# Patient Record
Sex: Male | Born: 1945 | Race: White | Hispanic: No | Smoking: Former smoker
Health system: Southern US, Community
[De-identification: ages and names within clinical notes are randomized; demographics above are authoritative.]

## PROBLEM LIST (undated history)

## (undated) DIAGNOSIS — K649 Unspecified hemorrhoids: Secondary | ICD-10-CM

## (undated) DIAGNOSIS — Z95828 Presence of other vascular implants and grafts: Secondary | ICD-10-CM

## (undated) DIAGNOSIS — I2510 Atherosclerotic heart disease of native coronary artery without angina pectoris: Secondary | ICD-10-CM

## (undated) DIAGNOSIS — N200 Calculus of kidney: Secondary | ICD-10-CM

## (undated) DIAGNOSIS — M4800 Spinal stenosis, site unspecified: Secondary | ICD-10-CM

## (undated) DIAGNOSIS — F411 Generalized anxiety disorder: Secondary | ICD-10-CM

## (undated) DIAGNOSIS — Z87442 Personal history of urinary calculi: Secondary | ICD-10-CM

## (undated) DIAGNOSIS — D649 Anemia, unspecified: Secondary | ICD-10-CM

## (undated) DIAGNOSIS — K56609 Unspecified intestinal obstruction, unspecified as to partial versus complete obstruction: Secondary | ICD-10-CM

## (undated) DIAGNOSIS — M1990 Unspecified osteoarthritis, unspecified site: Secondary | ICD-10-CM

## (undated) DIAGNOSIS — C4490 Unspecified malignant neoplasm of skin, unspecified: Secondary | ICD-10-CM

## (undated) DIAGNOSIS — Z85828 Personal history of other malignant neoplasm of skin: Secondary | ICD-10-CM

## (undated) DIAGNOSIS — N401 Enlarged prostate with lower urinary tract symptoms: Secondary | ICD-10-CM

## (undated) DIAGNOSIS — Z87898 Personal history of other specified conditions: Secondary | ICD-10-CM

## (undated) DIAGNOSIS — J439 Emphysema, unspecified: Secondary | ICD-10-CM

## (undated) DIAGNOSIS — C3490 Malignant neoplasm of unspecified part of unspecified bronchus or lung: Secondary | ICD-10-CM

## (undated) DIAGNOSIS — Z9889 Other specified postprocedural states: Secondary | ICD-10-CM

## (undated) DIAGNOSIS — K5909 Other constipation: Secondary | ICD-10-CM

## (undated) DIAGNOSIS — Z9221 Personal history of antineoplastic chemotherapy: Secondary | ICD-10-CM

## (undated) DIAGNOSIS — R9439 Abnormal result of other cardiovascular function study: Secondary | ICD-10-CM

## (undated) DIAGNOSIS — K5730 Diverticulosis of large intestine without perforation or abscess without bleeding: Secondary | ICD-10-CM

## (undated) DIAGNOSIS — R079 Chest pain, unspecified: Secondary | ICD-10-CM

## (undated) DIAGNOSIS — G4730 Sleep apnea, unspecified: Secondary | ICD-10-CM

## (undated) DIAGNOSIS — Z860101 Personal history of adenomatous and serrated colon polyps: Secondary | ICD-10-CM

## (undated) DIAGNOSIS — K219 Gastro-esophageal reflux disease without esophagitis: Secondary | ICD-10-CM

## (undated) DIAGNOSIS — D509 Iron deficiency anemia, unspecified: Secondary | ICD-10-CM

## (undated) DIAGNOSIS — G4733 Obstructive sleep apnea (adult) (pediatric): Secondary | ICD-10-CM

## (undated) DIAGNOSIS — I10 Essential (primary) hypertension: Secondary | ICD-10-CM

## (undated) DIAGNOSIS — Z8739 Personal history of other diseases of the musculoskeletal system and connective tissue: Secondary | ICD-10-CM

## (undated) DIAGNOSIS — E785 Hyperlipidemia, unspecified: Secondary | ICD-10-CM

## (undated) DIAGNOSIS — R42 Dizziness and giddiness: Secondary | ICD-10-CM

## (undated) DIAGNOSIS — Z86010 Personal history of colonic polyps: Secondary | ICD-10-CM

## (undated) DIAGNOSIS — Z923 Personal history of irradiation: Secondary | ICD-10-CM

## (undated) HISTORY — PX: KNEE CARTILAGE SURGERY: SHX688

## (undated) HISTORY — DX: Chest pain, unspecified: R07.9

## (undated) HISTORY — PX: SKIN CANCER EXCISION: SHX779

## (undated) HISTORY — DX: Atherosclerotic heart disease of native coronary artery without angina pectoris: I25.10

## (undated) HISTORY — PX: CATARACT EXTRACTION W/ INTRAOCULAR LENS  IMPLANT, BILATERAL: SHX1307

## (undated) HISTORY — PX: BELPHAROPTOSIS REPAIR: SHX369

## (undated) HISTORY — DX: Unspecified malignant neoplasm of skin, unspecified: C44.90

## (undated) HISTORY — PX: PROSTATE BIOPSY: SHX241

## (undated) HISTORY — DX: Dizziness and giddiness: R42

## (undated) HISTORY — DX: Unspecified hemorrhoids: K64.9

## (undated) HISTORY — PX: TONSILLECTOMY: SUR1361

## (undated) HISTORY — DX: Abnormal result of other cardiovascular function study: R94.39

## (undated) HISTORY — DX: Unspecified intestinal obstruction, unspecified as to partial versus complete obstruction: K56.609

## (undated) HISTORY — PX: CARDIAC CATHETERIZATION: SHX172

## (undated) HISTORY — DX: Essential (primary) hypertension: I10

## (undated) HISTORY — DX: Anemia, unspecified: D64.9

## (undated) HISTORY — DX: Hyperlipidemia, unspecified: E78.5

## (undated) HISTORY — DX: Spinal stenosis, site unspecified: M48.00

---

## 1993-09-10 HISTORY — PX: KNEE ARTHROSCOPY W/ MENISCAL REPAIR: SHX1877

## 1999-10-04 ENCOUNTER — Encounter: Admission: RE | Admit: 1999-10-04 | Discharge: 1999-10-04 | Payer: Self-pay | Admitting: Specialist

## 1999-10-04 ENCOUNTER — Encounter: Payer: Self-pay | Admitting: Specialist

## 1999-10-05 ENCOUNTER — Encounter: Admission: RE | Admit: 1999-10-05 | Discharge: 1999-10-05 | Payer: Self-pay | Admitting: Specialist

## 1999-10-05 ENCOUNTER — Encounter: Payer: Self-pay | Admitting: Specialist

## 2002-09-10 HISTORY — PX: CATARACT EXTRACTION W/ INTRAOCULAR LENS  IMPLANT, BILATERAL: SHX1307

## 2004-09-13 ENCOUNTER — Ambulatory Visit: Payer: Self-pay | Admitting: Internal Medicine

## 2004-09-25 ENCOUNTER — Ambulatory Visit: Payer: Self-pay | Admitting: Internal Medicine

## 2004-10-06 ENCOUNTER — Ambulatory Visit: Payer: Self-pay | Admitting: Family Medicine

## 2004-10-20 ENCOUNTER — Ambulatory Visit (HOSPITAL_COMMUNITY): Admission: RE | Admit: 2004-10-20 | Discharge: 2004-10-20 | Payer: Self-pay | Admitting: Orthopedic Surgery

## 2005-02-01 ENCOUNTER — Ambulatory Visit: Payer: Self-pay | Admitting: Family Medicine

## 2005-02-06 ENCOUNTER — Ambulatory Visit (HOSPITAL_COMMUNITY): Admission: RE | Admit: 2005-02-06 | Discharge: 2005-02-06 | Payer: Self-pay | Admitting: Family Medicine

## 2005-11-08 HISTORY — PX: ELBOW BURSA SURGERY: SHX615

## 2006-06-06 ENCOUNTER — Encounter (INDEPENDENT_AMBULATORY_CARE_PROVIDER_SITE_OTHER): Payer: Self-pay | Admitting: Specialist

## 2006-06-06 ENCOUNTER — Ambulatory Visit (HOSPITAL_BASED_OUTPATIENT_CLINIC_OR_DEPARTMENT_OTHER): Admission: RE | Admit: 2006-06-06 | Discharge: 2006-06-06 | Payer: Self-pay | Admitting: Orthopedic Surgery

## 2006-06-06 HISTORY — PX: OLECRANON BURSECTOMY: SHX2097

## 2007-08-20 ENCOUNTER — Encounter: Admission: RE | Admit: 2007-08-20 | Discharge: 2007-08-20 | Payer: Self-pay | Admitting: Family Medicine

## 2009-02-23 ENCOUNTER — Encounter: Payer: Self-pay | Admitting: Cardiology

## 2009-03-28 ENCOUNTER — Ambulatory Visit: Payer: Self-pay | Admitting: Cardiology

## 2009-03-28 DIAGNOSIS — R0989 Other specified symptoms and signs involving the circulatory and respiratory systems: Secondary | ICD-10-CM

## 2009-03-28 DIAGNOSIS — I10 Essential (primary) hypertension: Secondary | ICD-10-CM | POA: Insufficient documentation

## 2009-03-28 DIAGNOSIS — R0609 Other forms of dyspnea: Secondary | ICD-10-CM

## 2009-03-28 DIAGNOSIS — E669 Obesity, unspecified: Secondary | ICD-10-CM

## 2009-03-28 DIAGNOSIS — R002 Palpitations: Secondary | ICD-10-CM

## 2009-03-28 HISTORY — DX: Obesity, unspecified: E66.9

## 2009-03-28 HISTORY — DX: Other specified symptoms and signs involving the circulatory and respiratory systems: R09.89

## 2009-03-28 HISTORY — DX: Other forms of dyspnea: R06.09

## 2009-03-28 HISTORY — DX: Palpitations: R00.2

## 2010-02-17 ENCOUNTER — Ambulatory Visit (HOSPITAL_BASED_OUTPATIENT_CLINIC_OR_DEPARTMENT_OTHER): Admission: RE | Admit: 2010-02-17 | Discharge: 2010-02-18 | Payer: Self-pay | Admitting: Urology

## 2010-02-17 HISTORY — PX: TRANSURETHRAL RESECTION OF BLADDER TUMOR: SHX2575

## 2010-11-23 ENCOUNTER — Encounter (INDEPENDENT_AMBULATORY_CARE_PROVIDER_SITE_OTHER): Payer: Self-pay | Admitting: *Deleted

## 2010-11-28 NOTE — Letter (Signed)
Summary: New Patient letter  [facility]  [address] [place], [place] [address]   Phone: [phone]  Fax: [phone]       11/23/2010 MRN: [id number]  Travis Padilla [address] [place], [place]  [address]  Dear Mr. Travis Padilla,  Welcome to the Gastroenterology Division at [facility].    You are scheduled to see Dr.  Russella Dar on 01-04-11 at 10:00A.M. on the 3rd floor at [facility], [address]  We ask that you try to arrive at our office 15 minutes prior to your appointment time to allow for check-in.  We would like you to complete the enclosed self-administered evaluation form prior to your visit and bring it with you on the day of your appointment.  We will review it with you.  Also, please bring a complete list of all your medications or, if you prefer, bring the medication bottles and we will list them.  Please bring your insurance card so that we may make a copy of it.  If your insurance requires a referral to see a specialist, please bring your referral form from your primary care physician.  Co-payments are due at the time of your visit and may be paid by cash, check or credit card.     Your office visit will consist of a consult with your physician (includes a physical exam), any laboratory testing he/she may order, scheduling of any necessary diagnostic testing (e.g. x-ray, ultrasound, CT-scan), and scheduling of a procedure (e.g. Endoscopy, Colonoscopy) if required.  Please allow enough time on your schedule to allow for any/all of these possibilities.    If you cannot keep your appointment, please call [phone] to cancel or reschedule prior to your appointment date.  This allows [place] the opportunity to schedule an appointment for another patient in need of care.  If you do not cancel or reschedule by 5 p.m. the business day prior to your appointment date, you will be charged a $50.00 late cancellation/no-show fee.    Thank you for choosing [facility] for your medical needs.  We appreciate the opportunity to care for you.  Please visit [place] at our website  to learn more about our practice.                     Sincerely,                                                             The Gastroenterology Division

## 2011-01-04 ENCOUNTER — Ambulatory Visit: Payer: Self-pay | Admitting: Gastroenterology

## 2011-01-26 NOTE — Op Note (Signed)
NAME:  Travis Padilla, Travis Padilla NO.:  [id number]   MEDICAL RECORD NO.:  [id number]          PATIENT TYPE:  AMB   LOCATION:  [facility]                         FACILITY:  [facility]   PHYSICIAN:  Deidre Ala, M.D.    DATE OF BIRTH:  1945-12-29   DATE OF PROCEDURE:  06/06/2006  DATE OF DISCHARGE:                                 OPERATIVE REPORT   PREOPERATIVE DIAGNOSIS:  Recurrent large left elbow olecranon bursitis.   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   PROCEDURE:  Revision olecranon bursectomy, radical, of left elbow with  placement of through-to-the-skin mattress sutures to the fascia and closed  over a Jackson-Pratt drain.   SURGEON:  Doristine Section, M.D.   ASSISTANT:  Clarene Reamer, P.A.-C   ANESTHESIA:  General endotracheal.   CULTURES:  Taken of fluid.   SPECIMENS:  Bursal sac for analysis and crystals.   DRAINS:  Jackson-Pratt drain to self-suction.   ESTIMATED BLOOD LOSS:  Minimal, less than 100 mL.   REPLACEMENT:  Without.   TOURNIQUET TIME:  One hour 25 minutes.   PATHOLOGIC FINDINGS AND HISTORY:  Travis Padilla is a [age] year old  male who was sent for consultation by Madilyn Fireman, P.A. at [facility] and Dr. Louanna Raw.  He had had the frustratingly accumulation  of a bursal sac, left elbow, following previous bursal excisions about 6  months prior by another orthopedic surgeon in [place].  There was some  question about infection.  He was seen aspirated and there was no sign of  gross infection.  In any case, the patient does have a history of gout.  We  discussed with the patient the issues involved and felt that he needed a  recurrent excision with some sort of a tack-down procedure, as I have used  in the past, which has eliminated significant recurrence long-term.  At  surgery, we found is a thick, fairly aggressive-looking bursal sac that was  quite large,  measuring about 5 x 6-7 cm.  We excised it completely down to  the fascia and just under the subcutaneous layer.  We removed some gouty  tophi and also placed through-and-through tacking stitches of 2-0 Vicryl  from the skin to the to the fascia, completely surrounding the bursa, and  also centrally, and wove a Jackson-Pratt drain in horseshoe shape from  medial-distal and then around the olecranon and back proximal-lateral and  out through a separate stab hole to self-suction.   PROCEDURE:  With adequate anesthesia obtained using LMA technique, 1 g of  Ancef given IV prophylaxis, the patient was placed in the supine position.  The left upper extremity was prepped from the fingertips to the upper arm in  the standard fashion.  After standard prepping and draping, Esmarch  exsanguination was used.  The tourniquet was let up to 250 mmHg.  The old  incision was then incised, incision deepened sharply with a knife and  hemostasis obtained using the Bovie electrocoagulator.  About 20 mL of  bloody fluid were then removed.  I then did a meticulous dissection under  loupe magnification of the bursa with primarily scissors and then  needlepoint Bovie on cutting cautery.  I resected the entire bursal sac,  making sure that I was away from the ulnar nerve medially and fully removed  the bursal sac down to the fascia.  Bleeding points were cauterized.  I then  progressively closed the skin down to the fascia with a 2-0 Vicryl through-  and-through stitch from the skin to the fascia, back up through the skin out  as a small horizontal mattress suture.  When I had completed this, the  Hemovac drain was woven through it and then another row on either side of  the wound of the through-and-through 2-0 Vicryl stitches and then skin  staples.  JP drain was hooked up to self-suction.  A bulky sterile  compressive dressing was applied with a sling.  The patient, having  tolerated procedure well, was awakened  and taken to recovery room in  satisfactory condition for routine postoperative care, for followup on  Monday.           ______________________________  V. Charlesetta Shanks, M.D.     VEP/MEDQ  D:  06/06/2006  T:  06/08/2006  Job:  [id number]   cc:   [facility] Jalene Mullet  Fax: [phone]

## 2011-02-12 ENCOUNTER — Encounter: Payer: Self-pay | Admitting: Physician Assistant

## 2011-09-11 DIAGNOSIS — I2510 Atherosclerotic heart disease of native coronary artery without angina pectoris: Secondary | ICD-10-CM

## 2011-09-11 HISTORY — DX: Atherosclerotic heart disease of native coronary artery without angina pectoris: I25.10

## 2012-06-11 DIAGNOSIS — M23359 Other meniscus derangements, posterior horn of lateral meniscus, unspecified knee: Secondary | ICD-10-CM | POA: Diagnosis not present

## 2012-06-11 DIAGNOSIS — M23302 Other meniscus derangements, unspecified lateral meniscus, unspecified knee: Secondary | ICD-10-CM | POA: Diagnosis not present

## 2012-06-11 DIAGNOSIS — M23305 Other meniscus derangements, unspecified medial meniscus, unspecified knee: Secondary | ICD-10-CM | POA: Diagnosis not present

## 2012-06-11 DIAGNOSIS — M2240 Chondromalacia patellae, unspecified knee: Secondary | ICD-10-CM | POA: Diagnosis not present

## 2012-06-11 DIAGNOSIS — M779 Enthesopathy, unspecified: Secondary | ICD-10-CM | POA: Diagnosis not present

## 2012-07-21 DIAGNOSIS — Z0000 Encounter for general adult medical examination without abnormal findings: Secondary | ICD-10-CM | POA: Diagnosis not present

## 2012-07-21 DIAGNOSIS — K589 Irritable bowel syndrome without diarrhea: Secondary | ICD-10-CM | POA: Diagnosis not present

## 2012-07-21 DIAGNOSIS — I10 Essential (primary) hypertension: Secondary | ICD-10-CM | POA: Diagnosis not present

## 2012-07-21 DIAGNOSIS — E785 Hyperlipidemia, unspecified: Secondary | ICD-10-CM | POA: Diagnosis not present

## 2012-07-21 DIAGNOSIS — Z23 Encounter for immunization: Secondary | ICD-10-CM | POA: Diagnosis not present

## 2012-07-21 DIAGNOSIS — M109 Gout, unspecified: Secondary | ICD-10-CM | POA: Diagnosis not present

## 2012-07-21 DIAGNOSIS — Z125 Encounter for screening for malignant neoplasm of prostate: Secondary | ICD-10-CM | POA: Diagnosis not present

## 2012-08-14 ENCOUNTER — Encounter: Payer: Self-pay | Admitting: Cardiology

## 2012-08-14 ENCOUNTER — Ambulatory Visit (INDEPENDENT_AMBULATORY_CARE_PROVIDER_SITE_OTHER): Payer: Medicare Other | Admitting: Physician Assistant

## 2012-08-14 DIAGNOSIS — R9439 Abnormal result of other cardiovascular function study: Secondary | ICD-10-CM

## 2012-08-14 DIAGNOSIS — R079 Chest pain, unspecified: Secondary | ICD-10-CM

## 2012-08-14 NOTE — Progress Notes (Signed)
Travis Padilla is a 66 y.o. male referred by his PCP for ETT.  He has no hx of CAD.  PMH:  HTN, HL.  SHx: ex-smoker.  FHx:  + CAD (father with CABG age 74).  Patient notes hx of occasional chest pain and L arm pain. Has noted pain with exertion.  Denies any recent exertional chest pain.  No significant dyspnea.  No syncope.  Exam unremarkable.   Exercise Treadmill Test  Pre-Exercise Testing Evaluation Rhythm: normal sinus  Rate: 78                 Test  Exercise Tolerance Test Ordering MD: Rudi Heap, MD  Interpreting MD: Tereso Newcomer, PA-C  Unique Test No: 1  Treadmill:  1  Indication for ETT: chest pain - rule out ischemia  Contraindication to ETT: No   Stress Modality: exercise - treadmill  Cardiac Imaging Performed: non   Protocol: standard Bruce - maximal  Max BP:  223/93  Max MPHR (bpm):  155 85% MPR (bpm):  132  MPHR obtained (bpm):  144 % MPHR obtained:  92  Reached 85% MPHR (min:sec):  3:50 Total Exercise Time (min-sec):  5:00  Workload in METS:  7.0 Borg Scale: 13  Reason ETT Terminated:  exaggerated hypertensive response    ST Segment Analysis At Rest: normal ST segments - no evidence of significant ST depression With Exercise: borderline ST changes  Other Information Arrhythmia:  No Angina during ETT:  present (1) Quality of ETT:  indeterminate  ETT Interpretation:  borderline (indeterminate) with non-specific ST changes  Comments: Fair exercise tolerance. There was chest pain.  He also noted knee pain. Exaggerated BP response to exercise. Subtle borderline ST changes in V4-5. Cannot rule out ischemia.   Recommendations: ETT is clinically + and Electrically equivocal. Recommend Lexiscan Myoview. Discussed with patient. He agrees to proceed. We will schedule a Lexiscan (low level stress) Myoview. Luna Glasgow, PA-C  12:48 PM 08/14/2012

## 2012-08-14 NOTE — Patient Instructions (Addendum)
Your physician has requested that you have a lexiscan myoview. For further information please visit [email or web address]. Please follow instruction sheet, as given.   

## 2012-08-19 ENCOUNTER — Encounter (HOSPITAL_COMMUNITY): Payer: Managed Care, Other (non HMO)

## 2012-08-21 ENCOUNTER — Telehealth: Payer: Self-pay | Admitting: *Deleted

## 2012-08-21 ENCOUNTER — Ambulatory Visit (INDEPENDENT_AMBULATORY_CARE_PROVIDER_SITE_OTHER): Payer: Medicare Other | Admitting: Cardiology

## 2012-08-21 ENCOUNTER — Ambulatory Visit (HOSPITAL_COMMUNITY): Payer: Medicare Other | Attending: Internal Medicine | Admitting: Radiology

## 2012-08-21 ENCOUNTER — Encounter: Payer: Self-pay | Admitting: Cardiology

## 2012-08-21 VITALS — BP 132/82 | Ht 74.0 in | Wt 246.0 lb

## 2012-08-21 VITALS — BP 126/72 | HR 62 | Resp 12

## 2012-08-21 DIAGNOSIS — I999 Unspecified disorder of circulatory system: Secondary | ICD-10-CM | POA: Insufficient documentation

## 2012-08-21 DIAGNOSIS — R9439 Abnormal result of other cardiovascular function study: Secondary | ICD-10-CM | POA: Insufficient documentation

## 2012-08-21 DIAGNOSIS — Z8249 Family history of ischemic heart disease and other diseases of the circulatory system: Secondary | ICD-10-CM | POA: Insufficient documentation

## 2012-08-21 DIAGNOSIS — R079 Chest pain, unspecified: Secondary | ICD-10-CM

## 2012-08-21 DIAGNOSIS — I10 Essential (primary) hypertension: Secondary | ICD-10-CM | POA: Diagnosis not present

## 2012-08-21 DIAGNOSIS — R002 Palpitations: Secondary | ICD-10-CM

## 2012-08-21 LAB — BASIC METABOLIC PANEL
BUN: 13 mg/dL (ref 6–23)
Calcium: 9.2 mg/dL (ref 8.4–10.5)
Chloride: 103 mEq/L (ref 96–112)
Potassium: 3.9 mEq/L (ref 3.5–5.1)
Sodium: 136 mEq/L (ref 135–145)

## 2012-08-21 LAB — CBC WITH DIFFERENTIAL/PLATELET
Basophils Absolute: 0 10*3/uL (ref 0.0–0.1)
Hemoglobin: 13.4 g/dL (ref 13.0–17.0)
Lymphs Abs: 1.7 10*3/uL (ref 0.7–4.0)
MCV: 90.9 fl (ref 78.0–100.0)
RDW: 13.7 % (ref 11.5–14.6)

## 2012-08-21 LAB — PROTIME-INR: Prothrombin Time: 11.7 s (ref 10.2–12.4)

## 2012-08-21 MED ORDER — TECHNETIUM TC 99M SESTAMIBI GENERIC - CARDIOLITE
30.0000 | Freq: Once | INTRAVENOUS | Status: AC | PRN
  Administered 2012-08-21: 30 via INTRAVENOUS

## 2012-08-21 MED ORDER — ALPRAZOLAM 0.25 MG PO TABS: ORAL_TABLET | ORAL | Status: DC

## 2012-08-21 MED ORDER — REGADENOSON 0.4 MG/5ML IV SOLN
0.4000 mg | Freq: Once | INTRAVENOUS | Status: AC
  Administered 2012-08-21: 0.4 mg via INTRAVENOUS

## 2012-08-21 MED ORDER — TECHNETIUM TC 99M SESTAMIBI GENERIC - CARDIOLITE
10.0000 | Freq: Once | INTRAVENOUS | Status: AC | PRN
  Administered 2012-08-21: 10 via INTRAVENOUS

## 2012-08-21 MED ORDER — AMINOPHYLLINE 25 MG/ML IV SOLN
75.0000 mg | Freq: Once | INTRAVENOUS | Status: AC
  Administered 2012-08-21: 75 mg via INTRAVENOUS

## 2012-08-21 NOTE — Patient Instructions (Addendum)
Your cath has been scheduled for 08/26/12.  Please refer to your letter for instructions.  Your physician recommends that you return for lab work in: today (bmet, cbc, pt/inr)  Your physician has recommended you make the following change in your medication: START Alprazolam 0.25mg  as needed up to twice daily

## 2012-08-21 NOTE — Progress Notes (Signed)
HPI   The patient was referred by his primary team for standard stress testing. The patient has had some palpitations and some vague chest discomfort. His standard treadmill was abnormal. He was brought back to have a nuclear study. Today he underwent a Lexiscan nuclear stress test with low level of exercise. There was no diagnostic EKG change. The nuclear images are abnormal. The nuclear technician brought the images to me and we added the patient on to my schedule. I spent greater than one hour reviewing the images in talking with the patient and examining the patient. The patient's wife was present. More than 30 minutes of time was spent counseling the patient in talking about the approach to his care.  The patient was a former smoker. There is a history of hypertension. A statin was recommended by his primary physician but I do not know his lipids. There is no history of diabetes. There is a family history of heart disease.  Not on File  Current Outpatient Prescriptions  Medication Sig Dispense Refill  . amLODipine (NORVASC) 10 MG tablet Take 1 tablet (10 mg total) by mouth daily.      . febuxostat (ULORIC) 40 MG tablet Take 1 tablet (40 mg total) by mouth daily.      Marland Kitchen ALPRAZolam (XANAX) 0.25 MG tablet Take one tablet by mouth as needed up to twice daily for anxiety  20 tablet  0    History   Social History  . Marital Status: Married    Spouse Name: N/A    Number of Children: 1  . Years of Education: N/A   Occupational History  . Emergency planning/management officer    Social History Main Topics  . Smoking status: Former Smoker    Types: Cigarettes  . Smokeless tobacco: Never Used  . Alcohol Use: No  . Drug Use: No  . Sexually Active: Not on file   Other Topics Concern  . Not on file   Social History Narrative  . No narrative on file    Family History  Problem Relation Age of Onset  . Heart attack Father     Past Medical History  Diagnosis Date  . Hypertension   . Gout   . Fungus  infection     in ear   . Hemorrhoid   . Dizziness   . Racing heart beat   . Chest pain      December, 2013  . Abnormal nuclear stress test     December, 2013    Past Surgical History  Procedure Date  . Knee cartilage surgery     Left knee    Patient Active Problem List  Diagnosis  . OBESITY, UNSPECIFIED  . ESSENTIAL HYPERTENSION, BENIGN  . PALPITATIONS  . SNORING  . Chest pain  . Abnormal nuclear stress test    ROS   Patient denies fever, chills, headache, sweats, rash, change in vision, change in hearing, cough, nausea vomiting, urinary symptoms. All other systems are reviewed and are negative.    PHYSICAL EXAM   Patient is oriented to person time and place. Affect is normal. Wife was in the room. There is no jugulovenous distention. There are no carotid bruits. Lungs are clear. Respiratory effort is nonlabored. Cardiac exam reveals S1 and S2. There no clicks or significant murmurs. The abdomen is soft. There is no peripheral edema. There no musculoskeletal deformities. There are no skin rashes.  Filed Vitals:   08/21/12 1346  BP: 126/72  Pulse: 62  Resp:  12   EKG is done in the nuclear lab. I have reviewed the images. There is no marked abnormality.  ASSESSMENT & PLAN

## 2012-08-21 NOTE — Assessment & Plan Note (Signed)
The patient has some chest discomfort that goes into his left arm. It is not with exertion. It is somewhat random. It seems unusual for angina.

## 2012-08-21 NOTE — Assessment & Plan Note (Signed)
The patient mentions some palpitations. At this point it does not seem that he has any marked clinical arrhythmias. We will assess this further in the future.

## 2012-08-21 NOTE — Telephone Encounter (Signed)
Pt here for nuclear stress test. He gave information to check in regarding medication list and would like list updated.  He wrote on list that he is on amlodipine 10 mg daily and Uloric 40 mg daily. Will update list

## 2012-08-21 NOTE — Assessment & Plan Note (Addendum)
The patient had a stress nuclear scan today. The images are abnormal. There is moderate decreased activity in the anterior wall from the mid ventricle to the apex. The degree of photon reduction is moderate. The size of the defect is moderate. There is complete reversibility. This is consistent with ischemia most probably in the distribution of the LAD. I reviewed these images carefully with the patient and his wife. I explained to him that this probably represents narrowing of one of his coronary arteries. I explained that I felt that it was important to proceed with cardiac catheterization to assess his anatomy further. The patient does not have any classic symptoms. It is possible that he has significant silent ischemia. I discussed the procedure with him and his wife at great length. They would like to proceed.  As part of his evaluation I have completed the full evaluation. In addition orders are being written for the cardiac catheterization.

## 2012-08-21 NOTE — Progress Notes (Signed)
[facility] SITE 3 NUCLEAR MED [address] [place], [place] [address] [phone]    Cardiology Nuclear Med Study  Travis Padilla is a 66 y.o. male     MRN : [id number]     DOB: 1946/07/18  Procedure Date: 08/21/2012  Nuclear Med Background Indication for Stress Test:  Evaluation for Ischemia History:  08/14/12 GXT: Non Specific ST T changes Cardiac Risk Factors: Family History - CAD, History of Smoking, Hypertension and Lipids  Symptoms:  Chest Pain   Nuclear Pre-Procedure Caffeine/Decaff Intake:  None NPO After: 7:30pm   Lungs:  clear O2 Sat: 98% on room air. IV 0.9% NS with Angio Cath:  20g  IV Site: R Hand  IV Started by:  Cathlyn Parsons, RN  Chest Size (in):  44 Cup Size: n/a  Height: 6\' 2"  (1.88 m)  Weight:  246 lb (111.585 kg)  BMI:  Body mass index is 31.58 kg/(m^2). Tech Comments:  n/a    Nuclear Med Study 1 or 2 day study: 1 day  Stress Test Type:  Treadmill/Lexiscan  Reading MD:  Leodis Sias MD Order Authorizing Provider:  Fransico Meadow  Resting Radionuclide: Technetium 22m Sestamibi  Resting Radionuclide Dose: 11.0 mCi   Stress Radionuclide:  Technetium 86m Sestamibi  Stress Radionuclide Dose: 33.0 mCi           Stress Protocol Rest HR: 82 Stress HR: 114  Rest BP: 132/82 Stress BP: 185/88  Exercise Time (min): n/a METS: n/a   Predicted Max HR: 155 bpm % Max HR: 73.55 bpm Rate Pressure Product: 21308    Dose of Adenosine (mg):  n/a Dose of Lexiscan: 0.4 mg  Dose of Atropine (mg): n/a Dose of Dobutamine: n/a mcg/kg/min (at max HR)  Stress Test Technologist: Milana Na, EMT-P  Nuclear Technologist:  Doyne Keel, CNMT     Rest Procedure:  Myocardial perfusion imaging was performed at rest 45 minutes following the intravenous administration of Technetium 59m Sestamibi. Rest ECG: NSR - Normal EKG  Stress Procedure:  The patient received IV Lexiscan 0.4 mg over 15-seconds with concurrent low level exercise and then Technetium  60m Sestamibi was injected at 30-seconds while the patient continued walking one more minute. He was very lt. headed with [place] and had to be reversed with Aminophylline 75 mg. All symptoms were resolved. Quantitative spect images were obtained after a 45-minute delay. Stress ECG: No significant change from baseline ECG  QPS Raw Data Images:  Normal; no motion artifact; normal heart/lung ratio. Stress Images:  There is a medium sized severe defect of the apex and  apical lateral segments with normal uptake in the remaing areas.   Rest Images:  There is a very small area of very mild attenuation of the apical lateral segments with normal uptake in the remaining segments. Subtraction (SDS):  There is evidence of apical and apical lateral ischemia. Transient Ischemic Dilatation (Normal <1.22):  1.11 Lung/Heart Ratio (Normal <0.45):  0.28  Quantitative Gated Spect Images QGS EDV:  167 ml QGS ESV:  92 ml  Impression Exercise Capacity:  Fair exercise capacity. BP Response:  Hypertensive blood pressure response. Clinical Symptoms:  No significant symptoms noted. ECG Impression:  No significant ST segment change suggestive of ischemia. Comparison with Prior Nuclear Study: No images to compare  Overall Impression:  High risk stress nuclear study.  There is apical and apical lateral ischemia.    LV Ejection Fraction: 45%.  LV Wall Motion:  The overall LV function is mildly depressed.  There is global hypokinesis.    Vesta Mixer, Montez Hageman., MD, [facility] 08/21/2012, 4:31 PM Office - [phone] Pager [phone]

## 2012-08-21 NOTE — Assessment & Plan Note (Signed)
Patient blood pressure is treated at this time. No change in therapy.

## 2012-08-21 NOTE — H&P (Signed)
Travis Holleman, MD 08/21/2012 4:32 PM Pended  HPI  The patient was referred by his primary team for standard stress testing. The patient has had some palpitations and some vague chest discomfort. His standard treadmill was abnormal. He was brought back to have a nuclear study. Today he underwent a Lexiscan nuclear stress test with low level of exercise. There was no diagnostic EKG change. The nuclear images are abnormal. The nuclear technician brought the images to me and we added the patient on to my schedule. I spent greater than one hour reviewing the images in talking with the patient and examining the patient. The patient's wife was present. More than 30 minutes of time was spent counseling the patient in talking about the approach to his care.  The patient was a former smoker. There is a history of hypertension. A statin was recommended by his primary physician but I do not know his lipids. There is no history of diabetes. There is a family history of heart disease.  Not on File  Current Outpatient Prescriptions   Medication  Sig  Dispense  Refill   .  amLODipine (NORVASC) 10 MG tablet  Take 1 tablet (10 mg total) by mouth daily.     .  febuxostat (ULORIC) 40 MG tablet  Take 1 tablet (40 mg total) by mouth daily.     .  ALPRAZolam (XANAX) 0.25 MG tablet  Take one tablet by mouth as needed up to twice daily for anxiety  20 tablet  0    History    Social History   .  Marital Status:  Married     Spouse Name:  N/A     Number of Children:  1   .  Years of Education:  N/A    Occupational History   .  project manager     Social History Main Topics   .  Smoking status:  Former Smoker     Types:  Cigarettes   .  Smokeless tobacco:  Never Used   .  Alcohol Use:  No   .  Drug Use:  No   .  Sexually Active:  Not on file    Other Topics  Concern   .  Not on file    Social History Narrative   .  No narrative on file    Family History   Problem  Relation  Age of Onset   .  Heart attack   Father     Past Medical History   Diagnosis  Date   .  Hypertension    .  Gout    .  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  Hemorrhoid    .  Dizziness    .  Racing heart beat    .  Chest pain      December, 2013   .  Abnormal nuclear stress test      December, 2013    Past Surgical History   Procedure  Date   .  Knee cartilage surgery      Left knee    Patient Active Problem List   Diagnosis   .  OBESITY, UNSPECIFIED   .  ESSENTIAL HYPERTENSION, BENIGN   .  PALPITATIONS   .  SNORING   .  Chest pain   .  Abnormal nuclear stress test    ROS  Patient denies fever, chills, headache, sweats, rash, change in vision, change in hearing, cough, nausea   vomiting, urinary symptoms. All other systems are reviewed and are negative.  PHYSICAL EXAM  Patient is oriented to person time and place. Affect is normal. Wife was in the room. There is no jugulovenous distention. There are no carotid bruits. Lungs are clear. Respiratory effort is nonlabored. Cardiac exam reveals S1 and S2. There no clicks or significant murmurs. The abdomen is soft. There is no peripheral edema. There no musculoskeletal deformities. There are no skin rashes.  Filed Vitals:    08/21/12 1346   BP:  126/72   Pulse:  62   Resp:  12    EKG is done in the nuclear lab. I have reviewed the images. There is no marked abnormality.  ASSESSMENT & PLAN   ESSENTIAL HYPERTENSION, BENIGN - Latravia Southgate, MD 08/21/2012 4:30 PM Signed  Patient blood pressure is treated at this time. No change in therapy. PALPITATIONS - Hargun Spurling, MD 08/21/2012 4:30 PM Signed  The patient mentions some palpitations. At this point it does not seem that he has any marked clinical arrhythmias. We will assess this further in the future. Chest pain - Shanayah Kaffenberger, MD 08/21/2012 4:31 PM Signed  The patient has some chest discomfort that goes into his left arm. It is not with exertion. It is somewhat random. It seems unusual for angina. Abnormal nuclear  stress test - Ramone Gander, MD 08/21/2012 4:37 PM Addendum  The patient had a stress nuclear scan today. The images are abnormal. There is moderate decreased activity in the anterior wall from the mid ventricle to the apex. The degree of photon reduction is moderate. The size of the defect is moderate. There is complete reversibility. This is consistent with ischemia most probably in the distribution of the LAD. I reviewed these images carefully with the patient and his wife. I explained to him that this probably represents narrowing of one of his coronary arteries. I explained that I felt that it was important to proceed with cardiac catheterization to assess his anatomy further. The patient does not have any classic symptoms. It is possible that he has significant silent ischemia. I discussed the procedure with him and his wife at great length. They would like to proceed.  As part of his evaluation I have completed the full evaluation. In addition orders are being written for the cardiac catheterization.  Jeff Heidy Mccubbin, MD   

## 2012-08-26 ENCOUNTER — Encounter (HOSPITAL_BASED_OUTPATIENT_CLINIC_OR_DEPARTMENT_OTHER)
Admission: RE | Disposition: A | Discharge status: Home / Self Care | Payer: Self-pay | Source: Ambulatory Visit | Attending: Cardiovascular Disease

## 2012-08-26 ENCOUNTER — Inpatient Hospital Stay (HOSPITAL_BASED_OUTPATIENT_CLINIC_OR_DEPARTMENT_OTHER)
Admission: RE | Admit: 2012-08-26 | Discharge: 2012-08-26 | Disposition: A | Discharge status: Home / Self Care | Payer: Medicare Other | Source: Ambulatory Visit | Attending: Cardiovascular Disease | Admitting: Cardiovascular Disease

## 2012-08-26 DIAGNOSIS — R079 Chest pain, unspecified: Secondary | ICD-10-CM | POA: Insufficient documentation

## 2012-08-26 DIAGNOSIS — R9439 Abnormal result of other cardiovascular function study: Secondary | ICD-10-CM | POA: Diagnosis not present

## 2012-08-26 DIAGNOSIS — E669 Obesity, unspecified: Secondary | ICD-10-CM | POA: Diagnosis not present

## 2012-08-26 DIAGNOSIS — I10 Essential (primary) hypertension: Secondary | ICD-10-CM | POA: Diagnosis not present

## 2012-08-26 DIAGNOSIS — R002 Palpitations: Secondary | ICD-10-CM | POA: Insufficient documentation

## 2012-08-26 DIAGNOSIS — I2510 Atherosclerotic heart disease of native coronary artery without angina pectoris: Secondary | ICD-10-CM | POA: Diagnosis not present

## 2012-08-26 SURGERY — JV LEFT HEART CATHETERIZATION WITH CORONARY ANGIOGRAM
Anesthesia: Moderate Sedation

## 2012-08-26 MED ORDER — SODIUM CHLORIDE 0.9 % IV SOLN
INTRAVENOUS | Status: DC
  Administered 2012-08-26: 09:00:00 via INTRAVENOUS

## 2012-08-26 MED ORDER — SODIUM CHLORIDE 0.9 % IV SOLN: 250.0000 mL | INTRAVENOUS | Status: DC | PRN

## 2012-08-26 MED ORDER — DIAZEPAM 5 MG PO TABS
5.0000 mg | ORAL_TABLET | ORAL | Status: AC
  Administered 2012-08-26: 5 mg via ORAL

## 2012-08-26 MED ORDER — ASPIRIN 81 MG PO CHEW
324.0000 mg | CHEWABLE_TABLET | ORAL | Status: AC
  Administered 2012-08-26: 324 mg via ORAL

## 2012-08-26 MED ORDER — SODIUM CHLORIDE 0.9 % IV SOLN: INTRAVENOUS | Status: AC

## 2012-08-26 MED ORDER — ONDANSETRON HCL 4 MG/2ML IJ SOLN: 4.0000 mg | Freq: Four times a day (QID) | INTRAMUSCULAR | Status: DC | PRN

## 2012-08-26 MED ORDER — ACETAMINOPHEN 325 MG PO TABS: 650.0000 mg | ORAL_TABLET | ORAL | Status: DC | PRN

## 2012-08-26 MED ORDER — SODIUM CHLORIDE 0.9 % IJ SOLN: 3.0000 mL | INTRAMUSCULAR | Status: DC | PRN

## 2012-08-26 MED ORDER — SODIUM CHLORIDE 0.9 % IJ SOLN: 3.0000 mL | Freq: Two times a day (BID) | INTRAMUSCULAR | Status: DC

## 2012-08-26 NOTE — Progress Notes (Signed)
Attempted to let 3 cc's of air out of TR band, but right radial site began bleeding.  3cc of air reapplied to tr band.

## 2012-08-26 NOTE — CV Procedure (Signed)
    Cardiac Catheterization Operative Report  Travis Padilla [id number] 12/17/201310:21 AM Horald Pollen., PA  Procedure Performed:  1. Left Heart Catheterization 2. Selective Coronary Angiography 3. Left ventricular angiogram  Operator: Verne Carrow, MD  Arterial access site:  Right radial artery.   Indication:  66 yo white male with history of  HTN and former tobacco abuse with recent c/o chest pain. Stress myoview with possible anterior wall and apical ischemia.                                   Procedure Details: The risks, benefits, complications, treatment options, and expected outcomes were discussed with the patient. The patient and/or family concurred with the proposed plan, giving informed consent. The patient was brought to the cath lab after IV hydration was begun and oral premedication was given. The patient was further sedated with Versed and Fentanyl. The right wrist was assessed with an Allens test which was positive. The right wrist was prepped and draped in a sterile fashion. 1% lidocaine was used for local anesthesia. Using the modified Seldinger access technique, a 5 French sheath was placed in the right radial artery. 3 mg Verapamil was given through the sheath. 5000 units IV heparin was given. Standard diagnostic catheters were used to perform selective coronary angiography. A pigtail catheter was used to perform a left ventricular angiogram. The sheath was removed from the right radial artery and a Terumo hemostasis band was applied at the arteriotomy site on the right wrist.    There were no immediate complications. The patient was taken to the recovery area in stable condition.   Hemodynamic Findings: Central aortic pressure: 136/67 Left ventricular pressure:136/11/16  Angiographic Findings:  Left main: 10% mid stenosis.   Left Anterior Descending Artery: Moderate caliber vessel that courses to the apex. The proximal vessel has diffuse 30-40% stenosis.  This does not appear to be flow limiting. The mid and distal vessel has no significant plaque disease. There are several very small caliber diagonal branches.   Intermediate Artery:  Small to moderate caliber vessel with mild plaque disease.   Circumflex Artery: Moderate caliber vessel with 20% proximal stenosis. There is a moderate caliber first obtuse marginal branch with mild plaque disease. The mid and distal AV groove Circumflex is small in caliber and has no flow limiting lesions.   Right Coronary Artery: Moderate caliber dominant vessel with 30% proximal stenosis. 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 This appears to have an adequate luminal area and does not appear to be flow limiting. The distal vessel has luminal irregularities. The PDA and posterolateral branch are small in caliber.   Left Ventricular Angiogram: LVEF=50%.   Impression: 1. Moderate non-obstructive CAD 2. Low normal LV systolic function  Recommendations:Medical management of CAD with aggressive risk factor reduction.        Complications:  None. The patient tolerated the procedure well.

## 2012-08-26 NOTE — Progress Notes (Signed)
Positive Allen's test done on right hand, spo2 96%.

## 2012-08-26 NOTE — Interval H&P Note (Signed)
History and Physical Interval Note:  08/26/2012 9:33 AM  Travis Padilla  has presented today for cardiac cath  with the diagnosis of abnormal stress test.  The various methods of treatment have been discussed with the patient and family. After consideration of risks, benefits and other options for treatment, the patient has consented to  Procedure(s) (LRB) with comments: JV LEFT HEART CATHETERIZATION WITH CORONARY ANGIOGRAM (N/A) as a surgical intervention .  The patient's history has been reviewed, patient examined, no change in status, stable for surgery.  I have reviewed the patient's chart and labs.  Questions were answered to the patient's satisfaction.     Novella Abraha

## 2012-08-26 NOTE — H&P (View-Only) (Signed)
Willa Rough, MD 08/21/2012 4:32 PM Pended  HPI  The patient was referred by his primary team for standard stress testing. The patient has had some palpitations and some vague chest discomfort. His standard treadmill was abnormal. He was brought back to have a nuclear study. Today he underwent a Lexiscan nuclear stress test with low level of exercise. There was no diagnostic EKG change. The nuclear images are abnormal. The nuclear technician brought the images to me and we added the patient on to my schedule. I spent greater than one hour reviewing the images in talking with the patient and examining the patient. The patient's wife was present. More than 30 minutes of time was spent counseling the patient in talking about the approach to his care.  The patient was a former smoker. There is a history of hypertension. A statin was recommended by his primary physician but I do not know his lipids. There is no history of diabetes. There is a family history of heart disease.  Not on File  Current Outpatient Prescriptions   Medication  Sig  Dispense  Refill   .  amLODipine (NORVASC) 10 MG tablet  Take 1 tablet (10 mg total) by mouth daily.     .  febuxostat (ULORIC) 40 MG tablet  Take 1 tablet (40 mg total) by mouth daily.     Marland Kitchen  ALPRAZolam (XANAX) 0.25 MG tablet  Take one tablet by mouth as needed up to twice daily for anxiety  20 tablet  0    History    Social History   .  Marital Status:  Married     Spouse Name:  N/A     Number of Children:  1   .  Years of Education:  N/A    Occupational History   .  Emergency planning/management officer     Social History Main Topics   .  Smoking status:  Former Smoker     Types:  Cigarettes   .  Smokeless tobacco:  Never Used   .  Alcohol Use:  No   .  Drug Use:  No   .  Sexually Active:  Not on file    Other Topics  Concern   .  Not on file    Social History Narrative   .  No narrative on file    Family History   Problem  Relation  Age of Onset   .  Heart attack   Father     Past Medical History   Diagnosis  Date   .  Hypertension    .  Gout    .  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  Hemorrhoid    .  Dizziness    .  Racing heart beat    .  Chest pain      December, 2013   .  Abnormal nuclear stress test      December, 2013    Past Surgical History   Procedure  Date   .  Knee cartilage surgery      Left knee    Patient Active Problem List   Diagnosis   .  OBESITY, UNSPECIFIED   .  ESSENTIAL HYPERTENSION, BENIGN   .  PALPITATIONS   .  SNORING   .  Chest pain   .  Abnormal nuclear stress test    ROS  Patient denies fever, chills, headache, sweats, rash, change in vision, change in hearing, cough, nausea  vomiting, urinary symptoms. All other systems are reviewed and are negative.  PHYSICAL EXAM  Patient is oriented to person time and place. Affect is normal. Wife was in the room. There is no jugulovenous distention. There are no carotid bruits. Lungs are clear. Respiratory effort is nonlabored. Cardiac exam reveals S1 and S2. There no clicks or significant murmurs. The abdomen is soft. There is no peripheral edema. There no musculoskeletal deformities. There are no skin rashes.  Filed Vitals:    08/21/12 1346   BP:  126/72   Pulse:  62   Resp:  12    EKG is done in the nuclear lab. I have reviewed the images. There is no marked abnormality.  ASSESSMENT & PLAN   ESSENTIAL HYPERTENSION, BENIGN - Willa Rough, MD 08/21/2012 4:30 PM Signed  Patient blood pressure is treated at this time. No change in therapy. PALPITATIONS - Willa Rough, MD 08/21/2012 4:30 PM Signed  The patient mentions some palpitations. At this point it does not seem that he has any marked clinical arrhythmias. We will assess this further in the future. Chest pain - Willa Rough, MD 08/21/2012 4:31 PM Signed  The patient has some chest discomfort that goes into his left arm. It is not with exertion. It is somewhat random. It seems unusual for angina. Abnormal nuclear  stress test - Willa Rough, MD 08/21/2012 4:37 PM Addendum  The patient had a stress nuclear scan today. The images are abnormal. There is moderate decreased activity in the anterior wall from the mid ventricle to the apex. The degree of photon reduction is moderate. The size of the defect is moderate. There is complete reversibility. This is consistent with ischemia most probably in the distribution of the LAD. I reviewed these images carefully with the patient and his wife. I explained to him that this probably represents narrowing of one of his coronary arteries. I explained that I felt that it was important to proceed with cardiac catheterization to assess his anatomy further. The patient does not have any classic symptoms. It is possible that he has significant silent ischemia. I discussed the procedure with him and his wife at great length. They would like to proceed.  As part of his evaluation I have completed the full evaluation. In addition orders are being written for the cardiac catheterization.  Jerral Bonito, MD

## 2012-08-26 NOTE — Progress Notes (Signed)
TR band removed, tegaderm and gauze dressing, and right wrist immobilizer applied, site level 0.

## 2012-10-01 ENCOUNTER — Encounter: Payer: Self-pay | Admitting: Cardiology

## 2012-10-01 ENCOUNTER — Ambulatory Visit (INDEPENDENT_AMBULATORY_CARE_PROVIDER_SITE_OTHER): Payer: Medicare Other | Admitting: Cardiology

## 2012-10-01 VITALS — BP 125/80 | HR 65 | Ht 75.0 in | Wt 255.0 lb

## 2012-10-01 DIAGNOSIS — R9439 Abnormal result of other cardiovascular function study: Secondary | ICD-10-CM | POA: Diagnosis not present

## 2012-10-01 DIAGNOSIS — I10 Essential (primary) hypertension: Secondary | ICD-10-CM | POA: Diagnosis not present

## 2012-10-01 DIAGNOSIS — R079 Chest pain, unspecified: Secondary | ICD-10-CM

## 2012-10-01 DIAGNOSIS — E669 Obesity, unspecified: Secondary | ICD-10-CM | POA: Diagnosis not present

## 2012-10-01 DIAGNOSIS — R002 Palpitations: Secondary | ICD-10-CM

## 2012-10-01 MED ORDER — AMLODIPINE BESYLATE 5 MG PO TABS: 5.0000 mg | ORAL_TABLET | Freq: Every day | ORAL | Status: DC

## 2012-10-01 MED ORDER — ASPIRIN EC 81 MG PO TBEC: 81.0000 mg | DELAYED_RELEASE_TABLET | Freq: Every day | ORAL | Status: DC

## 2012-10-01 NOTE — Patient Instructions (Addendum)
Please decrease Norvasc to  5 mg a day And Asprin to 81 mg a day Continue all other medications as listed  Follow up in 6 months with Dr [name].  You will receive a letter in the mail 2 months before you are due.  Please call us when you receive this letter to schedule your follow up appointment.

## 2012-10-01 NOTE — Progress Notes (Signed)
HPI The patient presents for followup of chest pain. He was evaluated in December with a stress perfusion study which suggested LAD ischemia. However, followup cardiac catheterization demonstrated only mild nonobstructive plaque.  Since his catheterization he's had no new complaints. He has some fleeting chest pain shooting left arm pain is quite atypical. He has some rare palpitations but no presyncope or syncope. He has no substernal chest pressure or neck discomfort. He has no new shortness of breath, PND or orthopnea. He is exercising routinely.  No Known Allergies  Current Outpatient Prescriptions  Medication Sig Dispense Refill  . amLODipine (NORVASC) 10 MG tablet Take 1 tablet (10 mg total) by mouth daily.      . carvedilol (COREG) 3.125 MG tablet Take 3.125 mg by mouth 2 (two) times daily with a meal.      . febuxostat (ULORIC) 40 MG tablet Take 1 tablet (40 mg total) by mouth daily.      . Multiple Vitamin (MULTIVITAMIN) capsule Take 1 capsule by mouth daily.      . pravastatin (PRAVACHOL) 40 MG tablet       . ALPRAZolam (XANAX) 0.25 MG tablet Take one tablet by mouth as needed up to twice daily for anxiety  20 tablet  0    Past Medical History  Diagnosis Date  . Hypertension   . Gout   . 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 Hemorrhoid   . Dizziness   . Racing heart beat   . Chest pain     December, 2013  . Abnormal nuclear stress test     December, 2013  . CAD (coronary artery disease)     Mild nonobstructive plaque in cath 2013    Past Surgical History  Procedure Date  . Knee cartilage surgery     Left knee    ROS:  As stated in the HPI and negative for all other systems  PHYSICAL EXAM BP 125/80  Pulse 65  Ht 6\' 3"  (1.905 m)  Wt 255 lb (115.667 kg)  BMI 31.87 kg/m2 GENERAL:  Well appearing HEENT:  Pupils equal round and reactive, fundi not visualized, oral mucosa unremarkable NECK:  No jugular venous distention, waveform within normal limits, carotid upstroke  brisk and symmetric, no bruits, no thyromegaly LYMPHATICS:  No cervical, inguinal adenopathy LUNGS:  Clear to auscultation bilaterally BACK:  No CVA tenderness CHEST:  Unremarkable HEART:  PMI not displaced or sustained,S1 and S2 within normal limits, no S3, no S4, no clicks, no rubs, no murmurs ABD:  Flat, positive bowel sounds normal in frequency in pitch, no bruits, no rebound, no guarding, no midline pulsatile mass, no hepatomegaly, no splenomegaly EXT:  2 plus pulses throughout, no edema, no cyanosis no clubbing, right radial access site without erythema or bruising.   SKIN:  No rashes no nodules NEURO:  Cranial nerves II through XII grossly intact, motor grossly intact throughout PSYCH:  Cognitively intact, oriented to person place and time  EKG:   Sinus rhythm, rate 65, LAD, intervals within normal limits, no acute ST-T wave changes.  10/01/2012   ASSESSMENT AND PLAN  CAD -  We did discuss the risk reduction at length. I will likely perform exercise treadmill testing in the future to follow his nonobstructive disease. We discussed using 81 mg of aspirin a full adult aspirin.  Otherwise have made changes as listed below.  ESSENTIAL HYPERTENSION, BENIGN -  His blood pressure is controlled. However, he's a little lightheaded with  his current regimen so I will reduce his Norvasc to 5 mg daily.  PALPITATIONS -   He feels better with the beta blocker was added and I will continue this.  CARDIOMYOPATHY He did have a mildly reduced ejection fraction which I will followup with echo in the future. He's euvolemic.  DYSLIPIDEMIA -  I would suggest that he continue on a moderate dose of statin given his known coronary disease. We discussed current guideline therapy.

## 2012-10-02 ENCOUNTER — Telehealth: Payer: Self-pay | Admitting: Cardiology

## 2012-10-02 ENCOUNTER — Other Ambulatory Visit: Payer: Self-pay | Admitting: *Deleted

## 2012-10-02 MED ORDER — PRAVASTATIN SODIUM 40 MG PO TABS: 40.0000 mg | ORAL_TABLET | Freq: Every day | ORAL | Status: DC

## 2012-10-02 MED ORDER — AMLODIPINE BESYLATE 5 MG PO TABS: 5.0000 mg | ORAL_TABLET | Freq: Every day | ORAL | Status: DC

## 2012-10-02 MED ORDER — CARVEDILOL 3.125 MG PO TABS: 3.1250 mg | ORAL_TABLET | Freq: Two times a day (BID) | ORAL | Status: DC

## 2012-10-02 NOTE — Telephone Encounter (Signed)
New problem:   Seen in the [facility] office on yesterday.     Correction on his records,.   patient does not take zantac.

## 2012-10-02 NOTE — Telephone Encounter (Signed)
Noted  

## 2013-02-05 DIAGNOSIS — H2589 Other age-related cataract: Secondary | ICD-10-CM | POA: Diagnosis not present

## 2013-02-26 ENCOUNTER — Ambulatory Visit (INDEPENDENT_AMBULATORY_CARE_PROVIDER_SITE_OTHER): Payer: Medicare Other | Admitting: Family Medicine

## 2013-02-26 ENCOUNTER — Encounter: Payer: Self-pay | Admitting: Family Medicine

## 2013-02-26 VITALS — BP 142/83 | HR 67 | Temp 98.4°F | Ht 74.0 in | Wt 250.0 lb

## 2013-02-26 DIAGNOSIS — H6091 Unspecified otitis externa, right ear: Secondary | ICD-10-CM

## 2013-02-26 DIAGNOSIS — H60399 Other infective otitis externa, unspecified ear: Secondary | ICD-10-CM | POA: Diagnosis not present

## 2013-02-26 DIAGNOSIS — H6690 Otitis media, unspecified, unspecified ear: Secondary | ICD-10-CM

## 2013-02-26 DIAGNOSIS — H6092 Unspecified otitis externa, left ear: Secondary | ICD-10-CM

## 2013-02-26 MED ORDER — CIPROFLOXACIN-DEXAMETHASONE 0.3-0.1 % OT SUSP: 4.0000 [drp] | Freq: Two times a day (BID) | OTIC | Status: DC

## 2013-02-26 MED ORDER — AMOXICILLIN-POT CLAVULANATE 875-125 MG PO TABS: 1.0000 | ORAL_TABLET | Freq: Two times a day (BID) | ORAL | Status: DC

## 2013-02-26 NOTE — Progress Notes (Signed)
  Subjective:    Patient ID: Travis Padilla, male    DOB: 13-Jul-1946, 67 y.o.   MRN: [id number]  HPI EAR PAIN Location:  L ear  Description: L ear pain and discomfort  Onset:  3-4 days  [place] hx/o recurrent OE and fungal OE. No recent flare in the past year. Has used old rx of ciprodex in affected ear. Has had some improvement with this.   Symptoms  Sensation of fullness: yes Ear discharge: no URI symptoms: no  Fever: no Tinnitus:no   Dizziness:no   Hearing loss:no   Toothache: no Rashes or lesions: no Facial muscle weakness: no  Red Flags Recent trauma: no PMH prior ear surgery:  no Diabetes or Immunosuppresion: no       Review of Systems  All other systems reviewed and are negative.       Objective:   Physical Exam  Constitutional: He appears well-developed and well-nourished.  HENT:  Head: Normocephalic and atraumatic.  Right Ear: External ear normal.  Left ear canal erythema and tenderness of otoscopic evaluation Mild left TM bulging  Neck: Normal range of motion. Neck supple.  Cardiovascular: Normal rate and regular rhythm.   Pulmonary/Chest: Effort normal and breath sounds normal.  Abdominal: Soft.  Musculoskeletal: Normal range of motion.  Lymphadenopathy:    He has no cervical adenopathy.  Neurological: He is alert.  Skin: Skin is warm.          Assessment & Plan:  OE (otitis externa), right  OM (otitis media), left - Plan: amoxicillin-clavulanate (AUGMENTIN) 875-125 MG per tablet  OE (otitis externa), left - Plan: ciprofloxacin-dexamethasone (CIPRODEX) otic suspension  Will place on Augmentin and Ciprodex for treatment. Discussed ENT red flags. Consider ENT referral if symptoms persist despite treatment. Followup as needed.

## 2013-02-27 ENCOUNTER — Encounter: Payer: Self-pay | Admitting: Family Medicine

## 2013-02-27 ENCOUNTER — Telehealth: Payer: Self-pay | Admitting: Family Medicine

## 2013-02-27 DIAGNOSIS — H6690 Otitis media, unspecified, unspecified ear: Secondary | ICD-10-CM

## 2013-02-27 MED ORDER — NEOMYCIN-POLYMYXIN-HC 3.5-10000-1 OT SOLN: 2.0000 [drp] | Freq: Three times a day (TID) | OTIC | Status: DC

## 2013-02-27 NOTE — Telephone Encounter (Signed)
Pt here at office to have med changed. Cipradex needs prior authorization. Montey Hora, PA will address this.

## 2013-02-27 NOTE — Telephone Encounter (Signed)
Medication changed to Cortisporin Otic. Patient aware.

## 2013-03-02 ENCOUNTER — Telehealth: Payer: Self-pay | Admitting: Physician Assistant

## 2013-03-02 NOTE — Telephone Encounter (Signed)
Just gave on 6/20 should have enough

## 2013-03-02 NOTE — Telephone Encounter (Signed)
Wife notified. He will continue with original rx and if no improvement he will call us back

## 2013-04-01 ENCOUNTER — Encounter: Payer: Self-pay | Admitting: Cardiology

## 2013-04-01 ENCOUNTER — Ambulatory Visit (INDEPENDENT_AMBULATORY_CARE_PROVIDER_SITE_OTHER): Payer: Medicare Other | Admitting: Cardiology

## 2013-04-01 VITALS — BP 140/83 | HR 75 | Ht 75.0 in | Wt 256.0 lb

## 2013-04-01 DIAGNOSIS — R079 Chest pain, unspecified: Secondary | ICD-10-CM | POA: Diagnosis not present

## 2013-04-01 DIAGNOSIS — I2589 Other forms of chronic ischemic heart disease: Secondary | ICD-10-CM | POA: Diagnosis not present

## 2013-04-01 DIAGNOSIS — R002 Palpitations: Secondary | ICD-10-CM | POA: Diagnosis not present

## 2013-04-01 DIAGNOSIS — I255 Ischemic cardiomyopathy: Secondary | ICD-10-CM

## 2013-04-01 MED ORDER — CARVEDILOL 6.25 MG PO TABS: 6.2500 mg | ORAL_TABLET | Freq: Two times a day (BID) | ORAL | Status: DC

## 2013-04-01 NOTE — Patient Instructions (Addendum)
Please increase your Carvedilol to 6.25 mg one twice a day, Continue all other medications as listed.  Your physician has requested that you have an echocardiogram in September.  Echocardiography is a painless test that uses sound waves to create images of your heart. It provides your doctor with information about the size and shape of your heart and how well your heart's chambers and valves are working. This procedure takes approximately one hour. There are no restrictions for this procedure.  Follow up in 6 months with Dr [name].  You will receive a letter in the mail 2 months before you are due.  Please call us when you receive this letter to schedule your follow up appointment.

## 2013-04-01 NOTE — Progress Notes (Signed)
HPI The patient presents for followup of chest pain. He was evaluated in December with a stress perfusion study which suggested LAD ischemia. However, followup cardiac catheterization demonstrated only mild nonobstructive plaque.  He has had a mildly reduced ejection fraction both on stress testing and cath. At the last visit he had some lightheadedness so I backed off on his Norvasc.  Since then he says he is doing better. Is having fewer palpitations and lightheadedness. The patient denies any new symptoms such as chest discomfort, neck or arm discomfort. There has been no new shortness of breath, PND or orthopnea. There has been no reported presyncope or syncope.  No Known Allergies  Current Outpatient Prescriptions  Medication Sig Dispense Refill  . amLODipine (NORVASC) 5 MG tablet Take 1 tablet (5 mg total) by mouth daily.  90 tablet  3  . carvedilol (COREG) 3.125 MG tablet Take 1 tablet (3.125 mg total) by mouth 2 (two) times daily with a meal.  180 tablet  3  . pravastatin (PRAVACHOL) 40 MG tablet Take 1 tablet (40 mg total) by mouth daily.  90 tablet  3   No current facility-administered medications for this visit.    Past Medical History  Diagnosis Date  . Hypertension   . Gout   . 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 Hemorrhoid   . Dizziness   . Racing heart beat   . Chest pain     December, 2013  . Abnormal nuclear stress test     December, 2013  . CAD (coronary artery disease)     Mild nonobstructive plaque in cath 2013  . Hyperlipidemia     Past Surgical History  Procedure Laterality Date  . Knee cartilage surgery      Left knee    ROS:  As stated in the HPI and negative for all other systems  PHYSICAL EXAM BP 140/83  Pulse 75  Ht 6\' 3"  (1.905 m)  Wt 256 lb (116.121 kg)  BMI 32 kg/m2 GENERAL:  Well appearing HEENT:  Pupils equal round and reactive, fundi not visualized, oral mucosa unremarkable NECK:  No jugular venous distention, waveform within normal  limits, carotid upstroke brisk and symmetric, no bruits, no thyromegaly LYMPHATICS:  No cervical, inguinal adenopathy LUNGS:  Clear to auscultation bilaterally BACK:  No CVA tenderness CHEST:  Unremarkable HEART:  PMI not displaced or sustained,S1 and S2 within normal limits, no S3, no S4, no clicks, no rubs, no murmurs ABD:  Flat, positive bowel sounds normal in frequency in pitch, no bruits, no rebound, no guarding, no midline pulsatile mass, no hepatomegaly, no splenomegaly EXT:  2 plus pulses throughout, no edema, no cyanosis no clubbing, right radial access site without erythema or bruising.     EKG:   Sinus rhythm, rate 75, LAD, intervals within normal limits, no acute ST-T wave changes.  04/01/2013   ASSESSMENT AND PLAN  CAD -  He has nonobstructive disease. He will continue with risk reduction.  ESSENTIAL HYPERTENSION, BENIGN -  His blood pressure is controlled. I will make the meds changed as below.  PALPITATIONS -   He feels better with the beta blocker was added and I will continue this.  CARDIOMYOPATHY He's euvolemic. I would like to creep up on his carvedilol to 6.25 mg twice daily. Knowing that he had symptoms of lightheadedness requiring reduction of his Norvasc I will move very slowly on this. I will however check an echocardiogram.  DYSLIPIDEMIA -  I  would suggest that he continue on a moderate dose of statin given his known coronary disease.

## 2013-04-15 ENCOUNTER — Other Ambulatory Visit: Payer: Self-pay

## 2013-05-19 ENCOUNTER — Ambulatory Visit (HOSPITAL_COMMUNITY): Payer: Medicare Other | Attending: Cardiology | Admitting: Radiology

## 2013-05-19 ENCOUNTER — Other Ambulatory Visit (HOSPITAL_COMMUNITY): Payer: Medicare Other

## 2013-05-19 DIAGNOSIS — R002 Palpitations: Secondary | ICD-10-CM | POA: Diagnosis not present

## 2013-05-19 DIAGNOSIS — I2589 Other forms of chronic ischemic heart disease: Secondary | ICD-10-CM | POA: Insufficient documentation

## 2013-05-19 DIAGNOSIS — R072 Precordial pain: Secondary | ICD-10-CM | POA: Insufficient documentation

## 2013-05-19 DIAGNOSIS — Z87891 Personal history of nicotine dependence: Secondary | ICD-10-CM | POA: Insufficient documentation

## 2013-05-19 DIAGNOSIS — I10 Essential (primary) hypertension: Secondary | ICD-10-CM | POA: Insufficient documentation

## 2013-05-19 DIAGNOSIS — E669 Obesity, unspecified: Secondary | ICD-10-CM | POA: Insufficient documentation

## 2013-05-19 DIAGNOSIS — I255 Ischemic cardiomyopathy: Secondary | ICD-10-CM

## 2013-05-19 NOTE — Progress Notes (Signed)
Echocardiogram performed.  

## 2013-06-05 DIAGNOSIS — Z23 Encounter for immunization: Secondary | ICD-10-CM | POA: Diagnosis not present

## 2013-06-29 ENCOUNTER — Other Ambulatory Visit: Payer: Self-pay | Admitting: *Deleted

## 2013-06-29 DIAGNOSIS — I255 Ischemic cardiomyopathy: Secondary | ICD-10-CM

## 2013-06-29 MED ORDER — PRAVASTATIN SODIUM 40 MG PO TABS: 40.0000 mg | ORAL_TABLET | Freq: Every day | ORAL | Status: DC

## 2013-06-29 MED ORDER — AMLODIPINE BESYLATE 5 MG PO TABS: 5.0000 mg | ORAL_TABLET | Freq: Every day | ORAL | Status: DC

## 2013-06-29 MED ORDER — CARVEDILOL 6.25 MG PO TABS: 6.2500 mg | ORAL_TABLET | Freq: Two times a day (BID) | ORAL | Status: DC

## 2013-07-16 ENCOUNTER — Other Ambulatory Visit: Payer: Self-pay

## 2013-08-26 ENCOUNTER — Other Ambulatory Visit: Payer: Self-pay | Admitting: Cardiology

## 2013-10-26 ENCOUNTER — Telehealth: Payer: Self-pay | Admitting: Family Medicine

## 2013-10-26 NOTE — Telephone Encounter (Signed)
Patient will try to hold off til wed

## 2013-10-28 ENCOUNTER — Ambulatory Visit: Payer: Medicare Other | Admitting: Cardiology

## 2013-10-30 ENCOUNTER — Encounter: Payer: Self-pay | Admitting: Cardiology

## 2013-10-30 ENCOUNTER — Ambulatory Visit (INDEPENDENT_AMBULATORY_CARE_PROVIDER_SITE_OTHER): Payer: Medicare Other | Admitting: Cardiology

## 2013-10-30 VITALS — BP 132/94 | HR 70 | Ht 74.0 in | Wt 255.1 lb

## 2013-10-30 DIAGNOSIS — I10 Essential (primary) hypertension: Secondary | ICD-10-CM | POA: Diagnosis not present

## 2013-10-30 DIAGNOSIS — I255 Ischemic cardiomyopathy: Secondary | ICD-10-CM

## 2013-10-30 DIAGNOSIS — I2589 Other forms of chronic ischemic heart disease: Secondary | ICD-10-CM

## 2013-10-30 DIAGNOSIS — R002 Palpitations: Secondary | ICD-10-CM

## 2013-10-30 MED ORDER — CARVEDILOL 6.25 MG PO TABS: 9.3750 mg | ORAL_TABLET | Freq: Two times a day (BID) | ORAL | Status: DC

## 2013-10-30 NOTE — Patient Instructions (Signed)
Please increase your carvedilol to 9.375 mg twice a day. (1 and 1/2 tablet twice a day) Continue all other medications as listed.  Follow up in 3 months with Dr [name].

## 2013-10-30 NOTE — Progress Notes (Signed)
   HPI The patient presents for followup of chest pain. He was evaluated in December of 2013 with a stress perfusion study which suggested LAD ischemia. However, followup cardiac catheterization demonstrated only mild nonobstructive plaque.  He has had a mildly reduced ejection fraction both on stress testing and cath.   He returns for follow up of this.  He still does have the chest pain that prompted the entire evaluation.  However, it is no different than previous. The patient denies any new symptoms neck or arm discomfort. There has been no new shortness of breath, PND or orthopnea. There has been no reported presyncope or syncope.  He has been under a great deal of stress with the recent death of both in laws.    No Known Allergies  Current Outpatient Prescriptions  Medication Sig Dispense Refill  . amLODipine (NORVASC) 5 MG tablet Take 1 tablet (5 mg total)  by mouth daily.  90 tablet  0  . carvedilol (COREG) 6.25 MG tablet Take 1 tablet (6.25 mg total) by mouth 2 (two) times daily with a meal.  60 tablet  0  . pravastatin (PRAVACHOL) 40 MG tablet Take 1 tablet (40 mg total) by mouth daily.  90 tablet  0   No current facility-administered medications for this visit.    Past Medical History  Diagnosis Date  . Hypertension   . Gout   . 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 Hemorrhoid   . Dizziness   . Racing heart beat   . Chest pain     December, 2013  . Abnormal nuclear stress test     December, 2013  . CAD (coronary artery disease)     Mild nonobstructive plaque in cath 2013  . Hyperlipidemia     Past Surgical History  Procedure Laterality Date  . Knee cartilage surgery      Left knee    ROS:  As stated in the HPI and negative for all other systems  PHYSICAL EXAM BP 132/94  Pulse 70  Ht 6\' 2"  (1.88 m)  Wt 255 lb 1.9 oz (115.722 kg)  BMI 32.74 kg/m2 GENERAL:  Well appearing NECK:  No jugular venous distention, waveform within normal limits, carotid upstroke brisk and  symmetric, no bruits, no thyromegaly LUNGS:  Clear to auscultation bilaterally CHEST:  Unremarkable HEART:  PMI not displaced or sustained,S1 and S2 within normal limits, no S3, no S4, no clicks, no rubs, no murmurs ABD:  Flat, positive bowel sounds normal in frequency in pitch, no bruits, no rebound, no guarding, no midline pulsatile mass, no hepatomegaly, no splenomegaly EXT:  2 plus pulses throughout, no edema, no cyanosis no clubbing, right radial access site without erythema or bruising.     EKG:   Sinus rhythm, rate 70, left axis deviatoin, intervals within normal limits, no acute ST-T wave changes.  10/30/2013   ASSESSMENT AND PLAN  CAD -  He has nonobstructive disease. He will continue with risk reduction.  ESSENTIAL HYPERTENSION, BENIGN -  This is being managed in the context of treating his CHF  PALPITATIONS -   He has had no new problems related to this.  No further evaluation is necessary for this.    CARDIOMYOPATHY He's euvolemic. I would like to creep up on his carvedilol to 9.375 mg twice daily.   DYSLIPIDEMIA -  I would suggest that he continue on a moderate dose of statin given his known coronary disease.

## 2013-11-03 ENCOUNTER — Encounter: Payer: Self-pay | Admitting: Family Medicine

## 2013-11-03 ENCOUNTER — Ambulatory Visit (INDEPENDENT_AMBULATORY_CARE_PROVIDER_SITE_OTHER): Payer: Medicare Other | Admitting: Family Medicine

## 2013-11-03 VITALS — BP 146/86 | HR 70 | Temp 98.5°F | Ht 74.0 in | Wt 259.0 lb

## 2013-11-03 DIAGNOSIS — I2589 Other forms of chronic ischemic heart disease: Secondary | ICD-10-CM

## 2013-11-03 DIAGNOSIS — H60399 Other infective otitis externa, unspecified ear: Secondary | ICD-10-CM

## 2013-11-03 MED ORDER — NEOMYCIN-POLYMYXIN-HC 3.5-10000-1 OT SUSP: 3.0000 [drp] | Freq: Four times a day (QID) | OTIC | Status: DC

## 2013-11-03 MED ORDER — CIPROFLOXACIN HCL 500 MG PO TABS: 500.0000 mg | ORAL_TABLET | Freq: Two times a day (BID) | ORAL | Status: DC

## 2013-11-03 MED ORDER — ACETIC ACID 2 % OT SOLN: 4.0000 [drp] | Freq: Three times a day (TID) | OTIC | Status: DC

## 2013-11-03 NOTE — Progress Notes (Signed)
   Subjective:    Patient ID: Travis Padilla, male    DOB: [birth year], 68 y.o.   MRN: [id number]  HPI This 68 y.o. male presents for evaluation of bilateral ear pain and discomfort.   Review of Systems No chest pain, SOB, HA, dizziness, vision change, N/V, diarrhea, constipation, dysuria, urinary urgency or frequency, myalgias, arthralgias or rash.     Objective:   Physical Exam Vital signs noted  Well developed well nourished male.  HEENT - Head atraumatic Normocephalic                Eyes - PERRLA, Conjuctiva - clear Sclera- Clear EOMI                Ears - EAC's with whitish DC and TM's normal                Nose - Nares patent                 Throat - oropharanx wnl Respiratory - Lungs CTA bilateral Cardiac - RRR S1 and S2 without murmur        Assessment & Plan:  Otitis, externa, infective - Plan: acetic acid (VOSOL) 2 % otic solution, neomycin-polymyxin-hydrocortisone (CORTISPORIN) 3.5-10000-1 otic suspension, ciprofloxacin (CIPRO) 500 MG tablet  Lysbeth Penner FNP

## 2014-01-08 DIAGNOSIS — H18509 Unspecified hereditary corneal dystrophies, unspecified eye: Secondary | ICD-10-CM | POA: Diagnosis not present

## 2014-01-08 DIAGNOSIS — H2510 Age-related nuclear cataract, unspecified eye: Secondary | ICD-10-CM | POA: Diagnosis not present

## 2014-01-20 ENCOUNTER — Encounter: Payer: Self-pay | Admitting: Cardiology

## 2014-01-20 ENCOUNTER — Ambulatory Visit (INDEPENDENT_AMBULATORY_CARE_PROVIDER_SITE_OTHER): Payer: Medicare Other | Admitting: Cardiology

## 2014-01-20 VITALS — BP 132/82 | HR 64 | Ht 74.0 in | Wt 251.0 lb

## 2014-01-20 DIAGNOSIS — I10 Essential (primary) hypertension: Secondary | ICD-10-CM | POA: Diagnosis not present

## 2014-01-20 DIAGNOSIS — I2589 Other forms of chronic ischemic heart disease: Secondary | ICD-10-CM

## 2014-01-20 NOTE — Patient Instructions (Signed)
The current medical regimen is effective;  continue present plan and medications.  Follow up in 1 year with Dr Hochrein.  You will receive a letter in the mail 2 months before you are due.  Please call us when you receive this letter to schedule your follow up appointment.  

## 2014-01-20 NOTE — Progress Notes (Signed)
HPI The patient presents for followup of chest pain. He was evaluated in December of 2013 with a stress perfusion study which suggested LAD ischemia. However, followup cardiac catheterization demonstrated only mild nonobstructive plaque.  He has had a mildly reduced ejection fraction both on stress testing and cath.   He returns for follow up of this.  He has some numbness, discomfort in his left hand/arm with walking that gets better if he lifts his arm up.  He has no new chest pain.   There has been no new shortness of breath, PND or orthopnea. There has been no reported presyncope or syncope. He did get light headed when he started the increased dose of Coreg at his last visit.    No Known Allergies  Current Outpatient Prescriptions  Medication Sig Dispense Refill  . acetic acid (VOSOL) 2 % otic solution Place 4 drops into both ears 3 (three) times daily.  15 mL  1  . amLODipine (NORVASC) 5 MG tablet Take 1 tablet (5 mg total)  by mouth daily.  90 tablet  0  . carvedilol (COREG) 6.25 MG tablet Take 1.5 tablets (9.375 mg total) by mouth 2 (two) times daily with a meal.  270 tablet  3  . Multiple Vitamin (MULTIVITAMIN) capsule Take 1 capsule by mouth daily.      . pravastatin (PRAVACHOL) 40 MG tablet Take 1 tablet (40 mg total) by mouth daily.  90 tablet  0   No current facility-administered medications for this visit.    Past Medical History  Diagnosis Date  . Hypertension   . Gout   . 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 Hemorrhoid   . Dizziness   . Racing heart beat   . Chest pain     December, 2013  . Abnormal nuclear stress test     December, 2013  . CAD (coronary artery disease)     Mild nonobstructive plaque in cath 2013  . Hyperlipidemia     Past Surgical History  Procedure Laterality Date  . Knee cartilage surgery      Left knee    ROS:  As stated in the HPI and negative for all other systems  PHYSICAL EXAM BP 132/82  Pulse 64  Ht 6\' 2"  (1.88 m)  Wt 251 lb  (113.853 kg)  BMI 32.21 kg/m2 GENERAL:  Well appearing NECK:  No jugular venous distention, waveform within normal limits, carotid upstroke brisk and symmetric, no bruits, no thyromegaly LUNGS:  Clear to auscultation bilaterally CHEST:  Unremarkable HEART:  PMI not displaced or sustained,S1 and S2 within normal limits, no S3, no S4, no clicks, no rubs, no murmurs ABD:  Flat, positive bowel sounds normal in frequency in pitch, no bruits, no rebound, no guarding, no midline pulsatile mass, no hepatomegaly, no splenomegaly EXT:  2 plus pulses throughout, no edema, no cyanosis no clubbing, right radial access site without erythema or bruising.     ASSESSMENT AND PLAN  CAD -  He has nonobstructive disease. He will continue with risk reduction.  ESSENTIAL HYPERTENSION, BENIGN -  This is being managed in the context of treating his CHF  PALPITATIONS -   He has had no new problems related to this.  No further evaluation is necessary for this.    CARDIOMYOPATHY Given the fact that he had some symptoms with increased Coreg I will not make further changes to this medication.    DYSLIPIDEMIA -  I would suggest that he  continue on a moderate dose of statin given his known coronary disease.

## 2014-01-21 ENCOUNTER — Other Ambulatory Visit: Payer: Self-pay | Admitting: Cardiology

## 2014-04-20 DIAGNOSIS — H25019 Cortical age-related cataract, unspecified eye: Secondary | ICD-10-CM | POA: Diagnosis not present

## 2014-04-20 DIAGNOSIS — H2510 Age-related nuclear cataract, unspecified eye: Secondary | ICD-10-CM | POA: Diagnosis not present

## 2014-04-20 DIAGNOSIS — H18419 Arcus senilis, unspecified eye: Secondary | ICD-10-CM | POA: Diagnosis not present

## 2014-04-20 DIAGNOSIS — H25049 Posterior subcapsular polar age-related cataract, unspecified eye: Secondary | ICD-10-CM | POA: Diagnosis not present

## 2014-04-20 DIAGNOSIS — H02839 Dermatochalasis of unspecified eye, unspecified eyelid: Secondary | ICD-10-CM | POA: Diagnosis not present

## 2014-06-07 DIAGNOSIS — H2510 Age-related nuclear cataract, unspecified eye: Secondary | ICD-10-CM | POA: Diagnosis not present

## 2014-06-07 DIAGNOSIS — H269 Unspecified cataract: Secondary | ICD-10-CM | POA: Diagnosis not present

## 2014-06-08 DIAGNOSIS — H2510 Age-related nuclear cataract, unspecified eye: Secondary | ICD-10-CM | POA: Diagnosis not present

## 2014-06-18 ENCOUNTER — Ambulatory Visit (INDEPENDENT_AMBULATORY_CARE_PROVIDER_SITE_OTHER): Payer: Medicare Other | Admitting: Family Medicine

## 2014-06-18 ENCOUNTER — Encounter: Payer: Self-pay | Admitting: Family Medicine

## 2014-06-18 VITALS — BP 142/78 | HR 74 | Temp 98.6°F | Ht 74.0 in | Wt 256.0 lb

## 2014-06-18 DIAGNOSIS — R062 Wheezing: Secondary | ICD-10-CM

## 2014-06-18 DIAGNOSIS — J209 Acute bronchitis, unspecified: Secondary | ICD-10-CM | POA: Diagnosis not present

## 2014-06-18 DIAGNOSIS — I255 Ischemic cardiomyopathy: Secondary | ICD-10-CM

## 2014-06-18 DIAGNOSIS — R05 Cough: Secondary | ICD-10-CM

## 2014-06-18 DIAGNOSIS — R059 Cough, unspecified: Secondary | ICD-10-CM

## 2014-06-18 LAB — POCT CBC
Granulocyte percent: 69.2 %G (ref 37–80)
HEMATOCRIT: 41.3 % — AB (ref 43.5–53.7)
Hemoglobin: 13.8 g/dL — AB (ref 14.1–18.1)
LYMPH, POC: 1.7 (ref 0.6–3.4)
MCH: 31 pg (ref 27–31.2)
MCHC: 33.3 g/dL (ref 31.8–35.4)
MCV: 93.3 fL (ref 80–97)
MPV: 8.2 fL (ref 0–99.8)
PLATELET COUNT, POC: 221 10*3/uL (ref 142–424)
POC Granulocyte: 5.1 (ref 2–6.9)
POC LYMPH PERCENT: 23.4 %L (ref 10–50)
RBC: 4.4 M/uL — AB (ref 4.69–6.13)
RDW, POC: 12.6 %
WBC: 7.4 10*3/uL (ref 4.6–10.2)

## 2014-06-18 MED ORDER — ALBUTEROL SULFATE HFA 108 (90 BASE) MCG/ACT IN AERS: 2.0000 | INHALATION_SPRAY | Freq: Four times a day (QID) | RESPIRATORY_TRACT | Status: DC | PRN

## 2014-06-18 MED ORDER — HYDROCOD POLST-CHLORPHEN POLST 10-8 MG/5ML PO LQCR: 5.0000 mL | Freq: Every evening | ORAL | Status: DC | PRN

## 2014-06-18 MED ORDER — AZITHROMYCIN 250 MG PO TABS: ORAL_TABLET | ORAL | Status: DC

## 2014-06-18 NOTE — Patient Instructions (Signed)
Take antibiotic as directed Drink plenty of fluids Take Tylenol as needed for aches pains and fever Take Mucinex, maximum strength, blue and white in color, over-the-counter, one twice daily with a large glass of water Use the inhaler as needed for wheezing Reserve the cough syrup if needed for severe cough at nighttime, take 1/2-1 teaspoon only if needed

## 2014-06-18 NOTE — Progress Notes (Signed)
Subjective:    Patient ID: Travis Padilla, male    DOB: [birth year], 68 y.o.   MRN: [id number]  HPI Patient here today for cough and congestion. The patient complains of cough congestion and wheezing. This has been going on for one week and the wheezing is worse when he's laying down at night. The cough is productive of green and white sputum. He has not checked his temperature but says he has felt warm at times. He has had no problems with his heart and no increasing edema.      Patient Active Problem List   Diagnosis Date Noted  . Chest pain   . Abnormal nuclear stress test   . OBESITY, UNSPECIFIED 03/28/2009  . ESSENTIAL HYPERTENSION, BENIGN 03/28/2009  . PALPITATIONS 03/28/2009  . SNORING 03/28/2009   Outpatient Encounter Prescriptions as of 06/18/2014  Medication Sig  . acetic acid (VOSOL) 2 % otic solution Place 4 drops into both ears 3 (three) times daily.  Marland Kitchen amLODipine (NORVASC) 5 MG tablet Take 1 tablet by mouth  daily  . carvedilol (COREG) 6.25 MG tablet Take 1.5 tablets (9.375 mg total) by mouth 2 (two) times daily with a meal.  . cholecalciferol (VITAMIN D) 1000 UNITS tablet Take 1,000 Units by mouth daily.  . Iron-Vit C-Vit B12-Folic Acid (IRON 751 PLUS PO) Take 1 tablet by mouth daily.  . Multiple Vitamin (MULTIVITAMIN) capsule Take 1 capsule by mouth daily.  . pravastatin (PRAVACHOL) 40 MG tablet Take 1 tablet by mouth  daily    Review of Systems  Constitutional: Negative.   HENT: Positive for congestion.   Eyes: Negative.   Respiratory: Positive for cough (productive) and wheezing.   Cardiovascular: Negative.   Gastrointestinal: Negative.   Endocrine: Negative.   Genitourinary: Negative.   Musculoskeletal: Negative.   Skin: Negative.   Allergic/Immunologic: Negative.   Neurological: Negative.   Hematological: Negative.   Psychiatric/Behavioral: Negative.        Objective:   Physical Exam  Nursing note and vitals reviewed. Constitutional: He is  oriented to person, place, and time. He appears well-developed and well-nourished. No distress.  HENT:  Head: Normocephalic and atraumatic.  Right Ear: External ear normal.  Left Ear: External ear normal.  Mouth/Throat: No oropharyngeal exudate.  Nasal congestion bilaterally, throat is slightly red posteriorly  Eyes: Conjunctivae and EOM are normal. Pupils are equal, round, and reactive to light. Right eye exhibits no discharge. Left eye exhibits no discharge. No scleral icterus.  Neck: Normal range of motion. Neck supple. No thyromegaly present.  Cardiovascular: Normal rate, regular rhythm, normal heart sounds and intact distal pulses.  Exam reveals no gallop and no friction rub.   No murmur heard. At 60 per minute  Pulmonary/Chest: Effort normal. No respiratory distress. He has wheezes. He has no rales. He exhibits no tenderness.  There is bronchial congestion with coughing and a rare wheeze  Abdominal: He exhibits no mass.  Musculoskeletal: Normal range of motion. He exhibits no edema.  Lymphadenopathy:    He has no cervical adenopathy.  Neurological: He is alert and oriented to person, place, and time.  Skin: Skin is warm and dry. No rash noted.  Psychiatric: He has a normal mood and affect. His behavior is normal. Judgment and thought content normal.   BP 142/78  Pulse 74  Temp(Src) 98.6 F (37 C) (Oral)  Ht 6\' 2"  (1.88 m)  Wt 256 lb (116.121 kg)  BMI 32.85 kg/m2  Results for orders placed in visit on 06/18/14  POCT CBC      Result Value Ref Range   WBC 7.4  4.6 - 10.2 K/uL   Lymph, poc 1.7  0.6 - 3.4   POC LYMPH PERCENT 23.4  10 - 50 %L   POC Granulocyte 5.1  2 - 6.9   Granulocyte percent 69.2  37 - 80 %G   RBC 4.4 (*) 4.69 - 6.13 M/uL   Hemoglobin 13.8 (*) 14.1 - 18.1 g/dL   HCT, POC 41.3 (*) 43.5 - 53.7 %   MCV 93.3  80 - 97 fL   MCH, POC 31.0  27 - 31.2 pg   MCHC 33.3  31.8 - 35.4 g/dL   RDW, POC 12.6     Platelet Count, POC 221.0  142 - 424 K/uL   MPV 8.2  0 -  99.8 fL                                      Assessment & Plan:  1. Acute bronchitis, unspecified organism - azithromycin (ZITHROMAX) 250 MG tablet; 2 pills the first dose then 1 daily for infection until complete  Dispense: 6 tablet; Refill: 0 - POCT CBC  2. Cough - chlorpheniramine-HYDROcodone (TUSSIONEX PENNKINETIC ER) 10-8 MG/5ML LQCR; Take 5 mLs by mouth at bedtime as needed for cough.  Dispense: 90 mL; Refill: 0  3. Wheezing -Use albuterol inhaler as needed   Meds ordered this encounter  Medications  . cholecalciferol (VITAMIN D) 1000 UNITS tablet    Sig: Take 1,000 Units by mouth daily.  . Iron-Vit C-Vit B12-Folic Acid (IRON 161 PLUS PO)    Sig: Take 1 tablet by mouth daily.  Marland Kitchen azithromycin (ZITHROMAX) 250 MG tablet    Sig: 2 pills the first dose then 1 daily for infection until complete    Dispense:  6 tablet    Refill:  0  . chlorpheniramine-HYDROcodone (TUSSIONEX PENNKINETIC ER) 10-8 MG/5ML LQCR    Sig: Take 5 mLs by mouth at bedtime as needed for cough.    Dispense:  90 mL    Refill:  0  . albuterol (PROVENTIL HFA;VENTOLIN HFA) 108 (90 BASE) MCG/ACT inhaler    Sig: Inhale 2 puffs into the lungs every 6 (six) hours as needed for wheezing or shortness of breath.    Dispense:  1 Inhaler    Refill:  2   Patient Instructions  Take antibiotic as directed Drink plenty of fluids Take Tylenol as needed for aches pains and fever Take Mucinex, maximum strength, blue and white in color, over-the-counter, one twice daily with a large glass of water Use the inhaler as needed for wheezing Reserve the cough syrup if needed for severe cough at nighttime, take 1/2-1 teaspoon only if needed   Arrie Senate MD

## 2014-06-19 DIAGNOSIS — S8392XS Sprain of unspecified site of left knee, sequela: Secondary | ICD-10-CM | POA: Diagnosis not present

## 2014-06-21 ENCOUNTER — Ambulatory Visit (INDEPENDENT_AMBULATORY_CARE_PROVIDER_SITE_OTHER): Payer: Medicare Other | Admitting: Family

## 2014-06-21 ENCOUNTER — Encounter: Payer: Self-pay | Admitting: Family

## 2014-06-21 ENCOUNTER — Ambulatory Visit (INDEPENDENT_AMBULATORY_CARE_PROVIDER_SITE_OTHER): Payer: Medicare Other

## 2014-06-21 VITALS — BP 127/81 | HR 70 | Temp 97.8°F | Wt 257.4 lb

## 2014-06-21 DIAGNOSIS — I255 Ischemic cardiomyopathy: Secondary | ICD-10-CM | POA: Diagnosis not present

## 2014-06-21 DIAGNOSIS — M25562 Pain in left knee: Secondary | ICD-10-CM | POA: Diagnosis not present

## 2014-06-21 DIAGNOSIS — S8392XA Sprain of unspecified site of left knee, initial encounter: Secondary | ICD-10-CM | POA: Diagnosis not present

## 2014-06-21 DIAGNOSIS — M25569 Pain in unspecified knee: Secondary | ICD-10-CM | POA: Diagnosis not present

## 2014-06-21 MED ORDER — MELOXICAM 7.5 MG PO TABS: 7.5000 mg | ORAL_TABLET | Freq: Every day | ORAL | Status: DC

## 2014-06-21 MED ORDER — KETOROLAC TROMETHAMINE 30 MG/ML IJ SOLN
30.0000 mg | Freq: Once | INTRAMUSCULAR | Status: AC
  Administered 2014-06-21: 30 mg via INTRAMUSCULAR

## 2014-06-21 NOTE — Progress Notes (Signed)
Subjective:    Patient ID: Travis Padilla, male    DOB: 11/18/1945, 67 y.o.   MRN: [id number]  Knee Pain  The incident occurred 5 to 7 days ago ("Thursday"). The incident occurred in the yard. The injury mechanism was a twisting injury. The pain is present in the left knee. The quality of the pain is described as aching. The pain is at a severity of 3/10. The pain is mild. The pain has been fluctuating since onset. Associated symptoms include an inability to bear weight. Pertinent negatives include no numbness or tingling. He reports no foreign bodies present. The symptoms are aggravated by movement. He has tried heat, elevation and rest (norco) for the symptoms. The treatment provided mild relief.      Review of Systems  Constitutional: Negative.   HENT: Negative.   Respiratory: Negative.   Cardiovascular: Negative.   Gastrointestinal: Negative.   Endocrine: Negative.   Genitourinary: Negative.   Musculoskeletal: Negative.   Neurological: Negative.  Negative for tingling and numbness.  Hematological: Negative.   Psychiatric/Behavioral: Negative.   All other systems reviewed and are negative.      Objective:   Physical Exam  Vitals reviewed. Constitutional: He is oriented to person, place, and time. He appears well-developed and well-nourished. No distress.  HENT:  Head: Normocephalic.  Right Ear: External ear normal.  Left Ear: External ear normal.  Nose: Nose normal.  Mouth/Throat: Oropharynx is clear and moist.  Eyes: Pupils are equal, round, and reactive to light. Right eye exhibits no discharge. Left eye exhibits no discharge.  Neck: Normal range of motion. Neck supple. No thyromegaly present.  Cardiovascular: Normal rate, regular rhythm, normal heart sounds and intact distal pulses.   No murmur heard. Pulmonary/Chest: Effort normal and breath sounds normal. No respiratory distress. He has no wheezes.  Abdominal: Soft. Bowel sounds are normal. He exhibits no  distension. There is no tenderness.  Musculoskeletal: Normal range of motion. He exhibits edema (Trace amt of edema in left knee) and tenderness.  Decreased ROM of Left knee r/t pain  Neurological: He is alert and oriented to person, place, and time. He has normal reflexes. No cranial nerve deficit.  Skin: Skin is warm and dry. No rash noted. No erythema.  Psychiatric: He has a normal mood and affect. His behavior is normal. Judgment and thought content normal.    BP 127/81  Pulse 70  Temp(Src) 97.8 F (36.6 C) (Oral)  Wt 257 lb 6.4 oz (116.756 kg)       Assessment & Plan:  1. Pain in left knee - DG Knee 1-2 Views Left; Future - BMP8+EGFR - Uric acid  2. Knee sprain, left, initial encounter -Rest -Ice No other NSAID's -RTO prn - meloxicam (MOBIC) 7.5 MG tablet; Take 1 tablet (7.5 mg total) by mouth daily.  Dispense: 30 tablet; Refill: 0 - ketorolac (TORADOL) 30 MG/ML injection 30 mg; Inject 1 mL (30 mg total) into the muscle once.   , FNP   

## 2014-06-21 NOTE — Patient Instructions (Signed)
Knee Sprain A knee sprain is a tear in one of the strong, fibrous tissues that connect the bones (ligaments) in your knee. The severity of the sprain depends on how much of the ligament is torn. The tear can be either partial or complete. CAUSES  Often, sprains are a result of a fall or injury. The force of the impact causes the fibers of your ligament to stretch too much. This excess tension causes the fibers of your ligament to tear. SIGNS AND SYMPTOMS  You may have some loss of motion in your knee. Other symptoms include:  Bruising.  Pain in the knee area.  Tenderness of the knee to the touch.  Swelling. DIAGNOSIS  To diagnose a knee sprain, your health care provider will physically examine your knee. Your health care provider may also suggest an X-ray exam of your knee to make sure no bones are broken. TREATMENT  If your ligament is only partially torn, treatment usually involves keeping the knee in a fixed position (immobilization) or bracing your knee for activities that require movement for several weeks. To do this, your health care provider will apply a bandage, cast, or splint to keep your knee from moving and to support your knee during movement until it heals. For a partially torn ligament, the healing process usually takes 4-6 weeks. If your ligament is completely torn, depending on which ligament it is, you may need surgery to reconnect the ligament to the bone or reconstruct it. After surgery, a cast or splint may be applied and will need to stay on your knee for 4-6 weeks while your ligament heals. HOME CARE INSTRUCTIONS  Keep your injured knee elevated to decrease swelling.  To ease pain and swelling, apply ice to the injured area:  Put ice in a plastic bag.  Place a towel between your skin and the bag.  Leave the ice on for 20 minutes, 2-3 times a day.  Only take medicine for pain as directed by your health care provider.  Do not leave your knee unprotected until  pain and stiffness go away (usually 4-6 weeks).  If you have a cast or splint, do not allow it to get wet. If you have been instructed not to remove it, cover it with a plastic bag when you shower or bathe. Do not swim.  Your health care provider may suggest exercises for you to do during your recovery to prevent or limit permanent weakness and stiffness. SEEK IMMEDIATE MEDICAL CARE IF:  Your cast or splint becomes damaged.  Your pain becomes worse.  You have significant pain, swelling, or numbness below the cast or splint. MAKE SURE YOU:  Understand these instructions.  Will watch your condition.  Will get help right away if you are not doing well or get worse. Document Released: 08/27/2005 Document Revised: 06/17/2013 Document Reviewed: 04/08/2013 [employer] Patient Information 2015 [employer]. This information is not intended to replace advice given to you by your health care provider. Make sure you discuss any questions you have with your health care provider.  

## 2014-06-22 ENCOUNTER — Other Ambulatory Visit: Payer: Self-pay | Admitting: Family

## 2014-06-22 LAB — BMP8+EGFR
BUN/Creatinine Ratio: 10 (ref 10–22)
BUN: 12 mg/dL (ref 8–27)
CHLORIDE: 99 mmol/L (ref 97–108)
CO2: 24 mmol/L (ref 18–29)
CREATININE: 1.17 mg/dL (ref 0.76–1.27)
Calcium: 8.9 mg/dL (ref 8.6–10.2)
GFR calc Af Amer: 75 mL/min/{1.73_m2} (ref >59)
GFR calc non Af Amer: 65 mL/min/{1.73_m2} (ref >59)
Glucose: 94 mg/dL (ref 65–99)
POTASSIUM: 5.3 mmol/L — AB (ref 3.5–5.2)
SODIUM: 139 mmol/L (ref 134–144)

## 2014-06-22 LAB — URIC ACID: Uric Acid: 7.1 mg/dL (ref 3.7–8.6)

## 2014-06-22 MED ORDER — ALLOPURINOL 100 MG PO TABS: 100.0000 mg | ORAL_TABLET | Freq: Every day | ORAL | Status: DC

## 2014-06-23 ENCOUNTER — Telehealth: Payer: Self-pay | Admitting: Family

## 2014-06-23 MED ORDER — METHYLPREDNISOLONE (PAK) 4 MG PO TABS: ORAL_TABLET | ORAL | Status: DC

## 2014-06-23 NOTE — Telephone Encounter (Signed)
RX sent to pharmacy per pt's request

## 2014-06-26 ENCOUNTER — Other Ambulatory Visit: Payer: Self-pay | Admitting: Cardiology

## 2014-06-26 NOTE — Telephone Encounter (Signed)
Rx(s) were sent to pharmacy electronically.

## 2014-06-29 ENCOUNTER — Encounter: Payer: Self-pay | Admitting: Family Medicine

## 2014-06-29 ENCOUNTER — Ambulatory Visit (INDEPENDENT_AMBULATORY_CARE_PROVIDER_SITE_OTHER): Payer: Medicare Other | Admitting: Family Medicine

## 2014-06-29 VITALS — BP 147/88 | HR 104 | Temp 98.5°F | Ht 74.0 in

## 2014-06-29 DIAGNOSIS — R05 Cough: Secondary | ICD-10-CM | POA: Diagnosis not present

## 2014-06-29 DIAGNOSIS — R059 Cough, unspecified: Secondary | ICD-10-CM

## 2014-06-29 DIAGNOSIS — R509 Fever, unspecified: Secondary | ICD-10-CM

## 2014-06-29 DIAGNOSIS — J0101 Acute recurrent maxillary sinusitis: Secondary | ICD-10-CM

## 2014-06-29 DIAGNOSIS — I255 Ischemic cardiomyopathy: Secondary | ICD-10-CM | POA: Diagnosis not present

## 2014-06-29 DIAGNOSIS — R52 Pain, unspecified: Secondary | ICD-10-CM | POA: Diagnosis not present

## 2014-06-29 LAB — POCT INFLUENZA A/B
Influenza A, POC: NEGATIVE
Influenza B, POC: NEGATIVE

## 2014-06-29 MED ORDER — LEVOFLOXACIN 500 MG PO TABS: 500.0000 mg | ORAL_TABLET | Freq: Every day | ORAL | Status: DC

## 2014-06-29 MED ORDER — HYDROCODONE-HOMATROPINE 5-1.5 MG/5ML PO SYRP: 5.0000 mL | ORAL_SOLUTION | Freq: Three times a day (TID) | ORAL | Status: DC | PRN

## 2014-06-29 NOTE — Progress Notes (Signed)
   Subjective:    Patient ID: Travis Padilla, male    DOB: Feb 23, 1946, 68 y.o.   MRN: [id number]  HPI C/o fever and chills and sweats.  He has been having URI sx's.  He has been having some nasal drainage.  He does have cough.  He was treated with zpak a few weeks ago.  He has mucopurulent nasal drainage.   Review of Systems No chest pain, SOB, HA, dizziness, vision change, N/V, diarrhea, constipation, dysuria, urinary urgency or frequency, myalgias, arthralgias or rash.     Objective:   Physical Exam Vital signs noted  Well developed well nourished male.  HEENT - Head atraumatic Normocephalic                Eyes - PERRLA, Conjuctiva - clear Sclera- Clear EOMI                Ears - EAC's Wnl TM's Wnl Gross Hearing WNL                Nose - Nares patent                 Throat - oropharanx wnl Respiratory - Lungs CTA bilateral Cardiac - RRR S1 and S2 without murmur GI - Abdomen soft Nontender and bowel sounds active x 4 Extremities - No edema. Neuro - Grossly intact.       Assessment & Plan:  Fever and chills - Plan: POCT Influenza A/B, levofloxacin (LEVAQUIN) 500 MG tablet, DISCONTINUED: levofloxacin (LEVAQUIN) 500 MG tablet  Cough - Plan: POCT Influenza A/B, HYDROcodone-homatropine (HYCODAN) 5-1.5 MG/5ML syrup, levofloxacin (LEVAQUIN) 500 MG tablet, DISCONTINUED: levofloxacin (LEVAQUIN) 500 MG tablet  Body aches - Plan: POCT Influenza A/B, HYDROcodone-homatropine (HYCODAN) 5-1.5 MG/5ML syrup, levofloxacin (LEVAQUIN) 500 MG tablet, DISCONTINUED: levofloxacin (LEVAQUIN) 500 MG tablet  Acute recurrent maxillary sinusitis - Plan: HYDROcodone-homatropine (HYCODAN) 5-1.5 MG/5ML syrup, levofloxacin (LEVAQUIN) 500 MG tablet, DISCONTINUED: levofloxacin (LEVAQUIN) 500 MG tablet  Lysbeth Penner FNP

## 2014-07-02 DIAGNOSIS — R002 Palpitations: Secondary | ICD-10-CM | POA: Diagnosis not present

## 2014-07-02 DIAGNOSIS — F419 Anxiety disorder, unspecified: Secondary | ICD-10-CM | POA: Diagnosis not present

## 2014-07-03 ENCOUNTER — Telehealth: Payer: Self-pay | Admitting: Physician Assistant

## 2014-07-03 ENCOUNTER — Other Ambulatory Visit: Payer: Self-pay | Admitting: Physician Assistant

## 2014-07-03 MED ORDER — AMLODIPINE BESYLATE 5 MG PO TABS: 5.0000 mg | ORAL_TABLET | Freq: Every day | ORAL | Status: DC

## 2014-07-03 NOTE — Telephone Encounter (Signed)
Patient traveled out of town and forgot his amlodipine.  Reordered.  Tarri Fuller PAC

## 2014-07-05 DIAGNOSIS — F419 Anxiety disorder, unspecified: Secondary | ICD-10-CM | POA: Diagnosis not present

## 2014-07-05 DIAGNOSIS — R002 Palpitations: Secondary | ICD-10-CM | POA: Diagnosis not present

## 2014-07-15 DIAGNOSIS — M25562 Pain in left knee: Secondary | ICD-10-CM | POA: Diagnosis not present

## 2014-07-19 DIAGNOSIS — H2513 Age-related nuclear cataract, bilateral: Secondary | ICD-10-CM | POA: Diagnosis not present

## 2014-07-19 DIAGNOSIS — H2512 Age-related nuclear cataract, left eye: Secondary | ICD-10-CM | POA: Diagnosis not present

## 2014-07-19 DIAGNOSIS — H269 Unspecified cataract: Secondary | ICD-10-CM | POA: Diagnosis not present

## 2014-08-03 ENCOUNTER — Ambulatory Visit (INDEPENDENT_AMBULATORY_CARE_PROVIDER_SITE_OTHER): Payer: Medicare Other | Admitting: Family Medicine

## 2014-08-03 ENCOUNTER — Encounter: Payer: Self-pay | Admitting: Family Medicine

## 2014-08-03 VITALS — BP 118/76 | HR 89 | Temp 98.7°F | Ht 73.5 in | Wt 246.6 lb

## 2014-08-03 DIAGNOSIS — S8392XA Sprain of unspecified site of left knee, initial encounter: Secondary | ICD-10-CM | POA: Diagnosis not present

## 2014-08-03 DIAGNOSIS — R079 Chest pain, unspecified: Secondary | ICD-10-CM | POA: Diagnosis not present

## 2014-08-03 DIAGNOSIS — I10 Essential (primary) hypertension: Secondary | ICD-10-CM | POA: Diagnosis not present

## 2014-08-03 DIAGNOSIS — I255 Ischemic cardiomyopathy: Secondary | ICD-10-CM | POA: Diagnosis not present

## 2014-08-03 DIAGNOSIS — N400 Enlarged prostate without lower urinary tract symptoms: Secondary | ICD-10-CM

## 2014-08-03 DIAGNOSIS — E790 Hyperuricemia without signs of inflammatory arthritis and tophaceous disease: Secondary | ICD-10-CM

## 2014-08-03 DIAGNOSIS — Z23 Encounter for immunization: Secondary | ICD-10-CM | POA: Diagnosis not present

## 2014-08-03 MED ORDER — LORAZEPAM 1 MG PO TABS: 1.0000 mg | ORAL_TABLET | Freq: Every day | ORAL | Status: DC

## 2014-08-03 MED ORDER — MELOXICAM 7.5 MG PO TABS: 7.5000 mg | ORAL_TABLET | Freq: Every day | ORAL | Status: DC

## 2014-08-03 NOTE — Progress Notes (Signed)
   Subjective:    Patient ID: Travis Padilla, male    DOB: Apr 18, 1946, 68 y.o.   MRN: [id number]  HPI 68 year old gentleman who presents for annual physical. He has recently been in [place] settling in a state which has been a stressful issue for him. There he had some issues with chest pain and breathing difficulties which were evaluated in urgent care and told may be stress related. He was given some Ativan which he takes as needed. He does have a history of coronary artery disease and is followed by local cardiologist. He takes pravastatin for hyperlipidemia. He does not have any chest pain that could be suggestive of ischemic disease. The breathing seems to be related to stress. He did have an episode of bronchitis and was given some albuterol for wheezing. He is also concerned about gout. He was previously placed on allopurinol but does not take it currently. Last gout attack was some years ago but he is interested in his uric acid level    Review of Systems  Constitutional: Negative.   HENT: Negative.   Eyes: Negative.   Respiratory: Positive for shortness of breath.   Cardiovascular: Negative.   Gastrointestinal: Negative.   Genitourinary: Positive for urgency.  Musculoskeletal: Negative.   Neurological: Negative.   Psychiatric/Behavioral: Negative.        Objective:   Physical Exam  Constitutional: He is oriented to person, place, and time. He appears well-developed and well-nourished.  HENT:  Head: Normocephalic.  Right Ear: External ear normal.  Left Ear: External ear normal.  Nose: Nose normal.  Mouth/Throat: Oropharynx is clear and moist.  Eyes: Conjunctivae and EOM are normal. Pupils are equal, round, and reactive to light.  Neck: Normal range of motion. Neck supple.  Cardiovascular: Normal rate, regular rhythm, normal heart sounds and intact distal pulses.   Pulmonary/Chest: Effort normal and breath sounds normal.  Abdominal: Soft. Bowel sounds are normal.    Genitourinary: Prostate normal.  Musculoskeletal: Normal range of motion.  Neurological: He is alert and oriented to person, place, and time.  Skin: Skin is warm and dry.  Psychiatric: He has a normal mood and affect. His behavior is normal. Judgment and thought content normal.   BP 118/76 mmHg  Pulse 89  Temp(Src) 98.7 F (37.1 C) (Oral)  Ht 6' 1.5" (1.867 m)  Wt 246 lb 9.6 oz (111.857 kg)  BMI 32.09 kg/m2       Assessment & Plan:  1. Essential hypertension, benign Blood pressure well controlled on current medication  2. Chest pain, unspecified chest pain type Chest pain does not seem to be ischemic in nature but encouraged him to keep follow-up appointment with cardiologist first of year

## 2014-08-04 ENCOUNTER — Other Ambulatory Visit: Payer: Medicare Other

## 2014-08-04 DIAGNOSIS — N400 Enlarged prostate without lower urinary tract symptoms: Secondary | ICD-10-CM | POA: Diagnosis not present

## 2014-08-04 DIAGNOSIS — M109 Gout, unspecified: Secondary | ICD-10-CM

## 2014-08-04 DIAGNOSIS — E790 Hyperuricemia without signs of inflammatory arthritis and tophaceous disease: Secondary | ICD-10-CM | POA: Diagnosis not present

## 2014-08-04 DIAGNOSIS — R079 Chest pain, unspecified: Secondary | ICD-10-CM | POA: Diagnosis not present

## 2014-08-04 DIAGNOSIS — R7989 Other specified abnormal findings of blood chemistry: Secondary | ICD-10-CM | POA: Diagnosis not present

## 2014-08-05 LAB — URIC ACID: Uric Acid: 7.6 mg/dL (ref 3.7–8.6)

## 2014-08-05 LAB — CMP14+EGFR
ALK PHOS: 52 IU/L (ref 39–117)
ALT: 12 IU/L (ref 0–44)
AST: 15 IU/L (ref 0–40)
Albumin/Globulin Ratio: 1.9 (ref 1.1–2.5)
Albumin: 4.3 g/dL (ref 3.6–4.8)
BUN/Creatinine Ratio: 12 (ref 10–22)
BUN: 11 mg/dL (ref 8–27)
CHLORIDE: 107 mmol/L (ref 97–108)
CO2: 25 mmol/L (ref 18–29)
Calcium: 9.1 mg/dL (ref 8.6–10.2)
Creatinine, Ser: 0.9 mg/dL (ref 0.76–1.27)
GFR calc Af Amer: 102 mL/min/{1.73_m2} (ref >59)
GFR calc non Af Amer: 88 mL/min/{1.73_m2} (ref >59)
Globulin, Total: 2.3 g/dL (ref 1.5–4.5)
Glucose: 91 mg/dL (ref 65–99)
POTASSIUM: 4.7 mmol/L (ref 3.5–5.2)
SODIUM: 145 mmol/L — AB (ref 134–144)
Total Bilirubin: 0.4 mg/dL (ref 0.0–1.2)
Total Protein: 6.6 g/dL (ref 6.0–8.5)

## 2014-08-05 LAB — LIPID PANEL
Chol/HDL Ratio: 3.4 ratio units (ref 0.0–5.0)
Cholesterol, Total: 138 mg/dL (ref 100–199)
HDL: 41 mg/dL (ref >39)
LDL CALC: 76 mg/dL (ref 0–99)
TRIGLYCERIDES: 105 mg/dL (ref 0–149)
VLDL CHOLESTEROL CAL: 21 mg/dL (ref 5–40)

## 2014-08-05 LAB — PSA, TOTAL AND FREE
PSA FREE: 0.39 ng/mL
PSA, Free Pct: 17.7 %
PSA: 2.2 ng/mL (ref 0.0–4.0)

## 2014-08-06 ENCOUNTER — Telehealth: Payer: Self-pay

## 2014-08-06 NOTE — Telephone Encounter (Signed)
Wife aware of lab results

## 2014-08-06 NOTE — Telephone Encounter (Signed)
-----   Message from Wardell Honour, MD sent at 08/05/2014 12:31 PM EST ----- All labs are in normal ranges

## 2014-08-09 DIAGNOSIS — H11433 Conjunctival hyperemia, bilateral: Secondary | ICD-10-CM | POA: Diagnosis not present

## 2014-08-09 DIAGNOSIS — H02413 Mechanical ptosis of bilateral eyelids: Secondary | ICD-10-CM | POA: Diagnosis not present

## 2014-08-10 ENCOUNTER — Telehealth: Payer: Self-pay | Admitting: Family Medicine

## 2014-08-10 NOTE — Telephone Encounter (Signed)
Patient went to give blood and patients HGB was 11. Patients Hgb was 13.8 in October. Appointment given for tomorrow to follow up on HGB.

## 2014-08-11 ENCOUNTER — Encounter: Payer: Self-pay | Admitting: Internal Medicine

## 2014-08-11 ENCOUNTER — Ambulatory Visit (INDEPENDENT_AMBULATORY_CARE_PROVIDER_SITE_OTHER): Payer: Medicare Other | Admitting: Family Medicine

## 2014-08-11 ENCOUNTER — Encounter: Payer: Self-pay | Admitting: Family Medicine

## 2014-08-11 VITALS — BP 133/77 | HR 60 | Temp 98.7°F | Ht 73.5 in | Wt 247.0 lb

## 2014-08-11 DIAGNOSIS — I255 Ischemic cardiomyopathy: Secondary | ICD-10-CM

## 2014-08-11 DIAGNOSIS — K5909 Other constipation: Secondary | ICD-10-CM

## 2014-08-11 DIAGNOSIS — F411 Generalized anxiety disorder: Secondary | ICD-10-CM

## 2014-08-11 DIAGNOSIS — D508 Other iron deficiency anemias: Secondary | ICD-10-CM

## 2014-08-11 DIAGNOSIS — K649 Unspecified hemorrhoids: Secondary | ICD-10-CM | POA: Diagnosis not present

## 2014-08-11 DIAGNOSIS — K648 Other hemorrhoids: Secondary | ICD-10-CM

## 2014-08-11 DIAGNOSIS — D649 Anemia, unspecified: Secondary | ICD-10-CM | POA: Diagnosis not present

## 2014-08-11 DIAGNOSIS — R5383 Other fatigue: Secondary | ICD-10-CM | POA: Diagnosis not present

## 2014-08-11 LAB — POCT CBC
Granulocyte percent: 56.8 %G (ref 37–80)
HCT, POC: 35.9 % — AB (ref 43.5–53.7)
Hemoglobin: 12 g/dL — AB (ref 14.1–18.1)
Lymph, poc: 1.6 (ref 0.6–3.4)
MCH, POC: 29.7 pg (ref 27–31.2)
MCHC: 33.3 g/dL (ref 31.8–35.4)
MCV: 89.2 fL (ref 80–97)
MPV: 7.7 fL (ref 0–99.8)
POC Granulocyte: 2.9 (ref 2–6.9)
POC LYMPH PERCENT: 32.3 %L (ref 10–50)
Platelet Count, POC: 206 10*3/uL (ref 142–424)
RBC: 4 M/uL — AB (ref 4.69–6.13)
RDW, POC: 13.8 %
WBC: 5.1 10*3/uL (ref 4.6–10.2)

## 2014-08-11 MED ORDER — ALLOPURINOL 100 MG PO TABS: 100.0000 mg | ORAL_TABLET | Freq: Every day | ORAL | Status: DC

## 2014-08-11 MED ORDER — DOCUSATE SODIUM 100 MG PO CAPS: ORAL_CAPSULE | ORAL | Status: DC

## 2014-08-11 MED ORDER — HYDROCORTISONE 2.5 % RE CREA: 1.0000 "application " | TOPICAL_CREAM | Freq: Two times a day (BID) | RECTAL | Status: DC

## 2014-08-11 MED ORDER — SERTRALINE HCL 50 MG PO TABS: 50.0000 mg | ORAL_TABLET | Freq: Every day | ORAL | Status: DC

## 2014-08-11 NOTE — Progress Notes (Signed)
   Subjective:    Patient ID: Travis Padilla, male    DOB: 22-Sep-1945, 68 y.o.   MRN: [id number]  HPI Patient is here for follow up.  He was told when he was going to donate blood that he has anemia and his hgb was 11.0.  He has been taking iron po qd because in the past he was told he has IDA.  He has been having anxiety sx's.  He has been having constipation and hemorrhoids  Review of Systems  Constitutional: Negative for fever.  HENT: Negative for ear pain.   Eyes: Negative for discharge.  Respiratory: Negative for cough.   Cardiovascular: Negative for chest pain.  Gastrointestinal: Negative for abdominal distention.  Endocrine: Negative for polyuria.  Genitourinary: Negative for difficulty urinating.  Musculoskeletal: Negative for gait problem and neck pain.  Skin: Negative for color change and rash.  Neurological: Negative for speech difficulty and headaches.  Psychiatric/Behavioral: Negative for agitation.       Objective:    BP 133/77 mmHg  Pulse 60  Temp(Src) 98.7 F (37.1 C) (Oral)  Ht 6' 1.5" (1.867 m)  Wt 247 lb (112.038 kg)  BMI 32.14 kg/m2 Physical Exam  Constitutional: He is oriented to person, place, and time. He appears well-developed and well-nourished.  HENT:  Head: Normocephalic and atraumatic.  Mouth/Throat: Oropharynx is clear and moist.  Eyes: Pupils are equal, round, and reactive to light.  Neck: Normal range of motion. Neck supple.  Cardiovascular: Normal rate and regular rhythm.   No murmur heard. Pulmonary/Chest: Effort normal and breath sounds normal.  Abdominal: Soft. Bowel sounds are normal. There is no tenderness.  Neurological: He is alert and oriented to person, place, and time.  Skin: Skin is warm and dry.  Psychiatric: He has a normal mood and affect.          Assessment & Plan:     ICD-9-CM ICD-10-CM   1. Other iron deficiency anemias 280.8 D50.8 POCT CBC     Ambulatory referral to Gastroenterology     Anemia panel   docusate sodium (COLACE) 100 MG capsule     Anemia Profile B  2. Other constipation 564.09 K59.09 docusate sodium (COLACE) 100 MG capsule     Anemia Profile B  3. Other hemorrhoids 455.6 K64.8 hydrocortisone (PROCTOSOL HC) 2.5 % rectal cream     Anemia Profile B  4. GAD (generalized anxiety disorder) 300.02 F41.1 sertraline (ZOLOFT) 50 MG tablet     Anemia Profile B     Return if symptoms worsen or fail to improve.  Lysbeth Penner FNP

## 2014-08-12 ENCOUNTER — Other Ambulatory Visit: Payer: Self-pay | Admitting: Family Medicine

## 2014-08-12 LAB — ANEMIA PROFILE B
Basophils Absolute: 0 10*3/uL (ref 0.0–0.2)
Basos: 0 %
Eos: 3 %
Eosinophils Absolute: 0.1 10*3/uL (ref 0.0–0.4)
Ferritin: 26 ng/mL — ABNORMAL LOW (ref 30–400)
Folate: >19.9 ng/mL (ref >3.0)
HCT: 37 % — ABNORMAL LOW (ref 37.5–51.0)
Hemoglobin: 12 g/dL — ABNORMAL LOW (ref 12.6–17.7)
Immature Grans (Abs): 0 10*3/uL (ref 0.0–0.1)
Immature Granulocytes: 0 %
Iron Saturation: 21 % (ref 15–55)
Iron: 75 ug/dL (ref 40–155)
Lymphocytes Absolute: 1.6 10*3/uL (ref 0.7–3.1)
Lymphs: 32 %
MCH: 29.9 pg (ref 26.6–33.0)
MCHC: 32.4 g/dL (ref 31.5–35.7)
MCV: 92 fL (ref 79–97)
Monocytes Absolute: 0.6 10*3/uL (ref 0.1–0.9)
Monocytes: 13 %
Neutrophils Absolute: 2.5 10*3/uL (ref 1.4–7.0)
Neutrophils Relative %: 52 %
Platelets: 215 10*3/uL (ref 150–379)
RBC: 4.02 x10E6/uL — ABNORMAL LOW (ref 4.14–5.80)
RDW: 14.1 % (ref 12.3–15.4)
Retic Ct Pct: 0.8 % (ref 0.6–2.6)
TIBC: 361 ug/dL (ref 250–450)
UIBC: 286 ug/dL (ref 150–375)
Vitamin B-12: 286 pg/mL (ref 211–946)
WBC: 4.9 10*3/uL (ref 3.4–10.8)

## 2014-08-12 MED ORDER — CYANOCOBALAMIN 1000 MCG/ML IJ SOLN: INTRAMUSCULAR | Status: DC

## 2014-08-13 ENCOUNTER — Other Ambulatory Visit: Payer: Self-pay

## 2014-08-13 ENCOUNTER — Ambulatory Visit (INDEPENDENT_AMBULATORY_CARE_PROVIDER_SITE_OTHER): Payer: Medicare Other | Admitting: *Deleted

## 2014-08-13 ENCOUNTER — Ambulatory Visit: Payer: Medicare Other

## 2014-08-13 DIAGNOSIS — D649 Anemia, unspecified: Secondary | ICD-10-CM

## 2014-08-13 DIAGNOSIS — E538 Deficiency of other specified B group vitamins: Secondary | ICD-10-CM

## 2014-08-13 MED ORDER — CYANOCOBALAMIN 1000 MCG/ML IJ SOLN
1000.0000 ug | Freq: Every day | INTRAMUSCULAR | Status: AC
  Administered 2014-08-13 – 2014-08-18 (×4): 1000 ug via INTRAMUSCULAR

## 2014-08-13 NOTE — Patient Instructions (Signed)
Vitamin B12 Injections Every person needs vitamin B12. A deficiency develops when the body does not get enough of it. One way to overcome this is by getting B12 shots (injections). A B12 shot puts the vitamin directly into muscle tissue. This avoids any problems your body might have in absorbing it from food or a pill. In some people, the body has trouble using the vitamin correctly. This can cause a B12 deficiency. Not consuming enough of the vitamin can also cause a deficiency. Getting enough vitamin B12 can be hard for elderly people. Sometimes, they do not eat a well-balanced diet. The elderly are also more likely than younger people to have medical conditions or take medications that can lead to a deficiency. WHAT DOES VITAMIN B12 DO? Vitamin B12 does many things to help the body work right:  It helps the body make healthy red blood cells.  It helps maintain nerve cells.  It is involved in the body's process of converting food into energy (metabolism).  It is needed to make the genetic material in all cells (DNA). VITAMIN B12 FOOD SOURCES Most people get plenty of vitamin B12 through the foods they eat. It is present in:  Meat, fish, poultry, and eggs.  Milk and milk products.  It also is added when certain foods are made, including some breads, cereals and yogurts. The food is then called "fortified". CAUSES The most common causes of vitamin B12 deficiency are:  Pernicious anemia. The condition develops when the body cannot make enough healthy red blood cells. This stems from a lack of a protein made in the stomach (intrinsic factor). People without this protein cannot absorb enough vitamin B12 from food.  Malabsorption. This is when the body cannot absorb the vitamin. It can be caused by:  Pernicious anemia.  Surgery to remove part or all of the stomach can lead to malabsorption. Removal of part or all of the small intestine can also cause malabsorption.  Vegetarian diet.  People who are strict about not eating foods from animals could have trouble taking in enough vitamin B12 from diet alone.  Medications. Some medicines have been linked to B12 deficiency, such as Metformin (a drug prescribed for type 2 diabetes). Long-term use of stomach acid suppressants also can keep the vitamin from being absorbed.  Intestinal problems such as inflammatory bowel disease. If there are problems in the digestive tract, vitamin B12 may not be absorbed in good enough amounts. SYMPTOMS People who do not get enough B12 can develop problems. These can include:  Anemia. This is when the body has too few red blood cells. Red blood cells carry oxygen to the rest of the body. Without a healthy supply of red blood cells, people can feel:  Tired (fatigued).  Weak.  Severe anemia can cause:  Shortness of breath.  Dizziness.  Rapid heart rate.  Paleness.  Other Vitamin B12 deficiency symptoms include:  Diarrhea.  Numbness or tingling in the hands or feet.  Loss of appetite.  Confusion.  Sores on the tongue or in the mouth. LET YOUR CAREGIVER KNOW ABOUT:  Any allergies. It is very important to know if you are allergic or sensitive to cobalt. Vitamin B12 contains cobalt.  Any history of kidney disease.  All medications you are taking. Include prescription and over-the-counter medicines, herbs and creams.  Whether you are pregnant or breast-feeding.  If you have Leber's disease, a hereditary eye condition, vitamin B12 could make it worse. RISKS AND COMPLICATIONS Reactions to an injection are   usually temporary. They might include:  Pain at the injection site.  Redness, swelling or tenderness at the site.  Headache, dizziness or weakness.  Nausea, upset stomach or diarrhea.  Numbness or tingling.  Fever.  Joint pain.  Itching or rash. If a reaction does not go away in a short while, talk with your healthcare provider. A change in the way the shots are  given, or where they are given, might need to be made. BEFORE AN INJECTION To decide whether B12 injections are right for you, your healthcare provider will probably:  Ask about your medical history.  Ask questions about your diet.  Ask about symptoms such as:  Have you felt weak?  Do you feel unusually tired?  Do you get dizzy?  Order blood tests. These may include a test to:  Check the level of red cells in your blood.  Measure B12 levels.  Check for the presence of intrinsic factor. VITAMIN B12 INJECTIONS How often you will need a vitamin B12 injection will depend on how severe your deficiency is. This also will affect how long you will need to get them. People with pernicious anemia usually get injections for their entire life. Others might get them for a shorter period. For many people, injections are given daily or weekly for several weeks. Then, once B12 levels are normal, injections are given just once a month. If the cause of the deficiency can be fixed, the injections can be stopped. Talk with your healthcare provider about what you should expect. For an injection:  The injection site will be cleaned with an alcohol swab.  Your healthcare provider will insert a needle directly into a muscle. Most any muscle can be used. Most often, an arm muscle is used. A buttocks muscle can also be used. Many people say shots in that area are less painful.  A small adhesive bandage may be put over the injection site. It usually can be taken off in an hour or less. Injections can be given by your healthcare provider. In some cases, family members give them. Sometimes, people give them to themselves. Talk with your healthcare provider about what would be best for you. If someone other than your healthcare provider will be giving the shots, the person will need to be trained to give them correctly. HOME CARE INSTRUCTIONS   You can remove the adhesive bandage within an hour of getting a  shot.  You should be able to go about your normal activities right away.  Avoid drinking large amounts of alcohol while taking vitamin B12 shots. Alcohol can interfere with the body's use of the vitamin. SEEK MEDICAL CARE IF:   Pain, redness, swelling or tenderness at the injection site does not get better or gets worse.  Headache, dizziness or weakness does not go away.  You develop a fever of more than 100.5 F (38.1 C). SEEK IMMEDIATE MEDICAL CARE IF:   You have chest pain.  You develop shortness of breath.  You have muscle weakness that gets worse.  You develop numbness, weakness or tingling on one side or one area of the body.  You have symptoms of an allergic reaction, such as:  Hives.  Difficulty breathing.  Swelling of the lips, face, tongue or throat.  You develop a fever of more than 102.0 F (38.9 C). MAKE SURE YOU:   Understand these instructions.  Will watch your condition.  Will get help right away if you are not doing well or get worse. Document   Released: 11/23/2008 Document Revised: 11/19/2011 Document Reviewed: 11/23/2008 ExitCare Patient Information 2015 [employer]. This information is not intended to replace advice given to you by your health care provider. Make sure you discuss any questions you have with your health care provider.  

## 2014-08-13 NOTE — Progress Notes (Signed)
Vitamin b12 given and tolerated well.

## 2014-08-16 ENCOUNTER — Ambulatory Visit (INDEPENDENT_AMBULATORY_CARE_PROVIDER_SITE_OTHER): Payer: Medicare Other | Admitting: *Deleted

## 2014-08-16 DIAGNOSIS — E538 Deficiency of other specified B group vitamins: Secondary | ICD-10-CM

## 2014-08-17 ENCOUNTER — Ambulatory Visit (INDEPENDENT_AMBULATORY_CARE_PROVIDER_SITE_OTHER): Payer: Medicare Other | Admitting: *Deleted

## 2014-08-17 DIAGNOSIS — E538 Deficiency of other specified B group vitamins: Secondary | ICD-10-CM

## 2014-08-17 NOTE — Patient Instructions (Signed)

## 2014-08-17 NOTE — Progress Notes (Signed)
Vitamin B12 given and tolerated well.

## 2014-08-18 ENCOUNTER — Ambulatory Visit (INDEPENDENT_AMBULATORY_CARE_PROVIDER_SITE_OTHER): Payer: Medicare Other | Admitting: *Deleted

## 2014-08-18 DIAGNOSIS — E538 Deficiency of other specified B group vitamins: Secondary | ICD-10-CM

## 2014-08-18 NOTE — Patient Instructions (Signed)

## 2014-08-18 NOTE — Progress Notes (Signed)
Vitamin b12 injection given and tolerated well.  

## 2014-08-19 ENCOUNTER — Ambulatory Visit (INDEPENDENT_AMBULATORY_CARE_PROVIDER_SITE_OTHER): Payer: Medicare Other | Admitting: *Deleted

## 2014-08-19 DIAGNOSIS — E538 Deficiency of other specified B group vitamins: Secondary | ICD-10-CM | POA: Diagnosis not present

## 2014-08-19 MED ORDER — CYANOCOBALAMIN 1000 MCG/ML IJ SOLN: 1000.0000 ug | INTRAMUSCULAR | Status: AC

## 2014-08-19 NOTE — Progress Notes (Signed)
Vitamin b12 injection given and tolerated well.  

## 2014-08-19 NOTE — Patient Instructions (Signed)

## 2014-08-20 ENCOUNTER — Ambulatory Visit: Payer: Medicare Other

## 2014-08-25 ENCOUNTER — Ambulatory Visit (INDEPENDENT_AMBULATORY_CARE_PROVIDER_SITE_OTHER): Payer: Medicare Other | Admitting: *Deleted

## 2014-08-25 ENCOUNTER — Other Ambulatory Visit: Payer: Self-pay | Admitting: *Deleted

## 2014-08-25 DIAGNOSIS — K648 Other hemorrhoids: Secondary | ICD-10-CM

## 2014-08-25 DIAGNOSIS — E538 Deficiency of other specified B group vitamins: Secondary | ICD-10-CM

## 2014-08-25 DIAGNOSIS — F411 Generalized anxiety disorder: Secondary | ICD-10-CM

## 2014-08-25 MED ORDER — HYDROCORTISONE 2.5 % RE CREA: 1.0000 "application " | TOPICAL_CREAM | Freq: Two times a day (BID) | RECTAL | Status: DC

## 2014-08-25 MED ORDER — LORAZEPAM 1 MG PO TABS: 1.0000 mg | ORAL_TABLET | Freq: Every day | ORAL | Status: DC

## 2014-08-25 MED ORDER — CYANOCOBALAMIN 1000 MCG/ML IJ SOLN
1000.0000 ug | INTRAMUSCULAR | Status: AC
  Administered 2014-08-25 – 2014-09-15 (×4): 1000 ug via INTRAMUSCULAR

## 2014-08-25 MED ORDER — SERTRALINE HCL 50 MG PO TABS: 50.0000 mg | ORAL_TABLET | Freq: Every day | ORAL | Status: DC

## 2014-08-25 NOTE — Patient Instructions (Signed)

## 2014-08-25 NOTE — Progress Notes (Signed)
Vitamin b12 injection given and tolerated well.  

## 2014-08-25 NOTE — Telephone Encounter (Signed)
Pt is requesting this prescription be sent to mail in pharmacy at [facility]. If approved it will print and the pt will need to pick up prescription.

## 2014-08-25 NOTE — Telephone Encounter (Signed)
I'm not sure which prescription the request is for

## 2014-08-25 NOTE — Telephone Encounter (Signed)
Pt requesting prescriptions be sent to [facility].

## 2014-08-26 ENCOUNTER — Telehealth: Payer: Self-pay | Admitting: Family Medicine

## 2014-08-26 NOTE — Telephone Encounter (Signed)
Ativan Rx ready for pickup

## 2014-08-30 DIAGNOSIS — H02413 Mechanical ptosis of bilateral eyelids: Secondary | ICD-10-CM | POA: Diagnosis not present

## 2014-08-30 DIAGNOSIS — H53483 Generalized contraction of visual field, bilateral: Secondary | ICD-10-CM | POA: Diagnosis not present

## 2014-09-01 ENCOUNTER — Ambulatory Visit (INDEPENDENT_AMBULATORY_CARE_PROVIDER_SITE_OTHER): Payer: Medicare Other

## 2014-09-01 DIAGNOSIS — E538 Deficiency of other specified B group vitamins: Secondary | ICD-10-CM

## 2014-09-08 ENCOUNTER — Ambulatory Visit (INDEPENDENT_AMBULATORY_CARE_PROVIDER_SITE_OTHER): Payer: Medicare Other | Admitting: *Deleted

## 2014-09-08 DIAGNOSIS — E538 Deficiency of other specified B group vitamins: Secondary | ICD-10-CM

## 2014-09-08 NOTE — Progress Notes (Signed)
Pt given B12 injection IM left deltoid, pt tolerated injection well.

## 2014-09-08 NOTE — Patient Instructions (Signed)

## 2014-09-15 ENCOUNTER — Ambulatory Visit (INDEPENDENT_AMBULATORY_CARE_PROVIDER_SITE_OTHER): Payer: Medicare Other | Admitting: *Deleted

## 2014-09-15 DIAGNOSIS — E538 Deficiency of other specified B group vitamins: Secondary | ICD-10-CM | POA: Diagnosis not present

## 2014-09-15 NOTE — Patient Instructions (Signed)

## 2014-09-15 NOTE — Progress Notes (Signed)
Vitamin b12 injection given and tolerated well.  

## 2014-09-16 DIAGNOSIS — L03032 Cellulitis of left toe: Secondary | ICD-10-CM | POA: Diagnosis not present

## 2014-09-16 DIAGNOSIS — M79675 Pain in left toe(s): Secondary | ICD-10-CM | POA: Diagnosis not present

## 2014-09-20 ENCOUNTER — Ambulatory Visit: Payer: Medicare Other | Admitting: Nurse Practitioner

## 2014-09-23 DIAGNOSIS — M25512 Pain in left shoulder: Secondary | ICD-10-CM | POA: Diagnosis not present

## 2014-09-28 ENCOUNTER — Ambulatory Visit (INDEPENDENT_AMBULATORY_CARE_PROVIDER_SITE_OTHER): Payer: Medicare Other | Admitting: Internal Medicine

## 2014-09-28 ENCOUNTER — Ambulatory Visit: Payer: Medicare Other | Attending: Orthopedic Surgery | Admitting: Physical Therapy

## 2014-09-28 ENCOUNTER — Telehealth (INDEPENDENT_AMBULATORY_CARE_PROVIDER_SITE_OTHER): Payer: Self-pay | Admitting: *Deleted

## 2014-09-28 ENCOUNTER — Encounter (INDEPENDENT_AMBULATORY_CARE_PROVIDER_SITE_OTHER): Payer: Self-pay | Admitting: Internal Medicine

## 2014-09-28 VITALS — BP 118/70 | HR 64 | Temp 97.7°F | Ht 74.0 in | Wt 244.7 lb

## 2014-09-28 DIAGNOSIS — I10 Essential (primary) hypertension: Secondary | ICD-10-CM | POA: Diagnosis not present

## 2014-09-28 DIAGNOSIS — K625 Hemorrhage of anus and rectum: Secondary | ICD-10-CM

## 2014-09-28 DIAGNOSIS — D508 Other iron deficiency anemias: Secondary | ICD-10-CM

## 2014-09-28 DIAGNOSIS — Z1211 Encounter for screening for malignant neoplasm of colon: Secondary | ICD-10-CM

## 2014-09-28 DIAGNOSIS — M5030 Other cervical disc degeneration, unspecified cervical region: Secondary | ICD-10-CM | POA: Diagnosis not present

## 2014-09-28 DIAGNOSIS — M47892 Other spondylosis, cervical region: Secondary | ICD-10-CM | POA: Diagnosis not present

## 2014-09-28 DIAGNOSIS — M25512 Pain in left shoulder: Secondary | ICD-10-CM | POA: Insufficient documentation

## 2014-09-28 NOTE — Patient Instructions (Signed)
Colonoscopy. The risks and benefits such as perforation, bleeding, and infection were reviewed with the patient and is agreeable.The risks and benefits such as perforation, bleeding, and infection were reviewed with the patient and is agreeable.

## 2014-09-28 NOTE — Progress Notes (Addendum)
Subjective:    Patient ID: Travis Padilla, male    DOB: 12/06/1945, 69 y.o.   MRN: [id number]  HPI Referred to our office for anemia by Dr. Sallyanne Havers. He tells me he occasionally has rectal bleeding which started in November. The bleeding occurred for about a week and then will resolve. Has occurred off an on for the past year. There has been no changes in his stools.  No melena. He usually has a BM avery other day. Appetite is fair. No weight loss. No abdominal pain except when he has a BM.   12/06/2008 Colonoscopy: Dr. [name]:intermittent hematochezia, constipation, and lower abdominal pain. Last colonoscopy was in 2010. Normal terminal ileum. 96mm polyp ablated via cold biopsy from proximal transverse colon. Few tiny diveticula at sigmoid colon. Internal/external hemorrhoids. Suspect his intermittent lower abdominal pain and hematochezia are secondary to constipation.  Biopsy: Tubular adenoma.    CBC Latest Ref Rng 08/11/2014 08/11/2014 06/18/2014  WBC 3.4 - 10.8 x10E3/uL 4.9 5.1 7.4  Hemoglobin 12.6 - 17.7 g/dL 12.0(L) 12.0(A) 13.8(A)  Hematocrit 37.5 - 51.0 % 37.0(L) 35.9(A) 41.3(A)  Platelets 150 - 379 x10E3/uL 215 - -   08/12/2014 TIBC 361, UIBC 286, Iron 75, Sat 21,Ferritin 26, Vitamin B 12 286, Folate greater than 19.9 CMP     Component Value Date/Time   NA 145* 08/04/2014 1152   NA 136 08/21/2012 1514   K 4.7 08/04/2014 1152   CL 107 08/04/2014 1152   CO2 25 08/04/2014 1152   GLUCOSE 91 08/04/2014 1152   GLUCOSE 99 08/21/2012 1514   BUN 11 08/04/2014 1152   BUN 13 08/21/2012 1514   CREATININE 0.90 08/04/2014 1152   CALCIUM 9.1 08/04/2014 1152   PROT 6.6 08/04/2014 1152   AST 15 08/04/2014 1152   ALT 12 08/04/2014 1152   ALKPHOS 52 08/04/2014 1152   BILITOT 0.4 08/04/2014 1152   GFRNONAA 88 08/04/2014 1152   GFRAA 102 08/04/2014 1152        Review of Systems Past Medical History  Diagnosis Date  . Hypertension   . Gout   . 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  Hemorrhoid   . Dizziness   . Racing heart beat   . Chest pain     December, 2013  . Abnormal nuclear stress test     December, 2013  . CAD (coronary artery disease)     Mild nonobstructive plaque in cath 2013  . Hyperlipidemia     Past Surgical History  Procedure Laterality Date  . Knee cartilage surgery      Left knee    No Known Allergies  Current Outpatient Prescriptions on File Prior to Visit  Medication Sig Dispense Refill  . acetic acid (VOSOL) 2 % otic solution Place 4 drops into both ears 3 (three) times daily. 15 mL 1  . allopurinol (ZYLOPRIM) 100 MG tablet Take 1 tablet (100 mg total) by mouth daily. 90 tablet 3  . amLODipine (NORVASC) 5 MG tablet Take 1 tablet by mouth daily.  1  . carvedilol (COREG) 6.25 MG tablet Take 1.5 tablets (9.375 mg total) by mouth 2 (two) times daily with a meal. 270 tablet 3  . cholecalciferol (VITAMIN D) 1000 UNITS tablet Take 1,000 Units by mouth daily.    . cyanocobalamin (,VITAMIN B-12,) 1000 MCG/ML injection Inject one ML IM daily for a week then weekly for 4 weeks then monthly 10 mL 1  . docusate sodium (COLACE) 100 MG capsule  Take 2 po qhs (Patient taking differently: As needed) 60 capsule 11  . hydrocortisone (PROCTOSOL HC) 2.5 % rectal cream Place 1 application rectally 2 (two) times daily. (Patient taking differently: Place 1 application rectally as needed. ) 30 g 11  . Iron-Vit C-Vit B12-Folic Acid (IRON 098 PLUS PO) Take 1 tablet by mouth daily.    Marland Kitchen LORazepam (ATIVAN) 1 MG tablet Take 1 tablet (1 mg total) by mouth at bedtime. 90 tablet 0  . Multiple Vitamin (MULTIVITAMIN) capsule Take 1 capsule by mouth daily.    . pravastatin (PRAVACHOL) 40 MG tablet Take 1 tablet by mouth  daily 90 tablet 2  . sertraline (ZOLOFT) 50 MG tablet Take 1 tablet (50 mg total) by mouth daily. 30 tablet 11  . temazepam (RESTORIL) 30 MG capsule Take 1 capsule by mouth at bedtime as needed.    . zolpidem (AMBIEN) 5 MG tablet   0   No current  facility-administered medications on file prior to visit.        Objective:   Physical Exam  Filed Vitals:   09/28/14 1501  Height: 6\' 2"  (1.88 m)  Weight: 244 lb 11.2 oz (110.995 kg)    Alert and oriented. Skin warm and dry. Oral mucosa is moist.   . Sclera anicteric, conjunctivae is pink. Thyroid not enlarged. No cervical lymphadenopathy. Lungs clear. Heart regular rate and rhythm.  Abdomen is soft. Bowel sounds are positive. No hepatomegaly. No abdominal masses felt. No tenderness.  No edema to lower extremities.         Assessment & Plan:  Rectal bleeding: Colonic neoplasm needs to be ruled out. ? Hemorrhoidal, or polyp. Ferritin is slightly low.  Colonoscopy. The risks and benefits such as perforation, bleeding, and infection were reviewed with the patient and is agreeable.

## 2014-09-28 NOTE — Telephone Encounter (Signed)
Patient needs trilyte 

## 2014-09-29 MED ORDER — PEG 3350-KCL-NA BICARB-NACL 420 G PO SOLR: 4000.0000 mL | Freq: Once | ORAL | Status: DC

## 2014-09-30 ENCOUNTER — Ambulatory Visit: Payer: Medicare Other | Admitting: Physical Therapy

## 2014-09-30 ENCOUNTER — Encounter (INDEPENDENT_AMBULATORY_CARE_PROVIDER_SITE_OTHER): Payer: Self-pay

## 2014-09-30 DIAGNOSIS — M5030 Other cervical disc degeneration, unspecified cervical region: Secondary | ICD-10-CM | POA: Diagnosis not present

## 2014-09-30 DIAGNOSIS — L03031 Cellulitis of right toe: Secondary | ICD-10-CM | POA: Diagnosis not present

## 2014-09-30 DIAGNOSIS — M25512 Pain in left shoulder: Secondary | ICD-10-CM | POA: Diagnosis not present

## 2014-09-30 DIAGNOSIS — I10 Essential (primary) hypertension: Secondary | ICD-10-CM | POA: Diagnosis not present

## 2014-09-30 DIAGNOSIS — M47892 Other spondylosis, cervical region: Secondary | ICD-10-CM | POA: Diagnosis not present

## 2014-10-05 ENCOUNTER — Ambulatory Visit: Payer: Medicare Other | Admitting: *Deleted

## 2014-10-05 DIAGNOSIS — M25512 Pain in left shoulder: Secondary | ICD-10-CM | POA: Diagnosis not present

## 2014-10-05 DIAGNOSIS — M47892 Other spondylosis, cervical region: Secondary | ICD-10-CM | POA: Diagnosis not present

## 2014-10-05 DIAGNOSIS — M5030 Other cervical disc degeneration, unspecified cervical region: Secondary | ICD-10-CM | POA: Diagnosis not present

## 2014-10-05 DIAGNOSIS — I10 Essential (primary) hypertension: Secondary | ICD-10-CM | POA: Diagnosis not present

## 2014-10-07 ENCOUNTER — Ambulatory Visit: Payer: Medicare Other | Admitting: *Deleted

## 2014-10-07 DIAGNOSIS — M5030 Other cervical disc degeneration, unspecified cervical region: Secondary | ICD-10-CM | POA: Diagnosis not present

## 2014-10-07 DIAGNOSIS — M25512 Pain in left shoulder: Secondary | ICD-10-CM | POA: Diagnosis not present

## 2014-10-07 DIAGNOSIS — M47892 Other spondylosis, cervical region: Secondary | ICD-10-CM | POA: Diagnosis not present

## 2014-10-07 DIAGNOSIS — I10 Essential (primary) hypertension: Secondary | ICD-10-CM | POA: Diagnosis not present

## 2014-10-12 ENCOUNTER — Ambulatory Visit: Payer: Medicare Other | Attending: Orthopedic Surgery | Admitting: *Deleted

## 2014-10-12 DIAGNOSIS — M542 Cervicalgia: Secondary | ICD-10-CM | POA: Insufficient documentation

## 2014-10-13 ENCOUNTER — Ambulatory Visit: Payer: Medicare Other | Admitting: Family Medicine

## 2014-10-14 ENCOUNTER — Ambulatory Visit: Payer: Medicare Other | Admitting: *Deleted

## 2014-10-14 DIAGNOSIS — M542 Cervicalgia: Secondary | ICD-10-CM | POA: Diagnosis not present

## 2014-10-18 ENCOUNTER — Other Ambulatory Visit (INDEPENDENT_AMBULATORY_CARE_PROVIDER_SITE_OTHER): Payer: Self-pay | Admitting: *Deleted

## 2014-10-18 ENCOUNTER — Other Ambulatory Visit: Payer: Self-pay | Admitting: Ophthalmology

## 2014-10-18 DIAGNOSIS — D485 Neoplasm of uncertain behavior of skin: Secondary | ICD-10-CM | POA: Diagnosis not present

## 2014-10-18 DIAGNOSIS — C44112 Basal cell carcinoma of skin of right eyelid, including canthus: Secondary | ICD-10-CM | POA: Diagnosis not present

## 2014-10-18 DIAGNOSIS — L905 Scar conditions and fibrosis of skin: Secondary | ICD-10-CM | POA: Diagnosis not present

## 2014-10-18 DIAGNOSIS — H02823 Cysts of right eye, unspecified eyelid: Secondary | ICD-10-CM | POA: Diagnosis not present

## 2014-10-18 DIAGNOSIS — C4499 Other specified malignant neoplasm of skin, unspecified: Secondary | ICD-10-CM | POA: Diagnosis not present

## 2014-10-18 DIAGNOSIS — K625 Hemorrhage of anus and rectum: Secondary | ICD-10-CM

## 2014-10-18 DIAGNOSIS — H02403 Unspecified ptosis of bilateral eyelids: Secondary | ICD-10-CM | POA: Diagnosis not present

## 2014-10-18 DIAGNOSIS — D649 Anemia, unspecified: Secondary | ICD-10-CM

## 2014-10-18 DIAGNOSIS — H02413 Mechanical ptosis of bilateral eyelids: Secondary | ICD-10-CM | POA: Diagnosis not present

## 2014-10-18 DIAGNOSIS — C44319 Basal cell carcinoma of skin of other parts of face: Secondary | ICD-10-CM | POA: Diagnosis not present

## 2014-10-18 DIAGNOSIS — L720 Epidermal cyst: Secondary | ICD-10-CM | POA: Diagnosis not present

## 2014-10-20 ENCOUNTER — Ambulatory Visit: Payer: Medicare Other

## 2014-10-21 ENCOUNTER — Encounter: Payer: Medicare Other | Admitting: *Deleted

## 2014-10-26 ENCOUNTER — Encounter: Payer: Medicare Other | Admitting: Physical Therapy

## 2014-10-27 ENCOUNTER — Ambulatory Visit: Payer: Medicare Other

## 2014-11-02 ENCOUNTER — Encounter: Payer: Self-pay | Admitting: Physical Therapy

## 2014-11-02 ENCOUNTER — Ambulatory Visit: Payer: Medicare Other | Admitting: Physical Therapy

## 2014-11-02 DIAGNOSIS — M542 Cervicalgia: Secondary | ICD-10-CM

## 2014-11-02 NOTE — Patient Instructions (Signed)
Strengthening: Flexion - Isometric (in Neutral)   Using light pressure from fingertips at forehead, resist bending head forward. Hold __5__ seconds. Repeat __5__ times per set. Do __2__ sets per session. Do __2__ sessions per day.  http://orth.exer.us/306   Copyright  VHI. All rights reserved.  Strengthening: Lateral Bend - Isometric (in Neutral)   Using light pressure from fingertips, press into right temple. Resist bending head sideways. Hold __5__ seconds. Repeat __5__ times per set. Do ___2_ sets per session. Do _2___ sessions per day.  http://orth.exer.us/302   Copyright  VHI. All rights reserved.  Strengthening: Extension - Isometric (in Neutral)   Using light pressure from fingertips at back of head, resist bending head backward. Hold _5___ seconds. Repeat _5___ times per set. Do __2__ sets per session. Do ____ sessions per day.  http://orth.exer.us/308   Copyright  VHI. All rights reserved.  Flexibility: Neck Retraction   Pull head straight back, keeping eyes and jaw level. Repeat _5___ times per set. Do _2__ sets per session. Do _2___ sessions per day.  http://orth.exer.us/344   Copyright  VHI. All rights reserved.  Strengthening: Flexion - Isometric (in Neutral)

## 2014-11-02 NOTE — Therapy (Signed)
[facility] [place], [place], [address] Phone: [phone]   Fax:  [phone]  Physical Therapy Treatment  Patient Details  Name: Travis Padilla MRN: [id number] Date of Birth: [birth year] Referring Provider:  Claretta Fraise, MD  Encounter Date: 11/02/2014      PT End of Session - 11/02/14 0955    Visit Number 7   Number of Visits 8   PT Start Time 0859   PT Stop Time 0945   PT Time Calculation (min) 46 min      Past Medical History  Diagnosis Date  . Hypertension   . Gout   . 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 Hemorrhoid   . Dizziness   . Racing heart beat   . Chest pain     December, 2013  . Abnormal nuclear stress test     December, 2013  . CAD (coronary artery disease)     Mild nonobstructive plaque in cath 2013  . Hyperlipidemia     Past Surgical History  Procedure Laterality Date  . Knee cartilage surgery      Left knee    There were no vitals taken for this visit.  Visit Diagnosis:  Neck pain      Subjective Assessment - 11/02/14 0935    Symptoms Pt feels 30 % better overall, continues to have some deep numbing in left shoulder 0 up to 5/10 Intermittently   Multiple Pain Sites No          [facility] PT Assessment - 11/02/14 0001    ROM / Strength   AROM / PROM / Strength AROM   AROM   Overall AROM  Deficits   AROM Assessment Site Cervical   Cervical - Right Rotation 65   Cervical - Left Rotation 65                  [facility] Adult PT Treatment/Exercise - 11/02/14 0001    Modalities   Modalities Ultrasound;Traction   Ultrasound   Ultrasound Location Lt Shoulder UT/Scap border   Ultrasound Parameters 1.5w/cm2/100%/66mz  combo ES   Ultrasound Goals Pain   Traction   Type of Traction Cervical   Min (lbs) 5   Max (lbs) 27   Hold Time 99   Rest Time 5   Time 15   Manual Therapy   Manual Therapy Myofascial release   Myofascial Release Lt c-spine/UT/scap border                 PT Education - 11/02/14 0954    Education provided Yes   Education Details Posture ex's   Person(s) Educated Patient   Methods Explanation;Demonstration;Handout   Comprehension Verbalized understanding;Returned demonstration             PT Long Term Goals - 11/02/14 1003    PT LONG TERM GOAL #1   Title demonstrate and/or verbalize techniques to reduce the risk of re-injury to include info [id number]  Time 4   Period Weeks   Status Achieved  10/07/14   PT LONG TERM GOAL #2   Title be independent with advanced HEP   Time 4   Period Weeks   Status Achieved   PT LONG TERM GOAL #3   Title increase ROM with active bilateral cervical rotation to 70 degrees   Time 4   Period Weeks   Status On-going   PT LONG TERM GOAL #4   Title tolerate standing for 20 minutes  with pain not >3/10   Time 4   Period Weeks   Status On-going   PT LONG TERM GOAL #5   Title tolerate sitting for 20 minutes with pain not >3/10   Time 4   Period Weeks   Status Achieved  10/14/14   Additional Long Term Goals   Additional Long Term Goals Yes   PT LONG TERM GOAL #6   Title sleep undisturbed 6 hours   Time 4   Period Weeks   Status Achieved   PT LONG TERM GOAL #7   Title perform ADL's with pain not > 3/10   Time 4   Period Weeks   Status On-going               Plan - 11/02/14 0957    Clinical Impression Statement pt tolerated tx well today with good response to US/traction, has little palpable pain today. Patient is able to sleep with no pain and understands HEP.Met LTG #2 and #6 others ongoing   PT Treatment/Interventions Cryotherapy;Moist Heat;ADLs/Self Care Home Management;Therapeutic activities;Patient/family education;Traction;Therapeutic exercise;Passive range of motion;Ultrasound;Manual techniques;Electrical Stimulation   PT Next Visit Plan Cont with POC for 1 visit then DC per pt has home traction unit        Problem List Patient Active Problem List    Diagnosis Date Noted  . Chest pain   . Abnormal nuclear stress test   . OBESITY, UNSPECIFIED 03/28/2009  . ESSENTIAL HYPERTENSION, BENIGN 03/28/2009  . PALPITATIONS 03/28/2009  . SNORING 03/28/2009    Jerine Surles P, PTA 11/02/2014, 10:14 AM  [facility] [address] [place], [place], [address] Phone: [phone]   Fax:  [phone]

## 2014-11-03 ENCOUNTER — Encounter: Payer: Medicare Other | Admitting: Family Medicine

## 2014-11-03 ENCOUNTER — Encounter (HOSPITAL_COMMUNITY): Payer: Self-pay | Admitting: *Deleted

## 2014-11-03 ENCOUNTER — Encounter (HOSPITAL_COMMUNITY)
Admission: RE | Disposition: A | Discharge status: Home / Self Care | Payer: Self-pay | Source: Ambulatory Visit | Attending: Internal Medicine

## 2014-11-03 ENCOUNTER — Ambulatory Visit (HOSPITAL_COMMUNITY)
Admission: RE | Admit: 2014-11-03 | Discharge: 2014-11-03 | Disposition: A | Discharge status: Home / Self Care | Payer: Medicare Other | Source: Ambulatory Visit | Attending: Internal Medicine | Admitting: Internal Medicine

## 2014-11-03 DIAGNOSIS — K625 Hemorrhage of anus and rectum: Secondary | ICD-10-CM

## 2014-11-03 DIAGNOSIS — Z79899 Other long term (current) drug therapy: Secondary | ICD-10-CM | POA: Insufficient documentation

## 2014-11-03 DIAGNOSIS — D649 Anemia, unspecified: Secondary | ICD-10-CM | POA: Insufficient documentation

## 2014-11-03 DIAGNOSIS — Z87891 Personal history of nicotine dependence: Secondary | ICD-10-CM | POA: Diagnosis not present

## 2014-11-03 DIAGNOSIS — K5730 Diverticulosis of large intestine without perforation or abscess without bleeding: Secondary | ICD-10-CM | POA: Diagnosis not present

## 2014-11-03 DIAGNOSIS — I10 Essential (primary) hypertension: Secondary | ICD-10-CM | POA: Insufficient documentation

## 2014-11-03 DIAGNOSIS — Z7982 Long term (current) use of aspirin: Secondary | ICD-10-CM | POA: Insufficient documentation

## 2014-11-03 DIAGNOSIS — Z79891 Long term (current) use of opiate analgesic: Secondary | ICD-10-CM | POA: Insufficient documentation

## 2014-11-03 DIAGNOSIS — E785 Hyperlipidemia, unspecified: Secondary | ICD-10-CM | POA: Insufficient documentation

## 2014-11-03 DIAGNOSIS — K644 Residual hemorrhoidal skin tags: Secondary | ICD-10-CM | POA: Insufficient documentation

## 2014-11-03 DIAGNOSIS — Z86010 Personal history of colonic polyps: Secondary | ICD-10-CM | POA: Diagnosis not present

## 2014-11-03 DIAGNOSIS — I2510 Atherosclerotic heart disease of native coronary artery without angina pectoris: Secondary | ICD-10-CM | POA: Diagnosis not present

## 2014-11-03 DIAGNOSIS — K648 Other hemorrhoids: Secondary | ICD-10-CM | POA: Diagnosis not present

## 2014-11-03 DIAGNOSIS — Z7952 Long term (current) use of systemic steroids: Secondary | ICD-10-CM | POA: Diagnosis not present

## 2014-11-03 DIAGNOSIS — M109 Gout, unspecified: Secondary | ICD-10-CM | POA: Diagnosis not present

## 2014-11-03 DIAGNOSIS — K5720 Diverticulitis of large intestine with perforation and abscess without bleeding: Secondary | ICD-10-CM | POA: Diagnosis not present

## 2014-11-03 HISTORY — PX: COLONOSCOPY: SHX5424

## 2014-11-03 LAB — HEMOGLOBIN AND HEMATOCRIT, BLOOD
HEMATOCRIT: 39.4 % (ref 39.0–52.0)
Hemoglobin: 13.1 g/dL (ref 13.0–17.0)

## 2014-11-03 SURGERY — COLONOSCOPY
Anesthesia: Moderate Sedation

## 2014-11-03 MED ORDER — MIDAZOLAM HCL 5 MG/5ML IJ SOLN
INTRAMUSCULAR | Status: DC | PRN
  Administered 2014-11-03: 2 mg via INTRAVENOUS
  Administered 2014-11-03: 3 mg via INTRAVENOUS
  Administered 2014-11-03 (×2): 2 mg via INTRAVENOUS

## 2014-11-03 MED ORDER — SODIUM CHLORIDE 0.9 % IV SOLN
INTRAVENOUS | Status: DC
  Administered 2014-11-03: 12:00:00 via INTRAVENOUS

## 2014-11-03 MED ORDER — SIMETHICONE 40 MG/0.6ML PO SUSP
ORAL | Status: DC | PRN
  Administered 2014-11-03: 13:00:00

## 2014-11-03 MED ORDER — BENEFIBER DRINK MIX PO PACK: 4.0000 g | PACK | Freq: Every day | ORAL | Status: DC

## 2014-11-03 MED ORDER — MEPERIDINE HCL 50 MG/ML IJ SOLN
INTRAMUSCULAR | Status: DC | PRN
  Administered 2014-11-03 (×2): 25 mg via INTRAVENOUS

## 2014-11-03 MED ORDER — MIDAZOLAM HCL 5 MG/5ML IJ SOLN
INTRAMUSCULAR | Status: AC
  Filled 2014-11-03: qty 10

## 2014-11-03 MED ORDER — MEPERIDINE HCL 50 MG/ML IJ SOLN
INTRAMUSCULAR | Status: AC
  Filled 2014-11-03: qty 1

## 2014-11-03 NOTE — H&P (Signed)
Travis Padilla is an 69 y.o. male.   Chief Complaint: Patient is here for colonoscopy. HPI: Patient is 69 year old Caucasian male who is here for diagnostic colonoscopy. He gives history of intermittent rectal bleeding. Most of the time he was see small amount of fresh blood with this bowel movements but on few occasions is an he would just pass blood and nothing else. He has been donating blood on regular basis. Last time he Travis Padilla for donation and his hemoglobin was was low and let further evaluation. He was noted to have low serum ferritin of 26. Serum iron was normal with saturation of 21%. Patient denies abdominal pain melena anorexia or weight loss. He is on full dose aspirin daily for prophylactic purposes. Last colonoscopy was in March 2010 with removal of small polyp and was tubular adenoma. Family history is negative for CRC.  Past Medical History  Diagnosis Date  . Hypertension   . Gout   . 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 Hemorrhoid   . Dizziness   . Racing heart beat   . Chest pain     December, 2013  . Abnormal nuclear stress test     December, 2013  . CAD (coronary artery disease)     Mild nonobstructive plaque in cath 2013  . Hyperlipidemia     Past Surgical History  Procedure Laterality Date  . Knee cartilage surgery Left     Left knee  . Cataract extraction w/ intraocular lens  implant, bilateral    . Belpharoptosis repair Bilateral     Family History  Problem Relation Age of Onset  . Heart attack Father   . Colon cancer Neg Hx    Social History:  reports that he has quit smoking. His smoking use included Cigarettes. He started smoking about 46 years ago. He has a .4 pack-year smoking history. He has never used smokeless tobacco. He reports that he does not drink alcohol or use illicit drugs.  Allergies: No Known Allergies  Medications Prior to Admission  Medication Sig Dispense Refill  . acetic acid (VOSOL) 2 % otic solution Place 4 drops into both ears 3  (three) times daily. 15 mL 1  . amLODipine (NORVASC) 5 MG tablet Take 1 tablet by mouth daily.  1  . aspirin 325 MG tablet Take 325 mg by mouth daily.    . carvedilol (COREG) 6.25 MG tablet Take 1.5 tablets (9.375 mg total) by mouth 2 (two) times daily with a meal. 270 tablet 3  . cholecalciferol (VITAMIN D) 1000 UNITS tablet Take 1,000 Units by mouth daily.    . cyanocobalamin (,VITAMIN B-12,) 1000 MCG/ML injection Inject one ML IM daily for a week then weekly for 4 weeks then monthly 10 mL 1  . docusate sodium (COLACE) 100 MG capsule Take 2 po qhs (Patient taking differently: As needed) 60 capsule 11  . HYDROcodone-acetaminophen (NORCO/VICODIN) 5-325 MG per tablet Take 1 tablet by mouth every 6 (six) hours as needed. for pain  0  . hydrocortisone (PROCTOSOL HC) 2.5 % rectal cream Place 1 application rectally 2 (two) times daily. (Patient taking differently: Place 1 application rectally as needed. ) 30 g 11  . Iron-Vit C-Vit B12-Folic Acid (IRON 751 PLUS PO) Take 1 tablet by mouth daily.    Marland Kitchen LORazepam (ATIVAN) 1 MG tablet Take 1 tablet (1 mg total) by mouth at bedtime. (Patient taking differently: Take 1 mg by mouth daily as needed for sleep. ) 90 tablet  0  . Multiple Vitamin (MULTIVITAMIN) capsule Take 1 capsule by mouth daily.    Marland Kitchen neomycin-polymyxin-hydrocortisone (CORTISPORIN) otic solution 1-2 drops 2 (two) times daily. To the Right Big Toe.    . polyethylene glycol-electrolytes (NULYTELY/GOLYTELY) 420 G solution Take 4,000 mLs by mouth once. 4000 mL 0  . pravastatin (PRAVACHOL) 40 MG tablet Take 1 tablet by mouth  daily 90 tablet 2  . temazepam (RESTORIL) 30 MG capsule Take 1 capsule by mouth at bedtime as needed for sleep.     Marland Kitchen zolpidem (AMBIEN) 5 MG tablet Take 5 mg by mouth at bedtime as needed for sleep.   0  . allopurinol (ZYLOPRIM) 100 MG tablet Take 1 tablet (100 mg total) by mouth daily. (Patient not taking: Reported on 10/19/2014) 90 tablet 3  . methocarbamol (ROBAXIN) 500 MG tablet  Take 500 mg by mouth daily as needed for muscle spasms.     Marland Kitchen sertraline (ZOLOFT) 50 MG tablet Take 1 tablet (50 mg total) by mouth daily. (Patient not taking: Reported on 10/19/2014) 30 tablet 11    No results found for this or any previous visit (from the past 48 hour(s)). No results found.  ROS  Blood pressure 142/72, pulse 64, temperature 98.2 F (36.8 C), temperature source Oral, resp. rate 10, height 6\' 2"  (1.88 m), weight 244 lb (110.678 kg), SpO2 100 %. Physical Exam  Constitutional: He appears well-developed and well-nourished.  HENT:  Mouth/Throat: Oropharynx is clear and moist.  Eyes: Conjunctivae are normal. No scleral icterus.  Neck: No thyromegaly present.  Cardiovascular: Normal rate, regular rhythm and normal heart sounds.   No murmur heard. Respiratory: Effort normal and breath sounds normal.  GI: Soft. He exhibits no distension and no mass. There is no tenderness.  Musculoskeletal: He exhibits no edema.  Lymphadenopathy:    He has no cervical adenopathy.  Neurological: He is alert.  Skin: Skin is warm and dry.     Assessment/Plan Rectal bleeding. Anemia with low serum ferritin. History of tubular adenoma. Diagnostic colonoscopy.  [name],[name] 11/03/2014, 12:49 PM

## 2014-11-03 NOTE — Op Note (Signed)
COLONOSCOPY PROCEDURE REPORT  PATIENT:  Travis Padilla  MR#:  [id number] Birthdate:  [birth year], 69 y.o., male Endoscopist:  Dr. Rogene Houston, MD Referred By:  Mr. Stevan Born, NP  Procedure Date: 11/03/2014  Procedure:   Colonoscopy  Indications:  Patient is 69 year old Caucasian male who presents with intermittent rectal bleeding who also has anemia and low serum ferritin with normal serum iron and saturation of 21%. Last colonoscopy was in March 2010 with removal of single small polyp and it was tubular adenoma. Family history is negative for CRC.  Informed Consent:  The procedure and risks were reviewed with the patient and informed consent was obtained.  Medications:  Demerol 50 mg IV Versed 9 mg IV  Description of procedure:  After a digital rectal exam was performed, that colonoscope was advanced from the anus through the rectum and colon to the area of the cecum, ileocecal valve and appendiceal orifice. The cecum was deeply intubated. These structures were well-seen and photographed for the record. From the level of the cecum and ileocecal valve, the scope was slowly and cautiously withdrawn. The mucosal surfaces were carefully surveyed utilizing scope tip to flexion to facilitate fold flattening as needed. The scope was pulled down into the rectum where a thorough exam including retroflexion was performed.  Findings:   Prep excellent. Few scattered diverticula at sigmoid colon. Normal rectal mucosa. Small hemorrhoids noted below the dentate line. Soft sentinel skin tags.   Therapeutic/Diagnostic Maneuvers Performed:   None  Complications:  None  Cecal Withdrawal Time:  16 minutes  Impression:  Examination performed to cecum. Mild sigmoid colon diverticulosis. No evidence of recurrent polyps. External hemorrhoids felt to be source of rectal bleeding.  Recommendations:  Standard instructions given. High fiber diet. Benefiber 4 g by mouth daily at bedtime. Will  check H&H and serum ferritin today.   Naysa Puskas U  11/03/2014 1:33 PM  CC: Stevan Born, NP/Dr. Claretta Fraise, MD & Dr. Rayne Du ref. provider found

## 2014-11-03 NOTE — Discharge Instructions (Signed)
Resume usual medications and high fiber diet. Benefiber 4 g by mouth daily at bedtime. No driving for 24 hours. Physician will call with results of blood test.  Colonoscopy, Care After Refer to this sheet in the next few weeks. These instructions provide you with information on caring for yourself after your procedure. Your health care provider may also give you more specific instructions. Your treatment has been planned according to current medical practices, but problems sometimes occur. Call your health care provider if you have any problems or questions after your procedure. WHAT TO EXPECT AFTER THE PROCEDURE  After your procedure, it is typical to have the following:  A small amount of blood in your stool.  Moderate amounts of gas and mild abdominal cramping or bloating. HOME CARE INSTRUCTIONS  Do not drive, operate machinery, or sign important documents for 24 hours.  You may shower and resume your regular physical activities, but move at a slower pace for the first 24 hours.  Take frequent rest periods for the first 24 hours.  Walk around or put a warm pack on your abdomen to help reduce abdominal cramping and bloating.  Drink enough fluids to keep your urine clear or pale yellow.  You may resume your normal diet as instructed by your health care provider. Avoid heavy or fried foods that are hard to digest.  Avoid drinking alcohol for 24 hours or as instructed by your health care provider.  Only take over-the-counter or prescription medicines as directed by your health care provider.  If a tissue sample (biopsy) was taken during your procedure:  Do not take aspirin or blood thinners for 7 days, or as instructed by your health care provider.  Do not drink alcohol for 7 days, or as instructed by your health care provider.  Eat soft foods for the first 24 hours. SEEK MEDICAL CARE IF: You have persistent spotting of blood in your stool 2-3 days after the procedure. SEEK  IMMEDIATE MEDICAL CARE IF:  You have more than a small spotting of blood in your stool.  You pass large blood clots in your stool.  Your abdomen is swollen (distended).  You have nausea or vomiting.  You have a fever.  You have increasing abdominal pain that is not relieved with medicine. Document Released: 04/10/2004 Document Revised: 06/17/2013 Document Reviewed: 05/04/2013 [facility] Patient Information 2015 [place], [place]. This information is not intended to replace advice given to you by your health care provider. Make sure you discuss any questions you have with your health care provider.

## 2014-11-04 ENCOUNTER — Encounter (HOSPITAL_COMMUNITY): Payer: Self-pay | Admitting: Internal Medicine

## 2014-11-04 LAB — FERRITIN: FERRITIN: 33 ng/mL (ref 22–322)

## 2014-11-09 ENCOUNTER — Ambulatory Visit: Payer: Medicare Other | Attending: Orthopedic Surgery | Admitting: *Deleted

## 2014-11-09 ENCOUNTER — Encounter: Payer: Self-pay | Admitting: *Deleted

## 2014-11-09 DIAGNOSIS — M542 Cervicalgia: Secondary | ICD-10-CM | POA: Diagnosis not present

## 2014-11-09 NOTE — Therapy (Signed)
[facility] [place], [place], [address] Phone: [phone]   Fax:  [phone]  Physical Therapy Treatment  Patient Details  Name: Travis Padilla MRN: [id number] Date of Birth: Aug 28, 1946 Referring Provider:  Claretta Fraise, MD  Encounter Date: 11/09/2014      PT End of Session - 11/09/14 0957    Visit Number 8   Number of Visits 8   PT Start Time 0902   PT Stop Time 0945   PT Time Calculation (min) 43 min      Past Medical History  Diagnosis Date  . Hypertension   . Gout   . 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 Hemorrhoid   . Dizziness   . Racing heart beat   . Chest pain     December, 2013  . Abnormal nuclear stress test     December, 2013  . CAD (coronary artery disease)     Mild nonobstructive plaque in cath 2013  . Hyperlipidemia     Past Surgical History  Procedure Laterality Date  . Knee cartilage surgery Left     Left knee  . Cataract extraction w/ intraocular lens  implant, bilateral    . Belpharoptosis repair Bilateral   . Colonoscopy N/A 11/03/2014    Procedure: COLONOSCOPY;  Surgeon: Rogene Houston, MD;  [facility];  Service: Endoscopy;  Laterality: N/A;  1225    There were no vitals taken for this visit.  Visit Diagnosis:  Neck pain      Subjective Assessment - 11/09/14 0911    Symptoms LT shoulder pain is worse 4-5/10. Tried to start exercising again. My neck is better 1/10 pain 70% better   Pain Score 4    Pain Location Shoulder   Pain Orientation Left   Pain Descriptors / Indicators Aching;Constant          [facility] PT Assessment - 11/09/14 0001    ROM / Strength   AROM / PROM / Strength AROM   AROM   Overall AROM  Within functional limits for tasks performed   AROM Assessment Site Cervical   Cervical - Right Rotation 70   Cervical - Left Rotation 70                  [facility] Adult PT Treatment/Exercise - 11/09/14 0001    Ultrasound   Ultrasound Location  LT U-trap and into cerv. paras   Ultrasound Parameters 1.5 w/cm sq. x 10 min   Ultrasound Goals Pain   Traction   Type of Traction Cervical   Min (lbs) 5   Max (lbs) 27   Hold Time 99   Rest Time 5   Time 15                     PT Long Term Goals - 11/02/14 1003    PT LONG TERM GOAL #1   Title demonstrate and/or verbalize techniques to reduce the risk of re-injury to include info TL:[id number]   Time 4   Period Weeks   Status Achieved  10/07/14   PT LONG TERM GOAL #2   Title be independent with advanced HEP   Time 4   Period Weeks   Status Achieved   PT LONG TERM GOAL #3   Title increase ROM with active bilateral cervical rotation to 70 degrees   Time 4   Period Weeks   Status On-going   PT LONG TERM GOAL #  4   Title tolerate standing for 20 minutes with pain not >3/10   Time 4   Period Weeks   Status On-going   PT LONG TERM GOAL #5   Title tolerate sitting for 20 minutes with pain not >3/10   Time 4   Period Weeks   Status Achieved  10/14/14   Additional Long Term Goals   Additional Long Term Goals Yes   PT LONG TERM GOAL #6   Title sleep undisturbed 6 hours   Time 4   Period Weeks   Status Achieved   PT LONG TERM GOAL #7   Title perform ADL's with pain not > 3/10   Time 4   Period Weeks   Status On-going    LTG # 3  MET on 11-09-14             Plan - 11/09/14 0959    Clinical Impression Statement Pt. has progressed with decreased pain in his neck 1/10, but his LT shldr has increased pain in the last 2 weeks 4-5/10 with constant ache   PT Next Visit Plan pt stated he would probably F/U with his MD to Assess his LT shldr. ON Hold untill MD F/U        Problem List Patient Active Problem List   Diagnosis Date Noted  . Chest pain   . Abnormal nuclear stress test   . OBESITY, UNSPECIFIED 03/28/2009  . ESSENTIAL HYPERTENSION, BENIGN 03/28/2009  . PALPITATIONS 03/28/2009  . SNORING 03/28/2009    Rylei Codispoti,CHRIS PTA 11/09/2014, 10:16  AM  [facility] [address] [place], [place], [address] Phone: [phone]   Fax:  [phone]

## 2014-11-18 ENCOUNTER — Other Ambulatory Visit (HOSPITAL_COMMUNITY): Payer: Self-pay | Admitting: Orthopedic Surgery

## 2014-11-18 ENCOUNTER — Ambulatory Visit (HOSPITAL_COMMUNITY)
Admission: RE | Admit: 2014-11-18 | Discharge: 2014-11-18 | Disposition: A | Discharge status: Home / Self Care | Payer: Medicare Other | Source: Ambulatory Visit | Attending: Orthopedic Surgery | Admitting: Orthopedic Surgery

## 2014-11-18 DIAGNOSIS — M5412 Radiculopathy, cervical region: Secondary | ICD-10-CM | POA: Diagnosis not present

## 2014-11-18 DIAGNOSIS — X58XXXA Exposure to other specified factors, initial encounter: Secondary | ICD-10-CM | POA: Insufficient documentation

## 2014-11-18 DIAGNOSIS — T1590XA Foreign body on external eye, part unspecified, unspecified eye, initial encounter: Secondary | ICD-10-CM | POA: Diagnosis not present

## 2014-11-18 DIAGNOSIS — M25512 Pain in left shoulder: Secondary | ICD-10-CM

## 2014-11-18 DIAGNOSIS — Z01818 Encounter for other preprocedural examination: Secondary | ICD-10-CM | POA: Diagnosis not present

## 2014-11-22 DIAGNOSIS — C44119 Basal cell carcinoma of skin of left eyelid, including canthus: Secondary | ICD-10-CM | POA: Diagnosis not present

## 2014-11-22 DIAGNOSIS — C44112 Basal cell carcinoma of skin of right eyelid, including canthus: Secondary | ICD-10-CM | POA: Diagnosis not present

## 2014-11-24 ENCOUNTER — Ambulatory Visit (INDEPENDENT_AMBULATORY_CARE_PROVIDER_SITE_OTHER): Payer: Medicare Other | Admitting: *Deleted

## 2014-11-24 DIAGNOSIS — H26492 Other secondary cataract, left eye: Secondary | ICD-10-CM | POA: Diagnosis not present

## 2014-11-24 DIAGNOSIS — H52223 Regular astigmatism, bilateral: Secondary | ICD-10-CM | POA: Diagnosis not present

## 2014-11-24 DIAGNOSIS — E538 Deficiency of other specified B group vitamins: Secondary | ICD-10-CM

## 2014-11-24 MED ORDER — CYANOCOBALAMIN 1000 MCG/ML IJ SOLN
1000.0000 ug | INTRAMUSCULAR | Status: DC
  Administered 2014-11-24 – 2014-12-29 (×2): 1000 ug via INTRAMUSCULAR

## 2014-11-24 NOTE — Patient Instructions (Signed)

## 2014-11-24 NOTE — Progress Notes (Signed)
Pt given B12 injection IM left deltoid, pt tolerated injection well.

## 2014-11-27 DIAGNOSIS — M5412 Radiculopathy, cervical region: Secondary | ICD-10-CM | POA: Diagnosis not present

## 2014-12-09 ENCOUNTER — Ambulatory Visit: Payer: Medicare Other | Admitting: Family Medicine

## 2014-12-13 ENCOUNTER — Ambulatory Visit: Payer: Medicare Other | Admitting: Family Medicine

## 2014-12-27 DIAGNOSIS — Z9889 Other specified postprocedural states: Secondary | ICD-10-CM | POA: Diagnosis not present

## 2014-12-27 DIAGNOSIS — S01101A Unspecified open wound of right eyelid and periocular area, initial encounter: Secondary | ICD-10-CM | POA: Diagnosis not present

## 2014-12-27 DIAGNOSIS — Z08 Encounter for follow-up examination after completed treatment for malignant neoplasm: Secondary | ICD-10-CM | POA: Diagnosis not present

## 2014-12-27 DIAGNOSIS — C4499 Other specified malignant neoplasm of skin, unspecified: Secondary | ICD-10-CM | POA: Diagnosis not present

## 2014-12-27 DIAGNOSIS — C44319 Basal cell carcinoma of skin of other parts of face: Secondary | ICD-10-CM | POA: Diagnosis not present

## 2014-12-27 DIAGNOSIS — S01102A Unspecified open wound of left eyelid and periocular area, initial encounter: Secondary | ICD-10-CM | POA: Diagnosis not present

## 2014-12-27 DIAGNOSIS — C44112 Basal cell carcinoma of skin of right eyelid, including canthus: Secondary | ICD-10-CM | POA: Diagnosis not present

## 2014-12-27 DIAGNOSIS — Z85828 Personal history of other malignant neoplasm of skin: Secondary | ICD-10-CM | POA: Diagnosis not present

## 2014-12-29 ENCOUNTER — Encounter: Payer: Self-pay | Admitting: Family Medicine

## 2014-12-29 ENCOUNTER — Ambulatory Visit: Payer: Medicare Other | Admitting: *Deleted

## 2014-12-29 ENCOUNTER — Ambulatory Visit (INDEPENDENT_AMBULATORY_CARE_PROVIDER_SITE_OTHER): Payer: Medicare Other | Admitting: Family Medicine

## 2014-12-29 VITALS — BP 113/71 | HR 73 | Temp 97.2°F | Ht 74.0 in | Wt 241.0 lb

## 2014-12-29 DIAGNOSIS — I10 Essential (primary) hypertension: Secondary | ICD-10-CM

## 2014-12-29 DIAGNOSIS — I951 Orthostatic hypotension: Secondary | ICD-10-CM | POA: Diagnosis not present

## 2014-12-29 DIAGNOSIS — H65192 Other acute nonsuppurative otitis media, left ear: Secondary | ICD-10-CM

## 2014-12-29 DIAGNOSIS — E538 Deficiency of other specified B group vitamins: Secondary | ICD-10-CM

## 2014-12-29 DIAGNOSIS — D649 Anemia, unspecified: Secondary | ICD-10-CM

## 2014-12-29 LAB — POCT CBC
Granulocyte percent: 56.9 %G (ref 37–80)
HCT, POC: 40.9 % — AB (ref 43.5–53.7)
Hemoglobin: 12.9 g/dL — AB (ref 14.1–18.1)
LYMPH, POC: 2.3 (ref 0.6–3.4)
MCH, POC: 27.7 pg (ref 27–31.2)
MCHC: 31.6 g/dL — AB (ref 31.8–35.4)
MCV: 87.6 fL (ref 80–97)
MPV: 8.2 fL (ref 0–99.8)
POC Granulocyte: 3.9 (ref 2–6.9)
POC LYMPH PERCENT: 32.8 %L (ref 10–50)
Platelet Count, POC: 231 10*3/uL (ref 142–424)
RBC: 4.67 M/uL — AB (ref 4.69–6.13)
RDW, POC: 14.3 %
WBC: 6.9 10*3/uL (ref 4.6–10.2)

## 2014-12-29 MED ORDER — AMOXICILLIN-POT CLAVULANATE 875-125 MG PO TABS: 1.0000 | ORAL_TABLET | Freq: Two times a day (BID) | ORAL | Status: DC

## 2014-12-29 NOTE — Progress Notes (Signed)
Subjective:    Patient ID: Travis Padilla, male    DOB: 09-Dec-1945, 69 y.o.   MRN: [id number]  HPI  Chief Complaint  Patient presents with  . Anemia    4 month f/u  . Ear Pain    Throbbing left ear pain that began last night. He has started using acetic acid ear drops.   . Hypertension   follow-up of hypertension. Patient has no history of headache chest pain or shortness of breath or recent cough. Patient also denies symptoms of TIA such as numbness weakness lateralizing. Patient checks  blood pressure at home and has not had any elevated readings recently. Patient denies side effects from his medication. States taking it regularly.  Patient states that when he first gets up out of a chair he feels lightheaded like he might pass out or fall. This lasts just a moment or so. By standing still it will go away quickly. Occasionally it will occur just when he's walking around the house at home. It is not associated with any loss of consciousness. No known trauma. Onset several months ago. He has recently been given an increased dose of carvediloll by his cardiologist  Patient is taking B12 shots due to diagnosis of B12 deficiency anemia. He was diagnosed with this after attempting to donate blood and being found to be anemic. Colonoscopy was performed showing no signs of intestinal bleeding. He has been taking B12 shots every few weeks and had one earlier this morning before his visit. He denies any sluggishness that goes with anemia.  He is having some tiredness congestion and runny nose that goes with allergies. He is not medicating for that because he's been told that pseudoephedrine would make him too jacked up.history pain started acutely last night but he's had chronic recurrent ear infections that have responded in the past 2 he acetic acid drops. Patient Active Problem List   Diagnosis Date Noted  . Anemia   . Chest pain   . Abnormal nuclear stress test   . OBESITY, UNSPECIFIED  03/28/2009  . ESSENTIAL HYPERTENSION, BENIGN 03/28/2009  . PALPITATIONS 03/28/2009  . SNORING 03/28/2009   Outpatient Encounter Prescriptions as of 12/29/2014  Medication Sig  . amLODipine (NORVASC) 5 MG tablet Take 1 tablet by mouth daily.  Marland Kitchen aspirin 325 MG tablet Take 325 mg by mouth daily.  . carvedilol (COREG) 6.25 MG tablet Take 1.5 tablets (9.375 mg total) by mouth 2 (two) times daily with a meal.  . cholecalciferol (VITAMIN D) 1000 UNITS tablet Take 1,000 Units by mouth daily.  . cyanocobalamin (,VITAMIN B-12,) 1000 MCG/ML injection Inject one ML IM daily for a week then weekly for 4 weeks then monthly  . docusate sodium (COLACE) 100 MG capsule Take 2 po qhs (Patient taking differently: As needed)  . Iron-Vit C-Vit B12-Folic Acid (IRON 376 PLUS PO) Take 1 tablet by mouth daily.  Marland Kitchen LORazepam (ATIVAN) 1 MG tablet Take 1 tablet (1 mg total) by mouth at bedtime. (Patient taking differently: Take 1 mg by mouth daily as needed for sleep. )  . methocarbamol (ROBAXIN) 500 MG tablet Take 500 mg by mouth daily as needed for muscle spasms.   . pravastatin (PRAVACHOL) 40 MG tablet Take 1 tablet by mouth  daily  . temazepam (RESTORIL) 30 MG capsule Take 1 capsule by mouth at bedtime as needed for sleep.   Marland Kitchen zolpidem (AMBIEN) 5 MG tablet Take 5 mg by mouth at bedtime as needed for sleep.   . [  DISCONTINUED] Wheat Dextrin (BENEFIBER DRINK MIX) PACK Take 4 g by mouth at bedtime.  Marland Kitchen acetic acid (VOSOL) 2 % otic solution Place 4 drops into both ears 3 (three) times daily. (Patient not taking: Reported on 12/29/2014)  . allopurinol (ZYLOPRIM) 100 MG tablet Take 1 tablet (100 mg total) by mouth daily. (Patient not taking: Reported on 10/19/2014)  . HYDROcodone-acetaminophen (NORCO/VICODIN) 5-325 MG per tablet Take 1 tablet by mouth every 6 (six) hours as needed. for pain  . sertraline (ZOLOFT) 50 MG tablet Take 1 tablet (50 mg total) by mouth daily. (Patient not taking: Reported on 12/29/2014)  . [DISCONTINUED]  hydrocortisone (PROCTOSOL HC) 2.5 % rectal cream Place 1 application rectally 2 (two) times daily. (Patient taking differently: Place 1 application rectally as needed. )  . [DISCONTINUED] Multiple Vitamin (MULTIVITAMIN) capsule Take 1 capsule by mouth daily.  . [DISCONTINUED] neomycin-polymyxin-hydrocortisone (CORTISPORIN) otic solution 1-2 drops 2 (two) times daily. To the Right Big Toe.  . [DISCONTINUED] cyanocobalamin ((VITAMIN B-12)) injection 1,000 mcg      Review of Systems  Constitutional: Negative for fever, chills, diaphoresis and unexpected weight change.  HENT: Positive for congestion, ear pain and rhinorrhea. Negative for hearing loss, sore throat and trouble swallowing.   Respiratory: Negative for cough, chest tightness, shortness of breath and wheezing.   Gastrointestinal: Negative for nausea, vomiting, abdominal pain, diarrhea, constipation and abdominal distention.  Endocrine: Negative for cold intolerance and heat intolerance.  Genitourinary: Negative for dysuria, hematuria and flank pain.  Musculoskeletal: Negative for joint swelling and arthralgias.  Skin: Negative for rash.  Neurological: Negative for dizziness and headaches.  Psychiatric/Behavioral: Negative for dysphoric mood, decreased concentration and agitation. The patient is not nervous/anxious.        Objective:   Physical Exam  Constitutional: He is oriented to person, place, and time. He appears well-developed and well-nourished. No distress.  HENT:  Head: Normocephalic and atraumatic.  Right Ear: External ear normal. A middle ear effusion is present.  Left Ear: External ear normal. A middle ear effusion is present.  Nose: Nose normal.  Mouth/Throat: Oropharynx is clear and moist.  Eyes: Conjunctivae and EOM are normal. Pupils are equal, round, and reactive to light.  Neck: Normal range of motion. Neck supple. No thyromegaly present.  Cardiovascular: Normal rate, regular rhythm and normal heart sounds.     No murmur heard. Pulmonary/Chest: Effort normal and breath sounds normal. No respiratory distress. He has no wheezes. He has no rales.  Abdominal: Soft. Bowel sounds are normal. He exhibits no distension. There is no tenderness.  Lymphadenopathy:    He has no cervical adenopathy.  Neurological: He is alert and oriented to person, place, and time. He has normal reflexes.  Skin: Skin is warm and dry.  Psychiatric: He has a normal mood and affect. His behavior is normal. Judgment and thought content normal.    BP 113/71 mmHg  Pulse 73  Temp(Src) 97.2 F (36.2 C) (Oral)  Ht '6\' 2"'  (1.88 m)  Wt 241 lb (109.317 kg)  BMI 30.93 kg/m2        Assessment & Plan:   1. Anemia, unspecified anemia type   2. Essential hypertension, benign   3. B12 deficiency   4. Acute nonsuppurative otitis media of left ear   5. Orthostasis     Meds ordered this encounter  Medications  . amoxicillin-clavulanate (AUGMENTIN) 875-125 MG per tablet    Sig: Take 1 tablet by mouth 2 (two) times daily.    Dispense:  20 tablet  Refill:  0    Orders Placed This Encounter  Procedures  . CMP14+EGFR  . Vitamin B12    Standing Status: Future     Number of Occurrences:      Standing Expiration Date: 12/29/2015  . POCT CBC    Labs pending Health Maintenance reviewed Diet and exercise encouraged increase water to 64 ounces minimum daily  Continue all meds as discussed. If increasing waterand treating the middle ear effusion doesn't get rid of the orthostasis he should discuss the dizziness with Dr. Susy Manor who is currently managing his hypertensive medications Follow up in 6Months or sooner if symptoms and the ear do not resolve promptly.  Claretta Fraise, MD

## 2014-12-29 NOTE — Patient Instructions (Addendum)
Drink at least 64 oz water daily. Does not include coffee, tea, soda, etc.  Continue BP meds as is pending appointment with Dr. Latanya Maudlin all of the antibiotic  Add allegra 180 mg daily

## 2014-12-29 NOTE — Progress Notes (Signed)
Vitamin b12 given and tolerated well. 

## 2014-12-29 NOTE — Patient Instructions (Signed)

## 2014-12-30 ENCOUNTER — Encounter: Payer: Self-pay | Admitting: Family Medicine

## 2014-12-30 ENCOUNTER — Other Ambulatory Visit: Payer: Self-pay | Admitting: Cardiology

## 2014-12-30 LAB — CMP14+EGFR
A/G RATIO: 1.4 (ref 1.1–2.5)
ALT: 22 IU/L (ref 0–44)
AST: 18 IU/L (ref 0–40)
Albumin: 4.2 g/dL (ref 3.6–4.8)
Alkaline Phosphatase: 62 IU/L (ref 39–117)
BILIRUBIN TOTAL: 0.6 mg/dL (ref 0.0–1.2)
BUN/Creatinine Ratio: 15 (ref 10–22)
BUN: 15 mg/dL (ref 8–27)
CALCIUM: 9.4 mg/dL (ref 8.6–10.2)
CO2: 21 mmol/L (ref 18–29)
Chloride: 104 mmol/L (ref 97–108)
Creatinine, Ser: 0.97 mg/dL (ref 0.76–1.27)
GFR, EST AFRICAN AMERICAN: 93 mL/min/{1.73_m2} (ref >59)
GFR, EST NON AFRICAN AMERICAN: 80 mL/min/{1.73_m2} (ref >59)
GLOBULIN, TOTAL: 2.9 g/dL (ref 1.5–4.5)
GLUCOSE: 99 mg/dL (ref 65–99)
Potassium: 4.4 mmol/L (ref 3.5–5.2)
Sodium: 142 mmol/L (ref 134–144)
TOTAL PROTEIN: 7.1 g/dL (ref 6.0–8.5)

## 2015-01-05 DIAGNOSIS — M25512 Pain in left shoulder: Secondary | ICD-10-CM | POA: Diagnosis not present

## 2015-01-05 DIAGNOSIS — M5412 Radiculopathy, cervical region: Secondary | ICD-10-CM | POA: Diagnosis not present

## 2015-01-19 ENCOUNTER — Other Ambulatory Visit (INDEPENDENT_AMBULATORY_CARE_PROVIDER_SITE_OTHER): Payer: Medicare Other

## 2015-01-19 DIAGNOSIS — E538 Deficiency of other specified B group vitamins: Secondary | ICD-10-CM

## 2015-01-19 NOTE — Progress Notes (Signed)
Lab only 

## 2015-01-19 NOTE — Addendum Note (Signed)
Addended by: Selmer Dominion on: 01/19/2015 09:06 AM   Modules accepted: Orders

## 2015-01-20 LAB — VITAMIN B12: Vitamin B-12: 330 pg/mL (ref 211–946)

## 2015-02-02 ENCOUNTER — Ambulatory Visit (INDEPENDENT_AMBULATORY_CARE_PROVIDER_SITE_OTHER): Payer: Medicare Other | Admitting: Cardiology

## 2015-02-02 ENCOUNTER — Encounter: Payer: Self-pay | Admitting: Cardiology

## 2015-02-02 VITALS — BP 118/78 | HR 65 | Ht 75.0 in | Wt 247.0 lb

## 2015-02-02 DIAGNOSIS — I420 Dilated cardiomyopathy: Secondary | ICD-10-CM

## 2015-02-02 DIAGNOSIS — I10 Essential (primary) hypertension: Secondary | ICD-10-CM

## 2015-02-02 DIAGNOSIS — R072 Precordial pain: Secondary | ICD-10-CM | POA: Diagnosis not present

## 2015-02-02 MED ORDER — AMLODIPINE BESYLATE 5 MG PO TABS: 5.0000 mg | ORAL_TABLET | Freq: Every day | ORAL | Status: DC

## 2015-02-02 MED ORDER — PRAVASTATIN SODIUM 40 MG PO TABS: ORAL_TABLET | ORAL | Status: DC

## 2015-02-02 MED ORDER — ASPIRIN 81 MG PO TABS: 81.0000 mg | ORAL_TABLET | ORAL | Status: DC

## 2015-02-02 MED ORDER — CARVEDILOL 6.25 MG PO TABS: ORAL_TABLET | ORAL | Status: DC

## 2015-02-02 NOTE — Patient Instructions (Addendum)
Medication Instructions:  Please decrease your ASA to 81 mg a day.  Continue all other medications as listed.  Testing/Procedures: Your physician has requested that you have an echocardiogram. Echocardiography is a painless test that uses sound waves to create images of your heart. It provides your doctor with information about the size and shape of your heart and how well your heart's chambers and valves are working. This procedure takes approximately one hour. There are no restrictions for this procedure.  Follow-Up: Follow up in 1 year with Dr. [name] in [place].  You will receive a letter in the mail 2 months before you are due.  Please call us when you receive this letter to schedule your follow up appointment.  Thank you for choosing [facility]!!

## 2015-02-02 NOTE — Progress Notes (Signed)
HPI The patient presents for followup of chest pain. He was evaluated in December of 2013 with a stress perfusion study which suggested LAD ischemia. However, followup cardiac catheterization demonstrated only mild nonobstructive plaque.  He has had a mildly reduced ejection fraction both on stress testing and cath.   Since I last saw him he has had no new cardiovascular complaints. He will occasionally have some palpitations and occasionally when he exercises he says that his heart rate goes high but is not very long lived. Even reports heart rates as high as 240 though he's had no telemetry to demonstrate this. He does not get any chest pressure, neck or arm discomfort. He does not have any syncope. He has no new shortness of breath, PND or orthopnea. However, he does have mild orthostatic symptoms at times..    No Known Allergies  Current Outpatient Prescriptions  Medication Sig Dispense Refill  . amLODipine (NORVASC) 5 MG tablet Take 1 tablet by mouth daily.  1  . aspirin 325 MG tablet Take 325 mg by mouth once a week.     . carvedilol (COREG) 6.25 MG tablet Take 1 and 1/2 tablets by  mouth two times daily with  a meal 270 tablet 0  . cholecalciferol (VITAMIN D) 1000 UNITS tablet Take 1,000 Units by mouth daily.    . cyanocobalamin (,VITAMIN B-12,) 1000 MCG/ML injection Inject one ML IM daily for a week then weekly for 4 weeks then monthly 10 mL 1  . HYDROcodone-acetaminophen (NORCO/VICODIN) 5-325 MG per tablet Take 1 tablet by mouth every 6 (six) hours as needed. for pain  0  . Iron-Vit C-Vit B12-Folic Acid (IRON 010 PLUS PO) Take 1 tablet by mouth daily.    Marland Kitchen LORazepam (ATIVAN) 1 MG tablet Take 1 tablet (1 mg total) by mouth at bedtime. (Patient taking differently: Take 1 mg by mouth daily as needed for sleep. ) 90 tablet 0  . meloxicam (MOBIC) 15 MG tablet Take 15 mg by mouth daily.    . pravastatin (PRAVACHOL) 40 MG tablet Take 1 tablet by mouth  daily 90 tablet 2  . temazepam (RESTORIL)  30 MG capsule Take 1 capsule by mouth at bedtime as needed for sleep.     Marland Kitchen acetic acid (VOSOL) 2 % otic solution Place 4 drops into both ears 3 (three) times daily. (Patient not taking: Reported on 12/29/2014) 15 mL 1  . allopurinol (ZYLOPRIM) 100 MG tablet Take 1 tablet (100 mg total) by mouth daily. (Patient not taking: Reported on 10/19/2014) 90 tablet 3  . sertraline (ZOLOFT) 50 MG tablet Take 1 tablet (50 mg total) by mouth daily. (Patient not taking: Reported on 12/29/2014) 30 tablet 11  . zolpidem (AMBIEN) 5 MG tablet Take 5 mg by mouth at bedtime as needed for sleep.   0   No current facility-administered medications for this visit.    Past Medical History  Diagnosis Date  . Hypertension   . Gout   . 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 Hemorrhoid   . Dizziness   . Racing heart beat   . Chest pain     December, 2013  . Abnormal nuclear stress test     December, 2013  . CAD (coronary artery disease)     Mild nonobstructive plaque in cath 2013  . Hyperlipidemia   . Skin cancer   . Anemia     Past Surgical History  Procedure Laterality Date  . Knee cartilage surgery  Left     Left knee  . Cataract extraction w/ intraocular lens  implant, bilateral    . Belpharoptosis repair Bilateral   . Colonoscopy N/A 11/03/2014    Procedure: COLONOSCOPY;  Surgeon: Rogene Houston, MD;  Location[facility];  Service: Endoscopy;  Laterality: N/A;  1225  . Skin cancer excision  12/2014    ROS:  Neck and arm pain secondary to spinal stenosis.  Otherwise as stated in the HPI and negative for all other systems  PHYSICAL EXAM BP 118/78 mmHg  Pulse 65  Ht '6\' 3"'$  (1.905 m)  Wt 247 lb (112.038 kg)  BMI 30.87 kg/m2 GENERAL:  Well appearing NECK:  No jugular venous distention, waveform within normal limits, carotid upstroke brisk and symmetric, no bruits, no thyromegaly LUNGS:  Clear to auscultation bilaterally CHEST:  Unremarkable HEART:  PMI not displaced or sustained,S1 and S2 within normal  limits, no S3, no S4, no clicks, no rubs, no murmurs ABD:  Flat, positive bowel sounds normal in frequency in pitch, no bruits, no rebound, no guarding, no midline pulsatile mass, no hepatomegaly, no splenomegaly EXT:  2 plus pulses throughout, no edema, no cyanosis no clubbing, right radial access site without erythema or bruising.    EKG:  Sinus rhythm, rate 65, axis within normal limits, intervals within normal limits, no acute ST-T wave changes.  02/02/2015  ASSESSMENT AND PLAN  CAD -  He has nonobstructive disease. He will continue with risk reduction.  Of note I did suggest that he start taking aspirin 81 mg daily.  ESSENTIAL HYPERTENSION, BENIGN -  This is being managed in the context of treating his CHF.  We did talk at length about orthostatic symptoms and he would let me know if these worsen at which point I might need to back off on medications.  PALPITATIONS -   He has had no new problems related to this.  No further evaluation is necessary for this.  However, he will let me know if these get worse.  CARDIOMYOPATHY Given the fact that he had some symptoms with increased Coreg in the past and ongoing orthostatic symptoms I will not make further changes to this medication.  I would like to repeat an echocardiogram.    DYSLIPIDEMIA -  He is doing well on his pravastatin and his LDL was 76 when last checked. He will remain on the meds as listed.

## 2015-02-08 ENCOUNTER — Ambulatory Visit (INDEPENDENT_AMBULATORY_CARE_PROVIDER_SITE_OTHER): Payer: Medicare Other | Admitting: *Deleted

## 2015-02-08 DIAGNOSIS — E538 Deficiency of other specified B group vitamins: Secondary | ICD-10-CM

## 2015-02-08 MED ORDER — CYANOCOBALAMIN 1000 MCG/ML IJ SOLN
1000.0000 ug | INTRAMUSCULAR | Status: DC
  Administered 2015-02-08 – 2015-03-11 (×2): 1000 ug via INTRAMUSCULAR

## 2015-02-08 NOTE — Patient Instructions (Signed)

## 2015-02-09 DIAGNOSIS — M5082 Other cervical disc disorders, mid-cervical region: Secondary | ICD-10-CM | POA: Diagnosis not present

## 2015-02-09 DIAGNOSIS — M5412 Radiculopathy, cervical region: Secondary | ICD-10-CM | POA: Diagnosis not present

## 2015-02-21 ENCOUNTER — Other Ambulatory Visit: Payer: Self-pay

## 2015-02-21 ENCOUNTER — Ambulatory Visit (HOSPITAL_COMMUNITY): Payer: Medicare Other | Attending: Cardiology

## 2015-02-21 DIAGNOSIS — I340 Nonrheumatic mitral (valve) insufficiency: Secondary | ICD-10-CM | POA: Diagnosis not present

## 2015-02-21 DIAGNOSIS — I420 Dilated cardiomyopathy: Secondary | ICD-10-CM | POA: Diagnosis not present

## 2015-02-21 DIAGNOSIS — I10 Essential (primary) hypertension: Secondary | ICD-10-CM | POA: Diagnosis not present

## 2015-02-21 DIAGNOSIS — I429 Cardiomyopathy, unspecified: Secondary | ICD-10-CM | POA: Diagnosis present

## 2015-02-23 ENCOUNTER — Encounter: Payer: Self-pay | Admitting: Physician Assistant

## 2015-02-23 ENCOUNTER — Ambulatory Visit (INDEPENDENT_AMBULATORY_CARE_PROVIDER_SITE_OTHER): Payer: Medicare Other | Admitting: Physician Assistant

## 2015-02-23 VITALS — BP 132/77 | HR 71 | Temp 99.0°F | Ht 75.0 in | Wt 240.2 lb

## 2015-02-23 DIAGNOSIS — T23202A Burn of second degree of left hand, unspecified site, initial encounter: Secondary | ICD-10-CM | POA: Diagnosis not present

## 2015-02-23 DIAGNOSIS — M10042 Idiopathic gout, left hand: Secondary | ICD-10-CM

## 2015-02-23 DIAGNOSIS — M109 Gout, unspecified: Secondary | ICD-10-CM

## 2015-02-23 MED ORDER — CEPHALEXIN 500 MG PO CAPS: 500.0000 mg | ORAL_CAPSULE | Freq: Three times a day (TID) | ORAL | Status: DC

## 2015-02-23 MED ORDER — INDOMETHACIN 50 MG PO CAPS: 50.0000 mg | ORAL_CAPSULE | Freq: Three times a day (TID) | ORAL | Status: DC | PRN

## 2015-02-23 NOTE — Patient Instructions (Signed)
Burn Care Your skin is a natural barrier to infection. It is the largest organ of your body. Burns damage this natural protection. To help prevent infection, it is very important to follow your caregiver's instructions in the care of your burn. Burns are classified as:  First degree. There is only redness of the skin (erythema). No scarring is expected.  Second degree. There is blistering of the skin. Scarring may occur with deeper burns.  Third degree. All layers of the skin are injured, and scarring is expected. HOME CARE INSTRUCTIONS   Wash your hands well before changing your bandage.  Change your bandage as often as directed by your caregiver.  Remove the old bandage. If the bandage sticks, you may soak it off with cool, clean water.  Cleanse the burn thoroughly but gently with mild soap and water.  Pat the area dry with a clean, dry cloth.  Apply a thin layer of antibacterial cream to the burn.  Apply a clean bandage as instructed by your caregiver.  Keep the bandage as clean and dry as possible.  Elevate the affected area for the first 24 hours, then as instructed by your caregiver.  Only take over-the-counter or prescription medicines for pain, discomfort, or fever as directed by your caregiver. SEEK IMMEDIATE MEDICAL CARE IF:   You develop excessive pain.  You develop redness, tenderness, swelling, or red streaks near the burn.  The burned area develops yellowish-white fluid (pus) or a bad smell.  You have a fever. MAKE SURE YOU:   Understand these instructions.  Will watch your condition.  Will get help right away if you are not doing well or get worse. Document Released: 08/27/2005 Document Revised: 11/19/2011 Document Reviewed: 01/17/2011 [facility] Patient Information 2015 [place], [place]. This information is not intended to replace advice given to you by your health care provider. Make sure you discuss any questions you have with your health care  provider. Gout Gout is an inflammatory arthritis caused by a buildup of uric acid crystals in the joints. Uric acid is a chemical that is normally present in the blood. When the level of uric acid in the blood is too high it can form crystals that deposit in your joints and tissues. This causes joint redness, soreness, and swelling (inflammation). Repeat attacks are common. Over time, uric acid crystals can form into masses (tophi) near a joint, destroying bone and causing disfigurement. Gout is treatable and often preventable. CAUSES  The disease begins with elevated levels of uric acid in the blood. Uric acid is produced by your body when it breaks down a naturally found substance called purines. Certain foods you eat, such as meats and fish, contain high amounts of purines. Causes of an elevated uric acid level include:  Being passed down from parent to child (heredity).  Diseases that cause increased uric acid production (such as obesity, psoriasis, and certain cancers).  Excessive alcohol use.  Diet, especially diets rich in meat and seafood.  Medicines, including certain cancer-fighting medicines (chemotherapy), water pills (diuretics), and aspirin.  Chronic kidney disease. The kidneys are no longer able to remove uric acid well.  Problems with metabolism. Conditions strongly associated with gout include:  Obesity.  High blood pressure.  High cholesterol.  Diabetes. Not everyone with elevated uric acid levels gets gout. It is not understood why some people get gout and others do not. Surgery, joint injury, and eating too much of certain foods are some of the factors that can lead to gout attacks.  SYMPTOMS   An attack of gout comes on quickly. It causes intense pain with redness, swelling, and warmth in a joint.  Fever can occur.  Often, only one joint is involved. Certain joints are more commonly involved:  Base of the big  toe.  Knee.  Ankle.  Wrist.  Finger. Without treatment, an attack usually goes away in a few days to weeks. Between attacks, you usually will not have symptoms, which is different from many other forms of arthritis. DIAGNOSIS  Your caregiver will suspect gout based on your symptoms and exam. In some cases, tests may be recommended. The tests may include:  Blood tests.  Urine tests.  X-rays.  Joint fluid exam. This exam requires a needle to remove fluid from the joint (arthrocentesis). Using a microscope, gout is confirmed when uric acid crystals are seen in the joint fluid. TREATMENT  There are two phases to gout treatment: treating the sudden onset (acute) attack and preventing attacks (prophylaxis).  Treatment of an Acute Attack.  Medicines are used. These include anti-inflammatory medicines or steroid medicines.  An injection of steroid medicine into the affected joint is sometimes necessary.  The painful joint is rested. Movement can worsen the arthritis.  You may use warm or cold treatments on painful joints, depending which works best for you.  Treatment to Prevent Attacks.  If you suffer from frequent gout attacks, your caregiver may advise preventive medicine. These medicines are started after the acute attack subsides. These medicines either help your kidneys eliminate uric acid from your body or decrease your uric acid production. You may need to stay on these medicines for a very long time.  The early phase of treatment with preventive medicine can be associated with an increase in acute gout attacks. For this reason, during the first few months of treatment, your caregiver may also advise you to take medicines usually used for acute gout treatment. Be sure you understand your caregiver's directions. Your caregiver may make several adjustments to your medicine dose before these medicines are effective.  Discuss dietary treatment with your caregiver or dietitian.  Alcohol and drinks high in sugar and fructose and foods such as meat, poultry, and seafood can increase uric acid levels. Your caregiver or dietitian can advise you on drinks and foods that should be limited. HOME CARE INSTRUCTIONS   Do not take aspirin to relieve pain. This raises uric acid levels.  Only take over-the-counter or prescription medicines for pain, discomfort, or fever as directed by your caregiver.  Rest the joint as much as possible. When in bed, keep sheets and blankets off painful areas.  Keep the affected joint raised (elevated).  Apply warm or cold treatments to painful joints. Use of warm or cold treatments depends on which works best for you.  Use crutches if the painful joint is in your leg.  Drink enough fluids to keep your urine clear or pale yellow. This helps your body get rid of uric acid. Limit alcohol, sugary drinks, and fructose drinks.  Follow your dietary instructions. Pay careful attention to the amount of protein you eat. Your daily diet should emphasize fruits, vegetables, whole grains, and fat-free or low-fat milk products. Discuss the use of coffee, vitamin C, and cherries with your caregiver or dietitian. These may be helpful in lowering uric acid levels.  Maintain a healthy body weight. SEEK MEDICAL CARE IF:   You develop diarrhea, vomiting, or any side effects from medicines.  You do not feel better in 24 hours,  or you are getting worse. SEEK IMMEDIATE MEDICAL CARE IF:   Your joint becomes suddenly more tender, and you have chills or a fever. MAKE SURE YOU:   Understand these instructions.  Will watch your condition.  Will get help right away if you are not doing well or get worse. Document Released: 08/24/2000 Document Revised: 01/11/2014 Document Reviewed: 04/09/2012 [facility] Patient Information 2015 [place], [place]. This information is not intended to replace advice given to you by your health care provider. Make sure you discuss any  questions you have with your health care provider.

## 2015-02-23 NOTE — Progress Notes (Signed)
Subjective:     Patient ID: Travis Padilla, male   DOB: Jan 19, 1946, 69 y.o.   MRN: [id number]  HPI Pt here with a burn to the L hand He was working with a piece of outdoor equipment when he grabbed the muffler Noticed pain and blistering to the L palm Now he has had some pain to the L index finger and questions possible gout flare   Review of Systems No drainage from the wound Sl swelling around wound and to the knuckle of the index finger No fever chills    Objective:   Physical Exam Resolving 2nd degree burn to the entire thenar area of the L palm No surrounding edema/induration No drainage Good ROM of the thumb Good strength and sensory to the thumb Pt with erythema and edema to the MCP joint of the index finger Good ROM but causes sx No burn to the index finger seen    Assessment:     2nd degree burn to the L palm L 2nd digit finger pain- ? gout    Plan:     Wound care and nl healing reviewed with pt Given erythema will cover with Keflex Tetanus was updated today Suspect gout flare to the index finger Indocin rx Pt aware to hold Mobic while on Indocin Work on ROM of all finger until fully healed F/U prn

## 2015-03-07 ENCOUNTER — Other Ambulatory Visit: Payer: Self-pay

## 2015-03-07 DIAGNOSIS — D225 Melanocytic nevi of trunk: Secondary | ICD-10-CM | POA: Diagnosis not present

## 2015-03-07 DIAGNOSIS — D2261 Melanocytic nevi of right upper limb, including shoulder: Secondary | ICD-10-CM | POA: Diagnosis not present

## 2015-03-07 DIAGNOSIS — L570 Actinic keratosis: Secondary | ICD-10-CM | POA: Diagnosis not present

## 2015-03-07 DIAGNOSIS — L814 Other melanin hyperpigmentation: Secondary | ICD-10-CM | POA: Diagnosis not present

## 2015-03-07 DIAGNOSIS — D2262 Melanocytic nevi of left upper limb, including shoulder: Secondary | ICD-10-CM | POA: Diagnosis not present

## 2015-03-07 DIAGNOSIS — Z85828 Personal history of other malignant neoplasm of skin: Secondary | ICD-10-CM | POA: Diagnosis not present

## 2015-03-07 DIAGNOSIS — D0359 Melanoma in situ of other part of trunk: Secondary | ICD-10-CM | POA: Diagnosis not present

## 2015-03-07 DIAGNOSIS — M5412 Radiculopathy, cervical region: Secondary | ICD-10-CM | POA: Diagnosis not present

## 2015-03-07 DIAGNOSIS — L821 Other seborrheic keratosis: Secondary | ICD-10-CM | POA: Diagnosis not present

## 2015-03-07 DIAGNOSIS — M5090 Cervical disc disorder, unspecified, unspecified cervical region: Secondary | ICD-10-CM | POA: Diagnosis not present

## 2015-03-09 DIAGNOSIS — M79645 Pain in left finger(s): Secondary | ICD-10-CM | POA: Diagnosis not present

## 2015-03-09 DIAGNOSIS — M25241 Flail joint, right hand: Secondary | ICD-10-CM | POA: Diagnosis not present

## 2015-03-09 DIAGNOSIS — M79642 Pain in left hand: Secondary | ICD-10-CM | POA: Diagnosis not present

## 2015-03-09 DIAGNOSIS — M659 Synovitis and tenosynovitis, unspecified: Secondary | ICD-10-CM | POA: Diagnosis not present

## 2015-03-11 ENCOUNTER — Ambulatory Visit (INDEPENDENT_AMBULATORY_CARE_PROVIDER_SITE_OTHER): Payer: Medicare Other | Admitting: *Deleted

## 2015-03-11 DIAGNOSIS — E538 Deficiency of other specified B group vitamins: Secondary | ICD-10-CM | POA: Diagnosis not present

## 2015-03-11 NOTE — Progress Notes (Signed)
Patient tolerated well.

## 2015-03-30 ENCOUNTER — Ambulatory Visit (INDEPENDENT_AMBULATORY_CARE_PROVIDER_SITE_OTHER): Payer: Medicare Other | Admitting: Family Medicine

## 2015-03-30 ENCOUNTER — Encounter: Payer: Self-pay | Admitting: Family Medicine

## 2015-03-30 VITALS — Ht 75.0 in | Wt 234.0 lb

## 2015-03-30 DIAGNOSIS — Z961 Presence of intraocular lens: Secondary | ICD-10-CM | POA: Diagnosis not present

## 2015-03-30 DIAGNOSIS — Z23 Encounter for immunization: Secondary | ICD-10-CM | POA: Diagnosis not present

## 2015-03-30 DIAGNOSIS — H04123 Dry eye syndrome of bilateral lacrimal glands: Secondary | ICD-10-CM | POA: Diagnosis not present

## 2015-03-30 DIAGNOSIS — E538 Deficiency of other specified B group vitamins: Secondary | ICD-10-CM

## 2015-03-30 DIAGNOSIS — R202 Paresthesia of skin: Secondary | ICD-10-CM | POA: Diagnosis not present

## 2015-03-30 DIAGNOSIS — I10 Essential (primary) hypertension: Secondary | ICD-10-CM | POA: Diagnosis not present

## 2015-03-30 LAB — POCT CBC
GRANULOCYTE PERCENT: 56.9 % (ref 37–80)
HCT, POC: 40.5 % — AB (ref 43.5–53.7)
HEMOGLOBIN: 13.2 g/dL — AB (ref 14.1–18.1)
Lymph, poc: 1.7 (ref 0.6–3.4)
MCH: 28.3 pg (ref 27–31.2)
MCHC: 32.5 g/dL (ref 31.8–35.4)
MCV: 87.1 fL (ref 80–97)
MPV: 8.9 fL (ref 0–99.8)
POC GRANULOCYTE: 3 (ref 2–6.9)
POC LYMPH PERCENT: 31.6 %L (ref 10–50)
Platelet Count, POC: 168 10*3/uL (ref 142–424)
RBC: 4.65 M/uL — AB (ref 4.69–6.13)
RDW, POC: 12.7 %
WBC: 5.3 10*3/uL (ref 4.6–10.2)

## 2015-03-30 MED ORDER — DOCUSATE SODIUM 100 MG PO CAPS: 100.0000 mg | ORAL_CAPSULE | Freq: Two times a day (BID) | ORAL | Status: DC

## 2015-03-30 MED ORDER — FOLIC ACID-VIT B6-VIT B12 2.5-25-1 MG PO TABS: 1.0000 | ORAL_TABLET | Freq: Every day | ORAL | Status: DC

## 2015-03-30 NOTE — Progress Notes (Signed)
Subjective:  Patient ID: Travis Padilla, male    DOB: [birth year]  Age: 69 y.o. MRN: [id number]  CC: Hypertension; Anemia; lesion on head; and Shoulder Pain   HPI WESLEE FOGG presents for  follow-up of hypertension. Patient has no history of headache chest pain or shortness of breath or recent cough. Patient also denies symptoms of TIA such as numbness weakness lateralizing. Patient checks  blood pressure at home and has not had any elevated readings recently. Patient denies side effects from his medication. States taking it regularly.   Also having pain in the left shoulder radiating from the superior margin of the trapezius into the ball of the shoulder and down the arm past the elbow. Severity of pain is moderate. It is intermittent and recurrent on a daily basis.  Patient has taken a course of subcutaneous B12 and is in today for recheck of his B12 deficiency anemia. Says his energy is adequate. He would like to investigate possible possibilities for the B12.  History Issac has a past medical history of Hypertension; Gout; Fungus infection; Hemorrhoid; Dizziness; Chest pain; Abnormal nuclear stress test; CAD (coronary artery disease); Hyperlipidemia; Skin cancer; Anemia; and Spinal stenosis.   He has past surgical history that includes Knee cartilage surgery (Left); Cataract extraction w/ intraocular lens  implant, bilateral; Blepharoptosis repair (Bilateral); Colonoscopy (N/A, 11/03/2014); and Skin cancer excision (12/2014).   His family history includes Heart attack in his father; Heart failure in his mother. There is no history of Colon cancer.He reports that he has quit smoking. His smoking use included Cigarettes. He started smoking about 46 years ago. He has a .4 pack-year smoking history. He has never used smokeless tobacco. He reports that he does not drink alcohol or use illicit drugs.  Outpatient Prescriptions Prior to Visit  Medication Sig Dispense Refill  . acetic acid (VOSOL)  2 % otic solution Place 4 drops into both ears 3 (three) times daily. 15 mL 1  . amLODipine (NORVASC) 5 MG tablet Take 1 tablet (5 mg total) by mouth daily. 90 tablet 3  . aspirin 81 MG tablet Take 1 tablet (81 mg total) by mouth once a week.    . carvedilol (COREG) 6.25 MG tablet Take 1 and 1/2 tablets by  mouth two times daily with  a meal 270 tablet 3  . Iron-Vit C-Vit B12-Folic Acid (IRON 885 PLUS PO) Take 1 tablet by mouth daily.    . meloxicam (MOBIC) 15 MG tablet Take 15 mg by mouth daily.    . pravastatin (PRAVACHOL) 40 MG tablet Take 1 tablet by mouth  daily 90 tablet 3  . indomethacin (INDOCIN) 50 MG capsule Take 1 capsule (50 mg total) by mouth 3 (three) times daily as needed. (Patient not taking: Reported on 03/30/2015) 21 capsule 0  . LORazepam (ATIVAN) 1 MG tablet Take 1 tablet (1 mg total) by mouth at bedtime. (Patient not taking: Reported on 02/23/2015) 90 tablet 0  . temazepam (RESTORIL) 30 MG capsule Take 1 capsule by mouth at bedtime as needed for sleep.     Marland Kitchen zolpidem (AMBIEN) 5 MG tablet Take 5 mg by mouth at bedtime as needed for sleep.   0  . allopurinol (ZYLOPRIM) 100 MG tablet Take 1 tablet (100 mg total) by mouth daily. (Patient not taking: Reported on 10/19/2014) 90 tablet 3  . cephALEXin (KEFLEX) 500 MG capsule Take 1 capsule (500 mg total) by mouth 3 (three) times daily. 21 capsule 0  . cholecalciferol (VITAMIN D)  1000 UNITS tablet Take 1,000 Units by mouth daily.    . cyanocobalamin (,VITAMIN B-12,) 1000 MCG/ML injection Inject one ML IM daily for a week then weekly for 4 weeks then monthly 10 mL 1  . sertraline (ZOLOFT) 50 MG tablet Take 1 tablet (50 mg total) by mouth daily. (Patient not taking: Reported on 12/29/2014) 30 tablet 11  . cyanocobalamin ((VITAMIN B-12)) injection 1,000 mcg      No facility-administered medications prior to visit.    ROS Review of Systems  Constitutional: Negative for fever, chills and diaphoresis.  HENT: Negative for congestion,  rhinorrhea and sore throat.   Respiratory: Negative for cough, shortness of breath and wheezing.   Cardiovascular: Negative for chest pain.  Gastrointestinal: Negative for nausea, vomiting, abdominal pain, diarrhea, constipation and abdominal distention.  Genitourinary: Negative for dysuria and frequency.  Musculoskeletal: Negative for joint swelling and arthralgias.  Skin: Negative for rash.  Neurological: Negative for headaches.    Objective:  Ht _0  (1.905 m)  Wt 234 lb (106.142 kg)  BMI 29.25 kg/m2  BP Readings from Last 3 Encounters:  02/23/15 132/77  02/02/15 118/78  12/29/14 113/71    Wt Readings from Last 3 Encounters:  03/30/15 234 lb (106.142 kg)  02/23/15 240 lb 3.2 oz (108.954 kg)  02/02/15 247 lb (112.038 kg)     Physical Exam  Constitutional: He is oriented to person, place, and time. He appears well-developed and well-nourished. No distress.  HENT:  Head: Normocephalic and atraumatic.  Right Ear: External ear normal.  Left Ear: External ear normal.  Nose: Nose normal.  Mouth/Throat: Oropharynx is clear and moist.  Eyes: Conjunctivae and EOM are normal. Pupils are equal, round, and reactive to light.  Neck: Normal range of motion. Neck supple. No thyromegaly present.  Cardiovascular: Normal rate, regular rhythm and normal heart sounds.   No murmur heard. Pulmonary/Chest: Effort normal and breath sounds normal. No respiratory distress. He has no wheezes. He has no rales.  Abdominal: Soft. Bowel sounds are normal. He exhibits no distension. There is no tenderness.  Lymphadenopathy:    He has no cervical adenopathy.  Neurological: He is alert and oriented to person, place, and time. He has normal reflexes.  Skin: Skin is warm and dry.  Psychiatric: He has a normal mood and affect. His behavior is normal. Judgment and thought content normal.    No results found for: HGBA1C  Lab Results  Component Value Date   WBC 5.3 03/30/2015   HGB 13.2* 03/30/2015     HCT 40.5* 03/30/2015   PLT 215 08/11/2014   GLUCOSE 99 12/29/2014   CHOL 138 08/04/2014   TRIG 105 08/04/2014   HDL 41 08/04/2014   LDLCALC 76 08/04/2014   ALT 22 12/29/2014   AST 18 12/29/2014   NA 142 12/29/2014   K 4.4 12/29/2014   CL 104 12/29/2014   CREATININE 0.97 12/29/2014   BUN 15 12/29/2014   CO2 21 12/29/2014   PSA 2.2 08/04/2014   INR 1.1* 08/21/2012    Dg Eye Foreign Body  11/18/2014   CLINICAL DATA:  Metal working/exposure; clearance prior to MRI  EXAM: ORBITS FOR FOREIGN BODY - 2 VIEW  COMPARISON:  Radiographs 10/20/2004  FINDINGS: There is no evidence of metallic foreign body within the orbits. No significant bone abnormality identified.  IMPRESSION: No evidence of metallic foreign body within the orbits.   Electronically Signed   By: Suzy Bouchard M.D.   On: 11/18/2014 14:51    Assessment &  Plan:   Edy was seen today for hypertension, anemia, lesion on head and shoulder pain.  Diagnoses and all orders for this visit:  Essential hypertension, benign Orders: -     POCT CBC -     CMP14+EGFR  Vitamin B12 deficiency Orders: -     POCT CBC -     Vitamin B12  Paresthesia of left upper extremity Orders: -     NCV with EMG(electromyography); Future  Other orders -     Pneumococcal conjugate vaccine 13-valent -     docusate sodium (COLACE) 100 MG capsule; Take 1 capsule (100 mg total) by mouth 2 (two) times daily. -     Folic Acid-Vit V7-VNR W41 (FOLBEE) 2.5-25-1 MG TABS tablet; Take 1 tablet by mouth daily.   I have discontinued Mr. Kniskern cholecalciferol, allopurinol, cyanocobalamin, sertraline, and cephALEXin. I am also having him start on docusate sodium and Folic Acid-Vit J6-CBI P77. Additionally, I am having him maintain his acetic acid, Iron-Vit C-Vit B12-Folic Acid (IRON 939 PLUS PO), zolpidem, temazepam, LORazepam, meloxicam, aspirin, pravastatin, carvedilol, amLODipine, and indomethacin. We will stop administering cyanocobalamin.  Meds  ordered this encounter  Medications  . docusate sodium (COLACE) 100 MG capsule    Sig: Take 1 capsule (100 mg total) by mouth 2 (two) times daily.    Dispense:  10 capsule    Refill:  0  . Folic Acid-Vit S8-GAY G47 (FOLBEE) 2.5-25-1 MG TABS tablet    Sig: Take 1 tablet by mouth daily.    Dispense:  90 tablet    Refill:  3     Follow-up: Return in about 6 months (around 09/30/2015).  Claretta Fraise, M.D.

## 2015-03-30 NOTE — Patient Instructions (Signed)
Medicare Annual Wellness Visit  [facility] and the medical providers at [facility] strive to bring you the best medical care.  In doing so we not only want to address your current medical conditions and concerns but also to detect new conditions early and prevent illness, disease and health-related problems.    Medicare offers a yearly Wellness Visit which allows our clinical staff to assess your need for preventative services including immunizations, lifestyle education, counseling to decrease risk of preventable diseases and screening for fall risk and other medical concerns.    This visit is provided free of charge (no copay) for all Medicare recipients. The clinical pharmacists at [facility] have begun to conduct these Wellness Visits which will also include a thorough review of all your medications.    As you primary medical provider recommend that you make an appointment for your Annual Wellness Visit if you have not done so already this year.  You may set up this appointment before you leave today or you may call back ([phone]) and schedule an appointment.  Please make sure when you call that you mention that you are scheduling your Annual Wellness Visit with the clinical pharmacist so that the appointment may be made for the proper length of time.     Continue current medications. Continue good therapeutic lifestyle changes which include good diet and exercise. Fall precautions discussed with patient. If an FOBT was given today- please return it to our front desk. If you are over 50 years old - you may need Prevnar 13 or the adult Pneumonia vaccine.  Flu Shots are still available at our office. If you still haven't had one please call to set up a nurse visit to get one.   After your visit with us today you will receive a survey in the mail or online from [employer] regarding your care with us. Please take a moment to  fill this out. Your feedback is very important to us as you can help us better understand your patient needs as well as improve your experience and satisfaction. WE CARE ABOUT YOU!!!   

## 2015-03-31 LAB — CMP14+EGFR
ALK PHOS: 66 IU/L (ref 39–117)
ALT: 17 IU/L (ref 0–44)
AST: 18 IU/L (ref 0–40)
Albumin/Globulin Ratio: 1.5 (ref 1.1–2.5)
Albumin: 4.5 g/dL (ref 3.6–4.8)
BILIRUBIN TOTAL: 0.7 mg/dL (ref 0.0–1.2)
BUN / CREAT RATIO: 17 (ref 10–22)
BUN: 17 mg/dL (ref 8–27)
CO2: 18 mmol/L (ref 18–29)
CREATININE: 1.02 mg/dL (ref 0.76–1.27)
Calcium: 9.4 mg/dL (ref 8.6–10.2)
Chloride: 102 mmol/L (ref 97–108)
GFR calc non Af Amer: 76 mL/min/{1.73_m2} (ref >59)
GFR, EST AFRICAN AMERICAN: 88 mL/min/{1.73_m2} (ref >59)
GLOBULIN, TOTAL: 3 g/dL (ref 1.5–4.5)
Glucose: 98 mg/dL (ref 65–99)
POTASSIUM: 4.7 mmol/L (ref 3.5–5.2)
Sodium: 141 mmol/L (ref 134–144)
TOTAL PROTEIN: 7.5 g/dL (ref 6.0–8.5)

## 2015-03-31 LAB — VITAMIN B12: Vitamin B-12: 335 pg/mL (ref 211–946)

## 2015-04-04 ENCOUNTER — Telehealth: Payer: Self-pay | Admitting: Family Medicine

## 2015-04-04 MED ORDER — TRAZODONE HCL 150 MG PO TABS: 150.0000 mg | ORAL_TABLET | Freq: Every day | ORAL | Status: DC

## 2015-04-04 NOTE — Telephone Encounter (Signed)
Pt states that new sleep med has not been sent to [facility] as discuss at visit on 7/20  If phone in Rx please sent to pool A.

## 2015-04-04 NOTE — Telephone Encounter (Signed)
Prescription sent. Please notify patient. Give him my apology for my oversight. Thanks, WS.

## 2015-04-04 NOTE — Telephone Encounter (Signed)
Pt aware Rx sent to [facility]

## 2015-04-13 DIAGNOSIS — M5092 Cervical disc disorder, unspecified, mid-cervical region: Secondary | ICD-10-CM | POA: Diagnosis not present

## 2015-04-18 ENCOUNTER — Ambulatory Visit (HOSPITAL_COMMUNITY)
Admission: RE | Admit: 2015-04-18 | Discharge: 2015-04-18 | Disposition: A | Discharge status: Home / Self Care | Payer: Medicare Other | Source: Ambulatory Visit | Attending: Family Medicine | Admitting: Family Medicine

## 2015-04-18 ENCOUNTER — Ambulatory Visit (INDEPENDENT_AMBULATORY_CARE_PROVIDER_SITE_OTHER): Payer: Medicare Other | Admitting: Family Medicine

## 2015-04-18 ENCOUNTER — Telehealth: Payer: Self-pay | Admitting: Family Medicine

## 2015-04-18 ENCOUNTER — Encounter: Payer: Self-pay | Admitting: Family Medicine

## 2015-04-18 ENCOUNTER — Ambulatory Visit (INDEPENDENT_AMBULATORY_CARE_PROVIDER_SITE_OTHER): Payer: Medicare Other

## 2015-04-18 VITALS — BP 164/91 | HR 91 | Temp 96.9°F | Ht 75.0 in | Wt 236.2 lb

## 2015-04-18 DIAGNOSIS — N200 Calculus of kidney: Secondary | ICD-10-CM

## 2015-04-18 DIAGNOSIS — N201 Calculus of ureter: Secondary | ICD-10-CM | POA: Diagnosis not present

## 2015-04-18 DIAGNOSIS — R1031 Right lower quadrant pain: Secondary | ICD-10-CM | POA: Diagnosis not present

## 2015-04-18 DIAGNOSIS — R319 Hematuria, unspecified: Secondary | ICD-10-CM | POA: Diagnosis not present

## 2015-04-18 DIAGNOSIS — R1030 Lower abdominal pain, unspecified: Secondary | ICD-10-CM | POA: Diagnosis not present

## 2015-04-18 DIAGNOSIS — N1330 Unspecified hydronephrosis: Secondary | ICD-10-CM | POA: Diagnosis not present

## 2015-04-18 DIAGNOSIS — K5730 Diverticulosis of large intestine without perforation or abscess without bleeding: Secondary | ICD-10-CM | POA: Diagnosis not present

## 2015-04-18 DIAGNOSIS — R109 Unspecified abdominal pain: Secondary | ICD-10-CM

## 2015-04-18 DIAGNOSIS — N132 Hydronephrosis with renal and ureteral calculous obstruction: Secondary | ICD-10-CM | POA: Diagnosis not present

## 2015-04-18 LAB — POCT UA - MICROSCOPIC ONLY
CASTS, UR, LPF, POC: NEGATIVE
CRYSTALS, UR, HPF, POC: NEGATIVE
Yeast, UA: NEGATIVE

## 2015-04-18 LAB — POCT URINALYSIS DIPSTICK
BILIRUBIN UA: NEGATIVE
Glucose, UA: NEGATIVE
KETONES UA: NEGATIVE
Leukocytes, UA: NEGATIVE
NITRITE UA: NEGATIVE
PH UA: 5
Urobilinogen, UA: NEGATIVE

## 2015-04-18 MED ORDER — HYDROCODONE-ACETAMINOPHEN 5-325 MG PO TABS: 1.0000 | ORAL_TABLET | Freq: Four times a day (QID) | ORAL | Status: DC | PRN

## 2015-04-18 MED ORDER — KETOROLAC TROMETHAMINE 60 MG/2ML IM SOLN
60.0000 mg | Freq: Once | INTRAMUSCULAR | Status: AC
  Administered 2015-04-18: 60 mg via INTRAMUSCULAR

## 2015-04-18 NOTE — Assessment & Plan Note (Signed)
CT results today show 2 stones 3-5 mm in diameter at the UPJ. He has moderate hydronephrosis I have called the urologist and discussed the need for follow-up tomorrow and appropriate treatment in the meantime, we will work on an appointment for him tomorrow urology. Will ask nursing to call and finish coordination with patient

## 2015-04-18 NOTE — Progress Notes (Signed)
Pt informed about kidney stones and the referral sent to urologist.

## 2015-04-18 NOTE — Progress Notes (Signed)
Patient ID: Travis Padilla, male   DOB: 1946-03-18, 69 y.o.   MRN: [id number]   HPI  Patient presents today for evaluation of right flank pain  Patient explains that he woke up this morning around 3 AM with acute onset 7 out of 10 right abdominal/flank pain. He states that initially it was colicky and came in waves, now however it's continuous with some intermittent worsening. He describes it as a dull pain that radiates from his right flank to his right groin. It is similar to previous pain he had renal stones.  He denies hematuria. He denies fever He has loss of appetite today He denies chest pain, dyspnea, palpitations.  He took 400 mg for this morning which helped the pain minimally.  PMH: Smoking status noted ROS: Per HPI  Objective: BP 164/91 mmHg  Pulse 91  Temp(Src) 96.9 F (36.1 C) (Oral)  Ht '6\' 3"'  (1.905 m)  Wt 236 lb 3.2 oz (107.14 kg)  BMI 29.52 kg/m2 Gen: NAD, alert, cooperative with exam HEENT: NCAT CV: RRR, good S1/S2, no murmur Resp: CTABL, no wheezes, non-labored Abd: soft, moderate tenderness to palpation of right lower quadrant, also moderate CVA tenderness on the right side, positive BS, no rebound  Ext: No edema, warm Neuro: Alert and oriented, No gross deficits  Assessment and plan:  Right flank pain ikely renal calculus, still consider acuteappendicitis Labs sent Urine shows large blood with 10-12 RBCs per high-power field Has a history of renal stones with surgery removed, as established patient care with urology in [place] Stat CT scan to rule out appendicitis and also to evaluate renal stone, will follow-up as results come back   Orders Placed This Encounter  Procedures  . DG Abd 1 View    Order Specific Question:  Reason for Exam (SYMPTOM  OR DIAGNOSIS REQUIRED)    Answer:  rlq pain    Order Specific Question:  Preferred imaging location?    Answer:  Internal  . CT Abdomen Pelvis W Contrast    J[facility]/DEBBIE    MEDICARE/AARP NO PAC NEEDED     HOLD PT AND CALL REPORT      CREAT 1.02 DRAWN 07/20    Standing Status: Future     Number of Occurrences:      Standing Expiration Date: 07/18/2016    Order Specific Question:  Reason for Exam (SYMPTOM  OR DIAGNOSIS REQUIRED)    Answer:  R flank pain, likely renal stone    Order Specific Question:  Preferred imaging location?    Answer:  [facility]  . CBC with Differential/Platelet  . CMP14+EGFR  . POCT UA - Microscopic Only  . POCT urinalysis dipstick    Meds ordered this encounter  Medications  . HYDROcodone-acetaminophen (NORCO) 5-325 MG per tablet    Sig: Take 1-2 tablets by mouth every 6 (six) hours as needed for moderate pain.    Dispense:  45 tablet    Refill:  0

## 2015-04-18 NOTE — Assessment & Plan Note (Addendum)
ikely renal calculus, still consider acuteappendicitis Labs sent Urine shows large blood with 10-12 RBCs per high-power field Has a history of renal stones with surgery removed, as established patient care with urology in [place] Stat CT scan to rule out appendicitis and also to evaluate likely renal stone, will follow-up as results come back

## 2015-04-18 NOTE — Patient Instructions (Signed)
Great to meet you!  We will call you as soon as we can get results about your CT scan, we will call urology if need be.   If you develop pain not controlled by the medication,if you're unable to tolerate food or fluids by mouth, or if you develop fevers please seek emergency medical care.

## 2015-04-18 NOTE — Telephone Encounter (Signed)
Called to update patient on results, see note from today for more details.  Laroy Apple, MD [facility] 04/18/2015, 1:50 PM

## 2015-04-19 LAB — CBC WITH DIFFERENTIAL/PLATELET
BASOS ABS: 0 10*3/uL (ref 0.0–0.2)
BASOS: 0 %
EOS (ABSOLUTE): 0.1 10*3/uL (ref 0.0–0.4)
EOS: 1 %
Hematocrit: 39.4 % (ref 37.5–51.0)
Hemoglobin: 13.3 g/dL (ref 12.6–17.7)
IMMATURE GRANS (ABS): 0 10*3/uL (ref 0.0–0.1)
Immature Granulocytes: 0 %
LYMPHS: 16 %
Lymphocytes Absolute: 1.4 10*3/uL (ref 0.7–3.1)
MCH: 29.8 pg (ref 26.6–33.0)
MCHC: 33.8 g/dL (ref 31.5–35.7)
MCV: 88 fL (ref 79–97)
Monocytes Absolute: 0.8 10*3/uL (ref 0.1–0.9)
Monocytes: 9 %
Neutrophils Absolute: 6.2 10*3/uL (ref 1.4–7.0)
Neutrophils: 74 %
Platelets: 226 10*3/uL (ref 150–379)
RBC: 4.46 x10E6/uL (ref 4.14–5.80)
RDW: 14 % (ref 12.3–15.4)
WBC: 8.6 10*3/uL (ref 3.4–10.8)

## 2015-04-19 LAB — CMP14+EGFR
A/G RATIO: 1.5 (ref 1.1–2.5)
ALK PHOS: 59 IU/L (ref 39–117)
ALT: 12 IU/L (ref 0–44)
AST: 17 IU/L (ref 0–40)
Albumin: 4.4 g/dL (ref 3.6–4.8)
BUN / CREAT RATIO: 15 (ref 10–22)
BUN: 18 mg/dL (ref 8–27)
Bilirubin Total: 0.7 mg/dL (ref 0.0–1.2)
CHLORIDE: 100 mmol/L (ref 97–108)
CO2: 21 mmol/L (ref 18–29)
Calcium: 9.4 mg/dL (ref 8.6–10.2)
Creatinine, Ser: 1.22 mg/dL (ref 0.76–1.27)
GFR, EST AFRICAN AMERICAN: 70 mL/min/{1.73_m2} (ref >59)
GFR, EST NON AFRICAN AMERICAN: 61 mL/min/{1.73_m2} (ref >59)
GLOBULIN, TOTAL: 2.9 g/dL (ref 1.5–4.5)
GLUCOSE: 107 mg/dL — AB (ref 65–99)
Potassium: 5.2 mmol/L (ref 3.5–5.2)
Sodium: 137 mmol/L (ref 134–144)
Total Protein: 7.3 g/dL (ref 6.0–8.5)

## 2015-04-20 DIAGNOSIS — N201 Calculus of ureter: Secondary | ICD-10-CM | POA: Diagnosis not present

## 2015-04-21 ENCOUNTER — Other Ambulatory Visit: Payer: Self-pay | Admitting: Urology

## 2015-04-26 DIAGNOSIS — Z85828 Personal history of other malignant neoplasm of skin: Secondary | ICD-10-CM | POA: Diagnosis not present

## 2015-04-26 DIAGNOSIS — L089 Local infection of the skin and subcutaneous tissue, unspecified: Secondary | ICD-10-CM | POA: Diagnosis not present

## 2015-04-26 DIAGNOSIS — L820 Inflamed seborrheic keratosis: Secondary | ICD-10-CM | POA: Diagnosis not present

## 2015-04-26 DIAGNOSIS — D0359 Melanoma in situ of other part of trunk: Secondary | ICD-10-CM | POA: Diagnosis not present

## 2015-04-29 ENCOUNTER — Encounter (HOSPITAL_COMMUNITY): Payer: Self-pay | Admitting: *Deleted

## 2015-05-02 ENCOUNTER — Ambulatory Visit (HOSPITAL_COMMUNITY)
Admission: RE | Admit: 2015-05-02 | Discharge: 2015-05-02 | Disposition: A | Discharge status: Home / Self Care | Payer: Medicare Other | Source: Ambulatory Visit | Attending: Urology | Admitting: Urology

## 2015-05-02 ENCOUNTER — Encounter (HOSPITAL_COMMUNITY): Payer: Self-pay | Admitting: *Deleted

## 2015-05-02 ENCOUNTER — Ambulatory Visit (HOSPITAL_COMMUNITY): Payer: Medicare Other

## 2015-05-02 ENCOUNTER — Encounter (HOSPITAL_COMMUNITY)
Admission: RE | Disposition: A | Discharge status: Home / Self Care | Payer: Self-pay | Source: Ambulatory Visit | Attending: Urology

## 2015-05-02 DIAGNOSIS — Z538 Procedure and treatment not carried out for other reasons: Secondary | ICD-10-CM | POA: Diagnosis not present

## 2015-05-02 DIAGNOSIS — Z791 Long term (current) use of non-steroidal anti-inflammatories (NSAID): Secondary | ICD-10-CM | POA: Diagnosis not present

## 2015-05-02 DIAGNOSIS — Z01818 Encounter for other preprocedural examination: Secondary | ICD-10-CM | POA: Diagnosis not present

## 2015-05-02 DIAGNOSIS — Z7982 Long term (current) use of aspirin: Secondary | ICD-10-CM | POA: Insufficient documentation

## 2015-05-02 DIAGNOSIS — N201 Calculus of ureter: Secondary | ICD-10-CM | POA: Insufficient documentation

## 2015-05-02 DIAGNOSIS — I499 Cardiac arrhythmia, unspecified: Secondary | ICD-10-CM | POA: Diagnosis not present

## 2015-05-02 HISTORY — PX: EXTRACORPOREAL SHOCK WAVE LITHOTRIPSY: SHX1557

## 2015-05-02 SURGERY — LITHOTRIPSY, ESWL
Anesthesia: LOCAL | Laterality: Right

## 2015-05-02 MED ORDER — DIPHENHYDRAMINE HCL 25 MG PO CAPS
25.0000 mg | ORAL_CAPSULE | ORAL | Status: AC
  Filled 2015-05-02: qty 1

## 2015-05-02 MED ORDER — CIPROFLOXACIN HCL 500 MG PO TABS
500.0000 mg | ORAL_TABLET | ORAL | Status: AC
  Filled 2015-05-02: qty 1

## 2015-05-02 MED ORDER — DIAZEPAM 5 MG PO TABS
10.0000 mg | ORAL_TABLET | ORAL | Status: AC
  Filled 2015-05-02: qty 2

## 2015-05-02 MED ORDER — SODIUM CHLORIDE 0.9 % IV SOLN
INTRAVENOUS | Status: DC
  Administered 2015-05-02: 11:00:00 via INTRAVENOUS

## 2015-05-02 NOTE — Discharge Instructions (Signed)
You did not have any sedation today. You may drive and go out to lunch. Cancel your appointment in September with [facility].  [phone]

## 2015-05-02 NOTE — Progress Notes (Addendum)
Patient has taken aspirin on 04/29/15 at 1800 prior to his ESWL today. Paged Dr Alyson Ingles to look at KUB to determine if stone is in renal shadow.  [place]   Dr Alyson Ingles up to see patient. Stone outside renal shadow. Procedure to be done earlier; therefore, premedications held. To receive sedation on lithotripsy unit.  77  Procedure cancelled as MD unable to visualize kidney stone.

## 2015-05-06 ENCOUNTER — Telehealth: Payer: Self-pay

## 2015-05-06 NOTE — Telephone Encounter (Signed)
Second attempt to call pt about the results of his dermatology bx. Left vm each time to call back.

## 2015-05-09 ENCOUNTER — Encounter: Payer: Self-pay | Admitting: Family Medicine

## 2015-05-09 ENCOUNTER — Ambulatory Visit (INDEPENDENT_AMBULATORY_CARE_PROVIDER_SITE_OTHER): Payer: Medicare Other | Admitting: Family Medicine

## 2015-05-09 VITALS — BP 116/70 | HR 61 | Temp 97.4°F | Ht 74.0 in | Wt 233.2 lb

## 2015-05-09 DIAGNOSIS — Z4802 Encounter for removal of sutures: Secondary | ICD-10-CM | POA: Diagnosis not present

## 2015-05-09 DIAGNOSIS — H9202 Otalgia, left ear: Secondary | ICD-10-CM | POA: Diagnosis not present

## 2015-05-09 DIAGNOSIS — H9209 Otalgia, unspecified ear: Secondary | ICD-10-CM | POA: Insufficient documentation

## 2015-05-09 NOTE — Progress Notes (Signed)
   HPI  Patient presents today for ear pain  Patient with L ear pain c/w previous episodes of swimmers ear No drainage No fever, malaise, appetite loss Describes L sided piercing ear pain, non radiating, lasts a few moments No aggravating alleviating factors Using ciprodex  PMH: Smoking status noted ROS: Per HPI  Objective: BP 116/70 mmHg  Pulse 61  Temp(Src) 97.4 F (36.3 C) (Oral)  Ht '6\' 2"'$  (1.88 m)  Wt 233 lb 3.2 oz (105.779 kg)  BMI 29.93 kg/m2 Gen: NAD, alert, cooperative with exam HEENT: NCAT, Tms WNL BL, Nares clear, No pain with palp of TMJ BL CV: RRR, good S1/S2, no murmur Resp: CTABL, no wheezes, non-labored Ext: No edema, warm Neuro: Alert and oriented, No gross deficits  Assessment and plan:  # ear pain No clear etiology, continue to treat as swimmers ear  for now Come back if cfails to improve or worsens No signs of Acute OM or TMJ  Laroy Apple, MD [facility] 05/09/2015, 10:05 AM

## 2015-05-09 NOTE — Patient Instructions (Addendum)
Great to see you!  Come back if your ear gets worse, fails to resolve, or if you develop an illness (fever, decreased appetite/energy)  If your blood pressure stays low, please let Dr. Warren Lacy or me know

## 2015-05-26 ENCOUNTER — Other Ambulatory Visit: Payer: Self-pay

## 2015-05-26 MED ORDER — LORAZEPAM 0.5 MG PO TABS: 0.5000 mg | ORAL_TABLET | Freq: Two times a day (BID) | ORAL | Status: DC | PRN

## 2015-05-26 NOTE — Telephone Encounter (Signed)
Last seen 05/09/15  Dr Wendi Snipes  This med not on EPIC list   Requesting from mail order  If approved print for patient to send in

## 2015-05-26 NOTE — Telephone Encounter (Signed)
Covering for PCP, Dr. Livia Snellen  Refill xanax, #30.   Ask nursng to phone in.   Laroy Apple, MD [facility] 05/26/2015, 5:01 PM

## 2015-05-26 NOTE — Telephone Encounter (Signed)
Medication called in to [facility]

## 2015-05-29 DIAGNOSIS — Z23 Encounter for immunization: Secondary | ICD-10-CM | POA: Diagnosis not present

## 2015-06-01 ENCOUNTER — Other Ambulatory Visit: Payer: Self-pay

## 2015-06-01 NOTE — Telephone Encounter (Signed)
Last seen 05/09/15 Dr Wendi Snipes  If approved print  This is for mail order

## 2015-06-02 NOTE — Telephone Encounter (Signed)
Patient has already picked rx and says this has been taken care of

## 2015-06-02 NOTE — Telephone Encounter (Signed)
Refilled 1 week ago, will ask nursing to look into.   Laroy Apple, MD [facility] 06/02/2015, 7:48 AM

## 2015-07-20 ENCOUNTER — Telehealth: Payer: Self-pay | Admitting: Cardiology

## 2015-07-20 NOTE — Telephone Encounter (Signed)
Patient has questions regarding the medication he is taking for his palpations---he states he is still having them.

## 2015-07-20 NOTE — Telephone Encounter (Signed)
Returned call to patient, who reports episodic palpitations, particularly after exercise, which seem to be increasing in frequency. He has noted these for months, but they have become more obvious recently.  Had noticed generally after exercise when he checks his pulse, he will have 2-3 "skipped" beats in period of 10-15 seconds. Denies SOB, CP, but reports "not sleeping well".  BP 118/80, HR unreported. Put on available slot on Dr. Rosezella [place] schedule for tomorrow, gave pt appt info. Advised to call if further concerns prior to appt.

## 2015-07-21 ENCOUNTER — Encounter: Payer: Self-pay | Admitting: Cardiology

## 2015-07-21 ENCOUNTER — Ambulatory Visit (INDEPENDENT_AMBULATORY_CARE_PROVIDER_SITE_OTHER): Payer: Medicare Other | Admitting: Cardiology

## 2015-07-21 VITALS — BP 132/78 | HR 77 | Ht 74.0 in | Wt 239.1 lb

## 2015-07-21 DIAGNOSIS — I493 Ventricular premature depolarization: Secondary | ICD-10-CM

## 2015-07-21 DIAGNOSIS — R5383 Other fatigue: Secondary | ICD-10-CM

## 2015-07-21 LAB — TSH: TSH: 5.336 u[IU]/mL — ABNORMAL HIGH (ref 0.350–4.500)

## 2015-07-21 MED ORDER — DILTIAZEM HCL ER COATED BEADS 120 MG PO CP24: 120.0000 mg | ORAL_CAPSULE | Freq: Every day | ORAL | Status: DC

## 2015-07-21 NOTE — Progress Notes (Signed)
HPI The patient presents for followup of palpitations.  He had chest pain evaluated in December of 2013 with a stress perfusion study which suggested LAD ischemia. However, followup cardiac catheterization demonstrated only mild nonobstructive plaque.  He has had a mildly reduced ejection fraction both on stress testing and cath.  Follow up echo this  Year demonstrated his EF was slightly better than previous at 50-55%. He does have palpitations with ectopy noted previously.   He has been having increasing palpitations. Happening frequently throughout the day. He's describing skipped beats. He is not describing sustained tachyarrhythmias. He feels it all over his body. He's not had any presyncope or syncope. He's not able to bring this on. He denies any chest pressure, neck or arm discomfort. He's had no new shortness of breath, PND or orthopnea.  No Known Allergies  Current Outpatient Prescriptions  Medication Sig Dispense Refill  . amLODipine (NORVASC) 5 MG tablet Take 1 tablet (5 mg total) by mouth daily. (Patient taking differently: Take 5 mg by mouth every morning. ) 90 tablet 3  . aspirin 81 MG tablet Take 1 tablet (81 mg total) by mouth once a week. (Patient taking differently: Take 81 mg by mouth at bedtime. )    . carvedilol (COREG) 6.25 MG tablet Take 1 and 1/2 tablets by  mouth two times daily with  a meal 270 tablet 3  . Folic Acid-Vit V8-LFY B01 (FOLBEE) 2.5-25-1 MG TABS tablet Take 1 tablet by mouth daily. 90 tablet 3  . HYDROcodone-acetaminophen (NORCO) 5-325 MG per tablet Take 1-2 tablets by mouth every 6 (six) hours as needed for moderate pain. 45 tablet 0  . ibuprofen (ADVIL,MOTRIN) 200 MG tablet Take 400 mg by mouth every 6 (six) hours as needed for moderate pain.    . Iron-Vit C-Vit B12-Folic Acid (IRON 751 PLUS PO) Take 1 tablet by mouth every morning.     Marland Kitchen LORazepam (ATIVAN) 0.5 MG tablet Take 1 tablet (0.5 mg total) by mouth 2 (two) times daily as needed for anxiety. 30  tablet 0  . OVER THE COUNTER MEDICATION Place 1 drop into both eyes 2 (two) times daily as needed (dry eyes.).    Marland Kitchen pravastatin (PRAVACHOL) 40 MG tablet Take 1 tablet by mouth  daily (Patient taking differently: Take 40 mg by mouth every evening. ) 90 tablet 3   No current facility-administered medications for this visit.    Past Medical History  Diagnosis Date  . Hypertension   . Gout   . 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 Hemorrhoid   . Dizziness   . Chest pain     December, 2013  . Abnormal nuclear stress test     December, 2013  . CAD (coronary artery disease)     Mild nonobstructive plaque in cath 2013  . Hyperlipidemia   . Skin cancer   . Anemia   . Spinal stenosis     Past Surgical History  Procedure Laterality Date  . Knee cartilage surgery Left     Left knee  . Cataract extraction w/ intraocular lens  implant, bilateral    . Belpharoptosis repair Bilateral   . Colonoscopy N/A 11/03/2014    Procedure: COLONOSCOPY;  Surgeon: Rogene Houston, MD;  Location: [facility];  Service: Endoscopy;  Laterality: N/A;  1225  . Skin cancer excision  12/2014, 04/25/15    ROS:  Neck and arm pain secondary to spinal stenosis.  Otherwise as stated in the  HPI and negative for all other systems  PHYSICAL EXAM BP 132/78 mmHg  Pulse 77  Ht '6\' 2"'$  (1.88 m)  Wt 239 lb 1.6 oz (108.455 kg)  BMI 30.69 kg/m2 GENERAL:  Well appearing NECK:  No jugular venous distention, waveform within normal limits, carotid upstroke brisk and symmetric, no bruits, no thyromegaly LUNGS:  Clear to auscultation bilaterally CHEST:  Unremarkable HEART:  PMI not displaced or sustained,S1 and S2 within normal limits, no S3, no S4, no clicks, no rubs, no murmurs ABD:  Flat, positive bowel sounds normal in frequency in pitch, no bruits, no rebound, no guarding, no midline pulsatile mass, no hepatomegaly, no splenomegaly EXT:  2 plus pulses throughout, no edema, no cyanosis no clubbing, right radial access site  without erythema or bruising.    EKG:  Sinus rhythm, rate 77, axis within normal limits, intervals within normal limits, no acute ST-T wave changes. Ectopy.   07/21/2015  ASSESSMENT AND PLAN  CAD -  He has nonobstructive disease. He will continue with risk reduction.  Of note I did suggest that he start taking aspirin 81 mg daily.  ESSENTIAL HYPERTENSION, BENIGN -  This is being managed in the context of treating his PVCs  PALPITATIONS -   He has PVCs as noted on the EKG. I'm going to switch him to Cardizem CD 120 mg stopping the Norvasc. He will let me know if this helps. Further evaluation and management will be based on his response to this change.  CARDIOMYOPATHY This is very mild. I will follow this clinically.  DYSLIPIDEMIA -  He is doing well on his pravastatin and his LDL was 76 when last checked. He will remain on the meds as listed.

## 2015-07-21 NOTE — Patient Instructions (Signed)
Your physician recommends that you schedule a follow-up appointment in: [place] has recommended you make the following change in your medication: STOP Amlodipine and START Cardizem 120 mg daily  Your physician recommends that you return for lab work in: [facility]

## 2015-08-22 ENCOUNTER — Other Ambulatory Visit: Payer: Self-pay | Admitting: *Deleted

## 2015-08-22 MED ORDER — DILTIAZEM HCL ER COATED BEADS 120 MG PO CP24: 120.0000 mg | ORAL_CAPSULE | Freq: Every day | ORAL | Status: DC

## 2015-08-30 ENCOUNTER — Ambulatory Visit (INDEPENDENT_AMBULATORY_CARE_PROVIDER_SITE_OTHER): Payer: Medicare Other | Admitting: Cardiology

## 2015-08-30 ENCOUNTER — Encounter: Payer: Self-pay | Admitting: Cardiology

## 2015-08-30 VITALS — BP 136/74 | HR 76 | Ht 74.0 in | Wt 241.0 lb

## 2015-08-30 DIAGNOSIS — I10 Essential (primary) hypertension: Secondary | ICD-10-CM | POA: Diagnosis not present

## 2015-08-30 NOTE — Patient Instructions (Addendum)
Your physician wants you to follow-up in: 6 Months. You will receive a reminder letter in the mail two months in advance. If you don't receive a letter, please call our office to schedule the follow-up appointment.  Merry Christmas and Happy New Year!!

## 2015-08-30 NOTE — Progress Notes (Signed)
HPI The patient presents for followup of palpitations.  At the last visit I stopped Norvasc and started Cardizem.  He says his palpitations are improved. He's had thinks some higher blood pressures but he's not been taking his blood pressure. It's been a little higher the last 2 readings in the office .  He is having a little bit of lightheadedness occasionally but no presyncope or syncope. He actually feels better than he did previously. He's not having any chest pressure. He is exercising routinely.   No Known Allergies  Current Outpatient Prescriptions  Medication Sig Dispense Refill  . aspirin 81 MG tablet Take 1 tablet (81 mg total) by mouth once a week. (Patient taking differently: Take 81 mg by mouth at bedtime. )    . carvedilol (COREG) 6.25 MG tablet Take 1 and 1/2 tablets by  mouth two times daily with  a meal 270 tablet 3  . diltiazem (CARDIZEM CD) 120 MG 24 hr capsule Take 1 capsule (120 mg total) by mouth daily. 90 capsule 2  . Folic Acid-Vit I1-WER X54 (FOLBEE) 2.5-25-1 MG TABS tablet Take 1 tablet by mouth daily. 90 tablet 3  . HYDROcodone-acetaminophen (NORCO) 5-325 MG per tablet Take 1-2 tablets by mouth every 6 (six) hours as needed for moderate pain. 45 tablet 0  . ibuprofen (ADVIL,MOTRIN) 200 MG tablet Take 400 mg by mouth every 6 (six) hours as needed for moderate pain.    . Iron-Vit C-Vit B12-Folic Acid (IRON 008 PLUS PO) Take 1 tablet by mouth every morning.     Marland Kitchen LORazepam (ATIVAN) 0.5 MG tablet Take 1 tablet (0.5 mg total) by mouth 2 (two) times daily as needed for anxiety. 30 tablet 0  . OVER THE COUNTER MEDICATION Place 1 drop into both eyes 2 (two) times daily as needed (dry eyes.).    Marland Kitchen pravastatin (PRAVACHOL) 40 MG tablet Take 1 tablet by mouth  daily (Patient taking differently: Take 40 mg by mouth every evening. ) 90 tablet 3  . sertraline (ZOLOFT) 50 MG tablet Patient only uses as needed. Never really uses.     No current facility-administered medications for  this visit.    Past Medical History  Diagnosis Date  . Hypertension   . Gout   . 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 Hemorrhoid   . Dizziness   . Chest pain     December, 2013  . Abnormal nuclear stress test     December, 2013  . CAD (coronary artery disease)     Mild nonobstructive plaque in cath 2013  . Hyperlipidemia   . Skin cancer   . Anemia   . Spinal stenosis     Past Surgical History  Procedure Laterality Date  . Knee cartilage surgery Left     Left knee  . Cataract extraction w/ intraocular lens  implant, bilateral    . Belpharoptosis repair Bilateral   . Colonoscopy N/A 11/03/2014    Procedure: COLONOSCOPY;  Surgeon: Rogene Houston, MD;  Location: [facility];  Service: Endoscopy;  Laterality: N/A;  1225  . Skin cancer excision  12/2014, 04/25/15    ROS:  Neck and arm pain secondary to spinal stenosis.  Otherwise as stated in the HPI and negative for all other systems  PHYSICAL EXAM BP 136/74 mmHg  Pulse 76  Ht '6\' 2"'$  (1.88 m)  Wt 241 lb (109.317 kg)  BMI 30.93 kg/m2  SpO2 98% GENERAL:  Well appearing NECK:  No  jugular venous distention, waveform within normal limits, carotid upstroke brisk and symmetric, no bruits, no thyromegaly LUNGS:  Clear to auscultation bilaterally CHEST:  Unremarkable HEART:  PMI not displaced or sustained,S1 and S2 within normal limits, no S3, no S4, no clicks, no rubs, no murmurs ABD:  Flat, positive bowel sounds normal in frequency in pitch, no bruits, no rebound, no guarding, no midline pulsatile mass, no hepatomegaly, no splenomegaly EXT:  2 plus pulses throughout, no edema, no cyanosis no clubbing, right radial access site without erythema or bruising.     ASSESSMENT AND PLAN  CAD -  He has nonobstructive disease. He will continue with risk reduction. He started taking aspirin 81 mg daily.  ESSENTIAL HYPERTENSION, BENIGN -  He will keep a blood pressure diary to make sure that his blood pressure is not going up  slightly.  PALPITATIONS -   He seems to be doing better and will continue with the current dose of Cardizem pending the blood pressure diary results.  CARDIOMYOPATHY This is very mild. I will follow this clinically.  DYSLIPIDEMIA -  He is doing well on his pravastatin and his LDL was 76 when last checked. He will remain on the meds as listed.

## 2015-08-31 ENCOUNTER — Other Ambulatory Visit: Payer: Self-pay | Admitting: *Deleted

## 2015-09-07 ENCOUNTER — Ambulatory Visit (INDEPENDENT_AMBULATORY_CARE_PROVIDER_SITE_OTHER): Payer: Medicare Other | Admitting: Physician Assistant

## 2015-09-07 ENCOUNTER — Encounter: Payer: Self-pay | Admitting: Physician Assistant

## 2015-09-07 VITALS — BP 138/76 | HR 70 | Temp 97.4°F | Ht 74.0 in | Wt 241.6 lb

## 2015-09-07 DIAGNOSIS — J069 Acute upper respiratory infection, unspecified: Secondary | ICD-10-CM

## 2015-09-07 DIAGNOSIS — M546 Pain in thoracic spine: Secondary | ICD-10-CM

## 2015-09-07 DIAGNOSIS — M549 Dorsalgia, unspecified: Secondary | ICD-10-CM

## 2015-09-07 MED ORDER — AZITHROMYCIN 250 MG PO TABS: ORAL_TABLET | ORAL | Status: DC

## 2015-09-07 MED ORDER — BENZONATATE 100 MG PO CAPS: 100.0000 mg | ORAL_CAPSULE | Freq: Two times a day (BID) | ORAL | Status: DC | PRN

## 2015-09-07 NOTE — Progress Notes (Signed)
Subjective:     Patient ID: Travis Padilla, male   DOB: 07/11/1946, 69 y.o.   MRN: [id number]  HPI Pt with cough and congestion x 1 week Due to the coughing pt with R upper back pain Cough prod of green sputum  Review of Systems  Constitutional: Positive for fatigue. Negative for fever, activity change and appetite change.  HENT: Positive for congestion, postnasal drip, rhinorrhea and sore throat. Negative for ear discharge, ear pain and nosebleeds.   Respiratory: Positive for cough. Negative for choking, shortness of breath and wheezing.   Cardiovascular: Negative.   Musculoskeletal: Positive for back pain.       Objective:   Physical Exam  Constitutional: He appears well-developed and well-nourished.  HENT:  Right Ear: External ear normal.  Left Ear: External ear normal.  Mouth/Throat: Oropharyngeal exudate present.  Neck: Neck supple. No JVD present.  Cardiovascular: Normal rate, regular rhythm and normal heart sounds.   Pulmonary/Chest: Effort normal and breath sounds normal. No respiratory distress. He has no wheezes. He exhibits no tenderness.  Lymphadenopathy:    He has no cervical adenopathy.  Nursing note and vitals reviewed. + TTP just medial to the R scap No palp spasm FROM w/o sx     Assessment:     URI R upper back pain     Plan:     Zpack rx Tessalon '100mg'$  bid prn cough Fluids Rest Heat/Ice for back pain OTC NSAIDS F/U prn

## 2015-09-07 NOTE — Patient Instructions (Signed)
Back Pain, Adult °Back pain is very common in adults. The cause of back pain is rarely dangerous and the pain often gets better over time. The cause of your back pain may not be known. Some common causes of back pain include: °· Strain of the muscles or ligaments supporting the spine. °· Wear and tear (degeneration) of the spinal disks. °· Arthritis. °· Direct injury to the back. °For many people, back pain may return. Since back pain is rarely dangerous, most people can learn to manage this condition on their own. °HOME CARE INSTRUCTIONS °Watch your back pain for any changes. The following actions may help to lessen any discomfort you are feeling: °· Remain active. It is stressful on your back to sit or stand in one place for long periods of time. Do not sit, drive, or stand in one place for more than 30 minutes at a time. Take short walks on even surfaces as soon as you are able. Try to increase the length of time you walk each day. °· Exercise regularly as directed by your health care provider. Exercise helps your back heal faster. It also helps avoid future injury by keeping your muscles strong and flexible. °· Do not stay in bed. Resting more than 1-2 days can delay your recovery. °· Pay attention to your body when you bend and lift. The most comfortable positions are those that put less stress on your recovering back. Always use proper lifting techniques, including: °· Bending your knees. °· Keeping the load close to your body. °· Avoiding twisting. °· Find a comfortable position to sleep. Use a firm mattress and lie on your side with your knees slightly bent. If you lie on your back, put a pillow under your knees. °· Avoid feeling anxious or stressed. Stress increases muscle tension and can worsen back pain. It is important to recognize when you are anxious or stressed and learn ways to manage it, such as with exercise. °· Take medicines only as directed by your health care provider. Over-the-counter  medicines to reduce pain and inflammation are often the most helpful. Your health care provider may prescribe muscle relaxant drugs. These medicines help dull your pain so you can more quickly return to your normal activities and healthy exercise. °· Apply ice to the injured area: °· Put ice in a plastic bag. °· Place a towel between your skin and the bag. °· Leave the ice on for 20 minutes, 2-3 times a day for the first 2-3 days. After that, ice and heat may be alternated to reduce pain and spasms. °· Maintain a healthy weight. Excess weight puts extra stress on your back and makes it difficult to maintain good posture. °SEEK MEDICAL CARE IF: °· You have pain that is not relieved with rest or medicine. °· You have increasing pain going down into the legs or buttocks. °· You have pain that does not improve in one week. °· You have night pain. °· You lose weight. °· You have a fever or chills. °SEEK IMMEDIATE MEDICAL CARE IF:  °· You develop new bowel or bladder control problems. °· You have unusual weakness or numbness in your arms or legs. °· You develop nausea or vomiting. °· You develop abdominal pain. °· You feel faint. °  °This information is not intended to replace advice given to you by your health care provider. Make sure you discuss any questions you have with your health care provider. °  °Document Released: 08/27/2005 Document Revised: 09/17/2014 Document Reviewed: 12/29/2013 °Elsevier Interactive Patient Education ©2016 Elsevier   Inc. Upper Respiratory Infection, Adult Most upper respiratory infections (URIs) are a viral infection of the air passages leading to the lungs. A URI affects the nose, throat, and upper air passages. The most common type of URI is nasopharyngitis and is typically referred to as "the common cold." URIs run their course and usually go away on their own. Most of the time, a URI does not require medical attention, but sometimes a bacterial infection in the upper airways can  follow a viral infection. This is called a secondary infection. Sinus and middle ear infections are common types of secondary upper respiratory infections. Bacterial pneumonia can also complicate a URI. A URI can worsen asthma and chronic obstructive pulmonary disease (COPD). Sometimes, these complications can require emergency medical care and may be life threatening.  CAUSES Almost all URIs are caused by viruses. A virus is a type of germ and can spread from one person to another.  RISKS FACTORS You may be at risk for a URI if:   You smoke.   You have chronic heart or lung disease.  You have a weakened defense (immune) system.   You are very young or very old.   You have nasal allergies or asthma.  You work in crowded or poorly ventilated areas.  You work in health care facilities or schools. SIGNS AND SYMPTOMS  Symptoms typically develop 2-3 days after you come in contact with a cold virus. Most viral URIs last 7-10 days. However, viral URIs from the influenza virus (flu virus) can last 14-18 days and are typically more severe. Symptoms may include:   Runny or stuffy (congested) nose.   Sneezing.   Cough.   Sore throat.   Headache.   Fatigue.   Fever.   Loss of appetite.   Pain in your forehead, behind your eyes, and over your cheekbones (sinus pain).  Muscle aches.  DIAGNOSIS  Your health care provider may diagnose a URI by:  Physical exam.  Tests to check that your symptoms are not due to another condition such as:  Strep throat.  Sinusitis.  Pneumonia.  Asthma. TREATMENT  A URI goes away on its own with time. It cannot be cured with medicines, but medicines may be prescribed or recommended to relieve symptoms. Medicines may help:  Reduce your fever.  Reduce your cough.  Relieve nasal congestion. HOME CARE INSTRUCTIONS   Take medicines only as directed by your health care provider.   Gargle warm saltwater or take cough drops to  comfort your throat as directed by your health care provider.  Use a warm mist humidifier or inhale steam from a shower to increase air moisture. This may make it easier to breathe.  Drink enough fluid to keep your urine clear or pale yellow.   Eat soups and other clear broths and maintain good nutrition.   Rest as needed.   Return to work when your temperature has returned to normal or as your health care provider advises. You may need to stay home longer to avoid infecting others. You can also use a face mask and careful hand washing to prevent spread of the virus.  Increase the usage of your inhaler if you have asthma.   Do not use any tobacco products, including cigarettes, chewing tobacco, or electronic cigarettes. If you need help quitting, ask your health care provider. PREVENTION  The best way to protect yourself from getting a cold is to practice good hygiene.   Avoid oral or hand contact with people  with cold symptoms.   Wash your hands often if contact occurs.  There is no clear evidence that vitamin C, vitamin E, echinacea, or exercise reduces the chance of developing a cold. However, it is always recommended to get plenty of rest, exercise, and practice good nutrition.  SEEK MEDICAL CARE IF:   You are getting worse rather than better.   Your symptoms are not controlled by medicine.   You have chills.  You have worsening shortness of breath.  You have brown or red mucus.  You have yellow or brown nasal discharge.  You have pain in your face, especially when you bend forward.  You have a fever.  You have swollen neck glands.  You have pain while swallowing.  You have white areas in the back of your throat. SEEK IMMEDIATE MEDICAL CARE IF:   You have severe or persistent:  Headache.  Ear pain.  Sinus pain.  Chest pain.  You have chronic lung disease and any of the following:  Wheezing.  Prolonged cough.  Coughing up blood.  A change in  your usual mucus.  You have a stiff neck.  You have changes in your:  Vision.  Hearing.  Thinking.  Mood. MAKE SURE YOU:   Understand these instructions.  Will watch your condition.  Will get help right away if you are not doing well or get worse.   This information is not intended to replace advice given to you by your health care provider. Make sure you discuss any questions you have with your health care provider.   Document Released: 02/20/2001 Document Revised: 01/11/2015 Document Reviewed: 12/02/2013 Elsevier Interactive Patient Education [employer].

## 2015-09-27 ENCOUNTER — Other Ambulatory Visit: Payer: Self-pay

## 2015-09-27 MED ORDER — SERTRALINE HCL 50 MG PO TABS: 50.0000 mg | ORAL_TABLET | Freq: Every day | ORAL | Status: DC

## 2015-09-27 NOTE — Telephone Encounter (Signed)
Last seen 09/07/15 [facility]   Before that 05/09/15  Dr Wendi Snipes  This med not on EPIC list  Faxed over from [facility] for Refill  90 day supply

## 2015-10-06 ENCOUNTER — Telehealth: Payer: Self-pay | Admitting: Family Medicine

## 2015-10-31 ENCOUNTER — Ambulatory Visit (INDEPENDENT_AMBULATORY_CARE_PROVIDER_SITE_OTHER): Payer: Medicare Other | Admitting: Family Medicine

## 2015-10-31 ENCOUNTER — Encounter: Payer: Self-pay | Admitting: Family Medicine

## 2015-10-31 VITALS — Ht 74.0 in

## 2015-10-31 DIAGNOSIS — Z1212 Encounter for screening for malignant neoplasm of rectum: Secondary | ICD-10-CM

## 2015-10-31 DIAGNOSIS — E785 Hyperlipidemia, unspecified: Secondary | ICD-10-CM

## 2015-10-31 DIAGNOSIS — M542 Cervicalgia: Secondary | ICD-10-CM

## 2015-10-31 DIAGNOSIS — E559 Vitamin D deficiency, unspecified: Secondary | ICD-10-CM

## 2015-10-31 DIAGNOSIS — E669 Obesity, unspecified: Secondary | ICD-10-CM | POA: Diagnosis not present

## 2015-10-31 DIAGNOSIS — R202 Paresthesia of skin: Secondary | ICD-10-CM | POA: Diagnosis not present

## 2015-10-31 DIAGNOSIS — H9202 Otalgia, left ear: Secondary | ICD-10-CM | POA: Diagnosis not present

## 2015-10-31 DIAGNOSIS — R002 Palpitations: Secondary | ICD-10-CM

## 2015-10-31 DIAGNOSIS — G4700 Insomnia, unspecified: Secondary | ICD-10-CM

## 2015-10-31 DIAGNOSIS — E790 Hyperuricemia without signs of inflammatory arthritis and tophaceous disease: Secondary | ICD-10-CM

## 2015-10-31 DIAGNOSIS — D485 Neoplasm of uncertain behavior of skin: Secondary | ICD-10-CM | POA: Diagnosis not present

## 2015-10-31 DIAGNOSIS — Z125 Encounter for screening for malignant neoplasm of prostate: Secondary | ICD-10-CM

## 2015-10-31 DIAGNOSIS — D519 Vitamin B12 deficiency anemia, unspecified: Secondary | ICD-10-CM | POA: Diagnosis not present

## 2015-10-31 DIAGNOSIS — I10 Essential (primary) hypertension: Secondary | ICD-10-CM | POA: Diagnosis not present

## 2015-10-31 MED ORDER — AMITRIPTYLINE HCL 25 MG PO TABS: 25.0000 mg | ORAL_TABLET | Freq: Every day | ORAL | Status: DC

## 2015-10-31 NOTE — Addendum Note (Signed)
Addended by: Pollyann Kennedy F on: 10/31/2015 12:14 PM   Modules accepted: Orders, SmartSet

## 2015-10-31 NOTE — Progress Notes (Signed)
Subjective:  Patient ID: Travis Padilla, male    DOB: 06-Jun-1946  Age: 70 y.o. MRN: [id number]  CC: Annual Exam   HPI [name] presents for anemia, left arm numbness, worse lately with pain at anterior left shoulder. Pt. Has C3-4 pinched nerve. Pain mgmt with Dr. Nelva Bush. Irreg. Heart beat treated by switch to diltiazem from amlodipine.   History Homar has a past medical history of Hypertension; Gout; Fungus infection; Hemorrhoid; Dizziness; Chest pain; Abnormal nuclear stress test; CAD (coronary artery disease); Hyperlipidemia; Skin cancer; Anemia; and Spinal stenosis.   He has past surgical history that includes Knee cartilage surgery (Left); Cataract extraction w/ intraocular lens  implant, bilateral; Blepharoptosis repair (Bilateral); Colonoscopy (N/A, 11/03/2014); and Skin cancer excision (12/2014, 04/25/15).   His family history includes Heart attack in his father; Heart failure in his mother. There is no history of Colon cancer.He reports that he has quit smoking. His smoking use included Cigarettes. He started smoking about 47 years ago. He has a .4 pack-year smoking history. He has never used smokeless tobacco. He reports that he does not drink alcohol or use illicit drugs.    ROS Review of Systems  Constitutional: Negative for fever, chills, diaphoresis, activity change, appetite change, fatigue and unexpected weight change.  HENT: Negative for congestion, ear pain, hearing loss, postnasal drip, rhinorrhea, sore throat, tinnitus and trouble swallowing.   Eyes: Negative for photophobia, pain, discharge and redness.  Respiratory: Negative for apnea, cough, choking, chest tightness, shortness of breath, wheezing and stridor.   Cardiovascular: Positive for palpitations (bettter with med change. Worse while taking trazodone). Negative for chest pain and leg swelling.  Gastrointestinal: Negative for nausea, vomiting, abdominal pain, diarrhea, constipation, blood in stool and abdominal  distention.  Endocrine: Negative for cold intolerance, heat intolerance, polydipsia, polyphagia and polyuria.  Genitourinary: Negative for dysuria, urgency, frequency, hematuria, flank pain, enuresis, difficulty urinating and genital sores.  Musculoskeletal: Positive for arthralgias (left shoulder) and neck pain. Negative for joint swelling.  Skin: Negative for color change, rash and wound.  Allergic/Immunologic: Negative for immunocompromised state.  Neurological: Negative for dizziness, tremors, seizures, syncope, facial asymmetry, speech difficulty, weakness, light-headedness, numbness and headaches.  Hematological: Does not bruise/bleed easily.  Psychiatric/Behavioral: Positive for sleep disturbance (awakening at 3 AM ). Negative for suicidal ideas, hallucinations, behavioral problems, confusion, dysphoric mood, decreased concentration and agitation. The patient is not nervous/anxious and is not hyperactive.     Objective:  Ht 6' 2" (1.88 m)  BP Readings from Last 3 Encounters:  09/07/15 138/76  08/30/15 136/74  07/21/15 132/78    Wt Readings from Last 3 Encounters:  09/07/15 241 lb 9.6 oz (109.589 kg)  08/30/15 241 lb (109.317 kg)  07/21/15 239 lb 1.6 oz (108.455 kg)     Physical Exam  Constitutional: He is oriented to person, place, and time. He appears well-developed and well-nourished.  HENT:  Head: Normocephalic and atraumatic.  Mouth/Throat: Oropharynx is clear and moist.  Eyes: EOM are normal. Pupils are equal, round, and reactive to light.  Neck: Normal range of motion. No tracheal deviation present. No thyromegaly present.  Cardiovascular: Normal rate, regular rhythm and normal heart sounds.  Exam reveals no gallop and no friction rub.   No murmur heard. Pulmonary/Chest: Breath sounds normal. He has no wheezes. He has no rales.  Abdominal: Soft. He exhibits no mass. There is no tenderness.  Genitourinary: Rectum normal, prostate normal and penis normal. No penile  tenderness.  Musculoskeletal: Normal range of motion. He  exhibits no edema.  Neurological: He is alert and oriented to person, place, and time. He has normal reflexes. No cranial nerve deficit. He exhibits normal muscle tone.  Skin: Skin is warm and dry.  4 mm lesion at jugular notch. Red with yellow scale.   Psychiatric: He has a normal mood and affect.     Lab Results  Component Value Date   WBC 8.6 04/18/2015   HGB 13.2* 03/30/2015   HCT 39.4 04/18/2015   PLT 226 04/18/2015   GLUCOSE 107* 04/18/2015   CHOL 138 08/04/2014   TRIG 105 08/04/2014   HDL 41 08/04/2014   LDLCALC 76 08/04/2014   ALT 12 04/18/2015   AST 17 04/18/2015   NA 137 04/18/2015   K 5.2 04/18/2015   CL 100 04/18/2015   CREATININE 1.22 04/18/2015   BUN 18 04/18/2015   CO2 21 04/18/2015   TSH 5.336* 07/21/2015   PSA 2.2 08/04/2014   INR 1.1* 08/21/2012    Dg Abd 1 View  05/02/2015  CLINICAL DATA:  Pre lithotripsy, right-sided stone per patient. EXAM: ABDOMEN - 1 VIEW COMPARISON:  CT 04/18/2015 FINDINGS: Bowel gas pattern is nonobstructive. There are no abnormal calcifications projected over the kidneys. There are multiple calcific densities over the right pelvis most of which likely represent phleboliths. Remainder of the exam is unremarkable. IMPRESSION: Nonobstructive bowel gas pattern. No definite renal stones present. Electronically Signed   By: Marin Olp M.D.   On: 05/02/2015 10:46    Assessment & Plan:   Larron was seen today for annual exam.  Diagnoses and all orders for this visit:  Ear pain, left -     CBC with Differential/Platelet  Essential hypertension, benign -     CMP14+EGFR -     CBC with Differential/Platelet -     POCT urinalysis dipstick  Paresthesia of left upper extremity -     CBC with Differential/Platelet  Cervicalgia -     CBC with Differential/Platelet  Insomnia -     CMP14+EGFR -     CBC with Differential/Platelet  Hyperuricemia -     CMP14+EGFR -     CBC  with Differential/Platelet -     POCT urinalysis dipstick -     Uric acid  Obesity, unspecified -     CMP14+EGFR -     CBC with Differential/Platelet -     POCT urinalysis dipstick  Anemia due to vitamin B12 deficiency -     Vitamin B12 -     CMP14+EGFR -     CBC with Differential/Platelet  Palpitations -     CMP14+EGFR -     CBC with Differential/Platelet  Neoplasm of uncertain behavior of skin -     CBC with Differential/Platelet  Hyperlipidemia -     Lipid panel -     POCT urinalysis dipstick  Screening for prostate cancer -     PSA Total (Reflex To Free)  Vitamin D deficiency -     VITAMIN D 25 Hydroxy (Vit-D Deficiency, Fractures)  Other orders -     amitriptyline (ELAVIL) 25 MG tablet; Take 1 tablet (25 mg total) by mouth at bedtime. For sleep    Neuropathic pain interfering with strength, sleep and well-being. Pt. Will try amitryptilline since he could not tolerate ambien or trazodone.   I have discontinued Mr. Depass azithromycin, benzonatate, and sertraline. I am also having him start on amitriptyline. Additionally, I am having him maintain his Iron-Vit C-Vit B12-Folic Acid (IRON 546 PLUS  PO), aspirin, pravastatin, carvedilol, Folic Acid-Vit B3-QSU I66, OVER THE COUNTER MEDICATION, diltiazem, and Multiple Vitamin (MULTI-VITAMIN PO).  Meds ordered this encounter  Medications  . amitriptyline (ELAVIL) 25 MG tablet    Sig: Take 1 tablet (25 mg total) by mouth at bedtime. For sleep    Dispense:  30 tablet    Refill:  5   Skin lesion - shave bx encouraged soon.  Follow-up: Return in about 6 months (around 04/29/2016).  Claretta Fraise, M.D.

## 2015-10-31 NOTE — Patient Instructions (Signed)
Follow up soon for skin biopsy at base of neck

## 2015-10-31 NOTE — Addendum Note (Signed)
Addended by: Marin Olp on: 10/31/2015 12:14 PM   Modules accepted: Miquel Dunn

## 2015-11-01 LAB — PSA TOTAL (REFLEX TO FREE): PROSTATE SPECIFIC AG, SERUM: 2.4 ng/mL (ref 0.0–4.0)

## 2015-11-01 LAB — CBC WITH DIFFERENTIAL/PLATELET
BASOS: 0 %
Basophils Absolute: 0 10*3/uL (ref 0.0–0.2)
EOS (ABSOLUTE): 0.1 10*3/uL (ref 0.0–0.4)
EOS: 2 %
HEMATOCRIT: 36.6 % — AB (ref 37.5–51.0)
Hemoglobin: 12.7 g/dL (ref 12.6–17.7)
IMMATURE GRANULOCYTES: 0 %
Immature Grans (Abs): 0 10*3/uL (ref 0.0–0.1)
LYMPHS ABS: 1.4 10*3/uL (ref 0.7–3.1)
Lymphs: 27 %
MCH: 31.7 pg (ref 26.6–33.0)
MCHC: 34.7 g/dL (ref 31.5–35.7)
MCV: 91 fL (ref 79–97)
MONOS ABS: 0.6 10*3/uL (ref 0.1–0.9)
Monocytes: 11 %
NEUTROS ABS: 3 10*3/uL (ref 1.4–7.0)
NEUTROS PCT: 60 %
Platelets: 169 10*3/uL (ref 150–379)
RBC: 4.01 x10E6/uL — ABNORMAL LOW (ref 4.14–5.80)
RDW: 13.4 % (ref 12.3–15.4)
WBC: 5.1 10*3/uL (ref 3.4–10.8)

## 2015-11-01 LAB — CMP14+EGFR
ALBUMIN: 4 g/dL (ref 3.6–4.8)
ALT: 19 IU/L (ref 0–44)
AST: 20 IU/L (ref 0–40)
Albumin/Globulin Ratio: 1.4 (ref 1.1–2.5)
Alkaline Phosphatase: 66 IU/L (ref 39–117)
BUN / CREAT RATIO: 20 (ref 10–22)
BUN: 19 mg/dL (ref 8–27)
Bilirubin Total: 0.6 mg/dL (ref 0.0–1.2)
CALCIUM: 9 mg/dL (ref 8.6–10.2)
CO2: 24 mmol/L (ref 18–29)
CREATININE: 0.96 mg/dL (ref 0.76–1.27)
Chloride: 109 mmol/L — ABNORMAL HIGH (ref 96–106)
GFR, EST AFRICAN AMERICAN: 94 mL/min/{1.73_m2} (ref >59)
GFR, EST NON AFRICAN AMERICAN: 81 mL/min/{1.73_m2} (ref >59)
GLOBULIN, TOTAL: 2.9 g/dL (ref 1.5–4.5)
Glucose: 86 mg/dL (ref 65–99)
Potassium: 4.7 mmol/L (ref 3.5–5.2)
SODIUM: 148 mmol/L — AB (ref 134–144)
TOTAL PROTEIN: 6.9 g/dL (ref 6.0–8.5)

## 2015-11-01 LAB — LIPID PANEL
CHOL/HDL RATIO: 2.6 ratio (ref 0.0–5.0)
Cholesterol, Total: 105 mg/dL (ref 100–199)
HDL: 41 mg/dL (ref >39)
LDL Calculated: 55 mg/dL (ref 0–99)
Triglycerides: 46 mg/dL (ref 0–149)
VLDL Cholesterol Cal: 9 mg/dL (ref 5–40)

## 2015-11-01 LAB — VITAMIN B12: Vitamin B-12: 702 pg/mL (ref 211–946)

## 2015-11-01 LAB — VITAMIN D 25 HYDROXY (VIT D DEFICIENCY, FRACTURES): Vit D, 25-Hydroxy: 29.9 ng/mL — ABNORMAL LOW (ref 30.0–100.0)

## 2015-11-01 LAB — URIC ACID: URIC ACID: 6.9 mg/dL (ref 3.7–8.6)

## 2015-11-02 ENCOUNTER — Encounter: Payer: Self-pay | Admitting: Family Medicine

## 2015-11-03 ENCOUNTER — Other Ambulatory Visit: Payer: Self-pay | Admitting: *Deleted

## 2015-11-03 LAB — FECAL OCCULT BLOOD, IMMUNOCHEMICAL: FECAL OCCULT BLD: NEGATIVE

## 2015-11-03 MED ORDER — VITAMIN D (ERGOCALCIFEROL) 1.25 MG (50000 UNIT) PO CAPS
50000.0000 [IU] | ORAL_CAPSULE | ORAL | Status: DC
Start: 2015-11-03 — End: 2016-04-30

## 2015-11-16 DIAGNOSIS — L814 Other melanin hyperpigmentation: Secondary | ICD-10-CM | POA: Diagnosis not present

## 2015-11-16 DIAGNOSIS — L821 Other seborrheic keratosis: Secondary | ICD-10-CM | POA: Diagnosis not present

## 2015-11-16 DIAGNOSIS — Z85820 Personal history of malignant melanoma of skin: Secondary | ICD-10-CM | POA: Diagnosis not present

## 2015-11-16 DIAGNOSIS — Z85828 Personal history of other malignant neoplasm of skin: Secondary | ICD-10-CM | POA: Diagnosis not present

## 2015-11-16 DIAGNOSIS — D045 Carcinoma in situ of skin of trunk: Secondary | ICD-10-CM | POA: Diagnosis not present

## 2015-11-16 DIAGNOSIS — D225 Melanocytic nevi of trunk: Secondary | ICD-10-CM | POA: Diagnosis not present

## 2015-11-26 ENCOUNTER — Other Ambulatory Visit: Payer: Self-pay | Admitting: Cardiology

## 2015-11-28 NOTE — Telephone Encounter (Signed)
Rx(s) sent to pharmacy electronically.  

## 2015-11-30 ENCOUNTER — Other Ambulatory Visit: Payer: Self-pay

## 2015-11-30 MED ORDER — AMITRIPTYLINE HCL 25 MG PO TABS: 25.0000 mg | ORAL_TABLET | Freq: Every day | ORAL | Status: DC

## 2015-12-02 ENCOUNTER — Ambulatory Visit: Payer: Medicare Other | Admitting: Family Medicine

## 2015-12-09 ENCOUNTER — Encounter: Payer: Self-pay | Admitting: Family Medicine

## 2016-01-14 DIAGNOSIS — J029 Acute pharyngitis, unspecified: Secondary | ICD-10-CM | POA: Diagnosis not present

## 2016-01-31 ENCOUNTER — Encounter: Payer: Self-pay | Admitting: Family Medicine

## 2016-01-31 ENCOUNTER — Ambulatory Visit (INDEPENDENT_AMBULATORY_CARE_PROVIDER_SITE_OTHER): Payer: Medicare Other | Admitting: Family Medicine

## 2016-01-31 VITALS — BP 139/79 | HR 70 | Temp 97.5°F | Ht 74.0 in | Wt 246.0 lb

## 2016-01-31 DIAGNOSIS — E785 Hyperlipidemia, unspecified: Secondary | ICD-10-CM

## 2016-01-31 DIAGNOSIS — I10 Essential (primary) hypertension: Secondary | ICD-10-CM | POA: Diagnosis not present

## 2016-01-31 DIAGNOSIS — E7211 Homocystinuria: Secondary | ICD-10-CM

## 2016-01-31 DIAGNOSIS — R7983 Abnormal findings of blood amino-acid level: Secondary | ICD-10-CM

## 2016-01-31 DIAGNOSIS — E559 Vitamin D deficiency, unspecified: Secondary | ICD-10-CM | POA: Diagnosis not present

## 2016-01-31 DIAGNOSIS — D519 Vitamin B12 deficiency anemia, unspecified: Secondary | ICD-10-CM | POA: Diagnosis not present

## 2016-01-31 DIAGNOSIS — S93401A Sprain of unspecified ligament of right ankle, initial encounter: Secondary | ICD-10-CM

## 2016-01-31 DIAGNOSIS — E7219 Other disorders of sulfur-bearing amino-acid metabolism: Secondary | ICD-10-CM

## 2016-01-31 DIAGNOSIS — J329 Chronic sinusitis, unspecified: Secondary | ICD-10-CM

## 2016-01-31 DIAGNOSIS — J40 Bronchitis, not specified as acute or chronic: Secondary | ICD-10-CM | POA: Diagnosis not present

## 2016-01-31 MED ORDER — AMOXICILLIN-POT CLAVULANATE 875-125 MG PO TABS: 1.0000 | ORAL_TABLET | Freq: Two times a day (BID) | ORAL | Status: DC

## 2016-01-31 NOTE — Progress Notes (Signed)
Subjective:  Patient ID: Travis Padilla, male    DOB: 07/08/1946  Age: 69 y.o. MRN: [id number]  CC: URI and Ankle Pain   HPI Travis Padilla presents for Patient presents with upper respiratory congestion. Rhinorrhea that is frequently purulent. There is moderate sore throat. Patient reports coughing frequently as well.-colored/purulent sputum noted. There is no fever no chills no sweats. The patient denies being short of breath. Onset was 3-5 days ago. Gradually got better, but recurred after finishing an antibiotic prescribed at Urgent Care.   New exercise in yoga 2 weeks ago. After, ankle turned back & blue. Got better. Wore a brace until 2 days ago. Iced it regularly.Pain at right medial malleollus.  Pt. Also concerned for his blood - He had elevated homocysteine and now using Folbee. Also high cholesterol, now on pravastatin. BP controlled with carvedilol and cardizem.     History Travis Padilla has a past medical history of Hypertension; Gout; Fungus infection; Hemorrhoid; Dizziness; Chest pain; Abnormal nuclear stress test; CAD (coronary artery disease); Hyperlipidemia; Skin cancer; Anemia; and Spinal stenosis.   He has past surgical history that includes Knee cartilage surgery (Left); Cataract extraction w/ intraocular lens  implant, bilateral; Blepharoptosis repair (Bilateral); Colonoscopy (N/A, 11/03/2014); and Skin cancer excision (12/2014, 04/25/15).   His family history includes Heart attack in his father; Heart failure in his mother. There is no history of Colon cancer.He reports that he has quit smoking. His smoking use included Cigarettes. He started smoking about 47 years ago. He has a .4 pack-year smoking history. He has never used smokeless tobacco. He reports that he does not drink alcohol or use illicit drugs.    ROS Review of Systems  Constitutional: Negative for fever, chills and diaphoresis.  HENT: Positive for congestion, ear pain, postnasal drip, sinus pressure and sore  throat. Negative for rhinorrhea.   Respiratory: Positive for cough. Negative for shortness of breath.   Cardiovascular: Negative for chest pain.  Gastrointestinal: Negative for abdominal pain.  Musculoskeletal: Positive for myalgias (arch of right foot) and arthralgias.  Skin: Negative for rash.  Neurological: Negative for weakness and headaches.    Objective:  BP 139/79 mmHg  Pulse 70  Temp(Src) 97.5 F (36.4 C) (Oral)  Ht '6\' 2"'  (1.88 m)  Wt 246 lb (111.585 kg)  BMI 31.57 kg/m2  SpO2 97%  BP Readings from Last 3 Encounters:  01/31/16 139/79  09/07/15 138/76  08/30/15 136/74    Wt Readings from Last 3 Encounters:  01/31/16 246 lb (111.585 kg)  09/07/15 241 lb 9.6 oz (109.589 kg)  08/30/15 241 lb (109.317 kg)     Physical Exam  Constitutional: He appears well-developed and well-nourished.  HENT:  Head: Normocephalic and atraumatic.  Right Ear: Tympanic membrane and external ear normal. No decreased hearing is noted.  Left Ear: Tympanic membrane and external ear normal. No decreased hearing is noted.  Nose: Mucosal edema present. Right sinus exhibits no frontal sinus tenderness. Left sinus exhibits no frontal sinus tenderness.  Mouth/Throat: No oropharyngeal exudate or posterior oropharyngeal erythema.  Eyes: Pupils are equal, round, and reactive to light.  Neck: Normal range of motion. Neck supple. No Brudzinski's sign noted.  Cardiovascular: Normal rate and regular rhythm.   No murmur heard. Pulmonary/Chest: Breath sounds normal. No respiratory distress.  Abdominal: Soft. Bowel sounds are normal. He exhibits no mass. There is no tenderness.  Musculoskeletal: Normal range of motion. He exhibits tenderness (right medial malleolus and instep of R arch, right foot). He exhibits no  edema.  Lymphadenopathy:       Head (right side): No preauricular adenopathy present.       Head (left side): No preauricular adenopathy present.       Right cervical: No superficial cervical  adenopathy present.      Left cervical: No superficial cervical adenopathy present.  Vitals reviewed.    Lab Results  Component Value Date   WBC 5.1 10/31/2015   HGB 13.2* 03/30/2015   HCT 36.6* 10/31/2015   PLT 169 10/31/2015   GLUCOSE 86 10/31/2015   CHOL 105 10/31/2015   TRIG 46 10/31/2015   HDL 41 10/31/2015   LDLCALC 55 10/31/2015   ALT 19 10/31/2015   AST 20 10/31/2015   NA 148* 10/31/2015   K 4.7 10/31/2015   CL 109* 10/31/2015   CREATININE 0.96 10/31/2015   BUN 19 10/31/2015   CO2 24 10/31/2015   TSH 5.336* 07/21/2015   PSA 2.2 08/04/2014   INR 1.1* 08/21/2012    Dg Abd 1 View  05/02/2015  CLINICAL DATA:  Pre lithotripsy, right-sided stone per patient. EXAM: ABDOMEN - 1 VIEW COMPARISON:  CT 04/18/2015 FINDINGS: Bowel gas pattern is nonobstructive. There are no abnormal calcifications projected over the kidneys. There are multiple calcific densities over the right pelvis most of which likely represent phleboliths. Remainder of the exam is unremarkable. IMPRESSION: Nonobstructive bowel gas pattern. No definite renal stones present. Electronically Signed   By: Marin Olp M.D.   On: 05/02/2015 10:46    Assessment & Plan:   Travis Padilla was seen today for uri and ankle pain.  Diagnoses and all orders for this visit:  Sinobronchitis -     CMP14+EGFR  Anemia due to vitamin B12 deficiency -     Vitamin B12 -     CBC with Differential/Platelet -     CMP14+EGFR  Essential hypertension, benign -     CMP14+EGFR  Hyperlipidemia -     CMP14+EGFR -     Lipid panel  Sprain of ankle, right, initial encounter -     CMP14+EGFR  Vitamin D deficiency -     VITAMIN D 25 Hydroxy (Vit-D Deficiency, Fractures)  Homocysteinemia (HCC) -     Homocysteine  Other orders -     amoxicillin-clavulanate (AUGMENTIN) 875-125 MG tablet; Take 1 tablet by mouth 2 (two) times daily. Take all of this medication    I am having Travis Padilla start on amoxicillin-clavulanate. I am also  having him maintain his Iron-Vit C-Vit B12-Folic Acid (IRON 010 PLUS PO), aspirin, carvedilol, Folic Acid-Vit O7-HQR F75, OVER THE COUNTER MEDICATION, diltiazem, Multiple Vitamin (MULTI-VITAMIN PO), Vitamin D (Ergocalciferol), pravastatin, and amitriptyline.  Meds ordered this encounter  Medications  . amoxicillin-clavulanate (AUGMENTIN) 875-125 MG tablet    Sig: Take 1 tablet by mouth 2 (two) times daily. Take all of this medication    Dispense:  28 tablet    Refill:  1     Follow-up: Return in about 6 months (around 08/02/2016).  Claretta Fraise, M.D.

## 2016-02-01 LAB — CBC WITH DIFFERENTIAL/PLATELET
BASOS: 0 %
Basophils Absolute: 0 10*3/uL (ref 0.0–0.2)
EOS (ABSOLUTE): 0.2 10*3/uL (ref 0.0–0.4)
Eos: 2 %
HEMOGLOBIN: 12.3 g/dL — AB (ref 12.6–17.7)
Hematocrit: 37.6 % (ref 37.5–51.0)
IMMATURE GRANS (ABS): 0 10*3/uL (ref 0.0–0.1)
Immature Granulocytes: 0 %
LYMPHS: 27 %
Lymphocytes Absolute: 1.9 10*3/uL (ref 0.7–3.1)
MCH: 29.2 pg (ref 26.6–33.0)
MCHC: 32.7 g/dL (ref 31.5–35.7)
MCV: 89 fL (ref 79–97)
MONOCYTES: 11 %
Monocytes Absolute: 0.8 10*3/uL (ref 0.1–0.9)
NEUTROS ABS: 4.3 10*3/uL (ref 1.4–7.0)
Neutrophils: 60 %
Platelets: 247 10*3/uL (ref 150–379)
RBC: 4.21 x10E6/uL (ref 4.14–5.80)
RDW: 13.6 % (ref 12.3–15.4)
WBC: 7.2 10*3/uL (ref 3.4–10.8)

## 2016-02-01 LAB — CMP14+EGFR
A/G RATIO: 1.2 (ref 1.2–2.2)
ALBUMIN: 4.1 g/dL (ref 3.6–4.8)
ALT: 19 IU/L (ref 0–44)
AST: 17 IU/L (ref 0–40)
Alkaline Phosphatase: 78 IU/L (ref 39–117)
BILIRUBIN TOTAL: 0.3 mg/dL (ref 0.0–1.2)
BUN / CREAT RATIO: 16 (ref 10–24)
BUN: 15 mg/dL (ref 8–27)
CALCIUM: 9 mg/dL (ref 8.6–10.2)
CHLORIDE: 104 mmol/L (ref 96–106)
CO2: 23 mmol/L (ref 18–29)
Creatinine, Ser: 0.94 mg/dL (ref 0.76–1.27)
GFR, EST AFRICAN AMERICAN: 96 mL/min/{1.73_m2} (ref >59)
GFR, EST NON AFRICAN AMERICAN: 83 mL/min/{1.73_m2} (ref >59)
GLUCOSE: 86 mg/dL (ref 65–99)
Globulin, Total: 3.4 g/dL (ref 1.5–4.5)
Potassium: 4.7 mmol/L (ref 3.5–5.2)
Sodium: 142 mmol/L (ref 134–144)
TOTAL PROTEIN: 7.5 g/dL (ref 6.0–8.5)

## 2016-02-01 LAB — LIPID PANEL
CHOL/HDL RATIO: 4.4 ratio (ref 0.0–5.0)
Cholesterol, Total: 131 mg/dL (ref 100–199)
HDL: 30 mg/dL — ABNORMAL LOW (ref >39)
LDL CALC: 52 mg/dL (ref 0–99)
Triglycerides: 247 mg/dL — ABNORMAL HIGH (ref 0–149)
VLDL CHOLESTEROL CAL: 49 mg/dL — AB (ref 5–40)

## 2016-02-01 LAB — VITAMIN D 25 HYDROXY (VIT D DEFICIENCY, FRACTURES): VIT D 25 HYDROXY: 39.7 ng/mL (ref 30.0–100.0)

## 2016-02-01 LAB — HOMOCYSTEINE: Homocysteine: 8.1 umol/L (ref 0.0–15.0)

## 2016-02-01 LAB — VITAMIN B12: VITAMIN B 12: 1114 pg/mL — AB (ref 211–946)

## 2016-04-01 NOTE — Progress Notes (Signed)
HPI The patient presents for followup of palpitations.   Since I last saw him he has done well.  He has rare palpitations.  The patient denies any new symptoms such as chest discomfort, neck or arm discomfort. There has been no new shortness of breath, PND or orthopnea. There has been no reported  presyncope or syncope.  His biggest issues is lack of sleep.     Allergies  Allergen Reactions  . Trazodone And Nefazodone Palpitations    Current Outpatient Prescriptions  Medication Sig Dispense Refill  . amitriptyline (ELAVIL) 25 MG tablet Take 1 tablet (25 mg total) by mouth at bedtime. For sleep 90 tablet 0  . aspirin 81 MG tablet Take 81 mg by mouth daily.    . carvedilol (COREG) 6.25 MG tablet Take 1 and 1/2 tablets by  mouth two times daily with  a meal 270 tablet 3  . diltiazem (CARDIZEM CD) 120 MG 24 hr capsule Take 1 capsule (120 mg total) by mouth daily. 90 capsule 3  . Folic Acid-Vit I2-LNL G92 (FOLBEE) 2.5-25-1 MG TABS tablet Take 1 tablet by mouth daily. 90 tablet 3  . Iron-Vit C-Vit B12-Folic Acid (IRON 119 PLUS PO) Take 1 tablet by mouth every morning.     . Multiple Vitamin (MULTI-VITAMIN PO) Take by mouth.    Marland Kitchen OVER THE COUNTER MEDICATION Place 1 drop into both eyes 2 (two) times daily as needed (dry eyes.).    Marland Kitchen pravastatin (PRAVACHOL) 40 MG tablet Take 1 tablet (40 mg total) by mouth daily. 90 tablet 3  . Vitamin D, Ergocalciferol, (DRISDOL) 50000 units CAPS capsule Take 1 capsule (50,000 Units total) by mouth 2 (two) times a week. 16 capsule 0   No current facility-administered medications for this visit.     Past Medical History:  Diagnosis Date  . Abnormal nuclear stress test    December, 2013  . Anemia   . CAD (coronary artery disease)    Mild nonobstructive plaque in cath 2013  . Chest pain    December, 2013  . Dizziness   . Fungus infection    in ear   . Gout   . Hemorrhoid   . Hyperlipidemia   . Hypertension   . Skin cancer   . Spinal stenosis      Past Surgical History:  Procedure Laterality Date  . BELPHAROPTOSIS REPAIR Bilateral   . CATARACT EXTRACTION W/ INTRAOCULAR LENS  IMPLANT, BILATERAL    . COLONOSCOPY N/A 11/03/2014   Procedure: COLONOSCOPY;  Surgeon: Rogene Houston, MD;  Location: [facility];  Service: Endoscopy;  Laterality: N/A;  1225  . KNEE CARTILAGE SURGERY Left    Left knee  . SKIN CANCER EXCISION  12/2014, 04/25/15    ROS:  Neck and arm pain secondary to spinal stenosis.  Otherwise as stated in the HPI and negative for all other systems  PHYSICAL EXAM BP 110/80 (BP Location: Right Arm, Patient Position: Sitting)   Pulse 60   Ht '6\' 2"'$  (1.88 m)   Wt 238 lb (108 kg)   BMI 30.56 kg/m  GENERAL:  Well appearing NECK:  No jugular venous distention, waveform within normal limits, carotid upstroke brisk and symmetric, no bruits, no thyromegaly LUNGS:  Clear to auscultation bilaterally CHEST:  Unremarkable HEART:  PMI not displaced or sustained,S1 and S2 within normal limits, no S3, no S4, no clicks, no rubs, no murmurs ABD:  Flat, positive bowel sounds normal in frequency in pitch, no bruits, no rebound, no  guarding, no midline pulsatile mass, no hepatomegaly, no splenomegaly EXT:  2 plus pulses throughout, no edema, no cyanosis no clubbing.   EKG:  NSR, leftward axis.  Intervals within normal limits, no acute ST-T wave changes. 04/04/2016  ASSESSMENT AND PLAN  CAD -  He has nonobstructive disease. He will continue with risk reduction. He started taking aspirin 81 mg daily.  ESSENTIAL HYPERTENSION, BENIGN -  The blood pressure is at target. No change in medications is indicated. We will continue with therapeutic lifestyle changes (TLC).  PALPITATIONS -   These are rare. No change in therapy is indicated.   CARDIOMYOPATHY This is very mild. I will follow this clinically.  DYSLIPIDEMIA -  He is doing well on his pravastatin and his LDL was 52 when last checked. He will remain on the meds as  listed.  INSOMNIA - We discussed sleep hygiene.

## 2016-04-04 ENCOUNTER — Ambulatory Visit (INDEPENDENT_AMBULATORY_CARE_PROVIDER_SITE_OTHER): Payer: Medicare Other | Admitting: Cardiology

## 2016-04-04 ENCOUNTER — Encounter: Payer: Self-pay | Admitting: Cardiology

## 2016-04-04 VITALS — BP 110/80 | HR 60 | Ht 74.0 in | Wt 238.0 lb

## 2016-04-04 DIAGNOSIS — I10 Essential (primary) hypertension: Secondary | ICD-10-CM | POA: Diagnosis not present

## 2016-04-04 DIAGNOSIS — R002 Palpitations: Secondary | ICD-10-CM | POA: Diagnosis not present

## 2016-04-04 MED ORDER — CARVEDILOL 6.25 MG PO TABS: ORAL_TABLET | ORAL | 3 refills | Status: DC

## 2016-04-04 MED ORDER — DILTIAZEM HCL ER COATED BEADS 120 MG PO CP24: 120.0000 mg | ORAL_CAPSULE | Freq: Every day | ORAL | 3 refills | Status: DC

## 2016-04-04 MED ORDER — PRAVASTATIN SODIUM 40 MG PO TABS: 40.0000 mg | ORAL_TABLET | Freq: Every day | ORAL | 3 refills | Status: DC

## 2016-04-04 NOTE — Patient Instructions (Signed)
Medication Instructions:  The current medical regimen is effective;  continue present plan and medications.  Follow-Up: Follow up in 1 year with Dr. Hochrein in [place].  You will receive a letter in the mail 2 months before you are due.  Please call us when you receive this letter to schedule your follow up appointment.  If you need a refill on your cardiac medications before your next appointment, please call your pharmacy.  Thank you for choosing [facility]!!     

## 2016-04-05 NOTE — Addendum Note (Signed)
Addended by: Shellia Cleverly on: 04/05/2016 04:15 PM   Modules accepted: Orders

## 2016-04-10 ENCOUNTER — Other Ambulatory Visit: Payer: Self-pay | Admitting: Family Medicine

## 2016-04-10 DIAGNOSIS — H6092 Unspecified otitis externa, left ear: Secondary | ICD-10-CM

## 2016-04-30 ENCOUNTER — Ambulatory Visit (INDEPENDENT_AMBULATORY_CARE_PROVIDER_SITE_OTHER): Payer: Medicare Other

## 2016-04-30 ENCOUNTER — Ambulatory Visit (INDEPENDENT_AMBULATORY_CARE_PROVIDER_SITE_OTHER): Payer: Medicare Other | Admitting: Family Medicine

## 2016-04-30 ENCOUNTER — Encounter: Payer: Self-pay | Admitting: Family Medicine

## 2016-04-30 VITALS — BP 110/65 | HR 61 | Temp 97.7°F | Ht 74.0 in | Wt 245.4 lb

## 2016-04-30 DIAGNOSIS — R5382 Chronic fatigue, unspecified: Secondary | ICD-10-CM

## 2016-04-30 DIAGNOSIS — G4700 Insomnia, unspecified: Secondary | ICD-10-CM | POA: Diagnosis not present

## 2016-04-30 DIAGNOSIS — I10 Essential (primary) hypertension: Secondary | ICD-10-CM

## 2016-04-30 DIAGNOSIS — R5383 Other fatigue: Secondary | ICD-10-CM | POA: Insufficient documentation

## 2016-04-30 DIAGNOSIS — M25511 Pain in right shoulder: Secondary | ICD-10-CM | POA: Diagnosis not present

## 2016-04-30 DIAGNOSIS — E785 Hyperlipidemia, unspecified: Secondary | ICD-10-CM

## 2016-04-30 MED ORDER — AMITRIPTYLINE HCL 25 MG PO TABS: 12.5000 mg | ORAL_TABLET | Freq: Every day | ORAL | 0 refills | Status: DC

## 2016-04-30 NOTE — Progress Notes (Signed)
Subjective:  Patient ID: Travis Padilla, male    DOB: 25-Apr-1946  Age: 70 y.o. MRN: [id number]  CC: Hypertension (6 mth rck); Hyperlipidemia; and Insomnia   HPI Travis Padilla presents for  follow-up of hypertension. Patient has no history of headache chest pain or shortness of breath or recent cough. Patient also denies symptoms of TIA such as numbness weakness lateralizing. Patient denies side effects from his medication. States taking it regularly.  Patient also  in for follow-up of elevated cholesterol. Doing well without complaints on current medication. Denies side effects of statin including myalgia and arthralgia and nausea. Also in today for liver function testing. Currently no chest pain, shortness of breath or other cardiovascular related symptoms noted.  Patient also says that his energy is low he is feeling a lot of fatigue. He feels like he is losing muscle strength in spite of exercise. In particular his right shoulder is weak and has lost range of motion. Patient says that some of this is related to sleep. She is using periodic intermittent doses of the amitriptyline as well as occasional use of a  He has backed off on the Wallace to every other day due to the elevation of his B12 last time. Additionally chart review shows that his thyroid function TSH was elevated one year ago. He is not taking a thyroid medication currently.    History Travis Padilla has a past medical history of Abnormal nuclear stress test; Anemia; CAD (coronary artery disease); Chest pain; Dizziness; Fungus infection; Gout; Hemorrhoid; Hyperlipidemia; Hypertension; Skin cancer; and Spinal stenosis.   He has a past surgical history that includes Knee cartilage surgery (Left); Cataract extraction w/ intraocular lens  implant, bilateral; Blepharoptosis repair (Bilateral); Colonoscopy (N/A, 11/03/2014); and Skin cancer excision (12/2014, 04/25/15).   His family history includes Heart attack in his father; Heart failure in  his mother.He reports that he has quit smoking. His smoking use included Cigarettes. He started smoking about 47 years ago. He has a 0.40 pack-year smoking history. He has never used smokeless tobacco. He reports that he does not drink alcohol or use drugs.  Current Outpatient Prescriptions on File Prior to Visit  Medication Sig Dispense Refill  . aspirin 81 MG tablet Take 81 mg by mouth daily.    . carvedilol (COREG) 6.25 MG tablet Take 1 and 1/2 tablets by  mouth two times daily with  a meal 270 tablet 3  . diltiazem (CARDIZEM CD) 120 MG 24 hr capsule Take 1 capsule (120 mg total) by mouth daily. 90 capsule 3  . Folic Acid-Vit O1-HYQ M57 (FOLBEE) 2.5-25-1 MG TABS tablet Take 1 tablet by mouth daily. 90 tablet 3  . Iron-Vit C-Vit B12-Folic Acid (IRON 846 PLUS PO) Take 1 tablet by mouth every morning.     . Multiple Vitamin (MULTI-VITAMIN PO) Take by mouth.    . pravastatin (PRAVACHOL) 40 MG tablet Take 1 tablet (40 mg total) by mouth daily. 90 tablet 3   No current facility-administered medications on file prior to visit.     ROS Review of Systems  Constitutional: Positive for activity change and fatigue. Negative for chills, diaphoresis, fever and unexpected weight change.  HENT: Negative for congestion, hearing loss and sore throat.   Eyes: Negative for visual disturbance.  Respiratory: Negative for cough and shortness of breath.   Cardiovascular: Negative for chest pain.  Gastrointestinal: Negative for abdominal pain, constipation and diarrhea.  Genitourinary: Negative for dysuria and flank pain.  Musculoskeletal: Positive for arthralgias and  neck pain. Negative for joint swelling and myalgias.  Skin: Negative for rash.  Neurological: Negative for dizziness and headaches.  Psychiatric/Behavioral: Negative for dysphoric mood and sleep disturbance.    Objective:  BP 110/65 (BP Location: Left Arm, Patient Position: Sitting, Cuff Size: Large)   Pulse 61   Temp 97.7 F (36.5 C)  (Oral)   Ht 6' 2" (1.88 m)   Wt 245 lb 6.4 oz (111.3 kg)   SpO2 97%   BMI 31.51 kg/m   BP Readings from Last 3 Encounters:  04/30/16 110/65  04/04/16 110/80  01/31/16 139/79    Wt Readings from Last 3 Encounters:  04/30/16 245 lb 6.4 oz (111.3 kg)  04/04/16 238 lb (108 kg)  01/31/16 246 lb (111.6 kg)     Physical Exam  Constitutional: He is oriented to person, place, and time. He appears well-developed and well-nourished. No distress.  HENT:  Head: Normocephalic and atraumatic.  Right Ear: External ear normal.  Left Ear: External ear normal.  Nose: Nose normal.  Mouth/Throat: Oropharynx is clear and moist.  Eyes: Conjunctivae and EOM are normal. Pupils are equal, round, and reactive to light.  Neck: Normal range of motion. Neck supple. No thyromegaly present.  Cardiovascular: Normal rate, regular rhythm and normal heart sounds.   No murmur heard. Pulmonary/Chest: Effort normal and breath sounds normal. No respiratory distress. He has no wheezes. He has no rales.  Abdominal: Soft. Bowel sounds are normal. He exhibits no distension. There is no tenderness.  Musculoskeletal: He exhibits tenderness (right acromioclavicular/GH joints). He exhibits no deformity (however there is decreased range of motion for external rotation at neutral.).  Lymphadenopathy:    He has no cervical adenopathy.  Neurological: He is alert and oriented to person, place, and time. He has normal reflexes.  Skin: Skin is warm and dry.  Psychiatric: His speech is normal and behavior is normal. Thought content normal. His affect is blunt. Cognition and memory are normal.    No results found for: HGBA1C  Lab Results  Component Value Date   WBC 7.2 01/31/2016   HGB 13.2 (A) 03/30/2015   HCT 37.6 01/31/2016   PLT 247 01/31/2016   GLUCOSE 86 01/31/2016   CHOL 131 01/31/2016   TRIG 247 (H) 01/31/2016   HDL 30 (L) 01/31/2016   LDLCALC 52 01/31/2016   ALT 19 01/31/2016   AST 17 01/31/2016   NA 142  01/31/2016   K 4.7 01/31/2016   CL 104 01/31/2016   CREATININE 0.94 01/31/2016   BUN 15 01/31/2016   CO2 23 01/31/2016   TSH 5.336 (H) 07/21/2015   PSA 2.2 08/04/2014   INR 1.1 (H) 08/21/2012    Dg Abd 1 View  Result Date: 05/02/2015 CLINICAL DATA:  Pre lithotripsy, right-sided stone per patient. EXAM: ABDOMEN - 1 VIEW COMPARISON:  CT 04/18/2015 FINDINGS: Bowel gas pattern is nonobstructive. There are no abnormal calcifications projected over the kidneys. There are multiple calcific densities over the right pelvis most of which likely represent phleboliths. Remainder of the exam is unremarkable. IMPRESSION: Nonobstructive bowel gas pattern. No definite renal stones present. Electronically Signed   By: Marin Olp M.D.   On: 05/02/2015 10:46    Assessment & Plan:   Enos was seen today for hypertension, hyperlipidemia and insomnia.  Diagnoses and all orders for this visit:  Essential hypertension, benign -     CMP14+EGFR  Chronic fatigue -     TSH + free T4 -     Testosterone,Free and  Total -     CMP14+EGFR  Insomnia -     TSH + free T4 -     Testosterone,Free and Total -     CMP14+EGFR  Hyperlipidemia -     CMP14+EGFR  Right shoulder pain -     DG Shoulder Right; Future -     Ambulatory referral to Physical Therapy  Other orders -     amitriptyline (ELAVIL) 25 MG tablet; Take 0.5 tablets (12.5 mg total) by mouth at bedtime. For sleep   I have discontinued Mr. Minchey OVER THE COUNTER MEDICATION, Vitamin D (Ergocalciferol), and CIPRODEX. I have also changed his amitriptyline. Additionally, I am having him maintain his Iron-Vit C-Vit B12-Folic Acid (IRON 433 PLUS PO), Folic Acid-Vit I9-JJO A41, Multiple Vitamin (MULTI-VITAMIN PO), aspirin, carvedilol, diltiazem, and pravastatin.  Meds ordered this encounter  Medications  . amitriptyline (ELAVIL) 25 MG tablet    Sig: Take 0.5 tablets (12.5 mg total) by mouth at bedtime. For sleep    Dispense:  90 tablet    Refill:   0     Follow-up: Return in about 6 months (around 10/31/2016), or if symptoms worsen or fail to improve, for Wellness.  Claretta Fraise, M.D.

## 2016-05-01 LAB — CMP14+EGFR
A/G RATIO: 1.4 (ref 1.2–2.2)
ALK PHOS: 64 IU/L (ref 39–117)
ALT: 16 IU/L (ref 0–44)
AST: 17 IU/L (ref 0–40)
Albumin: 4.1 g/dL (ref 3.6–4.8)
BILIRUBIN TOTAL: 0.4 mg/dL (ref 0.0–1.2)
BUN/Creatinine Ratio: 18 (ref 10–24)
BUN: 17 mg/dL (ref 8–27)
CHLORIDE: 105 mmol/L (ref 96–106)
CO2: 23 mmol/L (ref 18–29)
CREATININE: 0.95 mg/dL (ref 0.76–1.27)
Calcium: 9.2 mg/dL (ref 8.6–10.2)
GFR calc Af Amer: 95 mL/min/{1.73_m2} (ref >59)
GFR calc non Af Amer: 82 mL/min/{1.73_m2} (ref >59)
GLOBULIN, TOTAL: 3 g/dL (ref 1.5–4.5)
Glucose: 93 mg/dL (ref 65–99)
POTASSIUM: 4.4 mmol/L (ref 3.5–5.2)
SODIUM: 143 mmol/L (ref 134–144)
Total Protein: 7.1 g/dL (ref 6.0–8.5)

## 2016-05-01 LAB — TSH+FREE T4
FREE T4: 0.81 ng/dL — AB (ref 0.82–1.77)
TSH: 4.65 u[IU]/mL — AB (ref 0.450–4.500)

## 2016-05-01 LAB — TESTOSTERONE,FREE AND TOTAL
TESTOSTERONE: 246 ng/dL — AB (ref 264–916)
Testosterone, Free: 3.3 pg/mL — ABNORMAL LOW (ref 6.6–18.1)

## 2016-05-03 ENCOUNTER — Other Ambulatory Visit: Payer: Medicare Other

## 2016-05-03 DIAGNOSIS — R7989 Other specified abnormal findings of blood chemistry: Secondary | ICD-10-CM

## 2016-05-03 DIAGNOSIS — E291 Testicular hypofunction: Secondary | ICD-10-CM | POA: Diagnosis not present

## 2016-05-04 LAB — TESTOSTERONE,FREE AND TOTAL
Testosterone, Free: 3.1 pg/mL — ABNORMAL LOW (ref 6.6–18.1)
Testosterone: 267 ng/dL (ref 264–916)

## 2016-05-07 DIAGNOSIS — L814 Other melanin hyperpigmentation: Secondary | ICD-10-CM | POA: Diagnosis not present

## 2016-05-07 DIAGNOSIS — D1801 Hemangioma of skin and subcutaneous tissue: Secondary | ICD-10-CM | POA: Diagnosis not present

## 2016-05-07 DIAGNOSIS — L821 Other seborrheic keratosis: Secondary | ICD-10-CM | POA: Diagnosis not present

## 2016-05-07 DIAGNOSIS — L570 Actinic keratosis: Secondary | ICD-10-CM | POA: Diagnosis not present

## 2016-05-07 DIAGNOSIS — C44519 Basal cell carcinoma of skin of other part of trunk: Secondary | ICD-10-CM | POA: Diagnosis not present

## 2016-05-07 DIAGNOSIS — D225 Melanocytic nevi of trunk: Secondary | ICD-10-CM | POA: Diagnosis not present

## 2016-05-07 DIAGNOSIS — Z85828 Personal history of other malignant neoplasm of skin: Secondary | ICD-10-CM | POA: Diagnosis not present

## 2016-05-07 DIAGNOSIS — D2271 Melanocytic nevi of right lower limb, including hip: Secondary | ICD-10-CM | POA: Diagnosis not present

## 2016-05-07 DIAGNOSIS — Z85820 Personal history of malignant melanoma of skin: Secondary | ICD-10-CM | POA: Diagnosis not present

## 2016-05-08 ENCOUNTER — Ambulatory Visit: Payer: Medicare Other | Attending: Family Medicine | Admitting: Physical Therapy

## 2016-05-08 ENCOUNTER — Encounter: Payer: Self-pay | Admitting: Physical Therapy

## 2016-05-08 ENCOUNTER — Telehealth: Payer: Self-pay | Admitting: Family Medicine

## 2016-05-08 DIAGNOSIS — M25511 Pain in right shoulder: Secondary | ICD-10-CM | POA: Insufficient documentation

## 2016-05-08 NOTE — Patient Instructions (Addendum)
Trigger Point Dry Needling  . What is Trigger Point Dry Needling (DN)? o DN is a physical therapy technique used to treat muscle pain and dysfunction. Specifically, DN helps deactivate muscle trigger points (muscle knots).  o A thin filiform needle is used to penetrate the skin and stimulate the underlying trigger point. The goal is for a local twitch response (LTR) to occur and for the trigger point to relax. No medication of any kind is injected during the procedure.   . What Does Trigger Point Dry Needling Feel Like?  o The procedure feels different for each individual patient. Some patients report that they do not actually feel the needle enter the skin and overall the process is not painful. Very mild bleeding may occur. However, many patients feel a deep cramping in the muscle in which the needle was inserted. This is the local twitch response.   Marland Kitchen How Will I feel after the treatment? o Soreness is normal, and the onset of soreness may not occur for a few hours. Typically this soreness does not last longer than two days.  o Bruising is uncommon, however; ice can be used to decrease any possible bruising.  o In rare cases feeling tired or nauseous after the treatment is normal. In addition, your symptoms may get worse before they get better, this period will typically not last longer than 24 hours.   . What Can I do After My Treatment? o Increase your hydration by drinking more water for the next 24 hours. o You may place ice or heat on the areas treated that have become sore, however, do not use heat on inflamed or bruised areas. Heat often brings more relief post needling. o You can continue your regular activities, but vigorous activity is not recommended initially after the treatment for 24 hours. o DN is best combined with other physical therapy such as strengthening, stretching, and other therapies.      Madelyn Flavors, PT 05/08/16 11:21 AM [facility] [place], [place], [address] Phone: [phone]   Fax:  [phone]

## 2016-05-08 NOTE — Therapy (Signed)
[facility] [place], [place], [address] Phone: [phone]   Fax:  [phone]  Physical Therapy Evaluation  Patient Details  Name: Travis Padilla MRN: [id number] Date of Birth: [birth year] Referring Provider: Claretta Fraise, MD  Encounter Date: 05/08/2016      PT End of Session - 05/08/16 1045    Visit Number 1   Number of Visits 12   Date for PT Re-Evaluation 06/19/16   PT Start Time 4193   PT Stop Time 1130   PT Time Calculation (min) 45 min   Activity Tolerance Patient tolerated treatment well   Behavior During Therapy [facility] for tasks assessed/performed      Past Medical History:  Diagnosis Date  . Abnormal nuclear stress test    December, 2013  . Anemia   . CAD (coronary artery disease)    Mild nonobstructive plaque in cath 2013  . Chest pain    December, 2013  . Dizziness   . Fungus infection    in ear   . Gout   . Hemorrhoid   . Hyperlipidemia   . Hypertension   . Skin cancer   . Spinal stenosis     Past Surgical History:  Procedure Laterality Date  . BELPHAROPTOSIS REPAIR Bilateral   . CATARACT EXTRACTION W/ INTRAOCULAR LENS  IMPLANT, BILATERAL    . COLONOSCOPY N/A 11/03/2014   Procedure: COLONOSCOPY;  Surgeon: Rogene Houston, MD;  [facility];  Service: Endoscopy;  Laterality: N/A;  1225  . KNEE CARTILAGE SURGERY Left    Left knee  . SKIN CANCER EXCISION  12/2014, 04/25/15    There were no vitals filed for this visit.       Subjective Assessment - 05/08/16 1046    Subjective Patient reports decreased ROM and decreased strength in R shoulder as well as pain. This has been happening since mid 2017. He has tried massage therapy which has helped but not fully.   Patient Stated Goals decrease pain, improve strength and ROM   Currently in Pain? Yes   Pain Score 7    Pain Location Shoulder   Pain Orientation Right   Pain Descriptors / Indicators Sharp   Pain Type Acute pain   Pain  Onset More than a month ago   Pain Frequency Intermittent   Aggravating Factors  swatting motion, ER   Pain Relieving Factors rest   Effect of Pain on Daily Activities limited            [facility] PT Assessment - 05/08/16 0001      Assessment   Medical Diagnosis Right shoulder pain   Referring Provider Claretta Fraise, MD   Onset Date/Surgical Date 02/09/16   Hand Dominance Right   Next MD Visit 10/30/16     Precautions   Precautions None     Balance Screen   Has the patient fallen in the past 6 months Yes   How many times? 1   Has the patient had a decrease in activity level because of a fear of falling?  No   Is the patient reluctant to leave their home because of a fear of falling?  No     Prior Function   Level of Independence Independent   Vocation Retired     Mining engineer Comments Rounded shoulders; tight QL on R     ROM / Strength   AROM / PROM / Strength AROM;PROM;Strength     AROM   AROM  Assessment Site Shoulder   Right/Left Shoulder Right   Right Shoulder Extension --  full   Right Shoulder Flexion 156 Degrees   Right Shoulder ABduction 160 Degrees   Right Shoulder Internal Rotation 40 Degrees   Right Shoulder External Rotation --  WNL     PROM   PROM Assessment Site Shoulder   Right/Left Shoulder Right   Right Shoulder Flexion 165 Degrees   Right Shoulder Internal Rotation 56 Degrees     Strength   Overall Strength Comments R mid traps 5/5; low traps 4+/5   Strength Assessment Site Shoulder   Right/Left Shoulder Right   Right Shoulder Flexion 4+/5   Right Shoulder Extension 5/5   Right Shoulder ABduction 4+/5   Right Shoulder Internal Rotation 5/5   Right Shoulder External Rotation 5/5     Palpation   Palpation comment marked tenderness of R subscapularis, UT, lev Scap and mild pecs     Special Tests    Special Tests Rotator Cuff Impingement   Rotator Cuff Impingment tests --  Negative shoulder special tests                    [facility] Adult PT Treatment/Exercise - 05/08/16 0001      Modalities   Modalities Electrical Stimulation     Electrical Stimulation   Electrical Stimulation Location R shoulder IFC 80-150 Hz x 15 min   Electrical Stimulation Goals Pain                PT Education - 05/08/16 1222    Education provided Yes   Education Details HEP and education on DN   Person(s) Educated Patient   Methods Explanation;Demonstration;Handout   Comprehension Verbalized understanding;Returned demonstration             PT Long Term Goals - 05/08/16 1227      PT LONG TERM GOAL #1   Title I with HEP   Time 4   Period Weeks   Status New     PT LONG TERM GOAL #2   Title Patient able to reach behind his back without pain   Time 4   Period Weeks   Status New     PT LONG TERM GOAL #3   Title Patient able to perform throwing motion with 1-2/10 pain or less.   Time 4   Period Weeks   Status New     PT LONG TERM GOAL #4   Title Patient able to tolerate his normal exercise routine with 80% improvement in R shoulder.   Time 4   Period Weeks   Status New               Plan - 05/08/16 1223    Clinical Impression Statement Patient presents with R shoulder pain and decreased ROM limiting ADLs including his normal fitness routine. He has negative special tests and good strength, but some postural deficits. He has active TPs in the R RC and parascapular muscles and will benefit from TP Dry Needling.    Rehab Potential Excellent   PT Frequency 2x / week   PT Duration 4 weeks   PT Treatment/Interventions Cryotherapy;Moist Heat;ADLs/Self Care Home Management;Patient/family education;Therapeutic exercise;Passive range of motion;Manual techniques;Electrical Stimulation;Ultrasound;Neuromuscular re-education;Dry needling   PT Next Visit Plan DN to R subscap, UT, lev scap and pecs, manual to same. Sleeper stretch for IR. Progress to strengthening. Modalities for pain.    PT Home Exercise Plan doorway stretch for internal rotators   Consulted and  Agree with Plan of Care Patient      Patient will benefit from skilled therapeutic intervention in order to improve the following deficits and impairments:  Decreased range of motion, Pain, Postural dysfunction, Impaired flexibility  Visit Diagnosis: Pain in right shoulder - Plan: PT plan of care cert/re-cert      G-Codes - 17/35/67 1230    Functional Assessment Tool Used FOTO 43% limited   Functional Limitation Carrying, moving and handling objects   Carrying, Moving and Handling Objects Current Status (O1410) At least 40 percent but less than 60 percent impaired, limited or restricted   Carrying, Moving and Handling Objects Goal Status (V0131) At least 20 percent but less than 40 percent impaired, limited or restricted       Problem List Patient Active Problem List   Diagnosis Date Noted  . Fatigue 04/30/2016  . Insomnia 04/30/2016  . Hyperlipidemia 01/31/2016  . Ear pain 05/09/2015  . Anemia   . Abnormal nuclear stress test   . OBESITY, UNSPECIFIED 03/28/2009  . ESSENTIAL HYPERTENSION, BENIGN 03/28/2009  . PALPITATIONS 03/28/2009  . SNORING 03/28/2009    Madelyn Flavors PT 05/08/2016, 12:33 PM  [facility] [place], [place], [address] Phone: [phone]   Fax:  [phone]  Name: Travis Padilla MRN: [id number] Date of Birth: 1945-11-03

## 2016-05-08 NOTE — Telephone Encounter (Signed)
Spoke with pt regarding lab for testosterone level Pt will come back to repeat

## 2016-05-09 ENCOUNTER — Other Ambulatory Visit: Payer: Medicare Other

## 2016-05-09 DIAGNOSIS — R5383 Other fatigue: Secondary | ICD-10-CM

## 2016-05-09 DIAGNOSIS — E785 Hyperlipidemia, unspecified: Secondary | ICD-10-CM | POA: Diagnosis not present

## 2016-05-09 DIAGNOSIS — Z125 Encounter for screening for malignant neoplasm of prostate: Secondary | ICD-10-CM | POA: Diagnosis not present

## 2016-05-09 DIAGNOSIS — E559 Vitamin D deficiency, unspecified: Secondary | ICD-10-CM | POA: Diagnosis not present

## 2016-05-09 DIAGNOSIS — I10 Essential (primary) hypertension: Secondary | ICD-10-CM

## 2016-05-10 LAB — CBC WITH DIFFERENTIAL/PLATELET
BASOS: 0 %
Basophils Absolute: 0 10*3/uL (ref 0.0–0.2)
EOS (ABSOLUTE): 0.2 10*3/uL (ref 0.0–0.4)
EOS: 4 %
HEMATOCRIT: 38.4 % (ref 37.5–51.0)
Hemoglobin: 12.9 g/dL (ref 12.6–17.7)
IMMATURE GRANULOCYTES: 0 %
Immature Grans (Abs): 0 10*3/uL (ref 0.0–0.1)
LYMPHS ABS: 1.4 10*3/uL (ref 0.7–3.1)
Lymphs: 30 %
MCH: 29.9 pg (ref 26.6–33.0)
MCHC: 33.6 g/dL (ref 31.5–35.7)
MCV: 89 fL (ref 79–97)
MONOS ABS: 0.6 10*3/uL (ref 0.1–0.9)
Monocytes: 12 %
Neutrophils Absolute: 2.6 10*3/uL (ref 1.4–7.0)
Neutrophils: 54 %
PLATELETS: 183 10*3/uL (ref 150–379)
RBC: 4.31 x10E6/uL (ref 4.14–5.80)
RDW: 14.1 % (ref 12.3–15.4)
WBC: 4.8 10*3/uL (ref 3.4–10.8)

## 2016-05-10 LAB — BMP8+EGFR
BUN/Creatinine Ratio: 14 (ref 10–24)
BUN: 14 mg/dL (ref 8–27)
CALCIUM: 9.1 mg/dL (ref 8.6–10.2)
CHLORIDE: 104 mmol/L (ref 96–106)
CO2: 24 mmol/L (ref 18–29)
Creatinine, Ser: 0.99 mg/dL (ref 0.76–1.27)
GFR calc non Af Amer: 78 mL/min/{1.73_m2} (ref >59)
GFR, EST AFRICAN AMERICAN: 90 mL/min/{1.73_m2} (ref >59)
Glucose: 96 mg/dL (ref 65–99)
Potassium: 4.3 mmol/L (ref 3.5–5.2)
Sodium: 141 mmol/L (ref 134–144)

## 2016-05-10 LAB — LIPID PANEL
CHOL/HDL RATIO: 3.4 ratio (ref 0.0–5.0)
Cholesterol, Total: 119 mg/dL (ref 100–199)
HDL: 35 mg/dL — AB (ref >39)
LDL CALC: 65 mg/dL (ref 0–99)
TRIGLYCERIDES: 93 mg/dL (ref 0–149)
VLDL Cholesterol Cal: 19 mg/dL (ref 5–40)

## 2016-05-10 LAB — HEPATIC FUNCTION PANEL
ALBUMIN: 4.1 g/dL (ref 3.6–4.8)
ALT: 14 IU/L (ref 0–44)
AST: 16 IU/L (ref 0–40)
Alkaline Phosphatase: 57 IU/L (ref 39–117)
BILIRUBIN TOTAL: 0.4 mg/dL (ref 0.0–1.2)
BILIRUBIN, DIRECT: 0.13 mg/dL (ref 0.00–0.40)
Total Protein: 6.8 g/dL (ref 6.0–8.5)

## 2016-05-10 LAB — PSA, TOTAL AND FREE
PROSTATE SPECIFIC AG, SERUM: 2.4 ng/mL (ref 0.0–4.0)
PSA FREE PCT: 22.1 %
PSA FREE: 0.53 ng/mL

## 2016-05-10 LAB — VITAMIN D 25 HYDROXY (VIT D DEFICIENCY, FRACTURES): Vit D, 25-Hydroxy: 49.4 ng/mL (ref 30.0–100.0)

## 2016-05-10 LAB — TESTOSTERONE,FREE AND TOTAL
Testosterone, Free: 3 pg/mL — ABNORMAL LOW (ref 6.6–18.1)
Testosterone: 285 ng/dL (ref 264–916)

## 2016-05-15 ENCOUNTER — Ambulatory Visit: Payer: Medicare Other | Attending: Family Medicine | Admitting: Physical Therapy

## 2016-05-15 DIAGNOSIS — M25511 Pain in right shoulder: Secondary | ICD-10-CM | POA: Insufficient documentation

## 2016-05-15 DIAGNOSIS — M542 Cervicalgia: Secondary | ICD-10-CM | POA: Diagnosis not present

## 2016-05-15 NOTE — Therapy (Signed)
[facility] [place], [place], [address] Phone: [phone]   Fax:  [phone]  Physical Therapy Treatment  Patient Details  Name: Travis Padilla MRN: [id number] Date of Birth: [birth year] Referring Provider: Claretta Fraise, MD  Encounter Date: 05/15/2016      PT End of Session - 05/15/16 1115    Visit Number 2   Number of Visits 12   Date for PT Re-Evaluation 06/19/16   PT Start Time 1115   PT Stop Time 1222   PT Time Calculation (min) 67 min      Past Medical History:  Diagnosis Date  . Abnormal nuclear stress test    December, 2013  . Anemia   . CAD (coronary artery disease)    Mild nonobstructive plaque in cath 2013  . Chest pain    December, 2013  . Dizziness   . Fungus infection    in ear   . Gout   . Hemorrhoid   . Hyperlipidemia   . Hypertension   . Skin cancer   . Spinal stenosis     Past Surgical History:  Procedure Laterality Date  . BELPHAROPTOSIS REPAIR Bilateral   . CATARACT EXTRACTION W/ INTRAOCULAR LENS  IMPLANT, BILATERAL    . COLONOSCOPY N/A 11/03/2014   Procedure: COLONOSCOPY;  Surgeon: Rogene Houston, MD;  [facility];  Service: Endoscopy;  Laterality: N/A;  1225  . KNEE CARTILAGE SURGERY Left    Left knee  . SKIN CANCER EXCISION  12/2014, 04/25/15    There were no vitals filed for this visit.                       [facility] Adult PT Treatment/Exercise - 05/15/16 0001      Exercises   Exercises Shoulder     Shoulder Exercises: Stretch   Other Shoulder Stretches UT and Lev Scap stretches   Other Shoulder Stretches attempted sleeper stretch, but more painful than a stretch     Modalities   Modalities Electrical Stimulation;Moist Heat     Moist Heat Therapy   Number Minutes Moist Heat 15 Minutes   Moist Heat Location Shoulder     Electrical Stimulation   Electrical Stimulation Location R shoulder IFC 80-150 Hz x 15 min   Electrical Stimulation  Goals Pain     Manual Therapy   Manual Therapy Soft tissue mobilization   Soft tissue mobilization to R UT, lev scap, pecs and lateral scapula          Trigger Point Dry Needling - 05/15/16 1213    Consent Given? Yes   Education Handout Provided Yes  h/o given first visit   Muscles Treated Upper Body Upper trapezius;Pectoralis major;Levator scapulae  deltoids   Upper Trapezius Response Twitch reponse elicited;Palpable increased muscle length   Pectoralis Major Response Twitch response elicited;Palpable increased muscle length   Levator Scapulae Response Twitch response elicited;Palpable increased muscle length              PT Education - 05/15/16 1209    Education provided Yes   Education Details HEP; DN aftercare and lung field precautions   Person(s) Educated Patient   Methods Explanation;Demonstration;Handout   Comprehension Verbalized understanding;Returned demonstration             PT Long Term Goals - 05/08/16 1227      PT LONG TERM GOAL #1   Title I with HEP   Time 4   Period Weeks  Status New     PT LONG TERM GOAL #2   Title Patient able to reach behind his back without pain   Time 4   Period Weeks   Status New     PT LONG TERM GOAL #3   Title Patient able to perform throwing motion with 1-2/10 pain or less.   Time 4   Period Weeks   Status New     PT LONG TERM GOAL #4   Title Patient able to tolerate his normal exercise routine with 80% improvement in R shoulder.   Time 4   Period Weeks   Status New               Plan - 05/15/16 1215    Clinical Impression Statement Patient tolerated DN well. No goals met as only second visit.   Rehab Potential Excellent   PT Frequency 2x / week   PT Duration 4 weeks   PT Treatment/Interventions Cryotherapy;Moist Heat;ADLs/Self Care Home Management;Patient/family education;Therapeutic exercise;Passive range of motion;Manual techniques;Electrical Stimulation;Ultrasound;Neuromuscular  re-education;Dry needling   PT Next Visit Plan Assess DN to R subscap, UT, lev scap and pecs, manual to same. Manual therapy. Progress to strengthening. Modalities for pain.   PT Home Exercise Plan cervical tension stretches, doorway stretch for internal rotators   Consulted and Agree with Plan of Care Patient      Patient will benefit from skilled therapeutic intervention in order to improve the following deficits and impairments:  Decreased range of motion, Pain, Postural dysfunction, Impaired flexibility  Visit Diagnosis: Pain in right shoulder  Neck pain     Problem List Patient Active Problem List   Diagnosis Date Noted  . Fatigue 04/30/2016  . Insomnia 04/30/2016  . Hyperlipidemia 01/31/2016  . Ear pain 05/09/2015  . Anemia   . Abnormal nuclear stress test   . OBESITY, UNSPECIFIED 03/28/2009  . ESSENTIAL HYPERTENSION, BENIGN 03/28/2009  . PALPITATIONS 03/28/2009  . SNORING 03/28/2009    Madelyn Flavors PT 05/15/2016, 12:19 PM  [facility] [place], [place], [address] Phone: [phone]   Fax:  [phone]  Name: SEBASTIAN LURZ MRN: [id number] Date of Birth: 07-22-1946

## 2016-05-15 NOTE — Patient Instructions (Signed)
  Flexibility: Upper Trapezius Stretch   Gently grasp right side of head while reaching behind back with other hand. Tilt head away until a gentle stretch is felt. Hold 30 seconds. Repeat 3 times per set. Do 2 sessions per day.  http://orth.exer.us/340   Levator Stretch   Grasp seat or sit on hand on side to be stretched. Turn head toward other side and look down. Use hand on head to gently stretch neck in that position. Hold _30___ seconds. Repeat on other side. Repeat 3 times. Do 2 sessions per day.  http://gt2.exer.us/30   Scapular Retraction (Standing)   With arms at sides, pinch shoulder blades together. Repeat 10 times per set. Do 1-3 sets per session. Do 2 sessions per day.   Madelyn Flavors, PT 05/15/16 12:09 PM [facility] [place], [place], [address] Phone: [phone]   Fax:  [phone]

## 2016-05-21 ENCOUNTER — Ambulatory Visit: Payer: Medicare Other | Admitting: Physical Therapy

## 2016-05-21 DIAGNOSIS — M25511 Pain in right shoulder: Secondary | ICD-10-CM

## 2016-05-21 DIAGNOSIS — M542 Cervicalgia: Secondary | ICD-10-CM | POA: Diagnosis not present

## 2016-05-21 NOTE — Patient Instructions (Signed)
PNF Strengthening: Resisted   Standing with resistive band around each hand, bring right arm up and away, thumb back. Do both sides. Repeat 10____ times per set. Do _1-3___ sets per session. Do ___1_ sessions per day.  http://orth.exer.us/918   Copyright  VHI. All rights reserved.  Strengthening: Chest Pull - Resisted   With resistive band looped around each hand, and arms straight out in front, stretch band across chest. Repeat __10__ times per set. Do 1-3____ sets per session. Do _1___ sessions per day.  http://orth.exer.us/926   Resisted External Rotation: in Neutral - Bilateral   Sit or stand, tubing in both hands, elbows at sides, bent to 90, forearms forward. Pinch shoulder blades together and rotate forearms out. Keep elbows at sides. Repeat _10___ times per set. Do _1-3___ sets per session. Do __2_ sessions per day.  http://orth.exer.us/966   Copyright  VHI. All rights reserved.   Madelyn Flavors, PT 05/21/16 10:26 AM [facility] [address] [place], [place], [address] Phone: [phone]   Fax:  [phone]

## 2016-05-21 NOTE — Therapy (Signed)
[facility] [place], [place], [address] Phone: [phone]   Fax:  [phone]  Physical Therapy Treatment  Patient Details  Name: Travis Padilla MRN: [id number] Date of Birth: 1946/01/31 Referring Provider: Claretta Fraise, MD  Encounter Date: 05/21/2016      PT End of Session - 05/21/16 0948    Visit Number 3   Number of Visits 12   Date for PT Re-Evaluation 06/19/16   PT Start Time 0948   PT Stop Time 1047   PT Time Calculation (min) 59 min   Activity Tolerance Patient tolerated treatment well   Behavior During Therapy [facility] for tasks assessed/performed      Past Medical History:  Diagnosis Date  . Abnormal nuclear stress test    December, 2013  . Anemia   . CAD (coronary artery disease)    Mild nonobstructive plaque in cath 2013  . Chest pain    December, 2013  . Dizziness   . Fungus infection    in ear   . Gout   . Hemorrhoid   . Hyperlipidemia   . Hypertension   . Skin cancer   . Spinal stenosis     Past Surgical History:  Procedure Laterality Date  . BELPHAROPTOSIS REPAIR Bilateral   . CATARACT EXTRACTION W/ INTRAOCULAR LENS  IMPLANT, BILATERAL    . COLONOSCOPY N/A 11/03/2014   Procedure: COLONOSCOPY;  Surgeon: Rogene Houston, MD;  [facility];  Service: Endoscopy;  Laterality: N/A;  1225  . KNEE CARTILAGE SURGERY Left    Left knee  . SKIN CANCER EXCISION  12/2014, 04/25/15    There were no vitals filed for this visit.      Subjective Assessment - 05/21/16 1253    Subjective Patient reports he is doing well with neck stretching, but continues to be limited with ER.   Patient Stated Goals decrease pain, improve strength and ROM   Currently in Pain? Yes   Pain Score 6    Pain Location Shoulder   Pain Orientation Right   Pain Descriptors / Indicators Sharp   Pain Type Acute pain   Pain Onset More than a month ago   Pain Frequency Intermittent   Aggravating Factors  ER, swatting  motion   Pain Relieving Factors rest   Effect of Pain on Daily Activities limited                         [facility] Adult PT Treatment/Exercise - 05/21/16 0001      Shoulder Exercises: Standing   External Rotation Strengthening;15 reps   Theraband Level (Shoulder External Rotation) Level 1 (Yellow)   Internal Rotation Strengthening;Right;15 reps;Theraband   Theraband Level (Shoulder Internal Rotation) Level 2 (Red)   Other Standing Exercises D1 flex yellow x 15;  D2 ext x 15;    Other Standing Exercises Diagonals red and horizontal abd; and robber; all x 15     Shoulder Exercises: Pulleys   Flexion 2 minutes     Modalities   Modalities Electrical Stimulation;Moist Heat     Moist Heat Therapy   Number Minutes Moist Heat 15 Minutes   Moist Heat Location Shoulder     Electrical Stimulation   Electrical Stimulation Location R shoulder IFC 80-150 Hz x 15 min   Electrical Stimulation Goals Pain     Manual Therapy   Manual Therapy Soft tissue mobilization   Soft tissue mobilization to Right pecs, deltoids and upper arm  PT Education - 05/21/16 1254    Education provided Yes   Education Details HEP   Person(s) Educated Patient   Methods Explanation;Demonstration;Handout   Comprehension Verbalized understanding;Returned demonstration             PT Long Term Goals - 05/08/16 1227      PT LONG TERM GOAL #1   Title I with HEP   Time 4   Period Weeks   Status New     PT LONG TERM GOAL #2   Title Patient able to reach behind his back without pain   Time 4   Period Weeks   Status New     PT LONG TERM GOAL #3   Title Patient able to perform throwing motion with 1-2/10 pain or less.   Time 4   Period Weeks   Status New     PT LONG TERM GOAL #4   Title Patient able to tolerate his normal exercise routine with 80% improvement in R shoulder.   Time 4   Period Weeks   Status New               Plan - 05/21/16 1255     Clinical Impression Statement Patient tolerated therex fairly well. He demonstrates functional weakness in his R shoulder with PNF. Goals are ongoing.   Rehab Potential Excellent   PT Frequency 2x / week   PT Duration 4 weeks   PT Treatment/Interventions Cryotherapy;Moist Heat;ADLs/Self Care Home Management;Patient/family education;Therapeutic exercise;Passive range of motion;Manual techniques;Electrical Stimulation;Ultrasound;Neuromuscular re-education;Dry needling   PT Next Visit Plan continue strengthening, manual therapy and modalities for pain.   PT Home Exercise Plan Horizontal ABD, unattached diagonals and B ER with band; cervical tension stretches, doorway stretch for internal rotators      Patient will benefit from skilled therapeutic intervention in order to improve the following deficits and impairments:  Decreased range of motion, Pain, Postural dysfunction, Impaired flexibility  Visit Diagnosis: Pain in right shoulder     Problem List Patient Active Problem List   Diagnosis Date Noted  . Fatigue 04/30/2016  . Insomnia 04/30/2016  . Hyperlipidemia 01/31/2016  . Ear pain 05/09/2015  . Anemia   . Abnormal nuclear stress test   . OBESITY, UNSPECIFIED 03/28/2009  . ESSENTIAL HYPERTENSION, BENIGN 03/28/2009  . PALPITATIONS 03/28/2009  . SNORING 03/28/2009   Madelyn Flavors PT 05/21/2016, 1:08 PM  [facility] [place], [place], [address] Phone: [phone]   Fax:  [phone]  Name: Travis Padilla MRN: [id number] Date of Birth: [birth year]

## 2016-05-22 DIAGNOSIS — Z85828 Personal history of other malignant neoplasm of skin: Secondary | ICD-10-CM | POA: Diagnosis not present

## 2016-05-22 DIAGNOSIS — C44519 Basal cell carcinoma of skin of other part of trunk: Secondary | ICD-10-CM | POA: Diagnosis not present

## 2016-05-28 ENCOUNTER — Ambulatory Visit: Payer: Medicare Other | Admitting: Physical Therapy

## 2016-05-28 ENCOUNTER — Encounter: Payer: Self-pay | Admitting: Physical Therapy

## 2016-05-28 DIAGNOSIS — M25511 Pain in right shoulder: Secondary | ICD-10-CM

## 2016-05-28 DIAGNOSIS — M542 Cervicalgia: Secondary | ICD-10-CM | POA: Diagnosis not present

## 2016-05-28 NOTE — Therapy (Addendum)
[facility] [place], [place], [address] Phone: [phone]   Fax:  [phone]  Physical Therapy Treatment  Patient Details  Name: Travis Padilla MRN: [id number] Date of Birth: December 20, 1945 Referring Provider: Claretta Fraise, MD  Encounter Date: 05/28/2016    Past Medical History:  Diagnosis Date  . Abnormal nuclear stress test    December, 2013  . Anemia   . CAD (coronary artery disease)    Mild nonobstructive plaque in cath 2013  . Chest pain    December, 2013  . Dizziness   . Fungus infection    in ear   . Gout   . Hemorrhoid   . Hyperlipidemia   . Hypertension   . Skin cancer   . Spinal stenosis     Past Surgical History:  Procedure Laterality Date  . BELPHAROPTOSIS REPAIR Bilateral   . CATARACT EXTRACTION W/ INTRAOCULAR LENS  IMPLANT, BILATERAL    . COLONOSCOPY N/A 11/03/2014   Procedure: COLONOSCOPY;  Surgeon: Rogene Houston, MD;  [facility];  Service: Endoscopy;  Laterality: N/A;  1225  . KNEE CARTILAGE SURGERY Left    Left knee  . SKIN CANCER EXCISION  12/2014, 04/25/15    There were no vitals filed for this visit.                                    PT Long Term Goals - 05/28/16 1023      PT LONG TERM GOAL #1   Title I with HEP   Time 4   Period Weeks   Status On-going     PT LONG TERM GOAL #2   Title Patient able to reach behind his back without pain   Time 4   Period Weeks   Status On-going     PT LONG TERM GOAL #3   Title Patient able to perform throwing motion with 1-2/10 pain or less.   Time 4   Period Weeks   Status On-going     PT LONG TERM GOAL #4   Title Patient able to tolerate his normal exercise routine with 80% improvement in R shoulder.   Period Weeks   Status On-going  50% 05/28/16     PT LONG TERM GOAL #5   Title tolerate sitting for 20 minutes with pain not >3/10   Time 4   Period Weeks   Status Achieved     PT LONG  TERM GOAL #6   Title sleep undisturbed 6 hours   Time 4   Period Weeks   Status Achieved     PT LONG TERM GOAL #7   Title perform ADL's with pain not > 3/10   Time 4   Period Weeks   Status On-going             Patient will benefit from skilled therapeutic intervention in order to improve the following deficits and impairments:  Decreased range of motion, Pain, Postural dysfunction, Impaired flexibility  Visit Diagnosis: Pain in right shoulder     Problem List Patient Active Problem List   Diagnosis Date Noted  . Fatigue 04/30/2016  . Insomnia 04/30/2016  . Hyperlipidemia 01/31/2016  . Ear pain 05/09/2015  . Anemia   . Abnormal nuclear stress test   . OBESITY, UNSPECIFIED 03/28/2009  . ESSENTIAL HYPERTENSION, BENIGN 03/28/2009  . PALPITATIONS 03/28/2009  . SNORING 03/28/2009    APPLEGATE, [place],  PTA 07/16/2016, 3:59 PM  [facility] [address] [place], [place], [address] Phone: [phone]   Fax:  [phone]  Name: GEORGI NAVARRETE MRN: [id number] Date of Birth: 1945/11/16  PHYSICAL THERAPY DISCHARGE SUMMARY  Visits from Start of Care: 4.  Current functional level related to goals / functional outcomes: Please see above.   Remaining deficits: Continued right shoulder pain.   Education / Equipment: HEP.  Plan: Patient agrees to discharge.  Patient goals were partially met. Patient is being discharged due to not returning since the last visit.  ?????        Mali Applegate MPT

## 2016-06-07 ENCOUNTER — Other Ambulatory Visit: Payer: Self-pay | Admitting: Family Medicine

## 2016-06-08 DIAGNOSIS — Z23 Encounter for immunization: Secondary | ICD-10-CM | POA: Diagnosis not present

## 2016-06-18 ENCOUNTER — Other Ambulatory Visit: Payer: Self-pay | Admitting: *Deleted

## 2016-06-18 MED ORDER — DILTIAZEM HCL ER COATED BEADS 120 MG PO CP24: 120.0000 mg | ORAL_CAPSULE | Freq: Every day | ORAL | 0 refills | Status: DC

## 2016-06-18 NOTE — Telephone Encounter (Signed)
Patient requested a local supply to last him until his mail order arrives.

## 2016-06-20 ENCOUNTER — Other Ambulatory Visit: Payer: Self-pay | Admitting: *Deleted

## 2016-06-20 MED ORDER — DILTIAZEM HCL ER COATED BEADS 120 MG PO CP24: 120.0000 mg | ORAL_CAPSULE | Freq: Every day | ORAL | 2 refills | Status: DC

## 2016-07-11 ENCOUNTER — Ambulatory Visit (INDEPENDENT_AMBULATORY_CARE_PROVIDER_SITE_OTHER): Payer: Medicare Other

## 2016-07-11 ENCOUNTER — Encounter: Payer: Self-pay | Admitting: Family Medicine

## 2016-07-11 ENCOUNTER — Ambulatory Visit (INDEPENDENT_AMBULATORY_CARE_PROVIDER_SITE_OTHER): Payer: Medicare Other | Admitting: Family Medicine

## 2016-07-11 VITALS — BP 131/79 | HR 76 | Temp 97.6°F | Ht 74.0 in | Wt 251.0 lb

## 2016-07-11 DIAGNOSIS — M25572 Pain in left ankle and joints of left foot: Secondary | ICD-10-CM

## 2016-07-11 MED ORDER — GABAPENTIN 300 MG PO CAPS: 300.0000 mg | ORAL_CAPSULE | Freq: Every day | ORAL | 0 refills | Status: DC

## 2016-07-11 NOTE — Progress Notes (Signed)
   Subjective:    Patient ID: Travis Padilla, male    DOB: November 04, 1945, 70 y.o.   MRN: [id number]  HPI 70 year old gentleman with pain in his left foot. There is been no injury. He says the foot feels swollen. Standing and walking make it worse but it may hurt if he is not standing. There is been no increased temperature or redness. He did take some meloxicam without relief. He does have a history of gout. He also describes some tingling and or numbness in his left arm. He does not think these are related but he just mentions that symptom paresthetica.  Patient Active Problem List   Diagnosis Date Noted  . Fatigue 04/30/2016  . Insomnia 04/30/2016  . Hyperlipidemia 01/31/2016  . Ear pain 05/09/2015  . Anemia   . Abnormal nuclear stress test   . OBESITY, UNSPECIFIED 03/28/2009  . ESSENTIAL HYPERTENSION, BENIGN 03/28/2009  . PALPITATIONS 03/28/2009  . SNORING 03/28/2009   Outpatient Encounter Prescriptions as of 07/11/2016  Medication Sig  . amitriptyline (ELAVIL) 25 MG tablet Take 0.5 tablets (12.5 mg total) by mouth at bedtime. For sleep  . aspirin 81 MG tablet Take 81 mg by mouth daily.  . carvedilol (COREG) 6.25 MG tablet Take 1 and 1/2 tablets by  mouth two times daily with  a meal  . diltiazem (CARDIZEM CD) 120 MG 24 hr capsule Take 1 capsule (120 mg total) by mouth daily.  . Iron-Vit C-Vit B12-Folic Acid (IRON 665 PLUS PO) Take 1 tablet by mouth every morning.   . meloxicam (MOBIC) 15 MG tablet Take 15 mg by mouth as needed for pain.  . Multiple Vitamin (MULTI-VITAMIN PO) Take by mouth.  . pravastatin (PRAVACHOL) 40 MG tablet Take 1 tablet (40 mg total) by mouth daily.  Marland Kitchen VIRT-VITE 2.5-25-1 MG TABS tablet TAKE 1 TABLET BY MOUTH  DAILY   No facility-administered encounter medications on file as of 07/11/2016.       Review of Systems  Constitutional: Negative.   Musculoskeletal: Positive for arthralgias.  Neurological: Negative.        Objective:   Physical Exam    Constitutional: He appears well-developed and well-nourished.  Musculoskeletal:  Left foot: Normal appearance. Normal temperature. There is no erythema. Nontender to palpation. He is able to walk on his toes and heels but has more pain walking on his toes. X-ray shows some degenerative changes, some sesamoid bones of the forefoot, and a calcification at the insertion of the Achilles tendon behind the calcaneus.   BP 131/79   Pulse 76   Temp 97.6 F (36.4 C) (Oral)   Ht '6\' 2"'$  (1.88 m)   Wt 251 lb (113.9 kg)   BMI 32.23 kg/m         Assessment & Plan:  1. Pain of joint of left ankle and foot Unexplained pain in the foot. I do not think findings or symptoms are consistent with gout, plantar fasciitis, heel spur,. There are some mild degenerative changes and we talked about using meloxicam for more extended period but he is concerned of the effectiveness might have on his heart. As we discussed treatment he brought up the issue of numbness or tingling in his arm. This really sounds like some neuropathy and he thinks may be the pain in his foot and leg symptoms could be similarly neuropathy. We will try gabapentin 300 mg and titrate upward.  Wardell Honour MD - DG Foot Complete Left; Future

## 2016-08-11 ENCOUNTER — Other Ambulatory Visit: Payer: Self-pay | Admitting: Cardiology

## 2016-08-11 ENCOUNTER — Other Ambulatory Visit: Payer: Self-pay | Admitting: Family Medicine

## 2016-09-11 DIAGNOSIS — R030 Elevated blood-pressure reading, without diagnosis of hypertension: Secondary | ICD-10-CM | POA: Diagnosis not present

## 2016-09-11 DIAGNOSIS — J111 Influenza due to unidentified influenza virus with other respiratory manifestations: Secondary | ICD-10-CM | POA: Diagnosis not present

## 2016-09-11 DIAGNOSIS — J00 Acute nasopharyngitis [common cold]: Secondary | ICD-10-CM | POA: Diagnosis not present

## 2016-09-17 DIAGNOSIS — J181 Lobar pneumonia, unspecified organism: Secondary | ICD-10-CM | POA: Diagnosis not present

## 2016-09-17 DIAGNOSIS — R0602 Shortness of breath: Secondary | ICD-10-CM | POA: Diagnosis not present

## 2016-09-17 DIAGNOSIS — J209 Acute bronchitis, unspecified: Secondary | ICD-10-CM | POA: Diagnosis not present

## 2016-09-17 DIAGNOSIS — R05 Cough: Secondary | ICD-10-CM | POA: Diagnosis not present

## 2016-09-24 ENCOUNTER — Ambulatory Visit: Payer: Medicare Other | Admitting: Family Medicine

## 2016-09-25 ENCOUNTER — Ambulatory Visit: Payer: Medicare Other

## 2016-10-02 ENCOUNTER — Ambulatory Visit: Payer: Medicare Other

## 2016-10-02 DIAGNOSIS — G8929 Other chronic pain: Secondary | ICD-10-CM | POA: Diagnosis not present

## 2016-10-02 DIAGNOSIS — M25511 Pain in right shoulder: Secondary | ICD-10-CM | POA: Diagnosis not present

## 2016-10-04 ENCOUNTER — Ambulatory Visit (INDEPENDENT_AMBULATORY_CARE_PROVIDER_SITE_OTHER): Payer: Medicare Other | Admitting: *Deleted

## 2016-10-04 ENCOUNTER — Ambulatory Visit: Payer: Medicare Other

## 2016-10-04 VITALS — BP 145/87 | HR 62 | Ht 74.0 in | Wt 251.0 lb

## 2016-10-04 DIAGNOSIS — Z0000 Encounter for general adult medical examination without abnormal findings: Secondary | ICD-10-CM

## 2016-10-04 DIAGNOSIS — R002 Palpitations: Secondary | ICD-10-CM | POA: Diagnosis not present

## 2016-10-04 NOTE — Patient Instructions (Addendum)
  Mr. Witzke ,  Thank you for taking time to come for your Medicare Wellness Visit. I appreciate your ongoing commitment to your health goals. Please review the following plan we discussed and let me know if I can assist you in the future.   Keep follow up appointment with Dr Livia Snellen in 10/2016. Call back if you need to be seen sooner.   Can use simethicone (Gas-X) for gas and colace or docusate 1 to 3 times daily for constipation.   Will request an appointment with Dr [name]. If you haven't heard from their office within a week please contact them at [phone].  Bring a copy of your Advanced Directives [employer] Power of Attorney and Living Will) to put in your chart.   These are the goals we discussed: Goals    . Exercise 3x per week (30 min per time)          Continue to exercise regularly at least 3 times per week    . Weight (lb) < 230 lb (104.3 kg)       This is a list of the screening recommended for you and due dates:  Health Maintenance  Topic Date Due  .  Hepatitis C: One time screening is recommended by Center for Disease Control  (CDC) for  adults born from [birth year] through 1964.   20-Mar-1946  . Colon Cancer Screening  11/03/2024  . Tetanus Vaccine  02/22/2025  . Flu Shot  Completed  . Shingles Vaccine  Completed  . Pneumonia vaccines  Completed

## 2016-10-04 NOTE — Progress Notes (Addendum)
Subjective:   Travis Padilla is a 71 y.o. male who presents for an Initial Medicare Annual Wellness Visit. Travis Padilla lives at home with his wife. He is a retired Chief Financial Officer and enjoys gardening, biking, and boating.   Review of Systems  Reports that his health is about the same as last year.  Cardiac Risk Factors include: sedentary lifestyle;hypertension;male gender. Has noticed an increase in palpitations recently. Last visit with cardio was in 03/2016. Told to f/u in a year.  Musculoskeletal right shoulder pain and decreased ROM. Seeing ortho, Dr Veverly Fells. Recently had steroid injection.   GI-Some problems with constipation  Other systems negative.   Objective:    Today's Vitals   10/04/16 0936 10/04/16 1035  BP: (!) 161/93 (!) 145/87  Pulse: 66 62  Weight: 251 lb (113.9 kg)   Height: '6\' 2"'$  (1.88 m)    Body mass index is 32.23 kg/m.  Current Medications (verified) Outpatient Encounter Prescriptions as of 10/04/2016  Medication Sig  . amitriptyline (ELAVIL) 25 MG tablet Take 0.5 tablets (12.5 mg total) by mouth at bedtime. For sleep  . aspirin 81 MG tablet Take 81 mg by mouth daily.  . carvedilol (COREG) 6.25 MG tablet Take 1 and 1/2 tablets by  mouth two times daily with  a meal  . diltiazem (CARDIZEM CD) 120 MG 24 hr capsule Take 1 capsule (120 mg total) by mouth daily.  . Iron-Vit C-Vit B12-Folic Acid (IRON 102 PLUS PO) Take 1 tablet by mouth every morning.   . meloxicam (MOBIC) 15 MG tablet Take 15 mg by mouth as needed for pain.  . Multiple Vitamin (MULTI-VITAMIN PO) Take by mouth.  . pravastatin (PRAVACHOL) 40 MG tablet Take 1 tablet (40 mg total) by mouth daily.  Marland Kitchen VIRT-VITE 2.5-25-1 MG TABS tablet TAKE 1 TABLET BY MOUTH  DAILY  . [DISCONTINUED] diltiazem (CARDIZEM CD) 120 MG 24 hr capsule TAKE 1 CAPSULE (120 MG TOTAL) BY MOUTH DAILY.  . [DISCONTINUED] gabapentin (NEURONTIN) 300 MG capsule TAKE 1 CAPSULE (300 MG TOTAL) BY MOUTH AT BEDTIME.   No facility-administered  encounter medications on file as of 10/04/2016.     Allergies (verified) Trazodone and nefazodone   History: Past Medical History:  Diagnosis Date  . Abnormal nuclear stress test    December, 2013  . Anemia   . CAD (coronary artery disease)    Mild nonobstructive plaque in cath 2013  . Chest pain    December, 2013  . Dizziness   . Fungus infection    in ear   . Gout   . Hemorrhoid   . Hyperlipidemia   . Hypertension   . Skin cancer   . Spinal stenosis    Past Surgical History:  Procedure Laterality Date  . BELPHAROPTOSIS REPAIR Bilateral   . CATARACT EXTRACTION W/ INTRAOCULAR LENS  IMPLANT, BILATERAL    . COLONOSCOPY N/A 11/03/2014   Procedure: COLONOSCOPY;  Surgeon: Rogene Houston, MD;  [facility];  Service: Endoscopy;  Laterality: N/A;  1225  . KNEE CARTILAGE SURGERY Left    Left knee  . SKIN CANCER EXCISION  12/2014, 04/25/15   Family History  Problem Relation Age of Onset  . Heart attack Father   . Heart failure Mother   . Healthy Sister   . Skin cancer Sister   . Neuropathy Brother   . COPD Brother   . Epilepsy Brother   . Healthy Sister   . Healthy Sister   . Colon cancer Neg Hx  Social History   Occupational History  . Government social research officer    Social History Main Topics  . Smoking status: Former Smoker    Packs/day: 0.20    Years: 2.00    Types: Cigarettes    Start date: 10/01/1968  . Smokeless tobacco: Never Used  . Alcohol use No     Comment: hx of   . Drug use: No  . Sexual activity: Not on file   Tobacco Counseling No tobacco use  Activities of Daily Living In your present state of health, do you have any difficulty performing the following activities: 10/04/2016 04/30/2016  Hearing? Y N  Vision? Y N  Difficulty concentrating or making decisions? N N  Walking or climbing stairs? N N  Dressing or bathing? N N  Doing errands, shopping? N N  Preparing Food and eating ? N -  Using the Toilet? N -  In the past six months, have  you accidently leaked urine? N -  Do you have problems with loss of bowel control? N -  Managing your Medications? N -  Managing your Finances? N -  Housekeeping or managing your Housekeeping? N -  Some recent data might be hidden    Immunizations and Health Maintenance Immunization History  Administered Date(s) Administered  . Influenza, High Dose Seasonal PF 08/03/2014, 05/29/2015  . Influenza-Unspecified 06/29/2016  . Pneumococcal Conjugate-13 03/30/2015  . Pneumococcal Polysaccharide-23 07/21/2012  . Td 02/23/2015   Health Maintenance Due  Topic Date Due  . Hepatitis C Screening  06-Mar-1946    Patient Care Team: Claretta Fraise, MD as PCP - General (Family Medicine) Daneil Dolin, MD as Consulting Physician (Gastroenterology) Griselda Miner, MD as Consulting Physician (Dermatology) Netta Cedars, MD as Consulting Physician (Orthopedic Surgery) Gwendalyn Ege, MD as Referring Physician (Ophthalmology) Rogene Houston, MD as Consulting Physician (Gastroenterology)  Indicate any recent Medical Services you may have received from other than [facility] providers in the past year (date may be approximate).    Assessment:   This is a routine wellness examination for Travis Padilla.   Hearing/Vision screen No deficits noted during exam. Patient does report some decrease in hearing. Not interested in evaluation at this time. Vision is 20/20 although he does have some problems with peripheral vision s/p cataract surgery.   Dietary issues and exercise activities discussed: Current Exercise Habits: Structured exercise class;Home exercise routine, Type of exercise: walking;yoga;strength training/weights;Other - see comments (spin class), Time (Minutes): > 60, Frequency (Times/Week): 2, Weekly Exercise (Minutes/Week): 0, Intensity: Moderate, Exercise limited by: orthopedic condition(s)  Patient goes to the [facility] twice a week for 1.5 hours where he has a spin class and works with weights. Has done yoga  some as well. Also walks at in neighborhood almost daily.   Goals    . Exercise 3x per week (30 min per time)          Continue to exercise regularly at least 3 times per week    . Weight (lb) < 230 lb (104.3 kg)      Depression Screen PHQ 2/9 Scores 10/04/2016 07/11/2016 04/30/2016 01/31/2016  PHQ - 2 Score 0 1 0 0  PHQ- 9 Score - - - -    Fall Risk Fall Risk  10/04/2016 04/30/2016 01/31/2016 10/31/2015 09/07/2015  Falls in the past year? No No No No No    Cognitive Function: MMSE - Mini Mental State Exam 10/04/2016  Orientation to time 5  Orientation to Place 5  Registration 3  Attention/ Calculation 5  Recall 2  Language- name 2 objects 2  Language- repeat 1  Language- follow 3 step command 3  Language- read & follow direction 1  Write a sentence 1  Copy design 1  Total score 29      Normal exam  Screening Tests Health Maintenance  Topic Date Due  . Hepatitis C Screening  1945-12-21  . COLONOSCOPY  11/03/2024  . TETANUS/TDAP  02/22/2025  . INFLUENZA VACCINE  Completed  . ZOSTAVAX  Completed  . PNA vac Low Risk Adult  Completed        Plan:    Keep follow up appointment with Dr Livia Snellen in 10/2016. Call back if you need to be seen sooner.   Can use simethicone (Gas-X) for gas and colace or docusate 1 to 3 times daily for constipation.   Will request an appointment with Dr [name]. If you haven't heard from their office within a week please contact them at [phone].  Bring a copy of your Advanced Directives [employer] Power of Attorney and Living Will) to put in your chart.    During the course of the visit Zacarias was educated and counseled about the following appropriate screening and preventive services:   Vaccines to include Pneumoccal-up to date, Influenza-up to date, Td-up to date, Zostavax-up to date  Electrocardiogram-  Colorectal cancer screening- last done 2016  Cardiovascular disease screening-sees Dr [name]   Diabetes screening-with routine  labs  Glaucoma screening-Done yearly. Last visit 10/2015  Nutrition counseling  Patient Instructions (the written plan) were given to the patient.   Chong Sicilian, RN   10/04/2016    I have reviewed and agree with the above AWV documentation.  Claretta Fraise, M.D.

## 2016-10-29 DIAGNOSIS — M25511 Pain in right shoulder: Secondary | ICD-10-CM | POA: Diagnosis not present

## 2016-10-29 DIAGNOSIS — G8929 Other chronic pain: Secondary | ICD-10-CM | POA: Diagnosis not present

## 2016-11-05 DIAGNOSIS — L910 Hypertrophic scar: Secondary | ICD-10-CM | POA: Diagnosis not present

## 2016-11-05 DIAGNOSIS — D2272 Melanocytic nevi of left lower limb, including hip: Secondary | ICD-10-CM | POA: Diagnosis not present

## 2016-11-05 DIAGNOSIS — Z85828 Personal history of other malignant neoplasm of skin: Secondary | ICD-10-CM | POA: Diagnosis not present

## 2016-11-05 DIAGNOSIS — L918 Other hypertrophic disorders of the skin: Secondary | ICD-10-CM | POA: Diagnosis not present

## 2016-11-05 DIAGNOSIS — D225 Melanocytic nevi of trunk: Secondary | ICD-10-CM | POA: Diagnosis not present

## 2016-11-05 DIAGNOSIS — Z85820 Personal history of malignant melanoma of skin: Secondary | ICD-10-CM | POA: Diagnosis not present

## 2016-11-05 DIAGNOSIS — L814 Other melanin hyperpigmentation: Secondary | ICD-10-CM | POA: Diagnosis not present

## 2016-11-05 DIAGNOSIS — L821 Other seborrheic keratosis: Secondary | ICD-10-CM | POA: Diagnosis not present

## 2016-11-05 DIAGNOSIS — D2261 Melanocytic nevi of right upper limb, including shoulder: Secondary | ICD-10-CM | POA: Diagnosis not present

## 2016-11-05 DIAGNOSIS — C44619 Basal cell carcinoma of skin of left upper limb, including shoulder: Secondary | ICD-10-CM | POA: Diagnosis not present

## 2016-11-06 ENCOUNTER — Ambulatory Visit: Payer: Medicare Other | Admitting: Family Medicine

## 2016-11-06 DIAGNOSIS — M25511 Pain in right shoulder: Secondary | ICD-10-CM | POA: Diagnosis not present

## 2016-11-06 DIAGNOSIS — G8929 Other chronic pain: Secondary | ICD-10-CM | POA: Diagnosis not present

## 2016-11-12 ENCOUNTER — Other Ambulatory Visit: Payer: Self-pay | Admitting: Cardiology

## 2016-11-12 MED ORDER — PRAVASTATIN SODIUM 40 MG PO TABS: 40.0000 mg | ORAL_TABLET | Freq: Every day | ORAL | 1 refills | Status: DC

## 2016-11-12 MED ORDER — CARVEDILOL 6.25 MG PO TABS: ORAL_TABLET | ORAL | 1 refills | Status: DC

## 2016-11-12 NOTE — Telephone Encounter (Signed)
Rx(s) sent to pharmacy electronically.  

## 2016-11-12 NOTE — Telephone Encounter (Signed)
New message    *STAT* If patient is at the pharmacy, call can be transferred to refill team.   1. Which medications need to be refilled? (please list name of each medication and dose if known) carvedilol (COREG) 6.25 MG tablet pravastatin (PRAVACHOL) 40 MG tablet 2. Which pharmacy/location (including street and city if local pharmacy) is medication to be sent to?[facility] [place], [place]  3. Do they need a 30 day or 90 day supply? 90 day supply

## 2016-11-13 ENCOUNTER — Ambulatory Visit (INDEPENDENT_AMBULATORY_CARE_PROVIDER_SITE_OTHER): Payer: Medicare Other | Admitting: Family Medicine

## 2016-11-13 ENCOUNTER — Encounter: Payer: Self-pay | Admitting: Family Medicine

## 2016-11-13 VITALS — BP 116/67 | HR 65 | Temp 97.8°F | Ht 74.0 in | Wt 244.0 lb

## 2016-11-13 DIAGNOSIS — R002 Palpitations: Secondary | ICD-10-CM | POA: Diagnosis not present

## 2016-11-13 DIAGNOSIS — I10 Essential (primary) hypertension: Secondary | ICD-10-CM

## 2016-11-13 DIAGNOSIS — E782 Mixed hyperlipidemia: Secondary | ICD-10-CM | POA: Diagnosis not present

## 2016-11-13 DIAGNOSIS — D519 Vitamin B12 deficiency anemia, unspecified: Secondary | ICD-10-CM | POA: Diagnosis not present

## 2016-11-13 MED ORDER — CARVEDILOL 12.5 MG PO TABS: ORAL_TABLET | ORAL | 1 refills | Status: DC

## 2016-11-13 NOTE — Progress Notes (Signed)
Subjective:  Patient ID: MIKEAL WINSTANLEY, male    DOB: 06-22-1946  Age: 71 y.o. MRN: [id number]  CC: Hypertension (pt here today for his routine follow up on his HTN)   HPI CLEARANCE CHENAULT presents for  follow-up of hypertension. Patient has no history of headache chest pain or shortness of breath or recent cough. Patient also denies symptoms of TIA such as numbness weakness lateralizing. Patient checks  blood pressure [facility], and has not had any elevated readings recently. Patient denies side effects from medication. States taking it regularly.Patient has been wondering if he still needs to take the [place]. He is getting that as a generic. It is fairly expensive. He wonders if it is that important. He is not having any side effects. Patient also reports having palpitations continuing. He has been under treatment with Dr. Archie Patten for that. He still gets some lightheadedness on occasion when he gets the palpitations. He is concerned also that perhaps the medicine is causing the palpitations. He was recently given a prescription for Cardizem 128 to take daily as well. He continues carvedilol at 6.25 mg 1.5 tablets twice a day.   History [place] has a past medical history of Abnormal nuclear stress test; Anemia; CAD (coronary artery disease); Chest pain; Dizziness; Fungus infection; Gout; Hemorrhoid; Hyperlipidemia; Hypertension; Skin cancer; and Spinal stenosis.   He has a past surgical history that includes Knee cartilage surgery (Left); Cataract extraction w/ intraocular lens  implant, bilateral; Blepharoptosis repair (Bilateral); Colonoscopy (N/A, 11/03/2014); and Skin cancer excision (12/2014, 04/25/15).   His family history includes COPD in his brother; Epilepsy in his brother; Healthy in his sister, sister, and sister; Heart attack in his father; Heart failure in his mother; Neuropathy in his brother; Skin cancer in his sister.He reports that he has quit smoking. His smoking use included  Cigarettes. He started smoking about 48 years ago. He has a 0.40 pack-year smoking history. He has never used smokeless tobacco. He reports that he does not drink alcohol or use drugs.  Current Outpatient Prescriptions on File Prior to Visit  Medication Sig Dispense Refill  . amitriptyline (ELAVIL) 25 MG tablet Take 0.5 tablets (12.5 mg total) by mouth at bedtime. For sleep 90 tablet 0  . aspirin 81 MG tablet Take 81 mg by mouth daily.    Marland Kitchen diltiazem (CARDIZEM CD) 120 MG 24 hr capsule Take 1 capsule (120 mg total) by mouth daily. 90 capsule 2  . Iron-Vit C-Vit B12-Folic Acid (IRON 944 PLUS PO) Take 1 tablet by mouth every morning.     . meloxicam (MOBIC) 15 MG tablet Take 15 mg by mouth as needed for pain.    . Multiple Vitamin (MULTI-VITAMIN PO) Take by mouth.    . pravastatin (PRAVACHOL) 40 MG tablet Take 1 tablet (40 mg total) by mouth daily. 90 tablet 1  . VIRT-VITE 2.5-25-1 MG TABS tablet TAKE 1 TABLET BY MOUTH  DAILY 90 tablet 1   No current facility-administered medications on file prior to visit.     ROS Review of Systems  Constitutional: Negative for chills, diaphoresis, fever and unexpected weight change.  HENT: Negative for congestion, hearing loss, rhinorrhea and sore throat.   Eyes: Negative for visual disturbance.  Respiratory: Negative for cough and shortness of breath.   Cardiovascular: Positive for palpitations. Negative for chest pain.  Gastrointestinal: Negative for abdominal pain, constipation and diarrhea.  Genitourinary: Negative for dysuria and flank pain.  Musculoskeletal: Negative for arthralgias and joint swelling.  Skin:  Negative for rash.  Neurological: Positive for dizziness (not orthostatic bby hx) and light-headedness. Negative for headaches.  Psychiatric/Behavioral: Negative for dysphoric mood and sleep disturbance.    Objective:  BP 116/67   Pulse 65   Temp 97.8 F (36.6 C) (Oral)   Ht 6' 2" (1.88 m)   Wt 244 lb (110.7 kg)   BMI 31.33 kg/m    BP Readings from Last 3 Encounters:  11/13/16 116/67  10/04/16 (!) 145/87  07/11/16 131/79    Wt Readings from Last 3 Encounters:  11/13/16 244 lb (110.7 kg)  10/04/16 251 lb (113.9 kg)  07/11/16 251 lb (113.9 kg)     Physical Exam  Constitutional: He is oriented to person, place, and time. He appears well-developed and well-nourished. No distress.  HENT:  Head: Normocephalic and atraumatic.  Right Ear: External ear normal.  Left Ear: External ear normal.  Nose: Nose normal.  Mouth/Throat: Oropharynx is clear and moist.  Eyes: Conjunctivae and EOM are normal. Pupils are equal, round, and reactive to light.  Neck: Normal range of motion. Neck supple. No thyromegaly present.  Cardiovascular: Normal rate and normal heart sounds.  An irregular rhythm present. Exam reveals no gallop.   No murmur heard. Pulmonary/Chest: Effort normal and breath sounds normal. No respiratory distress. He has no wheezes. He has no rales.  Abdominal: Soft. Bowel sounds are normal. He exhibits no distension. There is no tenderness.  Lymphadenopathy:    He has no cervical adenopathy.  Neurological: He is alert and oriented to person, place, and time. He has normal reflexes.  Skin: Skin is warm and dry.  Psychiatric: He has a normal mood and affect. His behavior is normal. Judgment and thought content normal.     Lab Results  Component Value Date   WBC 4.8 05/09/2016   HGB 13.2 (A) 03/30/2015   HCT 38.4 05/09/2016   PLT 183 05/09/2016   GLUCOSE 96 05/09/2016   CHOL 119 05/09/2016   TRIG 93 05/09/2016   HDL 35 (L) 05/09/2016   LDLCALC 65 05/09/2016   ALT 14 05/09/2016   AST 16 05/09/2016   NA 141 05/09/2016   K 4.3 05/09/2016   CL 104 05/09/2016   CREATININE 0.99 05/09/2016   BUN 14 05/09/2016   CO2 24 05/09/2016   TSH 4.650 (H) 04/30/2016   PSA 2.2 08/04/2014   INR 1.1 (H) 08/21/2012    Dg Abd 1 View  Result Date: 05/02/2015 CLINICAL DATA:  Pre lithotripsy, right-sided stone per  patient. EXAM: ABDOMEN - 1 VIEW COMPARISON:  CT 04/18/2015 FINDINGS: Bowel gas pattern is nonobstructive. There are no abnormal calcifications projected over the kidneys. There are multiple calcific densities over the right pelvis most of which likely represent phleboliths. Remainder of the exam is unremarkable. IMPRESSION: Nonobstructive bowel gas pattern. No definite renal stones present. Electronically Signed   By: Marin Olp M.D.   On: 05/02/2015 10:46    Assessment & Plan:   Leiland was seen today for hypertension.  Diagnoses and all orders for this visit:  Essential hypertension, benign -     CMP14+EGFR -     Homocysteine  Mixed hyperlipidemia -     CMP14+EGFR -     Lipid panel  Anemia due to vitamin B12 deficiency, unspecified B12 deficiency type -     CBC with Differential/Platelet -     CMP14+EGFR -     Vitamin B12 -     Folate -     Homocysteine  Palpitations -  CBC with Differential/Platelet -     CMP14+EGFR -     EKG 12-Lead -     Vitamin B12 -     Folate -     Homocysteine  Other orders -     carvedilol (COREG) 12.5 MG tablet; Take 1 and 1/2 tablets by mouth two times daily with a meal   I have changed Mr. Vandunk carvedilol. I am also having him maintain his Iron-Vit C-Vit B12-Folic Acid (IRON 366 PLUS PO), Multiple Vitamin (MULTI-VITAMIN PO), aspirin, amitriptyline, meloxicam, VIRT-VITE, diltiazem, and pravastatin.  EKG shows 2 PVCs which are from different foci. Follow-up: Return in about 6 months (around 05/16/2017). Allergies as of 11/13/2016      Reactions   Trazodone And Nefazodone Palpitations      Medication List       Accurate as of 11/13/16  4:07 PM. Always use your most recent med list.          amitriptyline 25 MG tablet Commonly known as:  ELAVIL Take 0.5 tablets (12.5 mg total) by mouth at bedtime. For sleep   aspirin 81 MG tablet Take 81 mg by mouth daily.   carvedilol 12.5 MG tablet Commonly known as:  COREG Take 1 and 1/2  tablets by mouth two times daily with a meal   diltiazem 120 MG 24 hr capsule Commonly known as:  CARDIZEM CD Take 1 capsule (120 mg total) by mouth daily.   IRON 100 PLUS PO Take 1 tablet by mouth every morning.   meloxicam 15 MG tablet Commonly known as:  MOBIC Take 15 mg by mouth as needed for pain.   MULTI-VITAMIN PO Take by mouth.   pravastatin 40 MG tablet Commonly known as:  PRAVACHOL Take 1 tablet (40 mg total) by mouth daily.   VIRT-VITE 2.5-25-1 MG Tabs tablet Generic drug:  Folic Acid-Vit Y4-IHK V42 TAKE 1 TABLET BY MOUTH  DAILY       Claretta Fraise, M.D.

## 2016-11-14 ENCOUNTER — Ambulatory Visit: Payer: Medicare Other | Admitting: Cardiology

## 2016-11-14 LAB — CBC WITH DIFFERENTIAL/PLATELET
BASOS ABS: 0 10*3/uL (ref 0.0–0.2)
Basos: 1 %
EOS (ABSOLUTE): 0.2 10*3/uL (ref 0.0–0.4)
EOS: 3 %
Hematocrit: 38.5 % (ref 37.5–51.0)
Hemoglobin: 13.3 g/dL (ref 13.0–17.7)
IMMATURE GRANULOCYTES: 0 %
Immature Grans (Abs): 0 10*3/uL (ref 0.0–0.1)
LYMPHS ABS: 1.6 10*3/uL (ref 0.7–3.1)
Lymphs: 27 %
MCH: 30.3 pg (ref 26.6–33.0)
MCHC: 34.5 g/dL (ref 31.5–35.7)
MCV: 88 fL (ref 79–97)
MONOS ABS: 0.8 10*3/uL (ref 0.1–0.9)
Monocytes: 14 %
NEUTROS PCT: 55 %
Neutrophils Absolute: 3.4 10*3/uL (ref 1.4–7.0)
PLATELETS: 216 10*3/uL (ref 150–379)
RBC: 4.39 x10E6/uL (ref 4.14–5.80)
RDW: 13.7 % (ref 12.3–15.4)
WBC: 6.1 10*3/uL (ref 3.4–10.8)

## 2016-11-14 LAB — CMP14+EGFR
ALK PHOS: 56 IU/L (ref 39–117)
ALT: 14 IU/L (ref 0–44)
AST: 16 IU/L (ref 0–40)
Albumin/Globulin Ratio: 1.4 (ref 1.2–2.2)
Albumin: 4.2 g/dL (ref 3.6–4.8)
BUN/Creatinine Ratio: 17 (ref 10–24)
BUN: 16 mg/dL (ref 8–27)
Bilirubin Total: 0.6 mg/dL (ref 0.0–1.2)
CALCIUM: 9.2 mg/dL (ref 8.6–10.2)
CO2: 23 mmol/L (ref 18–29)
CREATININE: 0.95 mg/dL (ref 0.76–1.27)
Chloride: 102 mmol/L (ref 96–106)
GFR calc Af Amer: 94 mL/min/{1.73_m2} (ref >59)
GFR, EST NON AFRICAN AMERICAN: 81 mL/min/{1.73_m2} (ref >59)
GLOBULIN, TOTAL: 3.1 g/dL (ref 1.5–4.5)
GLUCOSE: 93 mg/dL (ref 65–99)
POTASSIUM: 4.8 mmol/L (ref 3.5–5.2)
SODIUM: 140 mmol/L (ref 134–144)
Total Protein: 7.3 g/dL (ref 6.0–8.5)

## 2016-11-14 LAB — LIPID PANEL
Chol/HDL Ratio: 3.1 ratio (ref 0.0–5.0)
Cholesterol, Total: 125 mg/dL (ref 100–199)
HDL: 40 mg/dL
LDL Calculated: 72 mg/dL (ref 0–99)
Triglycerides: 66 mg/dL (ref 0–149)
VLDL Cholesterol Cal: 13 mg/dL (ref 5–40)

## 2016-11-14 LAB — FOLATE: Folate: >20 ng/mL

## 2016-11-14 LAB — HOMOCYSTEINE: Homocysteine: 8.6 umol/L (ref 0.0–15.0)

## 2016-11-14 LAB — VITAMIN B12: Vitamin B-12: 501 pg/mL (ref 232–1245)

## 2016-11-15 ENCOUNTER — Encounter: Payer: Self-pay | Admitting: Family Medicine

## 2016-11-15 MED ORDER — AMITRIPTYLINE HCL 25 MG PO TABS: 12.5000 mg | ORAL_TABLET | Freq: Every day | ORAL | 1 refills | Status: DC

## 2016-11-16 ENCOUNTER — Other Ambulatory Visit: Payer: Self-pay | Admitting: Family Medicine

## 2016-11-16 MED ORDER — CARVEDILOL 12.5 MG PO TABS: 12.5000 mg | ORAL_TABLET | Freq: Two times a day (BID) | ORAL | 1 refills | Status: DC

## 2016-11-28 NOTE — Progress Notes (Signed)
HPI The patient presents for followup of palpitations.   Since I last saw him he he has had the sensation like his heart is beating hard in his chest. It is not fast or irregular. He was noted recently on EKG to have a couple of PVCs that were interpolated. I reviewed this EKG from March 6. He had his dose of carvedilol increased and he's had no symptomatic palpitations. However, he really wasn't feeling these PVCs that day. The sensation that he has is happening today and constantly and is just a strong heartbeat in his chest. He feels a little lightheaded. His blood pressure was actually well controlled today when he was having these sensations in his EKG as below was unremarkable. He's not having any presyncope or syncope. He's not having any shortness of breath, PND or orthopnea. He's had no weight gain or edema.   Allergies  Allergen Reactions  . Trazodone And Nefazodone Palpitations    Current Outpatient Prescriptions  Medication Sig Dispense Refill  . amitriptyline (ELAVIL) 25 MG tablet Take 0.5 tablets (12.5 mg total) by mouth at bedtime. For sleep 90 tablet 1  . aspirin 81 MG tablet Take 81 mg by mouth daily.    . carvedilol (COREG) 12.5 MG tablet Take 1 tablet (12.5 mg total) by mouth 2 (two) times daily with a meal. Take 1 and 1/2 tablets by mouth two times daily with a meal 180 tablet 1  . diltiazem (CARDIZEM CD) 120 MG 24 hr capsule Take 1 capsule (120 mg total) by mouth daily. 90 capsule 3  . Iron-Vit C-Vit B12-Folic Acid (IRON 161 PLUS PO) Take 1 tablet by mouth every morning.     . meloxicam (MOBIC) 15 MG tablet Take 15 mg by mouth as needed for pain.    . Multiple Vitamin (MULTI-VITAMIN PO) Take by mouth.    . pravastatin (PRAVACHOL) 40 MG tablet Take 1 tablet (40 mg total) by mouth daily. 90 tablet 1  . VIRT-VITE 2.5-25-1 MG TABS tablet TAKE 1 TABLET BY MOUTH  DAILY 90 tablet 1   No current facility-administered medications for this visit.     Past Medical History:    Diagnosis Date  . Abnormal nuclear stress test    December, 2013  . Anemia   . CAD (coronary artery disease)    Mild nonobstructive plaque in cath 2013  . Chest pain    December, 2013  . Dizziness   . Fungus infection    in ear   . Gout   . Hemorrhoid   . Hyperlipidemia   . Hypertension   . Skin cancer   . Spinal stenosis     Past Surgical History:  Procedure Laterality Date  . BELPHAROPTOSIS REPAIR Bilateral   . CATARACT EXTRACTION W/ INTRAOCULAR LENS  IMPLANT, BILATERAL    . COLONOSCOPY N/A 11/03/2014   Procedure: COLONOSCOPY;  Surgeon: Rogene Houston, MD;  Location: [facility];  Service: Endoscopy;  Laterality: N/A;  1225  . KNEE CARTILAGE SURGERY Left    Left knee  . SKIN CANCER EXCISION  12/2014, 04/25/15    ROS:   Otherwise as stated in the HPI and negative for all other systems  PHYSICAL EXAM BP 134/79 (BP Location: Left Arm)   Pulse 62   Ht '6\' 2"'$  (1.88 m)   Wt 245 lb 9.6 oz (111.4 kg)   BMI 31.53 kg/m  GENERAL:  Well appearing NECK:  No jugular venous distention, waveform within normal limits, carotid upstroke brisk and  symmetric, no bruits, no thyromegaly LUNGS:  Clear to auscultation bilaterally CHEST:  Unremarkable HEART:  PMI not displaced or sustained,S1 and S2 within normal limits, no S3, no S4, no clicks, no rubs, no murmurs ABD:  Flat, positive bowel sounds normal in frequency in pitch, no bruits, no rebound, no guarding, no midline pulsatile mass, no hepatomegaly, no splenomegaly EXT:  2 plus pulses throughout, no edema, no cyanosis no clubbing.   EKG:  NSR, NGT leftward axis.  Intervals within normal limits, no acute ST-T wave changes. 11/30/2016  ASSESSMENT AND PLAN  CAD -   He has nonobstructive disease. He will continue with risk reduction. He started taking aspirin 81 mg daily.  His symptoms are atypical. No cardiovascular testing is indicated.  ESSENTIAL HYPERTENSION, BENIGN -  The blood pressure is at target. No change in medications is  indicated. We will continue with therapeutic lifestyle changes (TLC).    PALPITATIONS -   These are rare. No change in therapy is indicated.   The strong heartbeats that he is experiencing not associated with any dysrhythmia as he has an EKG while having the symptoms and this demonstrates normal sinus rhythm. Unfortunately are not clear on exactly what these might be I do think he is doing well on a higher dose of carvedilol and should continue this. He'll let me know if these worsen.  CARDIOMYOPATHY  This is very mild. I will follow this clinically.  last EF was low normal  DYSLIPIDEMIA -    He is doing well on his pravastatin and his LDL was 52 when last checked. He will remain on the meds as listed.

## 2016-11-29 ENCOUNTER — Encounter: Payer: Self-pay | Admitting: Cardiology

## 2016-11-29 ENCOUNTER — Ambulatory Visit (INDEPENDENT_AMBULATORY_CARE_PROVIDER_SITE_OTHER): Payer: Medicare Other | Admitting: Cardiology

## 2016-11-29 VITALS — BP 134/79 | HR 62 | Ht 74.0 in | Wt 245.6 lb

## 2016-11-29 DIAGNOSIS — R002 Palpitations: Secondary | ICD-10-CM | POA: Diagnosis not present

## 2016-11-29 MED ORDER — DILTIAZEM HCL ER COATED BEADS 120 MG PO CP24: 120.0000 mg | ORAL_CAPSULE | Freq: Every day | ORAL | 3 refills | Status: DC

## 2016-11-29 NOTE — Patient Instructions (Signed)

## 2016-11-30 ENCOUNTER — Encounter: Payer: Self-pay | Admitting: Cardiology

## 2016-12-26 ENCOUNTER — Ambulatory Visit: Payer: Medicare Other | Admitting: Cardiology

## 2017-05-08 ENCOUNTER — Other Ambulatory Visit: Payer: Self-pay | Admitting: Cardiology

## 2017-05-08 ENCOUNTER — Other Ambulatory Visit: Payer: Self-pay | Admitting: Family Medicine

## 2017-05-28 ENCOUNTER — Ambulatory Visit (INDEPENDENT_AMBULATORY_CARE_PROVIDER_SITE_OTHER): Payer: Medicare Other | Admitting: Family Medicine

## 2017-05-28 ENCOUNTER — Encounter: Payer: Self-pay | Admitting: Family Medicine

## 2017-05-28 VITALS — BP 115/65 | HR 63 | Temp 98.1°F | Ht 74.0 in | Wt 246.0 lb

## 2017-05-28 DIAGNOSIS — E782 Mixed hyperlipidemia: Secondary | ICD-10-CM | POA: Diagnosis not present

## 2017-05-28 DIAGNOSIS — R42 Dizziness and giddiness: Secondary | ICD-10-CM

## 2017-05-28 DIAGNOSIS — R531 Weakness: Secondary | ICD-10-CM | POA: Diagnosis not present

## 2017-05-28 NOTE — Patient Instructions (Signed)
Metamucil 1 tablespoon Twice daily.

## 2017-05-28 NOTE — Progress Notes (Signed)
Subjective:  Patient ID: Travis Padilla, male    DOB: 1946-06-22  Age: 71 y.o. MRN: [id number]  CC: Follow-up (pt here today c/o being lightheaded,flushed feeling and visual disturbances. )   HPI  Travis Padilla presents for sx noted above. Chronic. Worsening. Lightheaded ith standing, walking. Not with laying down, Vision blurs periodically. No loss of consciousness. Onset several months ago.   Depression screen [facility] 2/9 05/28/2017 11/13/2016 10/04/2016  Decreased Interest 0 0 0  Down, Depressed, Hopeless 0 0 0  PHQ - 2 Score 0 0 0  Altered sleeping - - -  Tired, decreased energy - - -  Change in appetite - - -  Feeling bad or failure about yourself  - - -  Trouble concentrating - - -  Moving slowly or fidgety/restless - - -  Suicidal thoughts - - -  PHQ-9 Score - - -    History Travis Padilla has a past medical history of Abnormal nuclear stress test; Anemia; CAD (coronary artery disease); Chest pain; Dizziness; Fungus infection; Gout; Hemorrhoid; Hyperlipidemia; Hypertension; Skin cancer; and Spinal stenosis.   He has a past surgical history that includes Knee cartilage surgery (Left); Cataract extraction w/ intraocular lens  implant, bilateral; Blepharoptosis repair (Bilateral); Colonoscopy (N/A, 11/03/2014); and Skin cancer excision (12/2014, 04/25/15).   His family history includes COPD in his brother; Epilepsy in his brother; Healthy in his sister, sister, and sister; Heart attack in his father; Heart failure in his mother; Neuropathy in his brother; Skin cancer in his sister.He reports that he has quit smoking. His smoking use included Cigarettes. He started smoking about 48 years ago. He has a 0.40 pack-year smoking history. He has never used smokeless tobacco. He reports that he does not drink alcohol or use drugs.    ROS Review of Systems  Constitutional: Positive for fatigue. Negative for chills, diaphoresis, fever and unexpected weight change.  HENT: Negative for congestion, hearing  loss, rhinorrhea and sore throat.   Eyes: Positive for visual disturbance.  Respiratory: Negative for cough and shortness of breath.   Cardiovascular: Negative for chest pain.  Gastrointestinal: Negative for abdominal pain, constipation and diarrhea.  Genitourinary: Negative for dysuria and flank pain.  Musculoskeletal: Negative for arthralgias and joint swelling.  Skin: Negative for rash.  Neurological: Positive for dizziness and light-headedness. Negative for headaches.  Psychiatric/Behavioral: Negative for dysphoric mood and sleep disturbance.    Objective:  BP 115/65   Pulse 63   Temp 98.1 F (36.7 C) (Oral)   Ht '6\' 2"'  (1.88 m)   Wt 246 lb (111.6 kg)   BMI 31.58 kg/m   BP Readings from Last 3 Encounters:  05/28/17 115/65  11/29/16 134/79  11/13/16 116/67    Wt Readings from Last 3 Encounters:  05/28/17 246 lb (111.6 kg)  11/29/16 245 lb 9.6 oz (111.4 kg)  11/13/16 244 lb (110.7 kg)     Physical Exam  Constitutional: He is oriented to person, place, and time. He appears well-developed and well-nourished. No distress.  HENT:  Head: Normocephalic and atraumatic.  Right Ear: External ear normal.  Left Ear: External ear normal.  Nose: Nose normal.  Mouth/Throat: Oropharynx is clear and moist.  Eyes: Pupils are equal, round, and reactive to light. Conjunctivae and EOM are normal.  Neck: Normal range of motion. Neck supple. No thyromegaly present.  Cardiovascular: Normal rate, regular rhythm and normal heart sounds.   No murmur heard. Pulmonary/Chest: Effort normal and breath sounds normal. No respiratory distress. He has no wheezes.  He has no rales.  Abdominal: Soft. Bowel sounds are normal. He exhibits no distension. There is no tenderness.  Lymphadenopathy:    He has no cervical adenopathy.  Neurological: He is alert and oriented to person, place, and time. He has normal reflexes.  Skin: Skin is warm and dry.  Psychiatric: He has a normal mood and affect. His  behavior is normal. Judgment and thought content normal.      Assessment & Plan:   Travis Padilla was seen today for follow-up.  Diagnoses and all orders for this visit:  Weakness with dizziness -     CBC with Differential/Platelet -     CMP14+EGFR -     Lipid panel -     Magnesium -     Phosphorus -     Ambulatory referral to ENT -     EKG 12-Lead -     Holter monitor - 48 hour; Future  Mixed hyperlipidemia -     Lipid panel  Other orders -     Cancel: eszopiclone (LUNESTA) 2 MG TABS tablet; Take 1 tablet (2 mg total) by mouth at bedtime as needed for sleep. Take immediately before bedtime       I have discontinued Travis Padilla Iron-Vit C-Vit B12-Folic Acid (IRON 336 PLUS PO), VIRT-VITE, and amitriptyline. I am also having him maintain his Multiple Vitamin (MULTI-VITAMIN PO), aspirin, meloxicam, diltiazem, carvedilol, and pravastatin.  Allergies as of 05/28/2017      Reactions   Trazodone And Nefazodone Palpitations      Medication List       Accurate as of 05/28/17 10:54 PM. Always use your most recent med list.          aspirin 81 MG tablet Take 81 mg by mouth daily.   carvedilol 12.5 MG tablet Commonly known as:  COREG TAKE 1 AND 1/2 TABLETS BY MOUTH TWO TIMES DAILY WITH A MEAL   diltiazem 120 MG 24 hr capsule Commonly known as:  CARDIZEM CD Take 1 capsule (120 mg total) by mouth daily.   meloxicam 15 MG tablet Commonly known as:  MOBIC Take 15 mg by mouth as needed for pain.   MULTI-VITAMIN PO Take by mouth.   pravastatin 40 MG tablet Commonly known as:  PRAVACHOL TAKE 1 TABLET (40 MG TOTAL) BY MOUTH DAILY.            Discharge Care Instructions        Start     Ordered   05/28/17 0000  CBC with Differential/Platelet     05/28/17 1332   05/28/17 0000  CMP14+EGFR     05/28/17 1332   05/28/17 0000  Lipid panel     05/28/17 1332   05/28/17 0000  Magnesium     05/28/17 1332   05/28/17 0000  Phosphorus     05/28/17 1332   05/28/17 0000   Ambulatory referral to ENT     05/28/17 1332   05/28/17 0000  EKG 12-Lead     05/28/17 1332   05/28/17 0000  Holter monitor - 48 hour     05/28/17 1332       Follow-up: Return in about 6 weeks (around 07/09/2017).  Travis Padilla, M.D.

## 2017-05-29 LAB — CMP14+EGFR
A/G RATIO: 1.4 (ref 1.2–2.2)
ALK PHOS: 64 IU/L (ref 39–117)
ALT: 12 IU/L (ref 0–44)
AST: 17 IU/L (ref 0–40)
Albumin: 4.2 g/dL (ref 3.6–4.8)
BUN/Creatinine Ratio: 10 (ref 10–24)
BUN: 10 mg/dL (ref 8–27)
Bilirubin Total: 0.4 mg/dL (ref 0.0–1.2)
CALCIUM: 9 mg/dL (ref 8.6–10.2)
CO2: 25 mmol/L (ref 20–29)
Chloride: 106 mmol/L (ref 96–106)
Creatinine, Ser: 1.01 mg/dL (ref 0.76–1.27)
GFR calc Af Amer: 87 mL/min/{1.73_m2} (ref >59)
GFR, EST NON AFRICAN AMERICAN: 76 mL/min/{1.73_m2} (ref >59)
GLOBULIN, TOTAL: 3 g/dL (ref 1.5–4.5)
Glucose: 80 mg/dL (ref 65–99)
POTASSIUM: 4.9 mmol/L (ref 3.5–5.2)
Sodium: 142 mmol/L (ref 134–144)
TOTAL PROTEIN: 7.2 g/dL (ref 6.0–8.5)

## 2017-05-29 LAB — CBC WITH DIFFERENTIAL/PLATELET
Basophils Absolute: 0 10*3/uL (ref 0.0–0.2)
Basos: 0 %
EOS (ABSOLUTE): 0.2 10*3/uL (ref 0.0–0.4)
EOS: 4 %
HEMATOCRIT: 38.1 % (ref 37.5–51.0)
Hemoglobin: 12.6 g/dL — ABNORMAL LOW (ref 13.0–17.7)
IMMATURE GRANULOCYTES: 0 %
Immature Grans (Abs): 0 10*3/uL (ref 0.0–0.1)
LYMPHS ABS: 1.9 10*3/uL (ref 0.7–3.1)
Lymphs: 29 %
MCH: 29.4 pg (ref 26.6–33.0)
MCHC: 33.1 g/dL (ref 31.5–35.7)
MCV: 89 fL (ref 79–97)
MONOS ABS: 0.7 10*3/uL (ref 0.1–0.9)
Monocytes: 10 %
NEUTROS PCT: 57 %
Neutrophils Absolute: 3.7 10*3/uL (ref 1.4–7.0)
Platelets: 200 10*3/uL (ref 150–379)
RBC: 4.28 x10E6/uL (ref 4.14–5.80)
RDW: 13.5 % (ref 12.3–15.4)
WBC: 6.5 10*3/uL (ref 3.4–10.8)

## 2017-05-29 LAB — LIPID PANEL
CHOL/HDL RATIO: 4.1 ratio (ref 0.0–5.0)
CHOLESTEROL TOTAL: 127 mg/dL (ref 100–199)
HDL: 31 mg/dL — AB (ref >39)
LDL Calculated: 64 mg/dL (ref 0–99)
TRIGLYCERIDES: 162 mg/dL — AB (ref 0–149)
VLDL Cholesterol Cal: 32 mg/dL (ref 5–40)

## 2017-05-29 LAB — PHOSPHORUS: Phosphorus: 2.9 mg/dL (ref 2.5–4.5)

## 2017-05-29 LAB — MAGNESIUM: Magnesium: 2 mg/dL (ref 1.6–2.3)

## 2017-06-26 ENCOUNTER — Telehealth: Payer: Self-pay | Admitting: Cardiology

## 2017-06-26 DIAGNOSIS — R42 Dizziness and giddiness: Secondary | ICD-10-CM | POA: Insufficient documentation

## 2017-06-26 DIAGNOSIS — H43813 Vitreous degeneration, bilateral: Secondary | ICD-10-CM | POA: Diagnosis not present

## 2017-06-26 DIAGNOSIS — I951 Orthostatic hypotension: Secondary | ICD-10-CM | POA: Diagnosis not present

## 2017-06-26 NOTE — Telephone Encounter (Signed)
Dr [name] spoke with Dr Winfield Rast about pt.

## 2017-06-27 ENCOUNTER — Ambulatory Visit: Payer: Medicare Other | Admitting: *Deleted

## 2017-06-27 ENCOUNTER — Telehealth: Payer: Self-pay | Admitting: Family Medicine

## 2017-06-27 DIAGNOSIS — R42 Dizziness and giddiness: Secondary | ICD-10-CM

## 2017-06-27 DIAGNOSIS — R531 Weakness: Secondary | ICD-10-CM

## 2017-06-27 NOTE — Telephone Encounter (Signed)
Spoke with pt to inform we do have holter monitor available Answered pt's questions regarding monitor Pt verbalizes understanding

## 2017-06-27 NOTE — Progress Notes (Signed)
holter monitor placed Asset  [phone]

## 2017-06-28 DIAGNOSIS — R531 Weakness: Secondary | ICD-10-CM | POA: Diagnosis not present

## 2017-07-02 ENCOUNTER — Ambulatory Visit: Payer: Medicare Other | Admitting: Family Medicine

## 2017-07-02 NOTE — Telephone Encounter (Signed)
Please ask this patient to get an Alive Cor.  We need to record these episodes.

## 2017-07-03 ENCOUNTER — Telehealth: Payer: Self-pay | Admitting: Cardiology

## 2017-07-03 NOTE — Telephone Encounter (Signed)
Pt is aware of the AliveCor, pt stated he wear a monitor for 40 hour and will have office faxed over result.

## 2017-07-03 NOTE — Telephone Encounter (Signed)
Returning your call. °

## 2017-07-03 NOTE — Telephone Encounter (Signed)
Leave message for pt to call back 

## 2017-07-05 ENCOUNTER — Other Ambulatory Visit: Payer: Self-pay | Admitting: Family Medicine

## 2017-07-09 ENCOUNTER — Telehealth: Payer: Self-pay | Admitting: *Deleted

## 2017-07-09 DIAGNOSIS — H04123 Dry eye syndrome of bilateral lacrimal glands: Secondary | ICD-10-CM | POA: Diagnosis not present

## 2017-07-09 NOTE — Telephone Encounter (Signed)
Pt

## 2017-07-22 ENCOUNTER — Ambulatory Visit (INDEPENDENT_AMBULATORY_CARE_PROVIDER_SITE_OTHER): Payer: Medicare Other | Admitting: Family Medicine

## 2017-07-22 ENCOUNTER — Encounter: Payer: Self-pay | Admitting: Family Medicine

## 2017-07-22 VITALS — BP 107/63 | HR 75 | Temp 97.4°F | Ht 74.0 in | Wt 250.0 lb

## 2017-07-22 DIAGNOSIS — I10 Essential (primary) hypertension: Secondary | ICD-10-CM

## 2017-07-22 DIAGNOSIS — R531 Weakness: Secondary | ICD-10-CM | POA: Diagnosis not present

## 2017-07-22 DIAGNOSIS — R42 Dizziness and giddiness: Secondary | ICD-10-CM

## 2017-07-22 DIAGNOSIS — Z23 Encounter for immunization: Secondary | ICD-10-CM | POA: Diagnosis not present

## 2017-07-22 NOTE — Progress Notes (Signed)
Subjective:  Patient ID: Travis Padilla, male    DOB: [birth year]  Age: 71 y.o. MRN: [id number]  CC: Follow-up (2 mo  - dizziness and weakness)   HPI [name] presents for resolution of his dizziness and weakness.  He says he has not felt it for about 3 or 4 weeks.  However he is very concerned about his heart due to ENT feeling an irregular irregular rhythm when they were checking him in the office a few weeks ago.  Additionally he had a Holter monitor performed.  That was reviewed and found to not show any significant arrhythmia.  However it is in route to be scanned and unavailable this afternoon.  Patient denies palpitations chest pain shortness of breath.  He has no weakness.  Depression screen Travis Padilla 2/9 07/22/2017 05/28/2017 11/13/2016  Decreased Interest 0 0 0  Down, Depressed, Hopeless 0 0 0  PHQ - 2 Score 0 0 0  Altered sleeping - - -  Tired, decreased energy - - -  Change in appetite - - -  Feeling bad or failure about yourself  - - -  Trouble concentrating - - -  Moving slowly or fidgety/restless - - -  Suicidal thoughts - - -  PHQ-9 Score - - -    History [name] has a past medical history of Abnormal nuclear stress test, Anemia, CAD (coronary artery disease), Chest pain, Dizziness, Fungus infection, Gout, Hemorrhoid, Hyperlipidemia, Hypertension, Skin cancer, and Spinal stenosis.   He has a past surgical history that includes Knee cartilage surgery (Left); Cataract extraction w/ intraocular lens  implant, bilateral; Blepharoptosis repair (Bilateral); Skin cancer excision (12/2014, 04/25/15); and COLONOSCOPY (N/A, 11/03/2014).   His family history includes COPD in his brother; Epilepsy in his brother; Healthy in his sister, sister, and sister; Heart attack in his father; Heart failure in his mother; Neuropathy in his brother; Skin cancer in his sister.He reports that he has quit smoking. His smoking use included cigarettes. He started smoking about 48 years ago. He has a 0.40  pack-year smoking history. he has never used smokeless tobacco. He reports that he does not drink alcohol or use drugs.    ROS Review of Systems  Constitutional: Negative for chills, diaphoresis, fever and unexpected weight change.  HENT: Negative for congestion, hearing loss, rhinorrhea and sore throat.   Eyes: Negative for visual disturbance.  Respiratory: Negative for cough and shortness of breath.   Cardiovascular: Negative for chest pain.  Gastrointestinal: Negative for abdominal pain, constipation and diarrhea.  Genitourinary: Negative for dysuria and flank pain.  Musculoskeletal: Negative for arthralgias and joint swelling.  Skin: Negative for rash.  Neurological: Negative for dizziness and headaches.  Psychiatric/Behavioral: Negative for dysphoric mood and sleep disturbance.    Objective:  BP 107/63 (BP Location: Right Arm)   Pulse 75   Temp (!) 97.4 F (36.3 C) (Oral)   Ht 6\' 2"  (1.88 m)   Wt 250 lb (113.4 kg)   BMI 32.10 kg/m   BP Readings from Last 3 Encounters:  07/22/17 107/63  05/28/17 115/65  11/29/16 134/79    Wt Readings from Last 3 Encounters:  07/22/17 250 lb (113.4 kg)  05/28/17 246 lb (111.6 kg)  11/29/16 245 lb 9.6 oz (111.4 kg)     Physical Exam  Constitutional: He is oriented to person, place, and time. He appears well-developed and well-nourished.  HENT:  Head: Normocephalic and atraumatic.  Right Ear: External ear normal.  Left Ear: External ear normal.  Mouth/Throat:  No oropharyngeal exudate or posterior oropharyngeal erythema.  Eyes: Pupils are equal, round, and reactive to light.  Neck: Normal range of motion. Neck supple.  Cardiovascular: Normal rate and regular rhythm.  No murmur heard. Pulmonary/Chest: Breath sounds normal. No respiratory distress.  Neurological: He is alert and oriented to person, place, and time.  Vitals reviewed.     Assessment & Plan:   [name] was seen today for follow-up.  Diagnoses and all orders for  this visit:  Weakness with dizziness  Encounter for immunization -     Flu vaccine HIGH DOSE PF  Essential hypertension, benign     The weakness and dizziness are quiescent for the moment.  He will report back to the office if they recur.  In the meantime I encouraged him to follow-up with Dr. [name] to make sure there is no cardiac etiology that has been missed.  I am having [name] maintain his Multiple Vitamin (MULTI-VITAMIN PO), aspirin, meloxicam, diltiazem, pravastatin, and carvedilol.  Allergies as of 07/22/2017      Reactions   Trazodone And Nefazodone Palpitations      Medication List        Accurate as of 07/22/17  5:08 PM. Always use your most recent med list.          aspirin 81 MG tablet Take 81 mg by mouth daily.   carvedilol 12.5 MG tablet Commonly known as:  COREG TAKE 1 AND 1/2 TABLETS BY MOUTH TWO TIMES DAILY WITH A MEAL   diltiazem 120 MG 24 hr capsule Commonly known as:  CARDIZEM CD Take 1 capsule (120 mg total) by mouth daily.   meloxicam 15 MG tablet Commonly known as:  MOBIC Take 15 mg by mouth as needed for pain.   MULTI-VITAMIN PO Take by mouth.   pravastatin 40 MG tablet Commonly known as:  PRAVACHOL TAKE 1 TABLET (40 MG TOTAL) BY MOUTH DAILY.        Follow-up: Return in about 6 months (around 01/19/2018).  Claretta Fraise, M.D.

## 2017-09-04 ENCOUNTER — Other Ambulatory Visit: Payer: Self-pay | Admitting: Family Medicine

## 2017-09-10 HISTORY — PX: OTHER SURGICAL HISTORY: SHX169

## 2017-10-28 ENCOUNTER — Other Ambulatory Visit: Payer: Self-pay

## 2017-10-28 MED ORDER — CARVEDILOL 12.5 MG PO TABS: ORAL_TABLET | ORAL | 0 refills | Status: DC

## 2017-10-29 ENCOUNTER — Other Ambulatory Visit: Payer: Self-pay | Admitting: Cardiology

## 2017-10-29 NOTE — Telephone Encounter (Signed)
Rx(s) sent to pharmacy electronically.  

## 2017-11-21 ENCOUNTER — Other Ambulatory Visit: Payer: Self-pay | Admitting: Cardiology

## 2017-12-04 ENCOUNTER — Encounter: Payer: Self-pay | Admitting: Pediatrics

## 2017-12-04 ENCOUNTER — Ambulatory Visit (INDEPENDENT_AMBULATORY_CARE_PROVIDER_SITE_OTHER): Payer: Medicare Other | Admitting: Pediatrics

## 2017-12-04 VITALS — BP 130/83 | HR 60 | Temp 97.1°F | Ht 74.0 in | Wt 248.2 lb

## 2017-12-04 DIAGNOSIS — E782 Mixed hyperlipidemia: Secondary | ICD-10-CM

## 2017-12-04 DIAGNOSIS — J3089 Other allergic rhinitis: Secondary | ICD-10-CM | POA: Diagnosis not present

## 2017-12-04 DIAGNOSIS — E039 Hypothyroidism, unspecified: Secondary | ICD-10-CM

## 2017-12-04 DIAGNOSIS — R7989 Other specified abnormal findings of blood chemistry: Secondary | ICD-10-CM | POA: Diagnosis not present

## 2017-12-04 DIAGNOSIS — I10 Essential (primary) hypertension: Secondary | ICD-10-CM | POA: Diagnosis not present

## 2017-12-04 DIAGNOSIS — D519 Vitamin B12 deficiency anemia, unspecified: Secondary | ICD-10-CM | POA: Diagnosis not present

## 2017-12-04 MED ORDER — FLUTICASONE PROPIONATE 50 MCG/ACT NA SUSP: 2.0000 | Freq: Every day | NASAL | 6 refills | Status: DC

## 2017-12-04 NOTE — Progress Notes (Signed)
  Subjective:   Patient ID: Travis Padilla, male    DOB: 08/20/1946, 72 y.o.   MRN: [id number] CC: Follow-up multiple med problems HPI: Travis Padilla is a 72 y.o. male presenting for Follow-up  Feels extra heart beats at times. Not any worse. Holter monitor with 5% ventricular ectopy 06/2017.  Anemia: taking iron   Thinks he has had noisy breathing, wheezing at times. Doesn't get worse with exertion  HTN: taking meds regularly  Slightly elevated TSH last check.  No CP with exertion. Taking cholesterol medicine regularly  Relevant past medical, surgical, family and social history reviewed. Allergies and medications reviewed and updated. Social History   Tobacco Use  Smoking Status Former Smoker  . Packs/day: 0.20  . Years: 2.00  . Pack years: 0.40  . Types: Cigarettes  . Start date: 10/01/1968  Smokeless Tobacco Never Used   ROS: Per HPI   Objective:    BP 130/83   Pulse 60   Temp (!) 97.1 F (36.2 C) (Oral)   Ht _0  (1.88 m)   Wt 248 lb 3.2 oz (112.6 kg)   BMI 31.87 kg/m   Wt Readings from Last 3 Encounters:  12/04/17 248 lb 3.2 oz (112.6 kg)  07/22/17 250 lb (113.4 kg)  05/28/17 246 lb (111.6 kg)    Gen: NAD, alert, cooperative with exam, NCAT EYES: EOMI, no conjunctival injection, or no icterus ENT:  TMs pearly gray b/l, OP without erythema LYMPH: no cervical LAD CV: NRRR, normal S1/S2, no murmur, distal pulses 2+ b/l Resp: CTABL, no wheezes, normal WOB Abd: +BS, soft, NTND. no guarding or organomegaly Ext: No edema, warm Neuro: Alert and oriented, strength equal b/l UE and LE, coordination grossly normal MSK: normal muscle bulk  Assessment & Plan:  [name] was seen today for follow-up multiple med problems.  Diagnoses and all orders for this visit:  Allergic rhinitis due to other allergic trigger, unspecified seasonality -     fluticasone (FLONASE) 50 MCG/ACT nasal spray; Place 2 sprays into both nostrils daily.  Mixed hyperlipidemia Stable, cont  statin.  Anemia due to vitamin B12 deficiency, unspecified B12 deficiency type -     CBC with Differential/Platelet -     Anemia Profile B  Essential hypertension, benign -     BMP8+EGFR  Abnormal thyroid blood test Recheck TSH, not on medicine. Borderline last check.  Hypothyroidism, unspecified type -     TSH   Follow up plan: Return in about 3 months (around 03/06/2018). Assunta Found, MD [place]

## 2017-12-05 LAB — ANEMIA PROFILE B
BASOS ABS: 0 10*3/uL (ref 0.0–0.2)
Basos: 0 %
EOS (ABSOLUTE): 0.2 10*3/uL (ref 0.0–0.4)
Eos: 3 %
FERRITIN: 83 ng/mL (ref 30–400)
Folate: 12.9 ng/mL (ref >3.0)
Hematocrit: 39.6 % (ref 37.5–51.0)
Hemoglobin: 13 g/dL (ref 13.0–17.7)
IMMATURE GRANS (ABS): 0 10*3/uL (ref 0.0–0.1)
Immature Granulocytes: 1 %
Iron Saturation: 36 % (ref 15–55)
Iron: 108 ug/dL (ref 38–169)
LYMPHS: 28 %
Lymphocytes Absolute: 1.8 10*3/uL (ref 0.7–3.1)
MCH: 30.2 pg (ref 26.6–33.0)
MCHC: 32.8 g/dL (ref 31.5–35.7)
MCV: 92 fL (ref 79–97)
MONOCYTES: 11 %
MONOS ABS: 0.7 10*3/uL (ref 0.1–0.9)
NEUTROS ABS: 3.6 10*3/uL (ref 1.4–7.0)
Neutrophils: 57 %
PLATELETS: 218 10*3/uL (ref 150–379)
RBC: 4.31 x10E6/uL (ref 4.14–5.80)
RDW: 13.8 % (ref 12.3–15.4)
RETIC CT PCT: 1.1 % (ref 0.6–2.6)
Total Iron Binding Capacity: 299 ug/dL (ref 250–450)
UIBC: 191 ug/dL (ref 111–343)
VITAMIN B 12: 349 pg/mL (ref 232–1245)
WBC: 6.4 10*3/uL (ref 3.4–10.8)

## 2017-12-05 LAB — TSH: TSH: 4.33 u[IU]/mL (ref 0.450–4.500)

## 2017-12-05 LAB — BMP8+EGFR
BUN/Creatinine Ratio: 16 (ref 10–24)
BUN: 17 mg/dL (ref 8–27)
CALCIUM: 9.4 mg/dL (ref 8.6–10.2)
CO2: 24 mmol/L (ref 20–29)
Chloride: 106 mmol/L (ref 96–106)
Creatinine, Ser: 1.04 mg/dL (ref 0.76–1.27)
GFR, EST AFRICAN AMERICAN: 84 mL/min/{1.73_m2} (ref >59)
GFR, EST NON AFRICAN AMERICAN: 72 mL/min/{1.73_m2} (ref >59)
Glucose: 86 mg/dL (ref 65–99)
POTASSIUM: 5.1 mmol/L (ref 3.5–5.2)
SODIUM: 141 mmol/L (ref 134–144)

## 2017-12-25 ENCOUNTER — Other Ambulatory Visit: Payer: Self-pay | Admitting: Family Medicine

## 2018-01-21 DIAGNOSIS — L738 Other specified follicular disorders: Secondary | ICD-10-CM | POA: Diagnosis not present

## 2018-01-21 DIAGNOSIS — Z85828 Personal history of other malignant neoplasm of skin: Secondary | ICD-10-CM | POA: Diagnosis not present

## 2018-01-21 DIAGNOSIS — C44311 Basal cell carcinoma of skin of nose: Secondary | ICD-10-CM | POA: Diagnosis not present

## 2018-01-31 ENCOUNTER — Encounter: Payer: Self-pay | Admitting: Pediatrics

## 2018-01-31 NOTE — Telephone Encounter (Signed)
Called pt back. Out of the blue will feel like he is having a hard time breathing, getting air in and out. No chest pain or pressure with episodes[facility], weights, stretches. No trouble breathing then. Taking flonase, has helped some with nasal congestion and opening up nasal passages. Has had more allergy symptoms. Has an appointment to see pulmonologist in [facility] upcoming. Is going to try daily cetirizine.

## 2018-02-04 ENCOUNTER — Other Ambulatory Visit: Payer: Self-pay | Admitting: Cardiology

## 2018-02-04 ENCOUNTER — Other Ambulatory Visit: Payer: Self-pay | Admitting: *Deleted

## 2018-02-04 MED ORDER — PRAVASTATIN SODIUM 40 MG PO TABS: 40.0000 mg | ORAL_TABLET | Freq: Every day | ORAL | 0 refills | Status: DC

## 2018-02-04 NOTE — Telephone Encounter (Signed)
°*  STAT* If patient is at the pharmacy, call can be transferred to refill team.   1. Which medications need to be refilled? (please list name of each medication and dose if known) pt is in [facility] like his medicine called there-Pravastatin and Diltiazem-please let pt know when you have called this in  2. Which pharmacy/location (including street and city if local pharmacy) is medication to be sent to?[facility] [address]  3. Do they need a 30 day or 90 day supply? [place]

## 2018-02-05 MED ORDER — DILTIAZEM HCL ER COATED BEADS 120 MG PO CP24: 120.0000 mg | ORAL_CAPSULE | Freq: Every day | ORAL | 0 refills | Status: DC

## 2018-02-13 ENCOUNTER — Other Ambulatory Visit: Payer: Self-pay | Admitting: Cardiology

## 2018-02-13 NOTE — Telephone Encounter (Signed)
Rx request sent to pharmacy.  

## 2018-02-19 ENCOUNTER — Ambulatory Visit: Payer: Medicare Other | Admitting: Cardiology

## 2018-02-19 DIAGNOSIS — R062 Wheezing: Secondary | ICD-10-CM | POA: Diagnosis not present

## 2018-02-19 DIAGNOSIS — R0600 Dyspnea, unspecified: Secondary | ICD-10-CM | POA: Diagnosis not present

## 2018-02-28 DIAGNOSIS — C44311 Basal cell carcinoma of skin of nose: Secondary | ICD-10-CM | POA: Diagnosis not present

## 2018-03-04 ENCOUNTER — Encounter: Payer: Self-pay | Admitting: Pediatrics

## 2018-03-11 DIAGNOSIS — R0600 Dyspnea, unspecified: Secondary | ICD-10-CM | POA: Diagnosis not present

## 2018-03-11 DIAGNOSIS — R062 Wheezing: Secondary | ICD-10-CM | POA: Diagnosis not present

## 2018-03-26 ENCOUNTER — Ambulatory Visit (INDEPENDENT_AMBULATORY_CARE_PROVIDER_SITE_OTHER): Payer: Medicare Other

## 2018-03-26 ENCOUNTER — Encounter: Payer: Self-pay | Admitting: Pediatrics

## 2018-03-26 ENCOUNTER — Ambulatory Visit (INDEPENDENT_AMBULATORY_CARE_PROVIDER_SITE_OTHER): Payer: Medicare Other | Admitting: Pediatrics

## 2018-03-26 VITALS — BP 129/79 | HR 65 | Temp 97.5°F | Resp 20 | Ht 74.0 in | Wt 229.4 lb

## 2018-03-26 DIAGNOSIS — R062 Wheezing: Secondary | ICD-10-CM | POA: Diagnosis not present

## 2018-03-26 DIAGNOSIS — G8929 Other chronic pain: Secondary | ICD-10-CM

## 2018-03-26 DIAGNOSIS — M25572 Pain in left ankle and joints of left foot: Secondary | ICD-10-CM | POA: Diagnosis not present

## 2018-03-26 DIAGNOSIS — M19072 Primary osteoarthritis, left ankle and foot: Secondary | ICD-10-CM | POA: Diagnosis not present

## 2018-03-26 DIAGNOSIS — I5189 Other ill-defined heart diseases: Secondary | ICD-10-CM | POA: Diagnosis not present

## 2018-03-26 NOTE — Progress Notes (Signed)
  Subjective:   Patient ID: Travis Padilla, male    DOB: [birth year], 72 y.o.   MRN: [id number] CC: Medical Management of Chronic Issues  HPI: Travis Padilla is a 72 y.o. male   Wheezing: Ongoing for the last few months.  He was seen by pulmonology in [place], has normal spirometry, negative methacholine challenge. Per chart documentation had an expiratory and expiratory wheeze at time of examination.  Plan was to do trial of Symbicort, patient was not able to afford so he has not tried.  He has had good exercise tolerance, he continues to be active, up to 45 minutes to an hour and a half most days.  He says he does not seem limited by his breathing.  His left ankle has been slightly swollen over the last 2 to 3 weeks.  No injury that he knows of.  Certain moves and positions with his exercise classes tend to bother him more than others.  No redness.  Relevant past medical, surgical, family and social history reviewed. Allergies and medications reviewed and updated. Social History   Tobacco Use  Smoking Status Former Smoker  . Packs/day: 0.20  . Years: 2.00  . Pack years: 0.40  . Types: Cigarettes  . Start date: 10/01/1968  Smokeless Tobacco Never Used   ROS: Per HPI   Objective:    BP 129/79   Pulse 65   Temp (!) 97.5 F (36.4 C) (Oral)   Resp 20   Ht 6\' 2"  (1.88 m)   Wt 229 lb 6.4 oz (104.1 kg)   SpO2 98%   BMI 29.45 kg/m   Wt Readings from Last 3 Encounters:  03/26/18 229 lb 6.4 oz (104.1 kg)  12/04/17 248 lb 3.2 oz (112.6 kg)  07/22/17 250 lb (113.4 kg)    Gen: NAD, alert, cooperative with exam, NCAT EYES: EOMI, no conjunctival injection, or no icterus ENT:  TMs pearly gray b/l, OP without erythema LYMPH: no cervical LAD CV: NRRR, normal S1/S2, no murmur, distal pulses 2+ b/l Resp: inspiratory and expiratory wheeze present throughout, no crackles, comfortable WOB Abd: +BS, soft, NTND. no guarding or organomegaly Ext: No pitting  edema, warm Neuro: Alert and  oriented MSK: Left lateral ankle slightly swollen compared to right.  No redness.  No tenderness to palpation.  Normal and equal calf sizes bilaterally.  Assessment & Plan:  Travis Padilla was seen today for medical management of chronic issues.  Diagnoses and all orders for this visit:  Wheezing Samples given of Symbicort for trial, to continue plan per pulmonology.  Patient has appointment next week to follow-up with cardiology.  Pulmonology mentioned possibility of cardiac wheeze.  Will order echocardiogram.  Patient does have history of grade 1 diastolic dysfunction.  Systolic function has been as low was 45% few years ago, most recent echo with improved EF of 50 to 55%.   -     ECHOCARDIOGRAM COMPLETE; Future  Chronic pain of left ankle We will get x-ray.  Next steps would be evaluation by orthopedics if not continue to heal. -     DG Ankle Complete Left; Future  Diastolic dysfunction   Follow up plan: Return in about 2 months (around 05/27/2018). Assunta Found, MD [place]

## 2018-03-28 ENCOUNTER — Telehealth: Payer: Self-pay | Admitting: Pediatrics

## 2018-03-28 NOTE — Telephone Encounter (Signed)
Tried to reach patient earlier this afternoon left message, tried again just now.  I do not remember coughing up blood being a symptom we discussed at his recent office visit.  If he is coughing up blood he may need to be seen in the emergency room because it can be very serious.

## 2018-03-31 ENCOUNTER — Encounter: Payer: Self-pay | Admitting: Cardiology

## 2018-03-31 ENCOUNTER — Encounter: Payer: Self-pay | Admitting: Pediatrics

## 2018-03-31 DIAGNOSIS — R042 Hemoptysis: Secondary | ICD-10-CM

## 2018-03-31 NOTE — Progress Notes (Signed)
HPI The patient presents for followup of palpitations.  Last year he wore a Holter that demonstrated PVCs and ventricular couplets.  Since I last saw him he is not been bothered further by the PVCs.  He still has palpitations but are not particularly symptomatic.  His biggest issue has been wheezing.  He is had a work-up of this and is trying to get an appointment with pulmonary.  He is not describing PND or orthopnea.  Is not describing cough.  There is some dyspnea with exertion.  He is not having any new chest pressure, neck or arm discomfort.  He has had no weight gain or edema.  Probably his biggest complaint is fatigue.   Allergies  Allergen Reactions  . Trazodone And Nefazodone Palpitations    Current Outpatient Medications  Medication Sig Dispense Refill  . aspirin 81 MG tablet Take 81 mg by mouth daily.    . carvedilol (COREG) 12.5 MG tablet TAKE 1 AND 1/2 TABLETS BY MOUTH TWO TIMES DAILY WITH A MEAL 270 tablet 3  . IRON, FERROUS GLUCONATE, PO Take by mouth daily.    . pravastatin (PRAVACHOL) 40 MG tablet Take 1 tablet (40 mg total) by mouth daily. 90 tablet 3  . budesonide-formoterol (SYMBICORT) 160-4.5 MCG/ACT inhaler Inhale 2 puffs into the lungs 2 (two) times daily.      No current facility-administered medications for this visit.     Past Medical History:  Diagnosis Date  . Abnormal nuclear stress test    December, 2013  . Anemia   . CAD (coronary artery disease)    Mild nonobstructive plaque in cath 2013  . Chest pain    December, 2013  . Dizziness   . Gout   . Hemorrhoid   . Hyperlipidemia   . Hypertension   . Skin cancer   . Spinal stenosis     Past Surgical History:  Procedure Laterality Date  . BELPHAROPTOSIS REPAIR Bilateral   . CATARACT EXTRACTION W/ INTRAOCULAR LENS  IMPLANT, BILATERAL    . COLONOSCOPY N/A 11/03/2014   Procedure: COLONOSCOPY;  Surgeon: Rogene Houston, MD;  Location: [facility];  Service: Endoscopy;  Laterality: N/A;  1225    . KNEE CARTILAGE SURGERY Left    Left knee  . SKIN CANCER EXCISION  12/2014, 04/25/15    ROS:   As stated in the HPI and negative for all other systems.  PHYSICAL EXAM BP 112/80   Pulse 60   Ht 6\' 2"  (1.88 m)   Wt 241 lb (109.3 kg)   BMI 30.94 kg/m   GENERAL:  Well appearing NECK:  No jugular venous distention, waveform within normal limits, carotid upstroke brisk and symmetric, no bruits, no thyromegaly LUNGS: Decreased breath sounds with diffuse wheezing CHEST:  Unremarkable HEART:  PMI not displaced or sustained,S1 and S2 within normal limits, no S3, no S4, no clicks, no rubs, no murmurs ABD:  Flat, positive bowel sounds normal in frequency in pitch, no bruits, no rebound, no guarding, no midline pulsatile mass, no hepatomegaly, no splenomegaly EXT:  2 plus pulses throughout, no edema, no cyanosis no clubbing   EKG: Sinus rhythm, rate 72, axis leftward, premature contraction, no acute ST-T wave changes.. 04/02/2018   Lab Results  Component Value Date   CHOL 127 05/28/2017   TRIG 162 (H) 05/28/2017   HDL 31 (L) 05/28/2017   LDLCALC 64 05/28/2017    ASSESSMENT AND PLAN  CAD -  He has had no new chest  pain.  He will continue with risk reduction.  No further work-up.  ESSENTIAL HYPERTENSION, BENIGN -  Blood pressure is controlled.  I am going to make a suggestion to change his medicines as below.   PALPITATIONS -   He is not particularly bothered by these currently.  He is mostly bothered by fatigue.  On the outside chance it is medication related I think he could hold his Cardizem and see if his palpitations are not any worse and his blood pressure still controlled and he is less fatigued he might do okay off of this drug.  We talked about the strategy and he would restart the Cardizem if he has increased blood pressures, increased palpitations.   CARDIOMYOPATHY EF has been mildly reduced.  However, this was really low normal.  On the outside chance that his wheezing is  somehow related to this I will check a BNP level.  However, provided this comes back normal I would not suggest further imaging.   DYSLIPIDEMIA -  His LDL was as above.  He will continue the meds as listed.

## 2018-03-31 NOTE — Telephone Encounter (Signed)
Called and discussed with pt. pulm ref in, CT scan in, see separate documentation.

## 2018-03-31 NOTE — Telephone Encounter (Signed)
Call discussed with patient.  3 days ago after working outside, getting sweaty and slightly short of breath he started coughing up blood, and first large red blood red blood.  It went on for about 30 to 45 minutes coughing up small amounts at a time, sometimes so much that it was coming out of his nose as well.  He thinks total about a palm sized worth of blood/clot. He does not think he had a nosebleed.  He does not think he was throwing up the blood.  Yesterday he had a dark red stool.  No coughing, hemoptysis, dark stools since then.  Over the last 6 months he said 2 other episodes of coughing up bright red blood.  Both times happened when he first woke up when in [place].  He had a normal chest radiograph per care everywhere recently.  Will order CT of chest.  Also will put in referral to see pulmonology for hemoptysis and ongoing inspiratory and expiratory wheezing.  Of note he does think the wheezing is slightly better since all of the coughing up blood.

## 2018-04-02 ENCOUNTER — Ambulatory Visit (INDEPENDENT_AMBULATORY_CARE_PROVIDER_SITE_OTHER): Payer: Medicare Other | Admitting: Cardiology

## 2018-04-02 ENCOUNTER — Other Ambulatory Visit: Payer: Medicare Other

## 2018-04-02 ENCOUNTER — Encounter: Payer: Self-pay | Admitting: Cardiology

## 2018-04-02 VITALS — BP 112/80 | HR 60 | Ht 74.0 in | Wt 241.0 lb

## 2018-04-02 DIAGNOSIS — R002 Palpitations: Secondary | ICD-10-CM | POA: Diagnosis not present

## 2018-04-02 DIAGNOSIS — I10 Essential (primary) hypertension: Secondary | ICD-10-CM

## 2018-04-02 DIAGNOSIS — Z79899 Other long term (current) drug therapy: Secondary | ICD-10-CM

## 2018-04-02 DIAGNOSIS — R062 Wheezing: Secondary | ICD-10-CM

## 2018-04-02 LAB — BASIC METABOLIC PANEL
BUN / CREAT RATIO: 10 (ref 10–24)
BUN: 11 mg/dL (ref 8–27)
CO2: 25 mmol/L (ref 20–29)
Calcium: 9.2 mg/dL (ref 8.6–10.2)
Chloride: 106 mmol/L (ref 96–106)
Creatinine, Ser: 1.08 mg/dL (ref 0.76–1.27)
GFR calc Af Amer: 80 mL/min/{1.73_m2} (ref >59)
GFR calc non Af Amer: 69 mL/min/{1.73_m2} (ref >59)
Glucose: 97 mg/dL (ref 65–99)
Potassium: 5.5 mmol/L — ABNORMAL HIGH (ref 3.5–5.2)
Sodium: 141 mmol/L (ref 134–144)

## 2018-04-02 MED ORDER — CARVEDILOL 12.5 MG PO TABS: ORAL_TABLET | ORAL | 3 refills | Status: DC

## 2018-04-02 MED ORDER — PRAVASTATIN SODIUM 40 MG PO TABS: 40.0000 mg | ORAL_TABLET | Freq: Every day | ORAL | 3 refills | Status: DC

## 2018-04-02 NOTE — Patient Instructions (Addendum)
Medication Instructions:  Please discontinue your Cardizem. Continue all other medications as listed.  Labwork: Please have blood work today (BMP) at [facility].  Follow-Up: Follow up in 1 year with Dr. [name].  You will receive a letter in the mail 2 months before you are due.  Please call us when you receive this letter to schedule your follow up appointment.  If you need a refill on your cardiac medications before your next appointment, please call your pharmacy.  Thank you for choosing [facility]!!

## 2018-04-03 ENCOUNTER — Institutional Professional Consult (permissible substitution): Payer: Medicare Other | Admitting: Internal Medicine

## 2018-04-03 DIAGNOSIS — H04123 Dry eye syndrome of bilateral lacrimal glands: Secondary | ICD-10-CM | POA: Diagnosis not present

## 2018-04-04 ENCOUNTER — Other Ambulatory Visit: Payer: Self-pay | Admitting: *Deleted

## 2018-04-04 DIAGNOSIS — E875 Hyperkalemia: Secondary | ICD-10-CM

## 2018-04-08 ENCOUNTER — Ambulatory Visit (INDEPENDENT_AMBULATORY_CARE_PROVIDER_SITE_OTHER): Payer: Medicare Other

## 2018-04-08 VITALS — BP 124/82 | HR 60 | Temp 97.9°F | Ht 74.0 in | Wt 241.0 lb

## 2018-04-08 DIAGNOSIS — Z0000 Encounter for general adult medical examination without abnormal findings: Secondary | ICD-10-CM

## 2018-04-08 NOTE — Progress Notes (Signed)
Subjective:   Travis Padilla is a 72 y.o. male who presents for Medicare Annual/Subsequent preventive examination.  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  He enjoys bicycling, gardening and tinkering with things at home.  He is married to his wife of 69 years and they have one son.  They live in their own home and have one cat who spends time inside and outdoors.    Review of Systems:  Cardiac Risk Factors include: advanced age (>72men, >75 women);dyslipidemia;hypertension;male gender;smoking/ tobacco exposure     Objective:    Vitals: BP 124/82   Pulse 60   Temp 97.9 F (36.6 C) (Oral)   Ht 6\' 2"  (1.88 m)   Wt 241 lb (109.3 kg)   BMI 30.94 kg/m   Body mass index is 30.94 kg/m.  Advanced Directives 04/08/2018 10/04/2016 05/08/2016 11/03/2014 08/26/2012  Does Patient Have a Medical Advance Directive? Yes Yes Yes Yes Patient does not have advance directive  Type of Advance Directive Living will Living will;Healthcare Power of [place];Living will [place];Living will -  Does patient want to make changes to medical advance directive? No - Patient declined - - - -  Copy of [place] in Chart? - No - copy requested - No - copy requested -  Would patient like information on creating a medical advance directive? - - - No - patient declined information -  Pre-existing out of facility DNR order (yellow form or pink MOST form) - - - - No    Tobacco Social History   Tobacco Use  Smoking Status Former Smoker  . Packs/day: 0.20  . Years: 2.00  . Pack years: 0.40  . Types: Cigarettes  . Start date: 10/01/1968  Smokeless Tobacco Never Used     Patient reports that he smoked for about one year at age 80 - 78, but has not smoked at all since then.  Clinical Intake:  Past Medical History:  Diagnosis Date  . Abnormal nuclear stress test    December, 2013  . Anemia   . CAD (coronary artery  disease)    Mild nonobstructive plaque in cath 2013  . Chest pain    December, 2013  . Dizziness   . Gout   . Hemorrhoid   . Hyperlipidemia   . Hypertension   . Skin cancer   . Spinal stenosis    Past Surgical History:  Procedure Laterality Date  . BELPHAROPTOSIS REPAIR Bilateral   . CATARACT EXTRACTION W/ INTRAOCULAR LENS  IMPLANT, BILATERAL    . COLONOSCOPY N/A 11/03/2014   Procedure: COLONOSCOPY;  Surgeon: Rogene Houston, MD;  Location: [facility];  Service: Endoscopy;  Laterality: N/A;  1225  . KNEE CARTILAGE SURGERY Left    Left knee  . SKIN CANCER EXCISION  12/2014, 04/25/15   Family History  Problem Relation Age of Onset  . Heart attack Father   . Heart failure Mother   . Healthy Sister   . Skin cancer Sister   . Neuropathy Brother   . COPD Brother   . Epilepsy Brother   . Healthy Sister   . Healthy Sister   . Colon cancer Neg Hx    Social History   Socioeconomic History  . Marital status: Married    Spouse name: Not on file  . Number of children: 1  . Years of education: Not on file  . Highest education level: Not on file  Occupational History  . Occupation: Government social research officer  Social Needs  . Financial resource strain: Not on file  . Food insecurity:    Worry: Not on file    Inability: Not on file  . Transportation needs:    Medical: Not on file    Non-medical: Not on file  Tobacco Use  . Smoking status: Former Smoker    Packs/day: 0.20    Years: 2.00    Pack years: 0.40    Types: Cigarettes    Start date: 10/01/1968  . Smokeless tobacco: Never Used  Substance and Sexual Activity  . Alcohol use: No    Comment: hx of   . Drug use: No  . Sexual activity: Not on file  Lifestyle  . Physical activity:    Days per week: Not on file    Minutes per session: Not on file  . Stress: Not on file  Relationships  . Social connections:    Talks on phone: Not on file    Gets together: Not on file    Attends religious service: Not on file    Active  member of club or organization: Not on file    Attends meetings of clubs or organizations: Not on file    Relationship status: Not on file  Other Topics Concern  . Not on file  Social History Narrative  . Not on file    Outpatient Encounter Medications as of 04/08/2018  Medication Sig  . aspirin 81 MG tablet Take 81 mg by mouth daily.  . budesonide-formoterol (SYMBICORT) 160-4.5 MCG/ACT inhaler Inhale 2 puffs into the lungs 2 (two) times daily.   . carvedilol (COREG) 12.5 MG tablet TAKE 1 AND 1/2 TABLETS BY MOUTH TWO TIMES DAILY WITH A MEAL  . IRON, FERROUS GLUCONATE, PO Take by mouth daily.  . pravastatin (PRAVACHOL) 40 MG tablet Take 1 tablet (40 mg total) by mouth daily.   No facility-administered encounter medications on file as of 04/08/2018.     Activities of Daily Living In your present state of health, do you have any difficulty performing the following activities: 04/08/2018  Hearing? Y  Comment chronic infections of left ear, has solution at home that he uses  Vision? N  Difficulty concentrating or making decisions? N  Walking or climbing stairs? N  Dressing or bathing? N  Doing errands, shopping? N  Preparing Food and eating ? N  Using the Toilet? N  In the past six months, have you accidently leaked urine? N  Do you have problems with loss of bowel control? N  Managing your Medications? N  Managing your Finances? N  Housekeeping or managing your Housekeeping? N  Some recent data might be hidden   Patient does report he has noticed some decreased hearing, especially in his left ear.  Patient Care Team: [name], MD as PCP - General (Pediatrics) Gala Romney, Cristopher Estimable, MD as Consulting Physician (Gastroenterology) Griselda Miner, MD as Consulting Physician (Dermatology) Netta Cedars, MD as Consulting Physician (Orthopedic Surgery) Gwendalyn Ege, MD as Referring Physician (Ophthalmology) Rogene Houston, MD as Consulting Physician (Gastroenterology)     Assessment:   This is a routine wellness examination for Amauris.  Exercise Activities and Dietary recommendations Current Exercise Habits: Structured exercise class, Type of exercise: strength training/weights;calisthenics;treadmill, Time (Minutes): > 60, Frequency (Times/Week): 3, Weekly Exercise (Minutes/Week): 0, Intensity: Moderate, Exercise limited by: cardiac condition(s)  Goals    . DIET - EAT MORE FRUITS AND VEGETABLES    . Exercise 3x  per week (30 min per time)     Continue to exercise regularly at least 3 times per week    . Weight (lb) < 230 lb (104.3 kg)       Fall Risk Fall Risk  04/08/2018 03/26/2018 12/04/2017 07/22/2017 05/28/2017  Falls in the past year? Yes No No Yes No  Number falls in past yr: 1 - - 1 -  Injury with Fall? No - - No -   Is the patient's home free of loose throw rugs in walkways, pet beds, electrical cords, etc?   No, has indoor/outdoor cat, but uses caution when she is inside the home.      Grab bars in the bathroom? No      Handrails on the stairs?   Yes      Adequate lighting?   Yes   Depression Screen PHQ 2/9 Scores 04/08/2018 03/26/2018 12/04/2017 07/22/2017  PHQ - 2 Score 0 0 0 0  PHQ- 9 Score - - - -    Cognitive Function MMSE - Mini Mental State Exam 04/08/2018 10/04/2016  Orientation to time 5 5  Orientation to Place 5 5  Registration 3 3  Attention/ Calculation 5 5  Recall 3 2  Language- name 2 objects 2 2  Language- repeat 1 1  Language- follow 3 step command 3 3  Language- read & follow direction 1 1  Write a sentence 1 1  Copy design 1 1  Total score 30 29    Patient performed well on the MMSE, scoring 30 out of 30 available points.    Immunization History  Administered Date(s) Administered  . Influenza Split 05/14/2012  . Influenza, High Dose Seasonal PF 08/03/2014, 05/29/2015, 07/22/2017  . Influenza-Unspecified 06/29/2016  . Pneumococcal Conjugate-13 03/30/2015  . Pneumococcal Polysaccharide-23 07/21/2012  . Td  02/23/2015    Qualifies for Shingles Vaccine? Cost of vaccination is prohibitive at this time.  Screening Tests Health Maintenance  Topic Date Due  . Hepatitis C Screening  07-11-1946  . INFLUENZA VACCINE  04/10/2018  . COLONOSCOPY  11/03/2024  . TETANUS/TDAP  02/22/2025  . PNA vac Low Risk Adult  Completed   Cancer Screenings: Lung: Low Dose CT Chest recommended if Age 21-80 years, 30 pack-year currently smoking OR have quit w/in 15years. Patient does not qualify  Colorectal: Up to date (11/03/2014)     Plan:     Patient to follow up with primary care doctor on 05/15/2018.     I have personally reviewed and noted the following in the patient's chart:   . Medical and social history . Use of alcohol, tobacco or illicit drugs  . Current medications and supplements . Functional ability and status . Nutritional status . Physical activity . Advanced directives . List of other physicians . Hospitalizations, surgeries, and ER visits in previous 12 months . Vitals . Screenings to include cognitive, depression, and falls . Referrals and appointments  In addition, I have reviewed and discussed with patient certain preventive protocols, quality metrics, and best practice recommendations. A written personalized care plan for preventive services as well as general preventive health recommendations were provided to patient.     Burnadette Pop, LPN  11/08/6008  I have reviewed and agree with the above AWV documentation.   Assunta Found, MD [facility] 04/09/2018, 8:10 AM

## 2018-04-08 NOTE — Patient Instructions (Signed)
  Travis Padilla , Thank you for taking time to come for your Medicare Wellness Visit. I appreciate your ongoing commitment to your health goals. Please review the following plan we discussed and let me know if I can assist you in the future.   These are the goals we discussed: Goals    . DIET - EAT MORE FRUITS AND VEGETABLES    . Exercise 3x per week (30 min per time)     Continue to exercise regularly at least 3 times per week    . Weight (lb) < 230 lb (104.3 kg)       This is a list of the screening recommended for you and due dates:  Health Maintenance  Topic Date Due  .  Hepatitis C: One time screening is recommended by Center for Disease Control  (CDC) for  adults born from [birth year] through 1964.   1945-12-22  . Flu Shot  04/10/2018  . Colon Cancer Screening  11/03/2024  . Tetanus Vaccine  02/22/2025  . Pneumonia vaccines  Completed

## 2018-04-09 ENCOUNTER — Other Ambulatory Visit: Payer: Medicare Other

## 2018-04-09 DIAGNOSIS — E875 Hyperkalemia: Secondary | ICD-10-CM | POA: Diagnosis not present

## 2018-04-10 DIAGNOSIS — C3491 Malignant neoplasm of unspecified part of right bronchus or lung: Secondary | ICD-10-CM

## 2018-04-10 HISTORY — DX: Malignant neoplasm of unspecified part of right bronchus or lung: C34.91

## 2018-04-10 LAB — BASIC METABOLIC PANEL
BUN/Creatinine Ratio: 14 (ref 10–24)
BUN: 15 mg/dL (ref 8–27)
CHLORIDE: 107 mmol/L — AB (ref 96–106)
CO2: 23 mmol/L (ref 20–29)
Calcium: 9 mg/dL (ref 8.6–10.2)
Creatinine, Ser: 1.08 mg/dL (ref 0.76–1.27)
GFR calc non Af Amer: 69 mL/min/{1.73_m2} (ref >59)
GFR, EST AFRICAN AMERICAN: 80 mL/min/{1.73_m2} (ref >59)
Glucose: 125 mg/dL — ABNORMAL HIGH (ref 65–99)
POTASSIUM: 4.1 mmol/L (ref 3.5–5.2)
Sodium: 144 mmol/L (ref 134–144)

## 2018-04-16 ENCOUNTER — Ambulatory Visit (HOSPITAL_COMMUNITY)
Admission: RE | Admit: 2018-04-16 | Discharge: 2018-04-16 | Disposition: A | Discharge status: Home / Self Care | Payer: Medicare Other | Source: Ambulatory Visit | Attending: Pediatrics | Admitting: Pediatrics

## 2018-04-16 DIAGNOSIS — R918 Other nonspecific abnormal finding of lung field: Secondary | ICD-10-CM | POA: Insufficient documentation

## 2018-04-16 DIAGNOSIS — R0602 Shortness of breath: Secondary | ICD-10-CM | POA: Diagnosis not present

## 2018-04-16 DIAGNOSIS — I2510 Atherosclerotic heart disease of native coronary artery without angina pectoris: Secondary | ICD-10-CM | POA: Diagnosis not present

## 2018-04-16 DIAGNOSIS — M799 Soft tissue disorder, unspecified: Secondary | ICD-10-CM | POA: Insufficient documentation

## 2018-04-16 DIAGNOSIS — N200 Calculus of kidney: Secondary | ICD-10-CM | POA: Diagnosis not present

## 2018-04-16 DIAGNOSIS — R042 Hemoptysis: Secondary | ICD-10-CM

## 2018-04-16 DIAGNOSIS — I700 Atherosclerosis of aorta: Secondary | ICD-10-CM | POA: Insufficient documentation

## 2018-04-17 ENCOUNTER — Encounter: Payer: Self-pay | Admitting: *Deleted

## 2018-04-17 ENCOUNTER — Ambulatory Visit (INDEPENDENT_AMBULATORY_CARE_PROVIDER_SITE_OTHER): Payer: Medicare Other | Admitting: Pediatrics

## 2018-04-17 ENCOUNTER — Encounter: Payer: Self-pay | Admitting: Pediatrics

## 2018-04-17 DIAGNOSIS — R918 Other nonspecific abnormal finding of lung field: Secondary | ICD-10-CM | POA: Diagnosis not present

## 2018-04-17 DIAGNOSIS — F430 Acute stress reaction: Secondary | ICD-10-CM | POA: Diagnosis not present

## 2018-04-17 DIAGNOSIS — R2232 Localized swelling, mass and lump, left upper limb: Secondary | ICD-10-CM

## 2018-04-17 MED ORDER — CARVEDILOL 12.5 MG PO TABS: ORAL_TABLET | ORAL | 3 refills | Status: DC

## 2018-04-17 MED ORDER — CLONAZEPAM 0.5 MG PO TABS: 0.5000 mg | ORAL_TABLET | Freq: Two times a day (BID) | ORAL | 0 refills | Status: DC | PRN

## 2018-04-17 NOTE — Progress Notes (Addendum)
  Subjective:   Patient ID: Travis Padilla, male    DOB: [birth year], 72 y.o.   MRN: [id number] CC: Review CT scan HPI: Travis Padilla is a 72 y.o. male   Episode of hemoptysis couple weeks ago.  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  He does have a history of left upper chest superficial skin cancer that was removed about 3 years ago by dermatology.  He is felt like he is wheezing for the last 5 months.  He was seen by pulmonologist in [place], had normal spirometry done at that time, chest x-ray with  lower lobe predominant interstitial thickening, no focal consolidations or substantial effusions.   He has been feeling well overall, exercise tolerance slightly down the last couple weeks.  Regularly participating in exercise classes, has been very active.  Relevant past medical, surgical, family and social history reviewed. Allergies and medications reviewed and updated.  ROS: Per HPI   Objective:    There were no vitals taken for this visit.  Wt Readings from Last 3 Encounters:  04/08/18 241 lb (109.3 kg)  04/02/18 241 lb (109.3 kg)  03/26/18 229 lb 6.4 oz (104.1 kg)    Gen: NAD, alert, cooperative with exam, NCAT EYES: EOMI, no conjunctival injection, or no icterus CV: NRRR, normal S1/S2, no murmur, distal pulses 2+ b/l Resp: Inspiratory and expiratory wheezing present, normal WOB Abd: +BS, soft, NTND. no guarding or organomegaly Ext: No edema, warm Neuro: Alert and oriented, strength equal b/l UE and LE, coordination grossly normal MSK: normal muscle bulk Skin: Hypopigmented well-demarcated plaque approximately 1 cm left posterior upper arm.  Left upper chest with hypertrophic scar.  Assessment & Plan:  Travis Padilla was seen today for discuss ct scan.  Diagnoses and all orders for this visit:  Lung  mass Axillary mass Reviewed CT scan images with patient.  Discussed next steps, getting him into see a specialist, will need additional imaging, tissue biopsies.  Questions answered. Pt asks for something to calm his thoughts tonight, #10 tabs clonazepam sent in. -     Ambulatory referral to Oncology  Total time spent with the patient was 15 minutes with greater than 50% of the time spent in face-to-face consultation discussing above.    Follow up plan: Return in about 4 weeks (around 05/15/2018). Assunta Found, MD [place]

## 2018-04-18 ENCOUNTER — Encounter: Payer: Self-pay | Admitting: *Deleted

## 2018-04-18 DIAGNOSIS — R918 Other nonspecific abnormal finding of lung field: Secondary | ICD-10-CM

## 2018-04-18 NOTE — Progress Notes (Signed)
Oncology Nurse Navigator Documentation  Oncology Nurse Navigator Flowsheets 04/18/2018  Navigator Location [facility]  Referral date to RadOnc/MedOnc 04/17/2018  Navigator Encounter Type Telephone/I received referral on Mr. Thong yesterday. I updated Dr. Julien Nordmann. I called Mr. Peatross and scheduled him to be seen on 04/22/18.  He verbalized understanding ofappt  Telephone Outgoing Call  Treatment Phase Abnormal Scans  Barriers/Navigation Needs Education;Coordination of Care  Education Other  Interventions Coordination of Care;Education  Coordination of Care Appts  Education Method Verbal  Acuity Level 2  Time Spent with Patient 30

## 2018-04-22 ENCOUNTER — Encounter: Payer: Self-pay | Admitting: Internal Medicine

## 2018-04-22 ENCOUNTER — Telehealth: Payer: Self-pay

## 2018-04-22 ENCOUNTER — Inpatient Hospital Stay: Payer: Medicare Other | Attending: Internal Medicine | Admitting: Internal Medicine

## 2018-04-22 ENCOUNTER — Telehealth: Payer: Self-pay | Admitting: Internal Medicine

## 2018-04-22 ENCOUNTER — Encounter: Payer: Self-pay | Admitting: *Deleted

## 2018-04-22 ENCOUNTER — Inpatient Hospital Stay: Payer: Medicare Other

## 2018-04-22 VITALS — BP 133/77 | HR 94 | Temp 98.7°F | Resp 18 | Ht 74.0 in | Wt 239.5 lb

## 2018-04-22 DIAGNOSIS — E785 Hyperlipidemia, unspecified: Secondary | ICD-10-CM

## 2018-04-22 DIAGNOSIS — R51 Headache: Secondary | ICD-10-CM | POA: Insufficient documentation

## 2018-04-22 DIAGNOSIS — Z7982 Long term (current) use of aspirin: Secondary | ICD-10-CM | POA: Diagnosis not present

## 2018-04-22 DIAGNOSIS — Z79899 Other long term (current) drug therapy: Secondary | ICD-10-CM | POA: Insufficient documentation

## 2018-04-22 DIAGNOSIS — C773 Secondary and unspecified malignant neoplasm of axilla and upper limb lymph nodes: Secondary | ICD-10-CM | POA: Insufficient documentation

## 2018-04-22 DIAGNOSIS — R042 Hemoptysis: Secondary | ICD-10-CM | POA: Insufficient documentation

## 2018-04-22 DIAGNOSIS — Z85828 Personal history of other malignant neoplasm of skin: Secondary | ICD-10-CM | POA: Diagnosis not present

## 2018-04-22 DIAGNOSIS — C3491 Malignant neoplasm of unspecified part of right bronchus or lung: Secondary | ICD-10-CM | POA: Diagnosis not present

## 2018-04-22 DIAGNOSIS — J45909 Unspecified asthma, uncomplicated: Secondary | ICD-10-CM | POA: Insufficient documentation

## 2018-04-22 DIAGNOSIS — R222 Localized swelling, mass and lump, trunk: Secondary | ICD-10-CM

## 2018-04-22 DIAGNOSIS — C3490 Malignant neoplasm of unspecified part of unspecified bronchus or lung: Secondary | ICD-10-CM

## 2018-04-22 DIAGNOSIS — D649 Anemia, unspecified: Secondary | ICD-10-CM | POA: Diagnosis not present

## 2018-04-22 DIAGNOSIS — I10 Essential (primary) hypertension: Secondary | ICD-10-CM | POA: Diagnosis not present

## 2018-04-22 DIAGNOSIS — Z87891 Personal history of nicotine dependence: Secondary | ICD-10-CM

## 2018-04-22 DIAGNOSIS — I700 Atherosclerosis of aorta: Secondary | ICD-10-CM | POA: Diagnosis not present

## 2018-04-22 DIAGNOSIS — R918 Other nonspecific abnormal finding of lung field: Secondary | ICD-10-CM

## 2018-04-22 DIAGNOSIS — F419 Anxiety disorder, unspecified: Secondary | ICD-10-CM | POA: Insufficient documentation

## 2018-04-22 DIAGNOSIS — R5383 Other fatigue: Secondary | ICD-10-CM | POA: Insufficient documentation

## 2018-04-22 DIAGNOSIS — R0789 Other chest pain: Secondary | ICD-10-CM | POA: Insufficient documentation

## 2018-04-22 DIAGNOSIS — I2510 Atherosclerotic heart disease of native coronary artery without angina pectoris: Secondary | ICD-10-CM | POA: Insufficient documentation

## 2018-04-22 DIAGNOSIS — M109 Gout, unspecified: Secondary | ICD-10-CM | POA: Insufficient documentation

## 2018-04-22 HISTORY — DX: Other nonspecific abnormal finding of lung field: R91.8

## 2018-04-22 HISTORY — DX: Hemoptysis: R04.2

## 2018-04-22 LAB — CBC WITH DIFFERENTIAL (CANCER CENTER ONLY)
Basophils Absolute: 0 10*3/uL (ref 0.0–0.1)
Basophils Relative: 0 %
Eosinophils Absolute: 0.2 10*3/uL (ref 0.0–0.5)
Eosinophils Relative: 3 %
HCT: 37.2 % — ABNORMAL LOW (ref 38.4–49.9)
Hemoglobin: 12.2 g/dL — ABNORMAL LOW (ref 13.0–17.1)
LYMPHS ABS: 2 10*3/uL (ref 0.9–3.3)
LYMPHS PCT: 26 %
MCH: 29.6 pg (ref 27.2–33.4)
MCHC: 32.8 g/dL (ref 32.0–36.0)
MCV: 90.3 fL (ref 79.3–98.0)
Monocytes Absolute: 0.9 10*3/uL (ref 0.1–0.9)
Monocytes Relative: 12 %
NEUTROS ABS: 4.4 10*3/uL (ref 1.5–6.5)
NEUTROS PCT: 59 %
Platelet Count: 195 10*3/uL (ref 140–400)
RBC: 4.12 MIL/uL — AB (ref 4.20–5.82)
RDW: 12.9 % (ref 11.0–14.6)
WBC: 7.5 10*3/uL (ref 4.0–10.3)

## 2018-04-22 LAB — CMP (CANCER CENTER ONLY)
ALT: 9 U/L (ref 0–44)
ANION GAP: 9 (ref 5–15)
AST: 13 U/L — AB (ref 15–41)
Albumin: 3.8 g/dL (ref 3.5–5.0)
Alkaline Phosphatase: 66 U/L (ref 38–126)
BUN: 17 mg/dL (ref 8–23)
CHLORIDE: 108 mmol/L (ref 98–111)
CO2: 24 mmol/L (ref 22–32)
Calcium: 9.2 mg/dL (ref 8.9–10.3)
Creatinine: 1.16 mg/dL (ref 0.61–1.24)
Glucose, Bld: 105 mg/dL — ABNORMAL HIGH (ref 70–99)
POTASSIUM: 4.6 mmol/L (ref 3.5–5.1)
Sodium: 141 mmol/L (ref 135–145)
Total Bilirubin: 0.5 mg/dL (ref 0.3–1.2)
Total Protein: 7.7 g/dL (ref 6.5–8.1)

## 2018-04-22 NOTE — Progress Notes (Signed)
[place] Telephone:([phone]) [phone]   [phone]) [phone]  CONSULT NOTE  REFERRING PHYSICIAN: Dr. Assunta Found  REASON FOR CONSULTATION:  72 years old white male with questionable metastatic neoplasm  HPI Travis Padilla is a 72 y.o. male with past medical history significant for hypertension, coronary artery disease, dyslipidemia, spinal stenosis, gout, anemia as well as dizzy spells.  The patient also has remote history of his smoking for around 3 years but quit 50 years ago.  He mentioned that he has wheezes and shortness of breath for around 4 months.  He was seen by his primary care physician and treated for questionable allergy.  He traveled to [place] for some business and stayed there for 3 months.  During that time he has evaluation at [facility] clinic and treated for asthma and allergy.  Chest x-ray performed at that time was unremarkable.  The patient returned back home and he continues to have the same complaints in addition to few episodes of hemoptysis.  He was seen again by his primary care physician Dr. Evette Doffing and CT of the chest was performed on 04/16/2018 and that showed in the central aspect of the superior segment of the right lower lobe there is a large mass measured 3.7 x 7.1 x 3.6 cm in direct contact with the bronchus intermedius and the right lower lobe bronchi where some of the soft tissue from the lesion appeared to slightly protrude into and narrows the lumen.  There was adjacent pleural thickening posteriorly to the right lower lobe mass with postobstructive inflammatory changes.  There was also prominent soft tissue in the right hilar region contagious with the right lower lobe mass suspicious for right hilar lymphadenopathy.  The scan also showed large pulmonary nodule measured 2.7 x 2.2 x 1.9 cm demonstrates macro lobulated and spiculated margins with retraction of the overlying pleura.  The scan also showed an irregular shaped soft tissue mass with  microlobulated and spiculated margins measuring 3.5 x 3.3 x 3.3 cm. The patient was referred to me today for evaluation and recommendation regarding his condition. When seen today he continues to complain of tightness in his the chest as well as dry cough.  He had few episodes of hemoptysis the last one was 2 days ago.  He also has lack of energy.  Has no significant shortness of breath.  He denied having any significant weight loss or night sweats.  He has intermittent headache but no visual changes.  He has no nausea, vomiting, diarrhea or constipation. Family history significant for mother and father with heart disease and sister with skin cancer. The patient is married and has 1 son.  He is a retired Chief Financial Officer.  He has a history for smoking for only 3 years and quit 50 years ago.  He used to drink alcohol at regular basis but quit 5 years ago.  He has no history of drug abuse.  HPI  Past Medical History:  Diagnosis Date  . Abnormal nuclear stress test    December, 2013  . Anemia   . CAD (coronary artery disease)    Mild nonobstructive plaque in cath 2013  . Chest pain    December, 2013  . Dizziness   . Gout   . Hemorrhoid   . Hyperlipidemia   . Hypertension   . Skin cancer   . Spinal stenosis     Past Surgical History:  Procedure Laterality Date  . BELPHAROPTOSIS REPAIR Bilateral   . CATARACT EXTRACTION W/  INTRAOCULAR LENS  IMPLANT, BILATERAL    . COLONOSCOPY N/A 11/03/2014   Procedure: COLONOSCOPY;  Surgeon: Rogene Houston, MD;  Location: [facility];  Service: Endoscopy;  Laterality: N/A;  1225  . KNEE CARTILAGE SURGERY Left    Left knee  . SKIN CANCER EXCISION  12/2014, 04/25/15    Family History  Problem Relation Age of Onset  . Heart attack Father   . Heart failure Mother   . Healthy Sister   . Skin cancer Sister   . Neuropathy Brother   . COPD Brother   . Epilepsy Brother   . Healthy Sister   . Healthy Sister   . Colon cancer Neg Hx     Social  History Social History   Tobacco Use  . Smoking status: Former Smoker    Packs/day: 0.20    Years: 2.00    Pack years: 0.40    Types: Cigarettes    Start date: 10/01/1968  . Smokeless tobacco: Never Used  Substance Use Topics  . Alcohol use: No    Comment: hx of   . Drug use: No    Allergies  Allergen Reactions  . Trazodone And Nefazodone Palpitations    Current Outpatient Medications  Medication Sig Dispense Refill  . aspirin 81 MG tablet Take 81 mg by mouth daily.    . budesonide-formoterol (SYMBICORT) 160-4.5 MCG/ACT inhaler Inhale 2 puffs into the lungs 2 (two) times daily.     . carvedilol (COREG) 12.5 MG tablet TAKE 1 AND 1/2 TABLETS BY MOUTH TWO TIMES DAILY WITH A MEAL 270 tablet 3  . clonazePAM (KLONOPIN) 0.5 MG tablet Take 1 tablet (0.5 mg total) by mouth 2 (two) times daily as needed for anxiety. 10 tablet 0  . IRON, FERROUS GLUCONATE, PO Take by mouth daily.    . pravastatin (PRAVACHOL) 40 MG tablet Take 1 tablet (40 mg total) by mouth daily. 90 tablet 3   No current facility-administered medications for this visit.     Review of Systems  Constitutional: positive for fatigue Eyes: negative Ears, nose, mouth, throat, and face: negative Respiratory: positive for cough, hemoptysis and wheezing Cardiovascular: negative Gastrointestinal: negative Genitourinary:negative Integument/breast: negative Hematologic/lymphatic: negative Musculoskeletal:negative Neurological: positive for headaches Behavioral/Psych: negative Endocrine: negative Allergic/Immunologic: negative  Physical Exam  YKD:XIPJA, healthy, no distress, well nourished, well developed and anxious SKIN: skin color, texture, turgor are normal, no rashes or significant lesions HEAD: Normocephalic, No masses, lesions, tenderness or abnormalities EYES: normal, PERRLA, Conjunctiva are pink and non-injected EARS: External ears normal, Canals clear OROPHARYNX:no exudate, no erythema and lips, buccal  mucosa, and tongue normal  NECK: supple, no adenopathy, no JVD LYMPH:  no palpable lymphadenopathy, no hepatosplenomegaly LUNGS: expiratory wheezes bilaterally, scattered rhonchi bilaterally HEART: regular rate & rhythm, no murmurs and no gallops ABDOMEN:abdomen soft, non-tender, normal bowel sounds and no masses or organomegaly BACK: No CVA tenderness, Range of motion is normal EXTREMITIES:no joint deformities, effusion, or inflammation, no edema  NEURO: alert & oriented x 3 with fluent speech, no focal motor/sensory deficits  PERFORMANCE STATUS: ECOG 1  LABORATORY DATA: Lab Results  Component Value Date   WBC 7.5 04/22/2018   HGB 12.2 (L) 04/22/2018   HCT 37.2 (L) 04/22/2018   MCV 90.3 04/22/2018   PLT 195 04/22/2018      Chemistry      Component Value Date/Time   NA 141 04/22/2018 1341   NA 144 04/09/2018 1507   K 4.6 04/22/2018 1341   CL 108 04/22/2018 1341  CO2 24 04/22/2018 1341   BUN 17 04/22/2018 1341   BUN 15 04/09/2018 1507   CREATININE 1.16 04/22/2018 1341      Component Value Date/Time   CALCIUM 9.2 04/22/2018 1341   ALKPHOS 66 04/22/2018 1341   AST 13 (L) 04/22/2018 1341   ALT 9 04/22/2018 1341   BILITOT 0.5 04/22/2018 1341       RADIOGRAPHIC STUDIES: Dg Ankle Complete Left  Result Date: 03/26/2018 CLINICAL DATA:  Left ankle pain and swelling for several weeks. No reported injury. EXAM: LEFT ANKLE COMPLETE - 3+ VIEW COMPARISON:  None. FINDINGS: Mild lateral left ankle soft tissue swelling. No fracture or subluxation. No suspicious focal osseous lesions. Tiny marginal osteophytes in the left ankle joint. Small Achilles left calcaneal spur. No radiopaque foreign body. IMPRESSION: Mild lateral left ankle soft tissue swelling, with no fracture or subluxation. 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 Small Achilles left calcaneal spur. Electronically Signed   By: Ilona Sorrel M.D.   On: 03/26/2018 14:06   Ct Chest High Resolution  Result Date:  04/16/2018 CLINICAL DATA:  72 year old male with history of wheezing for the past 4 months with shortness of breath. Three episodes of hemoptysis. EXAM: CT CHEST WITHOUT CONTRAST TECHNIQUE: Multidetector CT imaging of the chest was performed following the standard protocol without intravenous contrast. High resolution imaging of the lungs, as well as inspiratory and expiratory imaging, was performed. COMPARISON:  Chest CT 08/20/2007. FINDINGS: Cardiovascular: Heart size is normal. There is no significant pericardial fluid, thickening or pericardial calcification. There is aortic atherosclerosis, as well as atherosclerosis of the great vessels of the mediastinum and the coronary arteries, including calcified atherosclerotic plaque in the left main, left anterior descending, left circumflex and right coronary arteries. Mediastinum/Nodes: Prominent soft tissue in the right hilar region contiguous with the right lower lobe mass (discussed below), at least part of which is presumably right hilar lymphadenopathy. No mediastinal or definite left hilar lymphadenopathy. Esophagus is unremarkable in appearance. In the left axillary region there is an irregular-shaped soft tissue mass with macrolobulated and spiculated margins measuring 3.5 x 3.3 x 3.3 cm (axial image 16 of series 3 and coronal image 36 of series 9). Lungs/Pleura: In the central aspect of the superior segment of the right lower lobe there is a large mass which is estimated to measure 3.7 x 7.1 x 3.6 cm (axial image 81 of series 7 and coronal image 62 of series 9). This mass comes in direct contact with the bronchus intermedius and right lower lobe bronchi, where some of the soft tissue from the lesion appears to slightly protrude into and narrows the lumen (best appreciated on axial image 79 of series 7). Adjacent pleural thickening posteriorly to the right lower lobe mass, and some surrounding postobstructive inflammatory changes in the basal segments of the  right lower lobe. In the anterior aspect of the left upper lobe there is an additional large pulmonary nodule (axial image 78 of series 7 and coronal image 27 of series 9) which measures 2.7 x 2.2 x 1.9 cm and also demonstrates macrolobulated and spiculated margins with retraction of the overlying pleura. No acute consolidative airspace disease. No left pleural effusion. High-resolution images demonstrate no significant regions of ground-glass attenuation, subpleural reticulation, parenchymal banding, traction bronchiectasis or frank honeycombing. Inspiratory and expiratory imaging demonstrates moderate air trapping predominantly in the right lower lobe, presumably secondary to the centrally obstructing mass. Upper Abdomen: Small nonobstructive calculi measuring 2-4 mm are noted in both renal collecting  systems. Aortic atherosclerosis. Musculoskeletal: There are no aggressive appearing lytic or blastic lesions noted in the visualized portions of the skeleton. IMPRESSION: 1. No evidence of interstitial lung disease. 2. 3.7 x 7.1 x 3.6 cm mass centered in the superior segment of the right lower lobe with probable right hilar lymphadenopathy and bronchial invasion, as discussed above. Findings are highly concerning for primary bronchogenic neoplasm. 3. In addition, there is a 2.7 x 2.2 x 1.9 cm suspicious appearing nodule in the left upper lobe which likely represents a synchronous primary bronchogenic neoplasm. 4. In addition, there is a malignant appearing soft tissue mass in the left axilla measuring 3.5 x 3.3 x 3.3 cm. This could represent a nodal metastasis, but this would be highly unusual from a primary bronchogenic neoplasm. The possibility of a nodal metastasis from a lesion on the left upper extremity should be considered, and correlation with cutaneous physical examination is recommended. 5. For further evaluation of findings 2-4 above further evaluation with PET-CT is recommended in the near future. 6.  Aortic atherosclerosis, in addition to left main and 3 vessel coronary artery disease. Assessment for potential risk factor modification, dietary therapy or pharmacologic therapy may be warranted, if clinically indicated. 7. Nonobstructive calculi measuring 2-4 mm in the collecting systems of both kidneys. Aortic Atherosclerosis (ICD10-I70.0). Electronically Signed   By: Vinnie Langton M.D.   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  He presented with right lower lobe lung mass in addition to right hilar lymphadenopathy, left upper lobe lung nodule as well as left axillary mass.   PLAN: I had a lengthy discussion with the patient today about his current disease status and further investigation to confirm his diagnosis.  I personally and independently reviewed the scan images and discussed the result and showed the images to the patient today. I recommended for the patient to complete the staging work-up by ordering a PET scan as well as MRI of the brain to rule out any other metastatic disease. I also referred the patient to interventional radiology for consideration of ultrasound-guided core biopsy of the left axillary mass for tissue diagnosis. For the hemoptysis I will refer the patient to radiation oncology for evaluation and consideration of palliative treatment of the invasive right lower lobe lung mass. I will arrange for the patient to come back for follow-up visit in around 2 weeks for reevaluation and discussion of his treatment options after the staging work-up and biopsy results. He was advised to call immediately if he has any concerning symptoms in the interval or go immediately to the emergency department if he has any hemoptysis.  The patient voices understanding of current disease status and treatment  options and is in agreement with the current care plan.  All questions were answered. The patient knows to call the clinic with any problems, questions or concerns. We can certainly see the patient much sooner if necessary.  Thank you so much for allowing me to participate in the care of Travis Padilla. I will continue to follow up the patient with you and assist in his care.  I spent 40 minutes counseling the patient face to face. The total time spent in the appointment was 60 minutes.  Disclaimer: This note was dictated with voice recognition software. Similar sounding words can inadvertently be transcribed and may  not be corrected upon review.   Eilleen Kempf April 22, 2018, 2:44 PM

## 2018-04-22 NOTE — Telephone Encounter (Signed)
I received sch msg from Seth Bake regarding Nut Appt [facility] Patient)-??/ I called Seth Bake and she stated that this was the incorrect patient and to disregard appt for nutrition/ per 8/9 sch msg

## 2018-04-22 NOTE — Telephone Encounter (Signed)
Printed avs and no calender. No upcoming appointment with [facility] it will be based on the Scan results. Per 8/13 los

## 2018-04-22 NOTE — Progress Notes (Signed)
Oncology Nurse Navigator Documentation  Oncology Nurse Navigator Flowsheets 04/22/2018  Navigator Location [facility]  Navigator Encounter Type Clinic/MDC/I spoke with patient today at clinic.  He has abnormal lung masses and under his left arm.  Patient need further work up of scans and biopsy.  I help to educate on next steps including pre-procedure for PET scan.    Abnormal Finding Date 04/16/2018  Patient Visit Type MedOnc  Treatment Phase Abnormal Scans  Barriers/Navigation Needs Education  Education Other  Interventions Education  Education Method Verbal  Acuity Level 2  Acuity Level 2 Educational needs  Time Spent with Patient 30

## 2018-04-23 ENCOUNTER — Telehealth: Payer: Self-pay | Admitting: *Deleted

## 2018-04-23 NOTE — Progress Notes (Signed)
Thoracic Location of Tumor / Histology: Right Lung: right lower lobe lung mass in addition to right hilar lymphadenopathy, left upper lobe lung nodule as well as left axillary mass.  Patient mentioned that he had wheezes and shortness of breath for around 4 months.  He was seen by his primary care physician and treated for questionable allergy.  He traveled to [place] for some business and stayed there for 3 months.  During that time he has evaluation at [facility] clinic and treated for asthma and allergy.   After returning home the patient continued to have shortness of breath, wheezing, as well as hemoptysis.  Tightness in the chest, dry cough, hemoptysis, and lack of energy.  Chest xray in [place]: unremarkable  CT chest 04/16/2018: Showed in the central aspect of the superior segment of the right lower lobe there is a large mass measured 3.7 x 7.1 x 3.6 cm in direct contact with the bronchus intermedius and the right lower lobe bronchi where some of the soft tissue from the lesion appeared to slightly protrude into and narrows the lumen.  There was adjacent pleural thickening posteriorly to the right lower lobe mass with postobstructive inflammatory changes.  There was also prominent soft tissue in the right hilar region contagious with the right lower lobe mass suspicious for right hilar lymphadenopathy.  The scan also showed large pulmonary nodule measured 2.7 x 2.2 x 1.9 cm demonstrates macro lobulated and spiculated margins with retraction of the overlying pleura.  The scan also showed an irregular shaped soft tissue mass with microlobulated and spiculated margins measuring 3.5 x 3.3 x 3.3 cm.  Biopsies of   Tobacco/Marijuana/Snuff/ETOH use: Former smoker.  Past/Anticipated interventions by cardiothoracic surgery, if any:   Past/Anticipated interventions by medical oncology, if any:  Dr. Julien Nordmann 04/22/2018 -I recommended for the patient to complete the staging work-up by ordering a PET scan as  well as MRI of the brain to rule out any other metastatic disease. -I also referred the patient to interventional radiology for consideration of ultrasound-guided core biopsy of the left axillary mass for tissue diagnosis. -For the hemoptysis I will refer the patient to radiation oncology for evaluation and consideration of palliative treatment of the invasive right lower lobe lung mass. -I will arrange for the patient to come back for follow-up visit in around 2 weeks for reevaluation and discussion of his treatment options after the staging work-up and biopsy results.   Signs/Symptoms   Weight changes, if any: No  Respiratory complaints, if any: Wheezing.  Hemoptysis, if any: last hemoptysis was a week ago, small amount, dark blood.  Pain issues, if any:  Left arm goes numb when he walks around the block. Chest tightness on occasion.   SAFETY ISSUES:  Prior radiation? No  Pacemaker/ICD? No  Possible current pregnancy? No  Is the patient on methotrexate? No  Current Complaints / other details:   Left Axillary node biopsy scheduled 8/20 1pm PET scheduled 8/26 3pm MRI Brain 8/26 5pm Appointment scheduled with Dr. Lake Bells 05/07/2018 1:30pm

## 2018-04-23 NOTE — Telephone Encounter (Signed)
Pt called regarding f/u MD appt.Reviewed with MD. Returned call to pt lmovm MD f/u will be scheduled after all results ;MRI/PET/BX have come back. Pt will receive a call with additional f/u information.

## 2018-04-24 ENCOUNTER — Ambulatory Visit
Admission: RE | Admit: 2018-04-24 | Discharge: 2018-04-24 | Disposition: A | Discharge status: Home / Self Care | Payer: Medicare Other | Source: Ambulatory Visit | Attending: Radiation Oncology | Admitting: Radiation Oncology

## 2018-04-24 ENCOUNTER — Encounter: Payer: Self-pay | Admitting: Radiation Oncology

## 2018-04-24 ENCOUNTER — Telehealth: Payer: Self-pay | Admitting: Medical Oncology

## 2018-04-24 ENCOUNTER — Other Ambulatory Visit: Payer: Self-pay

## 2018-04-24 VITALS — BP 134/74 | HR 65 | Temp 98.4°F | Resp 18 | Ht 74.0 in | Wt 237.6 lb

## 2018-04-24 DIAGNOSIS — R918 Other nonspecific abnormal finding of lung field: Secondary | ICD-10-CM | POA: Diagnosis not present

## 2018-04-24 DIAGNOSIS — I10 Essential (primary) hypertension: Secondary | ICD-10-CM | POA: Insufficient documentation

## 2018-04-24 DIAGNOSIS — F17211 Nicotine dependence, cigarettes, in remission: Secondary | ICD-10-CM | POA: Diagnosis not present

## 2018-04-24 DIAGNOSIS — Z510 Encounter for antineoplastic radiation therapy: Secondary | ICD-10-CM | POA: Diagnosis not present

## 2018-04-24 DIAGNOSIS — I2510 Atherosclerotic heart disease of native coronary artery without angina pectoris: Secondary | ICD-10-CM | POA: Diagnosis not present

## 2018-04-24 DIAGNOSIS — Z85828 Personal history of other malignant neoplasm of skin: Secondary | ICD-10-CM | POA: Diagnosis not present

## 2018-04-24 DIAGNOSIS — C3431 Malignant neoplasm of lower lobe, right bronchus or lung: Secondary | ICD-10-CM | POA: Diagnosis not present

## 2018-04-24 DIAGNOSIS — L821 Other seborrheic keratosis: Secondary | ICD-10-CM | POA: Diagnosis not present

## 2018-04-24 DIAGNOSIS — C7801 Secondary malignant neoplasm of right lung: Secondary | ICD-10-CM | POA: Diagnosis not present

## 2018-04-24 DIAGNOSIS — C7802 Secondary malignant neoplasm of left lung: Secondary | ICD-10-CM | POA: Insufficient documentation

## 2018-04-24 DIAGNOSIS — Z7982 Long term (current) use of aspirin: Secondary | ICD-10-CM | POA: Diagnosis not present

## 2018-04-24 DIAGNOSIS — C3490 Malignant neoplasm of unspecified part of unspecified bronchus or lung: Secondary | ICD-10-CM | POA: Insufficient documentation

## 2018-04-24 DIAGNOSIS — D225 Melanocytic nevi of trunk: Secondary | ICD-10-CM | POA: Diagnosis not present

## 2018-04-24 DIAGNOSIS — C773 Secondary and unspecified malignant neoplasm of axilla and upper limb lymph nodes: Secondary | ICD-10-CM | POA: Insufficient documentation

## 2018-04-24 DIAGNOSIS — Z87891 Personal history of nicotine dependence: Secondary | ICD-10-CM | POA: Insufficient documentation

## 2018-04-24 DIAGNOSIS — R590 Localized enlarged lymph nodes: Secondary | ICD-10-CM | POA: Diagnosis not present

## 2018-04-24 DIAGNOSIS — Z79899 Other long term (current) drug therapy: Secondary | ICD-10-CM | POA: Diagnosis not present

## 2018-04-24 DIAGNOSIS — D224 Melanocytic nevi of scalp and neck: Secondary | ICD-10-CM | POA: Diagnosis not present

## 2018-04-24 DIAGNOSIS — D485 Neoplasm of uncertain behavior of skin: Secondary | ICD-10-CM | POA: Diagnosis not present

## 2018-04-24 DIAGNOSIS — D2262 Melanocytic nevi of left upper limb, including shoulder: Secondary | ICD-10-CM | POA: Diagnosis not present

## 2018-04-24 DIAGNOSIS — L570 Actinic keratosis: Secondary | ICD-10-CM | POA: Diagnosis not present

## 2018-04-24 DIAGNOSIS — D2261 Melanocytic nevi of right upper limb, including shoulder: Secondary | ICD-10-CM | POA: Diagnosis not present

## 2018-04-24 DIAGNOSIS — D1801 Hemangioma of skin and subcutaneous tissue: Secondary | ICD-10-CM | POA: Diagnosis not present

## 2018-04-24 HISTORY — DX: Localized enlarged lymph nodes: R59.0

## 2018-04-24 NOTE — Progress Notes (Signed)
Radiation Oncology         ([phone]) [phone] ________________________________  Name: Travis Padilla        MRN: [id number]  Date of Service: 04/24/2018 DOB: 12/16/1945  VQ:[id number], [place] Hun, MD  Curt Bears, MD     REFERRING PHYSICIAN: Curt Bears, MD   DIAGNOSIS: The primary encounter diagnosis was Right lower lobe lung mass. A diagnosis of Axillary adenopathy was also pertinent to this visit.   HISTORY OF PRESENT ILLNESS: Travis Padilla is a 72 y.o. male seen at the request of Dr. Julien Nordmann for a new diagnosis of lung cancer. The patient has a short term, remote history of smoking, and about 4 months ago developed wheezing and a cough. He was treated for an allergic condition. 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 He returned home and was still having symptoms and was seen on 04/16/18 for a CT of the chest which revealed a large 3.7 x 7.1 x  3.6 cm mass in the RLL in direct contact with the bronchus intermedius and the right lower lobe bronchi where some of the soft tissue from the lesion appeared to slightly protrude into and narrows the lumen.  There was adjacent pleural thickening posteriorly to the right lower lobe mass with postobstructive inflammatory changes.  There was also prominent soft tissue in the right hilar region contagious with the right lower lobe mass suspicious for right hilar lymphadenopathy.  The scan also showed a left upper lobe pulmonary nodule measured 2.7 x 2.2 x 1.9 cm demonstrates macro lobulated and spiculated margins with retraction of the overlying pleura.  The scan also showed an irregular shaped soft tissue mass with microlobulated and spiculated margins measuring 3.5 x 3.3 x 3.3 cm in the left axilla. He has been seen by Dr. Julien Nordmann. He is getting set up for an ultrasound biopsy of the left axillary mass on 04/29/18, and for PET and brain  MRI on 05/05/18. He comes today to discuss options of treatment for his presumed malignancy.  PREVIOUS RADIATION THERAPY: No   PAST MEDICAL HISTORY:  Past Medical History:  Diagnosis Date  . Abnormal nuclear stress test    December, 2013  . Anemia   . CAD (coronary artery disease)    Mild nonobstructive plaque in cath 2013  . Chest pain    December, 2013  . Dizziness   . Gout   . Hemorrhoid   . Hyperlipidemia   . Hypertension   . Skin cancer   . Spinal stenosis        PAST SURGICAL HISTORY: Past Surgical History:  Procedure Laterality Date  . 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 BELPHAROPTOSIS REPAIR Bilateral   . CATARACT EXTRACTION W/ INTRAOCULAR LENS  IMPLANT, BILATERAL    . COLONOSCOPY N/A 11/03/2014   Procedure: COLONOSCOPY;  Surgeon: Rogene Houston, MD;  Location: [facility];  Service: Endoscopy;  Laterality: N/A;  1225  . KNEE CARTILAGE SURGERY Left    Left knee  . SKIN CANCER EXCISION  12/2014, 04/25/15     FAMILY HISTORY:  Family History  Problem Relation Age of Onset  . Heart attack Father   . Heart failure Mother   . Parkinson's disease Mother   . Healthy Sister   . Skin cancer Sister   . Neuropathy Brother   . COPD Brother   .  Epilepsy Brother   . Healthy Sister   . Healthy Sister   . Colon cancer Neg Hx      SOCIAL HISTORY:  reports that he has quit smoking. His smoking use included cigarettes. He started smoking about 49 years ago. He has a 0.40 pack-year smoking history. He has never used smokeless tobacco. He reports that he does not drink alcohol or use drugs. The patient is married and lives in [place].   ALLERGIES: Trazodone and nefazodone   MEDICATIONS:  Current Outpatient Medications  Medication Sig Dispense Refill  . carvedilol (COREG) 12.5 MG tablet TAKE 1 AND 1/2 TABLETS BY MOUTH TWO TIMES DAILY WITH A MEAL 270 tablet 3  . clonazePAM (KLONOPIN) 0.5 MG tablet Take 1 tablet (0.5 mg total) by mouth 2 (two) times daily as needed  for anxiety. 10 tablet 0  . IRON, FERROUS GLUCONATE, PO Take by mouth daily.    . Multiple Vitamin (MULTIVITAMIN) tablet Take 1 tablet by mouth daily.    . pravastatin (PRAVACHOL) 40 MG tablet Take 1 tablet (40 mg total) by mouth daily. 90 tablet 3  . aspirin 81 MG tablet Take 81 mg by mouth daily.     No current facility-administered medications for this encounter.      REVIEW OF SYSTEMS: On review of systems, the patient reports that he is doing okay. He reports feeling more tired and fatigued. He states that he sweats profusely. He has had several episodes of hemoptysis and the most recent was about 1 week ago. This was significantly better than the episode about a month ago. He denies any fevers, chills, night sweats, unintended weight changes. He denies any bowel or bladder disturbances, and denies abdominal pain, nausea or vomiting. He denies any new musculoskeletal or joint aches or pains. When he walks, he has noticed some left upper extremity numbness. A complete review of systems is obtained and is otherwise negative.  PHYSICAL EXAM:  Wt Readings from Last 3 Encounters:  04/24/18 237 lb 9.6 oz (107.8 kg)  04/22/18 239 lb 8 oz (108.6 kg)  04/08/18 241 lb (109.3 kg)   Temp Readings from Last 3 Encounters:  04/24/18 98.4 F (36.9 C) (Oral)  04/22/18 98.7 F (37.1 C)  04/08/18 97.9 F (36.6 C) (Oral)   BP Readings from Last 3 Encounters:  04/24/18 134/74  04/22/18 133/77  04/08/18 124/82   Pulse Readings from Last 3 Encounters:  04/24/18 65  04/22/18 94  04/08/18 60   Pain Assessment Pain Score: 0-No pain/10  In general this is a well appearing caucasian male in no acute distress. He is alert and oriented x4 and appropriate throughout the examination. HEENT reveals that the patient is normocephalic, atraumatic. EOMs are intact. Skin is intact without any evidence of gross lesions. prior excision sites from his surgical biopsies in the left scapular, left axillary, and  right chest wall were each noted. no visible skin lesions are otherwise seen. Cardiovascular exam reveals a regular rate and rhythm, no clicks rubs or murmurs are auscultated. Chest is clear to auscultation in the left lung fields. On the right however, there is expiratory wheezing heard over the entire right lung field and with decreased breath sounds at the right base. Lymphatic assessment is performed and does not reveal any adenopathy in the cervical or supraclavicular, though there is fullness in the right supraclavicular region with a history of prior benign mass likely lipoma. He does have a palpable fullness in the anterior aspect of the left axilla  consistent with his CT imaging. He does not have any edema of the LUE. He does not have any palpable mass in the right axilla. Abdomen has active bowel sounds in all quadrants and is intact. The abdomen is soft, non tender, non distended. Lower extremities are negative for pretibial pitting edema bilaterally.   ECOG = 1  0 - Asymptomatic (Fully active, able to carry on all predisease activities without restriction)  1 - Symptomatic but completely ambulatory (Restricted in physically strenuous activity but ambulatory and able to carry out work of a light or sedentary nature. For example, light housework, office work)  2 - Symptomatic, <50% in bed during the day (Ambulatory and capable of all self care but unable to carry out any work activities. Up and about more than 50% of waking hours)  3 - Symptomatic, >50% in bed, but not bedbound (Capable of only limited self-care, confined to bed or chair 50% or more of waking hours)  4 - Bedbound (Completely disabled. Cannot carry on any self-care. Totally confined to bed or chair)  5 - Death   Eustace Pen MM, Creech RH, Tormey DC, et al. [phone]). "Toxicity and response criteria of the [facility]". [place] Oncol. 5 (6): 649-55    LABORATORY DATA:  Lab Results  Component Value Date    WBC 7.5 04/22/2018   HGB 12.2 (L) 04/22/2018   HCT 37.2 (L) 04/22/2018   MCV 90.3 04/22/2018   PLT 195 04/22/2018   Lab Results  Component Value Date   NA 141 04/22/2018   K 4.6 04/22/2018   CL 108 04/22/2018   CO2 24 04/22/2018   Lab Results  Component Value Date   ALT 9 04/22/2018   AST 13 (L) 04/22/2018   ALKPHOS 66 04/22/2018   BILITOT 0.5 04/22/2018      RADIOGRAPHY: Dg Ankle Complete Left  Result Date: 03/26/2018 CLINICAL DATA:  Left ankle pain and swelling for several weeks. No reported injury. EXAM: LEFT ANKLE COMPLETE - 3+ VIEW COMPARISON:  None. FINDINGS: Mild lateral left ankle soft tissue swelling. No fracture or subluxation. No suspicious focal osseous lesions. Tiny marginal osteophytes in the left ankle joint. Small Achilles left calcaneal spur. No radiopaque foreign body. IMPRESSION: Mild lateral left ankle soft tissue swelling, with no fracture or subluxation. 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 Small Achilles left calcaneal spur. Electronically Signed   By: Ilona Sorrel M.D.   On: 03/26/2018 14:06   Ct Chest High Resolution  Result Date: 04/16/2018 CLINICAL DATA:  72 year old male with history of wheezing for the past 4 months with shortness of breath. Three episodes of hemoptysis. EXAM: CT CHEST WITHOUT CONTRAST TECHNIQUE: Multidetector CT imaging of the chest was performed following the standard protocol without intravenous contrast. High resolution imaging of the lungs, as well as inspiratory and expiratory imaging, was performed. COMPARISON:  Chest CT 08/20/2007. FINDINGS: Cardiovascular: Heart size is normal. There is no significant pericardial fluid, thickening or pericardial calcification. There is aortic atherosclerosis, as well as atherosclerosis of the great vessels of the mediastinum and the coronary arteries, including calcified atherosclerotic plaque in the left main, left anterior descending, left circumflex and right coronary arteries.  Mediastinum/Nodes: Prominent soft tissue in the right hilar region contiguous with the right lower lobe mass (discussed below), at least part of which is presumably right hilar lymphadenopathy. No mediastinal or definite left hilar lymphadenopathy. Esophagus is unremarkable in appearance. In the left axillary region there is an irregular-shaped soft tissue mass  with macrolobulated and spiculated margins measuring 3.5 x 3.3 x 3.3 cm (axial image 16 of series 3 and coronal image 36 of series 9). Lungs/Pleura: In the central aspect of the superior segment of the right lower lobe there is a large mass which is estimated to measure 3.7 x 7.1 x 3.6 cm (axial image 81 of series 7 and coronal image 62 of series 9). This mass comes in direct contact with the bronchus intermedius and right lower lobe bronchi, where some of the soft tissue from the lesion appears to slightly protrude into and narrows the lumen (best appreciated on axial image 79 of series 7). Adjacent pleural thickening posteriorly to the right lower lobe mass, and some surrounding postobstructive inflammatory changes in the basal segments of the right lower lobe. In the anterior aspect of the left upper lobe there is an additional large pulmonary nodule (axial image 78 of series 7 and coronal image 27 of series 9) which measures 2.7 x 2.2 x 1.9 cm and also demonstrates macrolobulated and spiculated margins with retraction of the overlying pleura. No acute consolidative airspace disease. No left pleural effusion. High-resolution images demonstrate no significant regions of ground-glass attenuation, subpleural reticulation, parenchymal banding, traction bronchiectasis or frank honeycombing. Inspiratory and expiratory imaging demonstrates moderate air trapping predominantly in the right lower lobe, presumably secondary to the centrally obstructing mass. Upper Abdomen: Small nonobstructive calculi measuring 2-4 mm are noted in both renal collecting systems.  Aortic atherosclerosis. Musculoskeletal: There are no aggressive appearing lytic or blastic lesions noted in the visualized portions of the skeleton. IMPRESSION: 1. No evidence of interstitial lung disease. 2. 3.7 x 7.1 x 3.6 cm mass centered in the superior segment of the right lower lobe with probable right hilar lymphadenopathy and bronchial invasion, as discussed above. Findings are highly concerning for primary bronchogenic neoplasm. 3. In addition, there is a 2.7 x 2.2 x 1.9 cm suspicious appearing nodule in the left upper lobe which likely represents a synchronous primary bronchogenic neoplasm. 4. In addition, there is a malignant appearing soft tissue mass in the left axilla measuring 3.5 x 3.3 x 3.3 cm. This could represent a nodal metastasis, but this would be highly unusual from a primary bronchogenic neoplasm. The possibility of a nodal metastasis from a lesion on the left upper extremity should be considered, and correlation with cutaneous physical examination is recommended. 5. For further evaluation of findings 2-4 above further evaluation with PET-CT is recommended in the near future. 6. Aortic atherosclerosis, in addition to left main and 3 vessel coronary artery disease. Assessment for potential risk factor modification, dietary therapy or pharmacologic therapy may be warranted, if clinically indicated. 7. Nonobstructive calculi measuring 2-4 mm in the collecting systems of both kidneys. Aortic Atherosclerosis (ICD10-I70.0). Electronically Signed   By: Vinnie Langton M.D.   On: 04/16/2018 22:13       IMPRESSION/PLAN: 1. RLL mass concerning for malignancy resulting in hemoptysis. Dr. Lisbeth Renshaw discusses the pathology findings and reviews the nature of primary and secondary lung tumors. The working diagnosis is either a primary lung cancer versus a metastatic melanoma. We agree with proceeding with his ultrasound guided biopsy of the left axilla. We also agree with completing the PET imaging and  MRI of the brain. He is going to keep his appointment with Dr. Lake Bells as well. We discussed the risks, benefits, short, and long term effects of radiotherapy to the RLL mass as well as to the left axilla, and the patient is interested in proceeding. Dr.  Lisbeth Renshaw discusses the delivery and logistics of radiotherapy and anticipates a course of 3 weeks of radiotherapy. Written consent is obtained and placed in the chart, a copy was provided to the patient. He will return for simulation today at 1:15 pm.  2. Left axillary adenopathy. As above. Dr. Lisbeth Renshaw recommends proceeding with radiotherapy as above. 3. Left upper lobe mass. We will avoid treatment to this site so that Dr. Julien Nordmann has a reference site to compare for systemic response.   The above documentation reflects my direct findings during this shared patient visit. Please see the separate note by Dr. Lisbeth Renshaw on this date for the remainder of the patient's plan of care.    Carola Rhine, PAC

## 2018-04-24 NOTE — Telephone Encounter (Signed)
Radiation Question-Pt  read on xrt consent that there may be a risk for swelling in the LN after the xrt . He is asking if he has this  swelling in the LN can it be corrected with for example , Immunotherapy?  No immunotherapy has been recommended at this time.

## 2018-04-25 ENCOUNTER — Encounter: Payer: Self-pay | Admitting: *Deleted

## 2018-04-25 NOTE — Progress Notes (Signed)
[facility] Psychosocial Distress Screening Clinical Social Work  Clinical Social Work was referred by distress screening protocol.  The patient scored a 5 on the Psychosocial Distress Thermometer which indicates moderate distress. Clinical Social Worker contacted patient by phone to assess for distress and other psychosocial needs. [name] shared he recently full relocated from [place] to [place] and has a son that resides in [place].  [name] reported the diagnosis process is moving quickly, but he's still dealing with anxiety surrounding the uncertainty of his situation.  CSW provided brief emotional support by active listening and validating/normalizing patient's feelings of anxiety.  CSW discussed many layers of support including counseling, support groups, yoga/meditation, massage, and other wellness classes.  [name] was interested and requested more information.  CSW made a packet and will leave at front desk for patient to pick up.  ONCBCN DISTRESS SCREENING 04/24/2018  Screening Type Initial Screening  Distress experienced in past week (1-10) 5  Practical problem type Housing  Emotional problem type Nervousness/Anxiety;Adjusting to illness;Isolation/feeling alone  Spiritual/Religous concerns type Facing my mortality  Information Concerns Type Lack of info about diagnosis;Lack of info about treatment;Lack of info about complementary therapy choices;Lack of info about maintaining fitness  Physical Problem type Pain;Sleep/insomnia;Breathing;Mouth sores/swallowing;Loss of appetitie  Other [phone] home, [phone] cell     Gwinda Maine, LCSW

## 2018-04-28 ENCOUNTER — Other Ambulatory Visit: Payer: Self-pay | Admitting: Radiology

## 2018-04-29 ENCOUNTER — Ambulatory Visit (HOSPITAL_COMMUNITY)
Admission: RE | Admit: 2018-04-29 | Discharge: 2018-04-29 | Disposition: A | Discharge status: Home / Self Care | Payer: Medicare Other | Source: Ambulatory Visit | Attending: Internal Medicine | Admitting: Internal Medicine

## 2018-04-29 ENCOUNTER — Telehealth: Payer: Self-pay | Admitting: Radiation Oncology

## 2018-04-29 ENCOUNTER — Encounter: Payer: Medicare Other | Admitting: Nutrition

## 2018-04-29 DIAGNOSIS — C7802 Secondary malignant neoplasm of left lung: Secondary | ICD-10-CM | POA: Diagnosis not present

## 2018-04-29 DIAGNOSIS — C801 Malignant (primary) neoplasm, unspecified: Secondary | ICD-10-CM | POA: Diagnosis not present

## 2018-04-29 DIAGNOSIS — Z510 Encounter for antineoplastic radiation therapy: Secondary | ICD-10-CM | POA: Diagnosis not present

## 2018-04-29 DIAGNOSIS — C3490 Malignant neoplasm of unspecified part of unspecified bronchus or lung: Secondary | ICD-10-CM | POA: Insufficient documentation

## 2018-04-29 DIAGNOSIS — C773 Secondary and unspecified malignant neoplasm of axilla and upper limb lymph nodes: Secondary | ICD-10-CM | POA: Diagnosis not present

## 2018-04-29 DIAGNOSIS — C779 Secondary and unspecified malignant neoplasm of lymph node, unspecified: Secondary | ICD-10-CM | POA: Diagnosis not present

## 2018-04-29 DIAGNOSIS — C3431 Malignant neoplasm of lower lobe, right bronchus or lung: Secondary | ICD-10-CM | POA: Diagnosis not present

## 2018-04-29 DIAGNOSIS — R229 Localized swelling, mass and lump, unspecified: Secondary | ICD-10-CM | POA: Diagnosis not present

## 2018-04-29 DIAGNOSIS — C7801 Secondary malignant neoplasm of right lung: Secondary | ICD-10-CM | POA: Diagnosis not present

## 2018-04-29 DIAGNOSIS — Z87891 Personal history of nicotine dependence: Secondary | ICD-10-CM | POA: Diagnosis not present

## 2018-04-29 MED ORDER — LIDOCAINE HCL 1 % IJ SOLN
INTRAMUSCULAR | Status: AC
  Filled 2018-04-29: qty 20

## 2018-04-29 NOTE — Telephone Encounter (Signed)
-----   Message from Kerri Perches sent at 04/29/2018  2:11 PM EDT ----- Regarding: PHONE CALL Heather Roberts,   The above patient has a question for you.  Could you please call Mr. Cumpston, his phone number is [phone].   Thanks,  [employer]

## 2018-04-29 NOTE — Telephone Encounter (Signed)
I called the patient back and LM for him to call me back.

## 2018-04-29 NOTE — Progress Notes (Signed)
Patient ID: Travis Padilla, male   DOB: [birth year], 72 y.o.   MRN: [id number] Patient scheduled for left axillary lymph node biopsy today.  Details/risks of procedure, including but not limited to, internal bleeding, infection, injury to adjacent structures, need for additional testing discussed with patient and spouse with their understanding and consent.  Patient requests no sedation.

## 2018-04-29 NOTE — Procedures (Signed)
L ax LN core Bx 18 g times four EBL 0 Comp 0

## 2018-04-30 ENCOUNTER — Ambulatory Visit
Admission: RE | Admit: 2018-04-30 | Discharge: 2018-04-30 | Disposition: A | Discharge status: Home / Self Care | Payer: Medicare Other | Source: Ambulatory Visit | Attending: Radiation Oncology | Admitting: Radiation Oncology

## 2018-04-30 ENCOUNTER — Telehealth: Payer: Self-pay | Admitting: Radiation Oncology

## 2018-04-30 DIAGNOSIS — Z87891 Personal history of nicotine dependence: Secondary | ICD-10-CM | POA: Diagnosis not present

## 2018-04-30 DIAGNOSIS — R918 Other nonspecific abnormal finding of lung field: Secondary | ICD-10-CM

## 2018-04-30 DIAGNOSIS — C7802 Secondary malignant neoplasm of left lung: Secondary | ICD-10-CM | POA: Diagnosis not present

## 2018-04-30 DIAGNOSIS — C773 Secondary and unspecified malignant neoplasm of axilla and upper limb lymph nodes: Secondary | ICD-10-CM | POA: Diagnosis not present

## 2018-04-30 DIAGNOSIS — C3431 Malignant neoplasm of lower lobe, right bronchus or lung: Secondary | ICD-10-CM | POA: Diagnosis not present

## 2018-04-30 DIAGNOSIS — C7801 Secondary malignant neoplasm of right lung: Secondary | ICD-10-CM | POA: Diagnosis not present

## 2018-04-30 DIAGNOSIS — Z510 Encounter for antineoplastic radiation therapy: Secondary | ICD-10-CM | POA: Diagnosis not present

## 2018-04-30 MED ORDER — SONAFINE EX EMUL
1.0000 "application " | Freq: Once | CUTANEOUS | Status: AC
  Administered 2018-04-30: 1 via TOPICAL

## 2018-04-30 NOTE — Progress Notes (Signed)
Pt here for patient teaching.  Pt given Radiation and You booklet and Sonafine.  Reviewed areas of pertinence such as fatigue, hair loss, skin changes and throat changes . Pt able to give teach back of to pat skin,apply Sonafine bid and avoid applying anything to skin within 4 hours of treatment. Pt verbalizes understanding of information given and will contact nursing with any questions or concerns.    Gloriajean Dell. Leonie Green, BSN

## 2018-05-01 ENCOUNTER — Telehealth: Payer: Self-pay | Admitting: *Deleted

## 2018-05-01 ENCOUNTER — Ambulatory Visit
Admission: RE | Admit: 2018-05-01 | Discharge: 2018-05-01 | Disposition: A | Discharge status: Home / Self Care | Payer: Medicare Other | Source: Ambulatory Visit | Attending: Radiation Oncology | Admitting: Radiation Oncology

## 2018-05-01 DIAGNOSIS — C3431 Malignant neoplasm of lower lobe, right bronchus or lung: Secondary | ICD-10-CM | POA: Diagnosis not present

## 2018-05-01 DIAGNOSIS — C773 Secondary and unspecified malignant neoplasm of axilla and upper limb lymph nodes: Secondary | ICD-10-CM | POA: Diagnosis not present

## 2018-05-01 DIAGNOSIS — C7802 Secondary malignant neoplasm of left lung: Secondary | ICD-10-CM | POA: Diagnosis not present

## 2018-05-01 DIAGNOSIS — Z510 Encounter for antineoplastic radiation therapy: Secondary | ICD-10-CM | POA: Diagnosis not present

## 2018-05-01 DIAGNOSIS — C7801 Secondary malignant neoplasm of right lung: Secondary | ICD-10-CM | POA: Diagnosis not present

## 2018-05-01 DIAGNOSIS — Z87891 Personal history of nicotine dependence: Secondary | ICD-10-CM | POA: Diagnosis not present

## 2018-05-01 NOTE — Telephone Encounter (Signed)
Oncology Nurse Navigator Documentation  Oncology Nurse Navigator Flowsheets 05/01/2018  Navigator Location [facility]  Navigator Encounter Type Telephone/I received a call from [employer]. She states patient has questions about next steps with Dr. Julien Nordmann. I called patient and was unable to reach him. I did leave my name and phone number to call.   Telephone Outgoing Call  Confirmed Diagnosis Date 04/29/2018  Patient Visit Type MedOnc  Treatment Phase Pre-Tx/Tx Discussion  Barriers/Navigation Needs Coordination of Care  Interventions Coordination of Care  Coordination of Care Other  Acuity Level 2  Time Spent with Patient 49

## 2018-05-01 NOTE — Telephone Encounter (Signed)
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 Unable yet to distinguish source. We will wait on final path, but proceed today with his radiotherapy to the axilla and chest.

## 2018-05-02 ENCOUNTER — Ambulatory Visit
Admission: RE | Admit: 2018-05-02 | Discharge: 2018-05-02 | Disposition: A | Discharge status: Home / Self Care | Payer: Medicare Other | Source: Ambulatory Visit | Attending: Radiation Oncology | Admitting: Radiation Oncology

## 2018-05-02 DIAGNOSIS — C773 Secondary and unspecified malignant neoplasm of axilla and upper limb lymph nodes: Secondary | ICD-10-CM | POA: Diagnosis not present

## 2018-05-02 DIAGNOSIS — Z87891 Personal history of nicotine dependence: Secondary | ICD-10-CM | POA: Diagnosis not present

## 2018-05-02 DIAGNOSIS — C7802 Secondary malignant neoplasm of left lung: Secondary | ICD-10-CM | POA: Diagnosis not present

## 2018-05-02 DIAGNOSIS — C3431 Malignant neoplasm of lower lobe, right bronchus or lung: Secondary | ICD-10-CM | POA: Diagnosis not present

## 2018-05-02 DIAGNOSIS — Z510 Encounter for antineoplastic radiation therapy: Secondary | ICD-10-CM | POA: Diagnosis not present

## 2018-05-02 DIAGNOSIS — C7801 Secondary malignant neoplasm of right lung: Secondary | ICD-10-CM | POA: Diagnosis not present

## 2018-05-04 ENCOUNTER — Other Ambulatory Visit: Payer: Self-pay | Admitting: Cardiology

## 2018-05-05 ENCOUNTER — Encounter (HOSPITAL_COMMUNITY)
Admission: RE | Admit: 2018-05-05 | Discharge: 2018-05-05 | Disposition: A | Discharge status: Home / Self Care | Payer: Medicare Other | Source: Ambulatory Visit | Attending: Internal Medicine | Admitting: Internal Medicine

## 2018-05-05 ENCOUNTER — Ambulatory Visit
Admission: RE | Admit: 2018-05-05 | Discharge: 2018-05-05 | Disposition: A | Discharge status: Home / Self Care | Payer: Medicare Other | Source: Ambulatory Visit | Attending: Radiation Oncology | Admitting: Radiation Oncology

## 2018-05-05 ENCOUNTER — Ambulatory Visit (HOSPITAL_COMMUNITY)
Admission: RE | Admit: 2018-05-05 | Discharge: 2018-05-05 | Disposition: A | Discharge status: Home / Self Care | Payer: Medicare Other | Source: Ambulatory Visit | Attending: Internal Medicine | Admitting: Internal Medicine

## 2018-05-05 ENCOUNTER — Encounter (HOSPITAL_COMMUNITY): Payer: Self-pay

## 2018-05-05 DIAGNOSIS — C3490 Malignant neoplasm of unspecified part of unspecified bronchus or lung: Secondary | ICD-10-CM

## 2018-05-05 DIAGNOSIS — R590 Localized enlarged lymph nodes: Secondary | ICD-10-CM | POA: Insufficient documentation

## 2018-05-05 DIAGNOSIS — C3431 Malignant neoplasm of lower lobe, right bronchus or lung: Secondary | ICD-10-CM | POA: Diagnosis not present

## 2018-05-05 DIAGNOSIS — C7802 Secondary malignant neoplasm of left lung: Secondary | ICD-10-CM | POA: Diagnosis not present

## 2018-05-05 DIAGNOSIS — Z510 Encounter for antineoplastic radiation therapy: Secondary | ICD-10-CM | POA: Diagnosis not present

## 2018-05-05 DIAGNOSIS — C7801 Secondary malignant neoplasm of right lung: Secondary | ICD-10-CM | POA: Diagnosis not present

## 2018-05-05 DIAGNOSIS — R918 Other nonspecific abnormal finding of lung field: Secondary | ICD-10-CM | POA: Diagnosis not present

## 2018-05-05 DIAGNOSIS — R911 Solitary pulmonary nodule: Secondary | ICD-10-CM | POA: Insufficient documentation

## 2018-05-05 DIAGNOSIS — C773 Secondary and unspecified malignant neoplasm of axilla and upper limb lymph nodes: Secondary | ICD-10-CM | POA: Diagnosis not present

## 2018-05-05 DIAGNOSIS — Z87891 Personal history of nicotine dependence: Secondary | ICD-10-CM | POA: Diagnosis not present

## 2018-05-05 LAB — GLUCOSE, CAPILLARY: GLUCOSE-CAPILLARY: 93 mg/dL (ref 70–99)

## 2018-05-05 MED ORDER — FLUDEOXYGLUCOSE F - 18 (FDG) INJECTION
11.8200 | Freq: Once | INTRAVENOUS | Status: AC | PRN
  Administered 2018-05-05: 11.82 via INTRAVENOUS

## 2018-05-05 NOTE — Progress Notes (Signed)
Pt was unable to do MRI, states he will speak to the Dr. To either get meds or a bigger scanner.

## 2018-05-06 ENCOUNTER — Telehealth: Payer: Self-pay | Admitting: *Deleted

## 2018-05-06 ENCOUNTER — Encounter: Payer: Self-pay | Admitting: *Deleted

## 2018-05-06 ENCOUNTER — Other Ambulatory Visit: Payer: Self-pay | Admitting: Internal Medicine

## 2018-05-06 ENCOUNTER — Other Ambulatory Visit: Payer: Self-pay | Admitting: *Deleted

## 2018-05-06 ENCOUNTER — Ambulatory Visit
Admission: RE | Admit: 2018-05-06 | Discharge: 2018-05-06 | Disposition: A | Discharge status: Home / Self Care | Payer: Medicare Other | Source: Ambulatory Visit | Attending: Radiation Oncology | Admitting: Radiation Oncology

## 2018-05-06 DIAGNOSIS — C3490 Malignant neoplasm of unspecified part of unspecified bronchus or lung: Secondary | ICD-10-CM

## 2018-05-06 DIAGNOSIS — C773 Secondary and unspecified malignant neoplasm of axilla and upper limb lymph nodes: Secondary | ICD-10-CM | POA: Diagnosis not present

## 2018-05-06 DIAGNOSIS — C7802 Secondary malignant neoplasm of left lung: Secondary | ICD-10-CM | POA: Diagnosis not present

## 2018-05-06 DIAGNOSIS — R51 Headache: Secondary | ICD-10-CM

## 2018-05-06 DIAGNOSIS — C7801 Secondary malignant neoplasm of right lung: Secondary | ICD-10-CM | POA: Diagnosis not present

## 2018-05-06 DIAGNOSIS — Z510 Encounter for antineoplastic radiation therapy: Secondary | ICD-10-CM | POA: Diagnosis not present

## 2018-05-06 DIAGNOSIS — R519 Headache, unspecified: Secondary | ICD-10-CM

## 2018-05-06 DIAGNOSIS — C3431 Malignant neoplasm of lower lobe, right bronchus or lung: Secondary | ICD-10-CM | POA: Diagnosis not present

## 2018-05-06 DIAGNOSIS — Z87891 Personal history of nicotine dependence: Secondary | ICD-10-CM | POA: Diagnosis not present

## 2018-05-06 NOTE — Progress Notes (Signed)
Oncology Nurse Navigator Documentation  Oncology Nurse Navigator Flowsheets 05/06/2018  Navigator Location [facility]  Navigator Encounter Type Telephone/Travis Padilla called. He states he could not do MRI Brain yesterday.  I updated Dr. Julien Nordmann and vo received of CT head with and without cancel MRI Brain.  Will complete order and update patient.   Telephone Outgoing Call  Treatment Phase Treatment  Barriers/Navigation Needs Education;Coordination of Care  Education Other  Interventions Coordination of Care;Education  Coordination of Care Other  Education Method Verbal  Acuity Level 2  Time Spent with Patient 81

## 2018-05-06 NOTE — Telephone Encounter (Signed)
Called and updated patient

## 2018-05-07 ENCOUNTER — Encounter: Payer: Self-pay | Admitting: Internal Medicine

## 2018-05-07 ENCOUNTER — Inpatient Hospital Stay (HOSPITAL_BASED_OUTPATIENT_CLINIC_OR_DEPARTMENT_OTHER): Payer: Medicare Other | Admitting: Internal Medicine

## 2018-05-07 ENCOUNTER — Telehealth: Payer: Self-pay | Admitting: Medical Oncology

## 2018-05-07 ENCOUNTER — Encounter: Payer: Self-pay | Admitting: Pulmonary Disease

## 2018-05-07 ENCOUNTER — Ambulatory Visit
Admission: RE | Admit: 2018-05-07 | Discharge: 2018-05-07 | Disposition: A | Discharge status: Home / Self Care | Payer: Medicare Other | Source: Ambulatory Visit | Attending: Radiation Oncology | Admitting: Radiation Oncology

## 2018-05-07 ENCOUNTER — Telehealth: Payer: Self-pay | Admitting: Pulmonary Disease

## 2018-05-07 ENCOUNTER — Ambulatory Visit (INDEPENDENT_AMBULATORY_CARE_PROVIDER_SITE_OTHER): Payer: Medicare Other | Admitting: Pulmonary Disease

## 2018-05-07 ENCOUNTER — Telehealth: Payer: Self-pay

## 2018-05-07 ENCOUNTER — Encounter: Payer: Self-pay | Admitting: Medical Oncology

## 2018-05-07 VITALS — BP 128/79 | HR 68 | Temp 98.7°F | Resp 18 | Ht 73.0 in | Wt 236.3 lb

## 2018-05-07 VITALS — BP 130/92 | HR 67 | Ht 73.75 in | Wt 237.0 lb

## 2018-05-07 DIAGNOSIS — C3491 Malignant neoplasm of unspecified part of right bronchus or lung: Secondary | ICD-10-CM | POA: Diagnosis not present

## 2018-05-07 DIAGNOSIS — C773 Secondary and unspecified malignant neoplasm of axilla and upper limb lymph nodes: Secondary | ICD-10-CM

## 2018-05-07 DIAGNOSIS — Z87891 Personal history of nicotine dependence: Secondary | ICD-10-CM | POA: Diagnosis not present

## 2018-05-07 DIAGNOSIS — R222 Localized swelling, mass and lump, trunk: Secondary | ICD-10-CM | POA: Diagnosis not present

## 2018-05-07 DIAGNOSIS — E785 Hyperlipidemia, unspecified: Secondary | ICD-10-CM

## 2018-05-07 DIAGNOSIS — F419 Anxiety disorder, unspecified: Secondary | ICD-10-CM

## 2018-05-07 DIAGNOSIS — M109 Gout, unspecified: Secondary | ICD-10-CM | POA: Diagnosis not present

## 2018-05-07 DIAGNOSIS — J45909 Unspecified asthma, uncomplicated: Secondary | ICD-10-CM | POA: Diagnosis not present

## 2018-05-07 DIAGNOSIS — Z7982 Long term (current) use of aspirin: Secondary | ICD-10-CM

## 2018-05-07 DIAGNOSIS — C7802 Secondary malignant neoplasm of left lung: Secondary | ICD-10-CM | POA: Diagnosis not present

## 2018-05-07 DIAGNOSIS — I700 Atherosclerosis of aorta: Secondary | ICD-10-CM

## 2018-05-07 DIAGNOSIS — J449 Chronic obstructive pulmonary disease, unspecified: Secondary | ICD-10-CM | POA: Diagnosis not present

## 2018-05-07 DIAGNOSIS — R0600 Dyspnea, unspecified: Secondary | ICD-10-CM

## 2018-05-07 DIAGNOSIS — Z79899 Other long term (current) drug therapy: Secondary | ICD-10-CM

## 2018-05-07 DIAGNOSIS — R5383 Other fatigue: Secondary | ICD-10-CM | POA: Diagnosis not present

## 2018-05-07 DIAGNOSIS — I10 Essential (primary) hypertension: Secondary | ICD-10-CM

## 2018-05-07 DIAGNOSIS — I2510 Atherosclerotic heart disease of native coronary artery without angina pectoris: Secondary | ICD-10-CM | POA: Diagnosis not present

## 2018-05-07 DIAGNOSIS — R918 Other nonspecific abnormal finding of lung field: Secondary | ICD-10-CM | POA: Diagnosis not present

## 2018-05-07 DIAGNOSIS — R042 Hemoptysis: Secondary | ICD-10-CM | POA: Diagnosis not present

## 2018-05-07 DIAGNOSIS — C3431 Malignant neoplasm of lower lobe, right bronchus or lung: Secondary | ICD-10-CM | POA: Diagnosis not present

## 2018-05-07 DIAGNOSIS — R0789 Other chest pain: Secondary | ICD-10-CM | POA: Diagnosis not present

## 2018-05-07 DIAGNOSIS — Z510 Encounter for antineoplastic radiation therapy: Secondary | ICD-10-CM | POA: Diagnosis not present

## 2018-05-07 DIAGNOSIS — Z85828 Personal history of other malignant neoplasm of skin: Secondary | ICD-10-CM

## 2018-05-07 DIAGNOSIS — C7801 Secondary malignant neoplasm of right lung: Secondary | ICD-10-CM | POA: Diagnosis not present

## 2018-05-07 MED ORDER — ALBUTEROL SULFATE (2.5 MG/3ML) 0.083% IN NEBU: 2.5000 mg | INHALATION_SOLUTION | Freq: Four times a day (QID) | RESPIRATORY_TRACT | 12 refills | Status: DC | PRN

## 2018-05-07 MED ORDER — ALPRAZOLAM 0.25 MG PO TABS: 0.2500 mg | ORAL_TABLET | Freq: Every evening | ORAL | 0 refills | Status: DC | PRN

## 2018-05-07 NOTE — Patient Instructions (Signed)
Shortness of breath: Use albuterol as needed every 4-6 hours with short for shortness of breath Exercise more We will arrange for a lung function test to see if there is something else going on I like to see how you are doing in 2 to 3 weeks after treatment of the lung mass If no improvement with the bronchodilator and cancer treatment then we may consider a bronchoscopy to see if the airway narrowing is more significant than shown on the CT earlier this month.  Follow-up with me in 2 to 3 weeks or sooner if needed

## 2018-05-07 NOTE — Telephone Encounter (Signed)
Spoke with [employer] from pharmacy, they need a more specific diagnosis to feel patient's nebulizer and supplies. Travis Padilla states they need a specific dx such a Asthma or bronchiectasis, etc.  BQ please advise as to what diagnosis you would like [place] to use for his nebulizer and supplies. Thanks.

## 2018-05-07 NOTE — Telephone Encounter (Signed)
Printed  avs and calender of upcoming appointment. Per 8/28 los 

## 2018-05-07 NOTE — Progress Notes (Addendum)
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  Subjective:   PATIENT ID: Travis Padilla GENDER: male DOB: 01-Sep-1946, MRN: [id number]   HPI  Chief Complaint  Patient presents with  . New Consult   Travis Padilla is here to see me today for shortness of breath.  He says that he had a normal childhood without respiratory illnesses and he has no family history of lung problems.  He smoked briefly between his late teenage years and early 40s and then quit.  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  He says that he never had frequent heavy chemical or dust exposures though from time to time he may be exposed to some solvents which were used to make fiberglass.  He says that he kept a regular heavy exercise routine several times per week and never had any difficulty with this.  However, in early 2019 he developed shortness of breath and wheezing.  He says that he was evaluated both here as well as at [facility] as that he and his wife are working on a home in [place].  While in [place] he had a lung function test, exhaled nitric oxide test and a methacholine challenge test.  The methacholine challenge test was negative though he was treated with Symbicort.  He says that the Symbicort never really did much for him in terms of breathing.  He has been found to have a right lower lobe mass as well as a lingular lesion.  A needle biopsy showed squamous cell carcinoma.  He is currently undergoing radiation therapy and he is supposed to see Dr. Earlie Server today for discussion of further treatment.  He tells me that he does not cough though at the onset of his discovery of having lung cancer he did have some hemoptysis.  That has not recurred.  He says that he feels  wheezing but no chest congestion and has no mucus production.  He says that he feels shortness of breath when he exerts himself.  He has a slight amount of pain in his calves but no swelling that he can tell.  Past Medical History:  Diagnosis Date  . Abnormal nuclear stress test    December, 2013  . Anemia   . CAD (coronary artery disease)    Mild nonobstructive plaque in cath 2013  . Chest pain    December, 2013  . Dizziness   . Gout   . Hemorrhoid   . Hyperlipidemia   . Hypertension   . Skin cancer   . Spinal stenosis      Family History  Problem Relation Age of Onset  . Heart attack Father   . Heart failure Mother   . Parkinson's disease Mother   . Healthy Sister   . Skin cancer Sister   . Neuropathy Brother   . COPD Brother   . Epilepsy Brother   . Healthy Sister   . Healthy Sister   . Colon cancer Neg Hx      Social History   Socioeconomic History  . Marital status: Married    Spouse name: Not on file  . Number of children: 1  . Years of education: Not on file  . Highest education level: Not on file  Occupational History  . Occupation: Government social research officer  Social Needs  . Financial resource strain: Not on file  . Food insecurity:    Worry: Not on file    Inability: Not on file  . Transportation needs:    Medical: Not on file    Non-medical: Not on file  Tobacco Use  . Smoking status: Former Smoker    Packs/day: 0.20    Years: 2.00    Pack years: 0.40    Types: Cigarettes    Start date: 10/01/1968  . Smokeless tobacco: Never Used  Substance and Sexual Activity  . Alcohol use: No    Comment: hx of   . Drug use: No  . Sexual activity: Not on file  Lifestyle  . Physical activity:    Days per week: Not on file    Minutes per session: Not on file  . Stress: Not on file  Relationships  . Social connections:    Talks on phone: Not on file    Gets together: Not on file    Attends religious service: Not on file    Active member of club or  organization: Not on file    Attends meetings of clubs or organizations: Not on file    Relationship status: Not on file  . Intimate partner violence:    Fear of current or ex partner: Not on file    Emotionally abused: Not on file    Physically abused: Not on file    Forced sexual activity: Not on file  Other Topics Concern  . Not on file  Social History Narrative   Unable to ask intimate partner violence questions, wife present     Allergies  Allergen Reactions  . Trazodone And Nefazodone Palpitations     Outpatient Medications Prior to Visit  Medication Sig Dispense Refill  . carvedilol (COREG) 12.5 MG tablet TAKE 1 AND 1/2 TABLETS BY MOUTH TWO TIMES DAILY WITH A MEAL 270 tablet 3  . clonazePAM (KLONOPIN) 0.5 MG tablet Take 1 tablet (0.5 mg total) by mouth 2 (two) times daily as needed for anxiety. 10 tablet 0  . Glucosamine-Chondroitin (OSTEO BI-FLEX REGULAR STRENGTH PO) Take 1 tablet by mouth daily.    . IRON, FERROUS GLUCONATE, PO Take by mouth daily.    . Multiple Vitamin (MULTIVITAMIN) tablet Take 1 tablet by mouth daily.    . pravastatin (PRAVACHOL) 40 MG tablet TAKE 1 TABLET BY MOUTH EVERY DAY 90 tablet 3  . aspirin 81 MG tablet Take 81 mg by mouth daily.     No facility-administered medications prior to visit.     Review of Systems  Constitutional: Positive for weight loss. Negative for fever.  HENT: Negative for congestion, ear pain, nosebleeds and sore throat.   Eyes: Negative for redness.  Respiratory: Positive for cough, shortness of breath and wheezing.   Cardiovascular: Positive for palpitations. Negative for leg swelling and PND.  Gastrointestinal: Negative for nausea and vomiting.  Genitourinary: Negative for dysuria.  Skin: Negative for rash.  Neurological: Positive for headaches.  Endo/Heme/Allergies: Does not bruise/bleed easily.  Psychiatric/Behavioral: Positive for depression. The patient is nervous/anxious.       Objective:  Physical  Exam   Vitals:   05/07/18 1330  BP: (!) 130/92  Pulse: 67  SpO2: 96%  Weight: 237 lb (107.5 kg)  Height: 6' 1.75" (1.873 m)    Gen: well appearing, no acute distress HENT: NCAT, OP clear, neck supple without masses Eyes: PERRL, EOMi Lymph: no cervical lymphadenopathy  PULM: Wheezing R lung, clear on R CV: RRR, no mgr, no JVD GI: BS+, soft, nontender, no hsm Derm: no rash or skin breakdown MSK: normal bulk and tone Neuro: A&Ox4, CN II-XII intact, strength 5/5 in all 4 extremities Psyche: normal mood and affect   CBC    Component Value Date/Time   WBC 7.5 04/22/2018 1341   WBC 6.0 08/21/2012 1514   RBC 4.12 (L) 04/22/2018 1341   HGB 12.2 (L) 04/22/2018 1341   HGB 13.0 12/04/2017 1202   HCT 37.2 (L) 04/22/2018 1341   HCT 39.6 12/04/2017 1202   PLT 195 04/22/2018 1341   PLT 218 12/04/2017 1202   MCV 90.3 04/22/2018 1341   MCV 92 12/04/2017 1202   MCH 29.6 04/22/2018 1341   MCHC 32.8 04/22/2018 1341   RDW 12.9 04/22/2018 1341   RDW 13.8 12/04/2017 1202   LYMPHSABS 2.0 04/22/2018 1341   LYMPHSABS 1.8 12/04/2017 1202   MONOABS 0.9 04/22/2018 1341   EOSABS 0.2 04/22/2018 1341   EOSABS 0.2 12/04/2017 1202   BASOSABS 0.0 04/22/2018 1341   BASOSABS 0.0 12/04/2017 1202     Chest imaging: 04/2018 CT chest> Lingular nodule, RLL mass, RLL bronchiectasis, no emphysema of fibrosis, images personally reviewed  PFT: Spirometry test at the [facility] clinic in the spring 2019 showed moderate airflow obstruction with a ratio of 68% and FEV1 of 74% predicted Methacholine challenge test in the spring 2019 at [facility] clinic was negative Exhaled nitric oxide testing at [facility] clinic in the spring 2019 was 32 ppm  Labs:  Path: 8/20 TTNA: metastatic squamous cell carcinoma  Echo:  Heart Catheterization:  Records from oncology reviewed where he was seen for a lung mass and referred to IR for a biopsy     Assessment & Plan:   Dyspnea, unspecified type - Plan: Pulmonary  function test, Ambulatory Referral for DME  COPD with chronic bronchitis and emphysema ([place])  Lung mass  Discussion: Mr. Dickison is here to see me today for shortness of breath in the setting of a right lung mass due to squamous cell carcinoma.  Earlier this year he was seen at the [facility] clinic where he was found to have moderate airflow obstruction but had a negative methacholine challenge test.  On physical exam he has a focal wheeze in the right lung.  He has had no benefit to treatment with Symbicort with a spacer.  I think the most likely explanation for his shortness of breath is the right lower lobe mass causing obstruction of his airways.  There may be role in trying albuterol despite an absence of significant airflow obstruction as some people will have symptomatic relief from this.  The differential diagnosis of dyspnea is broad and in a gentleman with a recent diagnosis of malignancy blood clots and pulmonary hypertension is possible.  Plan: Shortness of breath: Use albuterol as needed every 4-6 hours with short for shortness of breath Exercise more We will arrange for a lung function test to see if there is something else going on I like to see how you are doing in 2 to 3 weeks after treatment of the lung mass If no improvement with the bronchodilator and cancer treatment then we may consider a bronchoscopy to see if the airway narrowing is more significant than shown on the CT earlier this month.  COPD: Moderate airflow obstruction on Spirometry at [facility] albuterol as needed  Lung cancer: Will perform bronchoscopy for biopsy of the RLL mass 8/30 AM  Follow-up with me in 2 to 3 weeks or sooner if needed    Current Outpatient Medications:  .  carvedilol (COREG) 12.5 MG tablet, TAKE 1 AND 1/2 TABLETS BY MOUTH TWO TIMES DAILY WITH A MEAL, Disp: 270 tablet, Rfl: 3 .  clonazePAM (KLONOPIN) 0.5 MG tablet, Take 1 tablet (0.5 mg total) by mouth 2 (two) times  daily as needed for anxiety., Disp: 10 tablet, Rfl: 0 .  Glucosamine-Chondroitin (OSTEO BI-FLEX REGULAR STRENGTH PO), Take 1 tablet by mouth daily., Disp: , Rfl:  .  IRON, FERROUS GLUCONATE, PO, Take by mouth daily., Disp: , Rfl:  .  Multiple Vitamin (MULTIVITAMIN) tablet, Take 1 tablet by mouth daily., Disp: , Rfl:  .  pravastatin (PRAVACHOL) 40 MG tablet, TAKE 1 TABLET BY MOUTH EVERY DAY, Disp: 90 tablet, Rfl: 3

## 2018-05-07 NOTE — Progress Notes (Signed)
[facility] [phone]   Fax:([phone]  OFFICE PROGRESS NOTE  [name], MD [place] [address]  DIAGNOSIS: Stage IV (T3, N0, M1C) non-small cell lung cancer, squamous cell carcinoma presented with large right infrahilar mass in addition to left upper lobe lung nodule as well as left axillary mass with left axillary lymph node diagnosed in August 2019.  PRIOR THERAPY: Palliative radiotherapy to the right infrahilar mass as well as the axillary mass under the care of Dr. Lisbeth Renshaw.  CURRENT THERAPY: None.  INTERVAL HISTORY: Travis Padilla 72 y.o. male returns to the clinic today for follow-up visit accompanied by his wife.  The patient is feeling fine today with no concerning complaints.  He has no more hemoptysis since the last time I saw him.  He started a course of palliative radiotherapy to the right infrahilar mass as well as the left axillary mass under the care of Dr. Lisbeth Renshaw.  He is tolerating his radiotherapy treatment well.  He denied having any recent weight loss or night sweats.  He has no nausea, vomiting, diarrhea or constipation.  He has no fever or chills.  He denied having any headache or visual changes.  He had several studies performed recently including a PET scan as well as ultrasound-guided core biopsy of the left axillary mass.  The final pathology was consistent with squamous cell carcinoma.  He could not tolerate MRI of the brain.  He is here today for evaluation and discussion of his treatment options.  MEDICAL HISTORY: Past Medical History:  Diagnosis Date  . Abnormal nuclear stress test    December, 2013  . Anemia   . CAD (coronary artery disease)    Mild nonobstructive plaque in cath 2013  . Chest pain    December, 2013  . Dizziness   . Gout   . Hemorrhoid   . Hyperlipidemia   . Hypertension   . Skin cancer   . Spinal stenosis     ALLERGIES:  is allergic to trazodone and nefazodone.  MEDICATIONS:    Current Outpatient Medications  Medication Sig Dispense Refill  . albuterol (PROVENTIL) (2.5 MG/3ML) 0.083% nebulizer solution Take 3 mLs (2.5 mg total) by nebulization every 6 (six) hours as needed for wheezing or shortness of breath. 75 mL 12  . carvedilol (COREG) 12.5 MG tablet TAKE 1 AND 1/2 TABLETS BY MOUTH TWO TIMES DAILY WITH A MEAL 270 tablet 3  . clonazePAM (KLONOPIN) 0.5 MG tablet Take 1 tablet (0.5 mg total) by mouth 2 (two) times daily as needed for anxiety. 10 tablet 0  . Glucosamine-Chondroitin (OSTEO BI-FLEX REGULAR STRENGTH PO) Take 1 tablet by mouth daily.    . IRON, FERROUS GLUCONATE, PO Take by mouth daily.    . Multiple Vitamin (MULTIVITAMIN) tablet Take 1 tablet by mouth daily.    . pravastatin (PRAVACHOL) 40 MG tablet TAKE 1 TABLET BY MOUTH EVERY DAY 90 tablet 3   No current facility-administered medications for this visit.     SURGICAL HISTORY:  Past Surgical History:  Procedure Laterality Date  . 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 BELPHAROPTOSIS REPAIR Bilateral   . CATARACT EXTRACTION W/ INTRAOCULAR LENS  IMPLANT, BILATERAL    . COLONOSCOPY N/A 11/03/2014   Procedure: COLONOSCOPY;  Surgeon: Rogene Houston, MD;  Location: [facility];  Service: Endoscopy;  Laterality: N/A;  1225  . KNEE CARTILAGE SURGERY Left    Left knee  .  SKIN CANCER EXCISION  12/2014, 04/25/15    REVIEW OF SYSTEMS:  Constitutional: positive for fatigue Eyes: negative Ears, nose, mouth, throat, and face: negative Respiratory: positive for cough Cardiovascular: negative Gastrointestinal: negative Genitourinary:negative Integument/breast: negative Hematologic/lymphatic: negative Musculoskeletal:negative Neurological: negative Behavioral/Psych: positive for anxiety Endocrine: negative Allergic/Immunologic: negative   PHYSICAL EXAMINATION: General appearance: alert, cooperative and no distress Head: Normocephalic, without obvious abnormality, atraumatic Neck: no adenopathy, no  JVD, supple, symmetrical, trachea midline and thyroid not enlarged, symmetric, no tenderness/mass/nodules Lymph nodes: Cervical, supraclavicular, and axillary nodes normal. Resp: clear to auscultation bilaterally Back: symmetric, no curvature. ROM normal. No CVA tenderness. Cardio: regular rate and rhythm, S1, S2 normal, no murmur, click, rub or gallop GI: soft, non-tender; bowel sounds normal; no masses,  no organomegaly Extremities: extremities normal, atraumatic, no cyanosis or edema Neurologic: Alert and oriented X 3, normal strength and tone. Normal symmetric reflexes. Normal coordination and gait  ECOG PERFORMANCE STATUS: 1 - Symptomatic but completely ambulatory  Blood pressure 128/79, pulse 68, temperature 98.7 F (37.1 C), temperature source Oral, resp. rate 18, height 6\' 1"  (1.854 m), weight 236 lb 4.8 oz (107.2 kg), SpO2 98 %.  LABORATORY DATA: Lab Results  Component Value Date   WBC 7.5 04/22/2018   HGB 12.2 (L) 04/22/2018   HCT 37.2 (L) 04/22/2018   MCV 90.3 04/22/2018   PLT 195 04/22/2018      Chemistry      Component Value Date/Time   NA 141 04/22/2018 1341   NA 144 04/09/2018 1507   K 4.6 04/22/2018 1341   CL 108 04/22/2018 1341   CO2 24 04/22/2018 1341   BUN 17 04/22/2018 1341   BUN 15 04/09/2018 1507   CREATININE 1.16 04/22/2018 1341      Component Value Date/Time   CALCIUM 9.2 04/22/2018 1341   ALKPHOS 66 04/22/2018 1341   AST 13 (L) 04/22/2018 1341   ALT 9 04/22/2018 1341   BILITOT 0.5 04/22/2018 1341       RADIOGRAPHIC STUDIES: Ct Chest High Resolution  Result Date: 04/16/2018 CLINICAL DATA:  72 year old male with history of wheezing for the past 4 months with shortness of breath. Three episodes of hemoptysis. EXAM: CT CHEST WITHOUT CONTRAST TECHNIQUE: Multidetector CT imaging of the chest was performed following the standard protocol without intravenous contrast. High resolution imaging of the lungs, as well as inspiratory and expiratory imaging,  was performed. COMPARISON:  Chest CT 08/20/2007. FINDINGS: Cardiovascular: Heart size is normal. There is no significant pericardial fluid, thickening or pericardial calcification. There is aortic atherosclerosis, as well as atherosclerosis of the great vessels of the mediastinum and the coronary arteries, including calcified atherosclerotic plaque in the left main, left anterior descending, left circumflex and right coronary arteries. Mediastinum/Nodes: Prominent soft tissue in the right hilar region contiguous with the right lower lobe mass (discussed below), at least part of which is presumably right hilar lymphadenopathy. No mediastinal or definite left hilar lymphadenopathy. Esophagus is unremarkable in appearance. In the left axillary region there is an irregular-shaped soft tissue mass with macrolobulated and spiculated margins measuring 3.5 x 3.3 x 3.3 cm (axial image 16 of series 3 and coronal image 36 of series 9). Lungs/Pleura: In the central aspect of the superior segment of the right lower lobe there is a large mass which is estimated to measure 3.7 x 7.1 x 3.6 cm (axial image 81 of series 7 and coronal image 62 of series 9). This mass comes in direct contact with the bronchus intermedius and right lower  lobe bronchi, where some of the soft tissue from the lesion appears to slightly protrude into and narrows the lumen (best appreciated on axial image 79 of series 7). Adjacent pleural thickening posteriorly to the right lower lobe mass, and some surrounding postobstructive inflammatory changes in the basal segments of the right lower lobe. In the anterior aspect of the left upper lobe there is an additional large pulmonary nodule (axial image 78 of series 7 and coronal image 27 of series 9) which measures 2.7 x 2.2 x 1.9 cm and also demonstrates macrolobulated and spiculated margins with retraction of the overlying pleura. No acute consolidative airspace disease. No left pleural effusion. High-resolution  images demonstrate no significant regions of ground-glass attenuation, subpleural reticulation, parenchymal banding, traction bronchiectasis or frank honeycombing. Inspiratory and expiratory imaging demonstrates moderate air trapping predominantly in the right lower lobe, presumably secondary to the centrally obstructing mass. Upper Abdomen: Small nonobstructive calculi measuring 2-4 mm are noted in both renal collecting systems. Aortic atherosclerosis. Musculoskeletal: There are no aggressive appearing lytic or blastic lesions noted in the visualized portions of the skeleton. IMPRESSION: 1. No evidence of interstitial lung disease. 2. 3.7 x 7.1 x 3.6 cm mass centered in the superior segment of the right lower lobe with probable right hilar lymphadenopathy and bronchial invasion, as discussed above. Findings are highly concerning for primary bronchogenic neoplasm. 3. In addition, there is a 2.7 x 2.2 x 1.9 cm suspicious appearing nodule in the left upper lobe which likely represents a synchronous primary bronchogenic neoplasm. 4. In addition, there is a malignant appearing soft tissue mass in the left axilla measuring 3.5 x 3.3 x 3.3 cm. This could represent a nodal metastasis, but this would be highly unusual from a primary bronchogenic neoplasm. The possibility of a nodal metastasis from a lesion on the left upper extremity should be considered, and correlation with cutaneous physical examination is recommended. 5. For further evaluation of findings 2-4 above further evaluation with PET-CT is recommended in the near future. 6. Aortic atherosclerosis, in addition to left main and 3 vessel coronary artery disease. Assessment for potential risk factor modification, dietary therapy or pharmacologic therapy may be warranted, if clinically indicated. 7. Nonobstructive calculi measuring 2-4 mm in the collecting systems of both kidneys. Aortic Atherosclerosis (ICD10-I70.0). Electronically Signed   By: Vinnie Langton  M.D.   On: 04/16/2018 22:13   Nm Pet Image Initial (pi) Skull Base To Thigh  Result Date: 05/05/2018 CLINICAL DATA:  Initial treatment strategy for pulmonary mass. EXAM: NUCLEAR MEDICINE PET SKULL BASE TO THIGH TECHNIQUE: 11.8 mCi F-18 FDG was injected intravenously. Full-ring PET imaging was performed from the skull base to thigh after the radiotracer. CT data was obtained and used for attenuation correction and anatomic localization. Fasting blood glucose: 93 mg/dl COMPARISON:  CT 04/16/2018 FINDINGS: Mediastinal blood pool activity: SUV max 2.4 NECK: No hypermetabolic lymph nodes in the neck. Incidental CT findings: none CHEST: Intensely hypermetabolic RIGHT infrahilar mass with SUV max equal 21. The central portion of the mass is hypermetabolic and measures approximately 3.4 by 3.4 cm. More peripherally the mass is less metabolic and likely represents postobstructive collapse. No hypermetabolic mediastinal or supraclavicular nodes. Hypermetabolic mass in the LEFT upper lobe measures 2.2 cm with SUV max equal 9.6. Intensely hypermetabolic LEFT axillary mass measures 2.9 x 2.2 cm with SUV max equal 11.6. There is additional hypermetabolic LEFT axial lymph node which is small but intensely metabolic with SUV max equal 6.3 measuring less than 10 mm. Incidental CT  findings: none ABDOMEN/PELVIS: No abnormal hypermetabolic activity within the liver, pancreas, adrenal glands, or spleen. No hypermetabolic lymph nodes in the abdomen or pelvis. Incidental CT findings: Bilateral renal calculi SKELETON: No focal hypermetabolic activity to suggest skeletal metastasis. Incidental CT findings: none IMPRESSION: 1. Hypermetabolic RIGHT infrahilar mass and hypermetabolic LEFT upper lobe pulmonary nodule. Probable RIGHT lower lobe bronchogenic carcinoma with LEFT upper lobe metastasis. Synchronous carcinoma also consider with lack of mediastinal adenopathy. 2. Hypermetabolic LEFT axillary adenopathy consistent with metastasis.  3. No hypermetabolic mediastinal adenopathy. 4. No evidence metastatic disease below the diaphragms. Electronically Signed   By: Suzy Bouchard M.D.   On: 05/05/2018 17:24    ASSESSMENT AND PLAN: This is a very pleasant 72 years old white male with very light most smoking history recently diagnosed with stage IV (T3, N0, M1c) non-small cell lung cancer, squamous cell carcinoma based on the biopsy from the left axillary mass. I personally and independently reviewed the PET scan results and discussed the result and showed the images to the patient today. I had a lengthy discussion with the patient about his treatment options. I recommended for the patient to consider repeat biopsy of the right infrahilar mass to confirm diagnosis of squamous cell carcinoma in this patient with remote and light smoking history. If the biopsy results confirms squamous cell carcinoma, we will consider The patient for treatment with carboplatin, paclitaxel and Keytruda. If the biopsy was different and consistent with adenocarcinoma, we will send the tissue block for molecular studies and PDL 1 expression. I spoke to Dr. Lake Bells and he kindly agreed to arrange for the patient to have the bronchoscopy later this week. I will arrange for the patient to have CT scan of the head with and without contrast to rule out brain metastasis because he was unable to undergo the MRI of the brain. For anxiety, I started the patient on Xanax 0.25 mg p.o. nightly as needed. I will see the patient back for follow-up visit next week for more discussion of his treatment options based on the biopsy results. He was advised to call immediately if he has any concerning symptoms in the interval. The patient voices understanding of current disease status and treatment options and is in agreement with the current care plan.  All questions were answered. The patient knows to call the clinic with any problems, questions or concerns. We can certainly  see the patient much sooner if necessary.  I spent 20 minutes counseling the patient face to face. The total time spent in the appointment was 30 minutes.  Disclaimer: This note was dictated with voice recognition software. Similar sounding words can inadvertently be transcribed and may not be corrected upon review.

## 2018-05-07 NOTE — Progress Notes (Unsigned)
Letters for travel cancellation given to pt.

## 2018-05-07 NOTE — Telephone Encounter (Signed)
Pt on his way

## 2018-05-08 ENCOUNTER — Ambulatory Visit
Admission: RE | Admit: 2018-05-08 | Discharge: 2018-05-08 | Disposition: A | Discharge status: Home / Self Care | Payer: Medicare Other | Source: Ambulatory Visit | Attending: Radiation Oncology | Admitting: Radiation Oncology

## 2018-05-08 ENCOUNTER — Telehealth: Payer: Self-pay | Admitting: Pulmonary Disease

## 2018-05-08 DIAGNOSIS — C3431 Malignant neoplasm of lower lobe, right bronchus or lung: Secondary | ICD-10-CM | POA: Diagnosis not present

## 2018-05-08 DIAGNOSIS — C7801 Secondary malignant neoplasm of right lung: Secondary | ICD-10-CM | POA: Diagnosis not present

## 2018-05-08 DIAGNOSIS — Z510 Encounter for antineoplastic radiation therapy: Secondary | ICD-10-CM | POA: Diagnosis not present

## 2018-05-08 DIAGNOSIS — C7802 Secondary malignant neoplasm of left lung: Secondary | ICD-10-CM | POA: Diagnosis not present

## 2018-05-08 DIAGNOSIS — C773 Secondary and unspecified malignant neoplasm of axilla and upper limb lymph nodes: Secondary | ICD-10-CM | POA: Diagnosis not present

## 2018-05-08 DIAGNOSIS — Z87891 Personal history of nicotine dependence: Secondary | ICD-10-CM | POA: Diagnosis not present

## 2018-05-08 NOTE — Telephone Encounter (Signed)
Spoke with Pam at [facility], Dx code of asthma given. Voiced understanding. Nothing further needed at this time.

## 2018-05-08 NOTE — Telephone Encounter (Signed)
asthma

## 2018-05-08 NOTE — Telephone Encounter (Signed)
Ok would like for me to print a letter of necessity or will you be making an addendum to the note.   BQ please advise.

## 2018-05-08 NOTE — Telephone Encounter (Signed)
Spoke with patient, patient needed to confirm time of Bronchoscopy, he also wanted to know if he would be put to sleep, it was explained to patient that there is conscious sedation and he would need someone with him to be able to drive him after the procedure. Patient voiced understanding. Nothing further needed at this time.

## 2018-05-08 NOTE — Telephone Encounter (Signed)
Sorry, on retrospect he has COPD When I look in care everywhere he had spirometry showing airflow obstruction

## 2018-05-08 NOTE — Telephone Encounter (Signed)
Spoke with Pam at [facility] [place] states the patient note needs to state that the patient has asthma and why he will benefit from a nebulizer machine in the actual note.  BQ please advise.

## 2018-05-08 NOTE — Telephone Encounter (Signed)
I addended my note

## 2018-05-08 NOTE — Telephone Encounter (Signed)
BQ's note has been faxed to [facility]. Nothing further was needed.

## 2018-05-09 ENCOUNTER — Ambulatory Visit (HOSPITAL_COMMUNITY)
Admission: RE | Admit: 2018-05-09 | Discharge: 2018-05-09 | Disposition: A | Discharge status: Other Acute Inpatient Hospital | Payer: Medicare Other | Source: Ambulatory Visit | Attending: Pulmonary Disease | Admitting: Pulmonary Disease

## 2018-05-09 ENCOUNTER — Ambulatory Visit (HOSPITAL_COMMUNITY)
Admission: RE | Admit: 2018-05-09 | Discharge: 2018-05-09 | Disposition: A | Discharge status: Home / Self Care | Payer: Medicare Other | Source: Ambulatory Visit | Attending: Internal Medicine | Admitting: Internal Medicine

## 2018-05-09 ENCOUNTER — Encounter (HOSPITAL_COMMUNITY)
Admission: RE | Disposition: A | Discharge status: Other Acute Inpatient Hospital | Payer: Self-pay | Source: Ambulatory Visit | Attending: Pulmonary Disease

## 2018-05-09 ENCOUNTER — Encounter (HOSPITAL_COMMUNITY): Payer: Self-pay

## 2018-05-09 ENCOUNTER — Ambulatory Visit (HOSPITAL_COMMUNITY): Payer: Medicare Other

## 2018-05-09 ENCOUNTER — Ambulatory Visit: Payer: Medicare Other | Admitting: Internal Medicine

## 2018-05-09 ENCOUNTER — Ambulatory Visit
Admission: RE | Admit: 2018-05-09 | Discharge: 2018-05-09 | Disposition: A | Discharge status: Home / Self Care | Payer: Medicare Other | Source: Ambulatory Visit | Attending: Radiation Oncology | Admitting: Radiation Oncology

## 2018-05-09 DIAGNOSIS — R51 Headache: Secondary | ICD-10-CM

## 2018-05-09 DIAGNOSIS — E785 Hyperlipidemia, unspecified: Secondary | ICD-10-CM | POA: Insufficient documentation

## 2018-05-09 DIAGNOSIS — Z8249 Family history of ischemic heart disease and other diseases of the circulatory system: Secondary | ICD-10-CM | POA: Diagnosis not present

## 2018-05-09 DIAGNOSIS — R918 Other nonspecific abnormal finding of lung field: Secondary | ICD-10-CM | POA: Diagnosis not present

## 2018-05-09 DIAGNOSIS — C773 Secondary and unspecified malignant neoplasm of axilla and upper limb lymph nodes: Secondary | ICD-10-CM | POA: Diagnosis not present

## 2018-05-09 DIAGNOSIS — C969 Malignant neoplasm of lymphoid, hematopoietic and related tissue, unspecified: Secondary | ICD-10-CM | POA: Diagnosis not present

## 2018-05-09 DIAGNOSIS — I2510 Atherosclerotic heart disease of native coronary artery without angina pectoris: Secondary | ICD-10-CM | POA: Insufficient documentation

## 2018-05-09 DIAGNOSIS — Z510 Encounter for antineoplastic radiation therapy: Secondary | ICD-10-CM | POA: Diagnosis not present

## 2018-05-09 DIAGNOSIS — C7802 Secondary malignant neoplasm of left lung: Secondary | ICD-10-CM | POA: Diagnosis not present

## 2018-05-09 DIAGNOSIS — C3431 Malignant neoplasm of lower lobe, right bronchus or lung: Secondary | ICD-10-CM | POA: Diagnosis not present

## 2018-05-09 DIAGNOSIS — C3490 Malignant neoplasm of unspecified part of unspecified bronchus or lung: Secondary | ICD-10-CM

## 2018-05-09 DIAGNOSIS — Z87891 Personal history of nicotine dependence: Secondary | ICD-10-CM | POA: Insufficient documentation

## 2018-05-09 DIAGNOSIS — Z888 Allergy status to other drugs, medicaments and biological substances status: Secondary | ICD-10-CM | POA: Diagnosis not present

## 2018-05-09 DIAGNOSIS — Z85828 Personal history of other malignant neoplasm of skin: Secondary | ICD-10-CM | POA: Diagnosis not present

## 2018-05-09 DIAGNOSIS — I10 Essential (primary) hypertension: Secondary | ICD-10-CM | POA: Diagnosis not present

## 2018-05-09 DIAGNOSIS — R519 Headache, unspecified: Secondary | ICD-10-CM

## 2018-05-09 DIAGNOSIS — C7801 Secondary malignant neoplasm of right lung: Secondary | ICD-10-CM | POA: Diagnosis not present

## 2018-05-09 DIAGNOSIS — C801 Malignant (primary) neoplasm, unspecified: Secondary | ICD-10-CM | POA: Diagnosis not present

## 2018-05-09 DIAGNOSIS — Z9889 Other specified postprocedural states: Secondary | ICD-10-CM

## 2018-05-09 DIAGNOSIS — C184 Malignant neoplasm of transverse colon: Secondary | ICD-10-CM | POA: Diagnosis not present

## 2018-05-09 HISTORY — PX: VIDEO BRONCHOSCOPY: SHX5072

## 2018-05-09 SURGERY — BRONCHOSCOPY, WITH FLUOROSCOPY
Anesthesia: Moderate Sedation | Laterality: Bilateral

## 2018-05-09 MED ORDER — IOHEXOL 300 MG/ML  SOLN
75.0000 mL | Freq: Once | INTRAMUSCULAR | Status: AC | PRN
  Administered 2018-05-09: 75 mL via INTRAVENOUS

## 2018-05-09 MED ORDER — PHENYLEPHRINE HCL 0.25 % NA SOLN: 1.0000 | Freq: Four times a day (QID) | NASAL | Status: DC | PRN

## 2018-05-09 MED ORDER — FENTANYL CITRATE (PF) 100 MCG/2ML IJ SOLN
INTRAMUSCULAR | Status: AC
  Filled 2018-05-09: qty 4

## 2018-05-09 MED ORDER — MIDAZOLAM HCL 5 MG/ML IJ SOLN
INTRAMUSCULAR | Status: AC
  Filled 2018-05-09: qty 2

## 2018-05-09 MED ORDER — LIDOCAINE HCL 1 % IJ SOLN
INTRAMUSCULAR | Status: DC | PRN
  Administered 2018-05-09: 6 mL via RESPIRATORY_TRACT

## 2018-05-09 MED ORDER — MIDAZOLAM HCL 10 MG/2ML IJ SOLN
INTRAMUSCULAR | Status: DC | PRN
  Administered 2018-05-09: 2 mg via INTRAVENOUS
  Administered 2018-05-09 (×3): 1 mg via INTRAVENOUS

## 2018-05-09 MED ORDER — PHENYLEPHRINE HCL 0.25 % NA SOLN
NASAL | Status: DC | PRN
  Administered 2018-05-09: 2 via NASAL

## 2018-05-09 MED ORDER — BUTAMBEN-TETRACAINE-BENZOCAINE 2-2-14 % EX AERO: 1.0000 | INHALATION_SPRAY | Freq: Once | CUTANEOUS | Status: DC

## 2018-05-09 MED ORDER — LIDOCAINE HCL URETHRAL/MUCOSAL 2 % EX GEL
CUTANEOUS | Status: DC | PRN
  Administered 2018-05-09: 1

## 2018-05-09 MED ORDER — SODIUM CHLORIDE 0.9 % IV SOLN
INTRAVENOUS | Status: DC
  Administered 2018-05-09: 09:00:00 via INTRAVENOUS

## 2018-05-09 MED ORDER — LIDOCAINE HCL 2 % EX GEL
1.0000 "application " | Freq: Once | CUTANEOUS | Status: DC
  Filled 2018-05-09: qty 5

## 2018-05-09 MED ORDER — FENTANYL CITRATE (PF) 100 MCG/2ML IJ SOLN
INTRAMUSCULAR | Status: DC | PRN
  Administered 2018-05-09 (×3): 25 ug via INTRAVENOUS
  Administered 2018-05-09: 50 ug via INTRAVENOUS

## 2018-05-09 NOTE — H&P (Signed)
[facility]   HPI: 72 y/o with minimal smoking history found to have two lung masses.   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  He is here today to have the RLL mass biopsied to rule out a second primary and test for genetic mutations.  Past Medical History:  Diagnosis Date  . Abnormal nuclear stress test    December, 2013  . Anemia   . CAD (coronary artery disease)    Mild nonobstructive plaque in cath 2013  . Chest pain    December, 2013  . Dizziness   . Gout   . Hemorrhoid   . Hyperlipidemia   . Hypertension   . Skin cancer   . Spinal stenosis      Family History  Problem Relation Age of Onset  . Heart attack Father   . Heart failure Mother   . Parkinson's disease Mother   . Healthy Sister   . Skin cancer Sister   . Neuropathy Brother   . COPD Brother   . Epilepsy Brother   . Healthy Sister   . Healthy Sister   . Colon cancer Neg Hx      Social History   Socioeconomic History  . Marital status: Married    Spouse name: Not on file  . Number of children: 1  . Years of education: Not on file  . Highest education level: Not on file  Occupational History  . Occupation: Government social research officer  Social Needs  . Financial resource strain: Not on file  . Food insecurity:    Worry: Not on file    Inability: Not on file  . Transportation needs:    Medical: Not on file    Non-medical: Not on file  Tobacco Use  . Smoking status: Former Smoker    Packs/day: 0.20    Years: 2.00    Pack years: 0.40    Types: Cigarettes    Start date: 10/01/1968  . Smokeless tobacco: Never Used  Substance and Sexual Activity  . Alcohol use: No    Comment: hx of   . Drug use: No  . Sexual activity: Not on file  Lifestyle  . Physical activity:    Days per week: Not on file    Minutes per session: Not on file  . Stress: Not on file  Relationships  . Social connections:    Talks on phone: Not on file    Gets together: Not on file    Attends  religious service: Not on file    Active member of club or organization: Not on file    Attends meetings of clubs or organizations: Not on file    Relationship status: Not on file  . Intimate partner violence:    Fear of current or ex partner: Not on file    Emotionally abused: Not on file    Physically abused: Not on file    Forced sexual activity: Not on file  Other Topics Concern  . Not on file  Social History Narrative   Unable to ask intimate partner violence questions, wife present     Allergies  Allergen Reactions  . Trazodone And Nefazodone Palpitations     @encmedstart @  Vitals:   05/09/18 0815  Temp: (P) 98.5 F (36.9 C)  TempSrc: (P) Oral   Gen: well appearing HENT: OP clear, TM's clear, neck supple PULM: CTA B, normal percussion CV: RRR, no mgr, trace edema GI: BS+, soft, nontender Derm: no  cyanosis or rash Psyche: normal mood and affect   CBC    Component Value Date/Time   WBC 7.5 04/22/2018 1341   WBC 6.0 08/21/2012 1514   RBC 4.12 (L) 04/22/2018 1341   HGB 12.2 (L) 04/22/2018 1341   HGB 13.0 12/04/2017 1202   HCT 37.2 (L) 04/22/2018 1341   HCT 39.6 12/04/2017 1202   PLT 195 04/22/2018 1341   PLT 218 12/04/2017 1202   MCV 90.3 04/22/2018 1341   MCV 92 12/04/2017 1202   MCH 29.6 04/22/2018 1341   MCHC 32.8 04/22/2018 1341   RDW 12.9 04/22/2018 1341   RDW 13.8 12/04/2017 1202   LYMPHSABS 2.0 04/22/2018 1341   LYMPHSABS 1.8 12/04/2017 1202   MONOABS 0.9 04/22/2018 1341   EOSABS 0.2 04/22/2018 1341   EOSABS 0.2 12/04/2017 1202   BASOSABS 0.0 04/22/2018 1341   BASOSABS 0.0 12/04/2017 1202   CT Chest: LUL mass 2.2 cm, large RLL mass  Impression/plan:  Multiple lung masses, 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  He understands the risks and benefits and is willing to proceed.  Roselie Awkward, MD [facility] Pager: [phone] Cell:  [phone] After 3pm or if no response, call [phone]

## 2018-05-09 NOTE — Progress Notes (Signed)
Video Bronchoscopy done Intervention Bronchial Washing done Intervention Bronchial biopsy done Intervention Bronchial brushing done Procedure tolerated well

## 2018-05-09 NOTE — Discharge Instructions (Signed)
Flexible Bronchoscopy, Care After These instructions give you information on caring for yourself after your procedure. Your doctor may also give you more specific instructions. Call your doctor if you have any problems or questions after your procedure. Follow these instructions at home:  Do not eat or drink anything for 2 hours after your procedure. If you try to eat or drink before the medicine wears off, food or drink could go into your lungs. You could also burn yourself.  After 2 hours have passed and when you can cough and gag normally, you may eat soft food and drink liquids slowly.  The day after the test, you may eat your normal diet.  You may do your normal activities.  Keep all doctor visits. Get help right away if:  You get more and more short of breath.  You get light-headed.  You feel like you are going to pass out (faint).  You have chest pain.  You have new problems that worry you.  You cough up more than a little blood.  You cough up more blood than before. This information is not intended to replace advice given to you by your health care provider. Make sure you discuss any questions you have with your health care provider. Document Released: 06/24/2009 Document Revised: 02/02/2016 Document Reviewed: 05/01/2013 Elsevier Interactive Patient Education  2017 [employer].  Nothing to eat or drink until  11:30  am today 05/09/2018 Any Questions or concerns please call the office at [phone]

## 2018-05-09 NOTE — Op Note (Signed)
[facility] Cardiopulmonary Patient Name: Travis Padilla Procedure Date: 05/09/2018 MRN: [id number] Attending MD: Juanito Doom , MD Date of Birth: [birth year] CSN: [id number] Age: 72 Admit Type: Outpatient Ethnicity: Not Hispanic or Latino Procedure:            Bronchoscopy Indications:          Right lower lobe mass Providers:            Nathaneil Canary B. Mcquaid, MD, Andre Lefort RRT,RCP, Alice                        "Alex" Idelle Jo RRT, RCP Referring MD:          Medicines:            Midazolam 5 mg IV, Fentanyl 211 mcg IV Complications:        No immediate complications Estimated Blood Loss: Estimated blood loss was minimal. Procedure:      Pre-Anesthesia Assessment:      - A History and Physical has been performed. Patient meds and allergies       have been reviewed. The risks and benefits of the procedure and the       sedation options and risks were discussed with the patient. All       questions were answered and informed consent was obtained. Patient       identification and proposed procedure were verified prior to the       procedure by the physician and the technician in the procedure room.       Mental Status Examination: normal. Airway Examination: normal       oropharyngeal airway. Respiratory Examination: clear to auscultation. CV       Examination: normal. ASA Grade Assessment: II - A patient with mild       systemic disease. After reviewing the risks and benefits, the patient       was deemed in satisfactory condition to undergo the procedure. The       anesthesia plan was to use moderate sedation / analgesia (conscious       sedation). Immediately prior to administration of medications, the       patient was re-assessed for adequacy to receive sedatives. The heart       rate, respiratory rate, oxygen saturations, blood pressure, adequacy of       pulmonary ventilation, and response to care were monitored throughout       the procedure. The physical  status of the patient was re-assessed after       the procedure.      After obtaining informed consent, the bronchoscope was passed under       direct vision. Throughout the procedure, the patient's blood pressure,       pulse, and oxygen saturations were monitored continuously. the BF-H190       ([id number]) Olympus Bronchoscope was introduced through the right nostril       and advanced to the tracheobronchial tree. The procedure was       accomplished without difficulty. The patient tolerated the procedure       well. The total duration of the procedure was 18 minutes. Findings:      The nasopharynx/oropharynx appears normal. The larynx appears normal.       The vocal cords appear normal. The subglottic space is normal. The       trachea is of normal caliber. The carina is sharp. The tracheobronchial  tree of the left lung was examined to at least the first subsegmental       level. Bronchial mucosa and anatomy in the left lung are normal; there       are no endobronchial lesions, and no secretions.      Bilateral Lung Abnormalities: A partially obstructing mass was found in       the middle portion in the bronchus intermedius. The mass was large and       endobronchial and friable. The lesion was successfully traversed.       Endobronchial biopsies were performed in the bronchus intermedius using       forceps and sent for histopathology examination. Four samples were       obtained. Transbronchial biopsies were performed in the right lower lobe       using forceps and sent for histopathology examination. The procedure was       guided by fluoroscopy. Transbronchial biopsy technique was selected       because the sampling site was not visible endoscopically. Five biopsy       passes were performed. Five biopsy samples were obtained. Estimated       blood loss: minimal. Fluoroscopy guided transbronchial brushings were       obtained in the right lower lobe with a cytology brush and  sent for       routine cytology. Two samples were obtained. BAL was performed in the       right lower lobe of the lung and sent for routine cytology. 60 mL of       fluid were instilled. 20 mL were returned. The return was bloody. There       were no mucoid plugs in the return fluid. Impression:      - Right lower lobe mass      - The airway examination of the left lung was normal.      - An endobronchial and friable mass was found in the bronchus       intermedius. This lesion is malignant.      - An endobronchial biopsy was performed.      - Transbronchial lung biopsies were performed.      - Transbronchial brushings were obtained.      - Bronchoalveolar lavage was performed. Moderate Sedation:      Moderate (conscious) sedation was personally administered by the       pulmonologist. The following parameters were monitored: oxygen       saturation, heart rate, blood pressure, respiratory rate, EKG, adequacy       of pulmonary ventilation, and response to care. Total physician       intraservice time was 25 minutes. Recommendation:      - Await BAL, biopsy and brushing results. Procedure Code(s):      --- Professional ---      [id number], Bronchoscopy, rigid or flexible, including fluoroscopic guidance,       when performed; with transbronchial lung biopsy(s), single lobe      67893, Bronchoscopy, rigid or flexible, including fluoroscopic guidance,       when performed; with bronchial alveolar lavage      [phone], Bronchoscopy, rigid or flexible, including fluoroscopic guidance,       when performed; with brushing or protected brushings      99152, Moderate sedation services provided by the same physician or       other qualified health care professional performing the diagnostic or  therapeutic service that the sedation supports, requiring the presence       of an independent trained observer to assist in the monitoring of the       patient's level of consciousness and  physiological status; initial 15       minutes of intraservice time, patient age 81 years or older      [phone], Moderate sedation services provided by the same physician or       other qualified health care professional performing the diagnostic or       therapeutic service that the sedation supports, requiring the presence       of an independent trained observer to assist in the monitoring of the       patient's level of consciousness and physiological status; each       additional 15 minutes intraservice time (List separately in addition to       code for primary service) Diagnosis Code(s):      --- Professional ---      R91.8, Other nonspecific abnormal finding of lung field      C34.00, Malignant neoplasm of unspecified main bronchus CPT copyright 2017 American Medical Association. All rights reserved. The codes documented in this report are preliminary and upon coder review may  be revised to meet current compliance requirements. Norlene Campbell, MD Juanito Doom, MD 05/09/2018 9:26:36 AM This report has been signed electronically. Number of Addenda: 0 Scope In: 8:58:16 AM Scope Out: 9:15:25 AM

## 2018-05-10 DIAGNOSIS — M25562 Pain in left knee: Secondary | ICD-10-CM | POA: Diagnosis not present

## 2018-05-13 ENCOUNTER — Inpatient Hospital Stay: Payer: Medicare Other | Attending: Oncology | Admitting: Oncology

## 2018-05-13 ENCOUNTER — Encounter (HOSPITAL_COMMUNITY): Payer: Self-pay | Admitting: Pulmonary Disease

## 2018-05-13 ENCOUNTER — Ambulatory Visit
Admission: RE | Admit: 2018-05-13 | Discharge: 2018-05-13 | Disposition: A | Discharge status: Home / Self Care | Payer: Medicare Other | Source: Ambulatory Visit | Attending: Radiation Oncology | Admitting: Radiation Oncology

## 2018-05-13 ENCOUNTER — Other Ambulatory Visit: Payer: Self-pay | Admitting: Internal Medicine

## 2018-05-13 ENCOUNTER — Other Ambulatory Visit: Payer: Self-pay

## 2018-05-13 ENCOUNTER — Telehealth: Payer: Self-pay

## 2018-05-13 VITALS — BP 124/79 | HR 71 | Temp 98.0°F | Resp 16 | Ht 73.0 in | Wt 236.2 lb

## 2018-05-13 DIAGNOSIS — C7802 Secondary malignant neoplasm of left lung: Secondary | ICD-10-CM | POA: Diagnosis not present

## 2018-05-13 DIAGNOSIS — Z5111 Encounter for antineoplastic chemotherapy: Secondary | ICD-10-CM | POA: Insufficient documentation

## 2018-05-13 DIAGNOSIS — C3491 Malignant neoplasm of unspecified part of right bronchus or lung: Secondary | ICD-10-CM | POA: Diagnosis not present

## 2018-05-13 DIAGNOSIS — R918 Other nonspecific abnormal finding of lung field: Secondary | ICD-10-CM | POA: Diagnosis not present

## 2018-05-13 DIAGNOSIS — R5383 Other fatigue: Secondary | ICD-10-CM

## 2018-05-13 DIAGNOSIS — Z87891 Personal history of nicotine dependence: Secondary | ICD-10-CM

## 2018-05-13 DIAGNOSIS — Z5112 Encounter for antineoplastic immunotherapy: Secondary | ICD-10-CM | POA: Diagnosis not present

## 2018-05-13 DIAGNOSIS — R238 Other skin changes: Secondary | ICD-10-CM | POA: Diagnosis not present

## 2018-05-13 DIAGNOSIS — C3431 Malignant neoplasm of lower lobe, right bronchus or lung: Secondary | ICD-10-CM | POA: Diagnosis not present

## 2018-05-13 DIAGNOSIS — Z510 Encounter for antineoplastic radiation therapy: Secondary | ICD-10-CM | POA: Diagnosis not present

## 2018-05-13 DIAGNOSIS — G620 Drug-induced polyneuropathy: Secondary | ICD-10-CM | POA: Diagnosis not present

## 2018-05-13 DIAGNOSIS — T451X5A Adverse effect of antineoplastic and immunosuppressive drugs, initial encounter: Secondary | ICD-10-CM | POA: Insufficient documentation

## 2018-05-13 DIAGNOSIS — C773 Secondary and unspecified malignant neoplasm of axilla and upper limb lymph nodes: Secondary | ICD-10-CM | POA: Insufficient documentation

## 2018-05-13 DIAGNOSIS — C7801 Secondary malignant neoplasm of right lung: Secondary | ICD-10-CM | POA: Diagnosis not present

## 2018-05-13 DIAGNOSIS — Z7189 Other specified counseling: Secondary | ICD-10-CM | POA: Insufficient documentation

## 2018-05-13 DIAGNOSIS — Z7902 Long term (current) use of antithrombotics/antiplatelets: Secondary | ICD-10-CM | POA: Insufficient documentation

## 2018-05-13 DIAGNOSIS — Z5189 Encounter for other specified aftercare: Secondary | ICD-10-CM | POA: Diagnosis not present

## 2018-05-13 DIAGNOSIS — M10062 Idiopathic gout, left knee: Secondary | ICD-10-CM | POA: Insufficient documentation

## 2018-05-13 DIAGNOSIS — Z923 Personal history of irradiation: Secondary | ICD-10-CM | POA: Diagnosis not present

## 2018-05-13 HISTORY — DX: Encounter for antineoplastic immunotherapy: Z51.12

## 2018-05-13 HISTORY — DX: Malignant neoplasm of unspecified part of right bronchus or lung: C34.91

## 2018-05-13 MED ORDER — PROCHLORPERAZINE MALEATE 10 MG PO TABS: 10.0000 mg | ORAL_TABLET | Freq: Four times a day (QID) | ORAL | 1 refills | Status: DC | PRN

## 2018-05-13 NOTE — Assessment & Plan Note (Signed)
This is a very pleasant 72 year old white male with very light most smoking history recently diagnosed with stage IV (T3, N0, M1c) non-small cell lung cancer, squamous cell carcinoma based on the biopsy from the left axillary mass. The patient had a recent bronchoscopy and pathology report is pending. He had a CT scan of the head performed recently and is here to discuss the results.  The patient was seen with Dr. Julien Nordmann.  We discussed the CT of the head results which did not show any evidence of brain metastasis.  We discussed with the patient and his wife that his biopsy results are still pending and that upon initial discussion with the pathologist this morning look consistent with his prior biopsy.  However, a final report will be back later today or tomorrow. We discussed treatment for his squamous cell carcinoma and recommended proceeding with systemic treatment consisting of carboplatin for an AUC of 5, paclitaxel 175 mg/m, and Keytruda 200 mg IV every 3 weeks. Discussed with the patient adverse effects of this treatment including but not limited to alopecia, myelosuppression, nausea and vomiting, peripheral neuropathy, liver or renal dysfunction in addition to the ddverse effects of immunotherapy including but not limited to immune mediated the skin rash, diarrhea, inflammation of the lung, kidney, liver, thyroid or other endocrine dysfunction. The patient is agreeable to proceed.  We will plan to call the patient only if his pending biopsy is not what we are expecting. The patient will be set up for chemotherapy education class.  He was given a prescription for Compazine 10 mg every 6 hours as needed for nausea and vomiting.  The patient will have weekly labs while on treatment.  Anticipate first cycle of chemotherapy to be given on 05/20/2018. The patient will follow-up for a return visit in approximately 4 weeks for evaluation prior to cycle #2 of his treatment.  For his gout, he will follow-up  with his primary care provider.  We discussed that he may use the colchicine now prior to starting chemotherapy.  We discussed that this medication may drop his blood counts.  He was advised to call immediately if he has any concerning symptoms in the interval. The patient voices understanding of current disease status and treatment options and is in agreement with the current care plan.  All questions were answered. The patient knows to call the clinic with any problems, questions or concerns. We can certainly see the patient much sooner if necessary.

## 2018-05-13 NOTE — Progress Notes (Signed)
[facility] OFFICE PROGRESS NOTE  Travis Maize, MD [place] [address]  DIAGNOSIS: Stage IV (T3, N0, M1C) non-small cell lung cancer, squamous cell carcinoma presented with large right infrahilar mass in addition to left upper lobe lung nodule as well as left axillary mass with left axillary lymph node diagnosed in August 2019.  PRIOR THERAPY: Palliative radiotherapy to the right infrahilar mass as well as the axillary mass under the care of Dr. Lisbeth Renshaw.  CURRENT THERAPY:  Carboplatin for an AUC of 5, paclitaxel 175 mg/m, and Keytruda 200 mg IV given every 3 weeks.  First dose expected on 05/20/2018.  INTERVAL HISTORY: Travis Padilla 72 y.o. male returns for routine follow-up visit accompanied by his wife.  The patient is feeling fine today and has no specific complaints.  He reports that his shortness of breath has improved since starting radiation.  He does note some difficulty swallowing times.  He denies fevers and chills.  Denies chest pain, cough, hemoptysis.  Denies nausea, vomiting, constipation, diarrhea.  He reports that he had left knee pain and swelling over the weekend and was seen at urgent care.  He was diagnosed with gout and started on colchicine.  The patient had a bronchoscopy performed at the end of last week and CT of the head and is here to discuss the results.  MEDICAL HISTORY: Past Medical History:  Diagnosis Date  . Abnormal nuclear stress test    December, 2013  . Anemia   . CAD (coronary artery disease)    Mild nonobstructive plaque in cath 2013  . Chest pain    December, 2013  . Dizziness   . Gout   . Hemorrhoid   . Hyperlipidemia   . Hypertension   . Skin cancer   . Spinal stenosis     ALLERGIES:  is allergic to trazodone and nefazodone.  MEDICATIONS:  Current Outpatient Medications  Medication Sig Dispense Refill  . ALPRAZolam (XANAX) 0.25 MG tablet Take 1 tablet (0.25 mg total) by mouth at bedtime as needed for  anxiety. 30 tablet 0  . Artificial Tear Solution (SOOTHE XP OP) Place 1 drop into both eyes 2 (two) times daily.    . carvedilol (COREG) 12.5 MG tablet TAKE 1 AND 1/2 TABLETS BY MOUTH TWO TIMES DAILY WITH A MEAL (Patient taking differently: Take 18.75 mg by mouth 2 (two) times daily with a meal. ) 270 tablet 3  . colchicine 0.6 MG tablet Take 2 tablets by mouth 2 (two) times daily.    Marland Kitchen diltiazem (CARDIZEM) 120 MG tablet Take 120 mg by mouth daily.    . ferrous sulfate 325 (65 FE) MG tablet Take 325 mg by mouth daily with breakfast.    . Glucosamine-Chondroitin (OSTEO BI-FLEX REGULAR STRENGTH PO) Take 1 tablet by mouth daily.    . Multiple Vitamin (MULTIVITAMIN) tablet Take 1 tablet by mouth daily.    . pravastatin (PRAVACHOL) 40 MG tablet TAKE 1 TABLET BY MOUTH EVERY DAY (Patient taking differently: Take 40 mg by mouth at bedtime. ) 90 tablet 3  . Wound Dressings (SONAFINE EX) Apply 1 application topically daily.    Marland Kitchen albuterol (PROVENTIL) (2.5 MG/3ML) 0.083% nebulizer solution Take 3 mLs (2.5 mg total) by nebulization every 6 (six) hours as needed for wheezing or shortness of breath. (Patient not taking: Reported on 05/13/2018) 75 mL 12  . clonazePAM (KLONOPIN) 0.5 MG tablet Take 1 tablet (0.5 mg total) by mouth 2 (two) times daily as  needed for anxiety. (Patient not taking: Reported on 05/08/2018) 10 tablet 0  . Ibuprofen-diphenhydrAMINE HCl (ADVIL PM) 200-25 MG CAPS Take 1 capsule by mouth at bedtime as needed (for sleep).    . prochlorperazine (COMPAZINE) 10 MG tablet Take 1 tablet (10 mg total) by mouth every 6 (six) hours as needed for nausea or vomiting. 30 tablet 1   No current facility-administered medications for this visit.     SURGICAL HISTORY:  Past Surgical History:  Procedure Laterality Date  . 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 BELPHAROPTOSIS REPAIR Bilateral   . CATARACT EXTRACTION W/ INTRAOCULAR LENS  IMPLANT, BILATERAL    . COLONOSCOPY N/A 11/03/2014   Procedure:  COLONOSCOPY;  Surgeon: Rogene Houston, MD;  Location: [facility];  Service: Endoscopy;  Laterality: N/A;  1225  . KNEE CARTILAGE SURGERY Left    Left knee  . SKIN CANCER EXCISION  12/2014, 04/25/15  . VIDEO BRONCHOSCOPY Bilateral 05/09/2018   Procedure: VIDEO BRONCHOSCOPY WITH FLUORO;  Surgeon: Juanito Doom, MD;  Location: [facility];  Service: Cardiopulmonary;  Laterality: Bilateral;    REVIEW OF SYSTEMS:   Review of Systems  Constitutional: Negative for appetite change, chills, fatigue, fever and unexpected weight change.  HENT:   Negative for mouth sores, nosebleeds, sore throat and trouble swallowing.   Eyes: Negative for eye problems and icterus.  Respiratory: Negative for cough, hemoptysis, shortness of breath and wheezing.   Cardiovascular: Negative for chest pain and leg swelling.  Gastrointestinal: Negative for abdominal pain, constipation, diarrhea, nausea and vomiting.  Genitourinary: Negative for bladder incontinence, difficulty urinating, dysuria, frequency and hematuria.   Musculoskeletal: Negative for back pain, gait problem, neck pain and neck stiffness.  Positive for left knee pain secondary to gout which has improved with treatment. Skin: Negative for itching and rash.  Neurological: Negative for dizziness, extremity weakness, gait problem, headaches, light-headedness and seizures.  Hematological: Negative for adenopathy. Does not bruise/bleed easily.  Psychiatric/Behavioral: Negative for confusion, depression and sleep disturbance. The patient is not nervous/anxious.     PHYSICAL EXAMINATION:  Blood pressure 124/79, pulse 71, temperature 98 F (36.7 C), temperature source Oral, resp. rate 16, height 6\' 1"  (1.854 m), weight 236 lb 3.2 oz (107.1 kg), SpO2 99 %.  ECOG PERFORMANCE STATUS: 1 - Symptomatic but completely ambulatory  Physical Exam  Constitutional: Oriented to person, place, and time and well-developed, well-nourished, and in no distress. No  distress.  HENT:  Head: Normocephalic and atraumatic.  Mouth/Throat: Oropharynx is clear and moist. No oropharyngeal exudate.  Eyes: Conjunctivae are normal. Right eye exhibits no discharge. Left eye exhibits no discharge. No scleral icterus.  Neck: Normal range of motion. Neck supple.  Cardiovascular: Normal rate, regular rhythm, normal heart sounds and intact distal pulses.   Pulmonary/Chest: Effort normal and breath sounds normal. No respiratory distress. No wheezes. No rales.  Abdominal: Soft. Bowel sounds are normal. Exhibits no distension and no mass. There is no tenderness.  Musculoskeletal: Normal range of motion. Exhibits no edema.  Lymphadenopathy:    No cervical adenopathy.  Neurological: Alert and oriented to person, place, and time. Exhibits normal muscle tone. Gait normal. Coordination normal.  Skin: Skin is warm and dry. No rash noted. Not diaphoretic. No erythema. No pallor.  Psychiatric: Mood, memory and judgment normal.  Vitals reviewed.  LABORATORY DATA: Lab Results  Component Value Date   WBC 7.5 04/22/2018   HGB 12.2 (L) 04/22/2018   HCT 37.2 (L) 04/22/2018   MCV 90.3  04/22/2018   PLT 195 04/22/2018      Chemistry      Component Value Date/Time   NA 141 04/22/2018 1341   NA 144 04/09/2018 1507   K 4.6 04/22/2018 1341   CL 108 04/22/2018 1341   CO2 24 04/22/2018 1341   BUN 17 04/22/2018 1341   BUN 15 04/09/2018 1507   CREATININE 1.16 04/22/2018 1341      Component Value Date/Time   CALCIUM 9.2 04/22/2018 1341   ALKPHOS 66 04/22/2018 1341   AST 13 (L) 04/22/2018 1341   ALT 9 04/22/2018 1341   BILITOT 0.5 04/22/2018 1341       RADIOGRAPHIC STUDIES:  Ct Head W Wo Contrast  Result Date: 05/09/2018 CLINICAL DATA:  Lung cancer staging. Headaches. EXAM: CT HEAD WITHOUT AND WITH CONTRAST TECHNIQUE: Contiguous axial images were obtained from the base of the skull through the vertex without and with intravenous contrast CONTRAST:  26mL OMNIPAQUE IOHEXOL  300 MG/ML  SOLN COMPARISON:  None. FINDINGS: Brain: There is no evidence of acute infarct, intracranial hemorrhage, mass, midline shift, or extra-axial fluid collection. There is mild-to-moderate lateral ventriculomegaly without significant temporal horn dilatation. The third ventricle is mildly enlarged. The sulci are normal in size. Minimal periventricular white matter hypoattenuation is within normal limits for age. 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 No abnormal enhancement is identified. Vascular: Calcified atherosclerosis at the skull base. Grossly patent major dural venous sinuses. Skull: No fracture or focal osseous lesion. Sinuses/Orbits: Visualized paranasal sinuses and mastoid air cells are clear. Bilateral cataract extraction. Other: None. IMPRESSION: 1. No evidence of intracranial metastases or acute abnormality. 2. Mild-to-moderate lateral ventriculomegaly without significant temporal horn dilatation. This is favored to reflect central predominant cerebral atrophy with normal pressure hydrocephalus considered less likely. Electronically Signed   By: Logan Bores M.D.   On: 05/09/2018 11:08   Ct Chest High Resolution  Result Date: 04/16/2018 CLINICAL DATA:  72 year old male with history of wheezing for the past 4 months with shortness of breath. Three episodes of hemoptysis. EXAM: CT CHEST WITHOUT CONTRAST TECHNIQUE: Multidetector CT imaging of the chest was performed following the standard protocol without intravenous contrast. High resolution imaging of the lungs, as well as inspiratory and expiratory imaging, was performed. COMPARISON:  Chest CT 08/20/2007. FINDINGS: Cardiovascular: Heart size is normal. There is no significant pericardial fluid, thickening or pericardial calcification. There is aortic atherosclerosis, as well as atherosclerosis of the great vessels of the mediastinum and the coronary arteries, including  calcified atherosclerotic plaque in the left main, left anterior descending, left circumflex and right coronary arteries. Mediastinum/Nodes: Prominent soft tissue in the right hilar region contiguous with the right lower lobe mass (discussed below), at least part of which is presumably right hilar lymphadenopathy. No mediastinal or definite left hilar lymphadenopathy. Esophagus is unremarkable in appearance. In the left axillary region there is an irregular-shaped soft tissue mass with macrolobulated and spiculated margins measuring 3.5 x 3.3 x 3.3 cm (axial image 16 of series 3 and coronal image 36 of series 9). Lungs/Pleura: In the central aspect of the superior segment of the right lower lobe there is a large mass which is estimated to measure 3.7 x 7.1 x 3.6 cm (axial image 81 of series 7 and coronal image 62 of series 9). This mass comes in direct contact with the bronchus intermedius and right lower lobe bronchi, where some of the soft tissue from the lesion  appears to slightly protrude into and narrows the lumen (best appreciated on axial image 79 of series 7). Adjacent pleural thickening posteriorly to the right lower lobe mass, and some surrounding postobstructive inflammatory changes in the basal segments of the right lower lobe. In the anterior aspect of the left upper lobe there is an additional large pulmonary nodule (axial image 78 of series 7 and coronal image 27 of series 9) which measures 2.7 x 2.2 x 1.9 cm and also demonstrates macrolobulated and spiculated margins with retraction of the overlying pleura. No acute consolidative airspace disease. No left pleural effusion. High-resolution images demonstrate no significant regions of ground-glass attenuation, subpleural reticulation, parenchymal banding, traction bronchiectasis or frank honeycombing. Inspiratory and expiratory imaging demonstrates moderate air trapping predominantly in the right lower lobe, presumably secondary to the centrally  obstructing mass. Upper Abdomen: Small nonobstructive calculi measuring 2-4 mm are noted in both renal collecting systems. Aortic atherosclerosis. Musculoskeletal: There are no aggressive appearing lytic or blastic lesions noted in the visualized portions of the skeleton. IMPRESSION: 1. No evidence of interstitial lung disease. 2. 3.7 x 7.1 x 3.6 cm mass centered in the superior segment of the right lower lobe with probable right hilar lymphadenopathy and bronchial invasion, as discussed above. Findings are highly concerning for primary bronchogenic neoplasm. 3. In addition, there is a 2.7 x 2.2 x 1.9 cm suspicious appearing nodule in the left upper lobe which likely represents a synchronous primary bronchogenic neoplasm. 4. In addition, there is a malignant appearing soft tissue mass in the left axilla measuring 3.5 x 3.3 x 3.3 cm. This could represent a nodal metastasis, but this would be highly unusual from a primary bronchogenic neoplasm. The possibility of a nodal metastasis from a lesion on the left upper extremity should be considered, and correlation with cutaneous physical examination is recommended. 5. For further evaluation of findings 2-4 above further evaluation with PET-CT is recommended in the near future. 6. Aortic atherosclerosis, in addition to left main and 3 vessel coronary artery disease. Assessment for potential risk factor modification, dietary therapy or pharmacologic therapy may be warranted, if clinically indicated. 7. Nonobstructive calculi measuring 2-4 mm in the collecting systems of both kidneys. Aortic Atherosclerosis (ICD10-I70.0). Electronically Signed   By: Vinnie Langton M.D.   On: 04/16/2018 22:13   Nm Pet Image Initial (pi) Skull Base To Thigh  Result Date: 05/05/2018 CLINICAL DATA:  Initial treatment strategy for pulmonary mass. EXAM: NUCLEAR MEDICINE PET SKULL BASE TO THIGH TECHNIQUE: 11.8 mCi F-18 FDG was injected intravenously. Full-ring PET imaging was performed from  the skull base to thigh after the radiotracer. CT data was obtained and used for attenuation correction and anatomic localization. Fasting blood glucose: 93 mg/dl COMPARISON:  CT 04/16/2018 FINDINGS: Mediastinal blood pool activity: SUV max 2.4 NECK: No hypermetabolic lymph nodes in the neck. Incidental CT findings: none CHEST: Intensely hypermetabolic RIGHT infrahilar mass with SUV max equal 21. The central portion of the mass is hypermetabolic and measures approximately 3.4 by 3.4 cm. More peripherally the mass is less metabolic and likely represents postobstructive collapse. No hypermetabolic mediastinal or supraclavicular nodes. Hypermetabolic mass in the LEFT upper lobe measures 2.2 cm with SUV max equal 9.6. Intensely hypermetabolic LEFT axillary mass measures 2.9 x 2.2 cm with SUV max equal 11.6. There is additional hypermetabolic LEFT axial lymph node which is small but intensely metabolic with SUV max equal 6.3 measuring less than 10 mm. Incidental CT findings: none ABDOMEN/PELVIS: No abnormal hypermetabolic activity within the liver, pancreas,  adrenal glands, or spleen. No hypermetabolic lymph nodes in the abdomen or pelvis. Incidental CT findings: Bilateral renal calculi SKELETON: No focal hypermetabolic activity to suggest skeletal metastasis. Incidental CT findings: none IMPRESSION: 1. Hypermetabolic RIGHT infrahilar mass and hypermetabolic LEFT upper lobe pulmonary nodule. Probable RIGHT lower lobe bronchogenic carcinoma with LEFT upper lobe metastasis. Synchronous carcinoma also consider with lack of mediastinal adenopathy. 2. Hypermetabolic LEFT axillary adenopathy consistent with metastasis. 3. No hypermetabolic mediastinal adenopathy. 4. No evidence metastatic disease below the diaphragms. Electronically Signed   By: Suzy Bouchard M.D.   On: 05/05/2018 17:24   Dg Chest Port 1 View  Result Date: 05/09/2018 CLINICAL DATA:  Status post bronchoscopy EXAM: PORTABLE CHEST 1 VIEW COMPARISON:   05/05/2018 FINDINGS: Cardiac shadow is enlarged but stable. The known bilateral basilar masses are again seen and stable. No pneumothorax is noted following bronchoscopy. No bony abnormality is seen. IMPRESSION: Stable masses, no evidence of pneumothorax. Electronically Signed   By: Inez Catalina M.D.   On: 05/09/2018 10:46   Dg C-arm Bronchoscopy  Result Date: 05/09/2018 C-ARM BRONCHOSCOPY: Fluoroscopy was utilized by the requesting physician.  No radiographic interpretation.     ASSESSMENT/PLAN:  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[place]) This is a very pleasant 72 year old white male with very light most smoking history recently diagnosed with stage IV (T3, N0, M1c) non-small cell lung cancer, squamous cell carcinoma based on the biopsy from the left axillary mass. The patient had a recent bronchoscopy and pathology report is pending. He had a CT scan of the head performed recently and is here to discuss the results.  The patient was seen with Dr. Julien Nordmann.  We discussed the CT of the head results which did not show any evidence of brain metastasis.  We discussed with the patient and his wife that his biopsy results are still pending and that upon initial discussion with the pathologist this morning look consistent with his prior biopsy.  However, a final report will be back later today or tomorrow. We discussed treatment for his squamous cell carcinoma and recommended proceeding with systemic treatment consisting of carboplatin for an AUC of 5, paclitaxel 175 mg/m, and Keytruda 200 mg IV every 3 weeks. Discussed with the patient adverse effects of this treatment including but not limited to alopecia, myelosuppression, nausea and vomiting, peripheral neuropathy, liver or renal dysfunction in addition to the ddverse effects of immunotherapy including but not limited to immune mediated the skin rash, diarrhea, inflammation of the lung, kidney, liver, thyroid or other endocrine  dysfunction. The patient is agreeable to proceed.  We will plan to call the patient only if his pending biopsy is not what we are expecting. The patient will be set up for chemotherapy education class.  He was given a prescription for Compazine 10 mg every 6 hours as needed for nausea and vomiting.  The patient will have weekly labs while on treatment.  Anticipate first cycle of chemotherapy to be given on 05/20/2018. The patient will follow-up for a return visit in approximately 4 weeks for evaluation prior to cycle #2 of his treatment.  For his gout, he will follow-up with his primary care provider.  We discussed that he may use the colchicine now prior to starting chemotherapy.  We discussed that this medication may drop his blood counts.  He was advised to call immediately if he has any concerning symptoms in the interval. The patient voices understanding of current disease status and treatment options and  is in agreement with the current care plan.  All questions were answered. The patient knows to call the clinic with any problems, questions or concerns. We can certainly see the patient much sooner if necessary.   Orders Placed This Encounter  Procedures  . CBC with Differential (Cancer Center Only)    Standing Status:   Standing    Number of Occurrences:   20    Standing Expiration Date:   05/14/2019  . CMP ([facility] only)    Standing Status:   Standing    Number of Occurrences:   20    Standing Expiration Date:   05/14/2019  . TSH    Standing Status:   Standing    Number of Occurrences:   20    Standing Expiration Date:   05/14/2019     Mikey Bussing, DNP, AGPCNP-BC, AOCNP 05/13/18   ADDENDUM: Hematology/Oncology Attending: I had a face-to-face encounter with the patient.  I recommended his care plan.  This is a very pleasant 72 years old white male recently diagnosed with metastatic non-small cell lung cancer, squamous cell carcinoma presented with right infrahilar mass in  addition to left upper lobe lung mass and left axillary mass with lymphadenopathy.  The biopsy from the axillary mass was consistent with squamous cell carcinoma.  The patient also has a bronchoscopy under the care of Dr. Lake Bells and the final pathology was also consistent with poorly differentiated squamous cell carcinoma.  He is currently undergoing palliative radiotherapy to the right infrahilar mass as well as the right axillary mass under the care of Dr. Lisbeth Renshaw. I had a lengthy discussion with the patient and his wife about his condition and treatment options.  I gave the patient the option of palliative care versus palliative systemic chemotherapy with carboplatin, paclitaxel and Keytruda. He is interested in proceeding with systemic chemotherapy.  I discussed with him the adverse effect of this treatment including but not limited to alopecia, myelosuppression, nausea and vomiting, peripheral neuropathy, liver or renal dysfunction as well as the adverse effect of the immunotherapy. He is expected to start the first cycle of this treatment next week. We will arrange for the patient to have a chemotherapy education class before the first dose of his treatment. We will call his pharmacy with prescription for Compazine 10 mg p.o. every 6 hours as needed for nausea. He will come back for follow-up visit in 2 weeks for evaluation and management of any adverse effect of his treatment. He was advised to call immediately if he has any concerning symptoms in the interval.  Disclaimer: This note was dictated with voice recognition software. Similar sounding words can inadvertently be transcribed and may be missed upon review. Eilleen Kempf, MD 05/17/18

## 2018-05-13 NOTE — Progress Notes (Signed)
START ON PATHWAY REGIMEN - Non-Small Cell Lung     A cycle is every 21 days:     Pembrolizumab      Paclitaxel      Carboplatin   **Always confirm dose/schedule in your pharmacy ordering system**  Patient Characteristics: Stage IV Metastatic, Squamous, PS = 0, 1, First Line, PD-L1 Expression Positive 1-49% (TPS) / Negative / Not Tested / Awaiting Test Results and Immunotherapy Candidate AJCC T Category: T3 Current Disease Status: Distant Metastases AJCC N Category: N0 AJCC M Category: M1c AJCC 8 Stage Grouping: IVB Histology: Squamous Cell Line of therapy: First Line PD-L1 Expression Status: Awaiting Test Results Performance Status: PS = 0, 1 Immunotherapy Candidate Status: Candidate for Immunotherapy Intent of Therapy: Non-Curative / Palliative Intent, Discussed with Patient

## 2018-05-13 NOTE — Patient Instructions (Signed)
Carboplatin injection What is this medicine? CARBOPLATIN (KAR boe pla tin) is a chemotherapy drug. It targets fast dividing cells, like cancer cells, and causes these cells to die. This medicine is used to treat ovarian cancer and many other cancers. This medicine may be used for other purposes; ask your health care provider or pharmacist if you have questions. COMMON BRAND NAME(S): Paraplatin What should I tell my health care provider before I take this medicine? They need to know if you have any of these conditions: -blood disorders -hearing problems -kidney disease -recent or ongoing radiation therapy -an unusual or allergic reaction to carboplatin, cisplatin, other chemotherapy, other medicines, foods, dyes, or preservatives -pregnant or trying to get pregnant -breast-feeding How should I use this medicine? This drug is usually given as an infusion into a vein. It is administered in a hospital or clinic by a specially trained health care professional. Talk to your pediatrician regarding the use of this medicine in children. Special care may be needed. Overdosage: If you think you have taken too much of this medicine contact a poison control center or emergency room at once. NOTE: This medicine is only for you. Do not share this medicine with others. What if I miss a dose? It is important not to miss a dose. Call your doctor or health care professional if you are unable to keep an appointment. What may interact with this medicine? -medicines for seizures -medicines to increase blood counts like filgrastim, pegfilgrastim, sargramostim -some antibiotics like amikacin, gentamicin, neomycin, streptomycin, tobramycin -vaccines Talk to your doctor or health care professional before taking any of these medicines: -acetaminophen -aspirin -ibuprofen -ketoprofen -naproxen This list may not describe all possible interactions. Give your health care provider a list of all the medicines, herbs,  non-prescription drugs, or dietary supplements you use. Also tell them if you smoke, drink alcohol, or use illegal drugs. Some items may interact with your medicine. What should I watch for while using this medicine? Your condition will be monitored carefully while you are receiving this medicine. You will need important blood work done while you are taking this medicine. This drug may make you feel generally unwell. This is not uncommon, as chemotherapy can affect healthy cells as well as cancer cells. Report any side effects. Continue your course of treatment even though you feel ill unless your doctor tells you to stop. In some cases, you may be given additional medicines to help with side effects. Follow all directions for their use. Call your doctor or health care professional for advice if you get a fever, chills or sore throat, or other symptoms of a cold or flu. Do not treat yourself. This drug decreases your body's ability to fight infections. Try to avoid being around people who are sick. This medicine may increase your risk to bruise or bleed. Call your doctor or health care professional if you notice any unusual bleeding. Be careful brushing and flossing your teeth or using a toothpick because you may get an infection or bleed more easily. If you have any dental work done, tell your dentist you are receiving this medicine. Avoid taking products that contain aspirin, acetaminophen, ibuprofen, naproxen, or ketoprofen unless instructed by your doctor. These medicines may hide a fever. Do not become pregnant while taking this medicine. Women should inform their doctor if they wish to become pregnant or think they might be pregnant. There is a potential for serious side effects to an unborn child. Talk to your health care professional or  pharmacist for more information. Do not breast-feed an infant while taking this medicine. What side effects may I notice from receiving this medicine? Side effects  that you should report to your doctor or health care professional as soon as possible: -allergic reactions like skin rash, itching or hives, swelling of the face, lips, or tongue -signs of infection - fever or chills, cough, sore throat, pain or difficulty passing urine -signs of decreased platelets or bleeding - bruising, pinpoint red spots on the skin, black, tarry stools, nosebleeds -signs of decreased red blood cells - unusually weak or tired, fainting spells, lightheadedness -breathing problems -changes in hearing -changes in vision -chest pain -high blood pressure -low blood counts - This drug may decrease the number of white blood cells, red blood cells and platelets. You may be at increased risk for infections and bleeding. -nausea and vomiting -pain, swelling, redness or irritation at the injection site -pain, tingling, numbness in the hands or feet -problems with balance, talking, walking -trouble passing urine or change in the amount of urine Side effects that usually do not require medical attention (report to your doctor or health care professional if they continue or are bothersome): -hair loss -loss of appetite -metallic taste in the mouth or changes in taste This list may not describe all possible side effects. Call your doctor for medical advice about side effects. You may report side effects to FDA at 1-800-FDA-1088. Where should I keep my medicine? This drug is given in a hospital or clinic and will not be stored at home. NOTE: This sheet is a summary. It may not cover all possible information. If you have questions about this medicine, talk to your doctor, pharmacist, or health care provider.  2018 Elsevier/Gold Standard (2007-12-02 14:38:05)  Paclitaxel injection What is this medicine? PACLITAXEL (PAK li TAX el) is a chemotherapy drug. It targets fast dividing cells, like cancer cells, and causes these cells to die. This medicine is used to treat ovarian cancer,  breast cancer, and other cancers. This medicine may be used for other purposes; ask your health care provider or pharmacist if you have questions. COMMON BRAND NAME(S): Onxol, Taxol What should I tell my health care provider before I take this medicine? They need to know if you have any of these conditions: -blood disorders -irregular heartbeat -infection (especially a virus infection such as chickenpox, cold sores, or herpes) -liver disease -previous or ongoing radiation therapy -an unusual or allergic reaction to paclitaxel, alcohol, polyoxyethylated castor oil, other chemotherapy agents, other medicines, foods, dyes, or preservatives -pregnant or trying to get pregnant -breast-feeding How should I use this medicine? This drug is given as an infusion into a vein. It is administered in a hospital or clinic by a specially trained health care professional. Talk to your pediatrician regarding the use of this medicine in children. Special care may be needed. Overdosage: If you think you have taken too much of this medicine contact a poison control center or emergency room at once. NOTE: This medicine is only for you. Do not share this medicine with others. What if I miss a dose? It is important not to miss your dose. Call your doctor or health care professional if you are unable to keep an appointment. What may interact with this medicine? Do not take this medicine with any of the following medications: -disulfiram -metronidazole This medicine may also interact with the following medications: -cyclosporine -diazepam -ketoconazole -medicines to increase blood counts like filgrastim, pegfilgrastim, sargramostim -other chemotherapy drugs like  cisplatin, doxorubicin, epirubicin, etoposide, teniposide, vincristine -quinidine -testosterone -vaccines -verapamil Talk to your doctor or health care professional before taking any of these  medicines: -acetaminophen -aspirin -ibuprofen -ketoprofen -naproxen This list may not describe all possible interactions. Give your health care provider a list of all the medicines, herbs, non-prescription drugs, or dietary supplements you use. Also tell them if you smoke, drink alcohol, or use illegal drugs. Some items may interact with your medicine. What should I watch for while using this medicine? Your condition will be monitored carefully while you are receiving this medicine. You will need important blood work done while you are taking this medicine. This medicine can cause serious allergic reactions. To reduce your risk you will need to take other medicine(s) before treatment with this medicine. If you experience allergic reactions like skin rash, itching or hives, swelling of the face, lips, or tongue, tell your doctor or health care professional right away. In some cases, you may be given additional medicines to help with side effects. Follow all directions for their use. This drug may make you feel generally unwell. This is not uncommon, as chemotherapy can affect healthy cells as well as cancer cells. Report any side effects. Continue your course of treatment even though you feel ill unless your doctor tells you to stop. Call your doctor or health care professional for advice if you get a fever, chills or sore throat, or other symptoms of a cold or flu. Do not treat yourself. This drug decreases your body's ability to fight infections. Try to avoid being around people who are sick. This medicine may increase your risk to bruise or bleed. Call your doctor or health care professional if you notice any unusual bleeding. Be careful brushing and flossing your teeth or using a toothpick because you may get an infection or bleed more easily. If you have any dental work done, tell your dentist you are receiving this medicine. Avoid taking products that contain aspirin, acetaminophen, ibuprofen,  naproxen, or ketoprofen unless instructed by your doctor. These medicines may hide a fever. Do not become pregnant while taking this medicine. Women should inform their doctor if they wish to become pregnant or think they might be pregnant. There is a potential for serious side effects to an unborn child. Talk to your health care professional or pharmacist for more information. Do not breast-feed an infant while taking this medicine. Men are advised not to father a child while receiving this medicine. This product may contain alcohol. Ask your pharmacist or healthcare provider if this medicine contains alcohol. Be sure to tell all healthcare providers you are taking this medicine. Certain medicines, like metronidazole and disulfiram, can cause an unpleasant reaction when taken with alcohol. The reaction includes flushing, headache, nausea, vomiting, sweating, and increased thirst. The reaction can last from 30 minutes to several hours. What side effects may I notice from receiving this medicine? Side effects that you should report to your doctor or health care professional as soon as possible: -allergic reactions like skin rash, itching or hives, swelling of the face, lips, or tongue -low blood counts - This drug may decrease the number of white blood cells, red blood cells and platelets. You may be at increased risk for infections and bleeding. -signs of infection - fever or chills, cough, sore throat, pain or difficulty passing urine -signs of decreased platelets or bleeding - bruising, pinpoint red spots on the skin, black, tarry stools, nosebleeds -signs of decreased red blood cells - unusually weak  or tired, fainting spells, lightheadedness -breathing problems -chest pain -high or low blood pressure -mouth sores -nausea and vomiting -pain, swelling, redness or irritation at the injection site -pain, tingling, numbness in the hands or feet -slow or irregular heartbeat -swelling of the ankle,  feet, hands Side effects that usually do not require medical attention (report to your doctor or health care professional if they continue or are bothersome): -bone pain -complete hair loss including hair on your head, underarms, pubic hair, eyebrows, and eyelashes -changes in the color of fingernails -diarrhea -loosening of the fingernails -loss of appetite -muscle or joint pain -red flush to skin -sweating This list may not describe all possible side effects. Call your doctor for medical advice about side effects. You may report side effects to FDA at 1-800-FDA-1088. Where should I keep my medicine? This drug is given in a hospital or clinic and will not be stored at home. NOTE: This sheet is a summary. It may not cover all possible information. If you have questions about this medicine, talk to your doctor, pharmacist, or health care provider.  2018 Elsevier/Gold Standard (2015-06-28 19:58:00)  Pembrolizumab injection What is this medicine? PEMBROLIZUMAB (pem broe liz ue mab) is a monoclonal antibody. It is used to treat melanoma, head and neck cancer, Hodgkin lymphoma, non-small cell lung cancer, urothelial cancer, stomach cancer, and cancers that have a certain genetic condition. This medicine may be used for other purposes; ask your health care provider or pharmacist if you have questions. COMMON BRAND NAME(S): Keytruda What should I tell my health care provider before I take this medicine? They need to know if you have any of these conditions: -diabetes -immune system problems -inflammatory bowel disease -liver disease -lung or breathing disease -lupus -organ transplant -an unusual or allergic reaction to pembrolizumab, other medicines, foods, dyes, or preservatives -pregnant or trying to get pregnant -breast-feeding How should I use this medicine? This medicine is for infusion into a vein. It is given by a health care professional in a hospital or clinic setting. A  special MedGuide will be given to you before each treatment. Be sure to read this information carefully each time. Talk to your pediatrician regarding the use of this medicine in children. While this drug may be prescribed for selected conditions, precautions do apply. Overdosage: If you think you have taken too much of this medicine contact a poison control center or emergency room at once. NOTE: This medicine is only for you. Do not share this medicine with others. What if I miss a dose? It is important not to miss your dose. Call your doctor or health care professional if you are unable to keep an appointment. What may interact with this medicine? Interactions have not been studied. Give your health care provider a list of all the medicines, herbs, non-prescription drugs, or dietary supplements you use. Also tell them if you smoke, drink alcohol, or use illegal drugs. Some items may interact with your medicine. This list may not describe all possible interactions. Give your health care provider a list of all the medicines, herbs, non-prescription drugs, or dietary supplements you use. Also tell them if you smoke, drink alcohol, or use illegal drugs. Some items may interact with your medicine. What should I watch for while using this medicine? Your condition will be monitored carefully while you are receiving this medicine. You may need blood work done while you are taking this medicine. Do not become pregnant while taking this medicine or for 4 months after  stopping it. Women should inform their doctor if they wish to become pregnant or think they might be pregnant. There is a potential for serious side effects to an unborn child. Talk to your health care professional or pharmacist for more information. Do not breast-feed an infant while taking this medicine or for 4 months after the last dose. What side effects may I notice from receiving this medicine? Side effects that you should report to your  doctor or health care professional as soon as possible: -allergic reactions like skin rash, itching or hives, swelling of the face, lips, or tongue -bloody or black, tarry -breathing problems -changes in vision -chest pain -chills -constipation -cough -dizziness or feeling faint or lightheaded -fast or irregular heartbeat -fever -flushing -hair loss -low blood counts - this medicine may decrease the number of white blood cells, red blood cells and platelets. You may be at increased risk for infections and bleeding. -muscle pain -muscle weakness -persistent headache -signs and symptoms of high blood sugar such as dizziness; dry mouth; dry skin; fruity breath; nausea; stomach pain; increased hunger or thirst; increased urination -signs and symptoms of kidney injury like trouble passing urine or change in the amount of urine -signs and symptoms of liver injury like dark urine, light-colored stools, loss of appetite, nausea, right upper belly pain, yellowing of the eyes or skin -stomach pain -sweating -weight loss Side effects that usually do not require medical attention (report to your doctor or health care professional if they continue or are bothersome): -decreased appetite -diarrhea -tiredness This list may not describe all possible side effects. Call your doctor for medical advice about side effects. You may report side effects to FDA at 1-800-FDA-1088. Where should I keep my medicine? This drug is given in a hospital or clinic and will not be stored at home. NOTE: This sheet is a summary. It may not cover all possible information. If you have questions about this medicine, talk to your doctor, pharmacist, or health care provider.  2018 Elsevier/Gold Standard (2016-06-05 12:29:36)

## 2018-05-13 NOTE — Telephone Encounter (Signed)
Printed patient avs and will print calender when patient return for chemo edu class. Per 9/3 los . Infusion for  Next week had to be added to book. Per 9/3 los

## 2018-05-14 ENCOUNTER — Ambulatory Visit
Admission: RE | Admit: 2018-05-14 | Discharge: 2018-05-14 | Disposition: A | Discharge status: Home / Self Care | Payer: Medicare Other | Source: Ambulatory Visit | Attending: Radiation Oncology | Admitting: Radiation Oncology

## 2018-05-14 DIAGNOSIS — Z87891 Personal history of nicotine dependence: Secondary | ICD-10-CM | POA: Diagnosis not present

## 2018-05-14 DIAGNOSIS — Z510 Encounter for antineoplastic radiation therapy: Secondary | ICD-10-CM | POA: Diagnosis not present

## 2018-05-14 DIAGNOSIS — C773 Secondary and unspecified malignant neoplasm of axilla and upper limb lymph nodes: Secondary | ICD-10-CM | POA: Diagnosis not present

## 2018-05-14 DIAGNOSIS — C3431 Malignant neoplasm of lower lobe, right bronchus or lung: Secondary | ICD-10-CM | POA: Diagnosis not present

## 2018-05-14 DIAGNOSIS — C7801 Secondary malignant neoplasm of right lung: Secondary | ICD-10-CM | POA: Diagnosis not present

## 2018-05-14 DIAGNOSIS — C7802 Secondary malignant neoplasm of left lung: Secondary | ICD-10-CM | POA: Diagnosis not present

## 2018-05-15 ENCOUNTER — Encounter: Payer: Self-pay | Admitting: Pediatrics

## 2018-05-15 ENCOUNTER — Ambulatory Visit
Admission: RE | Admit: 2018-05-15 | Discharge: 2018-05-15 | Disposition: A | Discharge status: Home / Self Care | Payer: Medicare Other | Source: Ambulatory Visit | Attending: Radiation Oncology | Admitting: Radiation Oncology

## 2018-05-15 ENCOUNTER — Encounter: Payer: Self-pay | Admitting: *Deleted

## 2018-05-15 ENCOUNTER — Ambulatory Visit (INDEPENDENT_AMBULATORY_CARE_PROVIDER_SITE_OTHER): Payer: Medicare Other | Admitting: Pediatrics

## 2018-05-15 ENCOUNTER — Inpatient Hospital Stay: Payer: Medicare Other

## 2018-05-15 ENCOUNTER — Telehealth: Payer: Self-pay | Admitting: Pulmonary Disease

## 2018-05-15 ENCOUNTER — Other Ambulatory Visit: Payer: Self-pay | Admitting: *Deleted

## 2018-05-15 VITALS — BP 115/76 | HR 70 | Temp 97.7°F | Ht 73.0 in | Wt 236.0 lb

## 2018-05-15 DIAGNOSIS — Z510 Encounter for antineoplastic radiation therapy: Secondary | ICD-10-CM | POA: Diagnosis not present

## 2018-05-15 DIAGNOSIS — C773 Secondary and unspecified malignant neoplasm of axilla and upper limb lymph nodes: Secondary | ICD-10-CM | POA: Diagnosis not present

## 2018-05-15 DIAGNOSIS — I10 Essential (primary) hypertension: Secondary | ICD-10-CM | POA: Diagnosis not present

## 2018-05-15 DIAGNOSIS — C3431 Malignant neoplasm of lower lobe, right bronchus or lung: Secondary | ICD-10-CM | POA: Diagnosis not present

## 2018-05-15 DIAGNOSIS — Z87891 Personal history of nicotine dependence: Secondary | ICD-10-CM | POA: Diagnosis not present

## 2018-05-15 DIAGNOSIS — C7801 Secondary malignant neoplasm of right lung: Secondary | ICD-10-CM | POA: Diagnosis not present

## 2018-05-15 DIAGNOSIS — Z8739 Personal history of other diseases of the musculoskeletal system and connective tissue: Secondary | ICD-10-CM | POA: Diagnosis not present

## 2018-05-15 DIAGNOSIS — C7802 Secondary malignant neoplasm of left lung: Secondary | ICD-10-CM | POA: Diagnosis not present

## 2018-05-15 NOTE — Telephone Encounter (Signed)
Left message for patient to call back  

## 2018-05-15 NOTE — Telephone Encounter (Signed)
Spoke with pt, he starts his chemo next Tuesday and he is concerned about his energy level for performing a PFT. Please advise if we should hold off on PFT. BQ is not here for the next couple of days. CY can you advise or should we wait until BQ comes back.   Current Outpatient Medications on File Prior to Visit  Medication Sig Dispense Refill  . albuterol (PROVENTIL) (2.5 MG/3ML) 0.083% nebulizer solution Take 3 mLs (2.5 mg total) by nebulization every 6 (six) hours as needed for wheezing or shortness of breath. 75 mL 12  . ALPRAZolam (XANAX) 0.25 MG tablet Take 1 tablet (0.25 mg total) by mouth at bedtime as needed for anxiety. 30 tablet 0  . Artificial Tear Solution (SOOTHE XP OP) Place 1 drop into both eyes 2 (two) times daily.    . carvedilol (COREG) 12.5 MG tablet TAKE 1 AND 1/2 TABLETS BY MOUTH TWO TIMES DAILY WITH A MEAL (Patient taking differently: Take 18.75 mg by mouth 2 (two) times daily with a meal. ) 270 tablet 3  . ferrous sulfate 325 (65 FE) MG tablet Take 325 mg by mouth daily with breakfast.    . Glucosamine-Chondroitin (OSTEO BI-FLEX REGULAR STRENGTH PO) Take 1 tablet by mouth daily.    . Ibuprofen-diphenhydrAMINE HCl (ADVIL PM) 200-25 MG CAPS Take 1 capsule by mouth at bedtime as needed (for sleep).    . Multiple Vitamin (MULTIVITAMIN) tablet Take 1 tablet by mouth daily.    . pravastatin (PRAVACHOL) 40 MG tablet TAKE 1 TABLET BY MOUTH EVERY DAY (Patient taking differently: Take 40 mg by mouth at bedtime. ) 90 tablet 3  . prochlorperazine (COMPAZINE) 10 MG tablet Take 1 tablet (10 mg total) by mouth every 6 (six) hours as needed for nausea or vomiting. 30 tablet 1  . Wound Dressings (SONAFINE EX) Apply 1 application topically daily.     No current facility-administered medications on file prior to visit.    Allergies  Allergen Reactions  . Trazodone And Nefazodone Palpitations

## 2018-05-15 NOTE — Telephone Encounter (Signed)
Attempted to call patient, no answer. Left a detailed message (DPR) regarding Dr. Janee Morn suggestions of completing PFT prior to next OV and if he needs to reschedule to give [place] a call. Nothing further needed at this time.

## 2018-05-15 NOTE — Telephone Encounter (Signed)
Pt returned called. Contact number [phone]

## 2018-05-15 NOTE — Telephone Encounter (Signed)
Ok to reschedule PFT, so long as results available by next time patient sees Dr Lake Bells

## 2018-05-15 NOTE — Progress Notes (Signed)
Oncology Nurse Navigator Documentation  Oncology Nurse Navigator Flowsheets 05/15/2018  Navigator Location [facility]  Navigator Encounter Type Other/per Dr. Julien Nordmann I requested PDL 1 testing on recent pathology.  Treatment Phase Treatment  Barriers/Navigation Needs Coordination of Care  Interventions Coordination of Care  Coordination of Care Other  Acuity Level 2  Time Spent with Patient 15

## 2018-05-15 NOTE — Progress Notes (Signed)
  Subjective:   Patient ID: Travis Padilla, male    DOB: 1946/05/12, 72 y.o.   MRN: [id number] CC: Medical Management of Chronic Issues  HPI: Travis Padilla is a 72 y.o. male   Hypertension: Diltiazem was stopped over the summer.  He has taken it a few times off and on over the last couple weeks.  He continues to take carvedilol twice a day.  Lung cancer: Following in [place] with Dr. Earlie Server.  Has started radiation therapy.  Going to start chemotherapy soon.  Breathing is easier, no longer wheezing.  Appetite has been down.  Gout: Recent gout flare, was seen at urgent care.  Now off of red meat, shellfish.  Relevant past medical, surgical, family and social history reviewed. Allergies and medications reviewed and updated. Social History   Tobacco Use  Smoking Status Former Smoker  . Packs/day: 0.20  . Years: 2.00  . Pack years: 0.40  . Types: Cigarettes  . Start date: 10/01/1968  Smokeless Tobacco Never Used   ROS: Per HPI   Objective:    BP 115/76   Pulse 70   Temp 97.7 F (36.5 C) (Oral)   Ht 6\' 1"  (1.854 m)   Wt 236 lb (107 kg)   BMI 31.14 kg/m   Wt Readings from Last 3 Encounters:  05/15/18 236 lb (107 kg)  05/13/18 236 lb 3.2 oz (107.1 kg)  05/07/18 236 lb 4.8 oz (107.2 kg)    Gen: NAD, alert, cooperative with exam, NCAT EYES: EOMI, no conjunctival injection, or no icterus CV: NRRR, normal S1/S2, no murmur, distal pulses 2+ b/l Resp: CTABL, no wheezes, normal WOB Ext: No edema, warm Neuro: Alert and oriented, strength equal b/l UE and LE, coordination grossly normal MSK: normal muscle bulk  Assessment & Plan:  Travis Padilla was seen today for medical management of chronic issues.  Diagnoses and all orders for this visit:  Essential hypertension, benign Well-controlled today. Continue to check blood pressures as able.  Let me know if regularly elevated.  H/O: gout Recent flare, now off of colchicine.  Follow up plan: Return in about 6 months (around  11/13/2018). Assunta Found, MD [place]

## 2018-05-16 ENCOUNTER — Ambulatory Visit
Admission: RE | Admit: 2018-05-16 | Discharge: 2018-05-16 | Disposition: A | Discharge status: Home / Self Care | Payer: Medicare Other | Source: Ambulatory Visit | Attending: Radiation Oncology | Admitting: Radiation Oncology

## 2018-05-16 ENCOUNTER — Encounter: Payer: Self-pay | Admitting: *Deleted

## 2018-05-16 DIAGNOSIS — C773 Secondary and unspecified malignant neoplasm of axilla and upper limb lymph nodes: Secondary | ICD-10-CM | POA: Diagnosis not present

## 2018-05-16 DIAGNOSIS — C3431 Malignant neoplasm of lower lobe, right bronchus or lung: Secondary | ICD-10-CM | POA: Diagnosis not present

## 2018-05-16 DIAGNOSIS — C7801 Secondary malignant neoplasm of right lung: Secondary | ICD-10-CM | POA: Diagnosis not present

## 2018-05-16 DIAGNOSIS — Z510 Encounter for antineoplastic radiation therapy: Secondary | ICD-10-CM | POA: Diagnosis not present

## 2018-05-16 DIAGNOSIS — Z87891 Personal history of nicotine dependence: Secondary | ICD-10-CM | POA: Diagnosis not present

## 2018-05-16 DIAGNOSIS — C7802 Secondary malignant neoplasm of left lung: Secondary | ICD-10-CM | POA: Diagnosis not present

## 2018-05-19 ENCOUNTER — Inpatient Hospital Stay: Payer: Medicare Other

## 2018-05-19 ENCOUNTER — Ambulatory Visit
Admission: RE | Admit: 2018-05-19 | Discharge: 2018-05-19 | Disposition: A | Discharge status: Home / Self Care | Payer: Medicare Other | Source: Ambulatory Visit | Attending: Radiation Oncology | Admitting: Radiation Oncology

## 2018-05-19 DIAGNOSIS — M10062 Idiopathic gout, left knee: Secondary | ICD-10-CM | POA: Diagnosis not present

## 2018-05-19 DIAGNOSIS — C3491 Malignant neoplasm of unspecified part of right bronchus or lung: Secondary | ICD-10-CM | POA: Diagnosis not present

## 2018-05-19 DIAGNOSIS — Z5111 Encounter for antineoplastic chemotherapy: Secondary | ICD-10-CM

## 2018-05-19 DIAGNOSIS — Z5112 Encounter for antineoplastic immunotherapy: Secondary | ICD-10-CM | POA: Diagnosis not present

## 2018-05-19 DIAGNOSIS — R5383 Other fatigue: Secondary | ICD-10-CM

## 2018-05-19 DIAGNOSIS — C7800 Secondary malignant neoplasm of unspecified lung: Secondary | ICD-10-CM | POA: Insufficient documentation

## 2018-05-19 DIAGNOSIS — G620 Drug-induced polyneuropathy: Secondary | ICD-10-CM | POA: Diagnosis not present

## 2018-05-19 DIAGNOSIS — Z87891 Personal history of nicotine dependence: Secondary | ICD-10-CM | POA: Diagnosis not present

## 2018-05-19 DIAGNOSIS — C3431 Malignant neoplasm of lower lobe, right bronchus or lung: Secondary | ICD-10-CM | POA: Diagnosis not present

## 2018-05-19 DIAGNOSIS — Z510 Encounter for antineoplastic radiation therapy: Secondary | ICD-10-CM | POA: Diagnosis not present

## 2018-05-19 DIAGNOSIS — C7801 Secondary malignant neoplasm of right lung: Secondary | ICD-10-CM | POA: Diagnosis not present

## 2018-05-19 DIAGNOSIS — C7802 Secondary malignant neoplasm of left lung: Secondary | ICD-10-CM | POA: Diagnosis not present

## 2018-05-19 DIAGNOSIS — C773 Secondary and unspecified malignant neoplasm of axilla and upper limb lymph nodes: Secondary | ICD-10-CM | POA: Diagnosis not present

## 2018-05-19 HISTORY — DX: Secondary malignant neoplasm of unspecified lung: C78.00

## 2018-05-19 LAB — CBC WITH DIFFERENTIAL (CANCER CENTER ONLY)
Basophils Absolute: 0 10*3/uL (ref 0.0–0.1)
Basophils Relative: 0 %
Eosinophils Absolute: 0.1 10*3/uL (ref 0.0–0.5)
Eosinophils Relative: 2 %
HCT: 33.3 % — ABNORMAL LOW (ref 38.4–49.9)
Hemoglobin: 11.1 g/dL — ABNORMAL LOW (ref 13.0–17.1)
Lymphocytes Relative: 5 %
Lymphs Abs: 0.3 10*3/uL — ABNORMAL LOW (ref 0.9–3.3)
MCH: 29.7 pg (ref 27.2–33.4)
MCHC: 33.4 g/dL (ref 32.0–36.0)
MCV: 89 fL (ref 79.3–98.0)
Monocytes Absolute: 0.8 10*3/uL (ref 0.1–0.9)
Monocytes Relative: 13 %
Neutro Abs: 4.7 10*3/uL (ref 1.5–6.5)
Neutrophils Relative %: 80 %
Platelet Count: 157 10*3/uL (ref 140–400)
RBC: 3.74 MIL/uL — ABNORMAL LOW (ref 4.20–5.82)
RDW: 13.4 % (ref 11.0–14.6)
WBC Count: 5.9 10*3/uL (ref 4.0–10.3)

## 2018-05-19 LAB — CMP (CANCER CENTER ONLY)
ALBUMIN: 3 g/dL — AB (ref 3.5–5.0)
ALK PHOS: 52 U/L (ref 38–126)
ALT: 10 U/L (ref 0–44)
AST: 11 U/L — ABNORMAL LOW (ref 15–41)
Anion gap: 9 (ref 5–15)
BUN: 13 mg/dL (ref 8–23)
CALCIUM: 9.2 mg/dL (ref 8.9–10.3)
CO2: 27 mmol/L (ref 22–32)
CREATININE: 0.89 mg/dL (ref 0.61–1.24)
Chloride: 107 mmol/L (ref 98–111)
GFR, Estimated: >60 mL/min (ref >60)
GLUCOSE: 120 mg/dL — AB (ref 70–99)
Potassium: 4.2 mmol/L (ref 3.5–5.1)
SODIUM: 143 mmol/L (ref 135–145)
Total Bilirubin: 0.7 mg/dL (ref 0.3–1.2)
Total Protein: 7 g/dL (ref 6.5–8.1)

## 2018-05-19 LAB — TSH: TSH: 3.229 u[IU]/mL (ref 0.320–4.118)

## 2018-05-19 NOTE — Progress Notes (Signed)
  Radiation Oncology         [phone] ________________________________  Name: Travis Padilla MRN: [id number]  Date: 04/24/2018  DOB: 25-Mar-1946  SIMULATION AND TREATMENT PLANNING NOTE  DIAGNOSIS:     ICD-10-CM   1. Malignant neoplasm metastatic to right lung ([facility]) C78.01      Site:   1.  Right lung 2.  Left axilla  NARRATIVE:  The patient was brought to the [facility].  Identity was confirmed.  All relevant records and images related to the planned course of therapy were reviewed.   Written consent to proceed with treatment was confirmed which was freely given after reviewing the details related to the planned course of therapy had been reviewed with the patient.  Then, the patient was set-up in a stable reproducible  supine position for radiation therapy.  CT images were obtained.  Surface markings were placed.    Medically necessary complex treatment device(s) for immobilization: Customized Vac-Lok bag.   The CT images were loaded into the planning software.  Then the target and avoidance structures were contoured.  Treatment planning then occurred.  The radiation prescription was entered and confirmed.  A total of 5 complex treatment devices were fabricated which relate to the designed radiation treatment fields to treat the 2 separate target regions listed above. Each of these customized fields/ complex treatment devices will be used on a daily basis during the radiation course. I have requested : 3D Simulation  I have requested a DVH of the following structures: Target volume, spinal cord, lungs.   The patient will undergo daily image guidance to ensure accurate localization of the target, and adequate minimize dose to the normal surrounding structures in close proximity to the target.   PLAN:  The patient will receive 37.5 Gy in 15 fractions.  Special treatment procedure The patient will also receive concurrent chemotherapy during the treatment. The patient  may therefore experience increased toxicity or side effects and the patient will be monitored for such problems. This may require extra lab work as necessary. This therefore constitutes a special treatment procedure.   ________________________________   Jodelle Gross, MD, PhD

## 2018-05-20 ENCOUNTER — Ambulatory Visit
Admission: RE | Admit: 2018-05-20 | Discharge: 2018-05-20 | Disposition: A | Discharge status: Home / Self Care | Payer: Medicare Other | Source: Ambulatory Visit | Attending: Radiation Oncology | Admitting: Radiation Oncology

## 2018-05-20 ENCOUNTER — Inpatient Hospital Stay: Payer: Medicare Other

## 2018-05-20 VITALS — BP 134/84 | HR 67 | Temp 97.8°F | Resp 14

## 2018-05-20 DIAGNOSIS — C3491 Malignant neoplasm of unspecified part of right bronchus or lung: Secondary | ICD-10-CM

## 2018-05-20 DIAGNOSIS — C7802 Secondary malignant neoplasm of left lung: Secondary | ICD-10-CM | POA: Diagnosis not present

## 2018-05-20 DIAGNOSIS — C773 Secondary and unspecified malignant neoplasm of axilla and upper limb lymph nodes: Secondary | ICD-10-CM | POA: Diagnosis not present

## 2018-05-20 DIAGNOSIS — G620 Drug-induced polyneuropathy: Secondary | ICD-10-CM | POA: Diagnosis not present

## 2018-05-20 DIAGNOSIS — Z5112 Encounter for antineoplastic immunotherapy: Secondary | ICD-10-CM | POA: Diagnosis not present

## 2018-05-20 DIAGNOSIS — C7801 Secondary malignant neoplasm of right lung: Secondary | ICD-10-CM | POA: Diagnosis not present

## 2018-05-20 DIAGNOSIS — Z510 Encounter for antineoplastic radiation therapy: Secondary | ICD-10-CM | POA: Diagnosis not present

## 2018-05-20 DIAGNOSIS — C3431 Malignant neoplasm of lower lobe, right bronchus or lung: Secondary | ICD-10-CM | POA: Diagnosis not present

## 2018-05-20 DIAGNOSIS — Z87891 Personal history of nicotine dependence: Secondary | ICD-10-CM | POA: Diagnosis not present

## 2018-05-20 DIAGNOSIS — Z5111 Encounter for antineoplastic chemotherapy: Secondary | ICD-10-CM | POA: Diagnosis not present

## 2018-05-20 DIAGNOSIS — M10062 Idiopathic gout, left knee: Secondary | ICD-10-CM | POA: Diagnosis not present

## 2018-05-20 MED ORDER — SODIUM CHLORIDE 0.9 % IV SOLN
650.0000 mg | Freq: Once | INTRAVENOUS | Status: AC
  Administered 2018-05-20: 650 mg via INTRAVENOUS
  Filled 2018-05-20: qty 65

## 2018-05-20 MED ORDER — FAMOTIDINE IN NACL 20-0.9 MG/50ML-% IV SOLN
20.0000 mg | Freq: Once | INTRAVENOUS | Status: AC
  Administered 2018-05-20: 20 mg via INTRAVENOUS

## 2018-05-20 MED ORDER — DIPHENHYDRAMINE HCL 50 MG/ML IJ SOLN
50.0000 mg | Freq: Once | INTRAMUSCULAR | Status: AC
  Administered 2018-05-20: 50 mg via INTRAVENOUS

## 2018-05-20 MED ORDER — SODIUM CHLORIDE 0.9 % IV SOLN
175.0000 mg/m2 | Freq: Once | INTRAVENOUS | Status: AC
  Administered 2018-05-20: 414 mg via INTRAVENOUS
  Filled 2018-05-20: qty 69

## 2018-05-20 MED ORDER — FAMOTIDINE IN NACL 20-0.9 MG/50ML-% IV SOLN
INTRAVENOUS | Status: AC
  Filled 2018-05-20: qty 50

## 2018-05-20 MED ORDER — PALONOSETRON HCL INJECTION 0.25 MG/5ML
INTRAVENOUS | Status: AC
  Filled 2018-05-20: qty 5

## 2018-05-20 MED ORDER — SODIUM CHLORIDE 0.9 % IV SOLN
200.0000 mg | Freq: Once | INTRAVENOUS | Status: AC
  Administered 2018-05-20: 200 mg via INTRAVENOUS
  Filled 2018-05-20: qty 8

## 2018-05-20 MED ORDER — SODIUM CHLORIDE 0.9 % IV SOLN
Freq: Once | INTRAVENOUS | Status: AC
  Administered 2018-05-20: 10:00:00 via INTRAVENOUS
  Filled 2018-05-20: qty 5

## 2018-05-20 MED ORDER — DIPHENHYDRAMINE HCL 50 MG/ML IJ SOLN
INTRAMUSCULAR | Status: AC
  Filled 2018-05-20: qty 1

## 2018-05-20 MED ORDER — SODIUM CHLORIDE 0.9 % IV SOLN
Freq: Once | INTRAVENOUS | Status: AC
  Administered 2018-05-20: 08:00:00 via INTRAVENOUS
  Filled 2018-05-20: qty 250

## 2018-05-20 MED ORDER — PALONOSETRON HCL INJECTION 0.25 MG/5ML
0.2500 mg | Freq: Once | INTRAVENOUS | Status: AC
  Administered 2018-05-20: 0.25 mg via INTRAVENOUS

## 2018-05-20 MED ORDER — SODIUM CHLORIDE 0.9 % IV SOLN: 574.0000 mg | Freq: Once | INTRAVENOUS | Status: DC

## 2018-05-20 NOTE — Patient Instructions (Signed)
[facility] Discharge Instructions for Patients Receiving Chemotherapy  Today you received the following chemotherapy agents keytruda, taxol, carbo.  To help prevent nausea and vomiting after your treatment, we encourage you to take your nausea medication    If you develop nausea and vomiting that is not controlled by your nausea medication, call the clinic.   BELOW ARE SYMPTOMS THAT SHOULD BE REPORTED IMMEDIATELY:  *FEVER GREATER THAN 100.5 F  *CHILLS WITH OR WITHOUT FEVER  NAUSEA AND VOMITING THAT IS NOT CONTROLLED WITH YOUR NAUSEA MEDICATION  *UNUSUAL SHORTNESS OF BREATH  *UNUSUAL BRUISING OR BLEEDING  TENDERNESS IN MOUTH AND THROAT WITH OR WITHOUT PRESENCE OF ULCERS  *URINARY PROBLEMS  *BOWEL PROBLEMS  UNUSUAL RASH Items with * indicate a potential emergency and should be followed up as soon as possible.  Feel free to call the clinic should you have any questions or concerns. The clinic phone number is [phone].  Please show the [facility] at check-in to the Emergency Department and triage nurse.

## 2018-05-21 ENCOUNTER — Ambulatory Visit
Admission: RE | Admit: 2018-05-21 | Discharge: 2018-05-21 | Disposition: A | Discharge status: Home / Self Care | Payer: Medicare Other | Source: Ambulatory Visit | Attending: Radiation Oncology | Admitting: Radiation Oncology

## 2018-05-21 ENCOUNTER — Encounter: Payer: Self-pay | Admitting: Radiation Oncology

## 2018-05-21 DIAGNOSIS — C7802 Secondary malignant neoplasm of left lung: Secondary | ICD-10-CM | POA: Diagnosis not present

## 2018-05-21 DIAGNOSIS — C773 Secondary and unspecified malignant neoplasm of axilla and upper limb lymph nodes: Secondary | ICD-10-CM | POA: Diagnosis not present

## 2018-05-21 DIAGNOSIS — Z87891 Personal history of nicotine dependence: Secondary | ICD-10-CM | POA: Diagnosis not present

## 2018-05-21 DIAGNOSIS — C7801 Secondary malignant neoplasm of right lung: Secondary | ICD-10-CM | POA: Diagnosis not present

## 2018-05-21 DIAGNOSIS — Z510 Encounter for antineoplastic radiation therapy: Secondary | ICD-10-CM | POA: Diagnosis not present

## 2018-05-21 DIAGNOSIS — C3431 Malignant neoplasm of lower lobe, right bronchus or lung: Secondary | ICD-10-CM | POA: Diagnosis not present

## 2018-05-22 ENCOUNTER — Inpatient Hospital Stay: Payer: Medicare Other

## 2018-05-22 VITALS — HR 73 | Temp 98.6°F | Resp 16

## 2018-05-22 DIAGNOSIS — C3491 Malignant neoplasm of unspecified part of right bronchus or lung: Secondary | ICD-10-CM | POA: Diagnosis not present

## 2018-05-22 DIAGNOSIS — C773 Secondary and unspecified malignant neoplasm of axilla and upper limb lymph nodes: Secondary | ICD-10-CM | POA: Diagnosis not present

## 2018-05-22 DIAGNOSIS — Z5111 Encounter for antineoplastic chemotherapy: Secondary | ICD-10-CM | POA: Diagnosis not present

## 2018-05-22 DIAGNOSIS — G620 Drug-induced polyneuropathy: Secondary | ICD-10-CM | POA: Diagnosis not present

## 2018-05-22 DIAGNOSIS — Z5112 Encounter for antineoplastic immunotherapy: Secondary | ICD-10-CM | POA: Diagnosis not present

## 2018-05-22 DIAGNOSIS — M10062 Idiopathic gout, left knee: Secondary | ICD-10-CM | POA: Diagnosis not present

## 2018-05-22 MED ORDER — PEGFILGRASTIM-CBQV 6 MG/0.6ML ~~LOC~~ SOSY
6.0000 mg | PREFILLED_SYRINGE | Freq: Once | SUBCUTANEOUS | Status: AC
  Administered 2018-05-22: 6 mg via SUBCUTANEOUS

## 2018-05-22 MED ORDER — PEGFILGRASTIM-CBQV 6 MG/0.6ML ~~LOC~~ SOSY
PREFILLED_SYRINGE | SUBCUTANEOUS | Status: AC
  Filled 2018-05-22: qty 0.6

## 2018-05-22 NOTE — Patient Instructions (Signed)
Pegfilgrastim injection What is this medicine? PEGFILGRASTIM (PEG fil gra stim) is a long-acting granulocyte colony-stimulating factor that stimulates the growth of neutrophils, a type of white blood cell important in the body's fight against infection. It is used to reduce the incidence of fever and infection in patients with certain types of cancer who are receiving chemotherapy that affects the bone marrow, and to increase survival after being exposed to high doses of radiation. This medicine may be used for other purposes; ask your health care provider or pharmacist if you have questions. COMMON BRAND NAME(S): Neulasta What should I tell my health care provider before I take this medicine? They need to know if you have any of these conditions: -kidney disease -latex allergy -ongoing radiation therapy -sickle cell disease -skin reactions to acrylic adhesives (On-Body Injector only) -an unusual or allergic reaction to pegfilgrastim, filgrastim, other medicines, foods, dyes, or preservatives -pregnant or trying to get pregnant -breast-feeding How should I use this medicine? This medicine is for injection under the skin. If you get this medicine at home, you will be taught how to prepare and give the pre-filled syringe or how to use the On-body Injector. Refer to the patient Instructions for Use for detailed instructions. Use exactly as directed. Tell your healthcare provider immediately if you suspect that the On-body Injector may not have performed as intended or if you suspect the use of the On-body Injector resulted in a missed or partial dose. It is important that you put your used needles and syringes in a special sharps container. Do not put them in a trash can. If you do not have a sharps container, call your pharmacist or healthcare provider to get one. Talk to your pediatrician regarding the use of this medicine in children. While this drug may be prescribed for selected conditions,  precautions do apply. Overdosage: If you think you have taken too much of this medicine contact a poison control center or emergency room at once. NOTE: This medicine is only for you. Do not share this medicine with others. What if I miss a dose? It is important not to miss your dose. Call your doctor or health care professional if you miss your dose. If you miss a dose due to an On-body Injector failure or leakage, a new dose should be administered as soon as possible using a single prefilled syringe for manual use. What may interact with this medicine? Interactions have not been studied. Give your health care provider a list of all the medicines, herbs, non-prescription drugs, or dietary supplements you use. Also tell them if you smoke, drink alcohol, or use illegal drugs. Some items may interact with your medicine. This list may not describe all possible interactions. Give your health care provider a list of all the medicines, herbs, non-prescription drugs, or dietary supplements you use. Also tell them if you smoke, drink alcohol, or use illegal drugs. Some items may interact with your medicine. What should I watch for while using this medicine? You may need blood work done while you are taking this medicine. If you are going to need a MRI, CT scan, or other procedure, tell your doctor that you are using this medicine (On-Body Injector only). What side effects may I notice from receiving this medicine? Side effects that you should report to your doctor or health care professional as soon as possible: -allergic reactions like skin rash, itching or hives, swelling of the face, lips, or tongue -dizziness -fever -pain, redness, or irritation at site   where injected -pinpoint red spots on the skin -red or dark-brown urine -shortness of breath or breathing problems -stomach or side pain, or pain at the shoulder -swelling -tiredness -trouble passing urine or change in the amount of urine Side  effects that usually do not require medical attention (report to your doctor or health care professional if they continue or are bothersome): -bone pain -muscle pain This list may not describe all possible side effects. Call your doctor for medical advice about side effects. You may report side effects to FDA at 1-800-FDA-1088. Where should I keep my medicine? Keep out of the reach of children. Store pre-filled syringes in a refrigerator between 2 and 8 degrees C (36 and 46 degrees F). Do not freeze. Keep in carton to protect from light. Throw away this medicine if it is left out of the refrigerator for more than 48 hours. Throw away any unused medicine after the expiration date. NOTE: This sheet is a summary. It may not cover all possible information. If you have questions about this medicine, talk to your doctor, pharmacist, or health care provider.  2018 Elsevier/Gold Standard (2016-08-23 12:58:03)  

## 2018-05-27 ENCOUNTER — Inpatient Hospital Stay: Payer: Medicare Other

## 2018-05-27 ENCOUNTER — Telehealth: Payer: Self-pay | Admitting: Medical Oncology

## 2018-05-27 DIAGNOSIS — C3491 Malignant neoplasm of unspecified part of right bronchus or lung: Secondary | ICD-10-CM | POA: Diagnosis not present

## 2018-05-27 DIAGNOSIS — C773 Secondary and unspecified malignant neoplasm of axilla and upper limb lymph nodes: Secondary | ICD-10-CM | POA: Diagnosis not present

## 2018-05-27 DIAGNOSIS — M10062 Idiopathic gout, left knee: Secondary | ICD-10-CM | POA: Diagnosis not present

## 2018-05-27 DIAGNOSIS — Z5112 Encounter for antineoplastic immunotherapy: Secondary | ICD-10-CM | POA: Diagnosis not present

## 2018-05-27 DIAGNOSIS — Z5111 Encounter for antineoplastic chemotherapy: Secondary | ICD-10-CM | POA: Diagnosis not present

## 2018-05-27 DIAGNOSIS — G620 Drug-induced polyneuropathy: Secondary | ICD-10-CM | POA: Diagnosis not present

## 2018-05-27 LAB — CMP (CANCER CENTER ONLY)
ALBUMIN: 3.6 g/dL (ref 3.5–5.0)
ALT: 26 U/L (ref 0–44)
ANION GAP: 9 (ref 5–15)
AST: 19 U/L (ref 15–41)
Alkaline Phosphatase: 89 U/L (ref 38–126)
BILIRUBIN TOTAL: 0.4 mg/dL (ref 0.3–1.2)
BUN: 13 mg/dL (ref 8–23)
CO2: 28 mmol/L (ref 22–32)
Calcium: 9.6 mg/dL (ref 8.9–10.3)
Chloride: 106 mmol/L (ref 98–111)
Creatinine: 1.03 mg/dL (ref 0.61–1.24)
GLUCOSE: 101 mg/dL — AB (ref 70–99)
POTASSIUM: 4.4 mmol/L (ref 3.5–5.1)
Sodium: 143 mmol/L (ref 135–145)
TOTAL PROTEIN: 7.7 g/dL (ref 6.5–8.1)

## 2018-05-27 LAB — CBC WITH DIFFERENTIAL (CANCER CENTER ONLY)
BASOS ABS: 0 10*3/uL (ref 0.0–0.1)
Basophils Relative: 0 %
EOS ABS: 0.2 10*3/uL (ref 0.0–0.5)
Eosinophils Relative: 3 %
HCT: 38.3 % — ABNORMAL LOW (ref 38.4–49.9)
Hemoglobin: 12.4 g/dL — ABNORMAL LOW (ref 13.0–17.1)
LYMPHS PCT: 7 %
Lymphs Abs: 0.6 10*3/uL — ABNORMAL LOW (ref 0.9–3.3)
MCH: 29.2 pg (ref 27.2–33.4)
MCHC: 32.4 g/dL (ref 32.0–36.0)
MCV: 90.1 fL (ref 79.3–98.0)
Monocytes Absolute: 1.3 10*3/uL — ABNORMAL HIGH (ref 0.1–0.9)
Monocytes Relative: 17 %
Neutro Abs: 5.7 10*3/uL (ref 1.5–6.5)
Neutrophils Relative %: 73 %
PLATELETS: 145 10*3/uL (ref 140–400)
RBC: 4.25 MIL/uL (ref 4.20–5.82)
RDW: 13.2 % (ref 11.0–14.6)
WBC: 7.8 10*3/uL (ref 4.0–10.3)

## 2018-05-27 NOTE — Progress Notes (Signed)
  Radiation Oncology         [phone]) [phone] ________________________________  Name: Travis Padilla MRN: [id number]  Date: 05/21/2018  DOB: 1945-10-13  End of Treatment Note  Diagnosis:   72 y.o. 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 Right Lung / 37.5 Gy in 15 fractions 2. Left Axilla / 37.5 Gy in 15 fractions  Beams/energy:    1. 3D / 6X, 10X Photon 2. 3D / 6X, 15X Photon  Narrative: The patient tolerated radiation treatment relatively well.  He developed some dysphasia over the course of treatment but denies any shortness of breath, cough, or persistent hemoptysis.  Plan: The patient has completed radiation treatment. The patient will return to radiation oncology clinic for routine followup in one month. I advised them to call or return sooner if they have any questions or concerns related to their recovery or treatment.  ------------------------------------------------  Jodelle Gross, MD, PhD  This document serves as a record of services personally performed by Kyung Rudd, MD. It was created on his behalf by Rae Lips, a trained medical scribe. The creation of this record is based on the scribe's personal observations and the provider's statements to them. This document has been checked and approved by the attending provider.

## 2018-05-27 NOTE — Telephone Encounter (Addendum)
"  skin rash"- reports itchy skin rash on walk-in form -pt asking what caused it. Pt has faint erythema in radiation fields.Skin is dry. I told pt this was normal skin reaction to radiation and instructed him to use lotion ordered by XRT.  Pt tolerated chemo well-no other complaints.

## 2018-05-30 ENCOUNTER — Encounter: Payer: Self-pay | Admitting: Pulmonary Disease

## 2018-05-30 ENCOUNTER — Ambulatory Visit (INDEPENDENT_AMBULATORY_CARE_PROVIDER_SITE_OTHER): Payer: Medicare Other | Admitting: Pulmonary Disease

## 2018-05-30 VITALS — BP 138/86 | HR 80 | Ht 72.0 in | Wt 230.0 lb

## 2018-05-30 DIAGNOSIS — C3491 Malignant neoplasm of unspecified part of right bronchus or lung: Secondary | ICD-10-CM | POA: Diagnosis not present

## 2018-05-30 DIAGNOSIS — R0600 Dyspnea, unspecified: Secondary | ICD-10-CM

## 2018-05-30 LAB — PULMONARY FUNCTION TEST
DL/VA % PRED: 86 %
DL/VA: 4.1 ml/min/mmHg/L
DLCO COR % PRED: 78 %
DLCO COR: 27.59 ml/min/mmHg
DLCO UNC % PRED: 73 %
DLCO unc: 25.71 ml/min/mmHg
FEF 25-75 PRE: 3.29 L/s
FEF 25-75 Post: 2.93 L/sec
FEF2575-%Change-Post: -10 %
FEF2575-%Pred-Post: 111 %
FEF2575-%Pred-Pre: 125 %
FEV1-%Change-Post: -3 %
FEV1-%Pred-Post: 97 %
FEV1-%Pred-Pre: 101 %
FEV1-Post: 3.4 L
FEV1-Pre: 3.52 L
FEV1FVC-%Change-Post: 0 %
FEV1FVC-%Pred-Pre: 109 %
FEV6-%CHANGE-POST: -3 %
FEV6-%PRED-POST: 94 %
FEV6-%PRED-PRE: 98 %
FEV6-PRE: 4.39 L
FEV6-Post: 4.25 L
FEV6FVC-%CHANGE-POST: 0 %
FEV6FVC-%PRED-PRE: 105 %
FEV6FVC-%Pred-Post: 105 %
FVC-%Change-Post: -3 %
FVC-%Pred-Post: 90 %
FVC-%Pred-Pre: 93 %
FVC-Post: 4.26 L
FVC-Pre: 4.4 L
POST FEV6/FVC RATIO: 100 %
PRE FEV1/FVC RATIO: 80 %
Post FEV1/FVC ratio: 80 %
Pre FEV6/FVC Ratio: 100 %

## 2018-05-30 NOTE — Patient Instructions (Signed)
Squamous cell lung cancer: Continue follow-up with the oncology clinic  Today's lung function test did not show evidence of COPD  I think it is a good idea to get a flu shot I see no reason for you to limit your activity so exercise is good  Please come back and see me if you have a change or new respiratory complaints

## 2018-05-30 NOTE — Progress Notes (Signed)
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  Subjective:   PATIENT ID: Travis Padilla GENDER: male DOB: 17-Dec-1945, MRN: [id number]   HPI  Chief Complaint  Patient presents with  . Follow-up    wheezing is gone, bronch results    Travis Padilla says that he has been doing fairly well.  He has been receiving his radiation treatments and has undergone 1 week of chemotherapy.  He says that the wheezing in his chest is improved.  The cough is improved.  He is no longer coughing up blood.   Past Medical History:  Diagnosis Date  . Abnormal nuclear stress test    December, 2013  . Anemia   . CAD (coronary artery disease)    Mild nonobstructive plaque in cath 2013  . Chest pain    December, 2013  . Dizziness   . Gout   . Hemorrhoid   . Hyperlipidemia   . Hypertension   . Skin cancer   . Spinal stenosis         Review of Systems  Constitutional: Negative for fever and weight loss.  HENT: Negative for congestion, ear pain, nosebleeds and sore throat.   Eyes: Negative for redness.  Respiratory: Negative for cough, shortness of breath and wheezing.   Cardiovascular: Negative for palpitations, leg swelling and PND.  Skin: Negative for rash.      Objective:  Physical Exam   Vitals:   05/30/18 1150  BP: 138/86  Pulse: 80  SpO2: 97%  Weight: 230 lb (104.3 kg)  Height: 6' (1.829 m)    Gen: well appearing HENT: OP clear, TM's clear, neck supple PULM: CTA B, normal percussion CV: RRR, no mgr, trace edema GI: BS+, soft, nontender Derm: no cyanosis or rash Psyche: normal mood and affect    CBC    Component Value Date/Time   WBC 7.8 05/27/2018 1143   WBC 6.0 08/21/2012 1514   RBC 4.25 05/27/2018 1143   HGB 12.4 (L) 05/27/2018 1143   HGB 13.0 12/04/2017  1202   HCT 38.3 (L) 05/27/2018 1143   HCT 39.6 12/04/2017 1202   PLT 145 05/27/2018 1143   PLT 218 12/04/2017 1202   MCV 90.1 05/27/2018 1143   MCV 92 12/04/2017 1202   MCH 29.2 05/27/2018 1143   MCHC 32.4 05/27/2018 1143   RDW 13.2 05/27/2018 1143   RDW 13.8 12/04/2017 1202   LYMPHSABS 0.6 (L) 05/27/2018 1143   LYMPHSABS 1.8 12/04/2017 1202   MONOABS 1.3 (H) 05/27/2018 1143   EOSABS 0.2 05/27/2018 1143   EOSABS 0.2 12/04/2017 1202   BASOSABS 0.0 05/27/2018 1143   BASOSABS 0.0 12/04/2017 1202     Chest imaging: 04/2018 CT chest> Lingular nodule, RLL mass, RLL bronchiectasis, no emphysema of fibrosis, images personally reviewed  PFT: Spirometry test at the [facility] clinic in the spring 2019 showed moderate airflow obstruction with a ratio of 68% and FEV1 of 74% predicted Methacholine challenge test in the spring 2019 at [facility] clinic was negative Exhaled nitric oxide testing at [facility] clinic in the spring 2019 was 32 ppm Full pulmonary function testing September 2019 ratio normal, FEV1 3.52 L 101% predicted, DLCO 25.7 173% predicted, total lung capacity 5.56 L 74% predicted  Labs:  Path: 8/20 TTNA: metastatic squamous cell carcinoma 05/09/2018 RLL  bronchial biopsy: squamous cell carcinoma  Echo:  Heart Catheterization:  Records from oncology reviewed where he was seen for a lung mass and referred to IR for a biopsy     Assessment & Plan:   Squamous cell carcinoma of right lung [facility])  Discussion: Travis Padilla does not have COPD.  His symptoms have improved since he has been treated with radiation therapy, so I think that all of his symptoms were due to the burden of disease in the right lower lobe.  Plan: Squamous cell lung cancer: Continue follow-up with the oncology clinic  Today's lung function test did not show evidence of COPD  I think it is a good idea to get a flu shot I see no reason for you to limit your activity so exercise is good  Please come  back and see me if you have a change or new respiratory complaints   Current Outpatient Medications:  .  acetaminophen (TYLENOL) 500 MG tablet, Take 1,000 mg by mouth every 6 (six) hours as needed., Disp: , Rfl:  .  ALPRAZolam (XANAX) 0.25 MG tablet, Take 1 tablet (0.25 mg total) by mouth at bedtime as needed for anxiety., Disp: 30 tablet, Rfl: 0 .  Artificial Tear Solution (SOOTHE XP OP), Place 1 drop into both eyes 2 (two) times daily., Disp: , Rfl:  .  carvedilol (COREG) 12.5 MG tablet, TAKE 1 AND 1/2 TABLETS BY MOUTH TWO TIMES DAILY WITH A MEAL (Patient taking differently: Take 18.75 mg by mouth 2 (two) times daily with a meal. ), Disp: 270 tablet, Rfl: 3 .  Glucosamine-Chondroitin (OSTEO BI-FLEX REGULAR STRENGTH PO), Take 1 tablet by mouth daily., Disp: , Rfl:  .  Multiple Vitamin (MULTIVITAMIN) tablet, Take 1 tablet by mouth daily., Disp: , Rfl:  .  pravastatin (PRAVACHOL) 40 MG tablet, TAKE 1 TABLET BY MOUTH EVERY DAY (Patient taking differently: Take 40 mg by mouth at bedtime. ), Disp: 90 tablet, Rfl: 3 .  prochlorperazine (COMPAZINE) 10 MG tablet, Take 1 tablet (10 mg total) by mouth every 6 (six) hours as needed for nausea or vomiting., Disp: 30 tablet, Rfl: 1 .  Wound Dressings (SONAFINE EX), Apply 1 application topically daily., Disp: , Rfl:  .  albuterol (PROVENTIL) (2.5 MG/3ML) 0.083% nebulizer solution, Take 3 mLs (2.5 mg total) by nebulization every 6 (six) hours as needed for wheezing or shortness of breath. (Patient not taking: Reported on 05/30/2018), Disp: 75 mL, Rfl: 12

## 2018-05-30 NOTE — Progress Notes (Signed)
PFT completed today.  

## 2018-06-03 ENCOUNTER — Inpatient Hospital Stay: Payer: Medicare Other

## 2018-06-03 DIAGNOSIS — G620 Drug-induced polyneuropathy: Secondary | ICD-10-CM | POA: Diagnosis not present

## 2018-06-03 DIAGNOSIS — C773 Secondary and unspecified malignant neoplasm of axilla and upper limb lymph nodes: Secondary | ICD-10-CM | POA: Diagnosis not present

## 2018-06-03 DIAGNOSIS — Z5112 Encounter for antineoplastic immunotherapy: Secondary | ICD-10-CM | POA: Diagnosis not present

## 2018-06-03 DIAGNOSIS — M10062 Idiopathic gout, left knee: Secondary | ICD-10-CM | POA: Diagnosis not present

## 2018-06-03 DIAGNOSIS — Z5111 Encounter for antineoplastic chemotherapy: Secondary | ICD-10-CM

## 2018-06-03 DIAGNOSIS — C3491 Malignant neoplasm of unspecified part of right bronchus or lung: Secondary | ICD-10-CM | POA: Diagnosis not present

## 2018-06-03 LAB — CBC WITH DIFFERENTIAL (CANCER CENTER ONLY)
BASOS PCT: 0 %
Basophils Absolute: 0 10*3/uL (ref 0.0–0.1)
EOS ABS: 0.1 10*3/uL (ref 0.0–0.5)
Eosinophils Relative: 2 %
HCT: 37 % — ABNORMAL LOW (ref 38.4–49.9)
HEMOGLOBIN: 12.1 g/dL — AB (ref 13.0–17.1)
Lymphocytes Relative: 7 %
Lymphs Abs: 0.5 10*3/uL — ABNORMAL LOW (ref 0.9–3.3)
MCH: 29.4 pg (ref 27.2–33.4)
MCHC: 32.7 g/dL (ref 32.0–36.0)
MCV: 90 fL (ref 79.3–98.0)
Monocytes Absolute: 1.2 10*3/uL — ABNORMAL HIGH (ref 0.1–0.9)
Monocytes Relative: 16 %
Neutro Abs: 5.6 10*3/uL (ref 1.5–6.5)
Neutrophils Relative %: 75 %
Platelet Count: 130 10*3/uL — ABNORMAL LOW (ref 140–400)
RBC: 4.11 MIL/uL — AB (ref 4.20–5.82)
RDW: 13.6 % (ref 11.0–14.6)
WBC: 7.4 10*3/uL (ref 4.0–10.3)

## 2018-06-03 LAB — CMP (CANCER CENTER ONLY)
ALK PHOS: 85 U/L (ref 38–126)
ALT: 15 U/L (ref 0–44)
AST: 14 U/L — ABNORMAL LOW (ref 15–41)
Albumin: 3.4 g/dL — ABNORMAL LOW (ref 3.5–5.0)
Anion gap: 7 (ref 5–15)
BILIRUBIN TOTAL: 0.4 mg/dL (ref 0.3–1.2)
BUN: 17 mg/dL (ref 8–23)
CALCIUM: 9.3 mg/dL (ref 8.9–10.3)
CHLORIDE: 107 mmol/L (ref 98–111)
CO2: 28 mmol/L (ref 22–32)
CREATININE: 1.05 mg/dL (ref 0.61–1.24)
Glucose, Bld: 76 mg/dL (ref 70–99)
Potassium: 4.2 mmol/L (ref 3.5–5.1)
Sodium: 142 mmol/L (ref 135–145)
TOTAL PROTEIN: 7.5 g/dL (ref 6.5–8.1)

## 2018-06-06 NOTE — Assessment & Plan Note (Addendum)
This is a very pleasant 72 year old white male with very light most smoking history recently diagnosed with stage IV (T3,N0, M1c)non-small cell lung cancer, squamous cell carcinoma based on the biopsy from the left axillary mass. He is currently on treatment with carboplatin for an AUC of 5, paclitaxel 175 mg meter squared, and Keytruda 200 mg IV every 3 weeks.  Status post 1 cycle.  He tolerated the first cycle well over all with the exception of fatigue.  The patient was seen with Dr. Julien Nordmann.  We again reviewed his diagnosis, prognosis, and treatment options.  We discussed that his disease is not curable, but that we hope to control with treatment.  Recommend for him to proceed with cycle 2 of his treatment today as scheduled.  He will continue to have weekly labs.  Plan is for restaging CT scans after 3 cycles of treatment.  We discussed with the patient and his family that beginning with cycle #5, he will receive Keytruda only every 3 weeks.  The patient will follow-up in 3 weeks for evaluation prior to cycle #3 of his treatment.  For the redness to the face, he was given a prescription for clindamycin gel to be used twice a day as needed.  He was advised to call immediately if he has any concerning symptoms in the interval. The patient voices understanding of current disease status and treatment options and is in agreement with the current care plan.  All questions were answered. The patient knows to call the clinic with any problems, questions or concerns. We can certainly see the patient much sooner if necessary.

## 2018-06-06 NOTE — Progress Notes (Signed)
Travis Padilla OFFICE PROGRESS NOTE  [name], MD [place] [address]  DIAGNOSIS:Stage IV (T3, N0, [facility]) non-small cell lung cancer, squamous cell carcinoma presented with large right infrahilar mass in addition to left upper lobe lung nodule as well as left axillary mass with left axillary lymph node diagnosed in August 2019.  PRIOR THERAPY:Palliative radiotherapy to the right infrahilar mass as well as the axillary mass under the care of Dr. Lisbeth Renshaw.  CURRENT THERAPY: Carboplatin for an AUC of 5, paclitaxel 175 mg/m, and Keytruda 200 mg IV given every 3 weeks.  First dose given on 05/20/2018.  Status post 1 cycle.  INTERVAL HISTORY: Travis Padilla 72 y.o. male returns for a routine follow-up visit accompanied by his wife and son.  The patient reports that he has been having fatigue following his first cycle of chemotherapy but otherwise tolerated it well.  He denies fevers and chills.  Denies chest pain, shortness of breath, cough, hemoptysis.  Denies nausea, vomiting, constipation, diarrhea.  He notes some redness to his face at times.  No rashes noted elsewhere.  He experienced some mild neuropathy in his hands and feet following his first cycle of chemotherapy but that has now resolved.  Denies recent weight loss or night sweats.  The patient is here for evaluation prior to cycle #2 of his treatment.  MEDICAL HISTORY: Past Medical History:  Diagnosis Date  . Abnormal nuclear stress test    December, 2013  . Anemia   . CAD (coronary artery disease)    Mild nonobstructive plaque in cath 2013  . Chest pain    December, 2013  . Dizziness   . Gout   . Hemorrhoid   . Hyperlipidemia   . Hypertension   . Skin cancer   . Spinal stenosis     ALLERGIES:  is allergic to trazodone and nefazodone.  MEDICATIONS:  Current Outpatient Medications  Medication Sig Dispense Refill  . acetaminophen (TYLENOL) 500 MG tablet Take 1,000 mg by mouth every 6 (six)  hours as needed.    . ALPRAZolam (XANAX) 0.25 MG tablet Take 1 tablet (0.25 mg total) by mouth at bedtime as needed for anxiety. 30 tablet 0  . Artificial Tear Solution (SOOTHE XP OP) Place 1 drop into both eyes 2 (two) times daily.    . carvedilol (COREG) 12.5 MG tablet TAKE 1 AND 1/2 TABLETS BY MOUTH TWO TIMES DAILY WITH A MEAL (Patient taking differently: Take 18.75 mg by mouth 2 (two) times daily with a meal. ) 270 tablet 3  . Glucosamine-Chondroitin (OSTEO BI-FLEX REGULAR STRENGTH PO) Take 1 tablet by mouth daily.    . Multiple Vitamin (MULTIVITAMIN) tablet Take 1 tablet by mouth daily.    . pravastatin (PRAVACHOL) 40 MG tablet TAKE 1 TABLET BY MOUTH EVERY DAY (Patient taking differently: Take 40 mg by mouth at bedtime. ) 90 tablet 3  . prochlorperazine (COMPAZINE) 10 MG tablet Take 1 tablet (10 mg total) by mouth every 6 (six) hours as needed for nausea or vomiting. 30 tablet 1  . Wound Dressings (SONAFINE EX) Apply 1 application topically daily.    Marland Kitchen albuterol (PROVENTIL) (2.5 MG/3ML) 0.083% nebulizer solution Take 3 mLs (2.5 mg total) by nebulization every 6 (six) hours as needed for wheezing or shortness of breath. (Patient not taking: Reported on 05/30/2018) 75 mL 12  . clindamycin (CLINDAGEL) 1 % gel Apply topically 2 (two) times daily as needed. 30 g 0   No current facility-administered  medications for this visit.    Facility-Administered Medications Ordered in Other Visits  Medication Dose Route Frequency Provider Last Rate Last Dose  . 0.9 %  sodium chloride infusion   Intravenous Once Curt Bears, MD      . CARBOplatin (PARAPLATIN) 650 mg in sodium chloride 0.9 % 250 mL chemo infusion  650 mg Intravenous Once Curt Bears, MD      . diphenhydrAMINE (BENADRYL) injection 50 mg  50 mg Intravenous Once Curt Bears, MD      . famotidine (PEPCID) IVPB 20 mg premix  20 mg Intravenous Once Curt Bears, MD      . fosaprepitant (EMEND) 150 mg, dexamethasone (DECADRON) 12 mg  in sodium chloride 0.9 % 145 mL IVPB   Intravenous Once Curt Bears, MD      . PACLitaxel (TAXOL) 414 mg in sodium chloride 0.9 % 500 mL chemo infusion (> 80mg /m2)  175 mg/m2 (Treatment Plan Recorded) Intravenous Once Curt Bears, MD      . palonosetron (ALOXI) injection 0.25 mg  0.25 mg Intravenous Once Curt Bears, MD      . pembrolizumab [facility]) 200 mg in sodium chloride 0.9 % 50 mL chemo infusion  200 mg Intravenous Once Curt Bears, MD        SURGICAL HISTORY:  Past Surgical History:  Procedure Laterality Date  . 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 BELPHAROPTOSIS REPAIR Bilateral   . CATARACT EXTRACTION W/ INTRAOCULAR LENS  IMPLANT, BILATERAL    . COLONOSCOPY N/A 11/03/2014   Procedure: COLONOSCOPY;  Surgeon: Rogene Houston, MD;  [facility];  Service: Endoscopy;  Laterality: N/A;  1225  . KNEE CARTILAGE SURGERY Left    Left knee  . SKIN CANCER EXCISION  12/2014, 04/25/15  . VIDEO BRONCHOSCOPY Bilateral 05/09/2018   Procedure: VIDEO BRONCHOSCOPY WITH FLUORO;  Surgeon: Juanito Doom, MD;  Location: [facility];  Service: Cardiopulmonary;  Laterality: Bilateral;    REVIEW OF SYSTEMS:   Review of Systems  Constitutional: Negative for appetite change, chills, fever and unexpected weight change.  Positive for fatigue. HENT:   Negative for mouth sores, nosebleeds, sore throat and trouble swallowing.   Eyes: Negative for eye problems and icterus.  Respiratory: Negative for cough, hemoptysis, shortness of breath and wheezing.   Cardiovascular: Negative for chest pain and leg swelling.  Gastrointestinal: Negative for abdominal pain, constipation, diarrhea, nausea and vomiting.  Genitourinary: Negative for bladder incontinence, difficulty urinating, dysuria, frequency and hematuria.   Musculoskeletal: Negative for back pain, gait problem, neck pain and neck stiffness.  Skin: Positive for redness to his face. Neurological: Negative for dizziness,  extremity weakness, gait problem, headaches, light-headedness and seizures.  Hematological: Negative for adenopathy. Does not bruise/bleed easily.  Psychiatric/Behavioral: Negative for confusion, depression and sleep disturbance. The patient is not nervous/anxious.     PHYSICAL EXAMINATION:  Blood pressure 113/80, pulse 76, temperature 99 F (37.2 C), temperature source Oral, resp. rate 14, height 6' (1.829 m), weight 228 lb (103.4 kg), SpO2 98 %.  ECOG PERFORMANCE STATUS: 1 - Symptomatic but completely ambulatory  Physical Exam  Constitutional: Oriented to person, place, and time and well-developed, well-nourished, and in no distress. No distress.  HENT:  Head: Normocephalic and atraumatic.  Mouth/Throat: Oropharynx is clear and moist. No oropharyngeal exudate.  Eyes: Conjunctivae are normal. Right eye exhibits no discharge. Left eye exhibits no discharge. No scleral icterus.  Neck: Normal range of motion. Neck supple.  Cardiovascular: Normal rate, regular rhythm, normal heart  sounds and intact distal pulses.   Pulmonary/Chest: Effort normal and breath sounds normal. No respiratory distress. No wheezes. No rales.  Abdominal: Soft. Bowel sounds are normal. Exhibits no distension and no mass. There is no tenderness.  Musculoskeletal: Normal range of motion. Exhibits no edema.  Lymphadenopathy:    No cervical adenopathy.  Neurological: Alert and oriented to person, place, and time. Exhibits normal muscle tone. Gait normal. Coordination normal.  Skin: No rashes.  Mild redness noticed to his left cheek and nose. Psychiatric: Mood, memory and judgment normal.  Vitals reviewed.  LABORATORY DATA: Lab Results  Component Value Date   WBC 5.2 06/09/2018   HGB 11.5 (L) 06/09/2018   HCT 34.4 (L) 06/09/2018   MCV 87.6 06/09/2018   PLT 210 06/09/2018      Chemistry      Component Value Date/Time   NA 142 06/09/2018 0949   NA 144 04/09/2018 1507   K 4.6 06/09/2018 0949   CL 109  06/09/2018 0949   CO2 24 06/09/2018 0949   BUN 15 06/09/2018 0949   BUN 15 04/09/2018 1507   CREATININE 0.89 06/09/2018 0949      Component Value Date/Time   CALCIUM 9.4 06/09/2018 0949   ALKPHOS 76 06/09/2018 0949   AST 12 (L) 06/09/2018 0949   ALT 13 06/09/2018 0949   BILITOT 0.3 06/09/2018 0949       RADIOGRAPHIC STUDIES:  No results found.   ASSESSMENT/PLAN:  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[place]) This is a very pleasant 72 year old white male with very light most smoking history recently diagnosed with stage IV (T3,N0, M1c)non-small cell lung cancer, squamous cell carcinoma based on the biopsy from the left axillary mass. He is currently on treatment with carboplatin for an AUC of 5, paclitaxel 175 mg meter squared, and Keytruda 200 mg IV every 3 weeks.  Status post 1 cycle.  He tolerated the first cycle well over all with the exception of fatigue.  The patient was seen with Dr. Julien Nordmann.  We again reviewed his diagnosis, prognosis, and treatment options.  We discussed that his disease is not curable, but that we hope to control with treatment.  Recommend for him to proceed with cycle 2 of his treatment today as scheduled.  He will continue to have weekly labs.  Plan is for restaging CT scans after 3 cycles of treatment.  We discussed with the patient and his family that beginning with cycle #5, he will receive Keytruda only every 3 weeks.  The patient will follow-up in 3 weeks for evaluation prior to cycle #3 of his treatment.  For the redness to the face, he was given a prescription for clindamycin gel to be used twice a day as needed.  He was advised to call immediately if he has any concerning symptoms in the interval. The patient voices understanding of current disease status and treatment options and is in agreement with the current care plan.  All questions were answered. The patient knows to call the clinic with any problems, questions or concerns. We  can certainly see the patient much sooner if necessary.   No orders of the defined types were placed in this encounter.    Mikey Bussing, DNP, AGPCNP-BC, AOCNP 06/09/18   ADDENDUM: Hematology/Oncology Attending: I had a face-to-face encounter with the patient.  I recommended his care plan.  This is a very pleasant 72 years old white male recently diagnosed with a stage IV non-small cell lung cancer,  squamous cell carcinoma status post palliative radiotherapy to the left axillary mass as well as the right suprahilar mass.  He is currently undergoing systemic chemotherapy with carboplatin, paclitaxel and Keytruda status post 1 cycle.  He tolerated the first cycle of his treatment well with no concerning complaints except for mild fatigue and mild peripheral neuropathy. I had a lengthy discussion with the patient and his family again about his current disease status and prognosis as well as his current treatment option. I recommended for the patient to proceed with cycle #2 today as a schedule. I will see him back for follow-up visit in 3 weeks for evaluation before the next cycle of his treatment. He was advised to call immediately if he has any concerning symptoms in the interval. Disclaimer: This note was dictated with voice recognition software. Similar sounding words can inadvertently be transcribed and may be missed upon review. Eilleen Kempf, MD 06/09/18

## 2018-06-09 ENCOUNTER — Telehealth: Payer: Self-pay | Admitting: Oncology

## 2018-06-09 ENCOUNTER — Inpatient Hospital Stay (HOSPITAL_BASED_OUTPATIENT_CLINIC_OR_DEPARTMENT_OTHER): Payer: Medicare Other | Admitting: Oncology

## 2018-06-09 ENCOUNTER — Inpatient Hospital Stay: Payer: Medicare Other

## 2018-06-09 ENCOUNTER — Other Ambulatory Visit: Payer: Self-pay

## 2018-06-09 ENCOUNTER — Encounter: Payer: Self-pay | Admitting: Oncology

## 2018-06-09 VITALS — BP 113/80 | HR 76 | Temp 99.0°F | Resp 14 | Ht 72.0 in | Wt 228.0 lb

## 2018-06-09 DIAGNOSIS — C3491 Malignant neoplasm of unspecified part of right bronchus or lung: Secondary | ICD-10-CM | POA: Diagnosis not present

## 2018-06-09 DIAGNOSIS — G620 Drug-induced polyneuropathy: Secondary | ICD-10-CM

## 2018-06-09 DIAGNOSIS — C773 Secondary and unspecified malignant neoplasm of axilla and upper limb lymph nodes: Secondary | ICD-10-CM | POA: Diagnosis not present

## 2018-06-09 DIAGNOSIS — R5383 Other fatigue: Secondary | ICD-10-CM

## 2018-06-09 DIAGNOSIS — Z5112 Encounter for antineoplastic immunotherapy: Secondary | ICD-10-CM | POA: Diagnosis not present

## 2018-06-09 DIAGNOSIS — R238 Other skin changes: Secondary | ICD-10-CM | POA: Diagnosis not present

## 2018-06-09 DIAGNOSIS — Z5111 Encounter for antineoplastic chemotherapy: Secondary | ICD-10-CM

## 2018-06-09 DIAGNOSIS — R918 Other nonspecific abnormal finding of lung field: Secondary | ICD-10-CM | POA: Diagnosis not present

## 2018-06-09 DIAGNOSIS — M10062 Idiopathic gout, left knee: Secondary | ICD-10-CM | POA: Diagnosis not present

## 2018-06-09 DIAGNOSIS — Z87891 Personal history of nicotine dependence: Secondary | ICD-10-CM

## 2018-06-09 DIAGNOSIS — Z923 Personal history of irradiation: Secondary | ICD-10-CM

## 2018-06-09 LAB — CBC WITH DIFFERENTIAL (CANCER CENTER ONLY)
BASOS ABS: 0.1 10*3/uL (ref 0.0–0.1)
BASOS PCT: 1 %
EOS ABS: 0 10*3/uL (ref 0.0–0.5)
EOS PCT: 1 %
HCT: 34.4 % — ABNORMAL LOW (ref 38.4–49.9)
Hemoglobin: 11.5 g/dL — ABNORMAL LOW (ref 13.0–17.1)
LYMPHS PCT: 10 %
Lymphs Abs: 0.5 10*3/uL — ABNORMAL LOW (ref 0.9–3.3)
MCH: 29.4 pg (ref 27.2–33.4)
MCHC: 33.5 g/dL (ref 32.0–36.0)
MCV: 87.6 fL (ref 79.3–98.0)
Monocytes Absolute: 1.2 10*3/uL — ABNORMAL HIGH (ref 0.1–0.9)
Monocytes Relative: 23 %
Neutro Abs: 3.4 10*3/uL (ref 1.5–6.5)
Neutrophils Relative %: 65 %
PLATELETS: 210 10*3/uL (ref 140–400)
RBC: 3.93 MIL/uL — AB (ref 4.20–5.82)
RDW: 14 % (ref 11.0–14.6)
WBC: 5.2 10*3/uL (ref 4.0–10.3)

## 2018-06-09 LAB — CMP (CANCER CENTER ONLY)
ALT: 13 U/L (ref 0–44)
AST: 12 U/L — ABNORMAL LOW (ref 15–41)
Albumin: 3.4 g/dL — ABNORMAL LOW (ref 3.5–5.0)
Alkaline Phosphatase: 76 U/L (ref 38–126)
Anion gap: 9 (ref 5–15)
BILIRUBIN TOTAL: 0.3 mg/dL (ref 0.3–1.2)
BUN: 15 mg/dL (ref 8–23)
CO2: 24 mmol/L (ref 22–32)
Calcium: 9.4 mg/dL (ref 8.9–10.3)
Chloride: 109 mmol/L (ref 98–111)
Creatinine: 0.89 mg/dL (ref 0.61–1.24)
GFR, Est AFR Am: >60 mL/min (ref >60)
Glucose, Bld: 110 mg/dL — ABNORMAL HIGH (ref 70–99)
POTASSIUM: 4.6 mmol/L (ref 3.5–5.1)
Sodium: 142 mmol/L (ref 135–145)
TOTAL PROTEIN: 7.7 g/dL (ref 6.5–8.1)

## 2018-06-09 LAB — TSH: TSH: 0.256 u[IU]/mL — ABNORMAL LOW (ref 0.320–4.118)

## 2018-06-09 MED ORDER — SODIUM CHLORIDE 0.9 % IV SOLN
Freq: Once | INTRAVENOUS | Status: AC
  Administered 2018-06-09: 12:00:00 via INTRAVENOUS
  Filled 2018-06-09: qty 250

## 2018-06-09 MED ORDER — SODIUM CHLORIDE 0.9 % IV SOLN
175.0000 mg/m2 | Freq: Once | INTRAVENOUS | Status: AC
  Administered 2018-06-09: 414 mg via INTRAVENOUS
  Filled 2018-06-09: qty 69

## 2018-06-09 MED ORDER — PALONOSETRON HCL INJECTION 0.25 MG/5ML
INTRAVENOUS | Status: AC
  Filled 2018-06-09: qty 5

## 2018-06-09 MED ORDER — FAMOTIDINE IN NACL 20-0.9 MG/50ML-% IV SOLN
INTRAVENOUS | Status: AC
  Filled 2018-06-09: qty 50

## 2018-06-09 MED ORDER — DIPHENHYDRAMINE HCL 50 MG/ML IJ SOLN
50.0000 mg | Freq: Once | INTRAMUSCULAR | Status: AC
  Administered 2018-06-09: 50 mg via INTRAVENOUS

## 2018-06-09 MED ORDER — FAMOTIDINE IN NACL 20-0.9 MG/50ML-% IV SOLN
20.0000 mg | Freq: Once | INTRAVENOUS | Status: AC
  Administered 2018-06-09: 20 mg via INTRAVENOUS

## 2018-06-09 MED ORDER — SODIUM CHLORIDE 0.9 % IV SOLN
Freq: Once | INTRAVENOUS | Status: AC
  Administered 2018-06-09: 13:00:00 via INTRAVENOUS
  Filled 2018-06-09: qty 5

## 2018-06-09 MED ORDER — CLINDAMYCIN PHOSPHATE 1 % EX GEL: Freq: Two times a day (BID) | CUTANEOUS | 0 refills | Status: DC | PRN

## 2018-06-09 MED ORDER — SODIUM CHLORIDE 0.9 % IV SOLN
200.0000 mg | Freq: Once | INTRAVENOUS | Status: AC
  Administered 2018-06-09: 200 mg via INTRAVENOUS
  Filled 2018-06-09: qty 8

## 2018-06-09 MED ORDER — SODIUM CHLORIDE 0.9 % IV SOLN
645.5000 mg | Freq: Once | INTRAVENOUS | Status: AC
  Administered 2018-06-09: 650 mg via INTRAVENOUS
  Filled 2018-06-09: qty 65

## 2018-06-09 MED ORDER — DIPHENHYDRAMINE HCL 50 MG/ML IJ SOLN
INTRAMUSCULAR | Status: AC
  Filled 2018-06-09: qty 1

## 2018-06-09 MED ORDER — PALONOSETRON HCL INJECTION 0.25 MG/5ML
0.2500 mg | Freq: Once | INTRAVENOUS | Status: AC
  Administered 2018-06-09: 0.25 mg via INTRAVENOUS

## 2018-06-09 NOTE — Telephone Encounter (Signed)
Next cycle already scheduled per 9/30 los.

## 2018-06-09 NOTE — Patient Instructions (Signed)
[facility] Discharge Instructions for Patients Receiving Chemotherapy  Today you received the following chemotherapy agents: Keytruda, Taxol, Carboplatin  To help prevent nausea and vomiting after your treatment, we encourage you to take your nausea medication as directed.   If you develop nausea and vomiting that is not controlled by your nausea medication, call the clinic.   BELOW ARE SYMPTOMS THAT SHOULD BE REPORTED IMMEDIATELY:  *FEVER GREATER THAN 100.5 F  *CHILLS WITH OR WITHOUT FEVER  NAUSEA AND VOMITING THAT IS NOT CONTROLLED WITH YOUR NAUSEA MEDICATION  *UNUSUAL SHORTNESS OF BREATH  *UNUSUAL BRUISING OR BLEEDING  TENDERNESS IN MOUTH AND THROAT WITH OR WITHOUT PRESENCE OF ULCERS  *URINARY PROBLEMS  *BOWEL PROBLEMS  UNUSUAL RASH Items with * indicate a potential emergency and should be followed up as soon as possible.  Feel free to call the clinic should you have any questions or concerns. The clinic phone number is [phone].  Please show the CHEMO ALERT CARD at check-in to the Emergency Department and triage nurse.   

## 2018-06-11 ENCOUNTER — Telehealth: Payer: Self-pay | Admitting: Medical Oncology

## 2018-06-11 ENCOUNTER — Inpatient Hospital Stay: Payer: Medicare Other | Attending: Internal Medicine

## 2018-06-11 DIAGNOSIS — M2550 Pain in unspecified joint: Secondary | ICD-10-CM | POA: Insufficient documentation

## 2018-06-11 DIAGNOSIS — G4700 Insomnia, unspecified: Secondary | ICD-10-CM | POA: Insufficient documentation

## 2018-06-11 DIAGNOSIS — H60502 Unspecified acute noninfective otitis externa, left ear: Secondary | ICD-10-CM | POA: Diagnosis not present

## 2018-06-11 DIAGNOSIS — C773 Secondary and unspecified malignant neoplasm of axilla and upper limb lymph nodes: Secondary | ICD-10-CM | POA: Insufficient documentation

## 2018-06-11 DIAGNOSIS — K649 Unspecified hemorrhoids: Secondary | ICD-10-CM | POA: Diagnosis not present

## 2018-06-11 DIAGNOSIS — C3491 Malignant neoplasm of unspecified part of right bronchus or lung: Secondary | ICD-10-CM | POA: Diagnosis not present

## 2018-06-11 DIAGNOSIS — Z85828 Personal history of other malignant neoplasm of skin: Secondary | ICD-10-CM | POA: Insufficient documentation

## 2018-06-11 DIAGNOSIS — I10 Essential (primary) hypertension: Secondary | ICD-10-CM | POA: Insufficient documentation

## 2018-06-11 DIAGNOSIS — Z5111 Encounter for antineoplastic chemotherapy: Secondary | ICD-10-CM | POA: Diagnosis not present

## 2018-06-11 DIAGNOSIS — R5383 Other fatigue: Secondary | ICD-10-CM | POA: Diagnosis not present

## 2018-06-11 DIAGNOSIS — Z23 Encounter for immunization: Secondary | ICD-10-CM | POA: Diagnosis not present

## 2018-06-11 DIAGNOSIS — K5900 Constipation, unspecified: Secondary | ICD-10-CM | POA: Diagnosis not present

## 2018-06-11 DIAGNOSIS — Z87891 Personal history of nicotine dependence: Secondary | ICD-10-CM | POA: Diagnosis not present

## 2018-06-11 DIAGNOSIS — Z923 Personal history of irradiation: Secondary | ICD-10-CM | POA: Diagnosis not present

## 2018-06-11 DIAGNOSIS — Z7689 Persons encountering health services in other specified circumstances: Secondary | ICD-10-CM | POA: Insufficient documentation

## 2018-06-11 DIAGNOSIS — R630 Anorexia: Secondary | ICD-10-CM | POA: Insufficient documentation

## 2018-06-11 DIAGNOSIS — E785 Hyperlipidemia, unspecified: Secondary | ICD-10-CM | POA: Diagnosis not present

## 2018-06-11 DIAGNOSIS — Z79899 Other long term (current) drug therapy: Secondary | ICD-10-CM | POA: Diagnosis not present

## 2018-06-11 DIAGNOSIS — I2510 Atherosclerotic heart disease of native coronary artery without angina pectoris: Secondary | ICD-10-CM | POA: Insufficient documentation

## 2018-06-11 DIAGNOSIS — Z5112 Encounter for antineoplastic immunotherapy: Secondary | ICD-10-CM | POA: Diagnosis not present

## 2018-06-11 MED ORDER — PEGFILGRASTIM-CBQV 6 MG/0.6ML ~~LOC~~ SOSY
6.0000 mg | PREFILLED_SYRINGE | Freq: Once | SUBCUTANEOUS | Status: AC
  Administered 2018-06-11: 6 mg via SUBCUTANEOUS

## 2018-06-11 NOTE — Telephone Encounter (Signed)
temp 97.7 / Pain big toe "arthritis vs gout flare up"-concerned with low temp -denies chills, weakness. He does report increasing pain in  his big toe -hx gout. He is drinking  tart cherry juice and asking if he can take indocin to prevent flare up like indocin.

## 2018-06-11 NOTE — Patient Instructions (Signed)
Pegfilgrastim injection What is this medicine? PEGFILGRASTIM (PEG fil gra stim) is a long-acting granulocyte colony-stimulating factor that stimulates the growth of neutrophils, a type of white blood cell important in the body's fight against infection. It is used to reduce the incidence of fever and infection in patients with certain types of cancer who are receiving chemotherapy that affects the bone marrow, and to increase survival after being exposed to high doses of radiation. This medicine may be used for other purposes; ask your health care provider or pharmacist if you have questions. COMMON BRAND NAME(S): Neulasta What should I tell my health care provider before I take this medicine? They need to know if you have any of these conditions: -kidney disease -latex allergy -ongoing radiation therapy -sickle cell disease -skin reactions to acrylic adhesives (On-Body Injector only) -an unusual or allergic reaction to pegfilgrastim, filgrastim, other medicines, foods, dyes, or preservatives -pregnant or trying to get pregnant -breast-feeding How should I use this medicine? This medicine is for injection under the skin. If you get this medicine at home, you will be taught how to prepare and give the pre-filled syringe or how to use the On-body Injector. Refer to the patient Instructions for Use for detailed instructions. Use exactly as directed. Tell your healthcare provider immediately if you suspect that the On-body Injector may not have performed as intended or if you suspect the use of the On-body Injector resulted in a missed or partial dose. It is important that you put your used needles and syringes in a special sharps container. Do not put them in a trash can. If you do not have a sharps container, call your pharmacist or healthcare provider to get one. Talk to your pediatrician regarding the use of this medicine in children. While this drug may be prescribed for selected conditions,  precautions do apply. Overdosage: If you think you have taken too much of this medicine contact a poison control center or emergency room at once. NOTE: This medicine is only for you. Do not share this medicine with others. What if I miss a dose? It is important not to miss your dose. Call your doctor or health care professional if you miss your dose. If you miss a dose due to an On-body Injector failure or leakage, a new dose should be administered as soon as possible using a single prefilled syringe for manual use. What may interact with this medicine? Interactions have not been studied. Give your health care provider a list of all the medicines, herbs, non-prescription drugs, or dietary supplements you use. Also tell them if you smoke, drink alcohol, or use illegal drugs. Some items may interact with your medicine. This list may not describe all possible interactions. Give your health care provider a list of all the medicines, herbs, non-prescription drugs, or dietary supplements you use. Also tell them if you smoke, drink alcohol, or use illegal drugs. Some items may interact with your medicine. What should I watch for while using this medicine? You may need blood work done while you are taking this medicine. If you are going to need a MRI, CT scan, or other procedure, tell your doctor that you are using this medicine (On-Body Injector only). What side effects may I notice from receiving this medicine? Side effects that you should report to your doctor or health care professional as soon as possible: -allergic reactions like skin rash, itching or hives, swelling of the face, lips, or tongue -dizziness -fever -pain, redness, or irritation at site   where injected -pinpoint red spots on the skin -red or dark-brown urine -shortness of breath or breathing problems -stomach or side pain, or pain at the shoulder -swelling -tiredness -trouble passing urine or change in the amount of urine Side  effects that usually do not require medical attention (report to your doctor or health care professional if they continue or are bothersome): -bone pain -muscle pain This list may not describe all possible side effects. Call your doctor for medical advice about side effects. You may report side effects to FDA at 1-800-FDA-1088. Where should I keep my medicine? Keep out of the reach of children. Store pre-filled syringes in a refrigerator between 2 and 8 degrees C (36 and 46 degrees F). Do not freeze. Keep in carton to protect from light. Throw away this medicine if it is left out of the refrigerator for more than 48 hours. Throw away any unused medicine after the expiration date. NOTE: This sheet is a summary. It may not cover all possible information. If you have questions about this medicine, talk to your doctor, pharmacist, or health care provider.  2018 Elsevier/Gold Standard (2016-08-23 12:58:03)  

## 2018-06-11 NOTE — Progress Notes (Signed)
Pt instructed that Dr. Julien Nordmann stated he could began taking his Indocin per [facility] instructions

## 2018-06-16 ENCOUNTER — Inpatient Hospital Stay: Payer: Medicare Other

## 2018-06-16 DIAGNOSIS — Z5111 Encounter for antineoplastic chemotherapy: Secondary | ICD-10-CM | POA: Diagnosis not present

## 2018-06-16 DIAGNOSIS — C773 Secondary and unspecified malignant neoplasm of axilla and upper limb lymph nodes: Secondary | ICD-10-CM | POA: Diagnosis not present

## 2018-06-16 DIAGNOSIS — C3491 Malignant neoplasm of unspecified part of right bronchus or lung: Secondary | ICD-10-CM | POA: Diagnosis not present

## 2018-06-16 DIAGNOSIS — Z5112 Encounter for antineoplastic immunotherapy: Secondary | ICD-10-CM | POA: Diagnosis not present

## 2018-06-16 DIAGNOSIS — Z923 Personal history of irradiation: Secondary | ICD-10-CM | POA: Diagnosis not present

## 2018-06-16 DIAGNOSIS — Z7689 Persons encountering health services in other specified circumstances: Secondary | ICD-10-CM | POA: Diagnosis not present

## 2018-06-16 LAB — CBC WITH DIFFERENTIAL (CANCER CENTER ONLY)
BASOS ABS: 0.1 10*3/uL (ref 0.0–0.1)
Basophils Relative: 1 %
EOS PCT: 2 %
Eosinophils Absolute: 0.2 10*3/uL (ref 0.0–0.5)
HCT: 34.4 % — ABNORMAL LOW (ref 38.4–49.9)
Hemoglobin: 11.5 g/dL — ABNORMAL LOW (ref 13.0–17.1)
Lymphocytes Relative: 8 %
Lymphs Abs: 0.6 10*3/uL — ABNORMAL LOW (ref 0.9–3.3)
MCH: 29.3 pg (ref 27.2–33.4)
MCHC: 33.4 g/dL (ref 32.0–36.0)
MCV: 87.9 fL (ref 79.3–98.0)
Monocytes Absolute: 0.8 10*3/uL (ref 0.1–0.9)
Monocytes Relative: 11 %
Neutro Abs: 5.8 10*3/uL (ref 1.5–6.5)
Neutrophils Relative %: 78 %
PLATELETS: 179 10*3/uL (ref 140–400)
RBC: 3.91 MIL/uL — AB (ref 4.20–5.82)
RDW: 14.3 % (ref 11.0–14.6)
WBC Count: 7.5 10*3/uL (ref 4.0–10.3)

## 2018-06-16 LAB — CMP (CANCER CENTER ONLY)
ALT: 33 U/L (ref 0–44)
AST: 26 U/L (ref 15–41)
Albumin: 3.7 g/dL (ref 3.5–5.0)
Alkaline Phosphatase: 95 U/L (ref 38–126)
Anion gap: 6 (ref 5–15)
BUN: 17 mg/dL (ref 8–23)
CO2: 29 mmol/L (ref 22–32)
CREATININE: 0.9 mg/dL (ref 0.61–1.24)
Calcium: 9.6 mg/dL (ref 8.9–10.3)
Chloride: 105 mmol/L (ref 98–111)
GFR, Est AFR Am: >60 mL/min (ref >60)
GLUCOSE: 105 mg/dL — AB (ref 70–99)
Potassium: 4.9 mmol/L (ref 3.5–5.1)
Sodium: 140 mmol/L (ref 135–145)
Total Bilirubin: 0.4 mg/dL (ref 0.3–1.2)
Total Protein: 7.7 g/dL (ref 6.5–8.1)

## 2018-06-17 ENCOUNTER — Inpatient Hospital Stay: Payer: Medicare Other

## 2018-06-19 ENCOUNTER — Telehealth: Payer: Self-pay

## 2018-06-19 NOTE — Telephone Encounter (Signed)
Patient called and requested to move labs to up by 1 day. Per 10/10 phone que

## 2018-06-22 ENCOUNTER — Telehealth: Payer: Self-pay | Admitting: Radiation Oncology

## 2018-06-22 NOTE — Telephone Encounter (Signed)
I spoke with the patient and we discussed his questions by phone. At the conclusion of our discussion he continues to have some trouble with night time sweats but denies any fevers as he checks his temps. A few weeks ago he had a temp of 96 degrees. Otherwise he denies progressive SOB or purulent productive mucous. No other complaints are verbalize. He was given precautions to call if he has concerns prior to his next visit with Dr. Julien Nordmann, otherwise we will see him as needed, and I've cancelled his appt for tomorrow.

## 2018-06-23 ENCOUNTER — Other Ambulatory Visit: Payer: Self-pay | Admitting: *Deleted

## 2018-06-23 ENCOUNTER — Ambulatory Visit: Payer: Self-pay | Admitting: Radiation Oncology

## 2018-06-23 ENCOUNTER — Inpatient Hospital Stay: Payer: Medicare Other

## 2018-06-23 ENCOUNTER — Other Ambulatory Visit: Payer: Self-pay | Admitting: Medical Oncology

## 2018-06-23 DIAGNOSIS — C773 Secondary and unspecified malignant neoplasm of axilla and upper limb lymph nodes: Secondary | ICD-10-CM | POA: Diagnosis not present

## 2018-06-23 DIAGNOSIS — Z923 Personal history of irradiation: Secondary | ICD-10-CM | POA: Diagnosis not present

## 2018-06-23 DIAGNOSIS — C3491 Malignant neoplasm of unspecified part of right bronchus or lung: Secondary | ICD-10-CM | POA: Diagnosis not present

## 2018-06-23 DIAGNOSIS — Z5111 Encounter for antineoplastic chemotherapy: Secondary | ICD-10-CM | POA: Diagnosis not present

## 2018-06-23 DIAGNOSIS — Z5112 Encounter for antineoplastic immunotherapy: Secondary | ICD-10-CM | POA: Diagnosis not present

## 2018-06-23 DIAGNOSIS — Z7689 Persons encountering health services in other specified circumstances: Secondary | ICD-10-CM | POA: Diagnosis not present

## 2018-06-23 DIAGNOSIS — Z8739 Personal history of other diseases of the musculoskeletal system and connective tissue: Secondary | ICD-10-CM

## 2018-06-23 LAB — CBC WITH DIFFERENTIAL (CANCER CENTER ONLY)
Abs Immature Granulocytes: 0.08 10*3/uL — ABNORMAL HIGH (ref 0.00–0.07)
BASOS ABS: 0 10*3/uL (ref 0.0–0.1)
BASOS PCT: 0 %
EOS PCT: 1 %
Eosinophils Absolute: 0.2 10*3/uL (ref 0.0–0.5)
HCT: 34.9 % — ABNORMAL LOW (ref 39.0–52.0)
HEMOGLOBIN: 11.4 g/dL — AB (ref 13.0–17.0)
Immature Granulocytes: 1 %
LYMPHS PCT: 6 %
Lymphs Abs: 0.6 10*3/uL — ABNORMAL LOW (ref 0.7–4.0)
MCH: 29.5 pg (ref 26.0–34.0)
MCHC: 32.7 g/dL (ref 30.0–36.0)
MCV: 90.2 fL (ref 80.0–100.0)
MONO ABS: 1.1 10*3/uL — AB (ref 0.1–1.0)
Monocytes Relative: 10 %
NEUTROS ABS: 9.3 10*3/uL — AB (ref 1.7–7.7)
Neutrophils Relative %: 82 %
PLATELETS: 157 10*3/uL (ref 150–400)
RBC: 3.87 MIL/uL — AB (ref 4.22–5.81)
RDW: 14.3 % (ref 11.5–15.5)
WBC: 11.3 10*3/uL — AB (ref 4.0–10.5)
nRBC: 0 % (ref 0.0–0.2)

## 2018-06-23 LAB — COMPREHENSIVE METABOLIC PANEL
ALBUMIN: 3.9 g/dL (ref 3.5–5.0)
ALT: 22 U/L (ref 0–44)
ANION GAP: 7 (ref 5–15)
AST: 19 U/L (ref 15–41)
Alkaline Phosphatase: 74 U/L (ref 38–126)
BUN: 15 mg/dL (ref 8–23)
CHLORIDE: 108 mmol/L (ref 98–111)
CO2: 28 mmol/L (ref 22–32)
Calcium: 9.5 mg/dL (ref 8.9–10.3)
Creatinine, Ser: 0.8 mg/dL (ref 0.61–1.24)
GFR calc non Af Amer: >60 mL/min (ref >60)
GLUCOSE: 111 mg/dL — AB (ref 70–99)
POTASSIUM: 4.8 mmol/L (ref 3.5–5.1)
SODIUM: 143 mmol/L (ref 135–145)
Total Bilirubin: 0.5 mg/dL (ref 0.3–1.2)
Total Protein: 7.8 g/dL (ref 6.5–8.1)

## 2018-06-23 LAB — URIC ACID: Uric Acid, Serum: 7.1 mg/dL (ref 3.7–8.6)

## 2018-06-24 ENCOUNTER — Other Ambulatory Visit: Payer: Medicare Other

## 2018-07-01 ENCOUNTER — Other Ambulatory Visit: Payer: Self-pay | Admitting: Internal Medicine

## 2018-07-01 ENCOUNTER — Telehealth: Payer: Self-pay

## 2018-07-01 ENCOUNTER — Encounter: Payer: Self-pay | Admitting: Internal Medicine

## 2018-07-01 ENCOUNTER — Inpatient Hospital Stay (HOSPITAL_BASED_OUTPATIENT_CLINIC_OR_DEPARTMENT_OTHER): Payer: Medicare Other | Admitting: Internal Medicine

## 2018-07-01 ENCOUNTER — Inpatient Hospital Stay: Payer: Medicare Other

## 2018-07-01 VITALS — BP 116/75 | HR 82 | Temp 98.3°F | Resp 20 | Ht 73.0 in | Wt 225.5 lb

## 2018-07-01 DIAGNOSIS — Z5112 Encounter for antineoplastic immunotherapy: Secondary | ICD-10-CM

## 2018-07-01 DIAGNOSIS — C3491 Malignant neoplasm of unspecified part of right bronchus or lung: Secondary | ICD-10-CM | POA: Diagnosis not present

## 2018-07-01 DIAGNOSIS — C7801 Secondary malignant neoplasm of right lung: Secondary | ICD-10-CM

## 2018-07-01 DIAGNOSIS — M2550 Pain in unspecified joint: Secondary | ICD-10-CM | POA: Diagnosis not present

## 2018-07-01 DIAGNOSIS — I2510 Atherosclerotic heart disease of native coronary artery without angina pectoris: Secondary | ICD-10-CM

## 2018-07-01 DIAGNOSIS — Z79899 Other long term (current) drug therapy: Secondary | ICD-10-CM

## 2018-07-01 DIAGNOSIS — C773 Secondary and unspecified malignant neoplasm of axilla and upper limb lymph nodes: Secondary | ICD-10-CM | POA: Diagnosis not present

## 2018-07-01 DIAGNOSIS — Z7689 Persons encountering health services in other specified circumstances: Secondary | ICD-10-CM | POA: Diagnosis not present

## 2018-07-01 DIAGNOSIS — Z923 Personal history of irradiation: Secondary | ICD-10-CM

## 2018-07-01 DIAGNOSIS — C3490 Malignant neoplasm of unspecified part of unspecified bronchus or lung: Secondary | ICD-10-CM

## 2018-07-01 DIAGNOSIS — Z5111 Encounter for antineoplastic chemotherapy: Secondary | ICD-10-CM

## 2018-07-01 DIAGNOSIS — Z85828 Personal history of other malignant neoplasm of skin: Secondary | ICD-10-CM

## 2018-07-01 DIAGNOSIS — R5383 Other fatigue: Secondary | ICD-10-CM | POA: Diagnosis not present

## 2018-07-01 DIAGNOSIS — E785 Hyperlipidemia, unspecified: Secondary | ICD-10-CM

## 2018-07-01 DIAGNOSIS — I10 Essential (primary) hypertension: Secondary | ICD-10-CM

## 2018-07-01 LAB — CBC WITH DIFFERENTIAL (CANCER CENTER ONLY)
ABS IMMATURE GRANULOCYTES: 0.03 10*3/uL (ref 0.00–0.07)
BASOS PCT: 1 %
Basophils Absolute: 0 10*3/uL (ref 0.0–0.1)
EOS ABS: 0.1 10*3/uL (ref 0.0–0.5)
Eosinophils Relative: 2 %
HCT: 32.9 % — ABNORMAL LOW (ref 39.0–52.0)
Hemoglobin: 11 g/dL — ABNORMAL LOW (ref 13.0–17.0)
IMMATURE GRANULOCYTES: 1 %
Lymphocytes Relative: 10 %
Lymphs Abs: 0.6 10*3/uL — ABNORMAL LOW (ref 0.7–4.0)
MCH: 29.5 pg (ref 26.0–34.0)
MCHC: 33.4 g/dL (ref 30.0–36.0)
MCV: 88.2 fL (ref 80.0–100.0)
MONOS PCT: 18 %
Monocytes Absolute: 1 10*3/uL (ref 0.1–1.0)
NEUTROS ABS: 4 10*3/uL (ref 1.7–7.7)
NRBC: 0 % (ref 0.0–0.2)
Neutrophils Relative %: 68 %
PLATELETS: 146 10*3/uL — AB (ref 150–400)
RBC: 3.73 MIL/uL — AB (ref 4.22–5.81)
RDW: 14.3 % (ref 11.5–15.5)
WBC Count: 5.7 10*3/uL (ref 4.0–10.5)

## 2018-07-01 LAB — CMP (CANCER CENTER ONLY)
ALBUMIN: 3.3 g/dL — AB (ref 3.5–5.0)
ALT: 19 U/L (ref 0–44)
ANION GAP: 10 (ref 5–15)
AST: 17 U/L (ref 15–41)
Alkaline Phosphatase: 69 U/L (ref 38–126)
BILIRUBIN TOTAL: 0.4 mg/dL (ref 0.3–1.2)
BUN: 16 mg/dL (ref 8–23)
CALCIUM: 9.4 mg/dL (ref 8.9–10.3)
CO2: 23 mmol/L (ref 22–32)
Chloride: 109 mmol/L (ref 98–111)
Creatinine: 0.78 mg/dL (ref 0.61–1.24)
GFR, Estimated: >60 mL/min (ref >60)
GLUCOSE: 114 mg/dL — AB (ref 70–99)
POTASSIUM: 4.2 mmol/L (ref 3.5–5.1)
SODIUM: 142 mmol/L (ref 135–145)
TOTAL PROTEIN: 7.2 g/dL (ref 6.5–8.1)

## 2018-07-01 LAB — TSH: TSH: <0.08 u[IU]/mL — ABNORMAL LOW (ref 0.320–4.118)

## 2018-07-01 MED ORDER — PALONOSETRON HCL INJECTION 0.25 MG/5ML
INTRAVENOUS | Status: AC
  Filled 2018-07-01: qty 5

## 2018-07-01 MED ORDER — DIPHENHYDRAMINE HCL 50 MG/ML IJ SOLN
50.0000 mg | Freq: Once | INTRAMUSCULAR | Status: AC
  Administered 2018-07-01: 50 mg via INTRAVENOUS

## 2018-07-01 MED ORDER — SODIUM CHLORIDE 0.9 % IV SOLN
175.0000 mg/m2 | Freq: Once | INTRAVENOUS | Status: AC
  Administered 2018-07-01: 414 mg via INTRAVENOUS
  Filled 2018-07-01: qty 69

## 2018-07-01 MED ORDER — DIPHENHYDRAMINE HCL 50 MG/ML IJ SOLN
INTRAMUSCULAR | Status: AC
  Filled 2018-07-01: qty 1

## 2018-07-01 MED ORDER — FAMOTIDINE IN NACL 20-0.9 MG/50ML-% IV SOLN
INTRAVENOUS | Status: AC
  Filled 2018-07-01: qty 50

## 2018-07-01 MED ORDER — SODIUM CHLORIDE 0.9 % IV SOLN
Freq: Once | INTRAVENOUS | Status: AC
  Administered 2018-07-01: 12:00:00 via INTRAVENOUS
  Filled 2018-07-01: qty 5

## 2018-07-01 MED ORDER — SODIUM CHLORIDE 0.9 % IV SOLN
645.5000 mg | Freq: Once | INTRAVENOUS | Status: AC
  Administered 2018-07-01: 650 mg via INTRAVENOUS
  Filled 2018-07-01: qty 65

## 2018-07-01 MED ORDER — SODIUM CHLORIDE 0.9 % IV SOLN
200.0000 mg | Freq: Once | INTRAVENOUS | Status: AC
  Administered 2018-07-01: 200 mg via INTRAVENOUS
  Filled 2018-07-01: qty 8

## 2018-07-01 MED ORDER — OXYCODONE-ACETAMINOPHEN 5-325 MG PO TABS: 1.0000 | ORAL_TABLET | Freq: Three times a day (TID) | ORAL | 0 refills | Status: DC | PRN

## 2018-07-01 MED ORDER — SODIUM CHLORIDE 0.9 % IV SOLN
Freq: Once | INTRAVENOUS | Status: AC
  Administered 2018-07-01: 11:00:00 via INTRAVENOUS
  Filled 2018-07-01: qty 250

## 2018-07-01 MED ORDER — PALONOSETRON HCL INJECTION 0.25 MG/5ML
0.2500 mg | Freq: Once | INTRAVENOUS | Status: AC
  Administered 2018-07-01: 0.25 mg via INTRAVENOUS

## 2018-07-01 MED ORDER — FAMOTIDINE IN NACL 20-0.9 MG/50ML-% IV SOLN
20.0000 mg | Freq: Once | INTRAVENOUS | Status: AC
  Administered 2018-07-01: 20 mg via INTRAVENOUS

## 2018-07-01 NOTE — Progress Notes (Signed)
[place] [phone]   [phone]  OFFICE PROGRESS NOTE  [name], MD [place] [address]  DIAGNOSIS: Stage IV (T3, N0, M1C) non-small cell lung cancer, squamous cell carcinoma presented with large right infrahilar mass in addition to left upper lobe lung nodule as well as left axillary mass with left axillary lymph node diagnosed in August 2019.  PRIOR THERAPY: Palliative radiotherapy to the right infrahilar mass as well as the axillary mass under the care of Dr. Lisbeth Renshaw.  CURRENT THERAPY: Systemic chemotherapy with carboplatin for AUC of 5, paclitaxel 175 mg/M2 and Keytruda 200 mg IV every 3 weeks status post 2 cycles..  INTERVAL HISTORY: Travis Padilla 72 y.o. male returns to the clinic today for follow-up visit accompanied by his wife.  The patient is feeling fine today with no concerning complaints except for fatigue and aching pain after the Neulasta injection.  He denied having any chest pain, shortness of breath, cough or hemoptysis.  He denied having any fever or chills.  He has no nausea, vomiting, diarrhea or constipation.  He denied having any significant weight loss or night sweats.  He continues to tolerate his treatment well.  The patient is here today for evaluation before starting cycle #3.  MEDICAL HISTORY: Past Medical History:  Diagnosis Date  . Abnormal nuclear stress test    December, 2013  . Anemia   . CAD (coronary artery disease)    Mild nonobstructive plaque in cath 2013  . Chest pain    December, 2013  . Dizziness   . Gout   . Hemorrhoid   . Hyperlipidemia   . Hypertension   . Skin cancer   . Spinal stenosis     ALLERGIES:  is allergic to trazodone and nefazodone.  MEDICATIONS:  Current Outpatient Medications  Medication Sig Dispense Refill  . acetaminophen (TYLENOL) 500 MG tablet Take 1,000 mg by mouth every 6 (six) hours as needed.    Marland Kitchen albuterol (PROVENTIL) (2.5 MG/3ML) 0.083%  nebulizer solution Take 3 mLs (2.5 mg total) by nebulization every 6 (six) hours as needed for wheezing or shortness of breath. (Patient not taking: Reported on 05/30/2018) 75 mL 12  . ALPRAZolam (XANAX) 0.25 MG tablet Take 1 tablet (0.25 mg total) by mouth at bedtime as needed for anxiety. 30 tablet 0  . Artificial Tear Solution (SOOTHE XP OP) Place 1 drop into both eyes 2 (two) times daily.    . carvedilol (COREG) 12.5 MG tablet TAKE 1 AND 1/2 TABLETS BY MOUTH TWO TIMES DAILY WITH A MEAL (Patient taking differently: Take 18.75 mg by mouth 2 (two) times daily with a meal. ) 270 tablet 3  . clindamycin (CLINDAGEL) 1 % gel Apply topically 2 (two) times daily as needed. 30 g 0  . Glucosamine-Chondroitin (OSTEO BI-FLEX REGULAR STRENGTH PO) Take 1 tablet by mouth daily.    . Multiple Vitamin (MULTIVITAMIN) tablet Take 1 tablet by mouth daily.    . pravastatin (PRAVACHOL) 40 MG tablet TAKE 1 TABLET BY MOUTH EVERY DAY (Patient taking differently: Take 40 mg by mouth at bedtime. ) 90 tablet 3  . prochlorperazine (COMPAZINE) 10 MG tablet Take 1 tablet (10 mg total) by mouth every 6 (six) hours as needed for nausea or vomiting. 30 tablet 1  . Wound Dressings (SONAFINE EX) Apply 1 application topically daily.     No current facility-administered medications for this visit.     SURGICAL HISTORY:  Past  Surgical History:  Procedure Laterality Date  . 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 BELPHAROPTOSIS REPAIR Bilateral   . CATARACT EXTRACTION W/ INTRAOCULAR LENS  IMPLANT, BILATERAL    . COLONOSCOPY N/A 11/03/2014   Procedure: COLONOSCOPY;  Surgeon: Rogene Houston, MD;  [facility];  Service: Endoscopy;  Laterality: N/A;  1225  . KNEE CARTILAGE SURGERY Left    Left knee  . SKIN CANCER EXCISION  12/2014, 04/25/15  . VIDEO BRONCHOSCOPY Bilateral 05/09/2018   Procedure: VIDEO BRONCHOSCOPY WITH FLUORO;  Surgeon: Juanito Doom, MD;  Location: [facility];  Service: Cardiopulmonary;  Laterality:  Bilateral;    REVIEW OF SYSTEMS:  A comprehensive review of systems was negative except for: Constitutional: positive for fatigue Musculoskeletal: positive for arthralgias   PHYSICAL EXAMINATION: General appearance: alert, cooperative and no distress Head: Normocephalic, without obvious abnormality, atraumatic Neck: no adenopathy, no JVD, supple, symmetrical, trachea midline and thyroid not enlarged, symmetric, no tenderness/mass/nodules Lymph nodes: Cervical, supraclavicular, and axillary nodes normal. Resp: clear to auscultation bilaterally Back: symmetric, no curvature. ROM normal. No CVA tenderness. Cardio: regular rate and rhythm, S1, S2 normal, no murmur, click, rub or gallop GI: soft, non-tender; bowel sounds normal; no masses,  no organomegaly Extremities: extremities normal, atraumatic, no cyanosis or edema  ECOG PERFORMANCE STATUS: 1 - Symptomatic but completely ambulatory  Blood pressure 116/75, pulse 82, temperature 98.3 F (36.8 C), temperature source Oral, resp. rate 20, height 6\' 1"  (1.854 m), weight 225 lb 8 oz (102.3 kg), SpO2 100 %.  LABORATORY DATA: Lab Results  Component Value Date   WBC 5.7 07/01/2018   HGB 11.0 (L) 07/01/2018   HCT 32.9 (L) 07/01/2018   MCV 88.2 07/01/2018   PLT 146 (L) 07/01/2018      Chemistry      Component Value Date/Time   NA 143 06/23/2018 1027   NA 144 04/09/2018 1507   K 4.8 06/23/2018 1027   CL 108 06/23/2018 1027   CO2 28 06/23/2018 1027   BUN 15 06/23/2018 1027   BUN 15 04/09/2018 1507   CREATININE 0.80 06/23/2018 1027   CREATININE 0.90 06/16/2018 1224      Component Value Date/Time   CALCIUM 9.5 06/23/2018 1027   ALKPHOS 74 06/23/2018 1027   AST 19 06/23/2018 1027   AST 26 06/16/2018 1224   ALT 22 06/23/2018 1027   ALT 33 06/16/2018 1224   BILITOT 0.5 06/23/2018 1027   BILITOT 0.4 06/16/2018 1224       RADIOGRAPHIC STUDIES: No results found.  ASSESSMENT AND PLAN: This is a very pleasant 72 years old white  male with very light most smoking history recently diagnosed with stage IV (T3, N0, M1c) non-small cell lung cancer, squamous cell carcinoma based on the biopsy from the left axillary mass. The patient is currently undergoing systemic chemotherapy with carboplatin, paclitaxel and Keytruda status post 2 cycles.  Has been tolerating this treatment well with no concerning complaints except for aching pain after the Neulasta injection. I recommended for him to proceed with cycle #3 today as scheduled. I will see him back for follow-up visit in 3 weeks for evaluation after repeating CT scan of the chest, abdomen and pelvis for restaging of his disease. For the aching pain after the Neulasta injection, he will continue on Claritin and I will give him prescription for Percocet to be used on as-needed basis. The patient was advised to call immediately if he has any concerning symptoms in the interval.  The patient voices understanding of current disease status and treatment options and is in agreement with the current care plan. All questions were answered. The patient knows to call the clinic with any problems, questions or concerns. We can certainly see the patient much sooner if necessary.  I spent 10 minutes counseling the patient face to face. The total time spent in the appointment was 15 minutes.  Disclaimer: This note was dictated with voice recognition software. Similar sounding words can inadvertently be transcribed and may not be corrected upon review.

## 2018-07-01 NOTE — Progress Notes (Signed)
Went to infusion area to introduce myself as Arboriculturist and to offer available resources.  Gave patient an application for [employer] for patients whom live in [place]. Advised he and spouse if interested in applying, they may return application and supporting documents to myself or to the foundation. They verbalized understanding.  Discussed the one-time $500 [place] to assist with personal expenses such as gas cards, medication copays;etc. Advised what is needed to apply(proof of household income). They verbalized understanding and have my card for any additional financial questions or concerns.

## 2018-07-01 NOTE — Telephone Encounter (Signed)
Printed avs and calender of upcoming appointment. Per 10/22 los

## 2018-07-01 NOTE — Patient Instructions (Signed)
[facility] Discharge Instructions for Patients Receiving Chemotherapy  Today you received the following chemotherapy agents: Keytruda, Taxol, Carboplatin  To help prevent nausea and vomiting after your treatment, we encourage you to take your nausea medication as directed.   If you develop nausea and vomiting that is not controlled by your nausea medication, call the clinic.   BELOW ARE SYMPTOMS THAT SHOULD BE REPORTED IMMEDIATELY:  *FEVER GREATER THAN 100.5 F  *CHILLS WITH OR WITHOUT FEVER  NAUSEA AND VOMITING THAT IS NOT CONTROLLED WITH YOUR NAUSEA MEDICATION  *UNUSUAL SHORTNESS OF BREATH  *UNUSUAL BRUISING OR BLEEDING  TENDERNESS IN MOUTH AND THROAT WITH OR WITHOUT PRESENCE OF ULCERS  *URINARY PROBLEMS  *BOWEL PROBLEMS  UNUSUAL RASH Items with * indicate a potential emergency and should be followed up as soon as possible.  Feel free to call the clinic should you have any questions or concerns. The clinic phone number is [phone].  Please show the CHEMO ALERT CARD at check-in to the Emergency Department and triage nurse.   

## 2018-07-02 ENCOUNTER — Encounter: Payer: Self-pay | Admitting: Internal Medicine

## 2018-07-02 NOTE — Progress Notes (Signed)
Patient called and left a voicemail regarding grant for transportation.  Called patient back to discuss the qualifications for the one-time $500 [place] that I discussed with him on 07/01/18. Answered questions. Patient will bring proof of income on 07/03/18 to apply for grant. He has my card for any additional financial questions or concerns.

## 2018-07-03 ENCOUNTER — Encounter: Payer: Self-pay | Admitting: Internal Medicine

## 2018-07-03 ENCOUNTER — Inpatient Hospital Stay: Payer: Medicare Other

## 2018-07-03 VITALS — BP 121/79 | HR 77 | Temp 98.0°F | Resp 18

## 2018-07-03 DIAGNOSIS — C773 Secondary and unspecified malignant neoplasm of axilla and upper limb lymph nodes: Secondary | ICD-10-CM | POA: Diagnosis not present

## 2018-07-03 DIAGNOSIS — C3491 Malignant neoplasm of unspecified part of right bronchus or lung: Secondary | ICD-10-CM | POA: Diagnosis not present

## 2018-07-03 DIAGNOSIS — Z923 Personal history of irradiation: Secondary | ICD-10-CM | POA: Diagnosis not present

## 2018-07-03 DIAGNOSIS — Z7689 Persons encountering health services in other specified circumstances: Secondary | ICD-10-CM | POA: Diagnosis not present

## 2018-07-03 DIAGNOSIS — Z5112 Encounter for antineoplastic immunotherapy: Secondary | ICD-10-CM | POA: Diagnosis not present

## 2018-07-03 DIAGNOSIS — Z5111 Encounter for antineoplastic chemotherapy: Secondary | ICD-10-CM | POA: Diagnosis not present

## 2018-07-03 MED ORDER — PEGFILGRASTIM-CBQV 6 MG/0.6ML ~~LOC~~ SOSY
PREFILLED_SYRINGE | SUBCUTANEOUS | Status: AC
  Filled 2018-07-03: qty 0.6

## 2018-07-03 MED ORDER — PEGFILGRASTIM-CBQV 6 MG/0.6ML ~~LOC~~ SOSY
6.0000 mg | PREFILLED_SYRINGE | Freq: Once | SUBCUTANEOUS | Status: AC
  Administered 2018-07-03: 6 mg via SUBCUTANEOUS

## 2018-07-03 NOTE — Progress Notes (Signed)
Patient came in and brought proof of income for the one-time grant.  Patient approved for the grant. He has a copy of the approval letter as well as the expense sheet it covers along with the Outpatient pharmacy information. Went over in detail expenses and how they are paid. He received a gas card today from the grant.   Gave my card for any additional financial questions or concerns.

## 2018-07-07 ENCOUNTER — Inpatient Hospital Stay (HOSPITAL_BASED_OUTPATIENT_CLINIC_OR_DEPARTMENT_OTHER): Payer: Medicare Other | Admitting: Medical

## 2018-07-07 ENCOUNTER — Inpatient Hospital Stay: Payer: Medicare Other

## 2018-07-07 ENCOUNTER — Telehealth: Payer: Self-pay

## 2018-07-07 ENCOUNTER — Encounter: Payer: Self-pay | Admitting: Medical

## 2018-07-07 ENCOUNTER — Other Ambulatory Visit: Payer: Self-pay | Admitting: Medical Oncology

## 2018-07-07 VITALS — BP 141/77 | HR 81 | Temp 98.7°F | Resp 18 | Ht 73.0 in | Wt 222.6 lb

## 2018-07-07 DIAGNOSIS — R5383 Other fatigue: Secondary | ICD-10-CM

## 2018-07-07 DIAGNOSIS — C773 Secondary and unspecified malignant neoplasm of axilla and upper limb lymph nodes: Secondary | ICD-10-CM

## 2018-07-07 DIAGNOSIS — H60502 Unspecified acute noninfective otitis externa, left ear: Secondary | ICD-10-CM | POA: Diagnosis not present

## 2018-07-07 DIAGNOSIS — K649 Unspecified hemorrhoids: Secondary | ICD-10-CM | POA: Diagnosis not present

## 2018-07-07 DIAGNOSIS — Z5111 Encounter for antineoplastic chemotherapy: Secondary | ICD-10-CM | POA: Diagnosis not present

## 2018-07-07 DIAGNOSIS — Z85828 Personal history of other malignant neoplasm of skin: Secondary | ICD-10-CM

## 2018-07-07 DIAGNOSIS — M2550 Pain in unspecified joint: Secondary | ICD-10-CM

## 2018-07-07 DIAGNOSIS — C3491 Malignant neoplasm of unspecified part of right bronchus or lung: Secondary | ICD-10-CM

## 2018-07-07 DIAGNOSIS — E785 Hyperlipidemia, unspecified: Secondary | ICD-10-CM

## 2018-07-07 DIAGNOSIS — I2510 Atherosclerotic heart disease of native coronary artery without angina pectoris: Secondary | ICD-10-CM | POA: Diagnosis not present

## 2018-07-07 DIAGNOSIS — Z923 Personal history of irradiation: Secondary | ICD-10-CM | POA: Diagnosis not present

## 2018-07-07 DIAGNOSIS — Z7689 Persons encountering health services in other specified circumstances: Secondary | ICD-10-CM | POA: Diagnosis not present

## 2018-07-07 DIAGNOSIS — G4700 Insomnia, unspecified: Secondary | ICD-10-CM | POA: Diagnosis not present

## 2018-07-07 DIAGNOSIS — K5900 Constipation, unspecified: Secondary | ICD-10-CM | POA: Diagnosis not present

## 2018-07-07 DIAGNOSIS — R630 Anorexia: Secondary | ICD-10-CM

## 2018-07-07 DIAGNOSIS — Z87891 Personal history of nicotine dependence: Secondary | ICD-10-CM

## 2018-07-07 DIAGNOSIS — Z79899 Other long term (current) drug therapy: Secondary | ICD-10-CM

## 2018-07-07 DIAGNOSIS — Z5112 Encounter for antineoplastic immunotherapy: Secondary | ICD-10-CM | POA: Diagnosis not present

## 2018-07-07 DIAGNOSIS — I10 Essential (primary) hypertension: Secondary | ICD-10-CM | POA: Diagnosis not present

## 2018-07-07 LAB — CBC WITH DIFFERENTIAL (CANCER CENTER ONLY)
Abs Immature Granulocytes: 0.05 10*3/uL (ref 0.00–0.07)
BASOS ABS: 0 10*3/uL (ref 0.0–0.1)
Basophils Relative: 0 %
EOS ABS: 0.2 10*3/uL (ref 0.0–0.5)
EOS PCT: 2 %
HCT: 35.5 % — ABNORMAL LOW (ref 39.0–52.0)
HEMOGLOBIN: 11.9 g/dL — AB (ref 13.0–17.0)
Immature Granulocytes: 1 %
LYMPHS PCT: 5 %
Lymphs Abs: 0.5 10*3/uL — ABNORMAL LOW (ref 0.7–4.0)
MCH: 29.9 pg (ref 26.0–34.0)
MCHC: 33.5 g/dL (ref 30.0–36.0)
MCV: 89.2 fL (ref 80.0–100.0)
Monocytes Absolute: 0.4 10*3/uL (ref 0.1–1.0)
Monocytes Relative: 5 %
NRBC: 0 % (ref 0.0–0.2)
Neutro Abs: 8.6 10*3/uL — ABNORMAL HIGH (ref 1.7–7.7)
Neutrophils Relative %: 87 %
PLATELETS: 117 10*3/uL — AB (ref 150–400)
RBC: 3.98 MIL/uL — AB (ref 4.22–5.81)
RDW: 14.1 % (ref 11.5–15.5)
WBC: 9.8 10*3/uL (ref 4.0–10.5)

## 2018-07-07 LAB — CMP (CANCER CENTER ONLY)
ALT: 29 U/L (ref 0–44)
AST: 25 U/L (ref 15–41)
Albumin: 3.7 g/dL (ref 3.5–5.0)
Alkaline Phosphatase: 103 U/L (ref 38–126)
Anion gap: 6 (ref 5–15)
BUN: 15 mg/dL (ref 8–23)
CHLORIDE: 104 mmol/L (ref 98–111)
CO2: 28 mmol/L (ref 22–32)
CREATININE: 0.98 mg/dL (ref 0.61–1.24)
Calcium: 9.7 mg/dL (ref 8.9–10.3)
Glucose, Bld: 85 mg/dL (ref 70–99)
Potassium: 4.7 mmol/L (ref 3.5–5.1)
Sodium: 138 mmol/L (ref 135–145)
Total Bilirubin: 0.6 mg/dL (ref 0.3–1.2)
Total Protein: 7.7 g/dL (ref 6.5–8.1)

## 2018-07-07 MED ORDER — HYDROCORTISONE 2.5 % RE CREA: 1.0000 "application " | TOPICAL_CREAM | Freq: Four times a day (QID) | RECTAL | 3 refills | Status: DC

## 2018-07-07 MED ORDER — NEOMYCIN-POLYMYXIN-HC 1 % OT SOLN: 3.0000 [drp] | Freq: Four times a day (QID) | OTIC | 0 refills | Status: DC

## 2018-07-07 MED ORDER — MIRTAZAPINE 15 MG PO TABS: 15.0000 mg | ORAL_TABLET | Freq: Every day | ORAL | 5 refills | Status: DC

## 2018-07-07 MED ORDER — SENNOSIDES-DOCUSATE SODIUM 8.6-50 MG PO TABS: ORAL_TABLET | ORAL | 5 refills | Status: DC

## 2018-07-07 NOTE — Telephone Encounter (Signed)
Received phone call from pt with c/o left inner ear pain. Started two days ago. Pt questioning if this should be a visit to the PCP or if Dr. Julien Nordmann wanted to evaluate potential connection with chemotherapy. Pt receiving taxol/carbo/keytruda. Pt of Dr. Julien Nordmann. Discussed symptom with desk nurse who asked if [name], [place] could see the pt today. Thought pt was currently in infusion per original conversation with pt. [name], PA ok to see pt for quick eval of ear after lab work. Only appt for lab work today. Called pt back and ok to see [name], [place] today after lab work. Appt added for 1200.

## 2018-07-08 ENCOUNTER — Telehealth: Payer: Self-pay | Admitting: Medical

## 2018-07-08 ENCOUNTER — Inpatient Hospital Stay: Payer: Medicare Other

## 2018-07-08 NOTE — Telephone Encounter (Signed)
No 10/28 los nor referrals.

## 2018-07-10 ENCOUNTER — Other Ambulatory Visit: Payer: Self-pay | Admitting: Radiology

## 2018-07-11 ENCOUNTER — Encounter (HOSPITAL_COMMUNITY): Payer: Self-pay

## 2018-07-11 ENCOUNTER — Ambulatory Visit (HOSPITAL_COMMUNITY)
Admission: RE | Admit: 2018-07-11 | Discharge: 2018-07-11 | Disposition: A | Discharge status: Home / Self Care | Payer: Medicare Other | Source: Ambulatory Visit | Attending: Internal Medicine | Admitting: Internal Medicine

## 2018-07-11 ENCOUNTER — Other Ambulatory Visit: Payer: Self-pay | Admitting: Internal Medicine

## 2018-07-11 DIAGNOSIS — Z5111 Encounter for antineoplastic chemotherapy: Secondary | ICD-10-CM | POA: Diagnosis not present

## 2018-07-11 DIAGNOSIS — Z85828 Personal history of other malignant neoplasm of skin: Secondary | ICD-10-CM | POA: Insufficient documentation

## 2018-07-11 DIAGNOSIS — I10 Essential (primary) hypertension: Secondary | ICD-10-CM | POA: Diagnosis not present

## 2018-07-11 DIAGNOSIS — E785 Hyperlipidemia, unspecified: Secondary | ICD-10-CM | POA: Diagnosis not present

## 2018-07-11 DIAGNOSIS — Z79899 Other long term (current) drug therapy: Secondary | ICD-10-CM | POA: Insufficient documentation

## 2018-07-11 DIAGNOSIS — Z9842 Cataract extraction status, left eye: Secondary | ICD-10-CM | POA: Diagnosis not present

## 2018-07-11 DIAGNOSIS — C3490 Malignant neoplasm of unspecified part of unspecified bronchus or lung: Secondary | ICD-10-CM | POA: Diagnosis not present

## 2018-07-11 DIAGNOSIS — I2510 Atherosclerotic heart disease of native coronary artery without angina pectoris: Secondary | ICD-10-CM | POA: Insufficient documentation

## 2018-07-11 DIAGNOSIS — Z9889 Other specified postprocedural states: Secondary | ICD-10-CM | POA: Insufficient documentation

## 2018-07-11 DIAGNOSIS — C3491 Malignant neoplasm of unspecified part of right bronchus or lung: Secondary | ICD-10-CM

## 2018-07-11 DIAGNOSIS — M109 Gout, unspecified: Secondary | ICD-10-CM | POA: Insufficient documentation

## 2018-07-11 DIAGNOSIS — Z9841 Cataract extraction status, right eye: Secondary | ICD-10-CM | POA: Diagnosis not present

## 2018-07-11 DIAGNOSIS — Z888 Allergy status to other drugs, medicaments and biological substances status: Secondary | ICD-10-CM | POA: Insufficient documentation

## 2018-07-11 HISTORY — PX: IR IMAGING GUIDED PORT INSERTION: IMG5740

## 2018-07-11 LAB — CBC WITH DIFFERENTIAL/PLATELET
BAND NEUTROPHILS: 3 %
EOS ABS: 0.3 10*3/uL (ref 0.0–0.5)
Eosinophils Relative: 3 %
HEMATOCRIT: 33.2 % — AB (ref 39.0–52.0)
HEMOGLOBIN: 10.7 g/dL — AB (ref 13.0–17.0)
Lymphocytes Relative: 8 %
Lymphs Abs: 0.8 10*3/uL (ref 0.7–4.0)
MCH: 29.6 pg (ref 26.0–34.0)
MCHC: 32.2 g/dL (ref 30.0–36.0)
MCV: 91.7 fL (ref 80.0–100.0)
Monocytes Absolute: 0.9 10*3/uL (ref 0.1–1.0)
Monocytes Relative: 10 %
NEUTROS ABS: 7.2 10*3/uL (ref 1.7–7.7)
NEUTROS PCT: 76 %
NRBC: 0 % (ref 0.0–0.2)
Platelets: 152 10*3/uL (ref 150–400)
RBC: 3.62 MIL/uL — ABNORMAL LOW (ref 4.22–5.81)
RDW: 14.6 % (ref 11.5–15.5)
WBC: 9.5 10*3/uL (ref 4.0–10.5)

## 2018-07-11 LAB — COMPREHENSIVE METABOLIC PANEL
ALBUMIN: 3.7 g/dL (ref 3.5–5.0)
ALT: 30 U/L (ref 0–44)
AST: 24 U/L (ref 15–41)
Alkaline Phosphatase: 79 U/L (ref 38–126)
Anion gap: 8 (ref 5–15)
BILIRUBIN TOTAL: 0.5 mg/dL (ref 0.3–1.2)
BUN: 11 mg/dL (ref 8–23)
CO2: 23 mmol/L (ref 22–32)
CREATININE: 0.8 mg/dL (ref 0.61–1.24)
Calcium: 8.8 mg/dL — ABNORMAL LOW (ref 8.9–10.3)
Chloride: 108 mmol/L (ref 98–111)
GFR calc Af Amer: >60 mL/min (ref >60)
GLUCOSE: 101 mg/dL — AB (ref 70–99)
Potassium: 4.2 mmol/L (ref 3.5–5.1)
Sodium: 139 mmol/L (ref 135–145)
TOTAL PROTEIN: 7.2 g/dL (ref 6.5–8.1)

## 2018-07-11 LAB — PROTIME-INR
INR: 0.93
Prothrombin Time: 12.3 seconds (ref 11.4–15.2)

## 2018-07-11 MED ORDER — FENTANYL CITRATE (PF) 100 MCG/2ML IJ SOLN
INTRAMUSCULAR | Status: AC
  Filled 2018-07-11: qty 2

## 2018-07-11 MED ORDER — FENTANYL CITRATE (PF) 100 MCG/2ML IJ SOLN
INTRAMUSCULAR | Status: AC | PRN
  Administered 2018-07-11 (×2): 50 ug via INTRAVENOUS

## 2018-07-11 MED ORDER — HEPARIN SOD (PORK) LOCK FLUSH 100 UNIT/ML IV SOLN
INTRAVENOUS | Status: AC | PRN
  Administered 2018-07-11: 500 [IU] via INTRAVENOUS

## 2018-07-11 MED ORDER — HEPARIN SOD (PORK) LOCK FLUSH 100 UNIT/ML IV SOLN
INTRAVENOUS | Status: AC
  Filled 2018-07-11: qty 5

## 2018-07-11 MED ORDER — MIDAZOLAM HCL 2 MG/2ML IJ SOLN
INTRAMUSCULAR | Status: AC | PRN
  Administered 2018-07-11 (×3): 1 mg via INTRAVENOUS

## 2018-07-11 MED ORDER — MIDAZOLAM HCL 2 MG/2ML IJ SOLN
INTRAMUSCULAR | Status: AC
  Filled 2018-07-11: qty 4

## 2018-07-11 MED ORDER — CEFAZOLIN SODIUM-DEXTROSE 2-4 GM/100ML-% IV SOLN
INTRAVENOUS | Status: AC
  Filled 2018-07-11: qty 100

## 2018-07-11 MED ORDER — LIDOCAINE HCL (PF) 1 % IJ SOLN
INTRAMUSCULAR | Status: AC | PRN
  Administered 2018-07-11: 5 mL
  Administered 2018-07-11: 10 mL

## 2018-07-11 MED ORDER — CEFAZOLIN SODIUM-DEXTROSE 2-4 GM/100ML-% IV SOLN
2.0000 g | INTRAVENOUS | Status: AC
  Administered 2018-07-11: 2 g via INTRAVENOUS

## 2018-07-11 MED ORDER — SODIUM CHLORIDE 0.9 % IV SOLN
INTRAVENOUS | Status: DC
  Administered 2018-07-11: 500 mL via INTRAVENOUS

## 2018-07-11 MED ORDER — LIDOCAINE HCL (PF) 1 % IJ SOLN
INTRAMUSCULAR | Status: AC
  Filled 2018-07-11: qty 30

## 2018-07-11 NOTE — Procedures (Signed)
Interventional Radiology Procedure Note  Procedure: Single Lumen Power Port Placement    Access:  Right IJ vein.  Findings: Catheter tip positioned at SVC/RA junction. Port is ready for immediate use.   Complications: None  EBL: < 10 mL  Recommendations:  - Ok to shower in 24 hours - Do not submerge for 7 days - Routine line care   Omarii Scalzo T. Jamirah Zelaya, M.D Pager:  [phone]   

## 2018-07-11 NOTE — Progress Notes (Signed)
Pt d/c to cancer center.  Pt and his wife need to talk to the financial aid staff.  Pt taken to cancer center via wheelchair.

## 2018-07-11 NOTE — Discharge Instructions (Signed)
You may remove your dressing in 24 hours.  11am November 2.  After that you may take a shower and get the area wet.  Do not scrub the area.  If you have been given Emla cream to numb the port site please DO NOT start this until 2 weeks after the port was inserted.  The cream can cause the dermabond glue to come off.     Implanted Port Insertion, Care After This sheet gives you information about how to care for yourself after your procedure. Your health care provider may also give you more specific instructions. If you have problems or questions, contact your health care provider. What can I expect after the procedure? After your procedure, it is common to have: Discomfort at the port insertion site. Bruising on the skin over the port. This should improve over 3-4 days.  Follow these instructions at home: [facility] care After your port is placed, you will get a manufacturer's information card. The card has information about your port. Keep this card with you at all times. Take care of the port as told by your health care provider. Ask your health care provider if you or a family member can get training for taking care of the port at home. A home health care nurse may also take care of the port. Make sure to remember what type of port you have. Incision care Follow instructions from your health care provider about how to take care of your port insertion site. Make sure you: Wash your hands with soap and water before you change your bandage (dressing). If soap and water are not available, use hand sanitizer. Change your dressing as told by your health care provider. Leave stitches (sutures), skin glue, or adhesive strips in place. These skin closures may need to stay in place for 2 weeks or longer. If adhesive strip edges start to loosen and curl up, you may trim the loose edges. Do not remove adhesive strips completely unless your health care provider tells you to do that. Check your port insertion site  every day for signs of infection. Check for: More redness, swelling, or pain. More fluid or blood. Warmth. Pus or a bad smell. General instructions Do not take baths, swim, or use a hot tub until your health care provider approves. Do not lift anything that is heavier than 10 lb (4.5 kg) for a week, or as told by your health care provider. Ask your health care provider when it is okay to: Return to work or school. Resume usual physical activities or sports. Do not drive for 24 hours if you were given a medicine to help you relax (sedative). Take over-the-counter and prescription medicines only as told by your health care provider. Wear a medical alert bracelet in case of an emergency. This will tell any health care providers that you have a port. Keep all follow-up visits as told by your health care provider. This is important. Contact a health care provider if: You cannot flush your port with saline as directed, or you cannot draw blood from the port. You have a fever or chills. You have more redness, swelling, or pain around your port insertion site. You have more fluid or blood coming from your port insertion site. Your port insertion site feels warm to the touch. You have pus or a bad smell coming from the port insertion site. Get help right away if: You have chest pain or shortness of breath. You have bleeding from your port  that you cannot control. Summary Take care of the port as told by your health care provider. Change your dressing as told by your health care provider. Keep all follow-up visits as told by your health care provider. This information is not intended to replace advice given to you by your health care provider. Make sure you discuss any questions you have with your health care provider. Document Released: 06/17/2013 Document Revised: 07/18/2016 Document Reviewed: 07/18/2016 Elsevier Interactive Patient Education  2017 [place] An  implanted port is a type of central line that is placed under the skin. Central lines are used to provide IV access when treatment or nutrition needs to be given through a persons veins. Implanted ports are used for long-term IV access. An implanted port may be placed because:  You need IV medicine that would be irritating to the small veins in your hands or arms.  You need long-term IV medicines, such as antibiotics.  You need IV nutrition for a long period.  You need frequent blood draws for lab tests.  You need dialysis.  Implanted ports are usually placed in the chest area, but they can also be placed in the upper arm, the abdomen, or the leg. An implanted port has two main parts:  Reservoir. The reservoir is round and will appear as a small, raised area under your skin. The reservoir is the part where a needle is inserted to give medicines or draw blood.  Catheter. The catheter is a thin, flexible tube that extends from the reservoir. The catheter is placed into a large vein. Medicine that is inserted into the reservoir goes into the catheter and then into the vein.  How will I care for my incision site? Do not get the incision site wet. Bathe or shower as directed by your health care provider. How is my port accessed? Special steps must be taken to access the port:  Before the port is accessed, a numbing cream can be placed on the skin. This helps numb the skin over the port site.  Your health care provider uses a sterile technique to access the port. ? Your health care provider must put on a mask and sterile gloves. ? The skin over your port is cleaned carefully with an antiseptic and allowed to dry. ? The port is gently pinched between sterile gloves, and a needle is inserted into the port.  Only "non-coring" port needles should be used to access the port. Once the port is accessed, a blood return should be checked. This helps ensure that the port is in the vein and is not  clogged.  If your port needs to remain accessed for a constant infusion, a clear (transparent) bandage will be placed over the needle site. The bandage and needle will need to be changed every week, or as directed by your health care provider.  Keep the bandage covering the needle clean and dry. Do not get it wet. Follow your health care providers instructions on how to take a shower or bath while the port is accessed.  If your port does not need to stay accessed, no bandage is needed over the port.  What is flushing? Flushing helps keep the port from getting clogged. Follow your health care providers instructions on how and when to flush the port. Ports are usually flushed with saline solution or a medicine called heparin. The need for flushing will depend on how the port is used.  If the port is  used for intermittent medicines or blood draws, the port will need to be flushed: ? After medicines have been given. ? After blood has been drawn. ? As part of routine maintenance.  If a constant infusion is running, the port may not need to be flushed.  How long will my port stay implanted? The port can stay in for as long as your health care provider thinks it is needed. When it is time for the port to come out, surgery will be done to remove it. The procedure is similar to the one performed when the port was put in. When should I seek immediate medical care? When you have an implanted port, you should seek immediate medical care if:  You notice a bad smell coming from the incision site.  You have swelling, redness, or drainage at the incision site.  You have more swelling or pain at the port site or the surrounding area.  You have a fever that is not controlled with medicine.  This information is not intended to replace advice given to you by your health care provider. Make sure you discuss any questions you have with your health care provider. Document Released: 08/27/2005 Document  Revised: 02/02/2016 Document Reviewed: 05/04/2013 Elsevier Interactive Patient Education  2017 [place]. Moderate Conscious Sedation, Adult, Care After These instructions provide you with information about caring for yourself after your procedure. Your health care provider may also give you more specific instructions. Your treatment has been planned according to current medical practices, but problems sometimes occur. Call your health care provider if you have any problems or questions after your procedure. What can I expect after the procedure? After your procedure, it is common:  To feel sleepy for several hours.  To feel clumsy and have poor balance for several hours.  To have poor judgment for several hours.  To vomit if you eat too soon.  Follow these instructions at home: For at least 24 hours after the procedure:   Do not: ? Participate in activities where you could fall or become injured. ? Drive. ? Use heavy machinery. ? Drink alcohol. ? Take sleeping pills or medicines that cause drowsiness. ? Make important decisions or sign legal documents. ? Take care of children on your own.  Rest. Eating and drinking  Follow the diet recommended by your health care provider.  If you vomit: ? Drink water, juice, or soup when you can drink without vomiting. ? Make sure you have little or no nausea before eating solid foods. General instructions  Have a responsible adult stay with you until you are awake and alert.  Take over-the-counter and prescription medicines only as told by your health care provider.  If you smoke, do not smoke without supervision.  Keep all follow-up visits as told by your health care provider. This is important. Contact a health care provider if:  You keep feeling nauseous or you keep vomiting.  You feel light-headed.  You develop a rash.  You have a fever. Get help right away if:  You have trouble breathing. This information is not  intended to replace advice given to you by your health care provider. Make sure you discuss any questions you have with your health care provider. Document Released: 06/17/2013 Document Revised: 01/30/2016 Document Reviewed: 12/17/2015 Elsevier Interactive Patient Education  [employer].

## 2018-07-11 NOTE — H&P (Signed)
Referring Physician(s): [name],[name]  Supervising Physician: Aletta Edouard  Patient Status:  [facility]   Chief Complaint:  "I'm getting a port"  Subjective: Patient familiar to IR service from prior left axillary lymph node biopsy on 04/29/2018.  He has a history of metastatic squamous cell carcinoma of the lung and presents today for Port-A-Cath placement for chemotherapy.  He currently denies fever, headache, chest pain, dyspnea, cough, abdominal pain, nausea, vomiting or bleeding.  He does have occasional back pain.  Past Medical History:  Diagnosis Date  . Abnormal nuclear stress test    December, 2013  . Anemia   . CAD (coronary artery disease)    Mild nonobstructive plaque in cath 2013  . Chest pain    December, 2013  . Dizziness   . Gout   . Hemorrhoid   . Hyperlipidemia   . Hypertension   . Skin cancer   . Spinal stenosis    Past Surgical History:  Procedure Laterality Date  . 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 BELPHAROPTOSIS REPAIR Bilateral   . CATARACT EXTRACTION W/ INTRAOCULAR LENS  IMPLANT, BILATERAL    . COLONOSCOPY N/A 11/03/2014   Procedure: COLONOSCOPY;  Surgeon: Rogene Houston, MD;  Location: [facility];  Service: Endoscopy;  Laterality: N/A;  1225  . KNEE CARTILAGE SURGERY Left    Left knee  . SKIN CANCER EXCISION  12/2014, 04/25/15  . VIDEO BRONCHOSCOPY Bilateral 05/09/2018   Procedure: VIDEO BRONCHOSCOPY WITH FLUORO;  Surgeon: Juanito Doom, MD;  Location: [facility] ENDOSCOPY;  Service: Cardiopulmonary;  Laterality: Bilateral;       Allergies: Trazodone and nefazodone  Medications: Prior to Admission medications   Medication Sig Start Date End Date Taking? Authorizing Provider  acetaminophen (TYLENOL) 500 MG tablet Take 1,000 mg by mouth every 6 (six) hours as needed.    [provider]  albuterol (PROVENTIL) (2.5 MG/3ML) 0.083% nebulizer solution Take 3 mLs (2.5 mg total) by nebulization every 6 (six) hours as needed for  wheezing or shortness of breath. Patient not taking: Reported on 05/30/2018 05/07/18   Juanito Doom, MD  ALPRAZolam Duanne Moron) 0.25 MG tablet Take 1 tablet (0.25 mg total) by mouth at bedtime as needed for anxiety. 05/07/18   Curt Bears, MD  Artificial Tear Solution (SOOTHE XP OP) Place 1 drop into both eyes 2 (two) times daily.    [provider]  carvedilol (COREG) 12.5 MG tablet TAKE 1 AND 1/2 TABLETS BY MOUTH TWO TIMES DAILY WITH A MEAL Patient taking differently: Take 18.75 mg by mouth 2 (two) times daily with a meal.  04/17/18   [name], MD  clindamycin (CLINDAGEL) 1 % gel Apply topically 2 (two) times daily as needed. 06/09/18   Maryanna Shape, NP  Glucosamine-Chondroitin (OSTEO BI-FLEX REGULAR STRENGTH PO) Take 1 tablet by mouth daily.    [provider]  hydrocortisone (ANUSOL-HC) 2.5 % rectal cream Place 1 application rectally 4 (four) times daily. 07/07/18   Tanner, Lyndon Code., PA-C  mirtazapine (REMERON) 15 MG tablet Take 1 tablet (15 mg total) by mouth at bedtime. 07/07/18   Harle Stanford., PA-C  Multiple Vitamin (MULTIVITAMIN) tablet Take 1 tablet by mouth daily.    [provider]  NEOMYCIN-POLYMYXIN-HYDROCORTISONE (CORTISPORIN) 1 % SOLN OTIC solution Place 3 drops into the left ear every 6 (six) hours. 07/07/18   Tanner, Lyndon Code., PA-C  oxyCODONE-acetaminophen (PERCOCET/ROXICET) 5-325 MG tablet Take 1 tablet by mouth every 8 (eight) hours  as needed for severe pain. 07/01/18   Curt Bears, MD  pravastatin (PRAVACHOL) 40 MG tablet TAKE 1 TABLET BY MOUTH EVERY DAY Patient taking differently: Take 40 mg by mouth at bedtime.  05/05/18   [name], MD  prochlorperazine (COMPAZINE) 10 MG tablet Take 1 tablet (10 mg total) by mouth every 6 (six) hours as needed for nausea or vomiting. 05/13/18   Maryanna Shape, NP  senna-docusate (SENNA S) 8.6-50 MG tablet 1 to 2 twice daily for constipation 07/07/18   Sandi Mealy E., PA-C  Wound Dressings  (SONAFINE EX) Apply 1 application topically daily.    [provider]     Vital Signs: Blood pressure 117/81, heart rate 87, respirations 18, O2 sat 100% room air, temperature 98.3   Physical Exam awake/alert.  Chest clear to auscultation bilaterally.  Heart with regular rate and rhythm.  Abdomen soft, positive bowel sounds, nontender.  No significant lower extremity edema.  Imaging: No results found.  Labs:  CBC: Recent Labs    06/16/18 1224 06/23/18 1025 07/01/18 0902 07/07/18 1149  WBC 7.5 11.3* 5.7 9.8  HGB 11.5* 11.4* 11.0* 11.9*  HCT 34.4* 34.9* 32.9* 35.5*  PLT 179 157 146* 117*    COAGS: No results for input(s): INR, APTT in the last 8760 hours.  BMP: Recent Labs    06/16/18 1224 06/23/18 1027 07/01/18 0902 07/07/18 1149  NA 140 143 142 138  K 4.9 4.8 4.2 4.7  CL 105 108 109 104  CO2 29 28 23 28   GLUCOSE 105* 111* 114* 85  BUN 17 15 16 15   CALCIUM 9.6 9.5 9.4 9.7  CREATININE 0.90 0.80 0.78 0.98  GFRNONAA >60 >60 >60 >60  GFRAA >60 >60 >60 >60    LIVER FUNCTION TESTS: Recent Labs    06/16/18 1224 06/23/18 1027 07/01/18 0902 07/07/18 1149  BILITOT 0.4 0.5 0.4 0.6  AST 26 19 17 25   ALT 33 22 19 29   ALKPHOS 95 74 69 103  PROT 7.7 7.8 7.2 7.7  ALBUMIN 3.7 3.9 3.3* 3.7    Assessment and Plan:  Pt with history of metastatic squamous cell carcinoma of the lung ; presents today for Port-A-Cath placement for chemotherapy. Risks and benefits of image guided port-a-catheter placement was discussed with the patient/spouse including, but not limited to bleeding, infection, pneumothorax, or fibrin sheath development and need for additional procedures.  All of the patient's questions were answered, patient is agreeable to proceed. Consent signed and in chart.   LABS PENDING  Electronically Signed: D. Rowe Robert, PA-C 07/11/2018, 8:26 AM   I spent a total of 20 minutes at the the patient's bedside AND on the patient's hospital floor or  unit, greater than 50% of which was counseling/coordinating care for port a cath placement

## 2018-07-13 NOTE — Progress Notes (Signed)
[facility] Progress Note   Travis Padilla [id number] 03/18/1946 72 y.o.  [name] is managed by Dr. Fanny Bien. [name]   Actively treated with chemotherapy/immunotherapy: yes  Current Therapy: Carboplatin, paclitaxel, and Keytruda with PEG filgrastim support  Last Treated: 07/01/2018 (cycle 3)  Assessment: Plan:    Acute otitis externa of left ear, unspecified type - Plan: NEOMYCIN-POLYMYXIN-HYDROCORTISONE (CORTISPORIN) 1 % SOLN OTIC solution  Anorexia - Plan: mirtazapine (REMERON) 15 MG tablet  Insomnia, unspecified type - Plan: mirtazapine (REMERON) 15 MG tablet  Hemorrhoids, unspecified hemorrhoid type - Plan: hydrocortisone (ANUSOL-HC) 2.5 % rectal cream  Constipation, unspecified constipation type - Plan: senna-docusate (SENNA S) 8.6-50 MG tablet  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HCC)   Acute otitis externa of the left ear: The patient was given a prescription for Cortisporin otic solution.  Anorexia and insomnia: The patient was given a prescription for Remeron 15 mg p.o. Nightly.  Hemorrhoids: The patient was given a prescription for Anusol HC cream.  Constipation: The patient was given a prescription for senna S and was instructed to use 1-2 twice daily.  He was also instructed to push fluids.  Stage IV squamous cell carcinoma of the lung: The patient is status post cycle 3 of carboplatin, paclitaxel, and Keytruda with PEG filgrastim support.  He is scheduled to have restaging CT scans completed next week and will follow-up on 07/21/2018.  Please see After Visit Summary for patient specific instructions.  Future Appointments  Date Time Provider [place]  07/14/2018 12:30 PM [facility] 6 [facility] None  07/14/2018  1:30 PM [facility] [place]  07/21/2018  9:00 AM [facility] 6 [facility] None  07/21/2018  9:30 AM Curcio, Roselie Awkward, NP [facility] None  07/22/2018 11:00 AM [facility] INFUSION  [facility] None  07/24/2018 12:00 PM [facility] [place] FLUSH [facility] None  07/28/2018 11:00 AM [facility] LAB 1 [facility] None  08/04/2018 11:00 AM [facility] 5 [facility] None  08/11/2018 11:00 AM [facility] 2 [facility] None  08/11/2018 11:30 AM Curcio, Roselie Awkward, NP [facility] None  08/11/2018 12:30 PM [facility] INFUSION [facility] None  08/18/2018 11:00 AM [facility] LAB 6 [facility] None  08/25/2018 11:00 AM [facility] 6 [facility] None  11/13/2018 10:15 AM [name], MD [facility] None    No orders of the defined types were placed in this encounter.      Subjective:   Patient ID:  Travis Padilla is a 72 y.o. (DOB Oct 27, 1945) male.  Chief Complaint:  Chief Complaint  Patient presents with  . Ear Pain    Left  . Constipation    HPI [name] is a 72 year old male with a history of a stage IV (T3, N0, M1C) non-small cell lung cancer, squamous cell carcinoma who originally presented with a large right infrahilar mass, left upper lobe lung nodule, left axillary mass and left axillary lymphadenopathy when he was diagnosed in August 2019.  He was most recently treated with cycle 3 of carboplatin, paclitaxel, and Keytruda with PEG filgrastim support.  Cycle 3 was dosed on 07/01/2018.  He presents to the office today with report of left ear pain.  He is previously been diagnosed with otitis externa.  He is been using acetic acid otic solution without relief.  He has also had constipation and has had hemorrhoidal swelling and bleeding.  He has been experiencing anorexia and insomnia.  He denies fevers, chills, sweats or a productive cough.  Medications: I have  reviewed the patient's current medications.  Allergies:  Allergies  Allergen Reactions  . Trazodone And Nefazodone Palpitations    Past Medical History:  Diagnosis Date  . Abnormal nuclear stress test    December, 2013  . Anemia   . CAD (coronary artery disease)    Mild nonobstructive  plaque in cath 2013  . Chest pain    December, 2013  . Dizziness   . Gout   . Hemorrhoid   . Hyperlipidemia   . Hypertension   . Skin cancer   . Spinal stenosis     Past Surgical History:  Procedure Laterality Date  . 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 BELPHAROPTOSIS REPAIR Bilateral   . CATARACT EXTRACTION W/ INTRAOCULAR LENS  IMPLANT, BILATERAL    . COLONOSCOPY N/A 11/03/2014   Procedure: COLONOSCOPY;  Surgeon: Rogene Houston, MD;  [facility];  Service: Endoscopy;  Laterality: N/A;  1225  . IR IMAGING GUIDED PORT INSERTION  07/11/2018  . KNEE CARTILAGE SURGERY Left    Left knee  . SKIN CANCER EXCISION  12/2014, 04/25/15  . VIDEO BRONCHOSCOPY Bilateral 05/09/2018   Procedure: VIDEO BRONCHOSCOPY WITH FLUORO;  Surgeon: Juanito Doom, MD;  Location: [facility];  Service: Cardiopulmonary;  Laterality: Bilateral;    Family History  Problem Relation Age of Onset  . Heart attack Father   . Heart failure Mother   . Parkinson's disease Mother   . Healthy Sister   . Skin cancer Sister   . Neuropathy Brother   . COPD Brother   . Epilepsy Brother   . Healthy Sister   . Healthy Sister   . Colon cancer Neg Hx     Social History   Socioeconomic History  . Marital status: Married    Spouse name: Not on file  . Number of children: 1  . Years of education: Not on file  . Highest education level: Not on file  Occupational History  . Occupation: Government social research officer  Social Needs  . Financial resource strain: Not on file  . Food insecurity:    Worry: Not on file    Inability: Not on file  . Transportation needs:    Medical: Not on file    Non-medical: Not on file  Tobacco Use  . Smoking status: Former Smoker    Packs/day: 0.20    Years: 2.00    Pack years: 0.40    Types: Cigarettes    Start date: 10/01/1968  . Smokeless tobacco: Never Used  Substance and Sexual Activity  . Alcohol use: No    Comment: hx of   . Drug use: No  . Sexual activity: Not on  file  Lifestyle  . Physical activity:    Days per week: Not on file    Minutes per session: Not on file  . Stress: Not on file  Relationships  . Social connections:    Talks on phone: Not on file    Gets together: Not on file    Attends religious service: Not on file    Active member of club or organization: Not on file    Attends meetings of clubs or organizations: Not on file    Relationship status: Not on file  . Intimate partner violence:    Fear of current or ex partner: Not on file    Emotionally abused: Not on file    Physically abused: Not on file    Forced sexual activity: Not on file  Other Topics Concern  . Not on file  Social History Narrative   Unable to ask intimate partner violence questions, wife present    Past Medical History, Surgical history, Social history, and Family history were reviewed and updated as appropriate.   Please see review of systems for further details on the patient's review from today.   Review of Systems:  Review of Systems  Constitutional: Positive for appetite change. Negative for chills, diaphoresis, fever and unexpected weight change.  HENT: Positive for ear pain. Negative for trouble swallowing.   Respiratory: Negative for cough, chest tightness and shortness of breath.   Cardiovascular: Negative for chest pain and palpitations.  Gastrointestinal: Positive for anal bleeding and constipation. Negative for abdominal distention, abdominal pain, blood in stool, diarrhea, nausea, rectal pain and vomiting.  Neurological: Negative for dizziness and headaches.    Objective:   Physical Exam:  BP (!) 141/77 (BP Location: Left Arm, Patient Position: Sitting)   Pulse 81   Temp 98.7 F (37.1 C) (Oral)   Resp 18   Ht 6\' 1"  (1.854 m)   Wt 222 lb 9.6 oz (101 kg)   SpO2 100%   BMI 29.37 kg/m  ECOG: 0  Physical Exam  Constitutional: No distress.  HENT:  Head: Normocephalic and atraumatic.  Right Ear: External ear normal.    Mouth/Throat: Oropharynx is clear and moist. No oropharyngeal exudate.  The left ear canal appears erythematous and mildly edematous.  Neck: Normal range of motion. Neck supple.  Cardiovascular: Normal rate, regular rhythm and normal heart sounds. Exam reveals no gallop and no friction rub.  No murmur heard. Pulmonary/Chest: Effort normal and breath sounds normal. No stridor. No respiratory distress. He has no wheezes. He has no rales.  Abdominal: Soft. Bowel sounds are normal. He exhibits no distension and no mass. There is no tenderness. There is no rebound and no guarding.  Lymphadenopathy:    He has no cervical adenopathy.  Neurological: He is alert. Coordination normal.  Skin: Skin is warm and dry. No rash noted. He is not diaphoretic. No erythema.  Psychiatric: He has a normal mood and affect. His behavior is normal. Judgment and thought content normal.    Lab Review:     Component Value Date/Time   NA 139 07/11/2018 0833   NA 144 04/09/2018 1507   K 4.2 07/11/2018 0833   CL 108 07/11/2018 0833   CO2 23 07/11/2018 0833   GLUCOSE 101 (H) 07/11/2018 0833   BUN 11 07/11/2018 0833   BUN 15 04/09/2018 1507   CREATININE 0.80 07/11/2018 0833   CREATININE 0.98 07/07/2018 1149   CALCIUM 8.8 (L) 07/11/2018 0833   PROT 7.2 07/11/2018 0833   PROT 7.2 05/28/2017 1417   ALBUMIN 3.7 07/11/2018 0833   ALBUMIN 4.2 05/28/2017 1417   AST 24 07/11/2018 0833   AST 25 07/07/2018 1149   ALT 30 07/11/2018 0833   ALT 29 07/07/2018 1149   ALKPHOS 79 07/11/2018 0833   BILITOT 0.5 07/11/2018 0833   BILITOT 0.6 07/07/2018 1149   GFRNONAA >60 07/11/2018 0833   GFRNONAA >60 07/07/2018 1149   GFRAA >60 07/11/2018 0833   GFRAA >60 07/07/2018 1149       Component Value Date/Time   WBC 9.5 07/11/2018 0833   RBC 3.62 (L) 07/11/2018 0833   HGB 10.7 (L) 07/11/2018 0833   HGB 11.9 (L) 07/07/2018 1149   HGB 13.0 12/04/2017 1202   HCT 33.2 (L) 07/11/2018 0833   HCT 39.6 12/04/2017 1202  PLT 152  07/11/2018 0833   PLT 117 (L) 07/07/2018 1149   PLT 218 12/04/2017 1202   MCV 91.7 07/11/2018 0833   MCV 92 12/04/2017 1202   MCH 29.6 07/11/2018 0833   MCHC 32.2 07/11/2018 0833   RDW 14.6 07/11/2018 0833   RDW 13.8 12/04/2017 1202   LYMPHSABS 0.8 07/11/2018 0833   LYMPHSABS 1.8 12/04/2017 1202   MONOABS 0.9 07/11/2018 0833   EOSABS 0.3 07/11/2018 0833   EOSABS 0.2 12/04/2017 1202   BASOSABS 0.0 07/07/2018 1149   BASOSABS 0.0 12/04/2017 1202   -------------------------------  Imaging from last 24 hours (if applicable):  Radiology interpretation: Ir Imaging Guided Port Insertion  Result Date: 07/11/2018 CLINICAL DATA:  Metastatic squamous carcinoma of the lung and need for porta cath for continued chemotherapy. EXAM: IMPLANTED PORT A CATH PLACEMENT WITH ULTRASOUND AND FLUOROSCOPIC GUIDANCE ANESTHESIA/SEDATION: 3.0 mg IV Versed; 100 mcg IV Fentanyl Total Moderate Sedation Time:  33 minutes The patient's level of consciousness and physiologic status were continuously monitored during the procedure by Radiology nursing. Additional Medications: 2 g IV Ancef. FLUOROSCOPY TIME:  24 seconds.  6.8 mGy. PROCEDURE: The procedure, risks, benefits, and alternatives were explained to the patient. Questions regarding the procedure were encouraged and answered. The patient understands and consents to the procedure. A time-out was performed prior to initiating the procedure. Ultrasound was utilized to confirm patency of the right internal jugular vein. The right neck and chest were prepped with chlorhexidine in a sterile fashion, and a sterile drape was applied covering the operative field. Maximum barrier sterile technique with sterile gowns and gloves were used for the procedure. Local anesthesia was provided with 1% lidocaine. After creating a small venotomy incision, a 21 gauge needle was advanced into the right internal jugular vein under direct, real-time ultrasound guidance. Ultrasound image  documentation was performed. After securing guidewire access, an 8 Fr dilator was placed. A J-wire was kinked to measure appropriate catheter length. A subcutaneous port pocket was then created along the upper chest wall utilizing sharp and blunt dissection. Portable cautery was utilized. The pocket was irrigated with sterile saline. A single lumen power injectable port was chosen for placement. The 8 Fr catheter was tunneled from the port pocket site to the venotomy incision. The port was placed in the pocket. External catheter was trimmed to appropriate length based on guidewire measurement. At the venotomy, an 8 Fr peel-away sheath was placed over a guidewire. The catheter was then placed through the sheath and the sheath removed. Final catheter positioning was confirmed and documented with a fluoroscopic spot image. The port was accessed with a needle and aspirated and flushed with heparinized saline. The access needle was removed. The venotomy and port pocket incisions were closed with subcutaneous 3-0 Monocryl and subcuticular 4-0 Vicryl. Dermabond was applied to both incisions. COMPLICATIONS: COMPLICATIONS None FINDINGS: After catheter placement, the tip lies at the cavo-atrial junction. The catheter aspirates normally and is ready for immediate use. IMPRESSION: Placement of single lumen port a cath via right internal jugular vein. The catheter tip lies at the cavo-atrial junction. A power injectable port a cath was placed and is ready for immediate use. Electronically Signed   By: Aletta Edouard M.D.   On: 07/11/2018 10:40

## 2018-07-14 ENCOUNTER — Telehealth: Payer: Self-pay | Admitting: Emergency Medicine

## 2018-07-14 ENCOUNTER — Encounter (HOSPITAL_COMMUNITY): Payer: Self-pay

## 2018-07-14 ENCOUNTER — Inpatient Hospital Stay (HOSPITAL_BASED_OUTPATIENT_CLINIC_OR_DEPARTMENT_OTHER): Payer: Medicare Other | Admitting: Medical

## 2018-07-14 ENCOUNTER — Inpatient Hospital Stay: Payer: Medicare Other | Attending: Internal Medicine

## 2018-07-14 ENCOUNTER — Ambulatory Visit (HOSPITAL_COMMUNITY)
Admission: RE | Admit: 2018-07-14 | Discharge: 2018-07-14 | Disposition: A | Discharge status: Home / Self Care | Payer: Medicare Other | Source: Ambulatory Visit | Attending: Internal Medicine | Admitting: Internal Medicine

## 2018-07-14 VITALS — BP 126/79 | HR 78 | Temp 98.2°F | Resp 18 | Ht 72.0 in | Wt 231.8 lb

## 2018-07-14 DIAGNOSIS — Z923 Personal history of irradiation: Secondary | ICD-10-CM

## 2018-07-14 DIAGNOSIS — R590 Localized enlarged lymph nodes: Secondary | ICD-10-CM | POA: Insufficient documentation

## 2018-07-14 DIAGNOSIS — R5381 Other malaise: Secondary | ICD-10-CM | POA: Insufficient documentation

## 2018-07-14 DIAGNOSIS — I10 Essential (primary) hypertension: Secondary | ICD-10-CM | POA: Insufficient documentation

## 2018-07-14 DIAGNOSIS — I2510 Atherosclerotic heart disease of native coronary artery without angina pectoris: Secondary | ICD-10-CM | POA: Diagnosis not present

## 2018-07-14 DIAGNOSIS — Z85828 Personal history of other malignant neoplasm of skin: Secondary | ICD-10-CM | POA: Diagnosis not present

## 2018-07-14 DIAGNOSIS — R5383 Other fatigue: Secondary | ICD-10-CM | POA: Diagnosis not present

## 2018-07-14 DIAGNOSIS — C3491 Malignant neoplasm of unspecified part of right bronchus or lung: Secondary | ICD-10-CM | POA: Insufficient documentation

## 2018-07-14 DIAGNOSIS — Z79899 Other long term (current) drug therapy: Secondary | ICD-10-CM | POA: Diagnosis not present

## 2018-07-14 DIAGNOSIS — H6092 Unspecified otitis externa, left ear: Secondary | ICD-10-CM

## 2018-07-14 DIAGNOSIS — N200 Calculus of kidney: Secondary | ICD-10-CM | POA: Diagnosis not present

## 2018-07-14 DIAGNOSIS — Z7689 Persons encountering health services in other specified circumstances: Secondary | ICD-10-CM | POA: Diagnosis not present

## 2018-07-14 DIAGNOSIS — Z87891 Personal history of nicotine dependence: Secondary | ICD-10-CM

## 2018-07-14 DIAGNOSIS — H60502 Unspecified acute noninfective otitis externa, left ear: Secondary | ICD-10-CM

## 2018-07-14 DIAGNOSIS — E785 Hyperlipidemia, unspecified: Secondary | ICD-10-CM | POA: Insufficient documentation

## 2018-07-14 DIAGNOSIS — C773 Secondary and unspecified malignant neoplasm of axilla and upper limb lymph nodes: Secondary | ICD-10-CM | POA: Insufficient documentation

## 2018-07-14 DIAGNOSIS — C3490 Malignant neoplasm of unspecified part of unspecified bronchus or lung: Secondary | ICD-10-CM | POA: Insufficient documentation

## 2018-07-14 DIAGNOSIS — Z5112 Encounter for antineoplastic immunotherapy: Secondary | ICD-10-CM | POA: Diagnosis not present

## 2018-07-14 DIAGNOSIS — I700 Atherosclerosis of aorta: Secondary | ICD-10-CM | POA: Insufficient documentation

## 2018-07-14 DIAGNOSIS — Z5111 Encounter for antineoplastic chemotherapy: Secondary | ICD-10-CM

## 2018-07-14 LAB — CBC WITH DIFFERENTIAL (CANCER CENTER ONLY)
Abs Immature Granulocytes: 0.11 10*3/uL — ABNORMAL HIGH (ref 0.00–0.07)
Basophils Absolute: 0 10*3/uL (ref 0.0–0.1)
Basophils Relative: 0 %
EOS ABS: 0.4 10*3/uL (ref 0.0–0.5)
EOS PCT: 3 %
HEMATOCRIT: 36.7 % — AB (ref 39.0–52.0)
Hemoglobin: 12 g/dL — ABNORMAL LOW (ref 13.0–17.0)
Immature Granulocytes: 1 %
LYMPHS ABS: 1 10*3/uL (ref 0.7–4.0)
Lymphocytes Relative: 8 %
MCH: 29.6 pg (ref 26.0–34.0)
MCHC: 32.7 g/dL (ref 30.0–36.0)
MCV: 90.6 fL (ref 80.0–100.0)
Monocytes Absolute: 0.9 10*3/uL (ref 0.1–1.0)
Monocytes Relative: 8 %
Neutro Abs: 9.6 10*3/uL — ABNORMAL HIGH (ref 1.7–7.7)
Neutrophils Relative %: 80 %
Platelet Count: 157 10*3/uL (ref 150–400)
RBC: 4.05 MIL/uL — ABNORMAL LOW (ref 4.22–5.81)
RDW: 14.8 % (ref 11.5–15.5)
WBC Count: 12.1 10*3/uL — ABNORMAL HIGH (ref 4.0–10.5)
nRBC: 0 % (ref 0.0–0.2)

## 2018-07-14 LAB — CMP (CANCER CENTER ONLY)
ALBUMIN: 3.7 g/dL (ref 3.5–5.0)
ALK PHOS: 104 U/L (ref 38–126)
ALT: 24 U/L (ref 0–44)
AST: 22 U/L (ref 15–41)
Anion gap: 9 (ref 5–15)
BUN: 12 mg/dL (ref 8–23)
CALCIUM: 9.7 mg/dL (ref 8.9–10.3)
CO2: 26 mmol/L (ref 22–32)
CREATININE: 0.98 mg/dL (ref 0.61–1.24)
Chloride: 105 mmol/L (ref 98–111)
GFR, Est AFR Am: >60 mL/min (ref >60)
GFR, Estimated: >60 mL/min (ref >60)
Glucose, Bld: 93 mg/dL (ref 70–99)
Potassium: 4.7 mmol/L (ref 3.5–5.1)
SODIUM: 140 mmol/L (ref 135–145)
Total Bilirubin: 0.4 mg/dL (ref 0.3–1.2)
Total Protein: 7.8 g/dL (ref 6.5–8.1)

## 2018-07-14 MED ORDER — CIPROFLOXACIN-HYDROCORTISONE 0.2-1 % OT SUSP: 3.0000 [drp] | Freq: Two times a day (BID) | OTIC | 0 refills | Status: DC

## 2018-07-14 MED ORDER — IOHEXOL 300 MG/ML  SOLN
100.0000 mL | Freq: Once | INTRAMUSCULAR | Status: AC | PRN
  Administered 2018-07-14: 100 mL via INTRAVENOUS

## 2018-07-14 MED ORDER — SODIUM CHLORIDE 0.9 % IJ SOLN
INTRAMUSCULAR | Status: AC
  Filled 2018-07-14: qty 50

## 2018-07-14 NOTE — Telephone Encounter (Signed)
Returning pt's VM asking to be seen in [facility] for continuing ear pain.  Pt to arrive for appt at 1130 then to have labs and CT scan afterwards per his original schedule.  Pt verbalized understanding.

## 2018-07-14 NOTE — Patient Instructions (Signed)
Ear Drops, Adult Your doctor has found that you have a condition that requires you to use ear drops. Ear drops are a medicine that is placed in the ear. This sheet gives you information about how to use this medicine. Your doctor may also give you more specific instructions. Supplies needed:  Cotton ball.  Medicine. How to put ear drops into your ear 1. Wash your hands with soap and water. 2. Make sure your ears are clean and dry. 3. Warm the medicine by holding it in your hand for a few minutes. 4. Shake the medicine to mix the ear drops. 5. Use the tube to get the medicine. You will need to squeeze the round part of the tube to do this. 6. Put the drops in your ear as told. Hold the tube above your ear. Do not let the tube touch your ear. The medicine may go in easier if you pull the flap of your ear up and back while you put the drops in. 7. To make sure your ear soaks up the medicine, do one of these things: ? Lie down for 10 minutes. The ear with the medicine should face up. ? Put a cotton ball in your ear. Do not push it deeper into your ear. Take out the cotton ball when the drops have been soaked up. 8. If you need to put drops in your other ear, repeat the same steps. Your doctor will tell you if you should put drops in both ears. Follow these instructions at home:  Use the ear drops for as long as your doctor tells you to. Do not stop even if your symptoms get better.  Keep the ear drops at room temperature.  Keep all follow-up visits as told by your doctor. This is important. Contact a doctor if:  Your condition gets worse.  Your pain gets worse.  Unusual fluid (drainage) is coming from your ear, especially if the fluid stinks.  You have trouble hearing. Get help right away if:  You feel like the room is spinning and you feel sick to your stomach. This condition is called vertigo.  The outside of your ear becomes red or swollen.  You have a very bad  headache. Summary  Ear drops are a medicine that is put in the ear.  Put the drops in your ear as told by your doctor.  Use the ear drops for as long as your doctor tells you to. Do not stop even if your symptoms get better. This information is not intended to replace advice given to you by your health care provider. Make sure you discuss any questions you have with your health care provider. Document Released: 02/14/2010 Document Revised: 08/31/2016 Document Reviewed: 08/31/2016 Elsevier Interactive Patient Education  2017 [employer].

## 2018-07-15 ENCOUNTER — Other Ambulatory Visit: Payer: Medicare Other

## 2018-07-15 ENCOUNTER — Telehealth: Payer: Self-pay | Admitting: Medical

## 2018-07-15 NOTE — Telephone Encounter (Signed)
No los per 11/4

## 2018-07-17 NOTE — Progress Notes (Signed)
[facility] Progress Note   Travis Padilla [id number] [birth year] 72 y.o.  Travis Padilla  is managed by Dr. Fanny Bien. Travis Padilla   Actively treated with chemotherapy/immunotherapy: yes  Current Therapy: Carboplatin, paclitaxel, and Keytruda with PEG filgrastim support  Last Treated: 07/01/2018 (cycle 3)  Assessment: Plan:    Otitis externa of left ear, unspecified chronicity, unspecified type - Plan: ciprofloxacin-hydrocortisone (CIPRO HC) OTIC suspension  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HCC)   Acute otitis externa of the left ear: The patient was told to discontinue Cortisporin otic solution.  He was given a prescription for Cipro HC otic.  Stage IV squamous cell carcinoma of the lung: The patient is status post cycle 3 of carboplatin, paclitaxel, and Keytruda with PEG filgrastim support.  He is scheduled to have restaging CT scans completed next week and will follow-up on 07/21/2018.  Please see After Visit Summary for patient specific instructions.  Future Appointments  Date Time Provider [place]  07/21/2018  9:00 AM [facility] LAB 6 [facility] None  07/21/2018  9:30 AM Maryanna Shape, NP [facility] None  07/22/2018 11:00 AM [facility] INFUSION [facility] None  07/24/2018 12:00 PM [facility] FLUSH [facility] None  07/28/2018 11:00 AM [facility] LAB 1 [facility] None  08/04/2018 11:00 AM [facility] LAB 5 [facility] None  08/11/2018 11:00 AM [facility] LAB 2 [facility] None  08/11/2018 11:30 AM Curcio, Roselie Awkward, NP [facility] None  08/11/2018 12:30 PM [facility] INFUSION [facility] None  08/18/2018 11:00 AM [facility] LAB 6 [facility] None  08/25/2018 11:00 AM [facility] LAB 6 [facility] None  11/13/2018 10:15 AM [name], MD [facility] None    No orders of the defined types were placed in this encounter.      Subjective:   Patient ID:  Travis Padilla is a 72 y.o. (DOB [birth year]) male.  Chief  Complaint:  Chief Complaint  Patient presents with  . Ear Pain    HPI Travis Padilla is a 72 year old male with a history of a stage IV (T3, N0, M1C) non-small cell lung cancer, squamous cell carcinoma. He was originally was diagnosed in August 2019. At that time, he presented with a large right infrahilar mass, left upper lobe lung nodule, left axillary mass and left axillary lymphadenopathy.  He is status post cycle 3 of carboplatin, paclitaxel, and Keytruda with PEG filgrastim support which was dosed on 07/01/2018.  He presents to the office today having last been seen on 07/07/2018. He continues to have left ear pain despite being treated with Cortisporin Otic.  He continues to use acetic acid otic solution.  He denies fevers, chills, sweats or a productive cough.  Medications: I have reviewed the patient's current medications.  Allergies:  Allergies  Allergen Reactions  . Trazodone And Nefazodone Palpitations    Past Medical History:  Diagnosis Date  . Abnormal nuclear stress test    December, 2013  . Anemia   . CAD (coronary artery disease)    Mild nonobstructive plaque in cath 2013  . Chest pain    December, 2013  . Dizziness   . Gout   . Hemorrhoid   . Hyperlipidemia   . Hypertension   . Skin cancer   . Spinal stenosis     Past Surgical History:  Procedure Laterality Date  . 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 BELPHAROPTOSIS REPAIR Bilateral   . CATARACT EXTRACTION W/ INTRAOCULAR LENS  IMPLANT, BILATERAL    . COLONOSCOPY N/A  11/03/2014   Procedure: COLONOSCOPY;  Surgeon: Rogene Houston, MD;  Location: [facility];  Service: Endoscopy;  Laterality: N/A;  1225  . IR IMAGING GUIDED PORT INSERTION  07/11/2018  . KNEE CARTILAGE SURGERY Left    Left knee  . SKIN CANCER EXCISION  12/2014, 04/25/15  . VIDEO BRONCHOSCOPY Bilateral 05/09/2018   Procedure: VIDEO BRONCHOSCOPY WITH FLUORO;  Surgeon: Juanito Doom, MD;  Location: [facility];  Service: Cardiopulmonary;   Laterality: Bilateral;    Family History  Problem Relation Age of Onset  . Heart attack Father   . Heart failure Mother   . Parkinson's disease Mother   . Healthy Sister   . Skin cancer Sister   . Neuropathy Brother   . COPD Brother   . Epilepsy Brother   . Healthy Sister   . Healthy Sister   . Colon cancer Neg Hx     Social History   Socioeconomic History  . Marital status: Married    Spouse name: Not on file  . Number of children: 1  . Years of education: Not on file  . Highest education level: Not on file  Occupational History  . Occupation: Government social research officer  Social Needs  . Financial resource strain: Not on file  . Food insecurity:    Worry: Not on file    Inability: Not on file  . Transportation needs:    Medical: Not on file    Non-medical: Not on file  Tobacco Use  . Smoking status: Former Smoker    Packs/day: 0.20    Years: 2.00    Pack years: 0.40    Types: Cigarettes    Start date: 10/01/1968  . Smokeless tobacco: Never Used  Substance and Sexual Activity  . Alcohol use: No    Comment: hx of   . Drug use: No  . Sexual activity: Not on file  Lifestyle  . Physical activity:    Days per week: Not on file    Minutes per session: Not on file  . Stress: Not on file  Relationships  . Social connections:    Talks on phone: Not on file    Gets together: Not on file    Attends religious service: Not on file    Active member of club or organization: Not on file    Attends meetings of clubs or organizations: Not on file    Relationship status: Not on file  . Intimate partner violence:    Fear of current or ex partner: Not on file    Emotionally abused: Not on file    Physically abused: Not on file    Forced sexual activity: Not on file  Other Topics Concern  . Not on file  Social History Narrative   Unable to ask intimate partner violence questions, wife present    Past Medical History, Surgical history, Social history, and Family history were  reviewed and updated as appropriate.   Please see review of systems for further details on the patient's review from today.   Review of Systems:  Review of Systems  Constitutional: Negative for chills, diaphoresis, fatigue and fever.  HENT: Positive for ear pain. Negative for congestion, ear discharge, postnasal drip, rhinorrhea and sore throat.   Respiratory: Negative for cough, shortness of breath and wheezing.   Cardiovascular: Negative for palpitations.  Neurological: Negative for headaches.    Objective:   Physical Exam:  BP 126/79 (BP Location: Left Arm, Patient Position: Sitting)   Pulse 78  Temp 98.2 F (36.8 C) (Oral)   Resp 18   Ht 6' (1.829 m)   Wt 231 lb 12.8 oz (105.1 kg)   SpO2 98%   BMI 31.44 kg/m  ECOG: 0  Physical Exam  Constitutional: No distress.  HENT:  Head: Normocephalic and atraumatic.  Right Ear: External ear normal.  Mouth/Throat: Oropharynx is clear and moist. No oropharyngeal exudate.  The previously noted erythema in the right auditory canal is resolving.  There continues to be slight edema.  Neurological: He is alert. He displays normal reflexes.  Skin: Skin is warm and dry. He is not diaphoretic.  Psychiatric: He has a normal mood and affect. His behavior is normal. Judgment and thought content normal.    Lab Review:     Component Value Date/Time   NA 140 07/14/2018 1257   NA 144 04/09/2018 1507   K 4.7 07/14/2018 1257   CL 105 07/14/2018 1257   CO2 26 07/14/2018 1257   GLUCOSE 93 07/14/2018 1257   BUN 12 07/14/2018 1257   BUN 15 04/09/2018 1507   CREATININE 0.98 07/14/2018 1257   CALCIUM 9.7 07/14/2018 1257   PROT 7.8 07/14/2018 1257   PROT 7.2 05/28/2017 1417   ALBUMIN 3.7 07/14/2018 1257   ALBUMIN 4.2 05/28/2017 1417   AST 22 07/14/2018 1257   ALT 24 07/14/2018 1257   ALKPHOS 104 07/14/2018 1257   BILITOT 0.4 07/14/2018 1257   GFRNONAA >60 07/14/2018 1257   GFRAA >60 07/14/2018 1257       Component Value Date/Time    WBC 12.1 (H) 07/14/2018 1257   WBC 9.5 07/11/2018 0833   RBC 4.05 (L) 07/14/2018 1257   HGB 12.0 (L) 07/14/2018 1257   HGB 13.0 12/04/2017 1202   HCT 36.7 (L) 07/14/2018 1257   HCT 39.6 12/04/2017 1202   PLT 157 07/14/2018 1257   PLT 218 12/04/2017 1202   MCV 90.6 07/14/2018 1257   MCV 92 12/04/2017 1202   MCH 29.6 07/14/2018 1257   MCHC 32.7 07/14/2018 1257   RDW 14.8 07/14/2018 1257   RDW 13.8 12/04/2017 1202   LYMPHSABS 1.0 07/14/2018 1257   LYMPHSABS 1.8 12/04/2017 1202   MONOABS 0.9 07/14/2018 1257   EOSABS 0.4 07/14/2018 1257   EOSABS 0.2 12/04/2017 1202   BASOSABS 0.0 07/14/2018 1257   BASOSABS 0.0 12/04/2017 1202   -------------------------------  Imaging from last 24 hours (if applicable):  Radiology interpretation: Ct Chest W Contrast  Result Date: 07/14/2018 CLINICAL DATA:  Non-small-cell lung cancer. EXAM: CT CHEST, ABDOMEN, AND PELVIS WITH CONTRAST TECHNIQUE: Multidetector CT imaging of the chest, abdomen and pelvis was performed following the standard protocol during bolus administration of intravenous contrast. CONTRAST:  15mL OMNIPAQUE IOHEXOL 300 MG/ML  SOLN COMPARISON:  PET-CT 05/05/2018. FINDINGS: CT CHEST FINDINGS Cardiovascular: The heart size is normal. No substantial pericardial effusion. Coronary artery calcification is evident. Atherosclerotic calcification is noted in the wall of the thoracic aorta. Right Port-A-Cath tip is positioned in the distal SVC. Mediastinum/Nodes: No mediastinal lymphadenopathy. Right infrahilar mass measured previously at 3.4 x 3.4 cm now measures 2.2 x 2.9 cm (38/2). No left hilar lymphadenopathy. No right axillary lymphadenopathy. The left axillary lesion measures 2.2 x 1.5 cm today compared to 2.9 x 2.3 cm previously. Lungs/Pleura: Left upper lobe nodule measures 1.9 cm today (79/7) compared to 2.2 cm previously. Interval progression of right infrahilar volume loss and airspace disease, likely related to radiation therapy. No  pleural effusion. Musculoskeletal: No worrisome lytic or sclerotic osseous abnormality.  CT ABDOMEN PELVIS FINDINGS Hepatobiliary: 1.7 cm cyst identified posterior left liver, stable. There is no evidence for gallstones, gallbladder wall thickening, or pericholecystic fluid. No intrahepatic or extrahepatic biliary dilation. Pancreas: No focal mass lesion. No dilatation of the Padilla duct. No intraparenchymal cyst. No peripancreatic edema. Spleen: No splenomegaly. No focal mass lesion. Adrenals/Urinary Tract: No adrenal nodule or mass. Tiny bilateral nonobstructing renal stones again identified. No evidence for hydroureter. The urinary bladder appears normal for the degree of distention. Stomach/Bowel: Stomach is nondistended. No gastric wall thickening. No evidence of outlet obstruction. Duodenum is normally positioned as is the ligament of Treitz. No small bowel wall thickening. No small bowel dilatation. The terminal ileum is normal. The appendix is normal. Diverticular changes are noted in the left colon without evidence of diverticulitis. Vascular/Lymphatic: There is abdominal aortic atherosclerosis without aneurysm. There is no gastrohepatic or hepatoduodenal ligament lymphadenopathy. No intraperitoneal or retroperitoneal lymphadenopathy. No pelvic sidewall lymphadenopathy. Reproductive: Prostate gland upper normal for size. Other: No intraperitoneal free fluid. Musculoskeletal: No worrisome lytic or sclerotic osseous abnormality. IMPRESSION: 1. Interval decrease in size of the right infrahilar lesion and left axillary lymphadenopathy. 2. Slight interval decrease in the left upper lobe pulmonary nodule. 3. No new or progressive interval findings. 4. Bilateral nonobstructing renal stones. 5.  Aortic Atherosclerois (ICD10-170.0) Electronically Signed   By: Misty Stanley M.D.   On: 07/14/2018 14:09   Ct Abdomen Pelvis W Contrast  Result Date: 07/14/2018 CLINICAL DATA:  Non-small-cell lung cancer. EXAM: CT CHEST,  ABDOMEN, AND PELVIS WITH CONTRAST TECHNIQUE: Multidetector CT imaging of the chest, abdomen and pelvis was performed following the standard protocol during bolus administration of intravenous contrast. CONTRAST:  136mL OMNIPAQUE IOHEXOL 300 MG/ML  SOLN COMPARISON:  PET-CT 05/05/2018. FINDINGS: CT CHEST FINDINGS Cardiovascular: The heart size is normal. No substantial pericardial effusion. Coronary artery calcification is evident. Atherosclerotic calcification is noted in the wall of the thoracic aorta. Right Port-A-Cath tip is positioned in the distal SVC. Mediastinum/Nodes: No mediastinal lymphadenopathy. Right infrahilar mass measured previously at 3.4 x 3.4 cm now measures 2.2 x 2.9 cm (38/2). No left hilar lymphadenopathy. No right axillary lymphadenopathy. The left axillary lesion measures 2.2 x 1.5 cm today compared to 2.9 x 2.3 cm previously. Lungs/Pleura: Left upper lobe nodule measures 1.9 cm today (79/7) compared to 2.2 cm previously. Interval progression of right infrahilar volume loss and airspace disease, likely related to radiation therapy. No pleural effusion. Musculoskeletal: No worrisome lytic or sclerotic osseous abnormality. CT ABDOMEN PELVIS FINDINGS Hepatobiliary: 1.7 cm cyst identified posterior left liver, stable. There is no evidence for gallstones, gallbladder wall thickening, or pericholecystic fluid. No intrahepatic or extrahepatic biliary dilation. Pancreas: No focal mass lesion. No dilatation of the Padilla duct. No intraparenchymal cyst. No peripancreatic edema. Spleen: No splenomegaly. No focal mass lesion. Adrenals/Urinary Tract: No adrenal nodule or mass. Tiny bilateral nonobstructing renal stones again identified. No evidence for hydroureter. The urinary bladder appears normal for the degree of distention. Stomach/Bowel: Stomach is nondistended. No gastric wall thickening. No evidence of outlet obstruction. Duodenum is normally positioned as is the ligament of Treitz. No small bowel  wall thickening. No small bowel dilatation. The terminal ileum is normal. The appendix is normal. Diverticular changes are noted in the left colon without evidence of diverticulitis. Vascular/Lymphatic: There is abdominal aortic atherosclerosis without aneurysm. There is no gastrohepatic or hepatoduodenal ligament lymphadenopathy. No intraperitoneal or retroperitoneal lymphadenopathy. No pelvic sidewall lymphadenopathy. Reproductive: Prostate gland upper normal for size. Other: No intraperitoneal free fluid. Musculoskeletal:  No worrisome lytic or sclerotic osseous abnormality. IMPRESSION: 1. Interval decrease in size of the right infrahilar lesion and left axillary lymphadenopathy. 2. Slight interval decrease in the left upper lobe pulmonary nodule. 3. No new or progressive interval findings. 4. Bilateral nonobstructing renal stones. 5.  Aortic Atherosclerois (ICD10-170.0) Electronically Signed   By: Misty Stanley M.D.   On: 07/14/2018 14:09   Ir Imaging Guided Port Insertion  Result Date: 07/11/2018 CLINICAL DATA:  Metastatic squamous carcinoma of the lung and need for porta cath for continued chemotherapy. EXAM: IMPLANTED PORT A CATH PLACEMENT WITH ULTRASOUND AND FLUOROSCOPIC GUIDANCE ANESTHESIA/SEDATION: 3.0 mg IV Versed; 100 mcg IV Fentanyl Total Moderate Sedation Time:  33 minutes The patient's level of consciousness and physiologic status were continuously monitored during the procedure by Radiology nursing. Additional Medications: 2 g IV Ancef. FLUOROSCOPY TIME:  24 seconds.  6.8 mGy. PROCEDURE: The procedure, risks, benefits, and alternatives were explained to the patient. Questions regarding the procedure were encouraged and answered. The patient understands and consents to the procedure. A time-out was performed prior to initiating the procedure. Ultrasound was utilized to confirm patency of the right internal jugular vein. The right neck and chest were prepped with chlorhexidine in a sterile fashion,  and a sterile drape was applied covering the operative field. Maximum barrier sterile technique with sterile gowns and gloves were used for the procedure. Local anesthesia was provided with 1% lidocaine. After creating a small venotomy incision, a 21 gauge needle was advanced into the right internal jugular vein under direct, real-time ultrasound guidance. Ultrasound image documentation was performed. After securing guidewire access, an 8 Fr dilator was placed. A J-wire was kinked to measure appropriate catheter length. A subcutaneous port pocket was then created along the upper chest wall utilizing sharp and blunt dissection. Portable cautery was utilized. The pocket was irrigated with sterile saline. A single lumen power injectable port was chosen for placement. The 8 Fr catheter was tunneled from the port pocket site to the venotomy incision. The port was placed in the pocket. External catheter was trimmed to appropriate length based on guidewire measurement. At the venotomy, an 8 Fr peel-away sheath was placed over a guidewire. The catheter was then placed through the sheath and the sheath removed. Final catheter positioning was confirmed and documented with a fluoroscopic spot image. The port was accessed with a needle and aspirated and flushed with heparinized saline. The access needle was removed. The venotomy and port pocket incisions were closed with subcutaneous 3-0 Monocryl and subcuticular 4-0 Vicryl. Dermabond was applied to both incisions. COMPLICATIONS: COMPLICATIONS None FINDINGS: After catheter placement, the tip lies at the cavo-atrial junction. The catheter aspirates normally and is ready for immediate use. IMPRESSION: Placement of single lumen port a cath via right internal jugular vein. The catheter tip lies at the cavo-atrial junction. A power injectable port a cath was placed and is ready for immediate use. Electronically Signed   By: Aletta Edouard M.D.   On: 07/11/2018 10:40

## 2018-07-21 ENCOUNTER — Ambulatory Visit (HOSPITAL_COMMUNITY): Payer: Medicare Other

## 2018-07-21 ENCOUNTER — Telehealth: Payer: Self-pay | Admitting: Oncology

## 2018-07-21 ENCOUNTER — Inpatient Hospital Stay (HOSPITAL_BASED_OUTPATIENT_CLINIC_OR_DEPARTMENT_OTHER): Payer: Medicare Other | Admitting: Oncology

## 2018-07-21 ENCOUNTER — Encounter: Payer: Self-pay | Admitting: Oncology

## 2018-07-21 ENCOUNTER — Inpatient Hospital Stay: Payer: Medicare Other

## 2018-07-21 VITALS — BP 114/72 | HR 73 | Temp 97.9°F | Resp 18 | Ht 72.0 in | Wt 231.0 lb

## 2018-07-21 DIAGNOSIS — I2510 Atherosclerotic heart disease of native coronary artery without angina pectoris: Secondary | ICD-10-CM

## 2018-07-21 DIAGNOSIS — Z79899 Other long term (current) drug therapy: Secondary | ICD-10-CM

## 2018-07-21 DIAGNOSIS — I10 Essential (primary) hypertension: Secondary | ICD-10-CM | POA: Diagnosis not present

## 2018-07-21 DIAGNOSIS — R5383 Other fatigue: Secondary | ICD-10-CM

## 2018-07-21 DIAGNOSIS — C3491 Malignant neoplasm of unspecified part of right bronchus or lung: Secondary | ICD-10-CM | POA: Diagnosis not present

## 2018-07-21 DIAGNOSIS — Z5112 Encounter for antineoplastic immunotherapy: Secondary | ICD-10-CM

## 2018-07-21 DIAGNOSIS — Z87891 Personal history of nicotine dependence: Secondary | ICD-10-CM | POA: Diagnosis not present

## 2018-07-21 DIAGNOSIS — Z7689 Persons encountering health services in other specified circumstances: Secondary | ICD-10-CM | POA: Diagnosis not present

## 2018-07-21 DIAGNOSIS — Z5111 Encounter for antineoplastic chemotherapy: Secondary | ICD-10-CM

## 2018-07-21 DIAGNOSIS — Z85828 Personal history of other malignant neoplasm of skin: Secondary | ICD-10-CM | POA: Diagnosis not present

## 2018-07-21 DIAGNOSIS — Z923 Personal history of irradiation: Secondary | ICD-10-CM

## 2018-07-21 DIAGNOSIS — E785 Hyperlipidemia, unspecified: Secondary | ICD-10-CM

## 2018-07-21 DIAGNOSIS — R590 Localized enlarged lymph nodes: Secondary | ICD-10-CM | POA: Diagnosis not present

## 2018-07-21 DIAGNOSIS — C773 Secondary and unspecified malignant neoplasm of axilla and upper limb lymph nodes: Secondary | ICD-10-CM | POA: Diagnosis not present

## 2018-07-21 LAB — CBC WITH DIFFERENTIAL (CANCER CENTER ONLY)
Abs Immature Granulocytes: 0.04 10*3/uL (ref 0.00–0.07)
BASOS PCT: 1 %
Basophils Absolute: 0 10*3/uL (ref 0.0–0.1)
EOS PCT: 3 %
Eosinophils Absolute: 0.2 10*3/uL (ref 0.0–0.5)
HEMATOCRIT: 34.8 % — AB (ref 39.0–52.0)
Hemoglobin: 11.1 g/dL — ABNORMAL LOW (ref 13.0–17.0)
IMMATURE GRANULOCYTES: 1 %
Lymphocytes Relative: 12 %
Lymphs Abs: 0.7 10*3/uL (ref 0.7–4.0)
MCH: 29.4 pg (ref 26.0–34.0)
MCHC: 31.9 g/dL (ref 30.0–36.0)
MCV: 92.3 fL (ref 80.0–100.0)
MONO ABS: 0.9 10*3/uL (ref 0.1–1.0)
Monocytes Relative: 17 %
NEUTROS ABS: 3.7 10*3/uL (ref 1.7–7.7)
Neutrophils Relative %: 66 %
Platelet Count: 168 10*3/uL (ref 150–400)
RBC: 3.77 MIL/uL — ABNORMAL LOW (ref 4.22–5.81)
RDW: 15.6 % — AB (ref 11.5–15.5)
WBC: 5.5 10*3/uL (ref 4.0–10.5)
nRBC: 0 % (ref 0.0–0.2)

## 2018-07-21 LAB — CMP (CANCER CENTER ONLY)
ALT: 18 U/L (ref 0–44)
AST: 20 U/L (ref 15–41)
Albumin: 3.6 g/dL (ref 3.5–5.0)
Alkaline Phosphatase: 76 U/L (ref 38–126)
Anion gap: 7 (ref 5–15)
BILIRUBIN TOTAL: 0.5 mg/dL (ref 0.3–1.2)
BUN: 13 mg/dL (ref 8–23)
CO2: 26 mmol/L (ref 22–32)
CREATININE: 0.92 mg/dL (ref 0.61–1.24)
Calcium: 9.5 mg/dL (ref 8.9–10.3)
Chloride: 109 mmol/L (ref 98–111)
GFR, Est AFR Am: >60 mL/min (ref >60)
Glucose, Bld: 101 mg/dL — ABNORMAL HIGH (ref 70–99)
POTASSIUM: 4.6 mmol/L (ref 3.5–5.1)
Sodium: 142 mmol/L (ref 135–145)
Total Protein: 7.3 g/dL (ref 6.5–8.1)

## 2018-07-21 LAB — TSH: TSH: 13.9 u[IU]/mL — ABNORMAL HIGH (ref 0.320–4.118)

## 2018-07-21 MED ORDER — LIDOCAINE-PRILOCAINE 2.5-2.5 % EX CREA: 1.0000 "application " | TOPICAL_CREAM | CUTANEOUS | 0 refills | Status: DC | PRN

## 2018-07-21 NOTE — Telephone Encounter (Signed)
Scheduled appt per 11/11 los - pt to get an updated schedule next visit.   

## 2018-07-21 NOTE — Assessment & Plan Note (Addendum)
This is a very pleasant 72 year old white male with very light most smoking history recently diagnosed with stage IV (T3, N0, M1c) non-small cell lung cancer, squamous cell carcinoma based on the biopsy from the left axillary mass. The patient is currently undergoing systemic chemotherapy with carboplatin, paclitaxel and Keytruda status post 3 cycles.  Has been tolerating this treatment well with no concerning complaints except for generalized fatigue and weakness and also aching pain after the Neulasta injection.  He had a restaging CT scan of the chest, abdomen, pelvis and is here to discuss the results.  The patient was seen with Dr. Earlie Server.  CT scan results were discussed with the patient his wife which showed a decrease in the size of the right infrahilar lesion, left axillary lymph node, and left upper lobe pulmonary nodule.  Recommend for him to continue carboplatin, Taxol, and Keytruda and then change to maintenance therapy with Keytruda only beginning with cycle #5.  He will proceed with cycle 4 of his treatment tomorrow as scheduled.  He will continue to have weekly labs until he begins maintenance Keytruda.  Follow-up visit will be in 3 weeks for evaluation prior to cycle 5 of his treatment.  For pain related to Neulasta, continue Claritin and oxycodone as needed.  The patient was advised to call immediately if he has any concerning symptoms in the interval. The patient voices understanding of current disease status and treatment options and is in agreement with the current care plan. All questions were answered. The patient knows to call the clinic with any problems, questions or concerns. We can certainly see the patient much sooner if necessary.

## 2018-07-21 NOTE — Progress Notes (Signed)
Travis Padilla  Travis Maize, MD [place] [address]  DIAGNOSIS: Stage IV (T3, N0, M1C) non-small cell lung cancer, squamous cell carcinoma presented with large right infrahilar mass in addition to left upper lobe lung nodule as well as left axillary mass with left axillary lymph node diagnosed in August 2019.  PRIOR THERAPY: Palliative radiotherapy to the right infrahilar mass as well as the axillary mass under the care of Dr. Lisbeth Renshaw.  CURRENT THERAPY: Systemic chemotherapy with carboplatin for AUC of 5, paclitaxel 175 mg/M2 and Keytruda 200 mg IV every 3 weeks status post 3 cycles..  INTERVAL HISTORY: Travis Padilla 72 y.o. male returns for routine follow-up visit accompanied by his wife.  The patient is feeling fine today and has no specific complaints except for generalized fatigue and weakness.  He also experiences arthralgias following his Neulasta injection.  He is using Claritin and has oxycodone to use as needed.  He denies fevers and chills.  Denies chest pain, shortness breath, cough, hemoptysis.  Denies nausea, vomiting, constipation, diarrhea.  Denies recent weight loss or night sweats.  He had a restaging CT scan and is here to discuss the results.  MEDICAL HISTORY: Past Medical History:  Diagnosis Date  . Abnormal nuclear stress test    December, 2013  . Anemia   . CAD (coronary artery disease)    Mild nonobstructive plaque in cath 2013  . Chest pain    December, 2013  . Dizziness   . Gout   . Hemorrhoid   . Hyperlipidemia   . Hypertension   . Skin cancer   . Spinal stenosis     ALLERGIES:  is allergic to trazodone and nefazodone.  MEDICATIONS:  Current Outpatient Medications  Medication Sig Dispense Refill  . acetaminophen (TYLENOL) 500 MG tablet Take 1,000 mg by mouth every 6 (six) hours as needed.    . Artificial Tear Solution (SOOTHE XP OP) Place 1 drop into both eyes 2 (two) times daily.    . carvedilol  (COREG) 12.5 MG tablet TAKE 1 AND 1/2 TABLETS BY MOUTH TWO TIMES DAILY WITH A MEAL (Patient taking differently: Take 18.75 mg by mouth 2 (two) times daily with a meal. ) 270 tablet 3  . ciprofloxacin-hydrocortisone (CIPRO HC) OTIC suspension Place 3 drops into the left ear 2 (two) times daily. 10 mL 0  . clindamycin (CLINDAGEL) 1 % gel Apply topically 2 (two) times daily as needed. 30 g 0  . hydrocortisone (ANUSOL-HC) 2.5 % rectal cream Place 1 application rectally 4 (four) times daily. 30 g 3  . mirtazapine (REMERON) 15 MG tablet Take 1 tablet (15 mg total) by mouth at bedtime. 30 tablet 5  . Multiple Vitamin (MULTIVITAMIN) tablet Take 1 tablet by mouth daily.    Marland Kitchen oxyCODONE-acetaminophen (PERCOCET/ROXICET) 5-325 MG tablet Take 1 tablet by mouth every 8 (eight) hours as needed for severe pain. 30 tablet 0  . pravastatin (PRAVACHOL) 40 MG tablet TAKE 1 TABLET BY MOUTH EVERY DAY (Patient taking differently: Take 40 mg by mouth at bedtime. ) 90 tablet 3  . prochlorperazine (COMPAZINE) 10 MG tablet Take 1 tablet (10 mg total) by mouth every 6 (six) hours as needed for nausea or vomiting. 30 tablet 1  . senna-docusate (SENNA S) 8.6-50 MG tablet 1 to 2 twice daily for constipation 120 tablet 5  . Wound Dressings (SONAFINE EX) Apply 1 application topically daily.    Marland Kitchen albuterol (PROVENTIL) (2.5 MG/3ML) 0.083% nebulizer  solution Take 3 mLs (2.5 mg total) by nebulization every 6 (six) hours as needed for wheezing or shortness of breath. (Patient not taking: Reported on 05/30/2018) 75 mL 12  . ALPRAZolam (XANAX) 0.25 MG tablet Take 1 tablet (0.25 mg total) by mouth at bedtime as needed for anxiety. (Patient not taking: Reported on 07/21/2018) 30 tablet 0  . Glucosamine-Chondroitin (OSTEO BI-FLEX REGULAR STRENGTH PO) Take 1 tablet by mouth daily.    Marland Kitchen lidocaine-prilocaine (EMLA) cream Apply 1 application topically as needed. 30 g 0   No current facility-administered medications for this visit.     SURGICAL  HISTORY:  Past Surgical History:  Procedure Laterality Date  . 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 BELPHAROPTOSIS REPAIR Bilateral   . CATARACT EXTRACTION W/ INTRAOCULAR LENS  IMPLANT, BILATERAL    . COLONOSCOPY N/A 11/03/2014   Procedure: COLONOSCOPY;  Surgeon: Rogene Houston, MD;  Location: [facility];  Service: Endoscopy;  Laterality: N/A;  1225  . IR IMAGING GUIDED PORT INSERTION  07/11/2018  . KNEE CARTILAGE SURGERY Left    Left knee  . SKIN CANCER EXCISION  12/2014, 04/25/15  . VIDEO BRONCHOSCOPY Bilateral 05/09/2018   Procedure: VIDEO BRONCHOSCOPY WITH FLUORO;  Surgeon: Juanito Doom, MD;  Location: [facility];  Service: Cardiopulmonary;  Laterality: Bilateral;    REVIEW OF SYSTEMS:   Review of Systems  Constitutional: Negative for appetite change, chills, fever and unexpected weight change.  Positive for generalized fatigue and weakness. HENT:   Negative for mouth sores, nosebleeds, sore throat and trouble swallowing.   Eyes: Negative for eye problems and icterus.  Respiratory: Negative for cough, hemoptysis, shortness of breath and wheezing.   Cardiovascular: Negative for chest pain and leg swelling.  Gastrointestinal: Negative for abdominal pain, constipation, diarrhea, nausea and vomiting.  Genitourinary: Negative for bladder incontinence, difficulty urinating, dysuria, frequency and hematuria.   Musculoskeletal: Negative for back pain, gait problem, neck pain and neck stiffness.  Skin: Negative for itching and rash.  Neurological: Negative for dizziness, extremity weakness, gait problem, headaches, light-headedness and seizures.  Hematological: Negative for adenopathy. Does not bruise/bleed easily.  Psychiatric/Behavioral: Negative for confusion, depression and sleep disturbance. The patient is not nervous/anxious.     PHYSICAL EXAMINATION:  Blood pressure 114/72, pulse 73, temperature 97.9 F (36.6 C), temperature source Oral, resp. rate 18, height 6' (1.829 m),  weight 231 lb (104.8 kg), SpO2 99 %.  ECOG PERFORMANCE STATUS: 1 - Symptomatic but completely ambulatory  Physical Exam  Constitutional: Oriented to person, place, and time and well-developed, well-nourished, and in no distress. No distress.  HENT:  Head: Normocephalic and atraumatic.  Mouth/Throat: Oropharynx is clear and moist. No oropharyngeal exudate.  Eyes: Conjunctivae are normal. Right eye exhibits no discharge. Left eye exhibits no discharge. No scleral icterus.  Neck: Normal range of motion. Neck supple.  Cardiovascular: Normal rate, regular rhythm, normal heart sounds and intact distal pulses.   Pulmonary/Chest: Effort normal and breath sounds normal. No respiratory distress. No wheezes. No rales.  Abdominal: Soft. Bowel sounds are normal. Exhibits no distension and no mass. There is no tenderness.  Musculoskeletal: Normal range of motion. Exhibits no edema.  Lymphadenopathy:    No cervical adenopathy.  Neurological: Alert and oriented to person, place, and time. Exhibits normal muscle tone. Gait normal. Coordination normal.  Skin: Skin is warm and dry. No rash noted. Not diaphoretic. No erythema. No pallor.  Psychiatric: Mood, memory and judgment normal.  Vitals reviewed.  LABORATORY DATA: Lab  Results  Component Value Date   WBC 5.5 07/21/2018   HGB 11.1 (L) 07/21/2018   HCT 34.8 (L) 07/21/2018   MCV 92.3 07/21/2018   PLT 168 07/21/2018      Chemistry      Component Value Date/Time   NA 142 07/21/2018 0901   NA 144 04/09/2018 1507   K 4.6 07/21/2018 0901   CL 109 07/21/2018 0901   CO2 26 07/21/2018 0901   BUN 13 07/21/2018 0901   BUN 15 04/09/2018 1507   CREATININE 0.92 07/21/2018 0901      Component Value Date/Time   CALCIUM 9.5 07/21/2018 0901   ALKPHOS 76 07/21/2018 0901   AST 20 07/21/2018 0901   ALT 18 07/21/2018 0901   BILITOT 0.5 07/21/2018 0901       RADIOGRAPHIC STUDIES:  Ct Chest W Contrast  Result Date: 07/14/2018 CLINICAL DATA:   Non-small-cell lung cancer. EXAM: CT CHEST, ABDOMEN, AND PELVIS WITH CONTRAST TECHNIQUE: Multidetector CT imaging of the chest, abdomen and pelvis was performed following the standard protocol during bolus administration of intravenous contrast. CONTRAST:  122mL OMNIPAQUE IOHEXOL 300 MG/ML  SOLN COMPARISON:  PET-CT 05/05/2018. FINDINGS: CT CHEST FINDINGS Cardiovascular: The heart size is normal. No substantial pericardial effusion. Coronary artery calcification is evident. Atherosclerotic calcification is noted in the wall of the thoracic aorta. Right Port-A-Cath tip is positioned in the distal SVC. Mediastinum/Nodes: No mediastinal lymphadenopathy. Right infrahilar mass measured previously at 3.4 x 3.4 cm now measures 2.2 x 2.9 cm (38/2). No left hilar lymphadenopathy. No right axillary lymphadenopathy. The left axillary lesion measures 2.2 x 1.5 cm today compared to 2.9 x 2.3 cm previously. Lungs/Pleura: Left upper lobe nodule measures 1.9 cm today (79/7) compared to 2.2 cm previously. Interval progression of right infrahilar volume loss and airspace disease, likely related to radiation therapy. No pleural effusion. Musculoskeletal: No worrisome lytic or sclerotic osseous abnormality. CT ABDOMEN PELVIS FINDINGS Hepatobiliary: 1.7 cm cyst identified posterior left liver, stable. There is no evidence for gallstones, gallbladder wall thickening, or pericholecystic fluid. No intrahepatic or extrahepatic biliary dilation. Pancreas: No focal mass lesion. No dilatation of the main duct. No intraparenchymal cyst. No peripancreatic edema. Spleen: No splenomegaly. No focal mass lesion. Adrenals/Urinary Tract: No adrenal nodule or mass. Tiny bilateral nonobstructing renal stones again identified. No evidence for hydroureter. The urinary bladder appears normal for the degree of distention. Stomach/Bowel: Stomach is nondistended. No gastric wall thickening. No evidence of outlet obstruction. Duodenum is normally positioned as  is the ligament of Treitz. No small bowel wall thickening. No small bowel dilatation. The terminal ileum is normal. The appendix is normal. Diverticular changes are noted in the left colon without evidence of diverticulitis. Vascular/Lymphatic: There is abdominal aortic atherosclerosis without aneurysm. There is no gastrohepatic or hepatoduodenal ligament lymphadenopathy. No intraperitoneal or retroperitoneal lymphadenopathy. No pelvic sidewall lymphadenopathy. Reproductive: Prostate gland upper normal for size. Other: No intraperitoneal free fluid. Musculoskeletal: No worrisome lytic or sclerotic osseous abnormality. IMPRESSION: 1. Interval decrease in size of the right infrahilar lesion and left axillary lymphadenopathy. 2. Slight interval decrease in the left upper lobe pulmonary nodule. 3. No new or progressive interval findings. 4. Bilateral nonobstructing renal stones. 5.  Aortic Atherosclerois (ICD10-170.0) Electronically Signed   By: Misty Stanley M.D.   On: 07/14/2018 14:09   Ct Abdomen Pelvis W Contrast  Result Date: 07/14/2018 CLINICAL DATA:  Non-small-cell lung cancer. EXAM: CT CHEST, ABDOMEN, AND PELVIS WITH CONTRAST TECHNIQUE: Multidetector CT imaging of the chest, abdomen and pelvis was  performed following the standard protocol during bolus administration of intravenous contrast. CONTRAST:  126mL OMNIPAQUE IOHEXOL 300 MG/ML  SOLN COMPARISON:  PET-CT 05/05/2018. FINDINGS: CT CHEST FINDINGS Cardiovascular: The heart size is normal. No substantial pericardial effusion. Coronary artery calcification is evident. Atherosclerotic calcification is noted in the wall of the thoracic aorta. Right Port-A-Cath tip is positioned in the distal SVC. Mediastinum/Nodes: No mediastinal lymphadenopathy. Right infrahilar mass measured previously at 3.4 x 3.4 cm now measures 2.2 x 2.9 cm (38/2). No left hilar lymphadenopathy. No right axillary lymphadenopathy. The left axillary lesion measures 2.2 x 1.5 cm today  compared to 2.9 x 2.3 cm previously. Lungs/Pleura: Left upper lobe nodule measures 1.9 cm today (79/7) compared to 2.2 cm previously. Interval progression of right infrahilar volume loss and airspace disease, likely related to radiation therapy. No pleural effusion. Musculoskeletal: No worrisome lytic or sclerotic osseous abnormality. CT ABDOMEN PELVIS FINDINGS Hepatobiliary: 1.7 cm cyst identified posterior left liver, stable. There is no evidence for gallstones, gallbladder wall thickening, or pericholecystic fluid. No intrahepatic or extrahepatic biliary dilation. Pancreas: No focal mass lesion. No dilatation of the main duct. No intraparenchymal cyst. No peripancreatic edema. Spleen: No splenomegaly. No focal mass lesion. Adrenals/Urinary Tract: No adrenal nodule or mass. Tiny bilateral nonobstructing renal stones again identified. No evidence for hydroureter. The urinary bladder appears normal for the degree of distention. Stomach/Bowel: Stomach is nondistended. No gastric wall thickening. No evidence of outlet obstruction. Duodenum is normally positioned as is the ligament of Treitz. No small bowel wall thickening. No small bowel dilatation. The terminal ileum is normal. The appendix is normal. Diverticular changes are noted in the left colon without evidence of diverticulitis. Vascular/Lymphatic: There is abdominal aortic atherosclerosis without aneurysm. There is no gastrohepatic or hepatoduodenal ligament lymphadenopathy. No intraperitoneal or retroperitoneal lymphadenopathy. No pelvic sidewall lymphadenopathy. Reproductive: Prostate gland upper normal for size. Other: No intraperitoneal free fluid. Musculoskeletal: No worrisome lytic or sclerotic osseous abnormality. IMPRESSION: 1. Interval decrease in size of the right infrahilar lesion and left axillary lymphadenopathy. 2. Slight interval decrease in the left upper lobe pulmonary nodule. 3. No new or progressive interval findings. 4. Bilateral  nonobstructing renal stones. 5.  Aortic Atherosclerois (ICD10-170.0) Electronically Signed   By: Misty Stanley M.D.   On: 07/14/2018 14:09   Ir Imaging Guided Port Insertion  Result Date: 07/11/2018 CLINICAL DATA:  Metastatic squamous carcinoma of the lung and need for porta cath for continued chemotherapy. EXAM: IMPLANTED PORT A CATH PLACEMENT WITH ULTRASOUND AND FLUOROSCOPIC GUIDANCE ANESTHESIA/SEDATION: 3.0 mg IV Versed; 100 mcg IV Fentanyl Total Moderate Sedation Time:  33 minutes The patient's level of consciousness and physiologic status were continuously monitored during the procedure by Radiology nursing. Additional Medications: 2 g IV Ancef. FLUOROSCOPY TIME:  24 seconds.  6.8 mGy. PROCEDURE: The procedure, risks, benefits, and alternatives were explained to the patient. Questions regarding the procedure were encouraged and answered. The patient understands and consents to the procedure. A time-out was performed prior to initiating the procedure. Ultrasound was utilized to confirm patency of the right internal jugular vein. The right neck and chest were prepped with chlorhexidine in a sterile fashion, and a sterile drape was applied covering the operative field. Maximum barrier sterile technique with sterile gowns and gloves were used for the procedure. Local anesthesia was provided with 1% lidocaine. After creating a small venotomy incision, a 21 gauge needle was advanced into the right internal jugular vein under direct, real-time ultrasound guidance. Ultrasound image documentation was performed. After securing guidewire  access, an 8 Fr dilator was placed. A J-wire was kinked to measure appropriate catheter length. A subcutaneous port pocket was then created along the upper chest wall utilizing sharp and blunt dissection. Portable cautery was utilized. The pocket was irrigated with sterile saline. A single lumen power injectable port was chosen for placement. The 8 Fr catheter was tunneled from the  port pocket site to the venotomy incision. The port was placed in the pocket. External catheter was trimmed to appropriate length based on guidewire measurement. At the venotomy, an 8 Fr peel-away sheath was placed over a guidewire. The catheter was then placed through the sheath and the sheath removed. Final catheter positioning was confirmed and documented with a fluoroscopic spot image. The port was accessed with a needle and aspirated and flushed with heparinized saline. The access needle was removed. The venotomy and port pocket incisions were closed with subcutaneous 3-0 Monocryl and subcuticular 4-0 Vicryl. Dermabond was applied to both incisions. COMPLICATIONS: COMPLICATIONS None FINDINGS: After catheter placement, the tip lies at the cavo-atrial junction. The catheter aspirates normally and is ready for immediate use. IMPRESSION: Placement of single lumen port a cath via right internal jugular vein. The catheter tip lies at the cavo-atrial junction. A power injectable port a cath was placed and is ready for immediate use. Electronically Signed   By: Aletta Edouard M.D.   On: 07/11/2018 10:40     ASSESSMENT/PLAN:  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[place]) This is a very pleasant 72 year old white male with very light most smoking history recently diagnosed with stage IV (T3, N0, M1c) non-small cell lung cancer, squamous cell carcinoma based on the biopsy from the left axillary mass. The patient is currently undergoing systemic chemotherapy with carboplatin, paclitaxel and Keytruda status post 3 cycles.  Has been tolerating this treatment well with no concerning complaints except for generalized fatigue and weakness and also aching pain after the Neulasta injection.  He had a restaging CT scan of the chest, abdomen, pelvis and is here to discuss the results.  The patient was seen with Dr. Earlie Server.  CT scan results were discussed with the patient his wife which showed a decrease in the  size of the right infrahilar lesion, left axillary lymph node, and left upper lobe pulmonary nodule.  Recommend for him to continue carboplatin, Taxol, and Keytruda and then change to maintenance therapy with Keytruda only beginning with cycle #5.  He will proceed with cycle 4 of his treatment tomorrow as scheduled.  He will continue to have weekly labs until he begins maintenance Keytruda.  Follow-up visit will be in 3 weeks for evaluation prior to cycle 5 of his treatment.  For pain related to Neulasta, continue Claritin and oxycodone as needed.  The patient was advised to call immediately if he has any concerning symptoms in the interval. The patient voices understanding of current disease status and treatment options and is in agreement with the current care plan. All questions were answered. The patient knows to call the clinic with any problems, questions or concerns. We can certainly see the patient much sooner if necessary.   No orders of the defined types were placed in this encounter.    Travis Bussing, DNP, AGPCNP-BC, AOCNP 07/21/18   ADDENDUM: Hematology/Oncology Attending: I had a face-to-face encounter with the patient today.  I recommended his care plan.  This is a very pleasant 72 years old white male with metastatic non-small cell lung cancer, squamous cell carcinoma.  He is currently undergoing systemic chemotherapy with carboplatin, paclitaxel and Keytruda status post 3 cycles.  He has been tolerating this treatment well except for fatigue and aching pain after his Neulasta injection.  The patient denied having any other significant complaints. He had repeat CT scan of the chest, abdomen and pelvis performed recently.  I personally and independently reviewed the scans and discussed the results with the patient and his wife.  His a scan showed partial response with decrease in many of the lymph nodes and masses in the lung and axilla. I recommended for the patient to continue his  current treatment and he will proceed with cycle #4 of his chemotherapy with carboplatin, paclitaxel and Keytruda today. Starting from cycle #5 he will be on maintenance treatment with single agent Ketruda (pembrolizumab). I will see him back for follow-up visit in 3 weeks for evaluation before the next cycle of his treatment. The patient was advised to call immediately if he has any concerning symptoms in the interval.  Disclaimer: This Padilla was dictated with voice recognition software. Similar sounding words can inadvertently be transcribed and may be missed upon review. Travis Kempf, MD 07/21/18

## 2018-07-22 ENCOUNTER — Inpatient Hospital Stay: Payer: Medicare Other

## 2018-07-22 VITALS — BP 118/76 | HR 78 | Temp 98.4°F | Resp 18

## 2018-07-22 DIAGNOSIS — Z7689 Persons encountering health services in other specified circumstances: Secondary | ICD-10-CM | POA: Diagnosis not present

## 2018-07-22 DIAGNOSIS — C3491 Malignant neoplasm of unspecified part of right bronchus or lung: Secondary | ICD-10-CM

## 2018-07-22 DIAGNOSIS — Z5111 Encounter for antineoplastic chemotherapy: Secondary | ICD-10-CM | POA: Diagnosis not present

## 2018-07-22 DIAGNOSIS — C773 Secondary and unspecified malignant neoplasm of axilla and upper limb lymph nodes: Secondary | ICD-10-CM | POA: Diagnosis not present

## 2018-07-22 DIAGNOSIS — Z923 Personal history of irradiation: Secondary | ICD-10-CM | POA: Diagnosis not present

## 2018-07-22 DIAGNOSIS — Z5112 Encounter for antineoplastic immunotherapy: Secondary | ICD-10-CM | POA: Diagnosis not present

## 2018-07-22 MED ORDER — PALONOSETRON HCL INJECTION 0.25 MG/5ML
INTRAVENOUS | Status: AC
  Filled 2018-07-22: qty 5

## 2018-07-22 MED ORDER — SODIUM CHLORIDE 0.9 % IV SOLN
Freq: Once | INTRAVENOUS | Status: AC
  Administered 2018-07-22: 11:00:00 via INTRAVENOUS
  Filled 2018-07-22: qty 250

## 2018-07-22 MED ORDER — FAMOTIDINE IN NACL 20-0.9 MG/50ML-% IV SOLN: 20.0000 mg | Freq: Once | INTRAVENOUS | Status: DC

## 2018-07-22 MED ORDER — HEPARIN SOD (PORK) LOCK FLUSH 100 UNIT/ML IV SOLN
500.0000 [IU] | Freq: Once | INTRAVENOUS | Status: AC | PRN
  Administered 2018-07-22: 500 [IU]
  Filled 2018-07-22: qty 5

## 2018-07-22 MED ORDER — SODIUM CHLORIDE 0.9 % IV SOLN
Freq: Once | INTRAVENOUS | Status: AC
  Administered 2018-07-22: 12:00:00 via INTRAVENOUS
  Filled 2018-07-22: qty 5

## 2018-07-22 MED ORDER — SODIUM CHLORIDE 0.9 % IV SOLN
20.0000 mg | Freq: Once | INTRAVENOUS | Status: AC
  Administered 2018-07-22: 20 mg via INTRAVENOUS
  Filled 2018-07-22: qty 2

## 2018-07-22 MED ORDER — SODIUM CHLORIDE 0.9% FLUSH
10.0000 mL | INTRAVENOUS | Status: DC | PRN
  Administered 2018-07-22: 10 mL
  Filled 2018-07-22: qty 10

## 2018-07-22 MED ORDER — DIPHENHYDRAMINE HCL 50 MG/ML IJ SOLN
INTRAMUSCULAR | Status: AC
  Filled 2018-07-22: qty 1

## 2018-07-22 MED ORDER — SODIUM CHLORIDE 0.9 % IV SOLN
200.0000 mg | Freq: Once | INTRAVENOUS | Status: AC
  Administered 2018-07-22: 200 mg via INTRAVENOUS
  Filled 2018-07-22: qty 8

## 2018-07-22 MED ORDER — PALONOSETRON HCL INJECTION 0.25 MG/5ML
0.2500 mg | Freq: Once | INTRAVENOUS | Status: AC
  Administered 2018-07-22: 0.25 mg via INTRAVENOUS

## 2018-07-22 MED ORDER — SODIUM CHLORIDE 0.9 % IV SOLN
638.0000 mg | Freq: Once | INTRAVENOUS | Status: AC
  Administered 2018-07-22: 640 mg via INTRAVENOUS
  Filled 2018-07-22: qty 64

## 2018-07-22 MED ORDER — DIPHENHYDRAMINE HCL 50 MG/ML IJ SOLN
50.0000 mg | Freq: Once | INTRAMUSCULAR | Status: AC
  Administered 2018-07-22: 50 mg via INTRAVENOUS

## 2018-07-22 MED ORDER — SODIUM CHLORIDE 0.9 % IV SOLN
175.0000 mg/m2 | Freq: Once | INTRAVENOUS | Status: AC
  Administered 2018-07-22: 414 mg via INTRAVENOUS
  Filled 2018-07-22: qty 69

## 2018-07-22 NOTE — Patient Instructions (Signed)
[facility] Discharge Instructions for Patients Receiving Chemotherapy  Today you received the following chemotherapy agents Keytruda, Taxol, and Carboplatin  To help prevent nausea and vomiting after your treatment, we encourage you to take your nausea medication as directed   If you develop nausea and vomiting that is not controlled by your nausea medication, call the clinic.   BELOW ARE SYMPTOMS THAT SHOULD BE REPORTED IMMEDIATELY:  *FEVER GREATER THAN 100.5 F  *CHILLS WITH OR WITHOUT FEVER  NAUSEA AND VOMITING THAT IS NOT CONTROLLED WITH YOUR NAUSEA MEDICATION  *UNUSUAL SHORTNESS OF BREATH  *UNUSUAL BRUISING OR BLEEDING  TENDERNESS IN MOUTH AND THROAT WITH OR WITHOUT PRESENCE OF ULCERS  *URINARY PROBLEMS  *BOWEL PROBLEMS  UNUSUAL RASH Items with * indicate a potential emergency and should be followed up as soon as possible.  Feel free to call the clinic should you have any questions or concerns. The clinic phone number is [phone].  Please show the [facility] at check-in to the Emergency Department and triage nurse.

## 2018-07-24 ENCOUNTER — Telehealth: Payer: Self-pay

## 2018-07-24 ENCOUNTER — Inpatient Hospital Stay: Payer: Medicare Other

## 2018-07-24 DIAGNOSIS — Z5112 Encounter for antineoplastic immunotherapy: Secondary | ICD-10-CM | POA: Diagnosis not present

## 2018-07-24 DIAGNOSIS — C3491 Malignant neoplasm of unspecified part of right bronchus or lung: Secondary | ICD-10-CM

## 2018-07-24 DIAGNOSIS — C773 Secondary and unspecified malignant neoplasm of axilla and upper limb lymph nodes: Secondary | ICD-10-CM | POA: Diagnosis not present

## 2018-07-24 DIAGNOSIS — Z923 Personal history of irradiation: Secondary | ICD-10-CM | POA: Diagnosis not present

## 2018-07-24 DIAGNOSIS — Z7689 Persons encountering health services in other specified circumstances: Secondary | ICD-10-CM | POA: Diagnosis not present

## 2018-07-24 DIAGNOSIS — Z5111 Encounter for antineoplastic chemotherapy: Secondary | ICD-10-CM | POA: Diagnosis not present

## 2018-07-24 MED ORDER — PEGFILGRASTIM-CBQV 6 MG/0.6ML ~~LOC~~ SOSY
PREFILLED_SYRINGE | SUBCUTANEOUS | Status: AC
  Filled 2018-07-24: qty 0.6

## 2018-07-24 MED ORDER — PEGFILGRASTIM-CBQV 6 MG/0.6ML ~~LOC~~ SOSY
6.0000 mg | PREFILLED_SYRINGE | Freq: Once | SUBCUTANEOUS | Status: AC
  Administered 2018-07-24: 6 mg via SUBCUTANEOUS

## 2018-07-24 NOTE — Patient Instructions (Signed)
Pegfilgrastim injection What is this medicine? PEGFILGRASTIM (PEG fil gra stim) is a long-acting granulocyte colony-stimulating factor that stimulates the growth of neutrophils, a type of white blood cell important in the body's fight against infection. It is used to reduce the incidence of fever and infection in patients with certain types of cancer who are receiving chemotherapy that affects the bone marrow, and to increase survival after being exposed to high doses of radiation. This medicine may be used for other purposes; ask your health care provider or pharmacist if you have questions. COMMON BRAND NAME(S): Neulasta What should I tell my health care provider before I take this medicine? They need to know if you have any of these conditions: -kidney disease -latex allergy -ongoing radiation therapy -sickle cell disease -skin reactions to acrylic adhesives (On-Body Injector only) -an unusual or allergic reaction to pegfilgrastim, filgrastim, other medicines, foods, dyes, or preservatives -pregnant or trying to get pregnant -breast-feeding How should I use this medicine? This medicine is for injection under the skin. If you get this medicine at home, you will be taught how to prepare and give the pre-filled syringe or how to use the On-body Injector. Refer to the patient Instructions for Use for detailed instructions. Use exactly as directed. Tell your healthcare provider immediately if you suspect that the On-body Injector may not have performed as intended or if you suspect the use of the On-body Injector resulted in a missed or partial dose. It is important that you put your used needles and syringes in a special sharps container. Do not put them in a trash can. If you do not have a sharps container, call your pharmacist or healthcare provider to get one. Talk to your pediatrician regarding the use of this medicine in children. While this drug may be prescribed for selected conditions,  precautions do apply. Overdosage: If you think you have taken too much of this medicine contact a poison control center or emergency room at once. NOTE: This medicine is only for you. Do not share this medicine with others. What if I miss a dose? It is important not to miss your dose. Call your doctor or health care professional if you miss your dose. If you miss a dose due to an On-body Injector failure or leakage, a new dose should be administered as soon as possible using a single prefilled syringe for manual use. What may interact with this medicine? Interactions have not been studied. Give your health care provider a list of all the medicines, herbs, non-prescription drugs, or dietary supplements you use. Also tell them if you smoke, drink alcohol, or use illegal drugs. Some items may interact with your medicine. This list may not describe all possible interactions. Give your health care provider a list of all the medicines, herbs, non-prescription drugs, or dietary supplements you use. Also tell them if you smoke, drink alcohol, or use illegal drugs. Some items may interact with your medicine. What should I watch for while using this medicine? You may need blood work done while you are taking this medicine. If you are going to need a MRI, CT scan, or other procedure, tell your doctor that you are using this medicine (On-Body Injector only). What side effects may I notice from receiving this medicine? Side effects that you should report to your doctor or health care professional as soon as possible: -allergic reactions like skin rash, itching or hives, swelling of the face, lips, or tongue -dizziness -fever -pain, redness, or irritation at site   where injected -pinpoint red spots on the skin -red or dark-brown urine -shortness of breath or breathing problems -stomach or side pain, or pain at the shoulder -swelling -tiredness -trouble passing urine or change in the amount of urine Side  effects that usually do not require medical attention (report to your doctor or health care professional if they continue or are bothersome): -bone pain -muscle pain This list may not describe all possible side effects. Call your doctor for medical advice about side effects. You may report side effects to FDA at 1-800-FDA-1088. Where should I keep my medicine? Keep out of the reach of children. Store pre-filled syringes in a refrigerator between 2 and 8 degrees C (36 and 46 degrees F). Do not freeze. Keep in carton to protect from light. Throw away this medicine if it is left out of the refrigerator for more than 48 hours. Throw away any unused medicine after the expiration date. NOTE: This sheet is a summary. It may not cover all possible information. If you have questions about this medicine, talk to your doctor, pharmacist, or health care provider.  2018 Elsevier/Gold Standard (2016-08-23 12:58:03)  

## 2018-07-24 NOTE — Telephone Encounter (Signed)
Per patient request to add port to his current appointments accept for his labs only. Per 11/14 walk in

## 2018-07-25 ENCOUNTER — Other Ambulatory Visit: Payer: Self-pay

## 2018-07-25 ENCOUNTER — Emergency Department (HOSPITAL_COMMUNITY): Payer: Medicare Other

## 2018-07-25 ENCOUNTER — Encounter (HOSPITAL_COMMUNITY): Payer: Self-pay | Admitting: Emergency Medicine

## 2018-07-25 ENCOUNTER — Emergency Department (HOSPITAL_COMMUNITY)
Admission: EM | Admit: 2018-07-25 | Discharge: 2018-07-25 | Disposition: A | Discharge status: Home / Self Care | Payer: Medicare Other | Attending: Emergency Medicine | Admitting: Emergency Medicine

## 2018-07-25 ENCOUNTER — Encounter: Payer: Self-pay | Admitting: Oncology

## 2018-07-25 DIAGNOSIS — I10 Essential (primary) hypertension: Secondary | ICD-10-CM | POA: Diagnosis not present

## 2018-07-25 DIAGNOSIS — Z79899 Other long term (current) drug therapy: Secondary | ICD-10-CM | POA: Diagnosis not present

## 2018-07-25 DIAGNOSIS — Z87891 Personal history of nicotine dependence: Secondary | ICD-10-CM | POA: Diagnosis not present

## 2018-07-25 DIAGNOSIS — R918 Other nonspecific abnormal finding of lung field: Secondary | ICD-10-CM | POA: Diagnosis not present

## 2018-07-25 DIAGNOSIS — Z85828 Personal history of other malignant neoplasm of skin: Secondary | ICD-10-CM | POA: Insufficient documentation

## 2018-07-25 DIAGNOSIS — Z85118 Personal history of other malignant neoplasm of bronchus and lung: Secondary | ICD-10-CM | POA: Diagnosis not present

## 2018-07-25 DIAGNOSIS — R0789 Other chest pain: Secondary | ICD-10-CM | POA: Insufficient documentation

## 2018-07-25 DIAGNOSIS — I2510 Atherosclerotic heart disease of native coronary artery without angina pectoris: Secondary | ICD-10-CM | POA: Diagnosis not present

## 2018-07-25 LAB — CBC WITH DIFFERENTIAL/PLATELET
ABS IMMATURE GRANULOCYTES: 4.86 10*3/uL — AB (ref 0.00–0.07)
BASOS ABS: 0.1 10*3/uL (ref 0.0–0.1)
Basophils Relative: 1 %
Eosinophils Absolute: 0.1 10*3/uL (ref 0.0–0.5)
Eosinophils Relative: 1 %
HEMATOCRIT: 34.2 % — AB (ref 39.0–52.0)
HEMOGLOBIN: 11.4 g/dL — AB (ref 13.0–17.0)
Immature Granulocytes: 17 %
LYMPHS ABS: 0.7 10*3/uL (ref 0.7–4.0)
LYMPHS PCT: 3 %
MCH: 31.1 pg (ref 26.0–34.0)
MCHC: 33.3 g/dL (ref 30.0–36.0)
MCV: 93.4 fL (ref 80.0–100.0)
Monocytes Absolute: 0.4 10*3/uL (ref 0.1–1.0)
Monocytes Relative: 1 %
NEUTROS PCT: 77 %
NRBC: 0 % (ref 0.0–0.2)
Neutro Abs: 22.5 10*3/uL — ABNORMAL HIGH (ref 1.7–7.7)
Platelets: 141 10*3/uL — ABNORMAL LOW (ref 150–400)
RBC: 3.66 MIL/uL — ABNORMAL LOW (ref 4.22–5.81)
RDW: 15.8 % — ABNORMAL HIGH (ref 11.5–15.5)
WBC: 28.8 10*3/uL — ABNORMAL HIGH (ref 4.0–10.5)

## 2018-07-25 LAB — COMPREHENSIVE METABOLIC PANEL
ALK PHOS: 72 U/L (ref 38–126)
ALT: 22 U/L (ref 0–44)
AST: 27 U/L (ref 15–41)
Albumin: 3.8 g/dL (ref 3.5–5.0)
Anion gap: 9 (ref 5–15)
BILIRUBIN TOTAL: 0.9 mg/dL (ref 0.3–1.2)
BUN: 13 mg/dL (ref 8–23)
CALCIUM: 8.7 mg/dL — AB (ref 8.9–10.3)
CO2: 23 mmol/L (ref 22–32)
CREATININE: 0.78 mg/dL (ref 0.61–1.24)
Chloride: 105 mmol/L (ref 98–111)
GFR calc Af Amer: >60 mL/min (ref >60)
GFR calc non Af Amer: >60 mL/min (ref >60)
Glucose, Bld: 85 mg/dL (ref 70–99)
Potassium: 4 mmol/L (ref 3.5–5.1)
SODIUM: 137 mmol/L (ref 135–145)
Total Protein: 7.2 g/dL (ref 6.5–8.1)

## 2018-07-25 MED ORDER — HEPARIN SOD (PORK) LOCK FLUSH 100 UNIT/ML IV SOLN
500.0000 [IU] | Freq: Once | INTRAVENOUS | Status: AC
  Administered 2018-07-25: 500 [IU]
  Filled 2018-07-25: qty 5

## 2018-07-25 MED ORDER — HYDROCODONE-ACETAMINOPHEN 5-325 MG PO TABS
1.0000 | ORAL_TABLET | Freq: Once | ORAL | Status: AC
  Administered 2018-07-25: 1 via ORAL
  Filled 2018-07-25: qty 1

## 2018-07-25 MED ORDER — SODIUM CHLORIDE 0.9 % IV SOLN
INTRAVENOUS | Status: DC
  Administered 2018-07-25: 20 mL/h via INTRAVENOUS

## 2018-07-25 NOTE — ED Provider Notes (Signed)
[place] DEPT Provider Note   CSN: [id number] Arrival date & time: 07/25/18  1856     History   Chief Complaint Chief Complaint  Patient presents with  . Chest Pain    HPI Travis Padilla is a 72 y.o. male.  72 year old male with history of right-sided lung cancer presents with 1 day history of constant pinpoint sharp right anterior lateral rib pain.  He denies any dyspnea or anginal type chest pain.  No hemoptysis or cough.  No fever or chills.  No leg pain or swelling.  Patient took Vicodin yesterday which did relieve his symptoms somewhat.  Today the pain is been persistent and without rashes to the skin.  Called his doctor and told to come in for further management.     Past Medical History:  Diagnosis Date  . Abnormal nuclear stress test    December, 2013  . Anemia   . CAD (coronary artery disease)    Mild nonobstructive plaque in cath 2013  . Chest pain    December, 2013  . Dizziness   . Gout   . Hemorrhoid   . Hyperlipidemia   . Hypertension   . Skin cancer   . Spinal stenosis     Patient Active Problem List   Diagnosis Date Noted  . 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 Encounter for antineoplastic chemotherapy 05/13/2018  . Encounter for antineoplastic immunotherapy 05/13/2018  . Goals of care, counseling/discussion 05/13/2018  . 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 Axillary adenopathy 04/24/2018  . Right lower lobe lung mass 04/22/2018  . Cough with hemoptysis 04/22/2018  . Wheezing 04/02/2018  . Long-term use of high-risk medication 04/02/2018  . Dizziness 06/26/2017  . Fatigue 04/30/2016  . Insomnia 04/30/2016  . Hyperlipidemia 01/31/2016  . Ear pain 05/09/2015  . Anemia   . Abnormal nuclear stress test   . OBESITY, UNSPECIFIED 03/28/2009  . ESSENTIAL HYPERTENSION, BENIGN 03/28/2009  . PALPITATIONS 03/28/2009  . SNORING 03/28/2009    Past Surgical History:  Procedure  Laterality Date  . 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 BELPHAROPTOSIS REPAIR Bilateral   . CATARACT EXTRACTION W/ INTRAOCULAR LENS  IMPLANT, BILATERAL    . COLONOSCOPY N/A 11/03/2014   Procedure: COLONOSCOPY;  Surgeon: Rogene Houston, MD;  Location: [facility];  Service: Endoscopy;  Laterality: N/A;  1225  . IR IMAGING GUIDED PORT INSERTION  07/11/2018  . KNEE CARTILAGE SURGERY Left    Left knee  . SKIN CANCER EXCISION  12/2014, 04/25/15  . VIDEO BRONCHOSCOPY Bilateral 05/09/2018   Procedure: VIDEO BRONCHOSCOPY WITH FLUORO;  Surgeon: Juanito Doom, MD;  Location: [facility] ENDOSCOPY;  Service: Cardiopulmonary;  Laterality: Bilateral;        Home Medications    Prior to Admission medications   Medication Sig Start Date End Date Taking? Authorizing Provider  acetaminophen (TYLENOL) 500 MG tablet Take 1,000 mg by mouth every 6 (six) hours as needed.    [provider]  albuterol (PROVENTIL) (2.5 MG/3ML) 0.083% nebulizer solution Take 3 mLs (2.5 mg total) by nebulization every 6 (six) hours as needed for wheezing or shortness of breath. Patient not taking: Reported on 05/30/2018 05/07/18   Juanito Doom, MD  ALPRAZolam Duanne Moron) 0.25 MG tablet Take 1 tablet (0.25 mg total) by mouth at bedtime as needed for anxiety. Patient not taking: Reported on 07/21/2018 05/07/18   Curt Bears, MD  Artificial Tear Solution (SOOTHE XP  OP) Place 1 drop into both eyes 2 (two) times daily.    [provider]  carvedilol (COREG) 12.5 MG tablet TAKE 1 AND 1/2 TABLETS BY MOUTH TWO TIMES DAILY WITH A MEAL Patient taking differently: Take 18.75 mg by mouth 2 (two) times daily with a meal.  04/17/18   [name], MD  ciprofloxacin-hydrocortisone (CIPRO [facility]) OTIC suspension Place 3 drops into the left ear 2 (two) times daily. 07/14/18   Tanner, Lyndon Code., PA-C  clindamycin (CLINDAGEL) 1 % gel Apply topically 2 (two) times daily as needed. 06/09/18   Maryanna Shape, NP    Glucosamine-Chondroitin (OSTEO BI-FLEX REGULAR STRENGTH PO) Take 1 tablet by mouth daily.    [provider]  hydrocortisone (ANUSOL-HC) 2.5 % rectal cream Place 1 application rectally 4 (four) times daily. 07/07/18   Tanner, Lyndon Code., PA-C  lidocaine-prilocaine (EMLA) cream Apply 1 application topically as needed. 07/21/18   Maryanna Shape, NP  mirtazapine (REMERON) 15 MG tablet Take 1 tablet (15 mg total) by mouth at bedtime. 07/07/18   Harle Stanford., PA-C  Multiple Vitamin (MULTIVITAMIN) tablet Take 1 tablet by mouth daily.    [provider]  oxyCODONE-acetaminophen (PERCOCET/ROXICET) 5-325 MG tablet Take 1 tablet by mouth every 8 (eight) hours as needed for severe pain. 07/01/18   Curt Bears, MD  pravastatin (PRAVACHOL) 40 MG tablet TAKE 1 TABLET BY MOUTH EVERY DAY Patient taking differently: Take 40 mg by mouth at bedtime.  05/05/18   [name], MD  prochlorperazine (COMPAZINE) 10 MG tablet Take 1 tablet (10 mg total) by mouth every 6 (six) hours as needed for nausea or vomiting. 05/13/18   Maryanna Shape, NP  senna-docusate (SENNA S) 8.6-50 MG tablet 1 to 2 twice daily for constipation 07/07/18   Sandi Mealy E., PA-C  Wound Dressings (SONAFINE EX) Apply 1 application topically daily.    [provider]    Family History Family History  Problem Relation Age of Onset  . Heart attack Father   . Heart failure Mother   . Parkinson's disease Mother   . Healthy Sister   . Skin cancer Sister   . Neuropathy Brother   . COPD Brother   . Epilepsy Brother   . Healthy Sister   . Healthy Sister   . Colon cancer Neg Hx     Social History Social History   Tobacco Use  . Smoking status: Former Smoker    Packs/day: 0.20    Years: 2.00    Pack years: 0.40    Types: Cigarettes    Start date: 10/01/1968  . Smokeless tobacco: Never Used  Substance Use Topics  . Alcohol use: No    Comment: hx of   . Drug use: No     Allergies   Trazodone and  nefazodone   Review of Systems Review of Systems  All other systems reviewed and are negative.    Physical Exam Updated Vital Signs BP (!) 158/84 (BP Location: Right Arm)   Pulse 82   Temp 98.7 F (37.1 C) (Oral)   Resp 15   Ht 1.829 m (6')   Wt 99.8 kg   SpO2 100%   BMI 29.84 kg/m   Physical Exam  Constitutional: He is oriented to person, place, and time. He appears well-developed and well-nourished.  Non-toxic appearance. No distress.  HENT:  Head: Normocephalic and atraumatic.  Eyes: Pupils are equal, round, and reactive to light. Conjunctivae, EOM and lids are normal.  Neck:  Normal range of motion. Neck supple. No tracheal deviation present. No thyroid mass present.  Cardiovascular: Normal rate, regular rhythm and normal heart sounds. Exam reveals no gallop.  No murmur heard. Pulmonary/Chest: Effort normal and breath sounds normal. No stridor. No respiratory distress. He has no decreased breath sounds. He has no wheezes. He has no rhonchi. He has no rales. He exhibits tenderness.    Abdominal: Soft. Normal appearance and bowel sounds are normal. He exhibits no distension. There is no tenderness. There is no rebound and no CVA tenderness.  Musculoskeletal: Normal range of motion. He exhibits no edema or tenderness.  Neurological: He is alert and oriented to person, place, and time. He has normal strength. No cranial nerve deficit or sensory deficit. GCS eye subscore is 4. GCS verbal subscore is 5. GCS motor subscore is 6.  Skin: Skin is warm and dry. No abrasion and no rash noted.  Psychiatric: He has a normal mood and affect. His speech is normal and behavior is normal.  Nursing note and vitals reviewed.    ED Treatments / Results  Labs (all labs ordered are listed, but only abnormal results are displayed) Labs Reviewed  CBC WITH DIFFERENTIAL/PLATELET  COMPREHENSIVE METABOLIC PANEL    EKG EKG Interpretation  Date/Time:  Friday July 25 2018 19:11:49  EST Ventricular Rate:  81 PR Interval:    QRS Duration: 102 QT Interval:  385 QTC Calculation: 447 R Axis:   -33 Text Interpretation:  Sinus rhythm Left axis deviation Confirmed by Lacretia Leigh ([id number]) on 07/25/2018 7:40:02 PM   Radiology No results found.  Procedures Procedures (including critical care time)  Medications Ordered in ED Medications  HYDROcodone-acetaminophen (NORCO/VICODIN) 5-325 MG per tablet 1 tablet (has no administration in time range)  0.9 %  sodium chloride infusion (has no administration in time range)     Initial Impression / Assessment and Plan / ED Course  I have reviewed the triage vital signs and the nursing notes.  Pertinent labs & imaging results that were available during my care of the patient were reviewed by me and considered in my medical decision making (see chart for details).     Patient's chest x-ray without acute findings.  Given hydrocodone and feels better.  Patient received a shot of Neupogen yesterday which would explain his elevated white count.  Patient stable for discharge  Final Clinical Impressions(s) / ED Diagnoses   Final diagnoses:  None    ED Discharge Orders    None       Lacretia Leigh, MD 07/25/18 2215

## 2018-07-25 NOTE — ED Triage Notes (Signed)
Patient is having right upper quadrant chest pain

## 2018-07-28 ENCOUNTER — Other Ambulatory Visit: Payer: Self-pay | Admitting: Oncology

## 2018-07-28 ENCOUNTER — Telehealth: Payer: Self-pay | Admitting: *Deleted

## 2018-07-28 ENCOUNTER — Inpatient Hospital Stay: Payer: Medicare Other

## 2018-07-28 DIAGNOSIS — Z5112 Encounter for antineoplastic immunotherapy: Secondary | ICD-10-CM | POA: Diagnosis not present

## 2018-07-28 DIAGNOSIS — Z5111 Encounter for antineoplastic chemotherapy: Secondary | ICD-10-CM | POA: Diagnosis not present

## 2018-07-28 DIAGNOSIS — C773 Secondary and unspecified malignant neoplasm of axilla and upper limb lymph nodes: Secondary | ICD-10-CM | POA: Diagnosis not present

## 2018-07-28 DIAGNOSIS — Z923 Personal history of irradiation: Secondary | ICD-10-CM | POA: Diagnosis not present

## 2018-07-28 DIAGNOSIS — C3491 Malignant neoplasm of unspecified part of right bronchus or lung: Secondary | ICD-10-CM | POA: Diagnosis not present

## 2018-07-28 DIAGNOSIS — Z7689 Persons encountering health services in other specified circumstances: Secondary | ICD-10-CM | POA: Diagnosis not present

## 2018-07-28 LAB — CMP (CANCER CENTER ONLY)
ALBUMIN: 3.7 g/dL (ref 3.5–5.0)
ALT: 53 U/L — ABNORMAL HIGH (ref 0–44)
AST: 42 U/L — AB (ref 15–41)
Alkaline Phosphatase: 111 U/L (ref 38–126)
Anion gap: 5 (ref 5–15)
BUN: 13 mg/dL (ref 8–23)
CHLORIDE: 104 mmol/L (ref 98–111)
CO2: 29 mmol/L (ref 22–32)
Calcium: 9.4 mg/dL (ref 8.9–10.3)
Creatinine: 0.97 mg/dL (ref 0.61–1.24)
GFR, Est AFR Am: >60 mL/min (ref >60)
GFR, Estimated: >60 mL/min (ref >60)
GLUCOSE: 95 mg/dL (ref 70–99)
POTASSIUM: 4.5 mmol/L (ref 3.5–5.1)
SODIUM: 138 mmol/L (ref 135–145)
Total Bilirubin: 0.5 mg/dL (ref 0.3–1.2)
Total Protein: 7.4 g/dL (ref 6.5–8.1)

## 2018-07-28 LAB — CBC WITH DIFFERENTIAL (CANCER CENTER ONLY)
ABS IMMATURE GRANULOCYTES: 0.08 10*3/uL — AB (ref 0.00–0.07)
Basophils Absolute: 0.1 10*3/uL (ref 0.0–0.1)
Basophils Relative: 1 %
Eosinophils Absolute: 0.2 10*3/uL (ref 0.0–0.5)
Eosinophils Relative: 2 %
HEMATOCRIT: 34.6 % — AB (ref 39.0–52.0)
Hemoglobin: 11.4 g/dL — ABNORMAL LOW (ref 13.0–17.0)
IMMATURE GRANULOCYTES: 1 %
LYMPHS PCT: 6 %
Lymphs Abs: 0.6 10*3/uL — ABNORMAL LOW (ref 0.7–4.0)
MCH: 30.6 pg (ref 26.0–34.0)
MCHC: 32.9 g/dL (ref 30.0–36.0)
MCV: 92.8 fL (ref 80.0–100.0)
MONO ABS: 0.8 10*3/uL (ref 0.1–1.0)
MONOS PCT: 7 %
NEUTROS ABS: 9.3 10*3/uL — AB (ref 1.7–7.7)
NEUTROS PCT: 83 %
PLATELETS: 123 10*3/uL — AB (ref 150–400)
RBC: 3.73 MIL/uL — AB (ref 4.22–5.81)
RDW: 15.8 % — AB (ref 11.5–15.5)
WBC Count: 11 10*3/uL — ABNORMAL HIGH (ref 4.0–10.5)
nRBC: 0 % (ref 0.0–0.2)

## 2018-07-28 MED ORDER — LEVOTHYROXINE SODIUM 50 MCG PO TABS: 50.0000 ug | ORAL_TABLET | Freq: Every day | ORAL | 1 refills | Status: DC

## 2018-07-28 NOTE — Telephone Encounter (Signed)
Patient walked into clinic without appointment to discuss concerns for elevated TSH from last weeks labs and to discuss his recent weekend hospital visit.  Explained per Myrtha Mantis, NP that [facility] can cause changes in TSH.  Mikey Bussing, NP to order medication to help regulate.    Patient also questioned his right sided pain that he went to the ED with.  Per recent scans they showed improvement and this was relayed to him.  Did also let the patient know that recent Neulasta could contribute to bone pain.  Per Erasmo Downer ok to also let the patient know that some kidney stones were also noted and that couldn't be discounted as contributing to his discomfort.    Patient understood and has no further concerns.

## 2018-08-04 ENCOUNTER — Inpatient Hospital Stay: Payer: Medicare Other

## 2018-08-04 DIAGNOSIS — C773 Secondary and unspecified malignant neoplasm of axilla and upper limb lymph nodes: Secondary | ICD-10-CM | POA: Diagnosis not present

## 2018-08-04 DIAGNOSIS — Z923 Personal history of irradiation: Secondary | ICD-10-CM | POA: Diagnosis not present

## 2018-08-04 DIAGNOSIS — Z5111 Encounter for antineoplastic chemotherapy: Secondary | ICD-10-CM | POA: Diagnosis not present

## 2018-08-04 DIAGNOSIS — Z7689 Persons encountering health services in other specified circumstances: Secondary | ICD-10-CM | POA: Diagnosis not present

## 2018-08-04 DIAGNOSIS — Z5112 Encounter for antineoplastic immunotherapy: Secondary | ICD-10-CM | POA: Diagnosis not present

## 2018-08-04 DIAGNOSIS — C3491 Malignant neoplasm of unspecified part of right bronchus or lung: Secondary | ICD-10-CM | POA: Diagnosis not present

## 2018-08-04 LAB — CMP (CANCER CENTER ONLY)
ALBUMIN: 3.7 g/dL (ref 3.5–5.0)
ALK PHOS: 100 U/L (ref 38–126)
ALT: 22 U/L (ref 0–44)
AST: 19 U/L (ref 15–41)
Anion gap: 6 (ref 5–15)
BILIRUBIN TOTAL: 0.4 mg/dL (ref 0.3–1.2)
BUN: 13 mg/dL (ref 8–23)
CALCIUM: 9.5 mg/dL (ref 8.9–10.3)
CO2: 26 mmol/L (ref 22–32)
CREATININE: 1.11 mg/dL (ref 0.61–1.24)
Chloride: 107 mmol/L (ref 98–111)
GFR, Est AFR Am: >60 mL/min (ref >60)
GLUCOSE: 95 mg/dL (ref 70–99)
POTASSIUM: 4.5 mmol/L (ref 3.5–5.1)
Sodium: 139 mmol/L (ref 135–145)
TOTAL PROTEIN: 7.5 g/dL (ref 6.5–8.1)

## 2018-08-04 LAB — CBC WITH DIFFERENTIAL (CANCER CENTER ONLY)
ABS IMMATURE GRANULOCYTES: 0.18 10*3/uL — AB (ref 0.00–0.07)
BASOS ABS: 0 10*3/uL (ref 0.0–0.1)
BASOS PCT: 0 %
EOS ABS: 0.3 10*3/uL (ref 0.0–0.5)
EOS PCT: 2 %
HEMATOCRIT: 36.3 % — AB (ref 39.0–52.0)
HEMOGLOBIN: 11.7 g/dL — AB (ref 13.0–17.0)
IMMATURE GRANULOCYTES: 2 %
Lymphocytes Relative: 7 %
Lymphs Abs: 0.8 10*3/uL (ref 0.7–4.0)
MCH: 30.5 pg (ref 26.0–34.0)
MCHC: 32.2 g/dL (ref 30.0–36.0)
MCV: 94.5 fL (ref 80.0–100.0)
Monocytes Absolute: 0.7 10*3/uL (ref 0.1–1.0)
Monocytes Relative: 7 %
NEUTROS PCT: 82 %
NRBC: 0 % (ref 0.0–0.2)
Neutro Abs: 8.7 10*3/uL — ABNORMAL HIGH (ref 1.7–7.7)
Platelet Count: 151 10*3/uL (ref 150–400)
RBC: 3.84 MIL/uL — ABNORMAL LOW (ref 4.22–5.81)
RDW: 16.7 % — ABNORMAL HIGH (ref 11.5–15.5)
WBC: 10.6 10*3/uL — AB (ref 4.0–10.5)

## 2018-08-11 ENCOUNTER — Inpatient Hospital Stay: Payer: Medicare Other

## 2018-08-11 ENCOUNTER — Encounter: Payer: Self-pay | Admitting: Oncology

## 2018-08-11 ENCOUNTER — Inpatient Hospital Stay: Payer: Medicare Other | Attending: Oncology | Admitting: Oncology

## 2018-08-11 ENCOUNTER — Ambulatory Visit: Payer: Medicare Other

## 2018-08-11 ENCOUNTER — Telehealth: Payer: Self-pay | Admitting: Oncology

## 2018-08-11 ENCOUNTER — Ambulatory Visit: Payer: Medicare Other | Admitting: Internal Medicine

## 2018-08-11 VITALS — BP 132/75 | HR 77 | Temp 98.1°F | Resp 18 | Ht 72.0 in | Wt 234.8 lb

## 2018-08-11 DIAGNOSIS — Z87891 Personal history of nicotine dependence: Secondary | ICD-10-CM | POA: Insufficient documentation

## 2018-08-11 DIAGNOSIS — Z5112 Encounter for antineoplastic immunotherapy: Secondary | ICD-10-CM

## 2018-08-11 DIAGNOSIS — C3491 Malignant neoplasm of unspecified part of right bronchus or lung: Secondary | ICD-10-CM

## 2018-08-11 DIAGNOSIS — G620 Drug-induced polyneuropathy: Secondary | ICD-10-CM | POA: Diagnosis not present

## 2018-08-11 DIAGNOSIS — R5383 Other fatigue: Secondary | ICD-10-CM

## 2018-08-11 DIAGNOSIS — C7802 Secondary malignant neoplasm of left lung: Secondary | ICD-10-CM | POA: Insufficient documentation

## 2018-08-11 DIAGNOSIS — R0789 Other chest pain: Secondary | ICD-10-CM | POA: Diagnosis not present

## 2018-08-11 DIAGNOSIS — E785 Hyperlipidemia, unspecified: Secondary | ICD-10-CM | POA: Insufficient documentation

## 2018-08-11 DIAGNOSIS — H539 Unspecified visual disturbance: Secondary | ICD-10-CM | POA: Diagnosis not present

## 2018-08-11 DIAGNOSIS — Z85828 Personal history of other malignant neoplasm of skin: Secondary | ICD-10-CM | POA: Diagnosis not present

## 2018-08-11 DIAGNOSIS — Z95828 Presence of other vascular implants and grafts: Secondary | ICD-10-CM

## 2018-08-11 DIAGNOSIS — I2510 Atherosclerotic heart disease of native coronary artery without angina pectoris: Secondary | ICD-10-CM | POA: Insufficient documentation

## 2018-08-11 DIAGNOSIS — I10 Essential (primary) hypertension: Secondary | ICD-10-CM | POA: Insufficient documentation

## 2018-08-11 DIAGNOSIS — E039 Hypothyroidism, unspecified: Secondary | ICD-10-CM

## 2018-08-11 DIAGNOSIS — R531 Weakness: Secondary | ICD-10-CM | POA: Insufficient documentation

## 2018-08-11 DIAGNOSIS — T451X5S Adverse effect of antineoplastic and immunosuppressive drugs, sequela: Secondary | ICD-10-CM | POA: Insufficient documentation

## 2018-08-11 DIAGNOSIS — Z79899 Other long term (current) drug therapy: Secondary | ICD-10-CM | POA: Diagnosis not present

## 2018-08-11 DIAGNOSIS — Z923 Personal history of irradiation: Secondary | ICD-10-CM | POA: Diagnosis not present

## 2018-08-11 DIAGNOSIS — C773 Secondary and unspecified malignant neoplasm of axilla and upper limb lymph nodes: Secondary | ICD-10-CM

## 2018-08-11 DIAGNOSIS — F419 Anxiety disorder, unspecified: Secondary | ICD-10-CM | POA: Insufficient documentation

## 2018-08-11 DIAGNOSIS — E032 Hypothyroidism due to medicaments and other exogenous substances: Secondary | ICD-10-CM

## 2018-08-11 DIAGNOSIS — Z5111 Encounter for antineoplastic chemotherapy: Secondary | ICD-10-CM

## 2018-08-11 HISTORY — DX: Hypothyroidism, unspecified: E03.9

## 2018-08-11 LAB — CBC WITH DIFFERENTIAL (CANCER CENTER ONLY)
Abs Immature Granulocytes: 0.04 10*3/uL (ref 0.00–0.07)
BASOS PCT: 1 %
Basophils Absolute: 0.1 10*3/uL (ref 0.0–0.1)
EOS ABS: 0.3 10*3/uL (ref 0.0–0.5)
EOS PCT: 4 %
HEMATOCRIT: 34.1 % — AB (ref 39.0–52.0)
Hemoglobin: 11.1 g/dL — ABNORMAL LOW (ref 13.0–17.0)
Immature Granulocytes: 1 %
LYMPHS ABS: 0.8 10*3/uL (ref 0.7–4.0)
Lymphocytes Relative: 13 %
MCH: 30.9 pg (ref 26.0–34.0)
MCHC: 32.6 g/dL (ref 30.0–36.0)
MCV: 95 fL (ref 80.0–100.0)
MONOS PCT: 16 %
Monocytes Absolute: 1 10*3/uL (ref 0.1–1.0)
NRBC: 0 % (ref 0.0–0.2)
Neutro Abs: 4.1 10*3/uL (ref 1.7–7.7)
Neutrophils Relative %: 65 %
PLATELETS: 153 10*3/uL (ref 150–400)
RBC: 3.59 MIL/uL — ABNORMAL LOW (ref 4.22–5.81)
RDW: 17.2 % — ABNORMAL HIGH (ref 11.5–15.5)
WBC Count: 6.2 10*3/uL (ref 4.0–10.5)

## 2018-08-11 LAB — CMP (CANCER CENTER ONLY)
ALK PHOS: 78 U/L (ref 38–126)
ALT: 14 U/L (ref 0–44)
ANION GAP: 8 (ref 5–15)
AST: 17 U/L (ref 15–41)
Albumin: 3.8 g/dL (ref 3.5–5.0)
BILIRUBIN TOTAL: 0.4 mg/dL (ref 0.3–1.2)
BUN: 13 mg/dL (ref 8–23)
CALCIUM: 9.1 mg/dL (ref 8.9–10.3)
CO2: 25 mmol/L (ref 22–32)
CREATININE: 1.09 mg/dL (ref 0.61–1.24)
Chloride: 109 mmol/L (ref 98–111)
GFR, Estimated: >60 mL/min (ref >60)
GLUCOSE: 99 mg/dL (ref 70–99)
Potassium: 4.3 mmol/L (ref 3.5–5.1)
Sodium: 142 mmol/L (ref 135–145)
TOTAL PROTEIN: 7.4 g/dL (ref 6.5–8.1)

## 2018-08-11 LAB — TSH: TSH: 56.224 u[IU]/mL — ABNORMAL HIGH (ref 0.320–4.118)

## 2018-08-11 MED ORDER — HEPARIN SOD (PORK) LOCK FLUSH 100 UNIT/ML IV SOLN
500.0000 [IU] | Freq: Once | INTRAVENOUS | Status: AC | PRN
  Administered 2018-08-11: 500 [IU]
  Filled 2018-08-11: qty 5

## 2018-08-11 MED ORDER — SODIUM CHLORIDE 0.9 % IV SOLN
Freq: Once | INTRAVENOUS | Status: AC
  Administered 2018-08-11: 13:00:00 via INTRAVENOUS
  Filled 2018-08-11: qty 250

## 2018-08-11 MED ORDER — SODIUM CHLORIDE 0.9% FLUSH
10.0000 mL | INTRAVENOUS | Status: DC | PRN
  Administered 2018-08-11: 10 mL
  Filled 2018-08-11: qty 10

## 2018-08-11 MED ORDER — SODIUM CHLORIDE 0.9 % IV SOLN
200.0000 mg | Freq: Once | INTRAVENOUS | Status: AC
  Administered 2018-08-11: 200 mg via INTRAVENOUS
  Filled 2018-08-11: qty 8

## 2018-08-11 MED ORDER — ALPRAZOLAM 0.25 MG PO TABS: 0.2500 mg | ORAL_TABLET | Freq: Every evening | ORAL | 0 refills | Status: DC | PRN

## 2018-08-11 MED ORDER — OXYCODONE-ACETAMINOPHEN 5-325 MG PO TABS: 1.0000 | ORAL_TABLET | Freq: Three times a day (TID) | ORAL | 0 refills | Status: DC | PRN

## 2018-08-11 NOTE — Telephone Encounter (Signed)
appts already scheduled per 12/2 los - end of treatment plan,

## 2018-08-11 NOTE — Assessment & Plan Note (Signed)
This is a very pleasant 72 year old white male with very light most smoking history recently diagnosed with stage IV (T3, N0, M1c) non-small cell lung cancer, squamous cell carcinoma based on the biopsy from the left axillary mass. The patient is currently undergoing systemic chemotherapy with carboplatin, paclitaxel and Keytruda status post 4 cycles.  He tolerated this treatment well with no concerning complaints except for generalized fatigue and weakness and also aching pain after the Neulasta injection.  He is now here to begin cycle 1 of maintenance Keytruda. Adverse effects of this treatment were reviewed including but not limited to immune mediated the skin rash, diarrhea, inflammation of the lung, kidney, liver, thyroid or other endocrine dysfunction.  The patient is agreeable to proceeding.  He will follow-up in 3 weeks for evaluation prior to cycle #2 of maintenance Keytruda.  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  He was advised to take his levothyroxine as prescribed.  We will monitor his TSH periodically while on treatment with Keytruda and adjust levothyroxine as needed.  For chest discomfort, he may continue oxycodone.  He was encouraged to transition to using Tylenol or ibuprofen.  For anxiety, he was given a refill of Xanax today.  The patient was advised to call immediately if he has any concerning symptoms in the interval. The patient voices understanding of current disease status and treatment options and is in agreement with the current care plan. All questions were answered. The patient knows to call the clinic with any problems, questions or concerns. We can certainly see the patient much sooner if necessary.

## 2018-08-11 NOTE — Patient Instructions (Signed)
[facility] Discharge Instructions for Patients Receiving Chemotherapy  Today you received the following chemotherapy agents: Pembrolizumab Beryle Flock)  To help prevent nausea and vomiting after your treatment, we encourage you to take your nausea medication as directed.    If you develop nausea and vomiting that is not controlled by your nausea medication, call the clinic.   BELOW ARE SYMPTOMS THAT SHOULD BE REPORTED IMMEDIATELY:  *FEVER GREATER THAN 100.5 F  *CHILLS WITH OR WITHOUT FEVER  NAUSEA AND VOMITING THAT IS NOT CONTROLLED WITH YOUR NAUSEA MEDICATION  *UNUSUAL SHORTNESS OF BREATH  *UNUSUAL BRUISING OR BLEEDING  TENDERNESS IN MOUTH AND THROAT WITH OR WITHOUT PRESENCE OF ULCERS  *URINARY PROBLEMS  *BOWEL PROBLEMS  UNUSUAL RASH Items with * indicate a potential emergency and should be followed up as soon as possible.  Feel free to call the clinic should you have any questions or concerns. The clinic phone number is [phone].  Please show the [facility] at check-in to the Emergency Department and triage nurse.

## 2018-08-11 NOTE — Progress Notes (Signed)
[facility] OFFICE PROGRESS NOTE  [name], MD [place] [address]  DIAGNOSIS:Stage IV (T3, N0, [employer]) non-small cell lung cancer, squamous cell carcinoma presented with large right infrahilar mass in addition to left upper lobe lung nodule as well as left axillary mass with left axillary lymph node diagnosed in August 2019.  PRIOR THERAPY:  1) Palliative radiotherapy to the right infrahilar mass as well as the axillary mass under the care of Dr. Lisbeth Renshaw. 2) Systemic chemotherapy with carboplatin for AUC of 5, paclitaxel 175 mg/M2 and Keytruda 200 mg IV every 3 weeks status post 4 cycles.    CURRENT THERAPY:Maintenance treatment with Keytruda 200 mg IV every 3 weeks.  First dose 08/11/2018.  INTERVAL HISTORY: VERDIS KOVAL 72 y.o. male returns for a routine follow-up visit accompanied by his wife.  The patient is feeling fine today and has no specific complaints except for ongoing fatigue.  He is trying to be more active and trying to work out more.  He states that he had increased pain in his ribs following his last cycle of chemotherapy.  He was seen in the emergency room for evaluation and his work-up was unrevealing.  Denies fevers and chills.  Chest pain has resolved.  Denies shortness of breath, cough, hemoptysis.  Denies nausea, vomiting, constipation, diarrhea.  Denies recent weight loss or night sweats.  The patient was started on levothyroxine following his last cycle of chemotherapy but states that he stopped it for a few days and then cut the dose in half because he felt the dose of levothyroxine was too high.  The patient is here for evaluation prior to cycle #1 of maintenance Keytruda.  MEDICAL HISTORY: Past Medical History:  Diagnosis Date  . Abnormal nuclear stress test    December, 2013  . Anemia   . CAD (coronary artery disease)    Mild nonobstructive plaque in cath 2013  . Chest pain    December, 2013  . Dizziness   . Gout   .  Hemorrhoid   . Hyperlipidemia   . Hypertension   . Skin cancer   . Spinal stenosis     ALLERGIES:  is allergic to trazodone and nefazodone.  MEDICATIONS:  Current Outpatient Medications  Medication Sig Dispense Refill  . ALPRAZolam (XANAX) 0.25 MG tablet Take 1 tablet (0.25 mg total) by mouth at bedtime as needed for anxiety. 30 tablet 0  . Artificial Tear Solution (SOOTHE XP OP) Place 1 drop into both eyes 2 (two) times daily.    . carvedilol (COREG) 12.5 MG tablet TAKE 1 AND 1/2 TABLETS BY MOUTH TWO TIMES DAILY WITH A MEAL (Patient taking differently: Take 18.75 mg by mouth 2 (two) times daily with a meal. ) 270 tablet 3  . Glucosamine-Chondroitin (OSTEO BI-FLEX REGULAR STRENGTH PO) Take 1 tablet by mouth daily.    . hydrocortisone (ANUSOL-HC) 2.5 % rectal cream Place 1 application rectally 4 (four) times daily. (Patient taking differently: Place 1 application rectally 4 (four) times daily as needed for hemorrhoids. ) 30 g 3  . levothyroxine (SYNTHROID, LEVOTHROID) 50 MCG tablet Take 1 tablet (50 mcg total) by mouth daily before breakfast. 30 tablet 1  . lidocaine-prilocaine (EMLA) cream Apply 1 application topically as needed. (Patient taking differently: Apply 1 application topically daily as needed (port). ) 30 g 0  . mirtazapine (REMERON) 15 MG tablet Take 1 tablet (15 mg total) by mouth at bedtime. (Patient taking differently: Take 15 mg by  mouth at bedtime as needed (sleep). ) 30 tablet 5  . Multiple Vitamin (MULTIVITAMIN) tablet Take 1 tablet by mouth daily.    Marland Kitchen oxyCODONE-acetaminophen (PERCOCET/ROXICET) 5-325 MG tablet Take 1 tablet by mouth every 8 (eight) hours as needed for severe pain. 30 tablet 0  . pravastatin (PRAVACHOL) 40 MG tablet TAKE 1 TABLET BY MOUTH EVERY DAY (Patient taking differently: Take 40 mg by mouth at bedtime. ) 90 tablet 3  . prochlorperazine (COMPAZINE) 10 MG tablet Take 1 tablet (10 mg total) by mouth every 6 (six) hours as needed for nausea or vomiting. 30  tablet 1  . senna-docusate (SENNA S) 8.6-50 MG tablet 1 to 2 twice daily for constipation (Patient taking differently: Take 1 tablet by mouth 2 (two) times daily. ) 120 tablet 5  . Wound Dressings (SONAFINE EX) Apply 1 application topically daily.    Marland Kitchen albuterol (PROVENTIL) (2.5 MG/3ML) 0.083% nebulizer solution Take 3 mLs (2.5 mg total) by nebulization every 6 (six) hours as needed for wheezing or shortness of breath. (Patient not taking: Reported on 08/11/2018) 75 mL 12  . clindamycin (CLINDAGEL) 1 % gel Apply topically 2 (two) times daily as needed. (Patient not taking: Reported on 07/25/2018) 30 g 0  . diphenhydramine-acetaminophen (TYLENOL PM) 25-500 MG TABS tablet Take 1-2 tablets by mouth at bedtime as needed (pain).     No current facility-administered medications for this visit.    Facility-Administered Medications Ordered in Other Visits  Medication Dose Route Frequency Provider Last Rate Last Dose  . heparin lock flush 100 unit/mL  500 Units Intracatheter Once PRN Curt Bears, MD      . pembrolizumab [facility]) 200 mg in sodium chloride 0.9 % 50 mL chemo infusion  200 mg Intravenous Once Curt Bears, MD 116 mL/hr at 08/11/18 1351 200 mg at 08/11/18 1351  . sodium chloride flush (NS) 0.9 % injection 10 mL  10 mL Intracatheter PRN Curt Bears, MD        SURGICAL HISTORY:  Past Surgical History:  Procedure Laterality Date  . 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 BELPHAROPTOSIS REPAIR Bilateral   . CATARACT EXTRACTION W/ INTRAOCULAR LENS  IMPLANT, BILATERAL    . COLONOSCOPY N/A 11/03/2014   Procedure: COLONOSCOPY;  Surgeon: Rogene Houston, MD;  Location: [facility];  Service: Endoscopy;  Laterality: N/A;  1225  . IR IMAGING GUIDED PORT INSERTION  07/11/2018  . KNEE CARTILAGE SURGERY Left    Left knee  . SKIN CANCER EXCISION  12/2014, 04/25/15  . VIDEO BRONCHOSCOPY Bilateral 05/09/2018   Procedure: VIDEO BRONCHOSCOPY WITH FLUORO;  Surgeon: Juanito Doom, MD;   Location: [facility] END[facility];  Service: Cardiopulmonary;  Laterality: Bilateral;    REVIEW OF SYSTEMS:   Review of Systems  Constitutional: Negative for appetite change, chills, fever and unexpected weight change.  Positive for fatigue. HENT:   Negative for mouth sores, nosebleeds, sore throat and trouble swallowing.   Eyes: Negative for eye problems and icterus.  Respiratory: Negative for cough, hemoptysis, shortness of breath and wheezing.   Cardiovascular: Negative for chest pain and leg swelling.  Gastrointestinal: Negative for abdominal pain, constipation, diarrhea, nausea and vomiting.  Genitourinary: Negative for bladder incontinence, difficulty urinating, dysuria, frequency and hematuria.   Musculoskeletal: Negative for back pain, gait problem, neck pain and neck stiffness.  Skin: Negative for itching and rash.  Neurological: Negative for dizziness, extremity weakness, gait problem, headaches, light-headedness and seizures.  Hematological: Negative for adenopathy. Does not bruise/bleed easily.  Psychiatric/Behavioral: Negative for confusion, depression and sleep disturbance. The patient is not nervous/anxious.     PHYSICAL EXAMINATION:  Blood pressure 132/75, pulse 77, temperature 98.1 F (36.7 C), temperature source Oral, resp. rate 18, height 6' (1.829 m), weight 234 lb 12.8 oz (106.5 kg), SpO2 100 %.  ECOG PERFORMANCE STATUS: 1 - Symptomatic but completely ambulatory  Physical Exam  Constitutional: Oriented to person, place, and time and well-developed, well-nourished, and in no distress. No distress.  HENT:  Head: Normocephalic and atraumatic.  Mouth/Throat: Oropharynx is clear and moist. No oropharyngeal exudate.  Eyes: Conjunctivae are normal. Right eye exhibits no discharge. Left eye exhibits no discharge. No scleral icterus.  Neck: Normal range of motion. Neck supple.  Cardiovascular: Normal rate, regular rhythm, normal heart sounds and intact distal pulses.    Pulmonary/Chest: Effort normal and breath sounds normal. No respiratory distress. No wheezes. No rales.  Abdominal: Soft. Bowel sounds are normal. Exhibits no distension and no mass. There is no tenderness.  Musculoskeletal: Normal range of motion. Exhibits no edema.  Lymphadenopathy:    No cervical adenopathy.  Neurological: Alert and oriented to person, place, and time. Exhibits normal muscle tone. Gait normal. Coordination normal.  Skin: Skin is warm and dry. No rash noted. Not diaphoretic. No erythema. No pallor.  Psychiatric: Mood, memory and judgment normal.  Vitals reviewed.  LABORATORY DATA: Lab Results  Component Value Date   WBC 6.2 08/11/2018   HGB 11.1 (L) 08/11/2018   HCT 34.1 (L) 08/11/2018   MCV 95.0 08/11/2018   PLT 153 08/11/2018      Chemistry      Component Value Date/Time   NA 142 08/11/2018 1121   NA 144 04/09/2018 1507   K 4.3 08/11/2018 1121   CL 109 08/11/2018 1121   CO2 25 08/11/2018 1121   BUN 13 08/11/2018 1121   BUN 15 04/09/2018 1507   CREATININE 1.09 08/11/2018 1121      Component Value Date/Time   CALCIUM 9.1 08/11/2018 1121   ALKPHOS 78 08/11/2018 1121   AST 17 08/11/2018 1121   ALT 14 08/11/2018 1121   BILITOT 0.4 08/11/2018 1121       RADIOGRAPHIC STUDIES:  Dg Ribs Unilateral W/chest Right  Result Date: 07/25/2018 CLINICAL DATA:  72 y/o M; right upper quadrant chest pain at site of lung cancer radiation therapy. EXAM: RIGHT RIBS AND CHEST - 3+ VIEW COMPARISON:  07/14/2018 CT chest. 05/09/2018 chest radiograph. FINDINGS: Right infrahilar opacity corresponding to nodule on prior CT where it is better characterized. Right port catheter tip projects over lower SVC. Stable normal cardiac silhouette. No new consolidation, effusion, or pneumothorax. No acute osseous abnormality identified. IMPRESSION: Right infrahilar opacity corresponding to nodule on prior CT. No acute osseous abnormality is evident. Electronically Signed   By: Kristine Garbe M.D.   On: 07/25/2018 20:25   Ct Chest W Contrast  Result Date: 07/14/2018 CLINICAL DATA:  Non-small-cell lung cancer. EXAM: CT CHEST, ABDOMEN, AND PELVIS WITH CONTRAST TECHNIQUE: Multidetector CT imaging of the chest, abdomen and pelvis was performed following the standard protocol during bolus administration of intravenous contrast. CONTRAST:  125mL OMNIPAQUE IOHEXOL 300 MG/ML  SOLN COMPARISON:  PET-CT 05/05/2018. FINDINGS: CT CHEST FINDINGS Cardiovascular: The heart size is normal. No substantial pericardial effusion. Coronary artery calcification is evident. Atherosclerotic calcification is noted in the wall of the thoracic aorta. Right Port-A-Cath tip is positioned in the distal SVC. Mediastinum/Nodes: No mediastinal lymphadenopathy. Right infrahilar mass measured previously at 3.4 x 3.4 cm  now measures 2.2 x 2.9 cm (38/2). No left hilar lymphadenopathy. No right axillary lymphadenopathy. The left axillary lesion measures 2.2 x 1.5 cm today compared to 2.9 x 2.3 cm previously. Lungs/Pleura: Left upper lobe nodule measures 1.9 cm today (79/7) compared to 2.2 cm previously. Interval progression of right infrahilar volume loss and airspace disease, likely related to radiation therapy. No pleural effusion. Musculoskeletal: No worrisome lytic or sclerotic osseous abnormality. CT ABDOMEN PELVIS FINDINGS Hepatobiliary: 1.7 cm cyst identified posterior left liver, stable. There is no evidence for gallstones, gallbladder wall thickening, or pericholecystic fluid. No intrahepatic or extrahepatic biliary dilation. Pancreas: No focal mass lesion. No dilatation of the main duct. No intraparenchymal cyst. No peripancreatic edema. Spleen: No splenomegaly. No focal mass lesion. Adrenals/Urinary Tract: No adrenal nodule or mass. Tiny bilateral nonobstructing renal stones again identified. No evidence for hydroureter. The urinary bladder appears normal for the degree of distention. Stomach/Bowel: Stomach is  nondistended. No gastric wall thickening. No evidence of outlet obstruction. Duodenum is normally positioned as is the ligament of Treitz. No small bowel wall thickening. No small bowel dilatation. The terminal ileum is normal. The appendix is normal. Diverticular changes are noted in the left colon without evidence of diverticulitis. Vascular/Lymphatic: There is abdominal aortic atherosclerosis without aneurysm. There is no gastrohepatic or hepatoduodenal ligament lymphadenopathy. No intraperitoneal or retroperitoneal lymphadenopathy. No pelvic sidewall lymphadenopathy. Reproductive: Prostate gland upper normal for size. Other: No intraperitoneal free fluid. Musculoskeletal: No worrisome lytic or sclerotic osseous abnormality. IMPRESSION: 1. Interval decrease in size of the right infrahilar lesion and left axillary lymphadenopathy. 2. Slight interval decrease in the left upper lobe pulmonary nodule. 3. No new or progressive interval findings. 4. Bilateral nonobstructing renal stones. 5.  Aortic Atherosclerois (ICD10-170.0) Electronically Signed   By: Misty Stanley M.D.   On: 07/14/2018 14:09   Ct Abdomen Pelvis W Contrast  Result Date: 07/14/2018 CLINICAL DATA:  Non-small-cell lung cancer. EXAM: CT CHEST, ABDOMEN, AND PELVIS WITH CONTRAST TECHNIQUE: Multidetector CT imaging of the chest, abdomen and pelvis was performed following the standard protocol during bolus administration of intravenous contrast. CONTRAST:  181mL OMNIPAQUE IOHEXOL 300 MG/ML  SOLN COMPARISON:  PET-CT 05/05/2018. FINDINGS: CT CHEST FINDINGS Cardiovascular: The heart size is normal. No substantial pericardial effusion. Coronary artery calcification is evident. Atherosclerotic calcification is noted in the wall of the thoracic aorta. Right Port-A-Cath tip is positioned in the distal SVC. Mediastinum/Nodes: No mediastinal lymphadenopathy. Right infrahilar mass measured previously at 3.4 x 3.4 cm now measures 2.2 x 2.9 cm (38/2). No left hilar  lymphadenopathy. No right axillary lymphadenopathy. The left axillary lesion measures 2.2 x 1.5 cm today compared to 2.9 x 2.3 cm previously. Lungs/Pleura: Left upper lobe nodule measures 1.9 cm today (79/7) compared to 2.2 cm previously. Interval progression of right infrahilar volume loss and airspace disease, likely related to radiation therapy. No pleural effusion. Musculoskeletal: No worrisome lytic or sclerotic osseous abnormality. CT ABDOMEN PELVIS FINDINGS Hepatobiliary: 1.7 cm cyst identified posterior left liver, stable. There is no evidence for gallstones, gallbladder wall thickening, or pericholecystic fluid. No intrahepatic or extrahepatic biliary dilation. Pancreas: No focal mass lesion. No dilatation of the main duct. No intraparenchymal cyst. No peripancreatic edema. Spleen: No splenomegaly. No focal mass lesion. Adrenals/Urinary Tract: No adrenal nodule or mass. Tiny bilateral nonobstructing renal stones again identified. No evidence for hydroureter. The urinary bladder appears normal for the degree of distention. Stomach/Bowel: Stomach is nondistended. No gastric wall thickening. No evidence of outlet obstruction. Duodenum is normally positioned as  is the ligament of Treitz. No small bowel wall thickening. No small bowel dilatation. The terminal ileum is normal. The appendix is normal. Diverticular changes are noted in the left colon without evidence of diverticulitis. Vascular/Lymphatic: There is abdominal aortic atherosclerosis without aneurysm. There is no gastrohepatic or hepatoduodenal ligament lymphadenopathy. No intraperitoneal or retroperitoneal lymphadenopathy. No pelvic sidewall lymphadenopathy. Reproductive: Prostate gland upper normal for size. Other: No intraperitoneal free fluid. Musculoskeletal: No worrisome lytic or sclerotic osseous abnormality. IMPRESSION: 1. Interval decrease in size of the right infrahilar lesion and left axillary lymphadenopathy. 2. Slight interval decrease in  the left upper lobe pulmonary nodule. 3. No new or progressive interval findings. 4. Bilateral nonobstructing renal stones. 5.  Aortic Atherosclerois (ICD10-170.0) Electronically Signed   By: Misty Stanley M.D.   On: 07/14/2018 14:09     ASSESSMENT/PLAN:  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[place]) This is a very pleasant 72 year old white male with very light most smoking history recently diagnosed with stage IV (T3, N0, M1c) non-small cell lung cancer, squamous cell carcinoma based on the biopsy from the left axillary mass. The patient is currently undergoing systemic chemotherapy with carboplatin, paclitaxel and Keytruda status post 4 cycles.  He tolerated this treatment well with no concerning complaints except for generalized fatigue and weakness and also aching pain after the Neulasta injection.  He is now here to begin cycle 1 of maintenance Keytruda. Adverse effects of this treatment were reviewed including but not limited to immune mediated the skin rash, diarrhea, inflammation of the lung, kidney, liver, thyroid or other endocrine dysfunction.  The patient is agreeable to proceeding.  He will follow-up in 3 weeks for evaluation prior to cycle #2 of maintenance Keytruda.  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  He was advised to take his levothyroxine as prescribed.  We will monitor his TSH periodically while on treatment with Keytruda and adjust levothyroxine as needed.  For chest discomfort, he may continue oxycodone.  He was encouraged to transition to using Tylenol or ibuprofen.  For anxiety, he was given a refill of Xanax today.  The patient was advised to call immediately if he has any concerning symptoms in the interval. The patient voices understanding of current disease status and treatment options and is in agreement with the current care plan. All questions were answered. The patient knows to call the clinic with any problems,  questions or concerns. We can certainly see the patient much sooner if necessary.   No orders of the defined types were placed in this encounter.    Mikey Bussing, DNP, AGPCNP-BC, AOCNP 08/11/18

## 2018-08-12 ENCOUNTER — Other Ambulatory Visit: Payer: Medicare Other

## 2018-08-12 ENCOUNTER — Ambulatory Visit: Payer: Medicare Other

## 2018-08-12 ENCOUNTER — Ambulatory Visit: Payer: Medicare Other | Admitting: Internal Medicine

## 2018-08-16 ENCOUNTER — Encounter: Payer: Self-pay | Admitting: Oncology

## 2018-08-18 ENCOUNTER — Inpatient Hospital Stay (HOSPITAL_BASED_OUTPATIENT_CLINIC_OR_DEPARTMENT_OTHER): Payer: Medicare Other | Admitting: Medical

## 2018-08-18 ENCOUNTER — Other Ambulatory Visit: Payer: Medicare Other

## 2018-08-18 ENCOUNTER — Telehealth: Payer: Self-pay | Admitting: *Deleted

## 2018-08-18 VITALS — BP 112/71 | HR 72 | Temp 98.1°F | Resp 18 | Ht 72.0 in | Wt 238.1 lb

## 2018-08-18 DIAGNOSIS — Z5112 Encounter for antineoplastic immunotherapy: Secondary | ICD-10-CM | POA: Diagnosis not present

## 2018-08-18 DIAGNOSIS — C773 Secondary and unspecified malignant neoplasm of axilla and upper limb lymph nodes: Secondary | ICD-10-CM

## 2018-08-18 DIAGNOSIS — Z85828 Personal history of other malignant neoplasm of skin: Secondary | ICD-10-CM

## 2018-08-18 DIAGNOSIS — F419 Anxiety disorder, unspecified: Secondary | ICD-10-CM

## 2018-08-18 DIAGNOSIS — Z79899 Other long term (current) drug therapy: Secondary | ICD-10-CM

## 2018-08-18 DIAGNOSIS — I10 Essential (primary) hypertension: Secondary | ICD-10-CM | POA: Diagnosis not present

## 2018-08-18 DIAGNOSIS — Z923 Personal history of irradiation: Secondary | ICD-10-CM

## 2018-08-18 DIAGNOSIS — C7802 Secondary malignant neoplasm of left lung: Secondary | ICD-10-CM

## 2018-08-18 DIAGNOSIS — E039 Hypothyroidism, unspecified: Secondary | ICD-10-CM

## 2018-08-18 DIAGNOSIS — H539 Unspecified visual disturbance: Secondary | ICD-10-CM | POA: Diagnosis not present

## 2018-08-18 DIAGNOSIS — R5383 Other fatigue: Secondary | ICD-10-CM | POA: Diagnosis not present

## 2018-08-18 DIAGNOSIS — E785 Hyperlipidemia, unspecified: Secondary | ICD-10-CM | POA: Diagnosis not present

## 2018-08-18 DIAGNOSIS — I2510 Atherosclerotic heart disease of native coronary artery without angina pectoris: Secondary | ICD-10-CM

## 2018-08-18 DIAGNOSIS — C3491 Malignant neoplasm of unspecified part of right bronchus or lung: Secondary | ICD-10-CM | POA: Diagnosis not present

## 2018-08-18 DIAGNOSIS — R531 Weakness: Secondary | ICD-10-CM | POA: Diagnosis not present

## 2018-08-18 DIAGNOSIS — G620 Drug-induced polyneuropathy: Secondary | ICD-10-CM | POA: Diagnosis not present

## 2018-08-18 NOTE — Telephone Encounter (Signed)
Received VM message from patient regarding difficulty with his vision.  TC back to patient and spoke with him.  He states he is noticing visual changes in his left eye "vertical light spikes, dust spots in the peripheral vision and also cloudiness that drifts across the visual field in his left eye" He is a patient of  Dr. Glendell Docker is currently receiving Keytruda for Stage 4 lung cancer. Discussed with Sandi Mealy, PA. OK for pt to come in to be evaluated in [facility] this afternoon. Pt made aware of [facility] appt @ 1:30 today. He voiced understanding and gratitude for being able to come in today.

## 2018-08-19 ENCOUNTER — Telehealth: Payer: Self-pay

## 2018-08-19 ENCOUNTER — Ambulatory Visit (HOSPITAL_COMMUNITY)
Admission: RE | Admit: 2018-08-19 | Discharge: 2018-08-19 | Disposition: A | Discharge status: Home / Self Care | Payer: Medicare Other | Source: Ambulatory Visit | Attending: Medical | Admitting: Medical

## 2018-08-19 DIAGNOSIS — C3491 Malignant neoplasm of unspecified part of right bronchus or lung: Secondary | ICD-10-CM | POA: Diagnosis not present

## 2018-08-19 DIAGNOSIS — H538 Other visual disturbances: Secondary | ICD-10-CM | POA: Diagnosis not present

## 2018-08-19 DIAGNOSIS — H539 Unspecified visual disturbance: Secondary | ICD-10-CM

## 2018-08-19 MED ORDER — HEPARIN SOD (PORK) LOCK FLUSH 100 UNIT/ML IV SOLN
INTRAVENOUS | Status: AC
  Filled 2018-08-19: qty 5

## 2018-08-19 MED ORDER — IOHEXOL 300 MG/ML  SOLN
75.0000 mL | Freq: Once | INTRAMUSCULAR | Status: AC | PRN
  Administered 2018-08-19: 75 mL via INTRAVENOUS

## 2018-08-19 MED ORDER — SODIUM CHLORIDE (PF) 0.9 % IJ SOLN
INTRAMUSCULAR | Status: AC
  Filled 2018-08-19: qty 50

## 2018-08-19 MED ORDER — HEPARIN SOD (PORK) LOCK FLUSH 100 UNIT/ML IV SOLN
500.0000 [IU] | Freq: Once | INTRAVENOUS | Status: AC
  Administered 2018-08-19: 500 [IU] via INTRAVENOUS

## 2018-08-19 NOTE — Telephone Encounter (Signed)
Spoke with pt by phone to give results of CT head.

## 2018-08-20 ENCOUNTER — Telehealth: Payer: Self-pay | Admitting: *Deleted

## 2018-08-20 ENCOUNTER — Other Ambulatory Visit: Payer: Self-pay | Admitting: Oncology

## 2018-08-20 MED ORDER — ALPRAZOLAM 0.25 MG PO TABS: 0.2500 mg | ORAL_TABLET | Freq: Every evening | ORAL | 0 refills | Status: DC | PRN

## 2018-08-20 NOTE — Progress Notes (Signed)
These results were called to [name] and were reviewed with him . His were answered. He expressed understanding.

## 2018-08-20 NOTE — Telephone Encounter (Signed)
Mr [employer] left a message that a prescription for Xanax was sent to [facility] in [place]- they are not able to fill for some reason, would like it to be sent to [place] in [place].

## 2018-08-20 NOTE — Telephone Encounter (Signed)
We need to find out why they cannot refill. I cannot just send another prescription for a controlled substance to another pharmacy without finding out why it cannot be refilled. It is within the appropriate timeframe so not sure what the issue is. Please call the pharmacy and see what the issue is.

## 2018-08-21 NOTE — Progress Notes (Signed)
[facility] Progress Note   Travis Padilla [id number] [birth year] 72 y.o.  Travis Padilla is managed by Dr. Eilleen Kempf  Actively treated with chemotherapy/immunotherapy/hormonal therapy: yes  Current Therapy: Nat Math  Last Treated: 08/11/2018 (Cycle 6 Day 1)  Assessment: Plan:    Visual changes - Plan: CT Head W Wo Contrast  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[place]) - Plan: CT Head W Wo Contrast   1) Visual changes: The patient was referred for a head CT scan.  2) Stage IV non-small cell lung cancer:  The patient is s/p cycle 6 day 1 of [place].  He is scheduled ofr his next treatment on 09/01/2018 and will see Dr. Julien Nordmann for a follow up on 09/22/2018.  Please see After Visit Summary for patient specific instructions.  Future Appointments  Date Time Provider [place]  09/01/2018  7:45 AM [facility] LAB 5 [facility] None  09/01/2018  8:00 AM [facility] INFUSION [facility] None  09/01/2018  8:30 AM Curt Bears, MD [facility] None  09/01/2018  9:00 AM [facility] INFUSION [facility] None  09/22/2018  1:15 PM [facility] LAB 6 [facility] None  09/22/2018  1:30 PM [facility] [place] FLUSH [facility] None  09/22/2018  2:00 PM Curt Bears, MD [facility] None  09/22/2018  3:00 PM [facility] INFUSION [facility] None  11/13/2018 10:15 AM [name], MD [facility] None    Orders Placed This Encounter  Procedures  . CT Head W Wo Contrast       Subjective:   Patient ID:  Travis Padilla is a 72 y.o. (DOB 1945/12/01) male.  Chief Complaint:  Chief Complaint  Patient presents with  . visual changes    HPI Travis Padilla is a 72 y.o. male with a stage IV non small cell lung cancer (squamous cell carcinoma) who is managed by Dr. Julien Nordmann.  He was last treated with Keytruda with cycle 6 day 1 dosed on 08/11/2018.  He presents today with visual changes since last Thursday.  He notes intermittent cloudiness of his left eye.  He  has a 1.5 month history of right sided weakness which is stable.  He questions if his visual changes could be due to recent changes in his thyroid medication.  He denies headaches or other issues of concern.  Medications: I have reviewed the patient's current medications.  Allergies:  Allergies  Allergen Reactions  . Trazodone And Nefazodone Palpitations    Past Medical History:  Diagnosis Date  . Abnormal nuclear stress test    December, 2013  . Anemia   . CAD (coronary artery disease)    Mild nonobstructive plaque in cath 2013  . Chest pain    December, 2013  . Dizziness   . Gout   . Hemorrhoid   . Hyperlipidemia   . Hypertension   . Skin cancer   . Spinal stenosis     Past Surgical History:  Procedure Laterality Date  . 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 BELPHAROPTOSIS REPAIR Bilateral   . CATARACT EXTRACTION W/ INTRAOCULAR LENS  IMPLANT, BILATERAL    . COLONOSCOPY N/A 11/03/2014   Procedure: COLONOSCOPY;  Surgeon: Rogene Houston, MD;  [facility];  Service: Endoscopy;  Laterality: N/A;  1225  . IR IMAGING GUIDED PORT INSERTION  07/11/2018  . KNEE CARTILAGE SURGERY Left    Left knee  . SKIN CANCER EXCISION  12/2014, 04/25/15  . VIDEO BRONCHOSCOPY Bilateral 05/09/2018   Procedure: VIDEO BRONCHOSCOPY WITH  FLUORO;  Surgeon: Juanito Doom, MD;  Location: [facility];  Service: Cardiopulmonary;  Laterality: Bilateral;    Family History  Problem Relation Age of Onset  . Heart attack Father   . Heart failure Mother   . Parkinson's disease Mother   . Healthy Sister   . Skin cancer Sister   . Neuropathy Brother   . COPD Brother   . Epilepsy Brother   . Healthy Sister   . Healthy Sister   . Colon cancer Neg Hx     Social History   Socioeconomic History  . Marital status: Married    Spouse name: Not on file  . Number of children: 1  . Years of education: Not on file  . Highest education level: Not on file  Occupational History  . Occupation:  Government social research officer  Social Needs  . Financial resource strain: Not on file  . Food insecurity:    Worry: Not on file    Inability: Not on file  . Transportation needs:    Medical: Not on file    Non-medical: Not on file  Tobacco Use  . Smoking status: Former Smoker    Packs/day: 0.20    Years: 2.00    Pack years: 0.40    Types: Cigarettes    Start date: 10/01/1968  . Smokeless tobacco: Never Used  Substance and Sexual Activity  . Alcohol use: No    Comment: hx of   . Drug use: No  . Sexual activity: Not on file  Lifestyle  . Physical activity:    Days per week: Not on file    Minutes per session: Not on file  . Stress: Not on file  Relationships  . Social connections:    Talks on phone: Not on file    Gets together: Not on file    Attends religious service: Not on file    Active member of club or organization: Not on file    Attends meetings of clubs or organizations: Not on file    Relationship status: Not on file  . Intimate partner violence:    Fear of current or ex partner: Not on file    Emotionally abused: Not on file    Physically abused: Not on file    Forced sexual activity: Not on file  Other Topics Concern  . Not on file  Social History Narrative   Unable to ask intimate partner violence questions, wife present    Past Medical History, Surgical history, Social history, and Family history were reviewed and updated as appropriate.   Please see review of systems for further details on the patient's review from today.   Review of Systems:  Review of Systems  Constitutional: Negative for chills, diaphoresis and fever.  HENT: Negative for trouble swallowing and voice change.   Eyes: Positive for visual disturbance. Negative for photophobia, pain, discharge and itching.  Respiratory: Negative for cough, chest tightness, shortness of breath and wheezing.   Cardiovascular: Negative for chest pain and palpitations.  Gastrointestinal: Negative for abdominal pain,  constipation, diarrhea, nausea and vomiting.  Musculoskeletal: Negative for back pain and myalgias.  Neurological: Negative for dizziness, light-headedness and headaches.    Objective:   Physical Exam:  BP 112/71 (BP Location: Left Arm, Patient Position: Sitting)   Pulse 72   Temp 98.1 F (36.7 C) (Oral)   Resp 18   Ht 6' (1.829 m)   Wt 238 lb 1.6 oz (108 kg)   SpO2 99%   BMI  32.29 kg/m  ECOG: 0  Physical Exam Constitutional:      General: He is not in acute distress.    Appearance: He is not diaphoretic.  HENT:     Head: Normocephalic and atraumatic.  Eyes:     General:        Right eye: No discharge.        Left eye: No discharge.     Extraocular Movements:     Right eye: Normal extraocular motion and no nystagmus.     Left eye: Normal extraocular motion and no nystagmus.     Conjunctiva/sclera:     Right eye: Right conjunctiva is not injected. No exudate or hemorrhage.    Left eye: Left conjunctiva is not injected. No exudate or hemorrhage.    Pupils: Pupils are equal, round, and reactive to light.     Right eye: Pupil is reactive.     Left eye: Pupil is reactive.     Funduscopic exam:    Right eye: No hemorrhage, exudate, AV nicking, arteriolar narrowing or papilledema.        Left eye: No hemorrhage, exudate, AV nicking, arteriolar narrowing or papilledema.  Cardiovascular:     Rate and Rhythm: Normal rate and regular rhythm.     Heart sounds: Normal heart sounds. No murmur. No friction rub. No gallop.   Pulmonary:     Effort: Pulmonary effort is normal. No respiratory distress.     Breath sounds: Normal breath sounds. No wheezing or rales.  Skin:    General: Skin is warm and dry.     Findings: No erythema or rash.  Neurological:     Mental Status: He is alert.     Motor: No weakness or abnormal muscle tone.     Coordination: Coordination normal.     Lab Review:     Component Value Date/Time   NA 142 08/11/2018 1121   NA 144 04/09/2018 1507   K 4.3  08/11/2018 1121   CL 109 08/11/2018 1121   CO2 25 08/11/2018 1121   GLUCOSE 99 08/11/2018 1121   BUN 13 08/11/2018 1121   BUN 15 04/09/2018 1507   CREATININE 1.09 08/11/2018 1121   CALCIUM 9.1 08/11/2018 1121   PROT 7.4 08/11/2018 1121   PROT 7.2 05/28/2017 1417   ALBUMIN 3.8 08/11/2018 1121   ALBUMIN 4.2 05/28/2017 1417   AST 17 08/11/2018 1121   ALT 14 08/11/2018 1121   ALKPHOS 78 08/11/2018 1121   BILITOT 0.4 08/11/2018 1121   GFRNONAA >60 08/11/2018 1121   GFRAA >60 08/11/2018 1121       Component Value Date/Time   WBC 6.2 08/11/2018 1121   WBC 28.8 (H) 07/25/2018 2031   RBC 3.59 (L) 08/11/2018 1121   HGB 11.1 (L) 08/11/2018 1121   HGB 13.0 12/04/2017 1202   HCT 34.1 (L) 08/11/2018 1121   HCT 39.6 12/04/2017 1202   PLT 153 08/11/2018 1121   PLT 218 12/04/2017 1202   MCV 95.0 08/11/2018 1121   MCV 92 12/04/2017 1202   MCH 30.9 08/11/2018 1121   MCHC 32.6 08/11/2018 1121   RDW 17.2 (H) 08/11/2018 1121   RDW 13.8 12/04/2017 1202   LYMPHSABS 0.8 08/11/2018 1121   LYMPHSABS 1.8 12/04/2017 1202   MONOABS 1.0 08/11/2018 1121   EOSABS 0.3 08/11/2018 1121   EOSABS 0.2 12/04/2017 1202   BASOSABS 0.1 08/11/2018 1121   BASOSABS 0.0 12/04/2017 1202   -------------------------------  Imaging from last 24 hours (if applicable):  Radiology  interpretation: Dg Ribs Unilateral W/chest Right  Result Date: 07/25/2018 CLINICAL DATA:  72 y/o M; right upper quadrant chest pain at site of lung cancer radiation therapy. EXAM: RIGHT RIBS AND CHEST - 3+ VIEW COMPARISON:  07/14/2018 CT chest. 05/09/2018 chest radiograph. FINDINGS: Right infrahilar opacity corresponding to nodule on prior CT where it is better characterized. Right port catheter tip projects over lower SVC. Stable normal cardiac silhouette. No new consolidation, effusion, or pneumothorax. No acute osseous abnormality identified. IMPRESSION: Right infrahilar opacity corresponding to nodule on prior CT. No acute osseous  abnormality is evident. Electronically Signed   By: Kristine Garbe M.D.   On: 07/25/2018 20:25   Ct Head W Wo Contrast  Result Date: 08/19/2018 CLINICAL DATA:  Left-sided visual disturbance for 5 days. History of lung cancer. EXAM: CT HEAD WITHOUT AND WITH CONTRAST TECHNIQUE: Contiguous axial images were obtained from the base of the skull through the vertex without and with intravenous contrast CONTRAST:  84mL OMNIPAQUE IOHEXOL 300 MG/ML  SOLN COMPARISON:  05/09/2018 FINDINGS: Brain: There is no evidence of acute infarct, intracranial hemorrhage, mass, midline shift, or extra-axial fluid collection. Mild-to-moderate enlargement of the lateral greater than third ventricles is unchanged, again without temporal horn dilatation and favored to reflect central predominant cerebral atrophy rather than hydrocephalus. No abnormal enhancement is identified. Vascular: Calcified atherosclerosis at the skull base. Grossly patent major dural venous sinuses and large arteries at the base of the brain. Skull: No fracture or focal osseous lesion. Sinuses/Orbits: Visualized paranasal sinuses and mastoid air cells are clear. Bilateral cataract extraction is noted. Other: None. IMPRESSION: Unchanged appearance of the brain without evidence of acute abnormality or metastatic disease. Electronically Signed   By: Logan Bores M.D.   On: 08/19/2018 12:07

## 2018-08-25 ENCOUNTER — Other Ambulatory Visit: Payer: Medicare Other

## 2018-08-31 ENCOUNTER — Other Ambulatory Visit: Payer: Self-pay | Admitting: Medical

## 2018-08-31 DIAGNOSIS — R630 Anorexia: Secondary | ICD-10-CM

## 2018-08-31 DIAGNOSIS — G4700 Insomnia, unspecified: Secondary | ICD-10-CM

## 2018-09-01 ENCOUNTER — Inpatient Hospital Stay: Payer: Medicare Other

## 2018-09-01 ENCOUNTER — Telehealth: Payer: Self-pay | Admitting: Internal Medicine

## 2018-09-01 ENCOUNTER — Encounter: Payer: Self-pay | Admitting: Internal Medicine

## 2018-09-01 ENCOUNTER — Other Ambulatory Visit: Payer: Self-pay | Admitting: Internal Medicine

## 2018-09-01 ENCOUNTER — Other Ambulatory Visit: Payer: Medicare Other

## 2018-09-01 ENCOUNTER — Inpatient Hospital Stay (HOSPITAL_BASED_OUTPATIENT_CLINIC_OR_DEPARTMENT_OTHER): Payer: Medicare Other | Admitting: Internal Medicine

## 2018-09-01 VITALS — BP 122/79 | HR 73 | Temp 98.1°F | Resp 17 | Ht 72.0 in | Wt 240.6 lb

## 2018-09-01 DIAGNOSIS — C3490 Malignant neoplasm of unspecified part of unspecified bronchus or lung: Secondary | ICD-10-CM

## 2018-09-01 DIAGNOSIS — C773 Secondary and unspecified malignant neoplasm of axilla and upper limb lymph nodes: Secondary | ICD-10-CM

## 2018-09-01 DIAGNOSIS — R0789 Other chest pain: Secondary | ICD-10-CM | POA: Diagnosis not present

## 2018-09-01 DIAGNOSIS — E039 Hypothyroidism, unspecified: Secondary | ICD-10-CM | POA: Diagnosis not present

## 2018-09-01 DIAGNOSIS — Z923 Personal history of irradiation: Secondary | ICD-10-CM | POA: Diagnosis not present

## 2018-09-01 DIAGNOSIS — G620 Drug-induced polyneuropathy: Secondary | ICD-10-CM

## 2018-09-01 DIAGNOSIS — I10 Essential (primary) hypertension: Secondary | ICD-10-CM

## 2018-09-01 DIAGNOSIS — C3491 Malignant neoplasm of unspecified part of right bronchus or lung: Secondary | ICD-10-CM

## 2018-09-01 DIAGNOSIS — R531 Weakness: Secondary | ICD-10-CM

## 2018-09-01 DIAGNOSIS — R5383 Other fatigue: Secondary | ICD-10-CM | POA: Diagnosis not present

## 2018-09-01 DIAGNOSIS — Z5112 Encounter for antineoplastic immunotherapy: Secondary | ICD-10-CM | POA: Diagnosis not present

## 2018-09-01 DIAGNOSIS — F419 Anxiety disorder, unspecified: Secondary | ICD-10-CM | POA: Diagnosis not present

## 2018-09-01 DIAGNOSIS — I2510 Atherosclerotic heart disease of native coronary artery without angina pectoris: Secondary | ICD-10-CM

## 2018-09-01 DIAGNOSIS — Z95828 Presence of other vascular implants and grafts: Secondary | ICD-10-CM

## 2018-09-01 DIAGNOSIS — Z85828 Personal history of other malignant neoplasm of skin: Secondary | ICD-10-CM

## 2018-09-01 DIAGNOSIS — C7802 Secondary malignant neoplasm of left lung: Secondary | ICD-10-CM

## 2018-09-01 DIAGNOSIS — Z5111 Encounter for antineoplastic chemotherapy: Secondary | ICD-10-CM

## 2018-09-01 DIAGNOSIS — Z87891 Personal history of nicotine dependence: Secondary | ICD-10-CM

## 2018-09-01 DIAGNOSIS — Z79899 Other long term (current) drug therapy: Secondary | ICD-10-CM

## 2018-09-01 DIAGNOSIS — T451X5S Adverse effect of antineoplastic and immunosuppressive drugs, sequela: Secondary | ICD-10-CM | POA: Diagnosis not present

## 2018-09-01 DIAGNOSIS — H538 Other visual disturbances: Secondary | ICD-10-CM | POA: Diagnosis not present

## 2018-09-01 DIAGNOSIS — E785 Hyperlipidemia, unspecified: Secondary | ICD-10-CM

## 2018-09-01 LAB — CBC WITH DIFFERENTIAL (CANCER CENTER ONLY)
Abs Immature Granulocytes: 0.02 10*3/uL (ref 0.00–0.07)
BASOS ABS: 0 10*3/uL (ref 0.0–0.1)
Basophils Relative: 1 %
EOS PCT: 9 %
Eosinophils Absolute: 0.4 10*3/uL (ref 0.0–0.5)
HCT: 33.4 % — ABNORMAL LOW (ref 39.0–52.0)
Hemoglobin: 11.1 g/dL — ABNORMAL LOW (ref 13.0–17.0)
Immature Granulocytes: 1 %
LYMPHS PCT: 14 %
Lymphs Abs: 0.6 10*3/uL — ABNORMAL LOW (ref 0.7–4.0)
MCH: 32.2 pg (ref 26.0–34.0)
MCHC: 33.2 g/dL (ref 30.0–36.0)
MCV: 96.8 fL (ref 80.0–100.0)
Monocytes Absolute: 0.6 10*3/uL (ref 0.1–1.0)
Monocytes Relative: 14 %
Neutro Abs: 2.7 10*3/uL (ref 1.7–7.7)
Neutrophils Relative %: 61 %
Platelet Count: 168 10*3/uL (ref 150–400)
RBC: 3.45 MIL/uL — ABNORMAL LOW (ref 4.22–5.81)
RDW: 15.3 % (ref 11.5–15.5)
WBC Count: 4.4 10*3/uL (ref 4.0–10.5)
nRBC: 0 % (ref 0.0–0.2)

## 2018-09-01 LAB — CMP (CANCER CENTER ONLY)
ALT: 13 U/L (ref 0–44)
ANION GAP: 8 (ref 5–15)
AST: 17 U/L (ref 15–41)
Albumin: 3.8 g/dL (ref 3.5–5.0)
Alkaline Phosphatase: 63 U/L (ref 38–126)
BUN: 12 mg/dL (ref 8–23)
CO2: 26 mmol/L (ref 22–32)
Calcium: 9.2 mg/dL (ref 8.9–10.3)
Chloride: 107 mmol/L (ref 98–111)
Creatinine: 0.91 mg/dL (ref 0.61–1.24)
GFR, Est AFR Am: >60 mL/min (ref >60)
GFR, Estimated: >60 mL/min (ref >60)
GLUCOSE: 129 mg/dL — AB (ref 70–99)
Potassium: 3.8 mmol/L (ref 3.5–5.1)
Sodium: 141 mmol/L (ref 135–145)
Total Bilirubin: 0.4 mg/dL (ref 0.3–1.2)
Total Protein: 7.5 g/dL (ref 6.5–8.1)

## 2018-09-01 LAB — TSH: TSH: 74.503 u[IU]/mL — ABNORMAL HIGH (ref 0.320–4.118)

## 2018-09-01 MED ORDER — HEPARIN SOD (PORK) LOCK FLUSH 100 UNIT/ML IV SOLN
500.0000 [IU] | Freq: Once | INTRAVENOUS | Status: AC | PRN
  Administered 2018-09-01: 500 [IU]
  Filled 2018-09-01: qty 5

## 2018-09-01 MED ORDER — GABAPENTIN 100 MG PO CAPS: 100.0000 mg | ORAL_CAPSULE | Freq: Three times a day (TID) | ORAL | 1 refills | Status: DC

## 2018-09-01 MED ORDER — LEVOTHYROXINE SODIUM 88 MCG PO TABS: 88.0000 ug | ORAL_TABLET | Freq: Every day | ORAL | 1 refills | Status: DC

## 2018-09-01 MED ORDER — SODIUM CHLORIDE 0.9 % IV SOLN
Freq: Once | INTRAVENOUS | Status: AC
  Administered 2018-09-01: 10:00:00 via INTRAVENOUS
  Filled 2018-09-01: qty 250

## 2018-09-01 MED ORDER — SODIUM CHLORIDE 0.9 % IV SOLN
200.0000 mg | Freq: Once | INTRAVENOUS | Status: AC
  Administered 2018-09-01: 200 mg via INTRAVENOUS
  Filled 2018-09-01: qty 8

## 2018-09-01 MED ORDER — SODIUM CHLORIDE 0.9% FLUSH
10.0000 mL | INTRAVENOUS | Status: DC | PRN
  Administered 2018-09-01: 10 mL
  Filled 2018-09-01: qty 10

## 2018-09-01 NOTE — Telephone Encounter (Signed)
Scheduled CT appointment. Printed calendar and avs.

## 2018-09-01 NOTE — Patient Instructions (Signed)
[facility] Discharge Instructions for Patients Receiving Chemotherapy  Today you received the following chemotherapy agents: pembrolizumab Beryle Flock)  To help prevent nausea and vomiting after your treatment, we encourage you to take your nausea medication as directed.    If you develop nausea and vomiting that is not controlled by your nausea medication, call the clinic.   BELOW ARE SYMPTOMS THAT SHOULD BE REPORTED IMMEDIATELY:  *FEVER GREATER THAN 100.5 F  *CHILLS WITH OR WITHOUT FEVER  NAUSEA AND VOMITING THAT IS NOT CONTROLLED WITH YOUR NAUSEA MEDICATION  *UNUSUAL SHORTNESS OF BREATH  *UNUSUAL BRUISING OR BLEEDING  TENDERNESS IN MOUTH AND THROAT WITH OR WITHOUT PRESENCE OF ULCERS  *URINARY PROBLEMS  *BOWEL PROBLEMS  UNUSUAL RASH Items with * indicate a potential emergency and should be followed up as soon as possible.  Feel free to call the clinic should you have any questions or concerns. The clinic phone number is [phone].  Please show the [facility] at check-in to the Emergency Department and triage nurse.

## 2018-09-01 NOTE — Progress Notes (Signed)
Travis Padilla [phone]   [phone]  OFFICE PROGRESS NOTE  Travis Maize, MD [place] [employer] [place] [address]  DIAGNOSIS: Stage IV (T3, N0, M1c) non-small cell lung cancer, squamous cell carcinoma presented with large right infrahilar mass in addition to left upper lobe lung nodule as well as left axillary mass with left axillary lymph node diagnosed in August 2019.  PRIOR THERAPY:  1) Palliative radiotherapy to the right infrahilar mass as well as the axillary mass under the care of Dr. Lisbeth Renshaw. 2) Systemic chemotherapy with carboplatin for AUC of 5, paclitaxel 175 mg/M2 and Keytruda 200 mg IV every 3 weeks status post 4 cycles.  CURRENT THERAPY: Maintenance immunotherapy with single agent Keytruda 200 mg IV every 3 weeks status post 2 cycles.  INTERVAL HISTORY: Travis Padilla 72 y.o. male returns to the clinic today for follow-up visit.  The patient is feeling fine today with no concerning complaints except for the peripheral neuropathy from his previous chemotherapy.  He also has headache for the last few days but CT scan of the head performed recently was unremarkable.  He has no chest pain, shortness of breath, cough or hemoptysis.  He denied having any fever or chills.  He has no nausea, vomiting, diarrhea or constipation.  He is here today for evaluation before starting cycle #3 of his treatment with immunotherapy.   MEDICAL HISTORY: Past Medical History:  Diagnosis Date  . Abnormal nuclear stress test    December, 2013  . Anemia   . CAD (coronary artery disease)    Mild nonobstructive plaque in cath 2013  . Chest pain    December, 2013  . Dizziness   . Gout   . Hemorrhoid   . Hyperlipidemia   . Hypertension   . Skin cancer   . Spinal stenosis     ALLERGIES:  is allergic to trazodone and nefazodone.  MEDICATIONS:  Current Outpatient Medications  Medication Sig Dispense Refill  . albuterol (PROVENTIL) (2.5 MG/3ML) 0.083%  nebulizer solution Take 3 mLs (2.5 mg total) by nebulization every 6 (six) hours as needed for wheezing or shortness of breath. (Patient not taking: Reported on 08/11/2018) 75 mL 12  . ALPRAZolam (XANAX) 0.25 MG tablet Take 1 tablet (0.25 mg total) by mouth at bedtime as needed for anxiety. 30 tablet 0  . Artificial Tear Solution (SOOTHE XP OP) Place 1 drop into both eyes 2 (two) times daily.    . carvedilol (COREG) 12.5 MG tablet TAKE 1 AND 1/2 TABLETS BY MOUTH TWO TIMES DAILY WITH A MEAL (Patient taking differently: Take 18.75 mg by mouth 2 (two) times daily with a meal. ) 270 tablet 3  . clindamycin (CLINDAGEL) 1 % gel Apply topically 2 (two) times daily as needed. (Patient not taking: Reported on 07/25/2018) 30 g 0  . diphenhydramine-acetaminophen (TYLENOL PM) 25-500 MG TABS tablet Take 1-2 tablets by mouth at bedtime as needed (pain).    . Glucosamine-Chondroitin (OSTEO BI-FLEX REGULAR STRENGTH PO) Take 1 tablet by mouth daily.    . hydrocortisone (ANUSOL-HC) 2.5 % rectal cream Place 1 application rectally 4 (four) times daily. (Patient taking differently: Place 1 application rectally 4 (four) times daily as needed for hemorrhoids. ) 30 g 3  . levothyroxine (SYNTHROID, LEVOTHROID) 50 MCG tablet Take 1 tablet (50 mcg total) by mouth daily before breakfast. 30 tablet 1  . lidocaine-prilocaine (EMLA) cream Apply 1 application topically as needed. (Patient taking differently: Apply 1 application  topically daily as needed (port). ) 30 g 0  . mirtazapine (REMERON) 15 MG tablet Take 1 tablet (15 mg total) by mouth at bedtime. (Patient taking differently: Take 15 mg by mouth at bedtime as needed (sleep). ) 30 tablet 5  . Multiple Vitamin (MULTIVITAMIN) tablet Take 1 tablet by mouth daily.    Marland Kitchen oxyCODONE-acetaminophen (PERCOCET/ROXICET) 5-325 MG tablet Take 1 tablet by mouth every 8 (eight) hours as needed for severe pain. 30 tablet 0  . pravastatin (PRAVACHOL) 40 MG tablet TAKE 1 TABLET BY MOUTH EVERY DAY  (Patient taking differently: Take 40 mg by mouth at bedtime. ) 90 tablet 3  . prochlorperazine (COMPAZINE) 10 MG tablet Take 1 tablet (10 mg total) by mouth every 6 (six) hours as needed for nausea or vomiting. 30 tablet 1  . senna-docusate (SENNA S) 8.6-50 MG tablet 1 to 2 twice daily for constipation (Patient taking differently: Take 1 tablet by mouth 2 (two) times daily. ) 120 tablet 5  . Wound Dressings (SONAFINE EX) Apply 1 application topically daily.     No current facility-administered medications for this visit.     SURGICAL HISTORY:  Past Surgical History:  Procedure Laterality Date  . 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 BELPHAROPTOSIS REPAIR Bilateral   . CATARACT EXTRACTION W/ INTRAOCULAR LENS  IMPLANT, BILATERAL    . COLONOSCOPY N/A 11/03/2014   Procedure: COLONOSCOPY;  Surgeon: Rogene Houston, MD;  [facility];  Service: Endoscopy;  Laterality: N/A;  1225  . IR IMAGING GUIDED PORT INSERTION  07/11/2018  . KNEE CARTILAGE SURGERY Left    Left knee  . SKIN CANCER EXCISION  12/2014, 04/25/15  . VIDEO BRONCHOSCOPY Bilateral 05/09/2018   Procedure: VIDEO BRONCHOSCOPY WITH FLUORO;  Surgeon: Juanito Doom, MD;  Location: [facility];  Service: Cardiopulmonary;  Laterality: Bilateral;    REVIEW OF SYSTEMS:  A comprehensive review of systems was negative except for: Neurological: positive for paresthesia   PHYSICAL EXAMINATION: General appearance: alert, cooperative and no distress Head: Normocephalic, without obvious abnormality, atraumatic Neck: no adenopathy, no JVD, supple, symmetrical, trachea midline and thyroid not enlarged, symmetric, no tenderness/mass/nodules Lymph nodes: Cervical, supraclavicular, and axillary nodes normal. Resp: clear to auscultation bilaterally Back: symmetric, no curvature. ROM normal. No CVA tenderness. Cardio: regular rate and rhythm, S1, S2 normal, no murmur, click, rub or gallop GI: soft, non-tender; bowel sounds normal; no  masses,  no organomegaly Extremities: extremities normal, atraumatic, no cyanosis or edema  ECOG PERFORMANCE STATUS: 1 - Symptomatic but completely ambulatory  Blood pressure 122/79, pulse 73, temperature 98.1 F (36.7 C), temperature source Oral, resp. rate 17, height 6' (1.829 m), weight 240 lb 9.6 oz (109.1 kg), SpO2 98 %.  LABORATORY DATA: Lab Results  Component Value Date   WBC 6.2 08/11/2018   HGB 11.1 (L) 08/11/2018   HCT 34.1 (L) 08/11/2018   MCV 95.0 08/11/2018   PLT 153 08/11/2018      Chemistry      Component Value Date/Time   NA 142 08/11/2018 1121   NA 144 04/09/2018 1507   K 4.3 08/11/2018 1121   CL 109 08/11/2018 1121   CO2 25 08/11/2018 1121   BUN 13 08/11/2018 1121   BUN 15 04/09/2018 1507   CREATININE 1.09 08/11/2018 1121      Component Value Date/Time   CALCIUM 9.1 08/11/2018 1121   ALKPHOS 78 08/11/2018 1121   AST 17 08/11/2018 1121   ALT 14 08/11/2018 1121  BILITOT 0.4 08/11/2018 1121       RADIOGRAPHIC STUDIES: Ct Head W Wo Contrast  Result Date: 08/19/2018 CLINICAL DATA:  Left-sided visual disturbance for 5 days. History of lung cancer. EXAM: CT HEAD WITHOUT AND WITH CONTRAST TECHNIQUE: Contiguous axial images were obtained from the base of the skull through the vertex without and with intravenous contrast CONTRAST:  30mL OMNIPAQUE IOHEXOL 300 MG/ML  SOLN COMPARISON:  05/09/2018 FINDINGS: Brain: There is no evidence of acute infarct, intracranial hemorrhage, mass, midline shift, or extra-axial fluid collection. Mild-to-moderate enlargement of the lateral greater than third ventricles is unchanged, again without temporal horn dilatation and favored to reflect central predominant cerebral atrophy rather than hydrocephalus. No abnormal enhancement is identified. Vascular: Calcified atherosclerosis at the skull base. Grossly patent major dural venous sinuses and large arteries at the base of the brain. Skull: No fracture or focal osseous lesion.  Sinuses/Orbits: Visualized paranasal sinuses and mastoid air cells are clear. Bilateral cataract extraction is noted. Other: None. IMPRESSION: Unchanged appearance of the brain without evidence of acute abnormality or metastatic disease. Electronically Signed   By: Logan Bores M.D.   On: 08/19/2018 12:07    ASSESSMENT AND PLAN: This is a very pleasant 72 years old white male with very light most smoking history recently diagnosed with stage IV (T3, N0, M1c) non-small cell lung cancer, squamous cell carcinoma based on the biopsy from the left axillary mass. He status post 4 cycles of induction systemic chemotherapy with carboplatin, paclitaxel and Keytruda with partial response.  The patient is currently on maintenance treatment with single agent Keytruda status post 2 cycles. He is tolerating this treatment well with no concerning adverse effects. I recommended for the patient to proceed with cycle #3 today as scheduled. I will see him back for follow-up visit in 3 weeks for evaluation with repeat CT scan of the chest, abdomen and pelvis for restaging of his disease. For the peripheral neuropathy, I will start the patient on gabapentin 100 mg p.o. 3 times daily. For the hypothyroidism, we will continue with levothyroxine and I will adjust the dose as needed based on his lab work. The patient was advised to call immediately if he has any concerning symptoms in the interval. The patient voices understanding of current disease status and treatment options and is in agreement with the current care plan. All questions were answered. The patient knows to call the clinic with any problems, questions or concerns. We can certainly see the patient much sooner if necessary.  I spent 10 minutes counseling the patient face to face. The total time spent in the appointment was 15 minutes.  Disclaimer: This note was dictated with voice recognition software. Similar sounding words can inadvertently be transcribed  and may not be corrected upon review.

## 2018-09-02 ENCOUNTER — Telehealth: Payer: Self-pay | Admitting: *Deleted

## 2018-09-02 NOTE — Telephone Encounter (Signed)
-----   Message from Curt Bears, MD sent at 09/01/2018  5:34 PM EST ----- Regarding: RE: TSH Query Yes.  His level is higher and I increased his dose and send prescription to his pharmacy. ----- Message ----- From: Johann Capers, RN Sent: 09/01/2018  10:44 AM EST To: Lucile Crater, RN, Curt Bears, MD, # Subject: TSH Query                                      Pt wondering if TSH continuing to increase, if that would be cause to increase the dose of drug. If you could f/u with him that would be great. Thanks!

## 2018-09-02 NOTE — Telephone Encounter (Signed)
Notified of message below

## 2018-09-05 ENCOUNTER — Telehealth: Payer: Self-pay | Admitting: *Deleted

## 2018-09-05 NOTE — Telephone Encounter (Signed)
TC made to patient regarding his elevated TSH level,  Spoke with patient and informed him of Dr. Worthy Flank plan to increase pt's levothyroxine. Pt has picked up new prescription and took 1st dose of 88 mcg this am. He voiced understanding of increase in medication. He states he has felt chilled lately and has had cold feeling in his feet. He states that his feet are already feeling better after taking the levothroxine this am.  He states he has a runny nose-clear mucous. Denies another symptoms.  Advised patient to check for fevers periodically and if any other s/s present themselves, to call us back. Pt voiced understanding. He is aware of his upcoming appts with Dr. Julien Nordmann.

## 2018-09-18 ENCOUNTER — Ambulatory Visit (HOSPITAL_COMMUNITY)
Admission: RE | Admit: 2018-09-18 | Discharge: 2018-09-18 | Disposition: A | Discharge status: Home / Self Care | Payer: Medicare Other | Source: Ambulatory Visit | Attending: Internal Medicine | Admitting: Internal Medicine

## 2018-09-18 DIAGNOSIS — C3490 Malignant neoplasm of unspecified part of unspecified bronchus or lung: Secondary | ICD-10-CM

## 2018-09-18 DIAGNOSIS — R918 Other nonspecific abnormal finding of lung field: Secondary | ICD-10-CM | POA: Diagnosis not present

## 2018-09-18 DIAGNOSIS — N200 Calculus of kidney: Secondary | ICD-10-CM | POA: Diagnosis not present

## 2018-09-18 MED ORDER — HEPARIN SOD (PORK) LOCK FLUSH 100 UNIT/ML IV SOLN
INTRAVENOUS | Status: AC
  Filled 2018-09-18: qty 5

## 2018-09-18 MED ORDER — SODIUM CHLORIDE (PF) 0.9 % IJ SOLN
INTRAMUSCULAR | Status: AC
  Filled 2018-09-18: qty 50

## 2018-09-18 MED ORDER — IOHEXOL 300 MG/ML  SOLN
100.0000 mL | Freq: Once | INTRAMUSCULAR | Status: AC | PRN
  Administered 2018-09-18: 100 mL via INTRAVENOUS

## 2018-09-18 MED ORDER — HEPARIN SOD (PORK) LOCK FLUSH 100 UNIT/ML IV SOLN
500.0000 [IU] | Freq: Once | INTRAVENOUS | Status: AC
  Administered 2018-09-18: 500 [IU] via INTRAVENOUS

## 2018-09-22 ENCOUNTER — Inpatient Hospital Stay: Payer: Medicare Other

## 2018-09-22 ENCOUNTER — Ambulatory Visit: Payer: Medicare Other

## 2018-09-22 ENCOUNTER — Inpatient Hospital Stay (HOSPITAL_BASED_OUTPATIENT_CLINIC_OR_DEPARTMENT_OTHER): Payer: Medicare Other | Admitting: Internal Medicine

## 2018-09-22 ENCOUNTER — Encounter: Payer: Self-pay | Admitting: Internal Medicine

## 2018-09-22 ENCOUNTER — Other Ambulatory Visit: Payer: Medicare Other

## 2018-09-22 ENCOUNTER — Ambulatory Visit: Payer: Medicare Other | Admitting: Internal Medicine

## 2018-09-22 ENCOUNTER — Inpatient Hospital Stay: Payer: Medicare Other | Attending: Oncology

## 2018-09-22 VITALS — BP 118/74 | HR 70 | Temp 98.3°F | Resp 18 | Ht 73.0 in | Wt 239.0 lb

## 2018-09-22 DIAGNOSIS — Z95828 Presence of other vascular implants and grafts: Secondary | ICD-10-CM

## 2018-09-22 DIAGNOSIS — Z85828 Personal history of other malignant neoplasm of skin: Secondary | ICD-10-CM | POA: Diagnosis not present

## 2018-09-22 DIAGNOSIS — Z79899 Other long term (current) drug therapy: Secondary | ICD-10-CM

## 2018-09-22 DIAGNOSIS — T451X5S Adverse effect of antineoplastic and immunosuppressive drugs, sequela: Secondary | ICD-10-CM | POA: Diagnosis not present

## 2018-09-22 DIAGNOSIS — C3491 Malignant neoplasm of unspecified part of right bronchus or lung: Secondary | ICD-10-CM

## 2018-09-22 DIAGNOSIS — R5383 Other fatigue: Secondary | ICD-10-CM

## 2018-09-22 DIAGNOSIS — C7802 Secondary malignant neoplasm of left lung: Secondary | ICD-10-CM

## 2018-09-22 DIAGNOSIS — G620 Drug-induced polyneuropathy: Secondary | ICD-10-CM | POA: Insufficient documentation

## 2018-09-22 DIAGNOSIS — Z5112 Encounter for antineoplastic immunotherapy: Secondary | ICD-10-CM

## 2018-09-22 DIAGNOSIS — Z923 Personal history of irradiation: Secondary | ICD-10-CM

## 2018-09-22 DIAGNOSIS — E785 Hyperlipidemia, unspecified: Secondary | ICD-10-CM | POA: Diagnosis not present

## 2018-09-22 DIAGNOSIS — I10 Essential (primary) hypertension: Secondary | ICD-10-CM

## 2018-09-22 DIAGNOSIS — C773 Secondary and unspecified malignant neoplasm of axilla and upper limb lymph nodes: Secondary | ICD-10-CM

## 2018-09-22 DIAGNOSIS — Z5111 Encounter for antineoplastic chemotherapy: Secondary | ICD-10-CM

## 2018-09-22 DIAGNOSIS — I2510 Atherosclerotic heart disease of native coronary artery without angina pectoris: Secondary | ICD-10-CM | POA: Insufficient documentation

## 2018-09-22 DIAGNOSIS — C7801 Secondary malignant neoplasm of right lung: Secondary | ICD-10-CM

## 2018-09-22 DIAGNOSIS — E032 Hypothyroidism due to medicaments and other exogenous substances: Secondary | ICD-10-CM

## 2018-09-22 LAB — CBC WITH DIFFERENTIAL (CANCER CENTER ONLY)
ABS IMMATURE GRANULOCYTES: 0.01 10*3/uL (ref 0.00–0.07)
BASOS PCT: 0 %
Basophils Absolute: 0 10*3/uL (ref 0.0–0.1)
Eosinophils Absolute: 0.3 10*3/uL (ref 0.0–0.5)
Eosinophils Relative: 6 %
HCT: 34.4 % — ABNORMAL LOW (ref 39.0–52.0)
Hemoglobin: 11.4 g/dL — ABNORMAL LOW (ref 13.0–17.0)
Immature Granulocytes: 0 %
Lymphocytes Relative: 22 %
Lymphs Abs: 1.1 10*3/uL (ref 0.7–4.0)
MCH: 32.2 pg (ref 26.0–34.0)
MCHC: 33.1 g/dL (ref 30.0–36.0)
MCV: 97.2 fL (ref 80.0–100.0)
Monocytes Absolute: 0.8 10*3/uL (ref 0.1–1.0)
Monocytes Relative: 15 %
NEUTROS ABS: 2.7 10*3/uL (ref 1.7–7.7)
Neutrophils Relative %: 57 %
Platelet Count: 167 10*3/uL (ref 150–400)
RBC: 3.54 MIL/uL — ABNORMAL LOW (ref 4.22–5.81)
RDW: 12.6 % (ref 11.5–15.5)
WBC Count: 4.9 10*3/uL (ref 4.0–10.5)
nRBC: 0 % (ref 0.0–0.2)

## 2018-09-22 LAB — CMP (CANCER CENTER ONLY)
ALT: 12 U/L (ref 0–44)
AST: 16 U/L (ref 15–41)
Albumin: 4 g/dL (ref 3.5–5.0)
Alkaline Phosphatase: 66 U/L (ref 38–126)
Anion gap: 7 (ref 5–15)
BUN: 14 mg/dL (ref 8–23)
CO2: 26 mmol/L (ref 22–32)
Calcium: 9.3 mg/dL (ref 8.9–10.3)
Chloride: 108 mmol/L (ref 98–111)
Creatinine: 0.93 mg/dL (ref 0.61–1.24)
GFR, Est AFR Am: >60 mL/min (ref >60)
GFR, Estimated: >60 mL/min (ref >60)
Glucose, Bld: 85 mg/dL (ref 70–99)
POTASSIUM: 4.4 mmol/L (ref 3.5–5.1)
Sodium: 141 mmol/L (ref 135–145)
TOTAL PROTEIN: 7.8 g/dL (ref 6.5–8.1)
Total Bilirubin: 0.4 mg/dL (ref 0.3–1.2)

## 2018-09-22 LAB — TSH: TSH: 45.466 u[IU]/mL — ABNORMAL HIGH (ref 0.320–4.118)

## 2018-09-22 MED ORDER — SODIUM CHLORIDE 0.9 % IV SOLN
Freq: Once | INTRAVENOUS | Status: AC
  Administered 2018-09-22: 16:00:00 via INTRAVENOUS
  Filled 2018-09-22: qty 250

## 2018-09-22 MED ORDER — SODIUM CHLORIDE 0.9% FLUSH
10.0000 mL | INTRAVENOUS | Status: DC | PRN
  Administered 2018-09-22: 10 mL
  Filled 2018-09-22: qty 10

## 2018-09-22 MED ORDER — SODIUM CHLORIDE 0.9 % IV SOLN
200.0000 mg | Freq: Once | INTRAVENOUS | Status: AC
  Administered 2018-09-22: 200 mg via INTRAVENOUS
  Filled 2018-09-22: qty 8

## 2018-09-22 MED ORDER — HEPARIN SOD (PORK) LOCK FLUSH 100 UNIT/ML IV SOLN
500.0000 [IU] | Freq: Once | INTRAVENOUS | Status: AC | PRN
  Administered 2018-09-22: 500 [IU]
  Filled 2018-09-22: qty 5

## 2018-09-22 MED ORDER — SODIUM CHLORIDE 0.9% FLUSH
10.0000 mL | Freq: Once | INTRAVENOUS | Status: AC
  Administered 2018-09-22: 10 mL
  Filled 2018-09-22: qty 10

## 2018-09-22 NOTE — Progress Notes (Signed)
[name] [phone]   [phone]  OFFICE PROGRESS NOTE  Travis Maize, MD [place] [address]  DIAGNOSIS: Stage IV (T3, N0, M1c) non-small cell lung cancer, squamous cell carcinoma presented with large right infrahilar mass in addition to left upper lobe lung nodule as well as left axillary mass with left axillary lymph node diagnosed in August 2019.  PRIOR THERAPY:  1) Palliative radiotherapy to the right infrahilar mass as well as the axillary mass under the care of Dr. Lisbeth Renshaw. 2) Systemic chemotherapy with carboplatin for AUC of 5, paclitaxel 175 mg/M2 and Keytruda 200 mg IV every 3 weeks status post 4 cycles.  CURRENT THERAPY: Maintenance immunotherapy with single agent Keytruda 200 mg IV every 3 weeks status post 3 cycles.  INTERVAL HISTORY: Travis Padilla 73 y.o. male returns to the clinic today for follow-up visit accompanied by his wife.  The patient is feeling fine today with no concerning complaints except for fatigue.  He is recovering from recent flulike symptoms.  He denied having any current chest pain, shortness breath, cough or hemoptysis.  He denied having any fever or chills.  He has no nausea, vomiting, diarrhea or constipation.  He denied having any headache or visual changes.  He had repeat CT scan of the chest, abdomen and pelvis performed recently and is here for evaluation and discussion of his scan results.   MEDICAL HISTORY: Past Medical History:  Diagnosis Date  . Abnormal nuclear stress test    December, 2013  . Anemia   . CAD (coronary artery disease)    Mild nonobstructive plaque in cath 2013  . Chest pain    December, 2013  . Dizziness   . Gout   . Hemorrhoid   . Hyperlipidemia   . Hypertension   . Skin cancer   . Spinal stenosis     ALLERGIES:  is allergic to trazodone and nefazodone.  MEDICATIONS:  Current Outpatient Medications  Medication Sig Dispense Refill  . albuterol  (PROVENTIL) (2.5 MG/3ML) 0.083% nebulizer solution Take 3 mLs (2.5 mg total) by nebulization every 6 (six) hours as needed for wheezing or shortness of breath. (Patient not taking: Reported on 08/11/2018) 75 mL 12  . ALPRAZolam (XANAX) 0.25 MG tablet Take 1 tablet (0.25 mg total) by mouth at bedtime as needed for anxiety. (Patient not taking: Reported on 09/01/2018) 30 tablet 0  . Artificial Tear Solution (SOOTHE XP OP) Place 1 drop into both eyes 2 (two) times daily.    . carvedilol (COREG) 12.5 MG tablet TAKE 1 AND 1/2 TABLETS BY MOUTH TWO TIMES DAILY WITH A MEAL (Patient taking differently: Take 18.75 mg by mouth 2 (two) times daily with a meal. ) 270 tablet 3  . diphenhydramine-acetaminophen (TYLENOL PM) 25-500 MG TABS tablet Take 1-2 tablets by mouth at bedtime as needed (pain).    Marland Kitchen gabapentin (NEURONTIN) 100 MG capsule Take 1 capsule (100 mg total) by mouth 3 (three) times daily. 90 capsule 1  . Glucosamine-Chondroitin (OSTEO BI-FLEX REGULAR STRENGTH PO) Take 1 tablet by mouth daily.    . hydrocortisone (ANUSOL-HC) 2.5 % rectal cream Place 1 application rectally 4 (four) times daily. (Patient taking differently: Place 1 application rectally 4 (four) times daily as needed for hemorrhoids. ) 30 g 3  . levothyroxine (SYNTHROID, LEVOTHROID) 88 MCG tablet Take 1 tablet (88 mcg total) by mouth daily before breakfast. 30 tablet 1  . lidocaine-prilocaine (EMLA) cream Apply 1 application  topically as needed. (Patient taking differently: Apply 1 application topically daily as needed (port). ) 30 g 0  . mirtazapine (REMERON) 15 MG tablet Take 1 tablet (15 mg total) by mouth at bedtime. (Patient taking differently: Take 15 mg by mouth at bedtime as needed (sleep). ) 30 tablet 5  . Multiple Vitamin (MULTIVITAMIN) tablet Take 1 tablet by mouth daily.    . Multiple Vitamins-Minerals (PRESERVISION AREDS 2 PO) Take by mouth.    . oxyCODONE-acetaminophen (PERCOCET/ROXICET) 5-325 MG tablet Take 1 tablet by mouth  every 8 (eight) hours as needed for severe pain. (Patient not taking: Reported on 09/01/2018) 30 tablet 0  . pravastatin (PRAVACHOL) 40 MG tablet TAKE 1 TABLET BY MOUTH EVERY DAY (Patient taking differently: Take 40 mg by mouth at bedtime. ) 90 tablet 3  . prochlorperazine (COMPAZINE) 10 MG tablet Take 1 tablet (10 mg total) by mouth every 6 (six) hours as needed for nausea or vomiting. (Patient not taking: Reported on 09/01/2018) 30 tablet 1  . senna-docusate (SENNA S) 8.6-50 MG tablet 1 to 2 twice daily for constipation (Patient taking differently: Take 1 tablet by mouth 2 (two) times daily. ) 120 tablet 5  . Wound Dressings (SONAFINE EX) Apply 1 application topically daily.     No current facility-administered medications for this visit.     SURGICAL HISTORY:  Past Surgical History:  Procedure Laterality Date  . 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 BELPHAROPTOSIS REPAIR Bilateral   . CATARACT EXTRACTION W/ INTRAOCULAR LENS  IMPLANT, BILATERAL    . COLONOSCOPY N/A 11/03/2014   Procedure: COLONOSCOPY;  Surgeon: Rogene Houston, MD;  Location: [facility];  Service: Endoscopy;  Laterality: N/A;  1225  . IR IMAGING GUIDED PORT INSERTION  07/11/2018  . KNEE CARTILAGE SURGERY Left    Left knee  . SKIN CANCER EXCISION  12/2014, 04/25/15  . VIDEO BRONCHOSCOPY Bilateral 05/09/2018   Procedure: VIDEO BRONCHOSCOPY WITH FLUORO;  Surgeon: Juanito Doom, MD;  Location: [facility];  Service: Cardiopulmonary;  Laterality: Bilateral;    REVIEW OF SYSTEMS:  Constitutional: positive for fatigue Eyes: negative Ears, nose, mouth, throat, and face: negative Respiratory: negative Cardiovascular: negative Gastrointestinal: negative Genitourinary:negative Integument/breast: negative Hematologic/lymphatic: negative Musculoskeletal:negative Neurological: negative Behavioral/Psych: negative Endocrine: negative Allergic/Immunologic: negative   PHYSICAL EXAMINATION: General appearance: alert,  cooperative, fatigued and no distress Head: Normocephalic, without obvious abnormality, atraumatic Neck: no adenopathy, no JVD, supple, symmetrical, trachea midline and thyroid not enlarged, symmetric, no tenderness/mass/nodules Lymph nodes: Cervical, supraclavicular, and axillary nodes normal. Resp: clear to auscultation bilaterally Back: symmetric, no curvature. ROM normal. No CVA tenderness. Cardio: regular rate and rhythm, S1, S2 normal, no murmur, click, rub or gallop GI: soft, non-tender; bowel sounds normal; no masses,  no organomegaly Extremities: extremities normal, atraumatic, no cyanosis or edema Neurologic: Alert and oriented X 3, normal strength and tone. Normal symmetric reflexes. Normal coordination and gait  ECOG PERFORMANCE STATUS: 1 - Symptomatic but completely ambulatory  Blood pressure 118/74, pulse 70, temperature 98.3 F (36.8 C), temperature source Oral, resp. rate 18, height 6\' 1"  (1.854 m), weight 239 lb (108.4 kg), SpO2 98 %.  LABORATORY DATA: Lab Results  Component Value Date   WBC 4.9 09/22/2018   HGB 11.4 (L) 09/22/2018   HCT 34.4 (L) 09/22/2018   MCV 97.2 09/22/2018   PLT 167 09/22/2018      Chemistry      Component Value Date/Time   NA 141 09/22/2018 1307   NA 144 04/09/2018  1507   K 4.4 09/22/2018 1307   CL 108 09/22/2018 1307   CO2 26 09/22/2018 1307   BUN 14 09/22/2018 1307   BUN 15 04/09/2018 1507   CREATININE 0.93 09/22/2018 1307      Component Value Date/Time   CALCIUM 9.3 09/22/2018 1307   ALKPHOS 66 09/22/2018 1307   AST 16 09/22/2018 1307   ALT 12 09/22/2018 1307   BILITOT 0.4 09/22/2018 1307       RADIOGRAPHIC STUDIES: Ct Chest W Contrast  Result Date: 09/18/2018 CLINICAL DATA:  Stage IV non-small cell lung cancer. Status post palliative radiotherapy to right infrahilar mass and right axillary mass. EXAM: CT CHEST, ABDOMEN, AND PELVIS WITH CONTRAST TECHNIQUE: Multidetector CT imaging of the chest, abdomen and pelvis was  performed following the standard protocol during bolus administration of intravenous contrast. CONTRAST:  135mL OMNIPAQUE IOHEXOL 300 MG/ML  SOLN COMPARISON:  07/14/2018 FINDINGS: CT CHEST FINDINGS Cardiovascular: The heart size is normal. No substantial pericardial effusion. Coronary artery calcification is evident. Atherosclerotic calcification is noted in the wall of the thoracic aorta. Right Port-A-Cath tip is in the distal SVC near the RA. Mediastinum/Nodes: No mediastinal lymphadenopathy. No left hilar lymphadenopathy. Post treatment changes in the right hilum are similar to prior. Retro hilar nodular component measured previously at 2.2 x 2.9 cm measures similar today at 2.3 x 2.9 cm (32/2). The esophagus has normal imaging features. There is no right axillary lymphadenopathy. Irregular left axillary node measures 1.9 x 2.0 cm today compared to 1.5 x 2.2 cm previously, substantially changed. Lungs/Pleura: The central tracheobronchial airways are patent. Interval progression of right parahilar scarring and airspace opacification compatible with evolving radiation fibrosis. Left upper lobe pulmonary nodule is stable at 1.9 cm. Musculoskeletal: No worrisome lytic or sclerotic osseous abnormality. CT ABDOMEN PELVIS FINDINGS Hepatobiliary: The small left hepatic cyst is stable. Liver otherwise unremarkable. There is no evidence for gallstones, gallbladder wall thickening, or pericholecystic fluid. No intrahepatic or extrahepatic biliary dilation. Pancreas: No focal mass lesion. No dilatation of the main duct. No intraparenchymal cyst. No peripancreatic edema. Spleen: No splenomegaly. No focal mass lesion. Adrenals/Urinary Tract: No adrenal nodule or mass. 3 nonobstructive stones are seen in the right kidney, 1 each in the upper pole, interpolar region, and lower pole and the all measuring in the 3 mm size range. Adjacent 2 mm nonobstructing stones are seen in the upper pole of the left kidney with tiny stones  visible in the interpolar left kidney. No suspicious parenchymal abnormality in either kidney although a tiny cyst in the upper pole the left kidney is stable. No evidence for hydroureter. The urinary bladder appears normal for the degree of distention. Stomach/Bowel: Stomach is nondistended. No gastric wall thickening. No evidence of outlet obstruction. Duodenum is normally positioned as is the ligament of Treitz. No small bowel wall thickening. No small bowel dilatation. The terminal ileum is normal. The appendix is normal. No gross colonic mass. No colonic wall thickening. Vascular/Lymphatic: There is abdominal aortic atherosclerosis without aneurysm. Portal vein and superior mesenteric vein are patent. There is no gastrohepatic or hepatoduodenal ligament lymphadenopathy. No intraperitoneal or retroperitoneal lymphadenopathy. No pelvic sidewall lymphadenopathy. Reproductive: Prostate gland appears mildly enlarged. Other: No intraperitoneal free fluid. Musculoskeletal: No worrisome lytic or sclerotic osseous abnormality. IMPRESSION: 1. No new or progressive findings on today's study. 2. Similar appearance of the right retro hilar nodule with interval progression radiation fibrosis in the parahilar right lung. 3. Stable left upper lobe pulmonary nodule. 4. Stable appearance treated lymphadenopathy  in the left axilla. 5. No evidence for metastatic disease in the abdomen or pelvis. 6. Bilateral nonobstructing renal stones. Electronically Signed   By: Misty Stanley M.D.   On: 09/18/2018 13:12   Ct Abdomen Pelvis W Contrast  Result Date: 09/18/2018 CLINICAL DATA:  Stage IV non-small cell lung cancer. Status post palliative radiotherapy to right infrahilar mass and right axillary mass. EXAM: CT CHEST, ABDOMEN, AND PELVIS WITH CONTRAST TECHNIQUE: Multidetector CT imaging of the chest, abdomen and pelvis was performed following the standard protocol during bolus administration of intravenous contrast. CONTRAST:  176mL  OMNIPAQUE IOHEXOL 300 MG/ML  SOLN COMPARISON:  07/14/2018 FINDINGS: CT CHEST FINDINGS Cardiovascular: The heart size is normal. No substantial pericardial effusion. Coronary artery calcification is evident. Atherosclerotic calcification is noted in the wall of the thoracic aorta. Right Port-A-Cath tip is in the distal SVC near the RA. Mediastinum/Nodes: No mediastinal lymphadenopathy. No left hilar lymphadenopathy. Post treatment changes in the right hilum are similar to prior. Retro hilar nodular component measured previously at 2.2 x 2.9 cm measures similar today at 2.3 x 2.9 cm (32/2). The esophagus has normal imaging features. There is no right axillary lymphadenopathy. Irregular left axillary node measures 1.9 x 2.0 cm today compared to 1.5 x 2.2 cm previously, substantially changed. Lungs/Pleura: The central tracheobronchial airways are patent. Interval progression of right parahilar scarring and airspace opacification compatible with evolving radiation fibrosis. Left upper lobe pulmonary nodule is stable at 1.9 cm. Musculoskeletal: No worrisome lytic or sclerotic osseous abnormality. CT ABDOMEN PELVIS FINDINGS Hepatobiliary: The small left hepatic cyst is stable. Liver otherwise unremarkable. There is no evidence for gallstones, gallbladder wall thickening, or pericholecystic fluid. No intrahepatic or extrahepatic biliary dilation. Pancreas: No focal mass lesion. No dilatation of the main duct. No intraparenchymal cyst. No peripancreatic edema. Spleen: No splenomegaly. No focal mass lesion. Adrenals/Urinary Tract: No adrenal nodule or mass. 3 nonobstructive stones are seen in the right kidney, 1 each in the upper pole, interpolar region, and lower pole and the all measuring in the 3 mm size range. Adjacent 2 mm nonobstructing stones are seen in the upper pole of the left kidney with tiny stones visible in the interpolar left kidney. No suspicious parenchymal abnormality in either kidney although a tiny cyst  in the upper pole the left kidney is stable. No evidence for hydroureter. The urinary bladder appears normal for the degree of distention. Stomach/Bowel: Stomach is nondistended. No gastric wall thickening. No evidence of outlet obstruction. Duodenum is normally positioned as is the ligament of Treitz. No small bowel wall thickening. No small bowel dilatation. The terminal ileum is normal. The appendix is normal. No gross colonic mass. No colonic wall thickening. Vascular/Lymphatic: There is abdominal aortic atherosclerosis without aneurysm. Portal vein and superior mesenteric vein are patent. There is no gastrohepatic or hepatoduodenal ligament lymphadenopathy. No intraperitoneal or retroperitoneal lymphadenopathy. No pelvic sidewall lymphadenopathy. Reproductive: Prostate gland appears mildly enlarged. Other: No intraperitoneal free fluid. Musculoskeletal: No worrisome lytic or sclerotic osseous abnormality. IMPRESSION: 1. No new or progressive findings on today's study. 2. Similar appearance of the right retro hilar nodule with interval progression radiation fibrosis in the parahilar right lung. 3. Stable left upper lobe pulmonary nodule. 4. Stable appearance treated lymphadenopathy in the left axilla. 5. No evidence for metastatic disease in the abdomen or pelvis. 6. Bilateral nonobstructing renal stones. Electronically Signed   By: Misty Stanley M.D.   On: 09/18/2018 13:12    ASSESSMENT AND PLAN: This is a very pleasant 72  years old white male with very light most smoking history recently diagnosed with stage IV (T3, N0, M1c) non-small cell lung cancer, squamous cell carcinoma based on the biopsy from the left axillary mass. He status post 4 cycles of induction systemic chemotherapy with carboplatin, paclitaxel and Keytruda with partial response.  The patient is currently on maintenance treatment with single agent Keytruda status post 4 cycles. The patient continues to tolerate this treatment well with no  concerning adverse effects. He had repeat CT scan of the chest, abdomen and pelvis performed recently.  I personally and independently reviewed the scan and discussed the results with the patient and his wife today. His a scan showed no concerning findings for disease progression. I recommended for the patient to continue his current treatment with immunotherapy with [facility] and he will proceed with cycle #4 today. I will see him back for follow-up visit in 3 weeks for evaluation before starting cycle #5 of this treatment. The patient was advised to call immediately if he has any concerning symptoms in the interval. For the peripheral neuropathy, I will start the patient on gabapentin 100 mg p.o. 3 times daily. For the hypothyroidism, we will continue with levothyroxine and I will adjust the dose as needed based on his lab work. He was advised to call immediately if he has any concerning symptoms in the interval. The patient voices understanding of current disease status and treatment options and is in agreement with the current care plan. All questions were answered. The patient knows to call the clinic with any problems, questions or concerns. We can certainly see the patient much sooner if necessary.   Disclaimer: This note was dictated with voice recognition software. Similar sounding words can inadvertently be transcribed and may not be corrected upon review.

## 2018-09-22 NOTE — Patient Instructions (Signed)
[facility] Discharge Instructions for Patients Receiving Chemotherapy  Today you received the following chemotherapy agents Pembrolizumab [facility]).  To help prevent nausea and vomiting after your treatment, we encourage you to take your nausea medication as prescribed.   If you develop nausea and vomiting that is not controlled by your nausea medication, call the clinic.   BELOW ARE SYMPTOMS THAT SHOULD BE REPORTED IMMEDIATELY:  *FEVER GREATER THAN 100.5 F  *CHILLS WITH OR WITHOUT FEVER  NAUSEA AND VOMITING THAT IS NOT CONTROLLED WITH YOUR NAUSEA MEDICATION  *UNUSUAL SHORTNESS OF BREATH  *UNUSUAL BRUISING OR BLEEDING  TENDERNESS IN MOUTH AND THROAT WITH OR WITHOUT PRESENCE OF ULCERS  *URINARY PROBLEMS  *BOWEL PROBLEMS  UNUSUAL RASH Items with * indicate a potential emergency and should be followed up as soon as possible.  Feel free to call the clinic should you have any questions or concerns. The clinic phone number is [phone].  Please show the [place] at check-in to the Emergency Department and triage nurse.

## 2018-09-24 ENCOUNTER — Telehealth: Payer: Self-pay | Admitting: Internal Medicine

## 2018-09-24 NOTE — Telephone Encounter (Signed)
Scheduled appt per 1/13 los - pt to get an updated schedule next visit.

## 2018-09-26 ENCOUNTER — Other Ambulatory Visit: Payer: Self-pay | Admitting: Internal Medicine

## 2018-10-14 ENCOUNTER — Inpatient Hospital Stay (HOSPITAL_BASED_OUTPATIENT_CLINIC_OR_DEPARTMENT_OTHER): Payer: Medicare Other | Admitting: Internal Medicine

## 2018-10-14 ENCOUNTER — Inpatient Hospital Stay: Payer: Medicare Other | Attending: Oncology

## 2018-10-14 ENCOUNTER — Encounter: Payer: Self-pay | Admitting: Internal Medicine

## 2018-10-14 ENCOUNTER — Inpatient Hospital Stay: Payer: Medicare Other

## 2018-10-14 VITALS — BP 101/77 | HR 75 | Temp 98.6°F | Resp 18 | Ht 73.0 in | Wt 239.5 lb

## 2018-10-14 DIAGNOSIS — I10 Essential (primary) hypertension: Secondary | ICD-10-CM | POA: Insufficient documentation

## 2018-10-14 DIAGNOSIS — Z5111 Encounter for antineoplastic chemotherapy: Secondary | ICD-10-CM

## 2018-10-14 DIAGNOSIS — C3412 Malignant neoplasm of upper lobe, left bronchus or lung: Secondary | ICD-10-CM | POA: Insufficient documentation

## 2018-10-14 DIAGNOSIS — Z5112 Encounter for antineoplastic immunotherapy: Secondary | ICD-10-CM

## 2018-10-14 DIAGNOSIS — Z79899 Other long term (current) drug therapy: Secondary | ICD-10-CM | POA: Diagnosis not present

## 2018-10-14 DIAGNOSIS — R5383 Other fatigue: Secondary | ICD-10-CM

## 2018-10-14 DIAGNOSIS — Z87891 Personal history of nicotine dependence: Secondary | ICD-10-CM | POA: Diagnosis not present

## 2018-10-14 DIAGNOSIS — K5900 Constipation, unspecified: Secondary | ICD-10-CM | POA: Insufficient documentation

## 2018-10-14 DIAGNOSIS — Z923 Personal history of irradiation: Secondary | ICD-10-CM | POA: Diagnosis not present

## 2018-10-14 DIAGNOSIS — E039 Hypothyroidism, unspecified: Secondary | ICD-10-CM | POA: Insufficient documentation

## 2018-10-14 DIAGNOSIS — Z85828 Personal history of other malignant neoplasm of skin: Secondary | ICD-10-CM | POA: Diagnosis not present

## 2018-10-14 DIAGNOSIS — C7801 Secondary malignant neoplasm of right lung: Secondary | ICD-10-CM

## 2018-10-14 DIAGNOSIS — C3491 Malignant neoplasm of unspecified part of right bronchus or lung: Secondary | ICD-10-CM

## 2018-10-14 DIAGNOSIS — Z95828 Presence of other vascular implants and grafts: Secondary | ICD-10-CM

## 2018-10-14 DIAGNOSIS — E032 Hypothyroidism due to medicaments and other exogenous substances: Secondary | ICD-10-CM

## 2018-10-14 LAB — CMP (CANCER CENTER ONLY)
ALT: 11 U/L (ref 0–44)
AST: 16 U/L (ref 15–41)
Albumin: 3.8 g/dL (ref 3.5–5.0)
Alkaline Phosphatase: 61 U/L (ref 38–126)
Anion gap: 8 (ref 5–15)
BUN: 18 mg/dL (ref 8–23)
CO2: 26 mmol/L (ref 22–32)
Calcium: 9 mg/dL (ref 8.9–10.3)
Chloride: 108 mmol/L (ref 98–111)
Creatinine: 0.96 mg/dL (ref 0.61–1.24)
GFR, Est AFR Am: >60 mL/min (ref >60)
GFR, Estimated: >60 mL/min (ref >60)
Glucose, Bld: 98 mg/dL (ref 70–99)
Potassium: 4.3 mmol/L (ref 3.5–5.1)
SODIUM: 142 mmol/L (ref 135–145)
Total Bilirubin: 0.5 mg/dL (ref 0.3–1.2)
Total Protein: 7.4 g/dL (ref 6.5–8.1)

## 2018-10-14 LAB — CBC WITH DIFFERENTIAL (CANCER CENTER ONLY)
ABS IMMATURE GRANULOCYTES: 0.01 10*3/uL (ref 0.00–0.07)
Basophils Absolute: 0 10*3/uL (ref 0.0–0.1)
Basophils Relative: 0 %
Eosinophils Absolute: 0.3 10*3/uL (ref 0.0–0.5)
Eosinophils Relative: 7 %
HCT: 35 % — ABNORMAL LOW (ref 39.0–52.0)
Hemoglobin: 11.7 g/dL — ABNORMAL LOW (ref 13.0–17.0)
IMMATURE GRANULOCYTES: 0 %
Lymphocytes Relative: 17 %
Lymphs Abs: 0.7 10*3/uL (ref 0.7–4.0)
MCH: 31.7 pg (ref 26.0–34.0)
MCHC: 33.4 g/dL (ref 30.0–36.0)
MCV: 94.9 fL (ref 80.0–100.0)
Monocytes Absolute: 0.8 10*3/uL (ref 0.1–1.0)
Monocytes Relative: 19 %
Neutro Abs: 2.5 10*3/uL (ref 1.7–7.7)
Neutrophils Relative %: 57 %
Platelet Count: 155 10*3/uL (ref 150–400)
RBC: 3.69 MIL/uL — ABNORMAL LOW (ref 4.22–5.81)
RDW: 12 % (ref 11.5–15.5)
WBC Count: 4.4 10*3/uL (ref 4.0–10.5)
nRBC: 0 % (ref 0.0–0.2)

## 2018-10-14 LAB — TSH: TSH: 22.986 u[IU]/mL — ABNORMAL HIGH (ref 0.320–4.118)

## 2018-10-14 MED ORDER — SODIUM CHLORIDE 0.9 % IV SOLN
200.0000 mg | Freq: Once | INTRAVENOUS | Status: AC
  Administered 2018-10-14: 200 mg via INTRAVENOUS
  Filled 2018-10-14: qty 8

## 2018-10-14 MED ORDER — SODIUM CHLORIDE 0.9% FLUSH
10.0000 mL | Freq: Once | INTRAVENOUS | Status: AC
  Administered 2018-10-14: 10 mL
  Filled 2018-10-14: qty 10

## 2018-10-14 MED ORDER — SODIUM CHLORIDE 0.9% FLUSH
10.0000 mL | INTRAVENOUS | Status: DC | PRN
  Administered 2018-10-14: 10 mL
  Filled 2018-10-14: qty 10

## 2018-10-14 MED ORDER — HEPARIN SOD (PORK) LOCK FLUSH 100 UNIT/ML IV SOLN
500.0000 [IU] | Freq: Once | INTRAVENOUS | Status: AC | PRN
  Administered 2018-10-14: 500 [IU]
  Filled 2018-10-14: qty 5

## 2018-10-14 MED ORDER — SODIUM CHLORIDE 0.9 % IV SOLN
Freq: Once | INTRAVENOUS | Status: AC
  Administered 2018-10-14: 12:00:00 via INTRAVENOUS
  Filled 2018-10-14: qty 250

## 2018-10-14 NOTE — Patient Instructions (Signed)
[facility] Discharge Instructions for Patients Receiving Chemotherapy  Today you received the following chemotherapy agent: Pembrolizumab [facility]).  To help prevent nausea and vomiting after your treatment, we encourage you to take your nausea medication as prescribed.   If you develop nausea and vomiting that is not controlled by your nausea medication, call the clinic.   BELOW ARE SYMPTOMS THAT SHOULD BE REPORTED IMMEDIATELY:  *FEVER GREATER THAN 100.5 F  *CHILLS WITH OR WITHOUT FEVER  NAUSEA AND VOMITING THAT IS NOT CONTROLLED WITH YOUR NAUSEA MEDICATION  *UNUSUAL SHORTNESS OF BREATH  *UNUSUAL BRUISING OR BLEEDING  TENDERNESS IN MOUTH AND THROAT WITH OR WITHOUT PRESENCE OF ULCERS  *URINARY PROBLEMS  *BOWEL PROBLEMS  UNUSUAL RASH Items with * indicate a potential emergency and should be followed up as soon as possible.  Feel free to call the clinic should you have any questions or concerns. The clinic phone number is [phone].  Please show the [place] at check-in to the Emergency Department and triage nurse.

## 2018-10-14 NOTE — Progress Notes (Signed)
[name]   [phone]  OFFICE PROGRESS NOTE  Travis Padilla, [place] [address]  DIAGNOSIS: Stage IV (T3, N0, M1c) non-small cell lung cancer, squamous cell carcinoma presented with large right infrahilar mass in addition to left upper lobe lung nodule as well as left axillary mass with left axillary lymph node diagnosed in August 2019.  PRIOR THERAPY:  1) Palliative radiotherapy to the right infrahilar mass as well as the axillary mass under the care of Dr. Lisbeth Renshaw. 2) Systemic chemotherapy with carboplatin for AUC of 5, paclitaxel 175 mg/M2 and Keytruda 200 mg IV every 3 weeks status post 4 cycles.  CURRENT THERAPY: Maintenance immunotherapy with single agent Keytruda 200 mg IV every 3 weeks status post 4 cycles.  INTERVAL HISTORY: Travis Padilla 73 y.o. male returns to the clinic today for follow-up visit accompanied by his wife.  The patient is feeling fine today with no concerning complaints except for mild fatigue and shortness of breath with exertion.  He continues to tolerate his maintenance treatment with Keytruda fairly well.  He denied having any skin rash or diarrhea.  He has no chest pain, cough or hemoptysis.  He denied having any headache or visual changes.  The patient has no nausea, vomiting, abdominal pain or constipation.  He is here today for evaluation before starting cycle #5.  MEDICAL HISTORY: Past Medical History:  Diagnosis Date  . Abnormal nuclear stress test    December, 2013  . Anemia   . CAD (coronary artery disease)    Mild nonobstructive plaque in cath 2013  . Chest pain    December, 2013  . Dizziness   . Gout   . Hemorrhoid   . Hyperlipidemia   . Hypertension   . Skin cancer   . Spinal stenosis     ALLERGIES:  is allergic to trazodone and nefazodone.  MEDICATIONS:  Current Outpatient Medications  Medication Sig Dispense Refill  . ALPRAZolam (XANAX) 0.25 MG tablet Take  1 tablet (0.25 mg total) by mouth at bedtime as needed for anxiety. 30 tablet 0  . Artificial Tear Solution (SOOTHE XP OP) Place 1 drop into both eyes 2 (two) times daily.    . carvedilol (COREG) 12.5 MG tablet TAKE 1 AND 1/2 TABLETS BY MOUTH TWO TIMES DAILY WITH A MEAL (Patient taking differently: Take 18.75 mg by mouth 2 (two) times daily with a meal. ) 270 tablet 3  . diphenhydramine-acetaminophen (TYLENOL PM) 25-500 MG TABS tablet Take 1-2 tablets by mouth at bedtime as needed (pain).    Marland Kitchen gabapentin (NEURONTIN) 100 MG capsule TAKE 1 CAPSULE BY MOUTH THREE TIMES A DAY 270 capsule 1  . Glucosamine-Chondroitin (OSTEO BI-FLEX REGULAR STRENGTH PO) Take 1 tablet by mouth daily.    . hydrocortisone (ANUSOL-HC) 2.5 % rectal cream Place 1 application rectally 4 (four) times daily. (Patient taking differently: Place 1 application rectally 4 (four) times daily as needed for hemorrhoids. ) 30 g 3  . levothyroxine (SYNTHROID, LEVOTHROID) 88 MCG tablet TAKE 1 TABLET BY MOUTH EVERY DAY BEFORE BREAKFAST 30 tablet 1  . lidocaine-prilocaine (EMLA) cream Apply 1 application topically as needed. (Patient taking differently: Apply 1 application topically daily as needed (port). ) 30 g 0  . mirtazapine (REMERON) 15 MG tablet Take 1 tablet (15 mg total) by mouth at bedtime. (Patient taking differently: Take 15 mg by mouth at bedtime as needed (sleep). ) 30 tablet 5  .  Multiple Vitamin (MULTIVITAMIN) tablet Take 1 tablet by mouth daily.    . Multiple Vitamins-Minerals (PRESERVISION AREDS 2 PO) Take by mouth.    . pravastatin (PRAVACHOL) 40 MG tablet TAKE 1 TABLET BY MOUTH EVERY DAY (Patient taking differently: Take 40 mg by mouth at bedtime. ) 90 tablet 3  . senna-docusate (SENNA S) 8.6-50 MG tablet 1 to 2 twice daily for constipation (Patient taking differently: Take 1 tablet by mouth 2 (two) times daily. ) 120 tablet 5  . albuterol (PROVENTIL) (2.5 MG/3ML) 0.083% nebulizer solution Take 3 mLs (2.5 mg total) by  nebulization every 6 (six) hours as needed for wheezing or shortness of breath. (Patient not taking: Reported on 08/11/2018) 75 mL 12  . oxyCODONE-acetaminophen (PERCOCET/ROXICET) 5-325 MG tablet Take 1 tablet by mouth every 8 (eight) hours as needed for severe pain. (Patient not taking: Reported on 09/01/2018) 30 tablet 0  . prochlorperazine (COMPAZINE) 10 MG tablet Take 1 tablet (10 mg total) by mouth every 6 (six) hours as needed for nausea or vomiting. (Patient not taking: Reported on 09/01/2018) 30 tablet 1   No current facility-administered medications for this visit.     SURGICAL HISTORY:  Past Surgical History:  Procedure Laterality Date  . 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 BELPHAROPTOSIS REPAIR Bilateral   . CATARACT EXTRACTION W/ INTRAOCULAR LENS  IMPLANT, BILATERAL    . COLONOSCOPY N/A 11/03/2014   Procedure: COLONOSCOPY;  Surgeon: Rogene Houston, MD;  Location: [facility];  Service: Endoscopy;  Laterality: N/A;  1225  . IR IMAGING GUIDED PORT INSERTION  07/11/2018  . KNEE CARTILAGE SURGERY Left    Left knee  . SKIN CANCER EXCISION  12/2014, 04/25/15  . VIDEO BRONCHOSCOPY Bilateral 05/09/2018   Procedure: VIDEO BRONCHOSCOPY WITH FLUORO;  Surgeon: Juanito Doom, MD;  Location: [facility];  Service: Cardiopulmonary;  Laterality: Bilateral;    REVIEW OF SYSTEMS:  Constitutional: positive for fatigue Eyes: negative Ears, nose, mouth, throat, and face: negative Respiratory: negative Cardiovascular: negative Gastrointestinal: negative Genitourinary:negative Integument/breast: negative Hematologic/lymphatic: negative Musculoskeletal:negative Neurological: negative Behavioral/Psych: negative Endocrine: negative Allergic/Immunologic: negative   PHYSICAL EXAMINATION: General appearance: alert, cooperative, fatigued and no distress Head: Normocephalic, without obvious abnormality, atraumatic Neck: no adenopathy, no JVD, supple, symmetrical, trachea midline and thyroid  not enlarged, symmetric, no tenderness/mass/nodules Lymph nodes: Cervical, supraclavicular, and axillary nodes normal. Resp: clear to auscultation bilaterally Back: symmetric, no curvature. ROM normal. No CVA tenderness. Cardio: regular rate and rhythm, S1, S2 normal, no murmur, click, rub or gallop GI: soft, non-tender; bowel sounds normal; no masses,  no organomegaly Extremities: extremities normal, atraumatic, no cyanosis or edema Neurologic: Alert and oriented X 3, normal strength and tone. Normal symmetric reflexes. Normal coordination and gait  ECOG PERFORMANCE STATUS: 1 - Symptomatic but completely ambulatory  Blood pressure 101/77, pulse 75, temperature 98.6 F (37 C), temperature source Oral, resp. rate 18, height 6\' 1"  (1.854 m), weight 239 lb 8 oz (108.6 kg), SpO2 98 %.  LABORATORY DATA: Lab Results  Component Value Date   WBC 4.4 10/14/2018   HGB 11.7 (L) 10/14/2018   HCT 35.0 (L) 10/14/2018   MCV 94.9 10/14/2018   PLT 155 10/14/2018      Chemistry      Component Value Date/Time   NA 141 09/22/2018 1307   NA 144 04/09/2018 1507   K 4.4 09/22/2018 1307   CL 108 09/22/2018 1307   CO2 26 09/22/2018 1307   BUN 14 09/22/2018 1307  BUN 15 04/09/2018 1507   CREATININE 0.93 09/22/2018 1307      Component Value Date/Time   CALCIUM 9.3 09/22/2018 1307   ALKPHOS 66 09/22/2018 1307   AST 16 09/22/2018 1307   ALT 12 09/22/2018 1307   BILITOT 0.4 09/22/2018 1307       RADIOGRAPHIC STUDIES: Ct Chest W Contrast  Result Date: 09/18/2018 CLINICAL DATA:  Stage IV non-small cell lung cancer. Status post palliative radiotherapy to right infrahilar mass and right axillary mass. EXAM: CT CHEST, ABDOMEN, AND PELVIS WITH CONTRAST TECHNIQUE: Multidetector CT imaging of the chest, abdomen and pelvis was performed following the standard protocol during bolus administration of intravenous contrast. CONTRAST:  115mL OMNIPAQUE IOHEXOL 300 MG/ML  SOLN COMPARISON:  07/14/2018 FINDINGS: CT  CHEST FINDINGS Cardiovascular: The heart size is normal. No substantial pericardial effusion. Coronary artery calcification is evident. Atherosclerotic calcification is noted in the wall of the thoracic aorta. Right Port-A-Cath tip is in the distal SVC near the RA. Mediastinum/Nodes: No mediastinal lymphadenopathy. No left hilar lymphadenopathy. Post treatment changes in the right hilum are similar to prior. Retro hilar nodular component measured previously at 2.2 x 2.9 cm measures similar today at 2.3 x 2.9 cm (32/2). The esophagus has normal imaging features. There is no right axillary lymphadenopathy. Irregular left axillary node measures 1.9 x 2.0 cm today compared to 1.5 x 2.2 cm previously, substantially changed. Lungs/Pleura: The central tracheobronchial airways are patent. Interval progression of right parahilar scarring and airspace opacification compatible with evolving radiation fibrosis. Left upper lobe pulmonary nodule is stable at 1.9 cm. Musculoskeletal: No worrisome lytic or sclerotic osseous abnormality. CT ABDOMEN PELVIS FINDINGS Hepatobiliary: The small left hepatic cyst is stable. Liver otherwise unremarkable. There is no evidence for gallstones, gallbladder wall thickening, or pericholecystic fluid. No intrahepatic or extrahepatic biliary dilation. Pancreas: No focal mass lesion. No dilatation of the main duct. No intraparenchymal cyst. No peripancreatic edema. Spleen: No splenomegaly. No focal mass lesion. Adrenals/Urinary Tract: No adrenal nodule or mass. 3 nonobstructive stones are seen in the right kidney, 1 each in the upper pole, interpolar region, and lower pole and the all measuring in the 3 mm size range. Adjacent 2 mm nonobstructing stones are seen in the upper pole of the left kidney with tiny stones visible in the interpolar left kidney. No suspicious parenchymal abnormality in either kidney although a tiny cyst in the upper pole the left kidney is stable. No evidence for  hydroureter. The urinary bladder appears normal for the degree of distention. Stomach/Bowel: Stomach is nondistended. No gastric wall thickening. No evidence of outlet obstruction. Duodenum is normally positioned as is the ligament of Treitz. No small bowel wall thickening. No small bowel dilatation. The terminal ileum is normal. The appendix is normal. No gross colonic mass. No colonic wall thickening. Vascular/Lymphatic: There is abdominal aortic atherosclerosis without aneurysm. Portal vein and superior mesenteric vein are patent. There is no gastrohepatic or hepatoduodenal ligament lymphadenopathy. No intraperitoneal or retroperitoneal lymphadenopathy. No pelvic sidewall lymphadenopathy. Reproductive: Prostate gland appears mildly enlarged. Other: No intraperitoneal free fluid. Musculoskeletal: No worrisome lytic or sclerotic osseous abnormality. IMPRESSION: 1. No new or progressive findings on today's study. 2. Similar appearance of the right retro hilar nodule with interval progression radiation fibrosis in the parahilar right lung. 3. Stable left upper lobe pulmonary nodule. 4. Stable appearance treated lymphadenopathy in the left axilla. 5. No evidence for metastatic disease in the abdomen or pelvis. 6. Bilateral nonobstructing renal stones. Electronically Signed   By: Randall Hiss  Tery Sanfilippo M.D.   On: 09/18/2018 13:12   Ct Abdomen Pelvis W Contrast  Result Date: 09/18/2018 CLINICAL DATA:  Stage IV non-small cell lung cancer. Status post palliative radiotherapy to right infrahilar mass and right axillary mass. EXAM: CT CHEST, ABDOMEN, AND PELVIS WITH CONTRAST TECHNIQUE: Multidetector CT imaging of the chest, abdomen and pelvis was performed following the standard protocol during bolus administration of intravenous contrast. CONTRAST:  147mL OMNIPAQUE IOHEXOL 300 MG/ML  SOLN COMPARISON:  07/14/2018 FINDINGS: CT CHEST FINDINGS Cardiovascular: The heart size is normal. No substantial pericardial effusion. Coronary  artery calcification is evident. Atherosclerotic calcification is noted in the wall of the thoracic aorta. Right Port-A-Cath tip is in the distal SVC near the RA. Mediastinum/Nodes: No mediastinal lymphadenopathy. No left hilar lymphadenopathy. Post treatment changes in the right hilum are similar to prior. Retro hilar nodular component measured previously at 2.2 x 2.9 cm measures similar today at 2.3 x 2.9 cm (32/2). The esophagus has normal imaging features. There is no right axillary lymphadenopathy. Irregular left axillary node measures 1.9 x 2.0 cm today compared to 1.5 x 2.2 cm previously, substantially changed. Lungs/Pleura: The central tracheobronchial airways are patent. Interval progression of right parahilar scarring and airspace opacification compatible with evolving radiation fibrosis. Left upper lobe pulmonary nodule is stable at 1.9 cm. Musculoskeletal: No worrisome lytic or sclerotic osseous abnormality. CT ABDOMEN PELVIS FINDINGS Hepatobiliary: The small left hepatic cyst is stable. Liver otherwise unremarkable. There is no evidence for gallstones, gallbladder wall thickening, or pericholecystic fluid. No intrahepatic or extrahepatic biliary dilation. Pancreas: No focal mass lesion. No dilatation of the main duct. No intraparenchymal cyst. No peripancreatic edema. Spleen: No splenomegaly. No focal mass lesion. Adrenals/Urinary Tract: No adrenal nodule or mass. 3 nonobstructive stones are seen in the right kidney, 1 each in the upper pole, interpolar region, and lower pole and the all measuring in the 3 mm size range. Adjacent 2 mm nonobstructing stones are seen in the upper pole of the left kidney with tiny stones visible in the interpolar left kidney. No suspicious parenchymal abnormality in either kidney although a tiny cyst in the upper pole the left kidney is stable. No evidence for hydroureter. The urinary bladder appears normal for the degree of distention. Stomach/Bowel: Stomach is  nondistended. No gastric wall thickening. No evidence of outlet obstruction. Duodenum is normally positioned as is the ligament of Treitz. No small bowel wall thickening. No small bowel dilatation. The terminal ileum is normal. The appendix is normal. No gross colonic mass. No colonic wall thickening. Vascular/Lymphatic: There is abdominal aortic atherosclerosis without aneurysm. Portal vein and superior mesenteric vein are patent. There is no gastrohepatic or hepatoduodenal ligament lymphadenopathy. No intraperitoneal or retroperitoneal lymphadenopathy. No pelvic sidewall lymphadenopathy. Reproductive: Prostate gland appears mildly enlarged. Other: No intraperitoneal free fluid. Musculoskeletal: No worrisome lytic or sclerotic osseous abnormality. IMPRESSION: 1. No new or progressive findings on today's study. 2. Similar appearance of the right retro hilar nodule with interval progression radiation fibrosis in the parahilar right lung. 3. Stable left upper lobe pulmonary nodule. 4. Stable appearance treated lymphadenopathy in the left axilla. 5. No evidence for metastatic disease in the abdomen or pelvis. 6. Bilateral nonobstructing renal stones. Electronically Signed   By: Misty Stanley M.D.   On: 09/18/2018 13:12    ASSESSMENT AND PLAN: This is a very pleasant 73 years old white male with very light most smoking history recently diagnosed with stage IV (T3, N0, M1c) non-small cell lung cancer, squamous cell carcinoma based on  the biopsy from the left axillary mass. He status post 4 cycles of induction systemic chemotherapy with carboplatin, paclitaxel and Keytruda with partial response.  The patient is currently on maintenance treatment with single agent Keytruda status post 4 cycles. The patient has been tolerating this treatment well with no concerning adverse effects. I recommended for him to proceed with cycle #5 today of the maintenance therapy as scheduled. The patient has a long list of questions  today and I spent reasonable amount of time answering his question about his prognosis definition of disease progression as well as reviewing some of his previous scan results again. For the peripheral neuropathy, he was started on gabapentin 100 mg p.o. 3 times daily.  He took it for 2 weeks and did not notice any significant improvement so he stopped his medication.  He will try it again for a longer.. For the hypothyroidism, we will continue with levothyroxine and I will adjust the dose as needed based on his lab work. The patient was advised to call immediately if he has any concerning symptoms in the interval. The patient voices understanding of current disease status and treatment options and is in agreement with the current care plan. All questions were answered. The patient knows to call the clinic with any problems, questions or concerns. We can certainly see the patient much sooner if necessary. I spent 15 minutes counseling the patient face to face. The total time spent in the appointment was 25 minutes.  Disclaimer: This note was dictated with voice recognition software. Similar sounding words can inadvertently be transcribed and may not be corrected upon review.

## 2018-10-14 NOTE — Progress Notes (Signed)
Per MD Julien Nordmann ok to proceed with treatment today (2/4) without waiting for TSH results.

## 2018-10-22 ENCOUNTER — Telehealth: Payer: Self-pay | Admitting: Adult Health

## 2018-10-22 NOTE — Telephone Encounter (Signed)
MM PAL 2/25 - moved appointments to [facility]. Left message for patient. Schedule mailed.

## 2018-11-04 ENCOUNTER — Encounter: Payer: Self-pay | Admitting: Adult Health

## 2018-11-04 ENCOUNTER — Inpatient Hospital Stay: Payer: Medicare Other

## 2018-11-04 ENCOUNTER — Inpatient Hospital Stay (HOSPITAL_BASED_OUTPATIENT_CLINIC_OR_DEPARTMENT_OTHER): Payer: Medicare Other | Admitting: Adult Health

## 2018-11-04 VITALS — BP 113/69 | HR 79 | Temp 97.3°F | Resp 18 | Ht 73.0 in | Wt 243.6 lb

## 2018-11-04 DIAGNOSIS — Z79899 Other long term (current) drug therapy: Secondary | ICD-10-CM

## 2018-11-04 DIAGNOSIS — Z5112 Encounter for antineoplastic immunotherapy: Secondary | ICD-10-CM | POA: Diagnosis not present

## 2018-11-04 DIAGNOSIS — Z85828 Personal history of other malignant neoplasm of skin: Secondary | ICD-10-CM

## 2018-11-04 DIAGNOSIS — C3412 Malignant neoplasm of upper lobe, left bronchus or lung: Secondary | ICD-10-CM

## 2018-11-04 DIAGNOSIS — Z923 Personal history of irradiation: Secondary | ICD-10-CM

## 2018-11-04 DIAGNOSIS — K5900 Constipation, unspecified: Secondary | ICD-10-CM | POA: Diagnosis not present

## 2018-11-04 DIAGNOSIS — R5383 Other fatigue: Secondary | ICD-10-CM

## 2018-11-04 DIAGNOSIS — E039 Hypothyroidism, unspecified: Secondary | ICD-10-CM | POA: Diagnosis not present

## 2018-11-04 DIAGNOSIS — Z87891 Personal history of nicotine dependence: Secondary | ICD-10-CM | POA: Diagnosis not present

## 2018-11-04 DIAGNOSIS — C3491 Malignant neoplasm of unspecified part of right bronchus or lung: Secondary | ICD-10-CM

## 2018-11-04 DIAGNOSIS — I10 Essential (primary) hypertension: Secondary | ICD-10-CM

## 2018-11-04 DIAGNOSIS — C7801 Secondary malignant neoplasm of right lung: Secondary | ICD-10-CM

## 2018-11-04 DIAGNOSIS — Z95828 Presence of other vascular implants and grafts: Secondary | ICD-10-CM

## 2018-11-04 DIAGNOSIS — Z5111 Encounter for antineoplastic chemotherapy: Secondary | ICD-10-CM

## 2018-11-04 LAB — CMP (CANCER CENTER ONLY)
ALK PHOS: 57 U/L (ref 38–126)
ALT: 13 U/L (ref 0–44)
AST: 17 U/L (ref 15–41)
Albumin: 4 g/dL (ref 3.5–5.0)
Anion gap: 10 (ref 5–15)
BUN: 16 mg/dL (ref 8–23)
CALCIUM: 9.1 mg/dL (ref 8.9–10.3)
CO2: 25 mmol/L (ref 22–32)
Chloride: 108 mmol/L (ref 98–111)
Creatinine: 1.12 mg/dL (ref 0.61–1.24)
GFR, Est AFR Am: >60 mL/min (ref >60)
GFR, Estimated: >60 mL/min (ref >60)
GLUCOSE: 109 mg/dL — AB (ref 70–99)
Potassium: 4.1 mmol/L (ref 3.5–5.1)
Sodium: 143 mmol/L (ref 135–145)
Total Bilirubin: 0.6 mg/dL (ref 0.3–1.2)
Total Protein: 7.8 g/dL (ref 6.5–8.1)

## 2018-11-04 LAB — CBC WITH DIFFERENTIAL (CANCER CENTER ONLY)
Abs Immature Granulocytes: 0.02 10*3/uL (ref 0.00–0.07)
Basophils Absolute: 0 10*3/uL (ref 0.0–0.1)
Basophils Relative: 0 %
Eosinophils Absolute: 0.4 10*3/uL (ref 0.0–0.5)
Eosinophils Relative: 8 %
HCT: 38.2 % — ABNORMAL LOW (ref 39.0–52.0)
Hemoglobin: 12.4 g/dL — ABNORMAL LOW (ref 13.0–17.0)
Immature Granulocytes: 0 %
LYMPHS ABS: 1 10*3/uL (ref 0.7–4.0)
Lymphocytes Relative: 19 %
MCH: 30.5 pg (ref 26.0–34.0)
MCHC: 32.5 g/dL (ref 30.0–36.0)
MCV: 94.1 fL (ref 80.0–100.0)
MONOS PCT: 16 %
Monocytes Absolute: 0.8 10*3/uL (ref 0.1–1.0)
NEUTROS PCT: 57 %
Neutro Abs: 3 10*3/uL (ref 1.7–7.7)
Platelet Count: 147 10*3/uL — ABNORMAL LOW (ref 150–400)
RBC: 4.06 MIL/uL — ABNORMAL LOW (ref 4.22–5.81)
RDW: 11.9 % (ref 11.5–15.5)
WBC Count: 5.2 10*3/uL (ref 4.0–10.5)
nRBC: 0 % (ref 0.0–0.2)

## 2018-11-04 LAB — TSH: TSH: 28.932 u[IU]/mL — ABNORMAL HIGH (ref 0.320–4.118)

## 2018-11-04 MED ORDER — LEVOTHYROXINE SODIUM 100 MCG PO TABS: 100.0000 ug | ORAL_TABLET | Freq: Every day | ORAL | 0 refills | Status: DC

## 2018-11-04 MED ORDER — SODIUM CHLORIDE 0.9% FLUSH
10.0000 mL | INTRAVENOUS | Status: DC | PRN
  Administered 2018-11-04: 10 mL
  Filled 2018-11-04: qty 10

## 2018-11-04 MED ORDER — HEPARIN SOD (PORK) LOCK FLUSH 100 UNIT/ML IV SOLN
500.0000 [IU] | Freq: Once | INTRAVENOUS | Status: AC | PRN
  Administered 2018-11-04: 500 [IU]
  Filled 2018-11-04: qty 5

## 2018-11-04 MED ORDER — SODIUM CHLORIDE 0.9 % IV SOLN
200.0000 mg | Freq: Once | INTRAVENOUS | Status: AC
  Administered 2018-11-04: 200 mg via INTRAVENOUS
  Filled 2018-11-04: qty 8

## 2018-11-04 MED ORDER — SODIUM CHLORIDE 0.9 % IV SOLN
Freq: Once | INTRAVENOUS | Status: AC
  Administered 2018-11-04: 16:00:00 via INTRAVENOUS
  Filled 2018-11-04: qty 250

## 2018-11-04 MED ORDER — SODIUM CHLORIDE 0.9% FLUSH
10.0000 mL | Freq: Once | INTRAVENOUS | Status: AC
  Administered 2018-11-04: 10 mL
  Filled 2018-11-04: qty 10

## 2018-11-04 NOTE — Assessment & Plan Note (Addendum)
Travis Padilla is a pleasant 73 year old man with stage IV (T3, N0, M1c) non-small cell lung cancer, squamous cell carcinoma based on the biopsy from the left axillary mass. He status post 4 cycles of induction systemic chemotherapy with carboplatin, paclitaxel and Keytruda with partial response.  The patient is currently on maintenance treatment with single agent Keytruda.    1. Lung cancer: His most recent CT chest/abdomen/pelvis on 09/18/2018 shows stable disease in chest and axilla and no progression in his abdomen/pelvis.  He will proceed with treatment today with [facility] as he is tolerating it well.  I will touch base with Dr. Julien Nordmann about when he wants his repeat CT chest to be completed.    I did review with him again in detail his CT chest abdomen and pelvis results from January 9.  We reviewed his aortic atherosclerosis and what that means, and also reviewed the mild prostate enlargement, which is likely related to his age and a component of BPH.  2. Back pain:  Travis Padilla has new persistent back pain.  I ordered plain films of his thoracic and lumbar spine to evaluate further.  He is doing stretches for this pain which is helping.  3. Hypothyroidism: His TSH is elevated again.  I increased his synthroid to 13mcg daily.    4. Constipation: I suggested he add miralax to his regimen.   Travis Padilla will return in 3 weeks for labs, f/u with Dr. Julien Nordmann, and his next cycle of treatment.

## 2018-11-04 NOTE — Progress Notes (Addendum)
[facility] Follow up:    [name], [place] [address]   DIAGNOSIS: Cancer Staging No matching staging information was found for the patient.  SUMMARY OF ONCOLOGIC HISTORY: Oncology History   Patient presented with SOB and hemoptysis.  Work up showed RLL lung mass.         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 Findings are highly concerning for primary bronchogenic neoplasm. In addition, there is a 2.7 x 2.2 x 1.9 cm suspicious appearing nodule in the left upper lobe which likely represents a synchronous primary bronchogenic neoplasm.  In addition, there is a malignant appearing soft tissue mass in the left axilla measuring 3.5 x 3.3 x 3.3 cm. This could represent a nodal metastasis, but this would be highly unusual from a primary bronchogenic neoplasm. The possibility of a nodal metastasis from a lesion on the left upper extremity should be considered, and correlation with cutaneous physical examination is recommended    05/05/2018 Imaging    PET scan Hypermetabolic RIGHT infrahilar mass and hypermetabolic LEFT upper lobe pulmonary nodule. Probable RIGHT lower lobe bronchogenic carcinoma with LEFT upper lobe metastasis. Synchronous carcinoma also consider with lack of mediastinal adenopathy. Hypermetabolic LEFT axillary adenopathy consistent with metastasis.    05/09/2018 Imaging    CT Head IMPRESSION: No evidence of intracranial metastases or acute abnormality    05/09/2018 Surgery    Bronchoscopy    05/09/2018 Pathology Results    Lung, biopsy, right lower lobe mass - POORLY DIFFERENTIATED CARCINOMA - SEE COMMENT Microscopic Comment The neoplastic cells are positive for cytokeratin 5/6 and p63 but  negative for TTF-1. Overall, the features are consistent with a poorly differentiated squamous cell carcinoma. 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[place])    05/20/2018 -  Chemotherapy    The patient had palonosetron (ALOXI) injection 0.25 mg, 0.25 mg, Intravenous,  Once, 4 of 4 cycles Administration: 0.25 mg (05/20/2018), 0.25 mg (06/09/2018), 0.25 mg (07/01/2018), 0.25 mg (07/22/2018) pegfilgrastim-cbqv (UDENYCA) injection 6 mg, 6 mg, Subcutaneous, Once, 4 of 4 cycles Administration: 6 mg (05/22/2018), 6 mg (06/11/2018), 6 mg (07/03/2018), 6 mg (07/24/2018) CARBOplatin (PARAPLATIN) 570 mg in sodium chloride 0.9 % 250 mL chemo infusion, 570 mg (100 % of original dose 574 mg), Intravenous,  Once, 4 of 4 cycles Dose modification: 574 mg (original dose 574 mg, Cycle 1), 645.5 mg (original dose 574 mg, Cycle 2, Reason: Provider Judgment), 645.5 mg (original dose 574 mg, Cycle 3, Reason: Provider Judgment), 638 mg (original dose 574 mg, Cycle 4, Reason: Change in SCr/CrCl) Administration: 650 mg (06/09/2018), 650 mg (07/01/2018), 640 mg (07/22/2018) PACLitaxel (TAXOL) 414 mg in sodium chloride 0.9 % 500 mL chemo infusion (> 80mg /m2), 175 mg/m2 = 414 mg (100 % of original dose 175 mg/m2), Intravenous,  Once, 4 of 4 cycles Dose modification: 175 mg/m2 (original dose 175 mg/m2, Cycle 1, Reason: Provider Judgment) Administration: 414 mg (05/20/2018), 414 mg (06/09/2018), 414 mg (07/01/2018), 414 mg (07/22/2018) pembrolizumab (KEYTRUDA) 200 mg in sodium chloride 0.9 % 50 mL chemo infusion, 200 mg, Intravenous, Once, 10 of 14 cycles Administration: 200 mg (05/20/2018), 200 mg (06/09/2018),  200 mg (08/11/2018), 200 mg (09/01/2018), 200 mg (07/01/2018), 200 mg (07/22/2018), 200 mg (09/22/2018), 200 mg (10/14/2018), 200 mg (11/04/2018) fosaprepitant (EMEND) 150 mg, dexamethasone (DECADRON) 12 mg in sodium chloride 0.9 % 145 mL IVPB, ,  Intravenous,  Once, 4 of 4 cycles Administration:  (05/20/2018),  (06/09/2018),  (07/01/2018),  (07/22/2018)  for chemotherapy treatment.      CURRENT THERAPY: Keytruda  INTERVAL HISTORY: MERCY Padilla 73 y.o. male returns for evaluation prior to maintenance Keytruda.  He is doing moderately well.  He continues to have a cough which remians at baseline.  He has some constipation.  He uses Senna, however it isn't helping.  He is also drinking prune juice.  He has ongoing neuropathy.  He was started on Gabapentin, and he didn't like how it made him feel, so he stopped taking it.  He is using CBD oil which is helping.  He is concerned about his mildly enlarged prostate gland seen on CT scan from January.  He has ongoing lower back pain and he is taking synthroid for his thyroid, and he struggles with cold feet frequently.     Patient Active Problem List   Diagnosis Date Noted  . Port-A-Cath in place 08/11/2018  . Hypothyroidism 08/11/2018  . 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 Encounter for antineoplastic chemotherapy 05/13/2018  . Encounter for antineoplastic immunotherapy 05/13/2018  . Goals of care, counseling/discussion 05/13/2018  . 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 Axillary adenopathy 04/24/2018  . Right lower lobe lung mass 04/22/2018  . Cough with hemoptysis 04/22/2018  . Wheezing 04/02/2018  . Long-term use of high-risk medication 04/02/2018  . Dizziness 06/26/2017  . Fatigue 04/30/2016  . Insomnia 04/30/2016  . Hyperlipidemia 01/31/2016  . Ear pain 05/09/2015  . Anemia   . Abnormal nuclear stress test   . OBESITY, UNSPECIFIED 03/28/2009  . ESSENTIAL HYPERTENSION, BENIGN 03/28/2009  . PALPITATIONS 03/28/2009  . SNORING 03/28/2009    is allergic to trazodone and nefazodone.  MEDICAL HISTORY: Past Medical History:  Diagnosis Date  . Abnormal nuclear stress test    December, 2013  . Anemia   . CAD (coronary artery disease)    Mild  nonobstructive plaque in cath 2013  . Chest pain    December, 2013  . Dizziness   . Gout   . Hemorrhoid   . Hyperlipidemia   . Hypertension   . Skin cancer   . Spinal stenosis     SURGICAL HISTORY: Past Surgical History:  Procedure Laterality Date  . 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 BELPHAROPTOSIS REPAIR Bilateral   . CATARACT EXTRACTION W/ INTRAOCULAR LENS  IMPLANT, BILATERAL    . COLONOSCOPY N/A 11/03/2014   Procedure: COLONOSCOPY;  Surgeon: Rogene Houston, MD;  Location: [facility];  Service: Endoscopy;  Laterality: N/A;  1225  . IR IMAGING GUIDED PORT INSERTION  07/11/2018  . KNEE CARTILAGE SURGERY Left    Left knee  . SKIN CANCER EXCISION  12/2014, 04/25/15  . VIDEO BRONCHOSCOPY Bilateral 05/09/2018   Procedure: VIDEO BRONCHOSCOPY WITH FLUORO;  Surgeon: Juanito Doom, MD;  Location: [facility];  Service: Cardiopulmonary;  Laterality: Bilateral;    SOCIAL HISTORY: Social History   Socioeconomic History  . Marital status: Married    Spouse name: Not on file  . Number of children: 1  . Years of education: Not on file  . Highest education level: Not on file  Occupational History  .  Occupation: Government social research officer  Social Needs  . Financial resource strain: Not on file  . Food insecurity:    Worry: Not on file    Inability: Not on file  . Transportation needs:    Medical: Not on file    Non-medical: Not on file  Tobacco Use  . Smoking status: Former Smoker    Packs/day: 0.20    Years: 2.00    Pack years: 0.40    Types: Cigarettes    Start date: 10/01/1968  . Smokeless tobacco: Never Used  Substance and Sexual Activity  . Alcohol use: No    Comment: hx of   . Drug use: No  . Sexual activity: Not on file  Lifestyle  . Physical activity:    Days per week: Not on file    Minutes per session: Not on file  . Stress: Not on file  Relationships  . Social connections:    Talks on phone: Not on file    Gets together: Not on file    Attends religious  service: Not on file    Active member of club or organization: Not on file    Attends meetings of clubs or organizations: Not on file    Relationship status: Not on file  . Intimate partner violence:    Fear of current or ex partner: Not on file    Emotionally abused: Not on file    Physically abused: Not on file    Forced sexual activity: Not on file  Other Topics Concern  . Not on file  Social History Narrative   Unable to ask intimate partner violence questions, wife present    FAMILY HISTORY: Family History  Problem Relation Age of Onset  . Heart attack Father   . Heart failure Mother   . Parkinson's disease Mother   . Healthy Sister   . Skin cancer Sister   . Neuropathy Brother   . COPD Brother   . Epilepsy Brother   . Healthy Sister   . Healthy Sister   . Colon cancer Neg Hx     Review of Systems  Constitutional: Negative for appetite change, chills, fatigue, fever and unexpected weight change.  HENT:   Negative for hearing loss, lump/mass, sore throat and trouble swallowing.   Eyes: Negative for eye problems and icterus.  Respiratory: Negative for chest tightness, cough and shortness of breath.   Cardiovascular: Negative for chest pain, leg swelling and palpitations.  Gastrointestinal: Positive for constipation. Negative for abdominal distention, abdominal pain, diarrhea, nausea and vomiting.  Endocrine: Negative for hot flashes.  Genitourinary: Negative for difficulty urinating.   Musculoskeletal: Positive for back pain. Negative for arthralgias.  Skin: Negative for itching and rash.  Neurological: Positive for numbness. Negative for dizziness and headaches.  Psychiatric/Behavioral: Negative for depression. The patient is not nervous/anxious.       PHYSICAL EXAMINATION  ECOG PERFORMANCE STATUS: 1 - Symptomatic but completely ambulatory  Vitals:   11/04/18 1422  BP: 113/69  Pulse: 79  Resp: 18  Temp: (!) 97.3 F (36.3 C)  SpO2: 98%    Physical  Exam Constitutional:      General: He is not in acute distress.    Appearance: Normal appearance.  HENT:     Head: Normocephalic and atraumatic.     Nose: No congestion.     Mouth/Throat:     Mouth: Mucous membranes are moist.     Pharynx: Oropharynx is clear. No oropharyngeal exudate or posterior oropharyngeal erythema.  Eyes:  General: No scleral icterus.    Pupils: Pupils are equal, round, and reactive to light.  Neck:     Musculoskeletal: Normal range of motion and neck supple.  Cardiovascular:     Rate and Rhythm: Normal rate and regular rhythm.     Pulses: Normal pulses.     Heart sounds: Normal heart sounds.  Pulmonary:     Effort: Pulmonary effort is normal. No respiratory distress.     Breath sounds: Normal breath sounds.  Abdominal:     General: Abdomen is flat. Bowel sounds are normal. There is no distension.     Palpations: Abdomen is soft.     Tenderness: There is no abdominal tenderness.  Musculoskeletal:        General: No swelling.  Lymphadenopathy:     Cervical: No cervical adenopathy.  Skin:    General: Skin is warm and dry.     Capillary Refill: Capillary refill takes less than 2 seconds.  Neurological:     General: No focal deficit present.     Mental Status: He is alert.  Psychiatric:        Mood and Affect: Mood normal.        Behavior: Behavior normal.     LABORATORY DATA:  CBC    Component Value Date/Time   WBC 5.2 11/04/2018 1348   WBC 28.8 (H) 07/25/2018 2031   RBC 4.06 (L) 11/04/2018 1348   HGB 12.4 (L) 11/04/2018 1348   HGB 13.0 12/04/2017 1202   HCT 38.2 (L) 11/04/2018 1348   HCT 39.6 12/04/2017 1202   PLT 147 (L) 11/04/2018 1348   PLT 218 12/04/2017 1202   MCV 94.1 11/04/2018 1348   MCV 92 12/04/2017 1202   MCH 30.5 11/04/2018 1348   MCHC 32.5 11/04/2018 1348   RDW 11.9 11/04/2018 1348   RDW 13.8 12/04/2017 1202   LYMPHSABS 1.0 11/04/2018 1348   LYMPHSABS 1.8 12/04/2017 1202   MONOABS 0.8 11/04/2018 1348   EOSABS 0.4  11/04/2018 1348   EOSABS 0.2 12/04/2017 1202   BASOSABS 0.0 11/04/2018 1348   BASOSABS 0.0 12/04/2017 1202    CMP     Component Value Date/Time   NA 143 11/04/2018 1348   NA 144 04/09/2018 1507   K 4.1 11/04/2018 1348   CL 108 11/04/2018 1348   CO2 25 11/04/2018 1348   GLUCOSE 109 (H) 11/04/2018 1348   BUN 16 11/04/2018 1348   BUN 15 04/09/2018 1507   CREATININE 1.12 11/04/2018 1348   CALCIUM 9.1 11/04/2018 1348   PROT 7.8 11/04/2018 1348   PROT 7.2 05/28/2017 1417   ALBUMIN 4.0 11/04/2018 1348   ALBUMIN 4.2 05/28/2017 1417   AST 17 11/04/2018 1348   ALT 13 11/04/2018 1348   ALKPHOS 57 11/04/2018 1348   BILITOT 0.6 11/04/2018 1348   GFRNONAA >60 11/04/2018 1348   GFRAA >60 11/04/2018 1348      ASSESSMENT and PLAN:   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[facility]) Travis Padilla is a pleasant 73 year old man with stage IV (T3, N0, M1c) non-small cell lung cancer, squamous cell carcinoma based on the biopsy from the left axillary mass. He status post 4 cycles of induction systemic chemotherapy with carboplatin, paclitaxel and Keytruda with partial response.  The patient is currently on maintenance treatment with single agent Keytruda.    1. Lung cancer: His most recent CT chest/abdomen/pelvis on 09/18/2018 shows stable disease in chest and axilla and no progression in his abdomen/pelvis.  He will  proceed with treatment today with Keytruda as he is tolerating it well.  I will touch base with Dr. Julien Nordmann about when he wants his repeat CT chest to be completed.    I did review with him again in detail his CT chest abdomen and pelvis results from January 9.  We reviewed his aortic atherosclerosis and what that means, and also reviewed the mild prostate enlargement, which is likely related to his age and a component of BPH.  2. Back pain:  Perle has new persistent back pain.  I ordered plain films of his thoracic and lumbar spine to evaluate further.  He is doing stretches for this pain  which is helping.  3. Hypothyroidism: His TSH is elevated again.  I increased his synthroid to 174mcg daily.    4. Constipation: I suggested he add miralax to his regimen.   Mandell will return in 3 weeks for labs, f/u with Dr. Julien Nordmann, and his next cycle of treatment.     Orders Placed This Encounter  Procedures  . DG Lumbar Spine 2-3 Views    Standing Status:   Future    Standing Expiration Date:   11/04/2019    Order Specific Question:   Reason for Exam (SYMPTOM  OR DIAGNOSIS REQUIRED)    Answer:   back pain, h/o lung cancer    Order Specific Question:   Preferred imaging location?    Answer:   [facility]    Order Specific Question:   Radiology Contrast Protocol - do NOT remove file path    Answer:   \\[email or web address].pdf  . DG Thoracic Spine 2 View    Standing Status:   Future    Standing Expiration Date:   11/04/2019    Order Specific Question:   Reason for Exam (SYMPTOM  OR DIAGNOSIS REQUIRED)    Answer:   back pain, lung cancer    Order Specific Question:   Preferred imaging location?    Answer:   [facility]    Order Specific Question:   Radiology Contrast Protocol - do NOT remove file path    Answer:   \\[email or web address]s.pdf  . CT Chest W Contrast    Standing Status:   Future    Standing Expiration Date:   11/04/2019    Order Specific Question:   If indicated for the ordered procedure, I authorize the administration of contrast media per Radiology protocol    Answer:   Yes    Order Specific Question:   Preferred imaging location?    Answer:   [facility]    Order Specific Question:   Radiology Contrast Protocol - do NOT remove file path    Answer:   \\[email or web address].[email or web address]    All questions were answered. The patient knows to call the clinic with any problems, questions or concerns. We can certainly see the patient much sooner if necessary.  A total of  (30) minutes of face-to-face time was spent with this patient with greater than 50% of that time in counseling and care-coordination.  This note was electronically signed. Scot Dock, NP 11/04/2018

## 2018-11-04 NOTE — Progress Notes (Signed)
Okay to treat today with elevated TSH, per Mendel Ryder, NP.

## 2018-11-04 NOTE — Addendum Note (Signed)
Addended by: Scot Dock on: 11/04/2018 05:33 PM   Modules accepted: Orders

## 2018-11-05 ENCOUNTER — Telehealth: Payer: Self-pay

## 2018-11-05 ENCOUNTER — Telehealth: Payer: Self-pay | Admitting: Adult Health

## 2018-11-05 NOTE — Telephone Encounter (Signed)
Nurse called to schedule Chest CT for patient.  Patient unable to go on date given.  Nurse gave number for scheduling to patient so he could call and reschedule as needed.

## 2018-11-05 NOTE — Telephone Encounter (Signed)
No los °

## 2018-11-05 NOTE — Telephone Encounter (Signed)
-----   Message from Gardenia Phlegm, NP sent at 11/04/2018  5:33 PM EST ----- Please call patient and let him know that he needs repeat scan before next visit with MM.  I have placed order.  Please ensure it gets scheduled.  Thanks, Mendel Ryder ----- Message ----- From: Curt Bears, MD Sent: 11/04/2018   4:56 PM EST To: Gardenia Phlegm, NP  He needs repeat scan before next visit with me or Cassie in 3 weeks. Thank you. ----- Message ----- From: Gardenia Phlegm, NP Sent: 11/04/2018   4:52 PM EST To: Curt Bears, MD  Absolutely loved this family!!!!  Anyhow, he wants to know when you want to do repeat CT chest.  IF you want it before 11/2018 visit or 12/2018 visit.  I told him I would ask and then have my nurse to call him and let him know.   Last CT chest in January of this year.    Mendel Ryder

## 2018-11-06 ENCOUNTER — Ambulatory Visit (HOSPITAL_COMMUNITY)
Admission: RE | Admit: 2018-11-06 | Discharge: 2018-11-06 | Disposition: A | Discharge status: Home / Self Care | Payer: Medicare Other | Source: Ambulatory Visit | Attending: Adult Health | Admitting: Adult Health

## 2018-11-06 DIAGNOSIS — C3491 Malignant neoplasm of unspecified part of right bronchus or lung: Secondary | ICD-10-CM

## 2018-11-06 DIAGNOSIS — M545 Low back pain: Secondary | ICD-10-CM | POA: Diagnosis not present

## 2018-11-06 DIAGNOSIS — M546 Pain in thoracic spine: Secondary | ICD-10-CM | POA: Diagnosis not present

## 2018-11-07 ENCOUNTER — Telehealth: Payer: Self-pay

## 2018-11-07 NOTE — Telephone Encounter (Signed)
-----   Message from Gardenia Phlegm, NP sent at 11/07/2018 10:10 AM EST ----- Please let patient know that his x rays show some degeneration, but nothing else worrisome.  Thanks,  Mendel Ryder ----- Message ----- From: Buel Ream, Rad Results In Sent: 11/07/2018   9:58 AM EST To: Gardenia Phlegm, NP

## 2018-11-07 NOTE — Telephone Encounter (Signed)
Spoke with patient to inform that x rays show some degeneration but no other worrisome issues.  Patient happy to hear and voiced understanding of information.  Knows to call center for any concerns.

## 2018-11-11 DIAGNOSIS — L820 Inflamed seborrheic keratosis: Secondary | ICD-10-CM | POA: Diagnosis not present

## 2018-11-11 DIAGNOSIS — D225 Melanocytic nevi of trunk: Secondary | ICD-10-CM | POA: Diagnosis not present

## 2018-11-11 DIAGNOSIS — H04123 Dry eye syndrome of bilateral lacrimal glands: Secondary | ICD-10-CM | POA: Diagnosis not present

## 2018-11-11 DIAGNOSIS — L821 Other seborrheic keratosis: Secondary | ICD-10-CM | POA: Diagnosis not present

## 2018-11-11 DIAGNOSIS — Z85828 Personal history of other malignant neoplasm of skin: Secondary | ICD-10-CM | POA: Diagnosis not present

## 2018-11-11 DIAGNOSIS — Z85820 Personal history of malignant melanoma of skin: Secondary | ICD-10-CM | POA: Diagnosis not present

## 2018-11-11 DIAGNOSIS — L570 Actinic keratosis: Secondary | ICD-10-CM | POA: Diagnosis not present

## 2018-11-11 DIAGNOSIS — C44519 Basal cell carcinoma of skin of other part of trunk: Secondary | ICD-10-CM | POA: Diagnosis not present

## 2018-11-11 DIAGNOSIS — D2261 Melanocytic nevi of right upper limb, including shoulder: Secondary | ICD-10-CM | POA: Diagnosis not present

## 2018-11-11 DIAGNOSIS — D1801 Hemangioma of skin and subcutaneous tissue: Secondary | ICD-10-CM | POA: Diagnosis not present

## 2018-11-11 DIAGNOSIS — L814 Other melanin hyperpigmentation: Secondary | ICD-10-CM | POA: Diagnosis not present

## 2018-11-11 DIAGNOSIS — H43812 Vitreous degeneration, left eye: Secondary | ICD-10-CM | POA: Diagnosis not present

## 2018-11-13 ENCOUNTER — Ambulatory Visit: Payer: Medicare Other | Admitting: Pediatrics

## 2018-11-18 ENCOUNTER — Ambulatory Visit (HOSPITAL_COMMUNITY): Payer: Medicare Other

## 2018-11-24 ENCOUNTER — Other Ambulatory Visit: Payer: Self-pay | Admitting: Medical Oncology

## 2018-11-24 ENCOUNTER — Other Ambulatory Visit: Payer: Self-pay

## 2018-11-24 ENCOUNTER — Ambulatory Visit (HOSPITAL_COMMUNITY)
Admission: RE | Admit: 2018-11-24 | Discharge: 2018-11-24 | Disposition: A | Discharge status: Home / Self Care | Payer: Medicare Other | Source: Ambulatory Visit | Attending: Adult Health | Admitting: Adult Health

## 2018-11-24 DIAGNOSIS — C3491 Malignant neoplasm of unspecified part of right bronchus or lung: Secondary | ICD-10-CM | POA: Diagnosis not present

## 2018-11-24 DIAGNOSIS — R0602 Shortness of breath: Secondary | ICD-10-CM | POA: Diagnosis not present

## 2018-11-24 MED ORDER — IOHEXOL 300 MG/ML  SOLN
75.0000 mL | Freq: Once | INTRAMUSCULAR | Status: AC | PRN
  Administered 2018-11-24: 75 mL via INTRAVENOUS

## 2018-11-24 MED ORDER — HEPARIN SOD (PORK) LOCK FLUSH 100 UNIT/ML IV SOLN
500.0000 [IU] | Freq: Once | INTRAVENOUS | Status: AC
  Administered 2018-11-24: 500 [IU] via INTRAVENOUS

## 2018-11-24 MED ORDER — SODIUM CHLORIDE (PF) 0.9 % IJ SOLN
INTRAMUSCULAR | Status: AC
  Filled 2018-11-24: qty 50

## 2018-11-24 MED ORDER — HEPARIN SOD (PORK) LOCK FLUSH 100 UNIT/ML IV SOLN
INTRAVENOUS | Status: AC
  Filled 2018-11-24: qty 5

## 2018-11-25 ENCOUNTER — Inpatient Hospital Stay (HOSPITAL_BASED_OUTPATIENT_CLINIC_OR_DEPARTMENT_OTHER): Payer: Medicare Other | Admitting: Medical

## 2018-11-25 ENCOUNTER — Other Ambulatory Visit: Payer: Self-pay | Admitting: Internal Medicine

## 2018-11-25 ENCOUNTER — Inpatient Hospital Stay: Payer: Medicare Other

## 2018-11-25 ENCOUNTER — Other Ambulatory Visit: Payer: Self-pay | Admitting: Medical

## 2018-11-25 ENCOUNTER — Encounter: Payer: Self-pay | Admitting: Internal Medicine

## 2018-11-25 ENCOUNTER — Inpatient Hospital Stay: Payer: Medicare Other | Attending: Internal Medicine

## 2018-11-25 ENCOUNTER — Telehealth: Payer: Self-pay | Admitting: *Deleted

## 2018-11-25 ENCOUNTER — Other Ambulatory Visit: Payer: Self-pay | Admitting: *Deleted

## 2018-11-25 ENCOUNTER — Other Ambulatory Visit: Payer: Self-pay

## 2018-11-25 ENCOUNTER — Telehealth: Payer: Self-pay | Admitting: Internal Medicine

## 2018-11-25 ENCOUNTER — Inpatient Hospital Stay (HOSPITAL_BASED_OUTPATIENT_CLINIC_OR_DEPARTMENT_OTHER): Payer: Medicare Other | Admitting: Internal Medicine

## 2018-11-25 VITALS — BP 108/66 | HR 68 | Temp 98.1°F | Resp 18 | Ht 73.0 in | Wt 242.4 lb

## 2018-11-25 VITALS — BP 118/76 | HR 71 | Temp 98.7°F | Resp 16

## 2018-11-25 DIAGNOSIS — E039 Hypothyroidism, unspecified: Secondary | ICD-10-CM

## 2018-11-25 DIAGNOSIS — C3491 Malignant neoplasm of unspecified part of right bronchus or lung: Secondary | ICD-10-CM

## 2018-11-25 DIAGNOSIS — C3412 Malignant neoplasm of upper lobe, left bronchus or lung: Secondary | ICD-10-CM | POA: Insufficient documentation

## 2018-11-25 DIAGNOSIS — E785 Hyperlipidemia, unspecified: Secondary | ICD-10-CM | POA: Diagnosis not present

## 2018-11-25 DIAGNOSIS — Z79899 Other long term (current) drug therapy: Secondary | ICD-10-CM

## 2018-11-25 DIAGNOSIS — R21 Rash and other nonspecific skin eruption: Secondary | ICD-10-CM

## 2018-11-25 DIAGNOSIS — E032 Hypothyroidism due to medicaments and other exogenous substances: Secondary | ICD-10-CM

## 2018-11-25 DIAGNOSIS — I10 Essential (primary) hypertension: Secondary | ICD-10-CM | POA: Insufficient documentation

## 2018-11-25 DIAGNOSIS — Z95828 Presence of other vascular implants and grafts: Secondary | ICD-10-CM

## 2018-11-25 DIAGNOSIS — Z5112 Encounter for antineoplastic immunotherapy: Secondary | ICD-10-CM | POA: Insufficient documentation

## 2018-11-25 DIAGNOSIS — C7801 Secondary malignant neoplasm of right lung: Secondary | ICD-10-CM

## 2018-11-25 LAB — CMP (CANCER CENTER ONLY)
ALT: 13 U/L (ref 0–44)
AST: 15 U/L (ref 15–41)
Albumin: 3.9 g/dL (ref 3.5–5.0)
Alkaline Phosphatase: 56 U/L (ref 38–126)
Anion gap: 12 (ref 5–15)
BUN: 16 mg/dL (ref 8–23)
CHLORIDE: 111 mmol/L (ref 98–111)
CO2: 23 mmol/L (ref 22–32)
Calcium: 9 mg/dL (ref 8.9–10.3)
Creatinine: 1.04 mg/dL (ref 0.61–1.24)
GFR, Est AFR Am: >60 mL/min (ref >60)
GFR, Estimated: >60 mL/min (ref >60)
Glucose, Bld: 97 mg/dL (ref 70–99)
Potassium: 4.6 mmol/L (ref 3.5–5.1)
Sodium: 146 mmol/L — ABNORMAL HIGH (ref 135–145)
Total Bilirubin: 0.5 mg/dL (ref 0.3–1.2)
Total Protein: 7.7 g/dL (ref 6.5–8.1)

## 2018-11-25 LAB — CBC WITH DIFFERENTIAL (CANCER CENTER ONLY)
Abs Immature Granulocytes: 0.02 10*3/uL (ref 0.00–0.07)
Basophils Absolute: 0 10*3/uL (ref 0.0–0.1)
Basophils Relative: 0 %
EOS ABS: 0.5 10*3/uL (ref 0.0–0.5)
Eosinophils Relative: 10 %
HCT: 39.1 % (ref 39.0–52.0)
Hemoglobin: 12.8 g/dL — ABNORMAL LOW (ref 13.0–17.0)
Immature Granulocytes: 0 %
Lymphocytes Relative: 14 %
Lymphs Abs: 0.7 10*3/uL (ref 0.7–4.0)
MCH: 30.8 pg (ref 26.0–34.0)
MCHC: 32.7 g/dL (ref 30.0–36.0)
MCV: 94 fL (ref 80.0–100.0)
Monocytes Absolute: 0.8 10*3/uL (ref 0.1–1.0)
Monocytes Relative: 16 %
Neutro Abs: 2.8 10*3/uL (ref 1.7–7.7)
Neutrophils Relative %: 60 %
Platelet Count: 147 10*3/uL — ABNORMAL LOW (ref 150–400)
RBC: 4.16 MIL/uL — ABNORMAL LOW (ref 4.22–5.81)
RDW: 12 % (ref 11.5–15.5)
WBC Count: 4.7 10*3/uL (ref 4.0–10.5)
nRBC: 0 % (ref 0.0–0.2)

## 2018-11-25 LAB — TSH: TSH: 27.921 u[IU]/mL — AB (ref 0.320–4.118)

## 2018-11-25 MED ORDER — HYDROXYZINE HCL 25 MG PO TABS: 25.0000 mg | ORAL_TABLET | Freq: Three times a day (TID) | ORAL | 2 refills | Status: DC

## 2018-11-25 MED ORDER — LEVOTHYROXINE SODIUM 125 MCG PO TABS: 125.0000 ug | ORAL_TABLET | Freq: Every day | ORAL | 1 refills | Status: DC

## 2018-11-25 MED ORDER — SODIUM CHLORIDE 0.9% FLUSH
10.0000 mL | Freq: Once | INTRAVENOUS | Status: AC
  Administered 2018-11-25: 10 mL
  Filled 2018-11-25: qty 10

## 2018-11-25 MED ORDER — TRIAMCINOLONE ACETONIDE 0.1 % EX LOTN: 1.0000 "application " | TOPICAL_LOTION | Freq: Three times a day (TID) | CUTANEOUS | 2 refills | Status: DC

## 2018-11-25 MED ORDER — HEPARIN SOD (PORK) LOCK FLUSH 100 UNIT/ML IV SOLN
500.0000 [IU] | Freq: Once | INTRAVENOUS | Status: AC | PRN
  Administered 2018-11-25: 500 [IU]
  Filled 2018-11-25: qty 5

## 2018-11-25 MED ORDER — SODIUM CHLORIDE 0.9 % IV SOLN
200.0000 mg | Freq: Once | INTRAVENOUS | Status: AC
  Administered 2018-11-25: 200 mg via INTRAVENOUS
  Filled 2018-11-25: qty 8

## 2018-11-25 MED ORDER — SODIUM CHLORIDE 0.9 % IV SOLN
Freq: Once | INTRAVENOUS | Status: AC
  Administered 2018-11-25: 13:00:00 via INTRAVENOUS
  Filled 2018-11-25: qty 250

## 2018-11-25 MED ORDER — SODIUM CHLORIDE 0.9% FLUSH
10.0000 mL | INTRAVENOUS | Status: DC | PRN
  Administered 2018-11-25: 10 mL
  Filled 2018-11-25: qty 10

## 2018-11-25 NOTE — Patient Instructions (Signed)
[facility] Discharge Instructions for Patients Receiving Chemotherapy  Today you received the following chemotherapy agent: Pembrolizumab [facility]).  To help prevent nausea and vomiting after your treatment, we encourage you to take your nausea medication as prescribed.   If you develop nausea and vomiting that is not controlled by your nausea medication, call the clinic.   BELOW ARE SYMPTOMS THAT SHOULD BE REPORTED IMMEDIATELY:  *FEVER GREATER THAN 100.5 F  *CHILLS WITH OR WITHOUT FEVER  NAUSEA AND VOMITING THAT IS NOT CONTROLLED WITH YOUR NAUSEA MEDICATION  *UNUSUAL SHORTNESS OF BREATH  *UNUSUAL BRUISING OR BLEEDING  TENDERNESS IN MOUTH AND THROAT WITH OR WITHOUT PRESENCE OF ULCERS  *URINARY PROBLEMS  *BOWEL PROBLEMS  UNUSUAL RASH Items with * indicate a potential emergency and should be followed up as soon as possible.  Feel free to call the clinic should you have any questions or concerns. The clinic phone number is [phone].  Please show the [place] at check-in to the Emergency Department and triage nurse.

## 2018-11-25 NOTE — Progress Notes (Signed)
Upon completion of Keytruda, patient c/o feeling flushed.  VS WNL.  Redness to trunk upon assessment.  Sandi Mealy, PA-C assessed patient in infusion area.  Verbal orders to administer Pepcid.  Patient symptoms subsided after administration of Pepcid.  Patient was discharged with no distress.

## 2018-11-25 NOTE — Telephone Encounter (Signed)
Reviewed labs with Dr. Julien Nordmann. Pt's TSH remains elevated. Dr. Julien Nordmann has increased his levothyroxine to 125 mcg daily. TCT patient and advised him of the same. He voiced understanding. He states he had a reaction to his Beryle Flock today and was seen by Sandi Mealy, PA. Patient is home now and is feeling ok. His wife will be picking up prescriptions.

## 2018-11-25 NOTE — Telephone Encounter (Signed)
Scheduled appt per 3/17 los.  Printed calendar and avs.

## 2018-11-25 NOTE — Progress Notes (Signed)
[place] Telephone:([phone]) [phone]   Fax:([phone]) [phone]  OFFICE PROGRESS NOTE  [name], [place] [address]  DIAGNOSIS: Stage IV (T3, N0, M1c) non-small cell lung cancer, squamous cell carcinoma presented with large right infrahilar mass in addition to left upper lobe lung nodule as well as left axillary mass with left axillary lymph node diagnosed in August 2019.  PRIOR THERAPY:  1) Palliative radiotherapy to the right infrahilar mass as well as the axillary mass under the care of Dr. Lisbeth Renshaw. 2) Systemic chemotherapy with carboplatin for AUC of 5, paclitaxel 175 mg/M2 and Keytruda 200 mg IV every 3 weeks status post 4 cycles.  CURRENT THERAPY: Maintenance immunotherapy with single agent Keytruda 200 mg IV every 3 weeks status post 6 cycles.  INTERVAL HISTORY: Travis Padilla 73 y.o. male returns to the clinic today for follow-up visit accompanied by his wife.  The patient is feeling fine today with no concerning complaints.  He has more energy.  He denied having any chest pain, shortness of breath, cough or hemoptysis but has occasional wheezing.  He denied having any fever or chills.  He has no nausea, vomiting, diarrhea or constipation.  He denied having any skin rash or diarrhea.  He denied having any headache or visual changes.  He has been tolerating his treatment with Keytruda fairly well.  The patient had repeat CT scan of the chest performed recently and he is here for evaluation and discussion of his scan results.  MEDICAL HISTORY: Past Medical History:  Diagnosis Date   Abnormal nuclear stress test    December, 2013   Anemia    CAD (coronary artery disease)    Mild nonobstructive plaque in cath 2013   Chest pain    December, 2013   Dizziness    Gout    Hemorrhoid    Hyperlipidemia    Hypertension    Skin cancer    Spinal stenosis     ALLERGIES:  is allergic to trazodone and nefazodone.  MEDICATIONS:    Current Outpatient Medications  Medication Sig Dispense Refill   albuterol (PROVENTIL) (2.5 MG/3ML) 0.083% nebulizer solution Take 3 mLs (2.5 mg total) by nebulization every 6 (six) hours as needed for wheezing or shortness of breath. (Patient not taking: Reported on 08/11/2018) 75 mL 12   ALPRAZolam (XANAX) 0.25 MG tablet Take 1 tablet (0.25 mg total) by mouth at bedtime as needed for anxiety. 30 tablet 0   Artificial Tear Solution (SOOTHE XP OP) Place 1 drop into both eyes 2 (two) times daily.     carvedilol (COREG) 12.5 MG tablet TAKE 1 AND 1/2 TABLETS BY MOUTH TWO TIMES DAILY WITH A MEAL (Patient taking differently: Take 18.75 mg by mouth 2 (two) times daily with a meal. ) 270 tablet 3   diphenhydramine-acetaminophen (TYLENOL PM) 25-500 MG TABS tablet Take 1-2 tablets by mouth at bedtime as needed (pain).     gabapentin (NEURONTIN) 100 MG capsule TAKE 1 CAPSULE BY MOUTH THREE TIMES A DAY (Patient not taking: Reported on 11/04/2018) 270 capsule 1   Glucosamine-Chondroitin (OSTEO BI-FLEX REGULAR STRENGTH PO) Take 1 tablet by mouth daily.     hydrocortisone (ANUSOL-HC) 2.5 % rectal cream Place 1 application rectally 4 (four) times daily. (Patient taking differently: Place 1 application rectally 4 (four) times daily as needed for hemorrhoids. ) 30 g 3   levothyroxine (SYNTHROID, LEVOTHROID) 100 MCG tablet Take 1 tablet (100 mcg total) by mouth  daily before breakfast. 30 tablet 0   lidocaine-prilocaine (EMLA) cream Apply 1 application topically as needed. (Patient taking differently: Apply 1 application topically daily as needed (port). ) 30 g 0   mirtazapine (REMERON) 15 MG tablet Take 1 tablet (15 mg total) by mouth at bedtime. (Patient taking differently: Take 15 mg by mouth at bedtime as needed (sleep). ) 30 tablet 5   Multiple Vitamin (MULTIVITAMIN) tablet Take 1 tablet by mouth daily.     Multiple Vitamins-Minerals (PRESERVISION AREDS 2 PO) Take by mouth.     oxyCODONE-acetaminophen  (PERCOCET/ROXICET) 5-325 MG tablet Take 1 tablet by mouth every 8 (eight) hours as needed for severe pain. (Patient not taking: Reported on 11/04/2018) 30 tablet 0   pravastatin (PRAVACHOL) 40 MG tablet TAKE 1 TABLET BY MOUTH EVERY DAY (Patient taking differently: Take 40 mg by mouth at bedtime. ) 90 tablet 3   prochlorperazine (COMPAZINE) 10 MG tablet Take 1 tablet (10 mg total) by mouth every 6 (six) hours as needed for nausea or vomiting. (Patient not taking: Reported on 09/01/2018) 30 tablet 1   senna-docusate (SENNA S) 8.6-50 MG tablet 1 to 2 twice daily for constipation (Patient taking differently: Take 1 tablet by mouth 2 (two) times daily. ) 120 tablet 5   No current facility-administered medications for this visit.     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   BELPHAROPTOSIS REPAIR Bilateral    CATARACT EXTRACTION W/ INTRAOCULAR LENS  IMPLANT, BILATERAL     COLONOSCOPY N/A 11/03/2014   Procedure: COLONOSCOPY;  Surgeon: Rogene Houston, MD;  Location: [facility];  Service: Endoscopy;  Laterality: N/A;  1225   IR IMAGING GUIDED PORT INSERTION  07/11/2018   KNEE CARTILAGE SURGERY Left    Left knee   SKIN CANCER EXCISION  12/2014, 04/25/15   VIDEO BRONCHOSCOPY Bilateral 05/09/2018   Procedure: VIDEO BRONCHOSCOPY WITH FLUORO;  Surgeon: Juanito Doom, MD;  Location: [facility];  Service: Cardiopulmonary;  Laterality: Bilateral;    REVIEW OF SYSTEMS:  Constitutional: negative Eyes: negative Ears, nose, mouth, throat, and face: negative Respiratory: negative Cardiovascular: negative Gastrointestinal: negative Genitourinary:negative Integument/breast: negative Hematologic/lymphatic: negative Musculoskeletal:negative Neurological: negative Behavioral/Psych: negative Endocrine: negative Allergic/Immunologic: negative   PHYSICAL EXAMINATION: General appearance: alert, cooperative, fatigued and no  distress Head: Normocephalic, without obvious abnormality, atraumatic Neck: no adenopathy, no JVD, supple, symmetrical, trachea midline and thyroid not enlarged, symmetric, no tenderness/mass/nodules Lymph nodes: Cervical, supraclavicular, and axillary nodes normal. Resp: clear to auscultation bilaterally Back: symmetric, no curvature. ROM normal. No CVA tenderness. Cardio: regular rate and rhythm, S1, S2 normal, no murmur, click, rub or gallop GI: soft, non-tender; bowel sounds normal; no masses,  no organomegaly Extremities: extremities normal, atraumatic, no cyanosis or edema Neurologic: Alert and oriented X 3, normal strength and tone. Normal symmetric reflexes. Normal coordination and gait  ECOG PERFORMANCE STATUS: 1 - Symptomatic but completely ambulatory  Blood pressure 108/66, pulse 68, temperature 98.1 F (36.7 C), temperature source Oral, resp. rate 18, height 6\' 1"  (1.854 m), weight 242 lb 6.4 oz (110 kg), SpO2 100 %.  LABORATORY DATA: Lab Results  Component Value Date   WBC 4.7 11/25/2018   HGB 12.8 (L) 11/25/2018   HCT 39.1 11/25/2018   MCV 94.0 11/25/2018   PLT 147 (L) 11/25/2018      Chemistry      Component Value Date/Time   NA 143 11/04/2018 1348   NA 144  04/09/2018 1507   K 4.1 11/04/2018 1348   CL 108 11/04/2018 1348   CO2 25 11/04/2018 1348   BUN 16 11/04/2018 1348   BUN 15 04/09/2018 1507   CREATININE 1.12 11/04/2018 1348      Component Value Date/Time   CALCIUM 9.1 11/04/2018 1348   ALKPHOS 57 11/04/2018 1348   AST 17 11/04/2018 1348   ALT 13 11/04/2018 1348   BILITOT 0.6 11/04/2018 1348       RADIOGRAPHIC STUDIES: Dg Thoracic Spine 2 View  Result Date: 11/07/2018 CLINICAL DATA:  Thoracic back pain without known injury. EXAM: THORACIC SPINE 2 VIEWS COMPARISON:  CT scan of September 18, 1998. FINDINGS: There is no evidence of thoracic spine fracture. Alignment is normal. No other significant bone abnormalities are identified. IMPRESSION: Negative.  Electronically Signed   By: Marijo Conception, M.D.   On: 11/07/2018 09:56   Dg Lumbar Spine 2-3 Views  Result Date: 11/07/2018 CLINICAL DATA:  Low back pain without known injury. EXAM: LUMBAR SPINE - 2-3 VIEW COMPARISON:  CT scan of September 18, 2018. FINDINGS: No fracture or spondylolisthesis is noted. Mild degenerative disc disease is noted at L2-3. Moderate degenerative disc disease is noted at L5-S1. IMPRESSION: Multilevel degenerative disc disease. No acute abnormality seen in the lumbar spine. Electronically Signed   By: Marijo Conception, M.D.   On: 11/07/2018 09:54   Ct Chest W Contrast  Result Date: 11/24/2018 CLINICAL DATA:  Lung cancer, radiation therapy to right lung and left lymph node, chemotherapy completed, ongoing immunotherapy. Shortness of breath on exertion, right chest tingling. EXAM: CT CHEST WITH CONTRAST TECHNIQUE: Multidetector CT imaging of the chest was performed during intravenous contrast administration. CONTRAST:  45mL OMNIPAQUE IOHEXOL 300 MG/ML  SOLN COMPARISON:  09/18/2018. FINDINGS: Cardiovascular: Right IJ Port-A-Cath terminates in the high right atrium. Atherosclerotic calcification of the aorta, aortic valve and coronary arteries. Heart size normal. No pericardial effusion. Mediastinum/Nodes: No pathologically enlarged mediastinal, left hilar or right axillary lymph nodes. High left axillary lymph node measures 1.7 x 1.9 cm, stable. Right hilum is difficult to evaluate due to post treatment changes. Esophagus is grossly unremarkable. Lungs/Pleura: Post radiation parenchymal retraction, architectural distortion and bronchiectasis in the perihilar right lung. Findings appears slightly more organized in the right lower lobe. Previously measured nodular component in the right retro hilar region is not readily appreciated. Anterior left upper lobe nodule measures 2.2 cm (series 5, image 75), stable. No pleural fluid. Airway is otherwise unremarkable. Upper Abdomen: Low-attenuation  lesion in the left hepatic lobe measures 1.7 cm and is likely a cyst. Visualized portions of the liver, gallbladder and adrenal glands are unremarkable. Stones are seen in the kidneys. Cortical scarring bilaterally. Subcentimeter low-attenuation lesion in the upper pole left kidney is too small to characterize but statistically, a cyst is most likely. Visualized portions of the spleen, pancreas, stomach and bowel are unremarkable. No upper abdominal adenopathy. Musculoskeletal: Degenerative changes in the spine. No worrisome lytic or sclerotic lesions. IMPRESSION: 1. Continued evolution of radiation therapy in the medial right hemithorax. Previously measured nodular component in the right lower lobe is no longer readily apparent. 2. Left upper lobe nodule, stable. 3. Bilateral renal stones. 4. Aortic atherosclerosis (ICD10-170.0). Coronary artery calcification. Electronically Signed   By: Lorin Picket M.D.   On: 11/24/2018 12:39    ASSESSMENT AND PLAN: This is a very pleasant 73 years old white male with very light most smoking history recently diagnosed with stage IV (T3, N0, M1c) non-small  cell lung cancer, squamous cell carcinoma based on the biopsy from the left axillary mass. He status post 4 cycles of induction systemic chemotherapy with carboplatin, paclitaxel and Keytruda with partial response.  The patient is currently on maintenance treatment with single agent Keytruda status post 6 cycles. The patient continues to tolerate this treatment well with no concerning complaints. He had repeat CT scan of the chest performed recently.  I personally and independently reviewed the scans and discussed the results with the patient and his wife. His scan showed no concerning findings for disease progression. I recommended for the patient to continue his current treatment with [facility] and he will proceed with cycle #7 today. I will see the patient back for follow-up visit in 3 weeks for evaluation before  the next cycle of his treatment. For the hypothyroidism we will continue to monitor his TSH closely and adjust his dose of levothyroxine accordingly. The patient was advised to call immediately if he has any concerning symptoms in the interval. The patient voices understanding of current disease status and treatment options and is in agreement with the current care plan. All questions were answered. The patient knows to call the clinic with any problems, questions or concerns. We can certainly see the patient much sooner if necessary.  Disclaimer: This note was dictated with voice recognition software. Similar sounding words can inadvertently be transcribed and may not be corrected upon review.

## 2018-11-26 NOTE — Progress Notes (Signed)
The patient is a 73 year old male with a history of a stage IV non-small cell lung cancer, squamous cell carcinoma who was seen in the infusion room today as he was receiving Keytruda.  He reported having a diffuse rash over his chest and abdomen.  The patient had been seen by Dr. Julien Nordmann earlier today and did not mention this.  The patient's rash was noted to be a diffuse erythematous rash over the abdomen and chest.  This was consistent with a rash secondary to [facility].  The patient was given dosed with Pepcid 20 mg IV x1 and was given a prescription for Atarax and triamcinolone cream.  Sandi Mealy, MHS, PA-C Physician Assistant

## 2018-11-27 ENCOUNTER — Telehealth: Payer: Self-pay | Admitting: *Deleted

## 2018-11-27 ENCOUNTER — Other Ambulatory Visit: Payer: Self-pay | Admitting: Medical

## 2018-11-27 MED ORDER — METHYLPREDNISOLONE 4 MG PO TBPK: ORAL_TABLET | ORAL | 0 refills | Status: DC

## 2018-11-27 NOTE — Telephone Encounter (Signed)
FYI "Travis Padilla [phone]).  Chemotherapy infusion Tuesday, at end rash.  Given pepcid in IV, a medication and a salve.  Using salve but now entire body red, itchy rash.  My legs are red but rash brown as it goes up my legs.  No open skin.  Is this a new diagnosis problem or does it go along with treatment?"

## 2018-11-27 NOTE — Telephone Encounter (Signed)
A prescription for a Medrol dose pack was sent to the patient" pharmacy after speaking with Dr. Julien Nordmann. Sandi Mealy, MHS, PA-C

## 2018-12-01 ENCOUNTER — Other Ambulatory Visit: Payer: Self-pay | Admitting: Medical Oncology

## 2018-12-01 ENCOUNTER — Telehealth: Payer: Self-pay | Admitting: *Deleted

## 2018-12-01 DIAGNOSIS — C3491 Malignant neoplasm of unspecified part of right bronchus or lung: Secondary | ICD-10-CM

## 2018-12-01 NOTE — Telephone Encounter (Signed)
[place] Work  Clinical Social Work contacted patient by phone to assess for access to food and "check in".  CSW requested patient return call.  Gwinda Maine, LCSW  Clinical Social Worker [facility]

## 2018-12-15 ENCOUNTER — Other Ambulatory Visit: Payer: Self-pay | Admitting: Medical Oncology

## 2018-12-15 DIAGNOSIS — C3491 Malignant neoplasm of unspecified part of right bronchus or lung: Secondary | ICD-10-CM

## 2018-12-16 ENCOUNTER — Inpatient Hospital Stay: Payer: Medicare Other

## 2018-12-16 ENCOUNTER — Encounter: Payer: Self-pay | Admitting: Internal Medicine

## 2018-12-16 ENCOUNTER — Inpatient Hospital Stay (HOSPITAL_BASED_OUTPATIENT_CLINIC_OR_DEPARTMENT_OTHER): Payer: Medicare Other | Admitting: Internal Medicine

## 2018-12-16 ENCOUNTER — Other Ambulatory Visit: Payer: Self-pay

## 2018-12-16 ENCOUNTER — Inpatient Hospital Stay: Payer: Medicare Other | Attending: Oncology

## 2018-12-16 VITALS — BP 122/74 | HR 66 | Temp 98.3°F | Resp 18 | Ht 73.0 in | Wt 238.7 lb

## 2018-12-16 DIAGNOSIS — Z5112 Encounter for antineoplastic immunotherapy: Secondary | ICD-10-CM | POA: Insufficient documentation

## 2018-12-16 DIAGNOSIS — T451X5S Adverse effect of antineoplastic and immunosuppressive drugs, sequela: Secondary | ICD-10-CM | POA: Insufficient documentation

## 2018-12-16 DIAGNOSIS — E785 Hyperlipidemia, unspecified: Secondary | ICD-10-CM | POA: Insufficient documentation

## 2018-12-16 DIAGNOSIS — E032 Hypothyroidism due to medicaments and other exogenous substances: Secondary | ICD-10-CM

## 2018-12-16 DIAGNOSIS — C3491 Malignant neoplasm of unspecified part of right bronchus or lung: Secondary | ICD-10-CM

## 2018-12-16 DIAGNOSIS — I10 Essential (primary) hypertension: Secondary | ICD-10-CM | POA: Insufficient documentation

## 2018-12-16 DIAGNOSIS — Z9221 Personal history of antineoplastic chemotherapy: Secondary | ICD-10-CM | POA: Insufficient documentation

## 2018-12-16 DIAGNOSIS — Z95828 Presence of other vascular implants and grafts: Secondary | ICD-10-CM

## 2018-12-16 DIAGNOSIS — Z79899 Other long term (current) drug therapy: Secondary | ICD-10-CM | POA: Diagnosis not present

## 2018-12-16 DIAGNOSIS — Z85828 Personal history of other malignant neoplasm of skin: Secondary | ICD-10-CM

## 2018-12-16 DIAGNOSIS — J029 Acute pharyngitis, unspecified: Secondary | ICD-10-CM | POA: Insufficient documentation

## 2018-12-16 DIAGNOSIS — I2510 Atherosclerotic heart disease of native coronary artery without angina pectoris: Secondary | ICD-10-CM | POA: Diagnosis not present

## 2018-12-16 DIAGNOSIS — Z923 Personal history of irradiation: Secondary | ICD-10-CM

## 2018-12-16 DIAGNOSIS — G4700 Insomnia, unspecified: Secondary | ICD-10-CM | POA: Insufficient documentation

## 2018-12-16 DIAGNOSIS — C7801 Secondary malignant neoplasm of right lung: Secondary | ICD-10-CM

## 2018-12-16 DIAGNOSIS — C73 Malignant neoplasm of thyroid gland: Secondary | ICD-10-CM | POA: Diagnosis not present

## 2018-12-16 DIAGNOSIS — C3412 Malignant neoplasm of upper lobe, left bronchus or lung: Secondary | ICD-10-CM | POA: Diagnosis not present

## 2018-12-16 DIAGNOSIS — G8929 Other chronic pain: Secondary | ICD-10-CM | POA: Insufficient documentation

## 2018-12-16 DIAGNOSIS — G620 Drug-induced polyneuropathy: Secondary | ICD-10-CM | POA: Diagnosis not present

## 2018-12-16 DIAGNOSIS — C773 Secondary and unspecified malignant neoplasm of axilla and upper limb lymph nodes: Secondary | ICD-10-CM | POA: Insufficient documentation

## 2018-12-16 DIAGNOSIS — R21 Rash and other nonspecific skin eruption: Secondary | ICD-10-CM | POA: Insufficient documentation

## 2018-12-16 DIAGNOSIS — M545 Low back pain: Secondary | ICD-10-CM | POA: Diagnosis not present

## 2018-12-16 LAB — CMP (CANCER CENTER ONLY)
ALT: 14 U/L (ref 0–44)
AST: 18 U/L (ref 15–41)
Albumin: 3.8 g/dL (ref 3.5–5.0)
Alkaline Phosphatase: 50 U/L (ref 38–126)
Anion gap: 8 (ref 5–15)
BUN: 14 mg/dL (ref 8–23)
CO2: 24 mmol/L (ref 22–32)
Calcium: 8.9 mg/dL (ref 8.9–10.3)
Chloride: 108 mmol/L (ref 98–111)
Creatinine: 1.02 mg/dL (ref 0.61–1.24)
GFR, Est AFR Am: >60 mL/min (ref >60)
GFR, Estimated: >60 mL/min (ref >60)
Glucose, Bld: 96 mg/dL (ref 70–99)
Potassium: 4.1 mmol/L (ref 3.5–5.1)
Sodium: 140 mmol/L (ref 135–145)
Total Bilirubin: 0.7 mg/dL (ref 0.3–1.2)
Total Protein: 7.5 g/dL (ref 6.5–8.1)

## 2018-12-16 LAB — CBC WITH DIFFERENTIAL (CANCER CENTER ONLY)
Abs Immature Granulocytes: 0.02 10*3/uL (ref 0.00–0.07)
Basophils Absolute: 0 10*3/uL (ref 0.0–0.1)
Basophils Relative: 0 %
Eosinophils Absolute: 0.2 10*3/uL (ref 0.0–0.5)
Eosinophils Relative: 5 %
HCT: 36.7 % — ABNORMAL LOW (ref 39.0–52.0)
Hemoglobin: 12.3 g/dL — ABNORMAL LOW (ref 13.0–17.0)
Immature Granulocytes: 0 %
Lymphocytes Relative: 20 %
Lymphs Abs: 0.9 10*3/uL (ref 0.7–4.0)
MCH: 30.3 pg (ref 26.0–34.0)
MCHC: 33.5 g/dL (ref 30.0–36.0)
MCV: 90.4 fL (ref 80.0–100.0)
Monocytes Absolute: 0.7 10*3/uL (ref 0.1–1.0)
Monocytes Relative: 15 %
Neutro Abs: 2.8 10*3/uL (ref 1.7–7.7)
Neutrophils Relative %: 60 %
Platelet Count: 144 10*3/uL — ABNORMAL LOW (ref 150–400)
RBC: 4.06 MIL/uL — ABNORMAL LOW (ref 4.22–5.81)
RDW: 12.3 % (ref 11.5–15.5)
WBC Count: 4.7 10*3/uL (ref 4.0–10.5)
nRBC: 0 % (ref 0.0–0.2)

## 2018-12-16 MED ORDER — HEPARIN SOD (PORK) LOCK FLUSH 100 UNIT/ML IV SOLN
500.0000 [IU] | Freq: Once | INTRAVENOUS | Status: AC | PRN
  Administered 2018-12-16: 500 [IU]
  Filled 2018-12-16: qty 5

## 2018-12-16 MED ORDER — SODIUM CHLORIDE 0.9 % IV SOLN
Freq: Once | INTRAVENOUS | Status: AC
  Administered 2018-12-16: 11:00:00 via INTRAVENOUS
  Filled 2018-12-16: qty 250

## 2018-12-16 MED ORDER — SODIUM CHLORIDE 0.9% FLUSH
10.0000 mL | Freq: Once | INTRAVENOUS | Status: AC
  Administered 2018-12-16: 10 mL
  Filled 2018-12-16: qty 10

## 2018-12-16 MED ORDER — SODIUM CHLORIDE 0.9% FLUSH
10.0000 mL | INTRAVENOUS | Status: DC | PRN
  Administered 2018-12-16: 10 mL
  Filled 2018-12-16: qty 10

## 2018-12-16 MED ORDER — SODIUM CHLORIDE 0.9 % IV SOLN
200.0000 mg | Freq: Once | INTRAVENOUS | Status: AC
  Administered 2018-12-16: 200 mg via INTRAVENOUS
  Filled 2018-12-16: qty 8

## 2018-12-16 NOTE — Progress Notes (Signed)
[facility] [phone]   [phone]) [phone]  OFFICE PROGRESS NOTE  [name], [place] [address]  DIAGNOSIS: Stage IV (T3, N0, M1c) non-small cell lung cancer, squamous cell carcinoma presented with large right infrahilar mass in addition to left upper lobe lung nodule as well as left axillary mass with left axillary lymph node diagnosed in August 2019.  PRIOR THERAPY:  1) Palliative radiotherapy to the right infrahilar mass as well as the axillary mass under the care of Dr. Lisbeth Renshaw. 2) Systemic chemotherapy with carboplatin for AUC of 5, paclitaxel 175 mg/M2 and Keytruda 200 mg IV every 3 weeks status post 4 cycles.  CURRENT THERAPY: Maintenance immunotherapy with single agent Keytruda 200 mg IV every 3 weeks status post 7 cycles.  INTERVAL HISTORY: Travis Padilla 73 y.o. male returns to the clinic today for follow-up visit.  The patient is feeling fine today with no concerning complaints.  He developed a skin rash after the last cycle of treatment with Beryle Flock that have been immediately after the treatment and continued for few days.  He was treated with Medrol Dosepak and Benadryl at that time with complete resolution of the rash.  He denied having any current chest pain, shortness of breath, cough or hemoptysis.  He has mild sore throat.  He denied having any fever or chills.  He has no headache or visual changes.  The patient is here today for evaluation before starting cycle #8 of his maintenance treatment with Keytruda.  MEDICAL HISTORY: Past Medical History:  Diagnosis Date  . Abnormal nuclear stress test    December, 2013  . Anemia   . CAD (coronary artery disease)    Mild nonobstructive plaque in cath 2013  . Chest pain    December, 2013  . Dizziness   . Gout   . Hemorrhoid   . Hyperlipidemia   . Hypertension   . Skin cancer   . Spinal stenosis     ALLERGIES:  is allergic to trazodone and nefazodone.   MEDICATIONS:  Current Outpatient Medications  Medication Sig Dispense Refill  . albuterol (PROVENTIL) (2.5 MG/3ML) 0.083% nebulizer solution Take 3 mLs (2.5 mg total) by nebulization every 6 (six) hours as needed for wheezing or shortness of breath. (Patient not taking: Reported on 08/11/2018) 75 mL 12  . ALPRAZolam (XANAX) 0.25 MG tablet Take 1 tablet (0.25 mg total) by mouth at bedtime as needed for anxiety. 30 tablet 0  . Artificial Tear Solution (SOOTHE XP OP) Place 1 drop into both eyes 2 (two) times daily.    . carvedilol (COREG) 12.5 MG tablet TAKE 1 AND 1/2 TABLETS BY MOUTH TWO TIMES DAILY WITH A MEAL (Patient taking differently: Take 18.75 mg by mouth 2 (two) times daily with a meal. ) 270 tablet 3  . diphenhydramine-acetaminophen (TYLENOL PM) 25-500 MG TABS tablet Take 1-2 tablets by mouth at bedtime as needed (pain).    Marland Kitchen gabapentin (NEURONTIN) 100 MG capsule TAKE 1 CAPSULE BY MOUTH THREE TIMES A DAY (Patient not taking: Reported on 11/04/2018) 270 capsule 1  . Glucosamine-Chondroitin (OSTEO BI-FLEX REGULAR STRENGTH PO) Take 1 tablet by mouth daily.    . hydrocortisone (ANUSOL-HC) 2.5 % rectal cream Place 1 application rectally 4 (four) times daily. (Patient taking differently: Place 1 application rectally 4 (four) times daily as needed for hemorrhoids. ) 30 g 3  . hydrOXYzine (ATARAX/VISTARIL) 25 MG tablet Take 1 tablet (25 mg total) by mouth  3 (three) times daily. 30 tablet 2  . levothyroxine (SYNTHROID, LEVOTHROID) 125 MCG tablet Take 1 tablet (125 mcg total) by mouth daily before breakfast. 30 tablet 1  . lidocaine-prilocaine (EMLA) cream Apply 1 application topically as needed. (Patient taking differently: Apply 1 application topically daily as needed (port). ) 30 g 0  . methylPREDNISolone (MEDROL DOSEPAK) 4 MG TBPK tablet 6 x 1 day, 5 x 1 day, 4 x 1 day, 3 x 1 day, 2 x 1 day, 1 x 1 day 21 tablet 0  . mirtazapine (REMERON) 15 MG tablet Take 1 tablet (15 mg total) by mouth at bedtime.  (Patient taking differently: Take 15 mg by mouth at bedtime as needed (sleep). ) 30 tablet 5  . Multiple Vitamin (MULTIVITAMIN) tablet Take 1 tablet by mouth daily.    . Multiple Vitamins-Minerals (PRESERVISION AREDS 2 PO) Take by mouth.    . oxyCODONE-acetaminophen (PERCOCET/ROXICET) 5-325 MG tablet Take 1 tablet by mouth every 8 (eight) hours as needed for severe pain. (Patient not taking: Reported on 11/04/2018) 30 tablet 0  . pravastatin (PRAVACHOL) 40 MG tablet TAKE 1 TABLET BY MOUTH EVERY DAY (Patient taking differently: Take 40 mg by mouth at bedtime. ) 90 tablet 3  . prochlorperazine (COMPAZINE) 10 MG tablet Take 1 tablet (10 mg total) by mouth every 6 (six) hours as needed for nausea or vomiting. (Patient not taking: Reported on 09/01/2018) 30 tablet 1  . senna-docusate (SENNA S) 8.6-50 MG tablet 1 to 2 twice daily for constipation (Patient taking differently: Take 1 tablet by mouth 2 (two) times daily. ) 120 tablet 5  . triamcinolone lotion (KENALOG) 0.1 % Apply 1 application topically 3 (three) times daily. 60 mL 2   No current facility-administered medications for this visit.     SURGICAL HISTORY:  Past Surgical History:  Procedure Laterality Date  . 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 BELPHAROPTOSIS REPAIR Bilateral   . CATARACT EXTRACTION W/ INTRAOCULAR LENS  IMPLANT, BILATERAL    . COLONOSCOPY N/A 11/03/2014   Procedure: COLONOSCOPY;  Surgeon: Rogene Houston, MD;  Location: [facility];  Service: Endoscopy;  Laterality: N/A;  1225  . IR IMAGING GUIDED PORT INSERTION  07/11/2018  . KNEE CARTILAGE SURGERY Left    Left knee  . SKIN CANCER EXCISION  12/2014, 04/25/15  . VIDEO BRONCHOSCOPY Bilateral 05/09/2018   Procedure: VIDEO BRONCHOSCOPY WITH FLUORO;  Surgeon: Juanito Doom, MD;  Location: [facility];  Service: Cardiopulmonary;  Laterality: Bilateral;    REVIEW OF SYSTEMS:  A comprehensive review of systems was negative except for: Ears, nose, mouth, throat, and face:  positive for sore throat   PHYSICAL EXAMINATION: General appearance: alert, cooperative and no distress Head: Normocephalic, without obvious abnormality, atraumatic Neck: no adenopathy, no JVD, supple, symmetrical, trachea midline and thyroid not enlarged, symmetric, no tenderness/mass/nodules Lymph nodes: Cervical, supraclavicular, and axillary nodes normal. Resp: clear to auscultation bilaterally Back: symmetric, no curvature. ROM normal. No CVA tenderness. Cardio: regular rate and rhythm, S1, S2 normal, no murmur, click, rub or gallop GI: soft, non-tender; bowel sounds normal; no masses,  no organomegaly Extremities: extremities normal, atraumatic, no cyanosis or edema  ECOG PERFORMANCE STATUS: 1 - Symptomatic but completely ambulatory  Blood pressure 122/74, pulse 66, temperature 98.3 F (36.8 C), temperature source Oral, resp. rate 18, height 6\' 1"  (1.854 m), weight 238 lb 11.2 oz (108.3 kg), SpO2 100 %.  LABORATORY DATA: Lab Results  Component Value Date   WBC 4.7  11/25/2018   HGB 12.8 (L) 11/25/2018   HCT 39.1 11/25/2018   MCV 94.0 11/25/2018   PLT 147 (L) 11/25/2018      Chemistry      Component Value Date/Time   NA 146 (H) 11/25/2018 1122   NA 144 04/09/2018 1507   K 4.6 11/25/2018 1122   CL 111 11/25/2018 1122   CO2 23 11/25/2018 1122   BUN 16 11/25/2018 1122   BUN 15 04/09/2018 1507   CREATININE 1.04 11/25/2018 1122      Component Value Date/Time   CALCIUM 9.0 11/25/2018 1122   ALKPHOS 56 11/25/2018 1122   AST 15 11/25/2018 1122   ALT 13 11/25/2018 1122   BILITOT 0.5 11/25/2018 1122       RADIOGRAPHIC STUDIES: Ct Chest W Contrast  Result Date: 11/24/2018 CLINICAL DATA:  Lung cancer, radiation therapy to right lung and left lymph node, chemotherapy completed, ongoing immunotherapy. Shortness of breath on exertion, right chest tingling. EXAM: CT CHEST WITH CONTRAST TECHNIQUE: Multidetector CT imaging of the chest was performed during intravenous contrast  administration. CONTRAST:  26mL OMNIPAQUE IOHEXOL 300 MG/ML  SOLN COMPARISON:  09/18/2018. FINDINGS: Cardiovascular: Right IJ Port-A-Cath terminates in the high right atrium. Atherosclerotic calcification of the aorta, aortic valve and coronary arteries. Heart size normal. No pericardial effusion. Mediastinum/Nodes: No pathologically enlarged mediastinal, left hilar or right axillary lymph nodes. High left axillary lymph node measures 1.7 x 1.9 cm, stable. Right hilum is difficult to evaluate due to post treatment changes. Esophagus is grossly unremarkable. Lungs/Pleura: Post radiation parenchymal retraction, architectural distortion and bronchiectasis in the perihilar right lung. Findings appears slightly more organized in the right lower lobe. Previously measured nodular component in the right retro hilar region is not readily appreciated. Anterior left upper lobe nodule measures 2.2 cm (series 5, image 75), stable. No pleural fluid. Airway is otherwise unremarkable. Upper Abdomen: Low-attenuation lesion in the left hepatic lobe measures 1.7 cm and is likely a cyst. Visualized portions of the liver, gallbladder and adrenal glands are unremarkable. Stones are seen in the kidneys. Cortical scarring bilaterally. Subcentimeter low-attenuation lesion in the upper pole left kidney is too small to characterize but statistically, a cyst is most likely. Visualized portions of the spleen, pancreas, stomach and bowel are unremarkable. No upper abdominal adenopathy. Musculoskeletal: Degenerative changes in the spine. No worrisome lytic or sclerotic lesions. IMPRESSION: 1. Continued evolution of radiation therapy in the medial right hemithorax. Previously measured nodular component in the right lower lobe is no longer readily apparent. 2. Left upper lobe nodule, stable. 3. Bilateral renal stones. 4. Aortic atherosclerosis (ICD10-170.0). Coronary artery calcification. Electronically Signed   By: Lorin Picket M.D.   On:  11/24/2018 12:39    ASSESSMENT AND PLAN: This is a very pleasant 73 years old white male with very light most smoking history recently diagnosed with stage IV (T3, N0, M1c) non-small cell lung cancer, squamous cell carcinoma based on the biopsy from the left axillary mass. He status post 4 cycles of induction systemic chemotherapy with carboplatin, paclitaxel and Keytruda with partial response.  The patient is currently on maintenance treatment with single agent Keytruda status post 7 cycles. The patient continues to tolerate his treatment well except for the last cycle when he developed a skin rash immediately after the treatment. His skin rash is completely resolved at this point. We will proceed with cycle #8 of his treatment today but we will monitor him closely for any development of skin rash again. For the hypothyroidism  we will continue to monitor his TSH closely and adjust his dose of levothyroxine accordingly. The patient will come back for follow-up visit in 3 weeks for evaluation before the next cycle of his treatment. He was advised to call immediately if he has any concerning symptoms in the interval. The patient voices understanding of current disease status and treatment options and is in agreement with the current care plan. All questions were answered. The patient knows to call the clinic with any problems, questions or concerns. We can certainly see the patient much sooner if necessary.  Disclaimer: This note was dictated with voice recognition software. Similar sounding words can inadvertently be transcribed and may not be corrected upon review.

## 2018-12-16 NOTE — Patient Instructions (Signed)
Coronavirus (COVID-19) Are you at risk?  Are you at risk for the Coronavirus (COVID-19)?  To be considered HIGH RISK for Coronavirus (COVID-19), you have to meet the following criteria:  . Traveled to [place], [place], [place], [place] or [place]; or in the [place] to [place], [place], [place], or [place]; and have fever, cough, and shortness of breath within the last 2 weeks of travel OR . Been in close contact with a person diagnosed with COVID-19 within the last 2 weeks and have fever, cough, and shortness of breath . IF YOU DO NOT MEET THESE CRITERIA, YOU ARE CONSIDERED LOW RISK FOR COVID-19.  What to do if you are HIGH RISK for COVID-19?  . If you are having a medical emergency, call 911. . Seek medical care right away. Before you go to a doctor's office, urgent care or emergency department, call ahead and tell them about your recent travel, contact with someone diagnosed with COVID-19, and your symptoms. You should receive instructions from your physician's office regarding next steps of care.  . When you arrive at healthcare provider, tell the healthcare staff immediately you have returned from visiting [place], [place], [place], [place] or [place]; or traveled in the [place] to [place], [place], [place], or [place]; in the last two weeks or you have been in close contact with a person diagnosed with COVID-19 in the last 2 weeks.   . Tell the health care staff about your symptoms: fever, cough and shortness of breath. . After you have been seen by a medical provider, you will be either: o Tested for (COVID-19) and discharged home on quarantine except to seek medical care if symptoms worsen, and asked to  - Stay home and avoid contact with others until you get your results (4-5 days)  - Avoid travel on public transportation if possible (such as bus, train, or airplane) or o Sent to the Emergency Department by EMS for evaluation, COVID-19 testing, and possible  admission depending on your condition and test results.  What to do if you are LOW RISK for COVID-19?  Reduce your risk of any infection by using the same precautions used for avoiding the common cold or flu:  . Wash your hands often with soap and warm water for at least 20 seconds.  If soap and water are not readily available, use an alcohol-based hand sanitizer with at least 60% alcohol.  . If coughing or sneezing, cover your mouth and nose by coughing or sneezing into the elbow areas of your shirt or coat, into a tissue or into your sleeve (not your hands). . Avoid shaking hands with others and consider head nods or verbal greetings only. . Avoid touching your eyes, nose, or mouth with unwashed hands.  . Avoid close contact with people who are sick. . Avoid places or events with large numbers of people in one location, like concerts or sporting events. . Carefully consider travel plans you have or are making. . If you are planning any travel outside or inside the [place], visit the CDC's Travelers' Health webpage for the latest health notices. . If you have some symptoms but not all symptoms, continue to monitor at home and seek medical attention if your symptoms worsen. . If you are having a medical emergency, call 911.   ADDITIONAL HEALTHCARE OPTIONS FOR PATIENTS  [facility] / e-Visit: [email or web address]         [facility]: [phone]  Center   Urgent Care: [facility]: [facility] Discharge Instructions for Patients Receiving Chemotherapy  Today you received the following chemotherapy agents :  Keytruda.  To help prevent nausea and vomiting after your treatment, we encourage you to take your nausea medication as prescribed.   If you develop nausea and vomiting that is not controlled by your nausea medication, call the clinic.    BELOW ARE SYMPTOMS THAT SHOULD BE REPORTED IMMEDIATELY:  *FEVER GREATER THAN 100.5 F  *CHILLS WITH OR WITHOUT FEVER  NAUSEA AND VOMITING THAT IS NOT CONTROLLED WITH YOUR NAUSEA MEDICATION  *UNUSUAL SHORTNESS OF BREATH  *UNUSUAL BRUISING OR BLEEDING  TENDERNESS IN MOUTH AND THROAT WITH OR WITHOUT PRESENCE OF ULCERS  *URINARY PROBLEMS  *BOWEL PROBLEMS  UNUSUAL RASH Items with * indicate a potential emergency and should be followed up as soon as possible.  Feel free to call the clinic should you have any questions or concerns. The clinic phone number is [phone].  Please show the [facility] at check-in to the Emergency Department and triage nurse.

## 2019-01-04 ENCOUNTER — Other Ambulatory Visit: Payer: Self-pay | Admitting: Physician Assistant

## 2019-01-04 DIAGNOSIS — E032 Hypothyroidism due to medicaments and other exogenous substances: Secondary | ICD-10-CM

## 2019-01-04 DIAGNOSIS — C3491 Malignant neoplasm of unspecified part of right bronchus or lung: Secondary | ICD-10-CM

## 2019-01-06 ENCOUNTER — Inpatient Hospital Stay: Payer: Medicare Other

## 2019-01-06 ENCOUNTER — Other Ambulatory Visit: Payer: Self-pay

## 2019-01-06 ENCOUNTER — Inpatient Hospital Stay (HOSPITAL_BASED_OUTPATIENT_CLINIC_OR_DEPARTMENT_OTHER): Payer: Medicare Other | Admitting: Physician Assistant

## 2019-01-06 ENCOUNTER — Encounter: Payer: Self-pay | Admitting: Physician Assistant

## 2019-01-06 VITALS — BP 103/78 | HR 89 | Temp 98.4°F | Resp 18 | Ht 73.0 in | Wt 241.9 lb

## 2019-01-06 DIAGNOSIS — C3412 Malignant neoplasm of upper lobe, left bronchus or lung: Secondary | ICD-10-CM

## 2019-01-06 DIAGNOSIS — C3491 Malignant neoplasm of unspecified part of right bronchus or lung: Secondary | ICD-10-CM

## 2019-01-06 DIAGNOSIS — I2510 Atherosclerotic heart disease of native coronary artery without angina pectoris: Secondary | ICD-10-CM | POA: Diagnosis not present

## 2019-01-06 DIAGNOSIS — M545 Low back pain: Secondary | ICD-10-CM

## 2019-01-06 DIAGNOSIS — Z5112 Encounter for antineoplastic immunotherapy: Secondary | ICD-10-CM | POA: Diagnosis not present

## 2019-01-06 DIAGNOSIS — E032 Hypothyroidism due to medicaments and other exogenous substances: Secondary | ICD-10-CM

## 2019-01-06 DIAGNOSIS — Z923 Personal history of irradiation: Secondary | ICD-10-CM

## 2019-01-06 DIAGNOSIS — R21 Rash and other nonspecific skin eruption: Secondary | ICD-10-CM | POA: Diagnosis not present

## 2019-01-06 DIAGNOSIS — G8929 Other chronic pain: Secondary | ICD-10-CM

## 2019-01-06 DIAGNOSIS — T451X5S Adverse effect of antineoplastic and immunosuppressive drugs, sequela: Secondary | ICD-10-CM

## 2019-01-06 DIAGNOSIS — Z95828 Presence of other vascular implants and grafts: Secondary | ICD-10-CM

## 2019-01-06 DIAGNOSIS — Z85828 Personal history of other malignant neoplasm of skin: Secondary | ICD-10-CM

## 2019-01-06 DIAGNOSIS — Z9221 Personal history of antineoplastic chemotherapy: Secondary | ICD-10-CM | POA: Diagnosis not present

## 2019-01-06 DIAGNOSIS — C773 Secondary and unspecified malignant neoplasm of axilla and upper limb lymph nodes: Secondary | ICD-10-CM

## 2019-01-06 DIAGNOSIS — C7801 Secondary malignant neoplasm of right lung: Secondary | ICD-10-CM

## 2019-01-06 DIAGNOSIS — G620 Drug-induced polyneuropathy: Secondary | ICD-10-CM | POA: Diagnosis not present

## 2019-01-06 DIAGNOSIS — G4700 Insomnia, unspecified: Secondary | ICD-10-CM

## 2019-01-06 DIAGNOSIS — I10 Essential (primary) hypertension: Secondary | ICD-10-CM

## 2019-01-06 DIAGNOSIS — Z79899 Other long term (current) drug therapy: Secondary | ICD-10-CM

## 2019-01-06 DIAGNOSIS — E785 Hyperlipidemia, unspecified: Secondary | ICD-10-CM

## 2019-01-06 LAB — CMP (CANCER CENTER ONLY)
ALT: 12 U/L (ref 0–44)
AST: 17 U/L (ref 15–41)
Albumin: 3.9 g/dL (ref 3.5–5.0)
Alkaline Phosphatase: 58 U/L (ref 38–126)
Anion gap: 8 (ref 5–15)
BUN: 14 mg/dL (ref 8–23)
CO2: 24 mmol/L (ref 22–32)
Calcium: 8.9 mg/dL (ref 8.9–10.3)
Chloride: 109 mmol/L (ref 98–111)
Creatinine: 1.01 mg/dL (ref 0.61–1.24)
GFR, Est AFR Am: >60 mL/min (ref >60)
GFR, Estimated: >60 mL/min (ref >60)
Glucose, Bld: 88 mg/dL (ref 70–99)
Potassium: 4.6 mmol/L (ref 3.5–5.1)
Sodium: 141 mmol/L (ref 135–145)
Total Bilirubin: 0.4 mg/dL (ref 0.3–1.2)
Total Protein: 7.6 g/dL (ref 6.5–8.1)

## 2019-01-06 LAB — CBC WITH DIFFERENTIAL (CANCER CENTER ONLY)
Abs Immature Granulocytes: 0.01 10*3/uL (ref 0.00–0.07)
Basophils Absolute: 0 10*3/uL (ref 0.0–0.1)
Basophils Relative: 1 %
Eosinophils Absolute: 0.2 10*3/uL (ref 0.0–0.5)
Eosinophils Relative: 3 %
HCT: 37.6 % — ABNORMAL LOW (ref 39.0–52.0)
Hemoglobin: 12.6 g/dL — ABNORMAL LOW (ref 13.0–17.0)
Immature Granulocytes: 0 %
Lymphocytes Relative: 20 %
Lymphs Abs: 1.1 10*3/uL (ref 0.7–4.0)
MCH: 30.4 pg (ref 26.0–34.0)
MCHC: 33.5 g/dL (ref 30.0–36.0)
MCV: 90.6 fL (ref 80.0–100.0)
Monocytes Absolute: 1 10*3/uL (ref 0.1–1.0)
Monocytes Relative: 19 %
Neutro Abs: 3.1 10*3/uL (ref 1.7–7.7)
Neutrophils Relative %: 57 %
Platelet Count: 151 10*3/uL (ref 150–400)
RBC: 4.15 MIL/uL — ABNORMAL LOW (ref 4.22–5.81)
RDW: 13 % (ref 11.5–15.5)
WBC Count: 5.5 10*3/uL (ref 4.0–10.5)
nRBC: 0 % (ref 0.0–0.2)

## 2019-01-06 LAB — TSH: TSH: 4.812 u[IU]/mL — ABNORMAL HIGH (ref 0.320–4.118)

## 2019-01-06 MED ORDER — HEPARIN SOD (PORK) LOCK FLUSH 100 UNIT/ML IV SOLN
500.0000 [IU] | Freq: Once | INTRAVENOUS | Status: AC | PRN
  Administered 2019-01-06: 500 [IU]
  Filled 2019-01-06: qty 5

## 2019-01-06 MED ORDER — SODIUM CHLORIDE 0.9% FLUSH
10.0000 mL | Freq: Once | INTRAVENOUS | Status: AC
  Administered 2019-01-06: 10 mL
  Filled 2019-01-06: qty 10

## 2019-01-06 MED ORDER — SODIUM CHLORIDE 0.9% FLUSH
10.0000 mL | INTRAVENOUS | Status: DC | PRN
  Administered 2019-01-06: 14:00:00 10 mL
  Filled 2019-01-06: qty 10

## 2019-01-06 MED ORDER — SODIUM CHLORIDE 0.9 % IV SOLN
200.0000 mg | Freq: Once | INTRAVENOUS | Status: AC
  Administered 2019-01-06: 200 mg via INTRAVENOUS
  Filled 2019-01-06: qty 8

## 2019-01-06 MED ORDER — SODIUM CHLORIDE 0.9 % IV SOLN
Freq: Once | INTRAVENOUS | Status: AC
  Administered 2019-01-06: 13:00:00 via INTRAVENOUS
  Filled 2019-01-06: qty 250

## 2019-01-06 NOTE — Progress Notes (Signed)
[facility] OFFICE PROGRESS NOTE  [name], [place] [address]  DIAGNOSIS: Stage IV (T3, N0, M1c) non-small cell lung cancer, squamous cell carcinoma presented with large right infrahilar mass in addition to left upper lobe lung nodule as well as left axillary mass with left axillary lymph node diagnosed in August 2019.  PRIOR THERAPY: 1) Palliative radiotherapy to the right infrahilar mass as well as the axillary mass under the care of Dr. Lisbeth Renshaw. 2) Systemic chemotherapy with carboplatin for AUC of 5, paclitaxel 175 mg/M2 and Keytruda 200 mg IV every 3 weeks status post 4 cycles.  CURRENT THERAPY: Maintenance immunotherapy with single agent Keytruda 200 mg IV every 3 weeks status post 8 cycles.  INTERVAL HISTORY: Travis Padilla 73 y.o. male returns to the clinic for follow-up a visit.  The patient is feeling fair today without any concerning complaints except for continued numbness/tingling/weakness in his hands and feet which developed after treatment with chemotherapy. He was previously prescribed Neurontin; however, he states that it "does not agree with him" and makes him feel anxious, so he has not taken it. Otherwise, he has tolerated treatment fairly well. After cycle #7, he developed a skin rash after treatment that resolved with a Medrol Dosepak and Benadryl. He did not develop a rash after cycle #8. He denies any fever, chills, night sweats, or weight loss.  He denies any chest pain, shortness of breath, cough, or hemoptysis.  He denies any nausea, vomiting, diarrhea, or constipation.  He denies any headache or visual changes. He is here today for evaluation before starting cycle #9 of maintenance immunotherapy with Keytruda.  MEDICAL HISTORY: Past Medical History:  Diagnosis Date  . Abnormal nuclear stress test    December, 2013  . Anemia   . CAD (coronary artery disease)    Mild nonobstructive plaque in cath 2013  . Chest pain     December, 2013  . Dizziness   . Gout   . Hemorrhoid   . Hyperlipidemia   . Hypertension   . Skin cancer   . Spinal stenosis     ALLERGIES:  is allergic to trazodone and nefazodone.  MEDICATIONS:  Current Outpatient Medications  Medication Sig Dispense Refill  . ALPRAZolam (XANAX) 0.25 MG tablet Take 1 tablet (0.25 mg total) by mouth at bedtime as needed for anxiety. 30 tablet 0  . Artificial Tear Solution (SOOTHE XP OP) Place 1 drop into both eyes 2 (two) times daily.    . carvedilol (COREG) 12.5 MG tablet TAKE 1 AND 1/2 TABLETS BY MOUTH TWO TIMES DAILY WITH A MEAL (Patient taking differently: Take 18.75 mg by mouth 2 (two) times daily with a meal. ) 270 tablet 3  . diphenhydramine-acetaminophen (TYLENOL PM) 25-500 MG TABS tablet Take 1-2 tablets by mouth at bedtime as needed (pain).    Marland Kitchen levothyroxine (SYNTHROID, LEVOTHROID) 125 MCG tablet Take 1 tablet (125 mcg total) by mouth daily before breakfast. 30 tablet 1  . lidocaine-prilocaine (EMLA) cream Apply 1 application topically as needed. (Patient taking differently: Apply 1 application topically daily as needed (port). ) 30 g 0  . mirtazapine (REMERON) 15 MG tablet Take 1 tablet (15 mg total) by mouth at bedtime. (Patient taking differently: Take 15 mg by mouth at bedtime as needed (sleep). ) 30 tablet 5  . Multiple Vitamins-Minerals (PRESERVISION AREDS 2 PO) Take by mouth.    . oxyCODONE-acetaminophen (PERCOCET/ROXICET) 5-325 MG tablet Take 1 tablet by mouth every 8 (eight)  hours as needed for severe pain. 30 tablet 0  . pravastatin (PRAVACHOL) 40 MG tablet TAKE 1 TABLET BY MOUTH EVERY DAY (Patient taking differently: Take 40 mg by mouth at bedtime. ) 90 tablet 3  . senna-docusate (SENNA S) 8.6-50 MG tablet 1 to 2 twice daily for constipation (Patient taking differently: Take 1 tablet by mouth 2 (two) times daily. ) 120 tablet 5  . albuterol (PROVENTIL) (2.5 MG/3ML) 0.083% nebulizer solution Take 3 mLs (2.5 mg total) by nebulization  every 6 (six) hours as needed for wheezing or shortness of breath. (Patient not taking: Reported on 08/11/2018) 75 mL 12  . gabapentin (NEURONTIN) 100 MG capsule TAKE 1 CAPSULE BY MOUTH THREE TIMES A DAY (Patient not taking: Reported on 11/04/2018) 270 capsule 1  . prochlorperazine (COMPAZINE) 10 MG tablet Take 1 tablet (10 mg total) by mouth every 6 (six) hours as needed for nausea or vomiting. (Patient not taking: Reported on 09/01/2018) 30 tablet 1   No current facility-administered medications for this visit.    Facility-Administered Medications Ordered in Other Visits  Medication Dose Route Frequency Provider Last Rate Last Dose  . heparin lock flush 100 unit/mL  500 Units Intracatheter Once PRN Curt Bears, MD      . pembrolizumab [facility]) 200 mg in sodium chloride 0.9 % 50 mL chemo infusion  200 mg Intravenous Once Curt Bears, MD      . sodium chloride flush (NS) 0.9 % injection 10 mL  10 mL Intracatheter PRN Curt Bears, MD        SURGICAL HISTORY:  Past Surgical History:  Procedure Laterality Date  . 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 BELPHAROPTOSIS REPAIR Bilateral   . CATARACT EXTRACTION W/ INTRAOCULAR LENS  IMPLANT, BILATERAL    . COLONOSCOPY N/A 11/03/2014   Procedure: COLONOSCOPY;  Surgeon: Rogene Houston, MD;  Location: [facility];  Service: Endoscopy;  Laterality: N/A;  1225  . IR IMAGING GUIDED PORT INSERTION  07/11/2018  . KNEE CARTILAGE SURGERY Left    Left knee  . SKIN CANCER EXCISION  12/2014, 04/25/15  . VIDEO BRONCHOSCOPY Bilateral 05/09/2018   Procedure: VIDEO BRONCHOSCOPY WITH FLUORO;  Surgeon: Juanito Doom, MD;  Location: [facility];  Service: Cardiopulmonary;  Laterality: Bilateral;    REVIEW OF SYSTEMS:   Review of Systems  Constitutional: Negative for appetite change, chills, fatigue, fever and unexpected weight change.  HENT:   Negative for mouth sores, nosebleeds, sore throat and trouble swallowing.   Eyes: Negative for eye  problems and icterus.  Respiratory: Negative for cough, hemoptysis, shortness of breath and wheezing.   Cardiovascular: Negative for chest pain and leg swelling.  Gastrointestinal: Negative for abdominal pain, constipation, diarrhea, nausea and vomiting.  Genitourinary: Negative for bladder incontinence, difficulty urinating, dysuria, frequency and hematuria.   Musculoskeletal: Positive for chronic low back pain. Negative for gait problem, neck pain and neck stiffness.  Skin: Negative for itching and rash.  Neurological: Positive for numbness and tingling in his hands and feet. Negative for dizziness, extremity weakness, gait problem, headaches, light-headedness and seizures.  Hematological: Negative for adenopathy. Does not bruise/bleed easily.  Psychiatric/Behavioral: Negative for confusion, depression and sleep disturbance. The patient is not nervous/anxious.     PHYSICAL EXAMINATION:  Blood pressure 103/78, pulse 89, temperature 98.4 F (36.9 C), temperature source Oral, resp. rate 18, height 6\' 1"  (1.854 m), weight 241 lb 14.4 oz (109.7 kg), SpO2 98 %.  ECOG PERFORMANCE STATUS: 1 - Symptomatic but completely  ambulatory  Physical Exam  Constitutional: Oriented to person, place, and time and well-developed, well-nourished, and in no distress.  HENT:  Head: Normocephalic and atraumatic.  Mouth/Throat: Oropharynx is clear and moist. No oropharyngeal exudate.  Eyes: Conjunctivae are normal. Right eye exhibits no discharge. Left eye exhibits no discharge. No scleral icterus.  Neck: Normal range of motion. Neck supple.  Cardiovascular: Normal rate, regular rhythm, normal heart sounds and intact distal pulses.   Pulmonary/Chest: Effort normal and breath sounds normal. No respiratory distress. No wheezes. No rales.  Abdominal: Soft. Bowel sounds are normal. Exhibits no distension and no mass. There is no tenderness.  Musculoskeletal: Normal range of motion. Exhibits no edema.   Lymphadenopathy:    No cervical adenopathy.  Neurological: Alert and oriented to person, place, and time. Exhibits normal muscle tone. Gait normal. Coordination normal.  Skin: Skin is warm and dry. No rash noted. Not diaphoretic. No erythema. No pallor.  Psychiatric: Mood, memory and judgment normal.  Vitals reviewed.  LABORATORY DATA: Lab Results  Component Value Date   WBC 5.5 01/06/2019   HGB 12.6 (L) 01/06/2019   HCT 37.6 (L) 01/06/2019   MCV 90.6 01/06/2019   PLT 151 01/06/2019      Chemistry      Component Value Date/Time   NA 141 01/06/2019 1124   NA 144 04/09/2018 1507   K 4.6 01/06/2019 1124   CL 109 01/06/2019 1124   CO2 24 01/06/2019 1124   BUN 14 01/06/2019 1124   BUN 15 04/09/2018 1507   CREATININE 1.01 01/06/2019 1124      Component Value Date/Time   CALCIUM 8.9 01/06/2019 1124   ALKPHOS 58 01/06/2019 1124   AST 17 01/06/2019 1124   ALT 12 01/06/2019 1124   BILITOT 0.4 01/06/2019 1124       RADIOGRAPHIC STUDIES:  No results found.   ASSESSMENT/PLAN:  This is a very pleasant 73 year old Caucasian male with a very light smoking history.  He was diagnosed with stage IV (T3, N0, M1C) non-small cell lung cancer, squamous cell carcinoma based on the biopsy of the left axillary mass.  He was diagnosed in August 2019.  He is status post 4 cycles of induction systemic chemotherapy with carboplatin, paclitaxel, and Keytruda with a partial response. The patient is currently undergoing maintenance immunotherapy with Keytruda 200 mg IV every 3 weeks.  He is status post 8 cycles.  He developed a skin rash after cycle #7 which resolved with a Medrol Dosepak and Benadryl.  He tolerated treatment 8 well without any skin rashes or itching.   The patient was seen with Dr. Julien Nordmann today. We will proceed with cycle #9 of maintenance therapy with [facility] as scheduled.  Labs were reviewed with the patient.  His synthroid dose was increased about 1 month ago. His TSH has  improved significantly since this time. We will continue to monitor his thyroid every 3 weeks while on Keytruda and will make adjustments to his synthroid if necessary.    I will arrange for a restaging CT scan to be performed prior to his next treatment.  We will see him back for a follow-up visit in 3 weeks for evaluation and to review his scan results prior to starting cycle #10.  We discussed alternative for neuropathy such as SNRIs; however, the patient is already taking an SSRI for insomnia (remeron). He will not proceed with any new medication at this time for neuropathy. The patient was advised to call immediately if he has any  concerning symptoms in the interval. The patient voices understanding of current disease status and treatment options and is in agreement with the current care plan. All questions were answered. The patient knows to call the clinic with any problems, questions or concerns. We can certainly see the patient much sooner if necessary  Orders Placed This Encounter  Procedures  . CT Chest W Contrast    Standing Status:   Future    Standing Expiration Date:   01/06/2020    Order Specific Question:   ** REASON FOR EXAM (FREE TEXT)    Answer:   Restaging Lung Cancer    Order Specific Question:   If indicated for the ordered procedure, I authorize the administration of contrast media per Radiology protocol    Answer:   Yes    Order Specific Question:   Preferred imaging location?    Answer:   [facility]    Order Specific Question:   Radiology Contrast Protocol - do NOT remove file path    Answer:   \\[email or web address]  . CT Abdomen Pelvis W Contrast    Standing Status:   Future    Standing Expiration Date:   01/06/2020    Order Specific Question:   ** REASON FOR EXAM (FREE TEXT)    Answer:   Restaging Lung Cancer    Order Specific Question:   If indicated for the ordered procedure, I authorize the administration of contrast media per  Radiology protocol    Answer:   Yes    Order Specific Question:   Preferred imaging location?    Answer:   [facility]    Order Specific Question:   Is Oral Contrast requested for this exam?    Answer:   Yes, Per Radiology protocol    Order Specific Question:   Radiology Contrast Protocol - do NOT remove file path    Answer:   \\[email or web address]     Edell Mesenbrink L Criselda Starke, PA-C 01/06/19  ADDENDUM: Hematology/Oncology Attending: I had a face-to-face encounter with the patient today.  I recommended his care plan.  This is a very pleasant 73 years old white male with metastatic non-small cell lung cancer, squamous cell carcinoma status post 4 cycles of systemic chemotherapy with carboplatin, paclitaxel and Keytruda and he is currently on maintenance treatment with single agent Keytruda status post 8 cycles. 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  He discontinued his previous treatment with gabapentin because he did not notice any improvement and has side effects from the treatment. I recommended for the patient to proceed with cycle #9 of his treatment today. He will come back for follow-up visit in 3 weeks for evaluation with repeat CT scan of the chest for restaging of his disease. The patient was advised to call immediately if he has any concerning symptoms in the interval.  Disclaimer: This note was dictated with voice recognition software. Similar sounding words can inadvertently be transcribed and may be missed upon review. Eilleen Kempf, MD 01/06/19

## 2019-01-06 NOTE — Patient Instructions (Signed)
[facility] Discharge Instructions for Patients Receiving Chemotherapy  Today you received the following chemotherapy agent: Pembrolizumab [facility]).  To help prevent nausea and vomiting after your treatment, we encourage you to take your nausea medication as prescribed.   If you develop nausea and vomiting that is not controlled by your nausea medication, call the clinic.   BELOW ARE SYMPTOMS THAT SHOULD BE REPORTED IMMEDIATELY:  *FEVER GREATER THAN 100.5 F  *CHILLS WITH OR WITHOUT FEVER  NAUSEA AND VOMITING THAT IS NOT CONTROLLED WITH YOUR NAUSEA MEDICATION  *UNUSUAL SHORTNESS OF BREATH  *UNUSUAL BRUISING OR BLEEDING  TENDERNESS IN MOUTH AND THROAT WITH OR WITHOUT PRESENCE OF ULCERS  *URINARY PROBLEMS  *BOWEL PROBLEMS  UNUSUAL RASH Items with * indicate a potential emergency and should be followed up as soon as possible.  Feel free to call the clinic should you have any questions or concerns. The clinic phone number is [phone].  Please show the [place] at check-in to the Emergency Department and triage nurse.

## 2019-01-10 ENCOUNTER — Other Ambulatory Visit: Payer: Self-pay | Admitting: Internal Medicine

## 2019-01-10 DIAGNOSIS — C3491 Malignant neoplasm of unspecified part of right bronchus or lung: Secondary | ICD-10-CM

## 2019-01-23 ENCOUNTER — Other Ambulatory Visit: Payer: Self-pay

## 2019-01-23 ENCOUNTER — Ambulatory Visit (HOSPITAL_COMMUNITY)
Admission: RE | Admit: 2019-01-23 | Discharge: 2019-01-23 | Disposition: A | Discharge status: Home / Self Care | Payer: Medicare Other | Source: Ambulatory Visit | Attending: Physician Assistant | Admitting: Physician Assistant

## 2019-01-23 ENCOUNTER — Encounter (HOSPITAL_COMMUNITY): Payer: Self-pay

## 2019-01-23 DIAGNOSIS — R911 Solitary pulmonary nodule: Secondary | ICD-10-CM | POA: Diagnosis not present

## 2019-01-23 DIAGNOSIS — K7689 Other specified diseases of liver: Secondary | ICD-10-CM | POA: Diagnosis not present

## 2019-01-23 DIAGNOSIS — C3491 Malignant neoplasm of unspecified part of right bronchus or lung: Secondary | ICD-10-CM | POA: Insufficient documentation

## 2019-01-23 MED ORDER — IOHEXOL 300 MG/ML  SOLN
100.0000 mL | Freq: Once | INTRAMUSCULAR | Status: AC | PRN
  Administered 2019-01-23: 11:00:00 100 mL via INTRAVENOUS

## 2019-01-23 MED ORDER — HEPARIN SOD (PORK) LOCK FLUSH 100 UNIT/ML IV SOLN
INTRAVENOUS | Status: AC
  Administered 2019-01-23: 11:00:00 500 [IU] via INTRAVENOUS
  Filled 2019-01-23: qty 5

## 2019-01-23 MED ORDER — HEPARIN SOD (PORK) LOCK FLUSH 100 UNIT/ML IV SOLN
500.0000 [IU] | Freq: Once | INTRAVENOUS | Status: AC
  Administered 2019-01-23: 11:00:00 500 [IU] via INTRAVENOUS

## 2019-01-23 MED ORDER — SODIUM CHLORIDE (PF) 0.9 % IJ SOLN
INTRAMUSCULAR | Status: AC
  Filled 2019-01-23: qty 50

## 2019-01-27 ENCOUNTER — Encounter: Payer: Self-pay | Admitting: Internal Medicine

## 2019-01-27 ENCOUNTER — Inpatient Hospital Stay: Payer: Medicare Other

## 2019-01-27 ENCOUNTER — Telehealth: Payer: Self-pay | Admitting: Internal Medicine

## 2019-01-27 ENCOUNTER — Encounter: Payer: Self-pay | Admitting: *Deleted

## 2019-01-27 ENCOUNTER — Inpatient Hospital Stay (HOSPITAL_BASED_OUTPATIENT_CLINIC_OR_DEPARTMENT_OTHER): Payer: Medicare Other | Admitting: Internal Medicine

## 2019-01-27 ENCOUNTER — Other Ambulatory Visit: Payer: Self-pay

## 2019-01-27 ENCOUNTER — Inpatient Hospital Stay: Payer: Medicare Other | Attending: Oncology

## 2019-01-27 VITALS — BP 122/81 | HR 79 | Temp 98.0°F | Resp 18 | Ht 73.0 in | Wt 238.9 lb

## 2019-01-27 DIAGNOSIS — Z923 Personal history of irradiation: Secondary | ICD-10-CM | POA: Insufficient documentation

## 2019-01-27 DIAGNOSIS — Z79899 Other long term (current) drug therapy: Secondary | ICD-10-CM | POA: Diagnosis not present

## 2019-01-27 DIAGNOSIS — I10 Essential (primary) hypertension: Secondary | ICD-10-CM | POA: Diagnosis not present

## 2019-01-27 DIAGNOSIS — M545 Low back pain: Secondary | ICD-10-CM

## 2019-01-27 DIAGNOSIS — Z95828 Presence of other vascular implants and grafts: Secondary | ICD-10-CM

## 2019-01-27 DIAGNOSIS — E785 Hyperlipidemia, unspecified: Secondary | ICD-10-CM

## 2019-01-27 DIAGNOSIS — Z9221 Personal history of antineoplastic chemotherapy: Secondary | ICD-10-CM | POA: Diagnosis not present

## 2019-01-27 DIAGNOSIS — C7801 Secondary malignant neoplasm of right lung: Secondary | ICD-10-CM

## 2019-01-27 DIAGNOSIS — Z87891 Personal history of nicotine dependence: Secondary | ICD-10-CM

## 2019-01-27 DIAGNOSIS — C3491 Malignant neoplasm of unspecified part of right bronchus or lung: Secondary | ICD-10-CM

## 2019-01-27 DIAGNOSIS — E032 Hypothyroidism due to medicaments and other exogenous substances: Secondary | ICD-10-CM | POA: Insufficient documentation

## 2019-01-27 DIAGNOSIS — C773 Secondary and unspecified malignant neoplasm of axilla and upper limb lymph nodes: Secondary | ICD-10-CM

## 2019-01-27 DIAGNOSIS — Z7951 Long term (current) use of inhaled steroids: Secondary | ICD-10-CM

## 2019-01-27 DIAGNOSIS — C3412 Malignant neoplasm of upper lobe, left bronchus or lung: Secondary | ICD-10-CM

## 2019-01-27 DIAGNOSIS — I2510 Atherosclerotic heart disease of native coronary artery without angina pectoris: Secondary | ICD-10-CM | POA: Diagnosis not present

## 2019-01-27 DIAGNOSIS — Z85828 Personal history of other malignant neoplasm of skin: Secondary | ICD-10-CM | POA: Insufficient documentation

## 2019-01-27 DIAGNOSIS — Z5112 Encounter for antineoplastic immunotherapy: Secondary | ICD-10-CM | POA: Insufficient documentation

## 2019-01-27 LAB — CMP (CANCER CENTER ONLY)
ALT: 12 U/L (ref 0–44)
AST: 15 U/L (ref 15–41)
Albumin: 3.8 g/dL (ref 3.5–5.0)
Alkaline Phosphatase: 50 U/L (ref 38–126)
Anion gap: 8 (ref 5–15)
BUN: 14 mg/dL (ref 8–23)
CO2: 26 mmol/L (ref 22–32)
Calcium: 9.1 mg/dL (ref 8.9–10.3)
Chloride: 109 mmol/L (ref 98–111)
Creatinine: 0.93 mg/dL (ref 0.61–1.24)
GFR, Est AFR Am: >60 mL/min (ref >60)
GFR, Estimated: >60 mL/min (ref >60)
Glucose, Bld: 89 mg/dL (ref 70–99)
Potassium: 4.4 mmol/L (ref 3.5–5.1)
Sodium: 143 mmol/L (ref 135–145)
Total Bilirubin: 0.6 mg/dL (ref 0.3–1.2)
Total Protein: 7.3 g/dL (ref 6.5–8.1)

## 2019-01-27 LAB — TSH: TSH: 1.262 u[IU]/mL (ref 0.320–4.118)

## 2019-01-27 LAB — CBC WITH DIFFERENTIAL (CANCER CENTER ONLY)
Abs Immature Granulocytes: 0.02 10*3/uL (ref 0.00–0.07)
Basophils Absolute: 0 10*3/uL (ref 0.0–0.1)
Basophils Relative: 1 %
Eosinophils Absolute: 0.3 10*3/uL (ref 0.0–0.5)
Eosinophils Relative: 5 %
HCT: 36.5 % — ABNORMAL LOW (ref 39.0–52.0)
Hemoglobin: 12.2 g/dL — ABNORMAL LOW (ref 13.0–17.0)
Immature Granulocytes: 0 %
Lymphocytes Relative: 17 %
Lymphs Abs: 0.9 10*3/uL (ref 0.7–4.0)
MCH: 30.2 pg (ref 26.0–34.0)
MCHC: 33.4 g/dL (ref 30.0–36.0)
MCV: 90.3 fL (ref 80.0–100.0)
Monocytes Absolute: 0.9 10*3/uL (ref 0.1–1.0)
Monocytes Relative: 16 %
Neutro Abs: 3.2 10*3/uL (ref 1.7–7.7)
Neutrophils Relative %: 61 %
Platelet Count: 147 10*3/uL — ABNORMAL LOW (ref 150–400)
RBC: 4.04 MIL/uL — ABNORMAL LOW (ref 4.22–5.81)
RDW: 12.7 % (ref 11.5–15.5)
WBC Count: 5.3 10*3/uL (ref 4.0–10.5)
nRBC: 0 % (ref 0.0–0.2)

## 2019-01-27 MED ORDER — SODIUM CHLORIDE 0.9 % IV SOLN
200.0000 mg | Freq: Once | INTRAVENOUS | Status: AC
  Administered 2019-01-27: 200 mg via INTRAVENOUS
  Filled 2019-01-27: qty 8

## 2019-01-27 MED ORDER — HEPARIN SOD (PORK) LOCK FLUSH 100 UNIT/ML IV SOLN
500.0000 [IU] | Freq: Once | INTRAVENOUS | Status: AC | PRN
  Administered 2019-01-27: 14:00:00 500 [IU]
  Filled 2019-01-27: qty 5

## 2019-01-27 MED ORDER — SODIUM CHLORIDE 0.9 % IV SOLN
Freq: Once | INTRAVENOUS | Status: AC
  Administered 2019-01-27: 13:00:00 via INTRAVENOUS
  Filled 2019-01-27: qty 250

## 2019-01-27 MED ORDER — OXYCODONE-ACETAMINOPHEN 5-325 MG PO TABS: 1.0000 | ORAL_TABLET | Freq: Three times a day (TID) | ORAL | 0 refills | Status: DC | PRN

## 2019-01-27 MED ORDER — SODIUM CHLORIDE 0.9% FLUSH
10.0000 mL | INTRAVENOUS | Status: DC | PRN
  Administered 2019-01-27: 14:00:00 10 mL
  Filled 2019-01-27: qty 10

## 2019-01-27 MED ORDER — SODIUM CHLORIDE 0.9% FLUSH
10.0000 mL | Freq: Once | INTRAVENOUS | Status: AC
  Administered 2019-01-27: 10 mL
  Filled 2019-01-27: qty 10

## 2019-01-27 NOTE — Research (Unsigned)
Dr. Julien Nordmann referred patient for the Lung Map "A MASTER PROTOCOL TO EVALUATE BIOMARKER-DRIVEN THERAPIES AND IMMUNOTHERAPIES IN PREVIOUSLY-TREATED NON-SMALL CELL LUNG CANCER (Lung-MAP Screening Study).  Met with patient for 10 minutes in infusion room and patient confirmed that Dr. Julien Nordmann introduced this study to him today and he is interested in learning more about it.  Provided patient with consent form for protocol version date 08/20/18 and Hippa form for Lung Map study.  Briefly explained the purpose and possible risks and benefits of enrolling on this study. Informed patient participation is completely voluntary and he can change his mind at any time. Enrollment is dependent on eligibility which includes having adequate tumor tissue to send to study for testing. Patient agreed verbally for research nurse to check with pathology department to find out if he has adequate tumor tissue for this study.  Informed patient if he does not have enough tissue then he would be required to have another biopsy to be eligible for this study.  Patient verbalized understanding and he agreed to review the consent and hippa forms before his next appointment. He wants to discuss it with his wife.  Gave patient my business card and encouraged him to call if any questions or concerns before his next appointment.  Plan to call patient prior to his next clinic visit in 3 weeks to follow up on his interest and any questions.  Will call patient sooner if nurse finds out he does not have enough tissue the study.  Patient verbalized understanding.  Thanked patient for his time and his willingness to consider this study.  Foye Spurling, BSN, RN Clinical Research Nurse 01/27/2019 1:12 PM

## 2019-01-27 NOTE — Telephone Encounter (Signed)
Added additional cycles per 5/19 los - pt to get an updated schedule next visit .

## 2019-01-27 NOTE — Patient Instructions (Signed)
[facility] Discharge Instructions for Patients Receiving Chemotherapy  Today you received the following chemotherapy agent: Pembrolizumab [facility]).  To help prevent nausea and vomiting after your treatment, we encourage you to take your nausea medication as prescribed.   If you develop nausea and vomiting that is not controlled by your nausea medication, call the clinic.   BELOW ARE SYMPTOMS THAT SHOULD BE REPORTED IMMEDIATELY:  *FEVER GREATER THAN 100.5 F  *CHILLS WITH OR WITHOUT FEVER  NAUSEA AND VOMITING THAT IS NOT CONTROLLED WITH YOUR NAUSEA MEDICATION  *UNUSUAL SHORTNESS OF BREATH  *UNUSUAL BRUISING OR BLEEDING  TENDERNESS IN MOUTH AND THROAT WITH OR WITHOUT PRESENCE OF ULCERS  *URINARY PROBLEMS  *BOWEL PROBLEMS  UNUSUAL RASH Items with * indicate a potential emergency and should be followed up as soon as possible.  Feel free to call the clinic should you have any questions or concerns. The clinic phone number is [phone].  Please show the [place] at check-in to the Emergency Department and triage nurse.

## 2019-01-27 NOTE — Progress Notes (Signed)
[place] [phone]   [phone]  OFFICE PROGRESS NOTE  [name], [address] [place] [address]  DIAGNOSIS: Stage IV (T3, N0, M1c) non-small cell lung cancer, squamous cell carcinoma presented with large right infrahilar mass in addition to left upper lobe lung nodule as well as left axillary mass with left axillary lymph node diagnosed in August 2019.  PRIOR THERAPY:  1) Palliative radiotherapy to the right infrahilar mass as well as the axillary mass under the care of Dr. Lisbeth Renshaw. 2) Systemic chemotherapy with carboplatin for AUC of 5, paclitaxel 175 mg/M2 and Keytruda 200 mg IV every 3 weeks status post 4 cycles.  CURRENT THERAPY: Maintenance immunotherapy with single agent Keytruda 200 mg IV every 3 weeks status post 9 cycles.  INTERVAL HISTORY: Travis Padilla 73 y.o. male returns to the clinic today for a follow-up visit.  The patient is feeling fine today except for intermittent low back pain is requesting refill of oxycodone.  He denied having any chest pain, shortness of breath, cough or hemoptysis.  He denied having any recent weight loss or night sweats.  He has no nausea, vomiting, diarrhea or constipation.  He denied having any headache or visual.  He has no fever or chills.  He has been tolerating his treatment with fairly well. The patient had repeat CT scan of the chest, abdomen pelvis performed recently and is here for evaluation and discussion of his scan results.  MEDICAL HISTORY: Past Medical History:  Diagnosis Date   Abnormal nuclear stress test    December, 2013   Anemia    CAD (coronary artery disease)    Mild nonobstructive plaque in cath 2013   Chest pain    December, 2013   Dizziness    Gout    Hemorrhoid    Hyperlipidemia    Hypertension    Skin cancer    Spinal stenosis     ALLERGIES:  is allergic to trazodone and nefazodone.  MEDICATIONS:  Current Outpatient Medications    Medication Sig Dispense Refill   albuterol (PROVENTIL) (2.5 MG/3ML) 0.083% nebulizer solution Take 3 mLs (2.5 mg total) by nebulization every 6 (six) hours as needed for wheezing or shortness of breath. 75 mL 12   ALPRAZolam (XANAX) 0.25 MG tablet Take 1 tablet (0.25 mg total) by mouth at bedtime as needed for anxiety. 30 tablet 0   Artificial Tear Solution (SOOTHE XP OP) Place 1 drop into both eyes 2 (two) times daily.     carvedilol (COREG) 12.5 MG tablet TAKE 1 AND 1/2 TABLETS BY MOUTH TWO TIMES DAILY WITH A MEAL (Patient taking differently: Take 18.75 mg by mouth 2 (two) times daily with a meal. ) 270 tablet 3   diphenhydramine-acetaminophen (TYLENOL PM) 25-500 MG TABS tablet Take 1-2 tablets by mouth at bedtime as needed (pain).     levothyroxine (SYNTHROID) 125 MCG tablet TAKE 1 TABLET (125 MCG TOTAL) BY MOUTH DAILY BEFORE BREAKFAST. 30 tablet 1   lidocaine-prilocaine (EMLA) cream Apply 1 application topically as needed. (Patient taking differently: Apply 1 application topically daily as needed (port). ) 30 g 0   mirtazapine (REMERON) 15 MG tablet Take 1 tablet (15 mg total) by mouth at bedtime. (Patient taking differently: Take 15 mg by mouth at bedtime as needed (sleep). ) 30 tablet 5   Multiple Vitamins-Minerals (PRESERVISION AREDS 2 PO) Take by mouth.     pravastatin (PRAVACHOL) 40 MG tablet TAKE 1 TABLET  BY MOUTH EVERY DAY (Patient taking differently: Take 40 mg by mouth at bedtime. ) 90 tablet 3   senna-docusate (SENNA S) 8.6-50 MG tablet 1 to 2 twice daily for constipation (Patient taking differently: Take 1 tablet by mouth 2 (two) times daily. ) 120 tablet 5   gabapentin (NEURONTIN) 100 MG capsule TAKE 1 CAPSULE BY MOUTH THREE TIMES A DAY (Patient not taking: Reported on 11/04/2018) 270 capsule 1   oxyCODONE-acetaminophen (PERCOCET/ROXICET) 5-325 MG tablet Take 1 tablet by mouth every 8 (eight) hours as needed for severe pain. (Patient not taking: Reported on 01/27/2019) 30  tablet 0   prochlorperazine (COMPAZINE) 10 MG tablet Take 1 tablet (10 mg total) by mouth every 6 (six) hours as needed for nausea or vomiting. (Patient not taking: Reported on 09/01/2018) 30 tablet 1   No current facility-administered medications for this visit.     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   BELPHAROPTOSIS REPAIR Bilateral    CATARACT EXTRACTION W/ INTRAOCULAR LENS  IMPLANT, BILATERAL     COLONOSCOPY N/A 11/03/2014   Procedure: COLONOSCOPY;  Surgeon: Rogene Houston, MD;  Location: [facility];  Service: Endoscopy;  Laterality: N/A;  1225   IR IMAGING GUIDED PORT INSERTION  07/11/2018   KNEE CARTILAGE SURGERY Left    Left knee   SKIN CANCER EXCISION  12/2014, 04/25/15   VIDEO BRONCHOSCOPY Bilateral 05/09/2018   Procedure: VIDEO BRONCHOSCOPY WITH FLUORO;  Surgeon: Juanito Doom, MD;  Location: [facility];  Service: Cardiopulmonary;  Laterality: Bilateral;    REVIEW OF SYSTEMS:  Constitutional: positive for fatigue Eyes: negative Ears, nose, mouth, throat, and face: negative Respiratory: negative Cardiovascular: negative Gastrointestinal: negative Genitourinary:negative Integument/breast: negative Hematologic/lymphatic: negative Musculoskeletal:positive for back pain Neurological: negative Behavioral/Psych: negative Endocrine: negative Allergic/Immunologic: negative   PHYSICAL EXAMINATION: General appearance: alert, cooperative and no distress Head: Normocephalic, without obvious abnormality, atraumatic Neck: no adenopathy, no JVD, supple, symmetrical, trachea midline and thyroid not enlarged, symmetric, no tenderness/mass/nodules Lymph nodes: Cervical, supraclavicular, and axillary nodes normal. Resp: clear to auscultation bilaterally Back: symmetric, no curvature. ROM normal. No CVA tenderness. Cardio: regular rate and rhythm, S1, S2 normal, no murmur, click, rub or gallop GI:  soft, non-tender; bowel sounds normal; no masses,  no organomegaly Extremities: extremities normal, atraumatic, no cyanosis or edema Neurologic: Alert and oriented X 3, normal strength and tone. Normal symmetric reflexes. Normal coordination and gait  ECOG PERFORMANCE STATUS: 1 - Symptomatic but completely ambulatory  Blood pressure 122/81, pulse 79, temperature 98 F (36.7 C), temperature source Oral, resp. rate 18, height 6\' 1"  (1.854 m), weight 238 lb 14.4 oz (108.4 kg), SpO2 98 %.  LABORATORY DATA: Lab Results  Component Value Date   WBC 5.3 01/27/2019   HGB 12.2 (L) 01/27/2019   HCT 36.5 (L) 01/27/2019   MCV 90.3 01/27/2019   PLT 147 (L) 01/27/2019      Chemistry      Component Value Date/Time   NA 143 01/27/2019 1020   NA 144 04/09/2018 1507   K 4.4 01/27/2019 1020   CL 109 01/27/2019 1020   CO2 26 01/27/2019 1020   BUN 14 01/27/2019 1020   BUN 15 04/09/2018 1507   CREATININE 0.93 01/27/2019 1020      Component Value Date/Time   CALCIUM 9.1 01/27/2019 1020   ALKPHOS 50 01/27/2019 1020   AST 15 01/27/2019 1020   ALT 12 01/27/2019 1020   BILITOT  0.6 01/27/2019 1020       RADIOGRAPHIC STUDIES: Ct Chest W Contrast  Result Date: 01/23/2019 CLINICAL DATA:  Restaging lung cancer. Radiation therapy complete. Immunotherapy in progress. EXAM: CT CHEST, ABDOMEN, AND PELVIS WITH CONTRAST TECHNIQUE: Multidetector CT imaging of the chest, abdomen and pelvis was performed following the standard protocol during bolus administration of intravenous contrast. CONTRAST:  159mL OMNIPAQUE IOHEXOL 300 MG/ML  SOLN COMPARISON:  11/24/2018 FINDINGS: CT CHEST FINDINGS Cardiovascular: Coronary artery calcification and aortic atherosclerotic calcification. Port in the anterior chest wall with tip in distal SVC. Mediastinum/Nodes: No axillary supraclavicular adenopathy. No mediastinal hilar adenopathy. Lungs/Pleura: RIGHT perihilar linear fibrotic thickening with air bronchograms is unchanged  from comparison exam consistent with radiation parenchymal changes in the lung. Angular nodule in the lingula measures 2.4 by 1.7 cm unchanged from 2.2 by 1.7 cm. No new nodularity Musculoskeletal: No aggressive osseous lesion. CT ABDOMEN AND PELVIS FINDINGS Hepatobiliary: Hepatic cyst again noted in the LEFT hepatic lobe. No new lesions. Normal gallbladder Pancreas: Pancreas is normal. No ductal dilatation. No pancreatic inflammation. Spleen: Normal spleen Adrenals/urinary tract: Adrenal glands and kidneys are normal. The ureters and bladder normal. Stomach/Bowel: Stomach, small bowel, appendix, and cecum are normal. The colon and rectosigmoid colon are normal. Vascular/Lymphatic: Abdominal aorta is normal caliber. There is no retroperitoneal or periportal lymphadenopathy. No pelvic lymphadenopathy. Reproductive: Prostate normal. Other: No peritoneal metastasis Musculoskeletal: No aggressive osseous lesion. IMPRESSION: Chest Impression: 1. Stable perihilar radiation change in the RIGHT lung. No evidence of recurrence. 2. Stable flatten nodule in the LEFT lung. 3. No evidence of lung cancer recurrence in thorax. Abdomen / Pelvis Impression: 1. No evidence of metastatic disease in the abdomen pelvis. 2. No evidence skeletal metastasis. Electronically Signed   By: Suzy Bouchard M.D.   On: 01/23/2019 16:34   Ct Abdomen Pelvis W Contrast  Result Date: 01/23/2019 CLINICAL DATA:  Restaging lung cancer. Radiation therapy complete. Immunotherapy in progress. EXAM: CT CHEST, ABDOMEN, AND PELVIS WITH CONTRAST TECHNIQUE: Multidetector CT imaging of the chest, abdomen and pelvis was performed following the standard protocol during bolus administration of intravenous contrast. CONTRAST:  186mL OMNIPAQUE IOHEXOL 300 MG/ML  SOLN COMPARISON:  11/24/2018 FINDINGS: CT CHEST FINDINGS Cardiovascular: Coronary artery calcification and aortic atherosclerotic calcification. Port in the anterior chest wall with tip in distal SVC.  Mediastinum/Nodes: No axillary supraclavicular adenopathy. No mediastinal hilar adenopathy. Lungs/Pleura: RIGHT perihilar linear fibrotic thickening with air bronchograms is unchanged from comparison exam consistent with radiation parenchymal changes in the lung. Angular nodule in the lingula measures 2.4 by 1.7 cm unchanged from 2.2 by 1.7 cm. No new nodularity Musculoskeletal: No aggressive osseous lesion. CT ABDOMEN AND PELVIS FINDINGS Hepatobiliary: Hepatic cyst again noted in the LEFT hepatic lobe. No new lesions. Normal gallbladder Pancreas: Pancreas is normal. No ductal dilatation. No pancreatic inflammation. Spleen: Normal spleen Adrenals/urinary tract: Adrenal glands and kidneys are normal. The ureters and bladder normal. Stomach/Bowel: Stomach, small bowel, appendix, and cecum are normal. The colon and rectosigmoid colon are normal. Vascular/Lymphatic: Abdominal aorta is normal caliber. There is no retroperitoneal or periportal lymphadenopathy. No pelvic lymphadenopathy. Reproductive: Prostate normal. Other: No peritoneal metastasis Musculoskeletal: No aggressive osseous lesion. IMPRESSION: Chest Impression: 1. Stable perihilar radiation change in the RIGHT lung. No evidence of recurrence. 2. Stable flatten nodule in the LEFT lung. 3. No evidence of lung cancer recurrence in thorax. Abdomen / Pelvis Impression: 1. No evidence of metastatic disease in the abdomen pelvis. 2. No evidence skeletal metastasis. Electronically Signed   By: Nicole Kindred  Leonia Reeves M.D.   On: 01/23/2019 16:34    ASSESSMENT AND PLAN: This is a very pleasant 73 years old white male with very light most smoking history recently diagnosed with stage IV (T3, N0, M1c) non-small cell lung cancer, squamous cell carcinoma based on the biopsy from the left axillary mass. He status post 4 cycles of induction systemic chemotherapy with carboplatin, paclitaxel and Keytruda with partial response.  The patient is currently on maintenance treatment  with single agent Keytruda status post 9 cycles. The patient has been tolerating this treatment well with no concerning adverse effects. He had repeat CT scan of the chest, abdomen and pelvis performed recently.  I personally and independently reviewed the scan and discussed the results with the patient today. His scan showed no concerning findings for disease progression and he has a stable disease. I recommended for the patient to continue his current treatment with [facility] and he will proceed with cycle #10 today. For the back pain, I will give the patient a refill of oxycodone. For the hypothyroidism we will continue to monitor his TSH closely and adjust his dose of levothyroxine accordingly. He will come back for follow-up visit in 3 weeks for evaluation before starting cycle #11. The patient was advised to call immediately if he has any concerning symptoms in the interval The patient voices understanding of current disease status and treatment options and is in agreement with the current care plan. All questions were answered. The patient knows to call the clinic with any problems, questions or concerns. We can certainly see the patient much sooner if necessary.  Disclaimer: This note was dictated with voice recognition software. Similar sounding words can inadvertently be transcribed and may not be corrected upon review.

## 2019-01-28 ENCOUNTER — Telehealth: Payer: Self-pay | Admitting: *Deleted

## 2019-01-28 NOTE — Telephone Encounter (Signed)
Lung Map Study- Notified by Pathology that patient does not have adequate tissue for this study.  Notified Dr. Julien Nordmann and informed him that if patient has disease progression and needs another biopsy in the future to please notify research department to re-evaluate for eligibility.   Called patient and notified him of above.  Thanked patient very much for his time and informed patient he may be eligible in the future if he has another biopsy.  Patient verbalized understanding.  Foye Spurling, BSN, RN Clinical Research Nurse 01/28/2019 2:58 PM

## 2019-01-31 ENCOUNTER — Other Ambulatory Visit: Payer: Self-pay | Admitting: Medical

## 2019-01-31 DIAGNOSIS — C3491 Malignant neoplasm of unspecified part of right bronchus or lung: Secondary | ICD-10-CM

## 2019-01-31 DIAGNOSIS — G4700 Insomnia, unspecified: Secondary | ICD-10-CM

## 2019-01-31 DIAGNOSIS — R630 Anorexia: Secondary | ICD-10-CM

## 2019-02-17 ENCOUNTER — Inpatient Hospital Stay (HOSPITAL_BASED_OUTPATIENT_CLINIC_OR_DEPARTMENT_OTHER): Payer: Medicare Other | Admitting: Internal Medicine

## 2019-02-17 ENCOUNTER — Inpatient Hospital Stay: Payer: Medicare Other

## 2019-02-17 ENCOUNTER — Inpatient Hospital Stay: Payer: Medicare Other | Attending: Oncology

## 2019-02-17 ENCOUNTER — Encounter: Payer: Self-pay | Admitting: Internal Medicine

## 2019-02-17 ENCOUNTER — Other Ambulatory Visit: Payer: Self-pay

## 2019-02-17 ENCOUNTER — Other Ambulatory Visit: Payer: Self-pay | Admitting: Internal Medicine

## 2019-02-17 VITALS — BP 110/77 | HR 89 | Temp 98.9°F | Resp 18 | Ht 73.0 in | Wt 231.6 lb

## 2019-02-17 DIAGNOSIS — C7801 Secondary malignant neoplasm of right lung: Secondary | ICD-10-CM

## 2019-02-17 DIAGNOSIS — Z5112 Encounter for antineoplastic immunotherapy: Secondary | ICD-10-CM | POA: Insufficient documentation

## 2019-02-17 DIAGNOSIS — I2510 Atherosclerotic heart disease of native coronary artery without angina pectoris: Secondary | ICD-10-CM | POA: Diagnosis not present

## 2019-02-17 DIAGNOSIS — C3412 Malignant neoplasm of upper lobe, left bronchus or lung: Secondary | ICD-10-CM

## 2019-02-17 DIAGNOSIS — E785 Hyperlipidemia, unspecified: Secondary | ICD-10-CM | POA: Diagnosis not present

## 2019-02-17 DIAGNOSIS — I10 Essential (primary) hypertension: Secondary | ICD-10-CM | POA: Insufficient documentation

## 2019-02-17 DIAGNOSIS — Z85828 Personal history of other malignant neoplasm of skin: Secondary | ICD-10-CM | POA: Diagnosis not present

## 2019-02-17 DIAGNOSIS — Z95828 Presence of other vascular implants and grafts: Secondary | ICD-10-CM

## 2019-02-17 DIAGNOSIS — E039 Hypothyroidism, unspecified: Secondary | ICD-10-CM | POA: Insufficient documentation

## 2019-02-17 DIAGNOSIS — Z7951 Long term (current) use of inhaled steroids: Secondary | ICD-10-CM

## 2019-02-17 DIAGNOSIS — Z79899 Other long term (current) drug therapy: Secondary | ICD-10-CM

## 2019-02-17 DIAGNOSIS — C3491 Malignant neoplasm of unspecified part of right bronchus or lung: Secondary | ICD-10-CM

## 2019-02-17 DIAGNOSIS — E032 Hypothyroidism due to medicaments and other exogenous substances: Secondary | ICD-10-CM

## 2019-02-17 LAB — CBC WITH DIFFERENTIAL (CANCER CENTER ONLY)
Abs Immature Granulocytes: 0.02 10*3/uL (ref 0.00–0.07)
Basophils Absolute: 0 10*3/uL (ref 0.0–0.1)
Basophils Relative: 0 %
Eosinophils Absolute: 0.3 10*3/uL (ref 0.0–0.5)
Eosinophils Relative: 6 %
HCT: 37.3 % — ABNORMAL LOW (ref 39.0–52.0)
Hemoglobin: 12.6 g/dL — ABNORMAL LOW (ref 13.0–17.0)
Immature Granulocytes: 0 %
Lymphocytes Relative: 19 %
Lymphs Abs: 0.9 10*3/uL (ref 0.7–4.0)
MCH: 30.5 pg (ref 26.0–34.0)
MCHC: 33.8 g/dL (ref 30.0–36.0)
MCV: 90.3 fL (ref 80.0–100.0)
Monocytes Absolute: 0.8 10*3/uL (ref 0.1–1.0)
Monocytes Relative: 17 %
Neutro Abs: 2.8 10*3/uL (ref 1.7–7.7)
Neutrophils Relative %: 58 %
Platelet Count: 154 10*3/uL (ref 150–400)
RBC: 4.13 MIL/uL — ABNORMAL LOW (ref 4.22–5.81)
RDW: 12.3 % (ref 11.5–15.5)
WBC Count: 4.9 10*3/uL (ref 4.0–10.5)
nRBC: 0 % (ref 0.0–0.2)

## 2019-02-17 LAB — CMP (CANCER CENTER ONLY)
ALT: 10 U/L (ref 0–44)
AST: 14 U/L — ABNORMAL LOW (ref 15–41)
Albumin: 4.1 g/dL (ref 3.5–5.0)
Alkaline Phosphatase: 51 U/L (ref 38–126)
Anion gap: 8 (ref 5–15)
BUN: 19 mg/dL (ref 8–23)
CO2: 24 mmol/L (ref 22–32)
Calcium: 9.1 mg/dL (ref 8.9–10.3)
Chloride: 109 mmol/L (ref 98–111)
Creatinine: 0.97 mg/dL (ref 0.61–1.24)
GFR, Est AFR Am: >60 mL/min (ref >60)
GFR, Estimated: >60 mL/min (ref >60)
Glucose, Bld: 94 mg/dL (ref 70–99)
Potassium: 4.3 mmol/L (ref 3.5–5.1)
Sodium: 141 mmol/L (ref 135–145)
Total Bilirubin: 0.8 mg/dL (ref 0.3–1.2)
Total Protein: 7.6 g/dL (ref 6.5–8.1)

## 2019-02-17 LAB — TSH: TSH: 0.203 u[IU]/mL — ABNORMAL LOW (ref 0.320–4.118)

## 2019-02-17 MED ORDER — HEPARIN SOD (PORK) LOCK FLUSH 100 UNIT/ML IV SOLN
500.0000 [IU] | Freq: Once | INTRAVENOUS | Status: AC | PRN
  Administered 2019-02-17: 500 [IU]
  Filled 2019-02-17: qty 5

## 2019-02-17 MED ORDER — SODIUM CHLORIDE 0.9 % IV SOLN
Freq: Once | INTRAVENOUS | Status: AC
  Administered 2019-02-17: 13:00:00 via INTRAVENOUS
  Filled 2019-02-17: qty 250

## 2019-02-17 MED ORDER — SODIUM CHLORIDE 0.9% FLUSH
10.0000 mL | INTRAVENOUS | Status: DC | PRN
  Administered 2019-02-17: 10 mL
  Filled 2019-02-17: qty 10

## 2019-02-17 MED ORDER — SODIUM CHLORIDE 0.9 % IV SOLN
200.0000 mg | Freq: Once | INTRAVENOUS | Status: AC
  Administered 2019-02-17: 200 mg via INTRAVENOUS
  Filled 2019-02-17: qty 8

## 2019-02-17 MED ORDER — SODIUM CHLORIDE 0.9% FLUSH
10.0000 mL | Freq: Once | INTRAVENOUS | Status: AC
  Administered 2019-02-17: 10 mL
  Filled 2019-02-17: qty 10

## 2019-02-17 NOTE — Progress Notes (Signed)
[place] [phone]   [phone]  OFFICE PROGRESS NOTE  [name], [place] [address]  DIAGNOSIS: Stage IV (T3, N0, M1c) non-small cell lung cancer, squamous cell carcinoma presented with large right infrahilar mass in addition to left upper lobe lung nodule as well as left axillary mass with left axillary lymph node diagnosed in August 2019.  PRIOR THERAPY:  1) Palliative radiotherapy to the right infrahilar mass as well as the axillary mass under the care of Dr. Lisbeth Renshaw. 2) Systemic chemotherapy with carboplatin for AUC of 5, paclitaxel 175 mg/M2 and Keytruda 200 mg IV every 3 weeks status post 4 cycles.  CURRENT THERAPY: Maintenance immunotherapy with single agent Keytruda 200 mg IV every 3 weeks status post 10 cycles.  INTERVAL HISTORY: Travis Padilla 73 y.o. male returns to the clinic today for follow-up visit.  The patient is feeling fine today with no concerning complaints except for intermittent low back pain.  He changed his exercise routine and doing a lot of relaxation exercise.  He feels better today.  He did not use oxycodone except once in the last few weeks.  He denied having any chest pain, shortness of breath, cough or hemoptysis.  He denied having any fever chills.  He has no nausea, vomiting, diarrhea or constipation.  He has been tolerating his treatment with Keytruda fairly well.  The patient is here today for evaluation before starting cycle #11.  MEDICAL HISTORY: Past Medical History:  Diagnosis Date   Abnormal nuclear stress test    December, 2013   Anemia    CAD (coronary artery disease)    Mild nonobstructive plaque in cath 2013   Chest pain    December, 2013   Dizziness    Gout    Hemorrhoid    Hyperlipidemia    Hypertension    Skin cancer    Spinal stenosis     ALLERGIES:  is allergic to trazodone and nefazodone.  MEDICATIONS:  Current Outpatient Medications    Medication Sig Dispense Refill   albuterol (PROVENTIL) (2.5 MG/3ML) 0.083% nebulizer solution Take 3 mLs (2.5 mg total) by nebulization every 6 (six) hours as needed for wheezing or shortness of breath. 75 mL 12   ALPRAZolam (XANAX) 0.25 MG tablet Take 1 tablet (0.25 mg total) by mouth at bedtime as needed for anxiety. 30 tablet 0   Artificial Tear Solution (SOOTHE XP OP) Place 1 drop into both eyes 2 (two) times daily.     carvedilol (COREG) 12.5 MG tablet TAKE 1 AND 1/2 TABLETS BY MOUTH TWO TIMES DAILY WITH A MEAL (Patient taking differently: Take 18.75 mg by mouth 2 (two) times daily with a meal. ) 270 tablet 3   diphenhydramine-acetaminophen (TYLENOL PM) 25-500 MG TABS tablet Take 1-2 tablets by mouth at bedtime as needed (pain).     levothyroxine (SYNTHROID) 125 MCG tablet TAKE 1 TABLET (125 MCG TOTAL) BY MOUTH DAILY BEFORE BREAKFAST. 30 tablet 1   lidocaine-prilocaine (EMLA) cream Apply 1 application topically as needed. (Patient taking differently: Apply 1 application topically daily as needed (port). ) 30 g 0   mirtazapine (REMERON) 15 MG tablet TAKE 1 TABLET BY MOUTH EVERYDAY AT BEDTIME 30 tablet 0   Multiple Vitamins-Minerals (PRESERVISION AREDS 2 PO) Take by mouth.     oxyCODONE-acetaminophen (PERCOCET/ROXICET) 5-325 MG tablet Take 1 tablet by mouth every 8 (eight) hours as needed for severe pain. 30 tablet 0   pravastatin (  PRAVACHOL) 40 MG tablet TAKE 1 TABLET BY MOUTH EVERY DAY (Patient taking differently: Take 40 mg by mouth at bedtime. ) 90 tablet 3   gabapentin (NEURONTIN) 100 MG capsule TAKE 1 CAPSULE BY MOUTH THREE TIMES A DAY (Patient not taking: Reported on 11/04/2018) 270 capsule 1   prochlorperazine (COMPAZINE) 10 MG tablet Take 1 tablet (10 mg total) by mouth every 6 (six) hours as needed for nausea or vomiting. (Patient not taking: Reported on 09/01/2018) 30 tablet 1   senna-docusate (SENNA S) 8.6-50 MG tablet 1 to 2 twice daily for constipation (Patient not  taking: Reported on 02/17/2019) 120 tablet 5   No current facility-administered medications for this visit.     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   BELPHAROPTOSIS REPAIR Bilateral    CATARACT EXTRACTION W/ INTRAOCULAR LENS  IMPLANT, BILATERAL     COLONOSCOPY N/A 11/03/2014   Procedure: COLONOSCOPY;  Surgeon: Rogene Houston, MD;  Location: [facility];  Service: Endoscopy;  Laterality: N/A;  1225   IR IMAGING GUIDED PORT INSERTION  07/11/2018   KNEE CARTILAGE SURGERY Left    Left knee   SKIN CANCER EXCISION  12/2014, 04/25/15   VIDEO BRONCHOSCOPY Bilateral 05/09/2018   Procedure: VIDEO BRONCHOSCOPY WITH FLUORO;  Surgeon: Juanito Doom, MD;  Location: [facility];  Service: Cardiopulmonary;  Laterality: Bilateral;    REVIEW OF SYSTEMS:  A comprehensive review of systems was negative except for: Musculoskeletal: positive for back pain   PHYSICAL EXAMINATION: General appearance: alert, cooperative and no distress Head: Normocephalic, without obvious abnormality, atraumatic Neck: no adenopathy, no JVD, supple, symmetrical, trachea midline and thyroid not enlarged, symmetric, no tenderness/mass/nodules Lymph nodes: Cervical, supraclavicular, and axillary nodes normal. Resp: clear to auscultation bilaterally Back: symmetric, no curvature. ROM normal. No CVA tenderness. Cardio: regular rate and rhythm, S1, S2 normal, no murmur, click, rub or gallop GI: soft, non-tender; bowel sounds normal; no masses,  no organomegaly Extremities: extremities normal, atraumatic, no cyanosis or edema  ECOG PERFORMANCE STATUS: 1 - Symptomatic but completely ambulatory  Blood pressure 110/77, pulse 89, temperature 98.9 F (37.2 C), temperature source Oral, resp. rate 18, height 6\' 1"  (1.854 m), weight 231 lb 9.6 oz (105.1 kg), SpO2 98 %.  LABORATORY DATA: Lab Results  Component Value Date   WBC 4.9 02/17/2019   HGB 12.6  (L) 02/17/2019   HCT 37.3 (L) 02/17/2019   MCV 90.3 02/17/2019   PLT 154 02/17/2019      Chemistry      Component Value Date/Time   NA 143 01/27/2019 1020   NA 144 04/09/2018 1507   K 4.4 01/27/2019 1020   CL 109 01/27/2019 1020   CO2 26 01/27/2019 1020   BUN 14 01/27/2019 1020   BUN 15 04/09/2018 1507   CREATININE 0.93 01/27/2019 1020      Component Value Date/Time   CALCIUM 9.1 01/27/2019 1020   ALKPHOS 50 01/27/2019 1020   AST 15 01/27/2019 1020   ALT 12 01/27/2019 1020   BILITOT 0.6 01/27/2019 1020       RADIOGRAPHIC STUDIES: Ct Chest W Contrast  Result Date: 01/23/2019 CLINICAL DATA:  Restaging lung cancer. Radiation therapy complete. Immunotherapy in progress. EXAM: CT CHEST, ABDOMEN, AND PELVIS WITH CONTRAST TECHNIQUE: Multidetector CT imaging of the chest, abdomen and pelvis was performed following the standard protocol during bolus administration of intravenous contrast. CONTRAST:  12mL OMNIPAQUE IOHEXOL 300 MG/ML  SOLN  COMPARISON:  11/24/2018 FINDINGS: CT CHEST FINDINGS Cardiovascular: Coronary artery calcification and aortic atherosclerotic calcification. Port in the anterior chest wall with tip in distal SVC. Mediastinum/Nodes: No axillary supraclavicular adenopathy. No mediastinal hilar adenopathy. Lungs/Pleura: RIGHT perihilar linear fibrotic thickening with air bronchograms is unchanged from comparison exam consistent with radiation parenchymal changes in the lung. Angular nodule in the lingula measures 2.4 by 1.7 cm unchanged from 2.2 by 1.7 cm. No new nodularity Musculoskeletal: No aggressive osseous lesion. CT ABDOMEN AND PELVIS FINDINGS Hepatobiliary: Hepatic cyst again noted in the LEFT hepatic lobe. No new lesions. Normal gallbladder Pancreas: Pancreas is normal. No ductal dilatation. No pancreatic inflammation. Spleen: Normal spleen Adrenals/urinary tract: Adrenal glands and kidneys are normal. The ureters and bladder normal. Stomach/Bowel: Stomach, small bowel,  appendix, and cecum are normal. The colon and rectosigmoid colon are normal. Vascular/Lymphatic: Abdominal aorta is normal caliber. There is no retroperitoneal or periportal lymphadenopathy. No pelvic lymphadenopathy. Reproductive: Prostate normal. Other: No peritoneal metastasis Musculoskeletal: No aggressive osseous lesion. IMPRESSION: Chest Impression: 1. Stable perihilar radiation change in the RIGHT lung. No evidence of recurrence. 2. Stable flatten nodule in the LEFT lung. 3. No evidence of lung cancer recurrence in thorax. Abdomen / Pelvis Impression: 1. No evidence of metastatic disease in the abdomen pelvis. 2. No evidence skeletal metastasis. Electronically Signed   By: Suzy Bouchard M.D.   On: 01/23/2019 16:34   Ct Abdomen Pelvis W Contrast  Result Date: 01/23/2019 CLINICAL DATA:  Restaging lung cancer. Radiation therapy complete. Immunotherapy in progress. EXAM: CT CHEST, ABDOMEN, AND PELVIS WITH CONTRAST TECHNIQUE: Multidetector CT imaging of the chest, abdomen and pelvis was performed following the standard protocol during bolus administration of intravenous contrast. CONTRAST:  140mL OMNIPAQUE IOHEXOL 300 MG/ML  SOLN COMPARISON:  11/24/2018 FINDINGS: CT CHEST FINDINGS Cardiovascular: Coronary artery calcification and aortic atherosclerotic calcification. Port in the anterior chest wall with tip in distal SVC. Mediastinum/Nodes: No axillary supraclavicular adenopathy. No mediastinal hilar adenopathy. Lungs/Pleura: RIGHT perihilar linear fibrotic thickening with air bronchograms is unchanged from comparison exam consistent with radiation parenchymal changes in the lung. Angular nodule in the lingula measures 2.4 by 1.7 cm unchanged from 2.2 by 1.7 cm. No new nodularity Musculoskeletal: No aggressive osseous lesion. CT ABDOMEN AND PELVIS FINDINGS Hepatobiliary: Hepatic cyst again noted in the LEFT hepatic lobe. No new lesions. Normal gallbladder Pancreas: Pancreas is normal. No ductal dilatation.  No pancreatic inflammation. Spleen: Normal spleen Adrenals/urinary tract: Adrenal glands and kidneys are normal. The ureters and bladder normal. Stomach/Bowel: Stomach, small bowel, appendix, and cecum are normal. The colon and rectosigmoid colon are normal. Vascular/Lymphatic: Abdominal aorta is normal caliber. There is no retroperitoneal or periportal lymphadenopathy. No pelvic lymphadenopathy. Reproductive: Prostate normal. Other: No peritoneal metastasis Musculoskeletal: No aggressive osseous lesion. IMPRESSION: Chest Impression: 1. Stable perihilar radiation change in the RIGHT lung. No evidence of recurrence. 2. Stable flatten nodule in the LEFT lung. 3. No evidence of lung cancer recurrence in thorax. Abdomen / Pelvis Impression: 1. No evidence of metastatic disease in the abdomen pelvis. 2. No evidence skeletal metastasis. Electronically Signed   By: Suzy Bouchard M.D.   On: 01/23/2019 16:34    ASSESSMENT AND PLAN: This is a very pleasant 73 years old white male with very light most smoking history recently diagnosed with stage IV (T3, N0, M1c) non-small cell lung cancer, squamous cell carcinoma based on the biopsy from the left axillary mass. He status post 4 cycles of induction systemic chemotherapy with carboplatin, paclitaxel and Keytruda with partial  response.  The patient is currently on maintenance treatment with single agent Keytruda status post 10 cycles. He has been tolerating this treatment well with no concerning adverse effects. I recommended for him to proceed with cycle #11 today as planned. For the back pain, he will continue on Tylenol and oxycodone as needed. For the hypothyroidism we will continue to monitor his TSH closely and adjust his dose of levothyroxine accordingly. He will come back for follow-up visit in 3 weeks for evaluation before the next cycle of his treatment. The patient was advised to call immediately if he has any concerning symptoms in the interval. The  patient voices understanding of current disease status and treatment options and is in agreement with the current care plan. All questions were answered. The patient knows to call the clinic with any problems, questions or concerns. We can certainly see the patient much sooner if necessary.  Disclaimer: This note was dictated with voice recognition software. Similar sounding words can inadvertently be transcribed and may not be corrected upon review.

## 2019-02-17 NOTE — Patient Instructions (Signed)
[facility] Discharge Instructions for Patients Receiving Chemotherapy  Today you received the following chemotherapy agent: Pembrolizumab [facility]).  To help prevent nausea and vomiting after your treatment, we encourage you to take your nausea medication as prescribed.   If you develop nausea and vomiting that is not controlled by your nausea medication, call the clinic.   BELOW ARE SYMPTOMS THAT SHOULD BE REPORTED IMMEDIATELY:  *FEVER GREATER THAN 100.5 F  *CHILLS WITH OR WITHOUT FEVER  NAUSEA AND VOMITING THAT IS NOT CONTROLLED WITH YOUR NAUSEA MEDICATION  *UNUSUAL SHORTNESS OF BREATH  *UNUSUAL BRUISING OR BLEEDING  TENDERNESS IN MOUTH AND THROAT WITH OR WITHOUT PRESENCE OF ULCERS  *URINARY PROBLEMS  *BOWEL PROBLEMS  UNUSUAL RASH Items with * indicate a potential emergency and should be followed up as soon as possible.  Feel free to call the clinic should you have any questions or concerns. The clinic phone number is [phone].  Please show the [place] at check-in to the Emergency Department and triage nurse.

## 2019-02-18 ENCOUNTER — Telehealth: Payer: Self-pay | Admitting: Internal Medicine

## 2019-02-18 NOTE — Telephone Encounter (Signed)
Scheduled appt per 6/09 los - pt to get an updated schedule next visit - added additional cycled to what was already scheduled.

## 2019-02-28 ENCOUNTER — Other Ambulatory Visit: Payer: Self-pay | Admitting: Internal Medicine

## 2019-02-28 DIAGNOSIS — C3491 Malignant neoplasm of unspecified part of right bronchus or lung: Secondary | ICD-10-CM

## 2019-02-28 DIAGNOSIS — R630 Anorexia: Secondary | ICD-10-CM

## 2019-02-28 DIAGNOSIS — G4700 Insomnia, unspecified: Secondary | ICD-10-CM

## 2019-03-09 ENCOUNTER — Other Ambulatory Visit: Payer: Self-pay | Admitting: Internal Medicine

## 2019-03-09 DIAGNOSIS — C3491 Malignant neoplasm of unspecified part of right bronchus or lung: Secondary | ICD-10-CM

## 2019-03-10 ENCOUNTER — Telehealth: Payer: Self-pay | Admitting: Internal Medicine

## 2019-03-10 ENCOUNTER — Inpatient Hospital Stay: Payer: Medicare Other

## 2019-03-10 ENCOUNTER — Encounter: Payer: Self-pay | Admitting: Internal Medicine

## 2019-03-10 ENCOUNTER — Other Ambulatory Visit: Payer: Self-pay

## 2019-03-10 ENCOUNTER — Inpatient Hospital Stay (HOSPITAL_BASED_OUTPATIENT_CLINIC_OR_DEPARTMENT_OTHER): Payer: Medicare Other | Admitting: Internal Medicine

## 2019-03-10 VITALS — BP 105/73 | HR 86 | Temp 98.7°F | Resp 18 | Ht 73.0 in | Wt 232.0 lb

## 2019-03-10 DIAGNOSIS — E039 Hypothyroidism, unspecified: Secondary | ICD-10-CM | POA: Diagnosis not present

## 2019-03-10 DIAGNOSIS — I2510 Atherosclerotic heart disease of native coronary artery without angina pectoris: Secondary | ICD-10-CM

## 2019-03-10 DIAGNOSIS — Z79899 Other long term (current) drug therapy: Secondary | ICD-10-CM | POA: Diagnosis not present

## 2019-03-10 DIAGNOSIS — E032 Hypothyroidism due to medicaments and other exogenous substances: Secondary | ICD-10-CM

## 2019-03-10 DIAGNOSIS — C3490 Malignant neoplasm of unspecified part of unspecified bronchus or lung: Secondary | ICD-10-CM

## 2019-03-10 DIAGNOSIS — Z95828 Presence of other vascular implants and grafts: Secondary | ICD-10-CM

## 2019-03-10 DIAGNOSIS — Z85828 Personal history of other malignant neoplasm of skin: Secondary | ICD-10-CM | POA: Diagnosis not present

## 2019-03-10 DIAGNOSIS — C3491 Malignant neoplasm of unspecified part of right bronchus or lung: Secondary | ICD-10-CM

## 2019-03-10 DIAGNOSIS — Z5112 Encounter for antineoplastic immunotherapy: Secondary | ICD-10-CM | POA: Diagnosis not present

## 2019-03-10 DIAGNOSIS — I10 Essential (primary) hypertension: Secondary | ICD-10-CM | POA: Diagnosis not present

## 2019-03-10 DIAGNOSIS — E785 Hyperlipidemia, unspecified: Secondary | ICD-10-CM | POA: Diagnosis not present

## 2019-03-10 DIAGNOSIS — Z7951 Long term (current) use of inhaled steroids: Secondary | ICD-10-CM

## 2019-03-10 DIAGNOSIS — C3412 Malignant neoplasm of upper lobe, left bronchus or lung: Secondary | ICD-10-CM

## 2019-03-10 DIAGNOSIS — C7801 Secondary malignant neoplasm of right lung: Secondary | ICD-10-CM

## 2019-03-10 LAB — CBC WITH DIFFERENTIAL (CANCER CENTER ONLY)
Abs Immature Granulocytes: 0.01 10*3/uL (ref 0.00–0.07)
Basophils Absolute: 0 10*3/uL (ref 0.0–0.1)
Basophils Relative: 0 %
Eosinophils Absolute: 0.3 10*3/uL (ref 0.0–0.5)
Eosinophils Relative: 6 %
HCT: 35 % — ABNORMAL LOW (ref 39.0–52.0)
Hemoglobin: 12.1 g/dL — ABNORMAL LOW (ref 13.0–17.0)
Immature Granulocytes: 0 %
Lymphocytes Relative: 24 %
Lymphs Abs: 1.1 10*3/uL (ref 0.7–4.0)
MCH: 31.3 pg (ref 26.0–34.0)
MCHC: 34.6 g/dL (ref 30.0–36.0)
MCV: 90.7 fL (ref 80.0–100.0)
Monocytes Absolute: 0.7 10*3/uL (ref 0.1–1.0)
Monocytes Relative: 16 %
Neutro Abs: 2.5 10*3/uL (ref 1.7–7.7)
Neutrophils Relative %: 54 %
Platelet Count: 157 10*3/uL (ref 150–400)
RBC: 3.86 MIL/uL — ABNORMAL LOW (ref 4.22–5.81)
RDW: 12.2 % (ref 11.5–15.5)
WBC Count: 4.7 10*3/uL (ref 4.0–10.5)
nRBC: 0 % (ref 0.0–0.2)

## 2019-03-10 LAB — CMP (CANCER CENTER ONLY)
ALT: 8 U/L (ref 0–44)
AST: 12 U/L — ABNORMAL LOW (ref 15–41)
Albumin: 3.9 g/dL (ref 3.5–5.0)
Alkaline Phosphatase: 55 U/L (ref 38–126)
Anion gap: 7 (ref 5–15)
BUN: 13 mg/dL (ref 8–23)
CO2: 24 mmol/L (ref 22–32)
Calcium: 8.8 mg/dL — ABNORMAL LOW (ref 8.9–10.3)
Chloride: 111 mmol/L (ref 98–111)
Creatinine: 1.1 mg/dL (ref 0.61–1.24)
GFR, Est AFR Am: >60 mL/min (ref >60)
GFR, Estimated: >60 mL/min (ref >60)
Glucose, Bld: 98 mg/dL (ref 70–99)
Potassium: 4.2 mmol/L (ref 3.5–5.1)
Sodium: 142 mmol/L (ref 135–145)
Total Bilirubin: 0.4 mg/dL (ref 0.3–1.2)
Total Protein: 7.6 g/dL (ref 6.5–8.1)

## 2019-03-10 LAB — TSH: TSH: 0.125 u[IU]/mL — ABNORMAL LOW (ref 0.320–4.118)

## 2019-03-10 MED ORDER — LEVOTHYROXINE SODIUM 100 MCG PO TABS: 100.0000 ug | ORAL_TABLET | Freq: Every day | ORAL | 1 refills | Status: DC

## 2019-03-10 MED ORDER — HEPARIN SOD (PORK) LOCK FLUSH 100 UNIT/ML IV SOLN
500.0000 [IU] | Freq: Once | INTRAVENOUS | Status: AC | PRN
  Administered 2019-03-10: 17:00:00 500 [IU]
  Filled 2019-03-10: qty 5

## 2019-03-10 MED ORDER — SODIUM CHLORIDE 0.9% FLUSH
10.0000 mL | Freq: Once | INTRAVENOUS | Status: AC
  Administered 2019-03-10: 10 mL
  Filled 2019-03-10: qty 10

## 2019-03-10 MED ORDER — SODIUM CHLORIDE 0.9 % IV SOLN
200.0000 mg | Freq: Once | INTRAVENOUS | Status: AC
  Administered 2019-03-10: 17:00:00 200 mg via INTRAVENOUS
  Filled 2019-03-10: qty 8

## 2019-03-10 MED ORDER — SODIUM CHLORIDE 0.9% FLUSH
10.0000 mL | INTRAVENOUS | Status: DC | PRN
  Administered 2019-03-10: 10 mL
  Filled 2019-03-10: qty 10

## 2019-03-10 MED ORDER — SODIUM CHLORIDE 0.9 % IV SOLN
Freq: Once | INTRAVENOUS | Status: AC
  Administered 2019-03-10: 16:00:00 via INTRAVENOUS
  Filled 2019-03-10: qty 250

## 2019-03-10 NOTE — Patient Instructions (Signed)
[place] Discharge Instructions for Patients Receiving Chemotherapy  Today you received the following chemotherapy agent: Pembrolizumab [facility]).  To help prevent nausea and vomiting after your treatment, we encourage you to take your nausea medication as prescribed.   If you develop nausea and vomiting that is not controlled by your nausea medication, call the clinic.   BELOW ARE SYMPTOMS THAT SHOULD BE REPORTED IMMEDIATELY:  *FEVER GREATER THAN 100.5 F  *CHILLS WITH OR WITHOUT FEVER  NAUSEA AND VOMITING THAT IS NOT CONTROLLED WITH YOUR NAUSEA MEDICATION  *UNUSUAL SHORTNESS OF BREATH  *UNUSUAL BRUISING OR BLEEDING  TENDERNESS IN MOUTH AND THROAT WITH OR WITHOUT PRESENCE OF ULCERS  *URINARY PROBLEMS  *BOWEL PROBLEMS  UNUSUAL RASH Items with * indicate a potential emergency and should be followed up as soon as possible.  Feel free to call the clinic should you have any questions or concerns. The clinic phone number is [phone].  Please show the [place] at check-in to the Emergency Department and triage nurse.

## 2019-03-10 NOTE — Telephone Encounter (Signed)
Patient already scheduled for next 3 appts. Gave contrast

## 2019-03-10 NOTE — Progress Notes (Signed)
[place] [phone]   [phone]  OFFICE PROGRESS NOTE  [name], [place] [address]  DIAGNOSIS: Stage IV (T3, N0, M1c) non-small cell lung cancer, squamous cell carcinoma presented with large right infrahilar mass in addition to left upper lobe lung nodule as well as left axillary mass with left axillary lymph node diagnosed in August 2019.  PRIOR THERAPY:  1) Palliative radiotherapy to the right infrahilar mass as well as the axillary mass under the care of Dr. Lisbeth Renshaw. 2) Systemic chemotherapy with carboplatin for AUC of 5, paclitaxel 175 mg/M2 and Keytruda 200 mg IV every 3 weeks status post 4 cycles.  CURRENT THERAPY: Maintenance immunotherapy with single agent Keytruda 200 mg IV every 3 weeks status post 11 cycles.  INTERVAL HISTORY: Travis Padilla 73 y.o. male returns to the clinic today for follow-up visit.  The patient complains of lack of energy as well as low back pain and right shoulder pain that has been going on for several weeks.  He has a history of degenerative disc disease and was seen by orthopedic surgery in the past.  He also has dry mouth.  He denied having any chest pain, shortness of breath, cough or hemoptysis.  He denied having any fever or chills.  He has no nausea, vomiting, diarrhea or constipation.  He is here today for evaluation before starting cycle #12 of his maintenance therapy with single agent Keytruda.  MEDICAL HISTORY: Past Medical History:  Diagnosis Date  . Abnormal nuclear stress test    December, 2013  . Anemia   . CAD (coronary artery disease)    Mild nonobstructive plaque in cath 2013  . Chest pain    December, 2013  . Dizziness   . Gout   . Hemorrhoid   . Hyperlipidemia   . Hypertension   . Skin cancer   . Spinal stenosis     ALLERGIES:  is allergic to trazodone and nefazodone.  MEDICATIONS:  Current Outpatient Medications  Medication Sig Dispense Refill  .  albuterol (PROVENTIL) (2.5 MG/3ML) 0.083% nebulizer solution Take 3 mLs (2.5 mg total) by nebulization every 6 (six) hours as needed for wheezing or shortness of breath. 75 mL 12  . ALPRAZolam (XANAX) 0.25 MG tablet Take 1 tablet (0.25 mg total) by mouth at bedtime as needed for anxiety. 30 tablet 0  . Artificial Tear Solution (SOOTHE XP OP) Place 1 drop into both eyes 2 (two) times daily.    . carvedilol (COREG) 12.5 MG tablet TAKE 1 AND 1/2 TABLETS BY MOUTH TWO TIMES DAILY WITH A MEAL (Patient taking differently: Take 18.75 mg by mouth 2 (two) times daily with a meal. ) 270 tablet 3  . diphenhydramine-acetaminophen (TYLENOL PM) 25-500 MG TABS tablet Take 1-2 tablets by mouth at bedtime as needed (pain).    Marland Kitchen gabapentin (NEURONTIN) 100 MG capsule TAKE 1 CAPSULE BY MOUTH THREE TIMES A DAY (Patient not taking: Reported on 11/04/2018) 270 capsule 1  . levothyroxine (SYNTHROID) 125 MCG tablet TAKE 1 TABLET (125 MCG TOTAL) BY MOUTH DAILY BEFORE BREAKFAST. 30 tablet 1  . lidocaine-prilocaine (EMLA) cream Apply 1 application topically as needed. (Patient taking differently: Apply 1 application topically daily as needed (port). ) 30 g 0  . mirtazapine (REMERON) 15 MG tablet TAKE 1 TABLET BY MOUTH EVERYDAY AT BEDTIME 90 tablet 1  . Multiple Vitamins-Minerals (PRESERVISION AREDS 2 PO) Take by mouth.    . oxyCODONE-acetaminophen (PERCOCET/ROXICET)  5-325 MG tablet Take 1 tablet by mouth every 8 (eight) hours as needed for severe pain. 30 tablet 0  . pravastatin (PRAVACHOL) 40 MG tablet TAKE 1 TABLET BY MOUTH EVERY DAY (Patient taking differently: Take 40 mg by mouth at bedtime. ) 90 tablet 3  . prochlorperazine (COMPAZINE) 10 MG tablet Take 1 tablet (10 mg total) by mouth every 6 (six) hours as needed for nausea or vomiting. (Patient not taking: Reported on 09/01/2018) 30 tablet 1  . senna-docusate (SENNA S) 8.6-50 MG tablet 1 to 2 twice daily for constipation (Patient not taking: Reported on 02/17/2019) 120 tablet 5    No current facility-administered medications for this visit.     SURGICAL HISTORY:  Past Surgical History:  Procedure Laterality Date  . 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 BELPHAROPTOSIS REPAIR Bilateral   . CATARACT EXTRACTION W/ INTRAOCULAR LENS  IMPLANT, BILATERAL    . COLONOSCOPY N/A 11/03/2014   Procedure: COLONOSCOPY;  Surgeon: Rogene Houston, MD;  [facility];  Service: Endoscopy;  Laterality: N/A;  1225  . IR IMAGING GUIDED PORT INSERTION  07/11/2018  . KNEE CARTILAGE SURGERY Left    Left knee  . SKIN CANCER EXCISION  12/2014, 04/25/15  . VIDEO BRONCHOSCOPY Bilateral 05/09/2018   Procedure: VIDEO BRONCHOSCOPY WITH FLUORO;  Surgeon: Juanito Doom, MD;  Location: [facility];  Service: Cardiopulmonary;  Laterality: Bilateral;    REVIEW OF SYSTEMS:  A comprehensive review of systems was negative except for: Constitutional: positive for fatigue Musculoskeletal: positive for back pain   PHYSICAL EXAMINATION: General appearance: alert, cooperative, fatigued and no distress Head: Normocephalic, without obvious abnormality, atraumatic Neck: no adenopathy, no JVD, supple, symmetrical, trachea midline and thyroid not enlarged, symmetric, no tenderness/mass/nodules Lymph nodes: Cervical, supraclavicular, and axillary nodes normal. Resp: clear to auscultation bilaterally Back: symmetric, no curvature. ROM normal. No CVA tenderness. Cardio: regular rate and rhythm, S1, S2 normal, no murmur, click, rub or gallop GI: soft, non-tender; bowel sounds normal; no masses,  no organomegaly Extremities: extremities normal, atraumatic, no cyanosis or edema  ECOG PERFORMANCE STATUS: 1 - Symptomatic but completely ambulatory  Blood pressure 105/73, pulse 86, temperature 98.7 F (37.1 C), temperature source Oral, resp. rate 18, height 6\' 1"  (1.854 m), weight 232 lb (105.2 kg), SpO2 100 %.  LABORATORY DATA: Lab Results  Component Value Date   WBC 4.7 03/10/2019   HGB 12.1  (L) 03/10/2019   HCT 35.0 (L) 03/10/2019   MCV 90.7 03/10/2019   PLT 157 03/10/2019      Chemistry      Component Value Date/Time   NA 142 03/10/2019 1405   NA 144 04/09/2018 1507   K 4.2 03/10/2019 1405   CL 111 03/10/2019 1405   CO2 24 03/10/2019 1405   BUN 13 03/10/2019 1405   BUN 15 04/09/2018 1507   CREATININE 1.10 03/10/2019 1405      Component Value Date/Time   CALCIUM 8.8 (L) 03/10/2019 1405   ALKPHOS 55 03/10/2019 1405   AST 12 (L) 03/10/2019 1405   ALT 8 03/10/2019 1405   BILITOT 0.4 03/10/2019 1405       RADIOGRAPHIC STUDIES: No results found.  ASSESSMENT AND PLAN: This is a very pleasant 73 years old white male with very light most smoking history recently diagnosed with stage IV (T3, N0, M1c) non-small cell lung cancer, squamous cell carcinoma based on the biopsy from the left axillary mass. He status post 4 cycles of induction systemic chemotherapy  with carboplatin, paclitaxel and Keytruda with partial response.  The patient is currently on maintenance treatment with single agent Keytruda status post 11 cycles. The patient tolerated the last cycle of his treatment well with no concerning adverse effects. I recommended for him to proceed with cycle #12 of his treatment today as planned. I will see him back for follow-up visit in 3 weeks for evaluation after repeating CT scan of the chest, abdomen and pelvis for restaging of his disease. For the back pain, he will continue on Tylenol and oxycodone as needed. For the hypothyroidism we will continue to monitor his TSH closely and adjust his dose of levothyroxine accordingly. He was advised to call immediately if he has any concerning symptoms in the interval. The patient voices understanding of current disease status and treatment options and is in agreement with the current care plan. All questions were answered. The patient knows to call the clinic with any problems, questions or concerns. We can certainly see the  patient much sooner if necessary.  Disclaimer: This note was dictated with voice recognition software. Similar sounding words can inadvertently be transcribed and may not be corrected upon review.

## 2019-03-23 ENCOUNTER — Other Ambulatory Visit: Payer: Self-pay | Admitting: *Deleted

## 2019-03-23 MED ORDER — CARVEDILOL 12.5 MG PO TABS: ORAL_TABLET | ORAL | 0 refills | Status: DC

## 2019-03-30 ENCOUNTER — Other Ambulatory Visit: Payer: Self-pay

## 2019-03-30 ENCOUNTER — Ambulatory Visit (HOSPITAL_COMMUNITY)
Admission: RE | Admit: 2019-03-30 | Discharge: 2019-03-30 | Disposition: A | Discharge status: Home / Self Care | Payer: Medicare Other | Source: Ambulatory Visit | Attending: Internal Medicine | Admitting: Internal Medicine

## 2019-03-30 DIAGNOSIS — J479 Bronchiectasis, uncomplicated: Secondary | ICD-10-CM | POA: Diagnosis not present

## 2019-03-30 DIAGNOSIS — C3490 Malignant neoplasm of unspecified part of unspecified bronchus or lung: Secondary | ICD-10-CM | POA: Insufficient documentation

## 2019-03-30 DIAGNOSIS — N200 Calculus of kidney: Secondary | ICD-10-CM | POA: Diagnosis not present

## 2019-03-30 MED ORDER — SODIUM CHLORIDE (PF) 0.9 % IJ SOLN
INTRAMUSCULAR | Status: AC
  Filled 2019-03-30: qty 50

## 2019-03-30 MED ORDER — HEPARIN SOD (PORK) LOCK FLUSH 100 UNIT/ML IV SOLN
INTRAVENOUS | Status: AC
  Filled 2019-03-30: qty 5

## 2019-03-30 MED ORDER — IOHEXOL 300 MG/ML  SOLN
100.0000 mL | Freq: Once | INTRAMUSCULAR | Status: AC | PRN
  Administered 2019-03-30: 100 mL via INTRAVENOUS

## 2019-03-31 ENCOUNTER — Inpatient Hospital Stay (HOSPITAL_BASED_OUTPATIENT_CLINIC_OR_DEPARTMENT_OTHER): Payer: Medicare Other | Admitting: Physician Assistant

## 2019-03-31 ENCOUNTER — Telehealth: Payer: Self-pay | Admitting: Internal Medicine

## 2019-03-31 ENCOUNTER — Inpatient Hospital Stay: Payer: Medicare Other

## 2019-03-31 ENCOUNTER — Inpatient Hospital Stay: Payer: Medicare Other | Attending: Oncology

## 2019-03-31 ENCOUNTER — Encounter: Payer: Self-pay | Admitting: Physician Assistant

## 2019-03-31 ENCOUNTER — Other Ambulatory Visit: Payer: Self-pay

## 2019-03-31 VITALS — BP 109/73 | HR 76 | Temp 99.2°F | Resp 18 | Ht 73.0 in | Wt 226.8 lb

## 2019-03-31 DIAGNOSIS — M25511 Pain in right shoulder: Secondary | ICD-10-CM

## 2019-03-31 DIAGNOSIS — Z79899 Other long term (current) drug therapy: Secondary | ICD-10-CM

## 2019-03-31 DIAGNOSIS — G4700 Insomnia, unspecified: Secondary | ICD-10-CM

## 2019-03-31 DIAGNOSIS — C3491 Malignant neoplasm of unspecified part of right bronchus or lung: Secondary | ICD-10-CM

## 2019-03-31 DIAGNOSIS — I10 Essential (primary) hypertension: Secondary | ICD-10-CM

## 2019-03-31 DIAGNOSIS — C3412 Malignant neoplasm of upper lobe, left bronchus or lung: Secondary | ICD-10-CM

## 2019-03-31 DIAGNOSIS — Z5112 Encounter for antineoplastic immunotherapy: Secondary | ICD-10-CM | POA: Diagnosis not present

## 2019-03-31 DIAGNOSIS — Z95828 Presence of other vascular implants and grafts: Secondary | ICD-10-CM

## 2019-03-31 DIAGNOSIS — M25519 Pain in unspecified shoulder: Secondary | ICD-10-CM | POA: Insufficient documentation

## 2019-03-31 DIAGNOSIS — F419 Anxiety disorder, unspecified: Secondary | ICD-10-CM | POA: Insufficient documentation

## 2019-03-31 DIAGNOSIS — E785 Hyperlipidemia, unspecified: Secondary | ICD-10-CM | POA: Diagnosis not present

## 2019-03-31 DIAGNOSIS — C7801 Secondary malignant neoplasm of right lung: Secondary | ICD-10-CM

## 2019-03-31 DIAGNOSIS — E032 Hypothyroidism due to medicaments and other exogenous substances: Secondary | ICD-10-CM

## 2019-03-31 LAB — CMP (CANCER CENTER ONLY)
ALT: 10 U/L (ref 0–44)
AST: 14 U/L — ABNORMAL LOW (ref 15–41)
Albumin: 3.8 g/dL (ref 3.5–5.0)
Alkaline Phosphatase: 55 U/L (ref 38–126)
Anion gap: 9 (ref 5–15)
BUN: 15 mg/dL (ref 8–23)
CO2: 23 mmol/L (ref 22–32)
Calcium: 9.3 mg/dL (ref 8.9–10.3)
Chloride: 110 mmol/L (ref 98–111)
Creatinine: 1.02 mg/dL (ref 0.61–1.24)
GFR, Est AFR Am: >60 mL/min (ref >60)
GFR, Estimated: >60 mL/min (ref >60)
Glucose, Bld: 100 mg/dL — ABNORMAL HIGH (ref 70–99)
Potassium: 4.3 mmol/L (ref 3.5–5.1)
Sodium: 142 mmol/L (ref 135–145)
Total Bilirubin: 0.4 mg/dL (ref 0.3–1.2)
Total Protein: 7.3 g/dL (ref 6.5–8.1)

## 2019-03-31 LAB — CBC WITH DIFFERENTIAL (CANCER CENTER ONLY)
Abs Immature Granulocytes: 0.02 10*3/uL (ref 0.00–0.07)
Basophils Absolute: 0.1 10*3/uL (ref 0.0–0.1)
Basophils Relative: 1 %
Eosinophils Absolute: 0.3 10*3/uL (ref 0.0–0.5)
Eosinophils Relative: 5 %
HCT: 36.4 % — ABNORMAL LOW (ref 39.0–52.0)
Hemoglobin: 12.5 g/dL — ABNORMAL LOW (ref 13.0–17.0)
Immature Granulocytes: 0 %
Lymphocytes Relative: 20 %
Lymphs Abs: 1.1 10*3/uL (ref 0.7–4.0)
MCH: 31.3 pg (ref 26.0–34.0)
MCHC: 34.3 g/dL (ref 30.0–36.0)
MCV: 91 fL (ref 80.0–100.0)
Monocytes Absolute: 0.8 10*3/uL (ref 0.1–1.0)
Monocytes Relative: 14 %
Neutro Abs: 3.1 10*3/uL (ref 1.7–7.7)
Neutrophils Relative %: 60 %
Platelet Count: 172 10*3/uL (ref 150–400)
RBC: 4 MIL/uL — ABNORMAL LOW (ref 4.22–5.81)
RDW: 11.9 % (ref 11.5–15.5)
WBC Count: 5.3 10*3/uL (ref 4.0–10.5)
nRBC: 0 % (ref 0.0–0.2)

## 2019-03-31 LAB — TSH: TSH: 0.362 u[IU]/mL (ref 0.320–4.118)

## 2019-03-31 MED ORDER — ALPRAZOLAM 0.25 MG PO TABS: 0.2500 mg | ORAL_TABLET | Freq: Every evening | ORAL | 0 refills | Status: DC | PRN

## 2019-03-31 MED ORDER — SODIUM CHLORIDE 0.9 % IV SOLN
Freq: Once | INTRAVENOUS | Status: AC
  Administered 2019-03-31: 12:00:00 via INTRAVENOUS
  Filled 2019-03-31: qty 250

## 2019-03-31 MED ORDER — SODIUM CHLORIDE 0.9% FLUSH
10.0000 mL | Freq: Once | INTRAVENOUS | Status: AC
  Administered 2019-03-31: 10:00:00 10 mL
  Filled 2019-03-31: qty 10

## 2019-03-31 MED ORDER — SODIUM CHLORIDE 0.9 % IV SOLN
200.0000 mg | Freq: Once | INTRAVENOUS | Status: AC
  Administered 2019-03-31: 200 mg via INTRAVENOUS
  Filled 2019-03-31: qty 8

## 2019-03-31 MED ORDER — SODIUM CHLORIDE 0.9% FLUSH
10.0000 mL | INTRAVENOUS | Status: DC | PRN
  Administered 2019-03-31: 10 mL
  Filled 2019-03-31: qty 10

## 2019-03-31 MED ORDER — HEPARIN SOD (PORK) LOCK FLUSH 100 UNIT/ML IV SOLN
500.0000 [IU] | Freq: Once | INTRAVENOUS | Status: AC | PRN
  Administered 2019-03-31: 14:00:00 500 [IU]
  Filled 2019-03-31: qty 5

## 2019-03-31 NOTE — Telephone Encounter (Signed)
Scheduled appt per 7/21 LOS - pt to get an updated schedule next visit.   

## 2019-03-31 NOTE — Progress Notes (Signed)
[facility] OFFICE PROGRESS NOTE  [name], [place] [address]  DIAGNOSIS: Stage IV (T3, N0, M1c) non-small cell lung cancer, squamous cell carcinoma presented with large right infrahilar mass in addition to left upper lobe lung nodule as well as left axillary mass with left axillary lymph node diagnosed in August 2019.  PRIOR THERAPY: 1) Palliative radiotherapy to the right infrahilar mass as well as the axillary mass under the care of Dr. Lisbeth Renshaw. 2) Systemic chemotherapy with carboplatin for AUC of 5, paclitaxel 175 mg/M2 and Keytruda 200 mg IV every 3 weeks status post 4 cycles.  CURRENT THERAPY: Maintenance immunotherapy with single agent Keytruda 200 mg IV every 3 weeks status post 12 cycles.  INTERVAL HISTORY: Travis Padilla 73 y.o. male returns to the clinic for a follow up visit. The patient is feeling fairly well but he continues to experience right shoulder pain. The patient has a history of shoulder pain and had previously seen Dr. Veverly Fells from orthopedics. The patient cannot recall what was diagnosed as the etiology of his shoulder pain but he states he had had physical therapy for this problem. The patient believes this to feel similar to his prior history of shoulder pain. The patient denies any recent traumas or injuries but he states that he is often active doing yard work. He localizes his pain to his anterior shoulder and the pain is exacerbated by certain movements such as abducting and internally rotating his arm posteriorly. The pain is non-radiating. He takes percocet occasionally for his pain. He endorses weakness of his right hand. He denies any numbness or tingling.   The patient also expresses concern for insomnia. He was previously prescribed Remeron but stopped taking it approximately 3 weeks ago because he does not believe it to be beneficial. He has an old prescription for xanax and occasionally will  take one tablet to assist him  in falling asleep.   Regarding this treatment with immunotherapy, he is tolerating it fairly well without any adverse effects. He denies any recent fevers, chills, night sweats, or weight loss. He denies any chest pain, shortness of breath, cough, or hemoptysis. He denies any nausea, vomiting, or constipation. The patient states that he has developed diarrhea the last 3 days or so. He states he had about 2 loose stools a day. He states this occurs after he eats. He denies any abdominal pain or blood stool. He has not tried taking any OTC medications to control his diarrhea. He denies any recent antibiotic use. He denies any headaches or visual changes. He denies any rashes or skin changes. The patient recently had a restaging CT scan performed. The patient is here today for evaluation prior to starting cycle #12 of maintenance immunotherapy with Keytruda.   MEDICAL HISTORY: Past Medical History:  Diagnosis Date  . Abnormal nuclear stress test    December, 2013  . Anemia   . CAD (coronary artery disease)    Mild nonobstructive plaque in cath 2013  . Chest pain    December, 2013  . Dizziness   . Gout   . Hemorrhoid   . Hyperlipidemia   . Hypertension   . Skin cancer   . Spinal stenosis     ALLERGIES:  is allergic to trazodone and nefazodone.  MEDICATIONS:  Current Outpatient Medications  Medication Sig Dispense Refill  . albuterol (PROVENTIL) (2.5 MG/3ML) 0.083% nebulizer solution Take 3 mLs (2.5 mg total) by nebulization every 6 (six) hours  as needed for wheezing or shortness of breath. 75 mL 12  . Artificial Tear Solution (SOOTHE XP OP) Place 1 drop into both eyes 2 (two) times daily.    . Boswellia-Glucosamine-Vit D (OSTEO BI-FLEX ONE PER DAY PO) Take by mouth.    . carvedilol (COREG) 12.5 MG tablet TAKE 1 AND 1/2 TABLETS BY MOUTH TWO TIMES DAILY WITH A MEAL 90 tablet 0  . diphenhydramine-acetaminophen (TYLENOL PM) 25-500 MG TABS tablet Take 1-2 tablets by mouth at bedtime as needed  (pain).    Marland Kitchen levothyroxine (SYNTHROID) 100 MCG tablet Take 1 tablet (100 mcg total) by mouth daily before breakfast. 30 tablet 1  . lidocaine-prilocaine (EMLA) cream Apply 1 application topically as needed. (Patient taking differently: Apply 1 application topically daily as needed (port). ) 30 g 0  . Multiple Vitamins-Minerals (PRESERVISION AREDS 2 PO) Take by mouth.    . oxyCODONE-acetaminophen (PERCOCET/ROXICET) 5-325 MG tablet Take 1 tablet by mouth every 8 (eight) hours as needed for severe pain. 30 tablet 0  . pravastatin (PRAVACHOL) 40 MG tablet TAKE 1 TABLET BY MOUTH EVERY DAY (Patient taking differently: Take 40 mg by mouth at bedtime. ) 90 tablet 3  . prochlorperazine (COMPAZINE) 10 MG tablet Take 1 tablet (10 mg total) by mouth every 6 (six) hours as needed for nausea or vomiting. 30 tablet 1  . senna-docusate (SENNA S) 8.6-50 MG tablet 1 to 2 twice daily for constipation 120 tablet 5  . ALPRAZolam (XANAX) 0.25 MG tablet Take 1 tablet (0.25 mg total) by mouth at bedtime as needed for anxiety. 15 tablet 0  . mirtazapine (REMERON) 15 MG tablet TAKE 1 TABLET BY MOUTH EVERYDAY AT BEDTIME (Patient not taking: Reported on 03/31/2019) 90 tablet 1   No current facility-administered medications for this visit.    Facility-Administered Medications Ordered in Other Visits  Medication Dose Route Frequency Provider Last Rate Last Dose  . heparin lock flush 100 unit/mL  500 Units Intracatheter Once PRN Curt Bears, MD      . pembrolizumab [facility]) 200 mg in sodium chloride 0.9 % 50 mL chemo infusion  200 mg Intravenous Once Curt Bears, MD      . sodium chloride flush (NS) 0.9 % injection 10 mL  10 mL Intracatheter PRN Curt Bears, MD        SURGICAL HISTORY:  Past Surgical History:  Procedure Laterality Date  . 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 BELPHAROPTOSIS REPAIR Bilateral   . CATARACT EXTRACTION W/ INTRAOCULAR LENS  IMPLANT, BILATERAL    . COLONOSCOPY N/A 11/03/2014    Procedure: COLONOSCOPY;  Surgeon: Rogene Houston, MD;  Location: [facility] SUITE;  Service: Endoscopy;  Laterality: N/A;  1225  . IR IMAGING GUIDED PORT INSERTION  07/11/2018  . KNEE CARTILAGE SURGERY Left    Left knee  . SKIN CANCER EXCISION  12/2014, 04/25/15  . VIDEO BRONCHOSCOPY Bilateral 05/09/2018   Procedure: VIDEO BRONCHOSCOPY WITH FLUORO;  Surgeon: Juanito Doom, MD;  Location: [facility] ENDOSCOPY;  Service: Cardiopulmonary;  Laterality: Bilateral;    REVIEW OF SYSTEMS:   Review of Systems  Constitutional: Positive for fatigue. Negative for appetite change, chills, fever and unexpected weight change.  HENT:   Negative for mouth sores, nosebleeds, sore throat and trouble swallowing.   Eyes: Negative for eye problems and icterus.  Respiratory: Negative for cough, hemoptysis, shortness of breath and wheezing.   Cardiovascular: Negative for chest pain and leg swelling.  Gastrointestinal: Positive for diarrhea. Negative for abdominal  pain, constipation, nausea and vomiting.  Genitourinary: Negative for bladder incontinence, difficulty urinating, dysuria, frequency and hematuria.   Musculoskeletal: Positive for chronic low back pain and right shoulder pain. Negative for gait problem, neck pain and neck stiffness.  Skin: Negative for itching and rash.  Neurological: Negative for dizziness, extremity weakness, gait problem, headaches, light-headedness and seizures.  Hematological: Negative for adenopathy. Does not bruise/bleed easily.  Psychiatric/Behavioral: Negative for confusion, depression and sleep disturbance. The patient is not nervous/anxious.     PHYSICAL EXAMINATION:  Blood pressure 109/73, pulse 76, temperature 99.2 F (37.3 C), temperature source Oral, resp. rate 18, height 6\' 1"  (1.854 m), weight 226 lb 12.8 oz (102.9 kg), SpO2 99 %.  ECOG PERFORMANCE STATUS: 1 - Symptomatic but completely ambulatory  Physical Exam  Constitutional: Oriented to person, place, and time and  well-developed, well-nourished, and in no distress.  HENT:  Head: Normocephalic and atraumatic.  Mouth/Throat: Oropharynx is clear and moist. No oropharyngeal exudate.  Eyes: Conjunctivae are normal. Right eye exhibits no discharge. Left eye exhibits no discharge. No scleral icterus.  Neck: Normal range of motion. Neck supple.  Cardiovascular: Normal rate, regular rhythm, normal heart sounds and intact distal pulses.   Pulmonary/Chest: Effort normal and breath sounds normal. No respiratory distress. No wheezes. No rales.  Abdominal: Soft. Bowel sounds are normal. Exhibits no distension and no mass. There is no tenderness.  Musculoskeletal: Inspection revealed normal lordotic curvature of the neck and symmetry of the shoulders.  No visible effusion present.  No spinous process, clavicle, scapular, humerus tenderness or crepitus to palpation bilaterally.  No tenderness or spasm palpable of the sternocleidomastoid, trapezius, latissimus dorsi, deltoid, rotator cuff, rhomboid, subacromial or subdeltoid bursae, biceps, or biceps tendon tenderness to palpation bilaterally.  Full active ROM of neck and shoulder bilaterally. Negative Neer's, Hawkin's, drop arm, and empty can tests.  Neurovascularly intact distal upper extremities. Exhibits no edema.  Lymphadenopathy:    No cervical adenopathy.  Neurological: Alert and oriented to person, place, and time. Exhibits normal muscle tone. Gait normal. Coordination normal.  Skin: Skin is warm and dry. No rash noted. Not diaphoretic. No erythema. No pallor.  Psychiatric: Mood, memory and judgment normal.  Vitals reviewed.  LABORATORY DATA: Lab Results  Component Value Date   WBC 5.3 03/31/2019   HGB 12.5 (L) 03/31/2019   HCT 36.4 (L) 03/31/2019   MCV 91.0 03/31/2019   PLT 172 03/31/2019      Chemistry      Component Value Date/Time   NA 142 03/31/2019 0947   NA 144 04/09/2018 1507   K 4.3 03/31/2019 0947   CL 110 03/31/2019 0947   CO2 23 03/31/2019  0947   BUN 15 03/31/2019 0947   BUN 15 04/09/2018 1507   CREATININE 1.02 03/31/2019 0947      Component Value Date/Time   CALCIUM 9.3 03/31/2019 0947   ALKPHOS 55 03/31/2019 0947   AST 14 (L) 03/31/2019 0947   ALT 10 03/31/2019 0947   BILITOT 0.4 03/31/2019 0947       RADIOGRAPHIC STUDIES:  Ct Chest W Contrast  Result Date: 03/30/2019 CLINICAL DATA:  History of bilateral lung cancer. Status post radiation and chemotherapy. EXAM: CT CHEST, ABDOMEN, AND PELVIS WITH CONTRAST TECHNIQUE: Multidetector CT imaging of the chest, abdomen and pelvis was performed following the standard protocol during bolus administration of intravenous contrast. CONTRAST:  165mL OMNIPAQUE IOHEXOL 300 MG/ML  SOLN COMPARISON:  CT scan 01/23/2019 FINDINGS: CT CHEST FINDINGS Cardiovascular: The heart is normal in  size. No pericardial effusion. Advanced three-vessel coronary artery calcifications are noted. The Port-A-Cath is in good position. The aorta is normal in caliber. Moderate tortuosity but no aneurysm or dissection. The branch vessels are patent. Mediastinum/Nodes: No mediastinal or hilar mass or adenopathy. A few small scattered sub 8 mm mediastinal lymph nodes are stable. The esophagus is grossly normal. Lungs/Pleura: Stable radiation changes involving the right hilum and right paramediastinal lung with associated bronchiectasis. I do not see any findings suspicious for residual or recurrent tumor. Stable Y shaped nodular lesion in the left upper lobe on image number 69 measuring approximately 29 x 25 mm. No new pulmonary lesions or pulmonary nodules to suggest metastatic disease. No pleural effusion or pleural nodules. Musculoskeletal: No chest wall mass, supraclavicular or axillary adenopathy. The bony structures are unremarkable. No findings suspicious for metastatic bone disease. CT ABDOMEN PELVIS FINDINGS Hepatobiliary: Stable simple hepatic cysts. No worrisome hepatic lesions to suggest metastatic disease. The  gallbladder is mildly contracted. No common bile duct dilatation. Pancreas: No mass, inflammation or ductal dilatation. Spleen: Normal size.  No focal lesions. Adrenals/Urinary Tract: The adrenal glands are unremarkable. No worrisome adrenal gland lesions. Small bilateral renal calculi but no worrisome renal lesions or hydroureteronephrosis. The bladder appears normal. Stomach/Bowel: The stomach, duodenum, small bowel and colon are unremarkable. No acute inflammatory changes, mass lesions or obstructive findings. The terminal ileum and appendix are normal. Descending and sigmoid colon diverticulosis without findings for acute diverticulitis. Vascular/Lymphatic: Moderate atherosclerotic calcifications involving the abdominal aorta and branch vessels but no aneurysm or dissection. The major venous structures are patent. No mesenteric or retroperitoneal mass or adenopathy. Reproductive: Mild prostate gland enlargement with median lobe hypertrophy impressing on the base of the bladder. The seminal vesicles appear normal. Other: No pelvic mass or adenopathy. No free pelvic fluid collections. No inguinal mass or adenopathy. No abdominal wall hernia or subcutaneous lesions. Musculoskeletal: No significant bony findings. IMPRESSION: 1. Stable radiation changes involving the right hilum and right paramediastinal lung. No findings suspicious for residual or recurrent tumor. 2. Stable left upper lobe lesion.  No progressive findings. 3. Small scattered mediastinal lymph nodes are stable. No new or progressive findings. 4. No findings for metastatic disease involving the abdomen/pelvis or osseous structures. 5. Nephrolithiasis. 6. Three-vessel coronary artery calcifications. Electronically Signed   By: Marijo Sanes M.D.   On: 03/30/2019 18:20   Ct Abdomen Pelvis W Contrast  Result Date: 03/30/2019 CLINICAL DATA:  History of bilateral lung cancer. Status post radiation and chemotherapy. EXAM: CT CHEST, ABDOMEN, AND PELVIS  WITH CONTRAST TECHNIQUE: Multidetector CT imaging of the chest, abdomen and pelvis was performed following the standard protocol during bolus administration of intravenous contrast. CONTRAST:  164mL OMNIPAQUE IOHEXOL 300 MG/ML  SOLN COMPARISON:  CT scan 01/23/2019 FINDINGS: CT CHEST FINDINGS Cardiovascular: The heart is normal in size. No pericardial effusion. Advanced three-vessel coronary artery calcifications are noted. The Port-A-Cath is in good position. The aorta is normal in caliber. Moderate tortuosity but no aneurysm or dissection. The branch vessels are patent. Mediastinum/Nodes: No mediastinal or hilar mass or adenopathy. A few small scattered sub 8 mm mediastinal lymph nodes are stable. The esophagus is grossly normal. Lungs/Pleura: Stable radiation changes involving the right hilum and right paramediastinal lung with associated bronchiectasis. I do not see any findings suspicious for residual or recurrent tumor. Stable Y shaped nodular lesion in the left upper lobe on image number 69 measuring approximately 29 x 25 mm. No new pulmonary lesions or pulmonary nodules to suggest  metastatic disease. No pleural effusion or pleural nodules. Musculoskeletal: No chest wall mass, supraclavicular or axillary adenopathy. The bony structures are unremarkable. No findings suspicious for metastatic bone disease. CT ABDOMEN PELVIS FINDINGS Hepatobiliary: Stable simple hepatic cysts. No worrisome hepatic lesions to suggest metastatic disease. The gallbladder is mildly contracted. No common bile duct dilatation. Pancreas: No mass, inflammation or ductal dilatation. Spleen: Normal size.  No focal lesions. Adrenals/Urinary Tract: The adrenal glands are unremarkable. No worrisome adrenal gland lesions. Small bilateral renal calculi but no worrisome renal lesions or hydroureteronephrosis. The bladder appears normal. Stomach/Bowel: The stomach, duodenum, small bowel and colon are unremarkable. No acute inflammatory changes,  mass lesions or obstructive findings. The terminal ileum and appendix are normal. Descending and sigmoid colon diverticulosis without findings for acute diverticulitis. Vascular/Lymphatic: Moderate atherosclerotic calcifications involving the abdominal aorta and branch vessels but no aneurysm or dissection. The major venous structures are patent. No mesenteric or retroperitoneal mass or adenopathy. Reproductive: Mild prostate gland enlargement with median lobe hypertrophy impressing on the base of the bladder. The seminal vesicles appear normal. Other: No pelvic mass or adenopathy. No free pelvic fluid collections. No inguinal mass or adenopathy. No abdominal wall hernia or subcutaneous lesions. Musculoskeletal: No significant bony findings. IMPRESSION: 1. Stable radiation changes involving the right hilum and right paramediastinal lung. No findings suspicious for residual or recurrent tumor. 2. Stable left upper lobe lesion.  No progressive findings. 3. Small scattered mediastinal lymph nodes are stable. No new or progressive findings. 4. No findings for metastatic disease involving the abdomen/pelvis or osseous structures. 5. Nephrolithiasis. 6. Three-vessel coronary artery calcifications. Electronically Signed   By: Marijo Sanes M.D.   On: 03/30/2019 18:20     ASSESSMENT/PLAN:  This is a very pleasant 73 year old Caucasian male with a very light smoking history.  He was diagnosed with stage IV (T3, N0, M1C) non-small cell lung cancer, squamous cell carcinoma based on the biopsy of the left axillary mass.  He was diagnosed in August 2019.  He is status post 4 cycles of induction systemic chemotherapy with carboplatin, paclitaxel, and Keytruda with a partial response. The patient is currently undergoing maintenance immunotherapy with Keytruda 200 mg IV every 3 weeks.  He is status post 11 cycles.  He developed a skin rash after cycle #7 which resolved with a Medrol Dosepak and Benadryl. Otherwise, he is  tolerating treatment well without any adverse effects.   The patient recently had a restaging CT scan performed. Dr. Julien Nordmann personally and independently reviewed the scan and discussed the results with the patient today. The scan showed no evidence of disease progression. Dr. Julien Nordmann recommends continuing on the same treatment. He will proceed with cycle #12 today as scheduled.   We will see him back for a follow up visit in 3 weeks for evaluation before starting cycle #13.   The patient's synthroid dose was recently adjusted. We will continue to monitor his TSH on routine labs.   The patient will reach out to his orthopedic doctor, Dr. Veverly Fells, for evaluation regarding his shoulder pain. In the meantime, the patient will continue taking percocet for pain management. The patient knows to call the clinic should he need a new referral.  The patient' diarrhea has subsided at this time and was mild. I discussed that if the patient develops new or worsening diarrhea that he may try taking imodium to help control his symptoms.   The patient requested xanax for management of his anxiety and insomnia. I had a discussion with  the patient. I will send 15 tablets to the patient's pharmacy. I reinforced that this is an as needed medication. He expressed understanding.    No orders of the defined types were placed in this encounter.    Cassandra L Heilingoetter, PA-C 03/31/19  ADDENDUM: Hematology/Oncology Attending: I had a face-to-face encounter with the patient today.  I recommended his care plan.  This is a very pleasant 73 years old white male with metastatic non-small cell lung cancer, squamous cell carcinoma.  He is currently undergoing treatment with immunotherapy with Imfinzi status post 11 cycles and has been tolerating this treatment well with no concerning adverse effects. He had repeat CT scan of the chest, abdomen pelvis performed recently.  I personally and independently reviewed the scans  and discussed the results with the patient today. His scan showed no concerning findings for disease progression. I recommended for the patient to continue his current treatment with [facility] and he will proceed with cycle #12 today. For the persistent shoulder pain, the patient will see his orthopedic surgeon for evaluation and recommendation. He is currently on Percocet and will continue this for pain management until he sees his orthopedic surgeon. We will see him back for follow-up visit in 3 weeks for evaluation before the next cycle of his treatment. He was advised to call immediately if he has any concerning symptoms in the interval.  Disclaimer: This note was dictated with voice recognition software. Similar sounding words can inadvertently be transcribed and may be missed upon review. Eilleen Kempf, MD 03/31/19

## 2019-04-15 DIAGNOSIS — M7541 Impingement syndrome of right shoulder: Secondary | ICD-10-CM | POA: Diagnosis not present

## 2019-04-15 DIAGNOSIS — M25511 Pain in right shoulder: Secondary | ICD-10-CM | POA: Diagnosis not present

## 2019-04-15 DIAGNOSIS — M5136 Other intervertebral disc degeneration, lumbar region: Secondary | ICD-10-CM | POA: Diagnosis not present

## 2019-04-20 ENCOUNTER — Other Ambulatory Visit: Payer: Self-pay

## 2019-04-20 ENCOUNTER — Ambulatory Visit: Payer: Medicare Other | Attending: Orthopedic Surgery | Admitting: Physical Therapy

## 2019-04-20 ENCOUNTER — Encounter: Payer: Self-pay | Admitting: Physical Therapy

## 2019-04-20 DIAGNOSIS — M545 Low back pain, unspecified: Secondary | ICD-10-CM

## 2019-04-20 NOTE — Therapy (Signed)
[facility] [place], [place], [address] Phone: [phone]   Fax:  [phone]  Physical Therapy Evaluation  Patient Details  Name: Travis Padilla MRN: [id number] Date of Birth: Nov 18, 1945 Referring Provider (PT): [name] Plants MD.   Encounter Date: 04/20/2019  PT End of Session - 04/20/19 1253    Visit Number  1    Number of Visits  8    Date for PT Re-Evaluation  05/18/19    PT Start Time  1115    PT Stop Time  1152    PT Time Calculation (min)  37 min    Activity Tolerance  Patient tolerated treatment well    Behavior During Therapy  [facility] for tasks assessed/performed       Past Medical History:  Diagnosis Date  . Abnormal nuclear stress test    December, 2013  . Anemia   . CAD (coronary artery disease)    Mild nonobstructive plaque in cath 2013  . Chest pain    December, 2013  . Dizziness   . Gout   . Hemorrhoid   . Hyperlipidemia   . Hypertension   . Skin cancer   . Spinal stenosis     Past Surgical History:  Procedure Laterality Date  . 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 BELPHAROPTOSIS REPAIR Bilateral   . CATARACT EXTRACTION W/ INTRAOCULAR LENS  IMPLANT, BILATERAL    . COLONOSCOPY N/A 11/03/2014   Procedure: COLONOSCOPY;  Surgeon: Rogene Houston, MD;  Location: [facility];  Service: Endoscopy;  Laterality: N/A;  1225  . IR IMAGING GUIDED PORT INSERTION  07/11/2018  . KNEE CARTILAGE SURGERY Left    Left knee  . SKIN CANCER EXCISION  12/2014, 04/25/15  . VIDEO BRONCHOSCOPY Bilateral 05/09/2018   Procedure: VIDEO BRONCHOSCOPY WITH FLUORO;  Surgeon: Juanito Doom, MD;  Location: [facility];  Service: Cardiopulmonary;  Laterality: Bilateral;    There were no vitals filed for this visit.   Subjective Assessment - 04/20/19 1232    Subjective  COVID-19 screen performed prior to patient entering clinic.    Pertinent History  H/o neck and right shoulder pain, left knee, CAD, chemotherapy due  to lung cancer, hypothyroidism, spinal stenosis, left knee surgery.    Currently in Pain?  Yes    Pain Score  6     Pain Location  Back    Pain Orientation  Left;Lower    Pain Descriptors / Indicators  Aching    Pain Type  Acute pain    Pain Onset  More than a month ago    Pain Frequency  Constant    Aggravating Factors   Fatigue.    Pain Relieving Factors  Movement.         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    Referring Provider (PT)  [name] Plants MD.    Onset Date/Surgical Date  --   ~2 months ago.     Precautions   Precautions  None   Cancer and on chemotherapy.     Restrictions   Weight Bearing Restrictions  No      Balance Screen   Has the patient fallen in the past 6 months  No    Has the patient had a decrease in activity level because of a fear of falling?   Yes    Is the patient reluctant to  leave their home because of a fear of falling?   No      Home Environment   Living Environment  Private residence      Prior Function   Level of Independence  Independent      Posture/Postural Control   Posture/Postural Control  Postural limitations    Postural Limitations  Rounded Shoulders;Forward head      Deep Tendon Reflexes   DTR Assessment Site  Patella;Achilles    Patella DTR  0    Achilles DTR  0      ROM / Strength   AROM / PROM / Strength  AROM;Strength      AROM   Overall AROM Comments  Full active lumbar flexion and active extension= 20 degrees.      Strength   Overall Strength Comments  Normal bilateral LE strength.      Palpation   Palpation comment  Tender over region of left SIJ.      Special Tests   Other special tests  (=) leg lengths, (-) SLR, (-) FABER testing and (-) Sacral Press test.      Ambulation/Gait   Gait Comments  WNL.                Objective measurements completed on examination: See above findings.                    PT Long Term Goals - 04/20/19 1304      PT LONG TERM GOAL #1   Title  I with HEP    Time  4    Period  Weeks    Status  New             Plan - 04/20/19 1254    Clinical Impression Statement  The patient presents to OPPT with c/o left sided low back pain that has been ongoing for about 2 months.  His pain-level today is a 6/10.  His pain is localized in the area of his left SIJ.  Special testing was negative.  He is accustomed to exercising and is actually quite flexible.Patient will benefit from skilled physical therapy intervention to address pain    Personal Factors and Comorbidities  Comorbidity 1    Comorbidities  H/o neck and right shoulder pain, left knee, CAD, chemotherapy due to lung cancer, hypothyroidism, spinal stenosis, left knee surgery.    Stability/Clinical Decision Making  Evolving/Moderate complexity    Clinical Decision Making  Low    Rehab Potential  Excellent    PT Frequency  2x / week    PT Duration  4 weeks    PT Treatment/Interventions  Therapeutic activities;Therapeutic exercise    PT Next Visit Plan  S and DKTC, hip bridges, prone leg lifts, standing extension, prone press on wall, theraband scapular retraction, advance core exercise.  Foam roll on wall.    Consulted and Agree with Plan of Care  Patient       Patient will benefit from skilled therapeutic intervention in order to improve the following deficits and impairments:  Pain, Postural dysfunction, Decreased activity tolerance  Visit Diagnosis: 1. Acute left-sided low back pain without sciatica        Problem List Patient Active Problem List   Diagnosis Date Noted  . Shoulder pain 03/31/2019  . Port-A-Cath in place 08/11/2018  . Hypothyroidism (acquired) 08/11/2018  . Metastasis to lung ([place]) 05/19/2018  . Encounter for antineoplastic chemotherapy 05/13/2018  . Encounter for antineoplastic immunotherapy 05/13/2018  .  Goals of care,  counseling/discussion 05/13/2018  . 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[place]) 05/13/2018  . Axillary adenopathy 04/24/2018  . Right lower lobe lung mass 04/22/2018  . Cough with hemoptysis 04/22/2018  . Wheezing 04/02/2018  . Long-term use of high-risk medication 04/02/2018  . Dizziness 06/26/2017  . Fatigue 04/30/2016  . Insomnia 04/30/2016  . Hyperlipidemia 01/31/2016  . Ear pain 05/09/2015  . Anemia   . Abnormal nuclear stress test   . OBESITY, UNSPECIFIED 03/28/2009  . ESSENTIAL HYPERTENSION, BENIGN 03/28/2009  . PALPITATIONS 03/28/2009  . SNORING 03/28/2009    Kylani Wires, [place] MPT 04/20/2019, 1:07 PM  [facility] [address] [place], [place], [address] Phone: [phone]   Fax:  [phone]  Name: Travis Padilla MRN: [id number] Date of Birth: 02-Sep-1946

## 2019-04-21 ENCOUNTER — Inpatient Hospital Stay: Payer: Medicare Other

## 2019-04-21 ENCOUNTER — Inpatient Hospital Stay (HOSPITAL_BASED_OUTPATIENT_CLINIC_OR_DEPARTMENT_OTHER): Payer: Medicare Other | Admitting: Physician Assistant

## 2019-04-21 ENCOUNTER — Other Ambulatory Visit: Payer: Self-pay

## 2019-04-21 ENCOUNTER — Inpatient Hospital Stay: Payer: Medicare Other | Attending: Oncology

## 2019-04-21 VITALS — BP 122/78 | HR 67 | Temp 98.3°F | Resp 17 | Ht 73.0 in | Wt 229.3 lb

## 2019-04-21 DIAGNOSIS — Z95828 Presence of other vascular implants and grafts: Secondary | ICD-10-CM

## 2019-04-21 DIAGNOSIS — C3491 Malignant neoplasm of unspecified part of right bronchus or lung: Secondary | ICD-10-CM | POA: Diagnosis not present

## 2019-04-21 DIAGNOSIS — N400 Enlarged prostate without lower urinary tract symptoms: Secondary | ICD-10-CM | POA: Insufficient documentation

## 2019-04-21 DIAGNOSIS — C3412 Malignant neoplasm of upper lobe, left bronchus or lung: Secondary | ICD-10-CM | POA: Diagnosis not present

## 2019-04-21 DIAGNOSIS — Z5112 Encounter for antineoplastic immunotherapy: Secondary | ICD-10-CM

## 2019-04-21 DIAGNOSIS — Z85828 Personal history of other malignant neoplasm of skin: Secondary | ICD-10-CM | POA: Insufficient documentation

## 2019-04-21 DIAGNOSIS — Z9221 Personal history of antineoplastic chemotherapy: Secondary | ICD-10-CM | POA: Insufficient documentation

## 2019-04-21 DIAGNOSIS — I10 Essential (primary) hypertension: Secondary | ICD-10-CM | POA: Diagnosis not present

## 2019-04-21 DIAGNOSIS — C7801 Secondary malignant neoplasm of right lung: Secondary | ICD-10-CM

## 2019-04-21 DIAGNOSIS — Z923 Personal history of irradiation: Secondary | ICD-10-CM | POA: Diagnosis not present

## 2019-04-21 DIAGNOSIS — E032 Hypothyroidism due to medicaments and other exogenous substances: Secondary | ICD-10-CM

## 2019-04-21 DIAGNOSIS — E785 Hyperlipidemia, unspecified: Secondary | ICD-10-CM | POA: Insufficient documentation

## 2019-04-21 DIAGNOSIS — Z79899 Other long term (current) drug therapy: Secondary | ICD-10-CM | POA: Insufficient documentation

## 2019-04-21 LAB — CBC WITH DIFFERENTIAL (CANCER CENTER ONLY)
Abs Immature Granulocytes: 0.03 10*3/uL (ref 0.00–0.07)
Basophils Absolute: 0 10*3/uL (ref 0.0–0.1)
Basophils Relative: 1 %
Eosinophils Absolute: 0.1 10*3/uL (ref 0.0–0.5)
Eosinophils Relative: 2 %
HCT: 37 % — ABNORMAL LOW (ref 39.0–52.0)
Hemoglobin: 12.4 g/dL — ABNORMAL LOW (ref 13.0–17.0)
Immature Granulocytes: 1 %
Lymphocytes Relative: 17 %
Lymphs Abs: 1.1 10*3/uL (ref 0.7–4.0)
MCH: 30.8 pg (ref 26.0–34.0)
MCHC: 33.5 g/dL (ref 30.0–36.0)
MCV: 92 fL (ref 80.0–100.0)
Monocytes Absolute: 0.9 10*3/uL (ref 0.1–1.0)
Monocytes Relative: 13 %
Neutro Abs: 4.5 10*3/uL (ref 1.7–7.7)
Neutrophils Relative %: 66 %
Platelet Count: 158 10*3/uL (ref 150–400)
RBC: 4.02 MIL/uL — ABNORMAL LOW (ref 4.22–5.81)
RDW: 12 % (ref 11.5–15.5)
WBC Count: 6.7 10*3/uL (ref 4.0–10.5)
nRBC: 0 % (ref 0.0–0.2)

## 2019-04-21 LAB — CMP (CANCER CENTER ONLY)
ALT: 15 U/L (ref 0–44)
AST: 15 U/L (ref 15–41)
Albumin: 4.2 g/dL (ref 3.5–5.0)
Alkaline Phosphatase: 54 U/L (ref 38–126)
Anion gap: 6 (ref 5–15)
BUN: 21 mg/dL (ref 8–23)
CO2: 27 mmol/L (ref 22–32)
Calcium: 9.2 mg/dL (ref 8.9–10.3)
Chloride: 107 mmol/L (ref 98–111)
Creatinine: 1.02 mg/dL (ref 0.61–1.24)
GFR, Est AFR Am: >60 mL/min (ref >60)
GFR, Estimated: >60 mL/min (ref >60)
Glucose, Bld: 99 mg/dL (ref 70–99)
Potassium: 4.3 mmol/L (ref 3.5–5.1)
Sodium: 140 mmol/L (ref 135–145)
Total Bilirubin: 0.7 mg/dL (ref 0.3–1.2)
Total Protein: 7.8 g/dL (ref 6.5–8.1)

## 2019-04-21 LAB — TSH: TSH: 3.027 u[IU]/mL (ref 0.320–4.118)

## 2019-04-21 MED ORDER — SODIUM CHLORIDE 0.9% FLUSH
10.0000 mL | INTRAVENOUS | Status: DC | PRN
  Administered 2019-04-21: 14:00:00 10 mL
  Filled 2019-04-21: qty 10

## 2019-04-21 MED ORDER — SODIUM CHLORIDE 0.9% FLUSH
10.0000 mL | Freq: Once | INTRAVENOUS | Status: AC
  Administered 2019-04-21: 10 mL
  Filled 2019-04-21: qty 10

## 2019-04-21 MED ORDER — SODIUM CHLORIDE 0.9 % IV SOLN
200.0000 mg | Freq: Once | INTRAVENOUS | Status: AC
  Administered 2019-04-21: 200 mg via INTRAVENOUS
  Filled 2019-04-21: qty 8

## 2019-04-21 MED ORDER — LIDOCAINE-PRILOCAINE 2.5-2.5 % EX CREA: 1.0000 "application " | TOPICAL_CREAM | CUTANEOUS | 1 refills | Status: DC | PRN

## 2019-04-21 MED ORDER — HEPARIN SOD (PORK) LOCK FLUSH 100 UNIT/ML IV SOLN
500.0000 [IU] | Freq: Once | INTRAVENOUS | Status: AC | PRN
  Administered 2019-04-21: 500 [IU]
  Filled 2019-04-21: qty 5

## 2019-04-21 MED ORDER — SODIUM CHLORIDE 0.9 % IV SOLN
Freq: Once | INTRAVENOUS | Status: AC
  Administered 2019-04-21: 12:00:00 via INTRAVENOUS
  Filled 2019-04-21: qty 250

## 2019-04-21 NOTE — Progress Notes (Signed)
[facility] OFFICE PROGRESS NOTE  [name], [place] [address]  DIAGNOSIS: Stage IV (T3, N0, M1c) non-small cell lung cancer, squamous cell carcinoma presented with large right infrahilar mass in addition to left upper lobe lung nodule as well as left axillary mass with left axillary lymph node diagnosed in August 2019.  PRIOR THERAPY: 1) Palliative radiotherapy to the right infrahilar mass as well as the axillary mass under the care of Dr. Lisbeth Renshaw. 2) Systemic chemotherapy with carboplatin for AUC of 5, paclitaxel 175 mg/M2 and Keytruda 200 mg IV every 3 weeks status post 4 cycles.  CURRENT THERAPY: Maintenance immunotherapy with single agent Keytruda 200 mg IV every 3 weeks status post 13cycles.  INTERVAL HISTORY: Travis Padilla 73 y.o. male returns to the clinic for a follow-up visit.  The patient is feeling well today without any concerning complaints.  The patient recently met with his orthopedic physician regarding his right shoulder pain. He received a steroid injection with some relief of his symptoms.  He also started doing physical therapy.  Otherwise, he tolerated his last cycle of treatment with Keytruda well without any concerning adverse side effects.  He denies any fever, chills, night sweats, or weight loss.  He denies any chest pain, shortness of breath, cough, or hemoptysis.  He denies any nausea, vomiting, diarrhea, or constipation.  Denies any headache or visual changes.  Denies any rashes or skin changes.  He is here today for evaluation before starting cycle 14 of single agent Keytruda.   MEDICAL HISTORY: Past Medical History:  Diagnosis Date  . Abnormal nuclear stress test    December, 2013  . Anemia   . CAD (coronary artery disease)    Mild nonobstructive plaque in cath 2013  . Chest pain    December, 2013  . Dizziness   . Gout   . Hemorrhoid   . Hyperlipidemia   . Hypertension   . Skin cancer   . Spinal stenosis      ALLERGIES:  is allergic to trazodone and nefazodone.  MEDICATIONS:  Current Outpatient Medications  Medication Sig Dispense Refill  . Artificial Tear Solution (SOOTHE XP OP) Place 1 drop into both eyes 2 (two) times daily.    . [place]-Glucosamine-Vit D (OSTEO BI-FLEX ONE PER DAY PO) Take by mouth.    . carvedilol (COREG) 12.5 MG tablet TAKE 1 AND 1/2 TABLETS BY MOUTH TWO TIMES DAILY WITH A MEAL 90 tablet 0  . diphenhydramine-acetaminophen (TYLENOL PM) 25-500 MG TABS tablet Take 1-2 tablets by mouth at bedtime as needed (pain).    Marland Kitchen levothyroxine (SYNTHROID) 100 MCG tablet Take 1 tablet (100 mcg total) by mouth daily before breakfast. 30 tablet 1  . lidocaine-prilocaine (EMLA) cream Apply 1 application topically as needed. 30 g 1  . Multiple Vitamins-Minerals (PRESERVISION AREDS 2 PO) Take by mouth.    . oxyCODONE-acetaminophen (PERCOCET/ROXICET) 5-325 MG tablet Take 1 tablet by mouth every 8 (eight) hours as needed for severe pain. 30 tablet 0  . pravastatin (PRAVACHOL) 40 MG tablet TAKE 1 TABLET BY MOUTH EVERY DAY (Patient taking differently: Take 40 mg by mouth at bedtime. ) 90 tablet 3  . senna-docusate (SENNA S) 8.6-50 MG tablet 1 to 2 twice daily for constipation 120 tablet 5  . albuterol (PROVENTIL) (2.5 MG/3ML) 0.083% nebulizer solution Take 3 mLs (2.5 mg total) by nebulization every 6 (six) hours as needed for wheezing or shortness of breath. (Patient not taking: Reported on 04/21/2019)  75 mL 12  . ALPRAZolam (XANAX) 0.25 MG tablet Take 1 tablet (0.25 mg total) by mouth at bedtime as needed for anxiety. (Patient not taking: Reported on 04/21/2019) 15 tablet 0  . mirtazapine (REMERON) 15 MG tablet TAKE 1 TABLET BY MOUTH EVERYDAY AT BEDTIME (Patient not taking: Reported on 03/31/2019) 90 tablet 1  . prochlorperazine (COMPAZINE) 10 MG tablet Take 1 tablet (10 mg total) by mouth every 6 (six) hours as needed for nausea or vomiting. (Patient not taking: Reported on 04/21/2019) 30 tablet 1    No current facility-administered medications for this visit.    Facility-Administered Medications Ordered in Other Visits  Medication Dose Route Frequency Provider Last Rate Last Dose  . heparin lock flush 100 unit/mL  500 Units Intracatheter Once PRN Curt Bears, MD      . pembrolizumab [facility]) 200 mg in sodium chloride 0.9 % 50 mL chemo infusion  200 mg Intravenous Once Curt Bears, MD      . sodium chloride flush (NS) 0.9 % injection 10 mL  10 mL Intracatheter PRN Curt Bears, MD        SURGICAL HISTORY:  Past Surgical History:  Procedure Laterality Date  . 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 BELPHAROPTOSIS REPAIR Bilateral   . CATARACT EXTRACTION W/ INTRAOCULAR LENS  IMPLANT, BILATERAL    . COLONOSCOPY N/A 11/03/2014   Procedure: COLONOSCOPY;  Surgeon: Rogene Houston, MD;  [facility];  Service: Endoscopy;  Laterality: N/A;  1225  . IR IMAGING GUIDED PORT INSERTION  07/11/2018  . KNEE CARTILAGE SURGERY Left    Left knee  . SKIN CANCER EXCISION  12/2014, 04/25/15  . VIDEO BRONCHOSCOPY Bilateral 05/09/2018   Procedure: VIDEO BRONCHOSCOPY WITH FLUORO;  Surgeon: Juanito Doom, MD;  [facility];  Service: Cardiopulmonary;  Laterality: Bilateral;    REVIEW OF SYSTEMS:   Review of Systems  Constitutional: Negative for appetite change, chills, fatigue, fever and unexpected weight change.  HENT:   Negative for mouth sores, nosebleeds, sore throat and trouble swallowing.   Eyes: Negative for eye problems and icterus.  Respiratory: Negative for cough, hemoptysis, shortness of breath and wheezing.   Cardiovascular: Negative for chest pain and leg swelling.  Gastrointestinal: Negative for abdominal pain, constipation, diarrhea, nausea and vomiting.  Genitourinary: Negative for bladder incontinence, difficulty urinating, dysuria, frequency and hematuria.   Musculoskeletal: Negative for back pain, gait problem, neck pain and neck stiffness.   Skin: Negative for itching and rash.  Neurological: Negative for dizziness, extremity weakness, gait problem, headaches, light-headedness and seizures.  Hematological: Negative for adenopathy. Does not bruise/bleed easily.  Psychiatric/Behavioral: Negative for confusion, depression and sleep disturbance. The patient is not nervous/anxious.     PHYSICAL EXAMINATION:  Blood pressure 122/78, pulse 67, temperature 98.3 F (36.8 C), temperature source Temporal, resp. rate 17, height '6\' 1"'  (1.854 m), weight 229 lb 5 oz (104 kg), SpO2 99 %.  ECOG PERFORMANCE STATUS: 1 - Symptomatic but completely ambulatory  Physical Exam  Constitutional: Oriented to person, place, and time and well-developed, well-nourished, and in no distress.  HENT:  Head: Normocephalic and atraumatic.  Mouth/Throat: Oropharynx is clear and moist. No oropharyngeal exudate.  Eyes: Conjunctivae are normal. Right eye exhibits no discharge. Left eye exhibits no discharge. No scleral icterus.  Neck: Normal range of motion. Neck supple.  Cardiovascular: Normal rate, regular rhythm, normal heart sounds and intact distal pulses.   Pulmonary/Chest: Effort normal and breath sounds normal. No respiratory distress. No  wheezes. No rales.  Abdominal: Soft. Bowel sounds are normal. Exhibits no distension and no mass. There is no tenderness.  Musculoskeletal: Normal range of motion. Exhibits no edema.  Lymphadenopathy:    No cervical adenopathy.  Neurological: Alert and oriented to person, place, and time. Exhibits normal muscle tone. Gait normal. Coordination normal.  Skin: Skin is warm and dry. No rash noted. Not diaphoretic. No erythema. No pallor.  Psychiatric: Mood, memory and judgment normal.  Vitals reviewed.  LABORATORY DATA: Lab Results  Component Value Date   WBC 6.7 04/21/2019   HGB 12.4 (L) 04/21/2019   HCT 37.0 (L) 04/21/2019   MCV 92.0 04/21/2019   PLT 158 04/21/2019      Chemistry      Component Value Date/Time    NA 140 04/21/2019 1018   NA 144 04/09/2018 1507   K 4.3 04/21/2019 1018   CL 107 04/21/2019 1018   CO2 27 04/21/2019 1018   BUN 21 04/21/2019 1018   BUN 15 04/09/2018 1507   CREATININE 1.02 04/21/2019 1018      Component Value Date/Time   CALCIUM 9.2 04/21/2019 1018   ALKPHOS 54 04/21/2019 1018   AST 15 04/21/2019 1018   ALT 15 04/21/2019 1018   BILITOT 0.7 04/21/2019 1018       RADIOGRAPHIC STUDIES:  Ct Chest W Contrast  Result Date: 03/30/2019 CLINICAL DATA:  History of bilateral lung cancer. Status post radiation and chemotherapy. EXAM: CT CHEST, ABDOMEN, AND PELVIS WITH CONTRAST TECHNIQUE: Multidetector CT imaging of the chest, abdomen and pelvis was performed following the standard protocol during bolus administration of intravenous contrast. CONTRAST:  126m OMNIPAQUE IOHEXOL 300 MG/ML  SOLN COMPARISON:  CT scan 01/23/2019 FINDINGS: CT CHEST FINDINGS Cardiovascular: The heart is normal in size. No pericardial effusion. Advanced three-vessel coronary artery calcifications are noted. The Port-A-Cath is in good position. The aorta is normal in caliber. Moderate tortuosity but no aneurysm or dissection. The branch vessels are patent. Mediastinum/Nodes: No mediastinal or hilar mass or adenopathy. A few small scattered sub 8 mm mediastinal lymph nodes are stable. The esophagus is grossly normal. Lungs/Pleura: Stable radiation changes involving the right hilum and right paramediastinal lung with associated bronchiectasis. I do not see any findings suspicious for residual or recurrent tumor. Stable Y shaped nodular lesion in the left upper lobe on image number 69 measuring approximately 29 x 25 mm. No new pulmonary lesions or pulmonary nodules to suggest metastatic disease. No pleural effusion or pleural nodules. Musculoskeletal: No chest wall mass, supraclavicular or axillary adenopathy. The bony structures are unremarkable. No findings suspicious for metastatic bone disease. CT ABDOMEN  PELVIS FINDINGS Hepatobiliary: Stable simple hepatic cysts. No worrisome hepatic lesions to suggest metastatic disease. The gallbladder is mildly contracted. No common bile duct dilatation. Pancreas: No mass, inflammation or ductal dilatation. Spleen: Normal size.  No focal lesions. Adrenals/Urinary Tract: The adrenal glands are unremarkable. No worrisome adrenal gland lesions. Small bilateral renal calculi but no worrisome renal lesions or hydroureteronephrosis. The bladder appears normal. Stomach/Bowel: The stomach, duodenum, small bowel and colon are unremarkable. No acute inflammatory changes, mass lesions or obstructive findings. The terminal ileum and appendix are normal. Descending and sigmoid colon diverticulosis without findings for acute diverticulitis. Vascular/Lymphatic: Moderate atherosclerotic calcifications involving the abdominal aorta and branch vessels but no aneurysm or dissection. The major venous structures are patent. No mesenteric or retroperitoneal mass or adenopathy. Reproductive: Mild prostate gland enlargement with median lobe hypertrophy impressing on the base of the bladder. The seminal vesicles  appear normal. Other: No pelvic mass or adenopathy. No free pelvic fluid collections. No inguinal mass or adenopathy. No abdominal wall hernia or subcutaneous lesions. Musculoskeletal: No significant bony findings. IMPRESSION: 1. Stable radiation changes involving the right hilum and right paramediastinal lung. No findings suspicious for residual or recurrent tumor. 2. Stable left upper lobe lesion.  No progressive findings. 3. Small scattered mediastinal lymph nodes are stable. No new or progressive findings. 4. No findings for metastatic disease involving the abdomen/pelvis or osseous structures. 5. Nephrolithiasis. 6. Three-vessel coronary artery calcifications. Electronically Signed   By: Marijo Sanes M.D.   On: 03/30/2019 18:20   Ct Abdomen Pelvis W Contrast  Result Date:  03/30/2019 CLINICAL DATA:  History of bilateral lung cancer. Status post radiation and chemotherapy. EXAM: CT CHEST, ABDOMEN, AND PELVIS WITH CONTRAST TECHNIQUE: Multidetector CT imaging of the chest, abdomen and pelvis was performed following the standard protocol during bolus administration of intravenous contrast. CONTRAST:  157m OMNIPAQUE IOHEXOL 300 MG/ML  SOLN COMPARISON:  CT scan 01/23/2019 FINDINGS: CT CHEST FINDINGS Cardiovascular: The heart is normal in size. No pericardial effusion. Advanced three-vessel coronary artery calcifications are noted. The Port-A-Cath is in good position. The aorta is normal in caliber. Moderate tortuosity but no aneurysm or dissection. The branch vessels are patent. Mediastinum/Nodes: No mediastinal or hilar mass or adenopathy. A few small scattered sub 8 mm mediastinal lymph nodes are stable. The esophagus is grossly normal. Lungs/Pleura: Stable radiation changes involving the right hilum and right paramediastinal lung with associated bronchiectasis. I do not see any findings suspicious for residual or recurrent tumor. Stable Y shaped nodular lesion in the left upper lobe on image number 69 measuring approximately 29 x 25 mm. No new pulmonary lesions or pulmonary nodules to suggest metastatic disease. No pleural effusion or pleural nodules. Musculoskeletal: No chest wall mass, supraclavicular or axillary adenopathy. The bony structures are unremarkable. No findings suspicious for metastatic bone disease. CT ABDOMEN PELVIS FINDINGS Hepatobiliary: Stable simple hepatic cysts. No worrisome hepatic lesions to suggest metastatic disease. The gallbladder is mildly contracted. No common bile duct dilatation. Pancreas: No mass, inflammation or ductal dilatation. Spleen: Normal size.  No focal lesions. Adrenals/Urinary Tract: The adrenal glands are unremarkable. No worrisome adrenal gland lesions. Small bilateral renal calculi but no worrisome renal lesions or hydroureteronephrosis.  The bladder appears normal. Stomach/Bowel: The stomach, duodenum, small bowel and colon are unremarkable. No acute inflammatory changes, mass lesions or obstructive findings. The terminal ileum and appendix are normal. Descending and sigmoid colon diverticulosis without findings for acute diverticulitis. Vascular/Lymphatic: Moderate atherosclerotic calcifications involving the abdominal aorta and branch vessels but no aneurysm or dissection. The major venous structures are patent. No mesenteric or retroperitoneal mass or adenopathy. Reproductive: Mild prostate gland enlargement with median lobe hypertrophy impressing on the base of the bladder. The seminal vesicles appear normal. Other: No pelvic mass or adenopathy. No free pelvic fluid collections. No inguinal mass or adenopathy. No abdominal wall hernia or subcutaneous lesions. Musculoskeletal: No significant bony findings. IMPRESSION: 1. Stable radiation changes involving the right hilum and right paramediastinal lung. No findings suspicious for residual or recurrent tumor. 2. Stable left upper lobe lesion.  No progressive findings. 3. Small scattered mediastinal lymph nodes are stable. No new or progressive findings. 4. No findings for metastatic disease involving the abdomen/pelvis or osseous structures. 5. Nephrolithiasis. 6. Three-vessel coronary artery calcifications. Electronically Signed   By: PMarijo SanesM.D.   On: 03/30/2019 18:20     ASSESSMENT/PLAN:  This is a  very pleasant 73 year old Caucasian male with a very light smoking history. He was diagnosed with stage IV (T3, N0, M1C) non-small cell lung cancer, squamous cell carcinoma based on the biopsy of the left axillary mass. He was diagnosed in August 2019.   He is status post 4 cycles of induction systemic chemotherapy with carboplatin, paclitaxel, and Keytruda with a partial response. The patient is currently undergoing maintenance immunotherapy with Keytruda 200 mg IV every 3 weeks. He  is status post 13 cycles of single agent Keytruda. He developed a skin rash after cycle #7 which resolved with a Medrol Dosepak and Benadryl. Otherwise, he is tolerating treatment well without any adverse effects.   Labs were reviewed with the patient. I recommend that he proceed with cycle #14 of single agent Keytruda today.   We will see him back for follow-up visit in 3 weeks for evaluation before starting cycle #15 of single agent Keytruda.  I sent a refill of the patient's EMLA cream to his pharmacy.  The patient will continue undergoing physical therapy for his right shoulder pain.  He also will continue with his pain management regimen with Percocet as needed.   His synthroid dose was recently adjusted.  We will continue to monitor his TSH on routine labs and adjust his dose as needed.  The patient was advised to call immediately if he has any concerning symptoms in the interval. The patient voices understanding of current disease status and treatment options and is in agreement with the current care plan. All questions were answered. The patient knows to call the clinic with any problems, questions or concerns. We can certainly see the patient much sooner if necessary  No orders of the defined types were placed in this encounter.    Cassandra L Heilingoetter, PA-C 04/21/19

## 2019-04-21 NOTE — Patient Instructions (Signed)
Coronavirus (COVID-19) Are you at risk?  Are you at risk for the Coronavirus (COVID-19)?  To be considered HIGH RISK for Coronavirus (COVID-19), you have to meet the following criteria:  . Traveled to [place], [place], [place], [place] or [place]; or in the [place] to [place], [place], [place], or [place]; and have fever, cough, and shortness of breath within the last 2 weeks of travel OR . Been in close contact with a person diagnosed with COVID-19 within the last 2 weeks and have fever, cough, and shortness of breath . IF YOU DO NOT MEET THESE CRITERIA, YOU ARE CONSIDERED LOW RISK FOR COVID-19.  What to do if you are HIGH RISK for COVID-19?  . If you are having a medical emergency, call 911. . Seek medical care right away. Before you go to a doctor's office, urgent care or emergency department, call ahead and tell them about your recent travel, contact with someone diagnosed with COVID-19, and your symptoms. You should receive instructions from your physician's office regarding next steps of care.  . When you arrive at healthcare provider, tell the healthcare staff immediately you have returned from visiting [place], [place], [place], [place] or [place]; or traveled in the [place] to [place], [place], [place], or [place]; in the last two weeks or you have been in close contact with a person diagnosed with COVID-19 in the last 2 weeks.   . Tell the health care staff about your symptoms: fever, cough and shortness of breath. . After you have been seen by a medical provider, you will be either: o Tested for (COVID-19) and discharged home on quarantine except to seek medical care if symptoms worsen, and asked to  - Stay home and avoid contact with others until you get your results (4-5 days)  - Avoid travel on public transportation if possible (such as bus, train, or airplane) or o Sent to the Emergency Department by EMS for evaluation, COVID-19 testing, and possible  admission depending on your condition and test results.  What to do if you are LOW RISK for COVID-19?  Reduce your risk of any infection by using the same precautions used for avoiding the common cold or flu:  . Wash your hands often with soap and warm water for at least 20 seconds.  If soap and water are not readily available, use an alcohol-based hand sanitizer with at least 60% alcohol.  . If coughing or sneezing, cover your mouth and nose by coughing or sneezing into the elbow areas of your shirt or coat, into a tissue or into your sleeve (not your hands). . Avoid shaking hands with others and consider head nods or verbal greetings only. . Avoid touching your eyes, nose, or mouth with unwashed hands.  . Avoid close contact with people who are sick. . Avoid places or events with large numbers of people in one location, like concerts or sporting events. . Carefully consider travel plans you have or are making. . If you are planning any travel outside or inside the [place], visit the CDC's Travelers' Health webpage for the latest health notices. . If you have some symptoms but not all symptoms, continue to monitor at home and seek medical attention if your symptoms worsen. . If you are having a medical emergency, call 911.   ADDITIONAL HEALTHCARE OPTIONS FOR PATIENTS  [facility] / e-Visit: [email or web address]         [facility]: [phone]  [facility]: [phone]                   [facility]: [phone]   [facility] Discharge Instructions for Patients Receiving Chemotherapy  Today you received the following chemotherapy agents Keytruda  To help prevent nausea and vomiting after your treatment, we encourage you to take your nausea medication as directed.    If you develop nausea and vomiting that is not controlled by your nausea medication, call the clinic.   BELOW ARE  SYMPTOMS THAT SHOULD BE REPORTED IMMEDIATELY:  *FEVER GREATER THAN 100.5 F  *CHILLS WITH OR WITHOUT FEVER  NAUSEA AND VOMITING THAT IS NOT CONTROLLED WITH YOUR NAUSEA MEDICATION  *UNUSUAL SHORTNESS OF BREATH  *UNUSUAL BRUISING OR BLEEDING  TENDERNESS IN MOUTH AND THROAT WITH OR WITHOUT PRESENCE OF ULCERS  *URINARY PROBLEMS  *BOWEL PROBLEMS  UNUSUAL RASH Items with * indicate a potential emergency and should be followed up as soon as possible.  Feel free to call the clinic should you have any questions or concerns. The clinic phone number is [phone].  Please show the CHEMO ALERT CARD at check-in to the Emergency Department and triage nurse.   

## 2019-04-23 ENCOUNTER — Other Ambulatory Visit: Payer: Self-pay

## 2019-04-23 ENCOUNTER — Ambulatory Visit: Payer: Medicare Other | Admitting: *Deleted

## 2019-04-23 DIAGNOSIS — M545 Low back pain, unspecified: Secondary | ICD-10-CM

## 2019-04-23 NOTE — Therapy (Signed)
[facility] [place], [place], [address] Phone: [phone]   Fax:  [phone]  Physical Therapy Treatment  Patient Details  Name: Travis Padilla MRN: [id number] Date of Birth: Feb 22, 1946 Referring Provider (PT): [name] Plants MD.   Encounter Date: 04/23/2019  PT End of Session - 04/23/19 1707    Visit Number  2    Number of Visits  8    Date for PT Re-Evaluation  05/18/19    PT Start Time  1300    PT Stop Time  1345    PT Time Calculation (min)  45 min       Past Medical History:  Diagnosis Date  . Abnormal nuclear stress test    December, 2013  . Anemia   . CAD (coronary artery disease)    Mild nonobstructive plaque in cath 2013  . Chest pain    December, 2013  . Dizziness   . Gout   . Hemorrhoid   . Hyperlipidemia   . Hypertension   . Skin cancer   . Spinal stenosis     Past Surgical History:  Procedure Laterality Date  . 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 BELPHAROPTOSIS REPAIR Bilateral   . CATARACT EXTRACTION W/ INTRAOCULAR LENS  IMPLANT, BILATERAL    . COLONOSCOPY N/A 11/03/2014   Procedure: COLONOSCOPY;  Surgeon: Rogene Houston, MD;  [facility];  Service: Endoscopy;  Laterality: N/A;  1225  . IR IMAGING GUIDED PORT INSERTION  07/11/2018  . KNEE CARTILAGE SURGERY Left    Left knee  . SKIN CANCER EXCISION  12/2014, 04/25/15  . VIDEO BRONCHOSCOPY Bilateral 05/09/2018   Procedure: VIDEO BRONCHOSCOPY WITH FLUORO;  Surgeon: Juanito Doom, MD;  Location: [facility];  Service: Cardiopulmonary;  Laterality: Bilateral;    There were no vitals filed for this visit.  Subjective Assessment - 04/23/19 1301    Subjective  COVID-19 screen performed prior to patient entering clinic.   doing Ok    Pertinent History  H/o neck and right shoulder pain, left knee, CAD, chemotherapy due to lung cancer, hypothyroidism, spinal stenosis, left knee surgery.    Currently in Pain?  Yes    Pain Score   3     Pain Location  Back    Pain Orientation  Lower    Pain Descriptors / Indicators  Sore;Aching    Pain Onset  More than a month ago                       [facility] Adult PT Treatment/Exercise - 04/23/19 0001      Exercises   Exercises  Lumbar      Lumbar Exercises: Aerobic   Nustep  L4 x 10 mins      Lumbar Exercises: Standing   Row  Strengthening;Both;5 reps    Other Standing Lumbar Exercises  standing extension      Lumbar Exercises: Supine   Ab Set  15 reps;5 seconds    Bent Knee Raise  3 seconds;15 reps    Dead Bug  10 reps;3 seconds    Bridge  20 reps;5 seconds      Lumbar Exercises: Prone   Straight Leg Raise  10 reps;3 seconds      Lumbar Exercises: Quadruped   Madcat/Old Horse  10 reps    Straight Leg Raise  10 reps;3 seconds             PT Education -  04/23/19 1706    Education Details  core exs    Person(s) Educated  Patient    Methods  Explanation;Demonstration;Tactile cues;Verbal cues;Handout    Comprehension  Verbalized understanding;Returned demonstration;Verbal cues required;Tactile cues required;Need further instruction          PT Long Term Goals - 04/20/19 1304      PT LONG TERM GOAL #1   Title  I with HEP    Time  4    Period  Weeks    Status  New            Plan - 04/23/19 1307    Clinical Impression Statement  Pt arrived today doing fairly well and was able to perform all LB exs for HEP with minimal complaints except for prone leg raise. QP leg raise was performed and replaced prone Leg raise. HEP sheet given.    Personal Factors and Comorbidities  Comorbidity 1    Comorbidities  H/o neck and right shoulder pain, left knee, CAD, chemotherapy due to lung cancer, hypothyroidism, spinal stenosis, left knee surgery.    Stability/Clinical Decision Making  Evolving/Moderate complexity    Rehab Potential  Excellent    PT Frequency  2x / week    PT Duration  4 weeks    PT Treatment/Interventions  Therapeutic  activities;Therapeutic exercise    PT Next Visit Plan  S and DKTC, hip bridges, prone leg lifts, standing extension, prone press on wall, theraband scapular retraction, advance core exercise.  Foam roll on wall.       Patient will benefit from skilled therapeutic intervention in order to improve the following deficits and impairments:  Pain, Postural dysfunction, Decreased activity tolerance  Visit Diagnosis: 1. Acute left-sided low back pain without sciatica        Problem List Patient Active Problem List   Diagnosis Date Noted  . Shoulder pain 03/31/2019  . Port-A-Cath in place 08/11/2018  . Hypothyroidism (acquired) 08/11/2018  . Metastasis to lung ([place]) 05/19/2018  . Encounter for antineoplastic chemotherapy 05/13/2018  . Encounter for antineoplastic immunotherapy 05/13/2018  . Goals of care, counseling/discussion 05/13/2018  . 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 Axillary adenopathy 04/24/2018  . Right lower lobe lung mass 04/22/2018  . Cough with hemoptysis 04/22/2018  . Wheezing 04/02/2018  . Long-term use of high-risk medication 04/02/2018  . Dizziness 06/26/2017  . Fatigue 04/30/2016  . Insomnia 04/30/2016  . Hyperlipidemia 01/31/2016  . Ear pain 05/09/2015  . Anemia   . Abnormal nuclear stress test   . OBESITY, UNSPECIFIED 03/28/2009  . ESSENTIAL HYPERTENSION, BENIGN 03/28/2009  . PALPITATIONS 03/28/2009  . SNORING 03/28/2009    RAMSEUR,CHRIS, PTA 04/23/2019, 5:12 PM  [facility] [address] [place], [place], [address] Phone: [phone]   Fax:  [phone]  Name: Travis Padilla MRN: [id number] Date of Birth: 1945-12-05

## 2019-04-28 ENCOUNTER — Other Ambulatory Visit: Payer: Self-pay | Admitting: Internal Medicine

## 2019-04-28 DIAGNOSIS — C3491 Malignant neoplasm of unspecified part of right bronchus or lung: Secondary | ICD-10-CM

## 2019-04-30 ENCOUNTER — Other Ambulatory Visit: Payer: Self-pay

## 2019-04-30 ENCOUNTER — Ambulatory Visit: Payer: Medicare Other | Admitting: *Deleted

## 2019-04-30 DIAGNOSIS — M545 Low back pain, unspecified: Secondary | ICD-10-CM

## 2019-04-30 NOTE — Therapy (Signed)
[facility] [place], [place], [address] Phone: [phone]   Fax:  [phone]  Physical Therapy Treatment  Patient Details  Name: Travis Padilla MRN: [id number] Date of Birth: [birth year] Referring Provider (PT): [name] Plants MD.   Encounter Date: 04/30/2019  PT End of Session - 04/30/19 1121    Visit Number  3    Number of Visits  8    Date for PT Re-Evaluation  05/18/19    PT Start Time  1115    PT Stop Time  1200    PT Time Calculation (min)  45 min       Past Medical History:  Diagnosis Date  . Abnormal nuclear stress test    December, 2013  . Anemia   . CAD (coronary artery disease)    Mild nonobstructive plaque in cath 2013  . Chest pain    December, 2013  . Dizziness   . Gout   . Hemorrhoid   . Hyperlipidemia   . Hypertension   . Skin cancer   . Spinal stenosis     Past Surgical History:  Procedure Laterality Date  . 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 BELPHAROPTOSIS REPAIR Bilateral   . CATARACT EXTRACTION W/ INTRAOCULAR LENS  IMPLANT, BILATERAL    . COLONOSCOPY N/A 11/03/2014   Procedure: COLONOSCOPY;  Surgeon: Rogene Houston, MD;  [facility];  Service: Endoscopy;  Laterality: N/A;  1225  . IR IMAGING GUIDED PORT INSERTION  07/11/2018  . KNEE CARTILAGE SURGERY Left    Left knee  . SKIN CANCER EXCISION  12/2014, 04/25/15  . VIDEO BRONCHOSCOPY Bilateral 05/09/2018   Procedure: VIDEO BRONCHOSCOPY WITH FLUORO;  Surgeon: Juanito Doom, MD;  [facility];  Service: Cardiopulmonary;  Laterality: Bilateral;    There were no vitals filed for this visit.  Subjective Assessment - 04/30/19 1118    Subjective  COVID-19 screen performed prior to patient entering clinic. I tried the exs and did well except the one on my stomach still flairs me up.    Pertinent History  H/o neck and right shoulder pain, left knee, CAD, chemotherapy due to lung cancer, hypothyroidism, spinal  stenosis, left knee surgery.    Currently in Pain?  Yes    Pain Score  3    Certain positions spike pain to 8/10   Pain Location  Back    Pain Orientation  Lower    Pain Descriptors / Indicators  Sore;Aching;Squeezing    Pain Type  Acute pain    Pain Onset  More than a month ago                       [facility] Adult PT Treatment/Exercise - 04/30/19 0001      Exercises   Exercises  Lumbar      Lumbar Exercises: Aerobic   Nustep  L4 x 10 mins      Lumbar Exercises: Standing   Row  --    Other Standing Lumbar Exercises  standing extension    Other Standing Lumbar Exercises  Standing XTS orange band - Rows 4x10, and extension 3x10.      Lumbar Exercises: Supine   Ab Set  15 reps;5 seconds    Bent Knee Raise  3 seconds;15 reps    Dead Bug  10 reps;3 seconds    Bridge  20 reps;5 seconds      Lumbar Exercises: Quadruped   Madcat/Old  Horse  10 reps    Straight Leg Raise  10 reps;3 seconds      Manual Therapy   Manual therapy comments  Instruction in use of theracane and a therapy ball for STW/ TPR for RT shldr blade                  PT Long Term Goals - 04/20/19 1304      PT LONG TERM GOAL #1   Title  I with HEP    Time  4    Period  Weeks    Status  New            Plan - 04/30/19 1125    Clinical Impression Statement  Pt arrived today doing fair, but c/o a pain that is 8/10 around his LT shldr blade and  comes on certain shldr  positions. Pt was advised to not perform exs that cause this pain (prone leg raise). He did well with Rx today and has a good understanding about not pushing through exs that cause pain.  Lat pull down and Rows added to HEP with pictures  and green tband given Pt was instructed in STW/TPR with therapy ball and thera-cane for TPR along LT shldr blade..    Comorbidities  H/o neck and right shoulder pain, left knee, CAD, chemotherapy due to lung cancer, hypothyroidism, spinal stenosis, left knee surgery.    Rehab Potential   Excellent    PT Frequency  2x / week    PT Treatment/Interventions  Therapeutic activities;Therapeutic exercise    PT Next Visit Plan  S and DKTC, hip bridges, prone leg lifts, standing extension, prone press on wall, theraband scapular retraction, advance core exercise.  Foam roll on wall.       Patient will benefit from skilled therapeutic intervention in order to improve the following deficits and impairments:  Pain, Postural dysfunction, Decreased activity tolerance  Visit Diagnosis: Acute left-sided low back pain without sciatica     Problem List Patient Active Problem List   Diagnosis Date Noted  . Shoulder pain 03/31/2019  . Port-A-Cath in place 08/11/2018  . Hypothyroidism (acquired) 08/11/2018  . Metastasis to lung ([place]) 05/19/2018  . Encounter for antineoplastic chemotherapy 05/13/2018  . Encounter for antineoplastic immunotherapy 05/13/2018  . Goals of care, counseling/discussion 05/13/2018  . 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 Axillary adenopathy 04/24/2018  . Right lower lobe lung mass 04/22/2018  . Cough with hemoptysis 04/22/2018  . Wheezing 04/02/2018  . Long-term use of high-risk medication 04/02/2018  . Dizziness 06/26/2017  . Fatigue 04/30/2016  . Insomnia 04/30/2016  . Hyperlipidemia 01/31/2016  . Ear pain 05/09/2015  . Anemia   . Abnormal nuclear stress test   . OBESITY, UNSPECIFIED 03/28/2009  . ESSENTIAL HYPERTENSION, BENIGN 03/28/2009  . PALPITATIONS 03/28/2009  . SNORING 03/28/2009    Sharronda Schweers,CHRIS, PTA 04/30/2019, 1:40 PM  [facility], [place], [address] Phone: [phone]   Fax:  [phone]  Name: Travis Padilla MRN: [id number] Date of Birth: May 08, 1946

## 2019-04-30 NOTE — Therapy (Signed)
[facility] [place], [place], [address] Phone: [phone]   Fax:  [phone]  Physical Therapy Treatment  Patient Details  Name: Travis Padilla MRN: [id number] Date of Birth: 03-19-1946 Referring Provider (PT): [name] Plants MD.   Encounter Date: 04/30/2019  PT End of Session - 04/30/19 1121    Visit Number  3    Number of Visits  8    Date for PT Re-Evaluation  05/18/19    PT Start Time  1115    PT Stop Time  1200    PT Time Calculation (min)  45 min       Past Medical History:  Diagnosis Date  . Abnormal nuclear stress test    December, 2013  . Anemia   . CAD (coronary artery disease)    Mild nonobstructive plaque in cath 2013  . Chest pain    December, 2013  . Dizziness   . Gout   . Hemorrhoid   . Hyperlipidemia   . Hypertension   . Skin cancer   . Spinal stenosis     Past Surgical History:  Procedure Laterality Date  . 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 BELPHAROPTOSIS REPAIR Bilateral   . CATARACT EXTRACTION W/ INTRAOCULAR LENS  IMPLANT, BILATERAL    . COLONOSCOPY N/A 11/03/2014   Procedure: COLONOSCOPY;  Surgeon: Rogene Houston, MD;  [facility];  Service: Endoscopy;  Laterality: N/A;  1225  . IR IMAGING GUIDED PORT INSERTION  07/11/2018  . KNEE CARTILAGE SURGERY Left    Left knee  . SKIN CANCER EXCISION  12/2014, 04/25/15  . VIDEO BRONCHOSCOPY Bilateral 05/09/2018   Procedure: VIDEO BRONCHOSCOPY WITH FLUORO;  Surgeon: Juanito Doom, MD;  [facility];  Service: Cardiopulmonary;  Laterality: Bilateral;    There were no vitals filed for this visit.  Subjective Assessment - 04/30/19 1118    Subjective  COVID-19 screen performed prior to patient entering clinic. I tried the exs and did well except the one on my stomach still flairs me up.    Pertinent History  H/o neck and right shoulder pain, left knee, CAD, chemotherapy due to lung cancer, hypothyroidism, spinal  stenosis, left knee surgery.    Currently in Pain?  Yes    Pain Score  3    Certain positions spike pain to 8/10   Pain Location  Back    Pain Orientation  Lower    Pain Descriptors / Indicators  Sore;Aching;Squeezing    Pain Type  Acute pain    Pain Onset  More than a month ago                       [facility] Adult PT Treatment/Exercise - 04/30/19 0001      Exercises   Exercises  Lumbar      Lumbar Exercises: Aerobic   Nustep  L4 x 10 mins      Lumbar Exercises: Standing   Row  --    Other Standing Lumbar Exercises  standing extension    Other Standing Lumbar Exercises  Standing XTS orange band - Rows 4x10, and extension 3x10.      Lumbar Exercises: Supine   Ab Set  15 reps;5 seconds    Bent Knee Raise  3 seconds;15 reps    Dead Bug  10 reps;3 seconds    Bridge  20 reps;5 seconds      Lumbar Exercises: Quadruped   Madcat/Old  Horse  10 reps    Straight Leg Raise  10 reps;3 seconds      Manual Therapy   Manual therapy comments  Instruction in use of theracane and a therapy ball for STW/ TPR for RT shldr blade                  PT Long Term Goals - 04/20/19 1304      PT LONG TERM GOAL #1   Title  I with HEP    Time  4    Period  Weeks    Status  New            Plan - 04/30/19 1125    Clinical Impression Statement  Pt arrived today doing fair, but c/o a pain that is 8/10 around his LT shldr blade and  comes on certain shldr  positions. Pt was advised to not perform exs that cause this pain (prone leg raise). He did well with Rx today and has a good understanding about not pushing through exs that cause pain.  Lat pull down and Rows added to HEP with pictures  and green tband given Pt was instructed in STW/TPR with therapy ball and thera-cane for TPR along LT shldr blade..    Comorbidities  H/o neck and right shoulder pain, left knee, CAD, chemotherapy due to lung cancer, hypothyroidism, spinal stenosis, left knee surgery.    Rehab Potential   Excellent    PT Frequency  2x / week    PT Treatment/Interventions  Therapeutic activities;Therapeutic exercise    PT Next Visit Plan  S and DKTC, hip bridges, prone leg lifts, standing extension, prone press on wall, theraband scapular retraction, advance core exercise.  Foam roll on wall.       Patient will benefit from skilled therapeutic intervention in order to improve the following deficits and impairments:  Pain, Postural dysfunction, Decreased activity tolerance  Visit Diagnosis: Acute left-sided low back pain without sciatica     Problem List Patient Active Problem List   Diagnosis Date Noted  . Shoulder pain 03/31/2019  . Port-A-Cath in place 08/11/2018  . Hypothyroidism (acquired) 08/11/2018  . Metastasis to lung ([place]) 05/19/2018  . Encounter for antineoplastic chemotherapy 05/13/2018  . Encounter for antineoplastic immunotherapy 05/13/2018  . Goals of care, counseling/discussion 05/13/2018  . 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 Axillary adenopathy 04/24/2018  . Right lower lobe lung mass 04/22/2018  . Cough with hemoptysis 04/22/2018  . Wheezing 04/02/2018  . Long-term use of high-risk medication 04/02/2018  . Dizziness 06/26/2017  . Fatigue 04/30/2016  . Insomnia 04/30/2016  . Hyperlipidemia 01/31/2016  . Ear pain 05/09/2015  . Anemia   . Abnormal nuclear stress test   . OBESITY, UNSPECIFIED 03/28/2009  . ESSENTIAL HYPERTENSION, BENIGN 03/28/2009  . PALPITATIONS 03/28/2009  . SNORING 03/28/2009    Alyana Kreiter,CHRIS 04/30/2019, 1:42 PM  [facility] [place], [place], [address] Phone: [phone]   Fax:  [phone]  Name: Travis Padilla MRN: [id number] Date of Birth: 1946/01/16

## 2019-05-01 ENCOUNTER — Other Ambulatory Visit: Payer: Self-pay | Admitting: Family Medicine

## 2019-05-05 DIAGNOSIS — Z23 Encounter for immunization: Secondary | ICD-10-CM | POA: Diagnosis not present

## 2019-05-07 ENCOUNTER — Ambulatory Visit: Payer: Medicare Other | Admitting: *Deleted

## 2019-05-07 ENCOUNTER — Other Ambulatory Visit: Payer: Self-pay

## 2019-05-07 DIAGNOSIS — M545 Low back pain, unspecified: Secondary | ICD-10-CM

## 2019-05-07 NOTE — Therapy (Signed)
[facility] [place], [place], [address] Phone: [phone]   Fax:  [phone]  Physical Therapy Treatment  Patient Details  Name: JERAMINE DELIS MRN: [id number] Date of Birth: [birth year] Referring Provider (PT): [name] Plants MD.   Encounter Date: 05/07/2019  PT End of Session - 05/07/19 1036    Visit Number  4    Number of Visits  8    Date for PT Re-Evaluation  05/18/19    PT Start Time  1030    PT Stop Time  1107    PT Time Calculation (min)  37 min    Activity Tolerance  Patient tolerated treatment well    Behavior During Therapy  [facility] for tasks assessed/performed       Past Medical History:  Diagnosis Date  . Abnormal nuclear stress test    December, 2013  . Anemia   . CAD (coronary artery disease)    Mild nonobstructive plaque in cath 2013  . Chest pain    December, 2013  . Dizziness   . Gout   . Hemorrhoid   . Hyperlipidemia   . Hypertension   . Skin cancer   . Spinal stenosis     Past Surgical History:  Procedure Laterality Date  . 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 BELPHAROPTOSIS REPAIR Bilateral   . CATARACT EXTRACTION W/ INTRAOCULAR LENS  IMPLANT, BILATERAL    . COLONOSCOPY N/A 11/03/2014   Procedure: COLONOSCOPY;  Surgeon: Rogene Houston, MD;  [facility];  Service: Endoscopy;  Laterality: N/A;  1225  . IR IMAGING GUIDED PORT INSERTION  07/11/2018  . KNEE CARTILAGE SURGERY Left    Left knee  . SKIN CANCER EXCISION  12/2014, 04/25/15  . VIDEO BRONCHOSCOPY Bilateral 05/09/2018   Procedure: VIDEO BRONCHOSCOPY WITH FLUORO;  Surgeon: Juanito Doom, MD;  Location: [facility];  Service: Cardiopulmonary;  Laterality: Bilateral;    There were no vitals filed for this visit.  Subjective Assessment - 05/07/19 1034    Subjective  COVID-19 screen performed prior to patient entering clinic. Doing good. 2/10 LBP today. Make it my last visit. Shldr 4/10. 50% improvement overall    Pertinent History  H/o neck and right shoulder pain, left knee, CAD, chemotherapy due to lung cancer, hypothyroidism, spinal stenosis, left knee surgery.    Currently in Pain?  Yes    Pain Score  2     Pain Location  Back    Pain Descriptors / Indicators  Aching;Sore    Pain Type  Acute pain    Pain Onset  More than a month ago                       [facility] Adult PT Treatment/Exercise - 05/07/19 0001      Exercises   Exercises  Shoulder      Lumbar Exercises: Aerobic   Nustep  L5 x 10 mins      Lumbar Exercises: Standing   Row  Strengthening;Both;Theraband;10 reps    Theraband Level (Row)  Level 3 (Green)    Shoulder Extension  Strengthening;Left;10 reps    Other Standing Lumbar Exercises  standing extension      Lumbar Exercises: Supine   Other Supine Lumbar Exercises  discussed HEP for LB exs.       Lumbar Exercises: Quadruped   [place]/Old Horse  10 reps      Shoulder Exercises: Standing   Other Standing Exercises  rockwood 4, Rows and extensions, [employer] press 5#,   . Reviewe                  PT Long Term Goals - 05/07/19 1037      PT LONG TERM GOAL #1   Title  I with HEP    Period  Weeks    Status  Achieved      PT LONG TERM GOAL #2   Title  Patient able to reach behind his back without pain    Time  4    Period  Weeks    Status  Partially Met      PT LONG TERM GOAL #3   Title  Patient able to perform throwing motion with 1-2/10 pain or less.    Time  4    Period  Weeks    Status  Achieved      PT LONG TERM GOAL #4   Title  Patient able to tolerate his normal exercise routine with 80% improvement in R shoulder.    Baseline  today 50-60% better    Time  4    Period  Weeks    Status  Partially Met      PT LONG TERM GOAL #5   Title  tolerate sitting for 20 minutes with pain not >3/10    Time  4    Period  Weeks    Status  Achieved            Plan - 05/07/19 1443    Clinical Impression Statement  Pt arrived today  doing fairly well and feels he is ready to DC to HEP. HEP for shldr and back exs were reviewed. Majority of LTGs met except for  shldr due to pain. He reports 50-60% improvement overall.    Comorbidities  H/o neck and right shoulder pain, left knee, CAD, chemotherapy due to lung cancer, hypothyroidism, spinal stenosis, left knee surgery.    Stability/Clinical Decision Making  Evolving/Moderate complexity    Rehab Potential  Excellent    PT Frequency  2x / week    PT Duration  4 weeks    PT Treatment/Interventions  Therapeutic activities;Therapeutic exercise    PT Next Visit Plan  DC to HEP as per Pt.    Consulted and Agree with Plan of Care  Patient       Patient will benefit from skilled therapeutic intervention in order to improve the following deficits and impairments:  Pain, Postural dysfunction, Decreased activity tolerance  Visit Diagnosis: Acute left-sided low back pain without sciatica     Problem List Patient Active Problem List   Diagnosis Date Noted  . Shoulder pain 03/31/2019  . Port-A-Cath in place 08/11/2018  . Hypothyroidism (acquired) 08/11/2018  . Metastasis to lung ([place]) 05/19/2018  . Encounter for antineoplastic chemotherapy 05/13/2018  . Encounter for antineoplastic immunotherapy 05/13/2018  . Goals of care, counseling/discussion 05/13/2018  . 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[place]) 05/13/2018  . Axillary adenopathy 04/24/2018  . Right lower lobe lung mass 04/22/2018  . Cough with hemoptysis 04/22/2018  . Wheezing 04/02/2018  . Long-term use of high-risk medication 04/02/2018  . Dizziness 06/26/2017  . Fatigue 04/30/2016  . Insomnia 04/30/2016  . Hyperlipidemia 01/31/2016  . Ear pain 05/09/2015  . Anemia   . Abnormal nuclear stress test   . OBESITY, UNSPECIFIED 03/28/2009  . ESSENTIAL HYPERTENSION, BENIGN 03/28/2009  . PALPITATIONS 03/28/2009  . SNORING 03/28/2009    RAMSEUR,CHRIS , PTA 05/07/2019,  2:52 PM  [facility] [place], [place], [address] Phone: [phone]   Fax:  [phone]  Name: CAIRO AGOSTINELLI MRN: [id number] Date of Birth: 01-18-1946  PHYSICAL THERAPY DISCHARGE SUMMARY  Visits from Start of Care: 4.  Current functional level related to goals / functional outcomes: See above.   Remaining deficits: See above.   Education / Equipment: HEP. Plan: Patient agrees to discharge.  Patient goals were partially met. Patient is being discharged due to being pleased with the current functional level.  ?????         Mali Applegate MPT

## 2019-05-12 ENCOUNTER — Inpatient Hospital Stay: Payer: Medicare Other | Attending: Oncology

## 2019-05-12 ENCOUNTER — Other Ambulatory Visit: Payer: Self-pay

## 2019-05-12 ENCOUNTER — Inpatient Hospital Stay: Payer: Medicare Other

## 2019-05-12 ENCOUNTER — Encounter: Payer: Self-pay | Admitting: Internal Medicine

## 2019-05-12 ENCOUNTER — Inpatient Hospital Stay (HOSPITAL_BASED_OUTPATIENT_CLINIC_OR_DEPARTMENT_OTHER): Payer: Medicare Other | Admitting: Internal Medicine

## 2019-05-12 VITALS — BP 98/62 | HR 84 | Temp 97.8°F | Resp 18 | Ht 73.0 in | Wt 231.3 lb

## 2019-05-12 DIAGNOSIS — K5900 Constipation, unspecified: Secondary | ICD-10-CM | POA: Insufficient documentation

## 2019-05-12 DIAGNOSIS — Z85828 Personal history of other malignant neoplasm of skin: Secondary | ICD-10-CM | POA: Diagnosis not present

## 2019-05-12 DIAGNOSIS — C7801 Secondary malignant neoplasm of right lung: Secondary | ICD-10-CM

## 2019-05-12 DIAGNOSIS — C3490 Malignant neoplasm of unspecified part of unspecified bronchus or lung: Secondary | ICD-10-CM

## 2019-05-12 DIAGNOSIS — C3412 Malignant neoplasm of upper lobe, left bronchus or lung: Secondary | ICD-10-CM | POA: Insufficient documentation

## 2019-05-12 DIAGNOSIS — R5383 Other fatigue: Secondary | ICD-10-CM | POA: Insufficient documentation

## 2019-05-12 DIAGNOSIS — I10 Essential (primary) hypertension: Secondary | ICD-10-CM | POA: Diagnosis not present

## 2019-05-12 DIAGNOSIS — M549 Dorsalgia, unspecified: Secondary | ICD-10-CM | POA: Diagnosis not present

## 2019-05-12 DIAGNOSIS — E032 Hypothyroidism due to medicaments and other exogenous substances: Secondary | ICD-10-CM

## 2019-05-12 DIAGNOSIS — Z8719 Personal history of other diseases of the digestive system: Secondary | ICD-10-CM | POA: Insufficient documentation

## 2019-05-12 DIAGNOSIS — Z79899 Other long term (current) drug therapy: Secondary | ICD-10-CM | POA: Diagnosis not present

## 2019-05-12 DIAGNOSIS — I2510 Atherosclerotic heart disease of native coronary artery without angina pectoris: Secondary | ICD-10-CM | POA: Insufficient documentation

## 2019-05-12 DIAGNOSIS — C3491 Malignant neoplasm of unspecified part of right bronchus or lung: Secondary | ICD-10-CM | POA: Diagnosis not present

## 2019-05-12 DIAGNOSIS — Z95828 Presence of other vascular implants and grafts: Secondary | ICD-10-CM

## 2019-05-12 DIAGNOSIS — Z5112 Encounter for antineoplastic immunotherapy: Secondary | ICD-10-CM | POA: Diagnosis not present

## 2019-05-12 DIAGNOSIS — E785 Hyperlipidemia, unspecified: Secondary | ICD-10-CM | POA: Insufficient documentation

## 2019-05-12 DIAGNOSIS — E039 Hypothyroidism, unspecified: Secondary | ICD-10-CM | POA: Insufficient documentation

## 2019-05-12 LAB — CMP (CANCER CENTER ONLY)
ALT: 17 U/L (ref 0–44)
AST: 16 U/L (ref 15–41)
Albumin: 3.9 g/dL (ref 3.5–5.0)
Alkaline Phosphatase: 61 U/L (ref 38–126)
Anion gap: 9 (ref 5–15)
BUN: 13 mg/dL (ref 8–23)
CO2: 26 mmol/L (ref 22–32)
Calcium: 8.7 mg/dL — ABNORMAL LOW (ref 8.9–10.3)
Chloride: 107 mmol/L (ref 98–111)
Creatinine: 0.96 mg/dL (ref 0.61–1.24)
GFR, Est AFR Am: >60 mL/min (ref >60)
GFR, Estimated: >60 mL/min (ref >60)
Glucose, Bld: 119 mg/dL — ABNORMAL HIGH (ref 70–99)
Potassium: 3.8 mmol/L (ref 3.5–5.1)
Sodium: 142 mmol/L (ref 135–145)
Total Bilirubin: 0.4 mg/dL (ref 0.3–1.2)
Total Protein: 7.3 g/dL (ref 6.5–8.1)

## 2019-05-12 LAB — CBC WITH DIFFERENTIAL (CANCER CENTER ONLY)
Abs Immature Granulocytes: 0.03 10*3/uL (ref 0.00–0.07)
Basophils Absolute: 0 10*3/uL (ref 0.0–0.1)
Basophils Relative: 0 %
Eosinophils Absolute: 0.2 10*3/uL (ref 0.0–0.5)
Eosinophils Relative: 4 %
HCT: 37.5 % — ABNORMAL LOW (ref 39.0–52.0)
Hemoglobin: 12.6 g/dL — ABNORMAL LOW (ref 13.0–17.0)
Immature Granulocytes: 1 %
Lymphocytes Relative: 24 %
Lymphs Abs: 1.2 10*3/uL (ref 0.7–4.0)
MCH: 30.4 pg (ref 26.0–34.0)
MCHC: 33.6 g/dL (ref 30.0–36.0)
MCV: 90.6 fL (ref 80.0–100.0)
Monocytes Absolute: 0.6 10*3/uL (ref 0.1–1.0)
Monocytes Relative: 12 %
Neutro Abs: 3 10*3/uL (ref 1.7–7.7)
Neutrophils Relative %: 59 %
Platelet Count: 146 10*3/uL — ABNORMAL LOW (ref 150–400)
RBC: 4.14 MIL/uL — ABNORMAL LOW (ref 4.22–5.81)
RDW: 11.9 % (ref 11.5–15.5)
WBC Count: 5.2 10*3/uL (ref 4.0–10.5)
nRBC: 0 % (ref 0.0–0.2)

## 2019-05-12 LAB — TSH: TSH: 8.578 u[IU]/mL — ABNORMAL HIGH (ref 0.320–4.118)

## 2019-05-12 MED ORDER — SODIUM CHLORIDE 0.9% FLUSH
10.0000 mL | Freq: Once | INTRAVENOUS | Status: AC
  Administered 2019-05-12: 10:00:00 10 mL
  Filled 2019-05-12: qty 10

## 2019-05-12 MED ORDER — HEPARIN SOD (PORK) LOCK FLUSH 100 UNIT/ML IV SOLN
500.0000 [IU] | Freq: Once | INTRAVENOUS | Status: AC | PRN
  Administered 2019-05-12: 500 [IU]
  Filled 2019-05-12: qty 5

## 2019-05-12 MED ORDER — SODIUM CHLORIDE 0.9 % IV SOLN
Freq: Once | INTRAVENOUS | Status: AC
  Administered 2019-05-12: 10:00:00 via INTRAVENOUS
  Filled 2019-05-12: qty 250

## 2019-05-12 MED ORDER — SODIUM CHLORIDE 0.9 % IV SOLN
200.0000 mg | Freq: Once | INTRAVENOUS | Status: AC
  Administered 2019-05-12: 200 mg via INTRAVENOUS
  Filled 2019-05-12: qty 8

## 2019-05-12 MED ORDER — SODIUM CHLORIDE 0.9% FLUSH
10.0000 mL | INTRAVENOUS | Status: DC | PRN
  Administered 2019-05-12: 12:00:00 10 mL
  Filled 2019-05-12: qty 10

## 2019-05-12 NOTE — Progress Notes (Signed)
[facility] [phone]   [phone]  OFFICE PROGRESS NOTE  [name], [address] [place] [address]  DIAGNOSIS: Stage IV (T3, N0, M1c) non-small cell lung cancer, squamous cell carcinoma presented with large right infrahilar mass in addition to left upper lobe lung nodule as well as left axillary mass with left axillary lymph node diagnosed in August 2019.  PRIOR THERAPY:  1) Palliative radiotherapy to the right infrahilar mass as well as the axillary mass under the care of Dr. Lisbeth Renshaw. 2) Systemic chemotherapy with carboplatin for AUC of 5, paclitaxel 175 mg/M2 and Keytruda 200 mg IV every 3 weeks status post 4 cycles.  CURRENT THERAPY: Maintenance immunotherapy with single agent Keytruda 200 mg IV every 3 weeks status post 14 cycles.  INTERVAL HISTORY: Travis Padilla 73 y.o. male returns to the clinic today for follow-up visit.  The patient is feeling fine today with no concerning complaints except for constipation and occasional hemorrhoidal bleed.  He takes Dulcolax on as-needed basis.  He denied having any chest pain, shortness of breath, cough or hemoptysis.  He denied having any fever or chills.  He has no nausea, vomiting, or diarrhea.  He has no recent weight loss or night sweats.  He has no headache or visual changes.  He continues to tolerate his treatment with Keytruda fairly well.  The patient is here today for evaluation before starting cycle #15.  MEDICAL HISTORY: Past Medical History:  Diagnosis Date  . Abnormal nuclear stress test    December, 2013  . Anemia   . CAD (coronary artery disease)    Mild nonobstructive plaque in cath 2013  . Chest pain    December, 2013  . Dizziness   . Gout   . Hemorrhoid   . Hyperlipidemia   . Hypertension   . Skin cancer   . Spinal stenosis     ALLERGIES:  is allergic to trazodone and nefazodone.  MEDICATIONS:  Current Outpatient Medications  Medication Sig Dispense  Refill  . Artificial Tear Solution (SOOTHE XP OP) Place 1 drop into both eyes 2 (two) times daily.    . Boswellia-Glucosamine-Vit D (OSTEO BI-FLEX ONE PER DAY PO) Take by mouth.    . carvedilol (COREG) 12.5 MG tablet TAKE 1 AND 1/2 TABLETS BY MOUTH TWO TIMES DAILY WITH A MEAL 90 tablet 0  . diphenhydramine-acetaminophen (TYLENOL PM) 25-500 MG TABS tablet Take 1-2 tablets by mouth at bedtime as needed (pain).    Marland Kitchen levothyroxine (SYNTHROID) 100 MCG tablet TAKE 1 TABLET (100 MCG TOTAL) BY MOUTH DAILY BEFORE BREAKFAST. 30 tablet 0  . lidocaine-prilocaine (EMLA) cream Apply 1 application topically as needed. 30 g 1  . Multiple Vitamins-Minerals (PRESERVISION AREDS 2 PO) Take by mouth.    . oxyCODONE-acetaminophen (PERCOCET/ROXICET) 5-325 MG tablet Take 1 tablet by mouth every 8 (eight) hours as needed for severe pain. 30 tablet 0  . pravastatin (PRAVACHOL) 40 MG tablet TAKE 1 TABLET BY MOUTH EVERY DAY (Patient taking differently: Take 40 mg by mouth at bedtime. ) 90 tablet 3  . senna-docusate (SENNA S) 8.6-50 MG tablet 1 to 2 twice daily for constipation 120 tablet 5  . albuterol (PROVENTIL) (2.5 MG/3ML) 0.083% nebulizer solution Take 3 mLs (2.5 mg total) by nebulization every 6 (six) hours as needed for wheezing or shortness of breath. (Patient not taking: Reported on 04/21/2019) 75 mL 12  . ALPRAZolam (XANAX) 0.25 MG tablet Take 1 tablet (0.25 mg  total) by mouth at bedtime as needed for anxiety. (Patient not taking: Reported on 04/21/2019) 15 tablet 0  . mirtazapine (REMERON) 15 MG tablet TAKE 1 TABLET BY MOUTH EVERYDAY AT BEDTIME (Patient not taking: Reported on 03/31/2019) 90 tablet 1  . prochlorperazine (COMPAZINE) 10 MG tablet Take 1 tablet (10 mg total) by mouth every 6 (six) hours as needed for nausea or vomiting. (Patient not taking: Reported on 04/21/2019) 30 tablet 1   No current facility-administered medications for this visit.     SURGICAL HISTORY:  Past Surgical History:  Procedure  Laterality Date  . 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 BELPHAROPTOSIS REPAIR Bilateral   . CATARACT EXTRACTION W/ INTRAOCULAR LENS  IMPLANT, BILATERAL    . COLONOSCOPY N/A 11/03/2014   Procedure: COLONOSCOPY;  Surgeon: Rogene Houston, MD;  [facility];  Service: Endoscopy;  Laterality: N/A;  1225  . IR IMAGING GUIDED PORT INSERTION  07/11/2018  . KNEE CARTILAGE SURGERY Left    Left knee  . SKIN CANCER EXCISION  12/2014, 04/25/15  . VIDEO BRONCHOSCOPY Bilateral 05/09/2018   Procedure: VIDEO BRONCHOSCOPY WITH FLUORO;  Surgeon: Juanito Doom, MD;  [facility];  Service: Cardiopulmonary;  Laterality: Bilateral;    REVIEW OF SYSTEMS:  A comprehensive review of systems was negative except for: Gastrointestinal: positive for constipation Musculoskeletal: positive for back pain   PHYSICAL EXAMINATION: General appearance: alert, cooperative and no distress Head: Normocephalic, without obvious abnormality, atraumatic Neck: no adenopathy, no JVD, supple, symmetrical, trachea midline and thyroid not enlarged, symmetric, no tenderness/mass/nodules Lymph nodes: Cervical, supraclavicular, and axillary nodes normal. Resp: clear to auscultation bilaterally Back: symmetric, no curvature. ROM normal. No CVA tenderness. Cardio: regular rate and rhythm, S1, S2 normal, no murmur, click, rub or gallop GI: soft, non-tender; bowel sounds normal; no masses,  no organomegaly Extremities: extremities normal, atraumatic, no cyanosis or edema  ECOG PERFORMANCE STATUS: 1 - Symptomatic but completely ambulatory  Blood pressure 98/62, pulse 84, temperature 97.8 F (36.6 C), temperature source Oral, resp. rate 18, height 6\' 1"  (1.854 m), weight 231 lb 4.8 oz (104.9 kg), SpO2 100 %.  LABORATORY DATA: Lab Results  Component Value Date   WBC 5.2 05/12/2019   HGB 12.6 (L) 05/12/2019   HCT 37.5 (L) 05/12/2019   MCV 90.6 05/12/2019   PLT 146 (L) 05/12/2019      Chemistry      Component  Value Date/Time   NA 140 04/21/2019 1018   NA 144 04/09/2018 1507   K 4.3 04/21/2019 1018   CL 107 04/21/2019 1018   CO2 27 04/21/2019 1018   BUN 21 04/21/2019 1018   BUN 15 04/09/2018 1507   CREATININE 1.02 04/21/2019 1018      Component Value Date/Time   CALCIUM 9.2 04/21/2019 1018   ALKPHOS 54 04/21/2019 1018   AST 15 04/21/2019 1018   ALT 15 04/21/2019 1018   BILITOT 0.7 04/21/2019 1018       RADIOGRAPHIC STUDIES: No results found.  ASSESSMENT AND PLAN: This is a very pleasant 73 years old white male with very light most smoking history recently diagnosed with stage IV (T3, N0, M1c) non-small cell lung cancer, squamous cell carcinoma based on the biopsy from the left axillary mass. He status post 4 cycles of induction systemic chemotherapy with carboplatin, paclitaxel and Keytruda with partial response.  The patient is currently on maintenance treatment with single agent Keytruda status post 14 cycles. I recommended for the patient to  proceed with cycle #15 today as planned. I will see him back for follow-up visit in 3 weeks for evaluation after repeating CT scan of the chest, abdomen and pelvis for restaging of his disease. For the constipation he will continue with Dulcolax and MiraLAX on as-needed basis. For the back pain, he will continue on Tylenol and oxycodone as needed. For the hypothyroidism we will continue to monitor his TSH closely and adjust his dose of levothyroxine accordingly. The patient was advised to call immediately if he has any concerning symptoms in the interval. The patient voices understanding of current disease status and treatment options and is in agreement with the current care plan. All questions were answered. The patient knows to call the clinic with any problems, questions or concerns. We can certainly see the patient much sooner if necessary.  Disclaimer: This note was dictated with voice recognition software. Similar sounding words can  inadvertently be transcribed and may not be corrected upon review.

## 2019-05-12 NOTE — Patient Instructions (Signed)
Coronavirus (COVID-19) Are you at risk?  Are you at risk for the Coronavirus (COVID-19)?  To be considered HIGH RISK for Coronavirus (COVID-19), you have to meet the following criteria:  . Traveled to [place], [place], [place], [place] or [place]; or in the [place] to [place], [place], [place], or [place]; and have fever, cough, and shortness of breath within the last 2 weeks of travel OR . Been in close contact with a person diagnosed with COVID-19 within the last 2 weeks and have fever, cough, and shortness of breath . IF YOU DO NOT MEET THESE CRITERIA, YOU ARE CONSIDERED LOW RISK FOR COVID-19.  What to do if you are HIGH RISK for COVID-19?  . If you are having a medical emergency, call 911. . Seek medical care right away. Before you go to a doctor's office, urgent care or emergency department, call ahead and tell them about your recent travel, contact with someone diagnosed with COVID-19, and your symptoms. You should receive instructions from your physician's office regarding next steps of care.  . When you arrive at healthcare provider, tell the healthcare staff immediately you have returned from visiting [place], [place], [place], [place] or [place]; or traveled in the [place] to [place], [place], [place], or [place]; in the last two weeks or you have been in close contact with a person diagnosed with COVID-19 in the last 2 weeks.   . Tell the health care staff about your symptoms: fever, cough and shortness of breath. . After you have been seen by a medical provider, you will be either: o Tested for (COVID-19) and discharged home on quarantine except to seek medical care if symptoms worsen, and asked to  - Stay home and avoid contact with others until you get your results (4-5 days)  - Avoid travel on public transportation if possible (such as bus, train, or airplane) or o Sent to the Emergency Department by EMS for evaluation, COVID-19 testing, and possible  admission depending on your condition and test results.  What to do if you are LOW RISK for COVID-19?  Reduce your risk of any infection by using the same precautions used for avoiding the common cold or flu:  . Wash your hands often with soap and warm water for at least 20 seconds.  If soap and water are not readily available, use an alcohol-based hand sanitizer with at least 60% alcohol.  . If coughing or sneezing, cover your mouth and nose by coughing or sneezing into the elbow areas of your shirt or coat, into a tissue or into your sleeve (not your hands). . Avoid shaking hands with others and consider head nods or verbal greetings only. . Avoid touching your eyes, nose, or mouth with unwashed hands.  . Avoid close contact with people who are sick. . Avoid places or events with large numbers of people in one location, like concerts or sporting events. . Carefully consider travel plans you have or are making. . If you are planning any travel outside or inside the [place], visit the CDC's Travelers' Health webpage for the latest health notices. . If you have some symptoms but not all symptoms, continue to monitor at home and seek medical attention if your symptoms worsen. . If you are having a medical emergency, call 911.   ADDITIONAL HEALTHCARE OPTIONS FOR PATIENTS  [facility] / e-Visit: [email or web address]         [facility]: [phone]  [facility]: [phone]                   [facility]: [phone]   [facility] Discharge Instructions for Patients Receiving Chemotherapy  Today you received the following chemotherapy agents Keytruda  To help prevent nausea and vomiting after your treatment, we encourage you to take your nausea medication as directed.    If you develop nausea and vomiting that is not controlled by your nausea medication, call the clinic.   BELOW ARE  SYMPTOMS THAT SHOULD BE REPORTED IMMEDIATELY:  *FEVER GREATER THAN 100.5 F  *CHILLS WITH OR WITHOUT FEVER  NAUSEA AND VOMITING THAT IS NOT CONTROLLED WITH YOUR NAUSEA MEDICATION  *UNUSUAL SHORTNESS OF BREATH  *UNUSUAL BRUISING OR BLEEDING  TENDERNESS IN MOUTH AND THROAT WITH OR WITHOUT PRESENCE OF ULCERS  *URINARY PROBLEMS  *BOWEL PROBLEMS  UNUSUAL RASH Items with * indicate a potential emergency and should be followed up as soon as possible.  Feel free to call the clinic should you have any questions or concerns. The clinic phone number is [phone].  Please show the CHEMO ALERT CARD at check-in to the Emergency Department and triage nurse.   

## 2019-05-14 ENCOUNTER — Telehealth: Payer: Self-pay | Admitting: Internal Medicine

## 2019-05-14 NOTE — Telephone Encounter (Signed)
Patient already on schedule for next q3w appointments as requested per 9/1 los.

## 2019-05-28 ENCOUNTER — Other Ambulatory Visit: Payer: Self-pay | Admitting: Internal Medicine

## 2019-05-28 DIAGNOSIS — C3491 Malignant neoplasm of unspecified part of right bronchus or lung: Secondary | ICD-10-CM

## 2019-06-01 ENCOUNTER — Other Ambulatory Visit: Payer: Self-pay

## 2019-06-01 ENCOUNTER — Ambulatory Visit (HOSPITAL_COMMUNITY)
Admission: RE | Admit: 2019-06-01 | Discharge: 2019-06-01 | Disposition: A | Discharge status: Home / Self Care | Payer: Medicare Other | Source: Ambulatory Visit | Attending: Internal Medicine | Admitting: Internal Medicine

## 2019-06-01 ENCOUNTER — Telehealth: Payer: Self-pay | Admitting: *Deleted

## 2019-06-01 DIAGNOSIS — C3491 Malignant neoplasm of unspecified part of right bronchus or lung: Secondary | ICD-10-CM | POA: Diagnosis not present

## 2019-06-01 DIAGNOSIS — C3490 Malignant neoplasm of unspecified part of unspecified bronchus or lung: Secondary | ICD-10-CM | POA: Insufficient documentation

## 2019-06-01 DIAGNOSIS — N202 Calculus of kidney with calculus of ureter: Secondary | ICD-10-CM | POA: Diagnosis not present

## 2019-06-01 MED ORDER — HEPARIN SOD (PORK) LOCK FLUSH 100 UNIT/ML IV SOLN
INTRAVENOUS | Status: AC
  Administered 2019-06-01: 11:00:00 500 [IU]
  Filled 2019-06-01: qty 5

## 2019-06-01 MED ORDER — SODIUM CHLORIDE (PF) 0.9 % IJ SOLN
INTRAMUSCULAR | Status: AC
  Filled 2019-06-01: qty 50

## 2019-06-01 MED ORDER — IOHEXOL 300 MG/ML  SOLN
100.0000 mL | Freq: Once | INTRAMUSCULAR | Status: AC | PRN
  Administered 2019-06-01: 10:00:00 100 mL via INTRAVENOUS

## 2019-06-01 NOTE — Telephone Encounter (Signed)
Lattie Haw, Radiology CT [phone]). "Travis Padilla here reporting itching to port-a-cath after use.  Do not see an allergy to Heparin.  Is there an alternative to Heparin."  No treatment notes regarding port itching or heparin allergy.  Care Note to use OpSite dressing.  Radiology confirmed OpSite on hand.  Transferred to provider's nurse as requested to confirm.

## 2019-06-02 ENCOUNTER — Inpatient Hospital Stay (HOSPITAL_BASED_OUTPATIENT_CLINIC_OR_DEPARTMENT_OTHER): Payer: Medicare Other | Admitting: Internal Medicine

## 2019-06-02 ENCOUNTER — Inpatient Hospital Stay: Payer: Medicare Other

## 2019-06-02 ENCOUNTER — Encounter: Payer: Self-pay | Admitting: Internal Medicine

## 2019-06-02 ENCOUNTER — Other Ambulatory Visit: Payer: Self-pay

## 2019-06-02 ENCOUNTER — Telehealth: Payer: Self-pay | Admitting: Internal Medicine

## 2019-06-02 VITALS — BP 127/78 | HR 77 | Temp 98.3°F | Resp 17 | Ht 73.0 in | Wt 236.0 lb

## 2019-06-02 DIAGNOSIS — R5383 Other fatigue: Secondary | ICD-10-CM

## 2019-06-02 DIAGNOSIS — I2510 Atherosclerotic heart disease of native coronary artery without angina pectoris: Secondary | ICD-10-CM | POA: Diagnosis not present

## 2019-06-02 DIAGNOSIS — C3491 Malignant neoplasm of unspecified part of right bronchus or lung: Secondary | ICD-10-CM

## 2019-06-02 DIAGNOSIS — Z5112 Encounter for antineoplastic immunotherapy: Secondary | ICD-10-CM | POA: Diagnosis not present

## 2019-06-02 DIAGNOSIS — E039 Hypothyroidism, unspecified: Secondary | ICD-10-CM | POA: Diagnosis not present

## 2019-06-02 DIAGNOSIS — Z95828 Presence of other vascular implants and grafts: Secondary | ICD-10-CM

## 2019-06-02 DIAGNOSIS — E032 Hypothyroidism due to medicaments and other exogenous substances: Secondary | ICD-10-CM

## 2019-06-02 DIAGNOSIS — K5900 Constipation, unspecified: Secondary | ICD-10-CM | POA: Diagnosis not present

## 2019-06-02 DIAGNOSIS — C7801 Secondary malignant neoplasm of right lung: Secondary | ICD-10-CM

## 2019-06-02 DIAGNOSIS — C3412 Malignant neoplasm of upper lobe, left bronchus or lung: Secondary | ICD-10-CM | POA: Diagnosis not present

## 2019-06-02 LAB — CBC WITH DIFFERENTIAL (CANCER CENTER ONLY)
Abs Immature Granulocytes: 0.01 10*3/uL (ref 0.00–0.07)
Basophils Absolute: 0 10*3/uL (ref 0.0–0.1)
Basophils Relative: 1 %
Eosinophils Absolute: 0.3 10*3/uL (ref 0.0–0.5)
Eosinophils Relative: 5 %
HCT: 36.3 % — ABNORMAL LOW (ref 39.0–52.0)
Hemoglobin: 12.3 g/dL — ABNORMAL LOW (ref 13.0–17.0)
Immature Granulocytes: 0 %
Lymphocytes Relative: 16 %
Lymphs Abs: 0.8 10*3/uL (ref 0.7–4.0)
MCH: 30.6 pg (ref 26.0–34.0)
MCHC: 33.9 g/dL (ref 30.0–36.0)
MCV: 90.3 fL (ref 80.0–100.0)
Monocytes Absolute: 0.8 10*3/uL (ref 0.1–1.0)
Monocytes Relative: 15 %
Neutro Abs: 3.3 10*3/uL (ref 1.7–7.7)
Neutrophils Relative %: 63 %
Platelet Count: 138 10*3/uL — ABNORMAL LOW (ref 150–400)
RBC: 4.02 MIL/uL — ABNORMAL LOW (ref 4.22–5.81)
RDW: 12.1 % (ref 11.5–15.5)
WBC Count: 5.2 10*3/uL (ref 4.0–10.5)
nRBC: 0 % (ref 0.0–0.2)

## 2019-06-02 LAB — CMP (CANCER CENTER ONLY)
ALT: 8 U/L (ref 0–44)
AST: 14 U/L — ABNORMAL LOW (ref 15–41)
Albumin: 4 g/dL (ref 3.5–5.0)
Alkaline Phosphatase: 59 U/L (ref 38–126)
Anion gap: 7 (ref 5–15)
BUN: 12 mg/dL (ref 8–23)
CO2: 27 mmol/L (ref 22–32)
Calcium: 8.9 mg/dL (ref 8.9–10.3)
Chloride: 105 mmol/L (ref 98–111)
Creatinine: 0.88 mg/dL (ref 0.61–1.24)
GFR, Est AFR Am: >60 mL/min (ref >60)
GFR, Estimated: >60 mL/min (ref >60)
Glucose, Bld: 97 mg/dL (ref 70–99)
Potassium: 4.4 mmol/L (ref 3.5–5.1)
Sodium: 139 mmol/L (ref 135–145)
Total Bilirubin: 0.5 mg/dL (ref 0.3–1.2)
Total Protein: 7.2 g/dL (ref 6.5–8.1)

## 2019-06-02 LAB — TSH: TSH: 6.913 u[IU]/mL — ABNORMAL HIGH (ref 0.320–4.118)

## 2019-06-02 MED ORDER — SODIUM CHLORIDE 0.9% FLUSH
10.0000 mL | Freq: Once | INTRAVENOUS | Status: AC
  Administered 2019-06-02: 10 mL
  Filled 2019-06-02: qty 10

## 2019-06-02 MED ORDER — HEPARIN SOD (PORK) LOCK FLUSH 100 UNIT/ML IV SOLN
500.0000 [IU] | Freq: Once | INTRAVENOUS | Status: AC | PRN
  Administered 2019-06-02: 500 [IU]
  Filled 2019-06-02: qty 5

## 2019-06-02 MED ORDER — SODIUM CHLORIDE 0.9 % IV SOLN
Freq: Once | INTRAVENOUS | Status: AC
  Administered 2019-06-02: 11:00:00 via INTRAVENOUS
  Filled 2019-06-02: qty 250

## 2019-06-02 MED ORDER — SODIUM CHLORIDE 0.9% FLUSH
10.0000 mL | INTRAVENOUS | Status: DC | PRN
  Administered 2019-06-02: 13:00:00 10 mL
  Filled 2019-06-02: qty 10

## 2019-06-02 MED ORDER — SODIUM CHLORIDE 0.9 % IV SOLN
200.0000 mg | Freq: Once | INTRAVENOUS | Status: AC
  Administered 2019-06-02: 200 mg via INTRAVENOUS
  Filled 2019-06-02: qty 8

## 2019-06-02 NOTE — Progress Notes (Signed)
[facility] [phone]   [phone]  OFFICE PROGRESS NOTE  [name], [place] [address]  DIAGNOSIS: Stage IV (T3, N0, M1c) non-small cell lung cancer, squamous cell carcinoma presented with large right infrahilar mass in addition to left upper lobe lung nodule as well as left axillary mass with left axillary lymph node diagnosed in August 2019.  PRIOR THERAPY:  1) Palliative radiotherapy to the right infrahilar mass as well as the axillary mass under the care of Dr. Lisbeth Renshaw. 2) Systemic chemotherapy with carboplatin for AUC of 5, paclitaxel 175 mg/M2 and Keytruda 200 mg IV every 3 weeks status post 4 cycles.  CURRENT THERAPY: Maintenance immunotherapy with single agent Keytruda 200 mg IV every 3 weeks status post 15 cycles.  INTERVAL HISTORY: Travis Padilla 73 y.o. male returns to the clinic today for follow-up visit.  The patient is feeling fine today with no concerning complaints.  He has been tolerating his treatment with maintenance Keytruda fairly well.  He denied having any chest pain, shortness of breath, cough or hemoptysis.  He has no fever or chills.  He denied having any recent weight loss or night sweats.  He has no nausea, vomiting, diarrhea or constipation.  The patient had repeat CT scan of the chest, abdomen and pelvis performed recently and he is here for evaluation and discussion of his scan results.  MEDICAL HISTORY: Past Medical History:  Diagnosis Date   Abnormal nuclear stress test    December, 2013   Anemia    CAD (coronary artery disease)    Mild nonobstructive plaque in cath 2013   Chest pain    December, 2013   Dizziness    Gout    Hemorrhoid    Hyperlipidemia    Hypertension    Skin cancer    Spinal stenosis     ALLERGIES:  is allergic to trazodone and nefazodone.  MEDICATIONS:  Current Outpatient Medications  Medication Sig Dispense Refill   albuterol (PROVENTIL) (2.5  MG/3ML) 0.083% nebulizer solution Take 3 mLs (2.5 mg total) by nebulization every 6 (six) hours as needed for wheezing or shortness of breath. (Patient not taking: Reported on 04/21/2019) 75 mL 12   ALPRAZolam (XANAX) 0.25 MG tablet Take 1 tablet (0.25 mg total) by mouth at bedtime as needed for anxiety. (Patient not taking: Reported on 04/21/2019) 15 tablet 0   Artificial Tear Solution (SOOTHE XP OP) Place 1 drop into both eyes 2 (two) times daily.     Boswellia-Glucosamine-Vit D (OSTEO BI-FLEX ONE PER DAY PO) Take by mouth.     carvedilol (COREG) 12.5 MG tablet TAKE 1 AND 1/2 TABLETS BY MOUTH TWO TIMES DAILY WITH A MEAL 90 tablet 0   diphenhydramine-acetaminophen (TYLENOL PM) 25-500 MG TABS tablet Take 1-2 tablets by mouth at bedtime as needed (pain).     levothyroxine (SYNTHROID) 100 MCG tablet TAKE 1 TABLET (100 MCG TOTAL) BY MOUTH DAILY BEFORE BREAKFAST. 30 tablet 0   lidocaine-prilocaine (EMLA) cream Apply 1 application topically as needed. 30 g 1   mirtazapine (REMERON) 15 MG tablet TAKE 1 TABLET BY MOUTH EVERYDAY AT BEDTIME (Patient not taking: Reported on 03/31/2019) 90 tablet 1   Multiple Vitamins-Minerals (PRESERVISION AREDS 2 PO) Take by mouth.     oxyCODONE-acetaminophen (PERCOCET/ROXICET) 5-325 MG tablet Take 1 tablet by mouth every 8 (eight) hours as needed for severe pain. 30 tablet 0   pravastatin (PRAVACHOL) 40 MG tablet TAKE 1  TABLET BY MOUTH EVERY DAY (Patient taking differently: Take 40 mg by mouth at bedtime. ) 90 tablet 3   prochlorperazine (COMPAZINE) 10 MG tablet Take 1 tablet (10 mg total) by mouth every 6 (six) hours as needed for nausea or vomiting. (Patient not taking: Reported on 04/21/2019) 30 tablet 1   senna-docusate (SENNA S) 8.6-50 MG tablet 1 to 2 twice daily for constipation 120 tablet 5   No current facility-administered medications for this visit.     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   BELPHAROPTOSIS REPAIR Bilateral    CATARACT EXTRACTION W/ INTRAOCULAR LENS  IMPLANT, BILATERAL     COLONOSCOPY N/A 11/03/2014   Procedure: COLONOSCOPY;  Surgeon: Rogene Houston, MD;  Location: [facility];  Service: Endoscopy;  Laterality: N/A;  1225   IR IMAGING GUIDED PORT INSERTION  07/11/2018   KNEE CARTILAGE SURGERY Left    Left knee   SKIN CANCER EXCISION  12/2014, 04/25/15   VIDEO BRONCHOSCOPY Bilateral 05/09/2018   Procedure: VIDEO BRONCHOSCOPY WITH FLUORO;  Surgeon: Juanito Doom, MD;  Location: [facility];  Service: Cardiopulmonary;  Laterality: Bilateral;    REVIEW OF SYSTEMS:  Constitutional: positive for fatigue Eyes: negative Ears, nose, mouth, throat, and face: negative Respiratory: negative Cardiovascular: negative Gastrointestinal: negative Genitourinary:negative Integument/breast: negative Hematologic/lymphatic: negative Musculoskeletal:negative Neurological: negative Behavioral/Psych: negative Endocrine: negative Allergic/Immunologic: negative   PHYSICAL EXAMINATION: General appearance: alert, cooperative and no distress Head: Normocephalic, without obvious abnormality, atraumatic Neck: no adenopathy, no JVD, supple, symmetrical, trachea midline and thyroid not enlarged, symmetric, no tenderness/mass/nodules Lymph nodes: Cervical, supraclavicular, and axillary nodes normal. Resp: clear to auscultation bilaterally Back: symmetric, no curvature. ROM normal. No CVA tenderness. Cardio: regular rate and rhythm, S1, S2 normal, no murmur, click, rub or gallop GI: soft, non-tender; bowel sounds normal; no masses,  no organomegaly Extremities: extremities normal, atraumatic, no cyanosis or edema Neurologic: Alert and oriented X 3, normal strength and tone. Normal symmetric reflexes. Normal coordination and gait  ECOG PERFORMANCE STATUS: 1 - Symptomatic but completely ambulatory  Blood pressure 127/78, pulse 77, temperature 98.3 F (36.8 C),  temperature source Temporal, resp. rate 17, height 6\' 1"  (1.854 m), weight 236 lb (107 kg), SpO2 98 %.  LABORATORY DATA: Lab Results  Component Value Date   WBC 5.2 06/02/2019   HGB 12.3 (L) 06/02/2019   HCT 36.3 (L) 06/02/2019   MCV 90.3 06/02/2019   PLT 138 (L) 06/02/2019      Chemistry      Component Value Date/Time   NA 139 06/02/2019 0947   NA 144 04/09/2018 1507   K 4.4 06/02/2019 0947   CL 105 06/02/2019 0947   CO2 27 06/02/2019 0947   BUN 12 06/02/2019 0947   BUN 15 04/09/2018 1507   CREATININE 0.88 06/02/2019 0947      Component Value Date/Time   CALCIUM 8.9 06/02/2019 0947   ALKPHOS 59 06/02/2019 0947   AST 14 (L) 06/02/2019 0947   ALT 8 06/02/2019 0947   BILITOT 0.5 06/02/2019 0947       RADIOGRAPHIC STUDIES: Ct Chest W Contrast  Result Date: 06/01/2019 CLINICAL DATA:  Staging right-sided lung cancer, chemotherapy EXAM: CT CHEST, ABDOMEN, AND PELVIS WITH CONTRAST TECHNIQUE: Multidetector CT imaging of the chest, abdomen and pelvis was performed following the standard protocol during bolus administration of intravenous contrast. CONTRAST:  177mL OMNIPAQUE IOHEXOL 300 MG/ML SOLN, additional oral enteric contrast COMPARISON:  03/30/2019, 01/23/2019 FINDINGS: CT CHEST FINDINGS Cardiovascular: Right chest port catheter. Aortic atherosclerosis. Normal heart size. Three-vessel coronary artery calcifications. No pericardial effusion. Mediastinum/Nodes: Unchanged spiculated soft tissue mass in the left axilla, measuring 2.2 x 1.4 cm (series 2, image 8). No enlarged mediastinal or hilar lymph nodes. Thyroid gland, trachea, and esophagus demonstrate no significant findings. Lungs/Pleura: Unchanged perihilar fibrotic scarring, bronchiectasis, and volume loss of the right lung. Unchanged lobulated mass of the anterior left upper lobe, measuring 3.2 x 2.4 cm (series 6, image 79). No pleural effusion or pneumothorax. Musculoskeletal: No chest wall mass or suspicious bone lesions  identified. CT ABDOMEN PELVIS FINDINGS Hepatobiliary: No solid liver abnormality is seen. No gallstones, gallbladder wall thickening, or biliary dilatation. Pancreas: Unremarkable. No pancreatic ductal dilatation or surrounding inflammatory changes. Spleen: Normal in size without significant abnormality. Adrenals/Urinary Tract: Adrenal glands are unremarkable. Multiple tiny nonobstructive bilateral renal calculi. No hydronephrosis. Bladder is unremarkable. Stomach/Bowel: Stomach is within normal limits. Appendix appears normal. No evidence of bowel wall thickening, distention, or inflammatory changes. Sigmoid diverticulosis. Vascular/Lymphatic: Aortic atherosclerosis. No enlarged abdominal or pelvic lymph nodes. Reproductive: No mass or other abnormality. Other: No abdominal wall hernia or abnormality. No abdominopelvic ascites. Musculoskeletal: No acute or significant osseous findings. IMPRESSION: 1. Unchanged post treatment perihilar fibrotic scarring, bronchiectasis, and volume loss of the right lung. 2. Unchanged lobulated mass of the anterior left upper lobe, measuring 3.2 x 2.4 cm (series 6, image 79). 3. Unchanged spiculated soft tissue mass in the left axilla, measuring 2.2 x 1.4 cm (series 2, image 8). 4.  No evidence of metastatic disease in the abdomen or pelvis. 5.  Nonobstructive bilateral nephrolithiasis. 6.  Coronary artery disease.  Aortic Atherosclerosis (ICD10-I70.0). Electronically Signed   By: Eddie Candle M.D.   On: 06/01/2019 12:03   Ct Abdomen Pelvis W Contrast  Result Date: 06/01/2019 CLINICAL DATA:  Staging right-sided lung cancer, chemotherapy EXAM: CT CHEST, ABDOMEN, AND PELVIS WITH CONTRAST TECHNIQUE: Multidetector CT imaging of the chest, abdomen and pelvis was performed following the standard protocol during bolus administration of intravenous contrast. CONTRAST:  152mL OMNIPAQUE IOHEXOL 300 MG/ML SOLN, additional oral enteric contrast COMPARISON:  03/30/2019, 01/23/2019 FINDINGS:  CT CHEST FINDINGS Cardiovascular: Right chest port catheter. Aortic atherosclerosis. Normal heart size. Three-vessel coronary artery calcifications. No pericardial effusion. Mediastinum/Nodes: Unchanged spiculated soft tissue mass in the left axilla, measuring 2.2 x 1.4 cm (series 2, image 8). No enlarged mediastinal or hilar lymph nodes. Thyroid gland, trachea, and esophagus demonstrate no significant findings. Lungs/Pleura: Unchanged perihilar fibrotic scarring, bronchiectasis, and volume loss of the right lung. Unchanged lobulated mass of the anterior left upper lobe, measuring 3.2 x 2.4 cm (series 6, image 79). No pleural effusion or pneumothorax. Musculoskeletal: No chest wall mass or suspicious bone lesions identified. CT ABDOMEN PELVIS FINDINGS Hepatobiliary: No solid liver abnormality is seen. No gallstones, gallbladder wall thickening, or biliary dilatation. Pancreas: Unremarkable. No pancreatic ductal dilatation or surrounding inflammatory changes. Spleen: Normal in size without significant abnormality. Adrenals/Urinary Tract: Adrenal glands are unremarkable. Multiple tiny nonobstructive bilateral renal calculi. No hydronephrosis. Bladder is unremarkable. Stomach/Bowel: Stomach is within normal limits. Appendix appears normal. No evidence of bowel wall thickening, distention, or inflammatory changes. Sigmoid diverticulosis. Vascular/Lymphatic: Aortic atherosclerosis. No enlarged abdominal or pelvic lymph nodes. Reproductive: No mass or other abnormality. Other: No abdominal wall hernia or abnormality. No abdominopelvic ascites. Musculoskeletal: No acute or significant osseous findings. IMPRESSION: 1. Unchanged post treatment perihilar fibrotic scarring, bronchiectasis, and volume loss of the right lung. 2. Unchanged lobulated  mass of the anterior left upper lobe, measuring 3.2 x 2.4 cm (series 6, image 79). 3. Unchanged spiculated soft tissue mass in the left axilla, measuring 2.2 x 1.4 cm (series 2, image  8). 4.  No evidence of metastatic disease in the abdomen or pelvis. 5.  Nonobstructive bilateral nephrolithiasis. 6.  Coronary artery disease.  Aortic Atherosclerosis (ICD10-I70.0). Electronically Signed   By: Eddie Candle M.D.   On: 06/01/2019 12:03    ASSESSMENT AND PLAN: This is a very pleasant 73 years old white male with very light most smoking history recently diagnosed with stage IV (T3, N0, M1c) non-small cell lung cancer, squamous cell carcinoma based on the biopsy from the left axillary mass. He status post 4 cycles of induction systemic chemotherapy with carboplatin, paclitaxel and Keytruda with partial response.  The patient is currently on maintenance treatment with single agent Keytruda status post 15 cycles. He continues to tolerate this treatment well with no concerning adverse effects. He had repeat CT scan of the chest, abdomen pelvis performed recently.  I personally and independently reviewed the scans and discussed the results with the patient today. His scan showed no concerning findings for disease progression and he has a stable disease. I recommended for him to continue his current treatment with maintenance Keytruda and he will proceed with cycle #16 today. For the hypothyroidism, I will continue to monitor his TSH closely and adjust his dose of levothyroxine accordingly. The patient will come back for follow-up visit in 3 weeks for evaluation before the next cycle of his treatment. He was advised to call immediately if he has any concerning symptoms in the interval. The patient voices understanding of current disease status and treatment options and is in agreement with the current care plan. All questions were answered. The patient knows to call the clinic with any problems, questions or concerns. We can certainly see the patient much sooner if necessary.  Disclaimer: This note was dictated with voice recognition software. Similar sounding words can inadvertently be  transcribed and may not be corrected upon review.

## 2019-06-02 NOTE — Telephone Encounter (Signed)
Scheduled appt per 9/22 los - added additional appts to schedule - pt to get an updated schedule next visit .

## 2019-06-02 NOTE — Patient Instructions (Signed)
[facility] Discharge Instructions for Patients Receiving Chemotherapy  Today you received the following chemotherapy agents:  Keytruda.  To help prevent nausea and vomiting after your treatment, we encourage you to take your nausea medication as directed.   If you develop nausea and vomiting that is not controlled by your nausea medication, call the clinic.   BELOW ARE SYMPTOMS THAT SHOULD BE REPORTED IMMEDIATELY:  *FEVER GREATER THAN 100.5 F  *CHILLS WITH OR WITHOUT FEVER  NAUSEA AND VOMITING THAT IS NOT CONTROLLED WITH YOUR NAUSEA MEDICATION  *UNUSUAL SHORTNESS OF BREATH  *UNUSUAL BRUISING OR BLEEDING  TENDERNESS IN MOUTH AND THROAT WITH OR WITHOUT PRESENCE OF ULCERS  *URINARY PROBLEMS  *BOWEL PROBLEMS  UNUSUAL RASH Items with * indicate a potential emergency and should be followed up as soon as possible.  Feel free to call the clinic should you have any questions or concerns. The clinic phone number is [phone].  Please show the CHEMO ALERT CARD at check-in to the Emergency Department and triage nurse.    

## 2019-06-23 ENCOUNTER — Inpatient Hospital Stay: Payer: Medicare Other

## 2019-06-23 ENCOUNTER — Telehealth: Payer: Self-pay | Admitting: Physician Assistant

## 2019-06-23 ENCOUNTER — Inpatient Hospital Stay: Payer: Medicare Other | Attending: Oncology

## 2019-06-23 ENCOUNTER — Other Ambulatory Visit: Payer: Self-pay | Admitting: Internal Medicine

## 2019-06-23 ENCOUNTER — Other Ambulatory Visit: Payer: Self-pay

## 2019-06-23 ENCOUNTER — Encounter: Payer: Self-pay | Admitting: Internal Medicine

## 2019-06-23 ENCOUNTER — Inpatient Hospital Stay (HOSPITAL_BASED_OUTPATIENT_CLINIC_OR_DEPARTMENT_OTHER): Payer: Medicare Other | Admitting: Internal Medicine

## 2019-06-23 VITALS — BP 113/76 | HR 72 | Temp 98.0°F | Resp 18 | Ht 73.0 in | Wt 236.7 lb

## 2019-06-23 DIAGNOSIS — C3401 Malignant neoplasm of right main bronchus: Secondary | ICD-10-CM | POA: Diagnosis not present

## 2019-06-23 DIAGNOSIS — C3491 Malignant neoplasm of unspecified part of right bronchus or lung: Secondary | ICD-10-CM | POA: Diagnosis not present

## 2019-06-23 DIAGNOSIS — I10 Essential (primary) hypertension: Secondary | ICD-10-CM | POA: Insufficient documentation

## 2019-06-23 DIAGNOSIS — C7801 Secondary malignant neoplasm of right lung: Secondary | ICD-10-CM | POA: Insufficient documentation

## 2019-06-23 DIAGNOSIS — E032 Hypothyroidism due to medicaments and other exogenous substances: Secondary | ICD-10-CM

## 2019-06-23 DIAGNOSIS — Z85828 Personal history of other malignant neoplasm of skin: Secondary | ICD-10-CM | POA: Insufficient documentation

## 2019-06-23 DIAGNOSIS — Z95828 Presence of other vascular implants and grafts: Secondary | ICD-10-CM

## 2019-06-23 DIAGNOSIS — Z79899 Other long term (current) drug therapy: Secondary | ICD-10-CM | POA: Insufficient documentation

## 2019-06-23 DIAGNOSIS — Z5112 Encounter for antineoplastic immunotherapy: Secondary | ICD-10-CM | POA: Diagnosis not present

## 2019-06-23 DIAGNOSIS — M2550 Pain in unspecified joint: Secondary | ICD-10-CM | POA: Insufficient documentation

## 2019-06-23 DIAGNOSIS — E785 Hyperlipidemia, unspecified: Secondary | ICD-10-CM | POA: Insufficient documentation

## 2019-06-23 DIAGNOSIS — I2510 Atherosclerotic heart disease of native coronary artery without angina pectoris: Secondary | ICD-10-CM | POA: Diagnosis not present

## 2019-06-23 LAB — CMP (CANCER CENTER ONLY)
ALT: 13 U/L (ref 0–44)
AST: 16 U/L (ref 15–41)
Albumin: 4.1 g/dL (ref 3.5–5.0)
Alkaline Phosphatase: 55 U/L (ref 38–126)
Anion gap: 9 (ref 5–15)
BUN: 17 mg/dL (ref 8–23)
CO2: 26 mmol/L (ref 22–32)
Calcium: 9.2 mg/dL (ref 8.9–10.3)
Chloride: 107 mmol/L (ref 98–111)
Creatinine: 1.02 mg/dL (ref 0.61–1.24)
GFR, Est AFR Am: >60 mL/min (ref >60)
GFR, Estimated: >60 mL/min (ref >60)
Glucose, Bld: 96 mg/dL (ref 70–99)
Potassium: 4.3 mmol/L (ref 3.5–5.1)
Sodium: 142 mmol/L (ref 135–145)
Total Bilirubin: 0.7 mg/dL (ref 0.3–1.2)
Total Protein: 7.6 g/dL (ref 6.5–8.1)

## 2019-06-23 LAB — CBC WITH DIFFERENTIAL (CANCER CENTER ONLY)
Abs Immature Granulocytes: 0.02 10*3/uL (ref 0.00–0.07)
Basophils Absolute: 0 10*3/uL (ref 0.0–0.1)
Basophils Relative: 1 %
Eosinophils Absolute: 0.3 10*3/uL (ref 0.0–0.5)
Eosinophils Relative: 6 %
HCT: 37.5 % — ABNORMAL LOW (ref 39.0–52.0)
Hemoglobin: 12.8 g/dL — ABNORMAL LOW (ref 13.0–17.0)
Immature Granulocytes: 0 %
Lymphocytes Relative: 20 %
Lymphs Abs: 1.1 10*3/uL (ref 0.7–4.0)
MCH: 31 pg (ref 26.0–34.0)
MCHC: 34.1 g/dL (ref 30.0–36.0)
MCV: 90.8 fL (ref 80.0–100.0)
Monocytes Absolute: 0.8 10*3/uL (ref 0.1–1.0)
Monocytes Relative: 15 %
Neutro Abs: 3.2 10*3/uL (ref 1.7–7.7)
Neutrophils Relative %: 58 %
Platelet Count: 152 10*3/uL (ref 150–400)
RBC: 4.13 MIL/uL — ABNORMAL LOW (ref 4.22–5.81)
RDW: 12.2 % (ref 11.5–15.5)
WBC Count: 5.6 10*3/uL (ref 4.0–10.5)
nRBC: 0 % (ref 0.0–0.2)

## 2019-06-23 LAB — TSH: TSH: 11.07 u[IU]/mL — ABNORMAL HIGH (ref 0.320–4.118)

## 2019-06-23 MED ORDER — LEVOTHYROXINE SODIUM 125 MCG PO TABS: 125.0000 ug | ORAL_TABLET | Freq: Every day | ORAL | 2 refills | Status: DC

## 2019-06-23 MED ORDER — SODIUM CHLORIDE 0.9 % IV SOLN
Freq: Once | INTRAVENOUS | Status: AC
  Administered 2019-06-23: 11:00:00 via INTRAVENOUS
  Filled 2019-06-23: qty 250

## 2019-06-23 MED ORDER — SODIUM CHLORIDE 0.9% FLUSH
10.0000 mL | INTRAVENOUS | Status: DC | PRN
  Administered 2019-06-23: 10 mL
  Filled 2019-06-23: qty 10

## 2019-06-23 MED ORDER — SODIUM CHLORIDE 0.9 % IV SOLN
200.0000 mg | Freq: Once | INTRAVENOUS | Status: AC
  Administered 2019-06-23: 200 mg via INTRAVENOUS
  Filled 2019-06-23: qty 8

## 2019-06-23 MED ORDER — HEPARIN SOD (PORK) LOCK FLUSH 100 UNIT/ML IV SOLN
500.0000 [IU] | Freq: Once | INTRAVENOUS | Status: AC | PRN
  Administered 2019-06-23: 500 [IU]
  Filled 2019-06-23: qty 5

## 2019-06-23 MED ORDER — SODIUM CHLORIDE 0.9% FLUSH
10.0000 mL | Freq: Once | INTRAVENOUS | Status: AC
  Administered 2019-06-23: 10:00:00 10 mL
  Filled 2019-06-23: qty 10

## 2019-06-23 NOTE — Patient Instructions (Signed)
[facility] Discharge Instructions for Patients Receiving Chemotherapy  Today you received the following chemotherapy agents:  Keytruda.  To help prevent nausea and vomiting after your treatment, we encourage you to take your nausea medication as directed.   If you develop nausea and vomiting that is not controlled by your nausea medication, call the clinic.   BELOW ARE SYMPTOMS THAT SHOULD BE REPORTED IMMEDIATELY:  *FEVER GREATER THAN 100.5 F  *CHILLS WITH OR WITHOUT FEVER  NAUSEA AND VOMITING THAT IS NOT CONTROLLED WITH YOUR NAUSEA MEDICATION  *UNUSUAL SHORTNESS OF BREATH  *UNUSUAL BRUISING OR BLEEDING  TENDERNESS IN MOUTH AND THROAT WITH OR WITHOUT PRESENCE OF ULCERS  *URINARY PROBLEMS  *BOWEL PROBLEMS  UNUSUAL RASH Items with * indicate a potential emergency and should be followed up as soon as possible.  Feel free to call the clinic should you have any questions or concerns. The clinic phone number is [phone].  Please show the CHEMO ALERT CARD at check-in to the Emergency Department and triage nurse.    

## 2019-06-23 NOTE — Telephone Encounter (Signed)
Spoke to the patient's wife to inform her that we have sent a new prescription for the patient's thyroid medication to his pharmacy. She expressed understanding and they will pick it up.

## 2019-06-23 NOTE — Progress Notes (Signed)
[place] Telephone:([phone]) [phone]   Fax:(336) [phone]  OFFICE PROGRESS NOTE  [name], [place] [address]  DIAGNOSIS: Stage IV (T3, N0, M1c) non-small cell lung cancer, squamous cell carcinoma presented with large right infrahilar mass in addition to left upper lobe lung nodule as well as left axillary mass with left axillary lymph node diagnosed in August 2019.  PRIOR THERAPY:  1) Palliative radiotherapy to the right infrahilar mass as well as the axillary mass under the care of Dr. Lisbeth Renshaw. 2) Systemic chemotherapy with carboplatin for AUC of 5, paclitaxel 175 mg/M2 and Keytruda 200 mg IV every 3 weeks status post 4 cycles.  CURRENT THERAPY: Maintenance immunotherapy with single agent Keytruda 200 mg IV every 3 weeks status post 16 cycles.  INTERVAL HISTORY: Travis Padilla 73 y.o. male returns to the clinic today for follow-up visit.  The patient is feeling fine today with no concerning complaints except for arthralgia.  He denied having any chest pain, shortness of breath, cough or hemoptysis.  He denied having any fever or chills.  He has no nausea, vomiting, diarrhea or constipation.  He has no headache or visual changes.  He has been tolerating his treatment with Keytruda fairly well.  Is here today for evaluation before starting cycle #21 of his treatment.  MEDICAL HISTORY: Past Medical History:  Diagnosis Date   Abnormal nuclear stress test    December, 2013   Anemia    CAD (coronary artery disease)    Mild nonobstructive plaque in cath 2013   Chest pain    December, 2013   Dizziness    Gout    Hemorrhoid    Hyperlipidemia    Hypertension    Skin cancer    Spinal stenosis     ALLERGIES:  is allergic to trazodone and nefazodone.  MEDICATIONS:  Current Outpatient Medications  Medication Sig Dispense Refill   albuterol (PROVENTIL) (2.5 MG/3ML) 0.083% nebulizer solution Take 3 mLs (2.5 mg total) by  nebulization every 6 (six) hours as needed for wheezing or shortness of breath. (Patient not taking: Reported on 04/21/2019) 75 mL 12   ALPRAZolam (XANAX) 0.25 MG tablet Take 1 tablet (0.25 mg total) by mouth at bedtime as needed for anxiety. (Patient not taking: Reported on 04/21/2019) 15 tablet 0   Artificial Tear Solution (SOOTHE XP OP) Place 1 drop into both eyes 2 (two) times daily.     carvedilol (COREG) 12.5 MG tablet TAKE 1 AND 1/2 TABLETS BY MOUTH TWO TIMES DAILY WITH A MEAL 90 tablet 0   diphenhydramine-acetaminophen (TYLENOL PM) 25-500 MG TABS tablet Take 1-2 tablets by mouth at bedtime as needed (pain).     levothyroxine (SYNTHROID) 100 MCG tablet TAKE 1 TABLET (100 MCG TOTAL) BY MOUTH DAILY BEFORE BREAKFAST. 30 tablet 0   lidocaine-prilocaine (EMLA) cream Apply 1 application topically as needed. 30 g 1   mirtazapine (REMERON) 15 MG tablet TAKE 1 TABLET BY MOUTH EVERYDAY AT BEDTIME (Patient not taking: Reported on 03/31/2019) 90 tablet 1   Multiple Vitamins-Minerals (PRESERVISION AREDS 2 PO) Take by mouth.     oxyCODONE-acetaminophen (PERCOCET/ROXICET) 5-325 MG tablet Take 1 tablet by mouth every 8 (eight) hours as needed for severe pain. 30 tablet 0   pravastatin (PRAVACHOL) 40 MG tablet TAKE 1 TABLET BY MOUTH EVERY DAY (Patient taking differently: Take 40 mg by mouth at bedtime. ) 90 tablet 3   prochlorperazine (COMPAZINE) 10 MG tablet Take 1 tablet (  10 mg total) by mouth every 6 (six) hours as needed for nausea or vomiting. (Patient not taking: Reported on 04/21/2019) 30 tablet 1   senna-docusate (SENNA S) 8.6-50 MG tablet 1 to 2 twice daily for constipation 120 tablet 5   No current facility-administered medications for this visit.     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   BELPHAROPTOSIS REPAIR Bilateral    CATARACT EXTRACTION W/ INTRAOCULAR LENS  IMPLANT, BILATERAL     COLONOSCOPY N/A 11/03/2014     Procedure: COLONOSCOPY;  Surgeon: Rogene Houston, MD;  Location: [facility];  Service: Endoscopy;  Laterality: N/A;  1225   IR IMAGING GUIDED PORT INSERTION  07/11/2018   KNEE CARTILAGE SURGERY Left    Left knee   SKIN CANCER EXCISION  12/2014, 04/25/15   VIDEO BRONCHOSCOPY Bilateral 05/09/2018   Procedure: VIDEO BRONCHOSCOPY WITH FLUORO;  Surgeon: Juanito Doom, MD;  Location: [facility];  Service: Cardiopulmonary;  Laterality: Bilateral;    REVIEW OF SYSTEMS:  A comprehensive review of systems was negative except for: Musculoskeletal: positive for arthralgias   PHYSICAL EXAMINATION: General appearance: alert, cooperative and no distress Head: Normocephalic, without obvious abnormality, atraumatic Neck: no adenopathy, no JVD, supple, symmetrical, trachea midline and thyroid not enlarged, symmetric, no tenderness/mass/nodules Lymph nodes: Cervical, supraclavicular, and axillary nodes normal. Resp: clear to auscultation bilaterally Back: symmetric, no curvature. ROM normal. No CVA tenderness. Cardio: regular rate and rhythm, S1, S2 normal, no murmur, click, rub or gallop GI: soft, non-tender; bowel sounds normal; no masses,  no organomegaly Extremities: extremities normal, atraumatic, no cyanosis or edema  ECOG PERFORMANCE STATUS: 1 - Symptomatic but completely ambulatory  Blood pressure 113/76, pulse 72, temperature 98 F (36.7 C), temperature source Temporal, resp. rate 18, height 6\' 1"  (1.854 m), weight 236 lb 11.2 oz (107.4 kg), SpO2 97 %.  LABORATORY DATA: Lab Results  Component Value Date   WBC 5.6 06/23/2019   HGB 12.8 (L) 06/23/2019   HCT 37.5 (L) 06/23/2019   MCV 90.8 06/23/2019   PLT 152 06/23/2019      Chemistry      Component Value Date/Time   NA 139 06/02/2019 0947   NA 144 04/09/2018 1507   K 4.4 06/02/2019 0947   CL 105 06/02/2019 0947   CO2 27 06/02/2019 0947   BUN 12 06/02/2019 0947   BUN 15 04/09/2018 1507   CREATININE 0.88 06/02/2019 0947       Component Value Date/Time   CALCIUM 8.9 06/02/2019 0947   ALKPHOS 59 06/02/2019 0947   AST 14 (L) 06/02/2019 0947   ALT 8 06/02/2019 0947   BILITOT 0.5 06/02/2019 0947       RADIOGRAPHIC STUDIES: Ct Chest W Contrast  Result Date: 06/01/2019 CLINICAL DATA:  Staging right-sided lung cancer, chemotherapy EXAM: CT CHEST, ABDOMEN, AND PELVIS WITH CONTRAST TECHNIQUE: Multidetector CT imaging of the chest, abdomen and pelvis was performed following the standard protocol during bolus administration of intravenous contrast. CONTRAST:  179mL OMNIPAQUE IOHEXOL 300 MG/ML SOLN, additional oral enteric contrast COMPARISON:  03/30/2019, 01/23/2019 FINDINGS: CT CHEST FINDINGS Cardiovascular: Right chest port catheter. Aortic atherosclerosis. Normal heart size. Three-vessel coronary artery calcifications. No pericardial effusion. Mediastinum/Nodes: Unchanged spiculated soft tissue mass in the left axilla, measuring 2.2 x 1.4 cm (series 2, image 8). No enlarged mediastinal or hilar lymph nodes. Thyroid gland, trachea, and esophagus demonstrate no significant findings. Lungs/Pleura: Unchanged perihilar fibrotic scarring, bronchiectasis,  and volume loss of the right lung. Unchanged lobulated mass of the anterior left upper lobe, measuring 3.2 x 2.4 cm (series 6, image 79). No pleural effusion or pneumothorax. Musculoskeletal: No chest wall mass or suspicious bone lesions identified. CT ABDOMEN PELVIS FINDINGS Hepatobiliary: No solid liver abnormality is seen. No gallstones, gallbladder wall thickening, or biliary dilatation. Pancreas: Unremarkable. No pancreatic ductal dilatation or surrounding inflammatory changes. Spleen: Normal in size without significant abnormality. Adrenals/Urinary Tract: Adrenal glands are unremarkable. Multiple tiny nonobstructive bilateral renal calculi. No hydronephrosis. Bladder is unremarkable. Stomach/Bowel: Stomach is within normal limits. Appendix appears normal. No evidence of bowel  wall thickening, distention, or inflammatory changes. Sigmoid diverticulosis. Vascular/Lymphatic: Aortic atherosclerosis. No enlarged abdominal or pelvic lymph nodes. Reproductive: No mass or other abnormality. Other: No abdominal wall hernia or abnormality. No abdominopelvic ascites. Musculoskeletal: No acute or significant osseous findings. IMPRESSION: 1. Unchanged post treatment perihilar fibrotic scarring, bronchiectasis, and volume loss of the right lung. 2. Unchanged lobulated mass of the anterior left upper lobe, measuring 3.2 x 2.4 cm (series 6, image 79). 3. Unchanged spiculated soft tissue mass in the left axilla, measuring 2.2 x 1.4 cm (series 2, image 8). 4.  No evidence of metastatic disease in the abdomen or pelvis. 5.  Nonobstructive bilateral nephrolithiasis. 6.  Coronary artery disease.  Aortic Atherosclerosis (ICD10-I70.0). Electronically Signed   By: Eddie Candle M.D.   On: 06/01/2019 12:03   Ct Abdomen Pelvis W Contrast  Result Date: 06/01/2019 CLINICAL DATA:  Staging right-sided lung cancer, chemotherapy EXAM: CT CHEST, ABDOMEN, AND PELVIS WITH CONTRAST TECHNIQUE: Multidetector CT imaging of the chest, abdomen and pelvis was performed following the standard protocol during bolus administration of intravenous contrast. CONTRAST:  13mL OMNIPAQUE IOHEXOL 300 MG/ML SOLN, additional oral enteric contrast COMPARISON:  03/30/2019, 01/23/2019 FINDINGS: CT CHEST FINDINGS Cardiovascular: Right chest port catheter. Aortic atherosclerosis. Normal heart size. Three-vessel coronary artery calcifications. No pericardial effusion. Mediastinum/Nodes: Unchanged spiculated soft tissue mass in the left axilla, measuring 2.2 x 1.4 cm (series 2, image 8). No enlarged mediastinal or hilar lymph nodes. Thyroid gland, trachea, and esophagus demonstrate no significant findings. Lungs/Pleura: Unchanged perihilar fibrotic scarring, bronchiectasis, and volume loss of the right lung. Unchanged lobulated mass of the  anterior left upper lobe, measuring 3.2 x 2.4 cm (series 6, image 79). No pleural effusion or pneumothorax. Musculoskeletal: No chest wall mass or suspicious bone lesions identified. CT ABDOMEN PELVIS FINDINGS Hepatobiliary: No solid liver abnormality is seen. No gallstones, gallbladder wall thickening, or biliary dilatation. Pancreas: Unremarkable. No pancreatic ductal dilatation or surrounding inflammatory changes. Spleen: Normal in size without significant abnormality. Adrenals/Urinary Tract: Adrenal glands are unremarkable. Multiple tiny nonobstructive bilateral renal calculi. No hydronephrosis. Bladder is unremarkable. Stomach/Bowel: Stomach is within normal limits. Appendix appears normal. No evidence of bowel wall thickening, distention, or inflammatory changes. Sigmoid diverticulosis. Vascular/Lymphatic: Aortic atherosclerosis. No enlarged abdominal or pelvic lymph nodes. Reproductive: No mass or other abnormality. Other: No abdominal wall hernia or abnormality. No abdominopelvic ascites. Musculoskeletal: No acute or significant osseous findings. IMPRESSION: 1. Unchanged post treatment perihilar fibrotic scarring, bronchiectasis, and volume loss of the right lung. 2. Unchanged lobulated mass of the anterior left upper lobe, measuring 3.2 x 2.4 cm (series 6, image 79). 3. Unchanged spiculated soft tissue mass in the left axilla, measuring 2.2 x 1.4 cm (series 2, image 8). 4.  No evidence of metastatic disease in the abdomen or pelvis. 5.  Nonobstructive bilateral nephrolithiasis. 6.  Coronary artery disease.  Aortic Atherosclerosis (ICD10-I70.0). Electronically Signed  By: Eddie Candle M.D.   On: 06/01/2019 12:03    ASSESSMENT AND PLAN: This is a very pleasant 73 years old white male with very light most smoking history recently diagnosed with stage IV (T3, N0, M1c) non-small cell lung cancer, squamous cell carcinoma based on the biopsy from the left axillary mass. He status post 4 cycles of induction  systemic chemotherapy with carboplatin, paclitaxel and Keytruda with partial response.  The patient is currently on maintenance treatment with single agent Keytruda status post 16 cycles. He has been tolerating this treatment well with no concerning adverse effects. I recommended for him to proceed with cycle #17 of the maintenance therapy today as planned. For the hypothyroidism, I will continue to monitor his TSH closely and adjust his dose of levothyroxine accordingly. He will come back for follow-up visit in 3 weeks for evaluation before the next cycle of his treatment. The patient was advised to call immediately if he has any concerning symptoms in the interval. The patient voices understanding of current disease status and treatment options and is in agreement with the current care plan. All questions were answered. The patient knows to call the clinic with any problems, questions or concerns. We can certainly see the patient much sooner if necessary.  Disclaimer: This note was dictated with voice recognition software. Similar sounding words can inadvertently be transcribed and may not be corrected upon review.

## 2019-06-27 ENCOUNTER — Other Ambulatory Visit: Payer: Self-pay | Admitting: Family Medicine

## 2019-06-29 NOTE — Telephone Encounter (Signed)
Rakes. NTBS last OV 05/15/18 30 days given 06/04/19

## 2019-07-14 ENCOUNTER — Other Ambulatory Visit: Payer: Self-pay

## 2019-07-14 ENCOUNTER — Telehealth: Payer: Self-pay | Admitting: Internal Medicine

## 2019-07-14 ENCOUNTER — Encounter: Payer: Self-pay | Admitting: Internal Medicine

## 2019-07-14 ENCOUNTER — Inpatient Hospital Stay (HOSPITAL_BASED_OUTPATIENT_CLINIC_OR_DEPARTMENT_OTHER): Payer: Medicare Other | Admitting: Internal Medicine

## 2019-07-14 ENCOUNTER — Inpatient Hospital Stay: Payer: Medicare Other

## 2019-07-14 ENCOUNTER — Inpatient Hospital Stay: Payer: Medicare Other | Attending: Oncology

## 2019-07-14 VITALS — BP 111/74 | HR 77 | Temp 98.5°F | Resp 18 | Ht 73.0 in | Wt 237.2 lb

## 2019-07-14 DIAGNOSIS — I2510 Atherosclerotic heart disease of native coronary artery without angina pectoris: Secondary | ICD-10-CM | POA: Insufficient documentation

## 2019-07-14 DIAGNOSIS — Z923 Personal history of irradiation: Secondary | ICD-10-CM | POA: Diagnosis not present

## 2019-07-14 DIAGNOSIS — C7801 Secondary malignant neoplasm of right lung: Secondary | ICD-10-CM

## 2019-07-14 DIAGNOSIS — Z79899 Other long term (current) drug therapy: Secondary | ICD-10-CM | POA: Insufficient documentation

## 2019-07-14 DIAGNOSIS — E039 Hypothyroidism, unspecified: Secondary | ICD-10-CM | POA: Insufficient documentation

## 2019-07-14 DIAGNOSIS — E785 Hyperlipidemia, unspecified: Secondary | ICD-10-CM | POA: Insufficient documentation

## 2019-07-14 DIAGNOSIS — C3491 Malignant neoplasm of unspecified part of right bronchus or lung: Secondary | ICD-10-CM

## 2019-07-14 DIAGNOSIS — Z9221 Personal history of antineoplastic chemotherapy: Secondary | ICD-10-CM | POA: Diagnosis not present

## 2019-07-14 DIAGNOSIS — E032 Hypothyroidism due to medicaments and other exogenous substances: Secondary | ICD-10-CM

## 2019-07-14 DIAGNOSIS — C773 Secondary and unspecified malignant neoplasm of axilla and upper limb lymph nodes: Secondary | ICD-10-CM | POA: Diagnosis not present

## 2019-07-14 DIAGNOSIS — Z85828 Personal history of other malignant neoplasm of skin: Secondary | ICD-10-CM | POA: Diagnosis not present

## 2019-07-14 DIAGNOSIS — Z5112 Encounter for antineoplastic immunotherapy: Secondary | ICD-10-CM | POA: Insufficient documentation

## 2019-07-14 DIAGNOSIS — I10 Essential (primary) hypertension: Secondary | ICD-10-CM | POA: Diagnosis not present

## 2019-07-14 DIAGNOSIS — Z95828 Presence of other vascular implants and grafts: Secondary | ICD-10-CM

## 2019-07-14 DIAGNOSIS — M549 Dorsalgia, unspecified: Secondary | ICD-10-CM | POA: Diagnosis not present

## 2019-07-14 HISTORY — DX: Hyperlipidemia, unspecified: E78.5

## 2019-07-14 LAB — CBC WITH DIFFERENTIAL (CANCER CENTER ONLY)
Abs Immature Granulocytes: 0.02 10*3/uL (ref 0.00–0.07)
Basophils Absolute: 0 10*3/uL (ref 0.0–0.1)
Basophils Relative: 0 %
Eosinophils Absolute: 0.3 10*3/uL (ref 0.0–0.5)
Eosinophils Relative: 7 %
HCT: 37 % — ABNORMAL LOW (ref 39.0–52.0)
Hemoglobin: 12.5 g/dL — ABNORMAL LOW (ref 13.0–17.0)
Immature Granulocytes: 0 %
Lymphocytes Relative: 23 %
Lymphs Abs: 1.1 10*3/uL (ref 0.7–4.0)
MCH: 30.9 pg (ref 26.0–34.0)
MCHC: 33.8 g/dL (ref 30.0–36.0)
MCV: 91.6 fL (ref 80.0–100.0)
Monocytes Absolute: 0.7 10*3/uL (ref 0.1–1.0)
Monocytes Relative: 15 %
Neutro Abs: 2.5 10*3/uL (ref 1.7–7.7)
Neutrophils Relative %: 55 %
Platelet Count: 153 10*3/uL (ref 150–400)
RBC: 4.04 MIL/uL — ABNORMAL LOW (ref 4.22–5.81)
RDW: 12.5 % (ref 11.5–15.5)
WBC Count: 4.6 10*3/uL (ref 4.0–10.5)
nRBC: 0 % (ref 0.0–0.2)

## 2019-07-14 LAB — CMP (CANCER CENTER ONLY)
ALT: 16 U/L (ref 0–44)
AST: 18 U/L (ref 15–41)
Albumin: 3.9 g/dL (ref 3.5–5.0)
Alkaline Phosphatase: 58 U/L (ref 38–126)
Anion gap: 9 (ref 5–15)
BUN: 18 mg/dL (ref 8–23)
CO2: 23 mmol/L (ref 22–32)
Calcium: 9.1 mg/dL (ref 8.9–10.3)
Chloride: 108 mmol/L (ref 98–111)
Creatinine: 0.99 mg/dL (ref 0.61–1.24)
GFR, Est AFR Am: >60 mL/min (ref >60)
GFR, Estimated: >60 mL/min (ref >60)
Glucose, Bld: 119 mg/dL — ABNORMAL HIGH (ref 70–99)
Potassium: 4.2 mmol/L (ref 3.5–5.1)
Sodium: 140 mmol/L (ref 135–145)
Total Bilirubin: 0.5 mg/dL (ref 0.3–1.2)
Total Protein: 7.4 g/dL (ref 6.5–8.1)

## 2019-07-14 LAB — TSH: TSH: 6.826 u[IU]/mL — ABNORMAL HIGH (ref 0.320–4.118)

## 2019-07-14 MED ORDER — HEPARIN SOD (PORK) LOCK FLUSH 100 UNIT/ML IV SOLN
500.0000 [IU] | Freq: Once | INTRAVENOUS | Status: AC | PRN
  Administered 2019-07-14: 500 [IU]
  Filled 2019-07-14: qty 5

## 2019-07-14 MED ORDER — SODIUM CHLORIDE 0.9 % IV SOLN
Freq: Once | INTRAVENOUS | Status: AC
  Administered 2019-07-14: 10:00:00 via INTRAVENOUS
  Filled 2019-07-14: qty 250

## 2019-07-14 MED ORDER — SODIUM CHLORIDE 0.9% FLUSH
10.0000 mL | Freq: Once | INTRAVENOUS | Status: AC
  Administered 2019-07-14: 09:00:00 10 mL
  Filled 2019-07-14: qty 10

## 2019-07-14 MED ORDER — SODIUM CHLORIDE 0.9% FLUSH
10.0000 mL | INTRAVENOUS | Status: DC | PRN
  Administered 2019-07-14: 10 mL
  Filled 2019-07-14: qty 10

## 2019-07-14 MED ORDER — SODIUM CHLORIDE 0.9 % IV SOLN
200.0000 mg | Freq: Once | INTRAVENOUS | Status: AC
  Administered 2019-07-14: 200 mg via INTRAVENOUS
  Filled 2019-07-14: qty 8

## 2019-07-14 NOTE — Telephone Encounter (Signed)
Scheduled per los. Called, not able to leave msg. Mailed printout  

## 2019-07-14 NOTE — Progress Notes (Signed)
Travis [phone]   [phone]) [phone]  OFFICE PROGRESS NOTE  [name], [place] [address]  DIAGNOSIS: Stage IV (T3, N0, M1c) non-small cell lung cancer, squamous cell carcinoma presented with large right infrahilar mass in addition to left upper lobe lung nodule as well as left axillary mass with left axillary lymph node diagnosed in August 2019.  PRIOR THERAPY:  1) Palliative radiotherapy to the right infrahilar mass as well as the axillary mass under the care of Dr. Lisbeth Renshaw. 2) Systemic chemotherapy with carboplatin for AUC of 5, paclitaxel 175 mg/M2 and Keytruda 200 mg IV every 3 weeks status post 4 cycles.  CURRENT THERAPY: Maintenance immunotherapy with single agent Keytruda 200 mg IV every 3 weeks status post 17 cycles.  INTERVAL HISTORY: Travis Padilla 73 y.o. male returns to the clinic today for follow-up visit.  The patient is feeling fine today with no concerning complaints except for aching pain on the back and bone.  He was working in his yard cleaning some falling trees and cutting woods.  He denied having any current chest pain, shortness of breath, cough or hemoptysis.  He denied having any fever or chills.  He has no nausea, vomiting, diarrhea or constipation.  He has no headache or visual changes.  The patient continues to tolerate his maintenance treatment with [facility] fairly well.  He is here today for evaluation before starting cycle #18.  MEDICAL HISTORY: Past Medical History:  Diagnosis Date  . Abnormal nuclear stress test    December, 2013  . Anemia   . CAD (coronary artery disease)    Mild nonobstructive plaque in cath 2013  . Chest pain    December, 2013  . Dizziness   . Gout   . Hemorrhoid   . Hyperlipidemia   . Hypertension   . Skin cancer   . Spinal stenosis     ALLERGIES:  is allergic to trazodone and nefazodone.  MEDICATIONS:  Current Outpatient Medications  Medication Sig  Dispense Refill  . albuterol (PROVENTIL) (2.5 MG/3ML) 0.083% nebulizer solution Take 3 mLs (2.5 mg total) by nebulization every 6 (six) hours as needed for wheezing or shortness of breath. (Patient not taking: Reported on 04/21/2019) 75 mL 12  . ALPRAZolam (XANAX) 0.25 MG tablet Take 1 tablet (0.25 mg total) by mouth at bedtime as needed for anxiety. (Patient not taking: Reported on 04/21/2019) 15 tablet 0  . Apple Cider Vinegar 500 MG TABS Take by mouth.    . Artificial Tear Solution (SOOTHE XP OP) Place 1 drop into both eyes 2 (two) times daily.    . carvedilol (COREG) 12.5 MG tablet TAKE 1 AND 1/2 TABLETS BY MOUTH TWO TIMES DAILY WITH A MEAL 90 tablet 0  . Cyanocobalamin (VITAMIN B 12) 250 MCG LOZG Take by mouth.    . diphenhydramine-acetaminophen (TYLENOL PM) 25-500 MG TABS tablet Take 1-2 tablets by mouth at bedtime as needed (pain).    . ferrous sulfate 324 MG TBEC Take 324 mg by mouth daily with breakfast.    . levothyroxine (SYNTHROID) 125 MCG tablet Take 1 tablet (125 mcg total) by mouth daily before breakfast. 125 tablet 2  . lidocaine-prilocaine (EMLA) cream Apply 1 application topically as needed. 30 g 1  . mirtazapine (REMERON) 15 MG tablet TAKE 1 TABLET BY MOUTH EVERYDAY AT BEDTIME (Patient not taking: Reported on 03/31/2019) 90 tablet 1  . Multiple Vitamins-Minerals (PRESERVISION AREDS 2 PO) Take by  mouth.    . oxyCODONE-acetaminophen (PERCOCET/ROXICET) 5-325 MG tablet Take 1 tablet by mouth every 8 (eight) hours as needed for severe pain. 30 tablet 0  . pravastatin (PRAVACHOL) 40 MG tablet TAKE 1 TABLET BY MOUTH EVERY DAY (Patient taking differently: Take 40 mg by mouth at bedtime. ) 90 tablet 3  . prochlorperazine (COMPAZINE) 10 MG tablet Take 1 tablet (10 mg total) by mouth every 6 (six) hours as needed for nausea or vomiting. (Patient not taking: Reported on 04/21/2019) 30 tablet 1  . senna-docusate (SENNA S) 8.6-50 MG tablet 1 to 2 twice daily for constipation 120 tablet 5   No  current facility-administered medications for this visit.     SURGICAL HISTORY:  Past Surgical History:  Procedure Laterality Date  . 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 BELPHAROPTOSIS REPAIR Bilateral   . CATARACT EXTRACTION W/ INTRAOCULAR LENS  IMPLANT, BILATERAL    . COLONOSCOPY N/A 11/03/2014   Procedure: COLONOSCOPY;  Surgeon: Rogene Houston, MD;  Location: [facility];  Service: Endoscopy;  Laterality: N/A;  1225  . IR IMAGING GUIDED PORT INSERTION  07/11/2018  . KNEE CARTILAGE SURGERY Left    Left knee  . SKIN CANCER EXCISION  12/2014, 04/25/15  . VIDEO BRONCHOSCOPY Bilateral 05/09/2018   Procedure: VIDEO BRONCHOSCOPY WITH FLUORO;  Surgeon: Juanito Doom, MD;  Location: [facility];  Service: Cardiopulmonary;  Laterality: Bilateral;    REVIEW OF SYSTEMS:  A comprehensive review of systems was negative except for: Musculoskeletal: positive for arthralgias, back pain and bone pain   PHYSICAL EXAMINATION: General appearance: alert, cooperative and no distress Head: Normocephalic, without obvious abnormality, atraumatic Neck: no adenopathy, no JVD, supple, symmetrical, trachea midline and thyroid not enlarged, symmetric, no tenderness/mass/nodules Lymph nodes: Cervical, supraclavicular, and axillary nodes normal. Resp: clear to auscultation bilaterally Back: symmetric, no curvature. ROM normal. No CVA tenderness. Cardio: regular rate and rhythm, S1, S2 normal, no murmur, click, rub or gallop GI: soft, non-tender; bowel sounds normal; no masses,  no organomegaly Extremities: extremities normal, atraumatic, no cyanosis or edema  ECOG PERFORMANCE STATUS: 1 - Symptomatic but completely ambulatory  Blood pressure 111/74, pulse 77, temperature 98.5 F (36.9 C), temperature source Temporal, resp. rate 18, height 6\' 1"  (1.854 m), weight 237 lb 3.2 oz (107.6 kg), SpO2 100 %.  LABORATORY DATA: Lab Results  Component Value Date   WBC 5.6 06/23/2019   HGB 12.8 (L) 06/23/2019    HCT 37.5 (L) 06/23/2019   MCV 90.8 06/23/2019   PLT 152 06/23/2019      Chemistry      Component Value Date/Time   NA 142 06/23/2019 1028   NA 144 04/09/2018 1507   K 4.3 06/23/2019 1028   CL 107 06/23/2019 1028   CO2 26 06/23/2019 1028   BUN 17 06/23/2019 1028   BUN 15 04/09/2018 1507   CREATININE 1.02 06/23/2019 1028      Component Value Date/Time   CALCIUM 9.2 06/23/2019 1028   ALKPHOS 55 06/23/2019 1028   AST 16 06/23/2019 1028   ALT 13 06/23/2019 1028   BILITOT 0.7 06/23/2019 1028       RADIOGRAPHIC STUDIES: No results found.  ASSESSMENT AND PLAN: This is a very pleasant 73 years old white male with very light most smoking history recently diagnosed with stage IV (T3, N0, M1c) non-small cell lung cancer, squamous cell carcinoma based on the biopsy from the left axillary mass. He status post 4 cycles of induction systemic chemotherapy  with carboplatin, paclitaxel and Keytruda with partial response.  The patient is currently on maintenance treatment with single agent Keytruda status post 17 cycles. The patient has been tolerating this treatment well with no concerning adverse effects. I recommended for him to proceed with cycle #18 today as planned. I will see him back for follow-up visit in 3 weeks for evaluation before the next cycle of his treatment. For the hypothyroidism, I will continue to monitor his TSH closely and adjust his dose of levothyroxine accordingly. He was advised to call immediately if he has any concerning symptoms in the interval. The patient voices understanding of current disease status and treatment options and is in agreement with the current care plan. All questions were answered. The patient knows to call the clinic with any problems, questions or concerns. We can certainly see the patient much sooner if necessary.  Disclaimer: This note was dictated with voice recognition software. Similar sounding words can inadvertently be transcribed and may  not be corrected upon review.

## 2019-07-14 NOTE — Progress Notes (Signed)
HPI The patient presents for followup of palpitations.  In 2018 he wore a Holter that demonstrated PVCs and ventricular couplets.  Since I last saw him he has been diagnosed with and is being treated for Stage IV non-small cell lung cancer.     He is taking Keytruda now.  He had radiation.  He had chemotherapy.  He has lots of aches and complaints related to this.  He is describing more back problems.  He is having significant neuropathy.  He is fatigued.  Is not getting exercise because of Covid.  He is lost some weight.  He is not having any new chest pressure, neck or arm discomfort.  He said no new palpitations, presyncope or syncope.  He has had no edema.    Allergies  Allergen Reactions  . Trazodone And Nefazodone Palpitations    Current Outpatient Medications  Medication Sig Dispense Refill  . albuterol (PROVENTIL) (2.5 MG/3ML) 0.083% nebulizer solution Take 3 mLs (2.5 mg total) by nebulization every 6 (six) hours as needed for wheezing or shortness of breath. 75 mL 12  . ALPRAZolam (XANAX) 0.25 MG tablet Take 1 tablet (0.25 mg total) by mouth at bedtime as needed for anxiety. 15 tablet 0  . Apple Cider Vinegar 500 MG TABS Take by mouth.    . Artificial Tear Solution (SOOTHE XP OP) Place 1 drop into both eyes 2 (two) times daily.    . carvedilol (COREG) 12.5 MG tablet Take 1 tablet (12.5 mg total) by mouth 2 (two) times daily with a meal. 180 tablet 3  . Cyanocobalamin (VITAMIN B 12) 250 MCG LOZG Take by mouth.    . diphenhydramine-acetaminophen (TYLENOL PM) 25-500 MG TABS tablet Take 1-2 tablets by mouth at bedtime as needed (pain).    . ferrous sulfate 324 MG TBEC Take 324 mg by mouth daily with breakfast.    . levothyroxine (SYNTHROID) 125 MCG tablet Take 1 tablet (125 mcg total) by mouth daily before breakfast. 125 tablet 2  . lidocaine-prilocaine (EMLA) cream Apply 1 application topically as needed. 30 g 1  . mirtazapine (REMERON) 15 MG tablet TAKE 1 TABLET BY MOUTH  EVERYDAY AT BEDTIME 90 tablet 1  . Multiple Vitamins-Minerals (PRESERVISION AREDS 2 PO) Take by mouth.    . oxyCODONE-acetaminophen (PERCOCET/ROXICET) 5-325 MG tablet Take 1 tablet by mouth every 8 (eight) hours as needed for severe pain. 30 tablet 0  . prochlorperazine (COMPAZINE) 10 MG tablet Take 1 tablet (10 mg total) by mouth every 6 (six) hours as needed for nausea or vomiting. 30 tablet 1  . senna-docusate (SENNA S) 8.6-50 MG tablet 1 to 2 twice daily for constipation 120 tablet 5   No current facility-administered medications for this visit.     Past Medical History:  Diagnosis Date  . Abnormal nuclear stress test    December, 2013  . Anemia   . CAD (coronary artery disease)    Mild nonobstructive plaque in cath 2013  . Chest pain    December, 2013  . Dizziness   . Gout   . Hemorrhoid   . Hyperlipidemia   . Hypertension   . Skin cancer   . Spinal stenosis     Past Surgical History:  Procedure Laterality Date  . 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 BELPHAROPTOSIS REPAIR Bilateral   . CATARACT EXTRACTION W/ INTRAOCULAR LENS  IMPLANT, BILATERAL    . COLONOSCOPY N/A 11/03/2014   Procedure: COLONOSCOPY;  Surgeon: Rogene Houston,  MD;  [facility];  Service: Endoscopy;  Laterality: N/A;  1225  . IR IMAGING GUIDED PORT INSERTION  07/11/2018  . KNEE CARTILAGE SURGERY Left    Left knee  . SKIN CANCER EXCISION  12/2014, 04/25/15  . VIDEO BRONCHOSCOPY Bilateral 05/09/2018   Procedure: VIDEO BRONCHOSCOPY WITH FLUORO;  Surgeon: Juanito Doom, MD;  Location: [facility];  Service: Cardiopulmonary;  Laterality: Bilateral;    ROS:   As stated in the HPI and negative for all other systems.  PHYSICAL EXAM BP 110/70   Pulse 76   Ht 6\' 2"  (1.88 m)   Wt 237 lb (107.5 kg)   BMI 30.43 kg/m   GENERAL:  Well appearing NECK:  No jugular venous distention, waveform within normal limits, carotid upstroke brisk and symmetric, no bruits, no thyromegaly LUNGS:  Clear to  auscultation bilaterally CHEST:  Unremarkable HEART:  PMI not displaced or sustained,S1 and S2 within normal limits, no S3, no S4, no clicks, no rubs,no murmurs ABD:  Flat, positive bowel sounds normal in frequency in pitch, no bruits, no rebound, no guarding, no midline pulsatile mass, no hepatomegaly, no splenomegaly EXT:  2 plus pulses throughout, no edema, no cyanosis no clubbing   EKG: Sinus rhythm, rate 76, axis leftward, premature contraction, no acute ST-T wave changes.. 07/15/2019   Lab Results  Component Value Date   CHOL 127 05/28/2017   TRIG 162 (H) 05/28/2017   HDL 31 (L) 05/28/2017   LDLCALC 64 05/28/2017     ASSESSMENT AND PLAN   CAD -  This has been non obstructive CAD.  No change in therapy.   ESSENTIAL HYPERTENSION, BENIGN -  Blood pressure is controlled.  In fact it runs low.  He would like to try a lower dose of medications and I think it is reasonable for him to cut his carvedilol in half and see how he does with his blood pressures palpitations.   PALPITATIONS -   As above we will see how he does with his palpitations and an episode back up on the dose of his palpitations worsen.   DYSLIPIDEMIA -  I think is reasonable for him to come off his pravastatin to see if it is contributing to any of his diffuse complaints.  Given the fact that Keytruda can increase lipid levels I would like to check his cholesterol however in 2 months.Marland Kitchen

## 2019-07-14 NOTE — Patient Instructions (Signed)
[facility] Discharge Instructions for Patients Receiving Chemotherapy  Today you received the following chemotherapy agents Pembrolizumab (KEYTRUDA).  To help prevent nausea and vomiting after your treatment, we encourage you to take your nausea medication as prescribed.   If you develop nausea and vomiting that is not controlled by your nausea medication, call the clinic.   BELOW ARE SYMPTOMS THAT SHOULD BE REPORTED IMMEDIATELY:  *FEVER GREATER THAN 100.5 F  *CHILLS WITH OR WITHOUT FEVER  NAUSEA AND VOMITING THAT IS NOT CONTROLLED WITH YOUR NAUSEA MEDICATION  *UNUSUAL SHORTNESS OF BREATH  *UNUSUAL BRUISING OR BLEEDING  TENDERNESS IN MOUTH AND THROAT WITH OR WITHOUT PRESENCE OF ULCERS  *URINARY PROBLEMS  *BOWEL PROBLEMS  UNUSUAL RASH Items with * indicate a potential emergency and should be followed up as soon as possible.  Feel free to call the clinic should you have any questions or concerns. The clinic phone number is [phone].  Please show the CHEMO ALERT CARD at check-in to the Emergency Department and triage nurse.  Coronavirus (COVID-19) Are you at risk?  Are you at risk for the Coronavirus (COVID-19)?  To be considered HIGH RISK for Coronavirus (COVID-19), you have to meet the following criteria:  . Traveled to [place], [place], [place], [place] or [place]; or in the [place] to [place], [place], [place], or [place]; and have fever, cough, and shortness of breath within the last 2 weeks of travel OR . Been in close contact with a person diagnosed with COVID-19 within the last 2 weeks and have fever, cough, and shortness of breath . IF YOU DO NOT MEET THESE CRITERIA, YOU ARE CONSIDERED LOW RISK FOR COVID-19.  What to do if you are HIGH RISK for COVID-19?  . If you are having a medical emergency, call 911. . Seek medical care right away. Before you go to a doctor's office, urgent care or emergency department, call ahead and tell  them about your recent travel, contact with someone diagnosed with COVID-19, and your symptoms. You should receive instructions from your physician's office regarding next steps of care.  . When you arrive at healthcare provider, tell the healthcare staff immediately you have returned from visiting [place], [place], [place], [place] or [place]; or traveled in the [place] to [place], [place], [place], or [place]; in the last two weeks or you have been in close contact with a person diagnosed with COVID-19 in the last 2 weeks.   . Tell the health care staff about your symptoms: fever, cough and shortness of breath. . After you have been seen by a medical provider, you will be either: o Tested for (COVID-19) and discharged home on quarantine except to seek medical care if symptoms worsen, and asked to  - Stay home and avoid contact with others until you get your results (4-5 days)  - Avoid travel on public transportation if possible (such as bus, train, or airplane) or o Sent to the Emergency Department by EMS for evaluation, COVID-19 testing, and possible admission depending on your condition and test results.  What to do if you are LOW RISK for COVID-19?  Reduce your risk of any infection by using the same precautions used for avoiding the common cold or flu:  . Wash your hands often with soap and warm water for at least 20 seconds.  If soap and water are not readily available, use an alcohol-based hand sanitizer with at least 60% alcohol.  . If coughing or   sneezing, cover your mouth and nose by coughing or sneezing into the elbow areas of your shirt or coat, into a tissue or into your sleeve (not your hands). . Avoid shaking hands with others and consider head nods or verbal greetings only. . Avoid touching your eyes, nose, or mouth with unwashed hands.  . Avoid close contact with people who are sick. . Avoid places or events with large numbers of people in one location, like concerts or  sporting events. . Carefully consider travel plans you have or are making. . If you are planning any travel outside or inside the [place], visit the CDC's Travelers' Health webpage for the latest health notices. . If you have some symptoms but not all symptoms, continue to monitor at home and seek medical attention if your symptoms worsen. . If you are having a medical emergency, call 911.   ADDITIONAL HEALTHCARE OPTIONS FOR PATIENTS  [facility] / e-Visit: [email or web address]         [facility]: [phone]  [facility]: [phone]                   [facility]: [phone]    

## 2019-07-15 ENCOUNTER — Encounter: Payer: Self-pay | Admitting: Cardiology

## 2019-07-15 ENCOUNTER — Ambulatory Visit (INDEPENDENT_AMBULATORY_CARE_PROVIDER_SITE_OTHER): Payer: Medicare Other | Admitting: Cardiology

## 2019-07-15 VITALS — BP 110/70 | HR 76 | Ht 74.0 in | Wt 237.0 lb

## 2019-07-15 DIAGNOSIS — E785 Hyperlipidemia, unspecified: Secondary | ICD-10-CM

## 2019-07-15 DIAGNOSIS — I10 Essential (primary) hypertension: Secondary | ICD-10-CM | POA: Diagnosis not present

## 2019-07-15 DIAGNOSIS — R002 Palpitations: Secondary | ICD-10-CM

## 2019-07-15 MED ORDER — CARVEDILOL 12.5 MG PO TABS: 12.5000 mg | ORAL_TABLET | Freq: Two times a day (BID) | ORAL | 3 refills | Status: DC

## 2019-07-15 NOTE — Patient Instructions (Signed)
Medication Instructions:  You may discontinue your Pravastatin. Decrease Carvedilol to 12.5 mg one tablet twice a day. Continue all other medications as listed.  *If you need a refill on your cardiac medications before your next appointment, please call your pharmacy*  Lab Work: Please have fasting lab work (Lipid) in 2 months at [facility]. If you have labs (blood work) drawn today and your tests are completely normal, you will receive your results only by: Marland Kitchen MyChart Message (if you have MyChart) OR . A paper copy in the mail If you have any lab test that is abnormal or we need to change your treatment, we will call you to review the results.  Follow-Up: At [facility], you and your health needs are our priority.  As part of our continuing mission to provide you with exceptional heart care, we have created designated Provider Care Teams.  These Care Teams include your primary Cardiologist (physician) and Advanced Practice Providers (APPs -  Physician Assistants and Nurse Practitioners) who all work together to provide you with the care you need, when you need it.  Your next appointment:   12 months  The format for your next appointment:   In Person  Provider:   Dr [name]  Thank you for choosing [facility]!!

## 2019-07-28 ENCOUNTER — Other Ambulatory Visit: Payer: Self-pay | Admitting: Oncology

## 2019-07-28 DIAGNOSIS — M5136 Other intervertebral disc degeneration, lumbar region: Secondary | ICD-10-CM | POA: Diagnosis not present

## 2019-08-03 ENCOUNTER — Other Ambulatory Visit: Payer: Self-pay | Admitting: *Deleted

## 2019-08-03 DIAGNOSIS — E039 Hypothyroidism, unspecified: Secondary | ICD-10-CM

## 2019-08-03 DIAGNOSIS — C7801 Secondary malignant neoplasm of right lung: Secondary | ICD-10-CM

## 2019-08-04 ENCOUNTER — Telehealth: Payer: Self-pay | Admitting: Medical Oncology

## 2019-08-04 ENCOUNTER — Encounter: Payer: Self-pay | Admitting: Internal Medicine

## 2019-08-04 ENCOUNTER — Inpatient Hospital Stay: Payer: Medicare Other

## 2019-08-04 ENCOUNTER — Other Ambulatory Visit: Payer: Self-pay

## 2019-08-04 ENCOUNTER — Inpatient Hospital Stay (HOSPITAL_BASED_OUTPATIENT_CLINIC_OR_DEPARTMENT_OTHER): Payer: Medicare Other | Admitting: Internal Medicine

## 2019-08-04 VITALS — BP 126/83 | HR 73 | Temp 97.8°F | Resp 18 | Ht 74.0 in | Wt 235.1 lb

## 2019-08-04 DIAGNOSIS — C773 Secondary and unspecified malignant neoplasm of axilla and upper limb lymph nodes: Secondary | ICD-10-CM | POA: Diagnosis not present

## 2019-08-04 DIAGNOSIS — E039 Hypothyroidism, unspecified: Secondary | ICD-10-CM

## 2019-08-04 DIAGNOSIS — Z95828 Presence of other vascular implants and grafts: Secondary | ICD-10-CM

## 2019-08-04 DIAGNOSIS — C3490 Malignant neoplasm of unspecified part of unspecified bronchus or lung: Secondary | ICD-10-CM | POA: Diagnosis not present

## 2019-08-04 DIAGNOSIS — Z923 Personal history of irradiation: Secondary | ICD-10-CM | POA: Diagnosis not present

## 2019-08-04 DIAGNOSIS — C3491 Malignant neoplasm of unspecified part of right bronchus or lung: Secondary | ICD-10-CM

## 2019-08-04 DIAGNOSIS — Z9221 Personal history of antineoplastic chemotherapy: Secondary | ICD-10-CM | POA: Diagnosis not present

## 2019-08-04 DIAGNOSIS — C7801 Secondary malignant neoplasm of right lung: Secondary | ICD-10-CM | POA: Diagnosis not present

## 2019-08-04 DIAGNOSIS — Z5112 Encounter for antineoplastic immunotherapy: Secondary | ICD-10-CM | POA: Diagnosis not present

## 2019-08-04 DIAGNOSIS — M549 Dorsalgia, unspecified: Secondary | ICD-10-CM | POA: Diagnosis not present

## 2019-08-04 LAB — CMP (CANCER CENTER ONLY)
ALT: 20 U/L (ref 0–44)
AST: 19 U/L (ref 15–41)
Albumin: 4.1 g/dL (ref 3.5–5.0)
Alkaline Phosphatase: 55 U/L (ref 38–126)
Anion gap: 10 (ref 5–15)
BUN: 18 mg/dL (ref 8–23)
CO2: 23 mmol/L (ref 22–32)
Calcium: 9.1 mg/dL (ref 8.9–10.3)
Chloride: 106 mmol/L (ref 98–111)
Creatinine: 1.04 mg/dL (ref 0.61–1.24)
GFR, Est AFR Am: >60 mL/min (ref >60)
GFR, Estimated: >60 mL/min (ref >60)
Glucose, Bld: 96 mg/dL (ref 70–99)
Potassium: 4.5 mmol/L (ref 3.5–5.1)
Sodium: 139 mmol/L (ref 135–145)
Total Bilirubin: 0.7 mg/dL (ref 0.3–1.2)
Total Protein: 7.7 g/dL (ref 6.5–8.1)

## 2019-08-04 LAB — CBC WITH DIFFERENTIAL (CANCER CENTER ONLY)
Abs Immature Granulocytes: 0.02 10*3/uL (ref 0.00–0.07)
Basophils Absolute: 0 10*3/uL (ref 0.0–0.1)
Basophils Relative: 1 %
Eosinophils Absolute: 0.3 10*3/uL (ref 0.0–0.5)
Eosinophils Relative: 6 %
HCT: 38.2 % — ABNORMAL LOW (ref 39.0–52.0)
Hemoglobin: 12.8 g/dL — ABNORMAL LOW (ref 13.0–17.0)
Immature Granulocytes: 0 %
Lymphocytes Relative: 21 %
Lymphs Abs: 1.2 10*3/uL (ref 0.7–4.0)
MCH: 31.1 pg (ref 26.0–34.0)
MCHC: 33.5 g/dL (ref 30.0–36.0)
MCV: 92.7 fL (ref 80.0–100.0)
Monocytes Absolute: 0.7 10*3/uL (ref 0.1–1.0)
Monocytes Relative: 13 %
Neutro Abs: 3.3 10*3/uL (ref 1.7–7.7)
Neutrophils Relative %: 59 %
Platelet Count: 161 10*3/uL (ref 150–400)
RBC: 4.12 MIL/uL — ABNORMAL LOW (ref 4.22–5.81)
RDW: 12.6 % (ref 11.5–15.5)
WBC Count: 5.5 10*3/uL (ref 4.0–10.5)
nRBC: 0 % (ref 0.0–0.2)

## 2019-08-04 LAB — TSH: TSH: 9.07 u[IU]/mL — ABNORMAL HIGH (ref 0.320–4.118)

## 2019-08-04 MED ORDER — SODIUM CHLORIDE 0.9 % IV SOLN
200.0000 mg | Freq: Once | INTRAVENOUS | Status: AC
  Administered 2019-08-04: 200 mg via INTRAVENOUS
  Filled 2019-08-04: qty 8

## 2019-08-04 MED ORDER — HEPARIN SOD (PORK) LOCK FLUSH 100 UNIT/ML IV SOLN
500.0000 [IU] | Freq: Once | INTRAVENOUS | Status: AC | PRN
  Administered 2019-08-04: 500 [IU]
  Filled 2019-08-04: qty 5

## 2019-08-04 MED ORDER — SODIUM CHLORIDE 0.9 % IV SOLN
Freq: Once | INTRAVENOUS | Status: AC
  Administered 2019-08-04: 12:00:00 via INTRAVENOUS
  Filled 2019-08-04: qty 250

## 2019-08-04 MED ORDER — LEVOTHYROXINE SODIUM 150 MCG PO TABS: 150.0000 ug | ORAL_TABLET | Freq: Every day | ORAL | 0 refills | Status: DC

## 2019-08-04 MED ORDER — SODIUM CHLORIDE 0.9% FLUSH
10.0000 mL | Freq: Once | INTRAVENOUS | Status: AC
  Administered 2019-08-04: 10 mL
  Filled 2019-08-04: qty 10

## 2019-08-04 MED ORDER — SODIUM CHLORIDE 0.9% FLUSH
10.0000 mL | INTRAVENOUS | Status: DC | PRN
  Administered 2019-08-04: 10 mL
  Filled 2019-08-04: qty 10

## 2019-08-04 NOTE — Patient Instructions (Signed)
[facility] Discharge Instructions for Patients Receiving Chemotherapy  Today you received the following chemotherapy agents:  Keytruda.  To help prevent nausea and vomiting after your treatment, we encourage you to take your nausea medication as directed.   If you develop nausea and vomiting that is not controlled by your nausea medication, call the clinic.   BELOW ARE SYMPTOMS THAT SHOULD BE REPORTED IMMEDIATELY:  *FEVER GREATER THAN 100.5 F  *CHILLS WITH OR WITHOUT FEVER  NAUSEA AND VOMITING THAT IS NOT CONTROLLED WITH YOUR NAUSEA MEDICATION  *UNUSUAL SHORTNESS OF BREATH  *UNUSUAL BRUISING OR BLEEDING  TENDERNESS IN MOUTH AND THROAT WITH OR WITHOUT PRESENCE OF ULCERS  *URINARY PROBLEMS  *BOWEL PROBLEMS  UNUSUAL RASH Items with * indicate a potential emergency and should be followed up as soon as possible.  Feel free to call the clinic should you have any questions or concerns. The clinic phone number is [phone].  Please show the CHEMO ALERT CARD at check-in to the Emergency Department and triage nurse.    

## 2019-08-04 NOTE — Patient Instructions (Signed)

## 2019-08-04 NOTE — Progress Notes (Signed)
[facility] [phone]   [phone]  OFFICE PROGRESS NOTE  Travis Padilla, [address] [place] [address]  DIAGNOSIS: Stage IV (T3, N0, M1c) non-small cell lung cancer, squamous cell carcinoma presented with large right infrahilar mass in addition to left upper lobe lung nodule as well as left axillary mass with left axillary lymph node diagnosed in August 2019.  PRIOR THERAPY:  1) Palliative radiotherapy to the right infrahilar mass as well as the axillary mass under the care of Dr. Lisbeth Renshaw. 2) Systemic chemotherapy with carboplatin for AUC of 5, paclitaxel 175 mg/M2 and Keytruda 200 mg IV every 3 weeks status post 4 cycles.  CURRENT THERAPY: Maintenance immunotherapy with single agent Keytruda 200 mg IV every 3 weeks status post 18 cycles.  INTERVAL HISTORY: Travis Padilla 73 y.o. male returns to the clinic today for follow-up visit.  The patient is feeling fine today with no concerning complaints except for arthralgia and aching pain especially in the back.  He denied having any current chest pain, shortness of breath, cough or hemoptysis.  He denied having any fever or chills.  He has no nausea, vomiting, diarrhea or constipation.  He denied having any headache or visual changes.  The patient is here today for evaluation before starting cycle #19 of his treatment.  MEDICAL HISTORY: Past Medical History:  Diagnosis Date  . Abnormal nuclear stress test    December, 2013  . Anemia   . CAD (coronary artery disease)    Mild nonobstructive plaque in cath 2013  . Chest pain    December, 2013  . Dizziness   . Gout   . Hemorrhoid   . Hyperlipidemia   . Hypertension   . Skin cancer   . Spinal stenosis     ALLERGIES:  is allergic to trazodone and nefazodone.  MEDICATIONS:  Current Outpatient Medications  Medication Sig Dispense Refill  . albuterol (PROVENTIL) (2.5 MG/3ML) 0.083% nebulizer solution Take 3 mLs (2.5 mg total) by  nebulization every 6 (six) hours as needed for wheezing or shortness of breath. 75 mL 12  . ALPRAZolam (XANAX) 0.25 MG tablet Take 1 tablet (0.25 mg total) by mouth at bedtime as needed for anxiety. 15 tablet 0  . Apple Cider Vinegar 500 MG TABS Take by mouth.    . Artificial Tear Solution (SOOTHE XP OP) Place 1 drop into both eyes 2 (two) times daily.    . carvedilol (COREG) 12.5 MG tablet Take 1 tablet (12.5 mg total) by mouth 2 (two) times daily with a meal. 180 tablet 3  . Cyanocobalamin (VITAMIN B 12) 250 MCG LOZG Take by mouth.    . diphenhydramine-acetaminophen (TYLENOL PM) 25-500 MG TABS tablet Take 1-2 tablets by mouth at bedtime as needed (pain).    . ferrous sulfate 324 MG TBEC Take 324 mg by mouth daily with breakfast.    . levothyroxine (SYNTHROID) 125 MCG tablet Take 1 tablet (125 mcg total) by mouth daily before breakfast. 125 tablet 2  . lidocaine-prilocaine (EMLA) cream Apply 1 application topically as needed. 30 g 1  . mirtazapine (REMERON) 15 MG tablet TAKE 1 TABLET BY MOUTH EVERYDAY AT BEDTIME 90 tablet 1  . Multiple Vitamins-Minerals (PRESERVISION AREDS 2 PO) Take by mouth.    . oxyCODONE-acetaminophen (PERCOCET/ROXICET) 5-325 MG tablet Take 1 tablet by mouth every 8 (eight) hours as needed for severe pain. 30 tablet 0  . prochlorperazine (COMPAZINE) 10 MG tablet Take 1 tablet (  10 mg total) by mouth every 6 (six) hours as needed for nausea or vomiting. 30 tablet 1  . senna-docusate (SENNA S) 8.6-50 MG tablet 1 to 2 twice daily for constipation 120 tablet 5   No current facility-administered medications for this visit.     SURGICAL HISTORY:  Past Surgical History:  Procedure Laterality Date  . 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 BELPHAROPTOSIS REPAIR Bilateral   . CATARACT EXTRACTION W/ INTRAOCULAR LENS  IMPLANT, BILATERAL    . COLONOSCOPY N/A 11/03/2014   Procedure: COLONOSCOPY;  Surgeon: Rogene Houston, MD;  [facility];  Service: Endoscopy;  Laterality:  N/A;  1225  . IR IMAGING GUIDED PORT INSERTION  07/11/2018  . KNEE CARTILAGE SURGERY Left    Left knee  . SKIN CANCER EXCISION  12/2014, 04/25/15  . VIDEO BRONCHOSCOPY Bilateral 05/09/2018   Procedure: VIDEO BRONCHOSCOPY WITH FLUORO;  Surgeon: Juanito Doom, MD;  [facility]: [facility];  Service: Cardiopulmonary;  Laterality: Bilateral;    REVIEW OF SYSTEMS:  Constitutional: positive for fatigue Eyes: negative Ears, nose, mouth, throat, and face: negative Respiratory: negative Cardiovascular: negative Gastrointestinal: negative Genitourinary:negative Integument/breast: negative Hematologic/lymphatic: negative Musculoskeletal:positive for arthralgias and back pain Neurological: negative Behavioral/Psych: negative Endocrine: negative Allergic/Immunologic: negative   PHYSICAL EXAMINATION: General appearance: alert, cooperative and no distress Head: Normocephalic, without obvious abnormality, atraumatic Neck: no adenopathy, no JVD, supple, symmetrical, trachea midline and thyroid not enlarged, symmetric, no tenderness/mass/nodules Lymph nodes: Cervical, supraclavicular, and axillary nodes normal. Resp: clear to auscultation bilaterally Back: symmetric, no curvature. ROM normal. No CVA tenderness. Cardio: regular rate and rhythm, S1, S2 normal, no murmur, click, rub or gallop GI: soft, non-tender; bowel sounds normal; no masses,  no organomegaly Extremities: extremities normal, atraumatic, no cyanosis or edema Neurologic: Alert and oriented X 3, normal strength and tone. Normal symmetric reflexes. Normal coordination and gait  ECOG PERFORMANCE STATUS: 1 - Symptomatic but completely ambulatory  Blood pressure 126/83, pulse 73, temperature 97.8 F (36.6 C), temperature source Temporal, resp. rate 18, height 6\' 2"  (1.88 m), weight 235 lb 1.6 oz (106.6 kg), SpO2 99 %.  LABORATORY DATA: Lab Results  Component Value Date   WBC 5.5 08/04/2019   HGB 12.8 (L) 08/04/2019   HCT 38.2 (L)  08/04/2019   MCV 92.7 08/04/2019   PLT 161 08/04/2019      Chemistry      Component Value Date/Time   NA 140 07/14/2019 0832   NA 144 04/09/2018 1507   K 4.2 07/14/2019 0832   CL 108 07/14/2019 0832   CO2 23 07/14/2019 0832   BUN 18 07/14/2019 0832   BUN 15 04/09/2018 1507   CREATININE 0.99 07/14/2019 0832      Component Value Date/Time   CALCIUM 9.1 07/14/2019 0832   ALKPHOS 58 07/14/2019 0832   AST 18 07/14/2019 0832   ALT 16 07/14/2019 0832   BILITOT 0.5 07/14/2019 0832       RADIOGRAPHIC STUDIES: No results found.  ASSESSMENT AND PLAN: This is a very pleasant 73 years old white male with very light most smoking history recently diagnosed with stage IV (T3, N0, M1c) non-small cell lung cancer, squamous cell carcinoma based on the biopsy from the left axillary mass. He status post 4 cycles of induction systemic chemotherapy with carboplatin, paclitaxel and Keytruda with partial response.  The patient is currently on maintenance treatment with single agent Keytruda status post 18 cycles. Has been tolerating this treatment well with no concerning adverse  effects. I recommended for him to proceed with cycle #19 today as planned. I will see him back for follow-up visit in 3 weeks for evaluation with repeat CT scan of the chest, abdomen pelvis for restaging of his disease. For the hypothyroidism, I will adjust his medication and increase his levothyroxine to 150 mcg p.o. daily.  We will continue to monitor his TSH closely on upcoming blood work. The patient was advised to call immediately if he has any other concerning symptoms in the interval..  The patient voices understanding of current disease status and treatment options and is in agreement with the current care plan. All questions were answered. The patient knows to call the clinic with any problems, questions or concerns. We can certainly see the patient much sooner if necessary.  Disclaimer: This note was dictated with  voice recognition software. Similar sounding words can inadvertently be transcribed and may not be corrected upon review.

## 2019-08-04 NOTE — Telephone Encounter (Signed)
-----   Message from Curt Bears, MD sent at 08/04/2019  2:26 PM EST ----- I increased his dose of levothyroxine to 150 mcg p.o. daily.  I did send prescription to his pharmacy.  Please let the patient know.  Thank you. ----- Message ----- From: Buel Ream, Lab [facility] [place] Sent: 08/04/2019  10:54 AM EST To: Curt Bears, MD

## 2019-08-04 NOTE — Telephone Encounter (Signed)
Pt.notified

## 2019-08-11 DIAGNOSIS — M5136 Other intervertebral disc degeneration, lumbar region: Secondary | ICD-10-CM | POA: Diagnosis not present

## 2019-08-17 DIAGNOSIS — Z961 Presence of intraocular lens: Secondary | ICD-10-CM | POA: Diagnosis not present

## 2019-08-21 ENCOUNTER — Ambulatory Visit (HOSPITAL_COMMUNITY)
Admission: RE | Admit: 2019-08-21 | Discharge: 2019-08-21 | Disposition: A | Discharge status: Home / Self Care | Payer: Medicare Other | Source: Ambulatory Visit | Attending: Internal Medicine | Admitting: Internal Medicine

## 2019-08-21 ENCOUNTER — Other Ambulatory Visit: Payer: Self-pay

## 2019-08-21 DIAGNOSIS — C3490 Malignant neoplasm of unspecified part of unspecified bronchus or lung: Secondary | ICD-10-CM | POA: Diagnosis not present

## 2019-08-21 MED ORDER — HEPARIN SOD (PORK) LOCK FLUSH 100 UNIT/ML IV SOLN
INTRAVENOUS | Status: AC
  Administered 2019-08-21: 500 [IU] via INTRAVENOUS
  Filled 2019-08-21: qty 5

## 2019-08-21 MED ORDER — SODIUM CHLORIDE (PF) 0.9 % IJ SOLN
INTRAMUSCULAR | Status: AC
  Filled 2019-08-21: qty 50

## 2019-08-21 MED ORDER — HEPARIN SOD (PORK) LOCK FLUSH 100 UNIT/ML IV SOLN: 500.0000 [IU] | Freq: Once | INTRAVENOUS | Status: AC

## 2019-08-21 MED ORDER — IOHEXOL 300 MG/ML  SOLN
100.0000 mL | Freq: Once | INTRAMUSCULAR | Status: AC | PRN
  Administered 2019-08-21: 100 mL via INTRAVENOUS

## 2019-08-25 ENCOUNTER — Inpatient Hospital Stay: Payer: Medicare Other

## 2019-08-25 ENCOUNTER — Inpatient Hospital Stay: Payer: Medicare Other | Attending: Oncology

## 2019-08-25 ENCOUNTER — Other Ambulatory Visit: Payer: Self-pay

## 2019-08-25 ENCOUNTER — Inpatient Hospital Stay (HOSPITAL_BASED_OUTPATIENT_CLINIC_OR_DEPARTMENT_OTHER): Payer: Medicare Other | Admitting: Internal Medicine

## 2019-08-25 ENCOUNTER — Encounter: Payer: Self-pay | Admitting: *Deleted

## 2019-08-25 ENCOUNTER — Other Ambulatory Visit: Payer: Self-pay | Admitting: Medical Oncology

## 2019-08-25 ENCOUNTER — Encounter: Payer: Self-pay | Admitting: Internal Medicine

## 2019-08-25 VITALS — BP 122/83 | HR 72 | Temp 97.9°F | Resp 18 | Ht 74.0 in | Wt 239.2 lb

## 2019-08-25 DIAGNOSIS — Z5112 Encounter for antineoplastic immunotherapy: Secondary | ICD-10-CM

## 2019-08-25 DIAGNOSIS — C3490 Malignant neoplasm of unspecified part of unspecified bronchus or lung: Secondary | ICD-10-CM

## 2019-08-25 DIAGNOSIS — E039 Hypothyroidism, unspecified: Secondary | ICD-10-CM | POA: Diagnosis not present

## 2019-08-25 DIAGNOSIS — Z79899 Other long term (current) drug therapy: Secondary | ICD-10-CM | POA: Diagnosis not present

## 2019-08-25 DIAGNOSIS — C3491 Malignant neoplasm of unspecified part of right bronchus or lung: Secondary | ICD-10-CM | POA: Diagnosis not present

## 2019-08-25 DIAGNOSIS — E785 Hyperlipidemia, unspecified: Secondary | ICD-10-CM | POA: Insufficient documentation

## 2019-08-25 DIAGNOSIS — Z9221 Personal history of antineoplastic chemotherapy: Secondary | ICD-10-CM | POA: Insufficient documentation

## 2019-08-25 DIAGNOSIS — Z95828 Presence of other vascular implants and grafts: Secondary | ICD-10-CM

## 2019-08-25 DIAGNOSIS — I10 Essential (primary) hypertension: Secondary | ICD-10-CM | POA: Insufficient documentation

## 2019-08-25 DIAGNOSIS — M549 Dorsalgia, unspecified: Secondary | ICD-10-CM | POA: Insufficient documentation

## 2019-08-25 DIAGNOSIS — C7801 Secondary malignant neoplasm of right lung: Secondary | ICD-10-CM

## 2019-08-25 DIAGNOSIS — Z923 Personal history of irradiation: Secondary | ICD-10-CM | POA: Diagnosis not present

## 2019-08-25 DIAGNOSIS — Z85828 Personal history of other malignant neoplasm of skin: Secondary | ICD-10-CM | POA: Insufficient documentation

## 2019-08-25 DIAGNOSIS — I2510 Atherosclerotic heart disease of native coronary artery without angina pectoris: Secondary | ICD-10-CM | POA: Diagnosis not present

## 2019-08-25 DIAGNOSIS — C3412 Malignant neoplasm of upper lobe, left bronchus or lung: Secondary | ICD-10-CM | POA: Diagnosis not present

## 2019-08-25 LAB — CBC WITH DIFFERENTIAL (CANCER CENTER ONLY)
Abs Immature Granulocytes: 0.03 10*3/uL (ref 0.00–0.07)
Basophils Absolute: 0 10*3/uL (ref 0.0–0.1)
Basophils Relative: 1 %
Eosinophils Absolute: 0.3 10*3/uL (ref 0.0–0.5)
Eosinophils Relative: 6 %
HCT: 36.5 % — ABNORMAL LOW (ref 39.0–52.0)
Hemoglobin: 12.5 g/dL — ABNORMAL LOW (ref 13.0–17.0)
Immature Granulocytes: 1 %
Lymphocytes Relative: 19 %
Lymphs Abs: 1 10*3/uL (ref 0.7–4.0)
MCH: 31.3 pg (ref 26.0–34.0)
MCHC: 34.2 g/dL (ref 30.0–36.0)
MCV: 91.5 fL (ref 80.0–100.0)
Monocytes Absolute: 0.7 10*3/uL (ref 0.1–1.0)
Monocytes Relative: 14 %
Neutro Abs: 3 10*3/uL (ref 1.7–7.7)
Neutrophils Relative %: 59 %
Platelet Count: 157 10*3/uL (ref 150–400)
RBC: 3.99 MIL/uL — ABNORMAL LOW (ref 4.22–5.81)
RDW: 12.1 % (ref 11.5–15.5)
WBC Count: 5.1 10*3/uL (ref 4.0–10.5)
nRBC: 0 % (ref 0.0–0.2)

## 2019-08-25 LAB — CMP (CANCER CENTER ONLY)
ALT: 13 U/L (ref 0–44)
AST: 15 U/L (ref 15–41)
Albumin: 4 g/dL (ref 3.5–5.0)
Alkaline Phosphatase: 62 U/L (ref 38–126)
Anion gap: 8 (ref 5–15)
BUN: 20 mg/dL (ref 8–23)
CO2: 25 mmol/L (ref 22–32)
Calcium: 9.1 mg/dL (ref 8.9–10.3)
Chloride: 108 mmol/L (ref 98–111)
Creatinine: 0.89 mg/dL (ref 0.61–1.24)
GFR, Est AFR Am: >60 mL/min (ref >60)
GFR, Estimated: >60 mL/min (ref >60)
Glucose, Bld: 97 mg/dL (ref 70–99)
Potassium: 4.3 mmol/L (ref 3.5–5.1)
Sodium: 141 mmol/L (ref 135–145)
Total Bilirubin: 0.4 mg/dL (ref 0.3–1.2)
Total Protein: 7.5 g/dL (ref 6.5–8.1)

## 2019-08-25 MED ORDER — HEPARIN SOD (PORK) LOCK FLUSH 100 UNIT/ML IV SOLN
500.0000 [IU] | Freq: Once | INTRAVENOUS | Status: AC | PRN
  Administered 2019-08-25: 500 [IU]
  Filled 2019-08-25: qty 5

## 2019-08-25 MED ORDER — SODIUM CHLORIDE 0.9% FLUSH
10.0000 mL | Freq: Once | INTRAVENOUS | Status: AC
  Administered 2019-08-25: 10 mL
  Filled 2019-08-25: qty 10

## 2019-08-25 MED ORDER — SODIUM CHLORIDE 0.9 % IV SOLN
200.0000 mg | Freq: Once | INTRAVENOUS | Status: AC
  Administered 2019-08-25: 200 mg via INTRAVENOUS
  Filled 2019-08-25: qty 8

## 2019-08-25 MED ORDER — SODIUM CHLORIDE 0.9 % IV SOLN
Freq: Once | INTRAVENOUS | Status: AC
  Filled 2019-08-25: qty 250

## 2019-08-25 MED ORDER — SODIUM CHLORIDE 0.9% FLUSH
10.0000 mL | INTRAVENOUS | Status: DC | PRN
  Administered 2019-08-25: 10 mL
  Filled 2019-08-25: qty 10

## 2019-08-25 NOTE — Patient Instructions (Signed)

## 2019-08-25 NOTE — Patient Instructions (Signed)
[facility] Discharge Instructions for Patients Receiving Chemotherapy  Today you received the following Immunotherapy agent: Pembrolizumab Beryle Flock)  To help prevent nausea and vomiting after your treatment, we encourage you to take your nausea medication as directed by your MD.   If you develop nausea and vomiting that is not controlled by your nausea medication, call the clinic.   BELOW ARE SYMPTOMS THAT SHOULD BE REPORTED IMMEDIATELY:  *FEVER GREATER THAN 100.5 F  *CHILLS WITH OR WITHOUT FEVER  NAUSEA AND VOMITING THAT IS NOT CONTROLLED WITH YOUR NAUSEA MEDICATION  *UNUSUAL SHORTNESS OF BREATH  *UNUSUAL BRUISING OR BLEEDING  TENDERNESS IN MOUTH AND THROAT WITH OR WITHOUT PRESENCE OF ULCERS  *URINARY PROBLEMS  *BOWEL PROBLEMS  UNUSUAL RASH Items with * indicate a potential emergency and should be followed up as soon as possible.  Feel free to call the clinic should you have any questions or concerns. The clinic phone number is [phone].  Please show the [facility] at check-in to the Emergency Department and triage nurse.  Coronavirus (COVID-19) Are you at risk?  Are you at risk for the Coronavirus (COVID-19)?  To be considered HIGH RISK for Coronavirus (COVID-19), you have to meet the following criteria:  . Traveled to [place], [place], [place], [place] or [place]; or in the [place] to [place], [place], [place], or [place]; and have fever, cough, and shortness of breath within the last 2 weeks of travel OR . Been in close contact with a person diagnosed with COVID-19 within the last 2 weeks and have fever, cough, and shortness of breath . IF YOU DO NOT MEET THESE CRITERIA, YOU ARE CONSIDERED LOW RISK FOR COVID-19.  What to do if you are HIGH RISK for COVID-19?  Marland Kitchen If you are having a medical emergency, call 911. . Seek medical care right away. Before you go to a doctor's office, urgent care or emergency department, call ahead and  tell them about your recent travel, contact with someone diagnosed with COVID-19, and your symptoms. You should receive instructions from your physician's office regarding next steps of care.  . When you arrive at healthcare provider, tell the healthcare staff immediately you have returned from visiting [place], [place], [place], [place] or [place]; or traveled in the [place] to [place], [place], [place], or [place]; in the last two weeks or you have been in close contact with a person diagnosed with COVID-19 in the last 2 weeks.   . Tell the health care staff about your symptoms: fever, cough and shortness of breath. . After you have been seen by a medical provider, you will be either: o Tested for (COVID-19) and discharged home on quarantine except to seek medical care if symptoms worsen, and asked to  - Stay home and avoid contact with others until you get your results (4-5 days)  - Avoid travel on public transportation if possible (such as bus, train, or airplane) or o Sent to the Emergency Department by EMS for evaluation, COVID-19 testing, and possible admission depending on your condition and test results.  What to do if you are LOW RISK for COVID-19?  Reduce your risk of any infection by using the same precautions used for avoiding the common cold or flu:  Marland Kitchen Wash your hands often with soap and warm water for at least 20 seconds.  If soap and water are not readily available, use an alcohol-based hand sanitizer with at least 60% alcohol.  Marland Kitchen  If coughing or sneezing, cover your mouth and nose by coughing or sneezing into the elbow areas of your shirt or coat, into a tissue or into your sleeve (not your hands). . Avoid shaking hands with others and consider head nods or verbal greetings only. . Avoid touching your eyes, nose, or mouth with unwashed hands.  . Avoid close contact with people who are sick. . Avoid places or events with large numbers of people in one location, like  concerts or sporting events. . Carefully consider travel plans you have or are making. . If you are planning any travel outside or inside the [place], visit the CDC's Travelers' Health webpage for the latest health notices. . If you have some symptoms but not all symptoms, continue to monitor at home and seek medical attention if your symptoms worsen. . If you are having a medical emergency, call 911.   [place] / e-Visit: [email or web address]         [facility]: [facility]: [phone]                   [facility]: [phone]

## 2019-08-25 NOTE — Progress Notes (Signed)
Oncology Nurse Navigator Documentation  Oncology Nurse Navigator Flowsheets 08/25/2019  Abnormal Finding Date -  Confirmed Diagnosis Date -  Navigator Location [facility]  Referral Date to RadOnc/MedOnc -  Navigator Encounter Type Clinic/MDC/I spoke with patient at clinic today.  He is doing well without complaints.  I offered support and encouragement with treatment.    Telephone -  Patient Visit Type MedOnc  Treatment Phase Treatment  Barriers/Navigation Needs -  Education -  Interventions Psycho-Social Support  Acuity Level 2-Minimal Needs (1-2 Barriers Identified)  Coordination of Care -  Education Method -  Time Spent with Patient 15

## 2019-08-25 NOTE — Progress Notes (Signed)
[facility] [phone]   Fax:([phone]  OFFICE PROGRESS NOTE  [name], [address] [place] [address]  DIAGNOSIS: Stage IV (T3, N0, M1c) non-small cell lung cancer, squamous cell carcinoma presented with large right infrahilar mass in addition to left upper lobe lung nodule as well as left axillary mass with left axillary lymph node diagnosed in August 2019.  PRIOR THERAPY:  1) Palliative radiotherapy to the right infrahilar mass as well as the axillary mass under the care of Dr. Lisbeth Renshaw. 2) Systemic chemotherapy with carboplatin for AUC of 5, paclitaxel 175 mg/M2 and Keytruda 200 mg IV every 3 weeks status post 4 cycles.  CURRENT THERAPY: Maintenance immunotherapy with single agent Keytruda 200 mg IV every 3 weeks status post 19 cycles.  INTERVAL HISTORY: Travis Padilla 73 y.o. male returns to the clinic today for follow-up visit.  The patient is feeling fine today with no concerning complaints except for the low back pain.  He was seen by his orthopedic surgeon and found to have degenerative disc disease as well as bulging disc.  He is currently undergoing physical therapy.  He denied having any current chest pain, shortness of breath, cough or hemoptysis.  He denied having any nausea, vomiting, diarrhea or constipation.  He has no headache or visual changes.  He has no recent weight loss or night sweats.  The patient has been tolerating his treatment with Keytruda fairly well.  He is here today for evaluation with repeat CT scan of the chest, abdomen pelvis for restaging of his disease.   MEDICAL HISTORY: Past Medical History:  Diagnosis Date  . Abnormal nuclear stress test    December, 2013  . Anemia   . CAD (coronary artery disease)    Mild nonobstructive plaque in cath 2013  . Chest pain    December, 2013  . Dizziness   . Gout   . Hemorrhoid   . Hyperlipidemia   . Hypertension   . Skin cancer   . Spinal stenosis      ALLERGIES:  is allergic to trazodone and nefazodone.  MEDICATIONS:  Current Outpatient Medications  Medication Sig Dispense Refill  . albuterol (PROVENTIL) (2.5 MG/3ML) 0.083% nebulizer solution Take 3 mLs (2.5 mg total) by nebulization every 6 (six) hours as needed for wheezing or shortness of breath. 75 mL 12  . ALPRAZolam (XANAX) 0.25 MG tablet Take 1 tablet (0.25 mg total) by mouth at bedtime as needed for anxiety. 15 tablet 0  . Apple Cider Vinegar 500 MG TABS Take by mouth.    . Artificial Tear Solution (SOOTHE XP OP) Place 1 drop into both eyes 2 (two) times daily.    . carvedilol (COREG) 12.5 MG tablet Take 1 tablet (12.5 mg total) by mouth 2 (two) times daily with a meal. 180 tablet 3  . Cyanocobalamin (VITAMIN B 12) 250 MCG LOZG Take by mouth.    . diphenhydramine-acetaminophen (TYLENOL PM) 25-500 MG TABS tablet Take 1-2 tablets by mouth at bedtime as needed (pain).    . ferrous sulfate 324 MG TBEC Take 324 mg by mouth daily with breakfast.    . levothyroxine (SYNTHROID) 150 MCG tablet Take 1 tablet (150 mcg total) by mouth daily before breakfast. 30 tablet 0  . lidocaine-prilocaine (EMLA) cream Apply 1 application topically as needed. 30 g 1  . mirtazapine (REMERON) 15 MG tablet TAKE 1 TABLET BY MOUTH EVERYDAY AT BEDTIME 90 tablet 1  . Multiple  Vitamins-Minerals (PRESERVISION AREDS 2 PO) Take by mouth.    . oxyCODONE-acetaminophen (PERCOCET/ROXICET) 5-325 MG tablet Take 1 tablet by mouth every 8 (eight) hours as needed for severe pain. 30 tablet 0  . prochlorperazine (COMPAZINE) 10 MG tablet Take 1 tablet (10 mg total) by mouth every 6 (six) hours as needed for nausea or vomiting. 30 tablet 1  . senna-docusate (SENNA S) 8.6-50 MG tablet 1 to 2 twice daily for constipation 120 tablet 5   No current facility-administered medications for this visit.    SURGICAL HISTORY:  Past Surgical History:  Procedure Laterality Date  . 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 BELPHAROPTOSIS  REPAIR Bilateral   . CATARACT EXTRACTION W/ INTRAOCULAR LENS  IMPLANT, BILATERAL    . COLONOSCOPY N/A 11/03/2014   Procedure: COLONOSCOPY;  Surgeon: Rogene Houston, MD;  Location: [facility];  Service: Endoscopy;  Laterality: N/A;  1225  . IR IMAGING GUIDED PORT INSERTION  07/11/2018  . KNEE CARTILAGE SURGERY Left    Left knee  . SKIN CANCER EXCISION  12/2014, 04/25/15  . VIDEO BRONCHOSCOPY Bilateral 05/09/2018   Procedure: VIDEO BRONCHOSCOPY WITH FLUORO;  Surgeon: Juanito Doom, MD;  Location: [facility];  Service: Cardiopulmonary;  Laterality: Bilateral;    REVIEW OF SYSTEMS:  Constitutional: negative Eyes: negative Ears, nose, mouth, throat, and face: negative Respiratory: negative Cardiovascular: negative Gastrointestinal: negative Genitourinary:negative Integument/breast: negative Hematologic/lymphatic: negative Musculoskeletal:positive for arthralgias and back pain Neurological: negative Behavioral/Psych: negative Endocrine: negative Allergic/Immunologic: negative   PHYSICAL EXAMINATION: General appearance: alert, cooperative and no distress Head: Normocephalic, without obvious abnormality, atraumatic Neck: no adenopathy, no JVD, supple, symmetrical, trachea midline and thyroid not enlarged, symmetric, no tenderness/mass/nodules Lymph nodes: Cervical, supraclavicular, and axillary nodes normal. Resp: clear to auscultation bilaterally Back: symmetric, no curvature. ROM normal. No CVA tenderness. Cardio: regular rate and rhythm, S1, S2 normal, no murmur, click, rub or gallop GI: soft, non-tender; bowel sounds normal; no masses,  no organomegaly Extremities: extremities normal, atraumatic, no cyanosis or edema Neurologic: Alert and oriented X 3, normal strength and tone. Normal symmetric reflexes. Normal coordination and gait  ECOG PERFORMANCE STATUS: 1 - Symptomatic but completely ambulatory  Blood pressure 122/83, pulse 72, temperature 97.9 F (36.6 C), temperature  source Temporal, resp. rate 18, height 6\' 2"  (1.88 m), weight 239 lb 3.2 oz (108.5 kg), SpO2 99 %.  LABORATORY DATA: Lab Results  Component Value Date   WBC 5.1 08/25/2019   HGB 12.5 (L) 08/25/2019   HCT 36.5 (L) 08/25/2019   MCV 91.5 08/25/2019   PLT 157 08/25/2019      Chemistry      Component Value Date/Time   NA 139 08/04/2019 1045   NA 144 04/09/2018 1507   K 4.5 08/04/2019 1045   CL 106 08/04/2019 1045   CO2 23 08/04/2019 1045   BUN 18 08/04/2019 1045   BUN 15 04/09/2018 1507   CREATININE 1.04 08/04/2019 1045      Component Value Date/Time   CALCIUM 9.1 08/04/2019 1045   ALKPHOS 55 08/04/2019 1045   AST 19 08/04/2019 1045   ALT 20 08/04/2019 1045   BILITOT 0.7 08/04/2019 1045       RADIOGRAPHIC STUDIES: CT Chest W Contrast  Result Date: 08/21/2019 CLINICAL DATA:  Restaging of non-small cell lung cancer EXAM: CT CHEST, ABDOMEN, AND PELVIS WITH CONTRAST TECHNIQUE: Multidetector CT imaging of the chest, abdomen and pelvis was performed following the standard protocol during bolus administration of intravenous contrast. CONTRAST:  12mL OMNIPAQUE IOHEXOL 300 MG/ML  SOLN COMPARISON:  Multiple exams, including 06/01/2019 FINDINGS: CT CHEST FINDINGS Cardiovascular: Right Port-A-Cath tip: Cavoatrial junction. Coronary, aortic arch, and branch vessel atherosclerotic vascular disease. Mediastinum/Nodes: Mild distal esophageal wall thickening, the most common cause would be esophagitis. Irregular but stable 2.2 by 1.2 cm soft tissue density in the left axilla with mildly spiculated margins, image 13/2. Adjacent atrophy of the left latissimus dorsi muscle. Lungs/Pleura: Right perihilar consolidation with volume loss along the inferior portion of the right upper lobe not appreciably changed from prior. Right lower paramediastinal interstitial accentuation and volume loss, likely therapy related. Multilobular left upper lobe mass measuring about 4.1 by 3.2 cm on image 89/6, previously  4.0 by 3.1 cm when measured in the same fashion on the prior exam. Musculoskeletal: Lower thoracic spondylosis. CT ABDOMEN PELVIS FINDINGS Hepatobiliary: Stable left hepatic lobe cyst on image 60/2. Gallbladder unremarkable. No significant hepatic lesion identified. Pancreas: Unremarkable Spleen: Unremarkable Adrenals/Urinary Tract: Nonobstructive bilateral renal calculi. Hypodense 1.1 by 0.9 cm left kidney upper pole lesion is likely a cyst although technically too small to characterize. The prostate gland mildly indents the urinary bladder base. Stomach/Bowel: Descending and sigmoid colon diverticulosis. Vascular/Lymphatic: Aortoiliac atherosclerotic vascular disease. No pathologic adenopathy identified. Reproductive: Unremarkable Other: No supplemental non-categorized findings. Musculoskeletal: Degenerative disc disease at L5-S1. IMPRESSION: 1. Essentially stable appearance of the right perihilar consolidation with volume loss along the inferior portion of the right upper lobe. 2. Essentially stable size and contour of the left upper lobe mass. 3. Stable soft tissue density in the left axilla, with adjacent atrophy of the left latissimus dorsi muscle. 4. Other imaging findings of potential clinical significance: Coronary atherosclerosis. Mild distal esophageal wall thickening, the most common cause would be esophagitis. Nonobstructive bilateral nephrolithiasis. Descending and sigmoid colon diverticulosis. Mildly indents the urinary bladder base. Degenerative disc disease at L5-S1. Aortic Atherosclerosis (ICD10-I70.0). Electronically Signed   By: Van Clines M.D.   On: 08/21/2019 14:29   CT Abdomen Pelvis W Contrast  Result Date: 08/21/2019 CLINICAL DATA:  Restaging of non-small cell lung cancer EXAM: CT CHEST, ABDOMEN, AND PELVIS WITH CONTRAST TECHNIQUE: Multidetector CT imaging of the chest, abdomen and pelvis was performed following the standard protocol during bolus administration of intravenous  contrast. CONTRAST:  176mL OMNIPAQUE IOHEXOL 300 MG/ML  SOLN COMPARISON:  Multiple exams, including 06/01/2019 FINDINGS: CT CHEST FINDINGS Cardiovascular: Right Port-A-Cath tip: Cavoatrial junction. Coronary, aortic arch, and branch vessel atherosclerotic vascular disease. Mediastinum/Nodes: Mild distal esophageal wall thickening, the most common cause would be esophagitis. Irregular but stable 2.2 by 1.2 cm soft tissue density in the left axilla with mildly spiculated margins, image 13/2. Adjacent atrophy of the left latissimus dorsi muscle. Lungs/Pleura: Right perihilar consolidation with volume loss along the inferior portion of the right upper lobe not appreciably changed from prior. Right lower paramediastinal interstitial accentuation and volume loss, likely therapy related. Multilobular left upper lobe mass measuring about 4.1 by 3.2 cm on image 89/6, previously 4.0 by 3.1 cm when measured in the same fashion on the prior exam. Musculoskeletal: Lower thoracic spondylosis. CT ABDOMEN PELVIS FINDINGS Hepatobiliary: Stable left hepatic lobe cyst on image 60/2. Gallbladder unremarkable. No significant hepatic lesion identified. Pancreas: Unremarkable Spleen: Unremarkable Adrenals/Urinary Tract: Nonobstructive bilateral renal calculi. Hypodense 1.1 by 0.9 cm left kidney upper pole lesion is likely a cyst although technically too small to characterize. The prostate gland mildly indents the urinary bladder base. Stomach/Bowel: Descending and sigmoid colon diverticulosis. Vascular/Lymphatic: Aortoiliac atherosclerotic vascular disease. No pathologic adenopathy  identified. Reproductive: Unremarkable Other: No supplemental non-categorized findings. Musculoskeletal: Degenerative disc disease at L5-S1. IMPRESSION: 1. Essentially stable appearance of the right perihilar consolidation with volume loss along the inferior portion of the right upper lobe. 2. Essentially stable size and contour of the left upper lobe mass. 3.  Stable soft tissue density in the left axilla, with adjacent atrophy of the left latissimus dorsi muscle. 4. Other imaging findings of potential clinical significance: Coronary atherosclerosis. Mild distal esophageal wall thickening, the most common cause would be esophagitis. Nonobstructive bilateral nephrolithiasis. Descending and sigmoid colon diverticulosis. Mildly indents the urinary bladder base. Degenerative disc disease at L5-S1. Aortic Atherosclerosis (ICD10-I70.0). Electronically Signed   By: Van Clines M.D.   On: 08/21/2019 14:29    ASSESSMENT AND PLAN: This is a very pleasant 73 years old white male with very light most smoking history recently diagnosed with stage IV (T3, N0, M1c) non-small cell lung cancer, squamous cell carcinoma based on the biopsy from the left axillary mass. He status post 4 cycles of induction systemic chemotherapy with carboplatin, paclitaxel and Keytruda with partial response.  The patient is currently on maintenance treatment with single agent Keytruda status post 19 cycles. The patient has been tolerating this treatment well with no concerning adverse effects. He had repeat CT scan of the chest, abdomen pelvis performed recently.  I personally and independently reviewed the scans and discussed the results with the patient today. His scan showed no concerning findings for disease progression. I recommended for the patient to continue his current treatment with [facility] and he will proceed with cycle #20 of his maintenance therapy today. For the hypothyroidism, he is currently on levothyroxine 150 mcg p.o. daily.  We will continue to monitor his TSH closely and adjust his dose as needed. The patient will come back for follow-up visit in 3 weeks for evaluation before the next cycle of his treatment. He was advised to call immediately if he has any concerning symptoms in the interval. The patient voices understanding of current disease status and treatment  options and is in agreement with the current care plan. All questions were answered. The patient knows to call the clinic with any problems, questions or concerns. We can certainly see the patient much sooner if necessary.  Disclaimer: This note was dictated with voice recognition software. Similar sounding words can inadvertently be transcribed and may not be corrected upon review.

## 2019-08-29 ENCOUNTER — Other Ambulatory Visit: Payer: Self-pay | Admitting: Internal Medicine

## 2019-08-29 DIAGNOSIS — C3491 Malignant neoplasm of unspecified part of right bronchus or lung: Secondary | ICD-10-CM

## 2019-09-03 IMAGING — DX DG THORACIC SPINE 2V
3 series · 3 of 3 positions shown · non-contrast
Comparison: CT scan of September 18, 1998.

CLINICAL DATA: Thoracic back pain without known injury.

EXAM:
THORACIC SPINE 2 VIEWS

[t-spine ap]
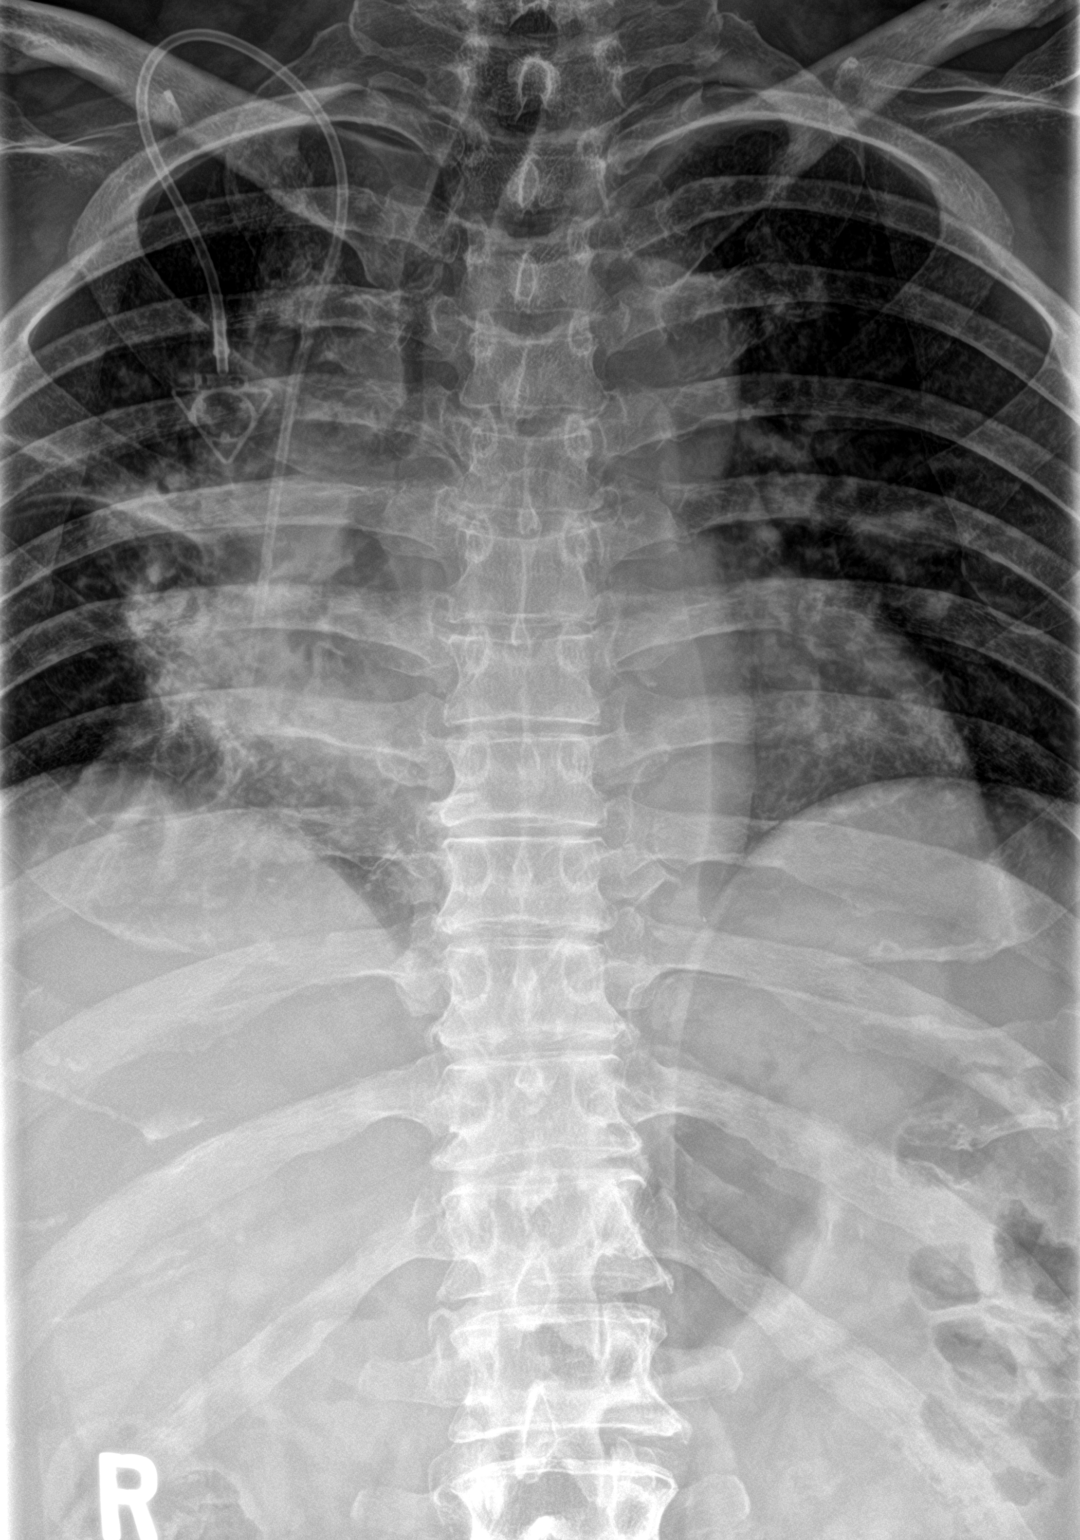

[t-spine lat]
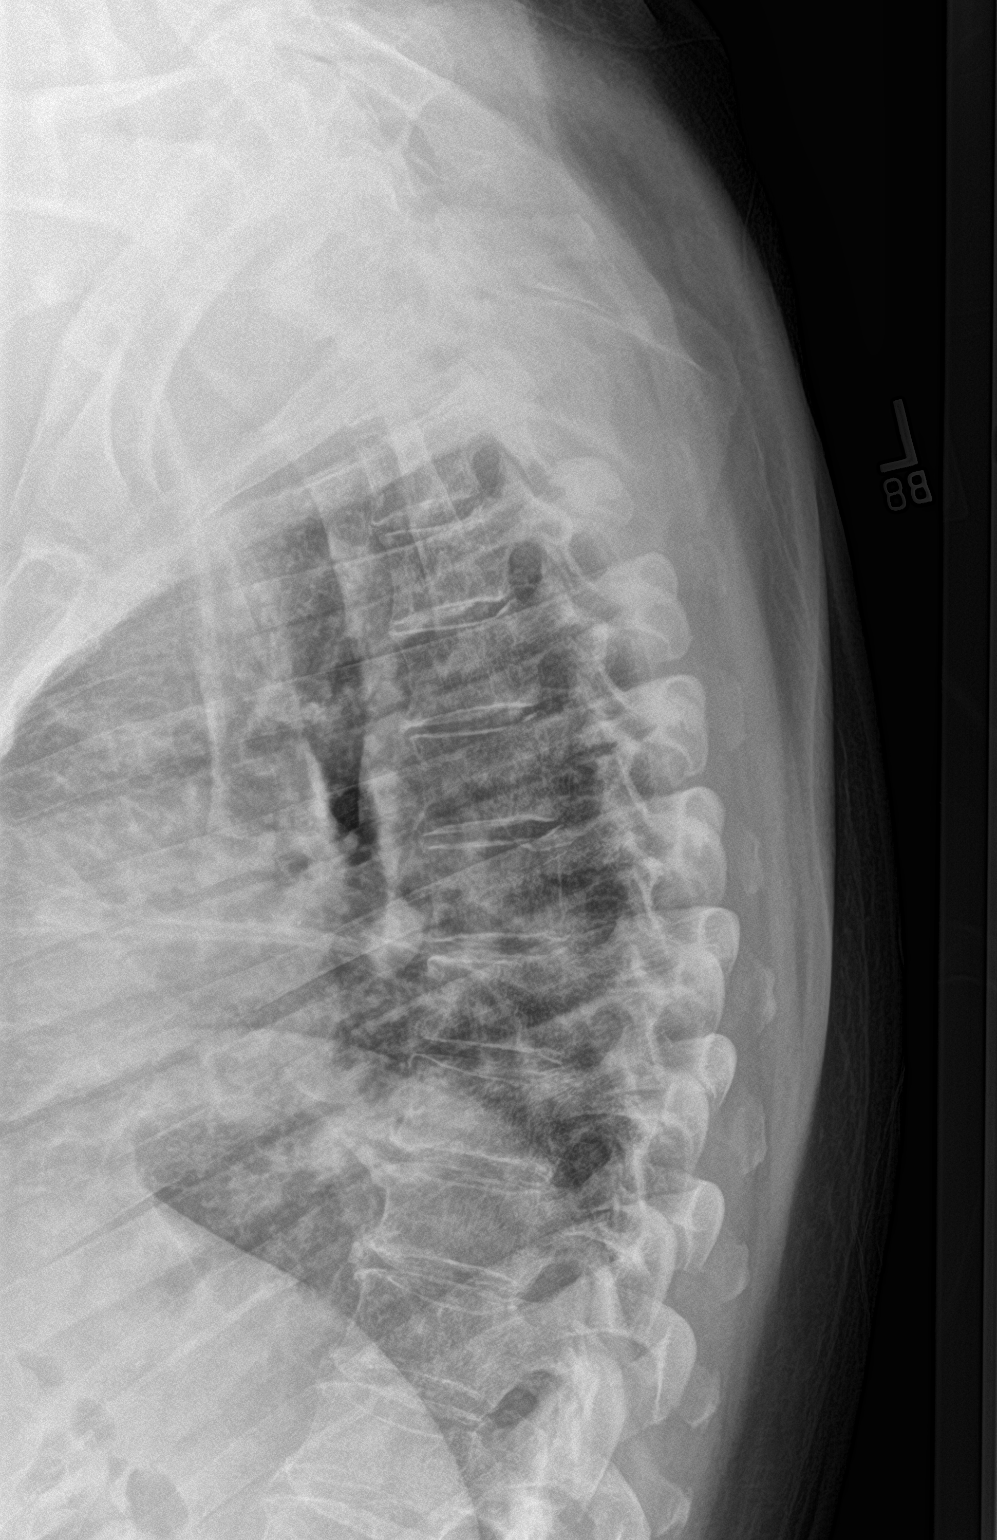

[t-spine swimmers]
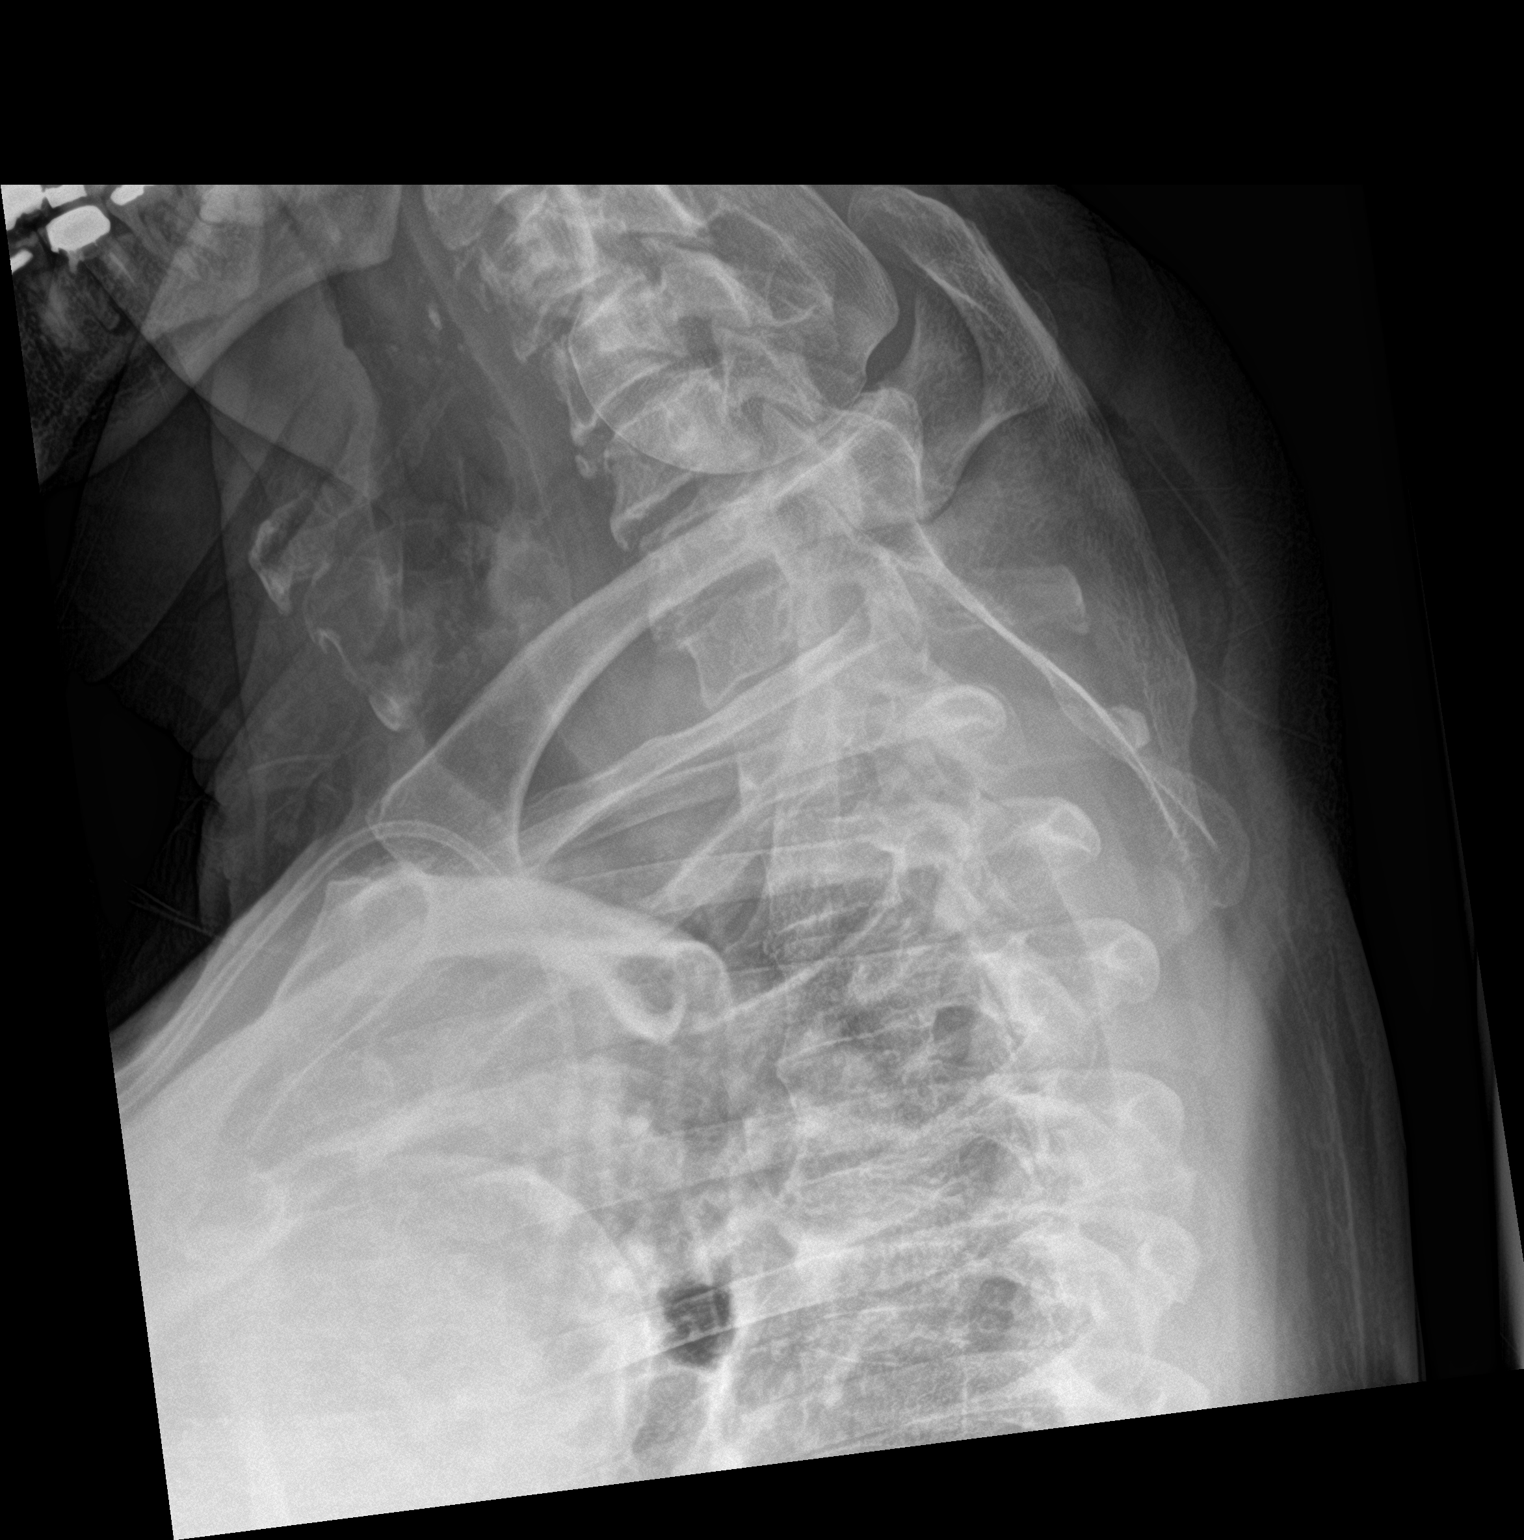

[3 of 3 positions shown; findings below may reference images not displayed]

FINDINGS: There is no evidence of thoracic spine fracture. Alignment is
normal. No other significant bone abnormalities are identified.
IMPRESSION: Negative.

## 2019-09-04 ENCOUNTER — Other Ambulatory Visit: Payer: Self-pay | Admitting: Internal Medicine

## 2019-09-04 DIAGNOSIS — G4700 Insomnia, unspecified: Secondary | ICD-10-CM

## 2019-09-04 DIAGNOSIS — R630 Anorexia: Secondary | ICD-10-CM

## 2019-09-04 DIAGNOSIS — C3491 Malignant neoplasm of unspecified part of right bronchus or lung: Secondary | ICD-10-CM

## 2019-09-08 ENCOUNTER — Other Ambulatory Visit: Payer: Self-pay

## 2019-09-08 ENCOUNTER — Ambulatory Visit: Payer: Medicare Other | Admitting: Physical Therapy

## 2019-09-09 ENCOUNTER — Other Ambulatory Visit: Payer: Self-pay

## 2019-09-09 ENCOUNTER — Ambulatory Visit: Payer: Medicare Other | Attending: Orthopedic Surgery | Admitting: Physical Therapy

## 2019-09-09 DIAGNOSIS — M545 Low back pain, unspecified: Secondary | ICD-10-CM

## 2019-09-09 DIAGNOSIS — G8929 Other chronic pain: Secondary | ICD-10-CM | POA: Diagnosis present

## 2019-09-09 DIAGNOSIS — M6281 Muscle weakness (generalized): Secondary | ICD-10-CM | POA: Insufficient documentation

## 2019-09-09 DIAGNOSIS — M25511 Pain in right shoulder: Secondary | ICD-10-CM | POA: Diagnosis present

## 2019-09-09 NOTE — Therapy (Signed)
[facility] [place], [place], [address] Phone: [phone]   Fax:  [phone]  Physical Therapy Evaluation  Patient Details  Name: Travis Padilla MRN: [id number] Date of Birth: Nov 20, 1945 Referring Provider (PT): [name] Plants MD.   Encounter Date: 09/09/2019  PT End of Session - 09/09/19 1450    Visit Number  1    Number of Visits  12    Date for PT Re-Evaluation  10/21/19    Authorization Type  FOTO.    PT Start Time  0100    PT Stop Time  0201    PT Time Calculation (min)  61 min       Past Medical History:  Diagnosis Date  . Abnormal nuclear stress test    December, 2013  . Anemia   . CAD (coronary artery disease)    Mild nonobstructive plaque in cath 2013  . Chest pain    December, 2013  . Dizziness   . Gout   . Hemorrhoid   . Hyperlipidemia   . Hypertension   . Skin cancer   . Spinal stenosis     Past Surgical History:  Procedure Laterality Date  . 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 BELPHAROPTOSIS REPAIR Bilateral   . CATARACT EXTRACTION W/ INTRAOCULAR LENS  IMPLANT, BILATERAL    . COLONOSCOPY N/A 11/03/2014   Procedure: COLONOSCOPY;  Surgeon: Rogene Houston, MD;  [facility];  Service: Endoscopy;  Laterality: N/A;  1225  . IR IMAGING GUIDED PORT INSERTION  07/11/2018  . KNEE CARTILAGE SURGERY Left    Left knee  . SKIN CANCER EXCISION  12/2014, 04/25/15  . VIDEO BRONCHOSCOPY Bilateral 05/09/2018   Procedure: VIDEO BRONCHOSCOPY WITH FLUORO;  Surgeon: Juanito Doom, MD;  [facility];  Service: Cardiopulmonary;  Laterality: Bilateral;    There were no vitals filed for this visit.   Subjective Assessment - 09/09/19 1451    Subjective  COVID-19 screen performed prior to patient entering clinic.  The patient return to PT today with continued c/o left sided low back pain and right shoulder pain.  His low back pain is a 4/10 today but commonly rises to a 7-7+/10.  His  right shoulder pain is low at rest today but increases significantly with certain movements.    Pertinent History  Chemotherapy due to lung cancer, hypothyroidism, left knee surgery, h/o neck pain, CAD.    Patient Stated Goals  Reduce pain.    Currently in Pain?  Yes    Pain Score  4     Pain Location  Back    Pain Orientation  Lower;Left    Pain Descriptors / Indicators  Aching    Pain Type  Chronic pain    Multiple Pain Sites  Yes    Pain Score  3    Pain Location  Shoulder    Pain Orientation  Right    Pain Type  Chronic pain    Pain Onset  More than a month ago    Pain Frequency  Intermittent         [facility] PT Assessment - 09/09/19 0001      Assessment   Medical Diagnosis  Right shoulder and low back pain.    Referring Provider (PT)  [name] Plants MD.    Onset Date/Surgical Date  --   Ongoing.     Precautions   Precautions  --   Receiving chemotherapy for lung cancer.  Restrictions   Weight Bearing Restrictions  No      Balance Screen   Has the patient fallen in the past 6 months  No    Has the patient had a decrease in activity level because of a fear of falling?   Yes    Is the patient reluctant to leave their home because of a fear of falling?   No      Home Environment   Living Environment  Private residence      Prior Function   Level of Independence  Independent      Posture/Postural Control   Posture/Postural Control  Postural limitations    Postural Limitations  Rounded Shoulders;Forward head      Deep Tendon Reflexes   DTR Assessment Site  Patella;Achilles    Patella DTR  1+    Achilles DTR  0      ROM / Strength   AROM / PROM / Strength  AROM;Strength      AROM   Overall AROM Comments  Full active lumbar flexion and extension= 22 degrees.  Full right shoulder flexion and ER.  Behind back to L4.      Strength   Overall Strength Comments  Normal bilateral LE strength.  Right shoulder IR/ER= 4-/5.      Palpation   Palpation comment  Tener  to palpation over left SIJ and upper gluteal region.      Special Tests   Other special tests  (+) Impingement test of right shoulder.  (=) leg lengths.  Lumbar and sacral special test were negative.      Ambulation/Gait   Gait Comments  Essentially normal.                Objective measurements completed on examination: See above findings.      [facility] Adult PT Treatment/Exercise - 09/09/19 0001      Modalities   Modalities  Moist Heat;Traction      Moist Heat Therapy   Number Minutes Moist Heat  10 Minutes    Moist Heat Location  Lumbar Spine      Traction   Type of Traction  Lumbar    Min (lbs)  5    Max (lbs)  80    Hold Time  99    Rest Time  5    Time  15                  PT Long Term Goals - 09/09/19 1535      PT LONG TERM GOAL #1   Title  I with HEP    Time  4    Period  Weeks    Status  New      PT LONG TERM GOAL #2   Title  Patient able to reach behind back with right hand to L2.    Time  6    Period  Weeks    Status  New      PT LONG TERM GOAL #3   Title  Morning LBP level not > 3-4/10.             Plan - 09/09/19 1523    Clinical Impression Statement  The patient presents to OPPT with c/o left sided low back pain and right shoulder pain.  Typical morning LBP level is a 7/10.  His right shoulder pain can become quite severe with certain movements.  He demonstrated a positive right shoulder impingement test and he has some weakness  into IR and ER. He is tender to palpation over his left SIJ and upper gluteal region.  He reported no palpable right shoulder pain.  Patient will benefit from skilled physical therapy intervention to address deficits and pain.    Personal Factors and Comorbidities  Comorbidity 1;Comorbidity 2    Comorbidities  Chemotherapy due to lung cancer, hypothyroidism, left knee surgery, h/o neck pain, CAD.    Examination-Activity Limitations  Toileting    Examination-Participation Restrictions  Other     Stability/Clinical Decision Making  Evolving/Moderate complexity    Clinical Decision Making  Low    Rehab Potential  Good    PT Frequency  2x / week    PT Duration  6 weeks    PT Treatment/Interventions  ADLs/Self Care Home Management;Cryotherapy;Electrical Stimulation;Moist Heat;Therapeutic exercise;Therapeutic activities;Patient/family education;Manual techniques;Passive range of motion;Vasopneumatic Device;Spinal Manipulations;Joint Manipulations    PT Next Visit Plan  Can try intermittment traction at 90% per patient response, STW/M and e'stim to left SIJ/upper gluteal region.  Right shoulder RW4, [facility] ER and full can.    Consulted and Agree with Plan of Care  Patient       Patient will benefit from skilled therapeutic intervention in order to improve the following deficits and impairments:  Pain, Decreased strength, Decreased range of motion, Decreased activity tolerance  Visit Diagnosis: Acute left-sided low back pain without sciatica - Plan: PT plan of care cert/re-cert  Chronic right shoulder pain - Plan: PT plan of care cert/re-cert  Muscle weakness (generalized) - Plan: PT plan of care cert/re-cert     Problem List Patient Active Problem List   Diagnosis Date Noted  . Dyslipidemia 07/14/2019  . Shoulder pain 03/31/2019  . Port-A-Cath in place 08/11/2018  . Hypothyroidism (acquired) 08/11/2018  . Metastasis to lung ([place]) 05/19/2018  . Encounter for antineoplastic chemotherapy 05/13/2018  . Encounter for antineoplastic immunotherapy 05/13/2018  . Goals of care, counseling/discussion 05/13/2018  . 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 Axillary adenopathy 04/24/2018  . Right lower lobe lung mass 04/22/2018  . Cough with hemoptysis 04/22/2018  . Wheezing 04/02/2018  . Long-term use of high-risk medication 04/02/2018  . Dizziness 06/26/2017  . Fatigue 04/30/2016  . Insomnia 04/30/2016  . Hyperlipidemia 01/31/2016  . Ear pain 05/09/2015  .  Anemia   . Abnormal nuclear stress test   . OBESITY, UNSPECIFIED 03/28/2009  . ESSENTIAL HYPERTENSION, BENIGN 03/28/2009  . PALPITATIONS 03/28/2009  . SNORING 03/28/2009    Akane Tessier, [place] MPT 09/09/2019, 3:39 PM  [facility] [address] [place], [place], [address] Phone: [phone]   Fax:  [phone]  Name: Travis Padilla MRN: [id number] Date of Birth: 04-03-1946

## 2019-09-14 ENCOUNTER — Encounter: Payer: Self-pay | Admitting: Physical Therapy

## 2019-09-14 ENCOUNTER — Other Ambulatory Visit: Payer: Self-pay

## 2019-09-14 ENCOUNTER — Ambulatory Visit: Payer: Medicare Other | Attending: Orthopedic Surgery | Admitting: Physical Therapy

## 2019-09-14 ENCOUNTER — Other Ambulatory Visit: Payer: Self-pay | Admitting: *Deleted

## 2019-09-14 DIAGNOSIS — E039 Hypothyroidism, unspecified: Secondary | ICD-10-CM

## 2019-09-14 DIAGNOSIS — M6281 Muscle weakness (generalized): Secondary | ICD-10-CM | POA: Diagnosis not present

## 2019-09-14 DIAGNOSIS — M25511 Pain in right shoulder: Secondary | ICD-10-CM | POA: Insufficient documentation

## 2019-09-14 DIAGNOSIS — M545 Low back pain, unspecified: Secondary | ICD-10-CM

## 2019-09-14 DIAGNOSIS — G8929 Other chronic pain: Secondary | ICD-10-CM | POA: Diagnosis not present

## 2019-09-14 DIAGNOSIS — Z5112 Encounter for antineoplastic immunotherapy: Secondary | ICD-10-CM

## 2019-09-14 DIAGNOSIS — C7801 Secondary malignant neoplasm of right lung: Secondary | ICD-10-CM

## 2019-09-14 NOTE — Therapy (Signed)
[facility] [place], [place], [address] Phone: [phone]   Fax:  [phone]  Physical Therapy Treatment  Patient Details  Name: Travis Padilla MRN: [id number] Date of Birth: 11-26-1945 Referring Provider (PT): [name] Plants MD.   Encounter Date: 09/14/2019  PT End of Session - 09/14/19 1112    Visit Number  2    Number of Visits  12    Date for PT Re-Evaluation  10/21/19    Authorization Type  FOTO.    PT Start Time  1031    PT Stop Time  1120    PT Time Calculation (min)  49 min    Activity Tolerance  Patient tolerated treatment well    Behavior During Therapy  WFL for tasks assessed/performed       Past Medical History:  Diagnosis Date  . Abnormal nuclear stress test    December, 2013  . Anemia   . CAD (coronary artery disease)    Mild nonobstructive plaque in cath 2013  . Chest pain    December, 2013  . Dizziness   . Gout   . Hemorrhoid   . Hyperlipidemia   . Hypertension   . Skin cancer   . Spinal stenosis     Past Surgical History:  Procedure Laterality Date  . 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 BELPHAROPTOSIS REPAIR Bilateral   . CATARACT EXTRACTION W/ INTRAOCULAR LENS  IMPLANT, BILATERAL    . COLONOSCOPY N/A 11/03/2014   Procedure: COLONOSCOPY;  Surgeon: Rogene Houston, MD;  [facility];  Service: Endoscopy;  Laterality: N/A;  1225  . IR IMAGING GUIDED PORT INSERTION  07/11/2018  . KNEE CARTILAGE SURGERY Left    Left knee  . SKIN CANCER EXCISION  12/2014, 04/25/15  . VIDEO BRONCHOSCOPY Bilateral 05/09/2018   Procedure: VIDEO BRONCHOSCOPY WITH FLUORO;  Surgeon: Juanito Doom, MD;  [facility];  Service: Cardiopulmonary;  Laterality: Bilateral;    There were no vitals filed for this visit.  Subjective Assessment - 09/14/19 1035    Subjective  COVID-19 screen performed prior to patient entering clinic. Patient arrived and reported that lumbar traction improved  symptoms.    Pertinent History  Chemotherapy due to lung cancer, hypothyroidism, left knee surgery, h/o neck pain, CAD.    Patient Stated Goals  Reduce pain.    Currently in Pain?  Yes    Pain Score  4     Pain Location  Back    Pain Orientation  Left;Lower    Pain Descriptors / Indicators  Aching    Pain Type  Chronic pain    Pain Onset  More than a month ago    Pain Frequency  Constant    Aggravating Factors   any increased activiyt    Pain Relieving Factors  at rest                       [facility] Adult PT Treatment/Exercise - 09/14/19 0001      Self-Care   Self-Care  ADL's;Lifting;Posture;Other Self-Care Comments    ADL's  brusing teeth,washing dishes, cleaning with ADL's and yard work    Lifting  all positions and power lift    Posture  sitting, standing    Other Self-Care Comments   log roll and transitional movements      Exercises   Exercises  Lumbar;Shoulder      Lumbar Exercises: Supine   Ab Set  5 reps    Glut Set  5 reps    Clam  20 reps;3 seconds   red t-band with core activation   Bent Knee Raise  3 seconds   2x10 with core actvation   Straight Leg Raise  3 seconds   2x10 with core actvation     Shoulder Exercises: Standing   External Rotation  Strengthening;Right;10 reps;Theraband    Theraband Level (Shoulder External Rotation)  Level 2 (Red)    Internal Rotation  Strengthening;Right;10 reps;Theraband    Theraband Level (Shoulder Internal Rotation)  Level 2 (Red)    Flexion  Strengthening;Right;10 reps;Theraband    Theraband Level (Shoulder Flexion)  Level 2 (Red)    Extension  Strengthening;Right;10 reps;Theraband    Theraband Level (Shoulder Extension)  Level 2 (Red)    Other Standing Exercises  scaption 3x10 no weight      Shoulder Exercises: ROM/Strengthening   Wall Pushups  5 reps   DC due to discomfort   Wall Pushups Limitations  unable due to discomfort      Traction   Type of Traction  Lumbar    Min (lbs)  5    Max (lbs)  80     Hold Time  99    Rest Time  5    Time  15                  PT Long Term Goals - 09/14/19 1125      PT LONG TERM GOAL #1   Title  I with HEP    Time  4    Period  Weeks    Status  On-going      PT LONG TERM GOAL #2   Title  Patient able to reach behind back with right hand to L2.    Time  6    Period  Weeks    Status  On-going      PT LONG TERM GOAL #3   Title  Morning LBP level not > 3-4/10.    Time  6    Period  Weeks    Status  On-going            Plan - 09/14/19 1125    Clinical Impression Statement  Patient tolerated treatment well today. Today focused on posture awareness techniques today for at home and yard work, lifting and bending to avoid increased discomfort. Patient reported doing daily back and shoulder exercises and using a band at home. Today reviewed RW4 exercises and technique and progressed patient with core activation exercises. Patient responded well after lumbar traction today. Patient prgressing with current goals.    Personal Factors and Comorbidities  Comorbidity 1;Comorbidity 2    Comorbidities  Chemotherapy due to lung cancer, hypothyroidism, left knee surgery, h/o neck pain, CAD.    Examination-Activity Limitations  Toileting    Examination-Participation Restrictions  Other    Stability/Clinical Decision Making  Evolving/Moderate complexity    Rehab Potential  Good    PT Frequency  2x / week    PT Duration  6 weeks    PT Treatment/Interventions  ADLs/Self Care Home Management;Cryotherapy;Electrical Stimulation;Moist Heat;Therapeutic exercise;Therapeutic activities;Patient/family education;Manual techniques;Passive range of motion;Vasopneumatic Device;Spinal Manipulations;Joint Manipulations    PT Next Visit Plan  assess and cont with POC may consider 85# next treatment per PT / STW/M and e'stim to left SIJ/upper gluteal region.  Progress shoulder and core strength as tolerated.    Consulted and Agree with Plan of Care  Patient  Patient will benefit from skilled therapeutic intervention in order to improve the following deficits and impairments:  Pain, Decreased strength, Decreased range of motion, Decreased activity tolerance  Visit Diagnosis: Acute left-sided low back pain without sciatica  Chronic right shoulder pain  Muscle weakness (generalized)     Problem List Patient Active Problem List   Diagnosis Date Noted  . Dyslipidemia 07/14/2019  . Shoulder pain 03/31/2019  . Port-A-Cath in place 08/11/2018  . Hypothyroidism (acquired) 08/11/2018  . Metastasis to lung ([place]) 05/19/2018  . Encounter for antineoplastic chemotherapy 05/13/2018  . Encounter for antineoplastic immunotherapy 05/13/2018  . Goals of care, counseling/discussion 05/13/2018  . 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 Axillary adenopathy 04/24/2018  . Right lower lobe lung mass 04/22/2018  . Cough with hemoptysis 04/22/2018  . Wheezing 04/02/2018  . Long-term use of high-risk medication 04/02/2018  . Dizziness 06/26/2017  . Fatigue 04/30/2016  . Insomnia 04/30/2016  . Hyperlipidemia 01/31/2016  . Ear pain 05/09/2015  . Anemia   . Abnormal nuclear stress test   . OBESITY, UNSPECIFIED 03/28/2009  . ESSENTIAL HYPERTENSION, BENIGN 03/28/2009  . PALPITATIONS 03/28/2009  . SNORING 03/28/2009    Tongela Encinas P, PTA 09/14/2019, 12:02 PM  [facility] [place], [place], [address] Phone: [phone]   Fax:  [phone]  Name: Travis Padilla MRN: [id number] Date of Birth: [birth year]

## 2019-09-15 ENCOUNTER — Inpatient Hospital Stay: Payer: Medicare Other | Attending: Oncology | Admitting: Physician Assistant

## 2019-09-15 ENCOUNTER — Inpatient Hospital Stay: Payer: Medicare Other

## 2019-09-15 ENCOUNTER — Other Ambulatory Visit: Payer: Self-pay

## 2019-09-15 VITALS — BP 107/64 | HR 70 | Temp 98.1°F | Resp 18 | Ht 74.0 in | Wt 243.8 lb

## 2019-09-15 DIAGNOSIS — I2510 Atherosclerotic heart disease of native coronary artery without angina pectoris: Secondary | ICD-10-CM | POA: Insufficient documentation

## 2019-09-15 DIAGNOSIS — E039 Hypothyroidism, unspecified: Secondary | ICD-10-CM | POA: Diagnosis not present

## 2019-09-15 DIAGNOSIS — Z85828 Personal history of other malignant neoplasm of skin: Secondary | ICD-10-CM | POA: Insufficient documentation

## 2019-09-15 DIAGNOSIS — C3491 Malignant neoplasm of unspecified part of right bronchus or lung: Secondary | ICD-10-CM

## 2019-09-15 DIAGNOSIS — M2550 Pain in unspecified joint: Secondary | ICD-10-CM | POA: Insufficient documentation

## 2019-09-15 DIAGNOSIS — Z79899 Other long term (current) drug therapy: Secondary | ICD-10-CM | POA: Insufficient documentation

## 2019-09-15 DIAGNOSIS — R911 Solitary pulmonary nodule: Secondary | ICD-10-CM | POA: Insufficient documentation

## 2019-09-15 DIAGNOSIS — Z95828 Presence of other vascular implants and grafts: Secondary | ICD-10-CM

## 2019-09-15 DIAGNOSIS — I10 Essential (primary) hypertension: Secondary | ICD-10-CM | POA: Insufficient documentation

## 2019-09-15 DIAGNOSIS — R197 Diarrhea, unspecified: Secondary | ICD-10-CM | POA: Insufficient documentation

## 2019-09-15 DIAGNOSIS — K5900 Constipation, unspecified: Secondary | ICD-10-CM | POA: Diagnosis not present

## 2019-09-15 DIAGNOSIS — Z5112 Encounter for antineoplastic immunotherapy: Secondary | ICD-10-CM | POA: Insufficient documentation

## 2019-09-15 DIAGNOSIS — E785 Hyperlipidemia, unspecified: Secondary | ICD-10-CM | POA: Insufficient documentation

## 2019-09-15 DIAGNOSIS — Z87891 Personal history of nicotine dependence: Secondary | ICD-10-CM | POA: Diagnosis not present

## 2019-09-15 DIAGNOSIS — Z923 Personal history of irradiation: Secondary | ICD-10-CM | POA: Insufficient documentation

## 2019-09-15 DIAGNOSIS — M5137 Other intervertebral disc degeneration, lumbosacral region: Secondary | ICD-10-CM | POA: Diagnosis not present

## 2019-09-15 DIAGNOSIS — C7801 Secondary malignant neoplasm of right lung: Secondary | ICD-10-CM

## 2019-09-15 LAB — CMP (CANCER CENTER ONLY)
ALT: 18 U/L (ref 0–44)
AST: 19 U/L (ref 15–41)
Albumin: 3.9 g/dL (ref 3.5–5.0)
Alkaline Phosphatase: 49 U/L (ref 38–126)
Anion gap: 8 (ref 5–15)
BUN: 12 mg/dL (ref 8–23)
CO2: 25 mmol/L (ref 22–32)
Calcium: 8.9 mg/dL (ref 8.9–10.3)
Chloride: 108 mmol/L (ref 98–111)
Creatinine: 0.84 mg/dL (ref 0.61–1.24)
GFR, Est AFR Am: >60 mL/min (ref >60)
GFR, Estimated: >60 mL/min (ref >60)
Glucose, Bld: 97 mg/dL (ref 70–99)
Potassium: 4 mmol/L (ref 3.5–5.1)
Sodium: 141 mmol/L (ref 135–145)
Total Bilirubin: 0.7 mg/dL (ref 0.3–1.2)
Total Protein: 7.2 g/dL (ref 6.5–8.1)

## 2019-09-15 LAB — CBC WITH DIFFERENTIAL (CANCER CENTER ONLY)
Abs Immature Granulocytes: 0.03 10*3/uL (ref 0.00–0.07)
Basophils Absolute: 0 10*3/uL (ref 0.0–0.1)
Basophils Relative: 1 %
Eosinophils Absolute: 0.3 10*3/uL (ref 0.0–0.5)
Eosinophils Relative: 6 %
HCT: 35.2 % — ABNORMAL LOW (ref 39.0–52.0)
Hemoglobin: 12.1 g/dL — ABNORMAL LOW (ref 13.0–17.0)
Immature Granulocytes: 1 %
Lymphocytes Relative: 21 %
Lymphs Abs: 1.1 10*3/uL (ref 0.7–4.0)
MCH: 31 pg (ref 26.0–34.0)
MCHC: 34.4 g/dL (ref 30.0–36.0)
MCV: 90.3 fL (ref 80.0–100.0)
Monocytes Absolute: 0.8 10*3/uL (ref 0.1–1.0)
Monocytes Relative: 15 %
Neutro Abs: 2.9 10*3/uL (ref 1.7–7.7)
Neutrophils Relative %: 56 %
Platelet Count: 150 10*3/uL (ref 150–400)
RBC: 3.9 MIL/uL — ABNORMAL LOW (ref 4.22–5.81)
RDW: 12 % (ref 11.5–15.5)
WBC Count: 5.1 10*3/uL (ref 4.0–10.5)
nRBC: 0 % (ref 0.0–0.2)

## 2019-09-15 LAB — TSH: TSH: 2.334 u[IU]/mL (ref 0.320–4.118)

## 2019-09-15 MED ORDER — HEPARIN SOD (PORK) LOCK FLUSH 100 UNIT/ML IV SOLN
500.0000 [IU] | Freq: Once | INTRAVENOUS | Status: AC | PRN
  Administered 2019-09-15: 500 [IU]
  Filled 2019-09-15: qty 5

## 2019-09-15 MED ORDER — SODIUM CHLORIDE 0.9% FLUSH
10.0000 mL | INTRAVENOUS | Status: DC | PRN
  Administered 2019-09-15: 10 mL
  Filled 2019-09-15: qty 10

## 2019-09-15 MED ORDER — SODIUM CHLORIDE 0.9 % IV SOLN
200.0000 mg | Freq: Once | INTRAVENOUS | Status: AC
  Administered 2019-09-15: 200 mg via INTRAVENOUS
  Filled 2019-09-15: qty 8

## 2019-09-15 MED ORDER — SODIUM CHLORIDE 0.9% FLUSH
10.0000 mL | Freq: Once | INTRAVENOUS | Status: AC
  Administered 2019-09-15: 10 mL
  Filled 2019-09-15: qty 10

## 2019-09-15 MED ORDER — SODIUM CHLORIDE 0.9 % IV SOLN
Freq: Once | INTRAVENOUS | Status: AC
  Filled 2019-09-15: qty 250

## 2019-09-15 NOTE — Patient Instructions (Signed)
[facility] Discharge Instructions for Patients Receiving Chemotherapy  Today you received the following Immunotherapy agent: Pembrolizumab Beryle Flock)  To help prevent nausea and vomiting after your treatment, we encourage you to take your nausea medication as directed by your MD.   If you develop nausea and vomiting that is not controlled by your nausea medication, call the clinic.   BELOW ARE SYMPTOMS THAT SHOULD BE REPORTED IMMEDIATELY:  *FEVER GREATER THAN 100.5 F  *CHILLS WITH OR WITHOUT FEVER  NAUSEA AND VOMITING THAT IS NOT CONTROLLED WITH YOUR NAUSEA MEDICATION  *UNUSUAL SHORTNESS OF BREATH  *UNUSUAL BRUISING OR BLEEDING  TENDERNESS IN MOUTH AND THROAT WITH OR WITHOUT PRESENCE OF ULCERS  *URINARY PROBLEMS  *BOWEL PROBLEMS  UNUSUAL RASH Items with * indicate a potential emergency and should be followed up as soon as possible.  Feel free to call the clinic should you have any questions or concerns. The clinic phone number is [phone].  Please show the [facility] at check-in to the Emergency Department and triage nurse.  Coronavirus (COVID-19) Are you at risk?  Are you at risk for the Coronavirus (COVID-19)?  To be considered HIGH RISK for Coronavirus (COVID-19), you have to meet the following criteria:  . Traveled to [place], [place], [place], [place] or [place]; or in the [place] to [place], [place], [place], or [place]; and have fever, cough, and shortness of breath within the last 2 weeks of travel OR . Been in close contact with a person diagnosed with COVID-19 within the last 2 weeks and have fever, cough, and shortness of breath . IF YOU DO NOT MEET THESE CRITERIA, YOU ARE CONSIDERED LOW RISK FOR COVID-19.  What to do if you are HIGH RISK for COVID-19?  Marland Kitchen If you are having a medical emergency, call 911. . Seek medical care right away. Before you go to a doctor's office, urgent care or emergency department, call ahead and  tell them about your recent travel, contact with someone diagnosed with COVID-19, and your symptoms. You should receive instructions from your physician's office regarding next steps of care.  . When you arrive at healthcare provider, tell the healthcare staff immediately you have returned from visiting [place], [place], [place], [place] or [place]; or traveled in the [place] to [place], [place], [place], or [place]; in the last two weeks or you have been in close contact with a person diagnosed with COVID-19 in the last 2 weeks.   . Tell the health care staff about your symptoms: fever, cough and shortness of breath. . After you have been seen by a medical provider, you will be either: o Tested for (COVID-19) and discharged home on quarantine except to seek medical care if symptoms worsen, and asked to  - Stay home and avoid contact with others until you get your results (4-5 days)  - Avoid travel on public transportation if possible (such as bus, train, or airplane) or o Sent to the Emergency Department by EMS for evaluation, COVID-19 testing, and possible admission depending on your condition and test results.  What to do if you are LOW RISK for COVID-19?  Reduce your risk of any infection by using the same precautions used for avoiding the common cold or flu:  Marland Kitchen Wash your hands often with soap and warm water for at least 20 seconds.  If soap and water are not readily available, use an alcohol-based hand sanitizer with at least 60% alcohol.  Marland Kitchen  If coughing or sneezing, cover your mouth and nose by coughing or sneezing into the elbow areas of your shirt or coat, into a tissue or into your sleeve (not your hands). . Avoid shaking hands with others and consider head nods or verbal greetings only. . Avoid touching your eyes, nose, or mouth with unwashed hands.  . Avoid close contact with people who are sick. . Avoid places or events with large numbers of people in one location, like  concerts or sporting events. . Carefully consider travel plans you have or are making. . If you are planning any travel outside or inside the [place], visit the CDC's Travelers' Health webpage for the latest health notices. . If you have some symptoms but not all symptoms, continue to monitor at home and seek medical attention if your symptoms worsen. . If you are having a medical emergency, call 911.   [place] / e-Visit: [email or web address]         [facility]: [facility]: [phone]                   [facility]: [phone]

## 2019-09-15 NOTE — Progress Notes (Signed)
[facility] OFFICE PROGRESS NOTE  [name], [place] [address]  DIAGNOSIS: Stage IV (T3, N0, M1c) non-small cell lung cancer, squamous cell carcinoma presented with large right infrahilar mass in addition to left upper lobe lung nodule as well as left axillary mass with left axillary lymph node diagnosed in August 2019.  PRIOR THERAPY: 1) Palliative radiotherapy to the right infrahilar mass as well as the axillary mass under the care of Dr. Lisbeth Renshaw. 2) Systemic chemotherapy with carboplatin for AUC of 5, paclitaxel 175 mg/M2 and Keytruda 200 mg IV every 3 weeks status post 4 cycles.  CURRENT THERAPY: Maintenance immunotherapy with single agent Keytruda 200 mg IV every 3 weeks status post 20cycles.  INTERVAL HISTORY: Travis Padilla 74 y.o. male returns to the clinic for a follow up visit. The patient is feeling well today without any concerning complaints except for arthralgias. He is currently undergoing physical therapy for degenerative disk disease and a bulging disc. The patient continues to tolerate treatment with maintenance Keytruda well without any concerning adverse side effects. Denies any fever, chills, night sweats, or weight loss. Denies any chest pain, shortness of breath, cough, or hemoptysis. Denies any nausea or vomiting. He occasionally experiences intermittent constipation as well as diarrhea. For constipation, he takes prune juice and senna-docusate. For diarrhea, he states that typically it is 1 loose stool daily which does not require any medications to control the diarrhea and resolves on its own. Denies any headache or visual changes. Denies any rashes or skin changes but reports intermittent itching, particularly on his toes. The patient is here today for evaluation prior to starting cycle # 21 of maintenance single agent Keytruda.   MEDICAL HISTORY: Past Medical History:  Diagnosis Date  . Abnormal nuclear stress test    December,  2013  . Anemia   . CAD (coronary artery disease)    Mild nonobstructive plaque in cath 2013  . Chest pain    December, 2013  . Dizziness   . Gout   . Hemorrhoid   . Hyperlipidemia   . Hypertension   . Skin cancer   . Spinal stenosis     ALLERGIES:  is allergic to trazodone and nefazodone.  MEDICATIONS:  Current Outpatient Medications  Medication Sig Dispense Refill  . albuterol (PROVENTIL) (2.5 MG/3ML) 0.083% nebulizer solution Take 3 mLs (2.5 mg total) by nebulization every 6 (six) hours as needed for wheezing or shortness of breath. (Patient not taking: Reported on 08/25/2019) 75 mL 12  . ALPRAZolam (XANAX) 0.25 MG tablet Take 1 tablet (0.25 mg total) by mouth at bedtime as needed for anxiety. 15 tablet 0  . Apple Cider Vinegar 500 MG TABS Take by mouth.    . Artificial Tear Solution (SOOTHE XP OP) Place 1 drop into both eyes 2 (two) times daily.    . carvedilol (COREG) 12.5 MG tablet Take 1 tablet (12.5 mg total) by mouth 2 (two) times daily with a meal. 180 tablet 3  . Cyanocobalamin (VITAMIN B 12) 250 MCG LOZG Take by mouth.    . diphenhydramine-acetaminophen (TYLENOL PM) 25-500 MG TABS tablet Take 1-2 tablets by mouth at bedtime as needed (pain).    . ferrous sulfate 324 MG TBEC Take 324 mg by mouth daily with breakfast.    . levothyroxine (SYNTHROID) 150 MCG tablet TAKE 1 TABLET BY MOUTH DAILY BEFORE BREAKFAST. 30 tablet 0  . lidocaine-prilocaine (EMLA) cream Apply 1 application topically as needed. 30 g 1  .  methocarbamol (ROBAXIN) 500 MG tablet Take 500 mg by mouth daily.    . mirtazapine (REMERON) 15 MG tablet TAKE 1 TABLET BY MOUTH EVERYDAY AT BEDTIME 90 tablet 1  . Multiple Vitamins-Minerals (PRESERVISION AREDS 2 PO) Take by mouth.    . oxyCODONE-acetaminophen (PERCOCET/ROXICET) 5-325 MG tablet Take 1 tablet by mouth every 8 (eight) hours as needed for severe pain. 30 tablet 0  . prochlorperazine (COMPAZINE) 10 MG tablet Take 1 tablet (10 mg total) by mouth every 6 (six)  hours as needed for nausea or vomiting. (Patient not taking: Reported on 08/25/2019) 30 tablet 1  . senna-docusate (SENNA S) 8.6-50 MG tablet 1 to 2 twice daily for constipation 120 tablet 5   No current facility-administered medications for this visit.   Facility-Administered Medications Ordered in Other Visits  Medication Dose Route Frequency Provider Last Rate Last Admin  . heparin lock flush 100 unit/mL  500 Units Intracatheter Once PRN Curt Bears, MD      . pembrolizumab [facility]) 200 mg in sodium chloride 0.9 % 50 mL chemo infusion  200 mg Intravenous Once Curt Bears, MD      . sodium chloride flush (NS) 0.9 % injection 10 mL  10 mL Intracatheter PRN Curt Bears, MD        SURGICAL HISTORY:  Past Surgical History:  Procedure Laterality Date  . 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 BELPHAROPTOSIS REPAIR Bilateral   . CATARACT EXTRACTION W/ INTRAOCULAR LENS  IMPLANT, BILATERAL    . COLONOSCOPY N/A 11/03/2014   Procedure: COLONOSCOPY;  Surgeon: Rogene Houston, MD;  [facility];  Service: Endoscopy;  Laterality: N/A;  1225  . IR IMAGING GUIDED PORT INSERTION  07/11/2018  . KNEE CARTILAGE SURGERY Left    Left knee  . SKIN CANCER EXCISION  12/2014, 04/25/15  . VIDEO BRONCHOSCOPY Bilateral 05/09/2018   Procedure: VIDEO BRONCHOSCOPY WITH FLUORO;  Surgeon: Juanito Doom, MD;  Location: [facility];  Service: Cardiopulmonary;  Laterality: Bilateral;    REVIEW OF SYSTEMS:   Review of Systems  Constitutional: Negative for appetite change, chills, fatigue, fever and unexpected weight change.  HENT:   Negative for mouth sores, nosebleeds, sore throat and trouble swallowing.   Eyes: Negative for eye problems and icterus.  Respiratory: Negative for cough, hemoptysis, shortness of breath and wheezing.  Cardiovascular: Negative for chest pain and leg swelling.  Gastrointestinal: Positive for occasional constipation and diarrhea. Negative for abdominal pain,  nausea and vomiting.  Genitourinary: Negative for bladder incontinence, difficulty urinating, dysuria, frequency and hematuria.   Musculoskeletal: Positive for back pain. Negative for gait problem and neck stiffness.  Skin: Positive for occasional itching. Negative for rash.  Neurological: Negative for dizziness, extremity weakness, gait problem, headaches, light-headedness and seizures.  Hematological: Negative for adenopathy. Does not bruise/bleed easily.  Psychiatric/Behavioral: Negative for confusion, depression and sleep disturbance. The patient is not nervous/anxious.     PHYSICAL EXAMINATION:  Blood pressure 107/64, pulse 70, temperature 98.1 F (36.7 C), temperature source Temporal, resp. rate 18, height 6\' 2"  (1.88 m), weight 243 lb 12.8 oz (110.6 kg), SpO2 98 %.  ECOG PERFORMANCE STATUS: 1 - Symptomatic but completely ambulatory  Physical Exam  Constitutional: Oriented to person, place, and time and well-developed, well-nourished, and in no distress. No distress.  HENT:  Head: Normocephalic and atraumatic.  Mouth/Throat: Oropharynx is clear and moist. No oropharyngeal exudate.  Eyes: Conjunctivae are normal. Right eye exhibits no discharge. Left eye exhibits no discharge. No scleral  icterus.  Neck: Normal range of motion. Neck supple.  Cardiovascular: Normal rate, irregular rhythm, normal heart sounds and intact distal pulses.   Pulmonary/Chest: Effort normal and breath sounds normal. No respiratory distress. No wheezes. No rales.  Abdominal: Soft. Bowel sounds are normal. Exhibits no distension and no mass. There is no tenderness.  Musculoskeletal: Normal range of motion. Exhibits no edema.  Lymphadenopathy:    No cervical adenopathy.  Neurological: Alert and oriented to person, place, and time. Exhibits normal muscle tone. Gait normal. Coordination normal.  Skin: Skin is warm and dry. No rash noted. Not diaphoretic. No erythema. No pallor.  Psychiatric: Mood, memory and  judgment normal.  Vitals reviewed.  LABORATORY DATA: Lab Results  Component Value Date   WBC 5.1 09/15/2019   HGB 12.1 (L) 09/15/2019   HCT 35.2 (L) 09/15/2019   MCV 90.3 09/15/2019   PLT 150 09/15/2019      Chemistry      Component Value Date/Time   NA 141 09/15/2019 0849   NA 144 04/09/2018 1507   K 4.0 09/15/2019 0849   CL 108 09/15/2019 0849   CO2 25 09/15/2019 0849   BUN 12 09/15/2019 0849   BUN 15 04/09/2018 1507   CREATININE 0.84 09/15/2019 0849      Component Value Date/Time   CALCIUM 8.9 09/15/2019 0849   ALKPHOS 49 09/15/2019 0849   AST 19 09/15/2019 0849   ALT 18 09/15/2019 0849   BILITOT 0.7 09/15/2019 0849       RADIOGRAPHIC STUDIES:  CT Chest W Contrast  Result Date: 08/21/2019 CLINICAL DATA:  Restaging of non-small cell lung cancer EXAM: CT CHEST, ABDOMEN, AND PELVIS WITH CONTRAST TECHNIQUE: Multidetector CT imaging of the chest, abdomen and pelvis was performed following the standard protocol during bolus administration of intravenous contrast. CONTRAST:  166mL OMNIPAQUE IOHEXOL 300 MG/ML  SOLN COMPARISON:  Multiple exams, including 06/01/2019 FINDINGS: CT CHEST FINDINGS Cardiovascular: Right Port-A-Cath tip: Cavoatrial junction. Coronary, aortic arch, and branch vessel atherosclerotic vascular disease. Mediastinum/Nodes: Mild distal esophageal wall thickening, the most common cause would be esophagitis. Irregular but stable 2.2 by 1.2 cm soft tissue density in the left axilla with mildly spiculated margins, image 13/2. Adjacent atrophy of the left latissimus dorsi muscle. Lungs/Pleura: Right perihilar consolidation with volume loss along the inferior portion of the right upper lobe not appreciably changed from prior. Right lower paramediastinal interstitial accentuation and volume loss, likely therapy related. Multilobular left upper lobe mass measuring about 4.1 by 3.2 cm on image 89/6, previously 4.0 by 3.1 cm when measured in the same fashion on the prior  exam. Musculoskeletal: Lower thoracic spondylosis. CT ABDOMEN PELVIS FINDINGS Hepatobiliary: Stable left hepatic lobe cyst on image 60/2. Gallbladder unremarkable. No significant hepatic lesion identified. Pancreas: Unremarkable Spleen: Unremarkable Adrenals/Urinary Tract: Nonobstructive bilateral renal calculi. Hypodense 1.1 by 0.9 cm left kidney upper pole lesion is likely a cyst although technically too small to characterize. The prostate gland mildly indents the urinary bladder base. Stomach/Bowel: Descending and sigmoid colon diverticulosis. Vascular/Lymphatic: Aortoiliac atherosclerotic vascular disease. No pathologic adenopathy identified. Reproductive: Unremarkable Other: No supplemental non-categorized findings. Musculoskeletal: Degenerative disc disease at L5-S1. IMPRESSION: 1. Essentially stable appearance of the right perihilar consolidation with volume loss along the inferior portion of the right upper lobe. 2. Essentially stable size and contour of the left upper lobe mass. 3. Stable soft tissue density in the left axilla, with adjacent atrophy of the left latissimus dorsi muscle. 4. Other imaging findings of potential clinical significance: Coronary atherosclerosis. Mild  distal esophageal wall thickening, the most common cause would be esophagitis. Nonobstructive bilateral nephrolithiasis. Descending and sigmoid colon diverticulosis. Mildly indents the urinary bladder base. Degenerative disc disease at L5-S1. Aortic Atherosclerosis (ICD10-I70.0). Electronically Signed   By: Van Clines M.D.   On: 08/21/2019 14:29   CT Abdomen Pelvis W Contrast  Result Date: 08/21/2019 CLINICAL DATA:  Restaging of non-small cell lung cancer EXAM: CT CHEST, ABDOMEN, AND PELVIS WITH CONTRAST TECHNIQUE: Multidetector CT imaging of the chest, abdomen and pelvis was performed following the standard protocol during bolus administration of intravenous contrast. CONTRAST:  135mL OMNIPAQUE IOHEXOL 300 MG/ML  SOLN  COMPARISON:  Multiple exams, including 06/01/2019 FINDINGS: CT CHEST FINDINGS Cardiovascular: Right Port-A-Cath tip: Cavoatrial junction. Coronary, aortic arch, and branch vessel atherosclerotic vascular disease. Mediastinum/Nodes: Mild distal esophageal wall thickening, the most common cause would be esophagitis. Irregular but stable 2.2 by 1.2 cm soft tissue density in the left axilla with mildly spiculated margins, image 13/2. Adjacent atrophy of the left latissimus dorsi muscle. Lungs/Pleura: Right perihilar consolidation with volume loss along the inferior portion of the right upper lobe not appreciably changed from prior. Right lower paramediastinal interstitial accentuation and volume loss, likely therapy related. Multilobular left upper lobe mass measuring about 4.1 by 3.2 cm on image 89/6, previously 4.0 by 3.1 cm when measured in the same fashion on the prior exam. Musculoskeletal: Lower thoracic spondylosis. CT ABDOMEN PELVIS FINDINGS Hepatobiliary: Stable left hepatic lobe cyst on image 60/2. Gallbladder unremarkable. No significant hepatic lesion identified. Pancreas: Unremarkable Spleen: Unremarkable Adrenals/Urinary Tract: Nonobstructive bilateral renal calculi. Hypodense 1.1 by 0.9 cm left kidney upper pole lesion is likely a cyst although technically too small to characterize. The prostate gland mildly indents the urinary bladder base. Stomach/Bowel: Descending and sigmoid colon diverticulosis. Vascular/Lymphatic: Aortoiliac atherosclerotic vascular disease. No pathologic adenopathy identified. Reproductive: Unremarkable Other: No supplemental non-categorized findings. Musculoskeletal: Degenerative disc disease at L5-S1. IMPRESSION: 1. Essentially stable appearance of the right perihilar consolidation with volume loss along the inferior portion of the right upper lobe. 2. Essentially stable size and contour of the left upper lobe mass. 3. Stable soft tissue density in the left axilla, with adjacent  atrophy of the left latissimus dorsi muscle. 4. Other imaging findings of potential clinical significance: Coronary atherosclerosis. Mild distal esophageal wall thickening, the most common cause would be esophagitis. Nonobstructive bilateral nephrolithiasis. Descending and sigmoid colon diverticulosis. Mildly indents the urinary bladder base. Degenerative disc disease at L5-S1. Aortic Atherosclerosis (ICD10-I70.0). Electronically Signed   By: Van Clines M.D.   On: 08/21/2019 14:29     ASSESSMENT/PLAN:  This is a very pleasant 74 year old Caucasian male with a very light smoking history. He was diagnosed with stage IV (T3, N0, M1C) non-small cell lung cancer, squamous cell carcinoma based on the biopsy of the left axillary mass. He was diagnosed in August 2019.   He is status post 4 cycles of induction systemic chemotherapy with carboplatin, paclitaxel, and Keytruda with a partial response. The patient is currently undergoing maintenance immunotherapy with Keytruda 200 mg IV every 3 weeks. He is status post20cycles of single agent Keytruda. He developed a skin rash after cycle #7 which resolved with a Medrol Dosepak and Benadryl.Otherwise, he is tolerating treatment well without any adverse effects.   Labs were reviewed with the patient. We recommend that he proceed with cycle #21 of single agent Keytruda today.   We will see him back for follow-up visit in 3 weeks for evaluation before starting cycle #22 of single agent Keytruda.  We will continue to monitor his TSH closely and make dose adjustments as necessary. His TSH is pending at this time.   The patient was advised to call immediately if he has any concerning symptoms in the interval. The patient voices understanding of current disease status and treatment options and is in agreement with the current care plan. All questions were answered. The patient knows to call the clinic with any problems, questions or concerns. We can  certainly see the patient much sooner if necessary   Orders Placed This Encounter  Procedures  . CBC with Differential (Cancer Center Only)    Standing Status:   Standing    Number of Occurrences:   18    Standing Expiration Date:   09/14/2020  . CMP ([facility] only)    Standing Status:   Standing    Number of Occurrences:   18    Standing Expiration Date:   09/14/2020  . TSH Every 3 Weeks    Standing Status:   Standing    Number of Occurrences:   18    Standing Expiration Date:   09/14/2020     Tarrence Enck L Korbyn Vanes, PA-C 09/15/19

## 2019-09-17 ENCOUNTER — Ambulatory Visit: Payer: Medicare Other | Admitting: Physical Therapy

## 2019-09-17 ENCOUNTER — Encounter: Payer: Self-pay | Admitting: Physical Therapy

## 2019-09-17 ENCOUNTER — Other Ambulatory Visit: Payer: Self-pay

## 2019-09-17 DIAGNOSIS — M6281 Muscle weakness (generalized): Secondary | ICD-10-CM

## 2019-09-17 DIAGNOSIS — M545 Low back pain, unspecified: Secondary | ICD-10-CM

## 2019-09-17 DIAGNOSIS — M25511 Pain in right shoulder: Secondary | ICD-10-CM

## 2019-09-17 DIAGNOSIS — G8929 Other chronic pain: Secondary | ICD-10-CM

## 2019-09-17 NOTE — Therapy (Cosign Needed)
[facility] [place], [place], [address] Phone: [phone]   Fax:  [phone]  Physical Therapy Treatment  Patient Details  Name: Travis Padilla MRN: [id number] Date of Birth: 12-08-1945 Referring Provider (PT): [name] Plants MD.   Encounter Date: 09/17/2019  PT End of Session - 09/17/19 1123    Visit Number  3    Number of Visits  12    Date for PT Re-Evaluation  10/21/19    Authorization Type  FOTO.    PT Start Time  1031    PT Stop Time  1116    PT Time Calculation (min)  45 min    Activity Tolerance  Patient tolerated treatment well    Behavior During Therapy  WFL for tasks assessed/performed       Past Medical History:  Diagnosis Date  . Abnormal nuclear stress test    December, 2013  . Anemia   . CAD (coronary artery disease)    Mild nonobstructive plaque in cath 2013  . Chest pain    December, 2013  . Dizziness   . Gout   . Hemorrhoid   . Hyperlipidemia   . Hypertension   . Skin cancer   . Spinal stenosis     Past Surgical History:  Procedure Laterality Date  . 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 BELPHAROPTOSIS REPAIR Bilateral   . CATARACT EXTRACTION W/ INTRAOCULAR LENS  IMPLANT, BILATERAL    . COLONOSCOPY N/A 11/03/2014   Procedure: COLONOSCOPY;  Surgeon: Rogene Houston, MD;  [facility];  Service: Endoscopy;  Laterality: N/A;  1225  . IR IMAGING GUIDED PORT INSERTION  07/11/2018  . KNEE CARTILAGE SURGERY Left    Left knee  . SKIN CANCER EXCISION  12/2014, 04/25/15  . VIDEO BRONCHOSCOPY Bilateral 05/09/2018   Procedure: VIDEO BRONCHOSCOPY WITH FLUORO;  Surgeon: Juanito Doom, MD;  Location: [facility];  Service: Cardiopulmonary;  Laterality: Bilateral;    There were no vitals filed for this visit.  Subjective Assessment - 09/17/19 1119    Subjective  COVID-19 screen performed prior to patient entering clinic. Patient arrived and reported that he was doing very well then  had his infusion for cancer treatment yesterday and today woke up with increased pain    Pertinent History  Chemotherapy due to lung cancer, hypothyroidism, left knee surgery, h/o neck pain, CAD.    Patient Stated Goals  Reduce pain.    Currently in Pain?  Yes    Pain Score  7     Pain Location  Back    Pain Orientation  Left;Lower    Pain Descriptors / Indicators  Aching;Sore    Pain Type  Chronic pain    Pain Onset  More than a month ago    Pain Frequency  Constant    Aggravating Factors   after infusion from his cancer treatment    Pain Relieving Factors  movement and at rest                       [facility] Adult PT Treatment/Exercise - 09/17/19 0001      Traction   Type of Traction  Lumbar    Min (lbs)  5    Max (lbs)  80    Hold Time  99    Rest Time  5    Time  15      Manual Therapy   Manual Therapy  Soft  tissue mobilization    Manual therapy comments  manual STW to left low back baraspinals, QL, SI area to reduce pain and tone, patient sidelying with pillow between knees for comfort                  PT Long Term Goals - 09/17/19 1124      PT LONG TERM GOAL #1   Title  I with HEP    Time  4    Period  Weeks    Status  On-going      PT LONG TERM GOAL #2   Title  Patient able to reach behind back with right hand to L2.    Time  6    Period  Weeks    Status  On-going      PT LONG TERM GOAL #3   Title  Morning LBP level not > 3-4/10.    Time  6    Period  Weeks    Status  On-going   up to 7/10 today 09/17/19           Plan - 09/17/19 1124    Clinical Impression Statement  Patient tolerated treatment well today although arrived with increased pain. Patient felt like he has responded well to therapy and pain level was decreasing yet after his infusion yesterday he woke up this morning with increased pain. Today focused on manual STW to left low back to decrease pain and tone followed by traction at the same weight today. Patient  current goals ongoing due to pain limitations. Educated patient on core activation with gentle progression with consistancy at this time and discussed possible progression next week if tolerated.    Personal Factors and Comorbidities  Comorbidity 1;Comorbidity 2    Comorbidities  Chemotherapy due to lung cancer, hypothyroidism, left knee surgery, h/o neck pain, CAD.    Examination-Activity Limitations  Toileting    Examination-Participation Restrictions  Other    Stability/Clinical Decision Making  Evolving/Moderate complexity    Rehab Potential  Good    PT Frequency  2x / week    PT Duration  6 weeks    PT Treatment/Interventions  ADLs/Self Care Home Management;Cryotherapy;Electrical Stimulation;Moist Heat;Therapeutic exercise;Therapeutic activities;Patient/family education;Manual techniques;Passive range of motion;Vasopneumatic Device;Spinal Manipulations;Joint Manipulations    PT Next Visit Plan  assess and cont with POC may consider 85# next treatment per PT / STW/M and e'stim to left SIJ/upper gluteal region.  Progress shoulder and core strength as tolerated.    Consulted and Agree with Plan of Care  Patient       Patient will benefit from skilled therapeutic intervention in order to improve the following deficits and impairments:  Pain, Decreased strength, Decreased range of motion, Decreased activity tolerance  Visit Diagnosis: Acute left-sided low back pain without sciatica  Chronic right shoulder pain  Muscle weakness (generalized)     Problem List Patient Active Problem List   Diagnosis Date Noted  . Dyslipidemia 07/14/2019  . Shoulder pain 03/31/2019  . Port-A-Cath in place 08/11/2018  . Hypothyroidism (acquired) 08/11/2018  . 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 Encounter for antineoplastic chemotherapy 05/13/2018  . Encounter for antineoplastic immunotherapy 05/13/2018  . Goals of care, counseling/discussion 05/13/2018  . Stage IV squamous cell carcinoma of right  lung ([place]) 05/13/2018  . Axillary adenopathy 04/24/2018  . Right lower lobe lung mass 04/22/2018  . Cough with hemoptysis 04/22/2018  . Wheezing 04/02/2018  . Long-term use of high-risk medication 04/02/2018  . Dizziness 06/26/2017  .  Fatigue 04/30/2016  . Insomnia 04/30/2016  . Hyperlipidemia 01/31/2016  . Ear pain 05/09/2015  . Anemia   . Abnormal nuclear stress test   . OBESITY, UNSPECIFIED 03/28/2009  . ESSENTIAL HYPERTENSION, BENIGN 03/28/2009  . PALPITATIONS 03/28/2009  . SNORING 03/28/2009    Sammie Schermerhorn P, PTA 09/17/2019, 11:34 AM  [facility] [address] [place], [place], [address] Phone: [phone]   Fax:  [phone]  Name: Travis Padilla MRN: [id number] Date of Birth: 03/17/1946

## 2019-09-22 ENCOUNTER — Other Ambulatory Visit: Payer: Medicare Other

## 2019-09-22 ENCOUNTER — Ambulatory Visit: Payer: Medicare Other | Admitting: *Deleted

## 2019-09-22 ENCOUNTER — Other Ambulatory Visit: Payer: Self-pay

## 2019-09-22 DIAGNOSIS — M545 Low back pain, unspecified: Secondary | ICD-10-CM

## 2019-09-22 DIAGNOSIS — G8929 Other chronic pain: Secondary | ICD-10-CM

## 2019-09-22 DIAGNOSIS — M6281 Muscle weakness (generalized): Secondary | ICD-10-CM

## 2019-09-22 DIAGNOSIS — M25511 Pain in right shoulder: Secondary | ICD-10-CM | POA: Diagnosis not present

## 2019-09-22 DIAGNOSIS — E785 Hyperlipidemia, unspecified: Secondary | ICD-10-CM

## 2019-09-22 NOTE — Therapy (Signed)
[facility] [place], [place], [address] Phone: [phone]   Fax:  [phone]  Physical Therapy Treatment  Patient Details  Name: Travis Padilla MRN: [id number] Date of Birth: 08-28-1946 Referring Provider (PT): [name] Plants MD.   Encounter Date: 09/22/2019  PT End of Session - 09/22/19 1201    Visit Number  4    Number of Visits  12    Date for PT Re-Evaluation  10/21/19    Authorization Type  FOTO.    PT Start Time  1115    PT Stop Time  1205    PT Time Calculation (min)  50 min       Past Medical History:  Diagnosis Date  . Abnormal nuclear stress test    December, 2013  . Anemia   . CAD (coronary artery disease)    Mild nonobstructive plaque in cath 2013  . Chest pain    December, 2013  . Dizziness   . Gout   . Hemorrhoid   . Hyperlipidemia   . Hypertension   . Skin cancer   . Spinal stenosis     Past Surgical History:  Procedure Laterality Date  . 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 BELPHAROPTOSIS REPAIR Bilateral   . CATARACT EXTRACTION W/ INTRAOCULAR LENS  IMPLANT, BILATERAL    . COLONOSCOPY N/A 11/03/2014   Procedure: COLONOSCOPY;  Surgeon: Rogene Houston, MD;  [facility]: [facility];  Service: Endoscopy;  Laterality: N/A;  1225  . IR IMAGING GUIDED PORT INSERTION  07/11/2018  . KNEE CARTILAGE SURGERY Left    Left knee  . SKIN CANCER EXCISION  12/2014, 04/25/15  . VIDEO BRONCHOSCOPY Bilateral 05/09/2018   Procedure: VIDEO BRONCHOSCOPY WITH FLUORO;  Surgeon: Juanito Doom, MD;  Location: [facility];  Service: Cardiopulmonary;  Laterality: Bilateral;    There were no vitals filed for this visit.  Subjective Assessment - 09/22/19 1120    Subjective  COVID-19 screen performed prior to patient entering clinic.  LB did well after last RX. Shldr is probaly about the same. I have questions about my HEP    Pertinent History  Chemotherapy due to lung cancer, hypothyroidism, left knee surgery,  h/o neck pain, CAD.    Patient Stated Goals  Reduce pain.                       [facility] Adult PT Treatment/Exercise - 09/22/19 0001      Exercises   Exercises  Lumbar;Shoulder      Lumbar Exercises: Stretches   Single Knee to Chest Stretch  Right;Left;5 reps      Lumbar Exercises: Supine   Ab Set  5 reps    Bent Knee Raise  3 seconds   2x10 with core actvation   Bridge  10 reps    Straight Leg Raise  10 reps   2x10 with core actvation     Shoulder Exercises: Sidelying   External Rotation  Strengthening;Right;10 reps;Weights      Traction   Type of Traction  Lumbar    Min (lbs)  5    Max (lbs)  85    Hold Time  99    Rest Time  5    Time  15      Manual Therapy   Manual Therapy  --       Current HEP reviewed with standing toe toeches and DKTC put on hold at this time  PT Long Term Goals - 09/17/19 1124      PT LONG TERM GOAL #1   Title  I with HEP    Time  4    Period  Weeks    Status  On-going      PT LONG TERM GOAL #2   Title  Patient able to reach behind back with right hand to L2.    Time  6    Period  Weeks    Status  On-going      PT LONG TERM GOAL #3   Title  Morning LBP level not > 3-4/10.    Time  6    Period  Weeks    Status  On-going   up to 7/10 today 09/17/19           Plan - 09/22/19 1136    Clinical Impression Statement  Pt arrived today doing fairly well and reports that he is doing better with LB, but wanted to review what he should and shouldnt be doing at home for his back. Core exs were reviwed with added bridges. Pt reports performing standing toe touches at home as well as DKTC at home and this was discourged at this time due to pain. Sidelying ER with 2# wt was performed today and tolerated well. Traction was performed at 85#s today with some LB soreness after Rx.    Comorbidities  Chemotherapy due to lung cancer, hypothyroidism, left knee surgery, h/o neck pain, CAD.    Examination-Activity  Limitations  Toileting    Stability/Clinical Decision Making  Evolving/Moderate complexity    Rehab Potential  Good    PT Frequency  2x / week    PT Duration  6 weeks    PT Treatment/Interventions  Manual techniques    PT Next Visit Plan  assess and cont with POC may consider 85# next treatment per PT / STW/M and e'stim to left SIJ/upper gluteal region.  Progress shoulder and core strength as tolerated.    Consulted and Agree with Plan of Care  Patient       Patient will benefit from skilled therapeutic intervention in order to improve the following deficits and impairments:  Pain, Decreased strength, Decreased range of motion, Decreased activity tolerance  Visit Diagnosis: Acute left-sided low back pain without sciatica  Chronic right shoulder pain  Muscle weakness (generalized)     Problem List Patient Active Problem List   Diagnosis Date Noted  . Dyslipidemia 07/14/2019  . Shoulder pain 03/31/2019  . Port-A-Cath in place 08/11/2018  . Hypothyroidism (acquired) 08/11/2018  . Metastasis to lung ([place]) 05/19/2018  . Encounter for antineoplastic chemotherapy 05/13/2018  . Encounter for antineoplastic immunotherapy 05/13/2018  . Goals of care, counseling/discussion 05/13/2018  . 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 Axillary adenopathy 04/24/2018  . Right lower lobe lung mass 04/22/2018  . Cough with hemoptysis 04/22/2018  . Wheezing 04/02/2018  . Long-term use of high-risk medication 04/02/2018  . Dizziness 06/26/2017  . Fatigue 04/30/2016  . Insomnia 04/30/2016  . Hyperlipidemia 01/31/2016  . Ear pain 05/09/2015  . Anemia   . Abnormal nuclear stress test   . OBESITY, UNSPECIFIED 03/28/2009  . ESSENTIAL HYPERTENSION, BENIGN 03/28/2009  . PALPITATIONS 03/28/2009  . SNORING 03/28/2009    Tijuana Scheidegger,CHRIS, PTA 09/22/2019, 12:18 PM  [facility] [place], [place], [address] Phone:  [phone]   Fax:  [phone]  Name: Travis Padilla MRN: [id number] Date of Birth:  10/17/1945   

## 2019-09-23 ENCOUNTER — Other Ambulatory Visit: Payer: Self-pay | Admitting: Internal Medicine

## 2019-09-23 DIAGNOSIS — C3491 Malignant neoplasm of unspecified part of right bronchus or lung: Secondary | ICD-10-CM

## 2019-09-23 LAB — LIPID PANEL
Chol/HDL Ratio: 6.9 ratio — ABNORMAL HIGH (ref 0.0–5.0)
Cholesterol, Total: 215 mg/dL — ABNORMAL HIGH (ref 100–199)
HDL: 31 mg/dL — ABNORMAL LOW (ref >39)
LDL Chol Calc (NIH): 145 mg/dL — ABNORMAL HIGH (ref 0–99)
Triglycerides: 212 mg/dL — ABNORMAL HIGH (ref 0–149)
VLDL Cholesterol Cal: 39 mg/dL (ref 5–40)

## 2019-09-24 ENCOUNTER — Other Ambulatory Visit: Payer: Self-pay

## 2019-09-24 ENCOUNTER — Encounter: Payer: Self-pay | Admitting: Physical Therapy

## 2019-09-24 ENCOUNTER — Ambulatory Visit: Payer: Medicare Other | Admitting: Physical Therapy

## 2019-09-24 DIAGNOSIS — M6281 Muscle weakness (generalized): Secondary | ICD-10-CM | POA: Diagnosis not present

## 2019-09-24 DIAGNOSIS — M25511 Pain in right shoulder: Secondary | ICD-10-CM | POA: Diagnosis not present

## 2019-09-24 DIAGNOSIS — M545 Low back pain, unspecified: Secondary | ICD-10-CM

## 2019-09-24 DIAGNOSIS — G8929 Other chronic pain: Secondary | ICD-10-CM | POA: Diagnosis not present

## 2019-09-24 NOTE — Therapy (Signed)
[facility] [place], [place], [address] Phone: [phone]   Fax:  [phone]  Physical Therapy Treatment  Patient Details  Name: Travis Padilla MRN: [id number] Date of Birth: 11-01-1945 Referring Provider (PT): [name] Plants MD.   Encounter Date: 09/24/2019  PT End of Session - 09/24/19 1204    Visit Number  5    Number of Visits  12    Date for PT Re-Evaluation  10/21/19    Authorization Type  FOTO.    PT Start Time  1115    PT Stop Time  1210    PT Time Calculation (min)  55 min    Activity Tolerance  Patient tolerated treatment well    Behavior During Therapy  WFL for tasks assessed/performed       Past Medical History:  Diagnosis Date  . Abnormal nuclear stress test    December, 2013  . Anemia   . CAD (coronary artery disease)    Mild nonobstructive plaque in cath 2013  . Chest pain    December, 2013  . Dizziness   . Gout   . Hemorrhoid   . Hyperlipidemia   . Hypertension   . Skin cancer   . Spinal stenosis     Past Surgical History:  Procedure Laterality Date  . 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 BELPHAROPTOSIS REPAIR Bilateral   . CATARACT EXTRACTION W/ INTRAOCULAR LENS  IMPLANT, BILATERAL    . COLONOSCOPY N/A 11/03/2014   Procedure: COLONOSCOPY;  Surgeon: Rogene Houston, MD;  [facility];  Service: Endoscopy;  Laterality: N/A;  1225  . IR IMAGING GUIDED PORT INSERTION  07/11/2018  . KNEE CARTILAGE SURGERY Left    Left knee  . SKIN CANCER EXCISION  12/2014, 04/25/15  . VIDEO BRONCHOSCOPY Bilateral 05/09/2018   Procedure: VIDEO BRONCHOSCOPY WITH FLUORO;  Surgeon: Juanito Doom, MD;  [facility];  Service: Cardiopulmonary;  Laterality: Bilateral;    There were no vitals filed for this visit.  Subjective Assessment - 09/24/19 1127    Subjective  COVID-19 screen performed prior to patient entering clinic.  Pt reporting 3/10 LBP. Pt verbally reviewed his HEP and  reported doing his back exercises prior to therapy.    Pertinent History  Chemotherapy due to lung cancer, hypothyroidism, left knee surgery, h/o neck pain, CAD.    Patient Stated Goals  Reduce pain.    Currently in Pain?  Yes    Pain Score  3     Pain Location  Back    Pain Orientation  Lower;Left    Pain Descriptors / Indicators  Aching;Sore    Pain Type  Chronic pain    Pain Onset  More than a month ago    Multiple Pain Sites  Yes    Pain Score  7    Pain Location  Shoulder   no pain at rest, 7/10 with use   Pain Orientation  Right    Pain Type  Chronic pain    Pain Onset  More than a month ago    Aggravating Factors   movements, reaching    Pain Relieving Factors  resting                       [facility] Adult PT Treatment/Exercise - 09/24/19 0001      Lumbar Exercises: Supine   Bent Knee Raise  10 reps    Bridge  10 reps  Bridge Limitations  with pelvic tilt first    Straight Leg Raise  10 reps      Shoulder Exercises: Standing   Other Standing Exercises  Door stretch 3 way      Traction   Type of Traction  Lumbar    Min (lbs)  5    Max (lbs)  95    Hold Time  99    Rest Time  5    Time  15             PT Education - 09/24/19 1202    Education Details  Pt instructed in hip rotation in supine using pillows to support. Pt also instructed in prone scapular retraction and ER of R UE, Door stretches added to pt's HEP.    Person(s) Educated  Patient    Methods  Explanation;Demonstration    Comprehension  Returned demonstration;Verbalized understanding;Verbal cues required          PT Long Term Goals - 09/24/19 1215      PT LONG TERM GOAL #1   Title  I with HEP    Time  4    Period  Weeks    Status  On-going      PT LONG TERM GOAL #2   Title  Patient able to reach behind back with right hand to L2.    Period  Weeks    Status  On-going      PT LONG TERM GOAL #3   Title  Morning LBP level not > 3-4/10.    Time  6    Period  Weeks     Status  On-going      PT LONG TERM GOAL #4   Title  Patient able to tolerate his normal exercise routine with 80% improvement in R shoulder.    Time  4    Period  Weeks    Status  Partially Met      PT LONG TERM GOAL #5   Title  tolerate sitting for 20 minutes with pain not >3/10    Time  4    Period  Weeks    Status  Achieved            Plan - 09/24/19 1205    Clinical Impression Statement  Pt arriving to therapy repoting 3/10 low back pain which is usually worse in the mornings. Pt feels like his back is improving. Pt was instructed in exercises to add to his HEP. Pt tolerating increased mechanical traction well today to 95#. Continiue skilled PT to progress toward his goals set.    Personal Factors and Comorbidities  Comorbidity 1;Comorbidity 2    Comorbidities  Chemotherapy due to lung cancer, hypothyroidism, left knee surgery, h/o neck pain, CAD.    Examination-Activity Limitations  Toileting    Examination-Participation Restrictions  Other    Stability/Clinical Decision Making  Evolving/Moderate complexity    Rehab Potential  Good    PT Frequency  2x / week    PT Duration  6 weeks    PT Treatment/Interventions  Manual techniques;Traction;Electrical Stimulation;Moist Heat;Cryotherapy;Functional mobility training;Therapeutic activities;Therapeutic exercise;Neuromuscular re-education;Passive range of motion;Taping    PT Next Visit Plan  assess and cont with POC may consider 85# next treatment per PT / STW/M and e'stim to left SIJ/upper gluteal region.  Progress shoulder and core strength as tolerated.    Consulted and Agree with Plan of Care  Patient       Patient will benefit from skilled therapeutic intervention  in order to improve the following deficits and impairments:  Pain, Decreased strength, Decreased range of motion, Decreased activity tolerance  Visit Diagnosis: Acute left-sided low back pain without sciatica  Chronic right shoulder pain  Muscle weakness  (generalized)     Problem List Patient Active Problem List   Diagnosis Date Noted  . Dyslipidemia 07/14/2019  . Shoulder pain 03/31/2019  . Port-A-Cath in place 08/11/2018  . Hypothyroidism (acquired) 08/11/2018  . Metastasis to lung ([place]) 05/19/2018  . Encounter for antineoplastic chemotherapy 05/13/2018  . Encounter for antineoplastic immunotherapy 05/13/2018  . Goals of care, counseling/discussion 05/13/2018  . 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 Axillary adenopathy 04/24/2018  . Right lower lobe lung mass 04/22/2018  . Cough with hemoptysis 04/22/2018  . Wheezing 04/02/2018  . Long-term use of high-risk medication 04/02/2018  . Dizziness 06/26/2017  . Fatigue 04/30/2016  . Insomnia 04/30/2016  . Hyperlipidemia 01/31/2016  . Ear pain 05/09/2015  . Anemia   . Abnormal nuclear stress test   . OBESITY, UNSPECIFIED 03/28/2009  . ESSENTIAL HYPERTENSION, BENIGN 03/28/2009  . PALPITATIONS 03/28/2009  . SNORING 03/28/2009    Oretha Caprice , PT 09/24/2019, 12:16 PM  [facility] [place], [place], [address] Phone: [phone]   Fax:  [phone]  Name: Travis Padilla MRN: [id number] Date of Birth: 1946-01-18

## 2019-09-28 ENCOUNTER — Encounter: Payer: Self-pay | Admitting: Physical Therapy

## 2019-09-28 ENCOUNTER — Ambulatory Visit: Payer: Medicare Other | Admitting: Physical Therapy

## 2019-09-28 ENCOUNTER — Other Ambulatory Visit: Payer: Self-pay

## 2019-09-28 DIAGNOSIS — M25511 Pain in right shoulder: Secondary | ICD-10-CM | POA: Diagnosis not present

## 2019-09-28 DIAGNOSIS — M545 Low back pain, unspecified: Secondary | ICD-10-CM

## 2019-09-28 DIAGNOSIS — M6281 Muscle weakness (generalized): Secondary | ICD-10-CM | POA: Diagnosis not present

## 2019-09-28 DIAGNOSIS — G8929 Other chronic pain: Secondary | ICD-10-CM | POA: Diagnosis not present

## 2019-09-28 MED ORDER — PRAVASTATIN SODIUM 40 MG PO TABS: 40.0000 mg | ORAL_TABLET | Freq: Every evening | ORAL | 3 refills | Status: DC

## 2019-09-28 NOTE — Therapy (Signed)
[facility] [place], [place], [address] Phone: [phone]   Fax:  [phone]  Physical Therapy Treatment  Patient Details  Name: Travis Padilla MRN: [id number] Date of Birth: 26-Nov-1945 Referring Provider (PT): [name] Plants MD.   Encounter Date: 09/28/2019  PT End of Session - 09/28/19 1014    Visit Number  6    Number of Visits  12    Date for PT Re-Evaluation  10/21/19    Authorization Type  FOTO.    PT Start Time  0945    PT Stop Time  1028    PT Time Calculation (min)  43 min    Activity Tolerance  Patient tolerated treatment well    Behavior During Therapy  WFL for tasks assessed/performed       Past Medical History:  Diagnosis Date  . Abnormal nuclear stress test    December, 2013  . Anemia   . CAD (coronary artery disease)    Mild nonobstructive plaque in cath 2013  . Chest pain    December, 2013  . Dizziness   . Gout   . Hemorrhoid   . Hyperlipidemia   . Hypertension   . Skin cancer   . Spinal stenosis     Past Surgical History:  Procedure Laterality Date  . 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 BELPHAROPTOSIS REPAIR Bilateral   . CATARACT EXTRACTION W/ INTRAOCULAR LENS  IMPLANT, BILATERAL    . COLONOSCOPY N/A 11/03/2014   Procedure: COLONOSCOPY;  Surgeon: Rogene Houston, MD;  [facility];  Service: Endoscopy;  Laterality: N/A;  1225  . IR IMAGING GUIDED PORT INSERTION  07/11/2018  . KNEE CARTILAGE SURGERY Left    Left knee  . SKIN CANCER EXCISION  12/2014, 04/25/15  . VIDEO BRONCHOSCOPY Bilateral 05/09/2018   Procedure: VIDEO BRONCHOSCOPY WITH FLUORO;  Surgeon: Juanito Doom, MD;  [facility];  Service: Cardiopulmonary;  Laterality: Bilateral;    There were no vitals filed for this visit.  Subjective Assessment - 09/28/19 0954    Subjective  COVID-19 screen performed prior to patient entering clinic.  Patient reported doing well after last treatment    Pertinent  History  Chemotherapy due to lung cancer, hypothyroidism, left knee surgery, h/o neck pain, CAD.    Patient Stated Goals  Reduce pain.    Currently in Pain?  Yes    Pain Score  5     Pain Location  Back    Pain Orientation  Left;Lower    Pain Descriptors / Indicators  Sore    Pain Type  Chronic pain    Pain Onset  More than a month ago    Pain Frequency  Constant    Aggravating Factors   after infusion treatments    Pain Relieving Factors  movement and rest                       [facility] Adult PT Treatment/Exercise - 09/28/19 0001      Lumbar Exercises: Stretches   Single Knee to Chest Stretch  Right;Left;5 reps      Lumbar Exercises: Seated   Other Seated Lumbar Exercises  seated on green swiss ball for core activation and holds, heel lifts 2x10, marching 3x10      Lumbar Exercises: Supine   Ab Set  5 reps    Glut Set  5 reps    Bent Knee Raise  20 reps  Bridge  20 reps    Bridge Limitations  with pelvic tilt first    Straight Leg Raise  20 reps      Shoulder Exercises: Standing   Other Standing Exercises  ext pull with blue XTS 2x20 with draw in      Traction   Type of Traction  Lumbar    Min (lbs)  5    Max (lbs)  95    Hold Time  99    Rest Time  5    Time  15                  PT Long Term Goals - 09/28/19 1002      PT LONG TERM GOAL #1   Title  I with HEP    Time  4    Period  Weeks    Status  On-going      PT LONG TERM GOAL #2   Title  Patient able to reach behind back with right hand to L2.    Time  6    Period  Weeks    Status  On-going      PT LONG TERM GOAL #3   Title  Morning LBP level not > 3-4/10.    Time  6    Period  Weeks    Status  On-going      PT LONG TERM GOAL #4   Title  Patient able to tolerate his normal exercise routine with 80% improvement in R shoulder.    Time  4    Period  Weeks    Status  Partially Met      PT LONG TERM GOAL #5   Title  tolerate sitting for 20 minutes with pain not >3/10     Time  4    Period  Weeks    Status  Achieved            Plan - 09/28/19 1019    Clinical Impression Statement  Patient tolerated treatment well today. Patient able to progress with core activation exercises. Patent requested core exericses only. Patient has some ongoing limitations with right UE yet he is doing several exercises to improve his deficts. Today reviewed exercies for right shoulder that he does daily at home. Today his back hurts more than shoulder and would like to continue to focus on core and back exercise. Patient continues to respond well to traction. Goals progressing at this time.    Personal Factors and Comorbidities  Comorbidity 1;Comorbidity 2    Comorbidities  Chemotherapy due to lung cancer, hypothyroidism, left knee surgery, h/o neck pain, CAD.    Examination-Activity Limitations  Toileting    Examination-Participation Restrictions  Other    Stability/Clinical Decision Making  Evolving/Moderate complexity    Rehab Potential  Good    PT Frequency  2x / week    PT Duration  6 weeks    PT Treatment/Interventions  Manual techniques;Traction;Electrical Stimulation;Moist Heat;Cryotherapy;Functional mobility training;Therapeutic activities;Therapeutic exercise;Neuromuscular re-education;Passive range of motion;Taping    PT Next Visit Plan  assess and cont with POC with traction / STW/M and e'stim to left SIJ/upper gluteal region.  Progress shoulder and core strength as tolerated.    Consulted and Agree with Plan of Care  Patient       Patient will benefit from skilled therapeutic intervention in order to improve the following deficits and impairments:  Pain, Decreased strength, Decreased range of motion, Decreased activity tolerance  Visit Diagnosis: Acute left-sided low   back pain without sciatica  Chronic right shoulder pain  Muscle weakness (generalized)     Problem List Patient Active Problem List   Diagnosis Date Noted  . Dyslipidemia 07/14/2019  .  Shoulder pain 03/31/2019  . Port-A-Cath in place 08/11/2018  . Hypothyroidism (acquired) 08/11/2018  . Metastasis to lung ([place]) 05/19/2018  . Encounter for antineoplastic chemotherapy 05/13/2018  . Encounter for antineoplastic immunotherapy 05/13/2018  . Goals of care, counseling/discussion 05/13/2018  . 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) 05/13/2018  . Axillary adenopathy 04/24/2018  . Right lower lobe lung mass 04/22/2018  . Cough with hemoptysis 04/22/2018  . Wheezing 04/02/2018  . Long-term use of high-risk medication 04/02/2018  . Dizziness 06/26/2017  . Fatigue 04/30/2016  . Insomnia 04/30/2016  . Hyperlipidemia 01/31/2016  . Ear pain 05/09/2015  . Anemia   . Abnormal nuclear stress test   . OBESITY, UNSPECIFIED 03/28/2009  . ESSENTIAL HYPERTENSION, BENIGN 03/28/2009  . PALPITATIONS 03/28/2009  . SNORING 03/28/2009    Anesa Fronek P, PTA 09/28/2019, 10:42 AM  [facility] [place], [place], [address] Phone: [phone]   Fax:  [phone]  Name: Travis Padilla MRN: [id number] Date of Birth: [birth year]

## 2019-10-01 ENCOUNTER — Other Ambulatory Visit: Payer: Self-pay

## 2019-10-01 ENCOUNTER — Ambulatory Visit: Payer: Medicare Other | Admitting: *Deleted

## 2019-10-01 DIAGNOSIS — M25511 Pain in right shoulder: Secondary | ICD-10-CM | POA: Diagnosis not present

## 2019-10-01 DIAGNOSIS — M6281 Muscle weakness (generalized): Secondary | ICD-10-CM | POA: Diagnosis not present

## 2019-10-01 DIAGNOSIS — M545 Low back pain, unspecified: Secondary | ICD-10-CM

## 2019-10-01 DIAGNOSIS — G8929 Other chronic pain: Secondary | ICD-10-CM | POA: Diagnosis not present

## 2019-10-01 NOTE — Therapy (Signed)
[facility] [place], [place], [address] Phone: [phone]   Fax:  [phone]  Physical Therapy Treatment  Patient Details  Name: Travis Padilla MRN: [id number] Date of Birth: 12-31-1945 Referring Provider (PT): [name] Plants MD.   Encounter Date: 10/01/2019  PT End of Session - 10/01/19 0950    Visit Number  7    Number of Visits  12    Date for PT Re-Evaluation  10/21/19    Authorization Type  FOTO.    PT Start Time  0945    PT Stop Time  1035    PT Time Calculation (min)  50 min       Past Medical History:  Diagnosis Date  . Abnormal nuclear stress test    December, 2013  . Anemia   . CAD (coronary artery disease)    Mild nonobstructive plaque in cath 2013  . Chest pain    December, 2013  . Dizziness   . Gout   . Hemorrhoid   . Hyperlipidemia   . Hypertension   . Skin cancer   . Spinal stenosis     Past Surgical History:  Procedure Laterality Date  . 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 BELPHAROPTOSIS REPAIR Bilateral   . CATARACT EXTRACTION W/ INTRAOCULAR LENS  IMPLANT, BILATERAL    . COLONOSCOPY N/A 11/03/2014   Procedure: COLONOSCOPY;  Surgeon: Rogene Houston, MD;  [facility];  Service: Endoscopy;  Laterality: N/A;  1225  . IR IMAGING GUIDED PORT INSERTION  07/11/2018  . KNEE CARTILAGE SURGERY Left    Left knee  . SKIN CANCER EXCISION  12/2014, 04/25/15  . VIDEO BRONCHOSCOPY Bilateral 05/09/2018   Procedure: VIDEO BRONCHOSCOPY WITH FLUORO;  Surgeon: Juanito Doom, MD;  Location: [facility];  Service: Cardiopulmonary;  Laterality: Bilateral;    There were no vitals filed for this visit.  Subjective Assessment - 10/01/19 0947    Subjective  COVID-19 screen performed prior to patient entering clinic.  Patient reported doing ok after last treatment    Pertinent History  Chemotherapy due to lung cancer, hypothyroidism, left knee surgery, h/o neck pain, CAD.    Patient Stated  Goals  Reduce pain.    Currently in Pain?  Yes    Pain Score  5     Pain Location  Back    Pain Orientation  Left    Pain Onset  More than a month ago                       [facility] Adult PT Treatment/Exercise - 10/01/19 0001      Therapeutic Activites    Therapeutic Activities  ADL's      Lumbar Exercises: Stretches   Single Knee to Chest Stretch  Right;Left;5 reps      Lumbar Exercises: Supine   Ab Set  5 reps    Bent Knee Raise  20 reps    Dead Bug  20 reps    Bridge  20 reps    Other Supine Lumbar Exercises  Reviewed all HEP      Lumbar Exercises: Prone   Straight Leg Raise  10 reps      Lumbar Exercises: Quadruped   Straight Leg Raise  10 reps      Traction   Type of Traction  Lumbar    Min (lbs)  5    Max (lbs)  95    Hold Time  99    Rest Time  5    Time  15                  PT Long Term Goals - 09/28/19 1002      PT LONG TERM GOAL #1   Title  I with HEP    Time  4    Period  Weeks    Status  On-going      PT LONG TERM GOAL #2   Title  Patient able to reach behind back with right hand to L2.    Time  6    Period  Weeks    Status  On-going      PT LONG TERM GOAL #3   Title  Morning LBP level not > 3-4/10.    Time  6    Period  Weeks    Status  On-going      PT LONG TERM GOAL #4   Title  Patient able to tolerate his normal exercise routine with 80% improvement in R shoulder.    Time  4    Period  Weeks    Status  Partially Met      PT LONG TERM GOAL #5   Title  tolerate sitting for 20 minutes with pain not >3/10    Time  4    Period  Weeks    Status  Achieved            Plan - 10/01/19 1047    Clinical Impression Statement  pt arrived today with questions about HEP still for shldr and LB. HEP reviewed with added QP leg raise. Body mechanics reviewed for ADLs. Traction was maintained at 95#s and Pt is still assessing if traction is helping or not.    Personal Factors and Comorbidities  Comorbidity  1;Comorbidity 2    Comorbidities  Chemotherapy due to lung cancer, hypothyroidism, left knee surgery, h/o neck pain, CAD.    Examination-Activity Limitations  Toileting    Examination-Participation Restrictions  Other    Stability/Clinical Decision Making  Evolving/Moderate complexity    Rehab Potential  Good    PT Frequency  2x / week    PT Duration  6 weeks    PT Treatment/Interventions  Manual techniques;Traction;Electrical Stimulation;Moist Heat;Cryotherapy;Functional mobility training;Therapeutic activities;Therapeutic exercise;Neuromuscular re-education;Passive range of motion;Taping    PT Next Visit Plan  assess  traction              STW/M and e'stim to left SIJ/upper gluteal region.  Progress shoulder and core strength as tolerated.       Patient will benefit from skilled therapeutic intervention in order to improve the following deficits and impairments:  Pain, Decreased strength, Decreased range of motion, Decreased activity tolerance  Visit Diagnosis: Chronic right shoulder pain  Acute left-sided low back pain without sciatica  Muscle weakness (generalized)     Problem List Patient Active Problem List   Diagnosis Date Noted  . Dyslipidemia 07/14/2019  . Shoulder pain 03/31/2019  . Port-A-Cath in place 08/11/2018  . Hypothyroidism (acquired) 08/11/2018  . Metastasis to lung ([place]) 05/19/2018  . Encounter for antineoplastic chemotherapy 05/13/2018  . Encounter for antineoplastic immunotherapy 05/13/2018  . Goals of care, counseling/discussion 05/13/2018  . 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 Axillary adenopathy 04/24/2018  . Right lower lobe lung mass 04/22/2018  . Cough with hemoptysis 04/22/2018  . Wheezing 04/02/2018  . Long-term use of high-risk medication 04/02/2018  . Dizziness  06/26/2017  . Fatigue 04/30/2016  . Insomnia 04/30/2016  . Hyperlipidemia 01/31/2016  . Ear pain 05/09/2015  . Anemia   . Abnormal nuclear stress  test   . OBESITY, UNSPECIFIED 03/28/2009  . ESSENTIAL HYPERTENSION, BENIGN 03/28/2009  . PALPITATIONS 03/28/2009  . SNORING 03/28/2009    ,CHRIS, PTA 10/01/2019, 10:54 AM  [facility] [address] [place], [place], [address] Phone: [phone]   Fax:  [phone]  Name: Travis Padilla MRN: [id number] Date of Birth: July 09, 1946

## 2019-10-05 ENCOUNTER — Other Ambulatory Visit: Payer: Self-pay

## 2019-10-05 ENCOUNTER — Ambulatory Visit: Payer: Medicare Other | Admitting: Physical Therapy

## 2019-10-05 ENCOUNTER — Encounter: Payer: Self-pay | Admitting: Physical Therapy

## 2019-10-05 DIAGNOSIS — M545 Low back pain, unspecified: Secondary | ICD-10-CM

## 2019-10-05 DIAGNOSIS — M6281 Muscle weakness (generalized): Secondary | ICD-10-CM | POA: Diagnosis not present

## 2019-10-05 DIAGNOSIS — G8929 Other chronic pain: Secondary | ICD-10-CM | POA: Diagnosis not present

## 2019-10-05 DIAGNOSIS — M25511 Pain in right shoulder: Secondary | ICD-10-CM | POA: Diagnosis not present

## 2019-10-05 NOTE — Therapy (Signed)
[facility] [place], [place], [address] Phone: [phone]   Fax:  [phone]  Physical Therapy Treatment  Patient Details  Name: Travis Padilla MRN: [id number] Date of Birth: [birth year] Referring Provider (PT): [name] Plants MD.   Encounter Date: 10/05/2019  PT End of Session - 10/05/19 1024    Visit Number  8    Number of Visits  12    Date for PT Re-Evaluation  10/21/19    Authorization Type  FOTO.    PT Start Time  0945    PT Stop Time  1029    PT Time Calculation (min)  44 min    Activity Tolerance  Patient tolerated treatment well    Behavior During Therapy  WFL for tasks assessed/performed       Past Medical History:  Diagnosis Date  . Abnormal nuclear stress test    December, 2013  . Anemia   . CAD (coronary artery disease)    Mild nonobstructive plaque in cath 2013  . Chest pain    December, 2013  . Dizziness   . Gout   . Hemorrhoid   . Hyperlipidemia   . Hypertension   . Skin cancer   . Spinal stenosis     Past Surgical History:  Procedure Laterality Date  . 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 BELPHAROPTOSIS REPAIR Bilateral   . CATARACT EXTRACTION W/ INTRAOCULAR LENS  IMPLANT, BILATERAL    . COLONOSCOPY N/A 11/03/2014   Procedure: COLONOSCOPY;  Surgeon: Rogene Houston, MD;  [facility];  Service: Endoscopy;  Laterality: N/A;  1225  . IR IMAGING GUIDED PORT INSERTION  07/11/2018  . KNEE CARTILAGE SURGERY Left    Left knee  . SKIN CANCER EXCISION  12/2014, 04/25/15  . VIDEO BRONCHOSCOPY Bilateral 05/09/2018   Procedure: VIDEO BRONCHOSCOPY WITH FLUORO;  Surgeon: Juanito Doom, MD;  [facility];  Service: Cardiopulmonary;  Laterality: Bilateral;    There were no vitals filed for this visit.  Subjective Assessment - 10/05/19 0953    Subjective  COVID-19 screen performed prior to patient entering clinic.  Patient reported more pain over weeknd and would like to hold  off on traction    Pertinent History  Chemotherapy due to lung cancer, hypothyroidism, left knee surgery, h/o neck pain, CAD.    Patient Stated Goals  Reduce pain.    Currently in Pain?  Yes    Pain Score  4     Pain Location  Back    Pain Orientation  Left    Pain Descriptors / Indicators  Sore    Pain Type  Chronic pain    Pain Onset  More than a month ago    Pain Frequency  Constant    Aggravating Factors   at times after prolong activity and or infusion treatments    Pain Relieving Factors  rest and moderate activity    Pain Score  5    Pain Location  Shoulder    Pain Orientation  Right    Pain Type  Chronic pain    Pain Onset  More than a month ago    Pain Frequency  Intermittent    Aggravating Factors   using a hammer yesterday    Pain Relieving Factors  rest                       [facility] Adult PT Treatment/Exercise - 10/05/19 0001  Lumbar Exercises: Aerobic   Nustep  L1-5 x77mn 70-90 SPM      Lumbar Exercises: Seated   Other Seated Lumbar Exercises  seated on green swiss ball for core activation and holds, marching 3x10, and rows/march with red t-bnd 2x10      Lumbar Exercises: Supine   Bent Knee Raise  20 reps    Bent Knee Raise Limitations  marching with 4#alt ball to knee 2x10    Bridge  20 reps;3 seconds    Bridge Limitations  green t-band    Straight Leg Raise  20 reps      Lumbar Exercises: Quadruped   Opposite Arm/Leg Raise  Right arm/Left leg;Left arm/Right leg;10 reps      Shoulder Exercises: Standing   Other Standing Exercises  ext pull with green XTS 2x20 with draw in    Other Standing Exercises  standing 4# reachouts and woodchops 2x10 each      Shoulder Exercises: ROM/Strengthening   UBE (Upper Arm Bike)  652m 90 RPM    Wall Pushups  10 reps                  PT Long Term Goals - 10/05/19 1025      PT LONG TERM GOAL #1   Title  I with HEP    Time  4    Period  Weeks    Status  Achieved   10/05/19     PT LONG  TERM GOAL #2   Title  Patient able to reach behind back with right hand to L2.    Time  6    Period  Weeks    Status  On-going      PT LONG TERM GOAL #3   Title  Morning LBP level not > 3-4/10.    Time  6    Period  Weeks    Status  On-going   up to 8/10 10/05/19     PT LONG TERM GOAL #4   Title  --            Plan - 10/05/19 1027    Clinical Impression Statement  Patient tolerated treatment well today with some ongoing pain that is up to 8/10 in the mornings. Patient able to progress core exercises and shoulder exercises today with good form and understanding. Patient requested to hold the traction at this time and focus on core and shoulder. Patient doing HEP daily and met LTG #1 with others ongoing.    Personal Factors and Comorbidities  Comorbidity 1;Comorbidity 2    Comorbidities  Chemotherapy due to lung cancer, hypothyroidism, left knee surgery, h/o neck pain, CAD.    Examination-Activity Limitations  Toileting    Examination-Participation Restrictions  Other    Stability/Clinical Decision Making  Evolving/Moderate complexity    Rehab Potential  Good    PT Frequency  2x / week    PT Duration  6 weeks    PT Treatment/Interventions  Manual techniques;Traction;Electrical Stimulation;Moist Heat;Cryotherapy;Functional mobility training;Therapeutic activities;Therapeutic exercise;Neuromuscular re-education;Passive range of motion;Taping    PT Next Visit Plan  cont with core and shoulder strengthening and STW as needed    Consulted and Agree with Plan of Care  Patient       Patient will benefit from skilled therapeutic intervention in order to improve the following deficits and impairments:  Pain, Decreased strength, Decreased range of motion, Decreased activity tolerance  Visit Diagnosis: Acute left-sided low back pain without sciatica  Muscle weakness (generalized)  Chronic right  shoulder pain     Problem List Patient Active Problem List   Diagnosis Date Noted   . Dyslipidemia 07/14/2019  . Shoulder pain 03/31/2019  . Port-A-Cath in place 08/11/2018  . Hypothyroidism (acquired) 08/11/2018  . Metastasis to lung ([place]) 05/19/2018  . Encounter for antineoplastic chemotherapy 05/13/2018  . Encounter for antineoplastic immunotherapy 05/13/2018  . Goals of care, counseling/discussion 05/13/2018  . 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 Axillary adenopathy 04/24/2018  . Right lower lobe lung mass 04/22/2018  . Cough with hemoptysis 04/22/2018  . Wheezing 04/02/2018  . Long-term use of high-risk medication 04/02/2018  . Dizziness 06/26/2017  . Fatigue 04/30/2016  . Insomnia 04/30/2016  . Hyperlipidemia 01/31/2016  . Ear pain 05/09/2015  . Anemia   . Abnormal nuclear stress test   . OBESITY, UNSPECIFIED 03/28/2009  . ESSENTIAL HYPERTENSION, BENIGN 03/28/2009  . PALPITATIONS 03/28/2009  . SNORING 03/28/2009    Shyvonne Chastang P, PTA 10/05/2019, 10:32 AM  [facility] [place], [place], [address] Phone: [phone]   Fax:  [phone]  Name: Travis Padilla MRN: [id number] Date of Birth: 1946/07/18

## 2019-10-06 ENCOUNTER — Inpatient Hospital Stay: Payer: Medicare Other

## 2019-10-06 ENCOUNTER — Encounter: Payer: Self-pay | Admitting: Internal Medicine

## 2019-10-06 ENCOUNTER — Inpatient Hospital Stay (HOSPITAL_BASED_OUTPATIENT_CLINIC_OR_DEPARTMENT_OTHER): Payer: Medicare Other | Admitting: Internal Medicine

## 2019-10-06 ENCOUNTER — Other Ambulatory Visit: Payer: Self-pay | Admitting: Internal Medicine

## 2019-10-06 ENCOUNTER — Other Ambulatory Visit: Payer: Self-pay

## 2019-10-06 VITALS — BP 111/82 | HR 78 | Temp 97.8°F | Resp 17 | Ht 74.0 in | Wt 239.4 lb

## 2019-10-06 DIAGNOSIS — C3491 Malignant neoplasm of unspecified part of right bronchus or lung: Secondary | ICD-10-CM

## 2019-10-06 DIAGNOSIS — Z923 Personal history of irradiation: Secondary | ICD-10-CM | POA: Diagnosis not present

## 2019-10-06 DIAGNOSIS — Z95828 Presence of other vascular implants and grafts: Secondary | ICD-10-CM

## 2019-10-06 DIAGNOSIS — Z5112 Encounter for antineoplastic immunotherapy: Secondary | ICD-10-CM

## 2019-10-06 DIAGNOSIS — E039 Hypothyroidism, unspecified: Secondary | ICD-10-CM | POA: Diagnosis not present

## 2019-10-06 DIAGNOSIS — R911 Solitary pulmonary nodule: Secondary | ICD-10-CM | POA: Diagnosis not present

## 2019-10-06 DIAGNOSIS — C7801 Secondary malignant neoplasm of right lung: Secondary | ICD-10-CM

## 2019-10-06 DIAGNOSIS — K5900 Constipation, unspecified: Secondary | ICD-10-CM | POA: Diagnosis not present

## 2019-10-06 DIAGNOSIS — M2550 Pain in unspecified joint: Secondary | ICD-10-CM | POA: Diagnosis not present

## 2019-10-06 LAB — CBC WITH DIFFERENTIAL (CANCER CENTER ONLY)
Abs Immature Granulocytes: 0.01 10*3/uL (ref 0.00–0.07)
Basophils Absolute: 0 10*3/uL (ref 0.0–0.1)
Basophils Relative: 1 %
Eosinophils Absolute: 0.3 10*3/uL (ref 0.0–0.5)
Eosinophils Relative: 5 %
HCT: 38.3 % — ABNORMAL LOW (ref 39.0–52.0)
Hemoglobin: 13.2 g/dL (ref 13.0–17.0)
Immature Granulocytes: 0 %
Lymphocytes Relative: 20 %
Lymphs Abs: 1.2 10*3/uL (ref 0.7–4.0)
MCH: 31.2 pg (ref 26.0–34.0)
MCHC: 34.5 g/dL (ref 30.0–36.0)
MCV: 90.5 fL (ref 80.0–100.0)
Monocytes Absolute: 0.8 10*3/uL (ref 0.1–1.0)
Monocytes Relative: 14 %
Neutro Abs: 3.5 10*3/uL (ref 1.7–7.7)
Neutrophils Relative %: 60 %
Platelet Count: 155 10*3/uL (ref 150–400)
RBC: 4.23 MIL/uL (ref 4.22–5.81)
RDW: 11.9 % (ref 11.5–15.5)
WBC Count: 5.8 10*3/uL (ref 4.0–10.5)
nRBC: 0 % (ref 0.0–0.2)

## 2019-10-06 LAB — CMP (CANCER CENTER ONLY)
ALT: 12 U/L (ref 0–44)
AST: 17 U/L (ref 15–41)
Albumin: 4.2 g/dL (ref 3.5–5.0)
Alkaline Phosphatase: 55 U/L (ref 38–126)
Anion gap: 8 (ref 5–15)
BUN: 16 mg/dL (ref 8–23)
CO2: 24 mmol/L (ref 22–32)
Calcium: 9.2 mg/dL (ref 8.9–10.3)
Chloride: 107 mmol/L (ref 98–111)
Creatinine: 0.95 mg/dL (ref 0.61–1.24)
GFR, Est AFR Am: >60 mL/min (ref >60)
GFR, Estimated: >60 mL/min (ref >60)
Glucose, Bld: 98 mg/dL (ref 70–99)
Potassium: 4.5 mmol/L (ref 3.5–5.1)
Sodium: 139 mmol/L (ref 135–145)
Total Bilirubin: 0.6 mg/dL (ref 0.3–1.2)
Total Protein: 7.7 g/dL (ref 6.5–8.1)

## 2019-10-06 LAB — TSH: TSH: 2.604 u[IU]/mL (ref 0.320–4.118)

## 2019-10-06 MED ORDER — SODIUM CHLORIDE 0.9 % IV SOLN
200.0000 mg | Freq: Once | INTRAVENOUS | Status: AC
  Administered 2019-10-06: 13:00:00 200 mg via INTRAVENOUS
  Filled 2019-10-06: qty 8

## 2019-10-06 MED ORDER — SODIUM CHLORIDE 0.9% FLUSH
10.0000 mL | INTRAVENOUS | Status: DC | PRN
  Administered 2019-10-06: 10 mL
  Filled 2019-10-06: qty 10

## 2019-10-06 MED ORDER — SODIUM CHLORIDE 0.9 % IV SOLN
Freq: Once | INTRAVENOUS | Status: AC
  Filled 2019-10-06: qty 250

## 2019-10-06 MED ORDER — HEPARIN SOD (PORK) LOCK FLUSH 100 UNIT/ML IV SOLN
500.0000 [IU] | Freq: Once | INTRAVENOUS | Status: AC | PRN
  Administered 2019-10-06: 500 [IU]
  Filled 2019-10-06: qty 5

## 2019-10-06 MED ORDER — SODIUM CHLORIDE 0.9% FLUSH
10.0000 mL | Freq: Once | INTRAVENOUS | Status: AC
  Administered 2019-10-06: 10 mL
  Filled 2019-10-06: qty 10

## 2019-10-06 NOTE — Patient Instructions (Signed)
[facility] Discharge Instructions for Patients Receiving Chemotherapy  Today you received the following chemotherapy agents :  Pembrolizumab.  To help prevent nausea and vomiting after your treatment, we encourage you to take your nausea medication as prescribed.   If you develop nausea and vomiting that is not controlled by your nausea medication, call the clinic.   BELOW ARE SYMPTOMS THAT SHOULD BE REPORTED IMMEDIATELY:  *FEVER GREATER THAN 100.5 F  *CHILLS WITH OR WITHOUT FEVER  NAUSEA AND VOMITING THAT IS NOT CONTROLLED WITH YOUR NAUSEA MEDICATION  *UNUSUAL SHORTNESS OF BREATH  *UNUSUAL BRUISING OR BLEEDING  TENDERNESS IN MOUTH AND THROAT WITH OR WITHOUT PRESENCE OF ULCERS  *URINARY PROBLEMS  *BOWEL PROBLEMS  UNUSUAL RASH Items with * indicate a potential emergency and should be followed up as soon as possible.  Feel free to call the clinic should you have any questions or concerns. The clinic phone number is [phone].  Please show the [facility] at check-in to the Emergency Department and triage nurse.

## 2019-10-06 NOTE — Progress Notes (Signed)
[place] [phone]   [phone]  OFFICE PROGRESS NOTE  [name], DO [place] [address]  DIAGNOSIS: Stage IV (T3, N0, M1c) non-small cell lung cancer, squamous cell carcinoma presented with large right infrahilar mass in addition to left upper lobe lung nodule as well as left axillary mass with left axillary lymph node diagnosed in August 2019.  PRIOR THERAPY:  1) Palliative radiotherapy to the right infrahilar mass as well as the axillary mass under the care of Dr. Lisbeth Renshaw. 2) Systemic chemotherapy with carboplatin for AUC of 5, paclitaxel 175 mg/M2 and Keytruda 200 mg IV every 3 weeks status post 4 cycles.  CURRENT THERAPY: Maintenance immunotherapy with single agent Keytruda 200 mg IV every 3 weeks status post 21 cycles.  INTERVAL HISTORY: Travis Padilla 74 y.o. male returns to the clinic today for follow-up visit.  The patient is feeling fine today with no concerning complaints except for the lower back pain and he is currently undergoing physical therapy.  He denied having any current chest pain, shortness of breath, cough or hemoptysis.  He denied having any fever or chills.  He has no nausea, vomiting, diarrhea or constipation.  He is here today for evaluation before starting cycle #22 of his maintenance treatment with Keytruda.   MEDICAL HISTORY: Past Medical History:  Diagnosis Date  . Abnormal nuclear stress test    December, 2013  . Anemia   . CAD (coronary artery disease)    Mild nonobstructive plaque in cath 2013  . Chest pain    December, 2013  . Dizziness   . Gout   . Hemorrhoid   . Hyperlipidemia   . Hypertension   . Skin cancer   . Spinal stenosis     ALLERGIES:  is allergic to trazodone and nefazodone.  MEDICATIONS:  Current Outpatient Medications  Medication Sig Dispense Refill  . albuterol (PROVENTIL) (2.5 MG/3ML) 0.083% nebulizer solution Take 3 mLs (2.5 mg total) by nebulization  every 6 (six) hours as needed for wheezing or shortness of breath. (Patient not taking: Reported on 08/25/2019) 75 mL 12  . ALPRAZolam (XANAX) 0.25 MG tablet Take 1 tablet (0.25 mg total) by mouth at bedtime as needed for anxiety. 15 tablet 0  . Apple Cider Vinegar 500 MG TABS Take by mouth.    . Artificial Tear Solution (SOOTHE XP OP) Place 1 drop into both eyes 2 (two) times daily.    . carvedilol (COREG) 12.5 MG tablet Take 1 tablet (12.5 mg total) by mouth 2 (two) times daily with a meal. 180 tablet 3  . Cyanocobalamin (VITAMIN B 12) 250 MCG LOZG Take by mouth.    . diphenhydramine-acetaminophen (TYLENOL PM) 25-500 MG TABS tablet Take 1-2 tablets by mouth at bedtime as needed (pain).    . ferrous sulfate 324 MG TBEC Take 324 mg by mouth daily with breakfast.    . levothyroxine (SYNTHROID) 150 MCG tablet TAKE 1 TABLET BY MOUTH EVERY DAY BEFORE BREAKFAST 30 tablet 0  . lidocaine-prilocaine (EMLA) cream Apply 1 application topically as needed. 30 g 1  . methocarbamol (ROBAXIN) 500 MG tablet Take 500 mg by mouth daily.    . mirtazapine (REMERON) 15 MG tablet TAKE 1 TABLET BY MOUTH EVERYDAY AT BEDTIME 90 tablet 1  . Multiple Vitamins-Minerals (PRESERVISION AREDS 2 PO) Take by mouth.    . oxyCODONE-acetaminophen (PERCOCET/ROXICET) 5-325 MG tablet Take 1 tablet by mouth every 8 (eight) hours as needed  for severe pain. 30 tablet 0  . pravastatin (PRAVACHOL) 40 MG tablet Take 1 tablet (40 mg total) by mouth every evening. 90 tablet 3  . prochlorperazine (COMPAZINE) 10 MG tablet Take 1 tablet (10 mg total) by mouth every 6 (six) hours as needed for nausea or vomiting. (Patient not taking: Reported on 08/25/2019) 30 tablet 1  . senna-docusate (SENNA S) 8.6-50 MG tablet 1 to 2 twice daily for constipation 120 tablet 5   No current facility-administered medications for this visit.    SURGICAL HISTORY:  Past Surgical History:  Procedure Laterality Date  . 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  BELPHAROPTOSIS REPAIR Bilateral   . CATARACT EXTRACTION W/ INTRAOCULAR LENS  IMPLANT, BILATERAL    . COLONOSCOPY N/A 11/03/2014   Procedure: COLONOSCOPY;  Surgeon: Rogene Houston, MD;  [facility];  Service: Endoscopy;  Laterality: N/A;  1225  . IR IMAGING GUIDED PORT INSERTION  07/11/2018  . KNEE CARTILAGE SURGERY Left    Left knee  . SKIN CANCER EXCISION  12/2014, 04/25/15  . VIDEO BRONCHOSCOPY Bilateral 05/09/2018   Procedure: VIDEO BRONCHOSCOPY WITH FLUORO;  Surgeon: Juanito Doom, MD;  [facility];  Service: Cardiopulmonary;  Laterality: Bilateral;    REVIEW OF SYSTEMS:  A comprehensive review of systems was negative except for: Musculoskeletal: positive for back pain   PHYSICAL EXAMINATION: General appearance: alert, cooperative and no distress Head: Normocephalic, without obvious abnormality, atraumatic Neck: no adenopathy, no JVD, supple, symmetrical, trachea midline and thyroid not enlarged, symmetric, no tenderness/mass/nodules Lymph nodes: Cervical, supraclavicular, and axillary nodes normal. Resp: clear to auscultation bilaterally Back: symmetric, no curvature. ROM normal. No CVA tenderness. Cardio: regular rate and rhythm, S1, S2 normal, no murmur, click, rub or gallop GI: soft, non-tender; bowel sounds normal; no masses,  no organomegaly Extremities: extremities normal, atraumatic, no cyanosis or edema  ECOG PERFORMANCE STATUS: 1 - Symptomatic but completely ambulatory  Blood pressure 111/82, pulse 78, temperature 97.8 F (36.6 C), temperature source Oral, resp. rate 17, height 6\' 2"  (1.88 m), weight 239 lb 6.4 oz (108.6 kg), SpO2 100 %.  LABORATORY DATA: Lab Results  Component Value Date   WBC 5.8 10/06/2019   HGB 13.2 10/06/2019   HCT 38.3 (L) 10/06/2019   MCV 90.5 10/06/2019   PLT 155 10/06/2019      Chemistry      Component Value Date/Time   NA 141 09/15/2019 0849   NA 144 04/09/2018 1507   K 4.0 09/15/2019 0849   CL 108  09/15/2019 0849   CO2 25 09/15/2019 0849   BUN 12 09/15/2019 0849   BUN 15 04/09/2018 1507   CREATININE 0.84 09/15/2019 0849      Component Value Date/Time   CALCIUM 8.9 09/15/2019 0849   ALKPHOS 49 09/15/2019 0849   AST 19 09/15/2019 0849   ALT 18 09/15/2019 0849   BILITOT 0.7 09/15/2019 0849       RADIOGRAPHIC STUDIES: No results found.  ASSESSMENT AND PLAN: This is a very pleasant 74 years old white male with very light most smoking history recently diagnosed with stage IV (T3, N0, M1c) non-small cell lung cancer, squamous cell carcinoma based on the biopsy from the left axillary mass. He status post 4 cycles of induction systemic chemotherapy with carboplatin, paclitaxel and Keytruda with partial response.  The patient is currently on maintenance treatment with single agent Keytruda status post 21 cycles. The patient has been tolerating this treatment well with no concerning  adverse effects. I recommended for him to proceed with cycle #22 today as planned. He will come back for follow-up visit in 3 weeks for evaluation before the next cycle of his treatment. For the hypothyroidism, he is currently on levothyroxine 150 mcg p.o. daily.  We will continue to monitor his TSH closely and adjust his dose as needed. The patient was advised to call immediately if he has any concerning symptoms in the interval. The patient voices understanding of current disease status and treatment options and is in agreement with the current care plan. All questions were answered. The patient knows to call the clinic with any problems, questions or concerns. We can certainly see the patient much sooner if necessary.  Disclaimer: This note was dictated with voice recognition software. Similar sounding words can inadvertently be transcribed and may not be corrected upon review.

## 2019-10-07 ENCOUNTER — Telehealth: Payer: Self-pay | Admitting: Internal Medicine

## 2019-10-07 NOTE — Telephone Encounter (Signed)
Scheduled per los. Called and left msg. Mailed printout  °

## 2019-10-08 ENCOUNTER — Encounter: Payer: Self-pay | Admitting: Physical Therapy

## 2019-10-08 ENCOUNTER — Other Ambulatory Visit: Payer: Self-pay

## 2019-10-08 ENCOUNTER — Ambulatory Visit: Payer: Medicare Other | Admitting: Physical Therapy

## 2019-10-08 DIAGNOSIS — M6281 Muscle weakness (generalized): Secondary | ICD-10-CM

## 2019-10-08 DIAGNOSIS — M545 Low back pain, unspecified: Secondary | ICD-10-CM

## 2019-10-08 DIAGNOSIS — G8929 Other chronic pain: Secondary | ICD-10-CM

## 2019-10-08 DIAGNOSIS — M25511 Pain in right shoulder: Secondary | ICD-10-CM | POA: Diagnosis not present

## 2019-10-08 NOTE — Therapy (Signed)
[facility] [place], [place], [address] Phone: [phone]   Fax:  [phone]  Physical Therapy Treatment  Patient Details  Name: Travis Padilla MRN: [id number] Date of Birth: 08-10-1946 Referring Provider (PT): [name] Plants MD.   Encounter Date: 10/08/2019  PT End of Session - 10/08/19 1004    Visit Number  9    Number of Visits  12    Date for PT Re-Evaluation  10/21/19    Authorization Type  FOTO.    PT Start Time  [phone]    PT Stop Time  1029    PT Time Calculation (min)  43 min    Activity Tolerance  Patient tolerated treatment well    Behavior During Therapy  WFL for tasks assessed/performed       Past Medical History:  Diagnosis Date  . Abnormal nuclear stress test    December, 2013  . Anemia   . CAD (coronary artery disease)    Mild nonobstructive plaque in cath 2013  . Chest pain    December, 2013  . Dizziness   . Gout   . Hemorrhoid   . Hyperlipidemia   . Hypertension   . Skin cancer   . Spinal stenosis     Past Surgical History:  Procedure Laterality Date  . 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 BELPHAROPTOSIS REPAIR Bilateral   . CATARACT EXTRACTION W/ INTRAOCULAR LENS  IMPLANT, BILATERAL    . COLONOSCOPY N/A 11/03/2014   Procedure: COLONOSCOPY;  Surgeon: Rogene Houston, MD;  [facility];  Service: Endoscopy;  Laterality: N/A;  1225  . IR IMAGING GUIDED PORT INSERTION  07/11/2018  . KNEE CARTILAGE SURGERY Left    Left knee  . SKIN CANCER EXCISION  12/2014, 04/25/15  . VIDEO BRONCHOSCOPY Bilateral 05/09/2018   Procedure: VIDEO BRONCHOSCOPY WITH FLUORO;  Surgeon: Juanito Doom, MD;  [facility];  Service: Cardiopulmonary;  Laterality: Bilateral;    There were no vitals filed for this visit.  Subjective Assessment - 10/08/19 0958    Subjective  COVID-19 screen performed prior to patient entering clinic.  Patient reported pain due to infusion past tuesday     Pertinent History  Chemotherapy due to lung cancer, hypothyroidism, left knee surgery, h/o neck pain, CAD.    Patient Stated Goals  Reduce pain.    Currently in Pain?  Yes    Pain Score  7     Pain Location  Back    Pain Orientation  Left    Pain Descriptors / Indicators  Discomfort    Pain Type  Chronic pain    Pain Onset  More than a month ago    Pain Frequency  Constant    Aggravating Factors   after infusion treatments and in AM    Pain Relieving Factors  meds and rest                       [facility] Adult PT Treatment/Exercise - 10/08/19 0001      Lumbar Exercises: Aerobic   Nustep  4min L5      Lumbar Exercises: Seated   Other Seated Lumbar Exercises  seated on green swiss ball for core activation and holds, marching 3x10, and rows/march with red t-bnd 2x10      Lumbar Exercises: Supine   Bent Knee Raise  20 reps    Bent Knee Raise Limitations  marching with 4#alt ball to knee  2x10    Bridge  20 reps;3 seconds    Bridge Limitations  green t-band    Straight Leg Raise  20 reps      Lumbar Exercises: Quadruped   Opposite Arm/Leg Raise  Right arm/Left leg;Left arm/Right leg;10 reps      Shoulder Exercises: Standing   Other Standing Exercises  ext pull with blue XTS x25 with draw in    Other Standing Exercises  standing 4# reachouts and woodchops 2x10 each      Shoulder Exercises: ROM/Strengthening   UBE (Upper Arm Bike)  84min 90 RPM    Wall Pushups  20 reps                  PT Long Term Goals - 10/05/19 1025      PT LONG TERM GOAL #1   Title  I with HEP    Time  4    Period  Weeks    Status  Achieved   10/05/19     PT LONG TERM GOAL #2   Title  Patient able to reach behind back with right hand to L2.    Time  6    Period  Weeks    Status  On-going      PT LONG TERM GOAL #3   Title  Morning LBP level not > 3-4/10.    Time  6    Period  Weeks    Status  On-going   up to 8/10 10/05/19     PT LONG TERM GOAL #4   Title  --             Plan - 10/08/19 1020    Clinical Impression Statement  Patient continues to tolerat treatments well and progressing with all exercises. Patient continues to have pain that increase with infusion from his cancer treatments. Patient has decreased pain in shoulder yet ongoing limitations with movements at this time. Patient current goals are progressing this week.    Personal Factors and Comorbidities  Comorbidity 1;Comorbidity 2    Comorbidities  Chemotherapy due to lung cancer, hypothyroidism, left knee surgery, h/o neck pain, CAD.    Examination-Activity Limitations  Toileting    Examination-Participation Restrictions  Other    Stability/Clinical Decision Making  Evolving/Moderate complexity    Rehab Potential  Good    PT Frequency  2x / week    PT Duration  6 weeks    PT Treatment/Interventions  Manual techniques;Traction;Electrical Stimulation;Moist Heat;Cryotherapy;Functional mobility training;Therapeutic activities;Therapeutic exercise;Neuromuscular re-education;Passive range of motion;Taping    PT Next Visit Plan  cont with core and shoulder strengthening and STW as needed add sidly ER diagnls with T band. 10th visit progress and FOTO next treatment    Consulted and Agree with Plan of Care  Patient       Patient will benefit from skilled therapeutic intervention in order to improve the following deficits and impairments:  Pain, Decreased strength, Decreased range of motion, Decreased activity tolerance  Visit Diagnosis: Acute left-sided low back pain without sciatica  Muscle weakness (generalized)  Chronic right shoulder pain     Problem List Patient Active Problem List   Diagnosis Date Noted  . Dyslipidemia 07/14/2019  . Shoulder pain 03/31/2019  . Port-A-Cath in place 08/11/2018  . Hypothyroidism (acquired) 08/11/2018  . Metastasis to lung ([place]) 05/19/2018  . Encounter for antineoplastic chemotherapy 05/13/2018  . Encounter for antineoplastic immunotherapy  05/13/2018  . Goals of care, counseling/discussion 05/13/2018  . 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 Axillary adenopathy 04/24/2018  . Right lower lobe lung mass 04/22/2018  . Cough with hemoptysis 04/22/2018  . Wheezing 04/02/2018  . Long-term use of high-risk medication 04/02/2018  . Dizziness 06/26/2017  . Fatigue 04/30/2016  . Insomnia 04/30/2016  . Hyperlipidemia 01/31/2016  . Ear pain 05/09/2015  . Anemia   . Abnormal nuclear stress test   . OBESITY, UNSPECIFIED 03/28/2009  . ESSENTIAL HYPERTENSION, BENIGN 03/28/2009  . PALPITATIONS 03/28/2009  . SNORING 03/28/2009    Siera Beyersdorf P, PTA 10/08/2019, 10:33 AM  [facility] [place], [place], [address] Phone: [phone]   Fax:  [phone]  Name: NICHOLAI WILLETTE MRN: [id number] Date of Birth: 1946-02-11

## 2019-10-12 ENCOUNTER — Other Ambulatory Visit: Payer: Self-pay

## 2019-10-12 ENCOUNTER — Encounter: Payer: Self-pay | Admitting: Physical Therapy

## 2019-10-12 ENCOUNTER — Ambulatory Visit: Payer: Medicare Other | Attending: Orthopedic Surgery | Admitting: Physical Therapy

## 2019-10-12 DIAGNOSIS — M6281 Muscle weakness (generalized): Secondary | ICD-10-CM | POA: Insufficient documentation

## 2019-10-12 DIAGNOSIS — G8929 Other chronic pain: Secondary | ICD-10-CM | POA: Insufficient documentation

## 2019-10-12 DIAGNOSIS — M545 Low back pain, unspecified: Secondary | ICD-10-CM

## 2019-10-12 DIAGNOSIS — M25511 Pain in right shoulder: Secondary | ICD-10-CM | POA: Insufficient documentation

## 2019-10-12 NOTE — Therapy (Signed)
[facility] [place], [place], [address] Phone: [phone]   Fax:  [phone]  Physical Therapy Treatment  Patient Details  Name: Travis Padilla MRN: [id number] Date of Birth: 1946/02/18 Referring Provider (PT): [name] Plants MD.   Encounter Date: 10/12/2019  PT End of Session - 10/12/19 1023    Visit Number  10    Number of Visits  12    Date for PT Re-Evaluation  10/21/19    Authorization Type  FOTO.    PT Start Time  0945    PT Stop Time  1029    PT Time Calculation (min)  44 min    Activity Tolerance  Patient tolerated treatment well    Behavior During Therapy  WFL for tasks assessed/performed       Past Medical History:  Diagnosis Date  . Abnormal nuclear stress test    December, 2013  . Anemia   . CAD (coronary artery disease)    Mild nonobstructive plaque in cath 2013  . Chest pain    December, 2013  . Dizziness   . Gout   . Hemorrhoid   . Hyperlipidemia   . Hypertension   . Skin cancer   . Spinal stenosis     Past Surgical History:  Procedure Laterality Date  . 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 BELPHAROPTOSIS REPAIR Bilateral   . CATARACT EXTRACTION W/ INTRAOCULAR LENS  IMPLANT, BILATERAL    . COLONOSCOPY N/A 11/03/2014   Procedure: COLONOSCOPY;  Surgeon: Rogene Houston, MD;  [facility];  Service: Endoscopy;  Laterality: N/A;  1225  . IR IMAGING GUIDED PORT INSERTION  07/11/2018  . KNEE CARTILAGE SURGERY Left    Left knee  . SKIN CANCER EXCISION  12/2014, 04/25/15  . VIDEO BRONCHOSCOPY Bilateral 05/09/2018   Procedure: VIDEO BRONCHOSCOPY WITH FLUORO;  Surgeon: Juanito Doom, MD;  [facility];  Service: Cardiopulmonary;  Laterality: Bilateral;    There were no vitals filed for this visit.  Subjective Assessment - 10/12/19 0953    Subjective  COVID-19 screen performed prior to patient entering clinic.  Patient reported less discomfort today    Pertinent History   Chemotherapy due to lung cancer, hypothyroidism, left knee surgery, h/o neck pain, CAD.    Patient Stated Goals  Reduce pain.    Currently in Pain?  Yes    Pain Score  4     Pain Location  Back    Pain Orientation  Left    Pain Descriptors / Indicators  Discomfort    Pain Type  Chronic pain    Pain Onset  More than a month ago    Pain Frequency  Constant    Aggravating Factors   in the AM    Pain Relieving Factors  meds/rest                       [facility] Adult PT Treatment/Exercise - 10/12/19 0001      Lumbar Exercises: Aerobic   Nustep  51mn L5-6 UE/LE, monitored      Lumbar Exercises: Seated   Other Seated Lumbar Exercises  seated on green swiss ball for core activation and holds, marching 3x10      Lumbar Exercises: Supine   Clam  20 reps    Clam Limitations  green band    Bent Knee Raise  20 reps    Bridge  20 reps;3 seconds  Bridge Limitations  with straight legs on gren swiss ball    Straight Leg Raise  20 reps      Lumbar Exercises: Quadruped   Opposite Arm/Leg Raise  Right arm/Left leg;Left arm/Right leg;10 reps      Shoulder Exercises: Standing   Other Standing Exercises  ext pull with blue XTS x25 with draw in    Other Standing Exercises  standing 4# reachouts and woodchops 2x10 each      Shoulder Exercises: ROM/Strengthening   UBE (Upper Arm Bike)  58mn 90 RPM                  PT Long Term Goals - 10/12/19 1026      PT LONG TERM GOAL #1   Title  I with HEP    Time  4    Period  Weeks    Status  Achieved      PT LONG TERM GOAL #2   Title  Patient able to reach behind back with right hand to L2.    Time  6    Period  Weeks    Status  Achieved   AROM L1 10/12/19     PT LONG TERM GOAL #3   Title  Morning LBP level not > 3-4/10.    Time  6    Period  Weeks    Status  On-going   45-1/88on certain days 24/1/66          Plan - 10/12/19 1028    Clinical Impression Statement  Patient tolerated treatment well today.  Patient able to progres with exercises today. Patient reported less pain upon arrival and has had a good morning thus far. Patient doing well with HEP and is consistant daily. Patient able to reach behind back with greater ease. Patient FOTO 47% limitation today Patient met LTG today with soulder ROM with pain goal ongoing due to limitations.    Personal Factors and Comorbidities  Comorbidity 1;Comorbidity 2    Comorbidities  Chemotherapy due to lung cancer, hypothyroidism, left knee surgery, h/o neck pain, CAD.    Examination-Activity Limitations  Toileting    Examination-Participation Restrictions  Other    Stability/Clinical Decision Making  Evolving/Moderate complexity    Rehab Potential  Good    PT Frequency  2x / week    PT Duration  6 weeks    PT Treatment/Interventions  Manual techniques;Traction;Electrical Stimulation;Moist Heat;Cryotherapy;Functional mobility training;Therapeutic activities;Therapeutic exercise;Neuromuscular re-education;Passive range of motion;Taping    PT Next Visit Plan  cont with core and shoulder strengthening and STW as needed add sidly ER diagnls with T band/wall abd flexion with t band for shoulder exercise progression    Consulted and Agree with Plan of Care  Patient       Patient will benefit from skilled therapeutic intervention in order to improve the following deficits and impairments:  Pain, Decreased strength, Decreased range of motion, Decreased activity tolerance  Visit Diagnosis: Acute left-sided low back pain without sciatica  Muscle weakness (generalized)  Chronic right shoulder pain     Problem List Patient Active Problem List   Diagnosis Date Noted  . Dyslipidemia 07/14/2019  . Shoulder pain 03/31/2019  . Port-A-Cath in place 08/11/2018  . Hypothyroidism (acquired) 08/11/2018  . [place]asis to lung [place] 05/19/2018  . Encounter for antineoplastic chemotherapy 05/13/2018  . Encounter for antineoplastic immunotherapy 05/13/2018  .  Goals of care, counseling/discussion 05/13/2018  . 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 05/13/2018  .  Axillary adenopathy 04/24/2018  . Right lower lobe lung mass 04/22/2018  . Cough with hemoptysis 04/22/2018  . Wheezing 04/02/2018  . Long-term use of high-risk medication 04/02/2018  . Dizziness 06/26/2017  . Fatigue 04/30/2016  . Insomnia 04/30/2016  . Hyperlipidemia 01/31/2016  . Ear pain 05/09/2015  . Anemia   . Abnormal nuclear stress test   . OBESITY, UNSPECIFIED 03/28/2009  . ESSENTIAL HYPERTENSION, BENIGN 03/28/2009  . PALPITATIONS 03/28/2009  . SNORING 03/28/2009   Ladean Raya, PTA 10/12/19 10:36 AM  [facility] [place], [place], [address] Phone: [phone]   Fax:  [phone]  Name: Travis Padilla MRN: [id number] Date of Birth: June 21, 1946  Progress Note Reporting Period 09/09/19 to 10/12/19  See note below for Objective Data and Assessment of Progress/Goals. LTG #2 met.  Good progress toward goals.    Mali Applegate MPT

## 2019-10-15 ENCOUNTER — Other Ambulatory Visit: Payer: Self-pay

## 2019-10-15 ENCOUNTER — Ambulatory Visit: Payer: Medicare Other | Admitting: *Deleted

## 2019-10-15 DIAGNOSIS — M25511 Pain in right shoulder: Secondary | ICD-10-CM | POA: Diagnosis not present

## 2019-10-15 DIAGNOSIS — G8929 Other chronic pain: Secondary | ICD-10-CM

## 2019-10-15 DIAGNOSIS — M545 Low back pain, unspecified: Secondary | ICD-10-CM

## 2019-10-15 DIAGNOSIS — M6281 Muscle weakness (generalized): Secondary | ICD-10-CM | POA: Diagnosis not present

## 2019-10-15 NOTE — Therapy (Signed)
[facility] [place], [place], [address] Phone: [phone]   Fax:  [phone]  Physical Therapy Treatment  Patient Details  Name: Travis Padilla MRN: [id number] Date of Birth: April 13, 1946 Referring Provider (PT): [name] Plants MD.   Encounter Date: 10/15/2019  PT End of Session - 10/15/19 1000    Visit Number  11    Number of Visits  12    Date for PT Re-Evaluation  10/21/19    Authorization Type  FOTO.  43% limited 11th visit    PT Start Time  0945    PT Stop Time  1031    PT Time Calculation (min)  46 min       Past Medical History:  Diagnosis Date  . Abnormal nuclear stress test    December, 2013  . Anemia   . CAD (coronary artery disease)    Mild nonobstructive plaque in cath 2013  . Chest pain    December, 2013  . Dizziness   . Gout   . Hemorrhoid   . Hyperlipidemia   . Hypertension   . Skin cancer   . Spinal stenosis     Past Surgical History:  Procedure Laterality Date  . 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 BELPHAROPTOSIS REPAIR Bilateral   . CATARACT EXTRACTION W/ INTRAOCULAR LENS  IMPLANT, BILATERAL    . COLONOSCOPY N/A 11/03/2014   Procedure: COLONOSCOPY;  Surgeon: Rogene Houston, MD;  [facility];  Service: Endoscopy;  Laterality: N/A;  1225  . IR IMAGING GUIDED PORT INSERTION  07/11/2018  . KNEE CARTILAGE SURGERY Left    Left knee  . SKIN CANCER EXCISION  12/2014, 04/25/15  . VIDEO BRONCHOSCOPY Bilateral 05/09/2018   Procedure: VIDEO BRONCHOSCOPY WITH FLUORO;  Surgeon: Juanito Doom, MD;  [facility];  Service: Cardiopulmonary;  Laterality: Bilateral;    There were no vitals filed for this visit.  Subjective Assessment - 10/15/19 0959    Subjective  COVID-19 screen performed prior to patient entering clinic.  Patient doing . LBP4/10 shldr 6/10    Pertinent History  Chemotherapy due to lung cancer, hypothyroidism, left knee surgery, h/o neck pain, CAD.    Patient  Stated Goals  Reduce pain.    Currently in Pain?  Yes    Pain Score  4     Pain Location  Back                       [facility] Adult PT Treatment/Exercise - 10/15/19 0001      Lumbar Exercises: Aerobic   Nustep  21mn L5-6 UE/LE, monitored      Lumbar Exercises: Supine   Bent Knee Raise  20 reps    Dead Bug  10 reps    Bridge  20 reps;3 seconds    Other Supine Lumbar Exercises  Reviewed all HEP and sheets given. Pt independent in all Exs for LB and shldr.      Lumbar Exercises: Quadruped   Opposite Arm/Leg Raise  Right arm/Left leg;Left arm/Right leg;10 reps      Shoulder Exercises: Standing   Other Standing Exercises  ext pull with blue XTS x25 with draw in    Other Standing Exercises  --      Shoulder Exercises: ROM/Strengthening   UBE (Upper Arm Bike)  823m 90 RPM             PT Education - 10/15/19 1211  Education Details  Pt instructed in core and LB exs    Person(s) Educated  Patient    Methods  Explanation;Demonstration;Tactile cues;Verbal cues;Handout    Comprehension  Verbalized understanding;Returned demonstration          PT Long Term Goals - 10/15/19 1024      PT LONG TERM GOAL #1   Title  I with HEP    Time  4    Period  Weeks    Status  Achieved      PT LONG TERM GOAL #2   Title  Patient able to reach behind back with right hand to L2.    Time  6    Period  Weeks    Status  Achieved      PT LONG TERM GOAL #3   Title  Morning LBP level not > 3-4/10.    Time  6    Period  Weeks    Status  Achieved      PT LONG TERM GOAL #4   Title  Patient able to tolerate his normal exercise routine with 80% improvement in R shoulder.    Baseline  today 50-60% better    Period  Weeks    Status  Partially Met      PT LONG TERM GOAL #5   Title  tolerate sitting for 20 minutes with pain not >3/10    Time  4    Period  Weeks    Status  Achieved            Plan - 10/15/19 1212    Clinical Impression Statement  Pt arrived today  doing fairly well. He was guide through shldr and core/LB exs today and instructed/reviewed HEP for both. He will be DC today to HEP. He was able to meet all LTGs except RT shldr feeling 80% better (50-60% better) and was partially met. FOTO 43% limitations    Comorbidities  Chemotherapy due to lung cancer, hypothyroidism, left knee surgery, h/o neck pain, CAD.    Stability/Clinical Decision Making  Evolving/Moderate complexity    PT Frequency  2x / week    PT Duration  6 weeks    PT Treatment/Interventions  Manual techniques;Traction;Electrical Stimulation;Moist Heat;Cryotherapy;Functional mobility training;Therapeutic activities;Therapeutic exercise;Neuromuscular re-education;Passive range of motion;Taping    PT Next Visit Plan  DC to HEP       Patient will benefit from skilled therapeutic intervention in order to improve the following deficits and impairments:  Pain, Decreased strength, Decreased range of motion, Decreased activity tolerance  Visit Diagnosis: Muscle weakness (generalized)  Acute left-sided low back pain without sciatica  Chronic right shoulder pain     Problem List Patient Active Problem List   Diagnosis Date Noted  . Dyslipidemia 07/14/2019  . Shoulder pain 03/31/2019  . Port-A-Cath in place 08/11/2018  . Hypothyroidism (acquired) 08/11/2018  . Metastasis to lung ([place]) 05/19/2018  . Encounter for antineoplastic chemotherapy 05/13/2018  . Encounter for antineoplastic immunotherapy 05/13/2018  . Goals of care, counseling/discussion 05/13/2018  . 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[place]) 05/13/2018  . Axillary adenopathy 04/24/2018  . Right lower lobe lung mass 04/22/2018  . Cough with hemoptysis 04/22/2018  . Wheezing 04/02/2018  . Long-term use of high-risk medication 04/02/2018  . Dizziness 06/26/2017  . Fatigue 04/30/2016  . Insomnia 04/30/2016  . Hyperlipidemia 01/31/2016  . Ear pain 05/09/2015  . Anemia   . Abnormal nuclear stress test    . OBESITY, UNSPECIFIED 03/28/2009  . ESSENTIAL HYPERTENSION,  BENIGN 03/28/2009  . PALPITATIONS 03/28/2009  . SNORING 03/28/2009    Keiley Levey,CHRIS , PTA 10/15/2019, 12:17 PM  [facility] [address] [place], [place], [address] Phone: [phone]   Fax:  [phone]  Name: Travis Padilla MRN: [id number] Date of Birth: April 10, 1946  PHYSICAL THERAPY DISCHARGE SUMMARY  Visits from Start of Care: 11.  Current functional level related to goals / functional outcomes: See above.   Remaining deficits: See goal section.  LTG's #1, 2, 3 and 5 met.   Education / Equipment: HEP. Plan: Patient agrees to discharge.  Patient goals were partially met. Patient is being discharged due to the physician's request.  ?????         Mali Applegate MPT

## 2019-10-18 ENCOUNTER — Other Ambulatory Visit: Payer: Self-pay | Admitting: Internal Medicine

## 2019-10-18 DIAGNOSIS — C3491 Malignant neoplasm of unspecified part of right bronchus or lung: Secondary | ICD-10-CM

## 2019-10-19 ENCOUNTER — Ambulatory Visit: Payer: Medicare Other

## 2019-10-26 ENCOUNTER — Ambulatory Visit: Payer: Medicare Other | Admitting: Family Medicine

## 2019-10-27 ENCOUNTER — Inpatient Hospital Stay: Payer: Medicare Other | Attending: Oncology | Admitting: Internal Medicine

## 2019-10-27 ENCOUNTER — Inpatient Hospital Stay: Payer: Medicare Other

## 2019-10-27 ENCOUNTER — Encounter: Payer: Self-pay | Admitting: Internal Medicine

## 2019-10-27 ENCOUNTER — Other Ambulatory Visit: Payer: Self-pay

## 2019-10-27 VITALS — BP 130/81 | HR 72 | Temp 97.8°F | Resp 18 | Ht 74.0 in | Wt 242.3 lb

## 2019-10-27 DIAGNOSIS — Z95828 Presence of other vascular implants and grafts: Secondary | ICD-10-CM

## 2019-10-27 DIAGNOSIS — Z79899 Other long term (current) drug therapy: Secondary | ICD-10-CM | POA: Diagnosis not present

## 2019-10-27 DIAGNOSIS — C3490 Malignant neoplasm of unspecified part of unspecified bronchus or lung: Secondary | ICD-10-CM

## 2019-10-27 DIAGNOSIS — C3412 Malignant neoplasm of upper lobe, left bronchus or lung: Secondary | ICD-10-CM | POA: Insufficient documentation

## 2019-10-27 DIAGNOSIS — Z9221 Personal history of antineoplastic chemotherapy: Secondary | ICD-10-CM | POA: Diagnosis not present

## 2019-10-27 DIAGNOSIS — C3491 Malignant neoplasm of unspecified part of right bronchus or lung: Secondary | ICD-10-CM | POA: Diagnosis not present

## 2019-10-27 DIAGNOSIS — E039 Hypothyroidism, unspecified: Secondary | ICD-10-CM | POA: Diagnosis not present

## 2019-10-27 DIAGNOSIS — Z923 Personal history of irradiation: Secondary | ICD-10-CM | POA: Diagnosis not present

## 2019-10-27 DIAGNOSIS — C773 Secondary and unspecified malignant neoplasm of axilla and upper limb lymph nodes: Secondary | ICD-10-CM | POA: Insufficient documentation

## 2019-10-27 DIAGNOSIS — Z5112 Encounter for antineoplastic immunotherapy: Secondary | ICD-10-CM | POA: Diagnosis not present

## 2019-10-27 DIAGNOSIS — E785 Hyperlipidemia, unspecified: Secondary | ICD-10-CM | POA: Insufficient documentation

## 2019-10-27 DIAGNOSIS — I10 Essential (primary) hypertension: Secondary | ICD-10-CM | POA: Diagnosis not present

## 2019-10-27 DIAGNOSIS — I2510 Atherosclerotic heart disease of native coronary artery without angina pectoris: Secondary | ICD-10-CM | POA: Diagnosis not present

## 2019-10-27 DIAGNOSIS — Z85828 Personal history of other malignant neoplasm of skin: Secondary | ICD-10-CM | POA: Insufficient documentation

## 2019-10-27 DIAGNOSIS — M545 Low back pain: Secondary | ICD-10-CM | POA: Insufficient documentation

## 2019-10-27 DIAGNOSIS — C7801 Secondary malignant neoplasm of right lung: Secondary | ICD-10-CM

## 2019-10-27 LAB — CBC WITH DIFFERENTIAL (CANCER CENTER ONLY)
Abs Immature Granulocytes: 0.03 10*3/uL (ref 0.00–0.07)
Basophils Absolute: 0 10*3/uL (ref 0.0–0.1)
Basophils Relative: 1 %
Eosinophils Absolute: 0.4 10*3/uL (ref 0.0–0.5)
Eosinophils Relative: 6 %
HCT: 40.2 % (ref 39.0–52.0)
Hemoglobin: 13.6 g/dL (ref 13.0–17.0)
Immature Granulocytes: 1 %
Lymphocytes Relative: 20 %
Lymphs Abs: 1.2 10*3/uL (ref 0.7–4.0)
MCH: 30.7 pg (ref 26.0–34.0)
MCHC: 33.8 g/dL (ref 30.0–36.0)
MCV: 90.7 fL (ref 80.0–100.0)
Monocytes Absolute: 0.9 10*3/uL (ref 0.1–1.0)
Monocytes Relative: 15 %
Neutro Abs: 3.4 10*3/uL (ref 1.7–7.7)
Neutrophils Relative %: 57 %
Platelet Count: 161 10*3/uL (ref 150–400)
RBC: 4.43 MIL/uL (ref 4.22–5.81)
RDW: 11.9 % (ref 11.5–15.5)
WBC Count: 6 10*3/uL (ref 4.0–10.5)
nRBC: 0 % (ref 0.0–0.2)

## 2019-10-27 LAB — TSH: TSH: 2.524 u[IU]/mL (ref 0.320–4.118)

## 2019-10-27 LAB — CMP (CANCER CENTER ONLY)
ALT: 20 U/L (ref 0–44)
AST: 18 U/L (ref 15–41)
Albumin: 4.2 g/dL (ref 3.5–5.0)
Alkaline Phosphatase: 60 U/L (ref 38–126)
Anion gap: 6 (ref 5–15)
BUN: 15 mg/dL (ref 8–23)
CO2: 27 mmol/L (ref 22–32)
Calcium: 9.4 mg/dL (ref 8.9–10.3)
Chloride: 108 mmol/L (ref 98–111)
Creatinine: 1.02 mg/dL (ref 0.61–1.24)
GFR, Est AFR Am: >60 mL/min (ref >60)
GFR, Estimated: >60 mL/min (ref >60)
Glucose, Bld: 110 mg/dL — ABNORMAL HIGH (ref 70–99)
Potassium: 4.9 mmol/L (ref 3.5–5.1)
Sodium: 141 mmol/L (ref 135–145)
Total Bilirubin: 0.4 mg/dL (ref 0.3–1.2)
Total Protein: 8 g/dL (ref 6.5–8.1)

## 2019-10-27 MED ORDER — SODIUM CHLORIDE 0.9 % IV SOLN
Freq: Once | INTRAVENOUS | Status: AC
  Filled 2019-10-27: qty 250

## 2019-10-27 MED ORDER — SODIUM CHLORIDE 0.9% FLUSH
10.0000 mL | INTRAVENOUS | Status: DC | PRN
  Administered 2019-10-27: 10 mL
  Filled 2019-10-27: qty 10

## 2019-10-27 MED ORDER — SODIUM CHLORIDE 0.9 % IV SOLN
200.0000 mg | Freq: Once | INTRAVENOUS | Status: AC
  Administered 2019-10-27: 200 mg via INTRAVENOUS
  Filled 2019-10-27: qty 8

## 2019-10-27 MED ORDER — HEPARIN SOD (PORK) LOCK FLUSH 100 UNIT/ML IV SOLN
500.0000 [IU] | Freq: Once | INTRAVENOUS | Status: AC | PRN
  Administered 2019-10-27: 500 [IU]
  Filled 2019-10-27: qty 5

## 2019-10-27 MED ORDER — SODIUM CHLORIDE 0.9% FLUSH
10.0000 mL | Freq: Once | INTRAVENOUS | Status: AC
  Administered 2019-10-27: 10 mL
  Filled 2019-10-27: qty 10

## 2019-10-27 MED ORDER — ALTEPLASE 2 MG IJ SOLR
2.0000 mg | Freq: Once | INTRAMUSCULAR | Status: AC
  Administered 2019-10-27: 2 mg
  Filled 2019-10-27: qty 2

## 2019-10-27 MED ORDER — ALTEPLASE 2 MG IJ SOLR
INTRAMUSCULAR | Status: AC
  Filled 2019-10-27: qty 2

## 2019-10-27 NOTE — Patient Instructions (Signed)
[facility] Discharge Instructions for Patients Receiving Chemotherapy/Immunotherapy  Today you received the following chemotherapy/immunotherapy agents :  Pembrolizumab.  To help prevent nausea and vomiting after your treatment, we encourage you to take your nausea medication as prescribed.   If you develop nausea and vomiting that is not controlled by your nausea medication, call the clinic.   BELOW ARE SYMPTOMS THAT SHOULD BE REPORTED IMMEDIATELY:  *FEVER GREATER THAN 100.5 F  *CHILLS WITH OR WITHOUT FEVER  NAUSEA AND VOMITING THAT IS NOT CONTROLLED WITH YOUR NAUSEA MEDICATION  *UNUSUAL SHORTNESS OF BREATH  *UNUSUAL BRUISING OR BLEEDING  TENDERNESS IN MOUTH AND THROAT WITH OR WITHOUT PRESENCE OF ULCERS  *URINARY PROBLEMS  *BOWEL PROBLEMS  UNUSUAL RASH Items with * indicate a potential emergency and should be followed up as soon as possible.  Feel free to call the clinic should you have any questions or concerns. The clinic phone number is [phone].  Please show the [name] at check-in to the Emergency Department and triage nurse.

## 2019-10-27 NOTE — Progress Notes (Signed)
[place] [phone]   [phone]  OFFICE PROGRESS NOTE  Travis Norlander, DO Travis Padilla [id number]  DIAGNOSIS: Stage IV (T3, N0, M1c) non-small cell lung cancer, squamous cell carcinoma presented with large right infrahilar mass in addition to left upper lobe lung nodule as well as left axillary mass with left axillary lymph node diagnosed in August 2019.  PRIOR THERAPY:  1) Palliative radiotherapy to the right infrahilar mass as well as the axillary mass under the care of Dr. Lisbeth Renshaw. 2) Systemic chemotherapy with carboplatin for AUC of 5, paclitaxel 175 mg/M2 and Keytruda 200 mg IV every 3 weeks status post 4 cycles.  CURRENT THERAPY: Maintenance immunotherapy with single agent Keytruda 200 mg IV every 3 weeks status post 22 cycles.  INTERVAL HISTORY: Travis Padilla 74 y.o. male returns to the clinic today for follow-up visit.  The patient is feeling fine today with no concerning complaints except for the persistent low back pain and he has been undergoing physical therapy.  He denied having any current chest pain, shortness of breath, cough or hemoptysis.  He denied having any fever or chills.  He has no nausea, vomiting, diarrhea or constipation.  He denied having any headache or visual changes.  He is here today for evaluation before starting cycle #23 of his maintenance therapy.   MEDICAL HISTORY: Past Medical History:  Diagnosis Date  . Abnormal nuclear stress test    December, 2013  . Anemia   . CAD (coronary artery disease)    Mild nonobstructive plaque in cath 2013  . Chest pain    December, 2013  . Dizziness   . Gout   . Hemorrhoid   . Hyperlipidemia   . Hypertension   . Skin cancer   . Spinal stenosis     ALLERGIES:  is allergic to trazodone and nefazodone.  MEDICATIONS:  Current Outpatient Medications  Medication Sig Dispense Refill  . albuterol (PROVENTIL) (2.5 MG/3ML) 0.083% nebulizer solution Take 3  mLs (2.5 mg total) by nebulization every 6 (six) hours as needed for wheezing or shortness of breath. (Patient not taking: Reported on 08/25/2019) 75 mL 12  . ALPRAZolam (XANAX) 0.25 MG tablet Take 1 tablet (0.25 mg total) by mouth at bedtime as needed for anxiety. 15 tablet 0  . Apple Cider Vinegar 500 MG TABS Take by mouth.    . Artificial Tear Solution (SOOTHE XP OP) Place 1 drop into both eyes 2 (two) times daily.    . carvedilol (COREG) 12.5 MG tablet Take 1 tablet (12.5 mg total) by mouth 2 (two) times daily with a meal. 180 tablet 3  . Cyanocobalamin (VITAMIN B 12) 250 MCG LOZG Take by mouth.    . diphenhydramine-acetaminophen (TYLENOL PM) 25-500 MG TABS tablet Take 1-2 tablets by mouth at bedtime as needed (pain).    . ferrous sulfate 324 MG TBEC Take 324 mg by mouth daily with breakfast.    . levothyroxine (SYNTHROID) 150 MCG tablet TAKE 1 TABLET BY MOUTH EVERY DAY BEFORE BREAKFAST 30 tablet 0  . lidocaine-prilocaine (EMLA) cream Apply 1 application topically as needed. 30 g 1  . methocarbamol (ROBAXIN) 500 MG tablet Take 500 mg by mouth daily.    . mirtazapine (REMERON) 15 MG tablet TAKE 1 TABLET BY MOUTH EVERYDAY AT BEDTIME 90 tablet 1  . Multiple Vitamins-Minerals (PRESERVISION AREDS 2 PO) Take by mouth.    . oxyCODONE-acetaminophen (PERCOCET/ROXICET) 5-325 MG tablet Take 1 tablet  by mouth every 8 (eight) hours as needed for severe pain. 30 tablet 0  . pravastatin (PRAVACHOL) 40 MG tablet Take 1 tablet (40 mg total) by mouth every evening. 90 tablet 3  . prochlorperazine (COMPAZINE) 10 MG tablet Take 1 tablet (10 mg total) by mouth every 6 (six) hours as needed for nausea or vomiting. 30 tablet 1  . senna-docusate (SENNA S) 8.6-50 MG tablet 1 to 2 twice daily for constipation 120 tablet 5   No current facility-administered medications for this visit.    SURGICAL HISTORY:  Past Surgical History:  Procedure Laterality Date  . 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 BELPHAROPTOSIS  REPAIR Bilateral   . CATARACT EXTRACTION W/ INTRAOCULAR LENS  IMPLANT, BILATERAL    . COLONOSCOPY N/A 11/03/2014   Procedure: COLONOSCOPY;  Surgeon: Rogene Houston, MD;  Location: [facility];  Service: Endoscopy;  Laterality: N/A;  1225  . IR IMAGING GUIDED PORT INSERTION  07/11/2018  . KNEE CARTILAGE SURGERY Left    Left knee  . SKIN CANCER EXCISION  12/2014, 04/25/15  . VIDEO BRONCHOSCOPY Bilateral 05/09/2018   Procedure: VIDEO BRONCHOSCOPY WITH FLUORO;  Surgeon: Juanito Doom, MD;  Location: [facility];  Service: Cardiopulmonary;  Laterality: Bilateral;    REVIEW OF SYSTEMS:  A comprehensive review of systems was negative except for: Musculoskeletal: positive for back pain   PHYSICAL EXAMINATION: General appearance: alert, cooperative and no distress Head: Normocephalic, without obvious abnormality, atraumatic Neck: no adenopathy, no JVD, supple, symmetrical, trachea midline and thyroid not enlarged, symmetric, no tenderness/mass/nodules Lymph nodes: Cervical, supraclavicular, and axillary nodes normal. Resp: clear to auscultation bilaterally Back: symmetric, no curvature. ROM normal. No CVA tenderness. Cardio: regular rate and rhythm, S1, S2 normal, no murmur, click, rub or gallop GI: soft, non-tender; bowel sounds normal; no masses,  no organomegaly Extremities: extremities normal, atraumatic, no cyanosis or edema  ECOG PERFORMANCE STATUS: 1 - Symptomatic but completely ambulatory  Blood pressure 130/81, pulse 72, temperature 97.8 F (36.6 C), temperature source Temporal, resp. rate 18, height 6\' 2"  (1.88 m), weight 242 lb 4.8 oz (109.9 kg), SpO2 100 %.  LABORATORY DATA: Lab Results  Component Value Date   WBC 6.0 10/27/2019   HGB 13.6 10/27/2019   HCT 40.2 10/27/2019   MCV 90.7 10/27/2019   PLT 161 10/27/2019      Chemistry      Component Value Date/Time   NA 139 10/06/2019 1115   NA 144 04/09/2018 1507   K 4.5 10/06/2019 1115   CL 107 10/06/2019 1115   CO2  24 10/06/2019 1115   BUN 16 10/06/2019 1115   BUN 15 04/09/2018 1507   CREATININE 0.95 10/06/2019 1115      Component Value Date/Time   CALCIUM 9.2 10/06/2019 1115   ALKPHOS 55 10/06/2019 1115   AST 17 10/06/2019 1115   ALT 12 10/06/2019 1115   BILITOT 0.6 10/06/2019 1115       RADIOGRAPHIC STUDIES: No results found.  ASSESSMENT AND PLAN: This is a very pleasant 74 years old white male with very light most smoking history recently diagnosed with stage IV (T3, N0, M1c) non-small cell lung cancer, squamous cell carcinoma based on the biopsy from the left axillary mass. He status post 4 cycles of induction systemic chemotherapy with carboplatin, paclitaxel and Keytruda with partial response.  The patient is currently on maintenance treatment with single agent Keytruda status post 22 cycles. The patient has been tolerating this treatment well with no  concerning adverse effects. I recommended for him to proceed with cycle #23 today as planned. I will see him back for follow-up visit in 3 weeks for evaluation with repeat CT scan of the chest, abdomen pelvis for restaging of his disease. For the hypothyroidism, he is currently on levothyroxine 150 mcg p.o. daily.  We will continue to monitor his TSH closely and adjust his dose as needed. The patient was advised to call immediately if he has any concerning symptoms in the interval. The patient voices understanding of current disease status and treatment options and is in agreement with the current care plan. All questions were answered. The patient knows to call the clinic with any problems, questions or concerns. We can certainly see the patient much sooner if necessary.  Disclaimer: This note was dictated with voice recognition software. Similar sounding words can inadvertently be transcribed and may not be corrected upon review.

## 2019-11-03 ENCOUNTER — Ambulatory Visit (INDEPENDENT_AMBULATORY_CARE_PROVIDER_SITE_OTHER): Payer: Medicare Other | Admitting: Family Medicine

## 2019-11-03 ENCOUNTER — Encounter: Payer: Self-pay | Admitting: Family Medicine

## 2019-11-03 ENCOUNTER — Other Ambulatory Visit: Payer: Self-pay

## 2019-11-03 VITALS — BP 131/86 | HR 92 | Temp 97.8°F | Ht 74.0 in | Wt 242.0 lb

## 2019-11-03 DIAGNOSIS — E039 Hypothyroidism, unspecified: Secondary | ICD-10-CM

## 2019-11-03 DIAGNOSIS — C3491 Malignant neoplasm of unspecified part of right bronchus or lung: Secondary | ICD-10-CM

## 2019-11-03 DIAGNOSIS — Z7689 Persons encountering health services in other specified circumstances: Secondary | ICD-10-CM

## 2019-11-03 DIAGNOSIS — F5101 Primary insomnia: Secondary | ICD-10-CM

## 2019-11-03 MED ORDER — BELSOMRA 10 MG PO TABS: 10.0000 mg | ORAL_TABLET | Freq: Every day | ORAL | 1 refills | Status: DC

## 2019-11-03 NOTE — Progress Notes (Signed)
Subjective: CC: Establish care, stage IV lung cancer, insomnia PCP: [name], DO YQM:VHQI Jerilynn Mages Travis Padilla is a 74 y.o. male presenting to clinic today for:  1.  Stage IV lung cancer Patient is managed by oncology, Dr. Earlie Server.  He sees the oncologist every 3 weeks for labs and Keytruda.  He is status post treatment with both chemotherapy and radiation.  Typically he has quite a bit of low back pain after Keytruda infusions.  He is managed by orthopedics for this and uses an opioid and muscle relaxer as needed severe pain.  While this of course is not curative it certainly is somewhat helpful.  He tries to stay physically active and plans on going back to the gym soon.  He does admit to being a little bit more sedentary during the winter months.  He has gotten 1 out of 2 Covid vaccinations thus far.  2.  Insomnia Patient has quite a bit of difficulty managing sleep.  He has tried melatonin and Tylenol PM with various degrees of help.  He has never been on a prescription for sleep but would certainly welcome better rest and be open to trying something else.   ROS: Per HPI  Allergies  Allergen Reactions  . Trazodone And Nefazodone Palpitations   Past Medical History:  Diagnosis Date  . Abnormal nuclear stress test    December, 2013  . Anemia   . CAD (coronary artery disease)    Mild nonobstructive plaque in cath 2013  . Chest pain    December, 2013  . Dizziness   . Gout   . Hemorrhoid   . Hyperlipidemia   . Hypertension   . Skin cancer   . Spinal stenosis     Current Outpatient Medications:  .  ALPRAZolam (XANAX) 0.25 MG tablet, Take 1 tablet (0.25 mg total) by mouth at bedtime as needed for anxiety., Disp: 15 tablet, Rfl: 0 .  Apple Cider Vinegar 500 MG TABS, Take by mouth., Disp: , Rfl:  .  Artificial Tear Solution (SOOTHE XP OP), Place 1 drop into both eyes 2 (two) times daily., Disp: , Rfl:  .  carvedilol (COREG) 12.5 MG tablet, Take 1 tablet (12.5 mg total) by mouth  2 (two) times daily with a meal., Disp: 180 tablet, Rfl: 3 .  Cyanocobalamin (VITAMIN B 12) 250 MCG LOZG, Take by mouth., Disp: , Rfl:  .  diphenhydramine-acetaminophen (TYLENOL PM) 25-500 MG TABS tablet, Take 1-2 tablets by mouth at bedtime as needed (pain)., Disp: , Rfl:  .  ferrous sulfate 324 MG TBEC, Take 324 mg by mouth daily with breakfast., Disp: , Rfl:  .  levothyroxine (SYNTHROID) 150 MCG tablet, TAKE 1 TABLET BY MOUTH EVERY DAY BEFORE BREAKFAST, Disp: 30 tablet, Rfl: 0 .  lidocaine-prilocaine (EMLA) cream, Apply 1 application topically as needed., Disp: 30 g, Rfl: 1 .  methocarbamol (ROBAXIN) 500 MG tablet, Take 500 mg by mouth daily., Disp: , Rfl:  .  Multiple Vitamins-Minerals (PRESERVISION AREDS 2 PO), Take by mouth., Disp: , Rfl:  .  oxyCODONE-acetaminophen (PERCOCET/ROXICET) 5-325 MG tablet, Take 1 tablet by mouth every 8 (eight) hours as needed for severe pain., Disp: 30 tablet, Rfl: 0 .  pravastatin (PRAVACHOL) 40 MG tablet, Take 1 tablet (40 mg total) by mouth every evening., Disp: 90 tablet, Rfl: 3 .  senna-docusate (SENNA S) 8.6-50 MG tablet, 1 to 2 twice daily for constipation, Disp: 120 tablet, Rfl: 5 .  albuterol (PROVENTIL) (2.5 MG/3ML) 0.083% nebulizer solution, Take 3  mLs (2.5 mg total) by nebulization every 6 (six) hours as needed for wheezing or shortness of breath. (Patient not taking: Reported on 11/03/2019), Disp: 75 mL, Rfl: 12 .  mirtazapine (REMERON) 15 MG tablet, TAKE 1 TABLET BY MOUTH EVERYDAY AT BEDTIME (Patient not taking: Reported on 11/03/2019), Disp: 90 tablet, Rfl: 1 Social History   Socioeconomic History  . Marital status: Married    Spouse name: Not on file  . Number of children: 1  . Years of education: Not on file  . Highest education level: Not on file  Occupational History  . Occupation: Government social research officer  Tobacco Use  . Smoking status: Former Smoker    Packs/day: 0.20    Years: 2.00    Pack years: 0.40    Types: Cigarettes    Start date:  10/01/1968  . Smokeless tobacco: Never Used  Substance and Sexual Activity  . Alcohol use: No    Comment: hx of   . Drug use: No  . Sexual activity: Not on file  Other Topics Concern  . Not on file  Social History Narrative   Unable to ask intimate partner violence questions, wife present   Social Determinants of Health   Financial Resource Strain:   . Difficulty of Paying Living Expenses: Not on file  Food Insecurity:   . Worried About Charity fundraiser in the Last Year: Not on file  . Ran Out of Food in the Last Year: Not on file  Transportation Needs:   . Lack of Transportation (Medical): Not on file  . Lack of Transportation (Non-Medical): Not on file  Physical Activity:   . Days of Exercise per Week: Not on file  . Minutes of Exercise per Session: Not on file  Stress:   . Feeling of Stress : Not on file  Social Connections:   . Frequency of Communication with Friends and Family: Not on file  . Frequency of Social Gatherings with Friends and Family: Not on file  . Attends Religious Services: Not on file  . Active Member of Clubs or Organizations: Not on file  . Attends Archivist Meetings: Not on file  . Marital Status: Not on file  Intimate Partner Violence:   . Fear of Current or Ex-Partner: Not on file  . Emotionally Abused: Not on file  . Physically Abused: Not on file  . Sexually Abused: Not on file   Family History  Problem Relation Age of Onset  . Heart attack Father   . Heart failure Mother   . Parkinson's disease Mother   . Healthy Sister   . Skin cancer Sister   . Neuropathy Brother   . COPD Brother   . Epilepsy Brother   . Healthy Sister   . Healthy Sister   . Colon cancer Neg Hx     Objective: Office vital signs reviewed. BP 131/86   Pulse 92   Temp 97.8 F (36.6 C) (Temporal)   Ht 6\' 2"  (1.88 m)   Wt 242 lb (109.8 kg)   SpO2 96%   BMI 31.07 kg/m   Physical Examination:  General: Awake, alert, No acute distress HEENT:  Normal; no exophthalmos.  No goiter Cardio: regular rate and rhythm, S1S2 heard, no murmurs appreciated Pulm: Mild rhonchi noted in the left upper lung and a mild expiratory wheeze noted in the right middle lung and upper lung fields.  He has normal work of breathing on room air. Extremities: warm, well perfused, No edema, cyanosis or  clubbing; +2 pulses bilaterally MSK: Stiff gait and station Psych: Mood stable, speech normal, affect appropriate, pleasant and interactive Depression screen [facility] 2/9 11/03/2019 05/15/2018 04/17/2018  Decreased Interest 0 0 0  Down, Depressed, Hopeless 0 1 0  PHQ - 2 Score 0 1 0  Altered sleeping 3 - -  Tired, decreased energy 2 - -  Change in appetite 0 - -  Feeling bad or failure about yourself  0 - -  Trouble concentrating 0 - -  Moving slowly or fidgety/restless 0 - -  Suicidal thoughts 0 - -  PHQ-9 Score 5 - -  Difficult doing work/chores Not difficult at all - -  Some recent data might be hidden   Assessment/ Plan: 74 y.o. male   1. Primary insomnia Trial of Belsomra.  We discussed that there is a possibility that insurance would not cover and we may need to consider alternatives.  Would consider trazodone or even Ambien for this patient.  That we did discuss the risks of Ambien, particularly in combination with his opioid.  I think that Belsomra is the safest option and we will try to pursue this. The Narcotic Database has been reviewed.  There were no red flags.   - Suvorexant (BELSOMRA) 10 MG TABS; Take 10 mg by mouth at bedtime. (take 30 minutes prior to bedtime)  Dispense: 30 tablet; Refill: 1  2. 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[place]) Managed by oncology, currently under going [place]  3. Establishing care with new doctor, encounter for I reviewed his chart and recommendations.  4. Hypothyroidism (acquired) Currently being managed by his oncologist though I would be glad to take over this if they desire.  I agree that the blood  work being obtained during patient's infusions is probably the most convenient way to keep a check on this.   No orders of the defined types were placed in this encounter.  No orders of the defined types were placed in this encounter.   [name], DO [place] [phone]

## 2019-11-03 NOTE — Patient Instructions (Signed)
We have started Belsomra for sleep.  Take 30 minutes prior to bedtime.  If after 1 month, you have not noticed a large improvement in sleep, may increase to 15mg  (1.5 tablets) at bedtime.  Then follow up with me so I may adjust your dose.  Suvorexant oral tablets What is this medicine? SUVOREXANT (su-vor-EX-ant) is used to treat insomnia. This medicine helps you to fall asleep and sleep through the night. This medicine may be used for other purposes; ask your health care provider or pharmacist if you have questions. COMMON BRAND NAME(S): Belsomra What should I tell my health care provider before I take this medicine? They need to know if you have any of these conditions:  depression  drink alcohol  drug abuse or addiction  feel sleepy or have fallen asleep suddenly during the day  history of a sudden onset of muscle weakness (cataplexy)  liver disease  lung or breathing disease, like asthma or emphysema  sleep apnea  suicidal thoughts, plans, or attempt; a previous suicide attempt by you or a family member  an unusual or allergic reaction to suvorexant, other medicines, foods, dyes, or preservatives  pregnant or trying to get pregnant  breast-feeding How should I use this medicine? Take this medicine by mouth within 30 minutes of going to bed. Do not take it unless you are able to stay in bed a full night before you must be active again. Follow the directions on the prescription label. You may take this medicine with or without a food. However, this medicine may take longer to work if you take it with or right after meals. Do not take your medicine more often than directed. Do not stop taking this medicine on your own. Always follow your doctor or health care professional's advice. A special MedGuide will be given to you by the pharmacist with each prescription and refill. Be sure to read this information carefully each time. Talk to your pediatrician regarding the use of this  medicine in children. Special care may be needed. Overdosage: If you think you have taken too much of this medicine contact a poison control center or emergency room at once. NOTE: This medicine is only for you. Do not share this medicine with others. What if I miss a dose? This medicine should only be taken immediately before going to sleep. Do not take double or extra doses. What may interact with this medicine?  alcohol  antihistamines for allergy, cough, or cold  aprepitant  boceprevir  certain antibiotics like ciprofloxacin, clarithromycin, erythromycin, telithromycin  certain antivirals for HIV or AIDS  certain medicines for anxiety or sleep  certain medicines for depression like amitriptyline, fluoxetine, nefazodone, sertraline  certain medicines for fungal infections like ketoconazole, posaconazole, fluconazole, itraconazole  certain medicines for seizures like carbamazepine, phenobarbital, primidone, phenytoin  conivaptan  digoxin  diltiazem  general anesthetics like halothane, isoflurane, methoxyflurane, propofol  grapefruit juice  imatinib  medicines that relax muscles for surgery  narcotic medicines for pain  phenothiazines like chlorpromazine, mesoridazine, prochlorperazine, thioridazine  rifampin  verapamil This list may not describe all possible interactions. Give your health care provider a list of all the medicines, herbs, non-prescription drugs, or dietary supplements you use. Also tell them if you smoke, drink alcohol, or use illegal drugs. Some items may interact with your medicine. What should I watch for while using this medicine? Visit your health care professional for regular checks on your progress. Tell your health care professional if your symptoms do not start  to get better or if they get worse. Avoid caffeine-containing drinks in the evening hours. After taking this medicine, you may get up out of bed and do an activity that you do not  know you are doing. The next morning, you may have no memory of this. Activities include driving a car ("sleep-driving"), making and eating food, talking on the phone, sexual activity, and sleep-walking. Serious injuries have occurred. Call your doctor right away if you find out you have done any of these activities. Do not take this medicine if you have used alcohol that evening. Do not take it if you have taken another medicine for sleep. Do not take this medicine unless you are able to stay in bed for a full night (7 to 8 hours) and do not drive or perform other activities requiring full alertness within 8 hours of a dose. Do not drive, use machinery, or do anything that needs mental alertness the day after you take the 20 mg dose of this medicine. The use of lower doses (10 mg) may also cause driving impairment the next day. You may have a decrease in mental alertness the day after use, even if you feel that you are fully awake. Tell your doctor if you will need to perform activities requiring full alertness, such as driving, the next day. Do not stand or sit up quickly after taking this medicine, especially if you are an older patient. This reduces the risk of dizzy or fainting spells. If you or your family notice any changes in your behavior, such as new or worsening depression, thoughts of harming yourself, anxiety, other unusual or disturbing thoughts, or memory loss, call your health care professional right away. After you stop taking this medicine, you may have trouble falling asleep. This is called rebound insomnia. This problem usually goes away on its own after 1 or 2 nights. What side effects may I notice from receiving this medicine? Side effects that you should report to your doctor or health care professional as soon as possible:  allergic reactions like skin rash, itching or hives, swelling of the face, lips, or tongue  hallucinations  periods of leg weakness lasting from seconds to a  few minutes  suicidal thoughts, mood changes  unable to move or speak for several minutes while going to sleep or waking up  unusual activities while not fully awake like driving, eating, making phone calls, or sexual activity Side effects that usually do not require medical attention (report these to your doctor or health care professional if they continue or are bothersome):  daytime drowsiness  headache  nightmares or abnormal dreams  tiredness This list may not describe all possible side effects. Call your doctor for medical advice about side effects. You may report side effects to FDA at 1-800-FDA-1088. Where should I keep my medicine? Keep out of the reach of children. This medicine can be abused. Keep your medicine in a safe place to protect it from theft. Do not share this medicine with anyone. Selling or giving away this medicine is dangerous and against the law. Store at room temperature between 15 and 30 degrees C (59 and 86 degrees F). Throw away any unused medicine after the expiration date. NOTE: This sheet is a summary. It may not cover all possible information. If you have questions about this medicine, talk to your doctor, pharmacist, or health care provider.  2020 Elsevier/Gold Standard (2018-09-12 16:37:12)

## 2019-11-04 ENCOUNTER — Encounter: Payer: Self-pay | Admitting: Family Medicine

## 2019-11-10 ENCOUNTER — Other Ambulatory Visit: Payer: Self-pay | Admitting: Family Medicine

## 2019-11-10 MED ORDER — TRAZODONE HCL 50 MG PO TABS: 25.0000 mg | ORAL_TABLET | Freq: Every evening | ORAL | 3 refills | Status: DC | PRN

## 2019-11-12 ENCOUNTER — Other Ambulatory Visit: Payer: Self-pay | Admitting: Internal Medicine

## 2019-11-12 DIAGNOSIS — C3491 Malignant neoplasm of unspecified part of right bronchus or lung: Secondary | ICD-10-CM

## 2019-11-16 ENCOUNTER — Other Ambulatory Visit: Payer: Self-pay

## 2019-11-16 ENCOUNTER — Encounter (HOSPITAL_COMMUNITY): Payer: Self-pay

## 2019-11-16 ENCOUNTER — Ambulatory Visit (HOSPITAL_COMMUNITY)
Admission: RE | Admit: 2019-11-16 | Discharge: 2019-11-16 | Disposition: A | Discharge status: Home / Self Care | Payer: Medicare Other | Source: Ambulatory Visit | Attending: Internal Medicine | Admitting: Internal Medicine

## 2019-11-16 DIAGNOSIS — C3490 Malignant neoplasm of unspecified part of unspecified bronchus or lung: Secondary | ICD-10-CM

## 2019-11-16 DIAGNOSIS — K5730 Diverticulosis of large intestine without perforation or abscess without bleeding: Secondary | ICD-10-CM | POA: Diagnosis not present

## 2019-11-16 HISTORY — DX: Malignant neoplasm of unspecified part of unspecified bronchus or lung: C34.90

## 2019-11-16 MED ORDER — SODIUM CHLORIDE (PF) 0.9 % IJ SOLN
INTRAMUSCULAR | Status: AC
  Filled 2019-11-16: qty 50

## 2019-11-16 MED ORDER — IOHEXOL 300 MG/ML  SOLN
100.0000 mL | Freq: Once | INTRAMUSCULAR | Status: AC | PRN
  Administered 2019-11-16: 100 mL via INTRAVENOUS

## 2019-11-16 NOTE — Progress Notes (Signed)
[facility] OFFICE PROGRESS NOTE  Travis Norlander, DO [place] [address]  DIAGNOSIS: Stage IV (T3, N0, M1c) non-small cell lung cancer, squamous cell carcinoma presented with large right infrahilar mass in addition to left upper lobe lung nodule as well as left axillary mass with left axillary lymph node diagnosed in August 2019.  PRIOR THERAPY: 1) Palliative radiotherapy to the right infrahilar mass as well as the axillary mass under the care of Dr. Lisbeth Renshaw. 2) Systemic chemotherapy with carboplatin for AUC of 5, paclitaxel 175 mg/M2 and Keytruda 200 mg IV every 3 weeks status post 4 cycles.  CURRENT THERAPY:  Maintenance immunotherapy with single agent Keytruda 200 mg IV every 3 weeks status post 23cycles.  INTERVAL HISTORY: Travis Padilla 74 y.o. male returns to clinic today for a follow-up visit.  The patient is feeling well today without any concerning complaints except for continued low back pain for which he recently completed physical therapy. He states his pain is worse in the AM. He takes tylenol daily. He tries to perform his exercises daily as instructed by PT. He describes his discomfort as "crushing". No leg weakness or changes with his bowels/bladder habits or incontinence. Fortunately, He does not have any osseous metastatic lesions.   He continues to tolerate his treatment fairly well without any concerning adverse side effects except for some generalized itching without rash. He tries to use lotion without much relief.  He denies any fever, chills, night sweats, or weight loss.  He denies any chest pain, shortness of breath, cough, or hemoptysis.  He denies any nausea or vomiting.  He occasionally experiences both diarrhea and constipation. He denies any headache or visual changes.  The patient recently had a restaging CT scan performed.  The patient is here today for evaluation and to review his CT scan before starting cycle #24.    MEDICAL  HISTORY: Past Medical History:  Diagnosis Date  . Abnormal nuclear stress test    December, 2013  . Anemia   . CAD (coronary artery disease)    Mild nonobstructive plaque in cath 2013  . Chest pain    December, 2013  . Dizziness   . Gout   . Hemorrhoid   . Hyperlipidemia   . Hypertension   . 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 Skin cancer   . Spinal stenosis     ALLERGIES:  is allergic to trazodone and nefazodone.  MEDICATIONS:  Current Outpatient Medications  Medication Sig Dispense Refill  . albuterol (PROVENTIL) (2.5 MG/3ML) 0.083% nebulizer solution Take 3 mLs (2.5 mg total) by nebulization every 6 (six) hours as needed for wheezing or shortness of breath. (Patient not taking: Reported on 11/03/2019) 75 mL 12  . ALPRAZolam (XANAX) 0.25 MG tablet Take 1 tablet (0.25 mg total) by mouth at bedtime as needed for anxiety. 15 tablet 0  . Apple Cider Vinegar 500 MG TABS Take by mouth.    . Artificial Tear Solution (SOOTHE XP OP) Place 1 drop into both eyes 2 (two) times daily.    . carvedilol (COREG) 12.5 MG tablet Take 1 tablet (12.5 mg total) by mouth 2 (two) times daily with a meal. 180 tablet 3  . Cyanocobalamin (VITAMIN B 12) 250 MCG LOZG Take by mouth.    . diphenhydramine-acetaminophen (TYLENOL PM) 25-500 MG TABS tablet Take 1-2 tablets by mouth at  bedtime as needed (pain).    . ferrous sulfate 324 MG TBEC Take 324 mg by mouth daily with breakfast.    . levothyroxine (SYNTHROID) 150 MCG tablet TAKE 1 TABLET BY MOUTH EVERY DAY BEFORE BREAKFAST 30 tablet 0  . lidocaine-prilocaine (EMLA) cream Apply 1 application topically as needed. 30 g 1  . methocarbamol (ROBAXIN) 500 MG tablet Take 500 mg by mouth daily.    . Multiple Vitamins-Minerals (PRESERVISION AREDS 2 PO) Take by mouth.    . oxyCODONE-acetaminophen (PERCOCET/ROXICET) 5-325 MG tablet Take 1 tablet by mouth every 8 (eight)  hours as needed for severe pain. 30 tablet 0  . pravastatin (PRAVACHOL) 40 MG tablet Take 1 tablet (40 mg total) by mouth every evening. 90 tablet 3  . senna-docusate (SENNA S) 8.6-50 MG tablet 1 to 2 twice daily for constipation 120 tablet 5  . traZODone (DESYREL) 50 MG tablet Take 0.5-1 tablets (25-50 mg total) by mouth at bedtime as needed for sleep. 30 tablet 3   No current facility-administered medications for this visit.   Facility-Administered Medications Ordered in Other Visits  Medication Dose Route Frequency Provider Last Rate Last Admin  . sodium chloride (PF) 0.9 % injection             SURGICAL HISTORY:  Past Surgical History:  Procedure Laterality Date  . 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 BELPHAROPTOSIS REPAIR Bilateral   . CATARACT EXTRACTION W/ INTRAOCULAR LENS  IMPLANT, BILATERAL    . COLONOSCOPY N/A 11/03/2014   Procedure: COLONOSCOPY;  Surgeon: Rogene Houston, MD;  Location: [facility];  Service: Endoscopy;  Laterality: N/A;  1225  . IR IMAGING GUIDED PORT INSERTION  07/11/2018  . KNEE CARTILAGE SURGERY Left    Left knee  . SKIN CANCER EXCISION  12/2014, 04/25/15  . VIDEO BRONCHOSCOPY Bilateral 05/09/2018   Procedure: VIDEO BRONCHOSCOPY WITH FLUORO;  Surgeon: Juanito Doom, MD;  Location: [facility];  Service: Cardiopulmonary;  Laterality: Bilateral;    REVIEW OF SYSTEMS:   Review of Systems  Constitutional: Negative for appetite change, chills, fatigue, fever and unexpected weight change.  HENT:   Negative for mouth sores, nosebleeds, sore throat and trouble swallowing.   Eyes: Negative for eye problems and icterus.  Respiratory: Negative for cough, hemoptysis, shortness of breath and wheezing.   Cardiovascular: Negative for chest pain and leg swelling.  Gastrointestinal: Negative for abdominal pain, constipation, diarrhea, nausea and vomiting.  Genitourinary: Negative for bladder incontinence, difficulty urinating, dysuria, frequency and hematuria.    Musculoskeletal: Negative for back pain, gait problem, neck pain and neck stiffness.  Skin: Negative for itching and rash.  Neurological: Negative for dizziness, extremity weakness, gait problem, headaches, light-headedness and seizures.  Hematological: Negative for adenopathy. Does not bruise/bleed easily.  Psychiatric/Behavioral: Negative for confusion, depression and sleep disturbance. The patient is not nervous/anxious.     PHYSICAL EXAMINATION:  There were no vitals taken for this visit.  ECOG PERFORMANCE STATUS: 1 - Symptomatic but completely ambulatory  Physical Exam  Constitutional: Oriented to person, place, and time and well-developed, well-nourished, and in no distress. No distress.  HENT:  Head: Normocephalic and atraumatic.  Mouth/Throat: Oropharynx is clear and moist. No oropharyngeal exudate.  Eyes: Conjunctivae are normal. Right eye exhibits no discharge. Left eye exhibits no discharge. No scleral icterus.  Neck: Normal range of motion. Neck supple.  Cardiovascular: Normal rate, regular rhythm, normal heart sounds and intact distal pulses.   Pulmonary/Chest: Effort normal and breath sounds normal.  No respiratory distress. No wheezes. No rales.  Abdominal: Soft. Bowel sounds are normal. Exhibits no distension and no mass. There is no tenderness.  Musculoskeletal: Normal range of motion. Exhibits no edema.  Lymphadenopathy:    No cervical adenopathy.  Neurological: Alert and oriented to person, place, and time. Exhibits normal muscle tone. Gait normal. Coordination normal.  Skin: Skin is warm and dry. No rash noted. Not diaphoretic. No erythema. No pallor.  Psychiatric: Mood, memory and judgment normal.  Vitals reviewed.  LABORATORY DATA: Lab Results  Component Value Date   WBC 6.0 10/27/2019   HGB 13.6 10/27/2019   HCT 40.2 10/27/2019   MCV 90.7 10/27/2019   PLT 161 10/27/2019      Chemistry      Component Value Date/Time   NA 141 10/27/2019 0908   NA 144  04/09/2018 1507   K 4.9 10/27/2019 0908   CL 108 10/27/2019 0908   CO2 27 10/27/2019 0908   BUN 15 10/27/2019 0908   BUN 15 04/09/2018 1507   CREATININE 1.02 10/27/2019 0908      Component Value Date/Time   CALCIUM 9.4 10/27/2019 0908   ALKPHOS 60 10/27/2019 0908   AST 18 10/27/2019 0908   ALT 20 10/27/2019 0908   BILITOT 0.4 10/27/2019 0908       RADIOGRAPHIC STUDIES:  CT Chest W Contrast  Result Date: 11/16/2019 CLINICAL DATA:  Non-small cell lung cancer. Radiation therapy to lungs complete. Immunotherapy ongoing. Low back pain. EXAM: CT CHEST, ABDOMEN AND PELVIS WITHOUT CONTRAST TECHNIQUE: Multidetector CT imaging of the chest, abdomen and pelvis was performed following the standard protocol without IV contrast. COMPARISON:  None. FINDINGS: CT CHEST FINDINGS Cardiovascular: Coronary artery calcification and aortic atherosclerotic calcification. Port in the anterior chest wall with tip in distal SVC. Mediastinum/Nodes: Soft tissue in the LEFT axilla measuring 1.3 by 1.9 cm compares to 1.2 x 2.0 cm for no change from most recent CT. Lesion was an enlarged hypermetabolic mass on comparison PET-CT scan 05/05/2018 No supraclavicular adenopathy. No mediastinal hilar adenopathy no pericardial effusion. Esophagus normal Lungs/Pleura: Mass in lingula measures 3.0 x 2.7 cm compared to 4.2 x 3.2 cm. Visually the lesion looks very similar. Lesion difficult to measure due flattened shape. Favor no significant change. There is a band of perihilar arc like fibrosis in the RIGHT lung unchanged from prior consistent radiation change No new nodularity in LEFT or RIGHT lung. Musculoskeletal: No aggressive osseous lesion. CT ABDOMEN AND PELVIS FINDINGS Hepatobiliary: Low-density lesion in the LEFT hepatic lobe consistent benign cysts. No enhancing lesion. Gallbladder normal Pancreas: Pancreas is normal. No ductal dilatation. No pancreatic inflammation. Spleen: Normal spleen Adrenals/urinary tract: Adrenal glands  and kidneys are normal. The ureters and bladder normal. Stomach/Bowel: Stomach, small bowel, appendix, and cecum are normal. Multiple diverticula of the descending colon and sigmoid colon without acute inflammation. Vascular/Lymphatic: Abdominal aorta is normal caliber with atherosclerotic calcification. There is no retroperitoneal or periportal lymphadenopathy. No pelvic lymphadenopathy. Reproductive: Prostate normal Other: No peritoneal metastasis Musculoskeletal: No aggressive osseous lesion. IMPRESSION: 1. Essentially stable mass within lingula. 2. Stable postradiation change in the RIGHT lung. 3. Stable soft tissue nodularity in the LEFT axilla (metastatic lesion on prior PET-CT scan). 4. No evidence of metastatic progression in thorax. 5. No evidence of metastatic disease in the abdomen pelvis. 6. No skeletal metastasis Electronically Signed   By: Suzy Bouchard M.D.   On: 11/16/2019 13:59   CT Abdomen Pelvis W Contrast  Result Date: 11/16/2019 CLINICAL DATA:  Non-small  cell lung cancer. Radiation therapy to lungs complete. Immunotherapy ongoing. Low back pain. EXAM: CT CHEST, ABDOMEN AND PELVIS WITHOUT CONTRAST TECHNIQUE: Multidetector CT imaging of the chest, abdomen and pelvis was performed following the standard protocol without IV contrast. COMPARISON:  None. FINDINGS: CT CHEST FINDINGS Cardiovascular: Coronary artery calcification and aortic atherosclerotic calcification. Port in the anterior chest wall with tip in distal SVC. Mediastinum/Nodes: Soft tissue in the LEFT axilla measuring 1.3 by 1.9 cm compares to 1.2 x 2.0 cm for no change from most recent CT. Lesion was an enlarged hypermetabolic mass on comparison PET-CT scan 05/05/2018 No supraclavicular adenopathy. No mediastinal hilar adenopathy no pericardial effusion. Esophagus normal Lungs/Pleura: Mass in lingula measures 3.0 x 2.7 cm compared to 4.2 x 3.2 cm. Visually the lesion looks very similar. Lesion difficult to measure due flattened  shape. Favor no significant change. There is a band of perihilar arc like fibrosis in the RIGHT lung unchanged from prior consistent radiation change No new nodularity in LEFT or RIGHT lung. Musculoskeletal: No aggressive osseous lesion. CT ABDOMEN AND PELVIS FINDINGS Hepatobiliary: Low-density lesion in the LEFT hepatic lobe consistent benign cysts. No enhancing lesion. Gallbladder normal Pancreas: Pancreas is normal. No ductal dilatation. No pancreatic inflammation. Spleen: Normal spleen Adrenals/urinary tract: Adrenal glands and kidneys are normal. The ureters and bladder normal. Stomach/Bowel: Stomach, small bowel, appendix, and cecum are normal. Multiple diverticula of the descending colon and sigmoid colon without acute inflammation. Vascular/Lymphatic: Abdominal aorta is normal caliber with atherosclerotic calcification. There is no retroperitoneal or periportal lymphadenopathy. No pelvic lymphadenopathy. Reproductive: Prostate normal Other: No peritoneal metastasis Musculoskeletal: No aggressive osseous lesion. IMPRESSION: 1. Essentially stable mass within lingula. 2. Stable postradiation change in the RIGHT lung. 3. Stable soft tissue nodularity in the LEFT axilla (metastatic lesion on prior PET-CT scan). 4. No evidence of metastatic progression in thorax. 5. No evidence of metastatic disease in the abdomen pelvis. 6. No skeletal metastasis Electronically Signed   By: Suzy Bouchard M.D.   On: 11/16/2019 13:59     ASSESSMENT/PLAN:  This is a very pleasant 74 year old Caucasian male with a very light smoking history. He was diagnosed with stage IV (T3, N0, M1C) non-small cell lung cancer, squamous cell carcinoma based on the biopsy of the left axillary mass. He was diagnosed in August 2019.   He is status post 4 cycles of induction systemic chemotherapy with carboplatin, paclitaxel, and Keytruda with a partial response. The patient is currently undergoing maintenance immunotherapy with Keytruda  200 mg IV every 3 weeks. He is status post23cyclesof single agent Keytruda. He developed a skin rash after cycle #7 which resolved with a Medrol Dosepak and Benadryl.Otherwise, he is tolerating treatment well without any adverse effects.  The patient recently had a restaging CT scan performed.  Dr. Julien Nordmann personally and independently reviewed the scan and discussed the results with the patient today.  The scan did not show any evidence of disease progression.  Dr. Julien Nordmann recommends the patient continue on the same treatment at the same dose. Labs were reviewed with the patient. We recommend that he proceed with cycle #24 of single agent Keytruda today.  We will see him back for follow-up visit in 3 weeks for evaluation before starting cycle #25 with single agent Keytruda.  Regarding his back pain, no osseous lesions were seen. Encouraged to take tylenol for his pain and continue his exercises. Discussed he can take ibuprofen every once in awhile if needed.   For his itching, I sent a prescription of  atarax to his pharmacy. Discussed that this may cause drowsiness so to be aware of this side effect prior to taking it and not to take it prior to driving, for example.   The patient was advised to call immediately if he has any concerning symptoms in the interval. The patient voices understanding of current disease status and treatment options and is in agreement with the current care plan. All questions were answered. The patient knows to call the clinic with any problems, questions or concerns. We can certainly see the patient much sooner if necessary.    No orders of the defined types were placed in this encounter.    Bayan Kushnir L Donnivan Villena, PA-C 11/16/19  ADDENDUM: Hematology/Oncology Attending: I had a face-to-face encounter with the patient today.  I recommended his care plan.  This is a very pleasant 74 years old white male with stage IV non-small cell lung cancer, squamous  cell carcinoma status post induction systemic chemotherapy with carboplatin, paclitaxel and Keytruda for 4 cycles.  This was followed by maintenance treatment with [facility] every 3 weeks status post 23 cycles.  The patient has been tolerating this treatment well except for dry skin and itching.  He is requesting antihistamine to help with the itching. The patient also has chronic back pain and he was followed by orthopedic surgery and underwent physical therapy. He had repeat CT scan of the chest, abdomen pelvis performed recently.  I personally and independently reviewed the scans and discussed the results with the patient today. His scan showed no concerning findings for disease progression and no evidence for skeletal metastasis to explain his back pain. I recommended for the patient to continue his current treatment with [facility] and he will proceed with cycle #24 today. For the itching, we will give the patient prescription for Atarax. For the hypothyroidism, he will continue with his current dose of levothyroxine and will continue to monitor his TSH closely. The patient was advised to call immediately if he has any concerning symptoms in the interval.  Disclaimer: This note was dictated with voice recognition software. Similar sounding words can inadvertently be transcribed and may be missed upon review. Eilleen Kempf, MD 11/17/19

## 2019-11-17 ENCOUNTER — Inpatient Hospital Stay: Payer: Medicare Other

## 2019-11-17 ENCOUNTER — Encounter: Payer: Self-pay | Admitting: Physician Assistant

## 2019-11-17 ENCOUNTER — Other Ambulatory Visit: Payer: Self-pay

## 2019-11-17 ENCOUNTER — Inpatient Hospital Stay: Payer: Medicare Other | Attending: Oncology | Admitting: Physician Assistant

## 2019-11-17 VITALS — BP 122/82 | HR 71 | Temp 97.4°F | Resp 18 | Ht 74.0 in | Wt 244.1 lb

## 2019-11-17 DIAGNOSIS — Z85828 Personal history of other malignant neoplasm of skin: Secondary | ICD-10-CM | POA: Diagnosis not present

## 2019-11-17 DIAGNOSIS — R197 Diarrhea, unspecified: Secondary | ICD-10-CM | POA: Insufficient documentation

## 2019-11-17 DIAGNOSIS — E039 Hypothyroidism, unspecified: Secondary | ICD-10-CM | POA: Insufficient documentation

## 2019-11-17 DIAGNOSIS — Z5112 Encounter for antineoplastic immunotherapy: Secondary | ICD-10-CM | POA: Diagnosis not present

## 2019-11-17 DIAGNOSIS — L299 Pruritus, unspecified: Secondary | ICD-10-CM | POA: Diagnosis not present

## 2019-11-17 DIAGNOSIS — Z95828 Presence of other vascular implants and grafts: Secondary | ICD-10-CM

## 2019-11-17 DIAGNOSIS — Z79899 Other long term (current) drug therapy: Secondary | ICD-10-CM | POA: Insufficient documentation

## 2019-11-17 DIAGNOSIS — E785 Hyperlipidemia, unspecified: Secondary | ICD-10-CM | POA: Diagnosis not present

## 2019-11-17 DIAGNOSIS — M545 Low back pain: Secondary | ICD-10-CM | POA: Diagnosis not present

## 2019-11-17 DIAGNOSIS — I10 Essential (primary) hypertension: Secondary | ICD-10-CM | POA: Diagnosis not present

## 2019-11-17 DIAGNOSIS — K5900 Constipation, unspecified: Secondary | ICD-10-CM | POA: Insufficient documentation

## 2019-11-17 DIAGNOSIS — C3412 Malignant neoplasm of upper lobe, left bronchus or lung: Secondary | ICD-10-CM | POA: Diagnosis not present

## 2019-11-17 DIAGNOSIS — Z923 Personal history of irradiation: Secondary | ICD-10-CM | POA: Diagnosis not present

## 2019-11-17 DIAGNOSIS — G8929 Other chronic pain: Secondary | ICD-10-CM | POA: Insufficient documentation

## 2019-11-17 DIAGNOSIS — I2510 Atherosclerotic heart disease of native coronary artery without angina pectoris: Secondary | ICD-10-CM | POA: Diagnosis not present

## 2019-11-17 DIAGNOSIS — C3491 Malignant neoplasm of unspecified part of right bronchus or lung: Secondary | ICD-10-CM | POA: Insufficient documentation

## 2019-11-17 DIAGNOSIS — C7801 Secondary malignant neoplasm of right lung: Secondary | ICD-10-CM

## 2019-11-17 LAB — CMP (CANCER CENTER ONLY)
ALT: 14 U/L (ref 0–44)
AST: 18 U/L (ref 15–41)
Albumin: 3.8 g/dL (ref 3.5–5.0)
Alkaline Phosphatase: 58 U/L (ref 38–126)
Anion gap: 7 (ref 5–15)
BUN: 14 mg/dL (ref 8–23)
CO2: 24 mmol/L (ref 22–32)
Calcium: 8.6 mg/dL — ABNORMAL LOW (ref 8.9–10.3)
Chloride: 111 mmol/L (ref 98–111)
Creatinine: 0.95 mg/dL (ref 0.61–1.24)
GFR, Est AFR Am: >60 mL/min (ref >60)
GFR, Estimated: >60 mL/min (ref >60)
Glucose, Bld: 105 mg/dL — ABNORMAL HIGH (ref 70–99)
Potassium: 3.8 mmol/L (ref 3.5–5.1)
Sodium: 142 mmol/L (ref 135–145)
Total Bilirubin: 0.4 mg/dL (ref 0.3–1.2)
Total Protein: 7.1 g/dL (ref 6.5–8.1)

## 2019-11-17 LAB — CBC WITH DIFFERENTIAL (CANCER CENTER ONLY)
Abs Immature Granulocytes: 0.02 10*3/uL (ref 0.00–0.07)
Basophils Absolute: 0 10*3/uL (ref 0.0–0.1)
Basophils Relative: 1 %
Eosinophils Absolute: 0.2 10*3/uL (ref 0.0–0.5)
Eosinophils Relative: 5 %
HCT: 35.1 % — ABNORMAL LOW (ref 39.0–52.0)
Hemoglobin: 12.1 g/dL — ABNORMAL LOW (ref 13.0–17.0)
Immature Granulocytes: 0 %
Lymphocytes Relative: 20 %
Lymphs Abs: 0.9 10*3/uL (ref 0.7–4.0)
MCH: 31.5 pg (ref 26.0–34.0)
MCHC: 34.5 g/dL (ref 30.0–36.0)
MCV: 91.4 fL (ref 80.0–100.0)
Monocytes Absolute: 0.8 10*3/uL (ref 0.1–1.0)
Monocytes Relative: 17 %
Neutro Abs: 2.7 10*3/uL (ref 1.7–7.7)
Neutrophils Relative %: 57 %
Platelet Count: 153 10*3/uL (ref 150–400)
RBC: 3.84 MIL/uL — ABNORMAL LOW (ref 4.22–5.81)
RDW: 12 % (ref 11.5–15.5)
WBC Count: 4.6 10*3/uL (ref 4.0–10.5)
nRBC: 0 % (ref 0.0–0.2)

## 2019-11-17 LAB — TSH: TSH: 1.946 u[IU]/mL (ref 0.320–4.118)

## 2019-11-17 MED ORDER — SODIUM CHLORIDE 0.9% FLUSH
10.0000 mL | INTRAVENOUS | Status: DC | PRN
  Administered 2019-11-17: 10 mL
  Filled 2019-11-17: qty 10

## 2019-11-17 MED ORDER — HYDROXYZINE HCL 10 MG PO TABS: 10.0000 mg | ORAL_TABLET | Freq: Three times a day (TID) | ORAL | 2 refills | Status: DC | PRN

## 2019-11-17 MED ORDER — SODIUM CHLORIDE 0.9% FLUSH
10.0000 mL | Freq: Once | INTRAVENOUS | Status: AC
  Administered 2019-11-17: 10:00:00 10 mL
  Filled 2019-11-17: qty 10

## 2019-11-17 MED ORDER — SODIUM CHLORIDE 0.9 % IV SOLN
Freq: Once | INTRAVENOUS | Status: AC
  Filled 2019-11-17: qty 250

## 2019-11-17 MED ORDER — SODIUM CHLORIDE 0.9 % IV SOLN
200.0000 mg | Freq: Once | INTRAVENOUS | Status: AC
  Administered 2019-11-17: 200 mg via INTRAVENOUS
  Filled 2019-11-17: qty 8

## 2019-11-17 MED ORDER — HEPARIN SOD (PORK) LOCK FLUSH 100 UNIT/ML IV SOLN
500.0000 [IU] | Freq: Once | INTRAVENOUS | Status: AC | PRN
  Administered 2019-11-17: 500 [IU]
  Filled 2019-11-17: qty 5

## 2019-11-17 NOTE — Patient Instructions (Signed)
COVID-19 Vaccine Information can be found at: [email or web address] For questions related to vaccine distribution or appointments, please email [email or web address] or call [phone].  Pembrolizumab injection What is this medicine? PEMBROLIZUMAB (pem broe liz ue mab) is a monoclonal antibody. It is used to treat certain types of cancer. This medicine may be used for other purposes; ask your health care provider or pharmacist if you have questions. COMMON BRAND NAME(S): Keytruda What should I tell my health care provider before I take this medicine? They need to know if you have any of these conditions:  diabetes  immune system problems  inflammatory bowel disease  liver disease  lung or breathing disease  lupus  received or scheduled to receive an organ transplant or a stem-cell transplant that uses donor stem cells  an unusual or allergic reaction to pembrolizumab, other medicines, foods, dyes, or preservatives  pregnant or trying to get pregnant  breast-feeding How should I use this medicine? This medicine is for infusion into a vein. It is given by a health care professional in a hospital or clinic setting. A special MedGuide will be given to you before each treatment. Be sure to read this information carefully each time. Talk to your pediatrician regarding the use of this medicine in children. While this drug may be prescribed for children as young as 6 months for selected conditions, precautions do apply. Overdosage: If you think you have taken too much of this medicine contact a poison control center or emergency room at once. NOTE: This medicine is only for you. Do not share this medicine with others. What if I miss a dose? It is important not to miss your dose. Call your doctor or health care professional if you are unable to keep an appointment. What may interact with this medicine? Interactions have not been  studied. Give your health care provider a list of all the medicines, herbs, non-prescription drugs, or dietary supplements you use. Also tell them if you smoke, drink alcohol, or use illegal drugs. Some items may interact with your medicine. This list may not describe all possible interactions. Give your health care provider a list of all the medicines, herbs, non-prescription drugs, or dietary supplements you use. Also tell them if you smoke, drink alcohol, or use illegal drugs. Some items may interact with your medicine. What should I watch for while using this medicine? Your condition will be monitored carefully while you are receiving this medicine. You may need blood work done while you are taking this medicine. Do not become pregnant while taking this medicine or for 4 months after stopping it. Women should inform their doctor if they wish to become pregnant or think they might be pregnant. There is a potential for serious side effects to an unborn child. Talk to your health care professional or pharmacist for more information. Do not breast-feed an infant while taking this medicine or for 4 months after the last dose. What side effects may I notice from receiving this medicine? Side effects that you should report to your doctor or health care professional as soon as possible:  allergic reactions like skin rash, itching or hives, swelling of the face, lips, or tongue  bloody or black, tarry  breathing problems  changes in vision  chest pain  chills  confusion  constipation  cough  diarrhea  dizziness or feeling faint or lightheaded  fast or irregular heartbeat  fever  flushing  joint pain  low blood counts - this medicine may decrease the number of  white blood cells, red blood cells and platelets. You may be at increased risk for infections and bleeding.  muscle pain  muscle weakness  pain, tingling, numbness in the hands or feet  persistent headache  redness,  blistering, peeling or loosening of the skin, including inside the mouth  signs and symptoms of high blood sugar such as dizziness; dry mouth; dry skin; fruity breath; nausea; stomach pain; increased hunger or thirst; increased urination  signs and symptoms of kidney injury like trouble passing urine or change in the amount of urine  signs and symptoms of liver injury like dark urine, light-colored stools, loss of appetite, nausea, right upper belly pain, yellowing of the eyes or skin  sweating  swollen lymph nodes  weight loss Side effects that usually do not require medical attention (report to your doctor or health care professional if they continue or are bothersome):  decreased appetite  hair loss  muscle pain  tiredness This list may not describe all possible side effects. Call your doctor for medical advice about side effects. You may report side effects to FDA at 1-800-FDA-1088. Where should I keep my medicine? This drug is given in a hospital or clinic and will not be stored at home. NOTE: This sheet is a summary. It may not cover all possible information. If you have questions about this medicine, talk to your doctor, pharmacist, or health care provider.  2020 Elsevier/Gold Standard (2019-07-03 18:07:58)  Coronavirus (COVID-19) Are you at risk?  Are you at risk for the Coronavirus (COVID-19)?  To be considered HIGH RISK for Coronavirus (COVID-19), you have to meet the following criteria:  . Traveled to [place], [place], [place], [place] or [place]; or in the [place] to [place], [place], [place], or [place]; and have fever, cough, and shortness of breath within the last 2 weeks of travel OR . Been in close contact with a person diagnosed with COVID-19 within the last 2 weeks and have fever, cough, and shortness of breath . IF YOU DO NOT MEET THESE CRITERIA, YOU ARE CONSIDERED LOW RISK FOR COVID-19.  What to do if you are HIGH RISK for COVID-19?  Marland Kitchen If you  are having a medical emergency, call 911. . Seek medical care right away. Before you go to a doctor's office, urgent care or emergency department, call ahead and tell them about your recent travel, contact with someone diagnosed with COVID-19, and your symptoms. You should receive instructions from your physician's office regarding next steps of care.  . When you arrive at healthcare provider, tell the healthcare staff immediately you have returned from visiting [place], [place], [place], [place] or [place]; or traveled in the [place] to [place], [place], [place], or [place]; in the last two weeks or you have been in close contact with a person diagnosed with COVID-19 in the last 2 weeks.   . Tell the health care staff about your symptoms: fever, cough and shortness of breath. . After you have been seen by a medical provider, you will be either: o Tested for (COVID-19) and discharged home on quarantine except to seek medical care if symptoms worsen, and asked to  - Stay home and avoid contact with others until you get your results (4-5 days)  - Avoid travel on public transportation if possible (such as bus, train, or airplane) or o Sent to the Emergency Department by EMS for evaluation, COVID-19 testing, and possible admission depending on your condition and test results.  What to do if you are LOW RISK for COVID-19?  Reduce your risk of any infection by using the same precautions used for avoiding the common cold or flu:  Marland Kitchen Wash your hands often with soap and warm water for at least 20 seconds.  If soap and water are not readily available, use an alcohol-based hand sanitizer with at least 60% alcohol.  . If coughing or sneezing, cover your mouth and nose by coughing or sneezing into the elbow areas of your shirt or coat, into a tissue or into your sleeve (not your hands). . Avoid shaking hands with others and consider head nods or verbal greetings only. . Avoid touching your eyes, nose,  or mouth with unwashed hands.  . Avoid close contact with people who are sick. . Avoid places or events with large numbers of people in one location, like concerts or sporting events. . Carefully consider travel plans you have or are making. . If you are planning any travel outside or inside the [place], visit the CDC's Travelers' Health webpage for the latest health notices. . If you have some symptoms but not all symptoms, continue to monitor at home and seek medical attention if your symptoms worsen. . If you are having a medical emergency, call 911.   [place] / e-Visit: [email or web address]         [facility]: [facility]: [phone]                   [facility]: [phone]

## 2019-11-18 ENCOUNTER — Telehealth: Payer: Self-pay | Admitting: Physician Assistant

## 2019-11-18 NOTE — Telephone Encounter (Signed)
Scheduled per los. Called and left msg about added appts. Mailed printout

## 2019-12-02 ENCOUNTER — Other Ambulatory Visit: Payer: Self-pay | Admitting: Family Medicine

## 2019-12-02 NOTE — Progress Notes (Signed)
Pharmacist Chemotherapy Monitoring - Follow Up Assessment    I verify that I have reviewed each item in the below checklist:  . Regimen for the patient is scheduled for the appropriate day and plan matches scheduled date. Marland Kitchen Appropriate non-routine labs are ordered dependent on drug ordered. . If applicable, additional medications reviewed and ordered per protocol based on lifetime cumulative doses and/or treatment regimen.   Plan for follow-up and/or issues identified: No . I-vent associated with next due treatment: No . MD and/or nursing notified: No  Datha Kissinger D 12/02/2019 2:02 PM

## 2019-12-08 ENCOUNTER — Inpatient Hospital Stay (HOSPITAL_BASED_OUTPATIENT_CLINIC_OR_DEPARTMENT_OTHER): Payer: Medicare Other | Admitting: Internal Medicine

## 2019-12-08 ENCOUNTER — Encounter: Payer: Self-pay | Admitting: Internal Medicine

## 2019-12-08 ENCOUNTER — Inpatient Hospital Stay: Payer: Medicare Other

## 2019-12-08 ENCOUNTER — Other Ambulatory Visit: Payer: Self-pay

## 2019-12-08 VITALS — BP 119/81 | HR 70 | Temp 98.3°F | Resp 18 | Ht 74.0 in | Wt 243.0 lb

## 2019-12-08 DIAGNOSIS — Z923 Personal history of irradiation: Secondary | ICD-10-CM | POA: Diagnosis not present

## 2019-12-08 DIAGNOSIS — C3412 Malignant neoplasm of upper lobe, left bronchus or lung: Secondary | ICD-10-CM | POA: Diagnosis not present

## 2019-12-08 DIAGNOSIS — R197 Diarrhea, unspecified: Secondary | ICD-10-CM | POA: Diagnosis not present

## 2019-12-08 DIAGNOSIS — K5900 Constipation, unspecified: Secondary | ICD-10-CM | POA: Diagnosis not present

## 2019-12-08 DIAGNOSIS — E039 Hypothyroidism, unspecified: Secondary | ICD-10-CM

## 2019-12-08 DIAGNOSIS — C3491 Malignant neoplasm of unspecified part of right bronchus or lung: Secondary | ICD-10-CM

## 2019-12-08 DIAGNOSIS — C7801 Secondary malignant neoplasm of right lung: Secondary | ICD-10-CM | POA: Diagnosis not present

## 2019-12-08 DIAGNOSIS — Z95828 Presence of other vascular implants and grafts: Secondary | ICD-10-CM

## 2019-12-08 DIAGNOSIS — Z5112 Encounter for antineoplastic immunotherapy: Secondary | ICD-10-CM | POA: Diagnosis not present

## 2019-12-08 LAB — CMP (CANCER CENTER ONLY)
ALT: 15 U/L (ref 0–44)
AST: 17 U/L (ref 15–41)
Albumin: 3.9 g/dL (ref 3.5–5.0)
Alkaline Phosphatase: 55 U/L (ref 38–126)
Anion gap: 9 (ref 5–15)
BUN: 13 mg/dL (ref 8–23)
CO2: 23 mmol/L (ref 22–32)
Calcium: 9.1 mg/dL (ref 8.9–10.3)
Chloride: 110 mmol/L (ref 98–111)
Creatinine: 0.95 mg/dL (ref 0.61–1.24)
GFR, Est AFR Am: >60 mL/min (ref >60)
GFR, Estimated: >60 mL/min (ref >60)
Glucose, Bld: 92 mg/dL (ref 70–99)
Potassium: 4.2 mmol/L (ref 3.5–5.1)
Sodium: 142 mmol/L (ref 135–145)
Total Bilirubin: 0.6 mg/dL (ref 0.3–1.2)
Total Protein: 7.4 g/dL (ref 6.5–8.1)

## 2019-12-08 LAB — CBC WITH DIFFERENTIAL (CANCER CENTER ONLY)
Abs Immature Granulocytes: 0.02 10*3/uL (ref 0.00–0.07)
Basophils Absolute: 0 10*3/uL (ref 0.0–0.1)
Basophils Relative: 1 %
Eosinophils Absolute: 0.3 10*3/uL (ref 0.0–0.5)
Eosinophils Relative: 6 %
HCT: 36.5 % — ABNORMAL LOW (ref 39.0–52.0)
Hemoglobin: 12.5 g/dL — ABNORMAL LOW (ref 13.0–17.0)
Immature Granulocytes: 0 %
Lymphocytes Relative: 19 %
Lymphs Abs: 0.9 10*3/uL (ref 0.7–4.0)
MCH: 31.8 pg (ref 26.0–34.0)
MCHC: 34.2 g/dL (ref 30.0–36.0)
MCV: 92.9 fL (ref 80.0–100.0)
Monocytes Absolute: 0.8 10*3/uL (ref 0.1–1.0)
Monocytes Relative: 15 %
Neutro Abs: 2.8 10*3/uL (ref 1.7–7.7)
Neutrophils Relative %: 59 %
Platelet Count: 148 10*3/uL — ABNORMAL LOW (ref 150–400)
RBC: 3.93 MIL/uL — ABNORMAL LOW (ref 4.22–5.81)
RDW: 12.2 % (ref 11.5–15.5)
WBC Count: 4.9 10*3/uL (ref 4.0–10.5)
nRBC: 0 % (ref 0.0–0.2)

## 2019-12-08 LAB — TSH: TSH: 1.162 u[IU]/mL (ref 0.320–4.118)

## 2019-12-08 MED ORDER — SODIUM CHLORIDE 0.9% FLUSH
10.0000 mL | INTRAVENOUS | Status: DC | PRN
  Administered 2019-12-08: 10 mL
  Filled 2019-12-08: qty 10

## 2019-12-08 MED ORDER — SODIUM CHLORIDE 0.9 % IV SOLN
200.0000 mg | Freq: Once | INTRAVENOUS | Status: AC
  Administered 2019-12-08: 200 mg via INTRAVENOUS
  Filled 2019-12-08: qty 8

## 2019-12-08 MED ORDER — SODIUM CHLORIDE 0.9 % IV SOLN
Freq: Once | INTRAVENOUS | Status: AC
  Filled 2019-12-08: qty 250

## 2019-12-08 MED ORDER — HEPARIN SOD (PORK) LOCK FLUSH 100 UNIT/ML IV SOLN
500.0000 [IU] | Freq: Once | INTRAVENOUS | Status: AC | PRN
  Administered 2019-12-08: 500 [IU]
  Filled 2019-12-08: qty 5

## 2019-12-08 NOTE — Progress Notes (Signed)
[facility] Telephone:([phone]) [phone]   Fax:([phone]  OFFICE PROGRESS NOTE  Travis Norlander, DO [place] [address]  DIAGNOSIS: Stage IV (T3, N0, M1c) non-small cell lung cancer, squamous cell carcinoma presented with large right infrahilar mass in addition to left upper lobe lung nodule as well as left axillary mass with left axillary lymph node diagnosed in August 2019.  PRIOR THERAPY:  1) Palliative radiotherapy to the right infrahilar mass as well as the axillary mass under the care of Dr. Lisbeth Renshaw. 2) Systemic chemotherapy with carboplatin for AUC of 5, paclitaxel 175 mg/M2 and Keytruda 200 mg IV every 3 weeks status post 4 cycles.  CURRENT THERAPY: Maintenance immunotherapy with single agent Keytruda 200 mg IV every 3 weeks status post 24 cycles.  INTERVAL HISTORY: Travis Padilla 74 y.o. male returns to the clinic today for follow-up visit.  The patient is feeling fine today with no concerning complaints.  He is more active these days.  He exercises at least twice a week.  He denied having any current chest pain, shortness of breath, cough or hemoptysis.  He denied having any fever or chills.  He has no nausea, vomiting, diarrhea or constipation.  He continues to tolerate his maintenance treatment with Keytruda fairly well.  The patient is here today for evaluation before starting cycle #25 of his maintenance treatment.   MEDICAL HISTORY: Past Medical History:  Diagnosis Date  . Abnormal nuclear stress test    December, 2013  . Anemia   . CAD (coronary artery disease)    Mild nonobstructive plaque in cath 2013  . Chest pain    December, 2013  . Dizziness   . Gout   . Hemorrhoid   . Hyperlipidemia   . Hypertension   . 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 Skin cancer   . Spinal stenosis     ALLERGIES:  is allergic to trazodone and  nefazodone.  MEDICATIONS:  Current Outpatient Medications  Medication Sig Dispense Refill  . albuterol (PROVENTIL) (2.5 MG/3ML) 0.083% nebulizer solution Take 3 mLs (2.5 mg total) by nebulization every 6 (six) hours as needed for wheezing or shortness of breath. (Patient not taking: Reported on 11/03/2019) 75 mL 12  . ALPRAZolam (XANAX) 0.25 MG tablet Take 1 tablet (0.25 mg total) by mouth at bedtime as needed for anxiety. 15 tablet 0  . Apple Cider Vinegar 500 MG TABS Take by mouth.    . Artificial Tear Solution (SOOTHE XP OP) Place 1 drop into both eyes 2 (two) times daily.    . carvedilol (COREG) 12.5 MG tablet Take 1 tablet (12.5 mg total) by mouth 2 (two) times daily with a meal. 180 tablet 3  . Cyanocobalamin (VITAMIN B 12) 250 MCG LOZG Take by mouth.    . diphenhydramine-acetaminophen (TYLENOL PM) 25-500 MG TABS tablet Take 1-2 tablets by mouth at bedtime as needed (pain).    . ferrous sulfate 324 MG TBEC Take 324 mg by mouth daily with breakfast.    . hydrOXYzine (ATARAX/VISTARIL) 10 MG tablet Take 1 tablet (10 mg total) by mouth 3 (three) times daily as needed for itching. 30 tablet 2  . levothyroxine (SYNTHROID) 150 MCG tablet TAKE 1 TABLET BY MOUTH EVERY DAY BEFORE BREAKFAST 30 tablet 0  . lidocaine-prilocaine (EMLA) cream Apply 1 application topically as needed. 30 g 1  .  methocarbamol (ROBAXIN) 500 MG tablet Take 500 mg by mouth daily.    . mirtazapine (REMERON) 15 MG tablet Take 15 mg by mouth at bedtime.    . Multiple Vitamins-Minerals (PRESERVISION AREDS 2 PO) Take by mouth.    . oxyCODONE-acetaminophen (PERCOCET/ROXICET) 5-325 MG tablet Take 1 tablet by mouth every 8 (eight) hours as needed for severe pain. 30 tablet 0  . pravastatin (PRAVACHOL) 40 MG tablet Take 1 tablet (40 mg total) by mouth every evening. 90 tablet 3  . senna-docusate (SENNA S) 8.6-50 MG tablet 1 to 2 twice daily for constipation 120 tablet 5  . traZODone (DESYREL) 50 MG tablet TAKE 0.5-1 TABLETS (25-50 MG  TOTAL) BY MOUTH AT BEDTIME AS NEEDED FOR SLEEP. 90 tablet 0   No current facility-administered medications for this visit.    SURGICAL HISTORY:  Past Surgical History:  Procedure Laterality Date  . 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 BELPHAROPTOSIS REPAIR Bilateral   . CATARACT EXTRACTION W/ INTRAOCULAR LENS  IMPLANT, BILATERAL    . COLONOSCOPY N/A 11/03/2014   Procedure: COLONOSCOPY;  Surgeon: Rogene Houston, MD;  Location: [facility];  Service: Endoscopy;  Laterality: N/A;  1225  . IR IMAGING GUIDED PORT INSERTION  07/11/2018  . KNEE CARTILAGE SURGERY Left    Left knee  . SKIN CANCER EXCISION  12/2014, 04/25/15  . VIDEO BRONCHOSCOPY Bilateral 05/09/2018   Procedure: VIDEO BRONCHOSCOPY WITH FLUORO;  Surgeon: Juanito Doom, MD;  Location: [facility];  Service: Cardiopulmonary;  Laterality: Bilateral;    REVIEW OF SYSTEMS:  A comprehensive review of systems was negative.   PHYSICAL EXAMINATION: General appearance: alert, cooperative and no distress Head: Normocephalic, without obvious abnormality, atraumatic Neck: no adenopathy, no JVD, supple, symmetrical, trachea midline and thyroid not enlarged, symmetric, no tenderness/mass/nodules Lymph nodes: Cervical, supraclavicular, and axillary nodes normal. Resp: clear to auscultation bilaterally Back: symmetric, no curvature. ROM normal. No CVA tenderness. Cardio: regular rate and rhythm, S1, S2 normal, no murmur, click, rub or gallop GI: soft, non-tender; bowel sounds normal; no masses,  no organomegaly Extremities: extremities normal, atraumatic, no cyanosis or edema  ECOG PERFORMANCE STATUS: 1 - Symptomatic but completely ambulatory  Blood pressure 119/81, pulse 70, temperature 98.3 F (36.8 C), temperature source Oral, resp. rate 18, height 6\' 2"  (1.88 m), weight 243 lb (110.2 kg), SpO2 99 %.  LABORATORY DATA: Lab Results  Component Value Date   WBC 4.9 12/08/2019   HGB 12.5 (L) 12/08/2019   HCT 36.5 (L) 12/08/2019    MCV 92.9 12/08/2019   PLT 148 (L) 12/08/2019      Chemistry      Component Value Date/Time   NA 142 11/17/2019 0957   NA 144 04/09/2018 1507   K 3.8 11/17/2019 0957   CL 111 11/17/2019 0957   CO2 24 11/17/2019 0957   BUN 14 11/17/2019 0957   BUN 15 04/09/2018 1507   CREATININE 0.95 11/17/2019 0957      Component Value Date/Time   CALCIUM 8.6 (L) 11/17/2019 0957   ALKPHOS 58 11/17/2019 0957   AST 18 11/17/2019 0957   ALT 14 11/17/2019 0957   BILITOT 0.4 11/17/2019 0957       RADIOGRAPHIC STUDIES: CT Chest W Contrast  Result Date: 11/16/2019 CLINICAL DATA:  Non-small cell lung cancer. Radiation therapy to lungs complete. Immunotherapy ongoing. Low back pain. EXAM: CT CHEST, ABDOMEN AND PELVIS WITHOUT CONTRAST TECHNIQUE: Multidetector CT imaging of the chest, abdomen and pelvis was performed following the  standard protocol without IV contrast. COMPARISON:  None. FINDINGS: CT CHEST FINDINGS Cardiovascular: Coronary artery calcification and aortic atherosclerotic calcification. Port in the anterior chest wall with tip in distal SVC. Mediastinum/Nodes: Soft tissue in the LEFT axilla measuring 1.3 by 1.9 cm compares to 1.2 x 2.0 cm for no change from most recent CT. Lesion was an enlarged hypermetabolic mass on comparison PET-CT scan 05/05/2018 No supraclavicular adenopathy. No mediastinal hilar adenopathy no pericardial effusion. Esophagus normal Lungs/Pleura: Mass in lingula measures 3.0 x 2.7 cm compared to 4.2 x 3.2 cm. Visually the lesion looks very similar. Lesion difficult to measure due flattened shape. Favor no significant change. There is a band of perihilar arc like fibrosis in the RIGHT lung unchanged from prior consistent radiation change No new nodularity in LEFT or RIGHT lung. Musculoskeletal: No aggressive osseous lesion. CT ABDOMEN AND PELVIS FINDINGS Hepatobiliary: Low-density lesion in the LEFT hepatic lobe consistent benign cysts. No enhancing lesion. Gallbladder normal  Pancreas: Pancreas is normal. No ductal dilatation. No pancreatic inflammation. Spleen: Normal spleen Adrenals/urinary tract: Adrenal glands and kidneys are normal. The ureters and bladder normal. Stomach/Bowel: Stomach, small bowel, appendix, and cecum are normal. Multiple diverticula of the descending colon and sigmoid colon without acute inflammation. Vascular/Lymphatic: Abdominal aorta is normal caliber with atherosclerotic calcification. There is no retroperitoneal or periportal lymphadenopathy. No pelvic lymphadenopathy. Reproductive: Prostate normal Other: No peritoneal metastasis Musculoskeletal: No aggressive osseous lesion. IMPRESSION: 1. Essentially stable mass within lingula. 2. Stable postradiation change in the RIGHT lung. 3. Stable soft tissue nodularity in the LEFT axilla (metastatic lesion on prior PET-CT scan). 4. No evidence of metastatic progression in thorax. 5. No evidence of metastatic disease in the abdomen pelvis. 6. No skeletal metastasis Electronically Signed   By: Suzy Bouchard M.D.   On: 11/16/2019 13:59   CT Abdomen Pelvis W Contrast  Result Date: 11/16/2019 CLINICAL DATA:  Non-small cell lung cancer. Radiation therapy to lungs complete. Immunotherapy ongoing. Low back pain. EXAM: CT CHEST, ABDOMEN AND PELVIS WITHOUT CONTRAST TECHNIQUE: Multidetector CT imaging of the chest, abdomen and pelvis was performed following the standard protocol without IV contrast. COMPARISON:  None. FINDINGS: CT CHEST FINDINGS Cardiovascular: Coronary artery calcification and aortic atherosclerotic calcification. Port in the anterior chest wall with tip in distal SVC. Mediastinum/Nodes: Soft tissue in the LEFT axilla measuring 1.3 by 1.9 cm compares to 1.2 x 2.0 cm for no change from most recent CT. Lesion was an enlarged hypermetabolic mass on comparison PET-CT scan 05/05/2018 No supraclavicular adenopathy. No mediastinal hilar adenopathy no pericardial effusion. Esophagus normal Lungs/Pleura: Mass in  lingula measures 3.0 x 2.7 cm compared to 4.2 x 3.2 cm. Visually the lesion looks very similar. Lesion difficult to measure due flattened shape. Favor no significant change. There is a band of perihilar arc like fibrosis in the RIGHT lung unchanged from prior consistent radiation change No new nodularity in LEFT or RIGHT lung. Musculoskeletal: No aggressive osseous lesion. CT ABDOMEN AND PELVIS FINDINGS Hepatobiliary: Low-density lesion in the LEFT hepatic lobe consistent benign cysts. No enhancing lesion. Gallbladder normal Pancreas: Pancreas is normal. No ductal dilatation. No pancreatic inflammation. Spleen: Normal spleen Adrenals/urinary tract: Adrenal glands and kidneys are normal. The ureters and bladder normal. Stomach/Bowel: Stomach, small bowel, appendix, and cecum are normal. Multiple diverticula of the descending colon and sigmoid colon without acute inflammation. Vascular/Lymphatic: Abdominal aorta is normal caliber with atherosclerotic calcification. There is no retroperitoneal or periportal lymphadenopathy. No pelvic lymphadenopathy. Reproductive: Prostate normal Other: No peritoneal metastasis Musculoskeletal: No aggressive  osseous lesion. IMPRESSION: 1. Essentially stable mass within lingula. 2. Stable postradiation change in the RIGHT lung. 3. Stable soft tissue nodularity in the LEFT axilla (metastatic lesion on prior PET-CT scan). 4. No evidence of metastatic progression in thorax. 5. No evidence of metastatic disease in the abdomen pelvis. 6. No skeletal metastasis Electronically Signed   By: Suzy Bouchard M.D.   On: 11/16/2019 13:59    ASSESSMENT AND PLAN: This is a very pleasant 74 years old white male with very light most smoking history recently diagnosed with stage IV (T3, N0, M1c) non-small cell lung cancer, squamous cell carcinoma based on the biopsy from the left axillary mass. He status post 4 cycles of induction systemic chemotherapy with carboplatin, paclitaxel and Keytruda with  partial response.  The patient is currently on maintenance treatment with single agent Keytruda status post 24 cycles. He has been tolerating this treatment well with no concerning adverse effects. I recommended for the patient to proceed with cycle #25 of his maintenance treatment today as planned. For the hypothyroidism, he is currently on levothyroxine 150 mcg p.o. daily.  We will continue to monitor his TSH closely and adjust his dose as needed. He will come back for follow-up visit in 3 weeks for evaluation before the next cycle of his treatment. He was advised to call immediately if he has any concerning symptoms in the interval. The patient voices understanding of current disease status and treatment options and is in agreement with the current care plan. All questions were answered. The patient knows to call the clinic with any problems, questions or concerns. We can certainly see the patient much sooner if necessary.  Disclaimer: This note was dictated with voice recognition software. Similar sounding words can inadvertently be transcribed and may not be corrected upon review.

## 2019-12-09 ENCOUNTER — Telehealth: Payer: Self-pay | Admitting: Internal Medicine

## 2019-12-09 NOTE — Telephone Encounter (Signed)
Scheduled per los. Called and left msg. Mailed printout  °

## 2019-12-16 ENCOUNTER — Other Ambulatory Visit: Payer: Self-pay | Admitting: Internal Medicine

## 2019-12-16 DIAGNOSIS — C3491 Malignant neoplasm of unspecified part of right bronchus or lung: Secondary | ICD-10-CM

## 2019-12-22 NOTE — Progress Notes (Signed)
Pharmacist Chemotherapy Monitoring - Follow Up Assessment    I verify that I have reviewed each item in the below checklist:  . Regimen for the patient is scheduled for the appropriate day and plan matches scheduled date. Marland Kitchen Appropriate non-routine labs are ordered dependent on drug ordered. . If applicable, additional medications reviewed and ordered per protocol based on lifetime cumulative doses and/or treatment regimen.   Plan for follow-up and/or issues identified: No . I-vent associated with next due treatment: No . MD and/or nursing notified: No  Alex Mcmanigal D 12/22/2019 4:23 PM

## 2019-12-28 ENCOUNTER — Inpatient Hospital Stay: Payer: Medicare Other | Attending: Oncology | Admitting: Internal Medicine

## 2019-12-28 ENCOUNTER — Inpatient Hospital Stay: Payer: Medicare Other

## 2019-12-28 ENCOUNTER — Other Ambulatory Visit: Payer: Self-pay

## 2019-12-28 ENCOUNTER — Telehealth: Payer: Self-pay | Admitting: Internal Medicine

## 2019-12-28 ENCOUNTER — Encounter: Payer: Self-pay | Admitting: Internal Medicine

## 2019-12-28 VITALS — BP 118/81 | HR 63 | Temp 98.5°F | Resp 18 | Ht 74.0 in | Wt 245.1 lb

## 2019-12-28 DIAGNOSIS — C3491 Malignant neoplasm of unspecified part of right bronchus or lung: Secondary | ICD-10-CM | POA: Diagnosis not present

## 2019-12-28 DIAGNOSIS — Z79899 Other long term (current) drug therapy: Secondary | ICD-10-CM | POA: Diagnosis not present

## 2019-12-28 DIAGNOSIS — C3412 Malignant neoplasm of upper lobe, left bronchus or lung: Secondary | ICD-10-CM | POA: Insufficient documentation

## 2019-12-28 DIAGNOSIS — I10 Essential (primary) hypertension: Secondary | ICD-10-CM | POA: Diagnosis not present

## 2019-12-28 DIAGNOSIS — E039 Hypothyroidism, unspecified: Secondary | ICD-10-CM

## 2019-12-28 DIAGNOSIS — Z95828 Presence of other vascular implants and grafts: Secondary | ICD-10-CM

## 2019-12-28 DIAGNOSIS — Z5112 Encounter for antineoplastic immunotherapy: Secondary | ICD-10-CM | POA: Diagnosis not present

## 2019-12-28 DIAGNOSIS — I2510 Atherosclerotic heart disease of native coronary artery without angina pectoris: Secondary | ICD-10-CM | POA: Diagnosis not present

## 2019-12-28 DIAGNOSIS — E785 Hyperlipidemia, unspecified: Secondary | ICD-10-CM | POA: Insufficient documentation

## 2019-12-28 DIAGNOSIS — Z85828 Personal history of other malignant neoplasm of skin: Secondary | ICD-10-CM | POA: Insufficient documentation

## 2019-12-28 DIAGNOSIS — C773 Secondary and unspecified malignant neoplasm of axilla and upper limb lymph nodes: Secondary | ICD-10-CM | POA: Insufficient documentation

## 2019-12-28 DIAGNOSIS — C7801 Secondary malignant neoplasm of right lung: Secondary | ICD-10-CM

## 2019-12-28 LAB — CBC WITH DIFFERENTIAL (CANCER CENTER ONLY)
Abs Immature Granulocytes: 0.01 10*3/uL (ref 0.00–0.07)
Basophils Absolute: 0 10*3/uL (ref 0.0–0.1)
Basophils Relative: 1 %
Eosinophils Absolute: 0.3 10*3/uL (ref 0.0–0.5)
Eosinophils Relative: 6 %
HCT: 36 % — ABNORMAL LOW (ref 39.0–52.0)
Hemoglobin: 12.3 g/dL — ABNORMAL LOW (ref 13.0–17.0)
Immature Granulocytes: 0 %
Lymphocytes Relative: 21 %
Lymphs Abs: 1.2 10*3/uL (ref 0.7–4.0)
MCH: 31 pg (ref 26.0–34.0)
MCHC: 34.2 g/dL (ref 30.0–36.0)
MCV: 90.7 fL (ref 80.0–100.0)
Monocytes Absolute: 0.8 10*3/uL (ref 0.1–1.0)
Monocytes Relative: 15 %
Neutro Abs: 3.3 10*3/uL (ref 1.7–7.7)
Neutrophils Relative %: 57 %
Platelet Count: 144 10*3/uL — ABNORMAL LOW (ref 150–400)
RBC: 3.97 MIL/uL — ABNORMAL LOW (ref 4.22–5.81)
RDW: 12 % (ref 11.5–15.5)
WBC Count: 5.6 10*3/uL (ref 4.0–10.5)
nRBC: 0 % (ref 0.0–0.2)

## 2019-12-28 LAB — CMP (CANCER CENTER ONLY)
ALT: 16 U/L (ref 0–44)
AST: 19 U/L (ref 15–41)
Albumin: 3.8 g/dL (ref 3.5–5.0)
Alkaline Phosphatase: 60 U/L (ref 38–126)
Anion gap: 7 (ref 5–15)
BUN: 11 mg/dL (ref 8–23)
CO2: 24 mmol/L (ref 22–32)
Calcium: 8.7 mg/dL — ABNORMAL LOW (ref 8.9–10.3)
Chloride: 110 mmol/L (ref 98–111)
Creatinine: 0.93 mg/dL (ref 0.61–1.24)
GFR, Est AFR Am: >60 mL/min (ref >60)
GFR, Estimated: >60 mL/min (ref >60)
Glucose, Bld: 98 mg/dL (ref 70–99)
Potassium: 4.4 mmol/L (ref 3.5–5.1)
Sodium: 141 mmol/L (ref 135–145)
Total Bilirubin: 0.5 mg/dL (ref 0.3–1.2)
Total Protein: 7.2 g/dL (ref 6.5–8.1)

## 2019-12-28 LAB — TSH: TSH: 2.957 u[IU]/mL (ref 0.320–4.118)

## 2019-12-28 MED ORDER — HEPARIN SOD (PORK) LOCK FLUSH 100 UNIT/ML IV SOLN
500.0000 [IU] | Freq: Once | INTRAVENOUS | Status: AC | PRN
  Administered 2019-12-28: 13:00:00 500 [IU]
  Filled 2019-12-28: qty 5

## 2019-12-28 MED ORDER — SODIUM CHLORIDE 0.9 % IV SOLN
200.0000 mg | Freq: Once | INTRAVENOUS | Status: AC
  Administered 2019-12-28: 200 mg via INTRAVENOUS
  Filled 2019-12-28: qty 8

## 2019-12-28 MED ORDER — SODIUM CHLORIDE 0.9% FLUSH
10.0000 mL | INTRAVENOUS | Status: DC | PRN
  Administered 2019-12-28: 10 mL
  Filled 2019-12-28: qty 10

## 2019-12-28 MED ORDER — SODIUM CHLORIDE 0.9 % IV SOLN
Freq: Once | INTRAVENOUS | Status: AC
  Filled 2019-12-28: qty 250

## 2019-12-28 MED ORDER — SODIUM CHLORIDE 0.9% FLUSH
10.0000 mL | Freq: Once | INTRAVENOUS | Status: DC
  Filled 2019-12-28: qty 10

## 2019-12-28 NOTE — Progress Notes (Signed)
[facility] Telephone:([phone]) [phone]   Fax:(336) [phone]  OFFICE PROGRESS NOTE  Travis Norlander, DO [place] [address]  DIAGNOSIS: Stage IV (T3, N0, M1c) non-small cell lung cancer, squamous cell carcinoma presented with large right infrahilar mass in addition to left upper lobe lung nodule as well as left axillary mass with left axillary lymph node diagnosed in August 2019.  PRIOR THERAPY:  1) Palliative radiotherapy to the right infrahilar mass as well as the axillary mass under the care of Dr. Lisbeth Renshaw. 2) Systemic chemotherapy with carboplatin for AUC of 5, paclitaxel 175 mg/M2 and Keytruda 200 mg IV every 3 weeks status post 4 cycles.  CURRENT THERAPY: Maintenance immunotherapy with single agent Keytruda 200 mg IV every 3 weeks status post 25 cycles.  INTERVAL HISTORY: Travis Padilla 74 y.o. male returns to the clinic today for follow-up visit.  The patient is feeling fine today with no concerning complaints.  He denied having any chest pain, shortness of breath, cough or hemoptysis.  He denied having any fever or chills.  He has no nausea, vomiting, diarrhea or constipation.  He has no headache or visual changes.  He continues to tolerate his maintenance treatment with Keytruda fairly well.  The patient is here today for evaluation before starting cycle #26.   MEDICAL HISTORY: Past Medical History:  Diagnosis Date  . Abnormal nuclear stress test    December, 2013  . Anemia   . CAD (coronary artery disease)    Mild nonobstructive plaque in cath 2013  . Chest pain    December, 2013  . Dizziness   . Gout   . Hemorrhoid   . Hyperlipidemia   . Hypertension   . 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 Skin cancer   . Spinal stenosis     ALLERGIES:  is allergic to trazodone and nefazodone.  MEDICATIONS:  Current Outpatient Medications  Medication  Sig Dispense Refill  . Apple Cider Vinegar 500 MG TABS Take by mouth.    . Artificial Tear Solution (SOOTHE XP OP) Place 1 drop into both eyes 2 (two) times daily.    . carvedilol (COREG) 12.5 MG tablet Take 1 tablet (12.5 mg total) by mouth 2 (two) times daily with a meal. 180 tablet 3  . Cyanocobalamin (VITAMIN B 12) 250 MCG LOZG Take by mouth.    . diphenhydramine-acetaminophen (TYLENOL PM) 25-500 MG TABS tablet Take 1-2 tablets by mouth at bedtime as needed (pain).    . ferrous sulfate 324 MG TBEC Take 324 mg by mouth daily with breakfast.    . levothyroxine (SYNTHROID) 150 MCG tablet TAKE 1 TABLET BY MOUTH EVERY DAY BEFORE BREAKFAST 30 tablet 0  . lidocaine-prilocaine (EMLA) cream Apply 1 application topically as needed. 30 g 1  . methocarbamol (ROBAXIN) 500 MG tablet Take 500 mg by mouth daily.    . Multiple Vitamins-Minerals (PRESERVISION AREDS 2 PO) Take by mouth.    . senna-docusate (SENNA S) 8.6-50 MG tablet 1 to 2 twice daily for constipation 120 tablet 5  . traZODone (DESYREL) 50 MG tablet TAKE 0.5-1 TABLETS (25-50 MG TOTAL) BY MOUTH AT BEDTIME AS NEEDED FOR SLEEP. 90 tablet 0  . albuterol (PROVENTIL) (2.5 MG/3ML) 0.083% nebulizer solution Take 3 mLs (2.5 mg total) by nebulization every 6 (six) hours as needed for wheezing or shortness of breath. (Patient  not taking: Reported on 11/03/2019) 75 mL 12  . ALPRAZolam (XANAX) 0.25 MG tablet Take 1 tablet (0.25 mg total) by mouth at bedtime as needed for anxiety. (Patient not taking: Reported on 12/28/2019) 15 tablet 0  . hydrOXYzine (ATARAX/VISTARIL) 10 MG tablet Take 1 tablet (10 mg total) by mouth 3 (three) times daily as needed for itching. (Patient not taking: Reported on 12/28/2019) 30 tablet 2  . mirtazapine (REMERON) 15 MG tablet Take 15 mg by mouth at bedtime.    Marland Kitchen oxyCODONE-acetaminophen (PERCOCET/ROXICET) 5-325 MG tablet Take 1 tablet by mouth every 8 (eight) hours as needed for severe pain. (Patient not taking: Reported on 12/28/2019)  30 tablet 0  . pravastatin (PRAVACHOL) 40 MG tablet Take 1 tablet (40 mg total) by mouth every evening. 90 tablet 3   No current facility-administered medications for this visit.    SURGICAL HISTORY:  Past Surgical History:  Procedure Laterality Date  . 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 BELPHAROPTOSIS REPAIR Bilateral   . CATARACT EXTRACTION W/ INTRAOCULAR LENS  IMPLANT, BILATERAL    . COLONOSCOPY N/A 11/03/2014   Procedure: COLONOSCOPY;  Surgeon: Rogene Houston, MD;  Location: [facility];  Service: Endoscopy;  Laterality: N/A;  1225  . IR IMAGING GUIDED PORT INSERTION  07/11/2018  . KNEE CARTILAGE SURGERY Left    Left knee  . SKIN CANCER EXCISION  12/2014, 04/25/15  . VIDEO BRONCHOSCOPY Bilateral 05/09/2018   Procedure: VIDEO BRONCHOSCOPY WITH FLUORO;  Surgeon: Juanito Doom, MD;  Location: [facility];  Service: Cardiopulmonary;  Laterality: Bilateral;    REVIEW OF SYSTEMS:  A comprehensive review of systems was negative.   PHYSICAL EXAMINATION: General appearance: alert, cooperative and no distress Head: Normocephalic, without obvious abnormality, atraumatic Neck: no adenopathy, no JVD, supple, symmetrical, trachea midline and thyroid not enlarged, symmetric, no tenderness/mass/nodules Lymph nodes: Cervical, supraclavicular, and axillary nodes normal. Resp: clear to auscultation bilaterally Back: symmetric, no curvature. ROM normal. No CVA tenderness. Cardio: regular rate and rhythm, S1, S2 normal, no murmur, click, rub or gallop GI: soft, non-tender; bowel sounds normal; no masses,  no organomegaly Extremities: extremities normal, atraumatic, no cyanosis or edema  ECOG PERFORMANCE STATUS: 1 - Symptomatic but completely ambulatory  Blood pressure 118/81, pulse 63, temperature 98.5 F (36.9 C), temperature source Temporal, resp. rate 18, height 6\' 2"  (1.88 m), weight 245 lb 1.6 oz (111.2 kg), SpO2 99 %.  LABORATORY DATA: Lab Results  Component Value Date   WBC 5.6  12/28/2019   HGB 12.3 (L) 12/28/2019   HCT 36.0 (L) 12/28/2019   MCV 90.7 12/28/2019   PLT 144 (L) 12/28/2019      Chemistry      Component Value Date/Time   NA 142 12/08/2019 0941   NA 144 04/09/2018 1507   K 4.2 12/08/2019 0941   CL 110 12/08/2019 0941   CO2 23 12/08/2019 0941   BUN 13 12/08/2019 0941   BUN 15 04/09/2018 1507   CREATININE 0.95 12/08/2019 0941      Component Value Date/Time   CALCIUM 9.1 12/08/2019 0941   ALKPHOS 55 12/08/2019 0941   AST 17 12/08/2019 0941   ALT 15 12/08/2019 0941   BILITOT 0.6 12/08/2019 0941       RADIOGRAPHIC STUDIES: No results found.  ASSESSMENT AND PLAN: This is a very pleasant 74 years old white male with very light most smoking history recently diagnosed with stage IV (T3, N0, M1c) non-small cell lung cancer, squamous cell carcinoma  based on the biopsy from the left axillary mass. He status post 4 cycles of induction systemic chemotherapy with carboplatin, paclitaxel and Keytruda with partial response.  The patient is currently on maintenance treatment with single agent Keytruda status post 25 cycles. The patient continues to tolerate his treatment well with no concerning adverse effects. I recommended for him to proceed with cycle #26 today as planned. I will see him back for follow-up visit in 3 weeks for evaluation before the next cycle of his treatment. For the hypothyroidism, he is currently on levothyroxine 150 mcg p.o. daily.  We will continue to monitor his TSH closely. The patient was advised to call immediately if he has any concerning symptoms in the interval.  The patient voices understanding of current disease status and treatment options and is in agreement with the current care plan. All questions were answered. The patient knows to call the clinic with any problems, questions or concerns. We can certainly see the patient much sooner if necessary.  Disclaimer: This note was dictated with voice recognition software.  Similar sounding words can inadvertently be transcribed and may not be corrected upon review.

## 2019-12-28 NOTE — Patient Instructions (Signed)
Pembrolizumab injection What is this medicine? PEMBROLIZUMAB (pem broe liz ue mab) is a monoclonal antibody. It is used to treat certain types of cancer. This medicine may be used for other purposes; ask your health care provider or pharmacist if you have questions. COMMON BRAND NAME(S): Keytruda What should I tell my health care provider before I take this medicine? They need to know if you have any of these conditions:  diabetes  immune system problems  inflammatory bowel disease  liver disease  lung or breathing disease  lupus  received or scheduled to receive an organ transplant or a stem-cell transplant that uses donor stem cells  an unusual or allergic reaction to pembrolizumab, other medicines, foods, dyes, or preservatives  pregnant or trying to get pregnant  breast-feeding How should I use this medicine? This medicine is for infusion into a vein. It is given by a health care professional in a hospital or clinic setting. A special MedGuide will be given to you before each treatment. Be sure to read this information carefully each time. Talk to your pediatrician regarding the use of this medicine in children. While this drug may be prescribed for children as young as 6 months for selected conditions, precautions do apply. Overdosage: If you think you have taken too much of this medicine contact a poison control center or emergency room at once. NOTE: This medicine is only for you. Do not share this medicine with others. What if I miss a dose? It is important not to miss your dose. Call your doctor or health care professional if you are unable to keep an appointment. What may interact with this medicine? Interactions have not been studied. Give your health care provider a list of all the medicines, herbs, non-prescription drugs, or dietary supplements you use. Also tell them if you smoke, drink alcohol, or use illegal drugs. Some items may interact with your medicine. This  list may not describe all possible interactions. Give your health care provider a list of all the medicines, herbs, non-prescription drugs, or dietary supplements you use. Also tell them if you smoke, drink alcohol, or use illegal drugs. Some items may interact with your medicine. What should I watch for while using this medicine? Your condition will be monitored carefully while you are receiving this medicine. You may need blood work done while you are taking this medicine. Do not become pregnant while taking this medicine or for 4 months after stopping it. Women should inform their doctor if they wish to become pregnant or think they might be pregnant. There is a potential for serious side effects to an unborn child. Talk to your health care professional or pharmacist for more information. Do not breast-feed an infant while taking this medicine or for 4 months after the last dose. What side effects may I notice from receiving this medicine? Side effects that you should report to your doctor or health care professional as soon as possible:  allergic reactions like skin rash, itching or hives, swelling of the face, lips, or tongue  bloody or black, tarry  breathing problems  changes in vision  chest pain  chills  confusion  constipation  cough  diarrhea  dizziness or feeling faint or lightheaded  fast or irregular heartbeat  fever  flushing  joint pain  low blood counts - this medicine may decrease the number of white blood cells, red blood cells and platelets. You may be at increased risk for infections and bleeding.  muscle pain  muscle   weakness  pain, tingling, numbness in the hands or feet  persistent headache  redness, blistering, peeling or loosening of the skin, including inside the mouth  signs and symptoms of high blood sugar such as dizziness; dry mouth; dry skin; fruity breath; nausea; stomach pain; increased hunger or thirst; increased urination  signs  and symptoms of kidney injury like trouble passing urine or change in the amount of urine  signs and symptoms of liver injury like dark urine, light-colored stools, loss of appetite, nausea, right upper belly pain, yellowing of the eyes or skin  sweating  swollen lymph nodes  weight loss Side effects that usually do not require medical attention (report to your doctor or health care professional if they continue or are bothersome):  decreased appetite  hair loss  muscle pain  tiredness This list may not describe all possible side effects. Call your doctor for medical advice about side effects. You may report side effects to FDA at 1-800-FDA-1088. Where should I keep my medicine? This drug is given in a hospital or clinic and will not be stored at home. NOTE: This sheet is a summary. It may not cover all possible information. If you have questions about this medicine, talk to your doctor, pharmacist, or health care provider.  2020 Elsevier/Gold Standard (2019-07-03 18:07:58)  

## 2019-12-28 NOTE — Telephone Encounter (Signed)
Scheduled per 04/19 los, patient has received calender

## 2020-01-05 NOTE — Progress Notes (Signed)
Telephone visit  Subjective: CC: insomnia, URI symptoms PCP: [name], DO VPX:TGGY Travis Padilla is a 74 y.o. male calls for telephone consult today. Patient provides verbal consent for consult held via phone.  Due to COVID-19 pandemic this visit was conducted virtually. This visit type was conducted due to national recommendations for restrictions regarding the COVID-19 Pandemic (e.g. social distancing, sheltering in place) in an effort to limit this patient's exposure and mitigate transmission in our community. All issues noted in this document were discussed and addressed.  A physical exam was not performed with this format.   Location of patient: home Location of provider: Working remotely from home Others present for call: none  1.  Insomnia Patient notes that his sleep has improved some with the trazodone 50 mg nightly.  He is not taking mirtazapine.  He has had no concerning symptoms for excessive serotonin.  He has felt that over the last couple of weeks he has not been getting as good sleep as he was.  He wonders if he is may be developing a tolerance to the trazodone.  Does not report any excessive daytime sleepiness, falls or confusion.  2. URI Patient has had both COVID shots.  He reports productive coughing, sore throat, rhinorrhea, myalgia that onset last Thursday.  He has been using cough medication to calm his throat.  No fever.  He has been using tylenol.  He reports sinus congestion.  Occasional purulent material.  No hemoptysis. No shortness of breath.   ROS: Per HPI  No Active Allergies Past Medical History:  Diagnosis Date  . Abnormal nuclear stress test    December, 2013  . Anemia   . CAD (coronary artery disease)    Mild nonobstructive plaque in cath 2013  . Chest pain    December, 2013  . Dizziness   . Gout   . Hemorrhoid   . Hyperlipidemia   . Hypertension   . 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 Skin cancer   . Spinal stenosis     Current Outpatient Medications:  .  Apple Cider Vinegar 500 MG TABS, Take by mouth., Disp: , Rfl:  .  Artificial Tear Solution (SOOTHE XP OP), Place 1 drop into both eyes 2 (two) times daily., Disp: , Rfl:  .  benzonatate (TESSALON) 200 MG capsule, Take 1 capsule (200 mg total) by mouth 2 (two) times daily as needed for cough., Disp: 20 capsule, Rfl: 0 .  carvedilol (COREG) 12.5 MG tablet, Take 1 tablet (12.5 mg total) by mouth 2 (two) times daily with a meal., Disp: 180 tablet, Rfl: 3 .  cefdinir (OMNICEF) 300 MG capsule, Take 1 capsule (300 mg total) by mouth 2 (two) times daily. 1 po BID, Disp: 20 capsule, Rfl: 0 .  Cyanocobalamin (VITAMIN B 12) 250 MCG LOZG, Take by mouth., Disp: , Rfl:  .  diphenhydramine-acetaminophen (TYLENOL PM) 25-500 MG TABS tablet, Take 1-2 tablets by mouth at bedtime as needed (pain)., Disp: , Rfl:  .  ferrous sulfate 324 MG TBEC, Take 324 mg by mouth daily with breakfast., Disp: , Rfl:  .  hydrOXYzine (ATARAX/VISTARIL) 10 MG tablet, Take 1 tablet (10 mg total) by mouth 3 (three) times daily as needed for itching. (Patient not taking: Reported on 12/28/2019), Disp: 30 tablet, Rfl: 2 .  levothyroxine (SYNTHROID) 150 MCG tablet, TAKE 1 TABLET BY MOUTH EVERY DAY BEFORE  BREAKFAST, Disp: 30 tablet, Rfl: 0 .  lidocaine-prilocaine (EMLA) cream, Apply 1 application topically as needed., Disp: 30 g, Rfl: 1 .  methocarbamol (ROBAXIN) 500 MG tablet, Take 500 mg by mouth daily., Disp: , Rfl:  .  Multiple Vitamins-Minerals (PRESERVISION AREDS 2 PO), Take by mouth., Disp: , Rfl:  .  oxyCODONE-acetaminophen (PERCOCET/ROXICET) 5-325 MG tablet, Take 1 tablet by mouth every 8 (eight) hours as needed for severe pain. (Patient not taking: Reported on 12/28/2019), Disp: 30 tablet, Rfl: 0 .  pravastatin (PRAVACHOL) 40 MG tablet, Take 1 tablet (40 mg total) by mouth every evening., Disp: 90 tablet, Rfl: 3 .   senna-docusate (SENNA S) 8.6-50 MG tablet, 1 to 2 twice daily for constipation, Disp: 120 tablet, Rfl: 5 .  traZODone (DESYREL) 50 MG tablet, Take 1-2 tablets (50-100 mg total) by mouth at bedtime as needed for sleep., Disp: 180 tablet, Rfl: 0  Assessment/ Plan: 74 y.o. male   1. Primary insomnia May increase trazodone to 1-1/2 to 2 tablets at bedtime if needed.  Advised to use lowest effective dose.  Follow-up as needed on this issue - traZODone (DESYREL) 50 MG tablet; Take 1-2 tablets (50-100 mg total) by mouth at bedtime as needed for sleep.  Dispense: 180 tablet; Refill: 0  2. Upper respiratory tract infection, unspecified type I did stress that he can have Covid infection despite having had Covid vaccine but would experience a milder case.  Did encourage to go ahead and be tested for COVID-19.  In the interim, I am going to empirically treat him with oral antibiotics, Tessalon Perles.  Advised to use oral antihistamine OTC.  We discussed red flag signs and symptoms warranting further evaluation and he voiced good understanding will follow up as needed - benzonatate (TESSALON) 200 MG capsule; Take 1 capsule (200 mg total) by mouth 2 (two) times daily as needed for cough.  Dispense: 20 capsule; Refill: 0 - cefdinir (OMNICEF) 300 MG capsule; Take 1 capsule (300 mg total) by mouth 2 (two) times daily. 1 po BID  Dispense: 20 capsule; Refill: 0   Start time: 10:26am End time: 10:38am  Total time spent on patient care (including telephone call/ virtual visit): 20 minutes  [place], [place] [phone]

## 2020-01-06 ENCOUNTER — Encounter: Payer: Self-pay | Admitting: Family Medicine

## 2020-01-06 ENCOUNTER — Ambulatory Visit: Payer: Medicare Other | Attending: Internal Medicine

## 2020-01-06 ENCOUNTER — Ambulatory Visit (INDEPENDENT_AMBULATORY_CARE_PROVIDER_SITE_OTHER): Payer: Medicare Other | Admitting: Family Medicine

## 2020-01-06 DIAGNOSIS — F5101 Primary insomnia: Secondary | ICD-10-CM

## 2020-01-06 DIAGNOSIS — Z20822 Contact with and (suspected) exposure to covid-19: Secondary | ICD-10-CM | POA: Diagnosis not present

## 2020-01-06 DIAGNOSIS — J069 Acute upper respiratory infection, unspecified: Secondary | ICD-10-CM

## 2020-01-06 MED ORDER — BENZONATATE 200 MG PO CAPS: 200.0000 mg | ORAL_CAPSULE | Freq: Two times a day (BID) | ORAL | 0 refills | Status: DC | PRN

## 2020-01-06 MED ORDER — CEFDINIR 300 MG PO CAPS: 300.0000 mg | ORAL_CAPSULE | Freq: Two times a day (BID) | ORAL | 0 refills | Status: DC

## 2020-01-06 MED ORDER — TRAZODONE HCL 50 MG PO TABS: 50.0000 mg | ORAL_TABLET | Freq: Every evening | ORAL | 0 refills | Status: DC | PRN

## 2020-01-06 NOTE — Patient Instructions (Addendum)
 -   Get plenty of rest and drink plenty of fluids. - Try to breathe moist air. Use a cold mist humidifier. - Consume warm fluids (soup or tea) to provide relief for a stuffy nose and to loosen phlegm. - For nasal stuffiness, try saline nasal spray or a Neti Pot. Afrin nasal spray can also be used but this product should not be used longer than 3 days or it will cause rebound nasal stuffiness (worsening nasal congestion). - For sore throat pain relief: use chloraseptic spray, suck on throat lozenges, hard candy or popsicles; gargle with warm salt water (1/4 tsp. salt per 8 oz. of water); and eat soft, bland foods. - Eat a well-balanced diet. If you cannot, ensure you are getting enough nutrients by taking a daily multivitamin. - Avoid dairy products, as they can thicken phlegm. - Avoid alcohol, as it impairs your body's immune system.  CONTACT YOUR DOCTOR IF YOU EXPERIENCE ANY OF THE FOLLOWING: - High fever - Ear pain - Sinus-type headache - Unusually severe cold symptoms - Cough that gets worse while other cold symptoms improve - Flare up of any chronic lung problem, such as asthma - Your symptoms persist longer than 2 weeks

## 2020-01-07 LAB — SARS-COV-2, NAA 2 DAY TAT

## 2020-01-07 LAB — NOVEL CORONAVIRUS, NAA: SARS-CoV-2, NAA: NOT DETECTED

## 2020-01-08 ENCOUNTER — Other Ambulatory Visit: Payer: Self-pay | Admitting: Internal Medicine

## 2020-01-08 DIAGNOSIS — C3491 Malignant neoplasm of unspecified part of right bronchus or lung: Secondary | ICD-10-CM

## 2020-01-13 NOTE — Progress Notes (Signed)
Pharmacist Chemotherapy Monitoring - Follow Up Assessment    I verify that I have reviewed each item in the below checklist:  . Regimen for the patient is scheduled for the appropriate day and plan matches scheduled date. Marland Kitchen Appropriate non-routine labs are ordered dependent on drug ordered. . If applicable, additional medications reviewed and ordered per protocol based on lifetime cumulative doses and/or treatment regimen.   Plan for follow-up and/or issues identified: No . I-vent associated with next due treatment: No . MD and/or nursing notified: No  Carliss Quast K 01/13/2020 9:51 AM

## 2020-01-19 ENCOUNTER — Encounter: Payer: Self-pay | Admitting: Internal Medicine

## 2020-01-19 ENCOUNTER — Other Ambulatory Visit: Payer: Self-pay

## 2020-01-19 ENCOUNTER — Encounter: Payer: Self-pay | Admitting: *Deleted

## 2020-01-19 ENCOUNTER — Inpatient Hospital Stay: Payer: Medicare Other

## 2020-01-19 ENCOUNTER — Inpatient Hospital Stay: Payer: Medicare Other | Attending: Oncology | Admitting: Internal Medicine

## 2020-01-19 VITALS — BP 118/88 | HR 72 | Temp 98.2°F | Resp 18 | Ht 74.0 in | Wt 242.2 lb

## 2020-01-19 DIAGNOSIS — Z5112 Encounter for antineoplastic immunotherapy: Secondary | ICD-10-CM | POA: Insufficient documentation

## 2020-01-19 DIAGNOSIS — C3491 Malignant neoplasm of unspecified part of right bronchus or lung: Secondary | ICD-10-CM

## 2020-01-19 DIAGNOSIS — Z95828 Presence of other vascular implants and grafts: Secondary | ICD-10-CM

## 2020-01-19 DIAGNOSIS — E039 Hypothyroidism, unspecified: Secondary | ICD-10-CM | POA: Diagnosis not present

## 2020-01-19 DIAGNOSIS — E785 Hyperlipidemia, unspecified: Secondary | ICD-10-CM | POA: Diagnosis not present

## 2020-01-19 DIAGNOSIS — Z85828 Personal history of other malignant neoplasm of skin: Secondary | ICD-10-CM | POA: Insufficient documentation

## 2020-01-19 DIAGNOSIS — I2510 Atherosclerotic heart disease of native coronary artery without angina pectoris: Secondary | ICD-10-CM | POA: Insufficient documentation

## 2020-01-19 DIAGNOSIS — C773 Secondary and unspecified malignant neoplasm of axilla and upper limb lymph nodes: Secondary | ICD-10-CM | POA: Diagnosis not present

## 2020-01-19 DIAGNOSIS — C3412 Malignant neoplasm of upper lobe, left bronchus or lung: Secondary | ICD-10-CM | POA: Diagnosis not present

## 2020-01-19 DIAGNOSIS — Z79899 Other long term (current) drug therapy: Secondary | ICD-10-CM | POA: Insufficient documentation

## 2020-01-19 DIAGNOSIS — C7801 Secondary malignant neoplasm of right lung: Secondary | ICD-10-CM

## 2020-01-19 DIAGNOSIS — I10 Essential (primary) hypertension: Secondary | ICD-10-CM | POA: Diagnosis not present

## 2020-01-19 LAB — CBC WITH DIFFERENTIAL (CANCER CENTER ONLY)
Abs Immature Granulocytes: 0.03 10*3/uL (ref 0.00–0.07)
Basophils Absolute: 0.1 10*3/uL (ref 0.0–0.1)
Basophils Relative: 1 %
Eosinophils Absolute: 0.4 10*3/uL (ref 0.0–0.5)
Eosinophils Relative: 7 %
HCT: 36.5 % — ABNORMAL LOW (ref 39.0–52.0)
Hemoglobin: 12.4 g/dL — ABNORMAL LOW (ref 13.0–17.0)
Immature Granulocytes: 1 %
Lymphocytes Relative: 21 %
Lymphs Abs: 1.1 10*3/uL (ref 0.7–4.0)
MCH: 31.1 pg (ref 26.0–34.0)
MCHC: 34 g/dL (ref 30.0–36.0)
MCV: 91.5 fL (ref 80.0–100.0)
Monocytes Absolute: 0.7 10*3/uL (ref 0.1–1.0)
Monocytes Relative: 14 %
Neutro Abs: 2.9 10*3/uL (ref 1.7–7.7)
Neutrophils Relative %: 56 %
Platelet Count: 155 10*3/uL (ref 150–400)
RBC: 3.99 MIL/uL — ABNORMAL LOW (ref 4.22–5.81)
RDW: 11.9 % (ref 11.5–15.5)
WBC Count: 5.2 10*3/uL (ref 4.0–10.5)
nRBC: 0 % (ref 0.0–0.2)

## 2020-01-19 LAB — CMP (CANCER CENTER ONLY)
ALT: 13 U/L (ref 0–44)
AST: 17 U/L (ref 15–41)
Albumin: 3.9 g/dL (ref 3.5–5.0)
Alkaline Phosphatase: 62 U/L (ref 38–126)
Anion gap: 9 (ref 5–15)
BUN: 14 mg/dL (ref 8–23)
CO2: 24 mmol/L (ref 22–32)
Calcium: 9.1 mg/dL (ref 8.9–10.3)
Chloride: 109 mmol/L (ref 98–111)
Creatinine: 0.93 mg/dL (ref 0.61–1.24)
GFR, Est AFR Am: >60 mL/min (ref >60)
GFR, Estimated: >60 mL/min (ref >60)
Glucose, Bld: 99 mg/dL (ref 70–99)
Potassium: 4.2 mmol/L (ref 3.5–5.1)
Sodium: 142 mmol/L (ref 135–145)
Total Bilirubin: 0.4 mg/dL (ref 0.3–1.2)
Total Protein: 7.5 g/dL (ref 6.5–8.1)

## 2020-01-19 LAB — TSH: TSH: 4.146 u[IU]/mL — ABNORMAL HIGH (ref 0.320–4.118)

## 2020-01-19 MED ORDER — SODIUM CHLORIDE 0.9% FLUSH
10.0000 mL | INTRAVENOUS | Status: DC | PRN
  Administered 2020-01-19: 10 mL
  Filled 2020-01-19: qty 10

## 2020-01-19 MED ORDER — SODIUM CHLORIDE 0.9 % IV SOLN
Freq: Once | INTRAVENOUS | Status: AC
  Filled 2020-01-19: qty 250

## 2020-01-19 MED ORDER — SODIUM CHLORIDE 0.9 % IV SOLN
200.0000 mg | Freq: Once | INTRAVENOUS | Status: AC
  Administered 2020-01-19: 200 mg via INTRAVENOUS
  Filled 2020-01-19: qty 8

## 2020-01-19 MED ORDER — SODIUM CHLORIDE 0.9% FLUSH
10.0000 mL | Freq: Once | INTRAVENOUS | Status: AC
  Administered 2020-01-19: 10 mL
  Filled 2020-01-19: qty 10

## 2020-01-19 MED ORDER — HEPARIN SOD (PORK) LOCK FLUSH 100 UNIT/ML IV SOLN
500.0000 [IU] | Freq: Once | INTRAVENOUS | Status: AC | PRN
  Administered 2020-01-19: 500 [IU]
  Filled 2020-01-19: qty 5

## 2020-01-19 NOTE — Progress Notes (Signed)
Oncology Nurse Navigator Documentation  Oncology Nurse Navigator Flowsheets 01/19/2020  Abnormal Finding Date -  Confirmed Diagnosis Date -  Navigator Location [facility]  Referral Date to RadOnc/MedOnc -  Navigator Encounter Type Follow-up Appt/I spoke with Travis Padilla today.  He updated Dr. Julien Nordmann and myself that his wife has lymphoma and will be getting treatment.  He did not express any needs at this time but I did notify CSW that patient and wife are going through a lot right now.    Telephone -  Patient Visit Type MedOnc  Treatment Phase Treatment  Barriers/Navigation Needs Family Concerns  Education -  Interventions Psycho-Social Support;Referrals  Acuity Level 2-Minimal Needs (1-2 Barriers Identified)  Referrals Social Work  Coordination of Care -  Education Method -  Time Spent with Patient 30

## 2020-01-19 NOTE — Progress Notes (Signed)
[place] [phone]   [phone]  OFFICE PROGRESS NOTE  [name], DO [place] [address]  DIAGNOSIS: Stage IV (T3, N0, M1c) non-small cell lung cancer, squamous cell carcinoma presented with large right infrahilar mass in addition to left upper lobe lung nodule as well as left axillary mass with left axillary lymph node diagnosed in August 2019.  PRIOR THERAPY:  1) Palliative radiotherapy to the right infrahilar mass as well as the axillary mass under the care of Dr. Lisbeth Renshaw. 2) Systemic chemotherapy with carboplatin for AUC of 5, paclitaxel 175 mg/M2 and Keytruda 200 mg IV every 3 weeks status post 4 cycles.  CURRENT THERAPY: Maintenance immunotherapy with single agent Keytruda 200 mg IV every 3 weeks status post 26 cycles.  INTERVAL HISTORY: Travis Padilla 74 y.o. male returns to the clinic today for follow-up visit.  The patient is feeling fine today with no concerning complaints except for mild fatigue.  He was treated recently for bronchitis and questionable respiratory viral infection.  He completed a course of antibiotics recently.  He denied having any current chest pain, shortness of breath, cough or hemoptysis.  He denied having any fever or chills.  He has no nausea, vomiting, diarrhea or constipation.  His wife was recently diagnosed with non-Hodgkin lymphoma and expected to start treatment soon.  The patient is here today for evaluation before starting cycle #27 of his maintenance treatment with Keytruda.  MEDICAL HISTORY: Past Medical History:  Diagnosis Date  . Abnormal nuclear stress test    December, 2013  . Anemia   . CAD (coronary artery disease)    Mild nonobstructive plaque in cath 2013  . Chest pain    December, 2013  . Dizziness   . Gout   . Hemorrhoid   . Hyperlipidemia   . Hypertension   . 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 Skin cancer   . Spinal stenosis     ALLERGIES:  has no active allergies.  MEDICATIONS:  Current Outpatient Medications  Medication Sig Dispense Refill  . Apple Cider Vinegar 500 MG TABS Take by mouth.    . Artificial Tear Solution (SOOTHE XP OP) Place 1 drop into both eyes 2 (two) times daily.    . benzonatate (TESSALON) 200 MG capsule Take 1 capsule (200 mg total) by mouth 2 (two) times daily as needed for cough. 20 capsule 0  . carvedilol (COREG) 12.5 MG tablet Take 1 tablet (12.5 mg total) by mouth 2 (two) times daily with a meal. 180 tablet 3  . cefdinir (OMNICEF) 300 MG capsule Take 1 capsule (300 mg total) by mouth 2 (two) times daily. 1 po BID 20 capsule 0  . Cyanocobalamin (VITAMIN B 12) 250 MCG LOZG Take by mouth.    . diphenhydramine-acetaminophen (TYLENOL PM) 25-500 MG TABS tablet Take 1-2 tablets by mouth at bedtime as needed (pain).    . ferrous sulfate 324 MG TBEC Take 324 mg by mouth daily with breakfast.    . hydrOXYzine (ATARAX/VISTARIL) 10 MG tablet Take 1 tablet (10 mg total) by mouth 3 (three) times daily as needed for itching. (Patient not taking: Reported on 12/28/2019) 30 tablet 2  . levothyroxine (SYNTHROID) 150 MCG tablet TAKE 1 TABLET BY MOUTH EVERY DAY BEFORE BREAKFAST 30 tablet 0  . lidocaine-prilocaine (EMLA) cream Apply 1 application  topically as needed. 30 g 1  . methocarbamol (ROBAXIN) 500 MG tablet Take 500 mg by mouth daily.    . Multiple Vitamins-Minerals (PRESERVISION AREDS 2 PO) Take by mouth.    . oxyCODONE-acetaminophen (PERCOCET/ROXICET) 5-325 MG tablet Take 1 tablet by mouth every 8 (eight) hours as needed for severe pain. (Patient not taking: Reported on 12/28/2019) 30 tablet 0  . pravastatin (PRAVACHOL) 40 MG tablet Take 1 tablet (40 mg total) by mouth every evening. 90 tablet 3  . senna-docusate (SENNA S) 8.6-50 MG tablet 1 to 2 twice daily for constipation 120 tablet 5  . traZODone (DESYREL) 50 MG tablet Take 1-2  tablets (50-100 mg total) by mouth at bedtime as needed for sleep. 180 tablet 0   No current facility-administered medications for this visit.    SURGICAL HISTORY:  Past Surgical History:  Procedure Laterality Date  . 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 BELPHAROPTOSIS REPAIR Bilateral   . CATARACT EXTRACTION W/ INTRAOCULAR LENS  IMPLANT, BILATERAL    . COLONOSCOPY N/A 11/03/2014   Procedure: COLONOSCOPY;  Surgeon: Rogene Houston, MD;  [facility];  Service: Endoscopy;  Laterality: N/A;  1225  . IR IMAGING GUIDED PORT INSERTION  07/11/2018  . KNEE CARTILAGE SURGERY Left    Left knee  . SKIN CANCER EXCISION  12/2014, 04/25/15  . VIDEO BRONCHOSCOPY Bilateral 05/09/2018   Procedure: VIDEO BRONCHOSCOPY WITH FLUORO;  Surgeon: Juanito Doom, MD;  Location: [facility];  Service: Cardiopulmonary;  Laterality: Bilateral;    REVIEW OF SYSTEMS:  A comprehensive review of systems was negative except for: Constitutional: positive for fatigue   PHYSICAL EXAMINATION: General appearance: alert, cooperative and no distress Head: Normocephalic, without obvious abnormality, atraumatic Neck: no adenopathy, no JVD, supple, symmetrical, trachea midline and thyroid not enlarged, symmetric, no tenderness/mass/nodules Lymph nodes: Cervical, supraclavicular, and axillary nodes normal. Resp: clear to auscultation bilaterally Back: symmetric, no curvature. ROM normal. No CVA tenderness. Cardio: regular rate and rhythm, S1, S2 normal, no murmur, click, rub or gallop GI: soft, non-tender; bowel sounds normal; no masses,  no organomegaly Extremities: extremities normal, atraumatic, no cyanosis or edema  ECOG PERFORMANCE STATUS: 1 - Symptomatic but completely ambulatory  Blood pressure 118/88, pulse 72, temperature 98.2 F (36.8 C), temperature source Temporal, resp. rate 18, height 6\' 2"  (1.88 m), weight 242 lb 3.2 oz (109.9 kg), SpO2 100 %.  LABORATORY DATA: Lab Results  Component Value  Date   WBC 5.2 01/19/2020   HGB 12.4 (L) 01/19/2020   HCT 36.5 (L) 01/19/2020   MCV 91.5 01/19/2020   PLT 155 01/19/2020      Chemistry      Component Value Date/Time   NA 141 12/28/2019 1120   NA 144 04/09/2018 1507   K 4.4 12/28/2019 1120   CL 110 12/28/2019 1120   CO2 24 12/28/2019 1120   BUN 11 12/28/2019 1120   BUN 15 04/09/2018 1507   CREATININE 0.93 12/28/2019 1120      Component Value Date/Time   CALCIUM 8.7 (L) 12/28/2019 1120   ALKPHOS 60 12/28/2019 1120   AST 19 12/28/2019 1120   ALT 16 12/28/2019 1120   BILITOT 0.5 12/28/2019 1120       RADIOGRAPHIC STUDIES: No results found.  ASSESSMENT AND PLAN: This is a very pleasant 74 years old white male with very light most smoking history recently diagnosed with stage IV (T3, N0, M1c) non-small cell lung cancer, squamous cell carcinoma based on the biopsy  from the left axillary mass. He status post 4 cycles of induction systemic chemotherapy with carboplatin, paclitaxel and Keytruda with partial response.  The patient is currently on maintenance treatment with single agent Keytruda status post 26 cycles. The patient continues to tolerate his treatment well with no concerning adverse effects. I recommended for him to proceed with cycle #27 today as planned. For the hypothyroidism, he is currently on levothyroxine 150 mcg p.o. daily.  We will continue to monitor his TSH closely. I will see him back for follow-up visit in 3 weeks for evaluation before the next cycle of his treatment. He was advised to call immediately if he has any concerning symptoms in the interval. The patient voices understanding of current disease status and treatment options and is in agreement with the current care plan. All questions were answered. The patient knows to call the clinic with any problems, questions or concerns. We can certainly see the patient much sooner if necessary.  Disclaimer: This note was dictated with voice recognition  software. Similar sounding words can inadvertently be transcribed and may not be corrected upon review.

## 2020-01-19 NOTE — Patient Instructions (Signed)
[facility] Discharge Instructions for Patients Receiving Chemotherapy/Immunotherapy  Today you received the following chemotherapy/immunotherapy agents :  Pembrolizumab.  To help prevent nausea and vomiting after your treatment, we encourage you to take your nausea medication as prescribed.   If you develop nausea and vomiting that is not controlled by your nausea medication, call the clinic.   BELOW ARE SYMPTOMS THAT SHOULD BE REPORTED IMMEDIATELY:  *FEVER GREATER THAN 100.5 F  *CHILLS WITH OR WITHOUT FEVER  NAUSEA AND VOMITING THAT IS NOT CONTROLLED WITH YOUR NAUSEA MEDICATION  *UNUSUAL SHORTNESS OF BREATH  *UNUSUAL BRUISING OR BLEEDING  TENDERNESS IN MOUTH AND THROAT WITH OR WITHOUT PRESENCE OF ULCERS  *URINARY PROBLEMS  *BOWEL PROBLEMS  UNUSUAL RASH Items with * indicate a potential emergency and should be followed up as soon as possible.  Feel free to call the clinic should you have any questions or concerns. The clinic phone number is [phone].  Please show the [facility] at check-in to the Emergency Department and triage nurse.

## 2020-01-29 MED ORDER — CARVEDILOL 12.5 MG PO TABS: 12.5000 mg | ORAL_TABLET | Freq: Three times a day (TID) | ORAL | 3 refills | Status: DC

## 2020-02-04 ENCOUNTER — Other Ambulatory Visit: Payer: Self-pay | Admitting: Internal Medicine

## 2020-02-04 DIAGNOSIS — C3491 Malignant neoplasm of unspecified part of right bronchus or lung: Secondary | ICD-10-CM

## 2020-02-06 NOTE — Progress Notes (Signed)
[facility] OFFICE PROGRESS NOTE  [name], DO [place] [address]  DIAGNOSIS: Stage IV (T3, N0, M1c) non-small cell lung cancer, squamous cell carcinoma presented with large right infrahilar mass in addition to left upper lobe lung nodule as well as left axillary mass with left axillary lymph node diagnosed in August 2019.  PRIOR THERAPY: 1) Palliative radiotherapy to the right infrahilar mass as well as the axillary mass under the care of Dr. Lisbeth Renshaw. 2) Systemic chemotherapy with carboplatin for AUC of 5, paclitaxel 175 mg/M2 and Keytruda 200 mg IV every 3 weeks status post 4 cycles  CURRENT THERAPY: Maintenance immunotherapy with single agent Keytruda 200 mg IV every 3 weeks status post27cycles.  INTERVAL HISTORY: Travis Padilla 74 y.o. male returns to the clinic for a follow up visit. The patient is feeling fairly well today without any concerning complaints except for fatigue. He is staying active with spin classes, yoga, and going to the gym. 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 He completed physical therapy for this. The patient continues to tolerate treatment with [facility] well without any adverse effects except for mild generalized itching without rash. He has a prescription for atarax.  Denies any fever, night sweats, or weight loss. Denies any chest pain, shortness of breath, or hemoptysis. He endorses a chronic dry cough for which he uses cough drops. Denies any nausea or vomiting. He occasionally experiences both diarrhea and constipation but nothing out of the ordinary for him. He notes that he has had a mild headache in the right temportary region the last few days. He has been taking tylenol. Denies associated changes with his balance, seizures, or new visual symptoms. He has baseline visual issues for which he has implants. Denies any rashes or skin changes. The patient is here today for evaluation prior to starting cycle #  32  MEDICAL HISTORY: Past Medical History:  Diagnosis Date  . Abnormal nuclear stress test    December, 2013  . Anemia   . CAD (coronary artery disease)    Mild nonobstructive plaque in cath 2013  . Chest pain    December, 2013  . Dizziness   . Gout   . Hemorrhoid   . Hyperlipidemia   . Hypertension   . 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 Skin cancer   . Spinal stenosis     ALLERGIES:  has no active allergies.  MEDICATIONS:  Current Outpatient Medications  Medication Sig Dispense Refill  . Apple Cider Vinegar 500 MG TABS Take by mouth.    . Artificial Tear Solution (SOOTHE XP OP) Place 1 drop into both eyes 2 (two) times daily.    . benzonatate (TESSALON) 200 MG capsule Take 1 capsule (200 mg total) by mouth 2 (two) times daily as needed for cough. 20 capsule 0  . carvedilol (COREG) 12.5 MG tablet Take 1 tablet (12.5 mg total) by mouth in the morning, at noon, and at bedtime. 270 tablet 3  . cefdinir (OMNICEF) 300 MG capsule Take 1 capsule (300 mg total) by mouth 2 (two) times daily. 1 po BID 20 capsule 0  . Cyanocobalamin (VITAMIN B 12) 250 MCG LOZG Take by mouth.    . diphenhydramine-acetaminophen (TYLENOL PM) 25-500 MG TABS tablet Take 1-2 tablets by mouth at bedtime as needed (pain).    . ferrous sulfate 324  MG TBEC Take 324 mg by mouth daily with breakfast.    . levothyroxine (SYNTHROID) 150 MCG tablet TAKE 1 TABLET BY MOUTH EVERY DAY BEFORE BREAKFAST 30 tablet 0  . lidocaine-prilocaine (EMLA) cream Apply 1 application topically as needed. 30 g 1  . methocarbamol (ROBAXIN) 500 MG tablet Take 500 mg by mouth daily.    . Multiple Vitamins-Minerals (PRESERVISION AREDS 2 PO) Take by mouth.    . senna-docusate (SENNA S) 8.6-50 MG tablet 1 to 2 twice daily for constipation 120 tablet 5  . traZODone (DESYREL) 50 MG tablet Take 1-2 tablets (50-100 mg total) by mouth at bedtime as  needed for sleep. 180 tablet 0  . hydrOXYzine (ATARAX/VISTARIL) 10 MG tablet Take 1 tablet (10 mg total) by mouth 3 (three) times daily as needed for itching. (Patient not taking: Reported on 12/28/2019) 30 tablet 2  . oxyCODONE-acetaminophen (PERCOCET/ROXICET) 5-325 MG tablet Take 1 tablet by mouth every 8 (eight) hours as needed for severe pain. (Patient not taking: Reported on 12/28/2019) 30 tablet 0  . pravastatin (PRAVACHOL) 40 MG tablet Take 1 tablet (40 mg total) by mouth every evening. 90 tablet 3   No current facility-administered medications for this visit.   Facility-Administered Medications Ordered in Other Visits  Medication Dose Route Frequency Provider Last Rate Last Admin  . heparin lock flush 100 unit/mL  500 Units Intracatheter Once PRN Curt Bears, MD      . pembrolizumab [facility]) 200 mg in sodium chloride 0.9 % 50 mL chemo infusion  200 mg Intravenous Once Curt Bears, MD      . sodium chloride flush (NS) 0.9 % injection 10 mL  10 mL Intracatheter PRN Curt Bears, MD        SURGICAL HISTORY:  Past Surgical History:  Procedure Laterality Date  . 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 BELPHAROPTOSIS REPAIR Bilateral   . CATARACT EXTRACTION W/ INTRAOCULAR LENS  IMPLANT, BILATERAL    . COLONOSCOPY N/A 11/03/2014   Procedure: COLONOSCOPY;  Surgeon: Rogene Houston, MD;  [facility];  Service: Endoscopy;  Laterality: N/A;  1225  . IR IMAGING GUIDED PORT INSERTION  07/11/2018  . KNEE CARTILAGE SURGERY Left    Left knee  . SKIN CANCER EXCISION  12/2014, 04/25/15  . VIDEO BRONCHOSCOPY Bilateral 05/09/2018   Procedure: VIDEO BRONCHOSCOPY WITH FLUORO;  Surgeon: Juanito Doom, MD;  Location: [facility];  Service: Cardiopulmonary;  Laterality: Bilateral;    REVIEW OF SYSTEMS:   Review of Systems  Constitutional: Positive for fatigue. Negative for appetite change, fever and unexpected weight change.  HENT: Positive for taste changes. Negative for  mouth sores, nosebleeds, thrush, sore throat and trouble swallowing.   Eyes: Negative for eye problems and icterus.  Respiratory: Positive for chronic dry cough. Negative for hemoptysis, shortness of breath and wheezing.   Cardiovascular: Negative for chest pain and leg swelling.  Gastrointestinal: Negative for abdominal pain, constipation, diarrhea, nausea and vomiting.  Genitourinary: Negative for bladder incontinence, difficulty urinating, dysuria, frequency and hematuria.   Musculoskeletal: Positive for chronic back pain secondary to hx of herniated disk. Negative for gait problem, neck pain and neck stiffness.  Skin: Positive for mild itching. Negative for rash.  Neurological: Negative for dizziness, extremity weakness, gait problem, headaches, light-headedness and seizures.  Hematological: Negative for adenopathy. Does not bruise/bleed easily.  Psychiatric/Behavioral: Negative for confusion, depression and sleep disturbance. The patient is not nervous/anxious.     PHYSICAL EXAMINATION:  Blood pressure 108/76, pulse  72, temperature (!) 97.5 F (36.4 C), temperature source Temporal, resp. rate 18, height 6\' 2"  (1.88 m), weight 238 lb 9.6 oz (108.2 kg), SpO2 99 %.  ECOG PERFORMANCE STATUS: 1 - Symptomatic but completely ambulatory  Physical Exam  Constitutional: Oriented to person, place, and time and well-developed, well-nourished, and in no distress.  HENT:  Head: Normocephalic and atraumatic.  Mouth/Throat: Oropharynx is clear and moist. No oropharyngeal exudate.  Eyes: Conjunctivae are normal. Right eye exhibits no discharge. Left eye exhibits no discharge. No scleral icterus.  Neck: Normal range of motion. Neck supple.  Cardiovascular: Normal rate, regular rhythm, normal heart sounds and intact distal pulses.   Pulmonary/Chest: Effort normal and breath sounds normal. No respiratory distress. No wheezes. No rales.  Abdominal: Soft. Bowel sounds are normal. Exhibits no distension  and no mass. There is no tenderness.  Musculoskeletal: Normal range of motion. Exhibits no edema.  Lymphadenopathy:    No cervical adenopathy.  Neurological: Alert and oriented to person, place, and time. Exhibits normal muscle tone. Gait normal. Coordination normal.  Skin: Skin is warm and dry. No rash noted. Not diaphoretic. No erythema. No pallor.  Psychiatric: Mood, memory and judgment normal.  Vitals reviewed.  LABORATORY DATA: Lab Results  Component Value Date   WBC 3.5 (L) 02/09/2020   HGB 12.2 (L) 02/09/2020   HCT 35.7 (L) 02/09/2020   MCV 91.1 02/09/2020   PLT 124 (L) 02/09/2020      Chemistry      Component Value Date/Time   NA 139 02/09/2020 1015   NA 144 04/09/2018 1507   K 4.3 02/09/2020 1015   CL 108 02/09/2020 1015   CO2 22 02/09/2020 1015   BUN 14 02/09/2020 1015   BUN 15 04/09/2018 1507   CREATININE 0.99 02/09/2020 1015      Component Value Date/Time   CALCIUM 9.4 02/09/2020 1015   ALKPHOS 54 02/09/2020 1015   AST 26 02/09/2020 1015   ALT 24 02/09/2020 1015   BILITOT 0.6 02/09/2020 1015       RADIOGRAPHIC STUDIES:  No results found.   ASSESSMENT/PLAN:  This is a very pleasant 75 year old Caucasian male with a very light smoking history. He was diagnosed with stage IV (T3, N0, M1C) non-small cell lung cancer, squamous cell carcinoma based on the biopsy of the left axillary mass. He was diagnosed in August 2019.   He is status post 4 cycles of induction systemic chemotherapy with carboplatin, paclitaxel, and Keytruda with a partial response. The patient is currently undergoing maintenance immunotherapy with Keytruda 200 mg IV every 3 weeks. He is status post27cyclesof single agent Keytruda. He developed a skin rash after cycle #7 which resolved with a Medrol Dosepak and Benadryl.Otherwise, he is tolerating treatment well without any adverse effects.  Labs were reviewed. Recommend that he proceed with cycle #28 today as scheduled.   I  will arrange for a restaging CT scan of the chest, abdomen, and pelvis prior to his next cycle of treatment.   We will see him back for follow-up visit in 3 weeks for evaluation and to review his scan results before starting cycle #29 with single agent Keytruda.  We will continue to monitor his TSH closely due to his hypothyroidism and make dose adjustments if necessary.   Regarding his recent mild headache, the patient was advised to use tylenol. If he develops new, persistent, or worsening symptoms he was advised to contact us and let us know. Advised the patient to call if he experiences  nausea/vomiting, new visual changes, gait changes, or worsening headache.   The patient is physically active. I encouraged the patient to continue to be active as he has been doing.   The patient was advised to call immediately if he has any concerning symptoms in the interval. The patient voices understanding of current disease status and treatment options and is in agreement with the current care plan. All questions were answered. The patient knows to call the clinic with any problems, questions or concerns. We can certainly see the patient much sooner if necessary      Orders Placed This Encounter  Procedures  . CT Chest W Contrast    Standing Status:   Future    Standing Expiration Date:   02/08/2021    Order Specific Question:   ** REASON FOR EXAM (FREE TEXT)    Answer:   Restaging Lung Cancer    Order Specific Question:   If indicated for the ordered procedure, I authorize the administration of contrast media per Radiology protocol    Answer:   Yes    Order Specific Question:   Preferred imaging location?    Answer:   [facility]    Order Specific Question:   Radiology Contrast Protocol - do NOT remove file path    Answer:   \\[email or web address]  . CT Abdomen Pelvis W Contrast    Standing Status:   Future    Standing Expiration Date:   02/08/2021    Order  Specific Question:   ** REASON FOR EXAM (FREE TEXT)    Answer:   Restaging Lung Cancer    Order Specific Question:   If indicated for the ordered procedure, I authorize the administration of contrast media per Radiology protocol    Answer:   Yes    Order Specific Question:   Preferred imaging location?    Answer:   [facility]    Order Specific Question:   Is Oral Contrast requested for this exam?    Answer:   Yes, Per Radiology protocol    Order Specific Question:   Radiology Contrast Protocol - do NOT remove file path    Answer:   \\[email or web address]     [place], PA-C 02/09/20

## 2020-02-09 ENCOUNTER — Inpatient Hospital Stay: Payer: Medicare Other

## 2020-02-09 ENCOUNTER — Inpatient Hospital Stay (HOSPITAL_BASED_OUTPATIENT_CLINIC_OR_DEPARTMENT_OTHER): Payer: Medicare Other | Admitting: Physician Assistant

## 2020-02-09 ENCOUNTER — Other Ambulatory Visit: Payer: Self-pay

## 2020-02-09 ENCOUNTER — Inpatient Hospital Stay: Payer: Medicare Other | Attending: Oncology

## 2020-02-09 VITALS — BP 108/76 | HR 72 | Temp 97.5°F | Resp 18 | Ht 74.0 in | Wt 238.6 lb

## 2020-02-09 DIAGNOSIS — R5383 Other fatigue: Secondary | ICD-10-CM | POA: Insufficient documentation

## 2020-02-09 DIAGNOSIS — Z79899 Other long term (current) drug therapy: Secondary | ICD-10-CM | POA: Insufficient documentation

## 2020-02-09 DIAGNOSIS — K5900 Constipation, unspecified: Secondary | ICD-10-CM | POA: Insufficient documentation

## 2020-02-09 DIAGNOSIS — Z5112 Encounter for antineoplastic immunotherapy: Secondary | ICD-10-CM | POA: Diagnosis not present

## 2020-02-09 DIAGNOSIS — C773 Secondary and unspecified malignant neoplasm of axilla and upper limb lymph nodes: Secondary | ICD-10-CM | POA: Insufficient documentation

## 2020-02-09 DIAGNOSIS — Z95828 Presence of other vascular implants and grafts: Secondary | ICD-10-CM

## 2020-02-09 DIAGNOSIS — G8929 Other chronic pain: Secondary | ICD-10-CM | POA: Diagnosis not present

## 2020-02-09 DIAGNOSIS — C3491 Malignant neoplasm of unspecified part of right bronchus or lung: Secondary | ICD-10-CM

## 2020-02-09 DIAGNOSIS — E039 Hypothyroidism, unspecified: Secondary | ICD-10-CM | POA: Insufficient documentation

## 2020-02-09 DIAGNOSIS — R05 Cough: Secondary | ICD-10-CM | POA: Diagnosis not present

## 2020-02-09 DIAGNOSIS — Z85828 Personal history of other malignant neoplasm of skin: Secondary | ICD-10-CM | POA: Insufficient documentation

## 2020-02-09 DIAGNOSIS — M549 Dorsalgia, unspecified: Secondary | ICD-10-CM | POA: Diagnosis not present

## 2020-02-09 DIAGNOSIS — I2510 Atherosclerotic heart disease of native coronary artery without angina pectoris: Secondary | ICD-10-CM | POA: Insufficient documentation

## 2020-02-09 DIAGNOSIS — E785 Hyperlipidemia, unspecified: Secondary | ICD-10-CM | POA: Insufficient documentation

## 2020-02-09 DIAGNOSIS — C7801 Secondary malignant neoplasm of right lung: Secondary | ICD-10-CM

## 2020-02-09 DIAGNOSIS — K5730 Diverticulosis of large intestine without perforation or abscess without bleeding: Secondary | ICD-10-CM | POA: Insufficient documentation

## 2020-02-09 DIAGNOSIS — R197 Diarrhea, unspecified: Secondary | ICD-10-CM | POA: Diagnosis not present

## 2020-02-09 DIAGNOSIS — I10 Essential (primary) hypertension: Secondary | ICD-10-CM | POA: Diagnosis not present

## 2020-02-09 DIAGNOSIS — C3412 Malignant neoplasm of upper lobe, left bronchus or lung: Secondary | ICD-10-CM | POA: Diagnosis not present

## 2020-02-09 LAB — CBC WITH DIFFERENTIAL (CANCER CENTER ONLY)
Abs Immature Granulocytes: 0.01 10*3/uL (ref 0.00–0.07)
Basophils Absolute: 0 10*3/uL (ref 0.0–0.1)
Basophils Relative: 1 %
Eosinophils Absolute: 0.2 10*3/uL (ref 0.0–0.5)
Eosinophils Relative: 5 %
HCT: 35.7 % — ABNORMAL LOW (ref 39.0–52.0)
Hemoglobin: 12.2 g/dL — ABNORMAL LOW (ref 13.0–17.0)
Immature Granulocytes: 0 %
Lymphocytes Relative: 16 %
Lymphs Abs: 0.6 10*3/uL — ABNORMAL LOW (ref 0.7–4.0)
MCH: 31.1 pg (ref 26.0–34.0)
MCHC: 34.2 g/dL (ref 30.0–36.0)
MCV: 91.1 fL (ref 80.0–100.0)
Monocytes Absolute: 0.7 10*3/uL (ref 0.1–1.0)
Monocytes Relative: 21 %
Neutro Abs: 2 10*3/uL (ref 1.7–7.7)
Neutrophils Relative %: 57 %
Platelet Count: 124 10*3/uL — ABNORMAL LOW (ref 150–400)
RBC: 3.92 MIL/uL — ABNORMAL LOW (ref 4.22–5.81)
RDW: 12.2 % (ref 11.5–15.5)
WBC Count: 3.5 10*3/uL — ABNORMAL LOW (ref 4.0–10.5)
nRBC: 0 % (ref 0.0–0.2)

## 2020-02-09 LAB — CMP (CANCER CENTER ONLY)
ALT: 24 U/L (ref 0–44)
AST: 26 U/L (ref 15–41)
Albumin: 3.9 g/dL (ref 3.5–5.0)
Alkaline Phosphatase: 54 U/L (ref 38–126)
Anion gap: 9 (ref 5–15)
BUN: 14 mg/dL (ref 8–23)
CO2: 22 mmol/L (ref 22–32)
Calcium: 9.4 mg/dL (ref 8.9–10.3)
Chloride: 108 mmol/L (ref 98–111)
Creatinine: 0.99 mg/dL (ref 0.61–1.24)
GFR, Est AFR Am: >60 mL/min (ref >60)
GFR, Estimated: >60 mL/min (ref >60)
Glucose, Bld: 102 mg/dL — ABNORMAL HIGH (ref 70–99)
Potassium: 4.3 mmol/L (ref 3.5–5.1)
Sodium: 139 mmol/L (ref 135–145)
Total Bilirubin: 0.6 mg/dL (ref 0.3–1.2)
Total Protein: 7.1 g/dL (ref 6.5–8.1)

## 2020-02-09 LAB — TSH: TSH: 2.292 u[IU]/mL (ref 0.350–4.500)

## 2020-02-09 MED ORDER — SODIUM CHLORIDE 0.9 % IV SOLN
200.0000 mg | Freq: Once | INTRAVENOUS | Status: AC
  Administered 2020-02-09: 200 mg via INTRAVENOUS
  Filled 2020-02-09: qty 8

## 2020-02-09 MED ORDER — SODIUM CHLORIDE 0.9 % IV SOLN
Freq: Once | INTRAVENOUS | Status: AC
  Filled 2020-02-09: qty 250

## 2020-02-09 MED ORDER — SODIUM CHLORIDE 0.9% FLUSH
10.0000 mL | INTRAVENOUS | Status: DC | PRN
  Administered 2020-02-09: 10 mL
  Filled 2020-02-09: qty 10

## 2020-02-09 MED ORDER — HEPARIN SOD (PORK) LOCK FLUSH 100 UNIT/ML IV SOLN
500.0000 [IU] | Freq: Once | INTRAVENOUS | Status: AC | PRN
  Administered 2020-02-09: 500 [IU]
  Filled 2020-02-09: qty 5

## 2020-02-09 MED ORDER — SODIUM CHLORIDE 0.9% FLUSH
10.0000 mL | Freq: Once | INTRAVENOUS | Status: AC
  Administered 2020-02-09: 10 mL
  Filled 2020-02-09: qty 10

## 2020-02-09 NOTE — Patient Instructions (Signed)
[facility] Discharge Instructions for Patients Receiving Chemotherapy/Immunotherapy  Today you received the following chemotherapy/immunotherapy agents :  Pembrolizumab.  To help prevent nausea and vomiting after your treatment, we encourage you to take your nausea medication as prescribed.   If you develop nausea and vomiting that is not controlled by your nausea medication, call the clinic.   BELOW ARE SYMPTOMS THAT SHOULD BE REPORTED IMMEDIATELY:  *FEVER GREATER THAN 100.5 F  *CHILLS WITH OR WITHOUT FEVER  NAUSEA AND VOMITING THAT IS NOT CONTROLLED WITH YOUR NAUSEA MEDICATION  *UNUSUAL SHORTNESS OF BREATH  *UNUSUAL BRUISING OR BLEEDING  TENDERNESS IN MOUTH AND THROAT WITH OR WITHOUT PRESENCE OF ULCERS  *URINARY PROBLEMS  *BOWEL PROBLEMS  UNUSUAL RASH Items with * indicate a potential emergency and should be followed up as soon as possible.  Feel free to call the clinic should you have any questions or concerns. The clinic phone number is [phone].  Please show the [facility] at check-in to the Emergency Department and triage nurse.

## 2020-02-10 ENCOUNTER — Ambulatory Visit: Payer: Medicare Other

## 2020-02-10 ENCOUNTER — Other Ambulatory Visit: Payer: Medicare Other

## 2020-02-10 ENCOUNTER — Ambulatory Visit: Payer: Medicare Other | Admitting: Internal Medicine

## 2020-02-29 ENCOUNTER — Ambulatory Visit (HOSPITAL_COMMUNITY)
Admission: RE | Admit: 2020-02-29 | Discharge: 2020-02-29 | Disposition: A | Discharge status: Home / Self Care | Payer: Medicare Other | Source: Ambulatory Visit | Attending: Physician Assistant | Admitting: Physician Assistant

## 2020-02-29 DIAGNOSIS — C3491 Malignant neoplasm of unspecified part of right bronchus or lung: Secondary | ICD-10-CM | POA: Insufficient documentation

## 2020-02-29 MED ORDER — HEPARIN SOD (PORK) LOCK FLUSH 100 UNIT/ML IV SOLN
INTRAVENOUS | Status: AC
  Administered 2020-02-29: 500 [IU]
  Filled 2020-02-29: qty 5

## 2020-02-29 MED ORDER — HEPARIN SOD (PORK) LOCK FLUSH 100 UNIT/ML IV SOLN: 500.0000 [IU] | Freq: Once | INTRAVENOUS | Status: DC

## 2020-02-29 MED ORDER — IOHEXOL 300 MG/ML  SOLN
100.0000 mL | Freq: Once | INTRAMUSCULAR | Status: AC
  Administered 2020-02-29: 100 mL via INTRAVENOUS

## 2020-02-29 MED ORDER — SODIUM CHLORIDE (PF) 0.9 % IJ SOLN
INTRAMUSCULAR | Status: AC
  Filled 2020-02-29: qty 50

## 2020-03-01 ENCOUNTER — Other Ambulatory Visit: Payer: Self-pay

## 2020-03-01 ENCOUNTER — Inpatient Hospital Stay: Payer: Medicare Other

## 2020-03-01 ENCOUNTER — Inpatient Hospital Stay (HOSPITAL_BASED_OUTPATIENT_CLINIC_OR_DEPARTMENT_OTHER): Payer: Medicare Other | Admitting: Internal Medicine

## 2020-03-01 ENCOUNTER — Other Ambulatory Visit: Payer: Self-pay | Admitting: Internal Medicine

## 2020-03-01 ENCOUNTER — Encounter: Payer: Self-pay | Admitting: Internal Medicine

## 2020-03-01 VITALS — BP 109/66 | HR 75 | Temp 97.5°F | Resp 18 | Ht 74.0 in | Wt 238.2 lb

## 2020-03-01 DIAGNOSIS — Z95828 Presence of other vascular implants and grafts: Secondary | ICD-10-CM

## 2020-03-01 DIAGNOSIS — Z5112 Encounter for antineoplastic immunotherapy: Secondary | ICD-10-CM

## 2020-03-01 DIAGNOSIS — I10 Essential (primary) hypertension: Secondary | ICD-10-CM

## 2020-03-01 DIAGNOSIS — C3491 Malignant neoplasm of unspecified part of right bronchus or lung: Secondary | ICD-10-CM

## 2020-03-01 DIAGNOSIS — C7801 Secondary malignant neoplasm of right lung: Secondary | ICD-10-CM

## 2020-03-01 DIAGNOSIS — E039 Hypothyroidism, unspecified: Secondary | ICD-10-CM

## 2020-03-01 LAB — CBC WITH DIFFERENTIAL (CANCER CENTER ONLY)
Abs Immature Granulocytes: 0.01 10*3/uL (ref 0.00–0.07)
Basophils Absolute: 0 10*3/uL (ref 0.0–0.1)
Basophils Relative: 1 %
Eosinophils Absolute: 0.2 10*3/uL (ref 0.0–0.5)
Eosinophils Relative: 5 %
HCT: 36.6 % — ABNORMAL LOW (ref 39.0–52.0)
Hemoglobin: 12.4 g/dL — ABNORMAL LOW (ref 13.0–17.0)
Immature Granulocytes: 0 %
Lymphocytes Relative: 18 %
Lymphs Abs: 0.9 10*3/uL (ref 0.7–4.0)
MCH: 31.4 pg (ref 26.0–34.0)
MCHC: 33.9 g/dL (ref 30.0–36.0)
MCV: 92.7 fL (ref 80.0–100.0)
Monocytes Absolute: 0.7 10*3/uL (ref 0.1–1.0)
Monocytes Relative: 13 %
Neutro Abs: 3.1 10*3/uL (ref 1.7–7.7)
Neutrophils Relative %: 63 %
Platelet Count: 147 10*3/uL — ABNORMAL LOW (ref 150–400)
RBC: 3.95 MIL/uL — ABNORMAL LOW (ref 4.22–5.81)
RDW: 12.1 % (ref 11.5–15.5)
WBC Count: 4.9 10*3/uL (ref 4.0–10.5)
nRBC: 0 % (ref 0.0–0.2)

## 2020-03-01 LAB — CMP (CANCER CENTER ONLY)
ALT: 16 U/L (ref 0–44)
AST: 16 U/L (ref 15–41)
Albumin: 3.8 g/dL (ref 3.5–5.0)
Alkaline Phosphatase: 60 U/L (ref 38–126)
Anion gap: 7 (ref 5–15)
BUN: 13 mg/dL (ref 8–23)
CO2: 25 mmol/L (ref 22–32)
Calcium: 8.9 mg/dL (ref 8.9–10.3)
Chloride: 108 mmol/L (ref 98–111)
Creatinine: 0.99 mg/dL (ref 0.61–1.24)
GFR, Est AFR Am: >60 mL/min (ref >60)
GFR, Estimated: >60 mL/min (ref >60)
Glucose, Bld: 97 mg/dL (ref 70–99)
Potassium: 4.2 mmol/L (ref 3.5–5.1)
Sodium: 140 mmol/L (ref 135–145)
Total Bilirubin: 0.5 mg/dL (ref 0.3–1.2)
Total Protein: 7.2 g/dL (ref 6.5–8.1)

## 2020-03-01 LAB — TSH: TSH: 1.599 u[IU]/mL (ref 0.320–4.118)

## 2020-03-01 MED ORDER — SODIUM CHLORIDE 0.9% FLUSH
10.0000 mL | INTRAVENOUS | Status: DC | PRN
  Administered 2020-03-01: 10 mL
  Filled 2020-03-01: qty 10

## 2020-03-01 MED ORDER — SODIUM CHLORIDE 0.9 % IV SOLN
200.0000 mg | Freq: Once | INTRAVENOUS | Status: AC
  Administered 2020-03-01: 200 mg via INTRAVENOUS
  Filled 2020-03-01: qty 8

## 2020-03-01 MED ORDER — SODIUM CHLORIDE 0.9% FLUSH
10.0000 mL | Freq: Once | INTRAVENOUS | Status: AC
  Administered 2020-03-01: 10 mL
  Filled 2020-03-01: qty 10

## 2020-03-01 MED ORDER — HEPARIN SOD (PORK) LOCK FLUSH 100 UNIT/ML IV SOLN
500.0000 [IU] | Freq: Once | INTRAVENOUS | Status: AC | PRN
  Administered 2020-03-01: 500 [IU]
  Filled 2020-03-01: qty 5

## 2020-03-01 MED ORDER — SODIUM CHLORIDE 0.9 % IV SOLN
Freq: Once | INTRAVENOUS | Status: AC
  Filled 2020-03-01: qty 250

## 2020-03-01 NOTE — Patient Instructions (Signed)
[facility] Discharge Instructions for Patients Receiving Chemotherapy  Today you received the following chemotherapy agents: pembrolizumab.  To help prevent nausea and vomiting after your treatment, we encourage you to take your nausea medication as directed.   If you develop nausea and vomiting that is not controlled by your nausea medication, call the clinic.   BELOW ARE SYMPTOMS THAT SHOULD BE REPORTED IMMEDIATELY:  *FEVER GREATER THAN 100.5 F  *CHILLS WITH OR WITHOUT FEVER  NAUSEA AND VOMITING THAT IS NOT CONTROLLED WITH YOUR NAUSEA MEDICATION  *UNUSUAL SHORTNESS OF BREATH  *UNUSUAL BRUISING OR BLEEDING  TENDERNESS IN MOUTH AND THROAT WITH OR WITHOUT PRESENCE OF ULCERS  *URINARY PROBLEMS  *BOWEL PROBLEMS  UNUSUAL RASH Items with * indicate a potential emergency and should be followed up as soon as possible.  Feel free to call the clinic should you have any questions or concerns. The clinic phone number is [phone].  Please show the CHEMO ALERT CARD at check-in to the Emergency Department and triage nurse.   

## 2020-03-01 NOTE — Progress Notes (Signed)
[place] [phone]   [phone]  OFFICE PROGRESS NOTE  [name], DO [employer] [place] [address]  DIAGNOSIS: Stage IV (T3, N0, M1c) non-small cell lung cancer, squamous cell carcinoma presented with large right infrahilar mass in addition to left upper lobe lung nodule as well as left axillary mass with left axillary lymph node diagnosed in August 2019.  PRIOR THERAPY:  1) Palliative radiotherapy to the right infrahilar mass as well as the axillary mass under the care of Dr. Lisbeth Renshaw. 2) Systemic chemotherapy with carboplatin for AUC of 5, paclitaxel 175 mg/M2 and Keytruda 200 mg IV every 3 weeks status post 4 cycles.  CURRENT THERAPY: Maintenance immunotherapy with single agent Keytruda 200 mg IV every 3 weeks status post 28 cycles.  INTERVAL HISTORY: Travis Padilla 74 y.o. male returns to the clinic today for follow-up visit.  The patient is feeling fine today with no concerning complaints except for increasing cough recently.  He denied having any current chest pain, shortness of breath or hemoptysis.  He denied having any nausea, vomiting, diarrhea or constipation.  He has no headache or visual changes.  He has no recent weight loss or night sweats.  The patient continues to tolerate his treatment with [facility] fairly well.  He had repeat CT scan of the chest, abdomen pelvis performed recently and is here for evaluation and discussion of his discuss results.  MEDICAL HISTORY: Past Medical History:  Diagnosis Date  . Abnormal nuclear stress test    December, 2013  . Anemia   . CAD (coronary artery disease)    Mild nonobstructive plaque in cath 2013  . Chest pain    December, 2013  . Dizziness   . Gout   . Hemorrhoid   . Hyperlipidemia   . Hypertension   . 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 Skin cancer   . Spinal stenosis       ALLERGIES:  has no active allergies.  MEDICATIONS:  Current Outpatient Medications  Medication Sig Dispense Refill  . Apple Cider Vinegar 500 MG TABS Take by mouth.    . Artificial Tear Solution (SOOTHE XP OP) Place 1 drop into both eyes 2 (two) times daily.    . benzonatate (TESSALON) 200 MG capsule Take 1 capsule (200 mg total) by mouth 2 (two) times daily as needed for cough. 20 capsule 0  . carvedilol (COREG) 12.5 MG tablet Take 1 tablet (12.5 mg total) by mouth in the morning, at noon, and at bedtime. 270 tablet 3  . cefdinir (OMNICEF) 300 MG capsule Take 1 capsule (300 mg total) by mouth 2 (two) times daily. 1 po BID 20 capsule 0  . Cyanocobalamin (VITAMIN B 12) 250 MCG LOZG Take by mouth.    . diphenhydramine-acetaminophen (TYLENOL PM) 25-500 MG TABS tablet Take 1-2 tablets by mouth at bedtime as needed (pain).    . ferrous sulfate 324 MG TBEC Take 324 mg by mouth daily with breakfast.    . hydrOXYzine (ATARAX/VISTARIL) 10 MG tablet Take 1 tablet (10 mg total) by mouth 3 (three) times daily as needed for itching. (Patient not taking: Reported on 12/28/2019) 30 tablet 2  . levothyroxine (SYNTHROID) 150 MCG tablet TAKE 1 TABLET BY MOUTH EVERY DAY BEFORE BREAKFAST 30 tablet 0  . lidocaine-prilocaine (EMLA) cream Apply 1 application topically as needed. [place]  g 1  . methocarbamol (ROBAXIN) 500 MG tablet Take 500 mg by mouth daily.    . Multiple Vitamins-Minerals (PRESERVISION AREDS 2 PO) Take by mouth.    . oxyCODONE-acetaminophen (PERCOCET/ROXICET) 5-325 MG tablet Take 1 tablet by mouth every 8 (eight) hours as needed for severe pain. (Patient not taking: Reported on 12/28/2019) 30 tablet 0  . pravastatin (PRAVACHOL) 40 MG tablet Take 1 tablet (40 mg total) by mouth every evening. 90 tablet 3  . senna-docusate (SENNA S) 8.6-50 MG tablet 1 to 2 twice daily for constipation 120 tablet 5  . traZODone (DESYREL) 50 MG tablet Take 1-2 tablets (50-100 mg total) by mouth at bedtime as needed for  sleep. 180 tablet 0   No current facility-administered medications for this visit.    SURGICAL HISTORY:  Past Surgical History:  Procedure Laterality Date  . 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 BELPHAROPTOSIS REPAIR Bilateral   . CATARACT EXTRACTION W/ INTRAOCULAR LENS  IMPLANT, BILATERAL    . COLONOSCOPY N/A 11/03/2014   Procedure: COLONOSCOPY;  Surgeon: Rogene Houston, MD;  [facility];  Service: Endoscopy;  Laterality: N/A;  1225  . IR IMAGING GUIDED PORT INSERTION  07/11/2018  . KNEE CARTILAGE SURGERY Left    Left knee  . SKIN CANCER EXCISION  12/2014, 04/25/15  . VIDEO BRONCHOSCOPY Bilateral 05/09/2018   Procedure: VIDEO BRONCHOSCOPY WITH FLUORO;  Surgeon: Juanito Doom, MD;  Location: [facility];  Service: Cardiopulmonary;  Laterality: Bilateral;    REVIEW OF SYSTEMS:  Constitutional: negative Eyes: negative Ears, nose, mouth, throat, and face: negative Respiratory: positive for cough Cardiovascular: negative Gastrointestinal: negative Genitourinary:negative Integument/breast: negative Hematologic/lymphatic: negative Musculoskeletal:negative Neurological: negative Behavioral/Psych: negative Endocrine: negative Allergic/Immunologic: negative   PHYSICAL EXAMINATION: General appearance: alert, cooperative and no distress Head: Normocephalic, without obvious abnormality, atraumatic Neck: no adenopathy, no JVD, supple, symmetrical, trachea midline and thyroid not enlarged, symmetric, no tenderness/mass/nodules Lymph nodes: Cervical, supraclavicular, and axillary nodes normal. Resp: clear to auscultation bilaterally Back: symmetric, no curvature. ROM normal. No CVA tenderness. Cardio: regular rate and rhythm, S1, S2 normal, no murmur, click, rub or gallop GI: soft, non-tender; bowel sounds normal; no masses,  no organomegaly Extremities: extremities normal, atraumatic, no cyanosis or edema Neurologic: Alert and oriented X 3, normal strength and tone.  Normal symmetric reflexes. Normal coordination and gait  ECOG PERFORMANCE STATUS: 1 - Symptomatic but completely ambulatory  Blood pressure 109/66, pulse 75, temperature (!) 97.5 F (36.4 C), temperature source Temporal, resp. rate 18, height 6\' 2"  (1.88 m), weight 238 lb 3.2 oz (108 kg), SpO2 97 %.  LABORATORY DATA: Lab Results  Component Value Date   WBC 4.9 03/01/2020   HGB 12.4 (L) 03/01/2020   HCT 36.6 (L) 03/01/2020   MCV 92.7 03/01/2020   PLT 147 (L) 03/01/2020      Chemistry      Component Value Date/Time   NA 140 03/01/2020 1002   NA 144 04/09/2018 1507   K 4.2 03/01/2020 1002   CL 108 03/01/2020 1002   CO2 25 03/01/2020 1002   BUN 13 03/01/2020 1002   BUN 15 04/09/2018 1507   CREATININE 0.99 03/01/2020 1002      Component Value Date/Time   CALCIUM 8.9 03/01/2020 1002   ALKPHOS 60 03/01/2020 1002   AST 16 03/01/2020 1002   ALT 16 03/01/2020 1002   BILITOT 0.5 03/01/2020 1002       RADIOGRAPHIC STUDIES: CT Chest W Contrast  Result Date: 02/29/2020  CLINICAL DATA:  Restaging right lung cancer, low back pain, ongoing immunotherapy, radiotherapy and chemotherapy complete EXAM: CT CHEST, ABDOMEN, AND PELVIS WITH CONTRAST TECHNIQUE: Multidetector CT imaging of the chest, abdomen and pelvis was performed following the standard protocol during bolus administration of intravenous contrast. CONTRAST:  170mL OMNIPAQUE IOHEXOL 300 MG/ML SOLN, additional oral enteric contrast COMPARISON:  11/16/2019 FINDINGS: CT CHEST FINDINGS Cardiovascular: Right chest port catheter. Aortic atherosclerosis. Normal heart size. Three-vessel coronary artery calcifications and stents. No pericardial effusion. Mediastinum/Nodes: No enlarged mediastinal, hilar, or axillary lymph nodes. Thyroid gland, trachea, and esophagus demonstrate no significant findings. Lungs/Pleura: No significant interval change in a flattened, lobulated mass of the lingula measuring 2.6 x 2.4 cm, previously 2.6 x 2.6 cm when  measured similarly (series 6, image 77). Unchanged post treatment appearance of the right hilum and perihilar right lung with fibrotic scarring and volume loss. No pleural effusion or pneumothorax. Musculoskeletal: Unchanged spiculated soft tissue nodule in the left axilla measuring 1.9 x 1.2 cm (series 2, image 8). No suspicious bone lesions identified. CT ABDOMEN PELVIS FINDINGS Hepatobiliary: No solid liver abnormality is seen. No gallstones, gallbladder wall thickening, or biliary dilatation. Pancreas: Unremarkable. No pancreatic ductal dilatation or surrounding inflammatory changes. Spleen: Normal in size without significant abnormality. Adrenals/Urinary Tract: Adrenal glands are unremarkable. Multiple small nonobstructive bilateral renal calculi. No hydronephrosis. Bladder is unremarkable. Stomach/Bowel: Stomach is within normal limits. Appendix appears normal. No evidence of bowel wall thickening, distention, or inflammatory changes. Sigmoid diverticulosis. Vascular/Lymphatic: Aortic atherosclerosis. No enlarged abdominal or pelvic lymph nodes. Reproductive: No mass or other abnormality. Other: No abdominal wall hernia or abnormality. No abdominopelvic ascites. Musculoskeletal: No acute or significant osseous findings. IMPRESSION: 1. Unchanged post treatment appearance of the right hilum and perihilar right lung with fibrotic scarring and volume loss, consistent with prior radiation therapy. 2. No significant interval change in a flattened, lobulated mass of the lingula measuring 2.6 x 2.4 cm, previously 2.6 x 2.6 cm when measured similarly. 3. Unchanged spiculated soft tissue nodule in the left axilla measuring 1.9 x 1.2 cm. 4. No evidence of metastatic disease within the abdomen or pelvis. 5. Nonobstructive bilateral nephrolithiasis. 6. Coronary artery disease.  Aortic Atherosclerosis (ICD10-I70.0). Electronically Signed   By: Eddie Candle M.D.   On: 02/29/2020 16:30   CT Abdomen Pelvis W  Contrast  Result Date: 02/29/2020 CLINICAL DATA:  Restaging right lung cancer, low back pain, ongoing immunotherapy, radiotherapy and chemotherapy complete EXAM: CT CHEST, ABDOMEN, AND PELVIS WITH CONTRAST TECHNIQUE: Multidetector CT imaging of the chest, abdomen and pelvis was performed following the standard protocol during bolus administration of intravenous contrast. CONTRAST:  190mL OMNIPAQUE IOHEXOL 300 MG/ML SOLN, additional oral enteric contrast COMPARISON:  11/16/2019 FINDINGS: CT CHEST FINDINGS Cardiovascular: Right chest port catheter. Aortic atherosclerosis. Normal heart size. Three-vessel coronary artery calcifications and stents. No pericardial effusion. Mediastinum/Nodes: No enlarged mediastinal, hilar, or axillary lymph nodes. Thyroid gland, trachea, and esophagus demonstrate no significant findings. Lungs/Pleura: No significant interval change in a flattened, lobulated mass of the lingula measuring 2.6 x 2.4 cm, previously 2.6 x 2.6 cm when measured similarly (series 6, image 77). Unchanged post treatment appearance of the right hilum and perihilar right lung with fibrotic scarring and volume loss. No pleural effusion or pneumothorax. Musculoskeletal: Unchanged spiculated soft tissue nodule in the left axilla measuring 1.9 x 1.2 cm (series 2, image 8). No suspicious bone lesions identified. CT ABDOMEN PELVIS FINDINGS Hepatobiliary: No solid liver abnormality is seen. No gallstones, gallbladder wall thickening, or biliary dilatation.  Pancreas: Unremarkable. No pancreatic ductal dilatation or surrounding inflammatory changes. Spleen: Normal in size without significant abnormality. Adrenals/Urinary Tract: Adrenal glands are unremarkable. Multiple small nonobstructive bilateral renal calculi. No hydronephrosis. Bladder is unremarkable. Stomach/Bowel: Stomach is within normal limits. Appendix appears normal. No evidence of bowel wall thickening, distention, or inflammatory changes. Sigmoid  diverticulosis. Vascular/Lymphatic: Aortic atherosclerosis. No enlarged abdominal or pelvic lymph nodes. Reproductive: No mass or other abnormality. Other: No abdominal wall hernia or abnormality. No abdominopelvic ascites. Musculoskeletal: No acute or significant osseous findings. IMPRESSION: 1. Unchanged post treatment appearance of the right hilum and perihilar right lung with fibrotic scarring and volume loss, consistent with prior radiation therapy. 2. No significant interval change in a flattened, lobulated mass of the lingula measuring 2.6 x 2.4 cm, previously 2.6 x 2.6 cm when measured similarly. 3. Unchanged spiculated soft tissue nodule in the left axilla measuring 1.9 x 1.2 cm. 4. No evidence of metastatic disease within the abdomen or pelvis. 5. Nonobstructive bilateral nephrolithiasis. 6. Coronary artery disease.  Aortic Atherosclerosis (ICD10-I70.0). Electronically Signed   By: Eddie Candle M.D.   On: 02/29/2020 16:30    ASSESSMENT AND PLAN: This is a very pleasant 73 years old white male with very light most smoking history recently diagnosed with stage IV (T3, N0, M1c) non-small cell lung cancer, squamous cell carcinoma based on the biopsy from the left axillary mass. He status post 4 cycles of induction systemic chemotherapy with carboplatin, paclitaxel and Keytruda with partial response.  The patient is currently on maintenance treatment with single agent Keytruda status post 28 cycles. The patient continues to tolerate this treatment well with no concerning adverse effects. He had repeat CT scan of the chest, abdomen pelvis performed recently.  I personally and independently reviewed the scans and discussed the results with the patient today. His scan showed no concerning findings for disease progression. I recommended for the patient to continue his current treatment with [facility] and he will proceed with cycle #29 of his maintenance therapy today. For the hypothyroidism, he will  continue his current treatment with levothyroxine and will monitor his TSH closely. The patient will come back for follow-up visit in 3 weeks for evaluation before the next cycle of his treatment. He was advised to call immediately if he has any concerning symptoms in the interval. The patient voices understanding of current disease status and treatment options and is in agreement with the current care plan. All questions were answered. The patient knows to call the clinic with any problems, questions or concerns. We can certainly see the patient much sooner if necessary.  Disclaimer: This note was dictated with voice recognition software. Similar sounding words can inadvertently be transcribed and may not be corrected upon review.

## 2020-03-06 NOTE — Progress Notes (Signed)
Cardiology Office Note   Date:  03/07/2020   ID:  Travis Padilla August 11, 1946, MRN [id number]  PCP:  [name], DO  Cardiologist:   No primary care provider on file.   Chief Complaint  Patient presents with  . Chest Pain      History of Present Illness: Travis Padilla is a 74 y.o. male who presents for follow up of palpitations.  He has been treated for small cell lung cancer.   He was treated with carrboplatin, paclitaxel  and Keytruda.  He has had radiation.   I reviewed the oncology notes and CT report for this appointment.  Recent CT demonstrated aortic atherosclerosis as well as coronary artery calcification.  He can talk about this.  He does get occasional sporadic chest discomfort.  He has occasional lightheadedness lasting 5 to 10 seconds.  These are not at the same time.  He is a not reproducible.  He does do some activities such as yoga and some exercise classes although he was not doing as much with the pandemic.  He is not having reproducible chest discomfort with this activity.  He was having some palpitations.  We tried backing off on beta-blockers because of some somatic complaints but he had more palpitations of the increase back his dose.  Of note he does not tolerate the pravastatin.  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  He stopped the medication and this went away.  He does not want to try statins again.  Past Medical History:  Diagnosis Date  . Abnormal nuclear stress test    December, 2013  . Anemia   . Axillary adenopathy 04/24/2018  . CAD (coronary artery disease)    Mild nonobstructive plaque in cath 2013  . Chest pain    December, 2013  . Cough with hemoptysis 04/22/2018  . Dizziness   . Dyslipidemia 07/14/2019  . Encounter for antineoplastic immunotherapy 05/13/2018  . Gout   . Hemorrhoid   . Hyperlipidemia   . Hypertension   . Hypothyroidism (acquired) 08/11/2018  . Metastasis to lung ([place]) 05/19/2018  . Metastatic lung cancer (metastasis  from lung to other site) ([place]) dx'd 04/17/18   to LN, infrahilar mass, lung nodule and lt axilla  . Obesity, unspecified 03/28/2009   Qualifier: Diagnosis of  By: [name], MD, [name]    . Palpitations 03/28/2009   Qualifier: Diagnosis of  By: [name], MD, [name]    . Right lower lobe lung mass 04/22/2018  . Skin cancer   . SNORING 03/28/2009   Qualifier: Diagnosis of  By: [name], MD, [name]    . Spinal stenosis   . 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[place]) 05/13/2018    Past Surgical History:  Procedure Laterality Date  . 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 BELPHAROPTOSIS REPAIR Bilateral   . CATARACT EXTRACTION W/ INTRAOCULAR LENS  IMPLANT, BILATERAL    . COLONOSCOPY N/A 11/03/2014   Procedure: COLONOSCOPY;  Surgeon: Rogene Houston, MD;  [facility];  Service: Endoscopy;  Laterality: N/A;  1225  . IR IMAGING GUIDED PORT INSERTION  07/11/2018  . KNEE CARTILAGE SURGERY Left    Left knee  . SKIN CANCER EXCISION  12/2014, 04/25/15  . VIDEO BRONCHOSCOPY Bilateral 05/09/2018   Procedure: VIDEO BRONCHOSCOPY WITH FLUORO;  Surgeon: Juanito Doom, MD;  [facility];  Service: Cardiopulmonary;  Laterality: Bilateral;     Current Outpatient Medications  Medication Sig  Dispense Refill  . Apple Cider Vinegar 500 MG TABS Take by mouth.    . Artificial Tear Solution (SOOTHE XP OP) Place 1 drop into both eyes 2 (two) times daily.    . benzonatate (TESSALON) 200 MG capsule Take 1 capsule (200 mg total) by mouth 2 (two) times daily as needed for cough. 20 capsule 0  . carvedilol (COREG) 12.5 MG tablet Take 1 tablet (12.5 mg total) by mouth in the morning, at noon, and at bedtime. 270 tablet 3  . cefdinir (OMNICEF) 300 MG capsule Take 1 capsule (300 mg total) by mouth 2 (two) times daily. 1 po BID 20 capsule 0  . Cyanocobalamin (VITAMIN B 12) 250 MCG LOZG Take by mouth.    . diphenhydramine-acetaminophen (TYLENOL PM) 25-500 MG TABS tablet Take 1-2  tablets by mouth at bedtime as needed (pain).    . ferrous sulfate 324 MG TBEC Take 324 mg by mouth daily with breakfast.    . hydrOXYzine (ATARAX/VISTARIL) 10 MG tablet Take 1 tablet (10 mg total) by mouth 3 (three) times daily as needed for itching. 30 tablet 2  . levothyroxine (SYNTHROID) 150 MCG tablet TAKE 1 TABLET BY MOUTH EVERY DAY BEFORE BREAKFAST 30 tablet 0  . lidocaine-prilocaine (EMLA) cream Apply 1 application topically as needed. 30 g 1  . methocarbamol (ROBAXIN) 500 MG tablet Take 500 mg by mouth daily.    . Multiple Vitamins-Minerals (PRESERVISION AREDS 2 PO) Take by mouth.    . oxyCODONE-acetaminophen (PERCOCET/ROXICET) 5-325 MG tablet Take 1 tablet by mouth every 8 (eight) hours as needed for severe pain. 30 tablet 0  . senna-docusate (SENNA S) 8.6-50 MG tablet 1 to 2 twice daily for constipation 120 tablet 5  . traZODone (DESYREL) 50 MG tablet Take 1-2 tablets (50-100 mg total) by mouth at bedtime as needed for sleep. 180 tablet 0  . pravastatin (PRAVACHOL) 40 MG tablet Take 1 tablet (40 mg total) by mouth every evening. 90 tablet 3   No current facility-administered medications for this visit.    Allergies:   Patient has no active allergies.    ROS:  Please see the history of present illness.   Otherwise, review of systems are positive for none.   All other systems are reviewed and negative.    PHYSICAL EXAM: VS:  BP 115/73   Pulse 82   Temp (!) 97.2 F (36.2 C)   Ht 6\' 2"  (1.88 m)   Wt 233 lb (105.7 kg)   SpO2 95%   BMI 29.92 kg/m  , BMI Body mass index is 29.92 kg/m. GENERAL:  Well appearing NECK:  No jugular venous distention, waveform within normal limits, carotid upstroke brisk and symmetric, no bruits, no thyromegaly LUNGS:  Clear to auscultation bilaterally CHEST:  Unremarkable HEART:  PMI not displaced or sustained,S1 and S2 within normal limits, no S3, no S4, no clicks, no rubs, no murmurs ABD:  Flat, positive bowel sounds normal in frequency in  pitch, no bruits, no rebound, no guarding, no midline pulsatile mass, no hepatomegaly, no splenomegaly EXT:  2 plus pulses throughout, no edema, no cyanosis no clubbing   EKG:  EKG is ordered today. The ekg ordered today demonstrates sinus rhythm, rate 68, leftward axis, early transition lead V2, no acute ST-T wave changes.   Recent Labs: 03/01/2020: ALT 16; BUN 13; Creatinine 0.99; Hemoglobin 12.4; Platelet Count 147; Potassium 4.2; Sodium 140; TSH 1.599    Lipid Panel    Component Value Date/Time   CHOL 215 (H)  09/22/2019 1225   TRIG 212 (H) 09/22/2019 1225   HDL 31 (L) 09/22/2019 1225   CHOLHDL 6.9 (H) 09/22/2019 1225   LDLCALC 145 (H) 09/22/2019 1225      Wt Readings from Last 3 Encounters:  03/07/20 233 lb (105.7 kg)  03/01/20 238 lb 3.2 oz (108 kg)  02/09/20 238 lb 9.6 oz (108.2 kg)      Other studies Reviewed: Additional studies/ records that were reviewed today include: Labs, CT Oncology notes. Review of the above records demonstrates:  Please see elsewhere in the note.     ASSESSMENT AND PLAN:   CAD -  He had nonobstructive disease before.  However, he has symptoms.  I would like to screen him with a POET (Plain Old Exercise Treadmill)  ESSENTIAL HYPERTENSION, BENIGN -  His blood pressure is well controlled.  Continue the meds as listed.   PALPITATIONS -   These are controlled on the current dose of medications.  No change in therapy.   DYSLIPIDEMIA -  He does have evidence of vascular disease.  He is high risk findings otherwise.  His goal LDL will be less than 100.  He is not tolerating statins.  Therefore, I am going to refer him to our lipid clinic for PCSK9 inhibitor.  Current medicines are reviewed at length with the patient today.  The patient does not have concerns regarding medicines.  The following changes have been made:  As above.   Labs/ tests ordered today include:   Orders Placed This Encounter  Procedures  . AMB Referral to [facility]  . Exercise Tolerance Test  . EKG 12-Lead     Disposition:   FU with with me in one year.     Signed, [name], MD  03/07/2020 11:42 AM    [facility]

## 2020-03-07 ENCOUNTER — Ambulatory Visit (INDEPENDENT_AMBULATORY_CARE_PROVIDER_SITE_OTHER): Payer: Medicare Other | Admitting: Cardiology

## 2020-03-07 ENCOUNTER — Encounter: Payer: Self-pay | Admitting: Cardiology

## 2020-03-07 ENCOUNTER — Other Ambulatory Visit: Payer: Self-pay

## 2020-03-07 VITALS — BP 115/73 | HR 82 | Temp 97.2°F | Ht 74.0 in | Wt 233.0 lb

## 2020-03-07 DIAGNOSIS — R002 Palpitations: Secondary | ICD-10-CM | POA: Diagnosis not present

## 2020-03-07 DIAGNOSIS — E785 Hyperlipidemia, unspecified: Secondary | ICD-10-CM

## 2020-03-07 DIAGNOSIS — R072 Precordial pain: Secondary | ICD-10-CM | POA: Diagnosis not present

## 2020-03-07 DIAGNOSIS — I10 Essential (primary) hypertension: Secondary | ICD-10-CM

## 2020-03-07 NOTE — Patient Instructions (Signed)
Medication Instructions:  NO CHANGE *If you need a refill on your cardiac medications before your next appointment, please call your pharmacy*   Lab Work: If you have labs (blood work) drawn today and your tests are completely normal, you will receive your results only by: Marland Kitchen MyChart Message (if you have MyChart) OR . A paper copy in the mail If you have any lab test that is abnormal or we need to change your treatment, we will call you to review the results.   Testing/Procedures:  Your physician has requested that you have an exercise tolerance test. For further information please visit [email or web address]. Please also follow instruction sheet, as given.[facility]     Follow-Up: At [facility], you and your health needs are our priority.  As part of our continuing mission to provide you with exceptional heart care, we have created designated Provider Care Teams.  These Care Teams include your primary Cardiologist (physician) and Advanced Practice Providers (APPs -  Physician Assistants and Nurse Practitioners) who all work together to provide you with the care you need, when you need it.  We recommend signing up for the patient portal called "MyChart".  Sign up information is provided on this After Visit Summary.  MyChart is used to connect with patients for Virtual Visits (Telemedicine).  Patients are able to view lab/test results, encounter notes, upcoming appointments, etc.  Non-urgent messages can be sent to your provider as well.   To learn more about what you can do with MyChart, go to [email or web address].    Your next appointment:   12 month(s)  The format for your next appointment:   Either In Person or Virtual  Provider:   You may see [name] MD or one of the following Advanced Practice Providers on your designated Care Team:    Rosaria Ferries, PA-C  Jory Sims, DNP, ANP  Cadence Kathlen Mody, NP    Other Instructions  NEW PATIENT [place] MD [place]

## 2020-03-21 ENCOUNTER — Inpatient Hospital Stay: Payer: Medicare Other | Attending: Oncology | Admitting: Internal Medicine

## 2020-03-21 ENCOUNTER — Encounter: Payer: Self-pay | Admitting: Internal Medicine

## 2020-03-21 ENCOUNTER — Other Ambulatory Visit: Payer: Self-pay

## 2020-03-21 ENCOUNTER — Inpatient Hospital Stay: Payer: Medicare Other

## 2020-03-21 VITALS — BP 102/70 | HR 72 | Temp 97.3°F | Resp 18 | Ht 74.0 in | Wt 235.5 lb

## 2020-03-21 DIAGNOSIS — C773 Secondary and unspecified malignant neoplasm of axilla and upper limb lymph nodes: Secondary | ICD-10-CM | POA: Insufficient documentation

## 2020-03-21 DIAGNOSIS — E785 Hyperlipidemia, unspecified: Secondary | ICD-10-CM | POA: Insufficient documentation

## 2020-03-21 DIAGNOSIS — M109 Gout, unspecified: Secondary | ICD-10-CM | POA: Diagnosis not present

## 2020-03-21 DIAGNOSIS — I10 Essential (primary) hypertension: Secondary | ICD-10-CM | POA: Diagnosis not present

## 2020-03-21 DIAGNOSIS — C3491 Malignant neoplasm of unspecified part of right bronchus or lung: Secondary | ICD-10-CM | POA: Diagnosis not present

## 2020-03-21 DIAGNOSIS — Z95828 Presence of other vascular implants and grafts: Secondary | ICD-10-CM

## 2020-03-21 DIAGNOSIS — Z79899 Other long term (current) drug therapy: Secondary | ICD-10-CM | POA: Diagnosis not present

## 2020-03-21 DIAGNOSIS — I2510 Atherosclerotic heart disease of native coronary artery without angina pectoris: Secondary | ICD-10-CM | POA: Insufficient documentation

## 2020-03-21 DIAGNOSIS — E039 Hypothyroidism, unspecified: Secondary | ICD-10-CM

## 2020-03-21 DIAGNOSIS — C7801 Secondary malignant neoplasm of right lung: Secondary | ICD-10-CM

## 2020-03-21 DIAGNOSIS — Z5112 Encounter for antineoplastic immunotherapy: Secondary | ICD-10-CM

## 2020-03-21 DIAGNOSIS — C7802 Secondary malignant neoplasm of left lung: Secondary | ICD-10-CM | POA: Diagnosis not present

## 2020-03-21 DIAGNOSIS — Z85828 Personal history of other malignant neoplasm of skin: Secondary | ICD-10-CM | POA: Insufficient documentation

## 2020-03-21 LAB — CBC WITH DIFFERENTIAL (CANCER CENTER ONLY)
Abs Immature Granulocytes: 0.02 10*3/uL (ref 0.00–0.07)
Basophils Absolute: 0 10*3/uL (ref 0.0–0.1)
Basophils Relative: 1 %
Eosinophils Absolute: 0.3 10*3/uL (ref 0.0–0.5)
Eosinophils Relative: 7 %
HCT: 36.4 % — ABNORMAL LOW (ref 39.0–52.0)
Hemoglobin: 12.4 g/dL — ABNORMAL LOW (ref 13.0–17.0)
Immature Granulocytes: 0 %
Lymphocytes Relative: 20 %
Lymphs Abs: 1 10*3/uL (ref 0.7–4.0)
MCH: 31.2 pg (ref 26.0–34.0)
MCHC: 34.1 g/dL (ref 30.0–36.0)
MCV: 91.7 fL (ref 80.0–100.0)
Monocytes Absolute: 0.8 10*3/uL (ref 0.1–1.0)
Monocytes Relative: 16 %
Neutro Abs: 2.6 10*3/uL (ref 1.7–7.7)
Neutrophils Relative %: 56 %
Platelet Count: 147 10*3/uL — ABNORMAL LOW (ref 150–400)
RBC: 3.97 MIL/uL — ABNORMAL LOW (ref 4.22–5.81)
RDW: 12 % (ref 11.5–15.5)
WBC Count: 4.7 10*3/uL (ref 4.0–10.5)
nRBC: 0 % (ref 0.0–0.2)

## 2020-03-21 LAB — CMP (CANCER CENTER ONLY)
ALT: 14 U/L (ref 0–44)
AST: 15 U/L (ref 15–41)
Albumin: 3.7 g/dL (ref 3.5–5.0)
Alkaline Phosphatase: 62 U/L (ref 38–126)
Anion gap: 8 (ref 5–15)
BUN: 12 mg/dL (ref 8–23)
CO2: 24 mmol/L (ref 22–32)
Calcium: 8.9 mg/dL (ref 8.9–10.3)
Chloride: 110 mmol/L (ref 98–111)
Creatinine: 0.92 mg/dL (ref 0.61–1.24)
GFR, Est AFR Am: >60 mL/min (ref >60)
GFR, Estimated: >60 mL/min (ref >60)
Glucose, Bld: 101 mg/dL — ABNORMAL HIGH (ref 70–99)
Potassium: 4 mmol/L (ref 3.5–5.1)
Sodium: 142 mmol/L (ref 135–145)
Total Bilirubin: 0.3 mg/dL (ref 0.3–1.2)
Total Protein: 7.2 g/dL (ref 6.5–8.1)

## 2020-03-21 LAB — TSH: TSH: 1.448 u[IU]/mL (ref 0.320–4.118)

## 2020-03-21 MED ORDER — SODIUM CHLORIDE 0.9 % IV SOLN
Freq: Once | INTRAVENOUS | Status: AC
  Filled 2020-03-21: qty 250

## 2020-03-21 MED ORDER — HEPARIN SOD (PORK) LOCK FLUSH 100 UNIT/ML IV SOLN
500.0000 [IU] | Freq: Once | INTRAVENOUS | Status: AC | PRN
  Administered 2020-03-21: 500 [IU]
  Filled 2020-03-21: qty 5

## 2020-03-21 MED ORDER — SODIUM CHLORIDE 0.9% FLUSH
10.0000 mL | Freq: Once | INTRAVENOUS | Status: AC
  Administered 2020-03-21: 10 mL
  Filled 2020-03-21: qty 10

## 2020-03-21 MED ORDER — SODIUM CHLORIDE 0.9 % IV SOLN
200.0000 mg | Freq: Once | INTRAVENOUS | Status: AC
  Administered 2020-03-21: 200 mg via INTRAVENOUS
  Filled 2020-03-21: qty 8

## 2020-03-21 MED ORDER — SODIUM CHLORIDE 0.9% FLUSH
10.0000 mL | INTRAVENOUS | Status: DC | PRN
  Administered 2020-03-21: 10 mL
  Filled 2020-03-21: qty 10

## 2020-03-21 NOTE — Progress Notes (Signed)
[place] Telephone:([phone]   [phone]  OFFICE PROGRESS NOTE  Travis Norlander, DO [place] [address]  DIAGNOSIS: Stage IV (T3, N0, M1c) non-small cell lung cancer, squamous cell carcinoma presented with large right infrahilar mass in addition to left upper lobe lung nodule as well as left axillary mass with left axillary lymph node diagnosed in August 2019.  PRIOR THERAPY:  1) Palliative radiotherapy to the right infrahilar mass as well as the axillary mass under the care of Dr. Lisbeth Renshaw. 2) Systemic chemotherapy with carboplatin for AUC of 5, paclitaxel 175 mg/M2 and Keytruda 200 mg IV every 3 weeks status post 4 cycles.  CURRENT THERAPY: Maintenance immunotherapy with single agent Keytruda 200 mg IV every 3 weeks status post 29 cycles.  INTERVAL HISTORY: Travis Padilla 74 y.o. male returns to the clinic today for follow-up visit.  The patient is feeling fine today with no concerning complaints.  The patient has no current chest pain, shortness of breath, cough or hemoptysis.  He has no nausea, vomiting, diarrhea or constipation.  She denied having any headache or visual changes.  He has no recent weight loss or night sweats.  He continues to tolerate his treatment with maintenance Keytruda fairly well.  MEDICAL HISTORY: Past Medical History:  Diagnosis Date  . Abnormal nuclear stress test    December, 2013  . Anemia   . Axillary adenopathy 04/24/2018  . CAD (coronary artery disease)    Mild nonobstructive plaque in cath 2013  . Chest pain    December, 2013  . Cough with hemoptysis 04/22/2018  . Dizziness   . Dyslipidemia 07/14/2019  . Encounter for antineoplastic immunotherapy 05/13/2018  . Gout   . Hemorrhoid   . Hyperlipidemia   . Hypertension   . Hypothyroidism (acquired) 08/11/2018  . 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 Obesity, unspecified 03/28/2009   Qualifier: Diagnosis of  By: [name], MD, [name]    . Palpitations 03/28/2009   Qualifier: Diagnosis of  By: [name], MD, [name]    . Right lower lobe lung mass 04/22/2018  . Skin cancer   . SNORING 03/28/2009   Qualifier: Diagnosis of  By: [name], MD, [name]    . Spinal stenosis   . 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[place]) 05/13/2018    ALLERGIES:  has no active allergies.  MEDICATIONS:  Current Outpatient Medications  Medication Sig Dispense Refill  . Apple Cider Vinegar 500 MG TABS Take by mouth.    . Artificial Tear Solution (SOOTHE XP OP) Place 1 drop into both eyes 2 (two) times daily.    . benzonatate (TESSALON) 200 MG capsule Take 1 capsule (200 mg total) by mouth 2 (two) times daily as needed for cough. 20 capsule 0  . carvedilol (COREG) 12.5 MG tablet Take 1 tablet (12.5 mg total) by mouth in the morning, at noon, and at bedtime. 270 tablet 3  . cefdinir (OMNICEF) 300 MG capsule Take 1 capsule (300 mg total) by mouth 2 (two) times daily. 1 po BID 20 capsule 0  . Cyanocobalamin (VITAMIN B 12) 250 MCG LOZG Take by mouth.    . diphenhydramine-acetaminophen (TYLENOL PM) 25-500 MG TABS tablet Take 1-2 tablets by mouth at bedtime as needed (pain).    . ferrous  sulfate 324 MG TBEC Take 324 mg by mouth daily with breakfast.    . hydrOXYzine (ATARAX/VISTARIL) 10 MG tablet Take 1 tablet (10 mg total) by mouth 3 (three) times daily as needed for itching. 30 tablet 2  . levothyroxine (SYNTHROID) 150 MCG tablet TAKE 1 TABLET BY MOUTH EVERY DAY BEFORE BREAKFAST 30 tablet 0  . lidocaine-prilocaine (EMLA) cream Apply 1 application topically as needed. 30 g 1  . methocarbamol (ROBAXIN) 500 MG tablet Take 500 mg by mouth daily.    . Multiple Vitamins-Minerals (PRESERVISION AREDS 2 PO) Take by mouth.    . oxyCODONE-acetaminophen (PERCOCET/ROXICET) 5-325 MG tablet Take 1 tablet by mouth every 8 (eight)  hours as needed for severe pain. 30 tablet 0  . pravastatin (PRAVACHOL) 40 MG tablet Take 1 tablet (40 mg total) by mouth every evening. 90 tablet 3  . senna-docusate (SENNA S) 8.6-50 MG tablet 1 to 2 twice daily for constipation 120 tablet 5  . traZODone (DESYREL) 50 MG tablet Take 1-2 tablets (50-100 mg total) by mouth at bedtime as needed for sleep. 180 tablet 0   No current facility-administered medications for this visit.    SURGICAL HISTORY:  Past Surgical History:  Procedure Laterality Date  . 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 BELPHAROPTOSIS REPAIR Bilateral   . CATARACT EXTRACTION W/ INTRAOCULAR LENS  IMPLANT, BILATERAL    . COLONOSCOPY N/A 11/03/2014   Procedure: COLONOSCOPY;  Surgeon: Rogene Houston, MD;  Location: [facility];  Service: Endoscopy;  Laterality: N/A;  1225  . IR IMAGING GUIDED PORT INSERTION  07/11/2018  . KNEE CARTILAGE SURGERY Left    Left knee  . SKIN CANCER EXCISION  12/2014, 04/25/15  . VIDEO BRONCHOSCOPY Bilateral 05/09/2018   Procedure: VIDEO BRONCHOSCOPY WITH FLUORO;  Surgeon: Juanito Doom, MD;  Location: [facility];  Service: Cardiopulmonary;  Laterality: Bilateral;    REVIEW OF SYSTEMS:  A comprehensive review of systems was negative.   PHYSICAL EXAMINATION: General appearance: alert, cooperative and no distress Head: Normocephalic, without obvious abnormality, atraumatic Neck: no adenopathy, no JVD, supple, symmetrical, trachea midline and thyroid not enlarged, symmetric, no tenderness/mass/nodules Lymph nodes: Cervical, supraclavicular, and axillary nodes normal. Resp: clear to auscultation bilaterally Back: symmetric, no curvature. ROM normal. No CVA tenderness. Cardio: regular rate and rhythm, S1, S2 normal, no murmur, click, rub or gallop GI: soft, non-tender; bowel sounds normal; no masses,  no organomegaly Extremities: extremities normal, atraumatic, no cyanosis or edema  ECOG PERFORMANCE STATUS: 1 - Symptomatic but completely  ambulatory  Blood pressure 102/70, pulse 72, temperature (!) 97.3 F (36.3 C), temperature source Temporal, resp. rate 18, height 6\' 2"  (1.88 m), weight 235 lb 8 oz (106.8 kg), SpO2 99 %.  LABORATORY DATA: Lab Results  Component Value Date   WBC 4.7 03/21/2020   HGB 12.4 (L) 03/21/2020   HCT 36.4 (L) 03/21/2020   MCV 91.7 03/21/2020   PLT 147 (L) 03/21/2020      Chemistry      Component Value Date/Time   NA 140 03/01/2020 1002   NA 144 04/09/2018 1507   K 4.2 03/01/2020 1002   CL 108 03/01/2020 1002   CO2 25 03/01/2020 1002   BUN 13 03/01/2020 1002   BUN 15 04/09/2018 1507   CREATININE 0.99 03/01/2020 1002      Component Value Date/Time   CALCIUM 8.9 03/01/2020 1002   ALKPHOS 60 03/01/2020 1002   AST 16 03/01/2020 1002   ALT 16 03/01/2020 1002  BILITOT 0.5 03/01/2020 1002       RADIOGRAPHIC STUDIES: CT Chest W Contrast  Result Date: 02/29/2020 CLINICAL DATA:  Restaging right lung cancer, low back pain, ongoing immunotherapy, radiotherapy and chemotherapy complete EXAM: CT CHEST, ABDOMEN, AND PELVIS WITH CONTRAST TECHNIQUE: Multidetector CT imaging of the chest, abdomen and pelvis was performed following the standard protocol during bolus administration of intravenous contrast. CONTRAST:  163mL OMNIPAQUE IOHEXOL 300 MG/ML SOLN, additional oral enteric contrast COMPARISON:  11/16/2019 FINDINGS: CT CHEST FINDINGS Cardiovascular: Right chest port catheter. Aortic atherosclerosis. Normal heart size. Three-vessel coronary artery calcifications and stents. No pericardial effusion. Mediastinum/Nodes: No enlarged mediastinal, hilar, or axillary lymph nodes. Thyroid gland, trachea, and esophagus demonstrate no significant findings. Lungs/Pleura: No significant interval change in a flattened, lobulated mass of the lingula measuring 2.6 x 2.4 cm, previously 2.6 x 2.6 cm when measured similarly (series 6, image 77). Unchanged post treatment appearance of the right hilum and perihilar  right lung with fibrotic scarring and volume loss. No pleural effusion or pneumothorax. Musculoskeletal: Unchanged spiculated soft tissue nodule in the left axilla measuring 1.9 x 1.2 cm (series 2, image 8). No suspicious bone lesions identified. CT ABDOMEN PELVIS FINDINGS Hepatobiliary: No solid liver abnormality is seen. No gallstones, gallbladder wall thickening, or biliary dilatation. Pancreas: Unremarkable. No pancreatic ductal dilatation or surrounding inflammatory changes. Spleen: Normal in size without significant abnormality. Adrenals/Urinary Tract: Adrenal glands are unremarkable. Multiple small nonobstructive bilateral renal calculi. No hydronephrosis. Bladder is unremarkable. Stomach/Bowel: Stomach is within normal limits. Appendix appears normal. No evidence of bowel wall thickening, distention, or inflammatory changes. Sigmoid diverticulosis. Vascular/Lymphatic: Aortic atherosclerosis. No enlarged abdominal or pelvic lymph nodes. Reproductive: No mass or other abnormality. Other: No abdominal wall hernia or abnormality. No abdominopelvic ascites. Musculoskeletal: No acute or significant osseous findings. IMPRESSION: 1. Unchanged post treatment appearance of the right hilum and perihilar right lung with fibrotic scarring and volume loss, consistent with prior radiation therapy. 2. No significant interval change in a flattened, lobulated mass of the lingula measuring 2.6 x 2.4 cm, previously 2.6 x 2.6 cm when measured similarly. 3. Unchanged spiculated soft tissue nodule in the left axilla measuring 1.9 x 1.2 cm. 4. No evidence of metastatic disease within the abdomen or pelvis. 5. Nonobstructive bilateral nephrolithiasis. 6. Coronary artery disease.  Aortic Atherosclerosis (ICD10-I70.0). Electronically Signed   By: Eddie Candle M.D.   On: 02/29/2020 16:30   CT Abdomen Pelvis W Contrast  Result Date: 02/29/2020 CLINICAL DATA:  Restaging right lung cancer, low back pain, ongoing immunotherapy,  radiotherapy and chemotherapy complete EXAM: CT CHEST, ABDOMEN, AND PELVIS WITH CONTRAST TECHNIQUE: Multidetector CT imaging of the chest, abdomen and pelvis was performed following the standard protocol during bolus administration of intravenous contrast. CONTRAST:  119mL OMNIPAQUE IOHEXOL 300 MG/ML SOLN, additional oral enteric contrast COMPARISON:  11/16/2019 FINDINGS: CT CHEST FINDINGS Cardiovascular: Right chest port catheter. Aortic atherosclerosis. Normal heart size. Three-vessel coronary artery calcifications and stents. No pericardial effusion. Mediastinum/Nodes: No enlarged mediastinal, hilar, or axillary lymph nodes. Thyroid gland, trachea, and esophagus demonstrate no significant findings. Lungs/Pleura: No significant interval change in a flattened, lobulated mass of the lingula measuring 2.6 x 2.4 cm, previously 2.6 x 2.6 cm when measured similarly (series 6, image 77). Unchanged post treatment appearance of the right hilum and perihilar right lung with fibrotic scarring and volume loss. No pleural effusion or pneumothorax. Musculoskeletal: Unchanged spiculated soft tissue nodule in the left axilla measuring 1.9 x 1.2 cm (series 2, image 8). No suspicious bone lesions  identified. CT ABDOMEN PELVIS FINDINGS Hepatobiliary: No solid liver abnormality is seen. No gallstones, gallbladder wall thickening, or biliary dilatation. Pancreas: Unremarkable. No pancreatic ductal dilatation or surrounding inflammatory changes. Spleen: Normal in size without significant abnormality. Adrenals/Urinary Tract: Adrenal glands are unremarkable. Multiple small nonobstructive bilateral renal calculi. No hydronephrosis. Bladder is unremarkable. Stomach/Bowel: Stomach is within normal limits. Appendix appears normal. No evidence of bowel wall thickening, distention, or inflammatory changes. Sigmoid diverticulosis. Vascular/Lymphatic: Aortic atherosclerosis. No enlarged abdominal or pelvic lymph nodes. Reproductive: No mass or  other abnormality. Other: No abdominal wall hernia or abnormality. No abdominopelvic ascites. Musculoskeletal: No acute or significant osseous findings. IMPRESSION: 1. Unchanged post treatment appearance of the right hilum and perihilar right lung with fibrotic scarring and volume loss, consistent with prior radiation therapy. 2. No significant interval change in a flattened, lobulated mass of the lingula measuring 2.6 x 2.4 cm, previously 2.6 x 2.6 cm when measured similarly. 3. Unchanged spiculated soft tissue nodule in the left axilla measuring 1.9 x 1.2 cm. 4. No evidence of metastatic disease within the abdomen or pelvis. 5. Nonobstructive bilateral nephrolithiasis. 6. Coronary artery disease.  Aortic Atherosclerosis (ICD10-I70.0). Electronically Signed   By: Eddie Candle M.D.   On: 02/29/2020 16:30    ASSESSMENT AND PLAN: This is a very pleasant 74 years old white male with very light most smoking history recently diagnosed with stage IV (T3, N0, M1c) non-small cell lung cancer, squamous cell carcinoma based on the biopsy from the left axillary mass. He status post 4 cycles of induction systemic chemotherapy with carboplatin, paclitaxel and Keytruda with partial response.  The patient is currently on maintenance treatment with single agent Keytruda status post 29 cycles. The patient continues to tolerate his treatment well with no concerning adverse effects. I recommended for him to proceed with cycle #30 of his maintenance treatment today as planned. He will come back for follow-up visit in 3 weeks for evaluation before the next cycle of his treatment. For the hypothyroidism, he will continue his current treatment with levothyroxine and will monitor his TSH closely. He was advised to call immediately if he has any concerning symptoms in the interval. The patient voices understanding of current disease status and treatment options and is in agreement with the current care plan. All questions were  answered. The patient knows to call the clinic with any problems, questions or concerns. We can certainly see the patient much sooner if necessary.  Disclaimer: This note was dictated with voice recognition software. Similar sounding words can inadvertently be transcribed and may not be corrected upon review.

## 2020-03-22 ENCOUNTER — Telehealth: Payer: Self-pay | Admitting: Internal Medicine

## 2020-03-22 NOTE — Telephone Encounter (Signed)
Scheduled appt per 7/12 los - pt aware of appt dates and times added

## 2020-03-24 ENCOUNTER — Other Ambulatory Visit: Payer: Self-pay | Admitting: Internal Medicine

## 2020-03-24 DIAGNOSIS — C3491 Malignant neoplasm of unspecified part of right bronchus or lung: Secondary | ICD-10-CM

## 2020-03-29 ENCOUNTER — Other Ambulatory Visit: Payer: Self-pay | Admitting: Family Medicine

## 2020-03-29 DIAGNOSIS — F5101 Primary insomnia: Secondary | ICD-10-CM

## 2020-03-29 NOTE — Telephone Encounter (Signed)
Last OV 01/06/20. Last RF 01/06/20. Next OV not scheduled

## 2020-04-05 ENCOUNTER — Telehealth (HOSPITAL_COMMUNITY): Payer: Self-pay

## 2020-04-05 NOTE — Telephone Encounter (Signed)
Encounter complete. 

## 2020-04-06 NOTE — Progress Notes (Signed)
Patient ID: Travis Padilla                 DOB: December 10, 1945                    MRN: [id number]     HPI: Travis Padilla is a 74 y.o. male patient referred to lipid clinic by Dr [name]. PMH is significant for CAD, chest pain, hyperlipidemia, hypertension, small cell lung cancer stage IV, and palpitations. CT demonstrates aortic atherosclerosis as well as coronary artery calcification. Also noted strong family history of early onset ASCVD with MI in father at age 51. LDL at 145 mg/dL and LFT within normal limits. Patient experienced neuropathies with pravastatin and worsen since initiating chemothjerapy. Oncologist recommend against re-trail with statins. He stopped taking his pravastatin only 3 weeks ago.  Current Medications: none   Intolerances: Pravastatin 40mg  daily - numbness on feet, lack sensation, issues with balance (worsen since intation chemotherapy)   LDL goal: < 70 mg/dL  Diet:  yogurt, oatmeal, trail mix - breakfast (donut or corn muffin occasional) Sandwich or sushi- lunch Protein (chicken , pork or steak) , potatoes, corns or greens , regular salasd intake(occasional pizza or chinese)  Exercise: yoga once per week, spin class weekly, resistant wights  Family History: father (1st MI age 67),   Social History: EtOH problem - stopped 10 years ago, no tobacco  Labs: Jan/12/21: CHO 215, TG 212, HDL 31, LDL-c 145 (pravastatin 40mg  daily)  Past Medical History:  Diagnosis Date  . Abnormal nuclear stress test    December, 2013  . Anemia   . Axillary adenopathy 04/24/2018  . CAD (coronary artery disease)    Mild nonobstructive plaque in cath 2013  . Chest pain    December, 2013  . Cough with hemoptysis 04/22/2018  . Dizziness   . Dyslipidemia 07/14/2019  . Encounter for antineoplastic immunotherapy 05/13/2018  . Gout   . Hemorrhoid   . Hyperlipidemia   . Hypertension   . Hypothyroidism (acquired) 08/11/2018  . 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 Metastatic lung cancer  (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 other site) ([place]) dx'd 04/17/18   to LN, infrahilar mass, lung nodule and lt axilla  . Obesity, unspecified 03/28/2009   Qualifier: Diagnosis of  By: [name], MD, [name]    . Palpitations 03/28/2009   Qualifier: Diagnosis of  By: [name], MD, [name]    . Right lower lobe lung mass 04/22/2018  . Skin cancer   . SNORING 03/28/2009   Qualifier: Diagnosis of  By: [name], MD, [name]    . Spinal stenosis   . 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[place]) 05/13/2018    Current Outpatient Medications on File Prior to Visit  Medication Sig Dispense Refill  . Apple Cider Vinegar 500 MG TABS Take by mouth.    . Artificial Tear Solution (SOOTHE XP OP) Place 1 drop into both eyes 2 (two) times daily.    . carvedilol (COREG) 12.5 MG tablet Take 1 tablet (12.5 mg total) by mouth in the morning, at noon, and at bedtime. 270 tablet 3  . Cyanocobalamin (VITAMIN B 12) 250 MCG LOZG Take by mouth.    . diphenhydramine-acetaminophen (TYLENOL PM) 25-500 MG TABS tablet Take 1-2 tablets by mouth at bedtime as needed (pain).    . ferrous sulfate 324 MG TBEC Take 324 mg by mouth daily with breakfast.    . levothyroxine (SYNTHROID) 150 MCG tablet TAKE 1 TABLET BY  MOUTH EVERY DAY BEFORE BREAKFAST 30 tablet 0  . lidocaine-prilocaine (EMLA) cream Apply 1 application topically as needed. 30 g 1  . methocarbamol (ROBAXIN) 500 MG tablet Take 500 mg by mouth daily.    . Multiple Vitamins-Minerals (PRESERVISION AREDS 2 PO) Take by mouth.    . oxyCODONE-acetaminophen (PERCOCET/ROXICET) 5-325 MG tablet Take 1 tablet by mouth every 8 (eight) hours as needed for severe pain. 30 tablet 0  . senna-docusate (SENNA S) 8.6-50 MG tablet 1 to 2 twice daily for constipation 120 tablet 5  . traZODone (DESYREL) 50 MG tablet TAKE 1-2 TABLETS BY MOUTH AT BEDTIME AS NEEDED FOR SLEEP. 180 tablet 0   No current facility-administered medications on file prior to visit.    Allergies  Allergen  Reactions  . Pravastatin     Numbness in feet     Hyperlipidemia LDL above goal, while on higher tolerated statin dose, for secondary prevention. Also noted family history of early onset ASCVD with MI in father at age 43.  Noted neuropathies with pravastatin once chemotherapy started.  We discussed PCSK9i  MOA, storage, administration, common side effects, monitoring, prior-authorization process, and available patient assistance. Will start PA for Repatha/Praluent every 14 days and follow up as needed.  Plan to repeat fasting lipid panel and LFTs aroung the 5th injection of new medication.   Rudy Luhmann Rodriguez-Guzman PharmD, BCPS, [place] [address] [place] [address] 04/12/2020 1:25 PM

## 2020-04-07 ENCOUNTER — Ambulatory Visit (INDEPENDENT_AMBULATORY_CARE_PROVIDER_SITE_OTHER): Payer: Medicare Other | Admitting: Pharmacist

## 2020-04-07 ENCOUNTER — Telehealth: Payer: Self-pay

## 2020-04-07 ENCOUNTER — Ambulatory Visit (HOSPITAL_COMMUNITY)
Admission: RE | Admit: 2020-04-07 | Discharge: 2020-04-07 | Disposition: A | Discharge status: Home / Self Care | Payer: Medicare Other | Source: Ambulatory Visit | Attending: Cardiovascular Disease | Admitting: Cardiovascular Disease

## 2020-04-07 ENCOUNTER — Other Ambulatory Visit: Payer: Self-pay

## 2020-04-07 VITALS — BP 106/60 | HR 84 | Resp 16 | Ht 74.0 in | Wt 235.6 lb

## 2020-04-07 DIAGNOSIS — G620 Drug-induced polyneuropathy: Secondary | ICD-10-CM | POA: Diagnosis not present

## 2020-04-07 DIAGNOSIS — R072 Precordial pain: Secondary | ICD-10-CM

## 2020-04-07 DIAGNOSIS — E782 Mixed hyperlipidemia: Secondary | ICD-10-CM | POA: Diagnosis not present

## 2020-04-07 DIAGNOSIS — E7801 Familial hypercholesterolemia: Secondary | ICD-10-CM

## 2020-04-07 LAB — EXERCISE TOLERANCE TEST
Estimated workload: 7 METS
Exercise duration (min): 6 min
Exercise duration (sec): 2 s
MPHR: 148 {beats}/min
Peak HR: 141 {beats}/min
Percent HR: 95 %
Rest HR: 73 {beats}/min

## 2020-04-07 NOTE — Patient Instructions (Addendum)
Your Results:             Your most recent labs Goal  Total Cholesterol 215 < 200  Triglycerides 212 < 150  HDL (happy/good cholesterol) 31 > 40  LDL (lousy/bad cholesterol 145 < 100   Medication changes: *Will apply for Repatha/Praluent prior authorization to your insurance*   Lab orders: *Plan to repeat fasting blood work in 2 months*  Phone number: *Lissy Deuser/[place]rist[place]* [phone]  Patient Assistance:  The [employer] foundation offers assistance to help pay for medication copays.  They will cover copays for all cholesterol lowering meds, including statins, fibrates, omega-3 oils, ezetimibe, Repatha, Praluent, Nexletol, Nexlizet.  The cards are usually good for $2,500 or 12 months, whichever comes first. 1. Go to healthwellfoundation.org 2. Click on "Apply Now" 3. Answer questions as to whom is applying (patient or representative) 4. Your disease fund will be "hypercholesterolemia - Medicare access" 5. They will ask questions about finances and which medications you are taking for cholesterol 6. When you submit, the approval is usually within minutes.  You will need to print the card information from the site 7. You will need to show this information to your pharmacy, they will bill your Medicare Part D plan first -then bill [employer] --for the copay.   You can also call them at [phone], although the hold times can be quite long.   Thank you for choosing [facility]

## 2020-04-07 NOTE — Telephone Encounter (Signed)
LMOMED the pt to call us back or to take drug insurance card to our [place] office and scan it in for [place] asap so that I can get them on the [id number] sooner

## 2020-04-08 NOTE — Progress Notes (Signed)
[facility] OFFICE PROGRESS NOTE  Travis Norlander, DO [place] [address]  DIAGNOSIS: Stage IV (T3, N0, M1c) non-small cell lung cancer, squamous cell carcinoma presented with large right infrahilar mass in addition to left upper lobe lung nodule as well as left axillary mass with left axillary lymph node diagnosed in August 2019.  PRIOR THERAPY: 1) Palliative radiotherapy to the right infrahilar mass as well as the axillary mass under the care of Dr. Lisbeth Renshaw.  CURRENT THERAPY:  Systemic chemotherapy with carboplatin for AUC of 5, paclitaxel 175 mg/M2 and Keytruda 200 mg IV every 3 weeks. Status post 34 cycles. Starting from cycle #5, the patient has been on maintenance immunotherapy with single agent Keytruda 200 mg IV every 3 weeks.  INTERVAL HISTORY: Travis Padilla 74 y.o. male returns to the clinic for a follow up visit. The patient is feeling fairly well today without any concerning complaints. He is staying active with spin classes. 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 He completed physical therapy for this. He noticed this past week that his back has not been hurting him as bad.The patient continues to tolerate treatment with [facility] well without any adverse effects except for mild generalized itching without rash. He has a prescription for atarax. He has not noticed itching recently.  Denies any fever, night sweats, or weight loss. Denies any chest pain, shortness of breath, cough, or hemoptysis. Denies any nausea or vomiting. He occasionally experiences both diarrhea and constipation but nothing out of the ordinary for him. He denies headaches. Denies associated changes with his balance, seizures, or new visual symptoms. He has baseline visual issues for which he has implants. The patient is here today for evaluation prior to starting cycle # 35 of keytruda  MEDICAL HISTORY: Past Medical History:  Diagnosis Date  . Abnormal nuclear stress  test    December, 2013  . Anemia   . Axillary adenopathy 04/24/2018  . CAD (coronary artery disease)    Mild nonobstructive plaque in cath 2013  . Chest pain    December, 2013  . Cough with hemoptysis 04/22/2018  . Dizziness   . Dyslipidemia 07/14/2019  . Encounter for antineoplastic immunotherapy 05/13/2018  . Gout   . Hemorrhoid   . Hyperlipidemia   . Hypertension   . Hypothyroidism (acquired) 08/11/2018  . 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 Obesity, unspecified 03/28/2009   Qualifier: Diagnosis of  By: [name], MD, [name]    . Palpitations 03/28/2009   Qualifier: Diagnosis of  By: [name], MD, [name]    . Right lower lobe lung mass 04/22/2018  . Skin cancer   . SNORING 03/28/2009   Qualifier: Diagnosis of  By: [name], MD, [name]    . Spinal stenosis   . 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[place]) 05/13/2018    ALLERGIES:  is allergic to pravastatin.  MEDICATIONS:  Current Outpatient Medications  Medication Sig Dispense Refill  . Alirocumab (PRALUENT) 150 MG/ML SOAJ Inject 150 mg into the skin every 14 (fourteen) days. 2 pen 11  . Apple Cider Vinegar 500 MG TABS Take by mouth.    . Artificial Tear Solution (SOOTHE XP OP) Place 1 drop into both eyes 2 (two) times daily.    . carvedilol (COREG) 12.5 MG tablet Take  1 tablet (12.5 mg total) by mouth in the morning, at noon, and at bedtime. 270 tablet 3  . Cyanocobalamin (VITAMIN B 12) 250 MCG LOZG Take by mouth.    . diphenhydramine-acetaminophen (TYLENOL PM) 25-500 MG TABS tablet Take 1-2 tablets by mouth at bedtime as needed (pain).    . ferrous sulfate 324 MG TBEC Take 324 mg by mouth daily with breakfast.    . levothyroxine (SYNTHROID) 150 MCG tablet TAKE 1 TABLET BY MOUTH EVERY DAY BEFORE BREAKFAST 30 tablet 0  . lidocaine-prilocaine (EMLA) cream Apply 1 application  topically as needed. 30 g 1  . methocarbamol (ROBAXIN) 500 MG tablet Take 500 mg by mouth daily.    . Multiple Vitamins-Minerals (PRESERVISION AREDS 2 PO) Take by mouth.    . oxyCODONE-acetaminophen (PERCOCET/ROXICET) 5-325 MG tablet Take 1 tablet by mouth every 8 (eight) hours as needed for severe pain. 30 tablet 0  . senna-docusate (SENNA S) 8.6-50 MG tablet 1 to 2 twice daily for constipation 120 tablet 5  . traZODone (DESYREL) 50 MG tablet TAKE 1-2 TABLETS BY MOUTH AT BEDTIME AS NEEDED FOR SLEEP. 180 tablet 0   No current facility-administered medications for this visit.    SURGICAL HISTORY:  Past Surgical History:  Procedure Laterality Date  . 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 BELPHAROPTOSIS REPAIR Bilateral   . CATARACT EXTRACTION W/ INTRAOCULAR LENS  IMPLANT, BILATERAL    . COLONOSCOPY N/A 11/03/2014   Procedure: COLONOSCOPY;  Surgeon: Rogene Houston, MD;  [facility];  Service: Endoscopy;  Laterality: N/A;  1225  . IR IMAGING GUIDED PORT INSERTION  07/11/2018  . KNEE CARTILAGE SURGERY Left    Left knee  . SKIN CANCER EXCISION  12/2014, 04/25/15  . VIDEO BRONCHOSCOPY Bilateral 05/09/2018   Procedure: VIDEO BRONCHOSCOPY WITH FLUORO;  Surgeon: Juanito Doom, MD;  Location: [facility] END[facility];  Service: Cardiopulmonary;  Laterality: Bilateral;    REVIEW OF SYSTEMS:   Review of Systems  Constitutional: Positive for fatigue. Negative for appetite change, fever and unexpected weight change.  HENT: Negative for mouth sores, nosebleeds, thrush, sore throat and trouble swallowing.   Eyes: Negative for eye problems and icterus.  Respiratory: Negative for hemoptysis, cough, shortness of breath and wheezing.   Cardiovascular: Negative for chest pain and leg swelling.  Gastrointestinal: Negative for abdominal pain, constipation, diarrhea, nausea and vomiting.  Genitourinary: Negative for bladder incontinence, difficulty urinating, dysuria, frequency and hematuria.    Musculoskeletal: Positive for chronic back pain secondary to hx of herniated disk. Negative for gait problem, neck pain and neck stiffness.  Skin: Negative for rash or itching.  Neurological: Negative for dizziness, extremity weakness, gait problem, headaches, light-headedness and seizures.  Hematological: Negative for adenopathy. Does not bruise/bleed easily.  Psychiatric/Behavioral: Negative for confusion, depression and sleep disturbance. The patient is not nervous/anxious.      PHYSICAL EXAMINATION:  Blood pressure 113/74, pulse 69, temperature 97.7 F (36.5 C), temperature source Temporal, resp. rate 18, height 6\' 2"  (1.88 m), weight 234 lb (106.1 kg), SpO2 100 %.  ECOG PERFORMANCE STATUS: 1 - Symptomatic but completely ambulatory  Physical Exam  Constitutional: Oriented to person, place, and time and well-developed, well-nourished, and in no distress.  HENT:  Head: Normocephalic and atraumatic.  Mouth/Throat: Oropharynx is clear and moist. No oropharyngeal exudate.  Eyes: Conjunctivae are normal. Right eye exhibits no discharge. Left eye exhibits no discharge. No scleral icterus.  Neck: Normal range of motion. Neck supple.  Cardiovascular: Normal rate,  regular rhythm, normal heart sounds and intact distal pulses.   Pulmonary/Chest: Effort normal and breath sounds normal. No respiratory distress. No wheezes. No rales.  Abdominal: Soft. Bowel sounds are normal. Exhibits no distension and no mass. There is no tenderness.  Musculoskeletal: Normal range of motion. Exhibits no edema.  Lymphadenopathy:    No cervical adenopathy.  Neurological: Alert and oriented to person, place, and time. Exhibits normal muscle tone. Gait normal. Coordination normal.  Skin: Skin is warm and dry. No rash noted. Not diaphoretic. No erythema. No pallor.  Psychiatric: Mood, memory and judgment normal.  Vitals reviewed.  LABORATORY DATA: Lab Results  Component Value Date   WBC 5.2 04/12/2020   HGB  12.7 (L) 04/12/2020   HCT 37.2 (L) 04/12/2020   MCV 92.5 04/12/2020   PLT 154 04/12/2020      Chemistry      Component Value Date/Time   NA 142 03/21/2020 0900   NA 144 04/09/2018 1507   K 4.0 03/21/2020 0900   CL 110 03/21/2020 0900   CO2 24 03/21/2020 0900   BUN 12 03/21/2020 0900   BUN 15 04/09/2018 1507   CREATININE 0.92 03/21/2020 0900      Component Value Date/Time   CALCIUM 8.9 03/21/2020 0900   ALKPHOS 62 03/21/2020 0900   AST 15 03/21/2020 0900   ALT 14 03/21/2020 0900   BILITOT 0.3 03/21/2020 0900       RADIOGRAPHIC STUDIES:  Exercise Tolerance Test  Result Date: 04/07/2020  Blood pressure demonstrated a normal response to exercise.  Upsloping ST segment depression ST segment depression was noted during stress in the V4, V5, II and III leads.  ETT with fair exercise tolerance (6:02); no chest pain; normal blood pressure response; equivocal ST changes in inferolateral leads (approximately 1 mm of ST depression in the inferolateral leads most of which is upsloping but borderline); if clinical suspicion high would perform additional testing such as nuclear imaging or cardiac CTA.     ASSESSMENT/PLAN:  This is a very pleasant 74 year old Caucasian male with a very light smoking history. He was diagnosed with stage IV (T3, N0, M1C) non-small cell lung cancer, squamous cell carcinoma based on the biopsy of the left axillary mass. He was diagnosed in August 2019.   He is status post 4 cycles of induction systemic chemotherapy with carboplatin, paclitaxel, and Keytruda with a partial response. Status post 34 cycles. Starting from cycle #5, he is currently undergoing maintenance immunotherapy with Keytruda 200 mg IV every 3 weeks. He developed a skin rash after cycle #7 which resolved with a Medrol Dosepak and Benadryl.Otherwise, he is tolerating treatment well without any adverse effects.  The patient was seen with Dr. Julien Nordmann today. Labs were reviewed. Recommend  that he proceed with cycle #35 today as scheduled.   We will see him back for follow-up visit in 3 weeks for evaluation results before starting cycle#36with single agent Keytruda.  The patient was advised to call immediately if he has any concerning symptoms in the interval. The patient voices understanding of current disease status and treatment options and is in agreement with the current care plan. All questions were answered. The patient knows to call the clinic with any problems, questions or concerns. We can certainly see the patient much sooner if necessary  No orders of the defined types were placed in this encounter.    Shamira Toutant L Naavya Postma, PA-C 04/12/20  ADDENDUM: Hematology/Oncology Attending: I had a face-to-face encounter with the patient today.  I  recommended his care plan.  This is a very pleasant 74 years old white male with stage IV non-small cell lung cancer, squamous cell carcinoma diagnosed in August 2019.  The patient started systemic chemotherapy with induction carboplatin, paclitaxel and Keytruda for 4 cycles followed by maintenance treatment with single agent Keytruda starting from cycle #5.  He is status post 34 cycles of treatment.  He has been tolerating this treatment well with no concerning adverse effects. I recommended for him to proceed with cycle #35 today as planned. The patient will come back for follow-up visit in 3 weeks for evaluation before the next cycle of his treatment. He was advised to call immediately if he has any concerning symptoms in the interval.  Disclaimer: This note was dictated with voice recognition software. Similar sounding words can inadvertently be transcribed and may be missed upon review. Eilleen Kempf, MD 04/12/20

## 2020-04-11 MED ORDER — PRALUENT 150 MG/ML ~~LOC~~ SOAJ: 150.0000 mg | SUBCUTANEOUS | 11 refills | Status: DC

## 2020-04-11 NOTE — Addendum Note (Signed)
Addended by: Allean Found on: 04/11/2020 10:01 AM   Modules accepted: Orders

## 2020-04-11 NOTE — Telephone Encounter (Signed)
Called and lmom for the pt to start the praluent 150mg , rx sent, pt instructed to call back if unaffordable

## 2020-04-12 ENCOUNTER — Encounter: Payer: Self-pay | Admitting: Pharmacist

## 2020-04-12 ENCOUNTER — Inpatient Hospital Stay: Payer: Medicare Other

## 2020-04-12 ENCOUNTER — Other Ambulatory Visit: Payer: Self-pay

## 2020-04-12 ENCOUNTER — Inpatient Hospital Stay (HOSPITAL_BASED_OUTPATIENT_CLINIC_OR_DEPARTMENT_OTHER): Payer: Medicare Other | Admitting: Physician Assistant

## 2020-04-12 ENCOUNTER — Encounter: Payer: Self-pay | Admitting: Physician Assistant

## 2020-04-12 ENCOUNTER — Inpatient Hospital Stay: Payer: Medicare Other | Attending: Oncology

## 2020-04-12 VITALS — BP 113/74 | HR 69 | Temp 97.7°F | Resp 18 | Ht 74.0 in | Wt 234.0 lb

## 2020-04-12 DIAGNOSIS — Z5112 Encounter for antineoplastic immunotherapy: Secondary | ICD-10-CM

## 2020-04-12 DIAGNOSIS — Z95828 Presence of other vascular implants and grafts: Secondary | ICD-10-CM

## 2020-04-12 DIAGNOSIS — I2510 Atherosclerotic heart disease of native coronary artery without angina pectoris: Secondary | ICD-10-CM | POA: Diagnosis not present

## 2020-04-12 DIAGNOSIS — C3491 Malignant neoplasm of unspecified part of right bronchus or lung: Secondary | ICD-10-CM

## 2020-04-12 DIAGNOSIS — C7801 Secondary malignant neoplasm of right lung: Secondary | ICD-10-CM

## 2020-04-12 DIAGNOSIS — I10 Essential (primary) hypertension: Secondary | ICD-10-CM | POA: Insufficient documentation

## 2020-04-12 DIAGNOSIS — R5383 Other fatigue: Secondary | ICD-10-CM | POA: Diagnosis not present

## 2020-04-12 DIAGNOSIS — M549 Dorsalgia, unspecified: Secondary | ICD-10-CM | POA: Insufficient documentation

## 2020-04-12 DIAGNOSIS — C773 Secondary and unspecified malignant neoplasm of axilla and upper limb lymph nodes: Secondary | ICD-10-CM | POA: Diagnosis not present

## 2020-04-12 DIAGNOSIS — Z85828 Personal history of other malignant neoplasm of skin: Secondary | ICD-10-CM | POA: Diagnosis not present

## 2020-04-12 DIAGNOSIS — E785 Hyperlipidemia, unspecified: Secondary | ICD-10-CM | POA: Diagnosis not present

## 2020-04-12 DIAGNOSIS — E039 Hypothyroidism, unspecified: Secondary | ICD-10-CM

## 2020-04-12 DIAGNOSIS — G8929 Other chronic pain: Secondary | ICD-10-CM | POA: Insufficient documentation

## 2020-04-12 DIAGNOSIS — K5900 Constipation, unspecified: Secondary | ICD-10-CM | POA: Diagnosis not present

## 2020-04-12 DIAGNOSIS — C7802 Secondary malignant neoplasm of left lung: Secondary | ICD-10-CM | POA: Diagnosis not present

## 2020-04-12 DIAGNOSIS — R197 Diarrhea, unspecified: Secondary | ICD-10-CM | POA: Diagnosis not present

## 2020-04-12 DIAGNOSIS — Z79899 Other long term (current) drug therapy: Secondary | ICD-10-CM | POA: Insufficient documentation

## 2020-04-12 DIAGNOSIS — Z9221 Personal history of antineoplastic chemotherapy: Secondary | ICD-10-CM | POA: Insufficient documentation

## 2020-04-12 LAB — CMP (CANCER CENTER ONLY)
ALT: 10 U/L (ref 0–44)
AST: 15 U/L (ref 15–41)
Albumin: 3.8 g/dL (ref 3.5–5.0)
Alkaline Phosphatase: 54 U/L (ref 38–126)
Anion gap: 7 (ref 5–15)
BUN: 13 mg/dL (ref 8–23)
CO2: 25 mmol/L (ref 22–32)
Calcium: 9.5 mg/dL (ref 8.9–10.3)
Chloride: 108 mmol/L (ref 98–111)
Creatinine: 0.92 mg/dL (ref 0.61–1.24)
GFR, Est AFR Am: >60 mL/min (ref >60)
GFR, Estimated: >60 mL/min (ref >60)
Glucose, Bld: 97 mg/dL (ref 70–99)
Potassium: 3.8 mmol/L (ref 3.5–5.1)
Sodium: 140 mmol/L (ref 135–145)
Total Bilirubin: 0.9 mg/dL (ref 0.3–1.2)
Total Protein: 7.3 g/dL (ref 6.5–8.1)

## 2020-04-12 LAB — CBC WITH DIFFERENTIAL (CANCER CENTER ONLY)
Abs Immature Granulocytes: 0.02 10*3/uL (ref 0.00–0.07)
Basophils Absolute: 0 10*3/uL (ref 0.0–0.1)
Basophils Relative: 1 %
Eosinophils Absolute: 0.3 10*3/uL (ref 0.0–0.5)
Eosinophils Relative: 5 %
HCT: 37.2 % — ABNORMAL LOW (ref 39.0–52.0)
Hemoglobin: 12.7 g/dL — ABNORMAL LOW (ref 13.0–17.0)
Immature Granulocytes: 0 %
Lymphocytes Relative: 17 %
Lymphs Abs: 0.9 10*3/uL (ref 0.7–4.0)
MCH: 31.6 pg (ref 26.0–34.0)
MCHC: 34.1 g/dL (ref 30.0–36.0)
MCV: 92.5 fL (ref 80.0–100.0)
Monocytes Absolute: 0.7 10*3/uL (ref 0.1–1.0)
Monocytes Relative: 14 %
Neutro Abs: 3.3 10*3/uL (ref 1.7–7.7)
Neutrophils Relative %: 63 %
Platelet Count: 154 10*3/uL (ref 150–400)
RBC: 4.02 MIL/uL — ABNORMAL LOW (ref 4.22–5.81)
RDW: 12.1 % (ref 11.5–15.5)
WBC Count: 5.2 10*3/uL (ref 4.0–10.5)
nRBC: 0 % (ref 0.0–0.2)

## 2020-04-12 LAB — TSH: TSH: 4.001 u[IU]/mL (ref 0.320–4.118)

## 2020-04-12 MED ORDER — SODIUM CHLORIDE 0.9 % IV SOLN
200.0000 mg | Freq: Once | INTRAVENOUS | Status: AC
  Administered 2020-04-12: 200 mg via INTRAVENOUS
  Filled 2020-04-12: qty 8

## 2020-04-12 MED ORDER — SODIUM CHLORIDE 0.9% FLUSH
10.0000 mL | Freq: Once | INTRAVENOUS | Status: AC
  Administered 2020-04-12: 10 mL
  Filled 2020-04-12: qty 10

## 2020-04-12 MED ORDER — SODIUM CHLORIDE 0.9 % IV SOLN
Freq: Once | INTRAVENOUS | Status: AC
  Filled 2020-04-12: qty 250

## 2020-04-12 MED ORDER — SODIUM CHLORIDE 0.9% FLUSH
10.0000 mL | INTRAVENOUS | Status: DC | PRN
  Administered 2020-04-12: 10 mL
  Filled 2020-04-12: qty 10

## 2020-04-12 MED ORDER — HEPARIN SOD (PORK) LOCK FLUSH 100 UNIT/ML IV SOLN
500.0000 [IU] | Freq: Once | INTRAVENOUS | Status: AC | PRN
  Administered 2020-04-12: 500 [IU]
  Filled 2020-04-12: qty 5

## 2020-04-12 NOTE — Assessment & Plan Note (Signed)
LDL above goal, while on higher tolerated statin dose, for secondary prevention. Also noted family history of early onset ASCVD with MI in father at age 73.  Noted neuropathies with pravastatin once chemotherapy started.  We discussed PCSK9i  MOA, storage, administration, common side effects, monitoring, prior-authorization process, and available patient assistance. Will start PA for Repatha/Praluent every 14 days and follow up as needed.  Plan to repeat fasting lipid panel and LFTs aroung the 5th injection of new medication.

## 2020-04-12 NOTE — Patient Instructions (Signed)
[facility] Discharge Instructions for Patients Receiving Chemotherapy  Today you received the following chemotherapy agents Pembrolizumab [facility]).  To help prevent nausea and vomiting after your treatment, we encourage you to take your nausea medication as prescribed.   If you develop nausea and vomiting that is not controlled by your nausea medication, call the clinic.   BELOW ARE SYMPTOMS THAT SHOULD BE REPORTED IMMEDIATELY:  *FEVER GREATER THAN 100.5 F  *CHILLS WITH OR WITHOUT FEVER  NAUSEA AND VOMITING THAT IS NOT CONTROLLED WITH YOUR NAUSEA MEDICATION  *UNUSUAL SHORTNESS OF BREATH  *UNUSUAL BRUISING OR BLEEDING  TENDERNESS IN MOUTH AND THROAT WITH OR WITHOUT PRESENCE OF ULCERS  *URINARY PROBLEMS  *BOWEL PROBLEMS  UNUSUAL RASH Items with * indicate a potential emergency and should be followed up as soon as possible.  Feel free to call the clinic should you have any questions or concerns. The clinic phone number is [phone].  Please show the [facility] at check-in to the Emergency Department and triage nurse.

## 2020-04-14 DIAGNOSIS — G620 Drug-induced polyneuropathy: Secondary | ICD-10-CM | POA: Insufficient documentation

## 2020-04-14 DIAGNOSIS — E7801 Familial hypercholesterolemia: Secondary | ICD-10-CM | POA: Insufficient documentation

## 2020-04-16 ENCOUNTER — Other Ambulatory Visit: Payer: Self-pay | Admitting: Internal Medicine

## 2020-04-16 DIAGNOSIS — C3491 Malignant neoplasm of unspecified part of right bronchus or lung: Secondary | ICD-10-CM

## 2020-04-23 DIAGNOSIS — M7731 Calcaneal spur, right foot: Secondary | ICD-10-CM | POA: Diagnosis not present

## 2020-04-23 DIAGNOSIS — M79671 Pain in right foot: Secondary | ICD-10-CM | POA: Diagnosis not present

## 2020-04-23 DIAGNOSIS — M19071 Primary osteoarthritis, right ankle and foot: Secondary | ICD-10-CM | POA: Diagnosis not present

## 2020-04-25 DIAGNOSIS — M79671 Pain in right foot: Secondary | ICD-10-CM | POA: Diagnosis not present

## 2020-04-25 DIAGNOSIS — M25571 Pain in right ankle and joints of right foot: Secondary | ICD-10-CM | POA: Diagnosis not present

## 2020-05-02 DIAGNOSIS — M79671 Pain in right foot: Secondary | ICD-10-CM | POA: Diagnosis not present

## 2020-05-03 ENCOUNTER — Inpatient Hospital Stay: Payer: Medicare Other

## 2020-05-03 ENCOUNTER — Encounter: Payer: Self-pay | Admitting: Internal Medicine

## 2020-05-03 ENCOUNTER — Inpatient Hospital Stay (HOSPITAL_BASED_OUTPATIENT_CLINIC_OR_DEPARTMENT_OTHER): Payer: Medicare Other | Admitting: Internal Medicine

## 2020-05-03 ENCOUNTER — Other Ambulatory Visit: Payer: Self-pay

## 2020-05-03 VITALS — BP 143/83 | HR 68 | Temp 97.8°F | Resp 20 | Ht 74.0 in | Wt 239.8 lb

## 2020-05-03 DIAGNOSIS — C3491 Malignant neoplasm of unspecified part of right bronchus or lung: Secondary | ICD-10-CM

## 2020-05-03 DIAGNOSIS — E039 Hypothyroidism, unspecified: Secondary | ICD-10-CM

## 2020-05-03 DIAGNOSIS — Z5112 Encounter for antineoplastic immunotherapy: Secondary | ICD-10-CM | POA: Diagnosis not present

## 2020-05-03 DIAGNOSIS — I10 Essential (primary) hypertension: Secondary | ICD-10-CM

## 2020-05-03 DIAGNOSIS — C773 Secondary and unspecified malignant neoplasm of axilla and upper limb lymph nodes: Secondary | ICD-10-CM | POA: Diagnosis not present

## 2020-05-03 DIAGNOSIS — Z95828 Presence of other vascular implants and grafts: Secondary | ICD-10-CM

## 2020-05-03 DIAGNOSIS — C7801 Secondary malignant neoplasm of right lung: Secondary | ICD-10-CM

## 2020-05-03 DIAGNOSIS — C3490 Malignant neoplasm of unspecified part of unspecified bronchus or lung: Secondary | ICD-10-CM

## 2020-05-03 DIAGNOSIS — R5383 Other fatigue: Secondary | ICD-10-CM | POA: Diagnosis not present

## 2020-05-03 DIAGNOSIS — Z9221 Personal history of antineoplastic chemotherapy: Secondary | ICD-10-CM | POA: Diagnosis not present

## 2020-05-03 DIAGNOSIS — C7802 Secondary malignant neoplasm of left lung: Secondary | ICD-10-CM | POA: Diagnosis not present

## 2020-05-03 LAB — CMP (CANCER CENTER ONLY)
ALT: 11 U/L (ref 0–44)
AST: 17 U/L (ref 15–41)
Albumin: 3.6 g/dL (ref 3.5–5.0)
Alkaline Phosphatase: 61 U/L (ref 38–126)
Anion gap: 8 (ref 5–15)
BUN: 13 mg/dL (ref 8–23)
CO2: 25 mmol/L (ref 22–32)
Calcium: 9.3 mg/dL (ref 8.9–10.3)
Chloride: 110 mmol/L (ref 98–111)
Creatinine: 0.87 mg/dL (ref 0.61–1.24)
GFR, Est AFR Am: >60 mL/min (ref >60)
GFR, Estimated: >60 mL/min (ref >60)
Glucose, Bld: 96 mg/dL (ref 70–99)
Potassium: 4.1 mmol/L (ref 3.5–5.1)
Sodium: 143 mmol/L (ref 135–145)
Total Bilirubin: 0.4 mg/dL (ref 0.3–1.2)
Total Protein: 6.8 g/dL (ref 6.5–8.1)

## 2020-05-03 LAB — CBC WITH DIFFERENTIAL (CANCER CENTER ONLY)
Abs Immature Granulocytes: 0.02 10*3/uL (ref 0.00–0.07)
Basophils Absolute: 0 10*3/uL (ref 0.0–0.1)
Basophils Relative: 1 %
Eosinophils Absolute: 0.3 10*3/uL (ref 0.0–0.5)
Eosinophils Relative: 6 %
HCT: 35.1 % — ABNORMAL LOW (ref 39.0–52.0)
Hemoglobin: 12.1 g/dL — ABNORMAL LOW (ref 13.0–17.0)
Immature Granulocytes: 0 %
Lymphocytes Relative: 20 %
Lymphs Abs: 1 10*3/uL (ref 0.7–4.0)
MCH: 31.6 pg (ref 26.0–34.0)
MCHC: 34.5 g/dL (ref 30.0–36.0)
MCV: 91.6 fL (ref 80.0–100.0)
Monocytes Absolute: 0.6 10*3/uL (ref 0.1–1.0)
Monocytes Relative: 12 %
Neutro Abs: 3.1 10*3/uL (ref 1.7–7.7)
Neutrophils Relative %: 61 %
Platelet Count: 143 10*3/uL — ABNORMAL LOW (ref 150–400)
RBC: 3.83 MIL/uL — ABNORMAL LOW (ref 4.22–5.81)
RDW: 12 % (ref 11.5–15.5)
WBC Count: 5.2 10*3/uL (ref 4.0–10.5)
nRBC: 0 % (ref 0.0–0.2)

## 2020-05-03 LAB — TSH: TSH: 2.996 u[IU]/mL (ref 0.320–4.118)

## 2020-05-03 MED ORDER — SODIUM CHLORIDE 0.9 % IV SOLN
Freq: Once | INTRAVENOUS | Status: AC
  Filled 2020-05-03: qty 250

## 2020-05-03 MED ORDER — SODIUM CHLORIDE 0.9 % IV SOLN
200.0000 mg | Freq: Once | INTRAVENOUS | Status: AC
  Administered 2020-05-03: 200 mg via INTRAVENOUS
  Filled 2020-05-03: qty 8

## 2020-05-03 MED ORDER — SODIUM CHLORIDE 0.9% FLUSH
10.0000 mL | INTRAVENOUS | Status: DC | PRN
  Administered 2020-05-03: 10 mL
  Filled 2020-05-03: qty 10

## 2020-05-03 MED ORDER — SODIUM CHLORIDE 0.9% FLUSH
10.0000 mL | Freq: Once | INTRAVENOUS | Status: AC
  Administered 2020-05-03: 10 mL
  Filled 2020-05-03: qty 10

## 2020-05-03 MED ORDER — HEPARIN SOD (PORK) LOCK FLUSH 100 UNIT/ML IV SOLN
500.0000 [IU] | Freq: Once | INTRAVENOUS | Status: AC | PRN
  Administered 2020-05-03: 500 [IU]
  Filled 2020-05-03: qty 5

## 2020-05-03 NOTE — Progress Notes (Signed)
[facility] [phone]   [phone]  OFFICE PROGRESS NOTE  Travis Norlander, DO [place] [address]  DIAGNOSIS: Stage IV (T3, N0, M1c) non-small cell lung cancer, squamous cell carcinoma presented with large right infrahilar mass in addition to left upper lobe lung nodule as well as left axillary mass with left axillary lymph node diagnosed in August 2019.  PRIOR THERAPY:  1) Palliative radiotherapy to the right infrahilar mass as well as the axillary mass under the care of Dr. Lisbeth Renshaw. 2) Systemic chemotherapy with carboplatin for AUC of 5, paclitaxel 175 mg/M2 and Keytruda 200 mg IV every 3 weeks status post 4 cycles.  CURRENT THERAPY: Maintenance immunotherapy with single agent Keytruda 200 mg IV every 3 weeks status post 31 cycles.  INTERVAL HISTORY: Travis Padilla 74 y.o. male returns to the clinic today for follow-up visit.  The patient is feeling fine today with no concerning complaints.  He denied having any chest pain, shortness of breath, cough or hemoptysis.  He denied having any fever or chills.  He has no nausea, vomiting, diarrhea or constipation.  He has no rash or itching.  He denied having any recent weight loss or night sweats.  He continues to tolerate his treatment with Keytruda fairly well.  The patient is here today for evaluation before starting cycle #32 of his maintenance treatment with Keytruda.  MEDICAL HISTORY: Past Medical History:  Diagnosis Date  . Abnormal nuclear stress test    December, 2013  . Anemia   . Axillary adenopathy 04/24/2018  . CAD (coronary artery disease)    Mild nonobstructive plaque in cath 2013  . Chest pain    December, 2013  . Cough with hemoptysis 04/22/2018  . Dizziness   . Dyslipidemia 07/14/2019  . Encounter for antineoplastic immunotherapy 05/13/2018  . Gout   . Hemorrhoid   . Hyperlipidemia   . Hypertension   . Hypothyroidism (acquired) 08/11/2018  . Metastasis to lung  ([place]) 05/19/2018  . 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) ([place]) dx'd 04/17/18   to LN, infrahilar mass, lung nodule and lt axilla  . Obesity, unspecified 03/28/2009   Qualifier: Diagnosis of  By: [name], MD, [name]    . Palpitations 03/28/2009   Qualifier: Diagnosis of  By: [name], MD, [name]    . Right lower lobe lung mass 04/22/2018  . Skin cancer   . SNORING 03/28/2009   Qualifier: Diagnosis of  By: [name], MD, [name]    . Spinal stenosis   . 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[place]) 05/13/2018    ALLERGIES:  is allergic to pravastatin.  MEDICATIONS:  Current Outpatient Medications  Medication Sig Dispense Refill  . Alirocumab (PRALUENT) 150 MG/ML SOAJ Inject 150 mg into the skin every 14 (fourteen) days. 2 pen 11  . Apple Cider Vinegar 500 MG TABS Take by mouth.    . Artificial Tear Solution (SOOTHE XP OP) Place 1 drop into both eyes 2 (two) times daily.    . carvedilol (COREG) 12.5 MG tablet Take 1 tablet (12.5 mg total) by mouth in the morning, at noon, and at bedtime. 270 tablet 3  . Cyanocobalamin (VITAMIN B 12) 250 MCG LOZG Take by mouth.    . diphenhydramine-acetaminophen (TYLENOL PM) 25-500 MG TABS tablet Take 1-2 tablets by mouth at bedtime as needed (pain).    . ferrous sulfate 324 MG TBEC Take 324 mg by  mouth daily with breakfast.    . levothyroxine (SYNTHROID) 150 MCG tablet TAKE 1 TABLET BY MOUTH EVERY DAY BEFORE BREAKFAST 30 tablet 0  . lidocaine-prilocaine (EMLA) cream Apply 1 application topically as needed. 30 g 1  . methocarbamol (ROBAXIN) 500 MG tablet Take 500 mg by mouth daily.    . Multiple Vitamins-Minerals (PRESERVISION AREDS 2 PO) Take by mouth.    . naproxen (NAPROSYN) 375 MG tablet Take 375 mg by mouth 2 (two) times daily.    [place] Kitchen oxyCODONE-acetaminophen (PERCOCET/ROXICET) 5-325 MG tablet Take 1 tablet by mouth every 8 (eight) hours as needed for severe pain. 30 tablet 0  . senna-docusate (SENNA S) 8.6-50 MG  tablet 1 to 2 twice daily for constipation 120 tablet 5  . traZODone (DESYREL) 50 MG tablet TAKE 1-2 TABLETS BY MOUTH AT BEDTIME AS NEEDED FOR SLEEP. 180 tablet 0   No current facility-administered medications for this visit.    SURGICAL HISTORY:  Past Surgical History:  Procedure Laterality Date  . 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 BELPHAROPTOSIS REPAIR Bilateral   . CATARACT EXTRACTION W/ INTRAOCULAR LENS  IMPLANT, BILATERAL    . COLONOSCOPY N/A 11/03/2014   Procedure: COLONOSCOPY;  Surgeon: Rogene Houston, MD;  [facility];  Service: Endoscopy;  Laterality: N/A;  1225  . IR IMAGING GUIDED PORT INSERTION  07/11/2018  . KNEE CARTILAGE SURGERY Left    Left knee  . SKIN CANCER EXCISION  12/2014, 04/25/15  . VIDEO BRONCHOSCOPY Bilateral 05/09/2018   Procedure: VIDEO BRONCHOSCOPY WITH FLUORO;  Surgeon: Juanito Doom, MD;  Location[facility];  Service: Cardiopulmonary;  Laterality: Bilateral;    REVIEW OF SYSTEMS:  A comprehensive review of systems was negative.   PHYSICAL EXAMINATION: General appearance: alert, cooperative and no distress Head: Normocephalic, without obvious abnormality, atraumatic Neck: no adenopathy, no JVD, supple, symmetrical, trachea midline and thyroid not enlarged, symmetric, no tenderness/mass/nodules Lymph nodes: Cervical, supraclavicular, and axillary nodes normal. Resp: clear to auscultation bilaterally Back: symmetric, no curvature. ROM normal. No CVA tenderness. Cardio: regular rate and rhythm, S1, S2 normal, no murmur, click, rub or gallop GI: soft, non-tender; bowel sounds normal; no masses,  no organomegaly Extremities: extremities normal, atraumatic, no cyanosis or edema  ECOG PERFORMANCE STATUS: 1 - Symptomatic but completely ambulatory  Blood pressure (!) 143/83, pulse 68, temperature 97.8 F (36.6 C), temperature source Tympanic, resp. rate 20, height 6\' 2"  (1.88 m), weight 239 lb 12.8 oz (108.8 kg), SpO2 100 %.  LABORATORY  DATA: Lab Results  Component Value Date   WBC 5.2 05/03/2020   HGB 12.1 (L) 05/03/2020   HCT 35.1 (L) 05/03/2020   MCV 91.6 05/03/2020   PLT 143 (L) 05/03/2020      Chemistry      Component Value Date/Time   NA 140 04/12/2020 0758   NA 144 04/09/2018 1507   K 3.8 04/12/2020 0758   CL 108 04/12/2020 0758   CO2 25 04/12/2020 0758   BUN 13 04/12/2020 0758   BUN 15 04/09/2018 1507   CREATININE 0.92 04/12/2020 0758      Component Value Date/Time   CALCIUM 9.5 04/12/2020 0758   ALKPHOS 54 04/12/2020 0758   AST 15 04/12/2020 0758   ALT 10 04/12/2020 0758   BILITOT 0.9 04/12/2020 0758       RADIOGRAPHIC STUDIES: Exercise Tolerance Test  Result Date: 04/07/2020  Blood pressure demonstrated a normal response to exercise.  Upsloping ST segment depression ST segment depression was  noted during stress in the V4, V5, II and III leads.  ETT with fair exercise tolerance (6:02); no chest pain; normal blood pressure response; equivocal ST changes in inferolateral leads (approximately 1 mm of ST depression in the inferolateral leads most of which is upsloping but borderline); if clinical suspicion high would perform additional testing such as nuclear imaging or cardiac CTA.    ASSESSMENT AND PLAN: This is a very pleasant 74 years old white male with very light most smoking history recently diagnosed with stage IV (T3, N0, M1c) non-small cell lung cancer, squamous cell carcinoma based on the biopsy from the left axillary mass. He status post 4 cycles of induction systemic chemotherapy with carboplatin, paclitaxel and Keytruda with partial response.  The patient is currently on maintenance treatment with single agent Keytruda status post 31 cycles. The patient continues to tolerate his treatment well with no concerning adverse effects. I recommended for him to proceed with cycle #32 today as planned. I will see him back for follow-up visit in 3 weeks for evaluation after repeating CT scan of  the chest, abdomen pelvis for restaging of his disease. For the hypothyroidism, he will continue his current treatment with levothyroxine and will monitor his TSH closely. The patient was advised to call immediately if he has any concerning symptoms in the interval. The patient voices understanding of current disease status and treatment options and is in agreement with the current care plan. All questions were answered. The patient knows to call the clinic with any problems, questions or concerns. We can certainly see the patient much sooner if necessary.  Disclaimer: This note was dictated with voice recognition software. Similar sounding words can inadvertently be transcribed and may not be corrected upon review.

## 2020-05-03 NOTE — Patient Instructions (Signed)
[facility] Discharge Instructions for Patients Receiving Chemotherapy  Today you received the following chemotherapy agents:  Keytruda.  To help prevent nausea and vomiting after your treatment, we encourage you to take your nausea medication as directed.   If you develop nausea and vomiting that is not controlled by your nausea medication, call the clinic.   BELOW ARE SYMPTOMS THAT SHOULD BE REPORTED IMMEDIATELY:  *FEVER GREATER THAN 100.5 F  *CHILLS WITH OR WITHOUT FEVER  NAUSEA AND VOMITING THAT IS NOT CONTROLLED WITH YOUR NAUSEA MEDICATION  *UNUSUAL SHORTNESS OF BREATH  *UNUSUAL BRUISING OR BLEEDING  TENDERNESS IN MOUTH AND THROAT WITH OR WITHOUT PRESENCE OF ULCERS  *URINARY PROBLEMS  *BOWEL PROBLEMS  UNUSUAL RASH Items with * indicate a potential emergency and should be followed up as soon as possible.  Feel free to call the clinic should you have any questions or concerns. The clinic phone number is [phone].  Please show the CHEMO ALERT CARD at check-in to the Emergency Department and triage nurse.    

## 2020-05-13 DIAGNOSIS — J019 Acute sinusitis, unspecified: Secondary | ICD-10-CM | POA: Diagnosis not present

## 2020-05-13 DIAGNOSIS — Z0389 Encounter for observation for other suspected diseases and conditions ruled out: Secondary | ICD-10-CM | POA: Diagnosis not present

## 2020-05-17 ENCOUNTER — Encounter: Payer: Medicare Other | Admitting: Family Medicine

## 2020-05-17 ENCOUNTER — Encounter: Payer: Self-pay | Admitting: Family Medicine

## 2020-05-17 ENCOUNTER — Telehealth: Payer: Self-pay | Admitting: Medical Oncology

## 2020-05-17 ENCOUNTER — Other Ambulatory Visit: Payer: Self-pay | Admitting: Internal Medicine

## 2020-05-17 DIAGNOSIS — R05 Cough: Secondary | ICD-10-CM | POA: Diagnosis not present

## 2020-05-17 DIAGNOSIS — R0981 Nasal congestion: Secondary | ICD-10-CM | POA: Diagnosis not present

## 2020-05-17 DIAGNOSIS — C3491 Malignant neoplasm of unspecified part of right bronchus or lung: Secondary | ICD-10-CM

## 2020-05-17 NOTE — Progress Notes (Signed)
Pt. Already has "another path." Did not agree to televisit.

## 2020-05-17 NOTE — Telephone Encounter (Signed)
Upper resp infection -Pt was started on amoxicillin while in [place]. He was seen in [facility] today at Urgent Care and prescribed  Doxycycline and prednisone dose pack 10 mg BID x 5 days. Next keytruda 9/14. Okay for these new drugs? Per Dr Julien Nordmann I told [place] these drugs are okay to take. I told pt to call back if he is not better in the next two.  His " COVID test today was negative " .

## 2020-05-23 ENCOUNTER — Other Ambulatory Visit: Payer: Self-pay

## 2020-05-23 ENCOUNTER — Encounter (HOSPITAL_COMMUNITY): Payer: Self-pay

## 2020-05-23 ENCOUNTER — Ambulatory Visit (HOSPITAL_COMMUNITY)
Admission: RE | Admit: 2020-05-23 | Discharge: 2020-05-23 | Disposition: A | Discharge status: Home / Self Care | Payer: Medicare Other | Source: Ambulatory Visit | Attending: Internal Medicine | Admitting: Internal Medicine

## 2020-05-23 DIAGNOSIS — N200 Calculus of kidney: Secondary | ICD-10-CM | POA: Diagnosis not present

## 2020-05-23 DIAGNOSIS — C3490 Malignant neoplasm of unspecified part of unspecified bronchus or lung: Secondary | ICD-10-CM | POA: Diagnosis not present

## 2020-05-23 DIAGNOSIS — K7689 Other specified diseases of liver: Secondary | ICD-10-CM | POA: Diagnosis not present

## 2020-05-23 MED ORDER — HEPARIN SOD (PORK) LOCK FLUSH 100 UNIT/ML IV SOLN: 500.0000 [IU] | Freq: Once | INTRAVENOUS | Status: DC

## 2020-05-23 MED ORDER — IOHEXOL 300 MG/ML  SOLN
100.0000 mL | Freq: Once | INTRAMUSCULAR | Status: AC | PRN
  Administered 2020-05-23: 100 mL via INTRAVENOUS

## 2020-05-23 MED ORDER — HEPARIN SOD (PORK) LOCK FLUSH 100 UNIT/ML IV SOLN
INTRAVENOUS | Status: AC
  Administered 2020-05-23: 500 [IU] via INTRAVENOUS
  Filled 2020-05-23: qty 5

## 2020-05-24 ENCOUNTER — Inpatient Hospital Stay (HOSPITAL_BASED_OUTPATIENT_CLINIC_OR_DEPARTMENT_OTHER): Payer: Medicare Other | Admitting: Internal Medicine

## 2020-05-24 ENCOUNTER — Inpatient Hospital Stay: Payer: Medicare Other

## 2020-05-24 ENCOUNTER — Encounter: Payer: Self-pay | Admitting: Internal Medicine

## 2020-05-24 ENCOUNTER — Inpatient Hospital Stay: Payer: Medicare Other | Attending: Oncology

## 2020-05-24 ENCOUNTER — Other Ambulatory Visit: Payer: Self-pay

## 2020-05-24 VITALS — BP 125/77 | HR 73 | Temp 97.1°F | Resp 18 | Ht 74.0 in | Wt 238.1 lb

## 2020-05-24 DIAGNOSIS — R5383 Other fatigue: Secondary | ICD-10-CM | POA: Diagnosis not present

## 2020-05-24 DIAGNOSIS — Z95828 Presence of other vascular implants and grafts: Secondary | ICD-10-CM

## 2020-05-24 DIAGNOSIS — E039 Hypothyroidism, unspecified: Secondary | ICD-10-CM

## 2020-05-24 DIAGNOSIS — Z79899 Other long term (current) drug therapy: Secondary | ICD-10-CM | POA: Diagnosis not present

## 2020-05-24 DIAGNOSIS — Z791 Long term (current) use of non-steroidal anti-inflammatories (NSAID): Secondary | ICD-10-CM | POA: Diagnosis not present

## 2020-05-24 DIAGNOSIS — R197 Diarrhea, unspecified: Secondary | ICD-10-CM | POA: Diagnosis not present

## 2020-05-24 DIAGNOSIS — C3491 Malignant neoplasm of unspecified part of right bronchus or lung: Secondary | ICD-10-CM

## 2020-05-24 DIAGNOSIS — Z9221 Personal history of antineoplastic chemotherapy: Secondary | ICD-10-CM | POA: Diagnosis not present

## 2020-05-24 DIAGNOSIS — Z85828 Personal history of other malignant neoplasm of skin: Secondary | ICD-10-CM | POA: Insufficient documentation

## 2020-05-24 DIAGNOSIS — I2510 Atherosclerotic heart disease of native coronary artery without angina pectoris: Secondary | ICD-10-CM | POA: Insufficient documentation

## 2020-05-24 DIAGNOSIS — I10 Essential (primary) hypertension: Secondary | ICD-10-CM | POA: Insufficient documentation

## 2020-05-24 DIAGNOSIS — C7802 Secondary malignant neoplasm of left lung: Secondary | ICD-10-CM | POA: Insufficient documentation

## 2020-05-24 DIAGNOSIS — E785 Hyperlipidemia, unspecified: Secondary | ICD-10-CM | POA: Insufficient documentation

## 2020-05-24 DIAGNOSIS — Z5112 Encounter for antineoplastic immunotherapy: Secondary | ICD-10-CM | POA: Insufficient documentation

## 2020-05-24 LAB — CBC WITH DIFFERENTIAL (CANCER CENTER ONLY)
Abs Immature Granulocytes: 0.08 10*3/uL — ABNORMAL HIGH (ref 0.00–0.07)
Basophils Absolute: 0 10*3/uL (ref 0.0–0.1)
Basophils Relative: 1 %
Eosinophils Absolute: 0.3 10*3/uL (ref 0.0–0.5)
Eosinophils Relative: 4 %
HCT: 35.9 % — ABNORMAL LOW (ref 39.0–52.0)
Hemoglobin: 12.4 g/dL — ABNORMAL LOW (ref 13.0–17.0)
Immature Granulocytes: 1 %
Lymphocytes Relative: 17 %
Lymphs Abs: 1.2 10*3/uL (ref 0.7–4.0)
MCH: 31.8 pg (ref 26.0–34.0)
MCHC: 34.5 g/dL (ref 30.0–36.0)
MCV: 92.1 fL (ref 80.0–100.0)
Monocytes Absolute: 0.7 10*3/uL (ref 0.1–1.0)
Monocytes Relative: 10 %
Neutro Abs: 4.7 10*3/uL (ref 1.7–7.7)
Neutrophils Relative %: 67 %
Platelet Count: 173 10*3/uL (ref 150–400)
RBC: 3.9 MIL/uL — ABNORMAL LOW (ref 4.22–5.81)
RDW: 12.1 % (ref 11.5–15.5)
WBC Count: 7 10*3/uL (ref 4.0–10.5)
nRBC: 0 % (ref 0.0–0.2)

## 2020-05-24 LAB — CMP (CANCER CENTER ONLY)
ALT: 9 U/L (ref 0–44)
AST: 12 U/L — ABNORMAL LOW (ref 15–41)
Albumin: 3.5 g/dL (ref 3.5–5.0)
Alkaline Phosphatase: 66 U/L (ref 38–126)
Anion gap: 8 (ref 5–15)
BUN: 17 mg/dL (ref 8–23)
CO2: 23 mmol/L (ref 22–32)
Calcium: 8.9 mg/dL (ref 8.9–10.3)
Chloride: 109 mmol/L (ref 98–111)
Creatinine: 0.89 mg/dL (ref 0.61–1.24)
GFR, Est AFR Am: >60 mL/min (ref >60)
GFR, Estimated: >60 mL/min (ref >60)
Glucose, Bld: 94 mg/dL (ref 70–99)
Potassium: 3.9 mmol/L (ref 3.5–5.1)
Sodium: 140 mmol/L (ref 135–145)
Total Bilirubin: 0.5 mg/dL (ref 0.3–1.2)
Total Protein: 6.9 g/dL (ref 6.5–8.1)

## 2020-05-24 LAB — TSH: TSH: 6.137 u[IU]/mL — ABNORMAL HIGH (ref 0.320–4.118)

## 2020-05-24 MED ORDER — SODIUM CHLORIDE 0.9% FLUSH
10.0000 mL | INTRAVENOUS | Status: DC | PRN
  Administered 2020-05-24: 10 mL
  Filled 2020-05-24: qty 10

## 2020-05-24 MED ORDER — SODIUM CHLORIDE 0.9 % IV SOLN
200.0000 mg | Freq: Once | INTRAVENOUS | Status: AC
  Administered 2020-05-24: 200 mg via INTRAVENOUS
  Filled 2020-05-24: qty 8

## 2020-05-24 MED ORDER — HEPARIN SOD (PORK) LOCK FLUSH 100 UNIT/ML IV SOLN
500.0000 [IU] | Freq: Once | INTRAVENOUS | Status: AC | PRN
  Administered 2020-05-24: 500 [IU]
  Filled 2020-05-24: qty 5

## 2020-05-24 MED ORDER — SODIUM CHLORIDE 0.9 % IV SOLN
Freq: Once | INTRAVENOUS | Status: AC
  Filled 2020-05-24: qty 250

## 2020-05-24 NOTE — Progress Notes (Signed)
[facility] [phone]   [phone]  OFFICE PROGRESS NOTE  Travis Norlander, DO [place] [address]  DIAGNOSIS: Stage IV (T3, N0, M1c) non-small cell lung cancer, squamous cell carcinoma presented with large right infrahilar mass in addition to left upper lobe lung nodule as well as left axillary mass with left axillary lymph node diagnosed in August 2019.  PRIOR THERAPY:  1) Palliative radiotherapy to the right infrahilar mass as well as the axillary mass under the care of Dr. Lisbeth Renshaw. 2) Systemic chemotherapy with carboplatin for AUC of 5, paclitaxel 175 mg/M2 and Keytruda 200 mg IV every 3 weeks status post 4 cycles.  CURRENT THERAPY: Maintenance immunotherapy with single agent Keytruda 200 mg IV every 3 weeks status post 32 cycles.  INTERVAL HISTORY: Travis Padilla 74 y.o. male returns to the clinic today for follow-up visit.  The patient is feeling fine today with no concerning complaints he was diagnosed with bronchitis while in [place].  He was seen at one of the urgent care center and started on treatment with doxycycline and prednisone.  The patient denied having any current chest pain, shortness of breath, cough or hemoptysis.  He denied having any nausea, vomiting but had few episodes of diarrhea with no constipation.  He has no headache or visual changes.  He has no recent weight loss or night sweats.  The patient had repeat CT scan of the chest, abdomen and pelvis performed recently and he is here for evaluation and discussion of his discuss results.  MEDICAL HISTORY: Past Medical History:  Diagnosis Date  . Abnormal nuclear stress test    December, 2013  . Anemia   . Axillary adenopathy 04/24/2018  . CAD (coronary artery disease)    Mild nonobstructive plaque in cath 2013  . Chest pain    December, 2013  . Cough with hemoptysis 04/22/2018  . Dizziness   . Dyslipidemia 07/14/2019  . Encounter for antineoplastic  immunotherapy 05/13/2018  . Gout   . Hemorrhoid   . Hyperlipidemia   . Hypertension   . Hypothyroidism (acquired) 08/11/2018  . 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 Obesity, unspecified 03/28/2009   Qualifier: Diagnosis of  By: [name], MD, [name]    . Palpitations 03/28/2009   Qualifier: Diagnosis of  By: [name], MD, [name]    . Right lower lobe lung mass 04/22/2018  . Skin cancer   . SNORING 03/28/2009   Qualifier: Diagnosis of  By: [name], MD, [name]    . Spinal stenosis   . 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[place]) 05/13/2018    ALLERGIES:  is allergic to pravastatin.  MEDICATIONS:  Current Outpatient Medications  Medication Sig Dispense Refill  . Alirocumab (PRALUENT) 150 MG/ML SOAJ Inject 150 mg into the skin every 14 (fourteen) days. 2 pen 11  . Apple Cider Vinegar 500 MG TABS Take by mouth.    . Artificial Tear Solution (SOOTHE XP OP) Place 1 drop into both eyes 2 (two) times daily.    . carvedilol (COREG) 12.5 MG tablet Take 1 tablet (12.5 mg total) by mouth in the morning, at noon, and at bedtime. 270 tablet 3  . Cyanocobalamin (VITAMIN B 12) 250 MCG LOZG Take by mouth.    . diphenhydramine-acetaminophen (TYLENOL PM) 25-500  MG TABS tablet Take 1-2 tablets by mouth at bedtime as needed (pain).    . ferrous sulfate 324 MG TBEC Take 324 mg by mouth daily with breakfast.    . levothyroxine (SYNTHROID) 150 MCG tablet TAKE 1 TABLET BY MOUTH EVERY DAY BEFORE BREAKFAST 30 tablet 0  . lidocaine-prilocaine (EMLA) cream Apply 1 application topically as needed. 30 g 1  . methocarbamol (ROBAXIN) 500 MG tablet Take 500 mg by mouth daily.    . Multiple Vitamins-Minerals (PRESERVISION AREDS 2 PO) Take by mouth.    . naproxen (NAPROSYN) 375 MG tablet Take 375 mg by mouth 2 (two) times daily.    Marland Kitchen oxyCODONE-acetaminophen  (PERCOCET/ROXICET) 5-325 MG tablet Take 1 tablet by mouth every 8 (eight) hours as needed for severe pain. 30 tablet 0  . senna-docusate (SENNA S) 8.6-50 MG tablet 1 to 2 twice daily for constipation 120 tablet 5  . traZODone (DESYREL) 50 MG tablet TAKE 1-2 TABLETS BY MOUTH AT BEDTIME AS NEEDED FOR SLEEP. 180 tablet 0   No current facility-administered medications for this visit.    SURGICAL HISTORY:  Past Surgical History:  Procedure Laterality Date  . 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 BELPHAROPTOSIS REPAIR Bilateral   . CATARACT EXTRACTION W/ INTRAOCULAR LENS  IMPLANT, BILATERAL    . COLONOSCOPY N/A 11/03/2014   Procedure: COLONOSCOPY;  Surgeon: Rogene Houston, MD;  Location: [facility];  Service: Endoscopy;  Laterality: N/A;  1225  . IR IMAGING GUIDED PORT INSERTION  07/11/2018  . KNEE CARTILAGE SURGERY Left    Left knee  . SKIN CANCER EXCISION  12/2014, 04/25/15  . VIDEO BRONCHOSCOPY Bilateral 05/09/2018   Procedure: VIDEO BRONCHOSCOPY WITH FLUORO;  Surgeon: Juanito Doom, MD;  Location: [facility];  Service: Cardiopulmonary;  Laterality: Bilateral;    REVIEW OF SYSTEMS:  Constitutional: positive for fatigue Eyes: negative Ears, nose, mouth, throat, and face: negative Respiratory: negative Cardiovascular: negative Gastrointestinal: positive for diarrhea Genitourinary:negative Integument/breast: negative Hematologic/lymphatic: negative Musculoskeletal:negative Neurological: negative Behavioral/Psych: negative Endocrine: negative Allergic/Immunologic: negative   PHYSICAL EXAMINATION: General appearance: alert, cooperative, fatigued and no distress Head: Normocephalic, without obvious abnormality, atraumatic Neck: no adenopathy, no JVD, supple, symmetrical, trachea midline and thyroid not enlarged, symmetric, no tenderness/mass/nodules Lymph nodes: Cervical, supraclavicular, and axillary nodes normal. Resp: clear to auscultation bilaterally Back: symmetric, no  curvature. ROM normal. No CVA tenderness. Cardio: regular rate and rhythm, S1, S2 normal, no murmur, click, rub or gallop GI: soft, non-tender; bowel sounds normal; no masses,  no organomegaly Extremities: extremities normal, atraumatic, no cyanosis or edema Neurologic: Alert and oriented X 3, normal strength and tone. Normal symmetric reflexes. Normal coordination and gait  ECOG PERFORMANCE STATUS: 1 - Symptomatic but completely ambulatory  Blood pressure 125/77, pulse 73, temperature (!) 97.1 F (36.2 C), temperature source Tympanic, resp. rate 18, height 6\' 2"  (1.88 m), weight 238 lb 1.6 oz (108 kg), SpO2 99 %.  LABORATORY DATA: Lab Results  Component Value Date   WBC 7.0 05/24/2020   HGB 12.4 (L) 05/24/2020   HCT 35.9 (L) 05/24/2020   MCV 92.1 05/24/2020   PLT 173 05/24/2020      Chemistry      Component Value Date/Time   NA 143 05/03/2020 0825   NA 144 04/09/2018 1507   K 4.1 05/03/2020 0825   CL 110 05/03/2020 0825   CO2 25 05/03/2020 0825   BUN 13 05/03/2020 0825   BUN 15 04/09/2018 1507   CREATININE 0.87 05/03/2020 0825  Component Value Date/Time   CALCIUM 9.3 05/03/2020 0825   ALKPHOS 61 05/03/2020 0825   AST 17 05/03/2020 0825   ALT 11 05/03/2020 0825   BILITOT 0.4 05/03/2020 0825       RADIOGRAPHIC STUDIES: CT Chest W Contrast  Result Date: 05/23/2020 CLINICAL DATA:  Non-small cell lung cancer, chemotherapy and XRT complete, Keytruda in progress EXAM: CT CHEST, ABDOMEN, AND PELVIS WITH CONTRAST TECHNIQUE: Multidetector CT imaging of the chest, abdomen and pelvis was performed following the standard protocol during bolus administration of intravenous contrast. CONTRAST:  182mL OMNIPAQUE IOHEXOL 300 MG/ML  SOLN COMPARISON:  None. FINDINGS: CT CHEST FINDINGS Cardiovascular: The heart is normal in size. No pericardial effusion. No evidence of thoracic aortic aneurysm. Mild atherosclerotic calcifications of the aortic arch and descending thoracic aorta.  Coronary atherosclerosis of the LAD and right coronary artery. Right chest port terminates at the cavoatrial junction. Mediastinum/Nodes: No suspicious mediastinal, hilar, or right axillary lymphadenopathy. 12 mm short axis left axillary node (series 2/image 8), unchanged. Visualized thyroid is unremarkable. Lungs/Pleura: Radiation changes in the right perihilar region and medial right lower lobe. Associated volume loss. 2.9 x 2.7 x 1.5 cm anterior left upper lobe nodule (series 6/image 39), previously 2.8 x 2.6 x 1.5 cm when remeasured in a similar fashion, favored to be minimally progressive. Mild biapical pleural-parenchymal scarring. No focal consolidation. No pleural effusion or pneumothorax. Musculoskeletal: Mild degenerative changes of the thoracic spine. CT ABDOMEN PELVIS FINDINGS Hepatobiliary: 15 mm central left hepatic lobe cyst (series 2/image 84). Liver is otherwise within normal limits. No suspicious/enhancing hepatic lesions. Gallbladder is unremarkable. No intrahepatic or extrahepatic ductal dilatation. Pancreas: Within normal limits. Spleen: Within normal limits. Adrenals/Urinary Tract: Adrenal glands are within normal limits. Nonobstructing bilateral renal calculi measuring to 3 mm. No hydronephrosis. Bladder is within normal limits. Stomach/Bowel: Stomach is within normal limits. No evidence of bowel obstruction. Normal appendix (series 2/image 97). Sigmoid diverticulosis, without evidence of diverticulitis. Vascular/Lymphatic: No evidence of abdominal aortic aneurysm. Atherosclerotic calcifications of the abdominal aorta and branch vessels. No suspicious abdominopelvic lymphadenopathy. Reproductive: Mild prostatomegaly. Other: No abdominopelvic ascites. Musculoskeletal: Mild degenerative changes of the lumbar spine, most prominent at L5-S1. IMPRESSION: 2.9 cm anterior left upper lobe nodule, favored to be minimally progressive. Continued attention on follow-up is suggested. Stable 12 mm short  axis left axillary node, unchanged. Radiation changes in the right lung. No evidence of metastatic disease in the abdomen/pelvis. Electronically Signed   By: Julian Hy M.D.   On: 05/23/2020 09:31   CT Abdomen Pelvis W Contrast  Result Date: 05/23/2020 CLINICAL DATA:  Non-small cell lung cancer, chemotherapy and XRT complete, Keytruda in progress EXAM: CT CHEST, ABDOMEN, AND PELVIS WITH CONTRAST TECHNIQUE: Multidetector CT imaging of the chest, abdomen and pelvis was performed following the standard protocol during bolus administration of intravenous contrast. CONTRAST:  117mL OMNIPAQUE IOHEXOL 300 MG/ML  SOLN COMPARISON:  None. FINDINGS: CT CHEST FINDINGS Cardiovascular: The heart is normal in size. No pericardial effusion. No evidence of thoracic aortic aneurysm. Mild atherosclerotic calcifications of the aortic arch and descending thoracic aorta. Coronary atherosclerosis of the LAD and right coronary artery. Right chest port terminates at the cavoatrial junction. Mediastinum/Nodes: No suspicious mediastinal, hilar, or right axillary lymphadenopathy. 12 mm short axis left axillary node (series 2/image 8), unchanged. Visualized thyroid is unremarkable. Lungs/Pleura: Radiation changes in the right perihilar region and medial right lower lobe. Associated volume loss. 2.9 x 2.7 x 1.5 cm anterior left upper lobe nodule (series 6/image 39), previously 2.8  x 2.6 x 1.5 cm when remeasured in a similar fashion, favored to be minimally progressive. Mild biapical pleural-parenchymal scarring. No focal consolidation. No pleural effusion or pneumothorax. Musculoskeletal: Mild degenerative changes of the thoracic spine. CT ABDOMEN PELVIS FINDINGS Hepatobiliary: 15 mm central left hepatic lobe cyst (series 2/image 84). Liver is otherwise within normal limits. No suspicious/enhancing hepatic lesions. Gallbladder is unremarkable. No intrahepatic or extrahepatic ductal dilatation. Pancreas: Within normal limits. Spleen:  Within normal limits. Adrenals/Urinary Tract: Adrenal glands are within normal limits. Nonobstructing bilateral renal calculi measuring to 3 mm. No hydronephrosis. Bladder is within normal limits. Stomach/Bowel: Stomach is within normal limits. No evidence of bowel obstruction. Normal appendix (series 2/image 97). Sigmoid diverticulosis, without evidence of diverticulitis. Vascular/Lymphatic: No evidence of abdominal aortic aneurysm. Atherosclerotic calcifications of the abdominal aorta and branch vessels. No suspicious abdominopelvic lymphadenopathy. Reproductive: Mild prostatomegaly. Other: No abdominopelvic ascites. Musculoskeletal: Mild degenerative changes of the lumbar spine, most prominent at L5-S1. IMPRESSION: 2.9 cm anterior left upper lobe nodule, favored to be minimally progressive. Continued attention on follow-up is suggested. Stable 12 mm short axis left axillary node, unchanged. Radiation changes in the right lung. No evidence of metastatic disease in the abdomen/pelvis. Electronically Signed   By: Julian Hy M.D.   On: 05/23/2020 09:31    ASSESSMENT AND PLAN: This is a very pleasant 74 years old white male with very light most smoking history recently diagnosed with stage IV (T3, N0, M1c) non-small cell lung cancer, squamous cell carcinoma based on the biopsy from the left axillary mass. He status post 4 cycles of induction systemic chemotherapy with carboplatin, paclitaxel and Keytruda with partial response.  The patient is currently on maintenance treatment with single agent Keytruda status post 32 cycles. The patient has been tolerating this treatment fairly well with no concerning adverse effects. He had repeat CT scan of the chest, abdomen pelvis performed recently.  I personally and independently reviewed the scans and discussed the results with the patient today. His scan showed no concerning findings for disease progression. I recommended for the patient to continue his current  treatment with [facility] and he will proceed with cycle #33 today. For the hypothyroidism, he will continue with the current dose of levothyroxine and will continue to monitor closely. The patient come back for follow-up visit in 3 weeks for evaluation before the next cycle of his treatment. He was advised to call immediately if he has any concerning symptoms in the interval. The patient voices understanding of current disease status and treatment options and is in agreement with the current care plan. All questions were answered. The patient knows to call the clinic with any problems, questions or concerns. We can certainly see the patient much sooner if necessary.  Disclaimer: This note was dictated with voice recognition software. Similar sounding words can inadvertently be transcribed and may not be corrected upon review.

## 2020-05-24 NOTE — Patient Instructions (Signed)

## 2020-05-24 NOTE — Patient Instructions (Signed)
[facility] Discharge Instructions for Patients Receiving Chemotherapy  Today you received the following chemotherapy agents: pembrolizumab.  To help prevent nausea and vomiting after your treatment, we encourage you to take your nausea medication as directed.   If you develop nausea and vomiting that is not controlled by your nausea medication, call the clinic.   BELOW ARE SYMPTOMS THAT SHOULD BE REPORTED IMMEDIATELY:  *FEVER GREATER THAN 100.5 F  *CHILLS WITH OR WITHOUT FEVER  NAUSEA AND VOMITING THAT IS NOT CONTROLLED WITH YOUR NAUSEA MEDICATION  *UNUSUAL SHORTNESS OF BREATH  *UNUSUAL BRUISING OR BLEEDING  TENDERNESS IN MOUTH AND THROAT WITH OR WITHOUT PRESENCE OF ULCERS  *URINARY PROBLEMS  *BOWEL PROBLEMS  UNUSUAL RASH Items with * indicate a potential emergency and should be followed up as soon as possible.  Feel free to call the clinic should you have any questions or concerns. The clinic phone number is [phone].  Please show the CHEMO ALERT CARD at check-in to the Emergency Department and triage nurse.   

## 2020-05-25 ENCOUNTER — Telehealth: Payer: Self-pay | Admitting: Internal Medicine

## 2020-05-25 NOTE — Telephone Encounter (Signed)
Scheduled per los. Called and left msg. Mailed printout  °

## 2020-06-06 ENCOUNTER — Telehealth: Payer: Self-pay

## 2020-06-06 DIAGNOSIS — Z79899 Other long term (current) drug therapy: Secondary | ICD-10-CM

## 2020-06-06 DIAGNOSIS — E7801 Familial hypercholesterolemia: Secondary | ICD-10-CM

## 2020-06-06 NOTE — Telephone Encounter (Signed)
4 weeks ago - severe sinus infection, still have runny nose, cough, diarrhea, and drainage. Unknown if Praluent is causing the problems. Also noted pain on his feet.    Next dose due October 4th - will skip [id number]   October 18th will re-challenge with Repatha 140mg  and call back to report reaction.

## 2020-06-06 NOTE — Telephone Encounter (Signed)
Pt called in stating that they believe the praluent may be causing flu like symptoms and balance issues. Will route to [facility] for further evaluation.

## 2020-06-08 ENCOUNTER — Other Ambulatory Visit: Payer: Self-pay

## 2020-06-08 ENCOUNTER — Other Ambulatory Visit: Payer: Self-pay | Admitting: Internal Medicine

## 2020-06-08 ENCOUNTER — Other Ambulatory Visit: Payer: Medicare Other

## 2020-06-08 DIAGNOSIS — E7801 Familial hypercholesterolemia: Secondary | ICD-10-CM | POA: Diagnosis not present

## 2020-06-08 DIAGNOSIS — Z79899 Other long term (current) drug therapy: Secondary | ICD-10-CM | POA: Diagnosis not present

## 2020-06-08 DIAGNOSIS — C3491 Malignant neoplasm of unspecified part of right bronchus or lung: Secondary | ICD-10-CM

## 2020-06-09 LAB — LIPID PANEL WITH LDL/HDL RATIO
Cholesterol, Total: 113 mg/dL (ref 100–199)
HDL: 38 mg/dL — ABNORMAL LOW (ref >39)
LDL Chol Calc (NIH): 50 mg/dL (ref 0–99)
LDL/HDL Ratio: 1.3 ratio (ref 0.0–3.6)
Triglycerides: 146 mg/dL (ref 0–149)
VLDL Cholesterol Cal: 25 mg/dL (ref 5–40)

## 2020-06-13 ENCOUNTER — Inpatient Hospital Stay: Payer: Medicare Other

## 2020-06-13 ENCOUNTER — Other Ambulatory Visit: Payer: Self-pay

## 2020-06-13 ENCOUNTER — Inpatient Hospital Stay: Payer: Medicare Other | Attending: Oncology | Admitting: Internal Medicine

## 2020-06-13 ENCOUNTER — Encounter: Payer: Self-pay | Admitting: Internal Medicine

## 2020-06-13 VITALS — BP 139/85 | HR 62 | Temp 97.1°F | Resp 18 | Ht 74.0 in | Wt 241.8 lb

## 2020-06-13 DIAGNOSIS — Z5112 Encounter for antineoplastic immunotherapy: Secondary | ICD-10-CM

## 2020-06-13 DIAGNOSIS — I2510 Atherosclerotic heart disease of native coronary artery without angina pectoris: Secondary | ICD-10-CM | POA: Insufficient documentation

## 2020-06-13 DIAGNOSIS — R0989 Other specified symptoms and signs involving the circulatory and respiratory systems: Secondary | ICD-10-CM | POA: Insufficient documentation

## 2020-06-13 DIAGNOSIS — Z95828 Presence of other vascular implants and grafts: Secondary | ICD-10-CM

## 2020-06-13 DIAGNOSIS — Z9221 Personal history of antineoplastic chemotherapy: Secondary | ICD-10-CM | POA: Diagnosis not present

## 2020-06-13 DIAGNOSIS — Z791 Long term (current) use of non-steroidal anti-inflammatories (NSAID): Secondary | ICD-10-CM | POA: Diagnosis not present

## 2020-06-13 DIAGNOSIS — C3491 Malignant neoplasm of unspecified part of right bronchus or lung: Secondary | ICD-10-CM

## 2020-06-13 DIAGNOSIS — E039 Hypothyroidism, unspecified: Secondary | ICD-10-CM | POA: Diagnosis not present

## 2020-06-13 DIAGNOSIS — Z85828 Personal history of other malignant neoplasm of skin: Secondary | ICD-10-CM | POA: Diagnosis not present

## 2020-06-13 DIAGNOSIS — Z23 Encounter for immunization: Secondary | ICD-10-CM

## 2020-06-13 DIAGNOSIS — Z79899 Other long term (current) drug therapy: Secondary | ICD-10-CM | POA: Insufficient documentation

## 2020-06-13 DIAGNOSIS — Z87891 Personal history of nicotine dependence: Secondary | ICD-10-CM | POA: Insufficient documentation

## 2020-06-13 DIAGNOSIS — C773 Secondary and unspecified malignant neoplasm of axilla and upper limb lymph nodes: Secondary | ICD-10-CM | POA: Insufficient documentation

## 2020-06-13 DIAGNOSIS — Z923 Personal history of irradiation: Secondary | ICD-10-CM | POA: Diagnosis not present

## 2020-06-13 DIAGNOSIS — E785 Hyperlipidemia, unspecified: Secondary | ICD-10-CM | POA: Diagnosis not present

## 2020-06-13 DIAGNOSIS — C3431 Malignant neoplasm of lower lobe, right bronchus or lung: Secondary | ICD-10-CM | POA: Insufficient documentation

## 2020-06-13 DIAGNOSIS — I10 Essential (primary) hypertension: Secondary | ICD-10-CM | POA: Diagnosis not present

## 2020-06-13 DIAGNOSIS — C7801 Secondary malignant neoplasm of right lung: Secondary | ICD-10-CM

## 2020-06-13 LAB — CBC WITH DIFFERENTIAL (CANCER CENTER ONLY)
Abs Immature Granulocytes: 0.03 10*3/uL (ref 0.00–0.07)
Basophils Absolute: 0 10*3/uL (ref 0.0–0.1)
Basophils Relative: 1 %
Eosinophils Absolute: 0.5 10*3/uL (ref 0.0–0.5)
Eosinophils Relative: 10 %
HCT: 37.3 % — ABNORMAL LOW (ref 39.0–52.0)
Hemoglobin: 12.6 g/dL — ABNORMAL LOW (ref 13.0–17.0)
Immature Granulocytes: 1 %
Lymphocytes Relative: 20 %
Lymphs Abs: 1 10*3/uL (ref 0.7–4.0)
MCH: 31.4 pg (ref 26.0–34.0)
MCHC: 33.8 g/dL (ref 30.0–36.0)
MCV: 93 fL (ref 80.0–100.0)
Monocytes Absolute: 0.8 10*3/uL (ref 0.1–1.0)
Monocytes Relative: 17 %
Neutro Abs: 2.6 10*3/uL (ref 1.7–7.7)
Neutrophils Relative %: 51 %
Platelet Count: 151 10*3/uL (ref 150–400)
RBC: 4.01 MIL/uL — ABNORMAL LOW (ref 4.22–5.81)
RDW: 12.7 % (ref 11.5–15.5)
WBC Count: 5 10*3/uL (ref 4.0–10.5)
nRBC: 0 % (ref 0.0–0.2)

## 2020-06-13 LAB — CMP (CANCER CENTER ONLY)
ALT: 15 U/L (ref 0–44)
AST: 17 U/L (ref 15–41)
Albumin: 3.6 g/dL (ref 3.5–5.0)
Alkaline Phosphatase: 64 U/L (ref 38–126)
Anion gap: 4 — ABNORMAL LOW (ref 5–15)
BUN: 13 mg/dL (ref 8–23)
CO2: 26 mmol/L (ref 22–32)
Calcium: 8.8 mg/dL — ABNORMAL LOW (ref 8.9–10.3)
Chloride: 110 mmol/L (ref 98–111)
Creatinine: 0.96 mg/dL (ref 0.61–1.24)
GFR, Est AFR Am: >60 mL/min (ref >60)
GFR, Estimated: >60 mL/min (ref >60)
Glucose, Bld: 101 mg/dL — ABNORMAL HIGH (ref 70–99)
Potassium: 4.5 mmol/L (ref 3.5–5.1)
Sodium: 140 mmol/L (ref 135–145)
Total Bilirubin: 0.5 mg/dL (ref 0.3–1.2)
Total Protein: 7 g/dL (ref 6.5–8.1)

## 2020-06-13 LAB — TSH: TSH: 4.159 u[IU]/mL — ABNORMAL HIGH (ref 0.320–4.118)

## 2020-06-13 MED ORDER — ALTEPLASE 2 MG IJ SOLR
2.0000 mg | Freq: Once | INTRAMUSCULAR | Status: AC
  Administered 2020-06-13: 2 mg
  Filled 2020-06-13: qty 2

## 2020-06-13 MED ORDER — SODIUM CHLORIDE 0.9% FLUSH
10.0000 mL | Freq: Once | INTRAVENOUS | Status: AC
  Administered 2020-06-13: 10 mL
  Filled 2020-06-13: qty 10

## 2020-06-13 MED ORDER — SODIUM CHLORIDE 0.9% FLUSH
10.0000 mL | INTRAVENOUS | Status: DC | PRN
  Administered 2020-06-13: 10 mL
  Filled 2020-06-13: qty 10

## 2020-06-13 MED ORDER — HEPARIN SOD (PORK) LOCK FLUSH 100 UNIT/ML IV SOLN
500.0000 [IU] | Freq: Once | INTRAVENOUS | Status: AC | PRN
  Administered 2020-06-13: 500 [IU]
  Filled 2020-06-13: qty 5

## 2020-06-13 MED ORDER — ALTEPLASE 2 MG IJ SOLR
INTRAMUSCULAR | Status: AC
  Filled 2020-06-13: qty 2

## 2020-06-13 MED ORDER — SODIUM CHLORIDE 0.9 % IV SOLN
200.0000 mg | Freq: Once | INTRAVENOUS | Status: AC
  Administered 2020-06-13: 200 mg via INTRAVENOUS
  Filled 2020-06-13: qty 8

## 2020-06-13 MED ORDER — SODIUM CHLORIDE 0.9 % IV SOLN
Freq: Once | INTRAVENOUS | Status: AC
  Filled 2020-06-13: qty 250

## 2020-06-13 NOTE — Patient Instructions (Signed)

## 2020-06-13 NOTE — Patient Instructions (Signed)
[facility] Discharge Instructions for Patients Receiving Chemotherapy  Today you received the following chemotherapy agents: pembrolizumab.  To help prevent nausea and vomiting after your treatment, we encourage you to take your nausea medication as directed.   If you develop nausea and vomiting that is not controlled by your nausea medication, call the clinic.   BELOW ARE SYMPTOMS THAT SHOULD BE REPORTED IMMEDIATELY:  *FEVER GREATER THAN 100.5 F  *CHILLS WITH OR WITHOUT FEVER  NAUSEA AND VOMITING THAT IS NOT CONTROLLED WITH YOUR NAUSEA MEDICATION  *UNUSUAL SHORTNESS OF BREATH  *UNUSUAL BRUISING OR BLEEDING  TENDERNESS IN MOUTH AND THROAT WITH OR WITHOUT PRESENCE OF ULCERS  *URINARY PROBLEMS  *BOWEL PROBLEMS  UNUSUAL RASH Items with * indicate a potential emergency and should be followed up as soon as possible.  Feel free to call the clinic should you have any questions or concerns. The clinic phone number is [phone].  Please show the CHEMO ALERT CARD at check-in to the Emergency Department and triage nurse.   

## 2020-06-13 NOTE — Patient Instructions (Signed)
   Covid-19 Vaccination Clinic  Name:  Travis Padilla    MRN: [id number] DOB: 08-05-1946  06/13/2020  Travis Padilla was observed post Covid-19 immunization for 41min without incident. He was provided with Vaccine Information Sheet and instruction to access the V-Safe system.   Travis Padilla was instructed to call 911 with any severe reactions post vaccine: Marland Kitchen Difficulty breathing  . Swelling of face and throat  . A fast heartbeat  . A bad rash all over body  . Dizziness and weakness

## 2020-06-13 NOTE — Progress Notes (Signed)
[name] [phone]   [phone]  OFFICE PROGRESS NOTE  [name], DO [place] [address]  DIAGNOSIS: Stage IV (T3, N0, M1c) non-small cell lung cancer, squamous cell carcinoma presented with large right infrahilar mass in addition to left upper lobe lung nodule as well as left axillary mass with left axillary lymph node diagnosed in August 2019.  PRIOR THERAPY:  1) Palliative radiotherapy to the right infrahilar mass as well as the axillary mass under the care of Dr. Lisbeth Renshaw. 2) Systemic chemotherapy with carboplatin for AUC of 5, paclitaxel 175 mg/M2 and Keytruda 200 mg IV every 3 weeks status post 4 cycles.  CURRENT THERAPY: Maintenance immunotherapy with single agent Keytruda 200 mg IV every 3 weeks status post 33 cycles.  INTERVAL HISTORY: Travis Padilla 74 y.o. male returns to the clinic today for follow-up visit.  The patient is feeling fine today with no concerning complaints except for persistent cough secondary to tickling in his throat.  It is usually worse at nighttime.  He denied having any chest pain, shortness of breath or hemoptysis.  He denied having any recent weight loss or night sweats.  He has no nausea, vomiting, diarrhea or constipation.  He has no headache or visual changes.  He is here today for evaluation before starting cycle #38 of his treatment.   MEDICAL HISTORY: Past Medical History:  Diagnosis Date  . Abnormal nuclear stress test    December, 2013  . Anemia   . Axillary adenopathy 04/24/2018  . CAD (coronary artery disease)    Mild nonobstructive plaque in cath 2013  . Chest pain    December, 2013  . Cough with hemoptysis 04/22/2018  . Dizziness   . Dyslipidemia 07/14/2019  . Encounter for antineoplastic immunotherapy 05/13/2018  . Gout   . Hemorrhoid   . Hyperlipidemia   . Hypertension   . Hypothyroidism (acquired) 08/11/2018  . 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 Obesity, unspecified 03/28/2009   Qualifier: Diagnosis of  By: [name], MD, [name]    . Palpitations 03/28/2009   Qualifier: Diagnosis of  By: [name], MD, [name]    . Right lower lobe lung mass 04/22/2018  . Skin cancer   . SNORING 03/28/2009   Qualifier: Diagnosis of  By: [name], MD, [name]    . Spinal stenosis   . 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[facility]) 05/13/2018    ALLERGIES:  is allergic to pravastatin.  MEDICATIONS:  Current Outpatient Medications  Medication Sig Dispense Refill  . Alirocumab (PRALUENT) 150 MG/ML SOAJ Inject 150 mg into the skin every 14 (fourteen) days. 2 pen 11  . Apple Cider Vinegar 500 MG TABS Take by mouth.    . Artificial Tear Solution (SOOTHE XP OP) Place 1 drop into both eyes 2 (two) times daily.    . carvedilol (COREG) 12.5 MG tablet Take 1 tablet (12.5 mg total) by mouth in the morning, at noon, and at bedtime. 270 tablet 3  . Cyanocobalamin (VITAMIN B 12) 250 MCG LOZG Take by mouth.    . diphenhydramine-acetaminophen (TYLENOL PM) 25-500 MG TABS tablet Take 1-2 tablets by mouth at bedtime as needed (pain).    . ferrous sulfate 324 MG TBEC Take 324 mg by mouth daily with breakfast.    .  levothyroxine (SYNTHROID) 150 MCG tablet TAKE 1 TABLET BY MOUTH EVERY DAY BEFORE BREAKFAST 30 tablet 0  . lidocaine-prilocaine (EMLA) cream Apply 1 application topically as needed. 30 g 1  . methocarbamol (ROBAXIN) 500 MG tablet Take 500 mg by mouth daily.    . Multiple Vitamins-Minerals (PRESERVISION AREDS 2 PO) Take by mouth.    . naproxen (NAPROSYN) 375 MG tablet Take 375 mg by mouth 2 (two) times daily.    Marland Kitchen oxyCODONE-acetaminophen (PERCOCET/ROXICET) 5-325 MG tablet Take 1 tablet by mouth every 8 (eight) hours as needed for severe pain. 30 tablet 0  . senna-docusate (SENNA S) 8.6-50 MG tablet 1 to 2 twice daily for  constipation 120 tablet 5  . traZODone (DESYREL) 50 MG tablet TAKE 1-2 TABLETS BY MOUTH AT BEDTIME AS NEEDED FOR SLEEP. 180 tablet 0   No current facility-administered medications for this visit.    SURGICAL HISTORY:  Past Surgical History:  Procedure Laterality Date  . 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 BELPHAROPTOSIS REPAIR Bilateral   . CATARACT EXTRACTION W/ INTRAOCULAR LENS  IMPLANT, BILATERAL    . COLONOSCOPY N/A 11/03/2014   Procedure: COLONOSCOPY;  Surgeon: Rogene Houston, MD;  Location: [facility];  Service: Endoscopy;  Laterality: N/A;  1225  . IR IMAGING GUIDED PORT INSERTION  07/11/2018  . KNEE CARTILAGE SURGERY Left    Left knee  . SKIN CANCER EXCISION  12/2014, 04/25/15  . VIDEO BRONCHOSCOPY Bilateral 05/09/2018   Procedure: VIDEO BRONCHOSCOPY WITH FLUORO;  Surgeon: Juanito Doom, MD;  Location: [facility];  Service: Cardiopulmonary;  Laterality: Bilateral;    REVIEW OF SYSTEMS:  A comprehensive review of systems was negative except for: Respiratory: positive for cough   PHYSICAL EXAMINATION: General appearance: alert, cooperative and no distress Head: Normocephalic, without obvious abnormality, atraumatic Neck: no adenopathy, no JVD, supple, symmetrical, trachea midline and thyroid not enlarged, symmetric, no tenderness/mass/nodules Lymph nodes: Cervical, supraclavicular, and axillary nodes normal. Resp: clear to auscultation bilaterally Back: symmetric, no curvature. ROM normal. No CVA tenderness. Cardio: regular rate and rhythm, S1, S2 normal, no murmur, click, rub or gallop GI: soft, non-tender; bowel sounds normal; no masses,  no organomegaly Extremities: extremities normal, atraumatic, no cyanosis or edema  ECOG PERFORMANCE STATUS: 1 - Symptomatic but completely ambulatory  Blood pressure 139/85, pulse 62, temperature (!) 97.1 F (36.2 C), temperature source Tympanic, resp. rate 18, height 6\' 2"  (1.88 m), weight 241 lb 12.8 oz (109.7 kg), SpO2 100  %.  LABORATORY DATA: Lab Results  Component Value Date   WBC 5.0 06/13/2020   HGB 12.6 (L) 06/13/2020   HCT 37.3 (L) 06/13/2020   MCV 93.0 06/13/2020   PLT 151 06/13/2020      Chemistry      Component Value Date/Time   NA 140 05/24/2020 0837   NA 144 04/09/2018 1507   K 3.9 05/24/2020 0837   CL 109 05/24/2020 0837   CO2 23 05/24/2020 0837   BUN 17 05/24/2020 0837   BUN 15 04/09/2018 1507   CREATININE 0.89 05/24/2020 0837      Component Value Date/Time   CALCIUM 8.9 05/24/2020 0837   ALKPHOS 66 05/24/2020 0837   AST 12 (L) 05/24/2020 0837   ALT 9 05/24/2020 0837   BILITOT 0.5 05/24/2020 0837       RADIOGRAPHIC STUDIES: CT Chest W Contrast  Result Date: 05/23/2020 CLINICAL DATA:  Non-small cell lung cancer, chemotherapy and XRT complete, Keytruda in progress EXAM: CT CHEST, ABDOMEN, AND  PELVIS WITH CONTRAST TECHNIQUE: Multidetector CT imaging of the chest, abdomen and pelvis was performed following the standard protocol during bolus administration of intravenous contrast. CONTRAST:  157mL OMNIPAQUE IOHEXOL 300 MG/ML  SOLN COMPARISON:  None. FINDINGS: CT CHEST FINDINGS Cardiovascular: The heart is normal in size. No pericardial effusion. No evidence of thoracic aortic aneurysm. Mild atherosclerotic calcifications of the aortic arch and descending thoracic aorta. Coronary atherosclerosis of the LAD and right coronary artery. Right chest port terminates at the cavoatrial junction. Mediastinum/Nodes: No suspicious mediastinal, hilar, or right axillary lymphadenopathy. 12 mm short axis left axillary node (series 2/image 8), unchanged. Visualized thyroid is unremarkable. Lungs/Pleura: Radiation changes in the right perihilar region and medial right lower lobe. Associated volume loss. 2.9 x 2.7 x 1.5 cm anterior left upper lobe nodule (series 6/image 39), previously 2.8 x 2.6 x 1.5 cm when remeasured in a similar fashion, favored to be minimally progressive. Mild biapical  pleural-parenchymal scarring. No focal consolidation. No pleural effusion or pneumothorax. Musculoskeletal: Mild degenerative changes of the thoracic spine. CT ABDOMEN PELVIS FINDINGS Hepatobiliary: 15 mm central left hepatic lobe cyst (series 2/image 84). Liver is otherwise within normal limits. No suspicious/enhancing hepatic lesions. Gallbladder is unremarkable. No intrahepatic or extrahepatic ductal dilatation. Pancreas: Within normal limits. Spleen: Within normal limits. Adrenals/Urinary Tract: Adrenal glands are within normal limits. Nonobstructing bilateral renal calculi measuring to 3 mm. No hydronephrosis. Bladder is within normal limits. Stomach/Bowel: Stomach is within normal limits. No evidence of bowel obstruction. Normal appendix (series 2/image 97). Sigmoid diverticulosis, without evidence of diverticulitis. Vascular/Lymphatic: No evidence of abdominal aortic aneurysm. Atherosclerotic calcifications of the abdominal aorta and branch vessels. No suspicious abdominopelvic lymphadenopathy. Reproductive: Mild prostatomegaly. Other: No abdominopelvic ascites. Musculoskeletal: Mild degenerative changes of the lumbar spine, most prominent at L5-S1. IMPRESSION: 2.9 cm anterior left upper lobe nodule, favored to be minimally progressive. Continued attention on follow-up is suggested. Stable 12 mm short axis left axillary node, unchanged. Radiation changes in the right lung. No evidence of metastatic disease in the abdomen/pelvis. Electronically Signed   By: Julian Hy M.D.   On: 05/23/2020 09:31   CT Abdomen Pelvis W Contrast  Result Date: 05/23/2020 CLINICAL DATA:  Non-small cell lung cancer, chemotherapy and XRT complete, Keytruda in progress EXAM: CT CHEST, ABDOMEN, AND PELVIS WITH CONTRAST TECHNIQUE: Multidetector CT imaging of the chest, abdomen and pelvis was performed following the standard protocol during bolus administration of intravenous contrast. CONTRAST:  186mL OMNIPAQUE IOHEXOL 300  MG/ML  SOLN COMPARISON:  None. FINDINGS: CT CHEST FINDINGS Cardiovascular: The heart is normal in size. No pericardial effusion. No evidence of thoracic aortic aneurysm. Mild atherosclerotic calcifications of the aortic arch and descending thoracic aorta. Coronary atherosclerosis of the LAD and right coronary artery. Right chest port terminates at the cavoatrial junction. Mediastinum/Nodes: No suspicious mediastinal, hilar, or right axillary lymphadenopathy. 12 mm short axis left axillary node (series 2/image 8), unchanged. Visualized thyroid is unremarkable. Lungs/Pleura: Radiation changes in the right perihilar region and medial right lower lobe. Associated volume loss. 2.9 x 2.7 x 1.5 cm anterior left upper lobe nodule (series 6/image 39), previously 2.8 x 2.6 x 1.5 cm when remeasured in a similar fashion, favored to be minimally progressive. Mild biapical pleural-parenchymal scarring. No focal consolidation. No pleural effusion or pneumothorax. Musculoskeletal: Mild degenerative changes of the thoracic spine. CT ABDOMEN PELVIS FINDINGS Hepatobiliary: 15 mm central left hepatic lobe cyst (series 2/image 84). Liver is otherwise within normal limits. No suspicious/enhancing hepatic lesions. Gallbladder is unremarkable. No intrahepatic or extrahepatic  ductal dilatation. Pancreas: Within normal limits. Spleen: Within normal limits. Adrenals/Urinary Tract: Adrenal glands are within normal limits. Nonobstructing bilateral renal calculi measuring to 3 mm. No hydronephrosis. Bladder is within normal limits. Stomach/Bowel: Stomach is within normal limits. No evidence of bowel obstruction. Normal appendix (series 2/image 97). Sigmoid diverticulosis, without evidence of diverticulitis. Vascular/Lymphatic: No evidence of abdominal aortic aneurysm. Atherosclerotic calcifications of the abdominal aorta and branch vessels. No suspicious abdominopelvic lymphadenopathy. Reproductive: Mild prostatomegaly. Other: No abdominopelvic  ascites. Musculoskeletal: Mild degenerative changes of the lumbar spine, most prominent at L5-S1. IMPRESSION: 2.9 cm anterior left upper lobe nodule, favored to be minimally progressive. Continued attention on follow-up is suggested. Stable 12 mm short axis left axillary node, unchanged. Radiation changes in the right lung. No evidence of metastatic disease in the abdomen/pelvis. Electronically Signed   By: Julian Hy M.D.   On: 05/23/2020 09:31    ASSESSMENT AND PLAN: This is a very pleasant 74 years old white male with very light most smoking history recently diagnosed with stage IV (T3, N0, M1c) non-small cell lung cancer, squamous cell carcinoma based on the biopsy from the left axillary mass. He status post 4 cycles of induction systemic chemotherapy with carboplatin, paclitaxel and Keytruda with partial response.  The patient is currently on maintenance treatment with single agent Keytruda status post 33 cycles. The patient continues to tolerate his treatment well with no concerning adverse effects. I recommended for him to proceed with cycle #38 of his total treatment today as planned. He will come back for follow-up visit in 3 weeks for evaluation before the next cycle of his treatment. For the hypothyroidism, he will continue with the current dose of levothyroxine and will continue to monitor closely. He was advised to call immediately if he has any concerning symptoms in the interval. The patient voices understanding of current disease status and treatment options and is in agreement with the current care plan. All questions were answered. The patient knows to call the clinic with any problems, questions or concerns. We can certainly see the patient much sooner if necessary.  Disclaimer: This note was dictated with voice recognition software. Similar sounding words can inadvertently be transcribed and may not be corrected upon review.

## 2020-06-21 DIAGNOSIS — J22 Unspecified acute lower respiratory infection: Secondary | ICD-10-CM | POA: Diagnosis not present

## 2020-06-21 DIAGNOSIS — R062 Wheezing: Secondary | ICD-10-CM | POA: Diagnosis not present

## 2020-06-21 DIAGNOSIS — R059 Cough, unspecified: Secondary | ICD-10-CM | POA: Diagnosis not present

## 2020-06-21 DIAGNOSIS — J0100 Acute maxillary sinusitis, unspecified: Secondary | ICD-10-CM | POA: Diagnosis not present

## 2020-06-21 DIAGNOSIS — R0981 Nasal congestion: Secondary | ICD-10-CM | POA: Diagnosis not present

## 2020-06-24 DIAGNOSIS — M79671 Pain in right foot: Secondary | ICD-10-CM | POA: Diagnosis not present

## 2020-06-27 ENCOUNTER — Other Ambulatory Visit: Payer: Self-pay | Admitting: Family Medicine

## 2020-06-27 DIAGNOSIS — F5101 Primary insomnia: Secondary | ICD-10-CM

## 2020-06-28 ENCOUNTER — Telehealth: Payer: Self-pay | Admitting: Pharmacist

## 2020-06-28 DIAGNOSIS — Z23 Encounter for immunization: Secondary | ICD-10-CM | POA: Diagnosis not present

## 2020-06-28 NOTE — Telephone Encounter (Signed)
Medication Samples have been provided to the patient.  Drug name: Repatha      Strength: 140mg        Qty: 2 LOT: [id number]   Exp.Date: 12/2021   Corryn Madewell Rodriguez-Guzman PharmD, BCPS, [place] [address] [place] [address] 06/28/2020 9:05 AM

## 2020-06-30 ENCOUNTER — Encounter: Payer: Self-pay | Admitting: Physical Therapy

## 2020-06-30 ENCOUNTER — Other Ambulatory Visit: Payer: Self-pay

## 2020-06-30 ENCOUNTER — Ambulatory Visit: Payer: Medicare Other | Attending: Sports Medicine | Admitting: Physical Therapy

## 2020-06-30 DIAGNOSIS — R262 Difficulty in walking, not elsewhere classified: Secondary | ICD-10-CM | POA: Diagnosis not present

## 2020-06-30 DIAGNOSIS — M25571 Pain in right ankle and joints of right foot: Secondary | ICD-10-CM

## 2020-06-30 DIAGNOSIS — M25671 Stiffness of right ankle, not elsewhere classified: Secondary | ICD-10-CM | POA: Insufficient documentation

## 2020-06-30 DIAGNOSIS — M6281 Muscle weakness (generalized): Secondary | ICD-10-CM | POA: Diagnosis not present

## 2020-06-30 NOTE — Therapy (Signed)
[facility] [place], [place], [address] Phone: [phone]   Fax:  [phone]  Physical Therapy Evaluation  Patient Details  Name: Travis Padilla  MRN: [id number] Date of Birth: 1946/03/08 Referring Provider (PT): Vickki Hearing, MD   Encounter Date: 06/30/2020   PT End of Session - 06/30/20 1719    Visit Number 1    Number of Visits 8    Date for PT Re-Evaluation 08/11/20    Authorization Type Medicare; Progress note every 10th visit, KX modifer at 15th visit.    PT Start Time 1430    PT Stop Time 1515    PT Time Calculation (min) 45 min    Activity Tolerance Patient tolerated treatment well    Behavior During Therapy WFL for tasks assessed/performed           Past Medical History:  Diagnosis Date  . Abnormal nuclear stress test    December, 2013  . Anemia   . Axillary adenopathy 04/24/2018  . CAD (coronary artery disease)    Mild nonobstructive plaque in cath 2013  . Chest pain    December, 2013  . Cough with hemoptysis 04/22/2018  . Dizziness   . Dyslipidemia 07/14/2019  . Encounter for antineoplastic immunotherapy 05/13/2018  . Gout   . Hemorrhoid   . Hyperlipidemia   . Hypertension   . Hypothyroidism (acquired) 08/11/2018  . 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 Obesity, unspecified 03/28/2009   Qualifier: Diagnosis of  By: [name], MD, [name]    . Palpitations 03/28/2009   Qualifier: Diagnosis of  By: [name], MD, [name]    . Right lower lobe lung mass 04/22/2018  . Skin cancer   . SNORING 03/28/2009   Qualifier: Diagnosis of  By: [name], MD, [name]    . Spinal stenosis   . 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 BELPHAROPTOSIS REPAIR  Bilateral   . CATARACT EXTRACTION W/ INTRAOCULAR LENS  IMPLANT, BILATERAL    . COLONOSCOPY N/A 11/03/2014   Procedure: COLONOSCOPY;  Surgeon: Rogene Houston, MD;  Location: [facility];  Service: Endoscopy;  Laterality: N/A;  1225  . IR IMAGING GUIDED PORT INSERTION  07/11/2018  . KNEE CARTILAGE SURGERY Left    Left knee  . SKIN CANCER EXCISION  12/2014, 04/25/15  . VIDEO BRONCHOSCOPY Bilateral 05/09/2018   Procedure: VIDEO BRONCHOSCOPY WITH FLUORO;  Surgeon: Juanito Doom, MD;  Location: [facility];  Service: Cardiopulmonary;  Laterality: Bilateral;    There were no vitals filed for this visit.    Subjective Assessment - 06/30/20 1711    Subjective COVID-19 screening performed upon arrival. Patient arrives to physical therapy with reports of right foot pain after a slip and fall in August 2021. Patient reports moderate pain with walking and notices walking more on the lateral aspect of his right foot. Patient notices some changes in balance but unsure if that is due to neuropathy. Patient's goals are to decrease pain, improve movement, improve strength, improve walking tolerance, and have less difficulties with home activities.    Pertinent History Neuropathy, stage 4 lung cancer, CAD, Sinusitis    Limitations Walking;House hold activities;Standing    Diagnostic tests  x-ray: arthritis, previous hairline fracture    Patient Stated Goals improve walking    Currently in Pain? Yes    Pain Score 4     Pain Location Foot    Pain Orientation Right;Lateral;Medial    Pain Descriptors / Indicators Aching;Sharp;Discomfort   aching to lateral aspect, sharp in medial aspect   Pain Type Acute pain    Pain Onset More than a month ago    Pain Frequency Intermittent    Aggravating Factors  "activity"    Pain Relieving Factors "inactivity"    Effect of Pain on Daily Activities pain with walking              [facility] PT Assessment - 06/30/20 0001      Assessment   Medical Diagnosis Pain in  right foot    Referring Provider (PT) Vickki Hearing, MD    Onset Date/Surgical Date --   August 2021   Next MD Visit not made yet    Prior Therapy not for foot      Precautions   Precautions None      Restrictions   Weight Bearing Restrictions No      Balance Screen   Has the patient fallen in the past 6 months Yes    How many times? 1    Has the patient had a decrease in activity level because of a fear of falling?  Yes    Is the patient reluctant to leave their home because of a fear of falling?  No      Home Ecologist residence    Living Arrangements Spouse/significant other      Prior Function   Level of Independence Independent      Observation/Other Assessments-Edema    Edema Figure 8      Figure 8 Edema   Figure 8 - Right  59.5 cm    Figure 8 - Left  59.9 cm      Posture/Postural Control   Posture Comments equal standing weight distribution      ROM / Strength   AROM / PROM / Strength AROM;PROM;Strength      AROM   Overall AROM  Deficits    AROM Assessment Site Ankle    Right/Left Ankle Right    Right Ankle Dorsiflexion 2   6 KNEE FLEXED   Right Ankle Plantar Flexion 42    Right Ankle Inversion 38    Right Ankle Eversion 10      PROM   Overall PROM  Within functional limits for tasks performed    PROM Assessment Site Ankle    Right/Left Ankle Right    Right Ankle Dorsiflexion 10      Strength   Overall Strength Comments 12 R single leg HR; 19 L single leg HR    Strength Assessment Site Ankle    Right/Left Ankle Right    Right Ankle Dorsiflexion 4+/5    Right Ankle Plantar Flexion 4/5    Right Ankle Inversion 4-/5    Right Ankle Eversion 4/5      Palpation   Palpation comment slight tenderness to R 5th metatarsal tuberosity and navicular      Ambulation/Gait   Gait Pattern Step-through pattern;Decreased stance time - right;Decreased stride length;Decreased dorsiflexion - right;Decreased weight shift to  right;Abducted- right;Abducted - left;Wide base of support                      Objective measurements completed on  examination: See above findings.               PT Education - 06/30/20 1718    Education Details standing HR/TR, 4 way ankle with red theraband, eccentric HR, gastroc and soleus stretch sitting and standing    Person(s) Educated Patient    Methods Explanation;Demonstration;Handout    Comprehension Verbalized understanding;Returned demonstration               PT Long Term Goals - 06/30/20 1730      PT LONG TERM GOAL #1   Title Patient will be independent with HEP    Time 4    Period Weeks    Status Achieved      PT LONG TERM GOAL #2   Title Patient will demonstrate 8+ degrees of right ankle DF to improve gait mechanics.    Time 4    Period Weeks      PT LONG TERM GOAL #3   Title Patient will demonstrate 4+/5 or greater right ankle MMT in all planes to improve stability during functional tasks.    Time 4    Period Weeks    Status New      PT LONG TERM GOAL #4   Title Patient will report ability to walk for 20 mins for exercise routine with right ankle pain less than or equal to 3/10    Baseline --    Time 4    Period Weeks    Status New      PT LONG TERM GOAL #5   Title Patient will report ability to perform ADLs and home activities with right ankle pain less than or equal to 2/10.    Time 4    Period Weeks    Status New                  Plan - 06/30/20 1720    Clinical Impression Statement Patient is a 74 year old male who presents to physical therapy with right foot pain, decreased R ankle MMT, and difficulty walking secondary to a fall in August 2021. Patient with slight tenderness to right 5th metatarsal tuberosity and navicular during palpation. Patient ambulates with slight bilateral foot abduction, notable weight distribution to lateral aspect of R foot with decreased DF during heel to toe off. Patient and PT  discussed at length POC and HEP as patient will be out of town for a couple of days. Patient provided with HEP and red theraband to which he reported understanding. Patient would benefit from skilled physical therapy to address deficits and address patient's goals.    Personal Factors and Comorbidities Age;Comorbidity 2;Time since onset of injury/illness/exacerbation    Comorbidities neuropathy, stage 4 lung cancer, CAD, Sinusitis    Examination-Activity Limitations Locomotion Level;Transfers;Stand    Stability/Clinical Decision Making Stable/Uncomplicated    Clinical Decision Making Low    Rehab Potential Good    PT Frequency 2x / week    PT Duration 4 weeks    PT Treatment/Interventions ADLs/Self Care Home Management;Cryotherapy;Electrical Stimulation;Moist Heat;Ultrasound;Stair training;Gait training;Functional mobility training;Therapeutic activities;Therapeutic exercise;Balance training;Neuromuscular re-education;Manual techniques;Passive range of motion;Patient/family education;Vasopneumatic Device;Taping    PT Next Visit Plan nustep, ankle strengthening, standing balance activities, e-stim for pain as needed.    PT Home Exercise Plan see patient education section    Consulted and Agree with Plan of Care Patient           Patient will benefit from skilled therapeutic intervention in order to improve the following deficits  and impairments:  Abnormal gait, Difficulty walking, Decreased range of motion, Decreased activity tolerance, Decreased balance, Decreased strength, Pain  Visit Diagnosis: Pain in right ankle and joints of right foot - Plan: PT plan of care cert/re-cert  Stiffness of right ankle, not elsewhere classified - Plan: PT plan of care cert/re-cert  Muscle weakness (generalized) - Plan: PT plan of care cert/re-cert  Difficulty in walking, not elsewhere classified - Plan: PT plan of care cert/re-cert     Problem List Patient Active Problem List   Diagnosis Date Noted   . 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 Itching 11/17/2019  . Dyslipidemia 07/14/2019  . Shoulder pain 03/31/2019  . Port-A-Cath in place 08/11/2018  . Hypothyroidism (acquired) 08/11/2018  . 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 Encounter for antineoplastic chemotherapy 05/13/2018  . Encounter for antineoplastic immunotherapy 05/13/2018  . Goals of care, counseling/discussion 05/13/2018  . 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 Axillary adenopathy 04/24/2018  . Right lower lobe lung mass 04/22/2018  . Cough with hemoptysis 04/22/2018  . Wheezing 04/02/2018  . Long-term use of high-risk medication 04/02/2018  . Dizziness 06/26/2017  . Fatigue 04/30/2016  . Insomnia 04/30/2016  . Hyperlipidemia 01/31/2016  . Ear pain 05/09/2015  . Anemia   . Abnormal nuclear stress test   . OBESITY, UNSPECIFIED 03/28/2009  . ESSENTIAL HYPERTENSION, BENIGN 03/28/2009  . PALPITATIONS 03/28/2009  . SNORING 03/28/2009    Gabriela Eves, PT, DPT 06/30/2020, 5:39 PM  [facility] [address] [place], [place], [address] Phone: [phone]   Fax:  [phone]  Name: Travis Padilla MRN: [id number] Date of Birth: 1946-07-03

## 2020-07-01 ENCOUNTER — Other Ambulatory Visit: Payer: Self-pay | Admitting: Internal Medicine

## 2020-07-01 ENCOUNTER — Encounter: Payer: Self-pay | Admitting: Pulmonary Disease

## 2020-07-01 ENCOUNTER — Ambulatory Visit (INDEPENDENT_AMBULATORY_CARE_PROVIDER_SITE_OTHER): Payer: Medicare Other | Admitting: Pulmonary Disease

## 2020-07-01 VITALS — BP 114/64 | HR 80 | Temp 97.3°F | Ht 74.0 in | Wt 238.6 lb

## 2020-07-01 DIAGNOSIS — C3491 Malignant neoplasm of unspecified part of right bronchus or lung: Secondary | ICD-10-CM

## 2020-07-01 DIAGNOSIS — J0100 Acute maxillary sinusitis, unspecified: Secondary | ICD-10-CM | POA: Diagnosis not present

## 2020-07-01 MED ORDER — FLUTICASONE PROPIONATE 50 MCG/ACT NA SUSP: 1.0000 | Freq: Every day | NASAL | 3 refills | Status: DC

## 2020-07-01 NOTE — Patient Instructions (Addendum)
You were seen today by Lauraine Rinne, NP  for:   1. Acute maxillary sinusitis  - fluticasone (FLONASE) 50 MCG/ACT nasal spray; Place 1 spray into both nostrils daily as needed.  Dispense: 16 g; Refill: 3  Start nasal saline rinses twice daily Use distilled water Shake well Get bottle lukewarm like a baby bottle  Would recommend utilizing nasal saline rinses twice daily for at least the next 7 to 14 days  Utilize fluticasone/Flonase nasal spray after nasal saline rinses as needed for nasal congestion  If fevers develop, facial pain starts or you begin to have worsened discolored nasal drainage please contact our office or your primary care  Okay to continue to use albuterol nebulized meds every 6 hours as needed for shortness of breath or wheezing  2. 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[place])  Continue follow-up with Dr. Earlie Server    We recommend today:   Meds ordered this encounter  Medications  . fluticasone (FLONASE) 50 MCG/ACT nasal spray    Sig: Place 1 spray into both nostrils daily.    Dispense:  16 g    Refill:  3    Follow Up:    Return in about 3 months (around 10/01/2020), or if symptoms worsen or fail to improve, for Follow up with Dr. Valeta Harms, Follow up with Dr. Lamonte Sakai, [place]. Patient needs to establish with either Dr. Valeta Harms or Dr. Lamonte Sakai in a 30-minute time slot, former Dr. Lake Bells patient  Notification of test results are managed in the following manner: If there are  any recommendations or changes to the  plan of care discussed in office today,  we will contact you and let you know what they are. If you do not hear from [place], then your results are normal and you can view them through your  MyChart account , or a letter will be sent to you. Thank you again for trusting [place] with your care  - Thank you, [facility]    It is flu season:   >>> Best ways to protect herself from the flu: Receive the yearly flu vaccine, practice good hand hygiene  washing with soap and also using hand sanitizer when available, eat a nutritious meals, get adequate rest, hydrate appropriately       Please contact the office if your symptoms worsen or you have concerns that you are not improving.   Thank you for choosing [facility] for your healthcare, and for allowing [place] to partner with you on your healthcare journey. I am thankful to be able to provide care to you today.   Wyn Quaker FNP-C     Sinusitis, Adult Sinusitis is soreness and swelling (inflammation) of your sinuses. Sinuses are hollow spaces in the bones around your face. They are located:  Around your eyes.  In the middle of your forehead.  Behind your nose.  In your cheekbones. Your sinuses and nasal passages are lined with a fluid called mucus. Mucus drains out of your sinuses. Swelling can trap mucus in your sinuses. This lets germs (bacteria, virus, or fungus) grow, which leads to infection. Most of the time, this condition is caused by a virus. What are the causes? This condition is caused by:  Allergies.  Asthma.  Germs.  Things that block your nose or sinuses.  Growths in the nose (nasal polyps).  Chemicals or irritants in the air.  Fungus (rare). What increases the risk? You are more likely to develop this condition  if:  You have a weak body defense system (immune system).  You do a lot of swimming or diving.  You use nasal sprays too much.  You smoke. What are the signs or symptoms? The main symptoms of this condition are pain and a feeling of pressure around the sinuses. Other symptoms include:  Stuffy nose (congestion).  Runny nose (drainage).  Swelling and warmth in the sinuses.  Headache.  Toothache.  A cough that may get worse at night.  Mucus that collects in the throat or the back of the nose (postnasal drip).  Being unable to smell and taste.  Being very tired (fatigue).  A fever.  Sore throat.  Bad  breath. How is this diagnosed? This condition is diagnosed based on:  Your symptoms.  Your medical history.  A physical exam.  Tests to find out if your condition is short-term (acute) or long-term (chronic). Your doctor may: ? Check your nose for growths (polyps). ? Check your sinuses using a tool that has a light (endoscope). ? Check for allergies or germs. ? Do imaging tests, such as an MRI or CT scan. How is this treated? Treatment for this condition depends on the cause and whether it is short-term or long-term.  If caused by a virus, your symptoms should go away on their own within 10 days. You may be given medicines to relieve symptoms. They include: ? Medicines that shrink swollen tissue in the nose. ? Medicines that treat allergies (antihistamines). ? A spray that treats swelling of the nostrils. ? Rinses that help get rid of thick mucus in your nose (nasal saline washes).  If caused by bacteria, your doctor may wait to see if you will get better without treatment. You may be given antibiotic medicine if you have: ? A very bad infection. ? A weak body defense system.  If caused by growths in the nose, you may need to have surgery. Follow these instructions at home: Medicines  Take, use, or apply over-the-counter and prescription medicines only as told by your doctor. These may include nasal sprays.  If you were prescribed an antibiotic medicine, take it as told by your doctor. Do not stop taking the antibiotic even if you start to feel better. Hydrate and humidify   Drink enough water to keep your pee (urine) pale yellow.  Use a cool mist humidifier to keep the humidity level in your home above 50%.  Breathe in steam for 10-15 minutes, 3-4 times a day, or as told by your doctor. You can do this in the bathroom while a hot shower is running.  Try not to spend time in cool or dry air. Rest  Rest as much as you can.  Sleep with your head raised  (elevated).  Make sure you get enough sleep each night. General instructions   Put a warm, moist washcloth on your face 3-4 times a day, or as often as told by your doctor. This will help with discomfort.  Wash your hands often with soap and water. If there is no soap and water, use hand sanitizer.  Do not smoke. Avoid being around people who are smoking (secondhand smoke).  Keep all follow-up visits as told by your doctor. This is important. Contact a doctor if:  You have a fever.  Your symptoms get worse.  Your symptoms do not get better within 10 days. Get help right away if:  You have a very bad headache.  You cannot stop throwing up (vomiting).  You have very bad pain or swelling around your face or eyes.  You have trouble seeing.  You feel confused.  Your neck is stiff.  You have trouble breathing. Summary  Sinusitis is swelling of your sinuses. Sinuses are hollow spaces in the bones around your face.  This condition is caused by tissues in your nose that become inflamed or swollen. This traps germs. These can lead to infection.  If you were prescribed an antibiotic medicine, take it as told by your doctor. Do not stop taking it even if you start to feel better.  Keep all follow-up visits as told by your doctor. This is important. This information is not intended to replace advice given to you by your health care provider. Make sure you discuss any questions you have with your health care provider. Document Revised: 01/27/2018 Document Reviewed: 01/27/2018 Elsevier Patient Education  [place].

## 2020-07-01 NOTE — Progress Notes (Signed)
@Patient  ID: Travis Padilla, male    DOB: [birth year], 74 y.o.   MRN: [id number]  Chief Complaint  Patient presents with   Follow-up    SOB, DOE    Referring provider: [name], DO  HPI:  74 year old male former smoker followed in our office for squamous cell lung cancer, dyspnea, cough  PMH: Obesity, hypertension, anemia, insomnia, hypothyroidism, dyslipidemia Smoker/ Smoking History: Former smoker. Maintenance:   Pt of: Needs outpatient pulmonologist, previous patient of Dr. Lake Bells  07/01/2020  - Visit   74 year old male former smoker last seen in our office in September/2019 by Dr. Lake Bells.  Plan of care from that office visit was as follows:  Patient does not have COPD.  He was encouraged to continue to follow-up with oncology clinic for his squamous cell lung cancer.  He was recommended to obtain flu vaccine.  He was encouraged to follow-up with our office on an as-needed basis.  Chart review shows the patient does continue to follow with Dr. Earlie Server with oncology.  Last office visit was on 06/13/2020.  The assessment and plan from that office visit was as follows:  ASSESSMENT AND PLAN: This is a very pleasant 74 years old white male with very light most smoking history recently diagnosed with stage IV (T3, N0, M1c) non-small cell lung cancer, squamous cell carcinoma based on the biopsy from the left axillary mass. He status post 4 cycles of induction systemic chemotherapy with carboplatin, paclitaxel and Keytruda with partial response.  The patient is currently on maintenance treatment with single agent Keytruda status post 33 cycles. The patient continues to tolerate his treatment well with no concerning adverse effects. I recommended for him to proceed with cycle #38 of his total treatment today as planned. He will come back for follow-up visit in 3 weeks for evaluation before the next cycle of his treatment. For the hypothyroidism, he will continue with the  current dose of levothyroxine and will continue to monitor closely. He was advised to call immediately if he has any concerning symptoms in the interval. The patient voices understanding of current disease status and treatment options and is in agreement with the current care plan. All questions were answered. The patient knows to call the clinic with any problems, questions or concerns. We can certainly see the patient much sooner if necessary.  Patient reporting is worsen sinus congestion and nasal drainage the beginning of September/2021.  He presented to an urgent care on 05/17/2020.  The assessment and plan from that visit is listed below:  05/17/20  Assessment:   Diagnosis ICD-10-CM Associated Orders  1. Cough R05 POCT Rapid Influenza A/B, RSV, SARS-CoV2 NAA  doxycycline (VIBRA-TABS) 100 MG tablet  2. Nasal congestion R09.81 POCT Rapid Influenza A/B, RSV, SARS-CoV2 NAA    Plan:   1. Labs and medications from today's visit: Orders Placed This Encounter  Procedures   POCT Rapid Influenza A/B, RSV, SARS-CoV2 NAA   Patient symptoms initially improved and then started to worsen.  He reports the flu and Covid testing that time were negative.  He then presented back to the urgent care on 06/21/2020. It appears patient was seen on 06/21/2020 and diagnosed with bronchitis.  He was treated with clarithromycin as well as prednisone taper and given albuterol nebulizer.  This was an urgent care.  The assessment and plan from that office visit is listed below:  Final diagnoses:  Sinus congestion (Primary)  Unspecified acute lower respiratory infection  Subacute maxillary sinusitis  Cough  Urgent Care Assessment/Plan  Albuterol neb tx with atrovent given to patient with good results.  Lungs with wheezes right chest after neb treatment.  Discussed imaging, labs, medical decision-making, plan for follow-up with patient. Discussed signs and symptoms that should prompt return to the emergency  department. Patient agrees with plan.  New Prescriptions  ALBUTEROL 2.5 MG /3 ML (0.083 %) NEBULIZER SOLUTION Inhale 3 mL (2.5 mg total) by nebulization every four (4) hours as needed for wheezing.  CLARITHROMYCIN (BIAXIN) 500 MG TABLET Take 1 tablet (500 mg total) by mouth Two (2) times a day for 10 days.  PREDNISONE (DELTASONE) 5 MG TABLET Take 6-5-4-3-2-1 po qd     Patient presents today for follow-up as well as also to establish with a new pulmonologist.  Patient reports that his symptoms overall are improving.  He has finished his antibiotics.  Nasal drainage as well as sputum production continues to be discolored but color is improved from persistently dark green to a light white to yellow.  He is very active he walks 3-4 times a week, does yoga 3-4 times a week as well as though does spin cycle 1 time every other week.  His activity levels have been limited due to recent back and ankle pain.  Patient was encouraged by urgent care providers to raise the albuterol nebs.  He has been utilizing his previous prescription from 2019.  These nebulized meds are actually expired.  He last had to use his albuterol nebulized meds yesterday.  He is not currently using his Flonase or nasal saline rinses.  He does not report that he has frequent sinus infections or seasonal sinobronchitis episodes.  He does feel he has a lot of postnasal drip.   Questionaires / Pulmonary Flowsheets:   ACT:  No flowsheet data found.  MMRC: No flowsheet data found.  Epworth:  No flowsheet data found.  Tests:   FENO:  No results found for: NITRICOXIDE  PFT: PFT Results Latest Ref Rng & Units 05/30/2018  FVC-Pre L 4.40  FVC-Predicted Pre % 93  FVC-Post L 4.26  FVC-Predicted Post % 90  Pre FEV1/FVC % % 80  Post FEV1/FCV % % 80  FEV1-Pre L 3.52  FEV1-Predicted Pre % 101  FEV1-Post L 3.40  DLCO uncorrected ml/min/mmHg 25.71  DLCO UNC% % 73  DLCO corrected ml/min/mmHg 27.59  DLCO COR %Predicted % 78  DLVA  Predicted % 86    WALK:  No flowsheet data found.  Imaging: No results found.  Lab Results:  CBC    Component Value Date/Time   WBC 5.0 06/13/2020 0826   WBC 28.8 (H) 07/25/2018 2031   RBC 4.01 (L) 06/13/2020 0826   HGB 12.6 (L) 06/13/2020 0826   HGB 13.0 12/04/2017 1202   HCT 37.3 (L) 06/13/2020 0826   HCT 39.6 12/04/2017 1202   PLT 151 06/13/2020 0826   PLT 218 12/04/2017 1202   MCV 93.0 06/13/2020 0826   MCV 92 12/04/2017 1202   MCH 31.4 06/13/2020 0826   MCHC 33.8 06/13/2020 0826   RDW 12.7 06/13/2020 0826   RDW 13.8 12/04/2017 1202   LYMPHSABS 1.0 06/13/2020 0826   LYMPHSABS 1.8 12/04/2017 1202   MONOABS 0.8 06/13/2020 0826   EOSABS 0.5 06/13/2020 0826   EOSABS 0.2 12/04/2017 1202   BASOSABS 0.0 06/13/2020 0826   BASOSABS 0.0 12/04/2017 1202    BMET    Component Value Date/Time   NA 140 06/13/2020 0826   NA 144 04/09/2018 1507   K 4.5  06/13/2020 0826   CL 110 06/13/2020 0826   CO2 26 06/13/2020 0826   GLUCOSE 101 (H) 06/13/2020 0826   BUN 13 06/13/2020 0826   BUN 15 04/09/2018 1507   CREATININE 0.96 06/13/2020 0826   CALCIUM 8.8 (L) 06/13/2020 0826   GFRNONAA >60 06/13/2020 0826   GFRAA >60 06/13/2020 0826    BNP No results found for: BNP  ProBNP No results found for: PROBNP  Specialty Problems      Pulmonary Problems   SNORING    Qualifier: Diagnosis of  By: [name], MD, [name]        Sinusitis   Wheezing   Cough with hemoptysis   Right lower lobe lung mass   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HCC)   Metastasis to lung (HCC)      Allergies  Allergen Reactions   Pravastatin     Numbness in feet     Immunization History  Administered Date(s) Administered   Influenza Nasal 06/29/2016   Influenza Split 05/14/2012   Influenza, High Dose Seasonal PF 08/03/2014, 05/29/2015, 07/22/2017, 06/11/2018, 05/05/2019, 06/28/2020   Influenza-Unspecified 06/29/2016   PFIZER SARS-COV-2 Vaccination 10/15/2019,  06/13/2020   Pneumococcal Conjugate-13 03/30/2015   Pneumococcal Polysaccharide-23 07/21/2012   Td 02/23/2015   Zoster 07/21/2012    Past Medical History:  Diagnosis Date   Abnormal nuclear stress test    December, 2013   Anemia    Axillary adenopathy 04/24/2018   CAD (coronary artery disease)    Mild nonobstructive plaque in cath 2013   Chest pain    December, 2013   Cough with hemoptysis 04/22/2018   Dizziness    Dyslipidemia 07/14/2019   Encounter for antineoplastic immunotherapy 05/13/2018   Gout    Hemorrhoid    Hyperlipidemia    Hypertension    Hypothyroidism (acquired) 08/11/2018   Metastasis to lung ([place]) 05/19/2018   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) ([place]) dx'd 04/17/18   to LN, infrahilar mass, lung nodule and lt axilla   Obesity, unspecified 03/28/2009   Qualifier: Diagnosis of  By: [name], MD, [name]     Palpitations 03/28/2009   Qualifier: Diagnosis of  By: [name], MD, [name]     Right lower lobe lung mass 04/22/2018   Skin cancer    SNORING 03/28/2009   Qualifier: Diagnosis of  By: [name], MD, [name]     Spinal stenosis    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[place]) 05/13/2018    Tobacco History: Social History   Tobacco Use  Smoking Status Former Smoker   Packs/day: 0.20   Years: 2.00   Pack years: 0.40   Types: Cigarettes   Start date: 10/01/1968  Smokeless Tobacco Never Used   Counseling given: Not Answered   Continue to not smoke  Outpatient Encounter Medications as of 07/01/2020  Medication Sig   albuterol (PROVENTIL) (2.5 MG/3ML) 0.083% nebulizer solution Inhale into the lungs.   Alirocumab (PRALUENT) 150 MG/ML SOAJ Inject 150 mg into the skin every 14 (fourteen) days.   Apple Cider Vinegar 500 MG TABS Take by mouth.   Artificial Tear Solution (SOOTHE XP OP) Place 1 drop into both eyes 2 (two) times daily.   carvedilol (COREG) 12.5 MG tablet Take 1 tablet  (12.5 mg total) by mouth in the morning, at noon, and at bedtime.   Cyanocobalamin (VITAMIN B 12) 250 MCG LOZG Take by mouth.   diphenhydramine-acetaminophen (TYLENOL PM) 25-500 MG TABS tablet Take  1-2 tablets by mouth at bedtime as needed (pain).   ferrous sulfate 324 MG TBEC Take 324 mg by mouth daily with breakfast.   lidocaine-prilocaine (EMLA) cream Apply 1 application topically as needed.   methocarbamol (ROBAXIN) 500 MG tablet Take 500 mg by mouth daily.   Multiple Vitamins-Minerals (PRESERVISION AREDS 2 PO) Take by mouth.   naproxen (NAPROSYN) 375 MG tablet Take 375 mg by mouth 2 (two) times daily.   oxyCODONE-acetaminophen (PERCOCET/ROXICET) 5-325 MG tablet Take 1 tablet by mouth every 8 (eight) hours as needed for severe pain.   senna-docusate (SENNA S) 8.6-50 MG tablet 1 to 2 twice daily for constipation   traZODone (DESYREL) 50 MG tablet TAKE 1 TO 2 TABLETS BY MOUTH AT BEDTIME AS NEEDED FOR SLEEP   [DISCONTINUED] levothyroxine (SYNTHROID) 150 MCG tablet TAKE 1 TABLET BY MOUTH EVERY DAY BEFORE BREAKFAST   fluticasone (FLONASE) 50 MCG/ACT nasal spray Place 1 spray into both nostrils daily.   No facility-administered encounter medications on file as of 07/01/2020.     Review of Systems  Review of Systems  Constitutional: Positive for fatigue. Negative for activity change, chills, fever and unexpected weight change.  HENT: Positive for congestion (was green drainage, improved now ), postnasal drip and rhinorrhea. Negative for sinus pressure, sinus pain and sore throat.   Eyes: Negative.   Respiratory: Positive for cough and wheezing (improving ). Negative for shortness of breath.   Cardiovascular: Negative for chest pain and palpitations.  Gastrointestinal: Negative for constipation, diarrhea, nausea and vomiting.  Endocrine: Negative.   Genitourinary: Negative.   Musculoskeletal: Negative.   Skin: Negative.   Neurological: Negative for dizziness and headaches.    Psychiatric/Behavioral: Negative.  Negative for dysphoric mood. The patient is not nervous/anxious.   All other systems reviewed and are negative.    Physical Exam  BP 114/64    Pulse 80    Temp (!) 97.3 F (36.3 C) (Oral)    Ht 6\' 2"  (1.88 m)    Wt 238 lb 9.6 oz (108.2 kg)    SpO2 99%    BMI 30.63 kg/m   Wt Readings from Last 5 Encounters:  07/01/20 238 lb 9.6 oz (108.2 kg)  06/13/20 241 lb 12.8 oz (109.7 kg)  05/24/20 238 lb 1.6 oz (108 kg)  05/03/20 239 lb 12.8 oz (108.8 kg)  04/12/20 234 lb (106.1 kg)    BMI Readings from Last 5 Encounters:  07/01/20 30.63 kg/m  06/13/20 31.05 kg/m  05/24/20 30.57 kg/m  05/03/20 30.79 kg/m  04/12/20 30.04 kg/m     Physical Exam Vitals and nursing note reviewed.  Constitutional:      General: He is not in acute distress.    Appearance: Normal appearance. He is obese.  HENT:     Head: Normocephalic and atraumatic.     Right Ear: Hearing, tympanic membrane, ear canal and external ear normal.     Left Ear: Hearing, tympanic membrane, ear canal and external ear normal.     Nose: Mucosal edema, congestion and rhinorrhea present.     Right Turbinates: Not enlarged.     Left Turbinates: Not enlarged.     Mouth/Throat:     Mouth: Mucous membranes are dry.     Pharynx: Oropharynx is clear. No oropharyngeal exudate.  Eyes:     Pupils: Pupils are equal, round, and reactive to light.  Cardiovascular:     Rate and Rhythm: Normal rate and regular rhythm.     Pulses: Normal pulses.  Heart sounds: Normal heart sounds. No murmur heard.   Pulmonary:     Effort: Pulmonary effort is normal.     Breath sounds: Normal breath sounds. No decreased breath sounds, wheezing or rales.  Musculoskeletal:     Cervical back: Normal range of motion.     Right lower leg: No edema.     Left lower leg: No edema.  Lymphadenopathy:     Cervical: No cervical adenopathy.  Skin:    General: Skin is warm and dry.     Capillary Refill: Capillary refill  takes less than 2 seconds.     Findings: No erythema or rash.  Neurological:     General: No focal deficit present.     Mental Status: He is alert and oriented to person, place, and time.     Motor: No weakness.     Coordination: Coordination normal.     Gait: Gait is intact. Gait normal.  Psychiatric:        Mood and Affect: Mood normal.        Behavior: Behavior normal. Behavior is cooperative.        Thought Content: Thought content normal.        Judgment: Judgment normal.       Assessment & Plan:   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HCC) Plan: Continue follow-up with Dr. Earlie Server Continue CT imaging as outlined by oncology team  Sinusitis 2 rounds of antibiotics, most recent being clarithromycin Nasal congestion and significant postnasal drip on exam today Patient reporting clinical improvement status post Biaxin prescription  Plan: Start nasal saline rinses 2 times daily Start Flonase If symptoms worsen please contact our office May need to consider ear nose and throat referral if symptoms return or fevers develop with facial pain Maybe consider CT sinus imaging if symptoms worsen or relapse Follow-up in 3 months to establish care in a 30-minute time slot with Dr. Lamonte Sakai or Dr. Valeta Harms, former Dr. Lake Bells patient    Return in about 3 months (around 10/01/2020), or if symptoms worsen or fail to improve, for Follow up with Dr. Valeta Harms, Follow up with Dr. Lamonte Sakai, [place].   Lauraine Rinne, NP 07/01/2020   This appointment required 32 minutes of patient care (this includes precharting, chart review, review of results, face-to-face care, etc.).

## 2020-07-01 NOTE — Assessment & Plan Note (Signed)
Plan: Continue follow-up with Dr. Earlie Server Continue CT imaging as outlined by oncology team

## 2020-07-01 NOTE — Assessment & Plan Note (Signed)
2 rounds of antibiotics, most recent being clarithromycin Nasal congestion and significant postnasal drip on exam today Patient reporting clinical improvement status post Biaxin prescription  Plan: Start nasal saline rinses 2 times daily Start Flonase If symptoms worsen please contact our office May need to consider ear nose and throat referral if symptoms return or fevers develop with facial pain Maybe consider CT sinus imaging if symptoms worsen or relapse Follow-up in 3 months to establish care in a 30-minute time slot with Dr. Lamonte Sakai or Dr. Valeta Harms, former Dr. Lake Bells patient

## 2020-07-02 NOTE — Progress Notes (Signed)
PCCM: Thanks, happy to see him  Garner Nash, DO [facility] 07/02/2020 5:35 PM

## 2020-07-05 ENCOUNTER — Inpatient Hospital Stay (HOSPITAL_BASED_OUTPATIENT_CLINIC_OR_DEPARTMENT_OTHER): Payer: Medicare Other | Admitting: Internal Medicine

## 2020-07-05 ENCOUNTER — Other Ambulatory Visit: Payer: Self-pay

## 2020-07-05 ENCOUNTER — Inpatient Hospital Stay: Payer: Medicare Other

## 2020-07-05 ENCOUNTER — Encounter: Payer: Self-pay | Admitting: Internal Medicine

## 2020-07-05 VITALS — BP 143/90 | HR 71 | Temp 97.7°F | Resp 18 | Ht 74.0 in | Wt 245.3 lb

## 2020-07-05 DIAGNOSIS — C3491 Malignant neoplasm of unspecified part of right bronchus or lung: Secondary | ICD-10-CM

## 2020-07-05 DIAGNOSIS — C773 Secondary and unspecified malignant neoplasm of axilla and upper limb lymph nodes: Secondary | ICD-10-CM | POA: Diagnosis not present

## 2020-07-05 DIAGNOSIS — Z9221 Personal history of antineoplastic chemotherapy: Secondary | ICD-10-CM | POA: Diagnosis not present

## 2020-07-05 DIAGNOSIS — Z5112 Encounter for antineoplastic immunotherapy: Secondary | ICD-10-CM | POA: Diagnosis not present

## 2020-07-05 DIAGNOSIS — Z95828 Presence of other vascular implants and grafts: Secondary | ICD-10-CM

## 2020-07-05 DIAGNOSIS — C7801 Secondary malignant neoplasm of right lung: Secondary | ICD-10-CM

## 2020-07-05 DIAGNOSIS — I10 Essential (primary) hypertension: Secondary | ICD-10-CM | POA: Diagnosis not present

## 2020-07-05 DIAGNOSIS — R0989 Other specified symptoms and signs involving the circulatory and respiratory systems: Secondary | ICD-10-CM | POA: Diagnosis not present

## 2020-07-05 DIAGNOSIS — E039 Hypothyroidism, unspecified: Secondary | ICD-10-CM

## 2020-07-05 DIAGNOSIS — Z923 Personal history of irradiation: Secondary | ICD-10-CM | POA: Diagnosis not present

## 2020-07-05 DIAGNOSIS — C3431 Malignant neoplasm of lower lobe, right bronchus or lung: Secondary | ICD-10-CM | POA: Diagnosis not present

## 2020-07-05 LAB — CMP (CANCER CENTER ONLY)
ALT: 15 U/L (ref 0–44)
AST: 17 U/L (ref 15–41)
Albumin: 3.7 g/dL (ref 3.5–5.0)
Alkaline Phosphatase: 69 U/L (ref 38–126)
Anion gap: 6 (ref 5–15)
BUN: 19 mg/dL (ref 8–23)
CO2: 25 mmol/L (ref 22–32)
Calcium: 9.1 mg/dL (ref 8.9–10.3)
Chloride: 109 mmol/L (ref 98–111)
Creatinine: 0.86 mg/dL (ref 0.61–1.24)
GFR, Estimated: >60 mL/min (ref >60)
Glucose, Bld: 95 mg/dL (ref 70–99)
Potassium: 4.4 mmol/L (ref 3.5–5.1)
Sodium: 140 mmol/L (ref 135–145)
Total Bilirubin: 0.5 mg/dL (ref 0.3–1.2)
Total Protein: 7 g/dL (ref 6.5–8.1)

## 2020-07-05 LAB — CBC WITH DIFFERENTIAL (CANCER CENTER ONLY)
Abs Immature Granulocytes: 0.04 10*3/uL (ref 0.00–0.07)
Basophils Absolute: 0 10*3/uL (ref 0.0–0.1)
Basophils Relative: 1 %
Eosinophils Absolute: 0.3 10*3/uL (ref 0.0–0.5)
Eosinophils Relative: 5 %
HCT: 37 % — ABNORMAL LOW (ref 39.0–52.0)
Hemoglobin: 12.5 g/dL — ABNORMAL LOW (ref 13.0–17.0)
Immature Granulocytes: 1 %
Lymphocytes Relative: 23 %
Lymphs Abs: 1.3 10*3/uL (ref 0.7–4.0)
MCH: 31.7 pg (ref 26.0–34.0)
MCHC: 33.8 g/dL (ref 30.0–36.0)
MCV: 93.9 fL (ref 80.0–100.0)
Monocytes Absolute: 0.9 10*3/uL (ref 0.1–1.0)
Monocytes Relative: 15 %
Neutro Abs: 3.4 10*3/uL (ref 1.7–7.7)
Neutrophils Relative %: 55 %
Platelet Count: 151 10*3/uL (ref 150–400)
RBC: 3.94 MIL/uL — ABNORMAL LOW (ref 4.22–5.81)
RDW: 12.9 % (ref 11.5–15.5)
WBC Count: 6 10*3/uL (ref 4.0–10.5)
nRBC: 0 % (ref 0.0–0.2)

## 2020-07-05 LAB — TSH: TSH: 2.993 u[IU]/mL (ref 0.320–4.118)

## 2020-07-05 MED ORDER — HEPARIN SOD (PORK) LOCK FLUSH 100 UNIT/ML IV SOLN
500.0000 [IU] | Freq: Once | INTRAVENOUS | Status: AC | PRN
  Administered 2020-07-05: 500 [IU]
  Filled 2020-07-05: qty 5

## 2020-07-05 MED ORDER — SODIUM CHLORIDE 0.9 % IV SOLN
Freq: Once | INTRAVENOUS | Status: AC
  Filled 2020-07-05: qty 250

## 2020-07-05 MED ORDER — SODIUM CHLORIDE 0.9% FLUSH
10.0000 mL | INTRAVENOUS | Status: DC | PRN
  Administered 2020-07-05: 10 mL
  Filled 2020-07-05: qty 10

## 2020-07-05 MED ORDER — SODIUM CHLORIDE 0.9 % IV SOLN
200.0000 mg | Freq: Once | INTRAVENOUS | Status: AC
  Administered 2020-07-05: 200 mg via INTRAVENOUS
  Filled 2020-07-05: qty 8

## 2020-07-05 MED ORDER — SODIUM CHLORIDE 0.9% FLUSH
10.0000 mL | Freq: Once | INTRAVENOUS | Status: AC
  Administered 2020-07-05: 10 mL
  Filled 2020-07-05: qty 10

## 2020-07-05 NOTE — Progress Notes (Signed)
[facility] [phone]   Fax:([phone]) [phone]  OFFICE PROGRESS NOTE  Travis Norlander, DO [place] [address]  DIAGNOSIS: Stage IV (T3, N0, M1c) non-small cell lung cancer, squamous cell carcinoma presented with large right infrahilar mass in addition to left upper lobe lung nodule as well as left axillary mass with left axillary lymph node diagnosed in August 2019.  PRIOR THERAPY:  1) Palliative radiotherapy to the right infrahilar mass as well as the axillary mass under the care of Dr. Lisbeth Renshaw. 2) Systemic chemotherapy with carboplatin for AUC of 5, paclitaxel 175 mg/M2 and Keytruda 200 mg IV every 3 weeks status post 4 cycles.  CURRENT THERAPY: Maintenance immunotherapy with single agent Keytruda 200 mg IV every 3 weeks status post 34 cycles.  INTERVAL HISTORY: Travis Padilla 74 y.o. male returns to the clinic today for follow-up visit.  The patient is feeling fine today with no concerning complaints except for the chest congestion.  He was seen by pulmonary medicine and was giving nasal sprays as well as albuterol.  He denied having any current chest pain, shortness of breath, cough or hemoptysis.  He denied having any fever or chills.  He has no nausea, vomiting, diarrhea or constipation.  He has no headache or visual changes.  He continues to tolerate his maintenance treatment with Keytruda fairly well.  The patient is here today for evaluation before starting cycle #39 of his treatment.  MEDICAL HISTORY: Past Medical History:  Diagnosis Date  . Abnormal nuclear stress test    December, 2013  . Anemia   . Axillary adenopathy 04/24/2018  . CAD (coronary artery disease)    Mild nonobstructive plaque in cath 2013  . Chest pain    December, 2013  . Cough with hemoptysis 04/22/2018  . Dizziness   . Dyslipidemia 07/14/2019  . Encounter for antineoplastic immunotherapy 05/13/2018  . Gout   . Hemorrhoid   . Hyperlipidemia   . Hypertension     . Hypothyroidism (acquired) 08/11/2018  . 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 Obesity, unspecified 03/28/2009   Qualifier: Diagnosis of  By: [name], MD, [name]    . Palpitations 03/28/2009   Qualifier: Diagnosis of  By: [name], MD, [name]    . Right lower lobe lung mass 04/22/2018  . Skin cancer   . SNORING 03/28/2009   Qualifier: Diagnosis of  By: [name], MD, [name]    . Spinal stenosis   . 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[place]) 05/13/2018    ALLERGIES:  is allergic to pravastatin.  MEDICATIONS:  Current Outpatient Medications  Medication Sig Dispense Refill  . albuterol (PROVENTIL) (2.5 MG/3ML) 0.083% nebulizer solution Inhale into the lungs.    . Alirocumab (PRALUENT) 150 MG/ML SOAJ Inject 150 mg into the skin every 14 (fourteen) days. 2 pen 11  . Apple Cider Vinegar 500 MG TABS Take by mouth.    . Artificial Tear Solution (SOOTHE XP OP) Place 1 drop into both eyes 2 (two) times daily.    . carvedilol (COREG) 12.5 MG tablet Take 1 tablet (12.5 mg total) by mouth in the morning, at noon, and at bedtime. 270 tablet 3  . Cyanocobalamin (VITAMIN B 12) 250 MCG LOZG Take by mouth.    . diphenhydramine-acetaminophen (TYLENOL PM) 25-500  MG TABS tablet Take 1-2 tablets by mouth at bedtime as needed (pain).    . ferrous sulfate 324 MG TBEC Take 324 mg by mouth daily with breakfast.    . fluticasone (FLONASE) 50 MCG/ACT nasal spray Place 1 spray into both nostrils daily. 16 g 3  . levothyroxine (SYNTHROID) 150 MCG tablet TAKE 1 TABLET BY MOUTH EVERY DAY BEFORE BREAKFAST 30 tablet 0  . lidocaine-prilocaine (EMLA) cream Apply 1 application topically as needed. 30 g 1  . methocarbamol (ROBAXIN) 500 MG tablet Take 500 mg by mouth daily.    . Multiple Vitamins-Minerals (PRESERVISION AREDS 2 PO) Take by mouth.    . naproxen (NAPROSYN)  375 MG tablet Take 375 mg by mouth 2 (two) times daily.    Marland Kitchen oxyCODONE-acetaminophen (PERCOCET/ROXICET) 5-325 MG tablet Take 1 tablet by mouth every 8 (eight) hours as needed for severe pain. 30 tablet 0  . senna-docusate (SENNA S) 8.6-50 MG tablet 1 to 2 twice daily for constipation 120 tablet 5  . traZODone (DESYREL) 50 MG tablet TAKE 1 TO 2 TABLETS BY MOUTH AT BEDTIME AS NEEDED FOR SLEEP 180 tablet 0   No current facility-administered medications for this visit.    SURGICAL HISTORY:  Past Surgical History:  Procedure Laterality Date  . 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 BELPHAROPTOSIS REPAIR Bilateral   . CATARACT EXTRACTION W/ INTRAOCULAR LENS  IMPLANT, BILATERAL    . COLONOSCOPY N/A 11/03/2014   Procedure: COLONOSCOPY;  Surgeon: Rogene Houston, MD;  [facility];  Service: Endoscopy;  Laterality: N/A;  1225  . IR IMAGING GUIDED PORT INSERTION  07/11/2018  . KNEE CARTILAGE SURGERY Left    Left knee  . SKIN CANCER EXCISION  12/2014, 04/25/15  . VIDEO BRONCHOSCOPY Bilateral 05/09/2018   Procedure: VIDEO BRONCHOSCOPY WITH FLUORO;  Surgeon: Juanito Doom, MD;  Location: [facility];  Service: Cardiopulmonary;  Laterality: Bilateral;    REVIEW OF SYSTEMS:  A comprehensive review of systems was negative except for: Ears, nose, mouth, throat, and face: positive for nasal congestion   PHYSICAL EXAMINATION: General appearance: alert, cooperative and no distress Head: Normocephalic, without obvious abnormality, atraumatic Neck: no adenopathy, no JVD, supple, symmetrical, trachea midline and thyroid not enlarged, symmetric, no tenderness/mass/nodules Lymph nodes: Cervical, supraclavicular, and axillary nodes normal. Resp: clear to auscultation bilaterally Back: symmetric, no curvature. ROM normal. No CVA tenderness. Cardio: regular rate and rhythm, S1, S2 normal, no murmur, click, rub or gallop GI: soft, non-tender; bowel sounds normal; no masses,  no  organomegaly Extremities: extremities normal, atraumatic, no cyanosis or edema  ECOG PERFORMANCE STATUS: 1 - Symptomatic but completely ambulatory  Blood pressure (!) 143/90, pulse 71, temperature 97.7 F (36.5 C), temperature source Tympanic, resp. rate 18, height 6\' 2"  (1.88 m), weight 245 lb 4.8 oz (111.3 kg), SpO2 99 %.  LABORATORY DATA: Lab Results  Component Value Date   WBC 6.0 07/05/2020   HGB 12.5 (L) 07/05/2020   HCT 37.0 (L) 07/05/2020   MCV 93.9 07/05/2020   PLT 151 07/05/2020      Chemistry      Component Value Date/Time   NA 140 07/05/2020 1130   NA 144 04/09/2018 1507   K 4.4 07/05/2020 1130   CL 109 07/05/2020 1130   CO2 25 07/05/2020 1130   BUN 19 07/05/2020 1130   BUN 15 04/09/2018 1507   CREATININE 0.86 07/05/2020 1130      Component Value Date/Time   CALCIUM 9.1 07/05/2020 1130  ALKPHOS 69 07/05/2020 1130   AST 17 07/05/2020 1130   ALT 15 07/05/2020 1130   BILITOT 0.5 07/05/2020 1130       RADIOGRAPHIC STUDIES: No results found.  ASSESSMENT AND PLAN: This is a very pleasant 74 years old white male with very light most smoking history recently diagnosed with stage IV (T3, N0, M1c) non-small cell lung cancer, squamous cell carcinoma based on the biopsy from the left axillary mass. He status post 4 cycles of induction systemic chemotherapy with carboplatin, paclitaxel and Keytruda with partial response.  The patient is currently on maintenance treatment with single agent Keytruda status post 34 cycles. The patient continues to tolerate his treatment fairly well with no concerning adverse effects. I recommended for him to proceed with cycle #39 of his treatment today as planned. I will see the patient back for follow-up visit in 3 weeks for evaluation before the next cycle of his treatment. For the hypothyroidism, he will continue with the current dose of levothyroxine and will continue to monitor closely. He was advised to call immediately if he has  any concerning symptoms in the interval. The patient voices understanding of current disease status and treatment options and is in agreement with the current care plan. All questions were answered. The patient knows to call the clinic with any problems, questions or concerns. We can certainly see the patient much sooner if necessary.  Disclaimer: This note was dictated with voice recognition software. Similar sounding words can inadvertently be transcribed and may not be corrected upon review.

## 2020-07-05 NOTE — Patient Instructions (Signed)

## 2020-07-06 ENCOUNTER — Telehealth: Payer: Self-pay | Admitting: Internal Medicine

## 2020-07-06 NOTE — Telephone Encounter (Signed)
Scheduled per los. Called and left msg. Mailed printout  °

## 2020-07-20 ENCOUNTER — Telehealth: Payer: Self-pay

## 2020-07-20 ENCOUNTER — Ambulatory Visit: Payer: Medicare Other | Admitting: Physical Therapy

## 2020-07-20 ENCOUNTER — Encounter: Payer: Self-pay | Admitting: Family Medicine

## 2020-07-20 DIAGNOSIS — R059 Cough, unspecified: Secondary | ICD-10-CM | POA: Diagnosis not present

## 2020-07-20 DIAGNOSIS — R197 Diarrhea, unspecified: Secondary | ICD-10-CM | POA: Diagnosis not present

## 2020-07-20 DIAGNOSIS — R0602 Shortness of breath: Secondary | ICD-10-CM | POA: Diagnosis not present

## 2020-07-20 DIAGNOSIS — R0981 Nasal congestion: Secondary | ICD-10-CM | POA: Diagnosis not present

## 2020-07-20 DIAGNOSIS — J029 Acute pharyngitis, unspecified: Secondary | ICD-10-CM | POA: Diagnosis not present

## 2020-07-20 NOTE — Telephone Encounter (Signed)
Called and lmomed the pt stated that we needed him to call back and let us know if they were still interested in coming to pick up the sample

## 2020-07-20 NOTE — Telephone Encounter (Signed)
Called and spoke w/pt he stated that he already got samples of repatha and those we had set asided aren't needed because he hasn't even taken the one he picked up and that he will call us when he tried it to see if he can tolerate

## 2020-07-26 ENCOUNTER — Encounter: Payer: Self-pay | Admitting: Internal Medicine

## 2020-07-26 ENCOUNTER — Inpatient Hospital Stay: Payer: Medicare Other | Attending: Oncology | Admitting: Internal Medicine

## 2020-07-26 ENCOUNTER — Inpatient Hospital Stay: Payer: Medicare Other

## 2020-07-26 ENCOUNTER — Other Ambulatory Visit: Payer: Self-pay

## 2020-07-26 VITALS — BP 147/83 | HR 69 | Temp 97.9°F | Resp 18 | Ht 74.0 in | Wt 250.7 lb

## 2020-07-26 DIAGNOSIS — Z95828 Presence of other vascular implants and grafts: Secondary | ICD-10-CM

## 2020-07-26 DIAGNOSIS — C3491 Malignant neoplasm of unspecified part of right bronchus or lung: Secondary | ICD-10-CM | POA: Diagnosis not present

## 2020-07-26 DIAGNOSIS — E785 Hyperlipidemia, unspecified: Secondary | ICD-10-CM | POA: Diagnosis not present

## 2020-07-26 DIAGNOSIS — C3490 Malignant neoplasm of unspecified part of unspecified bronchus or lung: Secondary | ICD-10-CM

## 2020-07-26 DIAGNOSIS — I2510 Atherosclerotic heart disease of native coronary artery without angina pectoris: Secondary | ICD-10-CM | POA: Diagnosis not present

## 2020-07-26 DIAGNOSIS — C773 Secondary and unspecified malignant neoplasm of axilla and upper limb lymph nodes: Secondary | ICD-10-CM | POA: Insufficient documentation

## 2020-07-26 DIAGNOSIS — J329 Chronic sinusitis, unspecified: Secondary | ICD-10-CM | POA: Insufficient documentation

## 2020-07-26 DIAGNOSIS — Z5112 Encounter for antineoplastic immunotherapy: Secondary | ICD-10-CM | POA: Diagnosis not present

## 2020-07-26 DIAGNOSIS — C3401 Malignant neoplasm of right main bronchus: Secondary | ICD-10-CM | POA: Insufficient documentation

## 2020-07-26 DIAGNOSIS — M4800 Spinal stenosis, site unspecified: Secondary | ICD-10-CM | POA: Diagnosis not present

## 2020-07-26 DIAGNOSIS — E039 Hypothyroidism, unspecified: Secondary | ICD-10-CM

## 2020-07-26 DIAGNOSIS — C7801 Secondary malignant neoplasm of right lung: Secondary | ICD-10-CM

## 2020-07-26 DIAGNOSIS — I10 Essential (primary) hypertension: Secondary | ICD-10-CM | POA: Insufficient documentation

## 2020-07-26 DIAGNOSIS — E669 Obesity, unspecified: Secondary | ICD-10-CM | POA: Diagnosis not present

## 2020-07-26 DIAGNOSIS — Z79899 Other long term (current) drug therapy: Secondary | ICD-10-CM | POA: Diagnosis not present

## 2020-07-26 DIAGNOSIS — C7802 Secondary malignant neoplasm of left lung: Secondary | ICD-10-CM | POA: Diagnosis not present

## 2020-07-26 LAB — CMP (CANCER CENTER ONLY)
ALT: 12 U/L (ref 0–44)
AST: 16 U/L (ref 15–41)
Albumin: 3.8 g/dL (ref 3.5–5.0)
Alkaline Phosphatase: 60 U/L (ref 38–126)
Anion gap: 7 (ref 5–15)
BUN: 20 mg/dL (ref 8–23)
CO2: 29 mmol/L (ref 22–32)
Calcium: 9.1 mg/dL (ref 8.9–10.3)
Chloride: 106 mmol/L (ref 98–111)
Creatinine: 1.08 mg/dL (ref 0.61–1.24)
GFR, Estimated: >60 mL/min (ref >60)
Glucose, Bld: 98 mg/dL (ref 70–99)
Potassium: 4.6 mmol/L (ref 3.5–5.1)
Sodium: 142 mmol/L (ref 135–145)
Total Bilirubin: 0.6 mg/dL (ref 0.3–1.2)
Total Protein: 7.4 g/dL (ref 6.5–8.1)

## 2020-07-26 LAB — CBC WITH DIFFERENTIAL (CANCER CENTER ONLY)
Abs Immature Granulocytes: 0.11 10*3/uL — ABNORMAL HIGH (ref 0.00–0.07)
Basophils Absolute: 0.1 10*3/uL (ref 0.0–0.1)
Basophils Relative: 1 %
Eosinophils Absolute: 0.2 10*3/uL (ref 0.0–0.5)
Eosinophils Relative: 3 %
HCT: 39.4 % (ref 39.0–52.0)
Hemoglobin: 13.5 g/dL (ref 13.0–17.0)
Immature Granulocytes: 2 %
Lymphocytes Relative: 19 %
Lymphs Abs: 1.4 10*3/uL (ref 0.7–4.0)
MCH: 31.9 pg (ref 26.0–34.0)
MCHC: 34.3 g/dL (ref 30.0–36.0)
MCV: 93.1 fL (ref 80.0–100.0)
Monocytes Absolute: 0.9 10*3/uL (ref 0.1–1.0)
Monocytes Relative: 11 %
Neutro Abs: 4.9 10*3/uL (ref 1.7–7.7)
Neutrophils Relative %: 64 %
Platelet Count: 180 10*3/uL (ref 150–400)
RBC: 4.23 MIL/uL (ref 4.22–5.81)
RDW: 12.5 % (ref 11.5–15.5)
WBC Count: 7.5 10*3/uL (ref 4.0–10.5)
nRBC: 0 % (ref 0.0–0.2)

## 2020-07-26 LAB — TSH: TSH: 9.179 u[IU]/mL — ABNORMAL HIGH (ref 0.320–4.118)

## 2020-07-26 MED ORDER — SODIUM CHLORIDE 0.9 % IV SOLN
200.0000 mg | Freq: Once | INTRAVENOUS | Status: AC
  Administered 2020-07-26: 200 mg via INTRAVENOUS
  Filled 2020-07-26: qty 8

## 2020-07-26 MED ORDER — LEVOTHYROXINE SODIUM 175 MCG PO TABS: 175.0000 ug | ORAL_TABLET | Freq: Every day | ORAL | 1 refills | Status: DC

## 2020-07-26 MED ORDER — SODIUM CHLORIDE 0.9% FLUSH
10.0000 mL | Freq: Once | INTRAVENOUS | Status: AC
  Administered 2020-07-26: 10 mL
  Filled 2020-07-26: qty 10

## 2020-07-26 MED ORDER — SODIUM CHLORIDE 0.9 % IV SOLN
Freq: Once | INTRAVENOUS | Status: AC
  Filled 2020-07-26: qty 250

## 2020-07-26 MED ORDER — SODIUM CHLORIDE 0.9% FLUSH
10.0000 mL | INTRAVENOUS | Status: DC | PRN
  Administered 2020-07-26: 10 mL
  Filled 2020-07-26: qty 10

## 2020-07-26 MED ORDER — ALTEPLASE 2 MG IJ SOLR
2.0000 mg | Freq: Once | INTRAMUSCULAR | Status: AC
  Administered 2020-07-26: 2 mg
  Filled 2020-07-26: qty 2

## 2020-07-26 MED ORDER — INFLUENZA VAC A&B SA ADJ QUAD 0.5 ML IM PRSY
PREFILLED_SYRINGE | INTRAMUSCULAR | Status: AC
  Filled 2020-07-26: qty 0.5

## 2020-07-26 MED ORDER — ALTEPLASE 2 MG IJ SOLR
INTRAMUSCULAR | Status: AC
  Filled 2020-07-26: qty 2

## 2020-07-26 MED ORDER — HEPARIN SOD (PORK) LOCK FLUSH 100 UNIT/ML IV SOLN
500.0000 [IU] | Freq: Once | INTRAVENOUS | Status: AC | PRN
  Administered 2020-07-26: 500 [IU]
  Filled 2020-07-26: qty 5

## 2020-07-26 NOTE — Progress Notes (Signed)
No blood return from port. Cathflo administered by Larry Sierras.  Labs drawn peripherally.

## 2020-07-26 NOTE — Progress Notes (Signed)
[facility] [phone]) [phone]   Fax:(336) [phone]  OFFICE PROGRESS NOTE  Travis Norlander, DO [place] [address]  DIAGNOSIS: Stage IV (T3, N0, M1c) non-small cell lung cancer, squamous cell carcinoma presented with large right infrahilar mass in addition to left upper lobe lung nodule as well as left axillary mass with left axillary lymph node diagnosed in August 2019.  PRIOR THERAPY:  1) Palliative radiotherapy to the right infrahilar mass as well as the axillary mass under the care of Dr. Lisbeth Renshaw. 2) Systemic chemotherapy with carboplatin for AUC of 5, paclitaxel 175 mg/M2 and Keytruda 200 mg IV every 3 weeks status post 4 cycles.  CURRENT THERAPY: Maintenance immunotherapy with single agent Keytruda 200 mg IV every 3 weeks status post 35 cycles.  INTERVAL HISTORY: Travis Padilla 74 y.o. male returns to the clinic today for follow-up visit.  The patient is feeling fine today with no concerning complaints except for the persistent sinusitis.  He was treated with several courses of prednisone and antibiotics with minimal improvement.  He is scheduled to see his primary care physician next week for evaluation and referral to ENT if needed.  The patient denied having any current chest pain, shortness of breath, cough or hemoptysis.  He denied having any nausea, vomiting, diarrhea or constipation.  He denied having any fever or chills.  He has no headache or visual changes.  He is here today for evaluation before starting cycle #36 of his treatment.   MEDICAL HISTORY: Past Medical History:  Diagnosis Date  . Abnormal nuclear stress test    December, 2013  . Anemia   . Axillary adenopathy 04/24/2018  . CAD (coronary artery disease)    Mild nonobstructive plaque in cath 2013  . Chest pain    December, 2013  . Cough with hemoptysis 04/22/2018  . Dizziness   . Dyslipidemia 07/14/2019  . Encounter for antineoplastic immunotherapy 05/13/2018  . Gout    . Hemorrhoid   . Hyperlipidemia   . Hypertension   . Hypothyroidism (acquired) 08/11/2018  . 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 Obesity, unspecified 03/28/2009   Qualifier: Diagnosis of  By: [name], MD, [name]    . Palpitations 03/28/2009   Qualifier: Diagnosis of  By: [name], MD, [name]    . Right lower lobe lung mass 04/22/2018  . Skin cancer   . SNORING 03/28/2009   Qualifier: Diagnosis of  By: [name], MD, [name]    . Spinal stenosis   . 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[place]) 05/13/2018    ALLERGIES:  is allergic to pravastatin.  MEDICATIONS:  Current Outpatient Medications  Medication Sig Dispense Refill  . albuterol (PROVENTIL) (2.5 MG/3ML) 0.083% nebulizer solution Inhale into the lungs.    . Alirocumab (PRALUENT) 150 MG/ML SOAJ Inject 150 mg into the skin every 14 (fourteen) days. 2 pen 11  . Apple Cider Vinegar 500 MG TABS Take by mouth.    . Artificial Tear Solution (SOOTHE XP OP) Place 1 drop into both eyes 2 (two) times daily.    . carvedilol (COREG) 12.5 MG tablet Take 1 tablet (12.5 mg total) by mouth in the morning, at noon, and at bedtime. 270 tablet 3  . Cyanocobalamin (VITAMIN B 12) 250 MCG LOZG Take by mouth.    Marland Kitchen  diphenhydramine-acetaminophen (TYLENOL PM) 25-500 MG TABS tablet Take 1-2 tablets by mouth at bedtime as needed (pain).    . ferrous sulfate 324 MG TBEC Take 324 mg by mouth daily with breakfast.    . fluticasone (FLONASE) 50 MCG/ACT nasal spray Place 1 spray into both nostrils daily. 16 g 3  . levothyroxine (SYNTHROID) 150 MCG tablet TAKE 1 TABLET BY MOUTH EVERY DAY BEFORE BREAKFAST 30 tablet 0  . lidocaine-prilocaine (EMLA) cream Apply 1 application topically as needed. 30 g 1  . methocarbamol (ROBAXIN) 500 MG tablet Take 500 mg by mouth daily.    . Multiple Vitamins-Minerals (PRESERVISION  AREDS 2 PO) Take by mouth.    . naproxen (NAPROSYN) 375 MG tablet Take 375 mg by mouth 2 (two) times daily.    Marland Kitchen oxyCODONE-acetaminophen (PERCOCET/ROXICET) 5-325 MG tablet Take 1 tablet by mouth every 8 (eight) hours as needed for severe pain. 30 tablet 0  . senna-docusate (SENNA S) 8.6-50 MG tablet 1 to 2 twice daily for constipation 120 tablet 5  . traZODone (DESYREL) 50 MG tablet TAKE 1 TO 2 TABLETS BY MOUTH AT BEDTIME AS NEEDED FOR SLEEP 180 tablet 0   No current facility-administered medications for this visit.    SURGICAL HISTORY:  Past Surgical History:  Procedure Laterality Date  . 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 BELPHAROPTOSIS REPAIR Bilateral   . CATARACT EXTRACTION W/ INTRAOCULAR LENS  IMPLANT, BILATERAL    . COLONOSCOPY N/A 11/03/2014   Procedure: COLONOSCOPY;  Surgeon: Rogene Houston, MD;  [facility];  Service: Endoscopy;  Laterality: N/A;  1225  . IR IMAGING GUIDED PORT INSERTION  07/11/2018  . KNEE CARTILAGE SURGERY Left    Left knee  . SKIN CANCER EXCISION  12/2014, 04/25/15  . VIDEO BRONCHOSCOPY Bilateral 05/09/2018   Procedure: VIDEO BRONCHOSCOPY WITH FLUORO;  Surgeon: Juanito Doom, MD;  [facility];  Service: Cardiopulmonary;  Laterality: Bilateral;    REVIEW OF SYSTEMS:  A comprehensive review of systems was negative except for: Ears, nose, mouth, throat, and face: positive for nasal congestion   PHYSICAL EXAMINATION: General appearance: alert, cooperative, fatigued and no distress Head: Normocephalic, without obvious abnormality, atraumatic Neck: no adenopathy, no JVD, supple, symmetrical, trachea midline and thyroid not enlarged, symmetric, no tenderness/mass/nodules Lymph nodes: Cervical, supraclavicular, and axillary nodes normal. Resp: clear to auscultation bilaterally Back: symmetric, no curvature. ROM normal. No CVA tenderness. Cardio: regular rate and rhythm, S1, S2 normal, no murmur, click, rub or gallop GI: soft,  non-tender; bowel sounds normal; no masses,  no organomegaly Extremities: extremities normal, atraumatic, no cyanosis or edema  ECOG PERFORMANCE STATUS: 1 - Symptomatic but completely ambulatory  Blood pressure (!) 147/83, pulse 69, temperature 97.9 F (36.6 C), temperature source Tympanic, resp. rate 18, height 6\' 2"  (1.88 m), weight 250 lb 11.2 oz (113.7 kg), SpO2 98 %.  LABORATORY DATA: Lab Results  Component Value Date   WBC 7.5 07/26/2020   HGB 13.5 07/26/2020   HCT 39.4 07/26/2020   MCV 93.1 07/26/2020   PLT 180 07/26/2020      Chemistry      Component Value Date/Time   NA 142 07/26/2020 0926   NA 144 04/09/2018 1507   K 4.6 07/26/2020 0926   CL 106 07/26/2020 0926   CO2 29 07/26/2020 0926   BUN 20 07/26/2020 0926   BUN 15 04/09/2018 1507   CREATININE 1.08 07/26/2020 0926      Component Value Date/Time   CALCIUM  9.1 07/26/2020 0926   ALKPHOS 60 07/26/2020 0926   AST 16 07/26/2020 0926   ALT 12 07/26/2020 0926   BILITOT 0.6 07/26/2020 0926       RADIOGRAPHIC STUDIES: No results found.  ASSESSMENT AND PLAN: This is a very pleasant 74 years old white male with very light most smoking history recently diagnosed with stage IV (T3, N0, M1c) non-small cell lung cancer, squamous cell carcinoma based on the biopsy from the left axillary mass. He status post 4 cycles of induction systemic chemotherapy with carboplatin, paclitaxel and Keytruda with partial response.  The patient is currently on maintenance treatment with single agent Keytruda status post 35 cycles. The patient continues to tolerate his treatment well with no concerning adverse effects but he has persistent sinusitis and was treated with antibiotic and prednisone. I recommended for him to proceed with cycle #36 today as planned. I will see him back for follow-up visit in 3 weeks for evaluation with repeat CT scan of the chest, abdomen pelvis for restaging of his disease. For the hypothyroidism, I will  increase his dose of levothyroxine to 175 mcg p.o. daily because of the elevated TSH. The patient was advised to call immediately if he has any concerning symptoms in the interval. The patient voices understanding of current disease status and treatment options and is in agreement with the current care plan. All questions were answered. The patient knows to call the clinic with any problems, questions or concerns. We can certainly see the patient much sooner if necessary.  Disclaimer: This note was dictated with voice recognition software. Similar sounding words can inadvertently be transcribed and may not be corrected upon review.

## 2020-07-26 NOTE — Patient Instructions (Signed)
[facility] Discharge Instructions for Patients Receiving Chemotherapy  Today you received the following chemotherapy agents:  Keytruda.  To help prevent nausea and vomiting after your treatment, we encourage you to take your nausea medication as directed.   If you develop nausea and vomiting that is not controlled by your nausea medication, call the clinic.   BELOW ARE SYMPTOMS THAT SHOULD BE REPORTED IMMEDIATELY:  *FEVER GREATER THAN 100.5 F  *CHILLS WITH OR WITHOUT FEVER  NAUSEA AND VOMITING THAT IS NOT CONTROLLED WITH YOUR NAUSEA MEDICATION  *UNUSUAL SHORTNESS OF BREATH  *UNUSUAL BRUISING OR BLEEDING  TENDERNESS IN MOUTH AND THROAT WITH OR WITHOUT PRESENCE OF ULCERS  *URINARY PROBLEMS  *BOWEL PROBLEMS  UNUSUAL RASH Items with * indicate a potential emergency and should be followed up as soon as possible.  Feel free to call the clinic should you have any questions or concerns. The clinic phone number is [phone].  Please show the CHEMO ALERT CARD at check-in to the Emergency Department and triage nurse.    

## 2020-07-27 ENCOUNTER — Ambulatory Visit (INDEPENDENT_AMBULATORY_CARE_PROVIDER_SITE_OTHER): Payer: Medicare Other | Admitting: Family Medicine

## 2020-07-27 ENCOUNTER — Encounter: Payer: Self-pay | Admitting: Physical Therapy

## 2020-07-27 ENCOUNTER — Encounter: Payer: Self-pay | Admitting: Family Medicine

## 2020-07-27 ENCOUNTER — Ambulatory Visit: Payer: Medicare Other | Attending: Sports Medicine | Admitting: Physical Therapy

## 2020-07-27 VITALS — BP 155/91 | HR 75 | Temp 97.2°F | Ht 74.0 in | Wt 248.0 lb

## 2020-07-27 DIAGNOSIS — J310 Chronic rhinitis: Secondary | ICD-10-CM | POA: Diagnosis not present

## 2020-07-27 DIAGNOSIS — M6281 Muscle weakness (generalized): Secondary | ICD-10-CM | POA: Diagnosis not present

## 2020-07-27 DIAGNOSIS — M25571 Pain in right ankle and joints of right foot: Secondary | ICD-10-CM | POA: Diagnosis not present

## 2020-07-27 DIAGNOSIS — M25671 Stiffness of right ankle, not elsewhere classified: Secondary | ICD-10-CM | POA: Diagnosis not present

## 2020-07-27 DIAGNOSIS — J329 Chronic sinusitis, unspecified: Secondary | ICD-10-CM

## 2020-07-27 DIAGNOSIS — R262 Difficulty in walking, not elsewhere classified: Secondary | ICD-10-CM | POA: Diagnosis not present

## 2020-07-27 MED ORDER — LEVOFLOXACIN 500 MG PO TABS: 500.0000 mg | ORAL_TABLET | Freq: Every day | ORAL | 0 refills | Status: AC

## 2020-07-27 MED ORDER — AZELASTINE HCL 0.1 % NA SOLN: 1.0000 | Freq: Two times a day (BID) | NASAL | 12 refills | Status: DC

## 2020-07-27 NOTE — Patient Instructions (Signed)
Allegra or Zyrtec for antihistamine  Astelin nasal spray added  Use Flonase first, after about 10 minutes follow with Astelin. May use sinus rinse about 1 hours following these if needed.  Levaquin given if symptoms worsen/ do not improve in next 72 hours.   Sinusitis, Adult Sinusitis is soreness and swelling (inflammation) of your sinuses. Sinuses are hollow spaces in the bones around your face. They are located:  Around your eyes.  In the middle of your forehead.  Behind your nose.  In your cheekbones. Your sinuses and nasal passages are lined with a fluid called mucus. Mucus drains out of your sinuses. Swelling can trap mucus in your sinuses. This lets germs (bacteria, virus, or fungus) grow, which leads to infection. Most of the time, this condition is caused by a virus. What are the causes? This condition is caused by:  Allergies.  Asthma.  Germs.  Things that block your nose or sinuses.  Growths in the nose (nasal polyps).  Chemicals or irritants in the air.  Fungus (rare). What increases the risk? You are more likely to develop this condition if:  You have a weak body defense system (immune system).  You do a lot of swimming or diving.  You use nasal sprays too much.  You smoke. What are the signs or symptoms? The main symptoms of this condition are pain and a feeling of pressure around the sinuses. Other symptoms include:  Stuffy nose (congestion).  Runny nose (drainage).  Swelling and warmth in the sinuses.  Headache.  Toothache.  A cough that may get worse at night.  Mucus that collects in the throat or the back of the nose (postnasal drip).  Being unable to smell and taste.  Being very tired (fatigue).  A fever.  Sore throat.  Bad breath. How is this diagnosed? This condition is diagnosed based on:  Your symptoms.  Your medical history.  A physical exam.  Tests to find out if your condition is short-term (acute) or long-term  (chronic). Your doctor may: ? Check your nose for growths (polyps). ? Check your sinuses using a tool that has a light (endoscope). ? Check for allergies or germs. ? Do imaging tests, such as an MRI or CT scan. How is this treated? Treatment for this condition depends on the cause and whether it is short-term or long-term.  If caused by a virus, your symptoms should go away on their own within 10 days. You may be given medicines to relieve symptoms. They include: ? Medicines that shrink swollen tissue in the nose. ? Medicines that treat allergies (antihistamines). ? A spray that treats swelling of the nostrils. ? Rinses that help get rid of thick mucus in your nose (nasal saline washes).  If caused by bacteria, your doctor may wait to see if you will get better without treatment. You may be given antibiotic medicine if you have: ? A very bad infection. ? A weak body defense system.  If caused by growths in the nose, you may need to have surgery. Follow these instructions at home: Medicines  Take, use, or apply over-the-counter and prescription medicines only as told by your doctor. These may include nasal sprays.  If you were prescribed an antibiotic medicine, take it as told by your doctor. Do not stop taking the antibiotic even if you start to feel better. Hydrate and humidify   Drink enough water to keep your pee (urine) pale yellow.  Use a cool mist humidifier to keep the humidity  level in your home above 50%.  Breathe in steam for 10-15 minutes, 3-4 times a day, or as told by your doctor. You can do this in the bathroom while a hot shower is running.  Try not to spend time in cool or dry air. Rest  Rest as much as you can.  Sleep with your head raised (elevated).  Make sure you get enough sleep each night. General instructions   Put a warm, moist washcloth on your face 3-4 times a day, or as often as told by your doctor. This will help with discomfort.  Wash your  hands often with soap and water. If there is no soap and water, use hand sanitizer.  Do not smoke. Avoid being around people who are smoking (secondhand smoke).  Keep all follow-up visits as told by your doctor. This is important. Contact a doctor if:  You have a fever.  Your symptoms get worse.  Your symptoms do not get better within 10 days. Get help right away if:  You have a very bad headache.  You cannot stop throwing up (vomiting).  You have very bad pain or swelling around your face or eyes.  You have trouble seeing.  You feel confused.  Your neck is stiff.  You have trouble breathing. Summary  Sinusitis is swelling of your sinuses. Sinuses are hollow spaces in the bones around your face.  This condition is caused by tissues in your nose that become inflamed or swollen. This traps germs. These can lead to infection.  If you were prescribed an antibiotic medicine, take it as told by your doctor. Do not stop taking it even if you start to feel better.  Keep all follow-up visits as told by your doctor. This is important. This information is not intended to replace advice given to you by your health care provider. Make sure you discuss any questions you have with your health care provider. Document Revised: 01/27/2018 Document Reviewed: 01/27/2018 Elsevier Patient Education  [place].

## 2020-07-27 NOTE — Therapy (Addendum)
[facility] [place], [place], [address] Phone: [phone]   Fax:  [phone]  Physical Therapy Treatment PHYSICAL THERAPY DISCHARGE SUMMARY  Visits from Start of Care: 2  Current functional level related to goals / functional outcomes: See below   Remaining deficits: See goals   Education / Equipment: HEP Plan: Patient agrees to discharge.  Patient goals were partially met. Patient is being discharged due to not returning since the last visit.  ?????     Patient Details  Name: Travis Padilla MRN: [id number] Date of Birth: 12-20-1945 Referring Provider (PT): Vickki Hearing, MD   Encounter Date: 07/27/2020   PT End of Session - 07/27/20 1129    Visit Number 2    Number of Visits 8    Date for PT Re-Evaluation 08/11/20    Authorization Type Medicare; Progress note every 10th visit, KX modifer at 15th visit.    PT Start Time 1127    PT Stop Time 1202   limited by late arrival   PT Time Calculation (min) 35 min    Activity Tolerance Patient tolerated treatment well    Behavior During Therapy [facility] for tasks assessed/performed           Past Medical History:  Diagnosis Date  . Abnormal nuclear stress test    December, 2013  . Anemia   . Axillary adenopathy 04/24/2018  . CAD (coronary artery disease)    Mild nonobstructive plaque in cath 2013  . Chest pain    December, 2013  . Cough with hemoptysis 04/22/2018  . Dizziness   . Dyslipidemia 07/14/2019  . Encounter for antineoplastic immunotherapy 05/13/2018  . Gout   . Hemorrhoid   . Hyperlipidemia   . Hypertension   . Hypothyroidism (acquired) 08/11/2018  . 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 Obesity, unspecified 03/28/2009   Qualifier: Diagnosis of  By: [name], MD, [name]    . Palpitations 03/28/2009   Qualifier:  Diagnosis of  By: [name], MD, [name]    . Right lower lobe lung mass 04/22/2018  . Skin cancer   . SNORING 03/28/2009   Qualifier: Diagnosis of  By: [name], MD, [name]    . Spinal stenosis   . 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 BELPHAROPTOSIS REPAIR Bilateral   . CATARACT EXTRACTION W/ INTRAOCULAR LENS  IMPLANT, BILATERAL    . COLONOSCOPY N/A 11/03/2014   Procedure: COLONOSCOPY;  Surgeon: Rogene Houston, MD;  Location: [facility];  Service: Endoscopy;  Laterality: N/A;  1225  . IR IMAGING GUIDED PORT INSERTION  07/11/2018  . KNEE CARTILAGE SURGERY Left    Left knee  . SKIN CANCER EXCISION  12/2014, 04/25/15  . VIDEO BRONCHOSCOPY Bilateral 05/09/2018   Procedure: VIDEO BRONCHOSCOPY WITH FLUORO;  Surgeon: Juanito Doom, MD;  Location: [facility];  Service: Cardiopulmonary;  Laterality: Bilateral;    There were no vitals filed for this visit.   Subjective Assessment - 07/27/20 1128    Subjective COVID 19 screening performed on patient upon arrival. Patient denies any pain while walking and feels like the HEP has helped.    Pertinent History Neuropathy, stage 4 lung cancer, CAD, Sinusitis  Limitations Walking;House hold activities;Standing    Diagnostic tests x-ray: arthritis, previous hairline fracture    Patient Stated Goals improve walking    Currently in Pain? No/denies              [facility] PT Assessment - 07/27/20 0001      Assessment   Medical Diagnosis Pain in right foot    Referring Provider (PT) Vickki Hearing, MD    Next MD Visit not made yet    Prior Therapy not for foot      Precautions   Precautions None      Restrictions   Weight Bearing Restrictions No                         [facility] Adult PT Treatment/Exercise - 07/27/20 0001      Exercises   Exercises Ankle      Ankle Exercises: Aerobic   Nustep  L4, seat 11 x10 min      Ankle Exercises: Standing   Rocker Board 3 minutes    Heel Raises Both;20 reps    Toe Raise 20 reps    Other Standing Ankle Exercises R forward lunge for ROM x15 reps off 6" step    Other Standing Ankle Exercises R ankle dynadisc Df/Pf x2 min, Inv/Ev x2 min, circles x2 min      Ankle Exercises: Seated   ABC's 1 rep    BAPS Sitting;Level 2;15 reps    BAPS Limitations DF/PF, Circles     Other Seated Ankle Exercises R ankle prostretch x2 min                       PT Long Term Goals - 06/30/20 1730      PT LONG TERM GOAL #1   Title Patient will be independent with HEP    Time 4    Period Weeks    Status Achieved      PT LONG TERM GOAL #2   Title Patient will demonstrate 8+ degrees of right ankle DF to improve gait mechanics.    Time 4    Period Weeks      PT LONG TERM GOAL #3   Title Patient will demonstrate 4+/5 or greater right ankle MMT in all planes to improve stability during functional tasks.    Time 4    Period Weeks    Status New      PT LONG TERM GOAL #4   Title Patient will report ability to walk for 20 mins for exercise routine with right ankle pain less than or equal to 3/10    Baseline --    Time 4    Period Weeks    Status New      PT LONG TERM GOAL #5   Title Patient will report ability to perform ADLs and home activities with right ankle pain less than or equal to 2/10.    Time 4    Period Weeks    Status New                 Plan - 07/27/20 1205    Clinical Impression Statement Patient presented in clinic with reports of no R ankle pain. Patient ambulated with improved weightshift to RLE. Patient progressed through more ROM and strengthening exercises with intermittant reports of lateral ankle discomfort with ankle DF. Patient compliant with HEP and has noticed improvement since begnning HEP. No R ankle swelling reported by patient.  Personal Factors and Comorbidities Age;Comorbidity 2;Time since onset of  injury/illness/exacerbation    Comorbidities neuropathy, stage 4 lung cancer, CAD, Sinusitis    Examination-Activity Limitations Locomotion Level;Transfers;Stand    Stability/Clinical Decision Making Stable/Uncomplicated    Rehab Potential Good    PT Frequency 2x / week    PT Duration 4 weeks    PT Treatment/Interventions ADLs/Self Care Home Management;Cryotherapy;Electrical Stimulation;Moist Heat;Ultrasound;Stair training;Gait training;Functional mobility training;Therapeutic activities;Therapeutic exercise;Balance training;Neuromuscular re-education;Manual techniques;Passive range of motion;Patient/family education;Vasopneumatic Device;Taping    PT Next Visit Plan nustep, ankle strengthening, standing balance activities, e-stim for pain as needed.    PT Home Exercise Plan see patient education section    Consulted and Agree with Plan of Care Patient           Patient will benefit from skilled therapeutic intervention in order to improve the following deficits and impairments:  Abnormal gait, Difficulty walking, Decreased range of motion, Decreased activity tolerance, Decreased balance, Decreased strength, Pain  Visit Diagnosis: Pain in right ankle and joints of right foot  Stiffness of right ankle, not elsewhere classified  Muscle weakness (generalized)  Difficulty in walking, not elsewhere classified     Problem List Patient Active Problem List   Diagnosis Date Noted  . 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 Itching 11/17/2019  . Dyslipidemia 07/14/2019  . Shoulder pain 03/31/2019  . Port-A-Cath in place 08/11/2018  . Hypothyroidism (acquired) 08/11/2018  . 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 Encounter for antineoplastic chemotherapy 05/13/2018  . Encounter for antineoplastic immunotherapy 05/13/2018  . Goals of care, counseling/discussion 05/13/2018  . 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  Axillary adenopathy 04/24/2018  . Right lower lobe lung mass 04/22/2018  . Cough with hemoptysis 04/22/2018  . Wheezing 04/02/2018  . Long-term use of high-risk medication 04/02/2018  . Dizziness 06/26/2017  . Fatigue 04/30/2016  . Insomnia 04/30/2016  . Sinusitis 01/31/2016  . Hyperlipidemia 01/31/2016  . Ear pain 05/09/2015  . Anemia   . Abnormal nuclear stress test   . OBESITY, UNSPECIFIED 03/28/2009  . ESSENTIAL HYPERTENSION, BENIGN 03/28/2009  . PALPITATIONS 03/28/2009  . SNORING 03/28/2009    Standley Brooking, PTA 07/27/2020, 12:13 PM  [facility] [address] [place], [place], [address] Phone: [phone]   Fax:  [phone]  Name: Travis Padilla MRN: [id number] Date of Birth: 20-Nov-1945

## 2020-07-27 NOTE — Progress Notes (Signed)
Subjective: CC: Follow-up sinusitis PCP: [name], DO TDV:VOHY Travis Padilla is a 74 y.o. male presenting to clinic today for:  1.  Sinusitis Patient has been seen 3 times now in urgent care and once by his pulmonologist with regards to sinus pressure, cough, chest congestion and postnasal drip.  He has been treated with doxycycline, clarithromycin, prednisone and Flonase but symptoms are not improving.  He is used OTC Afrin in efforts to open up his sinuses as well as attempted sinus rinses but he is not even able to get the sinus rinse in.  Denies any fevers, hemoptysis.  His phlegm sometimes is clear and sometimes has a green tinge to it.  He thinks he might want to see an ear nose and throat doctor.  He has tested Covid negative.  Additionally, he is vaccinated against COVID-19.  Medical history significant for lung cancer and he is currently on immunosuppression therapy.   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) [facility]) dx'd 04/17/18   to LN, infrahilar mass, lung nodule and lt axilla   Obesity, unspecified 03/28/2009   Qualifier: Diagnosis of  By: [name], MD, [name]     Palpitations 03/28/2009   Qualifier: Diagnosis of  By: [name], MD, [name]     Right lower lobe lung mass 04/22/2018   Skin cancer    SNORING 03/28/2009   Qualifier: Diagnosis of   By: [name], MD, [name]     Spinal stenosis    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    Current Outpatient Medications:    albuterol (PROVENTIL) (2.5 MG/3ML) 0.083% nebulizer solution, Inhale into the lungs., Disp: , Rfl:    Alirocumab (PRALUENT) 150 MG/ML SOAJ, Inject 150 mg into the skin every 14 (fourteen) days., Disp: 2 pen, Rfl: 11   Apple Cider Vinegar 500 MG TABS, Take by mouth., Disp: , Rfl:    Artificial Tear Solution (SOOTHE XP OP), Place 1 drop into both eyes 2 (two) times daily., Disp: , Rfl:    carvedilol (COREG) 12.5 MG tablet, Take 1 tablet (12.5 mg total) by mouth in the morning, at noon, and at bedtime., Disp: 270 tablet, Rfl: 3   Cyanocobalamin (VITAMIN B 12) 250 MCG LOZG, Take by mouth., Disp: , Rfl:    diphenhydramine-acetaminophen (TYLENOL PM) 25-500 MG TABS tablet, Take 1-2 tablets by mouth at bedtime as needed (pain)., Disp: , Rfl:    doxycycline (VIBRA-TABS) 100 MG tablet, Take 100 mg by mouth 2 (two) times daily., Disp: , Rfl:    ferrous sulfate 324 MG TBEC, Take 324 mg by mouth daily with breakfast., Disp: , Rfl:    fluticasone (FLONASE) 50 MCG/ACT nasal spray, Place 1 spray into both nostrils daily., Disp: 16 g, Rfl: 3  levothyroxine (SYNTHROID) 175 MCG tablet, Take 1 tablet (175 mcg total) by mouth daily before breakfast., Disp: 30 tablet, Rfl: 1   lidocaine-prilocaine (EMLA) cream, Apply 1 application topically as needed., Disp: 30 g, Rfl: 1   methocarbamol (ROBAXIN) 500 MG tablet, Take 500 mg by mouth daily., Disp: , Rfl:    Multiple Vitamins-Minerals (PRESERVISION AREDS 2 PO), Take by mouth., Disp: , Rfl:    naproxen (NAPROSYN) 375 MG tablet, Take 375 mg by mouth 2 (two) times daily., Disp: , Rfl:    oxyCODONE-acetaminophen (PERCOCET/ROXICET) 5-325 MG tablet, Take 1 tablet by mouth every 8 (eight) hours as needed for severe pain., Disp: 30 tablet, Rfl: 0   predniSONE (DELTASONE) 5 MG tablet, Take by mouth., Disp: ,  Rfl:    senna-docusate (SENNA S) 8.6-50 MG tablet, 1 to 2 twice daily for constipation, Disp: 120 tablet, Rfl: 5   traZODone (DESYREL) 50 MG tablet, TAKE 1 TO 2 TABLETS BY MOUTH AT BEDTIME AS NEEDED FOR SLEEP, Disp: 180 tablet, Rfl: 0 Social History   Socioeconomic History   Marital status: Married    Spouse name: Not on file   Number of children: 1   Years of education: Not on file   Highest education level: Not on file  Occupational History   Occupation: Government social research officer  Tobacco Use   Smoking status: Former Smoker    Packs/day: 0.20    Years: 2.00    Pack years: 0.40    Types: Cigarettes    Start date: 10/01/1968   Smokeless tobacco: Never Used  Vaping Use   Vaping Use: Never used  Substance and Sexual Activity   Alcohol use: No    Comment: hx of    Drug use: No   Sexual activity: Not on file  Other Topics Concern   Not on file  Social History Narrative   Unable to ask intimate partner violence questions, wife present   Social Determinants of Health   Financial Resource Strain:    Difficulty of Paying Living Expenses: Not on file  Food Insecurity:    Worried About Charity fundraiser in the Last Year: Not on file   [employer] of Food in the Last Year: Not on file  Transportation Needs:    Lack of Transportation (Medical): Not on file   Lack of Transportation (Non-Medical): Not on file  Physical Activity:    Days of Exercise per Week: Not on file   Minutes of Exercise per Session: Not on file  Stress:    Feeling of Stress : Not on file  Social Connections:    Frequency of Communication with Friends and Family: Not on file   Frequency of Social Gatherings with Friends and Family: Not on file   Attends Religious Services: Not on file   Active Member of Clubs or Organizations: Not on file   Attends Archivist Meetings: Not on file   Marital Status: Not on file  Intimate Partner Violence:    Fear of Current or Ex-Partner: Not on  file   Emotionally Abused: Not on file   Physically Abused: Not on file   Sexually Abused: Not on file   Family History  Problem Relation Age of Onset   Heart attack Father    Heart failure Mother    Parkinson's disease Mother    Healthy Sister    Skin cancer Sister    Neuropathy Brother    COPD Brother    Epilepsy Brother    Healthy Sister  Healthy Sister    Colon cancer Neg Hx     Objective: Office vital signs reviewed. BP (!) 155/91    Pulse 75    Temp (!) 97.2 F (36.2 C) (Temporal)    Ht 6\' 2"  (1.88 m)    Wt 248 lb (112.5 kg)    SpO2 97%    BMI 31.84 kg/m   Physical Examination:  General: Awake, alert, well nourished, No acute distress HEENT: Normal; TMs intact bilaterally.  No tenderness palpation to the frontal or maxillary sinuses.  He has some opaque drainage noted in the right nare with a hemostatic bleed.  Nares are edematous and erythematous. Cardio: regular rate and rhythm, S1S2 heard, no murmurs appreciated Pulm: clear to auscultation bilaterally, no wheezes, rhonchi or rales; normal work of breathing on room air.  Coughing intermittently during exam  Assessment/ Plan: 74 y.o. male   1. Rhinosinusitis I reviewed his urgent care notes and pulmonology notes.  I would like him to proceed with Astelin.  We discussed use of Flonase first followed by Astelin about 10 minutes later.  After about an hour he may proceed with sinus rinses if he desires.  Advised to use Allegra or Zyrtec for antihistamine.  He has been through a couple of antibiotics and I doubt he has an ongoing bacterial infection but am going to give him a pocket prescription for Levaquin should symptoms worsen over the weekend.  I placed a referral to Dr. Constance Holster in [facility] for further evaluation and management if needed.  Patient understands red flag signs and symptoms warranting further evaluation.  He will follow-up as needed - levofloxacin (LEVAQUIN) 500 MG tablet; Take 1 tablet (500  mg total) by mouth daily for 5 days.  Dispense: 5 tablet; Refill: 0 - azelastine (ASTELIN) 0.1 % nasal spray; Place 1 spray into both nostrils 2 (two) times daily.  Dispense: 30 mL; Refill: 12 - Ambulatory referral to ENT   No orders of the defined types were placed in this encounter.  No orders of the defined types were placed in this encounter.    [name], DO [place] [phone]

## 2020-08-07 ENCOUNTER — Encounter: Payer: Self-pay | Admitting: Family Medicine

## 2020-08-09 ENCOUNTER — Ambulatory Visit: Payer: Medicare Other | Admitting: Physical Therapy

## 2020-08-11 NOTE — Progress Notes (Signed)
Travis Padilla OFFICE PROGRESS NOTE  Travis Norlander, DO [place] [address]  DIAGNOSIS: Stage IV (T3, N0, M1c) non-small cell lung cancer, squamous cell carcinoma presented with large right infrahilar mass in addition to left upper lobe lung nodule as well as left axillary mass with left axillary lymph node diagnosed in August 2019.  PRIOR THERAPY: 1) Palliative radiotherapy to the right infrahilar mass as well as the axillary mass under the care of Dr. Lisbeth Padilla.  CURRENT THERAPY:  Systemic chemotherapy with carboplatin for AUC of 5, paclitaxel 175 mg/M2 and Keytruda 200 mg IV every 3 weeks. Status post 40 cycles. Starting from cycle #5, the patient has been on maintenance immunotherapy with single agent Keytruda 200 mg IV every 3 weeks.  INTERVAL HISTORY: Travis Padilla 74 y.o. male returns to the clinic for a follow up visit. The patient has a few concerns today.  First, the patient was seen in the emergency room on 08/14/2020 for the chief complaint of right lower quadrant abdominal pain.  The patient thought he was constipated and tried using Dulcolax, prune juice, and enema.  He then presented to an urgent care who encouraged him to use magnesium citrate. After little improvement with the magnesium citrate, the patient went to the emergency room.  He had a urinalysis performed which noted blood in his urine as well as mucus.  The patient's symptoms improved after having a bowel movement and he returned home.  His abdominal pain had subsided; however, he started having bilateral lower back pain which started yesterday.  The patient has a history of chronic back pain due to degenerative disc disease but the patient states that this pain feels different.  The patient has also been endorsing sinusitis for several months and postnasal drainage. The patient has been following with his PCP. He was recently referred to ENT and has an appointment with them later today.   The  patient continues to tolerate treatment with immunotherapy with Keytruda well without any adverse effects. Denies any fever, night sweats, or weight loss. Denies any chest pain or hemoptysis.  He believes his breathing is a little bit more labored over the course of the last few weeks.  The patient has a cough which he attributes to postnasal drainage.  Denies any nausea orvomiting.He denies headaches. Denies associated changes with his balance, seizures, or new visual symptoms. He has baseline visual issues for which he has implants. The patient recently had a restaging CT scan performed.  The patient noted itching and rash on his bilateral upper neck/shoulders after having his CT scan performed yesterday.  His symptoms have resolved this morning.  The patient is here today for evaluation prior to starting cycle #41 of [place]  MEDICAL HISTORY: Past Medical History:  Diagnosis Date  . Abnormal nuclear stress test    December, 2013  . Anemia   . Axillary adenopathy 04/24/2018  . CAD (coronary artery disease)    Mild nonobstructive plaque in cath 2013  . Chest pain    December, 2013  . Cough with hemoptysis 04/22/2018  . Dizziness   . Dyslipidemia 07/14/2019  . Encounter for antineoplastic immunotherapy 05/13/2018  . Gout   . Hemorrhoid   . Hyperlipidemia   . Hypertension   . Hypothyroidism (acquired) 08/11/2018  . 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  Obesity, unspecified 03/28/2009   Qualifier: Diagnosis of  By: Travis Spanish, MD, [name]    . Palpitations 03/28/2009   Qualifier: Diagnosis of  By: Travis Spanish, MD, [name]    . Right lower lobe lung mass 04/22/2018  . Skin cancer   . SNORING 03/28/2009   Qualifier: Diagnosis of  By: Travis Spanish, MD, [name]    . Spinal stenosis   . 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[place]) 05/13/2018     ALLERGIES:  is allergic to pravastatin and iodine.  MEDICATIONS:  Current Outpatient Medications  Medication Sig Dispense Refill  . albuterol (PROVENTIL) (2.5 MG/3ML) 0.083% nebulizer solution Inhale into the lungs.    . Alirocumab (PRALUENT) 150 MG/ML SOAJ Inject 150 mg into the skin every 14 (fourteen) days. 2 pen 11  . Artificial Tear Solution (SOOTHE XP OP) Place 1 drop into both eyes 2 (two) times daily.    Marland Kitchen azelastine (ASTELIN) 0.1 % nasal spray Place 1 spray into both nostrils 2 (two) times daily. 30 mL 12  . carvedilol (COREG) 12.5 MG tablet Take 1 tablet (12.5 mg total) by mouth in the morning, at noon, and at bedtime. 270 tablet 3  . Cyanocobalamin (VITAMIN B 12) 250 MCG LOZG Take by mouth.    . diphenhydramine-acetaminophen (TYLENOL PM) 25-500 MG TABS tablet Take 1-2 tablets by mouth at bedtime as needed (pain).    Marland Kitchen docusate sodium (COLACE) 100 MG capsule Take 1 capsule (100 mg total) by mouth every 12 (twelve) hours for 10 days. 20 capsule 0  . ferrous sulfate 324 MG TBEC Take 324 mg by mouth daily with breakfast.    . fluticasone (FLONASE) 50 MCG/ACT nasal spray Place 1 spray into both nostrils daily. 16 g 3  . levothyroxine (SYNTHROID) 175 MCG tablet Take 1 tablet (175 mcg total) by mouth daily before breakfast. 30 tablet 1  . lidocaine-prilocaine (EMLA) cream Apply 1 application topically as needed. 30 g 1  . methocarbamol (ROBAXIN) 500 MG tablet Take 500 mg by mouth daily.    . Multiple Vitamins-Minerals (PRESERVISION AREDS 2 PO) Take by mouth.    . senna-docusate (SENNA S) 8.6-50 MG tablet 1 to 2 twice daily for constipation 120 tablet 5  . oxyCODONE-acetaminophen (PERCOCET/ROXICET) 5-325 MG tablet Take 1 tablet by mouth every 8 (eight) hours as needed for severe pain. 15 tablet 0   No current facility-administered medications for this visit.    SURGICAL HISTORY:  Past Surgical History:  Procedure Laterality Date  . 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  BELPHAROPTOSIS REPAIR Bilateral   . CATARACT EXTRACTION W/ INTRAOCULAR LENS  IMPLANT, BILATERAL    . COLONOSCOPY N/A 11/03/2014   Procedure: COLONOSCOPY;  Surgeon: Rogene Houston, MD;  Location: [facility];  Service: Endoscopy;  Laterality: N/A;  1225  . IR IMAGING GUIDED PORT INSERTION  07/11/2018  . KNEE CARTILAGE SURGERY Left    Left knee  . SKIN CANCER EXCISION  12/2014, 04/25/15  . VIDEO BRONCHOSCOPY Bilateral 05/09/2018   Procedure: VIDEO BRONCHOSCOPY WITH FLUORO;  Surgeon: Juanito Doom, MD;  Location: [facility];  Service: Cardiopulmonary;  Laterality: Bilateral;    REVIEW OF SYSTEMS:   Review of Systems  Constitutional:Positive for fatigue.Negative for appetite change, fever and unexpected weight change.  HENT:Negative for mouth sores, nosebleeds,thrush,sore throat and trouble swallowing.  Eyes: Negative for eye problems and icterus.  Respiratory:Positive for shortness of breath and cough.  Negative for hemoptysis and wheezing.  Cardiovascular: Negative  for chest pain and leg swelling.  Gastrointestinal: Positive for right lower quadrant abdominal pain (none at this time). negative for constipation, diarrhea, nausea and vomiting.  Genitourinary: Negative for bladder incontinence, difficulty urinating, dysuria, frequency and hematuria (microscopic).  Musculoskeletal:Positive for chronic back pain secondary to hx of herniated disk and new low back pain which radiates across his low back.Negative for gait problem, neck pain and neck stiffness.  Skin:Negative for rash or itching.  Neurological: Negative for dizziness, extremity weakness, gait problem, headaches, light-headedness and seizures.  Hematological: Negative for adenopathy. Does not bruise/bleed easily.  Psychiatric/Behavioral: Negative for confusion, depression and sleep disturbance. The patient is not nervous/anxious.  nervous/anxious.     PHYSICAL EXAMINATION:  Blood pressure 128/68, pulse 73,  temperature 98 F (36.7 C), temperature source Tympanic, resp. rate 17, height 6\' 2"  (1.88 m), weight 249 lb 8 oz (113.2 kg), SpO2 98 %.  ECOG PERFORMANCE STATUS: 1 - Symptomatic but completely ambulatory  Physical Exam  Constitutional: Oriented to person, place, and time and well-developed, well-nourished, and in no distress.  HENT:  Head: Normocephalic and atraumatic.  Mouth/Throat: Oropharynx is clear and moist. No oropharyngeal exudate.  Eyes: Conjunctivae are normal. Right eye exhibits no discharge. Left eye exhibits no discharge. No scleral icterus.  Neck: Normal range of motion. Neck supple.  Cardiovascular: Normal rate, regular rhythm, normal heart sounds and intact distal pulses.   Pulmonary/Chest: Effort normal and breath sounds normal. No respiratory distress. No wheezes. No rales.  Abdominal: Soft. Mild RLQ pain to palpation. Bowel sounds are normal. Exhibits no distension and no mass. No rebound tenderness. Musculoskeletal: Normal range of motion. Exhibits no edema. No CVA tenderness.  Lymphadenopathy:    No cervical adenopathy.  Neurological: Alert and oriented to person, place, and time. Exhibits normal muscle tone. Gait normal. Coordination normal.  Skin: Skin is warm and dry. No rash noted. Not diaphoretic. No erythema. No pallor.  Psychiatric: Mood, memory and judgment normal.  Vitals reviewed.  LABORATORY DATA: Lab Results  Component Value Date   WBC 5.9 08/16/2020   HGB 12.9 (L) 08/16/2020   HCT 37.1 (L) 08/16/2020   MCV 91.8 08/16/2020   PLT 151 08/16/2020      Chemistry      Component Value Date/Time   NA 141 08/16/2020 0837   NA 144 04/09/2018 1507   K 4.6 08/16/2020 0837   CL 106 08/16/2020 0837   CO2 28 08/16/2020 0837   BUN 20 08/16/2020 0837   BUN 15 04/09/2018 1507   CREATININE 1.42 (H) 08/16/2020 0837      Component Value Date/Time   CALCIUM 9.2 08/16/2020 0837   ALKPHOS 54 08/16/2020 0837   AST 26 08/16/2020 0837   ALT 18 08/16/2020 0837    BILITOT 0.9 08/16/2020 0837       RADIOGRAPHIC STUDIES:  CT Chest W Contrast  Result Date: 08/15/2020 CLINICAL DATA:  Non-small cell lung cancer staging, status post chemo and XRT, ongoing immunotherapy EXAM: CT CHEST, ABDOMEN, AND PELVIS WITH CONTRAST TECHNIQUE: Multidetector CT imaging of the chest, abdomen and pelvis was performed following the standard protocol during bolus administration of intravenous contrast. CONTRAST:  186mL OMNIPAQUE IOHEXOL 300 MG/ML SOLN, additional oral enteric contrast COMPARISON:  05/23/2020 FINDINGS: CT CHEST FINDINGS Cardiovascular: Right chest port catheter. Aortic atherosclerosis. Normal heart size. Three-vessel coronary artery calcifications and/or stents. No pericardial effusion. Mediastinum/Nodes: Unchanged spiculated soft tissue nodule or lymph node in the left axilla measuring 1.9 x 1.2 cm (series 2, image 8). Thyroid gland, trachea,  and esophagus demonstrate no significant findings. Lungs/Pleura: Slight interval enlargement of a spiculated mass of the anterior left upper lobe measuring 3.2 x 2.7 cm, previously 2.9 x 2.4 cm when measured similarly (series 6, image 73). Unchanged fibrotic scarring and volume loss of the perihilar right lung (series 6, image 70). There is a background of innumerable tiny centrilobular pulmonary nodules most concentrated in the upper lungs. No pleural effusion or pneumothorax. Musculoskeletal: No chest wall mass or suspicious bone lesions identified. CT ABDOMEN PELVIS FINDINGS Hepatobiliary: No solid liver abnormality is seen. No gallstones, gallbladder wall thickening, or biliary dilatation. Pancreas: Unremarkable. No pancreatic ductal dilatation or surrounding inflammatory changes. Spleen: Normal in size without significant abnormality. Adrenals/Urinary Tract: Adrenal glands are unremarkable. There is a 3 mm calculus at the right ureterovesicular junction with mild right hydronephrosis and hydroureter and extensive perinephric and  periureteral fat stranding (series 2, image 117). Additional small bilateral nonobstructive renal calculi. Bladder is unremarkable. Stomach/Bowel: Stomach is within normal limits. Appendix appears normal. No evidence of bowel wall thickening, distention, or inflammatory changes. Descending and sigmoid diverticulosis. Vascular/Lymphatic: Aortic atherosclerosis. No enlarged abdominal or pelvic lymph nodes. Reproductive: No mass or other abnormality. Other: No abdominal wall hernia or abnormality. No abdominopelvic ascites. Musculoskeletal: No acute or significant osseous findings. IMPRESSION: 1. Slight interval enlargement of a spiculated mass of the anterior left upper lobe, consistent with worsened malignancy. 2. Unchanged post treatment fibrotic scarring and volume loss of the perihilar right lung. 3. Unchanged spiculated soft tissue nodule or lymph node in the left axilla. 4. No evidence of metastatic disease in the abdomen or pelvis. 5. There is a 3 mm calculus at the right ureterovesicular junction with mild right hydronephrosis and hydroureter and extensive perinephric and periureteral fat stranding. Additional small bilateral nonobstructive renal calculi. 6. Coronary artery disease. Aortic Atherosclerosis (ICD10-I70.0). These results will be called to the ordering clinician or representative by the Radiologist Assistant, and communication documented in the PACS or [employer]. Electronically Signed   By: Eddie Candle M.D.   On: 08/15/2020 10:08   CT Abdomen Pelvis W Contrast  Result Date: 08/15/2020 CLINICAL DATA:  Non-small cell lung cancer staging, status post chemo and XRT, ongoing immunotherapy EXAM: CT CHEST, ABDOMEN, AND PELVIS WITH CONTRAST TECHNIQUE: Multidetector CT imaging of the chest, abdomen and pelvis was performed following the standard protocol during bolus administration of intravenous contrast. CONTRAST:  13mL OMNIPAQUE IOHEXOL 300 MG/ML SOLN, additional oral enteric contrast  COMPARISON:  05/23/2020 FINDINGS: CT CHEST FINDINGS Cardiovascular: Right chest port catheter. Aortic atherosclerosis. Normal heart size. Three-vessel coronary artery calcifications and/or stents. No pericardial effusion. Mediastinum/Nodes: Unchanged spiculated soft tissue nodule or lymph node in the left axilla measuring 1.9 x 1.2 cm (series 2, image 8). Thyroid gland, trachea, and esophagus demonstrate no significant findings. Lungs/Pleura: Slight interval enlargement of a spiculated mass of the anterior left upper lobe measuring 3.2 x 2.7 cm, previously 2.9 x 2.4 cm when measured similarly (series 6, image 73). Unchanged fibrotic scarring and volume loss of the perihilar right lung (series 6, image 70). There is a background of innumerable tiny centrilobular pulmonary nodules most concentrated in the upper lungs. No pleural effusion or pneumothorax. Musculoskeletal: No chest wall mass or suspicious bone lesions identified. CT ABDOMEN PELVIS FINDINGS Hepatobiliary: No solid liver abnormality is seen. No gallstones, gallbladder wall thickening, or biliary dilatation. Pancreas: Unremarkable. No pancreatic ductal dilatation or surrounding inflammatory changes. Spleen: Normal in size without significant abnormality. Adrenals/Urinary Tract: Adrenal glands are unremarkable. There is a  3 mm calculus at the right ureterovesicular junction with mild right hydronephrosis and hydroureter and extensive perinephric and periureteral fat stranding (series 2, image 117). Additional small bilateral nonobstructive renal calculi. Bladder is unremarkable. Stomach/Bowel: Stomach is within normal limits. Appendix appears normal. No evidence of bowel wall thickening, distention, or inflammatory changes. Descending and sigmoid diverticulosis. Vascular/Lymphatic: Aortic atherosclerosis. No enlarged abdominal or pelvic lymph nodes. Reproductive: No mass or other abnormality. Other: No abdominal wall hernia or abnormality. No abdominopelvic  ascites. Musculoskeletal: No acute or significant osseous findings. IMPRESSION: 1. Slight interval enlargement of a spiculated mass of the anterior left upper lobe, consistent with worsened malignancy. 2. Unchanged post treatment fibrotic scarring and volume loss of the perihilar right lung. 3. Unchanged spiculated soft tissue nodule or lymph node in the left axilla. 4. No evidence of metastatic disease in the abdomen or pelvis. 5. There is a 3 mm calculus at the right ureterovesicular junction with mild right hydronephrosis and hydroureter and extensive perinephric and periureteral fat stranding. Additional small bilateral nonobstructive renal calculi. 6. Coronary artery disease. Aortic Atherosclerosis (ICD10-I70.0). These results will be called to the ordering clinician or representative by the Radiologist Assistant, and communication documented in the PACS or [employer]. Electronically Signed   By: Eddie Candle M.D.   On: 08/15/2020 10:08     ASSESSMENT/PLAN:  This is a very pleasant 74 year old Caucasian male with a very light smoking history. He was diagnosed with stage IV (T3, N0, M1C) non-small cell lung cancer, squamous cell carcinoma based on the biopsy of the left axillary mass. He was diagnosed in August 2019.   He is status post 4 cycles of induction systemic chemotherapy with carboplatin, paclitaxel, and Keytruda with a partial response. Status post 40 cycles. Starting from cycle #5, he is currently undergoing maintenance immunotherapy with Keytruda 200 mg IV every 3 weeks. He developed a skin rash after cycle #7 which resolved with a Medrol Dosepak and Benadryl.Otherwise, he is tolerating treatment well without any adverse effects.  The patient recently had a restaging CT scan performed. Dr. Julien Nordmann personally and independently reviewed the scan and discussed the results with the patient. The scan showed a slight interval enlargement of the spiculated mass in the anterior left  upper lobe.  Dr. Julien Nordmann had a lengthy discussion with the patient regarding this finding.  Dr. Julien Nordmann gave the patient the option of continuing on his current treatment with close monitoring versus referral to radiation oncology for consideration of radiation to this enlarging mass.  After lengthy discussion, the patient decided to continue on his current treatment with close monitoring.  If there is further enlargement on his next restaging scan, we will revisit the option of a referral to radiation.  The scan also noted an incidental small 3 mm right kidney stone which is likely contributing to his back/abdominal pain.  The patient denies any fever, dysuria, nausea, or vomiting.  The patient saw urology in the past but that was a very long time ago and he is no longer established with a urologist to his knowledge.  Dr. Julien Nordmann recommended a referral to urology.  Additionally, I will arrange for the patient to have 1 L of IV fluid administered in clinic today.  The patient was strongly encouraged to increase his oral hydration.  The patient's creatinine is elevated compared to his baseline today.  Therefore, Dr. Julien Nordmann does not recommend prescription NSAIDs. Dr. Julien Nordmann recommends that we sent a prescription for 15 tablets of Percocet to the patient's pharmacy  to use only if needed for severe pain.  I have also printed patient education regarding nephrolithiasis for the patient.   Recommend that he proceed with cycle #41 today as scheduled.   We will see him back for follow-up visit in 3 weeks for evaluation before starting cycle#42with single agent Keytruda.  He will continue to follow with ENT for his sinus trouble.   The patient's Port-A-Cath has been having difficulty with blood return over the last few visits.  I have placed an order for a dye study to be performed to assess the positioning of his Port-A-Cath.  The patient mentions that he developed itching and rash after he had his CT scan  with contrast yesterday.  I have added iodine to his allergy list and discussed premedicating him prior to his next restaging scan with prednisone and Benadryl.  The patient was advised to call immediately if he has any concerning symptoms in the interval. The patient voices understanding of current disease status and treatment options and is in agreement with the current care plan. All questions were answered. The patient knows to call the clinic with any problems, questions or concerns. We can certainly see the patient much sooner if necessary   Orders Placed This Encounter  Procedures  . IR Fluoro Guide CV Line Right    Indicate type of CVC ordering    Standing Status:   Future    Standing Expiration Date:   08/16/2021    Order Specific Question:   Reason for exam:    Answer:   Dye study for port-a-cath. Having trouble with drawing back blood on his port-a-cath    Order Specific Question:   Preferred Imaging Location?    Answer:   [facility]  . Ambulatory referral to Urology    Referral Priority:   Routine    Referral Type:   Consultation    Referral Reason:   Specialty Services Required    Requested Specialty:   Urology    Number of Visits Requested:   [place], PA-C 08/16/20  ADDENDUM: Hematology/Oncology Attending: I had a face-to-face encounter with the patient today.  I recommended his care plan.  This is a very pleasant 74 years old white male with metastatic non-small cell lung cancer, squamous cell carcinoma status post induction systemic chemotherapy with carboplatin, paclitaxel and Keytruda and he is currently on maintenance treatment with single agent Keytruda status post 40 cycles.  The patient has been tolerating this treatment well.  Has been complaining recently of chest congestion and was seen at the emergency department a week ago with abdominal pain with constipation improved after a bowel movement when he took magnesium citrate. He  also complains of pain on the left flank area and urinalysis showed increased red blood cells. He had repeat CT scan of the chest, abdomen pelvis performed recently.  I personally and independently reviewed the scans and discussed the results with the patient today. His scan showed stable disease except for a slightly enlarging left upper lobe lung mass.  The patient also has 3 mm stone at the right ureterovesicular junction. I discussed with the patient several options for management of his condition including continuation of the treatment with immunotherapy and close monitoring of the slightly enlarging left upper lobe lung mass versus continuation of his treatment with [facility] but referring the patient to radiation oncology for consideration of SBRT to the enlarging solitary lesion. After discussion of the options, the patient  would like to continue with his current treatment with [facility] and will monitor the left upper lobe lung mass for now.  If it continues to increase in size, will consider him for SBRT. For the 3 mm urinary calculus, we will give the patient a liter of normal saline today and he was also given Percocet for pain management.  We will refer the patient to urology for evaluation and recommendation regarding his condition. For the constipation the patient was advised to continue on stool softener and use magnesium citrate if he has no bowel movement for 2-3 days. He will come back for follow-up visit in 3 weeks for evaluation before the next cycle of his treatment. He was advised to call immediately if he has any concerning symptoms in the interval.  Disclaimer: This note was dictated with voice recognition software. Similar sounding words can inadvertently be transcribed and may be missed upon review. Eilleen Kempf, MD 08/16/20

## 2020-08-14 ENCOUNTER — Encounter (HOSPITAL_COMMUNITY): Payer: Self-pay

## 2020-08-14 ENCOUNTER — Emergency Department (HOSPITAL_COMMUNITY)
Admission: EM | Admit: 2020-08-14 | Discharge: 2020-08-14 | Disposition: A | Discharge status: Home / Self Care | Payer: Medicare Other | Attending: Emergency Medicine | Admitting: Emergency Medicine

## 2020-08-14 ENCOUNTER — Other Ambulatory Visit: Payer: Self-pay

## 2020-08-14 DIAGNOSIS — E039 Hypothyroidism, unspecified: Secondary | ICD-10-CM | POA: Insufficient documentation

## 2020-08-14 DIAGNOSIS — Z85828 Personal history of other malignant neoplasm of skin: Secondary | ICD-10-CM | POA: Diagnosis not present

## 2020-08-14 DIAGNOSIS — I2510 Atherosclerotic heart disease of native coronary artery without angina pectoris: Secondary | ICD-10-CM | POA: Diagnosis not present

## 2020-08-14 DIAGNOSIS — Z85118 Personal history of other malignant neoplasm of bronchus and lung: Secondary | ICD-10-CM | POA: Diagnosis not present

## 2020-08-14 DIAGNOSIS — R1031 Right lower quadrant pain: Secondary | ICD-10-CM | POA: Insufficient documentation

## 2020-08-14 DIAGNOSIS — R109 Unspecified abdominal pain: Secondary | ICD-10-CM

## 2020-08-14 DIAGNOSIS — I10 Essential (primary) hypertension: Secondary | ICD-10-CM | POA: Diagnosis not present

## 2020-08-14 DIAGNOSIS — Z87891 Personal history of nicotine dependence: Secondary | ICD-10-CM | POA: Insufficient documentation

## 2020-08-14 DIAGNOSIS — K5900 Constipation, unspecified: Secondary | ICD-10-CM | POA: Diagnosis not present

## 2020-08-14 LAB — CBC
HCT: 44.7 % (ref 39.0–52.0)
Hemoglobin: 15.5 g/dL (ref 13.0–17.0)
MCH: 32.2 pg (ref 26.0–34.0)
MCHC: 34.7 g/dL (ref 30.0–36.0)
MCV: 92.7 fL (ref 80.0–100.0)
Platelets: 186 10*3/uL (ref 150–400)
RBC: 4.82 MIL/uL (ref 4.22–5.81)
RDW: 12.2 % (ref 11.5–15.5)
WBC: 12 10*3/uL — ABNORMAL HIGH (ref 4.0–10.5)
nRBC: 0 % (ref 0.0–0.2)

## 2020-08-14 LAB — URINALYSIS, ROUTINE W REFLEX MICROSCOPIC
Bilirubin Urine: NEGATIVE
Glucose, UA: NEGATIVE mg/dL
Ketones, ur: 5 mg/dL — AB
Leukocytes,Ua: NEGATIVE
Nitrite: NEGATIVE
Protein, ur: NEGATIVE mg/dL
RBC / HPF: >50 RBC/hpf — ABNORMAL HIGH (ref 0–5)
Specific Gravity, Urine: 1.013 (ref 1.005–1.030)
pH: 5 (ref 5.0–8.0)

## 2020-08-14 LAB — LIPASE, BLOOD: Lipase: 22 U/L (ref 11–51)

## 2020-08-14 LAB — COMPREHENSIVE METABOLIC PANEL
ALT: 23 U/L (ref 0–44)
AST: 44 U/L — ABNORMAL HIGH (ref 15–41)
Albumin: 4.6 g/dL (ref 3.5–5.0)
Alkaline Phosphatase: 69 U/L (ref 38–126)
Anion gap: 11 (ref 5–15)
BUN: 17 mg/dL (ref 8–23)
CO2: 23 mmol/L (ref 22–32)
Calcium: 9.3 mg/dL (ref 8.9–10.3)
Chloride: 103 mmol/L (ref 98–111)
Creatinine, Ser: 1.38 mg/dL — ABNORMAL HIGH (ref 0.61–1.24)
GFR, Estimated: 54 mL/min — ABNORMAL LOW (ref >60)
Glucose, Bld: 126 mg/dL — ABNORMAL HIGH (ref 70–99)
Potassium: 4.5 mmol/L (ref 3.5–5.1)
Sodium: 137 mmol/L (ref 135–145)
Total Bilirubin: 0.9 mg/dL (ref 0.3–1.2)
Total Protein: 8.5 g/dL — ABNORMAL HIGH (ref 6.5–8.1)

## 2020-08-14 MED ORDER — DOCUSATE SODIUM 100 MG PO CAPS: 100.0000 mg | ORAL_CAPSULE | Freq: Two times a day (BID) | ORAL | 0 refills | Status: AC

## 2020-08-14 NOTE — ED Notes (Signed)
Patient approached the nurses station stating he had a bowel movement and some of his pain resolved.

## 2020-08-14 NOTE — ED Provider Notes (Addendum)
[facility] DEPT Provider Note   CSN: [id number] Arrival date & time: 08/14/20  1857     History Chief Complaint  Patient presents with  . Abdominal Pain    Travis Padilla is a 74 y.o. male.  HPI      Travis Padilla is a 74 y.o. male, with a history of lung cancer, HTN, hyperlipidemia, presenting to the ED with abdominal pain beginning this morning. He began having persistent pain in the area of the right lower quadrant upon waking this morning.  It was at the level of around 7 or 8/10, nonradiating. Last bowel movement was yesterday. While waiting to be seen, patient states he had a soft bowel movement and his discomfort is greatly relieved.  Denies fever/chills, nausea/vomiting, melena, dysuria, syncope, or any other complaints.    Past Medical History:  Diagnosis Date  . Abnormal nuclear stress test    December, 2013  . Anemia   . Axillary adenopathy 04/24/2018  . CAD (coronary artery disease)    Mild nonobstructive plaque in cath 2013  . Chest pain    December, 2013  . Cough with hemoptysis 04/22/2018  . Dizziness   . Dyslipidemia 07/14/2019  . Encounter for antineoplastic immunotherapy 05/13/2018  . Gout   . Hemorrhoid   . Hyperlipidemia   . Hypertension   . Hypothyroidism (acquired) 08/11/2018  . 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) ([place]) dx'd 04/17/18   to LN, infrahilar mass, lung nodule and lt axilla  . Obesity, unspecified 03/28/2009   Qualifier: Diagnosis of  By: [name], MD, [name]    . Palpitations 03/28/2009   Qualifier: Diagnosis of  By: [name], MD, [name]    . Right lower lobe lung mass 04/22/2018  . Skin cancer   . SNORING 03/28/2009   Qualifier: Diagnosis of  By: [name], MD, [name]    . Spinal stenosis   . 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 Itching 11/17/2019  . Dyslipidemia 07/14/2019  . Shoulder pain 03/31/2019  . Port-A-Cath in place 08/11/2018  . Hypothyroidism (acquired) 08/11/2018  . 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 Encounter for antineoplastic chemotherapy 05/13/2018  . Encounter for antineoplastic immunotherapy 05/13/2018  . Goals of care, counseling/discussion 05/13/2018  . 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 Axillary adenopathy 04/24/2018  . Right lower lobe lung mass 04/22/2018  . Cough with hemoptysis 04/22/2018  . Wheezing 04/02/2018  . Long-term use of high-risk medication 04/02/2018  . Dizziness 06/26/2017  . Fatigue 04/30/2016  . Insomnia 04/30/2016  . Sinusitis 01/31/2016  . Hyperlipidemia 01/31/2016  . Ear pain 05/09/2015  . Anemia   . Abnormal nuclear stress test   . OBESITY, UNSPECIFIED 03/28/2009  . ESSENTIAL HYPERTENSION, BENIGN 03/28/2009  . PALPITATIONS 03/28/2009  . SNORING 03/28/2009    Past Surgical History:  Procedure Laterality Date  . 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 BELPHAROPTOSIS REPAIR Bilateral   . CATARACT EXTRACTION W/ INTRAOCULAR LENS  IMPLANT, BILATERAL    . COLONOSCOPY N/A 11/03/2014   Procedure: COLONOSCOPY;  Surgeon: Rogene Houston, MD;  Location: [facility];  Service: Endoscopy;  Laterality: N/A;  1225  . IR IMAGING GUIDED PORT INSERTION  07/11/2018  . KNEE CARTILAGE SURGERY Left    Left knee  . SKIN CANCER EXCISION  12/2014, 04/25/15  . VIDEO BRONCHOSCOPY Bilateral 05/09/2018   Procedure: VIDEO BRONCHOSCOPY WITH FLUORO;  Surgeon: Juanito Doom, MD;  Location: [facility];  Service: Cardiopulmonary;  Laterality: Bilateral;       Family History  Problem Relation Age of Onset  . Heart attack Father   . Heart failure Mother   . Parkinson's disease Mother   . Healthy Sister   . Skin cancer Sister   . Neuropathy Brother   . COPD Brother     . Epilepsy Brother   . Healthy Sister   . Healthy Sister   . Colon cancer Neg Hx     Social History   Tobacco Use  . Smoking status: Former Smoker    Packs/day: 0.20    Years: 2.00    Pack years: 0.40    Types: Cigarettes    Start date: 10/01/1968  . Smokeless tobacco: Never Used  Vaping Use  . Vaping Use: Never used  Substance Use Topics  . Alcohol use: No    Comment: hx of   . Drug use: No    Home Medications Prior to Admission medications   Medication Sig Start Date End Date Taking? Authorizing Provider  albuterol (PROVENTIL) (2.5 MG/3ML) 0.083% nebulizer solution Inhale into the lungs. 06/21/20 06/21/21  [provider]  Alirocumab (PRALUENT) 150 MG/ML SOAJ Inject 150 mg into the skin every 14 (fourteen) days. 04/11/20   [name], MD  Artificial Tear Solution (SOOTHE XP OP) Place 1 drop into both eyes 2 (two) times daily.    [provider]  azelastine (ASTELIN) 0.1 % nasal spray Place 1 spray into both nostrils 2 (two) times daily. 07/27/20   [name], DO  carvedilol (COREG) 12.5 MG tablet Take 1 tablet (12.5 mg total) by mouth in the morning, at noon, and at bedtime. 01/29/20   [name], MD  Cyanocobalamin (VITAMIN B 12) 250 MCG LOZG Take by mouth.    [provider]  diphenhydramine-acetaminophen (TYLENOL PM) 25-500 MG TABS tablet Take 1-2 tablets by mouth at bedtime as needed (pain).    [provider]  docusate sodium (COLACE) 100 MG capsule Take 1 capsule (100 mg total) by mouth every 12 (twelve) hours for 10 days. 08/14/20 08/24/20  Shaheed Schmuck C, PA-C  ferrous sulfate 324 MG TBEC Take 324 mg by mouth daily with breakfast.    [provider]  fluticasone (FLONASE) 50 MCG/ACT nasal spray Place 1 spray into both nostrils daily. 07/01/20   Lauraine Rinne, NP  levothyroxine (SYNTHROID) 175 MCG tablet Take 1 tablet (175 mcg total) by mouth daily before breakfast. 07/26/20   Curt Bears, MD   lidocaine-prilocaine (EMLA) cream Apply 1 application topically as needed. 04/21/19   Heilingoetter, Cassandra L, PA-C  methocarbamol (ROBAXIN) 500 MG tablet Take 500 mg by mouth daily. 08/11/19   [provider]  Multiple Vitamins-Minerals (PRESERVISION AREDS 2 PO) Take by mouth.    [provider]  senna-docusate (SENNA S) 8.6-50 MG tablet 1 to 2 twice daily for constipation 07/07/18   Harle Stanford., PA-C    Allergies    Pravastatin  Review of Systems   Review of Systems  Constitutional: Negative for fever.  Respiratory: Negative for shortness of breath.   Cardiovascular: Negative for chest pain.  Gastrointestinal: Positive for abdominal pain. Negative for blood in stool, nausea and vomiting.  Neurological: Negative for  dizziness, syncope and weakness.    Physical Exam Updated Vital Signs BP (!) 167/114 (BP Location: Left Arm)   Pulse (!) 110   Temp 98.8 F (37.1 C) (Oral)   Resp 18   SpO2 98%   Physical Exam Vitals and nursing note reviewed.  Constitutional:      General: He is not in acute distress.    Appearance: He is well-developed. He is not diaphoretic.  HENT:     Head: Normocephalic and atraumatic.     Mouth/Throat:     Mouth: Mucous membranes are moist.     Pharynx: Oropharynx is clear.  Eyes:     Conjunctiva/sclera: Conjunctivae normal.  Cardiovascular:     Rate and Rhythm: Normal rate and regular rhythm.     Pulses: Normal pulses.  Pulmonary:     Effort: Pulmonary effort is normal. No respiratory distress.     Breath sounds: Normal breath sounds.  Abdominal:     General: Bowel sounds are normal.     Palpations: Abdomen is soft.     Tenderness: There is no abdominal tenderness. There is no guarding.     Comments: At the time of my evaluation, patient's abdominal exam was completely benign.  Musculoskeletal:     Cervical back: Neck supple.  Skin:    General: Skin is warm and dry.  Neurological:     Mental Status: He is alert.   Psychiatric:        Mood and Affect: Mood and affect normal.        Speech: Speech normal.        Behavior: Behavior normal.     ED Results / Procedures / Treatments   Labs (all labs ordered are listed, but only abnormal results are displayed) Labs Reviewed  COMPREHENSIVE METABOLIC PANEL - Abnormal; Notable for the following components:      Result Value   Glucose, Bld 126 (*)    Creatinine, Ser 1.38 (*)    Total Protein 8.5 (*)    AST 44 (*)    GFR, Estimated 54 (*)    All other components within normal limits  CBC - Abnormal; Notable for the following components:   WBC 12.0 (*)    All other components within normal limits  URINALYSIS, ROUTINE W REFLEX MICROSCOPIC - Abnormal; Notable for the following components:   Hgb urine dipstick LARGE (*)    Ketones, ur 5 (*)    RBC / HPF >50 (*)    Bacteria, UA RARE (*)    All other components within normal limits  LIPASE, BLOOD    EKG None  Radiology No results found.  Procedures Procedures (including critical care time)  Medications Ordered in ED Medications - No data to display  ED Course  I have reviewed the triage vital signs and the nursing notes.  Pertinent labs & imaging results that were available during my care of the patient were reviewed by me and considered in my medical decision making (see chart for details).    MDM Rules/Calculators/A&P                          Patient presents with complaint of abdominal pain and inability to have a bowel movement. Patient is nontoxic appearing, afebrile, not tachycardic on my exam, not tachypneic, not hypotensive, maintains excellent SPO2 on room air, and is in no apparent distress.   I have reviewed the patient's chart to obtain more information.   I reviewed  and interpreted the patient's labs. Mild leukocytosis, nonspecific.  Mild increase in creatinine, however, tolerating p.o. fluids. Microscopic hematuria on UA, outpatient follow-up. Patient's symptoms  resolved prior to my evaluation.  Benign abdominal exam. Additionally, patient is scheduled for CT chest, abdomen, and pelvis with contrast tomorrow as follow-up for his cancer treatments. I sent a message to the patient's oncologist, Dr. Julien Nordmann, to bring the patient's ED visit to his attention as well as to assure he has the patient's most recent complaint in mind when he reviews the patient's imaging.    Findings and plan of care discussed with Adrian Prows, MD. Dr. Vanita Panda personally evaluated and examined this patient.   Vitals:   08/14/20 1918 08/14/20 2030  BP: (!) 167/114 (!) 171/94  Pulse: (!) 110 95  Resp: 18 17  Temp: 98.8 F (37.1 C)   TempSrc: Oral   SpO2: 98% 96%     Final Clinical Impression(s) / ED Diagnoses Final diagnoses:  Right sided abdominal pain    Rx / DC Orders ED Discharge Orders         Ordered    docusate sodium (COLACE) 100 MG capsule  Every 12 hours        08/14/20 2037           Lorayne Bender, PA-C 08/14/20 2106    Lorayne Bender, PA-C 08/14/20 2106    Carmin Muskrat, MD 08/14/20 2225

## 2020-08-14 NOTE — Discharge Instructions (Signed)
Constipation  Please follow the instructions below:  Hydration: You should start drinking at least ten, 8 oz glasses of water a day to help relieve your constipation. Baseline hydration should be at least eight, 8 oz glasses of water a day. This is a daily amount that you should be drinking, even without your current issue. Proper hydration not only helps prevent constipation, it is essential for any of the following treatments to be effective.  Fiber: Begin taking the fiber supplement daily. You should also increase the fiber in your diet.  Docusate: May take this stool softener, as prescribed, daily for the next 10 days.  This will help facilitate regular bowel movements.  Follow-up: Follow-up with your primary care provider as soon as possible for continued management of this issue.  Return: Return to the ED should any symptoms worsen.  For prescription assistance, may try using prescription discount sites or apps, such as goodrx.com

## 2020-08-14 NOTE — ED Triage Notes (Signed)
Patient arrived with complaints of RLQ abdominal pain that started at 6am. Reports taking OTC medication and enemas due to concern for constipation. States he went to UC and given magnesium citrate.

## 2020-08-15 ENCOUNTER — Ambulatory Visit (HOSPITAL_COMMUNITY)
Admission: RE | Admit: 2020-08-15 | Discharge: 2020-08-15 | Disposition: A | Discharge status: Home / Self Care | Payer: Medicare Other | Source: Ambulatory Visit | Attending: Internal Medicine | Admitting: Internal Medicine

## 2020-08-15 ENCOUNTER — Encounter (HOSPITAL_COMMUNITY): Payer: Self-pay

## 2020-08-15 DIAGNOSIS — R590 Localized enlarged lymph nodes: Secondary | ICD-10-CM | POA: Diagnosis not present

## 2020-08-15 DIAGNOSIS — N200 Calculus of kidney: Secondary | ICD-10-CM | POA: Diagnosis not present

## 2020-08-15 DIAGNOSIS — C3490 Malignant neoplasm of unspecified part of unspecified bronchus or lung: Secondary | ICD-10-CM | POA: Insufficient documentation

## 2020-08-15 MED ORDER — IOHEXOL 300 MG/ML  SOLN
100.0000 mL | Freq: Once | INTRAMUSCULAR | Status: AC | PRN
  Administered 2020-08-15: 100 mL via INTRAVENOUS

## 2020-08-16 ENCOUNTER — Other Ambulatory Visit: Payer: Self-pay

## 2020-08-16 ENCOUNTER — Inpatient Hospital Stay: Payer: Medicare Other | Attending: Oncology | Admitting: Physician Assistant

## 2020-08-16 ENCOUNTER — Encounter: Payer: Self-pay | Admitting: Physician Assistant

## 2020-08-16 ENCOUNTER — Telehealth: Payer: Self-pay | Admitting: Internal Medicine

## 2020-08-16 ENCOUNTER — Inpatient Hospital Stay: Payer: Medicare Other

## 2020-08-16 VITALS — BP 128/68 | HR 73 | Temp 98.0°F | Resp 17 | Ht 74.0 in | Wt 249.5 lb

## 2020-08-16 DIAGNOSIS — I10 Essential (primary) hypertension: Secondary | ICD-10-CM | POA: Insufficient documentation

## 2020-08-16 DIAGNOSIS — E785 Hyperlipidemia, unspecified: Secondary | ICD-10-CM | POA: Insufficient documentation

## 2020-08-16 DIAGNOSIS — M545 Low back pain, unspecified: Secondary | ICD-10-CM | POA: Insufficient documentation

## 2020-08-16 DIAGNOSIS — C773 Secondary and unspecified malignant neoplasm of axilla and upper limb lymph nodes: Secondary | ICD-10-CM | POA: Insufficient documentation

## 2020-08-16 DIAGNOSIS — C3491 Malignant neoplasm of unspecified part of right bronchus or lung: Secondary | ICD-10-CM

## 2020-08-16 DIAGNOSIS — N200 Calculus of kidney: Secondary | ICD-10-CM

## 2020-08-16 DIAGNOSIS — Z85828 Personal history of other malignant neoplasm of skin: Secondary | ICD-10-CM | POA: Insufficient documentation

## 2020-08-16 DIAGNOSIS — G8929 Other chronic pain: Secondary | ICD-10-CM | POA: Diagnosis not present

## 2020-08-16 DIAGNOSIS — J329 Chronic sinusitis, unspecified: Secondary | ICD-10-CM | POA: Diagnosis not present

## 2020-08-16 DIAGNOSIS — C3401 Malignant neoplasm of right main bronchus: Secondary | ICD-10-CM | POA: Insufficient documentation

## 2020-08-16 DIAGNOSIS — Z79899 Other long term (current) drug therapy: Secondary | ICD-10-CM | POA: Diagnosis not present

## 2020-08-16 DIAGNOSIS — Z95828 Presence of other vascular implants and grafts: Secondary | ICD-10-CM

## 2020-08-16 DIAGNOSIS — I2510 Atherosclerotic heart disease of native coronary artery without angina pectoris: Secondary | ICD-10-CM | POA: Insufficient documentation

## 2020-08-16 DIAGNOSIS — C7802 Secondary malignant neoplasm of left lung: Secondary | ICD-10-CM | POA: Diagnosis not present

## 2020-08-16 DIAGNOSIS — J320 Chronic maxillary sinusitis: Secondary | ICD-10-CM | POA: Diagnosis not present

## 2020-08-16 DIAGNOSIS — Z9221 Personal history of antineoplastic chemotherapy: Secondary | ICD-10-CM | POA: Diagnosis not present

## 2020-08-16 DIAGNOSIS — Z5112 Encounter for antineoplastic immunotherapy: Secondary | ICD-10-CM | POA: Diagnosis not present

## 2020-08-16 DIAGNOSIS — R5383 Other fatigue: Secondary | ICD-10-CM | POA: Insufficient documentation

## 2020-08-16 DIAGNOSIS — C3490 Malignant neoplasm of unspecified part of unspecified bronchus or lung: Secondary | ICD-10-CM | POA: Diagnosis not present

## 2020-08-16 DIAGNOSIS — E039 Hypothyroidism, unspecified: Secondary | ICD-10-CM

## 2020-08-16 DIAGNOSIS — J3489 Other specified disorders of nose and nasal sinuses: Secondary | ICD-10-CM | POA: Diagnosis not present

## 2020-08-16 LAB — CBC WITH DIFFERENTIAL (CANCER CENTER ONLY)
Abs Immature Granulocytes: 0.03 10*3/uL (ref 0.00–0.07)
Basophils Absolute: 0 10*3/uL (ref 0.0–0.1)
Basophils Relative: 1 %
Eosinophils Absolute: 0.3 10*3/uL (ref 0.0–0.5)
Eosinophils Relative: 5 %
HCT: 37.1 % — ABNORMAL LOW (ref 39.0–52.0)
Hemoglobin: 12.9 g/dL — ABNORMAL LOW (ref 13.0–17.0)
Immature Granulocytes: 1 %
Lymphocytes Relative: 18 %
Lymphs Abs: 1 10*3/uL (ref 0.7–4.0)
MCH: 31.9 pg (ref 26.0–34.0)
MCHC: 34.8 g/dL (ref 30.0–36.0)
MCV: 91.8 fL (ref 80.0–100.0)
Monocytes Absolute: 0.9 10*3/uL (ref 0.1–1.0)
Monocytes Relative: 15 %
Neutro Abs: 3.6 10*3/uL (ref 1.7–7.7)
Neutrophils Relative %: 60 %
Platelet Count: 151 10*3/uL (ref 150–400)
RBC: 4.04 MIL/uL — ABNORMAL LOW (ref 4.22–5.81)
RDW: 12.4 % (ref 11.5–15.5)
WBC Count: 5.9 10*3/uL (ref 4.0–10.5)
nRBC: 0 % (ref 0.0–0.2)

## 2020-08-16 LAB — CMP (CANCER CENTER ONLY)
ALT: 18 U/L (ref 0–44)
AST: 26 U/L (ref 15–41)
Albumin: 3.8 g/dL (ref 3.5–5.0)
Alkaline Phosphatase: 54 U/L (ref 38–126)
Anion gap: 7 (ref 5–15)
BUN: 20 mg/dL (ref 8–23)
CO2: 28 mmol/L (ref 22–32)
Calcium: 9.2 mg/dL (ref 8.9–10.3)
Chloride: 106 mmol/L (ref 98–111)
Creatinine: 1.42 mg/dL — ABNORMAL HIGH (ref 0.61–1.24)
GFR, Estimated: 52 mL/min — ABNORMAL LOW (ref >60)
Glucose, Bld: 111 mg/dL — ABNORMAL HIGH (ref 70–99)
Potassium: 4.6 mmol/L (ref 3.5–5.1)
Sodium: 141 mmol/L (ref 135–145)
Total Bilirubin: 0.9 mg/dL (ref 0.3–1.2)
Total Protein: 7.4 g/dL (ref 6.5–8.1)

## 2020-08-16 LAB — TSH: TSH: 2.484 u[IU]/mL (ref 0.320–4.118)

## 2020-08-16 MED ORDER — SODIUM CHLORIDE 0.9 % IV SOLN
INTRAVENOUS | Status: DC
  Filled 2020-08-16 (×2): qty 250

## 2020-08-16 MED ORDER — SODIUM CHLORIDE 0.9 % IV SOLN
200.0000 mg | Freq: Once | INTRAVENOUS | Status: AC
  Administered 2020-08-16: 200 mg via INTRAVENOUS
  Filled 2020-08-16: qty 8

## 2020-08-16 MED ORDER — OXYCODONE-ACETAMINOPHEN 5-325 MG PO TABS: 1.0000 | ORAL_TABLET | Freq: Three times a day (TID) | ORAL | 0 refills | Status: DC | PRN

## 2020-08-16 MED ORDER — ALTEPLASE 2 MG IJ SOLR
INTRAMUSCULAR | Status: AC
  Filled 2020-08-16: qty 2

## 2020-08-16 MED ORDER — SODIUM CHLORIDE 0.9% FLUSH
10.0000 mL | Freq: Once | INTRAVENOUS | Status: AC
  Administered 2020-08-16: 10 mL
  Filled 2020-08-16: qty 10

## 2020-08-16 MED ORDER — HEPARIN SOD (PORK) LOCK FLUSH 100 UNIT/ML IV SOLN
500.0000 [IU] | Freq: Once | INTRAVENOUS | Status: AC | PRN
  Administered 2020-08-16: 500 [IU]
  Filled 2020-08-16: qty 5

## 2020-08-16 MED ORDER — SODIUM CHLORIDE 0.9% FLUSH
10.0000 mL | INTRAVENOUS | Status: DC | PRN
  Administered 2020-08-16: 10 mL
  Filled 2020-08-16: qty 10

## 2020-08-16 MED ORDER — SODIUM CHLORIDE 0.9 % IV SOLN
Freq: Once | INTRAVENOUS | Status: AC
  Filled 2020-08-16: qty 250

## 2020-08-16 MED ORDER — ALTEPLASE 2 MG IJ SOLR
2.0000 mg | Freq: Once | INTRAMUSCULAR | Status: AC
  Administered 2020-08-16: 2 mg
  Filled 2020-08-16: qty 2

## 2020-08-16 NOTE — Patient Instructions (Signed)

## 2020-08-16 NOTE — Patient Instructions (Signed)
Kidney Stones  Kidney stones are solid, rock-like deposits that form inside of the kidneys. The kidneys are a pair of organs that make urine. A kidney stone may form in a kidney and move into other parts of the urinary tract, including the tubes that connect the kidneys to the bladder (ureters), the bladder, and the tube that carries urine out of the body (urethra). As the stone moves through these areas, it can cause intense pain and block the flow of urine. Kidney stones are created when high levels of certain minerals are found in the urine. The stones are usually passed out of the body through urination, but in some cases, medical treatment may be needed to remove them. What are the causes? Kidney stones may be caused by:  A condition in which certain glands produce too much parathyroid hormone (primary hyperparathyroidism), which causes too much calcium buildup in the blood.  A buildup of uric acid crystals in the bladder (hyperuricosuria). Uric acid is a chemical that the body produces when you eat certain foods. It usually exits the body in the urine.  Narrowing (stricture) of one or both of the ureters.  A kidney blockage that is present at birth (congenital obstruction).  Past surgery on the kidney or the ureters, such as gastric bypass surgery. What increases the risk? The following factors may make you more likely to develop this condition:  Having had a kidney stone in the past.  Having a family history of kidney stones.  Not drinking enough water.  Eating a diet that is high in protein, salt (sodium), or sugar.  Being overweight or obese. What are the signs or symptoms? Symptoms of a kidney stone may include:  Pain in the side of the abdomen, right below the ribs (flank pain). Pain usually spreads (radiates) to the groin.  Needing to urinate frequently or urgently.  Painful urination.  Blood in the urine (hematuria).  Nausea.  Vomiting.  Fever and chills. How  is this diagnosed? This condition may be diagnosed based on:  Your symptoms and medical history.  A physical exam.  Blood tests.  Urine tests. These may be done before and after the stone passes out of your body through urination.  Imaging tests, such as a CT scan, abdominal X-ray, or ultrasound.  A procedure to examine the inside of the bladder (cystoscopy). How is this treated? Treatment for kidney stones depends on the size, location, and makeup of the stones. Kidney stones will often pass out of the body through urination. You may need to:  Increase your fluid intake to help pass the stone. In some cases, you may be given fluids through an IV and may need to be monitored at the hospital.  Take medicine for pain.  Make changes in your diet to help prevent kidney stones from coming back. Sometimes, medical procedures are needed to remove a kidney stone. This may involve:  A procedure to break up kidney stones using: ? A focused beam of light (laser therapy). ? Shock waves (extracorporeal shock wave lithotripsy).  Surgery to remove kidney stones. This may be needed if you have severe pain or have stones that block your urinary tract. Follow these instructions at home: Medicines  Take over-the-counter and prescription medicines only as told by your health care provider.  Ask your health care provider if the medicine prescribed to you requires you to avoid driving or using heavy machinery. Eating and drinking  Drink enough fluid to keep your urine pale yellow.   You may be instructed to drink at least 8-10 glasses of water each day. This will help you pass the kidney stone.  If directed, change your diet. This may include: ? Limiting how much sodium you eat. ? Eating more fruits and vegetables. ? Limiting how much animal protein--such as red meat, poultry, fish, and eggs--you eat.  Follow instructions from your health care provider about eating or drinking  restrictions. General instructions  Collect urine samples as told by your health care provider. You may need to collect a urine sample: ? 24 hours after you pass the stone. ? 8-12 weeks after passing the kidney stone, and every 6-12 months after that.  Strain your urine every time you urinate, for as long as directed. Use the strainer that your health care provider recommends.  Do not throw out the kidney stone after passing it. Keep the stone so it can be tested by your health care provider. Testing the makeup of your kidney stone may help prevent you from getting kidney stones in the future.  Keep all follow-up visits as told by your health care provider. This is important. You may need follow-up X-rays or ultrasounds to make sure that your stone has passed. How is this prevented? To prevent another kidney stone:  Drink enough fluid to keep your urine pale yellow. This is the best way to prevent kidney stones.  Eat a healthy diet and follow recommendations from your health care provider about foods to avoid. You may be instructed to eat a low-protein diet. Recommendations vary depending on the type of kidney stone that you have.  Maintain a healthy weight. Where to find more information  National Kidney Foundation (NKF): www.kidney.org  [employer] (UCF): www.urologyhealth.org Contact a health care provider if:  You have pain that gets worse or does not get better with medicine. Get help right away if:  You have a fever or chills.  You develop severe pain.  You develop new abdominal pain.  You faint.  You are unable to urinate. Summary  Kidney stones are solid, rock-like deposits that form inside of the kidneys.  Kidney stones can cause nausea, vomiting, blood in the urine, abdominal pain, and the urge to urinate frequently.  Treatment for kidney stones depends on the size, location, and makeup of the stones. Kidney stones will often pass out of the body  through urination.  Kidney stones can be prevented by drinking enough fluids, eating a healthy diet, and maintaining a healthy weight. This information is not intended to replace advice given to you by your health care provider. Make sure you discuss any questions you have with your health care provider. Document Revised: 01/13/2019 Document Reviewed: 01/13/2019 Elsevier Patient Education  2020 [employer] Inc.  

## 2020-08-16 NOTE — Patient Instructions (Signed)
[facility] Discharge Instructions for Patients Receiving Chemotherapy  Today you received the following chemotherapy agents :  Keytruda.  To help prevent nausea and vomiting after your treatment, we encourage you to take your nausea medication as prescribed.   If you develop nausea and vomiting that is not controlled by your nausea medication, call the clinic.   BELOW ARE SYMPTOMS THAT SHOULD BE REPORTED IMMEDIATELY:  *FEVER GREATER THAN 100.5 F  *CHILLS WITH OR WITHOUT FEVER  NAUSEA AND VOMITING THAT IS NOT CONTROLLED WITH YOUR NAUSEA MEDICATION  *UNUSUAL SHORTNESS OF BREATH  *UNUSUAL BRUISING OR BLEEDING  TENDERNESS IN MOUTH AND THROAT WITH OR WITHOUT PRESENCE OF ULCERS  *URINARY PROBLEMS  *BOWEL PROBLEMS  UNUSUAL RASH Items with * indicate a potential emergency and should be followed up as soon as possible.  Feel free to call the clinic should you have any questions or concerns. The clinic phone number is [phone].  Please show the CHEMO ALERT CARD at check-in to the Emergency Department and triage nurse.  

## 2020-08-16 NOTE — Telephone Encounter (Signed)
Release: [phone] Faxed medical records to [facility] @ [phone]

## 2020-08-16 NOTE — Progress Notes (Signed)
CATHFLO given by Malva Cogan, RN and patient was sent to the lab for peripheral blood draw.

## 2020-08-16 NOTE — Progress Notes (Signed)
Patient discharged from the [facility] in stable condition and with no signs of acute distress. Message sent to Dr. Julien Nordmann and desk nurse regarding inability to get blood return on patient's port. Requested dye study. Patient made aware of the situation.

## 2020-08-17 ENCOUNTER — Encounter: Payer: Self-pay | Admitting: Internal Medicine

## 2020-08-18 ENCOUNTER — Other Ambulatory Visit: Payer: Self-pay | Admitting: Internal Medicine

## 2020-08-18 DIAGNOSIS — C3491 Malignant neoplasm of unspecified part of right bronchus or lung: Secondary | ICD-10-CM

## 2020-08-22 ENCOUNTER — Ambulatory Visit (HOSPITAL_COMMUNITY)
Admission: RE | Admit: 2020-08-22 | Discharge: 2020-08-22 | Disposition: A | Discharge status: Home / Self Care | Payer: Medicare Other | Source: Ambulatory Visit | Attending: Physician Assistant | Admitting: Physician Assistant

## 2020-08-22 ENCOUNTER — Other Ambulatory Visit: Payer: Self-pay

## 2020-08-22 ENCOUNTER — Ambulatory Visit (HOSPITAL_COMMUNITY): Payer: Medicare Other

## 2020-08-22 DIAGNOSIS — C3491 Malignant neoplasm of unspecified part of right bronchus or lung: Secondary | ICD-10-CM

## 2020-08-22 NOTE — Progress Notes (Signed)
Patient ID: Travis Padilla, male   DOB: 1946/04/14, 74 y.o.   MRN: [id number] Patient presented to IR department today for Port-A-Cath injection.  Allergy noted to contrast with itching and hives.  Patient was not premedicated prior to arrival today.  He was given prescription for premedication with Benadryl and prednisone.  Will be rescheduled for 12/15 at noon at [facility].

## 2020-08-24 ENCOUNTER — Ambulatory Visit (HOSPITAL_COMMUNITY)
Admission: RE | Admit: 2020-08-24 | Discharge: 2020-08-24 | Disposition: A | Discharge status: Home / Self Care | Payer: Medicare Other | Source: Ambulatory Visit | Attending: Physician Assistant | Admitting: Physician Assistant

## 2020-08-24 ENCOUNTER — Other Ambulatory Visit: Payer: Self-pay

## 2020-08-24 DIAGNOSIS — C3491 Malignant neoplasm of unspecified part of right bronchus or lung: Secondary | ICD-10-CM | POA: Diagnosis not present

## 2020-08-24 DIAGNOSIS — Z452 Encounter for adjustment and management of vascular access device: Secondary | ICD-10-CM | POA: Diagnosis not present

## 2020-08-24 HISTORY — PX: IR CV LINE INJECTION: IMG2294

## 2020-08-24 MED ORDER — HEPARIN SOD (PORK) LOCK FLUSH 100 UNIT/ML IV SOLN
INTRAVENOUS | Status: AC
  Administered 2020-08-24: 500 [IU]
  Filled 2020-08-24: qty 5

## 2020-08-24 MED ORDER — IOHEXOL 300 MG/ML  SOLN: 50.0000 mL | Freq: Once | INTRAMUSCULAR | Status: DC | PRN

## 2020-08-24 MED ORDER — IOHEXOL 300 MG/ML  SOLN
50.0000 mL | Freq: Once | INTRAMUSCULAR | Status: AC | PRN
  Administered 2020-08-24: 18 mL via INTRAVENOUS

## 2020-09-07 ENCOUNTER — Inpatient Hospital Stay (HOSPITAL_BASED_OUTPATIENT_CLINIC_OR_DEPARTMENT_OTHER): Payer: Medicare Other | Admitting: Internal Medicine

## 2020-09-07 ENCOUNTER — Inpatient Hospital Stay: Payer: Medicare Other

## 2020-09-07 ENCOUNTER — Other Ambulatory Visit: Payer: Self-pay

## 2020-09-07 VITALS — BP 133/87 | HR 74 | Temp 97.8°F | Resp 17 | Ht 74.0 in | Wt 254.5 lb

## 2020-09-07 DIAGNOSIS — C3491 Malignant neoplasm of unspecified part of right bronchus or lung: Secondary | ICD-10-CM

## 2020-09-07 DIAGNOSIS — C773 Secondary and unspecified malignant neoplasm of axilla and upper limb lymph nodes: Secondary | ICD-10-CM | POA: Diagnosis not present

## 2020-09-07 DIAGNOSIS — E039 Hypothyroidism, unspecified: Secondary | ICD-10-CM

## 2020-09-07 DIAGNOSIS — Z5112 Encounter for antineoplastic immunotherapy: Secondary | ICD-10-CM | POA: Diagnosis not present

## 2020-09-07 DIAGNOSIS — C7802 Secondary malignant neoplasm of left lung: Secondary | ICD-10-CM | POA: Diagnosis not present

## 2020-09-07 DIAGNOSIS — Z95828 Presence of other vascular implants and grafts: Secondary | ICD-10-CM

## 2020-09-07 DIAGNOSIS — Z9221 Personal history of antineoplastic chemotherapy: Secondary | ICD-10-CM | POA: Diagnosis not present

## 2020-09-07 DIAGNOSIS — R5383 Other fatigue: Secondary | ICD-10-CM | POA: Diagnosis not present

## 2020-09-07 DIAGNOSIS — C3401 Malignant neoplasm of right main bronchus: Secondary | ICD-10-CM | POA: Diagnosis not present

## 2020-09-07 LAB — CBC WITH DIFFERENTIAL (CANCER CENTER ONLY)
Abs Immature Granulocytes: 0.02 10*3/uL (ref 0.00–0.07)
Basophils Absolute: 0.1 10*3/uL (ref 0.0–0.1)
Basophils Relative: 1 %
Eosinophils Absolute: 0.3 10*3/uL (ref 0.0–0.5)
Eosinophils Relative: 5 %
HCT: 37.4 % — ABNORMAL LOW (ref 39.0–52.0)
Hemoglobin: 12.8 g/dL — ABNORMAL LOW (ref 13.0–17.0)
Immature Granulocytes: 0 %
Lymphocytes Relative: 18 %
Lymphs Abs: 1 10*3/uL (ref 0.7–4.0)
MCH: 31.6 pg (ref 26.0–34.0)
MCHC: 34.2 g/dL (ref 30.0–36.0)
MCV: 92.3 fL (ref 80.0–100.0)
Monocytes Absolute: 0.7 10*3/uL (ref 0.1–1.0)
Monocytes Relative: 12 %
Neutro Abs: 3.5 10*3/uL (ref 1.7–7.7)
Neutrophils Relative %: 64 %
Platelet Count: 153 10*3/uL (ref 150–400)
RBC: 4.05 MIL/uL — ABNORMAL LOW (ref 4.22–5.81)
RDW: 12.3 % (ref 11.5–15.5)
WBC Count: 5.6 10*3/uL (ref 4.0–10.5)
nRBC: 0 % (ref 0.0–0.2)

## 2020-09-07 LAB — CMP (CANCER CENTER ONLY)
ALT: 13 U/L (ref 0–44)
AST: 12 U/L — ABNORMAL LOW (ref 15–41)
Albumin: 3.7 g/dL (ref 3.5–5.0)
Alkaline Phosphatase: 60 U/L (ref 38–126)
Anion gap: 4 — ABNORMAL LOW (ref 5–15)
BUN: 14 mg/dL (ref 8–23)
CO2: 26 mmol/L (ref 22–32)
Calcium: 8.8 mg/dL — ABNORMAL LOW (ref 8.9–10.3)
Chloride: 111 mmol/L (ref 98–111)
Creatinine: 0.96 mg/dL (ref 0.61–1.24)
GFR, Estimated: >60 mL/min (ref >60)
Glucose, Bld: 102 mg/dL — ABNORMAL HIGH (ref 70–99)
Potassium: 4.2 mmol/L (ref 3.5–5.1)
Sodium: 141 mmol/L (ref 135–145)
Total Bilirubin: 0.7 mg/dL (ref 0.3–1.2)
Total Protein: 7.1 g/dL (ref 6.5–8.1)

## 2020-09-07 LAB — TSH: TSH: 1.504 u[IU]/mL (ref 0.320–4.118)

## 2020-09-07 MED ORDER — SODIUM CHLORIDE 0.9 % IV SOLN
Freq: Once | INTRAVENOUS | Status: AC
  Filled 2020-09-07: qty 250

## 2020-09-07 MED ORDER — HEPARIN SOD (PORK) LOCK FLUSH 100 UNIT/ML IV SOLN
500.0000 [IU] | Freq: Once | INTRAVENOUS | Status: AC | PRN
  Administered 2020-09-07: 500 [IU]
  Filled 2020-09-07: qty 5

## 2020-09-07 MED ORDER — SODIUM CHLORIDE 0.9 % IV SOLN
200.0000 mg | Freq: Once | INTRAVENOUS | Status: AC
  Administered 2020-09-07: 200 mg via INTRAVENOUS
  Filled 2020-09-07: qty 8

## 2020-09-07 MED ORDER — SODIUM CHLORIDE 0.9% FLUSH
10.0000 mL | INTRAVENOUS | Status: DC | PRN
  Administered 2020-09-07: 10 mL
  Filled 2020-09-07: qty 10

## 2020-09-07 NOTE — Progress Notes (Signed)
[place] [phone]   [phone]  OFFICE PROGRESS NOTE  [name], DO [place] [address]  DIAGNOSIS: Stage IV (T3, N0, M1c) non-small cell lung cancer, squamous cell carcinoma presented with large right infrahilar mass in addition to left upper lobe lung nodule as well as left axillary mass with left axillary lymph node diagnosed in August 2019.  PRIOR THERAPY:  1) Palliative radiotherapy to the right infrahilar mass as well as the axillary mass under the care of Dr. Lisbeth Renshaw. 2) Systemic chemotherapy with carboplatin for AUC of 5, paclitaxel 175 mg/M2 and Keytruda 200 mg IV every 3 weeks status post 4 cycles.  CURRENT THERAPY: Maintenance immunotherapy with single agent Keytruda 200 mg IV every 3 weeks status post 37 cycles.  INTERVAL HISTORY: Travis Padilla 74 y.o. male returns to the clinic today for follow-up visit.  The patient is feeling fine today except for the nasal congestion and cough.  He has been tried on several treatment with Flonase as well as albuterol with minimal improvement.  He has an appointment with the pulmonologist in few weeks.  He denied having any current chest pain, shortness of breath or hemoptysis.  He denied having any recent weight loss or night sweats.  He has no nausea, vomiting, diarrhea or constipation.  He has no headache or visual changes.  The patient is here today for evaluation before starting cycle #42.   MEDICAL HISTORY: Past Medical History:  Diagnosis Date  . Abnormal nuclear stress test    December, 2013  . Anemia   . Axillary adenopathy 04/24/2018  . CAD (coronary artery disease)    Mild nonobstructive plaque in cath 2013  . Chest pain    December, 2013  . Cough with hemoptysis 04/22/2018  . Dizziness   . Dyslipidemia 07/14/2019  . Encounter for antineoplastic immunotherapy 05/13/2018  . Gout   . Hemorrhoid   . Hyperlipidemia   . Hypertension   . Hypothyroidism  (acquired) 08/11/2018  . 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 Obesity, unspecified 03/28/2009   Qualifier: Diagnosis of  By: [name], MD, [name]    . Palpitations 03/28/2009   Qualifier: Diagnosis of  By: [name], MD, [name]    . Right lower lobe lung mass 04/22/2018  . Skin cancer   . SNORING 03/28/2009   Qualifier: Diagnosis of  By: [name], MD, [name]    . Spinal stenosis   . 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[facility]) 05/13/2018    ALLERGIES:  is allergic to pravastatin and iodine.  MEDICATIONS:  Current Outpatient Medications  Medication Sig Dispense Refill  . guaiFENesin-dextromethorphan (ROBITUSSIN DM) 100-10 MG/5ML syrup Take 5 mLs by mouth every 4 (four) hours as needed for cough.    Marland Kitchen albuterol (PROVENTIL) (2.5 MG/3ML) 0.083% nebulizer solution Inhale into the lungs.    . Alirocumab (PRALUENT) 150 MG/ML SOAJ Inject 150 mg into the skin every 14 (fourteen) days. 2 pen 11  . Artificial Tear Solution (SOOTHE XP OP) Place 1 drop into both eyes 2 (two) times daily.    Marland Kitchen azelastine (ASTELIN) 0.1 % nasal spray Place 1 spray into both nostrils 2 (two) times daily. 30 mL 12  . carvedilol (COREG) 12.5 MG tablet Take 1 tablet (12.5 mg total) by mouth in  the morning, at noon, and at bedtime. 270 tablet 3  . Cyanocobalamin (VITAMIN B 12) 250 MCG LOZG Take by mouth.    . diphenhydramine-acetaminophen (TYLENOL PM) 25-500 MG TABS tablet Take 1-2 tablets by mouth at bedtime as needed (pain).    . ferrous sulfate 324 MG TBEC Take 324 mg by mouth daily with breakfast.    . fluticasone (FLONASE) 50 MCG/ACT nasal spray Place 1 spray into both nostrils daily. 16 g 3  . levothyroxine (SYNTHROID) 175 MCG tablet TAKE 1 TABLET BY MOUTH DAILY BEFORE BREAKFAST. 30 tablet 1  . lidocaine-prilocaine (EMLA) cream Apply 1 application topically as needed. 30  g 1  . methocarbamol (ROBAXIN) 500 MG tablet Take 500 mg by mouth daily.    . Multiple Vitamins-Minerals (PRESERVISION AREDS 2 PO) Take by mouth.    . oxyCODONE-acetaminophen (PERCOCET/ROXICET) 5-325 MG tablet Take 1 tablet by mouth every 8 (eight) hours as needed for severe pain. 15 tablet 0  . senna-docusate (SENNA S) 8.6-50 MG tablet 1 to 2 twice daily for constipation 120 tablet 5   No current facility-administered medications for this visit.    SURGICAL HISTORY:  Past Surgical History:  Procedure Laterality Date  . 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 BELPHAROPTOSIS REPAIR Bilateral   . CATARACT EXTRACTION W/ INTRAOCULAR LENS  IMPLANT, BILATERAL    . COLONOSCOPY N/A 11/03/2014   Procedure: COLONOSCOPY;  Surgeon: Rogene Houston, MD;  Location: [facility];  Service: Endoscopy;  Laterality: N/A;  1225  . IR CV LINE INJECTION  08/24/2020  . IR IMAGING GUIDED PORT INSERTION  07/11/2018  . KNEE CARTILAGE SURGERY Left    Left knee  . SKIN CANCER EXCISION  12/2014, 04/25/15  . VIDEO BRONCHOSCOPY Bilateral 05/09/2018   Procedure: VIDEO BRONCHOSCOPY WITH FLUORO;  Surgeon: Juanito Doom, MD;  Location: [facility];  Service: Cardiopulmonary;  Laterality: Bilateral;    REVIEW OF SYSTEMS:  A comprehensive review of systems was negative except for: Ears, nose, mouth, throat, and face: positive for nasal congestion Respiratory: positive for cough   PHYSICAL EXAMINATION: General appearance: alert, cooperative, fatigued and no distress Head: Normocephalic, without obvious abnormality, atraumatic Neck: no adenopathy, no JVD, supple, symmetrical, trachea midline and thyroid not enlarged, symmetric, no tenderness/mass/nodules Lymph nodes: Cervical, supraclavicular, and axillary nodes normal. Resp: clear to auscultation bilaterally Back: symmetric, no curvature. ROM normal. No CVA tenderness. Cardio: regular rate and rhythm, S1, S2 normal, no murmur, click, rub or gallop GI: soft,  non-tender; bowel sounds normal; no masses,  no organomegaly Extremities: extremities normal, atraumatic, no cyanosis or edema  ECOG PERFORMANCE STATUS: 1 - Symptomatic but completely ambulatory  Blood pressure 133/87, pulse 74, temperature 97.8 F (36.6 C), temperature source Tympanic, resp. rate 17, height 6\' 2"  (1.88 m), weight 254 lb 8 oz (115.4 kg), SpO2 100 %.  LABORATORY DATA: Lab Results  Component Value Date   WBC 5.6 09/07/2020   HGB 12.8 (L) 09/07/2020   HCT 37.4 (L) 09/07/2020   MCV 92.3 09/07/2020   PLT 153 09/07/2020      Chemistry      Component Value Date/Time   NA 141 09/07/2020 0945   NA 144 04/09/2018 1507   K 4.2 09/07/2020 0945   CL 111 09/07/2020 0945   CO2 26 09/07/2020 0945   BUN 14 09/07/2020 0945   BUN 15 04/09/2018 1507   CREATININE 0.96 09/07/2020 0945      Component Value Date/Time   CALCIUM 8.8 (L) 09/07/2020  0945   ALKPHOS 60 09/07/2020 0945   AST 12 (L) 09/07/2020 0945   ALT 13 09/07/2020 0945   BILITOT 0.7 09/07/2020 0945       RADIOGRAPHIC STUDIES: CT Chest W Contrast  Result Date: 08/15/2020 CLINICAL DATA:  Non-small cell lung cancer staging, status post chemo and XRT, ongoing immunotherapy EXAM: CT CHEST, ABDOMEN, AND PELVIS WITH CONTRAST TECHNIQUE: Multidetector CT imaging of the chest, abdomen and pelvis was performed following the standard protocol during bolus administration of intravenous contrast. CONTRAST:  178mL OMNIPAQUE IOHEXOL 300 MG/ML SOLN, additional oral enteric contrast COMPARISON:  05/23/2020 FINDINGS: CT CHEST FINDINGS Cardiovascular: Right chest port catheter. Aortic atherosclerosis. Normal heart size. Three-vessel coronary artery calcifications and/or stents. No pericardial effusion. Mediastinum/Nodes: Unchanged spiculated soft tissue nodule or lymph node in the left axilla measuring 1.9 x 1.2 cm (series 2, image 8). Thyroid gland, trachea, and esophagus demonstrate no significant findings. Lungs/Pleura: Slight  interval enlargement of a spiculated mass of the anterior left upper lobe measuring 3.2 x 2.7 cm, previously 2.9 x 2.4 cm when measured similarly (series 6, image 73). Unchanged fibrotic scarring and volume loss of the perihilar right lung (series 6, image 70). There is a background of innumerable tiny centrilobular pulmonary nodules most concentrated in the upper lungs. No pleural effusion or pneumothorax. Musculoskeletal: No chest wall mass or suspicious bone lesions identified. CT ABDOMEN PELVIS FINDINGS Hepatobiliary: No solid liver abnormality is seen. No gallstones, gallbladder wall thickening, or biliary dilatation. Pancreas: Unremarkable. No pancreatic ductal dilatation or surrounding inflammatory changes. Spleen: Normal in size without significant abnormality. Adrenals/Urinary Tract: Adrenal glands are unremarkable. There is a 3 mm calculus at the right ureterovesicular junction with mild right hydronephrosis and hydroureter and extensive perinephric and periureteral fat stranding (series 2, image 117). Additional small bilateral nonobstructive renal calculi. Bladder is unremarkable. Stomach/Bowel: Stomach is within normal limits. Appendix appears normal. No evidence of bowel wall thickening, distention, or inflammatory changes. Descending and sigmoid diverticulosis. Vascular/Lymphatic: Aortic atherosclerosis. No enlarged abdominal or pelvic lymph nodes. Reproductive: No mass or other abnormality. Other: No abdominal wall hernia or abnormality. No abdominopelvic ascites. Musculoskeletal: No acute or significant osseous findings. IMPRESSION: 1. Slight interval enlargement of a spiculated mass of the anterior left upper lobe, consistent with worsened malignancy. 2. Unchanged post treatment fibrotic scarring and volume loss of the perihilar right lung. 3. Unchanged spiculated soft tissue nodule or lymph node in the left axilla. 4. No evidence of metastatic disease in the abdomen or pelvis. 5. There is a 3 mm  calculus at the right ureterovesicular junction with mild right hydronephrosis and hydroureter and extensive perinephric and periureteral fat stranding. Additional small bilateral nonobstructive renal calculi. 6. Coronary artery disease. Aortic Atherosclerosis (ICD10-I70.0). These results will be called to the ordering clinician or representative by the Radiologist Assistant, and communication documented in the PACS or [employer]. Electronically Signed   By: Eddie Candle M.D.   On: 08/15/2020 10:08   CT Abdomen Pelvis W Contrast  Result Date: 08/15/2020 CLINICAL DATA:  Non-small cell lung cancer staging, status post chemo and XRT, ongoing immunotherapy EXAM: CT CHEST, ABDOMEN, AND PELVIS WITH CONTRAST TECHNIQUE: Multidetector CT imaging of the chest, abdomen and pelvis was performed following the standard protocol during bolus administration of intravenous contrast. CONTRAST:  156mL OMNIPAQUE IOHEXOL 300 MG/ML SOLN, additional oral enteric contrast COMPARISON:  05/23/2020 FINDINGS: CT CHEST FINDINGS Cardiovascular: Right chest port catheter. Aortic atherosclerosis. Normal heart size. Three-vessel coronary artery calcifications and/or stents. No pericardial effusion. Mediastinum/Nodes: Unchanged spiculated  soft tissue nodule or lymph node in the left axilla measuring 1.9 x 1.2 cm (series 2, image 8). Thyroid gland, trachea, and esophagus demonstrate no significant findings. Lungs/Pleura: Slight interval enlargement of a spiculated mass of the anterior left upper lobe measuring 3.2 x 2.7 cm, previously 2.9 x 2.4 cm when measured similarly (series 6, image 73). Unchanged fibrotic scarring and volume loss of the perihilar right lung (series 6, image 70). There is a background of innumerable tiny centrilobular pulmonary nodules most concentrated in the upper lungs. No pleural effusion or pneumothorax. Musculoskeletal: No chest wall mass or suspicious bone lesions identified. CT ABDOMEN PELVIS FINDINGS  Hepatobiliary: No solid liver abnormality is seen. No gallstones, gallbladder wall thickening, or biliary dilatation. Pancreas: Unremarkable. No pancreatic ductal dilatation or surrounding inflammatory changes. Spleen: Normal in size without significant abnormality. Adrenals/Urinary Tract: Adrenal glands are unremarkable. There is a 3 mm calculus at the right ureterovesicular junction with mild right hydronephrosis and hydroureter and extensive perinephric and periureteral fat stranding (series 2, image 117). Additional small bilateral nonobstructive renal calculi. Bladder is unremarkable. Stomach/Bowel: Stomach is within normal limits. Appendix appears normal. No evidence of bowel wall thickening, distention, or inflammatory changes. Descending and sigmoid diverticulosis. Vascular/Lymphatic: Aortic atherosclerosis. No enlarged abdominal or pelvic lymph nodes. Reproductive: No mass or other abnormality. Other: No abdominal wall hernia or abnormality. No abdominopelvic ascites. Musculoskeletal: No acute or significant osseous findings. IMPRESSION: 1. Slight interval enlargement of a spiculated mass of the anterior left upper lobe, consistent with worsened malignancy. 2. Unchanged post treatment fibrotic scarring and volume loss of the perihilar right lung. 3. Unchanged spiculated soft tissue nodule or lymph node in the left axilla. 4. No evidence of metastatic disease in the abdomen or pelvis. 5. There is a 3 mm calculus at the right ureterovesicular junction with mild right hydronephrosis and hydroureter and extensive perinephric and periureteral fat stranding. Additional small bilateral nonobstructive renal calculi. 6. Coronary artery disease. Aortic Atherosclerosis (ICD10-I70.0). These results will be called to the ordering clinician or representative by the Radiologist Assistant, and communication documented in the PACS or [employer]. Electronically Signed   By: Eddie Candle M.D.   On: 08/15/2020 10:08    IR CV Line Injection  Result Date: 08/24/2020 CLINICAL DATA:  History of squamous cell carcinoma of the lung and status post Port-A-Cath placement on 07/11/2018. There has been some recent difficulty with aspiration of blood from the port requiring tPA dwells. EXAM: CONTRAST INJECTION OF PORT A CATH UNDER FLUOROSCOPY CONTRAST:  10 mL Omnipaque 300 FLUOROSCOPY TIME:  18 seconds.  8.3 mGy. PROCEDURE: Contrast was administered via the indwelling port after it was accessed. Fluoroscopic spot images were obtained of the catheter during injection. FINDINGS: After access of the port, there was instant return of blood with aspiration and the port flushed easily. With contrast injection the port reservoir is patent and the tubing intact without evidence of extravasation of contrast. Contrast flows freely from the catheter tip located at the SVC/RA junction with no evidence of significant fibrin sheath material or occlusion. No adherent peri-catheter thrombus is identified. IMPRESSION: Unremarkable port injection demonstrating no evidence of catheter occlusion or contrast extravasation. Contrast flows freely from the catheter tip with no evidence of significant fibrin sheath or adherent thrombus. Electronically Signed   By: Aletta Edouard M.D.   On: 08/24/2020 15:56    ASSESSMENT AND PLAN: This is a very pleasant 74 years old white male with very light most smoking history recently diagnosed with stage IV (  T3, N0, M1c) non-small cell lung cancer, squamous cell carcinoma based on the biopsy from the left axillary mass. He status post 4 cycles of induction systemic chemotherapy with carboplatin, paclitaxel and Keytruda with partial response.  The patient is currently on maintenance treatment with single agent Keytruda status post 37 cycles. The patient continues to tolerate his treatment well with no concerning adverse effect except for the nasal congestion and cough. I recommended for him to proceed with cycle  #38 of his maintenance therapy today as planned. He will come back for follow-up visit in 3 weeks for evaluation before the next cycle of his treatment. For the nasal congestion and persistent cough, he will see a pulmonologist for evaluation. For the hypothyroidism, I will increase his dose of levothyroxine to 175 mcg p.o. daily because of the elevated TSH. He was advised to call immediately if he has any other concerning symptoms in the interval. The patient voices understanding of current disease status and treatment options and is in agreement with the current care plan. All questions were answered. The patient knows to call the clinic with any problems, questions or concerns. We can certainly see the patient much sooner if necessary.  Disclaimer: This note was dictated with voice recognition software. Similar sounding words can inadvertently be transcribed and may not be corrected upon review.

## 2020-09-07 NOTE — Patient Instructions (Signed)
[facility] Discharge Instructions for Patients Receiving Chemotherapy  Today you received the following chemotherapy agents: Pembrolizumab Beryle Flock)  To help prevent nausea and vomiting after your treatment, we encourage you to take your nausea medication  as prescribed.    If you develop nausea and vomiting that is not controlled by your nausea medication, call the clinic.   BELOW ARE SYMPTOMS THAT SHOULD BE REPORTED IMMEDIATELY:  *FEVER GREATER THAN 100.5 F  *CHILLS WITH OR WITHOUT FEVER  NAUSEA AND VOMITING THAT IS NOT CONTROLLED WITH YOUR NAUSEA MEDICATION  *UNUSUAL SHORTNESS OF BREATH  *UNUSUAL BRUISING OR BLEEDING  TENDERNESS IN MOUTH AND THROAT WITH OR WITHOUT PRESENCE OF ULCERS  *URINARY PROBLEMS  *BOWEL PROBLEMS  UNUSUAL RASH Items with * indicate a potential emergency and should be followed up as soon as possible.  Feel free to call the clinic should you have any questions or concerns. The clinic phone number is [phone].  Please show the [facility] at check-in to the Emergency Department and triage nurse.

## 2020-09-08 ENCOUNTER — Telehealth: Payer: Self-pay | Admitting: Internal Medicine

## 2020-09-08 NOTE — Telephone Encounter (Signed)
Scheduled additional round of chemo per 12/29 los. Patient is aware. Patient stated he prefers Tuesday appointments vs. Wednesdays.

## 2020-09-12 ENCOUNTER — Other Ambulatory Visit: Payer: Self-pay | Admitting: Internal Medicine

## 2020-09-12 DIAGNOSIS — C3491 Malignant neoplasm of unspecified part of right bronchus or lung: Secondary | ICD-10-CM

## 2020-09-20 ENCOUNTER — Ambulatory Visit (INDEPENDENT_AMBULATORY_CARE_PROVIDER_SITE_OTHER): Payer: Medicare Other | Admitting: Pulmonary Disease

## 2020-09-20 ENCOUNTER — Other Ambulatory Visit: Payer: Self-pay

## 2020-09-20 ENCOUNTER — Encounter: Payer: Self-pay | Admitting: Pulmonary Disease

## 2020-09-20 VITALS — BP 118/64 | HR 41 | Temp 97.8°F | Ht 74.0 in | Wt 256.4 lb

## 2020-09-20 DIAGNOSIS — J42 Unspecified chronic bronchitis: Secondary | ICD-10-CM

## 2020-09-20 DIAGNOSIS — C3491 Malignant neoplasm of unspecified part of right bronchus or lung: Secondary | ICD-10-CM | POA: Diagnosis not present

## 2020-09-20 DIAGNOSIS — D849 Immunodeficiency, unspecified: Secondary | ICD-10-CM

## 2020-09-20 DIAGNOSIS — Z923 Personal history of irradiation: Secondary | ICD-10-CM | POA: Diagnosis not present

## 2020-09-20 DIAGNOSIS — R059 Cough, unspecified: Secondary | ICD-10-CM

## 2020-09-20 MED ORDER — TRELEGY ELLIPTA 100-62.5-25 MCG/INH IN AEPB: 1.0000 | INHALATION_SPRAY | Freq: Every day | RESPIRATORY_TRACT | 6 refills | Status: DC

## 2020-09-20 MED ORDER — BENZONATATE 200 MG PO CAPS: 200.0000 mg | ORAL_CAPSULE | Freq: Three times a day (TID) | ORAL | 1 refills | Status: DC | PRN

## 2020-09-20 MED ORDER — TRELEGY ELLIPTA 100-62.5-25 MCG/INH IN AEPB: 1.0000 | INHALATION_SPRAY | Freq: Every day | RESPIRATORY_TRACT | 0 refills | Status: DC

## 2020-09-20 MED ORDER — ALBUTEROL SULFATE (2.5 MG/3ML) 0.083% IN NEBU: 2.5000 mg | INHALATION_SOLUTION | Freq: Four times a day (QID) | RESPIRATORY_TRACT | 11 refills | Status: DC | PRN

## 2020-09-20 NOTE — Progress Notes (Signed)
Inhaler instruction:  Used a placebo inhaler device to instruct patient on new inhaler technique. Patient was able to demonstrate proper inhaler technique.  [facility] 09/20/2020 11:13 AM

## 2020-09-20 NOTE — Patient Instructions (Addendum)
Thank you for visiting Dr. Valeta Harms at [facility]. Today we recommend the following:  Samples of Trelegy today Instructed on how to use it.   Meds ordered this encounter  Medications  . albuterol (PROVENTIL) (2.5 MG/3ML) 0.083% nebulizer solution    Sig: Inhale 3 mLs (2.5 mg total) into the lungs every 6 (six) hours as needed for wheezing or shortness of breath.    Dispense:  75 mL    Refill:  11  . Fluticasone-Umeclidin-Vilant (TRELEGY ELLIPTA) 100-62.5-25 MCG/INH AEPB    Sig: Inhale 1 puff into the lungs daily.    Dispense:  60 each    Refill:  6  . benzonatate (TESSALON) 200 MG capsule    Sig: Take 1 capsule (200 mg total) by mouth 3 (three) times daily as needed for cough.    Dispense:  120 capsule    Refill:  1   Return in about 6 months (around 03/20/2021), or if symptoms worsen or fail to improve, for with APP or Dr. Valeta Harms.    Please do your part to reduce the spread of COVID-19.

## 2020-09-20 NOTE — Progress Notes (Signed)
Synopsis: Referred in January 2022 for lung cancer by [name], DO  Subjective:   PATIENT ID: Travis Padilla: male DOB: 04-Dec-1945, MRN: [id number]  Chief Complaint  Patient presents with  . Consult    Establish care with BI today for lung cancer    This is a 75 year old with a history of metastatic lung cancer, stage IV squamous cell carcinoma of the lung.,  Coronary artery disease, hypertension hyperlipidemia.  Former smoker quit in 1970.  Patient was formally established with Dr. Lake Bells in 2019.  Last seen in our office in October 2021 by Wyn Quaker, NP.  Pulmonary function test with no significant obstruction.  Last office visit with oncology in October 2021.  Patient had stage IV T3, N0, M1 C non-small cell lung cancer based off a biopsy from left axillary mass.  He had 4 cycles of induction chemotherapy with carboplatinum plus paclitaxel followed by Beryle Flock.  Last office visit treated for sinusitis.  Recommending follow-up and establish care with myself after last office visit with lung cancer history diagnosis.  OV 09/20/2020: Complains today of ongoing cough and sputum production.  He had pulmonary function test in the past with no significant obstruction but he has had radiation still has a mass present within the chest longstanding history of smoking.  Has ongoing symptoms consistent with chronic bronchitis.  Likely has issues regarding this has not been on longstanding bronchodilators for any period of time.  Uses over-the-counter cough medications as well as Flonase for nasal congestion.  He has been evaluated by ENT in the past    Past Medical History:  Diagnosis Date  . Abnormal nuclear stress test    December, 2013  . Anemia   . Axillary adenopathy 04/24/2018  . CAD (coronary artery disease)    Mild nonobstructive plaque in cath 2013  . Chest pain    December, 2013  . Cough with hemoptysis 04/22/2018  . Dizziness   . Dyslipidemia 07/14/2019  . Encounter  for antineoplastic immunotherapy 05/13/2018  . Gout   . Hemorrhoid   . Hyperlipidemia   . Hypertension   . Hypothyroidism (acquired) 08/11/2018  . Metastasis to lung ([place]) 05/19/2018  . 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) ([place]) dx'd 04/17/18   to LN, infrahilar mass, lung nodule and lt axilla  . Obesity, unspecified 03/28/2009   Qualifier: Diagnosis of  By: [name], MD, [name]    . Palpitations 03/28/2009   Qualifier: Diagnosis of  By: [name], MD, [name]    . Right lower lobe lung mass 04/22/2018  . Skin cancer   . SNORING 03/28/2009   Qualifier: Diagnosis of  By: [name], MD, [name]    . Spinal stenosis   . 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[place]) 05/13/2018     Family History  Problem Relation Age of Onset  . Heart attack Father   . Heart failure Mother   . Parkinson's disease Mother   . Healthy Sister   . Skin cancer Sister   . Neuropathy Brother   . COPD Brother   . Epilepsy Brother   . Healthy Sister   . Healthy Sister   . Colon cancer Neg Hx      Past Surgical History:  Procedure Laterality Date  . 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 BELPHAROPTOSIS REPAIR Bilateral   . CATARACT EXTRACTION W/ INTRAOCULAR LENS  IMPLANT, BILATERAL    . COLONOSCOPY N/A 11/03/2014  Procedure: COLONOSCOPY;  Surgeon: Rogene Houston, MD;  Location: [facility];  Service: Endoscopy;  Laterality: N/A;  1225  . IR CV LINE INJECTION  08/24/2020  . IR IMAGING GUIDED PORT INSERTION  07/11/2018  . KNEE CARTILAGE SURGERY Left    Left knee  . SKIN CANCER EXCISION  12/2014, 04/25/15  . VIDEO BRONCHOSCOPY Bilateral 05/09/2018   Procedure: VIDEO BRONCHOSCOPY WITH FLUORO;  Surgeon: Juanito Doom, MD;  Location: [facility];  Service: Cardiopulmonary;  Laterality: Bilateral;    Social History   Socioeconomic History  . Marital status: Married    Spouse name: Not on file  . Number of children: 1  . Years of education: Not on file  . Highest  education level: Not on file  Occupational History  . Occupation: Government social research officer  Tobacco Use  . Smoking status: Former Smoker    Packs/day: 0.20    Years: 2.00    Pack years: 0.40    Types: Cigarettes    Start date: 10/01/1968  . Smokeless tobacco: Never Used  Vaping Use  . Vaping Use: Never used  Substance and Sexual Activity  . Alcohol use: No    Comment: hx of   . Drug use: No  . Sexual activity: Not on file  Other Topics Concern  . Not on file  Social History Narrative   Unable to ask intimate partner violence questions, wife present   Social Determinants of Health   Financial Resource Strain: Not on file  Food Insecurity: Not on file  Transportation Needs: Not on file  Physical Activity: Not on file  Stress: Not on file  Social Connections: Not on file  Intimate Partner Violence: Not on file     Allergies  Allergen Reactions  . Pravastatin     Numbness in feet   . Iodine Hives and Rash    CT scan contrast caused rash and itching      Outpatient Medications Prior to Visit  Medication Sig Dispense Refill  . Alirocumab (PRALUENT) 150 MG/ML SOAJ Inject 150 mg into the skin every 14 (fourteen) days. 2 pen 11  . Artificial Tear Solution (SOOTHE XP OP) Place 1 drop into both eyes 2 (two) times daily.    . carvedilol (COREG) 12.5 MG tablet Take 1 tablet (12.5 mg total) by mouth in the morning, at noon, and at bedtime. 270 tablet 3  . Cyanocobalamin (VITAMIN B 12) 250 MCG LOZG Take by mouth.    . diphenhydramine-acetaminophen (TYLENOL PM) 25-500 MG TABS tablet Take 1-2 tablets by mouth at bedtime as needed (pain).    . ferrous sulfate 324 MG TBEC Take 324 mg by mouth daily with breakfast.    . fluticasone (FLONASE) 50 MCG/ACT nasal spray Place 1 spray into both nostrils daily. 16 g 3  . guaiFENesin-dextromethorphan (ROBITUSSIN DM) 100-10 MG/5ML syrup Take 5 mLs by mouth every 4 (four) hours as needed for cough.    . levothyroxine (SYNTHROID) 175 MCG tablet TAKE 1  TABLET BY MOUTH EVERY DAY BEFORE BREAKFAST 30 tablet 1  . methocarbamol (ROBAXIN) 500 MG tablet Take 500 mg by mouth daily.    . Multiple Vitamins-Minerals (PRESERVISION AREDS 2 PO) Take by mouth.    . oxyCODONE-acetaminophen (PERCOCET/ROXICET) 5-325 MG tablet Take 1 tablet by mouth every 8 (eight) hours as needed for severe pain. 15 tablet 0  . senna-docusate (SENNA S) 8.6-50 MG tablet 1 to 2 twice daily for constipation 120 tablet 5  . albuterol (PROVENTIL) (2.5 MG/3ML) 0.083% nebulizer  solution Inhale into the lungs.    Marland Kitchen azelastine (ASTELIN) 0.1 % nasal spray Place 1 spray into both nostrils 2 (two) times daily. (Patient not taking: Reported on 09/20/2020) 30 mL 12  . lidocaine-prilocaine (EMLA) cream Apply 1 application topically as needed. (Patient not taking: Reported on 09/20/2020) 30 g 1   No facility-administered medications prior to visit.    Review of Systems  Constitutional: Negative for chills, fever, malaise/fatigue and weight loss.  HENT: Negative for hearing loss, sore throat and tinnitus.   Eyes: Negative for blurred vision and double vision.  Respiratory: Positive for cough, sputum production and shortness of breath. Negative for hemoptysis, wheezing and stridor.   Cardiovascular: Negative for chest pain, palpitations, orthopnea, leg swelling and PND.  Gastrointestinal: Negative for abdominal pain, constipation, diarrhea, heartburn, nausea and vomiting.  Genitourinary: Negative for dysuria, hematuria and urgency.  Musculoskeletal: Negative for joint pain and myalgias.  Skin: Negative for itching and rash.  Neurological: Negative for dizziness, tingling, weakness and headaches.  Endo/Heme/Allergies: Negative for environmental allergies. Does not bruise/bleed easily.  Psychiatric/Behavioral: Negative for depression. The patient is not nervous/anxious and does not have insomnia.   All other systems reviewed and are negative.    Objective:  Physical Exam Vitals reviewed.   Constitutional:      General: He is not in acute distress.    Appearance: He is well-developed and well-nourished. He is obese.  HENT:     Head: Normocephalic and atraumatic.     Mouth/Throat:     Mouth: Oropharynx is clear and moist.  Eyes:     General: No scleral icterus.    Conjunctiva/sclera: Conjunctivae normal.     Pupils: Pupils are equal, round, and reactive to light.  Neck:     Vascular: No JVD.     Trachea: No tracheal deviation.  Cardiovascular:     Rate and Rhythm: Normal rate and regular rhythm.     Pulses: Intact distal pulses.     Heart sounds: Normal heart sounds. No murmur heard.   Pulmonary:     Effort: Pulmonary effort is normal. No tachypnea, accessory muscle usage or respiratory distress.     Breath sounds: No stridor. No wheezing, rhonchi or rales.  Abdominal:     General: Bowel sounds are normal. There is no distension.     Palpations: Abdomen is soft.     Tenderness: There is no abdominal tenderness.  Musculoskeletal:        General: No tenderness or edema.     Cervical back: Neck supple.  Lymphadenopathy:     Cervical: No cervical adenopathy.  Skin:    General: Skin is warm and dry.     Capillary Refill: Capillary refill takes less than 2 seconds.     Findings: No rash.  Neurological:     Mental Status: He is alert and oriented to person, place, and time.  Psychiatric:        Mood and Affect: Mood and affect normal.        Behavior: Behavior normal.      Vitals:   09/20/20 1052  BP: 118/64  Pulse: (!) 41  Temp: 97.8 F (36.6 C)  TempSrc: Tympanic  SpO2: 96%  Weight: 256 lb 6 oz (116.3 kg)  Height: 6\' 2"  (1.88 m)   96% on RA BMI Readings from Last 3 Encounters:  09/20/20 32.92 kg/m  09/07/20 32.68 kg/m  08/16/20 32.03 kg/m   Wt Readings from Last 3 Encounters:  09/20/20 256 lb 6 oz (  116.3 kg)  09/07/20 254 lb 8 oz (115.4 kg)  08/16/20 249 lb 8 oz (113.2 kg)     CBC    Component Value Date/Time   WBC 5.6 09/07/2020  0945   WBC 12.0 (H) 08/14/2020 1930   RBC 4.05 (L) 09/07/2020 0945   HGB 12.8 (L) 09/07/2020 0945   HGB 13.0 12/04/2017 1202   HCT 37.4 (L) 09/07/2020 0945   HCT 39.6 12/04/2017 1202   PLT 153 09/07/2020 0945   PLT 218 12/04/2017 1202   MCV 92.3 09/07/2020 0945   MCV 92 12/04/2017 1202   MCH 31.6 09/07/2020 0945   MCHC 34.2 09/07/2020 0945   RDW 12.3 09/07/2020 0945   RDW 13.8 12/04/2017 1202   LYMPHSABS 1.0 09/07/2020 0945   LYMPHSABS 1.8 12/04/2017 1202   MONOABS 0.7 09/07/2020 0945   EOSABS 0.3 09/07/2020 0945   EOSABS 0.2 12/04/2017 1202   BASOSABS 0.1 09/07/2020 0945   BASOSABS 0.0 12/04/2017 1202    Chest Imaging: 08/15/2020 CT chest: Interval enlargement of the anterior left upper lobe mass looks like its growing in size worsened malignancy in this location. The patient's images have been independently reviewed by me.    Pulmonary Functions Testing Results: PFT Results Latest Ref Rng & Units 05/30/2018  FVC-Pre L 4.40  FVC-Predicted Pre % 93  FVC-Post L 4.26  FVC-Predicted Post % 90  Pre FEV1/FVC % % 80  Post FEV1/FCV % % 80  FEV1-Pre L 3.52  FEV1-Predicted Pre % 101  FEV1-Post L 3.40  DLCO uncorrected ml/min/mmHg 25.71  DLCO UNC% % 73  DLCO corrected ml/min/mmHg 27.59  DLCO COR %Predicted % 78  DLVA Predicted % 86    FeNO:   Pathology:   Echocardiogram:   Heart Catheterization:     Assessment & Plan:     ICD-10-CM   1. 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HCC)  C34.91   2. Cough  R05.9   3. Chronic bronchitis, unspecified chronic bronchitis type ([place])  J42   4. History of radiation therapy  Z92.3   5. Immunosuppressed status ([place])  D84.9     Discussion:  This is a 75 year old gentleman with stage IV squamous cell carcinoma of the lung status post combined chemotherapy plus radiation now on maintenance Keytruda.  Recent CT imaging suggestive of enlargement or worsening of the left-sided lung mass.  He also has symptoms of chronic  bronchitis cough.  I think that his cough is likely related to his tumor within the airway as well as his history of radiation to treatments.  He has chronic bronchitis potentially also associated with COPD.  His PFTs are hard to interpret at this point.  Set up like repeating them would be helpful.  Plan: Given samples of Trelegy inhaler to help with bronchitis symptoms. I think long-acting bronchodilators and steroids will help him breathe better and potentially cough less. Tessalon Perles also sent to help suppress cough. Continue to use his over-the-counter medications as needed. Continue Flonase Continue albuterol for shortness of breath and wheezing. Refills given today.    Current Outpatient Medications:  .  Alirocumab (PRALUENT) 150 MG/ML SOAJ, Inject 150 mg into the skin every 14 (fourteen) days., Disp: 2 pen, Rfl: 11 .  Artificial Tear Solution (SOOTHE XP OP), Place 1 drop into both eyes 2 (two) times daily., Disp: , Rfl:  .  carvedilol (COREG) 12.5 MG tablet, Take 1 tablet (12.5 mg total) by mouth in the morning, at noon, and at bedtime., Disp:  270 tablet, Rfl: 3 .  Cyanocobalamin (VITAMIN B 12) 250 MCG LOZG, Take by mouth., Disp: , Rfl:  .  diphenhydramine-acetaminophen (TYLENOL PM) 25-500 MG TABS tablet, Take 1-2 tablets by mouth at bedtime as needed (pain)., Disp: , Rfl:  .  ferrous sulfate 324 MG TBEC, Take 324 mg by mouth daily with breakfast., Disp: , Rfl:  .  fluticasone (FLONASE) 50 MCG/ACT nasal spray, Place 1 spray into both nostrils daily., Disp: 16 g, Rfl: 3 .  Fluticasone-Umeclidin-Vilant (TRELEGY ELLIPTA) 100-62.5-25 MCG/INH AEPB, Inhale 1 puff into the lungs daily., Disp: 60 each, Rfl: 6 .  guaiFENesin-dextromethorphan (ROBITUSSIN DM) 100-10 MG/5ML syrup, Take 5 mLs by mouth every 4 (four) hours as needed for cough., Disp: , Rfl:  .  levothyroxine (SYNTHROID) 175 MCG tablet, TAKE 1 TABLET BY MOUTH EVERY DAY BEFORE BREAKFAST, Disp: 30 tablet, Rfl: 1 .  methocarbamol  (ROBAXIN) 500 MG tablet, Take 500 mg by mouth daily., Disp: , Rfl:  .  Multiple Vitamins-Minerals (PRESERVISION AREDS 2 PO), Take by mouth., Disp: , Rfl:  .  oxyCODONE-acetaminophen (PERCOCET/ROXICET) 5-325 MG tablet, Take 1 tablet by mouth every 8 (eight) hours as needed for severe pain., Disp: 15 tablet, Rfl: 0 .  senna-docusate (SENNA S) 8.6-50 MG tablet, 1 to 2 twice daily for constipation, Disp: 120 tablet, Rfl: 5 .  albuterol (PROVENTIL) (2.5 MG/3ML) 0.083% nebulizer solution, Inhale 3 mLs (2.5 mg total) into the lungs every 6 (six) hours as needed for wheezing or shortness of breath., Disp: 75 mL, Rfl: 11 .  azelastine (ASTELIN) 0.1 % nasal spray, Place 1 spray into both nostrils 2 (two) times daily. (Patient not taking: Reported on 09/20/2020), Disp: 30 mL, Rfl: 12 .  lidocaine-prilocaine (EMLA) cream, Apply 1 application topically as needed. (Patient not taking: Reported on 09/20/2020), Disp: 30 g, Rfl: 1  I spent 30 minutes dedicated to the care of this patient on the date of this encounter to include pre-visit review of records, face-to-face time with the patient discussing conditions above, post visit ordering of testing, clinical documentation with the electronic health record, making appropriate referrals as documented, and communicating necessary findings to members of the patients care team.   Garner Nash, DO [facility] 09/20/2020 11:03 AM

## 2020-09-27 ENCOUNTER — Ambulatory Visit: Payer: Medicare Other | Admitting: Pulmonary Disease

## 2020-09-27 ENCOUNTER — Other Ambulatory Visit: Payer: Self-pay | Admitting: Physician Assistant

## 2020-09-27 DIAGNOSIS — C3491 Malignant neoplasm of unspecified part of right bronchus or lung: Secondary | ICD-10-CM

## 2020-09-27 DIAGNOSIS — R5383 Other fatigue: Secondary | ICD-10-CM

## 2020-09-28 ENCOUNTER — Inpatient Hospital Stay: Payer: Medicare Other

## 2020-09-28 ENCOUNTER — Other Ambulatory Visit: Payer: Self-pay

## 2020-09-28 ENCOUNTER — Inpatient Hospital Stay: Payer: Medicare Other | Attending: Oncology | Admitting: Internal Medicine

## 2020-09-28 ENCOUNTER — Encounter: Payer: Self-pay | Admitting: Internal Medicine

## 2020-09-28 VITALS — BP 154/92 | HR 73 | Temp 97.5°F | Resp 17 | Ht 74.0 in | Wt 257.7 lb

## 2020-09-28 DIAGNOSIS — R059 Cough, unspecified: Secondary | ICD-10-CM | POA: Insufficient documentation

## 2020-09-28 DIAGNOSIS — Z95828 Presence of other vascular implants and grafts: Secondary | ICD-10-CM

## 2020-09-28 DIAGNOSIS — C3412 Malignant neoplasm of upper lobe, left bronchus or lung: Secondary | ICD-10-CM | POA: Insufficient documentation

## 2020-09-28 DIAGNOSIS — E039 Hypothyroidism, unspecified: Secondary | ICD-10-CM | POA: Diagnosis not present

## 2020-09-28 DIAGNOSIS — C3401 Malignant neoplasm of right main bronchus: Secondary | ICD-10-CM | POA: Diagnosis not present

## 2020-09-28 DIAGNOSIS — E785 Hyperlipidemia, unspecified: Secondary | ICD-10-CM | POA: Diagnosis not present

## 2020-09-28 DIAGNOSIS — I10 Essential (primary) hypertension: Secondary | ICD-10-CM | POA: Diagnosis not present

## 2020-09-28 DIAGNOSIS — C3491 Malignant neoplasm of unspecified part of right bronchus or lung: Secondary | ICD-10-CM | POA: Diagnosis not present

## 2020-09-28 DIAGNOSIS — R0989 Other specified symptoms and signs involving the circulatory and respiratory systems: Secondary | ICD-10-CM | POA: Insufficient documentation

## 2020-09-28 DIAGNOSIS — C773 Secondary and unspecified malignant neoplasm of axilla and upper limb lymph nodes: Secondary | ICD-10-CM | POA: Diagnosis not present

## 2020-09-28 DIAGNOSIS — Z5112 Encounter for antineoplastic immunotherapy: Secondary | ICD-10-CM | POA: Diagnosis not present

## 2020-09-28 DIAGNOSIS — Z85828 Personal history of other malignant neoplasm of skin: Secondary | ICD-10-CM | POA: Diagnosis not present

## 2020-09-28 DIAGNOSIS — Z79899 Other long term (current) drug therapy: Secondary | ICD-10-CM | POA: Insufficient documentation

## 2020-09-28 DIAGNOSIS — I2510 Atherosclerotic heart disease of native coronary artery without angina pectoris: Secondary | ICD-10-CM | POA: Diagnosis not present

## 2020-09-28 DIAGNOSIS — R0981 Nasal congestion: Secondary | ICD-10-CM | POA: Insufficient documentation

## 2020-09-28 DIAGNOSIS — R5383 Other fatigue: Secondary | ICD-10-CM

## 2020-09-28 LAB — CMP (CANCER CENTER ONLY)
ALT: 10 U/L (ref 0–44)
AST: 15 U/L (ref 15–41)
Albumin: 3.7 g/dL (ref 3.5–5.0)
Alkaline Phosphatase: 58 U/L (ref 38–126)
Anion gap: 7 (ref 5–15)
BUN: 13 mg/dL (ref 8–23)
CO2: 25 mmol/L (ref 22–32)
Calcium: 8.9 mg/dL (ref 8.9–10.3)
Chloride: 108 mmol/L (ref 98–111)
Creatinine: 0.97 mg/dL (ref 0.61–1.24)
GFR, Estimated: >60 mL/min (ref >60)
Glucose, Bld: 103 mg/dL — ABNORMAL HIGH (ref 70–99)
Potassium: 4.2 mmol/L (ref 3.5–5.1)
Sodium: 140 mmol/L (ref 135–145)
Total Bilirubin: 0.6 mg/dL (ref 0.3–1.2)
Total Protein: 7.3 g/dL (ref 6.5–8.1)

## 2020-09-28 LAB — CBC WITH DIFFERENTIAL (CANCER CENTER ONLY)
Abs Immature Granulocytes: 0.03 10*3/uL (ref 0.00–0.07)
Basophils Absolute: 0.1 10*3/uL (ref 0.0–0.1)
Basophils Relative: 1 %
Eosinophils Absolute: 0.4 10*3/uL (ref 0.0–0.5)
Eosinophils Relative: 6 %
HCT: 37.3 % — ABNORMAL LOW (ref 39.0–52.0)
Hemoglobin: 13.3 g/dL (ref 13.0–17.0)
Immature Granulocytes: 1 %
Lymphocytes Relative: 19 %
Lymphs Abs: 1.1 10*3/uL (ref 0.7–4.0)
MCH: 32.5 pg (ref 26.0–34.0)
MCHC: 35.7 g/dL (ref 30.0–36.0)
MCV: 91.2 fL (ref 80.0–100.0)
Monocytes Absolute: 0.8 10*3/uL (ref 0.1–1.0)
Monocytes Relative: 13 %
Neutro Abs: 3.4 10*3/uL (ref 1.7–7.7)
Neutrophils Relative %: 60 %
Platelet Count: 153 10*3/uL (ref 150–400)
RBC: 4.09 MIL/uL — ABNORMAL LOW (ref 4.22–5.81)
RDW: 11.9 % (ref 11.5–15.5)
WBC Count: 5.7 10*3/uL (ref 4.0–10.5)
nRBC: 0 % (ref 0.0–0.2)

## 2020-09-28 LAB — TSH: TSH: 0.255 u[IU]/mL — ABNORMAL LOW (ref 0.320–4.118)

## 2020-09-28 MED ORDER — SODIUM CHLORIDE 0.9 % IV SOLN
200.0000 mg | Freq: Once | INTRAVENOUS | Status: AC
  Administered 2020-09-28: 200 mg via INTRAVENOUS
  Filled 2020-09-28: qty 8

## 2020-09-28 MED ORDER — SODIUM CHLORIDE 0.9 % IV SOLN
Freq: Once | INTRAVENOUS | Status: AC
  Filled 2020-09-28: qty 250

## 2020-09-28 MED ORDER — SODIUM CHLORIDE 0.9% FLUSH
10.0000 mL | INTRAVENOUS | Status: DC | PRN
  Administered 2020-09-28: 10 mL
  Filled 2020-09-28: qty 10

## 2020-09-28 MED ORDER — HEPARIN SOD (PORK) LOCK FLUSH 100 UNIT/ML IV SOLN
500.0000 [IU] | Freq: Once | INTRAVENOUS | Status: AC | PRN
  Administered 2020-09-28: 500 [IU]
  Filled 2020-09-28: qty 5

## 2020-09-28 NOTE — Progress Notes (Signed)
[place] [phone]   [phone]  OFFICE PROGRESS NOTE  [name], DO [place] [address]  DIAGNOSIS: Stage IV (T3, N0, M1c) non-small cell lung cancer, squamous cell carcinoma presented with large right infrahilar mass in addition to left upper lobe lung nodule as well as left axillary mass with left axillary lymph node diagnosed in August 2019.  PRIOR THERAPY:  1) Palliative radiotherapy to the right infrahilar mass as well as the axillary mass under the care of Dr. Lisbeth Renshaw. 2) Systemic chemotherapy with carboplatin for AUC of 5, paclitaxel 175 mg/M2 and Keytruda 200 mg IV every 3 weeks status post 4 cycles.  CURRENT THERAPY: Maintenance immunotherapy with single agent Keytruda 200 mg IV every 3 weeks status post 38 cycles.  INTERVAL HISTORY: Travis Padilla 75 y.o. male returns to the clinic today for follow-up visit.  The patient is feeling fine today with no concerning complaints except for the chest congestion and cough.  He was seen by Dr. Valeta Harms recently and started on treatment with Trelegyas well as albuterol.  He felt a little bit better but has a flare few days ago.  He denied having any current chest pain, shortness of breath septa with exertion and no hemoptysis.  He denied having any recent weight loss or night sweats.  He has no nausea, vomiting, diarrhea or constipation.  He denied having any headache or visual changes.  He continues to tolerate his treatment with Keytruda fairly well.  The patient is here today for evaluation before starting cycle #43 of his treatment.    MEDICAL HISTORY: Past Medical History:  Diagnosis Date  . Abnormal nuclear stress test    December, 2013  . Anemia   . Axillary adenopathy 04/24/2018  . CAD (coronary artery disease)    Mild nonobstructive plaque in cath 2013  . Chest pain    December, 2013  . Cough with hemoptysis 04/22/2018  . Dizziness   . Dyslipidemia 07/14/2019  .  Encounter for antineoplastic immunotherapy 05/13/2018  . Gout   . Hemorrhoid   . Hyperlipidemia   . Hypertension   . Hypothyroidism (acquired) 08/11/2018  . 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 Obesity, unspecified 03/28/2009   Qualifier: Diagnosis of  By: [name], MD, [name]    . Palpitations 03/28/2009   Qualifier: Diagnosis of  By: [name], MD, [name]    . Right lower lobe lung mass 04/22/2018  . Skin cancer   . SNORING 03/28/2009   Qualifier: Diagnosis of  By: [name], MD, [name]    . Spinal stenosis   . 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[place]) 05/13/2018    ALLERGIES:  is allergic to pravastatin and iodine.  MEDICATIONS:  Current Outpatient Medications  Medication Sig Dispense Refill  . albuterol (PROVENTIL) (2.5 MG/3ML) 0.083% nebulizer solution Inhale 3 mLs (2.5 mg total) into the lungs every 6 (six) hours as needed for wheezing or shortness of breath. 75 mL 11  . Alirocumab (PRALUENT) 150 MG/ML SOAJ Inject 150 mg into the skin every 14 (fourteen) days. 2 pen 11  . Artificial Tear Solution (SOOTHE XP OP) Place 1 drop into both eyes 2 (two) times daily.    Marland Kitchen azelastine (ASTELIN) 0.1 % nasal spray Place 1 spray into both nostrils 2 (  two) times daily. (Patient not taking: Reported on 09/20/2020) 30 mL 12  . benzonatate (TESSALON) 200 MG capsule Take 1 capsule (200 mg total) by mouth 3 (three) times daily as needed for cough. 120 capsule 1  . carvedilol (COREG) 12.5 MG tablet Take 1 tablet (12.5 mg total) by mouth in the morning, at noon, and at bedtime. 270 tablet 3  . Cyanocobalamin (VITAMIN B 12) 250 MCG LOZG Take by mouth.    . diphenhydramine-acetaminophen (TYLENOL PM) 25-500 MG TABS tablet Take 1-2 tablets by mouth at bedtime as needed (pain).    . ferrous sulfate 324 MG TBEC Take 324 mg by mouth daily with breakfast.    .  fluticasone (FLONASE) 50 MCG/ACT nasal spray Place 1 spray into both nostrils daily. 16 g 3  . Fluticasone-Umeclidin-Vilant (TRELEGY ELLIPTA) 100-62.5-25 MCG/INH AEPB Inhale 1 puff into the lungs daily. 60 each 6  . Fluticasone-Umeclidin-Vilant (TRELEGY ELLIPTA) 100-62.5-25 MCG/INH AEPB Inhale 1 puff into the lungs daily. 2 each 0  . guaiFENesin-dextromethorphan (ROBITUSSIN DM) 100-10 MG/5ML syrup Take 5 mLs by mouth every 4 (four) hours as needed for cough.    . levothyroxine (SYNTHROID) 175 MCG tablet TAKE 1 TABLET BY MOUTH EVERY DAY BEFORE BREAKFAST 30 tablet 1  . lidocaine-prilocaine (EMLA) cream Apply 1 application topically as needed. (Patient not taking: Reported on 09/20/2020) 30 g 1  . methocarbamol (ROBAXIN) 500 MG tablet Take 500 mg by mouth daily.    . Multiple Vitamins-Minerals (PRESERVISION AREDS 2 PO) Take by mouth.    . oxyCODONE-acetaminophen (PERCOCET/ROXICET) 5-325 MG tablet Take 1 tablet by mouth every 8 (eight) hours as needed for severe pain. 15 tablet 0  . senna-docusate (SENNA S) 8.6-50 MG tablet 1 to 2 twice daily for constipation 120 tablet 5   No current facility-administered medications for this visit.    SURGICAL HISTORY:  Past Surgical History:  Procedure Laterality Date  . 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 BELPHAROPTOSIS REPAIR Bilateral   . CATARACT EXTRACTION W/ INTRAOCULAR LENS  IMPLANT, BILATERAL    . COLONOSCOPY N/A 11/03/2014   Procedure: COLONOSCOPY;  Surgeon: Rogene Houston, MD;  [facility];  Service: Endoscopy;  Laterality: N/A;  1225  . IR CV LINE INJECTION  08/24/2020  . IR IMAGING GUIDED PORT INSERTION  07/11/2018  . KNEE CARTILAGE SURGERY Left    Left knee  . SKIN CANCER EXCISION  12/2014, 04/25/15  . VIDEO BRONCHOSCOPY Bilateral 05/09/2018   Procedure: VIDEO BRONCHOSCOPY WITH FLUORO;  Surgeon: Juanito Doom, MD;  Location: [facility];  Service: Cardiopulmonary;  Laterality: Bilateral;    REVIEW OF SYSTEMS:  A comprehensive  review of systems was negative except for: Ears, nose, mouth, throat, and face: positive for nasal congestion Respiratory: positive for cough and dyspnea on exertion   PHYSICAL EXAMINATION: General appearance: alert, cooperative, fatigued and no distress Head: Normocephalic, without obvious abnormality, atraumatic Neck: no adenopathy, no JVD, supple, symmetrical, trachea midline and thyroid not enlarged, symmetric, no tenderness/mass/nodules Lymph nodes: Cervical, supraclavicular, and axillary nodes normal. Resp: wheezes bilaterally Back: symmetric, no curvature. ROM normal. No CVA tenderness. Cardio: regular rate and rhythm, S1, S2 normal, no murmur, click, rub or gallop GI: soft, non-tender; bowel sounds normal; no masses,  no organomegaly Extremities: extremities normal, atraumatic, no cyanosis or edema  ECOG PERFORMANCE STATUS: 1 - Symptomatic but completely ambulatory  Blood pressure (!) 154/92, pulse 73, temperature (!) 97.5 F (36.4 C), temperature source Tympanic, resp. rate 17, height 6\' 2"  (  1.88 m), weight 257 lb 11.2 oz (116.9 kg), SpO2 97 %.  LABORATORY DATA: Lab Results  Component Value Date   WBC 5.7 09/28/2020   HGB 13.3 09/28/2020   HCT 37.3 (L) 09/28/2020   MCV 91.2 09/28/2020   PLT 153 09/28/2020      Chemistry      Component Value Date/Time   NA 141 09/07/2020 0945   NA 144 04/09/2018 1507   K 4.2 09/07/2020 0945   CL 111 09/07/2020 0945   CO2 26 09/07/2020 0945   BUN 14 09/07/2020 0945   BUN 15 04/09/2018 1507   CREATININE 0.96 09/07/2020 0945      Component Value Date/Time   CALCIUM 8.8 (L) 09/07/2020 0945   ALKPHOS 60 09/07/2020 0945   AST 12 (L) 09/07/2020 0945   ALT 13 09/07/2020 0945   BILITOT 0.7 09/07/2020 0945       RADIOGRAPHIC STUDIES: No results found.  ASSESSMENT AND PLAN: This is a very pleasant 75 years old white male with very light most smoking history recently diagnosed with stage IV (T3, N0, M1c) non-small cell lung cancer,  squamous cell carcinoma based on the biopsy from the left axillary mass. He status post 4 cycles of induction systemic chemotherapy with carboplatin, paclitaxel and Keytruda with partial response.  The patient is currently on maintenance treatment with single agent Keytruda status post 38 cycles. He continues to tolerate his treatment well with no concerning adverse effects. I recommended for him to proceed with cycle #39 of his maintenance treatment today. The patient will come back for follow-up visit in 3 weeks for evaluation before the next cycle of his treatment. He will have repeat imaging studies after the next cycle of his treatment. For the hypothyroidism, I will increase his dose of levothyroxine to 175 mcg p.o. daily because of the elevated TSH. For the nasal congestion and wheezing, he is followed by Dr. Valeta Harms from pulmonary medicine and he will continue his current treatment with Trelegy and albuterol. The patient was advised to call immediately if he has any concerning symptoms in the interval. The patient voices understanding of current disease status and treatment options and is in agreement with the current care plan. All questions were answered. The patient knows to call the clinic with any problems, questions or concerns. We can certainly see the patient much sooner if necessary.  Disclaimer: This note was dictated with voice recognition software. Similar sounding words can inadvertently be transcribed and may not be corrected upon review.

## 2020-09-28 NOTE — Patient Instructions (Signed)
Cancer Center Discharge Instructions for Patients Receiving Chemotherapy  Today you received the following chemotherapy agents: pembrolizumab.  To help prevent nausea and vomiting after your treatment, we encourage you to take your nausea medication as directed.   If you develop nausea and vomiting that is not controlled by your nausea medication, call the clinic.   BELOW ARE SYMPTOMS THAT SHOULD BE REPORTED IMMEDIATELY:  *FEVER GREATER THAN 100.5 F  *CHILLS WITH OR WITHOUT FEVER  NAUSEA AND VOMITING THAT IS NOT CONTROLLED WITH YOUR NAUSEA MEDICATION  *UNUSUAL SHORTNESS OF BREATH  *UNUSUAL BRUISING OR BLEEDING  TENDERNESS IN MOUTH AND THROAT WITH OR WITHOUT PRESENCE OF ULCERS  *URINARY PROBLEMS  *BOWEL PROBLEMS  UNUSUAL RASH Items with * indicate a potential emergency and should be followed up as soon as possible.  Feel free to call the clinic should you have any questions or concerns. The clinic phone number is [phone].  Please show the CHEMO ALERT CARD at check-in to the Emergency Department and triage nurse.   

## 2020-09-29 DIAGNOSIS — R3912 Poor urinary stream: Secondary | ICD-10-CM | POA: Diagnosis not present

## 2020-09-29 DIAGNOSIS — N50811 Right testicular pain: Secondary | ICD-10-CM | POA: Diagnosis not present

## 2020-09-29 DIAGNOSIS — N401 Enlarged prostate with lower urinary tract symptoms: Secondary | ICD-10-CM | POA: Diagnosis not present

## 2020-09-29 DIAGNOSIS — R972 Elevated prostate specific antigen [PSA]: Secondary | ICD-10-CM | POA: Diagnosis not present

## 2020-09-29 DIAGNOSIS — N202 Calculus of kidney with calculus of ureter: Secondary | ICD-10-CM | POA: Diagnosis not present

## 2020-10-03 ENCOUNTER — Telehealth: Payer: Self-pay | Admitting: Internal Medicine

## 2020-10-03 NOTE — Telephone Encounter (Signed)
Per 1/19 los, pt will receive an updated appt calendar per next visit appt notes

## 2020-10-05 ENCOUNTER — Other Ambulatory Visit: Payer: Self-pay | Admitting: Internal Medicine

## 2020-10-05 DIAGNOSIS — C3491 Malignant neoplasm of unspecified part of right bronchus or lung: Secondary | ICD-10-CM

## 2020-10-13 DIAGNOSIS — H43812 Vitreous degeneration, left eye: Secondary | ICD-10-CM | POA: Diagnosis not present

## 2020-10-13 DIAGNOSIS — H5201 Hypermetropia, right eye: Secondary | ICD-10-CM | POA: Diagnosis not present

## 2020-10-19 ENCOUNTER — Encounter: Payer: Self-pay | Admitting: *Deleted

## 2020-10-19 ENCOUNTER — Inpatient Hospital Stay: Payer: Medicare Other | Attending: Oncology | Admitting: Internal Medicine

## 2020-10-19 ENCOUNTER — Encounter: Payer: Self-pay | Admitting: Internal Medicine

## 2020-10-19 ENCOUNTER — Inpatient Hospital Stay: Payer: Medicare Other

## 2020-10-19 ENCOUNTER — Other Ambulatory Visit: Payer: Self-pay

## 2020-10-19 VITALS — BP 146/92 | HR 77 | Temp 98.2°F | Resp 18 | Ht 74.0 in | Wt 251.7 lb

## 2020-10-19 DIAGNOSIS — Z79899 Other long term (current) drug therapy: Secondary | ICD-10-CM | POA: Insufficient documentation

## 2020-10-19 DIAGNOSIS — C3491 Malignant neoplasm of unspecified part of right bronchus or lung: Secondary | ICD-10-CM

## 2020-10-19 DIAGNOSIS — R062 Wheezing: Secondary | ICD-10-CM | POA: Diagnosis not present

## 2020-10-19 DIAGNOSIS — C773 Secondary and unspecified malignant neoplasm of axilla and upper limb lymph nodes: Secondary | ICD-10-CM | POA: Insufficient documentation

## 2020-10-19 DIAGNOSIS — Z9221 Personal history of antineoplastic chemotherapy: Secondary | ICD-10-CM | POA: Insufficient documentation

## 2020-10-19 DIAGNOSIS — R0981 Nasal congestion: Secondary | ICD-10-CM | POA: Insufficient documentation

## 2020-10-19 DIAGNOSIS — C3490 Malignant neoplasm of unspecified part of unspecified bronchus or lung: Secondary | ICD-10-CM | POA: Diagnosis not present

## 2020-10-19 DIAGNOSIS — Z95828 Presence of other vascular implants and grafts: Secondary | ICD-10-CM

## 2020-10-19 DIAGNOSIS — Z5112 Encounter for antineoplastic immunotherapy: Secondary | ICD-10-CM | POA: Diagnosis not present

## 2020-10-19 DIAGNOSIS — I10 Essential (primary) hypertension: Secondary | ICD-10-CM | POA: Insufficient documentation

## 2020-10-19 DIAGNOSIS — Z923 Personal history of irradiation: Secondary | ICD-10-CM | POA: Diagnosis not present

## 2020-10-19 DIAGNOSIS — C3402 Malignant neoplasm of left main bronchus: Secondary | ICD-10-CM | POA: Diagnosis not present

## 2020-10-19 DIAGNOSIS — E039 Hypothyroidism, unspecified: Secondary | ICD-10-CM | POA: Insufficient documentation

## 2020-10-19 DIAGNOSIS — C7801 Secondary malignant neoplasm of right lung: Secondary | ICD-10-CM

## 2020-10-19 DIAGNOSIS — R5383 Other fatigue: Secondary | ICD-10-CM

## 2020-10-19 LAB — CMP (CANCER CENTER ONLY)
ALT: 16 U/L (ref 0–44)
AST: 16 U/L (ref 15–41)
Albumin: 3.9 g/dL (ref 3.5–5.0)
Alkaline Phosphatase: 64 U/L (ref 38–126)
Anion gap: 5 (ref 5–15)
BUN: 18 mg/dL (ref 8–23)
CO2: 23 mmol/L (ref 22–32)
Calcium: 9 mg/dL (ref 8.9–10.3)
Chloride: 110 mmol/L (ref 98–111)
Creatinine: 1.05 mg/dL (ref 0.61–1.24)
GFR, Estimated: >60 mL/min (ref >60)
Glucose, Bld: 99 mg/dL (ref 70–99)
Potassium: 4.4 mmol/L (ref 3.5–5.1)
Sodium: 138 mmol/L (ref 135–145)
Total Bilirubin: 0.7 mg/dL (ref 0.3–1.2)
Total Protein: 7.3 g/dL (ref 6.5–8.1)

## 2020-10-19 LAB — CBC WITH DIFFERENTIAL (CANCER CENTER ONLY)
Abs Immature Granulocytes: 0.02 10*3/uL (ref 0.00–0.07)
Basophils Absolute: 0.1 10*3/uL (ref 0.0–0.1)
Basophils Relative: 1 %
Eosinophils Absolute: 0.3 10*3/uL (ref 0.0–0.5)
Eosinophils Relative: 6 %
HCT: 37.7 % — ABNORMAL LOW (ref 39.0–52.0)
Hemoglobin: 13.1 g/dL (ref 13.0–17.0)
Immature Granulocytes: 0 %
Lymphocytes Relative: 15 %
Lymphs Abs: 0.7 10*3/uL (ref 0.7–4.0)
MCH: 32.2 pg (ref 26.0–34.0)
MCHC: 34.7 g/dL (ref 30.0–36.0)
MCV: 92.6 fL (ref 80.0–100.0)
Monocytes Absolute: 0.9 10*3/uL (ref 0.1–1.0)
Monocytes Relative: 19 %
Neutro Abs: 2.8 10*3/uL (ref 1.7–7.7)
Neutrophils Relative %: 59 %
Platelet Count: 139 10*3/uL — ABNORMAL LOW (ref 150–400)
RBC: 4.07 MIL/uL — ABNORMAL LOW (ref 4.22–5.81)
RDW: 11.9 % (ref 11.5–15.5)
WBC Count: 4.8 10*3/uL (ref 4.0–10.5)
nRBC: 0 % (ref 0.0–0.2)

## 2020-10-19 LAB — TSH: TSH: 0.363 u[IU]/mL (ref 0.320–4.118)

## 2020-10-19 MED ORDER — SODIUM CHLORIDE 0.9 % IV SOLN
Freq: Once | INTRAVENOUS | Status: AC
  Filled 2020-10-19: qty 250

## 2020-10-19 MED ORDER — SODIUM CHLORIDE 0.9 % IV SOLN
200.0000 mg | Freq: Once | INTRAVENOUS | Status: AC
  Administered 2020-10-19: 200 mg via INTRAVENOUS
  Filled 2020-10-19: qty 8

## 2020-10-19 MED ORDER — HEPARIN SOD (PORK) LOCK FLUSH 100 UNIT/ML IV SOLN
500.0000 [IU] | Freq: Once | INTRAVENOUS | Status: AC | PRN
  Administered 2020-10-19: 500 [IU]
  Filled 2020-10-19: qty 5

## 2020-10-19 MED ORDER — SODIUM CHLORIDE 0.9% FLUSH
10.0000 mL | Freq: Once | INTRAVENOUS | Status: AC
  Administered 2020-10-19: 10 mL
  Filled 2020-10-19: qty 10

## 2020-10-19 MED ORDER — SODIUM CHLORIDE 0.9% FLUSH
10.0000 mL | INTRAVENOUS | Status: DC | PRN
  Administered 2020-10-19: 10 mL
  Filled 2020-10-19: qty 10

## 2020-10-19 MED ORDER — ALPRAZOLAM 0.25 MG PO TABS: 0.2500 mg | ORAL_TABLET | Freq: Every evening | ORAL | 0 refills | Status: DC | PRN

## 2020-10-19 NOTE — Patient Instructions (Signed)
[facility] Discharge Instructions for Patients Receiving Chemotherapy  Today you received the following chemotherapy agents: pembrolizumab.  To help prevent nausea and vomiting after your treatment, we encourage you to take your nausea medication as directed.   If you develop nausea and vomiting that is not controlled by your nausea medication, call the clinic.   BELOW ARE SYMPTOMS THAT SHOULD BE REPORTED IMMEDIATELY:  *FEVER GREATER THAN 100.5 F  *CHILLS WITH OR WITHOUT FEVER  NAUSEA AND VOMITING THAT IS NOT CONTROLLED WITH YOUR NAUSEA MEDICATION  *UNUSUAL SHORTNESS OF BREATH  *UNUSUAL BRUISING OR BLEEDING  TENDERNESS IN MOUTH AND THROAT WITH OR WITHOUT PRESENCE OF ULCERS  *URINARY PROBLEMS  *BOWEL PROBLEMS  UNUSUAL RASH Items with * indicate a potential emergency and should be followed up as soon as possible.  Feel free to call the clinic should you have any questions or concerns. The clinic phone number is [phone].  Please show the CHEMO ALERT CARD at check-in to the Emergency Department and triage nurse.   

## 2020-10-19 NOTE — Progress Notes (Signed)
Referred patient to social workers for [employer] assistance with fuel. Provided patient there contact name and numbers as well.  He has my card for any additional financial questions or concerns.

## 2020-10-19 NOTE — Patient Instructions (Signed)

## 2020-10-19 NOTE — Progress Notes (Signed)
[facility] Clinical Social Work  Clinical Social Work received referral from financial advocate for gas card assistance.  Patient has already used all of his [employer].  CSW met with patient in the infusion room to explore additional resources.  CSW and patient completed the [employer] gas card application, and submitted. CSW also encouraged patient to connect with the [employer] foundation as they can also offer resources.  CSW encouraged patient to call with additional questions or concerns.     , MSW, LCSW, OSW-C Clinical Social Worker [facility] [phone]       

## 2020-10-19 NOTE — Progress Notes (Signed)
[place] [phone]   [phone]  OFFICE PROGRESS NOTE  [name], DO [place] [address]  DIAGNOSIS: Stage IV (T3, N0, M1c) non-small cell lung cancer, squamous cell carcinoma presented with large right infrahilar mass in addition to left upper lobe lung nodule as well as left axillary mass with left axillary lymph node diagnosed in August 2019.  PRIOR THERAPY:  1) Palliative radiotherapy to the right infrahilar mass as well as the axillary mass under the care of Dr. Lisbeth Renshaw. 2) Systemic chemotherapy with carboplatin for AUC of 5, paclitaxel 175 mg/M2 and Keytruda 200 mg IV every 3 weeks status post 4 cycles.  CURRENT THERAPY: Maintenance immunotherapy with single agent Keytruda 200 mg IV every 3 weeks status post 39 cycles.  INTERVAL HISTORY: Travis Padilla 75 y.o. male returns to the clinic today for follow-up visit.  The patient has a lot of complaints today including worsening shortness of breath recently.  He is followed by Dr. Valeta Harms and currently on treatment with Trelegy as well as albuterol.  He also gained several pounds since his last visit.  His blood pressure is not well controlled and he contacted his cardiologist for adjustment of his medication.  The patient also has increased anxiety recently.  He denied having any chest pain but continues have mild cough with no hemoptysis.  He denied having any fever or chills.  He has no nausea, vomiting, diarrhea or constipation.  He has no headache or visual changes.  He is here today for evaluation before starting cycle #40 of maintenance therapy.    MEDICAL HISTORY: Past Medical History:  Diagnosis Date  . Abnormal nuclear stress test    December, 2013  . Anemia   . Axillary adenopathy 04/24/2018  . CAD (coronary artery disease)    Mild nonobstructive plaque in cath 2013  . Chest pain    December, 2013  . Cough with hemoptysis 04/22/2018  . Dizziness   .  Dyslipidemia 07/14/2019  . Encounter for antineoplastic immunotherapy 05/13/2018  . Gout   . Hemorrhoid   . Hyperlipidemia   . Hypertension   . Hypothyroidism (acquired) 08/11/2018  . 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 Obesity, unspecified 03/28/2009   Qualifier: Diagnosis of  By: [name], MD, [name]    . Palpitations 03/28/2009   Qualifier: Diagnosis of  By: [name], MD, [name]    . Right lower lobe lung mass 04/22/2018  . Skin cancer   . SNORING 03/28/2009   Qualifier: Diagnosis of  By: [name], MD, [name]    . Spinal stenosis   . 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[place]) 05/13/2018    ALLERGIES:  is allergic to pravastatin and iodine.  MEDICATIONS:  Current Outpatient Medications  Medication Sig Dispense Refill  . albuterol (PROVENTIL) (2.5 MG/3ML) 0.083% nebulizer solution Inhale 3 mLs (2.5 mg total) into the lungs every 6 (six) hours as needed for wheezing or shortness of breath. 75 mL 11  . Alirocumab (PRALUENT) 150 MG/ML SOAJ Inject 150 mg into the skin every 14 (fourteen) days. 2 pen 11  . Artificial Tear Solution (SOOTHE XP OP) Place 1 drop into both eyes 2 (two) times daily.    . carvedilol (COREG) 12.5 MG tablet Take 1 tablet (12.5 mg total) by  mouth in the morning, at noon, and at bedtime. 270 tablet 3  . Cyanocobalamin (VITAMIN B 12) 250 MCG LOZG Take by mouth.    . diphenhydramine-acetaminophen (TYLENOL PM) 25-500 MG TABS tablet Take 1-2 tablets by mouth at bedtime as needed (pain).    . ferrous sulfate 324 MG TBEC Take 324 mg by mouth daily with breakfast.    . fluticasone (FLONASE) 50 MCG/ACT nasal spray Place 1 spray into both nostrils daily. 16 g 3  . Fluticasone-Umeclidin-Vilant (TRELEGY ELLIPTA) 100-62.5-25 MCG/INH AEPB Inhale 1 puff into the lungs daily. 60 each 6  . guaiFENesin-dextromethorphan (ROBITUSSIN  DM) 100-10 MG/5ML syrup Take 5 mLs by mouth every 4 (four) hours as needed for cough.    . levothyroxine (SYNTHROID) 175 MCG tablet TAKE 1 TABLET BY MOUTH EVERY DAY BEFORE BREAKFAST 30 tablet 1  . methocarbamol (ROBAXIN) 500 MG tablet Take 500 mg by mouth daily.    . Multiple Vitamins-Minerals (PRESERVISION AREDS 2 PO) Take by mouth.    . senna-docusate (SENNA S) 8.6-50 MG tablet 1 to 2 twice daily for constipation 120 tablet 5  . azelastine (ASTELIN) 0.1 % nasal spray Place 1 spray into both nostrils 2 (two) times daily. (Patient not taking: No sig reported) 30 mL 12  . benzonatate (TESSALON) 200 MG capsule Take 1 capsule (200 mg total) by mouth 3 (three) times daily as needed for cough. (Patient not taking: Reported on 10/19/2020) 120 capsule 1  . Fluticasone-Umeclidin-Vilant (TRELEGY ELLIPTA) 100-62.5-25 MCG/INH AEPB Inhale 1 puff into the lungs daily. 2 each 0  . lidocaine-prilocaine (EMLA) cream Apply 1 application topically as needed. (Patient not taking: No sig reported) 30 g 1  . oxyCODONE-acetaminophen (PERCOCET/ROXICET) 5-325 MG tablet Take 1 tablet by mouth every 8 (eight) hours as needed for severe pain. (Patient not taking: Reported on 10/19/2020) 15 tablet 0   No current facility-administered medications for this visit.    SURGICAL HISTORY:  Past Surgical History:  Procedure Laterality Date  . 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 BELPHAROPTOSIS REPAIR Bilateral   . CATARACT EXTRACTION W/ INTRAOCULAR LENS  IMPLANT, BILATERAL    . COLONOSCOPY N/A 11/03/2014   Procedure: COLONOSCOPY;  Surgeon: Rogene Houston, MD;  [facility];  Service: Endoscopy;  Laterality: N/A;  1225  . IR CV LINE INJECTION  08/24/2020  . IR IMAGING GUIDED PORT INSERTION  07/11/2018  . KNEE CARTILAGE SURGERY Left    Left knee  . SKIN CANCER EXCISION  12/2014, 04/25/15  . VIDEO BRONCHOSCOPY Bilateral 05/09/2018   Procedure: VIDEO BRONCHOSCOPY WITH FLUORO;  Surgeon: Juanito Doom, MD;  [facility];  Service: Cardiopulmonary;  Laterality: Bilateral;    REVIEW OF SYSTEMS:  A comprehensive review of systems was negative except for: Constitutional: positive for fatigue Respiratory: positive for cough and dyspnea on exertion   PHYSICAL EXAMINATION: General appearance: alert, cooperative, fatigued and no distress Head: Normocephalic, without obvious abnormality, atraumatic Neck: no adenopathy, no JVD, supple, symmetrical, trachea midline and thyroid not enlarged, symmetric, no tenderness/mass/nodules Lymph nodes: Cervical, supraclavicular, and axillary nodes normal. Resp: wheezes bilaterally Back: symmetric, no curvature. ROM normal. No CVA tenderness. Cardio: regular rate and rhythm, S1, S2 normal, no murmur, click, rub or gallop GI: soft, non-tender; bowel sounds normal; no masses,  no organomegaly Extremities: extremities normal, atraumatic, no cyanosis or edema  ECOG PERFORMANCE STATUS: 1 - Symptomatic but completely ambulatory  Blood pressure (!) 146/92, pulse 77, temperature 98.2 F (36.8 C), temperature source Tympanic,  resp. rate 18, height 6\' 2"  (1.88 m), weight 251 lb 11.2 oz (114.2 kg), SpO2 100 %.  LABORATORY DATA: Lab Results  Component Value Date   WBC 4.8 10/19/2020   HGB 13.1 10/19/2020   HCT 37.7 (L) 10/19/2020   MCV 92.6 10/19/2020   PLT 139 (L) 10/19/2020      Chemistry      Component Value Date/Time   NA 140 09/28/2020 1101   NA 144 04/09/2018 1507   K 4.2 09/28/2020 1101   CL 108 09/28/2020 1101   CO2 25 09/28/2020 1101   BUN 13 09/28/2020 1101   BUN 15 04/09/2018 1507   CREATININE 0.97 09/28/2020 1101      Component Value Date/Time   CALCIUM 8.9 09/28/2020 1101   ALKPHOS 58 09/28/2020 1101   AST 15 09/28/2020 1101   ALT 10 09/28/2020 1101   BILITOT 0.6 09/28/2020 1101       RADIOGRAPHIC STUDIES: No results found.  ASSESSMENT AND PLAN: This is a very pleasant 75 years old white male with very light most smoking history recently  diagnosed with stage IV (T3, N0, M1c) non-small cell lung cancer, squamous cell carcinoma based on the biopsy from the left axillary mass. He status post 4 cycles of induction systemic chemotherapy with carboplatin, paclitaxel and Keytruda with partial response.  The patient is currently on maintenance treatment with single agent Keytruda status post 39 cycles. He continues to tolerate his treatment well with no concerning adverse effects. I recommended for the patient to proceed with cycle #40 of his maintenance therapy today as planned. The patient will come back for follow-up visit in 3 weeks for evaluation with repeat CT scan of the chest, abdomen pelvis for restaging of his disease. For the hypothyroidism, we will continue to monitor his TSH closely and adjust his dose accordingly. For the hypertension he will reach out to his cardiologist for adjustment of his medication. For the nasal congestion and wheezing, he is followed by Dr. Valeta Harms from pulmonary medicine and he will continue his current treatment with Trelegy and albuterol. The patient was advised to call immediately if he has any concerning symptoms in the interval. The patient voices understanding of current disease status and treatment options and is in agreement with the current care plan. All questions were answered. The patient knows to call the clinic with any problems, questions or concerns. We can certainly see the patient much sooner if necessary.  Disclaimer: This note was dictated with voice recognition software. Similar sounding words can inadvertently be transcribed and may not be corrected upon review.

## 2020-10-21 ENCOUNTER — Telehealth: Payer: Self-pay | Admitting: Internal Medicine

## 2020-10-21 NOTE — Telephone Encounter (Signed)
Per 2/9 los, next appts already scheduled

## 2020-10-31 ENCOUNTER — Other Ambulatory Visit: Payer: Self-pay | Admitting: Internal Medicine

## 2020-10-31 DIAGNOSIS — C3491 Malignant neoplasm of unspecified part of right bronchus or lung: Secondary | ICD-10-CM

## 2020-10-31 NOTE — Progress Notes (Signed)
[place] OFFICE PROGRESS NOTE  [name], DO [place] [address]  DIAGNOSIS: Stage IV (T3, N0, M1c) non-small cell lung cancer, squamous cell carcinoma presented with large right infrahilar mass in addition to left upper lobe lung nodule as well as left axillary mass with left axillary lymph node diagnosed in August 2019  PRIOR THERAPY: Palliative radiotherapy to the right infrahilar mass as well as the axillary mass under the care of Dr. Lisbeth Renshaw.  CURRENT THERAPY: Systemic chemotherapy with carboplatin for AUC of 5, paclitaxel 175 mg/M2 and Keytruda 200 mg IV every 3 weeks. Status post 44 cycles. Starting from cycle #5, the patient has been on maintenance immunotherapy with single agent Keytruda 200 mg IV every 3 weeks.  INTERVAL HISTORY: Travis Padilla 75 y.o. male returns to the clinic today for a follow up visit. The patient is feeling better today. Last week, he had a sinus infection. He has had trouble on an off with sinuses for the last 5 months or so and has been seen by ENT and his PCP. He states he had sinus congestion/pressure and chills. He was given a prednisone taper. He took 20 mg today and is supposed to take 10 mg tomorrow before competing his taper. He said he feels like night and day. He did have some flair up of some sores on his lip. His shortness of breath is greatly improved with his inhaler and Flonase.    The patient continues to tolerate treatment with immunotherapy with Keytruda well without any adverse effects. Denies any fever, night sweats, or weight loss. Denies any chest pain or hemoptysis.  The patient has a cough which he attributes to postnasal drainage.  Denies any nausea orvomiting.He denies headaches. He alternates between having constipation and diarrhea occasionally. He has baseline visual issues for which he has implants. The patient recently had a restaging CT scan performed. The patient is here today for evaluation  prior to starting cycle #45 of Keytruda   MEDICAL HISTORY: Past Medical History:  Diagnosis Date  . Abnormal nuclear stress test    December, 2013  . Anemia   . Axillary adenopathy 04/24/2018  . CAD (coronary artery disease)    Mild nonobstructive plaque in cath 2013  . Chest pain    December, 2013  . Cough with hemoptysis 04/22/2018  . Dizziness   . Dyslipidemia 07/14/2019  . Encounter for antineoplastic immunotherapy 05/13/2018  . Gout   . Hemorrhoid   . Hyperlipidemia   . Hypertension   . Hypothyroidism (acquired) 08/11/2018  . 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) ([facility]) dx'd 04/17/18   to LN, infrahilar mass, lung nodule and lt axilla  . Obesity, unspecified 03/28/2009   Qualifier: Diagnosis of  By: [name], MD, [name]    . Palpitations 03/28/2009   Qualifier: Diagnosis of  By: [name], MD, [name]    . Right lower lobe lung mass 04/22/2018  . Skin cancer   . SNORING 03/28/2009   Qualifier: Diagnosis of  By: [name], MD, [name]    . Spinal stenosis   . 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[facility]) 05/13/2018    ALLERGIES:  is allergic to pravastatin and iodine.  MEDICATIONS:  Current Outpatient Medications  Medication Sig Dispense Refill  . albuterol (PROVENTIL) (2.5 MG/3ML) 0.083% nebulizer solution Inhale 3 mLs (2.5 mg total) into the lungs every 6 (six) hours as needed  for wheezing or shortness of breath. 75 mL 11  . Alirocumab (PRALUENT) 150 MG/ML SOAJ Inject 150 mg into the skin every 14 (fourteen) days. 2 pen 11  . ALPRAZolam (XANAX) 0.25 MG tablet Take 1 tablet (0.25 mg total) by mouth at bedtime as needed for anxiety. 30 tablet 0  . Artificial Tear Solution (SOOTHE XP OP) Place 1 drop into both eyes 2 (two) times daily.    Marland Kitchen azelastine (ASTELIN) 0.1 % nasal spray Place 1 spray into both nostrils 2 (two) times daily. (Patient not taking: No sig reported) 30 mL 12  . benzonatate (TESSALON)  200 MG capsule Take 1 capsule (200 mg total) by mouth 3 (three) times daily as needed for cough. (Patient not taking: Reported on 10/19/2020) 120 capsule 1  . carvedilol (COREG) 12.5 MG tablet Take 1 tablet (12.5 mg total) by mouth in the morning, at noon, and at bedtime. 270 tablet 3  . Cyanocobalamin (VITAMIN B 12) 250 MCG LOZG Take by mouth.    . diphenhydramine-acetaminophen (TYLENOL PM) 25-500 MG TABS tablet Take 1-2 tablets by mouth at bedtime as needed (pain).    . ferrous sulfate 324 MG TBEC Take 324 mg by mouth daily with breakfast.    . fluticasone (FLONASE) 50 MCG/ACT nasal spray Place 1 spray into both nostrils daily. 16 g 3  . Fluticasone-Umeclidin-Vilant (TRELEGY ELLIPTA) 100-62.5-25 MCG/INH AEPB Inhale 1 puff into the lungs daily. 60 each 6  . Fluticasone-Umeclidin-Vilant (TRELEGY ELLIPTA) 100-62.5-25 MCG/INH AEPB Inhale 1 puff into the lungs daily. 2 each 0  . guaiFENesin-dextromethorphan (ROBITUSSIN DM) 100-10 MG/5ML syrup Take 5 mLs by mouth every 4 (four) hours as needed for cough.    . levothyroxine (SYNTHROID) 175 MCG tablet TAKE 1 TABLET BY MOUTH EVERY DAY BEFORE BREAKFAST 30 tablet 1  . lidocaine-prilocaine (EMLA) cream Apply 1 application topically as needed. (Patient not taking: No sig reported) 30 g 1  . methocarbamol (ROBAXIN) 500 MG tablet Take 500 mg by mouth daily.    . Multiple Vitamins-Minerals (PRESERVISION AREDS 2 PO) Take by mouth.    . oxyCODONE-acetaminophen (PERCOCET/ROXICET) 5-325 MG tablet Take 1 tablet by mouth every 8 (eight) hours as needed for severe pain. (Patient not taking: Reported on 10/19/2020) 15 tablet 0  . senna-docusate (SENNA S) 8.6-50 MG tablet 1 to 2 twice daily for constipation 120 tablet 5   No current facility-administered medications for this visit.    SURGICAL HISTORY:  Past Surgical History:  Procedure Laterality Date  . 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 BELPHAROPTOSIS REPAIR Bilateral   . CATARACT EXTRACTION W/ INTRAOCULAR LENS   IMPLANT, BILATERAL    . COLONOSCOPY N/A 11/03/2014   Procedure: COLONOSCOPY;  Surgeon: Rogene Houston, MD;  Location: [facility];  Service: Endoscopy;  Laterality: N/A;  1225  . IR CV LINE INJECTION  08/24/2020  . IR IMAGING GUIDED PORT INSERTION  07/11/2018  . KNEE CARTILAGE SURGERY Left    Left knee  . SKIN CANCER EXCISION  12/2014, 04/25/15  . VIDEO BRONCHOSCOPY Bilateral 05/09/2018   Procedure: VIDEO BRONCHOSCOPY WITH FLUORO;  Surgeon: Juanito Doom, MD;  Location: [facility];  Service: Cardiopulmonary;  Laterality: Bilateral;    REVIEW OF SYSTEMS:   Review of Systems  Constitutional: Positive for fatigue. Negative for appetite change, chills (resovled), fever and unexpected weight change.  HENT: Positive for sore lips. Negative for mouth sores, nosebleeds, sore throat and trouble swallowing.   Eyes: Negative for eye problems and icterus.  Respiratory: Negative for cough, hemoptysis, shortness of breath (improved) and wheezing.   Cardiovascular: Negative for chest pain and leg swelling.  Gastrointestinal: Positive for occasional diarrhea or constipation. Negative for abdominal pain, nausea and vomiting.  Genitourinary: Negative for bladder incontinence, difficulty urinating, dysuria, frequency and hematuria.   Musculoskeletal: Positive for chronic back pain secondary to hx of herniated disk and new low back pain which radiates across his low back. Negative for gait problem, neck pain and neck stiffness.  Skin: Negative for itching and rash.  Neurological: Negative for dizziness, extremity weakness, gait problem, headaches, light-headedness and seizures.  Hematological: Negative for adenopathy. Does not bruise/bleed easily.  Psychiatric/Behavioral: Negative for confusion, depression and sleep disturbance. The patient is not nervous/anxious.     PHYSICAL EXAMINATION:  There were no vitals taken for this visit.  ECOG PERFORMANCE STATUS: 1 - Symptomatic but completely  ambulatory  Physical Exam  Constitutional: Oriented to person, place, and time and well-developed, well-nourished, and in no distress. No distress.  HENT:  Head: Normocephalic and atraumatic.  Mouth/Throat: Oropharynx is clear and moist. No oropharyngeal exudate.  Eyes: Conjunctivae are normal. Right eye exhibits no discharge. Left eye exhibits no discharge. No scleral icterus.  Neck: Normal range of motion. Neck supple.  Cardiovascular: Normal rate, regular rhythm, normal heart sounds and intact distal pulses.   Pulmonary/Chest: Effort normal and breath sounds normal. No respiratory distress. No wheezes. No rales.  Abdominal: Soft. Bowel sounds are normal. Exhibits no distension and no mass. There is no tenderness.  Musculoskeletal: Normal range of motion. Exhibits no edema.  Lymphadenopathy:    No cervical adenopathy.  Neurological: Alert and oriented to person, place, and time. Exhibits normal muscle tone. Gait normal. Coordination normal.  Skin: Skin is warm and dry. No rash noted. Not diaphoretic. No erythema. No pallor.  Psychiatric: Mood, memory and judgment normal.  Vitals reviewed.  LABORATORY DATA: Lab Results  Component Value Date   WBC 4.8 10/19/2020   HGB 13.1 10/19/2020   HCT 37.7 (L) 10/19/2020   MCV 92.6 10/19/2020   PLT 139 (L) 10/19/2020      Chemistry      Component Value Date/Time   NA 138 10/19/2020 0940   NA 144 04/09/2018 1507   K 4.4 10/19/2020 0940   CL 110 10/19/2020 0940   CO2 23 10/19/2020 0940   BUN 18 10/19/2020 0940   BUN 15 04/09/2018 1507   CREATININE 1.05 10/19/2020 0940      Component Value Date/Time   CALCIUM 9.0 10/19/2020 0940   ALKPHOS 64 10/19/2020 0940   AST 16 10/19/2020 0940   ALT 16 10/19/2020 0940   BILITOT 0.7 10/19/2020 0940       RADIOGRAPHIC STUDIES:  No results found.   ASSESSMENT/PLAN:  This is a very pleasant 75 year old Caucasian male with a very light smoking history. He was diagnosed with stage IV (T3,  N0, M1C) non-small cell lung cancer, squamous cell carcinoma based on the biopsy of the left axillary mass. He was diagnosed in August 2019.   He is status post 4 cycles of induction systemic chemotherapy with carboplatin, paclitaxel, and Keytruda with a partial response. Status post 44 cycles. Starting from cycle #5, he is currently undergoing maintenance immunotherapy with Keytruda 200 mg IV every 3 weeks. He developed a skin rash after cycle #7 which resolved with a Medrol Dosepak and Benadryl.Otherwise, he is tolerating treatment well without any adverse effects.  The patient recently had a restaging CT scan performed. Dr. Julien Nordmann personally  and independently reviewed the scan and discussed the results with the patient. The scan showed no evidence for disease progression.   Dr. Julien Nordmann gave the patient the option of continuing on the current treatment vs continuing on observation with a restaging CT scan in 3 months. The patient will proceed with treatment today as scheduled. He will talk to his wife and let us know at his next visit if he wishes to continue or proceed on observation.   We will see him back for a follow up visit in 3 weeks for evaluation before starting cycle #46.    We will continue to monitor his TSH closely. His TSH is still pending from today.   The patient was advised to call immediately if she has any concerning symptoms in the interval. The patient voices understanding of current disease status and treatment options and is in agreement with the current care plan. All questions were answered. The patient knows to call the clinic with any problems, questions or concerns. We can certainly see the patient much sooner if necessary     No orders of the defined types were placed in this encounter.     Amyrie Illingworth L Mellony Danziger, PA-C 10/31/20  ADDENDUM: Hematology/Oncology Attending: I had a face-to-face encounter with the patient today.  I recommended his care  plan.  This is a very pleasant 75 years old white male with metastatic non-small cell lung cancer, squamous cell carcinoma status post induction treatment with carboplatin, paclitaxel and Keytruda for 4 cycles followed by maintenance treatment with [facility] and he is currently status post 44 cycles.  He has been tolerating his treatment well except for the last few months he has been complaining of recurrent sinusitis and treated by allergy as well as ENT and pulmonologist.  He has some mild improvement of his symptoms after treatment with a tapered dose of prednisone. The patient had repeat CT scan of the chest, abdomen pelvis performed recently.  I personally and independently reviewed the scan and discussed the results with the patient today. His scan showed no concerning findings for disease progression. I had a lengthy discussion with the patient about his treatment options including continuation of his treatment versus taking a break of the immunotherapy to see if it would help with his recurrent sinus infection.  The patient would like to proceed with the cycle today as planned. He will discuss with his wife whether to take a break off treatment or not and will let us know before the next cycle of his treatment.  If he decided to take a treatment break, we will see him back for follow-up visit in 3 months for evaluation with repeat imaging studies. The patient was advised to call immediately if he has any other concerning symptoms in the interval.  Disclaimer: This note was dictated with voice recognition software. Similar sounding words can inadvertently be transcribed and may be missed upon review. Eilleen Kempf, MD 11/08/20

## 2020-11-01 ENCOUNTER — Encounter: Payer: Self-pay | Admitting: Internal Medicine

## 2020-11-01 NOTE — Telephone Encounter (Signed)
Pt also called right after sending this message and reiterated his sx as detailed in his message. Pt was advised he should be tested for COVID. He states he has a home kit. Pt was also encouraged to have the PCR test done as well. I offered to help the pt get scheduled. He declined and advised he knows were to go and will get tested.

## 2020-11-03 ENCOUNTER — Encounter: Payer: Self-pay | Admitting: Family Medicine

## 2020-11-03 ENCOUNTER — Telehealth: Payer: Self-pay | Admitting: Pulmonary Disease

## 2020-11-03 ENCOUNTER — Other Ambulatory Visit: Payer: Self-pay

## 2020-11-03 ENCOUNTER — Ambulatory Visit (INDEPENDENT_AMBULATORY_CARE_PROVIDER_SITE_OTHER): Payer: Medicare Other | Admitting: Family Medicine

## 2020-11-03 ENCOUNTER — Ambulatory Visit (HOSPITAL_COMMUNITY)
Admission: RE | Admit: 2020-11-03 | Discharge: 2020-11-03 | Disposition: A | Discharge status: Home / Self Care | Payer: Medicare Other | Source: Ambulatory Visit | Attending: Internal Medicine | Admitting: Internal Medicine

## 2020-11-03 VITALS — BP 115/67 | HR 107 | Temp 96.8°F

## 2020-11-03 DIAGNOSIS — J069 Acute upper respiratory infection, unspecified: Secondary | ICD-10-CM

## 2020-11-03 DIAGNOSIS — K5750 Diverticulosis of both small and large intestine without perforation or abscess without bleeding: Secondary | ICD-10-CM | POA: Diagnosis not present

## 2020-11-03 DIAGNOSIS — J8410 Pulmonary fibrosis, unspecified: Secondary | ICD-10-CM | POA: Diagnosis not present

## 2020-11-03 DIAGNOSIS — N200 Calculus of kidney: Secondary | ICD-10-CM | POA: Diagnosis not present

## 2020-11-03 DIAGNOSIS — C3490 Malignant neoplasm of unspecified part of unspecified bronchus or lung: Secondary | ICD-10-CM | POA: Diagnosis not present

## 2020-11-03 DIAGNOSIS — I2510 Atherosclerotic heart disease of native coronary artery without angina pectoris: Secondary | ICD-10-CM | POA: Diagnosis not present

## 2020-11-03 DIAGNOSIS — Z1152 Encounter for screening for COVID-19: Secondary | ICD-10-CM

## 2020-11-03 DIAGNOSIS — J984 Other disorders of lung: Secondary | ICD-10-CM | POA: Diagnosis not present

## 2020-11-03 DIAGNOSIS — N201 Calculus of ureter: Secondary | ICD-10-CM | POA: Diagnosis not present

## 2020-11-03 DIAGNOSIS — I700 Atherosclerosis of aorta: Secondary | ICD-10-CM | POA: Diagnosis not present

## 2020-11-03 MED ORDER — ALBUTEROL SULFATE (2.5 MG/3ML) 0.083% IN NEBU: 2.5000 mg | INHALATION_SOLUTION | Freq: Four times a day (QID) | RESPIRATORY_TRACT | 11 refills | Status: DC | PRN

## 2020-11-03 MED ORDER — PREDNISONE 10 MG (21) PO TBPK: ORAL_TABLET | ORAL | 0 refills | Status: DC

## 2020-11-03 MED ORDER — IOHEXOL 300 MG/ML  SOLN
100.0000 mL | Freq: Once | INTRAMUSCULAR | Status: AC | PRN
  Administered 2020-11-03: 100 mL via INTRAVENOUS

## 2020-11-03 NOTE — Progress Notes (Signed)
Assessment & Plan:  1. Viral URI Discussed symptom management and reasons that should prompt him to go to the ER.  - predniSONE (STERAPRED UNI-PAK 21 TAB) 10 MG (21) TBPK tablet; As directed x 6 days  Dispense: 21 tablet; Refill: 0  2. Encounter for screening for COVID-19 - Novel Coronavirus, NAA ([facility])   Follow up plan: Return if symptoms worsen or fail to improve.  Hendricks Limes, MSN, APRN, FNP-C [facility]  Subjective:   Patient ID: Travis Padilla, male    DOB: 1945-10-17, 75 y.o.   MRN: [id number]  HPI: Travis Padilla is a 75 y.o. male presenting on 11/03/2020 for Headache, Chills, and Generalized Body Aches (Patient states this started Monday and he tested Neg for COVID on Tuesday.)  Patient complains of cough, headache, fever, diarrhea, body aches, chills, feeling off balance, decreased appetite, and pressure in his head. Onset of symptoms was 3 days ago, unchanged since that time. He is drinking plenty of fluids. Evaluation to date: rapid COVID-19 test negative on Tuesday. Treatment to date: antihistamines, cough suppressants, nasal steroids and Tylenol. He has a history of lung cancer that he is currently being treated for. He does not smoke.    ROS: Negative unless specifically indicated above in HPI.   Relevant past medical history reviewed and updated as indicated.   Allergies and medications reviewed and updated.   Current Outpatient Medications:  .  albuterol (PROVENTIL) (2.5 MG/3ML) 0.083% nebulizer solution, Inhale 3 mLs (2.5 mg total) into the lungs every 6 (six) hours as needed for wheezing or shortness of breath., Disp: 120 mL, Rfl: 11 .  Alirocumab (PRALUENT) 150 MG/ML SOAJ, Inject 150 mg into the skin every 14 (fourteen) days., Disp: 2 pen, Rfl: 11 .  ALPRAZolam (XANAX) 0.25 MG tablet, Take 1 tablet (0.25 mg total) by mouth at bedtime as needed for anxiety., Disp: 30 tablet, Rfl: 0 .  Artificial Tear Solution (SOOTHE XP OP), Place 1  drop into both eyes 2 (two) times daily., Disp: , Rfl:  .  azelastine (ASTELIN) 0.1 % nasal spray, Place 1 spray into both nostrils 2 (two) times daily., Disp: 30 mL, Rfl: 12 .  carvedilol (COREG) 12.5 MG tablet, Take 1 tablet (12.5 mg total) by mouth in the morning, at noon, and at bedtime., Disp: 270 tablet, Rfl: 3 .  Cyanocobalamin (VITAMIN B 12) 250 MCG LOZG, Take by mouth., Disp: , Rfl:  .  diphenhydramine-acetaminophen (TYLENOL PM) 25-500 MG TABS tablet, Take 1-2 tablets by mouth at bedtime as needed (pain)., Disp: , Rfl:  .  ferrous sulfate 324 MG TBEC, Take 324 mg by mouth daily with breakfast., Disp: , Rfl:  .  fluticasone (FLONASE) 50 MCG/ACT nasal spray, Place 1 spray into both nostrils daily., Disp: 16 g, Rfl: 3 .  Fluticasone-Umeclidin-Vilant (TRELEGY ELLIPTA) 100-62.5-25 MCG/INH AEPB, Inhale 1 puff into the lungs daily., Disp: 60 each, Rfl: 6 .  Fluticasone-Umeclidin-Vilant (TRELEGY ELLIPTA) 100-62.5-25 MCG/INH AEPB, Inhale 1 puff into the lungs daily., Disp: 2 each, Rfl: 0 .  guaiFENesin-dextromethorphan (ROBITUSSIN DM) 100-10 MG/5ML syrup, Take 5 mLs by mouth every 4 (four) hours as needed for cough., Disp: , Rfl:  .  levothyroxine (SYNTHROID) 175 MCG tablet, TAKE 1 TABLET BY MOUTH EVERY DAY BEFORE BREAKFAST, Disp: 30 tablet, Rfl: 1 .  lidocaine-prilocaine (EMLA) cream, Apply 1 application topically as needed., Disp: 30 g, Rfl: 1 .  methocarbamol (ROBAXIN) 500 MG tablet, Take 500 mg by mouth daily., Disp: , Rfl:  .  Multiple Vitamins-Minerals (PRESERVISION AREDS 2 PO), Take by mouth., Disp: , Rfl:  .  oxyCODONE-acetaminophen (PERCOCET/ROXICET) 5-325 MG tablet, Take 1 tablet by mouth every 8 (eight) hours as needed for severe pain., Disp: 15 tablet, Rfl: 0 .  senna-docusate (SENNA S) 8.6-50 MG tablet, 1 to 2 twice daily for constipation, Disp: 120 tablet, Rfl: 5  Allergies  Allergen Reactions  . Pravastatin     Numbness in feet   . Iodine Hives and Rash    CT scan contrast caused  rash and itching     Objective:   BP 115/67   Pulse (!) 107   Temp (!) 96.8 F (36 C) (Temporal)   SpO2 98%    Physical Exam Vitals reviewed.  Constitutional:      General: He is not in acute distress.    Appearance: Normal appearance. He is not ill-appearing, toxic-appearing or diaphoretic.  HENT:     Head: Normocephalic and atraumatic.     Right Ear: Tympanic membrane, ear canal and external ear normal. There is no impacted cerumen.     Left Ear: Tympanic membrane, ear canal and external ear normal. There is no impacted cerumen.     Nose: Nose normal. No congestion or rhinorrhea.     Mouth/Throat:     Mouth: Mucous membranes are moist.     Pharynx: Oropharynx is clear. No oropharyngeal exudate or posterior oropharyngeal erythema.  Eyes:     General: No scleral icterus.       Right eye: No discharge.        Left eye: No discharge.     Conjunctiva/sclera: Conjunctivae normal.  Cardiovascular:     Rate and Rhythm: Regular rhythm. Tachycardia present.     Heart sounds: Normal heart sounds. No murmur heard. No friction rub. No gallop.   Pulmonary:     Effort: Pulmonary effort is normal. No respiratory distress.     Breath sounds: Normal breath sounds. No stridor. No wheezing, rhonchi or rales.  Musculoskeletal:        General: Normal range of motion.     Cervical back: Normal range of motion.  Lymphadenopathy:     Cervical: No cervical adenopathy.  Skin:    General: Skin is warm and dry.  Neurological:     Mental Status: He is alert and oriented to person, place, and time. Mental status is at baseline.  Psychiatric:        Mood and Affect: Mood normal.        Behavior: Behavior normal.        Thought Content: Thought content normal.        Judgment: Judgment normal.

## 2020-11-03 NOTE — Telephone Encounter (Signed)
DX code has been added to the rx and resent to the pharmacy so this will be covered by the insurance.

## 2020-11-04 LAB — SARS-COV-2, NAA 2 DAY TAT

## 2020-11-04 LAB — NOVEL CORONAVIRUS, NAA: SARS-CoV-2, NAA: NOT DETECTED

## 2020-11-08 ENCOUNTER — Inpatient Hospital Stay: Payer: Medicare Other | Attending: Oncology

## 2020-11-08 ENCOUNTER — Inpatient Hospital Stay: Payer: Medicare Other

## 2020-11-08 ENCOUNTER — Other Ambulatory Visit: Payer: Self-pay

## 2020-11-08 ENCOUNTER — Encounter: Payer: Self-pay | Admitting: Physician Assistant

## 2020-11-08 ENCOUNTER — Inpatient Hospital Stay (HOSPITAL_BASED_OUTPATIENT_CLINIC_OR_DEPARTMENT_OTHER): Payer: Medicare Other | Admitting: Physician Assistant

## 2020-11-08 VITALS — BP 118/76 | HR 71 | Temp 97.0°F | Resp 17 | Ht 74.0 in | Wt 240.7 lb

## 2020-11-08 DIAGNOSIS — Z95828 Presence of other vascular implants and grafts: Secondary | ICD-10-CM

## 2020-11-08 DIAGNOSIS — Z5112 Encounter for antineoplastic immunotherapy: Secondary | ICD-10-CM | POA: Diagnosis not present

## 2020-11-08 DIAGNOSIS — C3491 Malignant neoplasm of unspecified part of right bronchus or lung: Secondary | ICD-10-CM | POA: Diagnosis not present

## 2020-11-08 DIAGNOSIS — C3412 Malignant neoplasm of upper lobe, left bronchus or lung: Secondary | ICD-10-CM | POA: Diagnosis not present

## 2020-11-08 DIAGNOSIS — Z79899 Other long term (current) drug therapy: Secondary | ICD-10-CM | POA: Insufficient documentation

## 2020-11-08 DIAGNOSIS — I10 Essential (primary) hypertension: Secondary | ICD-10-CM

## 2020-11-08 DIAGNOSIS — E039 Hypothyroidism, unspecified: Secondary | ICD-10-CM | POA: Insufficient documentation

## 2020-11-08 DIAGNOSIS — C7801 Secondary malignant neoplasm of right lung: Secondary | ICD-10-CM

## 2020-11-08 DIAGNOSIS — R5383 Other fatigue: Secondary | ICD-10-CM

## 2020-11-08 LAB — CBC WITH DIFFERENTIAL (CANCER CENTER ONLY)
Abs Immature Granulocytes: 0.04 10*3/uL (ref 0.00–0.07)
Basophils Absolute: 0 10*3/uL (ref 0.0–0.1)
Basophils Relative: 0 %
Eosinophils Absolute: 0 10*3/uL (ref 0.0–0.5)
Eosinophils Relative: 0 %
HCT: 39 % (ref 39.0–52.0)
Hemoglobin: 13.4 g/dL (ref 13.0–17.0)
Immature Granulocytes: 1 %
Lymphocytes Relative: 13 %
Lymphs Abs: 0.9 10*3/uL (ref 0.7–4.0)
MCH: 32.3 pg (ref 26.0–34.0)
MCHC: 34.4 g/dL (ref 30.0–36.0)
MCV: 94 fL (ref 80.0–100.0)
Monocytes Absolute: 0.7 10*3/uL (ref 0.1–1.0)
Monocytes Relative: 11 %
Neutro Abs: 5.1 10*3/uL (ref 1.7–7.7)
Neutrophils Relative %: 75 %
Platelet Count: 197 10*3/uL (ref 150–400)
RBC: 4.15 MIL/uL — ABNORMAL LOW (ref 4.22–5.81)
RDW: 11.8 % (ref 11.5–15.5)
WBC Count: 6.9 10*3/uL (ref 4.0–10.5)
nRBC: 0 % (ref 0.0–0.2)

## 2020-11-08 LAB — CMP (CANCER CENTER ONLY)
ALT: 16 U/L (ref 0–44)
AST: 11 U/L — ABNORMAL LOW (ref 15–41)
Albumin: 3.8 g/dL (ref 3.5–5.0)
Alkaline Phosphatase: 47 U/L (ref 38–126)
Anion gap: 10 (ref 5–15)
BUN: 23 mg/dL (ref 8–23)
CO2: 22 mmol/L (ref 22–32)
Calcium: 8.8 mg/dL — ABNORMAL LOW (ref 8.9–10.3)
Chloride: 105 mmol/L (ref 98–111)
Creatinine: 1.01 mg/dL (ref 0.61–1.24)
GFR, Estimated: >60 mL/min (ref >60)
Glucose, Bld: 107 mg/dL — ABNORMAL HIGH (ref 70–99)
Potassium: 4.5 mmol/L (ref 3.5–5.1)
Sodium: 137 mmol/L (ref 135–145)
Total Bilirubin: 0.6 mg/dL (ref 0.3–1.2)
Total Protein: 7.4 g/dL (ref 6.5–8.1)

## 2020-11-08 LAB — TSH: TSH: 0.493 u[IU]/mL (ref 0.320–4.118)

## 2020-11-08 MED ORDER — SODIUM CHLORIDE 0.9% FLUSH
10.0000 mL | Freq: Once | INTRAVENOUS | Status: AC
  Administered 2020-11-08: 10 mL
  Filled 2020-11-08: qty 10

## 2020-11-08 MED ORDER — SODIUM CHLORIDE 0.9% FLUSH
10.0000 mL | INTRAVENOUS | Status: DC | PRN
  Administered 2020-11-08: 10 mL
  Filled 2020-11-08: qty 10

## 2020-11-08 MED ORDER — SODIUM CHLORIDE 0.9 % IV SOLN
Freq: Once | INTRAVENOUS | Status: AC
  Filled 2020-11-08: qty 250

## 2020-11-08 MED ORDER — HEPARIN SOD (PORK) LOCK FLUSH 100 UNIT/ML IV SOLN
500.0000 [IU] | Freq: Once | INTRAVENOUS | Status: AC | PRN
  Administered 2020-11-08: 500 [IU]
  Filled 2020-11-08: qty 5

## 2020-11-08 MED ORDER — SODIUM CHLORIDE 0.9 % IV SOLN
200.0000 mg | Freq: Once | INTRAVENOUS | Status: AC
  Administered 2020-11-08: 200 mg via INTRAVENOUS
  Filled 2020-11-08: qty 8

## 2020-11-08 NOTE — Patient Instructions (Signed)
[facility] Discharge Instructions for Patients Receiving Chemotherapy  Today you received the following chemotherapy agents: pembrolizumab.  To help prevent nausea and vomiting after your treatment, we encourage you to take your nausea medication as directed.   If you develop nausea and vomiting that is not controlled by your nausea medication, call the clinic.   BELOW ARE SYMPTOMS THAT SHOULD BE REPORTED IMMEDIATELY:  *FEVER GREATER THAN 100.5 F  *CHILLS WITH OR WITHOUT FEVER  NAUSEA AND VOMITING THAT IS NOT CONTROLLED WITH YOUR NAUSEA MEDICATION  *UNUSUAL SHORTNESS OF BREATH  *UNUSUAL BRUISING OR BLEEDING  TENDERNESS IN MOUTH AND THROAT WITH OR WITHOUT PRESENCE OF ULCERS  *URINARY PROBLEMS  *BOWEL PROBLEMS  UNUSUAL RASH Items with * indicate a potential emergency and should be followed up as soon as possible.  Feel free to call the clinic should you have any questions or concerns. The clinic phone number is [phone].  Please show the CHEMO ALERT CARD at check-in to the Emergency Department and triage nurse.   

## 2020-11-09 ENCOUNTER — Telehealth: Payer: Self-pay | Admitting: Internal Medicine

## 2020-11-09 ENCOUNTER — Other Ambulatory Visit: Payer: Medicare Other

## 2020-11-09 ENCOUNTER — Ambulatory Visit: Payer: Medicare Other | Admitting: Physician Assistant

## 2020-11-09 ENCOUNTER — Ambulatory Visit: Payer: Medicare Other

## 2020-11-09 NOTE — Telephone Encounter (Signed)
Scheduled per los. Called and left msg. Mailed printout  °

## 2020-11-18 ENCOUNTER — Telehealth: Payer: Self-pay | Admitting: Cardiology

## 2020-11-18 DIAGNOSIS — E782 Mixed hyperlipidemia: Secondary | ICD-10-CM

## 2020-11-18 NOTE — Telephone Encounter (Signed)
Will send message to pharmacist to see what lab work patient might need at his office visit with Dr. [name], due to him being on Praluent.

## 2020-11-18 NOTE — Telephone Encounter (Signed)
Orders for lipid panel in epic.  LMOM, patient should be fasting prior to blood work

## 2020-11-18 NOTE — Telephone Encounter (Signed)
New Message:     Pt is scheduled to see Dr [name] on 12-08-20. He would like for you  Order lab work for the same day. He is on Praluent and need lab work for that.

## 2020-11-25 ENCOUNTER — Other Ambulatory Visit: Payer: Self-pay | Admitting: Internal Medicine

## 2020-11-25 ENCOUNTER — Ambulatory Visit (INDEPENDENT_AMBULATORY_CARE_PROVIDER_SITE_OTHER): Payer: Medicare Other | Admitting: Family Medicine

## 2020-11-25 ENCOUNTER — Encounter: Payer: Self-pay | Admitting: Family Medicine

## 2020-11-25 DIAGNOSIS — C3491 Malignant neoplasm of unspecified part of right bronchus or lung: Secondary | ICD-10-CM

## 2020-11-25 DIAGNOSIS — J019 Acute sinusitis, unspecified: Secondary | ICD-10-CM

## 2020-11-25 MED ORDER — AMOXICILLIN-POT CLAVULANATE 875-125 MG PO TABS: 1.0000 | ORAL_TABLET | Freq: Two times a day (BID) | ORAL | 0 refills | Status: DC

## 2020-11-25 NOTE — Progress Notes (Signed)
Virtual Visit via Telephone Note  I connected with Travis Padilla on 11/25/20 at 10:43 AM by telephone and verified that I am speaking with the correct person using two identifiers. Travis Padilla is currently located at home and his wife is currently with him during this visit. The provider, Loman Brooklyn, FNP is located in their home at time of visit.  I discussed the limitations, risks, security and privacy concerns of performing an evaluation and management service by telephone and the availability of in person appointments. I also discussed with the patient that there may be a patient responsible charge related to this service. The patient expressed understanding and agreed to proceed.  Subjective: PCP: Travis Norlander, DO  Chief Complaint  Patient presents with  . URI   Patient complains of headache and sore throat. Additional symptoms include cough, head congestion, sneezing, facial pain/pressure and postnasal drainage. Onset of symptoms was 2 weeks ago, gradually worsening since that time. He is drinking plenty of fluids. Evaluation to date: seen previously and thought to have a viral URI. Treatment to date: nasal steroids and Tylenol & Trelegy. He has a history of lung cancer. He does not smoke.    ROS: Per HPI  Current Outpatient Medications:  .  albuterol (PROVENTIL) (2.5 MG/3ML) 0.083% nebulizer solution, Inhale 3 mLs (2.5 mg total) into the lungs every 6 (six) hours as needed for wheezing or shortness of breath., Disp: 120 mL, Rfl: 11 .  Alirocumab (PRALUENT) 150 MG/ML SOAJ, Inject 150 mg into the skin every 14 (fourteen) days., Disp: 2 pen, Rfl: 11 .  ALPRAZolam (XANAX) 0.25 MG tablet, Take 1 tablet (0.25 mg total) by mouth at bedtime as needed for anxiety., Disp: 30 tablet, Rfl: 0 .  Artificial Tear Solution (SOOTHE XP OP), Place 1 drop into both eyes 2 (two) times daily., Disp: , Rfl:  .  azelastine (ASTELIN) 0.1 % nasal spray, Place 1 spray into both nostrils 2 (two)  times daily., Disp: 30 mL, Rfl: 12 .  carvedilol (COREG) 12.5 MG tablet, Take 1 tablet (12.5 mg total) by mouth in the morning, at noon, and at bedtime., Disp: 270 tablet, Rfl: 3 .  Cyanocobalamin (VITAMIN B 12) 250 MCG LOZG, Take by mouth., Disp: , Rfl:  .  diphenhydramine-acetaminophen (TYLENOL PM) 25-500 MG TABS tablet, Take 1-2 tablets by mouth at bedtime as needed (pain)., Disp: , Rfl:  .  ferrous sulfate 324 MG TBEC, Take 324 mg by mouth daily with breakfast., Disp: , Rfl:  .  fluticasone (FLONASE) 50 MCG/ACT nasal spray, Place 1 spray into both nostrils daily., Disp: 16 g, Rfl: 3 .  Fluticasone-Umeclidin-Vilant (TRELEGY ELLIPTA) 100-62.5-25 MCG/INH AEPB, Inhale 1 puff into the lungs daily., Disp: 60 each, Rfl: 6 .  Fluticasone-Umeclidin-Vilant (TRELEGY ELLIPTA) 100-62.5-25 MCG/INH AEPB, Inhale 1 puff into the lungs daily., Disp: 2 each, Rfl: 0 .  guaiFENesin-dextromethorphan (ROBITUSSIN DM) 100-10 MG/5ML syrup, Take 5 mLs by mouth every 4 (four) hours as needed for cough., Disp: , Rfl:  .  levothyroxine (SYNTHROID) 175 MCG tablet, TAKE 1 TABLET BY MOUTH EVERY DAY BEFORE BREAKFAST, Disp: 30 tablet, Rfl: 1 .  lidocaine-prilocaine (EMLA) cream, Apply 1 application topically as needed., Disp: 30 g, Rfl: 1 .  methocarbamol (ROBAXIN) 500 MG tablet, Take 500 mg by mouth daily., Disp: , Rfl:  .  Multiple Vitamins-Minerals (PRESERVISION AREDS 2 PO), Take by mouth., Disp: , Rfl:  .  oxyCODONE-acetaminophen (PERCOCET/ROXICET) 5-325 MG tablet, Take 1 tablet by mouth every 8 (  eight) hours as needed for severe pain., Disp: 15 tablet, Rfl: 0 .  senna-docusate (SENNA S) 8.6-50 MG tablet, 1 to 2 twice daily for constipation, Disp: 120 tablet, Rfl: 5  Allergies  Allergen Reactions  . Pravastatin     Numbness in feet   . Iodine Hives and Rash    CT scan contrast caused rash and itching    Past Medical History:  Diagnosis Date  . Abnormal nuclear stress test    December, 2013  . Anemia   . Axillary  adenopathy 04/24/2018  . CAD (coronary artery disease)    Mild nonobstructive plaque in cath 2013  . Chest pain    December, 2013  . Cough with hemoptysis 04/22/2018  . Dizziness   . Dyslipidemia 07/14/2019  . Encounter for antineoplastic immunotherapy 05/13/2018  . Gout   . Hemorrhoid   . Hyperlipidemia   . Hypertension   . Hypothyroidism (acquired) 08/11/2018  . 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 Obesity, unspecified 03/28/2009   Qualifier: Diagnosis of  By: [name], MD, [name]    . Palpitations 03/28/2009   Qualifier: Diagnosis of  By: [name], MD, [name]    . Right lower lobe lung mass 04/22/2018  . Skin cancer   . SNORING 03/28/2009   Qualifier: Diagnosis of  By: [name], MD, [name]    . Spinal stenosis   . 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 Acute non-recurrent sinusitis, unspecified location Continue symptom management at home as well.  - amoxicillin-clavulanate (AUGMENTIN) 875-125 MG tablet; Take 1 tablet by mouth 2 (two) times daily for 7 days.  Dispense: 14 tablet; Refill: 0   Follow Up Instructions:  I discussed the assessment and treatment plan with the patient. The patient was provided an opportunity to ask questions and all were answered. The patient agreed with the plan and demonstrated an understanding of the instructions.   The patient was advised to call back or seek an in-person evaluation if the symptoms worsen or if the condition fails to improve as anticipated.  The above assessment and management plan was discussed with the patient. The patient verbalized understanding of and has agreed to the management plan. Patient is  aware to call the clinic if symptoms persist or worsen. Patient is aware when to return to the clinic for a follow-up visit. Patient educated on when it is appropriate to go to the emergency department.   Time call ended: 10:54 AM  I provided 11 minutes of non-face-to-face time during this encounter.  Hendricks Limes, MSN, APRN, FNP-C [facility] 11/25/20

## 2020-11-26 ENCOUNTER — Emergency Department (HOSPITAL_COMMUNITY): Payer: Medicare Other

## 2020-11-26 ENCOUNTER — Other Ambulatory Visit: Payer: Self-pay

## 2020-11-26 ENCOUNTER — Encounter (HOSPITAL_COMMUNITY): Payer: Self-pay

## 2020-11-26 ENCOUNTER — Inpatient Hospital Stay (HOSPITAL_COMMUNITY)
Admission: EM | Admit: 2020-11-26 | Discharge: 2020-12-02 | DRG: 097 | Disposition: A | Discharge status: Home / Self Care | Payer: Medicare Other | Attending: Internal Medicine | Admitting: Internal Medicine

## 2020-11-26 DIAGNOSIS — J449 Chronic obstructive pulmonary disease, unspecified: Secondary | ICD-10-CM | POA: Diagnosis present

## 2020-11-26 DIAGNOSIS — M109 Gout, unspecified: Secondary | ICD-10-CM | POA: Diagnosis present

## 2020-11-26 DIAGNOSIS — Z7951 Long term (current) use of inhaled steroids: Secondary | ICD-10-CM | POA: Diagnosis not present

## 2020-11-26 DIAGNOSIS — Z79899 Other long term (current) drug therapy: Secondary | ICD-10-CM

## 2020-11-26 DIAGNOSIS — Z808 Family history of malignant neoplasm of other organs or systems: Secondary | ICD-10-CM

## 2020-11-26 DIAGNOSIS — W57XXXA Bitten or stung by nonvenomous insect and other nonvenomous arthropods, initial encounter: Secondary | ICD-10-CM | POA: Diagnosis present

## 2020-11-26 DIAGNOSIS — I119 Hypertensive heart disease without heart failure: Secondary | ICD-10-CM | POA: Diagnosis present

## 2020-11-26 DIAGNOSIS — E039 Hypothyroidism, unspecified: Secondary | ICD-10-CM | POA: Diagnosis present

## 2020-11-26 DIAGNOSIS — D849 Immunodeficiency, unspecified: Secondary | ICD-10-CM | POA: Diagnosis present

## 2020-11-26 DIAGNOSIS — R509 Fever, unspecified: Secondary | ICD-10-CM | POA: Diagnosis not present

## 2020-11-26 DIAGNOSIS — C7801 Secondary malignant neoplasm of right lung: Secondary | ICD-10-CM | POA: Diagnosis not present

## 2020-11-26 DIAGNOSIS — R6520 Severe sepsis without septic shock: Secondary | ICD-10-CM | POA: Diagnosis not present

## 2020-11-26 DIAGNOSIS — C3491 Malignant neoplasm of unspecified part of right bronchus or lung: Secondary | ICD-10-CM | POA: Diagnosis present

## 2020-11-26 DIAGNOSIS — Z91041 Radiographic dye allergy status: Secondary | ICD-10-CM

## 2020-11-26 DIAGNOSIS — I10 Essential (primary) hypertension: Secondary | ICD-10-CM | POA: Diagnosis present

## 2020-11-26 DIAGNOSIS — G9341 Metabolic encephalopathy: Secondary | ICD-10-CM | POA: Diagnosis present

## 2020-11-26 DIAGNOSIS — G0490 Encephalitis and encephalomyelitis, unspecified: Secondary | ICD-10-CM | POA: Diagnosis not present

## 2020-11-26 DIAGNOSIS — Z888 Allergy status to other drugs, medicaments and biological substances status: Secondary | ICD-10-CM

## 2020-11-26 DIAGNOSIS — G919 Hydrocephalus, unspecified: Secondary | ICD-10-CM | POA: Diagnosis not present

## 2020-11-26 DIAGNOSIS — D696 Thrombocytopenia, unspecified: Secondary | ICD-10-CM

## 2020-11-26 DIAGNOSIS — R4182 Altered mental status, unspecified: Secondary | ICD-10-CM | POA: Diagnosis not present

## 2020-11-26 DIAGNOSIS — N171 Acute kidney failure with acute cortical necrosis: Secondary | ICD-10-CM

## 2020-11-26 DIAGNOSIS — N179 Acute kidney failure, unspecified: Secondary | ICD-10-CM

## 2020-11-26 DIAGNOSIS — G620 Drug-induced polyneuropathy: Secondary | ICD-10-CM | POA: Diagnosis present

## 2020-11-26 DIAGNOSIS — Z8249 Family history of ischemic heart disease and other diseases of the circulatory system: Secondary | ICD-10-CM | POA: Diagnosis not present

## 2020-11-26 DIAGNOSIS — W57XXXD Bitten or stung by nonvenomous insect and other nonvenomous arthropods, subsequent encounter: Secondary | ICD-10-CM | POA: Diagnosis not present

## 2020-11-26 DIAGNOSIS — Z20822 Contact with and (suspected) exposure to covid-19: Secondary | ICD-10-CM | POA: Diagnosis not present

## 2020-11-26 DIAGNOSIS — Z6831 Body mass index (BMI) 31.0-31.9, adult: Secondary | ICD-10-CM

## 2020-11-26 DIAGNOSIS — E785 Hyperlipidemia, unspecified: Secondary | ICD-10-CM | POA: Diagnosis present

## 2020-11-26 DIAGNOSIS — R410 Disorientation, unspecified: Secondary | ICD-10-CM | POA: Diagnosis not present

## 2020-11-26 DIAGNOSIS — I639 Cerebral infarction, unspecified: Secondary | ICD-10-CM | POA: Diagnosis not present

## 2020-11-26 DIAGNOSIS — Z825 Family history of asthma and other chronic lower respiratory diseases: Secondary | ICD-10-CM | POA: Diagnosis not present

## 2020-11-26 DIAGNOSIS — I2510 Atherosclerotic heart disease of native coronary artery without angina pectoris: Secondary | ICD-10-CM | POA: Diagnosis present

## 2020-11-26 DIAGNOSIS — O85 Puerperal sepsis: Secondary | ICD-10-CM

## 2020-11-26 DIAGNOSIS — Z87891 Personal history of nicotine dependence: Secondary | ICD-10-CM | POA: Diagnosis not present

## 2020-11-26 DIAGNOSIS — A419 Sepsis, unspecified organism: Secondary | ICD-10-CM | POA: Diagnosis not present

## 2020-11-26 DIAGNOSIS — R0902 Hypoxemia: Secondary | ICD-10-CM | POA: Diagnosis not present

## 2020-11-26 DIAGNOSIS — R0689 Other abnormalities of breathing: Secondary | ICD-10-CM | POA: Diagnosis not present

## 2020-11-26 DIAGNOSIS — S40862A Insect bite (nonvenomous) of left upper arm, initial encounter: Secondary | ICD-10-CM | POA: Diagnosis not present

## 2020-11-26 DIAGNOSIS — C7800 Secondary malignant neoplasm of unspecified lung: Secondary | ICD-10-CM | POA: Diagnosis present

## 2020-11-26 DIAGNOSIS — R6510 Systemic inflammatory response syndrome (SIRS) of non-infectious origin without acute organ dysfunction: Secondary | ICD-10-CM

## 2020-11-26 DIAGNOSIS — R404 Transient alteration of awareness: Secondary | ICD-10-CM | POA: Diagnosis not present

## 2020-11-26 DIAGNOSIS — J9811 Atelectasis: Secondary | ICD-10-CM | POA: Diagnosis not present

## 2020-11-26 DIAGNOSIS — E8770 Fluid overload, unspecified: Secondary | ICD-10-CM | POA: Diagnosis present

## 2020-11-26 DIAGNOSIS — I517 Cardiomegaly: Secondary | ICD-10-CM | POA: Diagnosis not present

## 2020-11-26 DIAGNOSIS — Z85828 Personal history of other malignant neoplasm of skin: Secondary | ICD-10-CM | POA: Diagnosis not present

## 2020-11-26 DIAGNOSIS — Z79890 Hormone replacement therapy (postmenopausal): Secondary | ICD-10-CM | POA: Diagnosis not present

## 2020-11-26 DIAGNOSIS — J018 Other acute sinusitis: Secondary | ICD-10-CM | POA: Diagnosis not present

## 2020-11-26 DIAGNOSIS — Z95828 Presence of other vascular implants and grafts: Secondary | ICD-10-CM

## 2020-11-26 DIAGNOSIS — J0100 Acute maxillary sinusitis, unspecified: Secondary | ICD-10-CM | POA: Diagnosis not present

## 2020-11-26 DIAGNOSIS — G971 Other reaction to spinal and lumbar puncture: Secondary | ICD-10-CM | POA: Diagnosis not present

## 2020-11-26 DIAGNOSIS — R836 Abnormal cytological findings in cerebrospinal fluid: Secondary | ICD-10-CM | POA: Diagnosis not present

## 2020-11-26 DIAGNOSIS — J329 Chronic sinusitis, unspecified: Secondary | ICD-10-CM | POA: Diagnosis present

## 2020-11-26 DIAGNOSIS — E669 Obesity, unspecified: Secondary | ICD-10-CM | POA: Diagnosis present

## 2020-11-26 LAB — COMPREHENSIVE METABOLIC PANEL
ALT: 15 U/L (ref 0–44)
AST: 19 U/L (ref 15–41)
Albumin: 3.9 g/dL (ref 3.5–5.0)
Alkaline Phosphatase: 39 U/L (ref 38–126)
Anion gap: 11 (ref 5–15)
BUN: 19 mg/dL (ref 8–23)
CO2: 22 mmol/L (ref 22–32)
Calcium: 8.7 mg/dL — ABNORMAL LOW (ref 8.9–10.3)
Chloride: 106 mmol/L (ref 98–111)
Creatinine, Ser: 1.19 mg/dL (ref 0.61–1.24)
GFR, Estimated: >60 mL/min (ref >60)
Glucose, Bld: 136 mg/dL — ABNORMAL HIGH (ref 70–99)
Potassium: 3.6 mmol/L (ref 3.5–5.1)
Sodium: 139 mmol/L (ref 135–145)
Total Bilirubin: 0.9 mg/dL (ref 0.3–1.2)
Total Protein: 7.6 g/dL (ref 6.5–8.1)

## 2020-11-26 LAB — URINALYSIS, ROUTINE W REFLEX MICROSCOPIC
Bilirubin Urine: NEGATIVE
Glucose, UA: NEGATIVE mg/dL
Hgb urine dipstick: NEGATIVE
Ketones, ur: 20 mg/dL — AB
Leukocytes,Ua: NEGATIVE
Nitrite: NEGATIVE
Protein, ur: 100 mg/dL — AB
Specific Gravity, Urine: 1.028 (ref 1.005–1.030)
pH: 5 (ref 5.0–8.0)

## 2020-11-26 LAB — CBC WITH DIFFERENTIAL/PLATELET
Abs Immature Granulocytes: 0.05 10*3/uL (ref 0.00–0.07)
Basophils Absolute: 0 10*3/uL (ref 0.0–0.1)
Basophils Relative: 0 %
Eosinophils Absolute: 0 10*3/uL (ref 0.0–0.5)
Eosinophils Relative: 0 %
HCT: 38.9 % — ABNORMAL LOW (ref 39.0–52.0)
Hemoglobin: 13.2 g/dL (ref 13.0–17.0)
Immature Granulocytes: 1 %
Lymphocytes Relative: 12 %
Lymphs Abs: 1 10*3/uL (ref 0.7–4.0)
MCH: 32.6 pg (ref 26.0–34.0)
MCHC: 33.9 g/dL (ref 30.0–36.0)
MCV: 96 fL (ref 80.0–100.0)
Monocytes Absolute: 0.8 10*3/uL (ref 0.1–1.0)
Monocytes Relative: 10 %
Neutro Abs: 6.6 10*3/uL (ref 1.7–7.7)
Neutrophils Relative %: 77 %
Platelets: 105 10*3/uL — ABNORMAL LOW (ref 150–400)
RBC: 4.05 MIL/uL — ABNORMAL LOW (ref 4.22–5.81)
RDW: 11.9 % (ref 11.5–15.5)
WBC: 8.4 10*3/uL (ref 4.0–10.5)
nRBC: 0 % (ref 0.0–0.2)

## 2020-11-26 LAB — CBG MONITORING, ED: Glucose-Capillary: 124 mg/dL — ABNORMAL HIGH (ref 70–99)

## 2020-11-26 LAB — RESP PANEL BY RT-PCR (FLU A&B, COVID) ARPGX2
Influenza A by PCR: NEGATIVE
Influenza B by PCR: NEGATIVE
SARS Coronavirus 2 by RT PCR: NEGATIVE

## 2020-11-26 LAB — APTT: aPTT: 30 seconds (ref 24–36)

## 2020-11-26 LAB — PROTIME-INR
INR: 1.2 (ref 0.8–1.2)
Prothrombin Time: 14.7 seconds (ref 11.4–15.2)

## 2020-11-26 LAB — LACTIC ACID, PLASMA
Lactic Acid, Venous: 1.6 mmol/L (ref 0.5–1.9)
Lactic Acid, Venous: 1.5 mmol/L (ref 0.5–1.9)

## 2020-11-26 LAB — PROCALCITONIN: Procalcitonin: 0.2 ng/mL

## 2020-11-26 MED ORDER — DEXTROSE 5 % IV SOLN
10.0000 mg/kg | Freq: Three times a day (TID) | INTRAVENOUS | Status: DC
  Administered 2020-11-26 – 2020-11-27 (×4): 935 mg via INTRAVENOUS
  Filled 2020-11-26 (×9): qty 18.7

## 2020-11-26 MED ORDER — LACTATED RINGERS IV BOLUS (SEPSIS)
500.0000 mL | Freq: Once | INTRAVENOUS | Status: AC
  Administered 2020-11-26: 500 mL via INTRAVENOUS

## 2020-11-26 MED ORDER — VANCOMYCIN HCL 750 MG/150ML IV SOLN
750.0000 mg | Freq: Two times a day (BID) | INTRAVENOUS | Status: DC
  Administered 2020-11-27 (×2): 750 mg via INTRAVENOUS
  Filled 2020-11-26 (×5): qty 150

## 2020-11-26 MED ORDER — KETOROLAC TROMETHAMINE 15 MG/ML IJ SOLN
15.0000 mg | Freq: Once | INTRAMUSCULAR | Status: AC
  Administered 2020-11-27: 15 mg via INTRAVENOUS
  Filled 2020-11-26: qty 1

## 2020-11-26 MED ORDER — ACETAMINOPHEN 325 MG PO TABS: 650.0000 mg | ORAL_TABLET | Freq: Four times a day (QID) | ORAL | Status: DC | PRN

## 2020-11-26 MED ORDER — VANCOMYCIN HCL IN DEXTROSE 1-5 GM/200ML-% IV SOLN
1000.0000 mg | Freq: Once | INTRAVENOUS | Status: AC
  Administered 2020-11-26: 1000 mg via INTRAVENOUS
  Filled 2020-11-26: qty 200

## 2020-11-26 MED ORDER — LORAZEPAM 2 MG/ML IJ SOLN
1.0000 mg | Freq: Once | INTRAMUSCULAR | Status: AC
  Administered 2020-11-26: 1 mg via INTRAVENOUS
  Filled 2020-11-26: qty 1

## 2020-11-26 MED ORDER — LACTATED RINGERS IV SOLN: INTRAVENOUS | Status: DC

## 2020-11-26 MED ORDER — LACTATED RINGERS IV BOLUS (SEPSIS)
1000.0000 mL | Freq: Once | INTRAVENOUS | Status: AC
  Administered 2020-11-26: 1000 mL via INTRAVENOUS

## 2020-11-26 MED ORDER — DEXAMETHASONE SODIUM PHOSPHATE 10 MG/ML IJ SOLN
10.0000 mg | Freq: Once | INTRAMUSCULAR | Status: AC
  Administered 2020-11-26: 10 mg via INTRAVENOUS
  Filled 2020-11-26: qty 1

## 2020-11-26 MED ORDER — ACYCLOVIR SODIUM 50 MG/ML IV SOLN
INTRAVENOUS | Status: AC
  Filled 2020-11-26: qty 10

## 2020-11-26 MED ORDER — ACETAMINOPHEN 650 MG RE SUPP
650.0000 mg | Freq: Once | RECTAL | Status: AC
  Administered 2020-11-26: 650 mg via RECTAL
  Filled 2020-11-26: qty 1

## 2020-11-26 MED ORDER — SODIUM CHLORIDE 0.9 % IV SOLN
2.0000 g | Freq: Once | INTRAVENOUS | Status: AC
  Administered 2020-11-26: 2 g via INTRAVENOUS
  Filled 2020-11-26: qty 2

## 2020-11-26 MED ORDER — IPRATROPIUM-ALBUTEROL 0.5-2.5 (3) MG/3ML IN SOLN
3.0000 mL | Freq: Once | RESPIRATORY_TRACT | Status: AC
  Administered 2020-11-26: 3 mL via RESPIRATORY_TRACT
  Filled 2020-11-26: qty 3

## 2020-11-26 MED ORDER — ACETAMINOPHEN 650 MG RE SUPP
650.0000 mg | Freq: Four times a day (QID) | RECTAL | Status: DC | PRN
  Filled 2020-11-26 (×2): qty 1

## 2020-11-26 MED ORDER — SODIUM CHLORIDE 0.9 % IV SOLN
2.0000 g | Freq: Three times a day (TID) | INTRAVENOUS | Status: DC
  Administered 2020-11-27 (×3): 2 g via INTRAVENOUS
  Filled 2020-11-26 (×3): qty 2

## 2020-11-26 MED ORDER — LORAZEPAM 2 MG/ML IJ SOLN
1.0000 mg | Freq: Once | INTRAMUSCULAR | Status: AC | PRN
  Administered 2020-11-27: 1 mg via INTRAVENOUS
  Filled 2020-11-26: qty 1

## 2020-11-26 MED ORDER — SODIUM CHLORIDE 0.9 % IV SOLN
2.0000 g | INTRAVENOUS | Status: DC
  Administered 2020-11-26 – 2020-11-28 (×7): 2 g via INTRAVENOUS
  Filled 2020-11-26 (×4): qty 2000
  Filled 2020-11-26: qty 2
  Filled 2020-11-26: qty 2000
  Filled 2020-11-26: qty 2
  Filled 2020-11-26 (×7): qty 2000
  Filled 2020-11-26: qty 2
  Filled 2020-11-26: qty 2000
  Filled 2020-11-26: qty 2

## 2020-11-26 MED ORDER — HALOPERIDOL LACTATE 5 MG/ML IJ SOLN
2.0000 mg | Freq: Four times a day (QID) | INTRAMUSCULAR | Status: DC | PRN
  Administered 2020-11-26: 2 mg via INTRAVENOUS
  Filled 2020-11-26 (×2): qty 1

## 2020-11-26 NOTE — ED Notes (Signed)
Dr Landis Gandy at bedside. Pt spouse on way to visit with pt and talk with DR.

## 2020-11-26 NOTE — Progress Notes (Signed)
Pharmacy Antibiotic Note  Travis Padilla is a 75 y.o. male admitted on 11/26/2020 with sepsis.  Pharmacy has been consulted for vancomycin and cefepime dosing.  Plan: Vancomycin 750mg   IV every 12 hours.  Goal trough 15-20 mcg/mL. cefepime 2gm iv q8h  Height: 6\' 2"  (188 cm) Weight: 110 kg (242 lb 8.1 oz) IBW/kg (Calculated) : 82.2  Temp (24hrs), Avg:103.6 F (39.8 C), Min:103.6 F (39.8 C), Max:103.6 F (39.8 C)  No results for input(s): WBC, CREATININE, LATICACIDVEN, VANCOTROUGH, VANCOPEAK, VANCORANDOM, GENTTROUGH, GENTPEAK, GENTRANDOM, TOBRATROUGH, TOBRAPEAK, TOBRARND, AMIKACINPEAK, AMIKACINTROU, AMIKACIN in the last 168 hours.  Estimated Creatinine Clearance: 86 mL/min (by C-G formula based on SCr of 1.01 mg/dL).    Allergies  Allergen Reactions  . Pravastatin     Numbness in feet   . Iodine Hives and Rash    CT scan contrast caused rash and itching     Antimicrobials this admission: 3/19 cefepime >>  3/19 vancomycin >>   Microbiology results: 3/11 BCx: pending  3/11 UCx: sent    Thank you for allowing pharmacy to be a part of this patient's care.  Donna Christen Malarie Tappen 11/26/2020 7:17 PM

## 2020-11-26 NOTE — ED Notes (Signed)
Pt continues to be restless in bed pulling at IV lines and monitor leads.

## 2020-11-26 NOTE — ED Notes (Signed)
Pt spouse notified of pt status and room number at [facility].

## 2020-11-26 NOTE — Progress Notes (Signed)
elink monitoring sepsis

## 2020-11-26 NOTE — H&P (Signed)
[facility] H&P   Patient Demographics:    Travis Padilla, is a 75 y.o. male  MRN: [id number]   DOB - 09/30/1945  Admit Date - 11/26/2020  Outpatient Primary MD for the patient is [name], DO  Referring MD/NP/PA: Dr Roderic Palau  Outpatient Specialists: Oncology Dr Earlie Server  Patient coming from: Home   Chief Complaint  Patient presents with  . Weakness      HPI:    Travis Padilla  is a 75 y.o. male, with PMH of stage IV lung cancer, on immunotherapy by Dr. Earlie Server, city, hypothyroidism, CAD, anemia, patient was brought by his wife to ED secondary to altered mental status, generalized weakness and fever per wife patient was at his usual state of health on Thursday, he had poor sleep on Thursday evening, yesterday he was complaining of sore throat, cough, nasal congestion and sinuses, had virtual visit by PCP where he was started on Augmentin, reports earlier today he was sleeping all day, more weak, frail, he was not talking this evening around 6:30 PM which prompted her to call EMS, patient was febrile 103.6 in ED, she does mention he had complaints of headache, she she reports he never complained of photophobia, he has chronic neck pain, as well she does report he had some worsening complaints of neck pain, as well had complaints of lower back pain. - in ED febrile 103.6, significantly restless and agitated required Ativan, CT head with no acute findings, chest x-ray with cardiomegaly with vascular congestion, with perihilar opacities could reflect edema, atelectasis or infiltrate, he had negative urine analysis, patient was started on broad-spectrum antibiotics and [facility] hospitalist consulted to admit.    Review of systems:    Unable to obtain appropriate review of systems given significantly altered mental status.   With Past History of the following :    Past Medical History:   Diagnosis Date  . Abnormal nuclear stress test    December, 2013  . Anemia   . Axillary adenopathy 04/24/2018  . CAD (coronary artery disease)    Mild nonobstructive plaque in cath 2013  . Chest pain    December, 2013  . Cough with hemoptysis 04/22/2018  . Dizziness   . Dyslipidemia 07/14/2019  . Encounter for antineoplastic immunotherapy 05/13/2018  . Gout   . Hemorrhoid   . Hyperlipidemia   . Hypertension   . Hypothyroidism (acquired) 08/11/2018  . 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 Obesity, unspecified 03/28/2009   Qualifier: Diagnosis of  By: [name], MD, [name]    . Palpitations 03/28/2009   Qualifier: Diagnosis of  By: [name], MD, [name]    . Right lower lobe lung mass 04/22/2018  . Skin cancer   . SNORING 03/28/2009  Qualifier: Diagnosis of  By: [name], MD, [name]    . Spinal stenosis   . 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 BELPHAROPTOSIS REPAIR Bilateral   . CATARACT EXTRACTION W/ INTRAOCULAR LENS  IMPLANT, BILATERAL    . COLONOSCOPY N/A 11/03/2014   Procedure: COLONOSCOPY;  Surgeon: Rogene Houston, MD;  Location: [facility];  Service: Endoscopy;  Laterality: N/A;  1225  . IR CV LINE INJECTION  08/24/2020  . IR IMAGING GUIDED PORT INSERTION  07/11/2018  . KNEE CARTILAGE SURGERY Left    Left knee  . SKIN CANCER EXCISION  12/2014, 04/25/15  . VIDEO BRONCHOSCOPY Bilateral 05/09/2018   Procedure: VIDEO BRONCHOSCOPY WITH FLUORO;  Surgeon: Juanito Doom, MD;  Location: [facility] ENDOSCOPY;  Service: Cardiopulmonary;  Laterality: Bilateral;      Social History:     Social History   Tobacco Use  . Smoking status: Former Smoker    Packs/day: 0.20    Years: 2.00    Pack years: 0.40    Types:  Cigarettes    Start date: 10/01/1968  . Smokeless tobacco: Never Used  Substance Use Topics  . Alcohol use: No    Comment: hx of        Family History :     Family History  Problem Relation Age of Onset  . Heart attack Father   . Heart failure Mother   . Parkinson's disease Mother   . Healthy Sister   . Skin cancer Sister   . Neuropathy Brother   . COPD Brother   . Epilepsy Brother   . Healthy Sister   . Healthy Sister   . Colon cancer Neg Hx      Home Medications:   Prior to Admission medications   Medication Sig Start Date End Date Taking? Authorizing Provider  acetaminophen (TYLENOL) 500 MG tablet Take 1,000 mg by mouth every 6 (six) hours as needed.   Yes [provider]  albuterol (PROVENTIL) (2.5 MG/3ML) 0.083% nebulizer solution Inhale 3 mLs (2.5 mg total) into the lungs every 6 (six) hours as needed for wheezing or shortness of breath. 11/03/20 11/03/21 Yes Icard, Octavio Graves, DO  Alirocumab (PRALUENT) 150 MG/ML SOAJ Inject 150 mg into the skin every 14 (fourteen) days. 04/11/20  Yes [name], MD  ALPRAZolam Duanne Moron) 0.25 MG tablet Take 1 tablet (0.25 mg total) by mouth at bedtime as needed for anxiety. 10/19/20  Yes Curt Bears, MD  amoxicillin-clavulanate (AUGMENTIN) 875-125 MG tablet Take 1 tablet by mouth 2 (two) times daily for 7 days. 11/25/20 12/02/20 Yes Hendricks Limes F, FNP  Artificial Tear Solution (SOOTHE XP OP) Place 1 drop into both eyes 2 (two) times daily.   Yes [provider]  carvedilol (COREG) 12.5 MG tablet Take 1 tablet (12.5 mg total) by mouth in the morning, at noon, and at bedtime. 01/29/20  Yes [name], MD  ferrous sulfate 324 MG TBEC Take 324 mg by mouth daily with breakfast.   Yes [provider]  fluticasone (FLONASE) 50 MCG/ACT nasal spray Place 1 spray into both nostrils daily. 07/01/20  Yes Lauraine Rinne, NP  Fluticasone-Umeclidin-Vilant (TRELEGY ELLIPTA) 100-62.5-25 MCG/INH AEPB Inhale 1 puff into the  lungs daily. 09/20/20  Yes Icard, Octavio Graves, DO  levothyroxine (SYNTHROID) 175 MCG tablet TAKE 1 TABLET BY MOUTH EVERY DAY BEFORE BREAKFAST  Patient taking differently: Take 175 mcg by mouth daily before breakfast. 11/26/20  Yes Curt Bears, MD  lidocaine-prilocaine (EMLA) cream Apply 1 application topically as needed. 04/21/19  Yes Heilingoetter, Cassandra L, PA-C  methocarbamol (ROBAXIN) 500 MG tablet Take 500 mg by mouth daily. 08/11/19  Yes [provider]  oxyCODONE-acetaminophen (PERCOCET/ROXICET) 5-325 MG tablet Take 1 tablet by mouth every 8 (eight) hours as needed for severe pain. 08/16/20  Yes Heilingoetter, Cassandra L, PA-C  senna-docusate (SENNA S) 8.6-50 MG tablet 1 to 2 twice daily for constipation 07/07/18  Yes Tanner, Lyndon Code., PA-C  azelastine (ASTELIN) 0.1 % nasal spray Place 1 spray into both nostrils 2 (two) times daily. Patient not taking: No sig reported 07/27/20   [name], DO  Cyanocobalamin (VITAMIN B 12) 250 MCG LOZG Take by mouth. Patient not taking: No sig reported    [provider]  diphenhydramine-acetaminophen (TYLENOL PM) 25-500 MG TABS tablet Take 1-2 tablets by mouth at bedtime as needed (pain). Patient not taking: Reported on 11/26/2020    [provider]  Fluticasone-Umeclidin-Vilant (TRELEGY ELLIPTA) 100-62.5-25 MCG/INH AEPB Inhale 1 puff into the lungs daily. Patient not taking: No sig reported 09/20/20   Icard, Bradley L, DO  guaiFENesin-dextromethorphan (ROBITUSSIN DM) 100-10 MG/5ML syrup Take 5 mLs by mouth every 4 (four) hours as needed for cough. Patient not taking: No sig reported    [provider]  Multiple Vitamins-Minerals (PRESERVISION AREDS 2 PO) Take by mouth. Patient not taking: No sig reported    [provider]     Allergies:     Allergies  Allergen Reactions  . Pravastatin     Numbness in feet   . Iodine Hives and Rash    CT scan contrast caused rash and itching      Physical  Exam:   Vitals  Blood pressure (!) 143/90, pulse 98, temperature (!) 101.48 F (38.6 C), resp. rate (!) 24, height 6\' 2"  (1.88 m), weight 110 kg, SpO2 95 %.   1. General sick appearing male, laying in bed, restless, uncomfortable  2.  Significantly altered, does not follow commands or answer any questions .  3.  Able to perform appropriate physical exam given his altered mental status, but he is moving all extremities with no deficits .  4. Ears and Eyes appear Normal, Conjunctivae clear, unable to evaluate oral cavity or rinks or tonsils as refusing to open his mouth despite multiple attempts.  5. Supple Neck, No JVD, No cervical lymphadenopathy appriciated, No Carotid Bruits.  6. Symmetrical Chest wall movement, Good air movement bilaterally, CTAB.  7.  Tachycardic, No Gallops, Rubs or Murmurs, No Parasternal Heave.  8. Positive Bowel Sounds, Abdomen Soft, No tenderness, No rebound -guarding or rigidity.  9.  No Cyanosis, Normal Skin Turgor, No Skin Rash or Bruise.  10. Good muscle tone,  joints appear normal , no effusions.     Data Review:    CBC Recent Labs  Lab 11/26/20 1845  WBC 8.4  HGB 13.2  HCT 38.9*  PLT 105*  MCV 96.0  MCH 32.6  MCHC 33.9  RDW 11.9  LYMPHSABS 1.0  MONOABS 0.8  EOSABS 0.0  BASOSABS 0.0   ------------------------------------------------------------------------------------------------------------------  Chemistries  Recent Labs  Lab 11/26/20 1845  NA 139  K 3.6  CL 106  CO2 22  GLUCOSE 136*  BUN 19  CREATININE 1.19  CALCIUM 8.7*  AST 19  ALT 15  ALKPHOS 39  BILITOT 0.9   ------------------------------------------------------------------------------------------------------------------ estimated creatinine  clearance is 73 mL/min (by C-G formula based on SCr of 1.19 mg/dL). ------------------------------------------------------------------------------------------------------------------ No results for input(s): TSH,  T4TOTAL, T3FREE, THYROIDAB in the last 72 hours.  Invalid input(s): FREET3  Coagulation profile Recent Labs  Lab 11/26/20 1845  INR 1.2   ------------------------------------------------------------------------------------------------------------------- No results for input(s): DDIMER in the last 72 hours. -------------------------------------------------------------------------------------------------------------------  Cardiac Enzymes No results for input(s): CKMB, TROPONINI, MYOGLOBIN in the last 168 hours.  Invalid input(s): CK ------------------------------------------------------------------------------------------------------------------ No results found for: BNP   ---------------------------------------------------------------------------------------------------------------  Urinalysis    Component Value Date/Time   COLORURINE YELLOW 11/26/2020 1828   APPEARANCEUR HAZY (A) 11/26/2020 1828   LABSPEC 1.028 11/26/2020 1828   PHURINE 5.0 11/26/2020 1828   GLUCOSEU NEGATIVE 11/26/2020 1828   HGBUR NEGATIVE 11/26/2020 1828   BILIRUBINUR NEGATIVE 11/26/2020 1828   BILIRUBINUR negative 04/18/2015 0924   KETONESUR 20 (A) 11/26/2020 1828   PROTEINUR 100 (A) 11/26/2020 1828   UROBILINOGEN negative 04/18/2015 0924   NITRITE NEGATIVE 11/26/2020 1828   LEUKOCYTESUR NEGATIVE 11/26/2020 1828    ----------------------------------------------------------------------------------------------------------------   Imaging Results:    CT Head Wo Contrast  Result Date: 11/26/2020 CLINICAL DATA:  Confusion EXAM: CT HEAD WITHOUT CONTRAST TECHNIQUE: Contiguous axial images were obtained from the base of the skull through the vertex without intravenous contrast. COMPARISON:  08/19/2018 FINDINGS: Brain: There is atrophy and chronic small vessel disease changes. No acute intracranial abnormality. Specifically, no hemorrhage, hydrocephalus, mass lesion, acute infarction, or significant  intracranial injury. Vascular: No hyperdense vessel or unexpected calcification. Skull: No acute calvarial abnormality. Sinuses/Orbits: Visualized paranasal sinuses and mastoids clear. Orbital soft tissues unremarkable. Other: None IMPRESSION: Atrophy, chronic microvascular disease. No acute intracranial abnormality. Electronically Signed   By: Rolm Baptise M.D.   On: 11/26/2020 20:13   DG Chest Port 1 View  Result Date: 11/26/2020 CLINICAL DATA:  Questionable sepsis, fever, altered mental status EXAM: PORTABLE CHEST 1 VIEW COMPARISON:  09/24/2017 FINDINGS: Cardiomegaly, vascular congestion. Perihilar opacities could reflect atelectasis, infiltrates or edema. Right Port-A-Cath remains in place, unchanged. No effusions or acute bony abnormality. IMPRESSION: Cardiomegaly with vascular congestion. Perihilar opacities could reflect edema, atelectasis or infiltrates. Electronically Signed   By: Rolm Baptise M.D.   On: 11/26/2020 19:54    My personal review of EKG: Rhythm sinus tachycardia, Rate  113 /min, QTc 445 , no Acute ST changes   Assessment & Plan:    Active Problems:   Essential hypertension, benign   Sinusitis   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HCC)   Port-A-Cath in place   Hypothyroidism (acquired)   Dyslipidemia   SIRS -Presents with fever 103.6, tachypneic, tachycardic and with altered mental status. -So far source of sepsis could not be identified, but with linical suspicions for meningitis given headache, worsening neck pain, and altered mental status, as well recent history of sinusitis/sore throat. -Admitted under sepsis pathway, started on IV vancomycin, cefepime, ampicillin and IV acyclovir. -Obtain MRI brain to evaluate for encephalitis/radiologic imaging of meningitis. -We will be admitted to [facility] so MRI can be performed, and will place consult for IR for LP. -follow on  blood cultures. -Patient is on immunotherapy for lung cancer, it can cause sepsis as  well.  Acute metabolic encephalopathy -CT head with no acute finding, will obtain MRI brain. -Please see above discussion, no focal deficits. -Significantly agitated and restless, will keep on as needed Haldol. -He will need  Ativan prior to MRI, will place order.   Lung cancer -Followed by Dr. Earlie Server, on  immunotherapy as an outpatient.  Hypertension -Given he is n.p.o., will hold Coreg for now, will keep on as needed hydralazine.  Hypothyroidism -He is n.p.o. currently, if patient did not improve, will start on IV Synthroid within 72 hours   DVT Prophylaxis  SCDs (order pharmacologic DVT prophylaxis as likely he will need an LP)  AM Labs Ordered, also please review Full Orders  Family Communication: Admission, patients condition and plan of care including tests being ordered have been discussed with wife at bedside who indicate understanding and agree with the plan and Code Status.  Code Status Full  Likely DC to  home  Condition GUARDED    Consults called: none, but has IV antibiotic regimen with ID on-call  Admission status: inpatient    Time spent in minutes : 60 minutes   Phillips Climes M.D on 11/26/2020 at 9:39 PM   [facility] - Office  [phone]

## 2020-11-26 NOTE — ED Notes (Signed)
Pt continues to pull at lines and monitor chords.

## 2020-11-26 NOTE — ED Provider Notes (Signed)
[facility] EMERGENCY DEPARTMENT Provider Note   CSN: [id number] Arrival date & time: 11/26/20  1818     History Chief Complaint  Patient presents with  . Weakness    Travis Padilla is a 75 y.o. male.  Patient complained yesterday of a cough and a sore throat.  He was seen by primary care who started him on some antibiotics.  According to his wife he has not been talking since 6:30 PM.  Patient was picked up by the paramedics and he was febrile and confused  The history is provided by medical records and the EMS personnel. No language interpreter was used.  Altered Mental Status Presenting symptoms: behavior changes   Severity:  Severe Most recent episode:  Today Episode history:  Continuous Timing:  Constant Progression:  Waxing and waning Chronicity:  New Context: not alcohol use   Associated symptoms: no abdominal pain        Past Medical History:  Diagnosis Date  . Abnormal nuclear stress test    December, 2013  . Anemia   . Axillary adenopathy 04/24/2018  . CAD (coronary artery disease)    Mild nonobstructive plaque in cath 2013  . Chest pain    December, 2013  . Cough with hemoptysis 04/22/2018  . Dizziness   . Dyslipidemia 07/14/2019  . Encounter for antineoplastic immunotherapy 05/13/2018  . Gout   . Hemorrhoid   . Hyperlipidemia   . Hypertension   . Hypothyroidism (acquired) 08/11/2018  . 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) ([place]) dx'd 04/17/18   to LN, infrahilar mass, lung nodule and lt axilla  . Obesity, unspecified 03/28/2009   Qualifier: Diagnosis of  By: [name], MD, [name]    . Palpitations 03/28/2009   Qualifier: Diagnosis of  By: [name], MD, [name]    . Right lower lobe lung mass 04/22/2018  . Skin cancer   . SNORING 03/28/2009   Qualifier: Diagnosis of  By: [name], MD, [name]    . Spinal stenosis   . 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 Itching 11/17/2019  . Dyslipidemia 07/14/2019  . Shoulder pain 03/31/2019  . Port-A-Cath in place 08/11/2018  . Hypothyroidism (acquired) 08/11/2018  . 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 Encounter for antineoplastic chemotherapy 05/13/2018  . Encounter for antineoplastic immunotherapy 05/13/2018  . Goals of care, counseling/discussion 05/13/2018  . 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 Axillary adenopathy 04/24/2018  . Right lower lobe lung mass 04/22/2018  . Cough with hemoptysis 04/22/2018  . Wheezing 04/02/2018  . Long-term use of high-risk medication 04/02/2018  . Dizziness 06/26/2017  . Fatigue 04/30/2016  . Insomnia 04/30/2016  . Sinusitis 01/31/2016  . Hyperlipidemia 01/31/2016  . Ear pain 05/09/2015  . Nephrolithiasis 04/18/2015  . Anemia   . Abnormal nuclear stress test   . OBESITY, UNSPECIFIED 03/28/2009  . ESSENTIAL HYPERTENSION, BENIGN 03/28/2009  . PALPITATIONS 03/28/2009  . SNORING 03/28/2009    Past Surgical History:  Procedure Laterality Date  . 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 BELPHAROPTOSIS REPAIR Bilateral   . CATARACT EXTRACTION W/ INTRAOCULAR LENS  IMPLANT, BILATERAL    . COLONOSCOPY N/A 11/03/2014   Procedure: COLONOSCOPY;  Surgeon: Rogene Houston, MD;  Location: [facility] SUITE;  Service: Endoscopy;  Laterality: N/A;  1225  . IR CV LINE INJECTION  08/24/2020  . IR IMAGING GUIDED PORT INSERTION  07/11/2018  . KNEE CARTILAGE SURGERY Left    Left knee  . SKIN CANCER EXCISION  12/2014, 04/25/15  . VIDEO BRONCHOSCOPY Bilateral 05/09/2018   Procedure: VIDEO BRONCHOSCOPY WITH FLUORO;  Surgeon: Juanito Doom, MD;  Location: [facility] ENDOSCOPY;  Service: Cardiopulmonary;  Laterality: Bilateral;       Family History  Problem Relation Age of Onset  . Heart attack Father   .  Heart failure Mother   . Parkinson's disease Mother   . Healthy Sister   . Skin cancer Sister   . Neuropathy Brother   . COPD Brother   . Epilepsy Brother   . Healthy Sister   . Healthy Sister   . Colon cancer Neg Hx     Social History   Tobacco Use  . Smoking status: Former Smoker    Packs/day: 0.20    Years: 2.00    Pack years: 0.40    Types: Cigarettes    Start date: 10/01/1968  . Smokeless tobacco: Never Used  Vaping Use  . Vaping Use: Never used  Substance Use Topics  . Alcohol use: No    Comment: hx of   . Drug use: No    Home Medications Prior to Admission medications   Medication Sig Start Date End Date Taking? Authorizing Provider  acetaminophen (TYLENOL) 500 MG tablet Take 1,000 mg by mouth every 6 (six) hours as needed.   Yes [provider]  albuterol (PROVENTIL) (2.5 MG/3ML) 0.083% nebulizer solution Inhale 3 mLs (2.5 mg total) into the lungs every 6 (six) hours as needed for wheezing or shortness of breath. 11/03/20 11/03/21 Yes Icard, Octavio Graves, DO  Alirocumab (PRALUENT) 150 MG/ML SOAJ Inject 150 mg into the skin every 14 (fourteen) days. 04/11/20  Yes [name], MD  ALPRAZolam Duanne Moron) 0.25 MG tablet Take 1 tablet (0.25 mg total) by mouth at bedtime as needed for anxiety. 10/19/20  Yes Curt Bears, MD  amoxicillin-clavulanate (AUGMENTIN) 875-125 MG tablet Take 1 tablet by mouth 2 (two) times daily for 7 days. 11/25/20 12/02/20 Yes Hendricks Limes F, FNP  Artificial Tear Solution (SOOTHE XP OP) Place 1 drop into both eyes 2 (two) times daily.   Yes [provider]  carvedilol (COREG) 12.5 MG tablet Take 1 tablet (12.5 mg total) by mouth in the morning, at noon, and at bedtime. 01/29/20  Yes [name], MD  ferrous sulfate 324 MG TBEC Take 324 mg by mouth daily with breakfast.   Yes [provider]  fluticasone (FLONASE) 50 MCG/ACT nasal spray Place 1 spray into both nostrils daily. 07/01/20  Yes Lauraine Rinne, NP   Fluticasone-Umeclidin-Vilant (TRELEGY ELLIPTA) 100-62.5-25 MCG/INH AEPB Inhale 1 puff into the lungs daily. 09/20/20  Yes Icard, Octavio Graves, DO  levothyroxine (SYNTHROID) 175 MCG tablet TAKE 1 TABLET BY MOUTH EVERY DAY BEFORE BREAKFAST Patient taking differently: Take 175 mcg by mouth daily before breakfast. 11/26/20  Yes Curt Bears, MD  lidocaine-prilocaine (EMLA) cream Apply 1 application topically as needed. 04/21/19  Yes Heilingoetter, Cassandra L, PA-C  methocarbamol (ROBAXIN) 500 MG tablet Take 500 mg by mouth daily. 08/11/19  Yes [provider]  oxyCODONE-acetaminophen (PERCOCET/ROXICET) 5-325 MG tablet Take 1 tablet by mouth every 8 (eight) hours as needed for severe pain. 08/16/20  Yes Heilingoetter, Cassandra L, PA-C  senna-docusate (SENNA S) 8.6-50 MG tablet 1 to  2 twice daily for constipation 07/07/18  Yes Tanner, Lyndon Code., PA-C  azelastine (ASTELIN) 0.1 % nasal spray Place 1 spray into both nostrils 2 (two) times daily. Patient not taking: No sig reported 07/27/20   [name], DO  Cyanocobalamin (VITAMIN B 12) 250 MCG LOZG Take by mouth. Patient not taking: No sig reported    [provider]  diphenhydramine-acetaminophen (TYLENOL PM) 25-500 MG TABS tablet Take 1-2 tablets by mouth at bedtime as needed (pain). Patient not taking: Reported on 11/26/2020    [provider]  Fluticasone-Umeclidin-Vilant (TRELEGY ELLIPTA) 100-62.5-25 MCG/INH AEPB Inhale 1 puff into the lungs daily. Patient not taking: No sig reported 09/20/20   Icard, Bradley L, DO  guaiFENesin-dextromethorphan (ROBITUSSIN DM) 100-10 MG/5ML syrup Take 5 mLs by mouth every 4 (four) hours as needed for cough. Patient not taking: No sig reported    [provider]  Multiple Vitamins-Minerals (PRESERVISION AREDS 2 PO) Take by mouth. Patient not taking: No sig reported    [provider]    Allergies    Pravastatin and Iodine  Review of Systems   Review of Systems   Unable to perform ROS: Mental status change  Gastrointestinal: Negative for abdominal pain.    Physical Exam Updated Vital Signs BP (!) 143/90   Pulse 98   Temp (!) 101.48 F (38.6 C)   Resp (!) 24   Ht 6\' 2"  (1.88 m)   Wt 110 kg   SpO2 95%   BMI 31.14 kg/m   Physical Exam Vitals and nursing note reviewed.  Constitutional:      Appearance: He is well-developed.  HENT:     Head: Normocephalic.     Nose: Nose normal.  Eyes:     General: No scleral icterus.    Conjunctiva/sclera: Conjunctivae normal.  Neck:     Thyroid: No thyromegaly.  Cardiovascular:     Rate and Rhythm: Regular rhythm. Tachycardia present.     Heart sounds: No murmur heard. No friction rub. No gallop.   Pulmonary:     Breath sounds: No stridor. No wheezing or rales.  Chest:     Chest wall: No tenderness.  Abdominal:     General: There is no distension.     Tenderness: There is no abdominal tenderness. There is no rebound.  Musculoskeletal:        General: Normal range of motion.     Cervical back: Neck supple. No rigidity.  Lymphadenopathy:     Cervical: No cervical adenopathy.  Skin:    Findings: No erythema or rash.  Neurological:     Mental Status: He is alert.     Motor: No abnormal muscle tone.     Coordination: Coordination normal.     Comments: Patient alert but not oriented at all.  Psychiatric:        Behavior: Behavior normal.     ED Results / Procedures / Treatments   Labs (all labs ordered are listed, but only abnormal results are displayed) Labs Reviewed  COMPREHENSIVE METABOLIC PANEL - Abnormal; Notable for the following components:      Result Value   Glucose, Bld 136 (*)    Calcium 8.7 (*)    All other components within normal limits  CBC WITH DIFFERENTIAL/PLATELET - Abnormal; Notable for the following components:   RBC 4.05 (*)    HCT 38.9 (*)    Platelets 105 (*)    All other components within normal limits  URINALYSIS, ROUTINE W REFLEX MICROSCOPIC -  Abnormal;  Notable for the following components:   APPearance HAZY (*)    Ketones, ur 20 (*)    Protein, ur 100 (*)    Bacteria, UA RARE (*)    All other components within normal limits  CBG MONITORING, ED - Abnormal; Notable for the following components:   Glucose-Capillary 124 (*)    All other components within normal limits  RESP PANEL BY RT-PCR (FLU A&B, COVID) ARPGX2  CULTURE, BLOOD (ROUTINE X 2)  CULTURE, BLOOD (ROUTINE X 2)  URINE CULTURE  LACTIC ACID, PLASMA  PROTIME-INR  APTT  LACTIC ACID, PLASMA  PROCALCITONIN  PROCALCITONIN    EKG None  Radiology CT Head Wo Contrast  Result Date: 11/26/2020 CLINICAL DATA:  Confusion EXAM: CT HEAD WITHOUT CONTRAST TECHNIQUE: Contiguous axial images were obtained from the base of the skull through the vertex without intravenous contrast. COMPARISON:  08/19/2018 FINDINGS: Brain: There is atrophy and chronic small vessel disease changes. No acute intracranial abnormality. Specifically, no hemorrhage, hydrocephalus, mass lesion, acute infarction, or significant intracranial injury. Vascular: No hyperdense vessel or unexpected calcification. Skull: No acute calvarial abnormality. Sinuses/Orbits: Visualized paranasal sinuses and mastoids clear. Orbital soft tissues unremarkable. Other: None IMPRESSION: Atrophy, chronic microvascular disease. No acute intracranial abnormality. Electronically Signed   By: Rolm Baptise M.D.   On: 11/26/2020 20:13   DG Chest Port 1 View  Result Date: 11/26/2020 CLINICAL DATA:  Questionable sepsis, fever, altered mental status EXAM: PORTABLE CHEST 1 VIEW COMPARISON:  09/24/2017 FINDINGS: Cardiomegaly, vascular congestion. Perihilar opacities could reflect atelectasis, infiltrates or edema. Right Port-A-Cath remains in place, unchanged. No effusions or acute bony abnormality. IMPRESSION: Cardiomegaly with vascular congestion. Perihilar opacities could reflect edema, atelectasis or infiltrates. Electronically Signed   By: Rolm Baptise M.D.   On: 11/26/2020 19:54    Procedures Procedures   Medications Ordered in ED Medications  lactated ringers infusion ( Intravenous New Bag/Given 11/26/20 2017)  vancomycin (VANCOCIN) IVPB 1000 mg/200 mL premix (1,000 mg Intravenous New Bag/Given 11/26/20 2005)  ceFEPIme (MAXIPIME) 2 g in sodium chloride 0.9 % 100 mL IVPB (has no administration in time range)  vancomycin (VANCOREADY) IVPB 750 mg/150 mL (has no administration in time range)  LORazepam (ATIVAN) injection 1 mg (has no administration in time range)  acetaminophen (TYLENOL) suppository 650 mg (650 mg Rectal Given 11/26/20 1850)  lactated ringers bolus 1,000 mL (0 mLs Intravenous Stopped 11/26/20 1925)    And  lactated ringers bolus 1,000 mL (0 mLs Intravenous Stopped 11/26/20 2017)    And  lactated ringers bolus 1,000 mL (0 mLs Intravenous Stopped 11/26/20 2051)    And  lactated ringers bolus 500 mL (500 mLs Intravenous New Bag/Given 11/26/20 2016)  ceFEPIme (MAXIPIME) 2 g in sodium chloride 0.9 % 100 mL IVPB (0 g Intravenous Stopped 11/26/20 1925)  vancomycin (VANCOCIN) IVPB 1000 mg/200 mL premix (0 mg Intravenous Stopped 11/26/20 2002)  LORazepam (ATIVAN) injection 1 mg (1 mg Intravenous Given 11/26/20 1921)  LORazepam (ATIVAN) injection 1 mg (1 mg Intravenous Given 11/26/20 1939)    ED Course  I have reviewed the triage vital signs and the nursing notes.  Pertinent labs & imaging results that were available during my care of the patient were reviewed by me and considered in my medical decision making (see chart for details). CRITICAL CARE Performed by: Milton Ferguson Total critical care time:29minutes Critical care time was exclusive of separately billable procedures and treating other patients. Critical care was necessary to treat or prevent imminent or life-threatening deterioration.  Critical care was time spent personally by me on the following activities: development of treatment plan with patient and/or surrogate  as well as nursing, discussions with consultants, evaluation of patient's response to treatment, examination of patient, obtaining history from patient or surrogate, ordering and performing treatments and interventions, ordering and review of laboratory studies, ordering and review of radiographic studies, pulse oximetry and re-evaluation of patient's condition.    MDM Rules/Calculators/A&P                         Patient with metastatic stage IV lung cancer and febrile illness.  He has been treated as if he is septic and he will be admitted by medicine Final Clinical Impression(s) / ED Diagnoses Final diagnoses:  None    Rx / DC Orders ED Discharge Orders    None       Milton Ferguson, MD 11/26/20 2102

## 2020-11-26 NOTE — ED Notes (Signed)
Pt back from CT with nurse, pt continues to be restless, but decreased in agitation noted with ativan

## 2020-11-26 NOTE — ED Notes (Signed)
Dr elgergaway notified of pt temp

## 2020-11-26 NOTE — ED Notes (Signed)
Pt confused and disoriented, pulling at lines and monitor chords, new order received for ativan.

## 2020-11-26 NOTE — ED Notes (Signed)
Pt will be transported to CT with nurse

## 2020-11-26 NOTE — Progress Notes (Signed)
Pharmacy Antibiotic Note  Travis Padilla is a 75 y.o. male admitted on 11/26/2020 with sepsis - likely meningitis.  Pharmacy consulted earlier for vancomycin and cefepime dosing. Now consulted to add ampicillin and acyclovir for meningitis.  Plan: Continue vancomycin 750mg   IV every 12 hours.  Goal trough 15-20 mcg/mL. Continue cefepime 2gm IV every 8 hours Ampicillin 2gm IV every 4 hours Acyclovir 935 mg IV every 8 hours Will f/u renal function, micro data, and pt's clinical condition   Height: 6\' 2"  (188 cm) Weight: 110 kg (242 lb 8.1 oz) IBW/kg (Calculated) : 82.2  Temp (24hrs), Avg:102.3 F (39.1 C), Min:101.48 F (38.6 C), Max:103.6 F (39.8 C)  Recent Labs  Lab 11/26/20 1845 11/26/20 1849 11/26/20 2020  WBC 8.4  --   --   CREATININE 1.19  --   --   LATICACIDVEN  --  1.6 1.5    Estimated Creatinine Clearance: 73 mL/min (by C-G formula based on SCr of 1.19 mg/dL).    Allergies  Allergen Reactions  . Pravastatin     Numbness in feet   . Iodine Hives and Rash    CT scan contrast caused rash and itching     Antimicrobials this admission: 3/19 cefepime >>  3/19 vancomycin >>  3/19 ampicillin >> 3/19 acyclovir >>  Microbiology results: 3/11 BCx: pending  3/11 UCx: sent    Thank you for allowing pharmacy to be a part of this patient's care.  Sherlon Handing, PharmD, BCPS Please see amion for complete clinical pharmacist phone list 11/26/2020 9:50 PM

## 2020-11-26 NOTE — ED Triage Notes (Signed)
Pt brought to ED via [facility], Code sepsis called by EMS. Pt temp 104 has been on abx recently. Pt started with not speaking and left sided gaze yesterday at 1830, also withdrawing to touch. CBG 158. 18g L AC. 548ml NS given per EMS

## 2020-11-27 ENCOUNTER — Inpatient Hospital Stay (HOSPITAL_COMMUNITY): Payer: Medicare Other

## 2020-11-27 ENCOUNTER — Encounter (HOSPITAL_COMMUNITY): Payer: Self-pay | Admitting: Internal Medicine

## 2020-11-27 DIAGNOSIS — E785 Hyperlipidemia, unspecified: Secondary | ICD-10-CM | POA: Diagnosis not present

## 2020-11-27 DIAGNOSIS — R6520 Severe sepsis without septic shock: Secondary | ICD-10-CM

## 2020-11-27 DIAGNOSIS — G0490 Encephalitis and encephalomyelitis, unspecified: Principal | ICD-10-CM

## 2020-11-27 DIAGNOSIS — D696 Thrombocytopenia, unspecified: Secondary | ICD-10-CM

## 2020-11-27 DIAGNOSIS — R6510 Systemic inflammatory response syndrome (SIRS) of non-infectious origin without acute organ dysfunction: Secondary | ICD-10-CM

## 2020-11-27 DIAGNOSIS — Z95828 Presence of other vascular implants and grafts: Secondary | ICD-10-CM

## 2020-11-27 DIAGNOSIS — W57XXXA Bitten or stung by nonvenomous insect and other nonvenomous arthropods, initial encounter: Secondary | ICD-10-CM

## 2020-11-27 DIAGNOSIS — A419 Sepsis, unspecified organism: Secondary | ICD-10-CM | POA: Diagnosis not present

## 2020-11-27 DIAGNOSIS — W57XXXD Bitten or stung by nonvenomous insect and other nonvenomous arthropods, subsequent encounter: Secondary | ICD-10-CM

## 2020-11-27 DIAGNOSIS — I10 Essential (primary) hypertension: Secondary | ICD-10-CM | POA: Diagnosis not present

## 2020-11-27 DIAGNOSIS — E039 Hypothyroidism, unspecified: Secondary | ICD-10-CM | POA: Diagnosis not present

## 2020-11-27 DIAGNOSIS — S40862A Insect bite (nonvenomous) of left upper arm, initial encounter: Secondary | ICD-10-CM

## 2020-11-27 DIAGNOSIS — R4182 Altered mental status, unspecified: Secondary | ICD-10-CM

## 2020-11-27 DIAGNOSIS — R509 Fever, unspecified: Secondary | ICD-10-CM

## 2020-11-27 LAB — CBC
HCT: 30.9 % — ABNORMAL LOW (ref 39.0–52.0)
Hemoglobin: 11.2 g/dL — ABNORMAL LOW (ref 13.0–17.0)
MCH: 33.5 pg (ref 26.0–34.0)
MCHC: 36.2 g/dL — ABNORMAL HIGH (ref 30.0–36.0)
MCV: 92.5 fL (ref 80.0–100.0)
Platelets: DECREASED 10*3/uL (ref 150–400)
RBC: 3.34 MIL/uL — ABNORMAL LOW (ref 4.22–5.81)
RDW: 11.7 % (ref 11.5–15.5)
WBC: 7.2 10*3/uL (ref 4.0–10.5)
nRBC: 0 % (ref 0.0–0.2)

## 2020-11-27 LAB — BASIC METABOLIC PANEL
Anion gap: 8 (ref 5–15)
BUN: 18 mg/dL (ref 8–23)
CO2: 22 mmol/L (ref 22–32)
Calcium: 8.4 mg/dL — ABNORMAL LOW (ref 8.9–10.3)
Chloride: 105 mmol/L (ref 98–111)
Creatinine, Ser: 1.18 mg/dL (ref 0.61–1.24)
GFR, Estimated: >60 mL/min (ref >60)
Glucose, Bld: 195 mg/dL — ABNORMAL HIGH (ref 70–99)
Potassium: 3.7 mmol/L (ref 3.5–5.1)
Sodium: 135 mmol/L (ref 135–145)

## 2020-11-27 LAB — T4, FREE: Free T4: 1.1 ng/dL (ref 0.61–1.12)

## 2020-11-27 LAB — AMMONIA: Ammonia: <9 umol/L — ABNORMAL LOW (ref 9–35)

## 2020-11-27 LAB — COMPREHENSIVE METABOLIC PANEL
ALT: 17 U/L (ref 0–44)
AST: 33 U/L (ref 15–41)
Albumin: 3.2 g/dL — ABNORMAL LOW (ref 3.5–5.0)
Alkaline Phosphatase: 32 U/L — ABNORMAL LOW (ref 38–126)
Anion gap: 5 (ref 5–15)
BUN: 17 mg/dL (ref 8–23)
CO2: 22 mmol/L (ref 22–32)
Calcium: 8.4 mg/dL — ABNORMAL LOW (ref 8.9–10.3)
Chloride: 108 mmol/L (ref 98–111)
Creatinine, Ser: 1.15 mg/dL (ref 0.61–1.24)
GFR, Estimated: >60 mL/min (ref >60)
Glucose, Bld: 192 mg/dL — ABNORMAL HIGH (ref 70–99)
Potassium: 4.1 mmol/L (ref 3.5–5.1)
Sodium: 135 mmol/L (ref 135–145)
Total Bilirubin: 1.2 mg/dL (ref 0.3–1.2)
Total Protein: 6.1 g/dL — ABNORMAL LOW (ref 6.5–8.1)

## 2020-11-27 LAB — PROTIME-INR
INR: 1.2 (ref 0.8–1.2)
Prothrombin Time: 14.9 seconds (ref 11.4–15.2)

## 2020-11-27 LAB — GLUCOSE, CAPILLARY: Glucose-Capillary: 150 mg/dL — ABNORMAL HIGH (ref 70–99)

## 2020-11-27 LAB — MRSA PCR SCREENING: MRSA by PCR: NEGATIVE

## 2020-11-27 LAB — TECHNOLOGIST SMEAR REVIEW

## 2020-11-27 LAB — TSH: TSH: 0.138 u[IU]/mL — ABNORMAL LOW (ref 0.350–4.500)

## 2020-11-27 LAB — VITAMIN B12: Vitamin B-12: 503 pg/mL (ref 180–914)

## 2020-11-27 LAB — PROCALCITONIN: Procalcitonin: 0.16 ng/mL

## 2020-11-27 MED ORDER — CHLORHEXIDINE GLUCONATE CLOTH 2 % EX PADS
6.0000 | MEDICATED_PAD | Freq: Every day | CUTANEOUS | Status: DC
  Administered 2020-11-28 – 2020-12-01 (×4): 6 via TOPICAL

## 2020-11-27 MED ORDER — DOXYCYCLINE HYCLATE 100 MG IV SOLR
200.0000 mg | Freq: Once | INTRAVENOUS | Status: AC
  Administered 2020-11-27: 200 mg via INTRAVENOUS
  Filled 2020-11-27: qty 200

## 2020-11-27 MED ORDER — LEVOTHYROXINE SODIUM 100 MCG/5ML IV SOLN
100.0000 ug | Freq: Every day | INTRAVENOUS | Status: DC
  Administered 2020-11-29: 100 ug via INTRAVENOUS
  Filled 2020-11-27 (×2): qty 5

## 2020-11-27 MED ORDER — SODIUM CHLORIDE 0.9% FLUSH: 10.0000 mL | INTRAVENOUS | Status: DC | PRN

## 2020-11-27 MED ORDER — HALOPERIDOL LACTATE 5 MG/ML IJ SOLN
2.0000 mg | Freq: Once | INTRAMUSCULAR | Status: AC
  Administered 2020-11-27: 2 mg via INTRAMUSCULAR
  Filled 2020-11-27: qty 1

## 2020-11-27 MED ORDER — FUROSEMIDE 10 MG/ML IJ SOLN
40.0000 mg | Freq: Once | INTRAMUSCULAR | Status: AC
  Administered 2020-11-27: 40 mg via INTRAVENOUS
  Filled 2020-11-27: qty 4

## 2020-11-27 MED ORDER — ORAL CARE MOUTH RINSE
15.0000 mL | Freq: Two times a day (BID) | OROMUCOSAL | Status: DC
  Administered 2020-11-27 – 2020-12-02 (×11): 15 mL via OROMUCOSAL

## 2020-11-27 MED ORDER — IPRATROPIUM-ALBUTEROL 0.5-2.5 (3) MG/3ML IN SOLN
3.0000 mL | Freq: Three times a day (TID) | RESPIRATORY_TRACT | Status: DC
  Administered 2020-11-28 (×2): 3 mL via RESPIRATORY_TRACT
  Filled 2020-11-27 (×4): qty 3

## 2020-11-27 MED ORDER — SENNOSIDES-DOCUSATE SODIUM 8.6-50 MG PO TABS
2.0000 | ORAL_TABLET | Freq: Every day | ORAL | Status: DC
  Administered 2020-11-28 – 2020-12-02 (×3): 2 via ORAL
  Filled 2020-11-27 (×5): qty 2

## 2020-11-27 MED ORDER — FLUTICASONE-UMECLIDIN-VILANT 100-62.5-25 MCG/INH IN AEPB: 1.0000 | INHALATION_SPRAY | Freq: Every day | RESPIRATORY_TRACT | Status: DC

## 2020-11-27 MED ORDER — ALPRAZOLAM 0.25 MG PO TABS: 0.2500 mg | ORAL_TABLET | Freq: Every evening | ORAL | Status: DC | PRN

## 2020-11-27 MED ORDER — HALOPERIDOL LACTATE 5 MG/ML IJ SOLN
2.0000 mg | Freq: Four times a day (QID) | INTRAMUSCULAR | Status: DC | PRN
  Administered 2020-11-27: 2 mg via INTRAVENOUS

## 2020-11-27 MED ORDER — DEXAMETHASONE SODIUM PHOSPHATE 10 MG/ML IJ SOLN
10.0000 mg | Freq: Once | INTRAMUSCULAR | Status: AC
  Administered 2020-11-27: 10 mg via INTRAVENOUS
  Filled 2020-11-27: qty 1

## 2020-11-27 MED ORDER — LACTATED RINGERS IV SOLN: INTRAVENOUS | Status: DC

## 2020-11-27 MED ORDER — UMECLIDINIUM BROMIDE 62.5 MCG/INH IN AEPB
1.0000 | INHALATION_SPRAY | Freq: Every day | RESPIRATORY_TRACT | Status: DC
  Administered 2020-11-27 – 2020-12-02 (×6): 1 via RESPIRATORY_TRACT
  Filled 2020-11-27: qty 7

## 2020-11-27 MED ORDER — SODIUM CHLORIDE 0.9 % IV SOLN: 2.0000 g | Freq: Once | INTRAVENOUS | Status: DC

## 2020-11-27 MED ORDER — SODIUM CHLORIDE 0.9 % IV SOLN
100.0000 mg | Freq: Two times a day (BID) | INTRAVENOUS | Status: DC
  Administered 2020-11-28: 100 mg via INTRAVENOUS
  Filled 2020-11-27 (×2): qty 100

## 2020-11-27 MED ORDER — FLUTICASONE FUROATE-VILANTEROL 200-25 MCG/INH IN AEPB
1.0000 | INHALATION_SPRAY | Freq: Every day | RESPIRATORY_TRACT | Status: DC
  Administered 2020-11-27 – 2020-12-02 (×6): 1 via RESPIRATORY_TRACT
  Filled 2020-11-27: qty 28

## 2020-11-27 MED ORDER — SODIUM CHLORIDE 0.9% FLUSH
10.0000 mL | Freq: Two times a day (BID) | INTRAVENOUS | Status: DC
  Administered 2020-11-27 – 2020-12-02 (×9): 10 mL

## 2020-11-27 MED ORDER — DEXTROSE 5 % IV SOLN: 10.0000 mg/kg | Freq: Once | INTRAVENOUS | Status: DC

## 2020-11-27 MED ORDER — ALBUTEROL SULFATE (2.5 MG/3ML) 0.083% IN NEBU
2.5000 mg | INHALATION_SOLUTION | Freq: Four times a day (QID) | RESPIRATORY_TRACT | Status: DC | PRN
  Administered 2020-12-02: 2.5 mg via RESPIRATORY_TRACT

## 2020-11-27 MED ORDER — HALOPERIDOL LACTATE 5 MG/ML IJ SOLN
3.0000 mg | Freq: Once | INTRAMUSCULAR | Status: AC
  Administered 2020-11-27: 3 mg via INTRAVENOUS
  Filled 2020-11-27: qty 1

## 2020-11-27 MED ORDER — SENNOSIDES-DOCUSATE SODIUM 8.6-50 MG PO TABS: 1.0000 | ORAL_TABLET | Freq: Every evening | ORAL | Status: DC | PRN

## 2020-11-27 MED ORDER — DM-GUAIFENESIN ER 30-600 MG PO TB12: 1.0000 | ORAL_TABLET | Freq: Two times a day (BID) | ORAL | Status: DC | PRN

## 2020-11-27 MED ORDER — HALOPERIDOL 1 MG PO TABS
2.0000 mg | ORAL_TABLET | Freq: Four times a day (QID) | ORAL | Status: DC | PRN
  Filled 2020-11-27: qty 2

## 2020-11-27 NOTE — Consult Note (Addendum)
[facility]    Date of Admission:  11/26/2020     Reason for Consult: sepsis, meningoencephalitis, tick bite, immunosuppressed    Referring Provider: Reesa Chew   Lines:  Peripheral portacath  Abx: 3/20-c doxycycline 3/19-c acyclovir 3/19-c ampicillin 3/19-c cefepime 3/19-c vanc        Assessment: Sepsis AMS/meningoencephalitis Tick bite Lung cancer on pembrolizumab  75 yo male non-small cell lung cancer (dx'ed 04/2018; stage IV; squamous cell; left axillary mass/node biopsy; prior tx 4 cycles platnum/taxel/keytruda with partial response, now on maintenance keytruda), chronic back bain, here with sepis/ams and thrombocytopenia as well as presence of a tick on his body   His pembrolizumab shouldn't be an issue in terms of predisposing him to any OI  At this time, bcx so far negative. cxr clear. His last ct pan body showed stable pulm disease of cancer and no mets. His head ct noncontrast here is also without acute pathology  We are considering meningoencephalitis process with/without tick associated disease/thrombocytopenia. For cns consider hsv/bacterial meningitis/systemic process of tick disease. Initial approach would be to get mri brain/LP and blood smear (will need to review with pathology -- tech review only thrombocytopenia), f/u final blood culture, and check for endemic tick associated serology (rmsf << ehrlichia) while covering for appropriate causes considered  Of note, lyme would be lower in ddx. Would consider primary process but no classic rash  Sepsis associated metabolic encephalopathy in ddx as mentioned. Port-a-cath present, await bcx. Samuel [place] sx so will get full respiratory viral pcr  pembrolizumab would be associated with autoimmune process but would usually be associated with organ of tx, diarrhea/dermatitis, which he had none of   Plan: 1. I discussed with Dr Reesa Chew to see if we could arrange for general anesthesia to get  LP/brain mri 2. For csf, would send hsv pcr Bacterial culture Cell count/diff Glucose/protein 3. ID team can consider reviewing blood smear with pathology 4. F/u ehrlichia/rmsf serology 5. sendout order for ehrlichia pcr 6. Will get full respiratory viral pcr (sample obtained at admission can be used) 7. 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)   SIRS (systemic inflammatory response syndrome) (HCC)   Scheduled Meds:  Chlorhexidine Gluconate Cloth  6 each Topical Daily   fluticasone furoate-vilanterol  1 puff Inhalation Daily   And   umeclidinium bromide  1 puff Inhalation Daily   [START ON 11/29/2020] levothyroxine  100 mcg Intravenous Daily   mouth rinse  15 mL Mouth Rinse BID   senna-docusate  2 tablet Oral QHS   sodium chloride flush  10-40 mL Intracatheter Q12H   Continuous Infusions:  acyclovir 935 mg (11/27/20 0708)   ampicillin (OMNIPEN) IV 2 g (11/27/20 1002)   ceFEPime (MAXIPIME) IV Stopped (11/27/20 0244)   lactated ringers 75 mL/hr at 11/27/20 2542   vancomycin 750 mg (11/27/20 0934)   PRN Meds:.acetaminophen, acetaminophen, albuterol, ALPRAZolam, dextromethorphan-guaiFENesin, haloperidol lactate, senna-docusate, sodium chloride flush  HPI: Travis Padilla is a 75 y.o. male 75 yo male non-small cell lung cancer (dx'ed 04/2018; stage IV; squamous cell; left axillary mass/node biopsy; prior tx 4 cycles platnum/taxel/keytruda  with partial response, now on maintenance keytruda), chronic back bain, here  sepis/ams and thrombocytopenia as well as presence of a tick on his body   #lung cancer 10/2020 pan body ct without progression disease; disease  in the thoracic cavity Per oncology note, patient complained of chronic back pain; imaging ordered but doesn't appear to be done in [place] [place]  #presentation of illness Patient altered; hx from chart/wife He developed acute onset sorethroat, cough, nasal congestion 2 days prior to admission. pcp virtual visit --> received augmentin The day of admission was sleeping all day, not arousable. Ems called Other complains include headache with photophobia Wife also reports he vomiting a couple times after initially taking augmentin on empty stomach. She said also her husband and she have been "passing back and forth some virus -- nasal congestion, sorethroat, dry cough" the past month prior to this admission Patient has been working out in the yard the week before this admission, weeding, arranging hays...  #hospital course High fever 103s on presentation No leukocytosis; cbc with new thrombocytosis. Cr baseline. lft without abnormality cxr clear Head ct no acute bleed/abnormality  Patient transferred to [facility] for further w/u including LP He was started on empiric vanc/cefepime/acyclovir  Today I advised team to add doxy as a tick was found on his left axilla when he was transferred to [facility]  Per nursing team no rash, diarrhea/vomiting here. Patient more active but not following commands  Review of Systems: ROS Unable to get as patient altered  Past Medical History:  Diagnosis Date   Abnormal nuclear stress test    December, 2013   Anemia    Axillary adenopathy 04/24/2018   CAD (coronary artery disease)    Mild nonobstructive plaque in cath 2013   Chest pain    December, 2013   Cough with hemoptysis 04/22/2018   Dizziness    Dyslipidemia 07/14/2019   Encounter for antineoplastic immunotherapy 05/13/2018   Gout    Hemorrhoid    Hyperlipidemia    Hypertension    Hypothyroidism (acquired) 08/11/2018   Metastasis to lung ([place]) 05/19/2018   Metastatic lung cancer  (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 other site) ([place]) dx'd 04/17/18   to LN, infrahilar mass, lung nodule and lt axilla   Obesity, unspecified 03/28/2009   Qualifier: Diagnosis of  By: [name], MD, [name]     Palpitations 03/28/2009   Qualifier: Diagnosis of  By: [name], MD, [name]     Right lower lobe lung mass 04/22/2018   Skin cancer    SNORING 03/28/2009   Qualifier: Diagnosis of  By: [name], MD, [name]     Spinal stenosis    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[place]) 05/13/2018    Social History   Tobacco Use   Smoking status: Former Smoker    Packs/day: 0.20    Years: 2.00    Pack years: 0.40    Types: Cigarettes    Start date: 10/01/1968   Smokeless tobacco: Never Used  Vaping Use   Vaping Use: Never used  Substance Use Topics   Alcohol use: No    Comment: hx of    Drug use: No    Family History  Problem Relation Age of Onset   Heart attack Father    Heart failure Mother    Parkinson's disease Mother    Healthy Sister    Skin cancer Sister    Neuropathy Brother    COPD Brother    Epilepsy Brother  Healthy Sister    Healthy Sister    Colon cancer Neg Hx    Allergies  Allergen Reactions   Pravastatin     Numbness in feet    Iodine Hives and Rash    CT scan contrast caused rash and itching     OBJECTIVE: Blood pressure (!) 125/94, pulse 78, temperature 100.3 F (37.9 C), temperature source Rectal, resp. rate 19, height 6\' 2"  (1.88 m), weight 111.5 kg, SpO2 96 %.  Physical Exam Appears acutely ill, moaning, not conversant, able to say "no" when asked if any body discomfort present Heent: normocephalic; per; conj clear; oropharynx clear Neck supple cv rrr no mrg Lungs clear; normal respiratory effort abd s/nt Ext no edema Skin no rash Neuro: moving all extremities without volition; no clonus/tremor Psych altered msk no peripheral joint swelling/erythema/warmth  Lab Results Lab Results  Component Value Date    WBC 8.4 11/26/2020   HGB 13.2 11/26/2020   HCT 38.9 (L) 11/26/2020   MCV 96.0 11/26/2020   PLT 105 (L) 11/26/2020    Lab Results  Component Value Date   CREATININE 1.15 11/27/2020   BUN 17 11/27/2020   NA 135 11/27/2020   K 4.1 11/27/2020   CL 108 11/27/2020   CO2 22 11/27/2020    Lab Results  Component Value Date   ALT 17 11/27/2020   AST 33 11/27/2020   ALKPHOS 32 (L) 11/27/2020   BILITOT 1.2 11/27/2020     Microbiology: Recent Results (from the past 240 hour(s))  Blood Culture (routine x 2)     Status: None (Preliminary result)   Collection Time: 11/26/20  6:45 PM   Specimen: BLOOD  Result Value Ref Range Status   Specimen Description BLOOD RIGHT ANTECUBITAL  Final   Special Requests   Final    BOTTLES DRAWN AEROBIC AND ANAEROBIC Blood Culture adequate volume   Culture   Final    NO GROWTH < 24 HOURS Performed at [facility], [address] [place], [place] [address]    Report Status PENDING  Incomplete  Resp Panel by RT-PCR (Flu A&B, Covid) Nasopharyngeal Swab     Status: None   Collection Time: 11/26/20  7:25 PM   Specimen: Nasopharyngeal Swab; Nasopharyngeal(NP) swabs in vial transport medium  Result Value Ref Range Status   SARS Coronavirus 2 by RT PCR NEGATIVE NEGATIVE Final    Comment: (NOTE) SARS-CoV-2 target nucleic acids are NOT DETECTED.  The SARS-CoV-2 RNA is generally detectable in upper respiratory specimens during the acute phase of infection. The lowest concentration of SARS-CoV-2 viral copies this assay can detect is 138 copies/mL. A negative result does not preclude SARS-Cov-2 infection and should not be used as the sole basis for treatment or other patient management decisions. A negative result may occur with  improper specimen collection/handling, submission of specimen other than nasopharyngeal swab, presence of viral mutation(s) within the areas targeted by this assay, and inadequate number of viral copies(<138 copies/mL). A negative  result must be combined with clinical observations, patient history, and epidemiological information. The expected result is Negative.  Fact Sheet for Patients:  EntrepreneurPulse.com.au  Fact Sheet for Healthcare Providers:  IncredibleEmployment.be  This test is no t yet approved or cleared by the [place] FDA and  has been authorized for detection and/or diagnosis of SARS-CoV-2 by FDA under an Emergency Use Authorization (EUA). This EUA will remain  in effect (meaning this test can be used) for the duration of the COVID-19 declaration under Section 564(b)(1) of  the Act, 21 U.S.C.section 360bbb-3(b)(1), unless the authorization is terminated  or revoked sooner.       Influenza A by PCR NEGATIVE NEGATIVE Final   Influenza B by PCR NEGATIVE NEGATIVE Final    Comment: (NOTE) The Xpert Xpress SARS-CoV-2/FLU/RSV plus assay is intended as an aid in the diagnosis of influenza from Nasopharyngeal swab specimens and should not be used as a sole basis for treatment. Nasal washings and aspirates are unacceptable for Xpert Xpress SARS-CoV-2/FLU/RSV testing.  Fact Sheet for Patients: EntrepreneurPulse.com.au  Fact Sheet for Healthcare Providers: IncredibleEmployment.be  This test is not yet approved or cleared by the [place] FDA and has been authorized for detection and/or diagnosis of SARS-CoV-2 by FDA under an Emergency Use Authorization (EUA). This EUA will remain in effect (meaning this test can be used) for the duration of the COVID-19 declaration under Section 564(b)(1) of the Act, 21 U.S.C. section 360bbb-3(b)(1), unless the authorization is terminated or revoked.  Performed at [facility], [address] [place], [place] [address]   Blood Culture (routine x 2)     Status: None (Preliminary result)   Collection Time: 11/26/20  7:32 PM   Specimen: BLOOD  Result Value Ref Range Status    Specimen Description BLOOD BLOOD LEFT HAND  Final   Special Requests   Final    BOTTLES DRAWN AEROBIC AND ANAEROBIC Blood Culture adequate volume   Culture   Final    NO GROWTH < 24 HOURS Performed at [facility], [address], [place], [place] [address]    Report Status PENDING  Incomplete  MRSA PCR Screening     Status: None   Collection Time: 11/27/20  4:12 AM   Specimen: Nasal Mucosa; Nasopharyngeal  Result Value Ref Range Status   MRSA by PCR NEGATIVE NEGATIVE Final    Comment:        The GeneXpert MRSA Assay (FDA approved for NASAL specimens only), is one component of a comprehensive MRSA colonization surveillance program. It is not intended to diagnose MRSA infection nor to guide or monitor treatment for MRSA infections. Performed at [facility], [place] [address] [place], [place] [address]    3/19 urinalysis 20 ketones, sg 1.028, rare bacteria; 0-5 wbc  Serology: 2/12 erlichia and rmsf serology in progress 3/20 tech blood smear review thrombocytopenia 3/20 lft wnl 3/19 procalcitonin 0.2 3/19 lft wnl; cbc 8/13/105  Micro: 3/19 bcx ngtd  Imaging: If present, new imagings (plain films, ct scans, and mri) have been personally visualized and interpreted; radiology reports have been reviewed. Decision making incorporated into the Impression / Recommendations.  11/26/20 ct head Atrophy, chronic microvascular disease.  No acute intracranial abnormality.  11/26/2020 chest xray Cardiomegaly with vascular congestion. Perihilar opacities could reflect edema, atelectasis or infiltrates.   11/03/2020 ct chest/abd/pelv 1. No change in a spiculated mass of the anterior left upper lobe. 2. Unchanged post radiation fibrotic scarring and volume loss of the perihilar right lung. 3. Unchanged spiculated soft tissue nodule or lymph node in the left axilla. 4. No evidence of metastatic disease in the abdomen or pelvis. 5. Multiple small bilateral renal calculi. No  hydronephrosis. A previously seen calculus in the distal right ureter is no longer present. 6. Coronary artery disease.    Jabier Mutton, [facility] [place] [phone] pager    11/27/2020, 10:25 AM

## 2020-11-27 NOTE — Progress Notes (Signed)
1:42 AM Patient transferred from [facility] due to SIRS with unknown source.  Tick was noted on patient's skin by RN, this will be extracted and placed in a specimen cup at bedside.  Haldol 2 mg x1 was given due to patient's agitation.  Surveyor, quantity and mittens were provided.  Fever has subsided.

## 2020-11-27 NOTE — Progress Notes (Signed)
[facility] night shift coverage note.  The staff reports that the patient fever has recurred with the current temperature of 102.7 F.  He was admitted with SIRS after presenting with fever, tachycardia tachypnea and altered mental status with no obvious source of infection.  He received a 650 mg suppository about 5 hours ago.  Toradol 15 mg IVP x1 dose and acetaminophen 650 mg PO./PR every 6 hours as needed ordered.  Tennis Must, MD.

## 2020-11-27 NOTE — Progress Notes (Addendum)
PROGRESS NOTE    Travis Padilla  DGU:[id number] DOB: 19-Sep-1945 DOA: 11/26/2020 PCP: [name], DO   Brief Narrative:  75 year old with history of stage IV cancer on immunotherapy follows with Dr. Earlie Server, hypothyroidism, CAD, anemia brought to the hospital for change in mental status, generalized weakness and fever.  Patient had sore throat/URI 2 days prior to admission and was started on Augmentin.  CT head negative, chest x-ray showed vascular congestion with perihilar opacity UA negative started on broad-spectrum antibiotics. Found to have a tick which was removed.    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  No obvious source identified therefore cannot confirm sepsis -Cultures performed -Empiric antibiotics-IV vancomycin, cefepime, ampicillin and acyclovir ID consulted.  -MRI brain -Transferred to [facility] for lumbar puncture -Procalcitonin 0.2 -LR 75 cc/h -Covid, flu-negative  Acute metabolic encephalopathy -CT head negative.  Suspect from underlying infection? -MRI brain- -No focal neuro deficits.  Continue neurochecks -Check TSH, B12, folate, ammonia levels  Addendum 145pm: Appears patient is having cardiac wheezing from fluid overload, will stop IVF. Give lasix 40mg  IV. Duonebs TID. Low threshold to start steroids due to underlying copd.   Tick bite noted, without rash- left axilla -Removed and placed in specimen cup overnight. Emperically started On Doxycyline. Discussed with ID  Lung cancer, SCC Hx of COPD with Chronic Bronchitis.  -On immunotherapy follows with Dr. Earlie Server  Essential hypertension -Home  meds on hold, IV hydralazine as needed.  Currently n.p.o.  Hypothyroidism -Currently n.p.o. IV levothyroxine schedule starting Tuesday  While npo added POC BS q6hrs  DVT prophylaxis: SCDs Start: 11/27/20 0600  Code Status: Full Family Communication:  Wife updated.   Status is: Inpatient  Remains inpatient appropriate because:Inpatient level of care appropriate due to severity of illness   Dispo: The patient is from: Home              Anticipated d/c is to: Home              Patient currently is not medically stable to d/c.  Patient patient is quite confused undergoing evaluation for meningitis and evaluation for his fevers.   Difficult to place patient No       Subjective: Patient is quite agitated and confused.  He had received Ativan right before my evaluation therefore drowsy.  Grossly moving all the extremities.   Examination:  Constitutional: Not in acute distress, drowsy Respiratory: Clear to auscultation bilaterally Cardiovascular: Normal sinus rhythm, no rubs Abdomen: Nontender nondistended good bowel sounds Musculoskeletal: No edema noted Skin: No rashes noted around the tick bite site in the left axilla.  It has been removed. Neurologic: Grossly moving all the extremities Psychiatric: Unable to assess Foley catheter in place  Objective: Vitals:   11/27/20 0131 11/27/20 0140 11/27/20 0300 11/27/20 0309  BP: (!) 143/68  (!) 157/98   Pulse: 94     Resp: (!) 23  (!) 26 17  Temp: 100.3 F (37.9 C)  99.8 F (37.7 C)   TempSrc: Rectal  Rectal   SpO2: 91% 94% 90% 96%  Weight:  111.5 kg    Height:  Intake/Output Summary (Last 24 hours) at 11/27/2020 0748 Last data filed at 11/27/2020 0400 Gross per 24 hour  Intake 5140.21 ml  Output 1000 ml  Net 4140.21 ml   Filed Weights   11/26/20 1825 11/27/20 0140  Weight: 110 kg 111.5 kg     Data Reviewed:   CBC: Recent Labs  Lab 11/26/20 1845  WBC 8.4  NEUTROABS 6.6  HGB 13.2  HCT 38.9*  MCV  96.0  PLT 267*   Basic Metabolic Panel: Recent Labs  Lab 11/26/20 1845 11/27/20 0612  NA 139 135  K 3.6 4.1  CL 106 108  CO2 22 22  GLUCOSE 136* 192*  BUN 19 17  CREATININE 1.19 1.15  CALCIUM 8.7* 8.4*   GFR: Estimated Creatinine Clearance: 76 mL/min (by C-G formula based on SCr of 1.15 mg/dL). Liver Function Tests: Recent Labs  Lab 11/26/20 1845 11/27/20 0612  AST 19 33  ALT 15 17  ALKPHOS 39 32*  BILITOT 0.9 1.2  PROT 7.6 6.1*  ALBUMIN 3.9 3.2*   No results for input(s): LIPASE, AMYLASE in the last 168 hours. No results for input(s): AMMONIA in the last 168 hours. Coagulation Profile: Recent Labs  Lab 11/26/20 1845 11/27/20 0612  INR 1.2 1.2   Cardiac Enzymes: No results for input(s): CKTOTAL, CKMB, CKMBINDEX, TROPONINI in the last 168 hours. BNP (last 3 results) No results for input(s): PROBNP in the last 8760 hours. HbA1C: No results for input(s): HGBA1C in the last 72 hours. CBG: Recent Labs  Lab 11/26/20 1826  GLUCAP 124*   Lipid Profile: No results for input(s): CHOL, HDL, LDLCALC, TRIG, CHOLHDL, LDLDIRECT in the last 72 hours. Thyroid Function Tests: No results for input(s): TSH, T4TOTAL, FREET4, T3FREE, THYROIDAB in the last 72 hours. Anemia Panel: No results for input(s): VITAMINB12, FOLATE, FERRITIN, TIBC, IRON, RETICCTPCT in the last 72 hours. Sepsis Labs: Recent Labs  Lab 11/26/20 1849 11/26/20 2020  PROCALCITON  --  0.20  LATICACIDVEN 1.6 1.5    Recent Results (from the past 240 hour(s))  Blood Culture (routine x 2)     Status: None (Preliminary result)   Collection Time: 11/26/20  6:45 PM   Specimen: BLOOD  Result Value Ref Range Status   Specimen Description BLOOD RIGHT ANTECUBITAL  Final   Special Requests   Final    BOTTLES DRAWN AEROBIC AND ANAEROBIC Blood Culture adequate volume Performed at [facility], [address] [place], [place] [address]    Culture PENDING  Incomplete   Report Status PENDING  Incomplete  Resp  Panel by RT-PCR (Flu A&B, Covid) Nasopharyngeal Swab     Status: None   Collection Time: 11/26/20  7:25 PM   Specimen: Nasopharyngeal Swab; Nasopharyngeal(NP) swabs in vial transport medium  Result Value Ref Range Status   SARS Coronavirus 2 by RT PCR NEGATIVE NEGATIVE Final    Comment: (NOTE) SARS-CoV-2 target nucleic acids are NOT DETECTED.  The SARS-CoV-2 RNA is generally detectable in upper respiratory specimens during the acute phase of infection. The lowest concentration of SARS-CoV-2 viral copies this assay can detect is 138 copies/mL. A negative result does not preclude SARS-Cov-2 infection and should not be used as the sole basis for treatment or other patient management decisions. A negative result may occur with  improper specimen collection/handling, submission of specimen other than nasopharyngeal swab, presence of viral mutation(s) within the areas targeted by this assay, and inadequate number of viral copies(<138 copies/mL). A negative result must be combined with clinical observations, patient history,  and epidemiological information. The expected result is Negative.  Fact Sheet for Patients:  EntrepreneurPulse.com.au  Fact Sheet for Healthcare Providers:  IncredibleEmployment.be  This test is no t yet approved or cleared by the [place] FDA and  has been authorized for detection and/or diagnosis of SARS-CoV-2 by FDA under an Emergency Use Authorization (EUA). This EUA will remain  in effect (meaning this test can be used) for the duration of the COVID-19 declaration under Section 564(b)(1) of the Act, 21 U.S.C.section 360bbb-3(b)(1), unless the authorization is terminated  or revoked sooner.       Influenza A by PCR NEGATIVE NEGATIVE Final   Influenza B by PCR NEGATIVE NEGATIVE Final    Comment: (NOTE) The Xpert Xpress SARS-CoV-2/FLU/RSV plus assay is intended as an aid in the diagnosis of influenza from Nasopharyngeal  swab specimens and should not be used as a sole basis for treatment. Nasal washings and aspirates are unacceptable for Xpert Xpress SARS-CoV-2/FLU/RSV testing.  Fact Sheet for Patients: EntrepreneurPulse.com.au  Fact Sheet for Healthcare Providers: IncredibleEmployment.be  This test is not yet approved or cleared by the [place] FDA and has been authorized for detection and/or diagnosis of SARS-CoV-2 by FDA under an Emergency Use Authorization (EUA). This EUA will remain in effect (meaning this test can be used) for the duration of the COVID-19 declaration under Section 564(b)(1) of the Act, 21 U.S.C. section 360bbb-3(b)(1), unless the authorization is terminated or revoked.  Performed at [facility], [address] [place], [place] [address]   Blood Culture (routine x 2)     Status: None (Preliminary result)   Collection Time: 11/26/20  7:32 PM   Specimen: BLOOD  Result Value Ref Range Status   Specimen Description BLOOD BLOOD LEFT HAND  Final   Special Requests   Final    BOTTLES DRAWN AEROBIC AND ANAEROBIC Blood Culture adequate volume Performed at [facility], [address], [place], [place] [address]    Culture PENDING  Incomplete   Report Status PENDING  Incomplete  MRSA PCR Screening     Status: None   Collection Time: 11/27/20  4:12 AM   Specimen: Nasal Mucosa; Nasopharyngeal  Result Value Ref Range Status   MRSA by PCR NEGATIVE NEGATIVE Final    Comment:        The GeneXpert MRSA Assay (FDA approved for NASAL specimens only), is one component of a comprehensive MRSA colonization surveillance program. It is not intended to diagnose MRSA infection nor to guide or monitor treatment for MRSA infections. Performed at [facility], [place] [address], [place], [place] [address]          Radiology Studies: CT Head Wo Contrast  Result Date: 11/26/2020 CLINICAL DATA:  Confusion EXAM: CT HEAD WITHOUT CONTRAST  TECHNIQUE: Contiguous axial images were obtained from the base of the skull through the vertex without intravenous contrast. COMPARISON:  08/19/2018 FINDINGS: Brain: There is atrophy and chronic small vessel disease changes. No acute intracranial abnormality. Specifically, no hemorrhage, hydrocephalus, mass lesion, acute infarction, or significant intracranial injury. Vascular: No hyperdense vessel or unexpected calcification. Skull: No acute calvarial abnormality. Sinuses/Orbits: Visualized paranasal sinuses and mastoids clear. Orbital soft tissues unremarkable. Other: None IMPRESSION: Atrophy, chronic microvascular disease. No acute intracranial abnormality. Electronically Signed   By: Rolm Baptise M.D.   On: 11/26/2020 20:13   DG Chest Port 1 View  Result Date: 11/26/2020 CLINICAL DATA:  Questionable sepsis, fever, altered mental status EXAM: PORTABLE CHEST 1 VIEW COMPARISON:  09/24/2017 FINDINGS: Cardiomegaly, vascular  congestion. Perihilar opacities could reflect atelectasis, infiltrates or edema. Right Port-A-Cath remains in place, unchanged. No effusions or acute bony abnormality. IMPRESSION: Cardiomegaly with vascular congestion. Perihilar opacities could reflect edema, atelectasis or infiltrates. Electronically Signed   By: Rolm Baptise M.D.   On: 11/26/2020 19:54        Scheduled Meds: . Chlorhexidine Gluconate Cloth  6 each Topical Daily  . mouth rinse  15 mL Mouth Rinse BID  . sodium chloride flush  10-40 mL Intracatheter Q12H   Continuous Infusions: . acyclovir 935 mg (11/27/20 0708)  . ampicillin (OMNIPEN) IV 2 g (11/27/20 0609)  . ceFEPime (MAXIPIME) IV Stopped (11/27/20 0244)  . lactated ringers 75 mL/hr at 11/27/20 0611  . vancomycin       LOS: 1 day   Time spent= 35 mins    Tennyson Wacha Arsenio Loader, MD [facility]  If 7PM-7AM, please contact night-coverage  11/27/2020, 7:48 AM

## 2020-11-27 NOTE — Progress Notes (Signed)
On admission to unit pt confused, agitated, and impulsive with poor safety awareness increased work of breathing. Pt continues to pull at lines and trying to get out of bed. On arrival [facility] stated that pt had a tick on him. RN assessed tick on upper left flank area. RN marked the redness around the tick and placed tegaderm over tick to make sure it stayed in place until further instructions received from on call MD. On call MD paged regarding pt being restless, agitated pulling at lines and the tick placement. MD instructed RN to remove tick and give medication to calm pt down. Medication ineffective. RN and other staff nurses removed tick's body but tick's head was not able to be removed due to pt restlessness and head was imbedded in the skin. RN made MD aware that head was not able to be removed and that the body is in a specimen cup in the pt room. Pt in bed still restless, will continue to monitor.

## 2020-11-27 NOTE — Plan of Care (Signed)

## 2020-11-27 NOTE — ED Notes (Signed)
[facility] at bedside to transport pt

## 2020-11-28 ENCOUNTER — Inpatient Hospital Stay (HOSPITAL_COMMUNITY): Payer: Medicare Other

## 2020-11-28 ENCOUNTER — Telehealth: Payer: Self-pay | Admitting: Internal Medicine

## 2020-11-28 DIAGNOSIS — A419 Sepsis, unspecified organism: Secondary | ICD-10-CM | POA: Diagnosis not present

## 2020-11-28 DIAGNOSIS — C7801 Secondary malignant neoplasm of right lung: Secondary | ICD-10-CM

## 2020-11-28 DIAGNOSIS — J0100 Acute maxillary sinusitis, unspecified: Secondary | ICD-10-CM | POA: Diagnosis not present

## 2020-11-28 DIAGNOSIS — G0490 Encephalitis and encephalomyelitis, unspecified: Secondary | ICD-10-CM | POA: Diagnosis not present

## 2020-11-28 DIAGNOSIS — R6520 Severe sepsis without septic shock: Secondary | ICD-10-CM | POA: Diagnosis not present

## 2020-11-28 DIAGNOSIS — I10 Essential (primary) hypertension: Secondary | ICD-10-CM | POA: Diagnosis not present

## 2020-11-28 DIAGNOSIS — E785 Hyperlipidemia, unspecified: Secondary | ICD-10-CM | POA: Diagnosis not present

## 2020-11-28 DIAGNOSIS — R6510 Systemic inflammatory response syndrome (SIRS) of non-infectious origin without acute organ dysfunction: Secondary | ICD-10-CM | POA: Diagnosis not present

## 2020-11-28 HISTORY — PX: IR FLUORO GUIDED NEEDLE PLC ASPIRATION/INJECTION LOC: IMG2395

## 2020-11-28 LAB — CBC
HCT: 29.5 % — ABNORMAL LOW (ref 39.0–52.0)
Hemoglobin: 10.9 g/dL — ABNORMAL LOW (ref 13.0–17.0)
MCH: 33.1 pg (ref 26.0–34.0)
MCHC: 36.9 g/dL — ABNORMAL HIGH (ref 30.0–36.0)
MCV: 89.7 fL (ref 80.0–100.0)
Platelets: 94 10*3/uL — ABNORMAL LOW (ref 150–400)
RBC: 3.29 MIL/uL — ABNORMAL LOW (ref 4.22–5.81)
RDW: 11.5 % (ref 11.5–15.5)
WBC: 7.3 10*3/uL (ref 4.0–10.5)
nRBC: 0 % (ref 0.0–0.2)

## 2020-11-28 LAB — PROCALCITONIN: Procalcitonin: 0.11 ng/mL

## 2020-11-28 LAB — COMPREHENSIVE METABOLIC PANEL
ALT: 19 U/L (ref 0–44)
AST: 29 U/L (ref 15–41)
Albumin: 3 g/dL — ABNORMAL LOW (ref 3.5–5.0)
Alkaline Phosphatase: 31 U/L — ABNORMAL LOW (ref 38–126)
Anion gap: 8 (ref 5–15)
BUN: 17 mg/dL (ref 8–23)
CO2: 26 mmol/L (ref 22–32)
Calcium: 8.4 mg/dL — ABNORMAL LOW (ref 8.9–10.3)
Chloride: 104 mmol/L (ref 98–111)
Creatinine, Ser: 0.92 mg/dL (ref 0.61–1.24)
GFR, Estimated: >60 mL/min (ref >60)
Glucose, Bld: 128 mg/dL — ABNORMAL HIGH (ref 70–99)
Potassium: 3.3 mmol/L — ABNORMAL LOW (ref 3.5–5.1)
Sodium: 138 mmol/L (ref 135–145)
Total Bilirubin: 1.6 mg/dL — ABNORMAL HIGH (ref 0.3–1.2)
Total Protein: 6.1 g/dL — ABNORMAL LOW (ref 6.5–8.1)

## 2020-11-28 LAB — GLUCOSE, CAPILLARY
Glucose-Capillary: 140 mg/dL — ABNORMAL HIGH (ref 70–99)
Glucose-Capillary: 95 mg/dL (ref 70–99)
Glucose-Capillary: 132 mg/dL — ABNORMAL HIGH (ref 70–99)

## 2020-11-28 LAB — CSF CELL COUNT WITH DIFFERENTIAL
Eosinophils, CSF: 0 % (ref 0–1)
Lymphs, CSF: 36 % — ABNORMAL LOW (ref 40–80)
Monocyte-Macrophage-Spinal Fluid: 62 % — ABNORMAL HIGH (ref 15–45)
Other Cells, CSF: 1
RBC Count, CSF: 63 /mm3 — ABNORMAL HIGH
Segmented Neutrophils-CSF: 1 % (ref 0–6)
Tube #: 1
WBC, CSF: 50 /mm3 (ref 0–5)

## 2020-11-28 LAB — URINE CULTURE: Culture: NO GROWTH

## 2020-11-28 LAB — FOLATE RBC

## 2020-11-28 LAB — MAGNESIUM: Magnesium: 1.8 mg/dL (ref 1.7–2.4)

## 2020-11-28 LAB — BRAIN NATRIURETIC PEPTIDE: B Natriuretic Peptide: 479.9 pg/mL — ABNORMAL HIGH (ref 0.0–100.0)

## 2020-11-28 LAB — PROTEIN AND GLUCOSE, CSF
Glucose, CSF: 70 mg/dL (ref 40–70)
Total  Protein, CSF: 43 mg/dL (ref 15–45)

## 2020-11-28 MED ORDER — VANCOMYCIN HCL 750 MG/150ML IV SOLN
750.0000 mg | Freq: Two times a day (BID) | INTRAVENOUS | Status: DC
  Administered 2020-11-28 (×2): 750 mg via INTRAVENOUS
  Filled 2020-11-28 (×3): qty 150

## 2020-11-28 MED ORDER — SODIUM CHLORIDE 0.9 % IV SOLN
2.0000 g | Freq: Three times a day (TID) | INTRAVENOUS | Status: DC
  Administered 2020-11-28 – 2020-11-30 (×7): 2 g via INTRAVENOUS
  Filled 2020-11-28 (×7): qty 2

## 2020-11-28 MED ORDER — MAGNESIUM SULFATE 2 GM/50ML IV SOLN
2.0000 g | Freq: Once | INTRAVENOUS | Status: AC
  Administered 2020-11-28: 2 g via INTRAVENOUS
  Filled 2020-11-28: qty 50

## 2020-11-28 MED ORDER — IPRATROPIUM-ALBUTEROL 0.5-2.5 (3) MG/3ML IN SOLN
3.0000 mL | Freq: Two times a day (BID) | RESPIRATORY_TRACT | Status: DC
  Administered 2020-11-29 – 2020-12-01 (×6): 3 mL via RESPIRATORY_TRACT
  Filled 2020-11-28 (×7): qty 3

## 2020-11-28 MED ORDER — POTASSIUM CHLORIDE 10 MEQ/100ML IV SOLN
10.0000 meq | INTRAVENOUS | Status: AC
Start: 2020-11-28 — End: 2020-11-28
  Administered 2020-11-28 (×4): 10 meq via INTRAVENOUS
  Filled 2020-11-28 (×4): qty 100

## 2020-11-28 MED ORDER — LIDOCAINE HCL (PF) 1 % IJ SOLN
INTRAMUSCULAR | Status: AC
  Filled 2020-11-28: qty 30

## 2020-11-28 MED ORDER — MIDAZOLAM HCL 2 MG/2ML IJ SOLN
INTRAMUSCULAR | Status: AC | PRN
Start: 2020-11-28 — End: 2020-11-28
  Administered 2020-11-28: 1 mg via INTRAVENOUS

## 2020-11-28 MED ORDER — SODIUM CHLORIDE 0.9 % IV SOLN
100.0000 mg | Freq: Two times a day (BID) | INTRAVENOUS | Status: DC
  Administered 2020-11-28 – 2020-12-02 (×8): 100 mg via INTRAVENOUS
  Filled 2020-11-28 (×10): qty 100

## 2020-11-28 MED ORDER — LIDOCAINE HCL (PF) 1 % IJ SOLN
INTRAMUSCULAR | Status: AC | PRN
  Administered 2020-11-28: 30 mL

## 2020-11-28 MED ORDER — SODIUM CHLORIDE 0.9 % IV SOLN
INTRAVENOUS | Status: AC | PRN
  Administered 2020-11-28: 10 mL/h via INTRAVENOUS

## 2020-11-28 MED ORDER — FENTANYL CITRATE (PF) 100 MCG/2ML IJ SOLN
INTRAMUSCULAR | Status: AC | PRN
  Administered 2020-11-28: 25 ug via INTRAVENOUS

## 2020-11-28 MED ORDER — DEXTROSE 5 % IV SOLN
935.0000 mg | Freq: Three times a day (TID) | INTRAVENOUS | Status: DC
  Administered 2020-11-28 – 2020-12-01 (×10): 935 mg via INTRAVENOUS
  Filled 2020-11-28 (×12): qty 18.7

## 2020-11-28 MED ORDER — SODIUM CHLORIDE 0.9 % IV SOLN
2.0000 g | INTRAVENOUS | Status: DC
  Administered 2020-11-28 – 2020-11-29 (×7): 2 g via INTRAVENOUS
  Filled 2020-11-28 (×10): qty 2000

## 2020-11-28 MED ORDER — FENTANYL CITRATE (PF) 100 MCG/2ML IJ SOLN
INTRAMUSCULAR | Status: AC
  Filled 2020-11-28: qty 2

## 2020-11-28 MED ORDER — MIDAZOLAM HCL 2 MG/2ML IJ SOLN
INTRAMUSCULAR | Status: AC
  Filled 2020-11-28: qty 2

## 2020-11-28 NOTE — Procedures (Signed)
Interventional Radiology Procedure:   Indications: Altered mental status, sepsis  Procedure: Lumbar puncture  Findings: Opening pressure 21 cm.  10 cm of clear CSF removed.  Complications: none     EBL: Minimal  Plan: Bedrest   Torryn Fiske R. Anselm Pancoast, MD  Pager: [phone]

## 2020-11-28 NOTE — Plan of Care (Signed)
  Problem: Clinical Measurements: Goal: Ability to maintain clinical measurements within normal limits will improve Outcome: Progressing Goal: Diagnostic test results will improve Outcome: Progressing Goal: Respiratory complications will improve Outcome: Progressing Goal: Cardiovascular complication will be avoided Outcome: Progressing   Problem: Nutrition: Goal: Adequate nutrition will be maintained Outcome: Progressing

## 2020-11-28 NOTE — Telephone Encounter (Signed)
Cancelled 3/22 appts per patient request as stated in sch msg. Patient is in hospital

## 2020-11-28 NOTE — Progress Notes (Addendum)
PROGRESS NOTE    Travis Padilla  AYT:[id number] DOB: Feb 19, 1946 DOA: 11/26/2020 PCP: [name], DO   Brief Narrative:  75 year old with history of stage IV cancer on immunotherapy follows with Dr. Earlie Server, hypothyroidism, CAD, anemia brought to the hospital for change in mental status, generalized weakness and fever.  Patient had sore throat/URI 2 days prior to admission and was started on Augmentin.  CT head negative, chest x-ray showed vascular congestion with perihilar opacity UA negative started on broad-spectrum antibiotics. Found to have a tick which was removed and started on doxycycline.  ID consulted.   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  No obvious source identified therefore cannot confirm sepsis -Cultures-NGTD -Empiric antibiotics-IV vancomycin, cefepime, ampicillin and acyclovir -ID consulted -MRI brain with and without contrast -Transferred to [facility] for lumbar puncture -Procalcitonin 0.2 -Covid, flu-negative  Lumbar puncture and MRI will need to be done under general sedation.  Spoke with IR.  Acute metabolic encephalopathy; Still confused AAOx 1 (baseline AAox3) -CT head negative.  Suspect from underlying infection? -MRI brain-pending -No focal neuro deficits.  Continue neurochecks -TSH B12 and ammonia normal.  Folate pending  Volume overload -Received IV Lasix yesterday.  Hold off on IV fluids today.  Closely monitor his volume status.  Tick bite noted, without rash- left axilla -Removed and placed in specimen  cup overnight. Emperically started On Doxycyline. Discussed with ID  Lung cancer, SCC Hx of COPD with Chronic Bronchitis.  -On immunotherapy follows with Dr. Earlie Server  Essential hypertension -Home meds on hold, IV hydralazine as needed.  Currently n.p.o.  Hypothyroidism -Currently n.p.o. IV levothyroxine schedule starting Tuesday  While npo added POC BS q6hrs  DVT prophylaxis: SCDs Start: 11/27/20 0600  Code Status: Full Family Communication: Had extensive discussion with patient's son and wife at bedside as well yesterday. Son updated today.   Status is: Inpatient  Remains inpatient appropriate because:Inpatient level of care appropriate due to severity of illness   Dispo: The patient is from: Home              Anticipated d/c is to: Home              Patient currently is not medically stable to d/c.  Patient patient is quite confused undergoing evaluation for meningitis and evaluation for his fevers.   Difficult to place patient No       Subjective: During my visit, patient was calm being taken for MRI. No complaints. He rested well overnight.   Visited again at 2pm: mentation significantly improved. Currently he is AAOx3.   Examination: Constitutional: Not in acute distress; on 4L [place] Respiratory: Clear to auscultation bilaterally Cardiovascular: Normal sinus rhythm, no rubs Abdomen: Nontender nondistended good bowel sounds Musculoskeletal: No edema noted Skin: No rashes seen Neurologic: no gross neuro deficit.  Psychiatric: Unable to assess. Only alert to name (baseline AAOx3)  Foley catheter in place  Objective: Vitals:   11/27/20 1500 11/27/20 2000 11/27/20 2352 11/28/20 0300  BP: 120/70 (!) 137/54 (!) 97/59   Pulse:  60 85  Resp:  (!) 24 18 20   Temp:   97.6 F (36.4 C) 97.6 F (36.4 C)  TempSrc:   Axillary Axillary  SpO2: 99% 96% 97%   Weight:      Height:        Intake/Output Summary (Last 24 hours) at 11/28/2020 0737 Last data filed at  11/28/2020 0400 Gross per 24 hour  Intake 1776.14 ml  Output 5650 ml  Net -3873.86 ml   Filed Weights   11/26/20 1825 11/27/20 0140  Weight: 110 kg 111.5 kg     Data Reviewed:   CBC: Recent Labs  Lab 11/26/20 1845 11/28/20 0453  WBC 8.4 7.3  NEUTROABS 6.6  --   HGB 13.2 10.9*  HCT 38.9* 29.5*  MCV 96.0 89.7  PLT 105* 94*   Basic Metabolic Panel: Recent Labs  Lab 11/26/20 1845 11/27/20 0612 11/28/20 0453  NA 139 135 138  K 3.6 4.1 3.3*  CL 106 108 104  CO2 22 22 26   GLUCOSE 136* 192* 128*  BUN 19 17 17   CREATININE 1.19 1.15 0.92  CALCIUM 8.7* 8.4* 8.4*  MG  --   --  1.8   GFR: Estimated Creatinine Clearance: 95 mL/min (by C-G formula based on SCr of 0.92 mg/dL). Liver Function Tests: Recent Labs  Lab 11/26/20 1845 11/27/20 0612 11/28/20 0453  AST 19 33 29  ALT 15 17 19   ALKPHOS 39 32* 31*  BILITOT 0.9 1.2 1.6*  PROT 7.6 6.1* 6.1*  ALBUMIN 3.9 3.2* 3.0*   No results for input(s): LIPASE, AMYLASE in the last 168 hours. Recent Labs  Lab 11/27/20 0825  AMMONIA <9*   Coagulation Profile: Recent Labs  Lab 11/26/20 1845 11/27/20 0612  INR 1.2 1.2   Cardiac Enzymes: No results for input(s): CKTOTAL, CKMB, CKMBINDEX, TROPONINI in the last 168 hours. BNP (last 3 results) No results for input(s): PROBNP in the last 8760 hours. HbA1C: No results for input(s): HGBA1C in the last 72 hours. CBG: Recent Labs  Lab 11/26/20 1826 11/27/20 2349 11/28/20 0650  GLUCAP 124* 150* 140*   Lipid Profile: No results for input(s): CHOL, HDL, LDLCALC, TRIG, CHOLHDL, LDLDIRECT in the last 72 hours. Thyroid Function Tests: Recent Labs    11/27/20 0825 11/27/20 1058  TSH 0.138*  --   FREET4  --  1.10   Anemia Panel: Recent Labs    11/27/20 0825  VITAMINB12 503   Sepsis Labs: Recent Labs  Lab 11/26/20 1849 11/26/20 2020 11/28/20 0453  PROCALCITON  --  0.20 0.11  LATICACIDVEN 1.6 1.5  --     Recent Results (from the past 240 hour(s))  Blood  Culture (routine x 2)     Status: None (Preliminary result)   Collection Time: 11/26/20  6:45 PM   Specimen: BLOOD  Result Value Ref Range Status   Specimen Description BLOOD RIGHT ANTECUBITAL  Final   Special Requests   Final    BOTTLES DRAWN AEROBIC AND ANAEROBIC Blood Culture adequate volume   Culture   Final    NO GROWTH < 24 HOURS Performed at [facility], [address], [place], [place] [address]    Report Status PENDING  Incomplete  Resp Panel by RT-PCR (Flu A&B, Covid) Nasopharyngeal Swab     Status: None   Collection Time: 11/26/20  7:25 PM   Specimen: Nasopharyngeal Swab; Nasopharyngeal(NP) swabs in vial transport medium  Result Value Ref Range Status   SARS Coronavirus 2 by RT PCR NEGATIVE NEGATIVE Final  Comment: (NOTE) SARS-CoV-2 target nucleic acids are NOT DETECTED.  The SARS-CoV-2 RNA is generally detectable in upper respiratory specimens during the acute phase of infection. The lowest concentration of SARS-CoV-2 viral copies this assay can detect is 138 copies/mL. A negative result does not preclude SARS-Cov-2 infection and should not be used as the sole basis for treatment or other patient management decisions. A negative result may occur with  improper specimen collection/handling, submission of specimen other than nasopharyngeal swab, presence of viral mutation(s) within the areas targeted by this assay, and inadequate number of viral copies(<138 copies/mL). A negative result must be combined with clinical observations, patient history, and epidemiological information. The expected result is Negative.  Fact Sheet for Patients:  EntrepreneurPulse.com.au  Fact Sheet for Healthcare Providers:  IncredibleEmployment.be  This test is no t yet approved or cleared by the [place] FDA and  has been authorized for detection and/or diagnosis of SARS-CoV-2 by FDA under an Emergency Use Authorization (EUA). This EUA will  remain  in effect (meaning this test can be used) for the duration of the COVID-19 declaration under Section 564(b)(1) of the Act, 21 U.S.C.section 360bbb-3(b)(1), unless the authorization is terminated  or revoked sooner.       Influenza A by PCR NEGATIVE NEGATIVE Final   Influenza B by PCR NEGATIVE NEGATIVE Final    Comment: (NOTE) The Xpert Xpress SARS-CoV-2/FLU/RSV plus assay is intended as an aid in the diagnosis of influenza from Nasopharyngeal swab specimens and should not be used as a sole basis for treatment. Nasal washings and aspirates are unacceptable for Xpert Xpress SARS-CoV-2/FLU/RSV testing.  Fact Sheet for Patients: EntrepreneurPulse.com.au  Fact Sheet for Healthcare Providers: IncredibleEmployment.be  This test is not yet approved or cleared by the [place] FDA and has been authorized for detection and/or diagnosis of SARS-CoV-2 by FDA under an Emergency Use Authorization (EUA). This EUA will remain in effect (meaning this test can be used) for the duration of the COVID-19 declaration under Section 564(b)(1) of the Act, 21 U.S.C. section 360bbb-3(b)(1), unless the authorization is terminated or revoked.  Performed at [facility] [address] [place], [place] [address]   Blood Culture (routine x 2)     Status: None (Preliminary result)   Collection Time: 11/26/20  7:32 PM   Specimen: BLOOD  Result Value Ref Range Status   Specimen Description BLOOD BLOOD LEFT HAND  Final   Special Requests   Final    BOTTLES DRAWN AEROBIC AND ANAEROBIC Blood Culture adequate volume   Culture   Final    NO GROWTH < 24 HOURS Performed at [facility], [address], [place], [place] [address]    Report Status PENDING  Incomplete  MRSA PCR Screening     Status: None   Collection Time: 11/27/20  4:12 AM   Specimen: Nasal Mucosa; Nasopharyngeal  Result Value Ref Range Status   MRSA by PCR NEGATIVE NEGATIVE Final    Comment:         The GeneXpert MRSA Assay (FDA approved for NASAL specimens only), is one component of a comprehensive MRSA colonization surveillance program. It is not intended to diagnose MRSA infection nor to guide or monitor treatment for MRSA infections. Performed at [facility], [place] [address] [place], [place] [address]          Radiology Studies: CT Head Wo Contrast  Result Date: 11/26/2020 CLINICAL DATA:  Confusion EXAM: CT HEAD WITHOUT CONTRAST TECHNIQUE: Contiguous axial images were obtained from the base  of the skull through the vertex without intravenous contrast. COMPARISON:  08/19/2018 FINDINGS: Brain: There is atrophy and chronic small vessel disease changes. No acute intracranial abnormality. Specifically, no hemorrhage, hydrocephalus, mass lesion, acute infarction, or significant intracranial injury. Vascular: No hyperdense vessel or unexpected calcification. Skull: No acute calvarial abnormality. Sinuses/Orbits: Visualized paranasal sinuses and mastoids clear. Orbital soft tissues unremarkable. Other: None IMPRESSION: Atrophy, chronic microvascular disease. No acute intracranial abnormality. Electronically Signed   By: Rolm Baptise M.D.   On: 11/26/2020 20:13   DG Chest Port 1 View  Result Date: 11/26/2020 CLINICAL DATA:  Questionable sepsis, fever, altered mental status EXAM: PORTABLE CHEST 1 VIEW COMPARISON:  09/24/2017 FINDINGS: Cardiomegaly, vascular congestion. Perihilar opacities could reflect atelectasis, infiltrates or edema. Right Port-A-Cath remains in place, unchanged. No effusions or acute bony abnormality. IMPRESSION: Cardiomegaly with vascular congestion. Perihilar opacities could reflect edema, atelectasis or infiltrates. Electronically Signed   By: Rolm Baptise M.D.   On: 11/26/2020 19:54        Scheduled Meds: . Chlorhexidine Gluconate Cloth  6 each Topical Daily  . fluticasone furoate-vilanterol  1 puff Inhalation Daily   And  . umeclidinium bromide  1  puff Inhalation Daily  . ipratropium-albuterol  3 mL Nebulization TID  . [START ON 11/29/2020] levothyroxine  100 mcg Intravenous Daily  . mouth rinse  15 mL Mouth Rinse BID  . senna-docusate  2 tablet Oral QHS  . sodium chloride flush  10-40 mL Intracatheter Q12H   Continuous Infusions: . acyclovir 935 mg (11/28/20 0554)  . ampicillin (OMNIPEN) IV    . ceFEPime (MAXIPIME) IV 2 g (11/28/20 0551)  . doxycycline (VIBRAMYCIN) IV    . magnesium sulfate bolus IVPB    . potassium chloride    . vancomycin       LOS: 2 days   Time spent= 35 mins    Mubarak Bevens Arsenio Loader, MD [facility]  If 7PM-7AM, please contact night-coverage  11/28/2020, 7:37 AM

## 2020-11-28 NOTE — Progress Notes (Signed)
Pharmacy Antibiotic Note  Travis Padilla is a 75 y.o. male admitted on 11/26/2020 with sepsis - likely meningitis.  Pharmacy consulted for meningitis coverage with vancomycin, cefepime, ampicillin and acyclovir. Doxycycline added given found to have tick on person at admission.   Under LP today - awaiting results. WBC 7.3, PCT 0.11, afebrile. Scr 0.92 (CrCl 95 mL/min).   Plan: Continue vancomycin 750mg   IV every 12 hours (goal trough 15-20 mcg/mL) >> will order level for 3/22 Continue cefepime 2gm IV every 8 hours Ampicillin 2gm IV every 4 hours Acyclovir 935 mg IV every 8 hours Doxycycline 100 mg BID  Will f/u renal function, micro data, and pt's clinical condition   Height: 6\' 2"  (188 cm) Weight: 111.5 kg (245 lb 13 oz) IBW/kg (Calculated) : 82.2  Temp (24hrs), Avg:97.6 F (36.4 C), Min:97.5 F (36.4 C), Max:97.7 F (36.5 C)  Recent Labs  Lab 11/26/20 1845 11/26/20 1849 11/26/20 2020 11/27/20 0441 11/27/20 0612 11/28/20 0453  WBC 8.4  --   --  7.2  --  7.3  CREATININE 1.19  --   --  1.18 1.15 0.92  LATICACIDVEN  --  1.6 1.5  --   --   --     Estimated Creatinine Clearance: 95 mL/min (by C-G formula based on SCr of 0.92 mg/dL).    Allergies  Allergen Reactions  . Pravastatin     Numbness in feet   . Iodine Hives and Rash    CT scan contrast caused rash and itching     Antimicrobials this admission: 3/19 cefepime >>  3/19 vancomycin >>  3/19 ampicillin >> 3/19 acyclovir >> 3/20 doxycycline >>  Microbiology results: 3/19 BCx >> ngtd 3/19 UCx >> ng  3/19 CSF cx>> ordered 3/19 Resp panel >> ordered  3/20 MRSA pcr negative   Thank you for allowing pharmacy to be a part of this patient's care.  Antonietta Jewel, PharmD, [place] Clinical Pharmacist  Phone: [phone] 11/28/2020 12:40 PM  Please check AMION for all [facility] phone numbers After 10:00 PM, call [place] [phone]

## 2020-11-28 NOTE — Progress Notes (Signed)
[facility]  Date of Admission:  11/26/2020      Total days of antibiotics 3  Doxycycline 3/20 >> current   Vancomycin 3/19 >> current   Ampicillin 3/19 >> current  Cefepime 3/19 >> current   Acyclovir 3/19 >> current            ASSESSMENT: Travis Padilla is a 75 y.o. 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 He also is frequently exposed to ticks/bugs and came in with embedded tick in left lateral torso near axillae. No rash, only contact bite noted from embedded head - that has since been removed.  Suspect possible tick borne illness (ehrlichia) given rapid improvement over the last 48 hours with doxy addition. Abrupt onset resolution of fevers as well since. Alternatively viral meningitis (hsv vs alternative).   Will FU results of LP and MRI (to be done today) prior to further recommendations. He was also on augmentin BID since 3/18 prior to hospitalization.    PLAN: 1. FU LP studies  2. Follow MRI of brain  3. FU tick-borne serologies.     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 Chlorhexidine Gluconate Cloth  6 each Topical Daily  . fluticasone furoate-vilanterol  1 puff Inhalation Daily   And  . umeclidinium bromide  1 puff Inhalation Daily  . ipratropium-albuterol  3 mL Nebulization TID  . [START ON 11/29/2020] levothyroxine  100 mcg Intravenous Daily  .  mouth rinse  15 mL Mouth Rinse BID  . senna-docusate  2 tablet Oral QHS  . sodium chloride flush  10-40 mL Intracatheter Q12H    SUBJECTIVE: He tells me he is doing much better today (as does his wife and hospitalist).  Able to tell me the correct year and orientation as to where he is. Fills me in with a history dating back to August 2021 when he and his wife first began to suffer from intermittent viral illnesses. They have traveled over the last few months, most recently to [place] (November 2021) and previously to [place]. He and his wife state that he has had fevers now off and on for about a month. Several negative COVID tests (rapids). Associated complaints of pressure in head, poor balance, body aches, diarrhea and chills. Saturday while upstairs he had an episode where he fell back on the bed and was not able to talk and was incontinent of urine as his wife found him and called for help. Tick was found embedded in left upper chest lateral to axillae. No rashes noted over the last month.   He is asking when he can go home and if he can have something to Travis Padilla.    Review of Systems: Review of Systems  Constitutional: Positive for chills and fever.  HENT: Positive for congestion and sinus pain.   Eyes: Negative for blurred vision.  Respiratory: Positive for cough. Negative for  sputum production.   Cardiovascular: Negative for chest pain and leg swelling.  Gastrointestinal: Positive for diarrhea (in February at illness onset). Negative for abdominal pain and vomiting.  Genitourinary: Negative for dysuria.  Musculoskeletal: Negative for neck pain.  Skin: Negative for rash.  Neurological: Positive for headaches.       Light sensitivity     Allergies  Allergen Reactions  . Pravastatin     Numbness in feet   . Iodine Hives and Rash    CT scan contrast caused rash and itching     OBJECTIVE: Vitals:   11/28/20 1011 11/28/20 1016 11/28/20 1035 11/28/20 1120  BP: 124/80  127/81 126/83 105/65  Pulse: 74 69 79 62  Resp: (!) 22 (!) 21 20 18   Temp:    97.7 F (36.5 C)  TempSrc:    Oral  SpO2: 100% 100% 99% 96%  Weight:      Height:       Body mass index is 31.56 kg/m.  Physical Exam Constitutional:      Comments: Resting comfortably in bed with his wife at side.   HENT:     Mouth/Throat:     Mouth: Mucous membranes are moist.     Pharynx: Oropharynx is clear.  Eyes:     General: No visual field deficit or scleral icterus. Cardiovascular:     Rate and Rhythm: Normal rate and regular rhythm.  Abdominal:     General: Bowel sounds are normal.     Palpations: Abdomen is soft.  Musculoskeletal:     Cervical back: Neck supple. No tenderness.  Lymphadenopathy:     Cervical: No cervical adenopathy.  Skin:    General: Skin is warm and dry.     Capillary Refill: Capillary refill takes less than 2 seconds.     Findings: No rash.  Neurological:     General: No focal deficit present.     Mental Status: He is alert and oriented to person, place, and time.     Cranial Nerves: No facial asymmetry.     Sensory: Sensation is intact.     Motor: Motor function is intact. No weakness.     Coordination: Coordination is intact.  Psychiatric:        Mood and Affect: Mood normal.        Behavior: Behavior normal.     Lab Results Lab Results  Component Value Date   WBC 7.3 11/28/2020   HGB 10.9 (L) 11/28/2020   HCT 29.5 (L) 11/28/2020   MCV 89.7 11/28/2020   PLT 94 (L) 11/28/2020    Lab Results  Component Value Date   CREATININE 0.92 11/28/2020   BUN 17 11/28/2020   NA 138 11/28/2020   K 3.3 (L) 11/28/2020   CL 104 11/28/2020   CO2 26 11/28/2020    Lab Results  Component Value Date   ALT 19 11/28/2020   AST 29 11/28/2020   ALKPHOS 31 (L) 11/28/2020   BILITOT 1.6 (H) 11/28/2020     Microbiology: Recent Results (from the past 240 hour(s))  Urine culture     Status: None   Collection Time: 11/26/20  6:28 PM   Specimen: In/Out Cath Urine   Result Value Ref Range Status   Specimen Description   Final    IN/OUT CATH URINE Performed at [facility], [address] [place], [place] [address]    Special Requests   Final    NONE Performed at [facility], [address], [place], [place] [address]  Culture   Final    NO GROWTH Performed at [facility], [place] [address] [place], [place] [address]    Report Status 11/28/2020 FINAL  Final  Blood Culture (routine x 2)     Status: None (Preliminary result)   Collection Time: 11/26/20  6:45 PM   Specimen: BLOOD  Result Value Ref Range Status   Specimen Description BLOOD RIGHT ANTECUBITAL  Final   Special Requests   Final    BOTTLES DRAWN AEROBIC AND ANAEROBIC Blood Culture adequate volume   Culture   Final    NO GROWTH 2 DAYS Performed at [facility], [address] [place], [place] [address]    Report Status PENDING  Incomplete  Resp Panel by RT-PCR (Flu A&B, Covid) Nasopharyngeal Swab     Status: None   Collection Time: 11/26/20  7:25 PM   Specimen: Nasopharyngeal Swab; Nasopharyngeal(NP) swabs in vial transport medium  Result Value Ref Range Status   SARS Coronavirus 2 by RT PCR NEGATIVE NEGATIVE Final    Comment: (NOTE) SARS-CoV-2 target nucleic acids are NOT DETECTED.  The SARS-CoV-2 RNA is generally detectable in upper respiratory specimens during the acute phase of infection. The lowest concentration of SARS-CoV-2 viral copies this assay can detect is 138 copies/mL. A negative result does not preclude SARS-Cov-2 infection and should not be used as the sole basis for treatment or other patient management decisions. A negative result may occur with  improper specimen collection/handling, submission of specimen other than nasopharyngeal swab, presence of viral mutation(s) within the areas targeted by this assay, and inadequate number of viral copies(<138 copies/mL). A negative result must be combined with clinical observations, patient history, and  epidemiological information. The expected result is Negative.  Fact Sheet for Patients:  EntrepreneurPulse.com.au  Fact Sheet for Healthcare Providers:  IncredibleEmployment.be  This test is no t yet approved or cleared by the [place] FDA and  has been authorized for detection and/or diagnosis of SARS-CoV-2 by FDA under an Emergency Use Authorization (EUA). This EUA will remain  in effect (meaning this test can be used) for the duration of the COVID-19 declaration under Section 564(b)(1) of the Act, 21 U.S.C.section 360bbb-3(b)(1), unless the authorization is terminated  or revoked sooner.       Influenza A by PCR NEGATIVE NEGATIVE Final   Influenza B by PCR NEGATIVE NEGATIVE Final    Comment: (NOTE) The Xpert Xpress SARS-CoV-2/FLU/RSV plus assay is intended as an aid in the diagnosis of influenza from Nasopharyngeal swab specimens and should not be used as a sole basis for treatment. Nasal washings and aspirates are unacceptable for Xpert Xpress SARS-CoV-2/FLU/RSV testing.  Fact Sheet for Patients: EntrepreneurPulse.com.au  Fact Sheet for Healthcare Providers: IncredibleEmployment.be  This test is not yet approved or cleared by the [place] FDA and has been authorized for detection and/or diagnosis of SARS-CoV-2 by FDA under an Emergency Use Authorization (EUA). This EUA will remain in effect (meaning this test can be used) for the duration of the COVID-19 declaration under Section 564(b)(1) of the Act, 21 U.S.C. section 360bbb-3(b)(1), unless the authorization is terminated or revoked.  Performed at [facility], [address] [place], [place] [address]   Blood Culture (routine x 2)     Status: None (Preliminary result)   Collection Time: 11/26/20  7:32 PM   Specimen: BLOOD  Result Value Ref Range Status   Specimen Description BLOOD BLOOD LEFT HAND  Final   Special Requests   Final  BOTTLES DRAWN AEROBIC AND ANAEROBIC Blood Culture adequate volume   Culture   Final    NO GROWTH 2 DAYS Performed at [facility], [address] [place], [place] [address]    Report Status PENDING  Incomplete  MRSA PCR Screening     Status: None   Collection Time: 11/27/20  4:12 AM   Specimen: Nasal Mucosa; Nasopharyngeal  Result Value Ref Range Status   MRSA by PCR NEGATIVE NEGATIVE Final    Comment:        The GeneXpert MRSA Assay (FDA approved for NASAL specimens only), is one component of a comprehensive MRSA colonization surveillance program. It is not intended to diagnose MRSA infection nor to guide or monitor treatment for MRSA infections. Performed at [facility], [place] [address], [place], [place] [address]     Janene Madeira, MSN, NP-C [facility] [email or web address] Pager: [phone] Office: [phone] [place]: [phone]

## 2020-11-28 NOTE — Consult Note (Signed)
Chief Complaint: Patient was seen in consultation today for Lumbar puncture with sedation Chief Complaint  Patient presents with  . Weakness   at the request of Dr Reesa Chew  Supervising Physician: Markus Daft  Patient Status: [facility] - In-pt  History of Present Illness: [facility] is a 75 y.o. male   Hx Lung Ca; back pain Admitted with sepsis; and AMS Thrombocytopenia Presence of tick on body  Negative work up so far; Bcx and imaging  Note from Dr Reesa Chew 3/20:  We are considering meningoencephalitis process with/without tick associated disease/thrombocytopenia. For cns consider hsv/bacterial meningitis/systemic process of tick disease. Initial approach would be to get mri brain/LP and blood smear (will need to review with pathology -- tech review only thrombocytopenia), f/u final blood culture, and check for endemic tick associated serology (rmsf << ehrlichia) while covering for appropriate causes considered  Was scheduled for LP in DG Rad Yesterday--- But too agitated and now LP has been requested with sedation.    Past Medical History:  Diagnosis Date  . Abnormal nuclear stress test    December, 2013  . Anemia   . Axillary adenopathy 04/24/2018  . CAD (coronary artery disease)    Mild nonobstructive plaque in cath 2013  . Chest pain    December, 2013  . Cough with hemoptysis 04/22/2018  . Dizziness   . Dyslipidemia 07/14/2019  . Encounter for antineoplastic immunotherapy 05/13/2018  . Gout   . Hemorrhoid   . Hyperlipidemia   . Hypertension   . Hypothyroidism (acquired) 08/11/2018  . 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 Obesity, unspecified 03/28/2009   Qualifier: Diagnosis of  By: [name], MD, [name]    . Palpitations 03/28/2009   Qualifier: Diagnosis of  By: [name], MD, [name]    . Right lower lobe lung mass 04/22/2018  .  Skin cancer   . SNORING 03/28/2009   Qualifier: Diagnosis of  By: [name], MD, [name]    . Spinal stenosis   . 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 BELPHAROPTOSIS REPAIR Bilateral   . CATARACT EXTRACTION W/ INTRAOCULAR LENS  IMPLANT, BILATERAL    . COLONOSCOPY N/A 11/03/2014   Procedure: COLONOSCOPY;  Surgeon: Rogene Houston, MD;  Location: [facility];  Service: Endoscopy;  Laterality: N/A;  1225  . IR CV LINE INJECTION  08/24/2020  . IR IMAGING GUIDED PORT INSERTION  07/11/2018  . KNEE CARTILAGE SURGERY Left    Left knee  . SKIN CANCER EXCISION  12/2014, 04/25/15  . VIDEO BRONCHOSCOPY Bilateral 05/09/2018   Procedure: VIDEO BRONCHOSCOPY WITH FLUORO;  Surgeon: Juanito Doom, MD;  Location: [facility] ENDOSCOPY;  Service: Cardiopulmonary;  Laterality: Bilateral;    Allergies: Pravastatin and Iodine  Medications: Prior to Admission medications   Medication Sig Start Date End Date Taking? Authorizing Provider  acetaminophen (TYLENOL) 500 MG tablet Take 1,000 mg by mouth every 6 (six) hours as needed.   Yes [provider]  albuterol (PROVENTIL) (2.5 MG/3ML) 0.083% nebulizer solution Inhale 3 mLs (2.5 mg total) into the lungs every 6 (six) hours as needed for wheezing or shortness of breath. 11/03/20 11/03/21 Yes Icard, Octavio Graves, DO  Alirocumab (PRALUENT) 150 MG/ML SOAJ Inject  150 mg into the skin every 14 (fourteen) days. 04/11/20  Yes [name], MD  ALPRAZolam Duanne Moron) 0.25 MG tablet Take 1 tablet (0.25 mg total) by mouth at bedtime as needed for anxiety. 10/19/20  Yes Curt Bears, MD  amoxicillin-clavulanate (AUGMENTIN) 875-125 MG tablet Take 1 tablet by mouth 2 (two) times daily for 7 days. 11/25/20 12/02/20 Yes Hendricks Limes F, FNP  Artificial Tear Solution (SOOTHE XP OP) Place 1 drop into both eyes 2 (two) times daily.   Yes [provider]   carvedilol (COREG) 12.5 MG tablet Take 1 tablet (12.5 mg total) by mouth in the morning, at noon, and at bedtime. 01/29/20  Yes [name], MD  ferrous sulfate 324 MG TBEC Take 324 mg by mouth daily with breakfast.   Yes [provider]  fluticasone (FLONASE) 50 MCG/ACT nasal spray Place 1 spray into both nostrils daily. 07/01/20  Yes Lauraine Rinne, NP  Fluticasone-Umeclidin-Vilant (TRELEGY ELLIPTA) 100-62.5-25 MCG/INH AEPB Inhale 1 puff into the lungs daily. 09/20/20  Yes Icard, Octavio Graves, DO  levothyroxine (SYNTHROID) 175 MCG tablet TAKE 1 TABLET BY MOUTH EVERY DAY BEFORE BREAKFAST Patient taking differently: Take 175 mcg by mouth daily before breakfast. 11/26/20  Yes Curt Bears, MD  lidocaine-prilocaine (EMLA) cream Apply 1 application topically as needed. 04/21/19  Yes Heilingoetter, Cassandra L, PA-C  methocarbamol (ROBAXIN) 500 MG tablet Take 500 mg by mouth daily. 08/11/19  Yes [provider]  oxyCODONE-acetaminophen (PERCOCET/ROXICET) 5-325 MG tablet Take 1 tablet by mouth every 8 (eight) hours as needed for severe pain. 08/16/20  Yes Heilingoetter, Cassandra L, PA-C  senna-docusate (SENNA S) 8.6-50 MG tablet 1 to 2 twice daily for constipation 07/07/18  Yes Tanner, Lyndon Code., PA-C  azelastine (ASTELIN) 0.1 % nasal spray Place 1 spray into both nostrils 2 (two) times daily. Patient not taking: No sig reported 07/27/20   [name], DO  Cyanocobalamin (VITAMIN B 12) 250 MCG LOZG Take by mouth. Patient not taking: No sig reported    [provider]  diphenhydramine-acetaminophen (TYLENOL PM) 25-500 MG TABS tablet Take 1-2 tablets by mouth at bedtime as needed (pain). Patient not taking: Reported on 11/26/2020    [provider]  Fluticasone-Umeclidin-Vilant (TRELEGY ELLIPTA) 100-62.5-25 MCG/INH AEPB Inhale 1 puff into the lungs daily. Patient not taking: No sig reported 09/20/20   Icard, Bradley L, DO  guaiFENesin-dextromethorphan (ROBITUSSIN  DM) 100-10 MG/5ML syrup Take 5 mLs by mouth every 4 (four) hours as needed for cough. Patient not taking: No sig reported    [provider]  Multiple Vitamins-Minerals (PRESERVISION AREDS 2 PO) Take by mouth. Patient not taking: No sig reported    [provider]     Family History  Problem Relation Age of Onset  . Heart attack Father   . Heart failure Mother   . Parkinson's disease Mother   . Healthy Sister   . Skin cancer Sister   . Neuropathy Brother   . COPD Brother   . Epilepsy Brother   . Healthy Sister   . Healthy Sister   . Colon cancer Neg Hx     Social History   Socioeconomic History  . Marital status: Married    Spouse name: Not on file  . Number of children: 1  . Years of education: Not on file  . Highest education level: Not on file  Occupational History  . Occupation: Government social research officer  Tobacco Use  . Smoking status: Former Smoker    Packs/day: 0.20  Years: 2.00    Pack years: 0.40    Types: Cigarettes    Start date: 10/01/1968  . Smokeless tobacco: Never Used  Vaping Use  . Vaping Use: Never used  Substance and Sexual Activity  . Alcohol use: No    Comment: hx of   . Drug use: No  . Sexual activity: Not on file  Other Topics Concern  . Not on file  Social History Narrative   Unable to ask intimate partner violence questions, wife present   Social Determinants of Health   Financial Resource Strain: Not on file  Food Insecurity: Not on file  Transportation Needs: Not on file  Physical Activity: Not on file  Stress: Not on file  Social Connections: Not on file    Review of Systems: A 12 point ROS discussed and pertinent positives are indicated in the HPI above.  All other systems are negative.   Vital Signs: BP (!) 97/59 (BP Location: Right Leg)   Pulse 85   Temp 97.6 F (36.4 C) (Axillary)   Resp 20   Ht 6\' 2"  (1.88 m)   Wt 245 lb 13 oz (111.5 kg)   SpO2 97%   BMI 31.56 kg/m   Physical Exam HENT:      Mouth/Throat:     Mouth: Mucous membranes are moist.  Cardiovascular:     Rate and Rhythm: Normal rate and regular rhythm.     Heart sounds: Normal heart sounds.  Pulmonary:     Effort: Pulmonary effort is normal.     Breath sounds: Normal breath sounds.  Abdominal:     Palpations: Abdomen is soft.  Musculoskeletal:        General: Normal range of motion.  Skin:    General: Skin is warm.  Neurological:     Mental Status: He is alert.     Comments: Answers all questions appropriately this am  Psychiatric:     Comments: Discussed LP with sedation via phone-- Wife Vaughan Basta is agreeable to proceed     Imaging: CT Head Wo Contrast  Result Date: 11/26/2020 CLINICAL DATA:  Confusion EXAM: CT HEAD WITHOUT CONTRAST TECHNIQUE: Contiguous axial images were obtained from the base of the skull through the vertex without intravenous contrast. COMPARISON:  08/19/2018 FINDINGS: Brain: There is atrophy and chronic small vessel disease changes. No acute intracranial abnormality. Specifically, no hemorrhage, hydrocephalus, mass lesion, acute infarction, or significant intracranial injury. Vascular: No hyperdense vessel or unexpected calcification. Skull: No acute calvarial abnormality. Sinuses/Orbits: Visualized paranasal sinuses and mastoids clear. Orbital soft tissues unremarkable. Other: None IMPRESSION: Atrophy, chronic microvascular disease. No acute intracranial abnormality. Electronically Signed   By: Rolm Baptise M.D.   On: 11/26/2020 20:13   CT Chest W Contrast  Result Date: 11/04/2020 CLINICAL DATA:  Primary Cancer Type: Lung Imaging Indication: Routine surveillance Interval therapy since last imaging? Yes Initial Cancer Diagnosis Date: 05/09/2018; Established by: Biopsy-proven Detailed Pathology: Stage IV non-small cell lung cancer, squamous cell carcinoma. Primary Tumor location: Right lower lobe mass and left upper lobe nodule. Left axillary mass. Surgeries: No. Chemotherapy: Yes; Ongoing?  No;  Most recent administration: 2019 Immunotherapy?  Yes; Type: Keytruda; Ongoing? Yes Radiation therapy? Yes; Date Range: 04/30/2018 - 05/21/2018; Target: Right lung and left axilla. EXAM: CT CHEST, ABDOMEN, AND PELVIS WITH CONTRAST TECHNIQUE: Multidetector CT imaging of the chest, abdomen and pelvis was performed following the standard protocol during bolus administration of intravenous contrast. CONTRAST:  175mL OMNIPAQUE IOHEXOL 300 MG/ML  SOLN COMPARISON:  Most recent  CT chest, abdomen and pelvis 08/15/2020. 05/05/2018 PET-CT. FINDINGS: CT CHEST FINDINGS Cardiovascular: Right chest port catheter. Aortic atherosclerosis. Normal heart size. Three-vessel coronary artery calcifications. No pericardial effusion. Mediastinum/Nodes: Unchanged spiculated soft tissue nodule or lymph node in the left axilla measuring 1.9 x 1.2 cm (series 2, image 6). Thyroid gland, trachea, and esophagus demonstrate no significant findings. Lungs/Pleura: No change in a spiculated mass of the anterior left upper lobe, measuring 3.2 x 2.6 cm (series 4, image 77). Unchanged fibrotic scarring and volume loss of the perihilar right lung (series 4, image 79). Unchanged background of innumerable tiny centrilobular pulmonary nodules, again likely smoking-related respiratory bronchiolitis. No pleural effusion or pneumothorax. Musculoskeletal: No chest wall mass or suspicious bone lesions identified. CT ABDOMEN PELVIS FINDINGS Hepatobiliary: No solid liver abnormality is seen. No gallstones, gallbladder wall thickening, or biliary dilatation. Pancreas: Unremarkable. No pancreatic ductal dilatation or surrounding inflammatory changes. Spleen: Normal in size without significant abnormality. Adrenals/Urinary Tract: Adrenal glands are unremarkable. Multiple small bilateral renal calculi. No hydronephrosis. A previously seen calculus in the distal right ureter is no longer present. Bladder is unremarkable. Stomach/Bowel: Stomach is within normal limits.  Appendix appears normal. No evidence of bowel wall thickening, distention, or inflammatory changes. Descending and sigmoid diverticulosis. Vascular/Lymphatic: Aortic atherosclerosis. No enlarged abdominal or pelvic lymph nodes. Reproductive: No mass or other abnormality. Other: No abdominal wall hernia or abnormality. No abdominopelvic ascites. Musculoskeletal: No acute or significant osseous findings. IMPRESSION: 1. No change in a spiculated mass of the anterior left upper lobe. 2. Unchanged post radiation fibrotic scarring and volume loss of the perihilar right lung. 3. Unchanged spiculated soft tissue nodule or lymph node in the left axilla. 4. No evidence of metastatic disease in the abdomen or pelvis. 5. Multiple small bilateral renal calculi. No hydronephrosis. A previously seen calculus in the distal right ureter is no longer present. 6. Coronary artery disease. Aortic Atherosclerosis (ICD10-I70.0). Electronically Signed   By: Eddie Candle M.D.   On: 11/04/2020 10:46   CT Abdomen Pelvis W Contrast  Result Date: 11/04/2020 CLINICAL DATA:  Primary Cancer Type: Lung Imaging Indication: Routine surveillance Interval therapy since last imaging? Yes Initial Cancer Diagnosis Date: 05/09/2018; Established by: Biopsy-proven Detailed Pathology: Stage IV non-small cell lung cancer, squamous cell carcinoma. Primary Tumor location: Right lower lobe mass and left upper lobe nodule. Left axillary mass. Surgeries: No. Chemotherapy: Yes; Ongoing?  No; Most recent administration: 2019 Immunotherapy?  Yes; Type: Keytruda; Ongoing? Yes Radiation therapy? Yes; Date Range: 04/30/2018 - 05/21/2018; Target: Right lung and left axilla. EXAM: CT CHEST, ABDOMEN, AND PELVIS WITH CONTRAST TECHNIQUE: Multidetector CT imaging of the chest, abdomen and pelvis was performed following the standard protocol during bolus administration of intravenous contrast. CONTRAST:  166mL OMNIPAQUE IOHEXOL 300 MG/ML  SOLN COMPARISON:  Most recent CT  chest, abdomen and pelvis 08/15/2020. 05/05/2018 PET-CT. FINDINGS: CT CHEST FINDINGS Cardiovascular: Right chest port catheter. Aortic atherosclerosis. Normal heart size. Three-vessel coronary artery calcifications. No pericardial effusion. Mediastinum/Nodes: Unchanged spiculated soft tissue nodule or lymph node in the left axilla measuring 1.9 x 1.2 cm (series 2, image 6). Thyroid gland, trachea, and esophagus demonstrate no significant findings. Lungs/Pleura: No change in a spiculated mass of the anterior left upper lobe, measuring 3.2 x 2.6 cm (series 4, image 77). Unchanged fibrotic scarring and volume loss of the perihilar right lung (series 4, image 79). Unchanged background of innumerable tiny centrilobular pulmonary nodules, again likely smoking-related respiratory bronchiolitis. No pleural effusion or pneumothorax. Musculoskeletal: No chest wall mass or  suspicious bone lesions identified. CT ABDOMEN PELVIS FINDINGS Hepatobiliary: No solid liver abnormality is seen. No gallstones, gallbladder wall thickening, or biliary dilatation. Pancreas: Unremarkable. No pancreatic ductal dilatation or surrounding inflammatory changes. Spleen: Normal in size without significant abnormality. Adrenals/Urinary Tract: Adrenal glands are unremarkable. Multiple small bilateral renal calculi. No hydronephrosis. A previously seen calculus in the distal right ureter is no longer present. Bladder is unremarkable. Stomach/Bowel: Stomach is within normal limits. Appendix appears normal. No evidence of bowel wall thickening, distention, or inflammatory changes. Descending and sigmoid diverticulosis. Vascular/Lymphatic: Aortic atherosclerosis. No enlarged abdominal or pelvic lymph nodes. Reproductive: No mass or other abnormality. Other: No abdominal wall hernia or abnormality. No abdominopelvic ascites. Musculoskeletal: No acute or significant osseous findings. IMPRESSION: 1. No change in a spiculated mass of the anterior left upper  lobe. 2. Unchanged post radiation fibrotic scarring and volume loss of the perihilar right lung. 3. Unchanged spiculated soft tissue nodule or lymph node in the left axilla. 4. No evidence of metastatic disease in the abdomen or pelvis. 5. Multiple small bilateral renal calculi. No hydronephrosis. A previously seen calculus in the distal right ureter is no longer present. 6. Coronary artery disease. Aortic Atherosclerosis (ICD10-I70.0). Electronically Signed   By: Eddie Candle M.D.   On: 11/04/2020 10:46   DG Chest Port 1 View  Result Date: 11/26/2020 CLINICAL DATA:  Questionable sepsis, fever, altered mental status EXAM: PORTABLE CHEST 1 VIEW COMPARISON:  09/24/2017 FINDINGS: Cardiomegaly, vascular congestion. Perihilar opacities could reflect atelectasis, infiltrates or edema. Right Port-A-Cath remains in place, unchanged. No effusions or acute bony abnormality. IMPRESSION: Cardiomegaly with vascular congestion. Perihilar opacities could reflect edema, atelectasis or infiltrates. Electronically Signed   By: Rolm Baptise M.D.   On: 11/26/2020 19:54    Labs:  CBC: Recent Labs    10/19/20 0940 11/08/20 1011 11/26/20 1845 11/28/20 0453  WBC 4.8 6.9 8.4 7.3  HGB 13.1 13.4 13.2 10.9*  HCT 37.7* 39.0 38.9* 29.5*  PLT 139* 197 105* 94*    COAGS: Recent Labs    11/26/20 1845 11/27/20 0612  INR 1.2 1.2  APTT 30  --     BMP: Recent Labs    04/12/20 0758 05/03/20 0825 05/24/20 0837 06/13/20 0826 07/05/20 1130 11/08/20 1011 11/26/20 1845 11/27/20 0612 11/28/20 0453  NA 140 143 140 140   < > 137 139 135 138  K 3.8 4.1 3.9 4.5   < > 4.5 3.6 4.1 3.3*  CL 108 110 109 110   < > 105 106 108 104  CO2 25 25 23 26    < > 22 22 22 26   GLUCOSE 97 96 94 101*   < > 107* 136* 192* 128*  BUN 13 13 17 13    < > 23 19 17 17   CALCIUM 9.5 9.3 8.9 8.8*   < > 8.8* 8.7* 8.4* 8.4*  CREATININE 0.92 0.87 0.89 0.96   < > 1.01 1.19 1.15 0.92  GFRNONAA >60 >60 >60 >60   < > >60 >60 >60 >60  GFRAA >60 >60  >60 >60  --   --   --   --   --    < > = values in this interval not displayed.    LIVER FUNCTION TESTS: Recent Labs    11/08/20 1011 11/26/20 1845 11/27/20 0612 11/28/20 0453  BILITOT 0.6 0.9 1.2 1.6*  AST 11* 19 33 29  ALT 16 15 17 19   ALKPHOS 47 39 32* 31*  PROT 7.4 7.6 6.1* 6.1*  ALBUMIN 3.8 3.9 3.2* 3.0*    TUMOR MARKERS: No results for input(s): AFPTM, CEA, CA199, CHROMGRNA in the last 8760 hours.  Assessment and Plan:  Scheduled for LP with sedation in IR today We will call for pt when can Spoke to pts wife Vaughan Basta via phone;  is aware of procedure benefits and risks including but not limited to: infection; bleeding; spinal fluid leak and headache She is agreeable to proceed Consent in IR  Thank you for this interesting consult.  I greatly enjoyed meeting AHAAN ZOBRIST and look forward to participating in their care.  A copy of this report was sent to the requesting provider on this date.  Electronically Signed: Lavonia Drafts, PA-C 11/28/2020, 8:13 AM   I spent a total of 20 Minutes    in face to face in clinical consultation, greater than 50% of which was counseling/coordinating care for LP with sedation

## 2020-11-29 ENCOUNTER — Inpatient Hospital Stay: Payer: Medicare Other | Admitting: Internal Medicine

## 2020-11-29 ENCOUNTER — Inpatient Hospital Stay: Payer: Medicare Other

## 2020-11-29 ENCOUNTER — Inpatient Hospital Stay (HOSPITAL_COMMUNITY): Payer: Medicare Other

## 2020-11-29 DIAGNOSIS — A419 Sepsis, unspecified organism: Secondary | ICD-10-CM | POA: Diagnosis not present

## 2020-11-29 DIAGNOSIS — J0100 Acute maxillary sinusitis, unspecified: Secondary | ICD-10-CM | POA: Diagnosis not present

## 2020-11-29 DIAGNOSIS — R6520 Severe sepsis without septic shock: Secondary | ICD-10-CM | POA: Diagnosis not present

## 2020-11-29 DIAGNOSIS — G0490 Encephalitis and encephalomyelitis, unspecified: Secondary | ICD-10-CM | POA: Diagnosis not present

## 2020-11-29 LAB — COMPREHENSIVE METABOLIC PANEL
ALT: 16 U/L (ref 0–44)
AST: 20 U/L (ref 15–41)
Albumin: 2.6 g/dL — ABNORMAL LOW (ref 3.5–5.0)
Alkaline Phosphatase: 29 U/L — ABNORMAL LOW (ref 38–126)
Anion gap: 6 (ref 5–15)
BUN: 20 mg/dL (ref 8–23)
CO2: 25 mmol/L (ref 22–32)
Calcium: 8.1 mg/dL — ABNORMAL LOW (ref 8.9–10.3)
Chloride: 107 mmol/L (ref 98–111)
Creatinine, Ser: 1.03 mg/dL (ref 0.61–1.24)
GFR, Estimated: >60 mL/min (ref >60)
Glucose, Bld: 127 mg/dL — ABNORMAL HIGH (ref 70–99)
Potassium: 3.2 mmol/L — ABNORMAL LOW (ref 3.5–5.1)
Sodium: 138 mmol/L (ref 135–145)
Total Bilirubin: 1.2 mg/dL (ref 0.3–1.2)
Total Protein: 5.6 g/dL — ABNORMAL LOW (ref 6.5–8.1)

## 2020-11-29 LAB — CBC
HCT: 30.6 % — ABNORMAL LOW (ref 39.0–52.0)
Hemoglobin: 10.8 g/dL — ABNORMAL LOW (ref 13.0–17.0)
MCH: 32.3 pg (ref 26.0–34.0)
MCHC: 35.3 g/dL (ref 30.0–36.0)
MCV: 91.6 fL (ref 80.0–100.0)
Platelets: 110 10*3/uL — ABNORMAL LOW (ref 150–400)
RBC: 3.34 MIL/uL — ABNORMAL LOW (ref 4.22–5.81)
RDW: 11.8 % (ref 11.5–15.5)
WBC: 5.3 10*3/uL (ref 4.0–10.5)
nRBC: 0 % (ref 0.0–0.2)

## 2020-11-29 LAB — MAGNESIUM: Magnesium: 1.8 mg/dL (ref 1.7–2.4)

## 2020-11-29 LAB — GLUCOSE, CAPILLARY: Glucose-Capillary: 83 mg/dL (ref 70–99)

## 2020-11-29 MED ORDER — LORAZEPAM 2 MG/ML IJ SOLN
2.0000 mg | Freq: Once | INTRAMUSCULAR | Status: AC | PRN
  Administered 2020-11-29: 2 mg via INTRAVENOUS
  Filled 2020-11-29: qty 1

## 2020-11-29 MED ORDER — LORAZEPAM 2 MG/ML IJ SOLN: 2.0000 mg | Freq: Once | INTRAMUSCULAR | Status: DC

## 2020-11-29 MED ORDER — GADOBUTROL 1 MMOL/ML IV SOLN
10.0000 mL | Freq: Once | INTRAVENOUS | Status: AC | PRN
  Administered 2020-11-29: 10 mL via INTRAVENOUS

## 2020-11-29 MED ORDER — POTASSIUM CHLORIDE CRYS ER 20 MEQ PO TBCR
40.0000 meq | EXTENDED_RELEASE_TABLET | ORAL | Status: AC
  Administered 2020-11-29 (×2): 40 meq via ORAL
  Filled 2020-11-29 (×2): qty 2

## 2020-11-29 NOTE — Progress Notes (Signed)
PROGRESS NOTE    Travis Padilla  JOA:[id number] DOB: June 02, 1946 DOA: 11/26/2020 PCP: [name], DO   Brief Narrative:  75 year old with history of stage IV cancer on immunotherapy follows with Dr. Earlie Server, hypothyroidism, CAD, anemia brought to the hospital for change in mental status, generalized weakness and fever.  Patient had sore throat/URI 2 days prior to admission and was started on Augmentin.  CT head negative, chest x-ray showed vascular congestion with perihilar opacity UA negative started on broad-spectrum antibiotics. Found to have a tick which was removed and started on doxycycline.  ID consulted.  LP performed under anesthesia on 3/21-no obvious evidence of infection, follow-up culture data.  On broad-spectrum antibiotics and antivirals.  Mentation has improved.  MRI brain with and without contrast pending.   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  No obvious source identified therefore cannot confirm sepsis -Cultures-NGTD.  Gram stain-no organisms seen -Empiric antibiotics-IV vancomycin, cefepime, ampicillin and acyclovir -LP performed 3/21 under general anesthesia-cultures are negative, HSV PCR pending. -ID consulted -MRI brain with and without contrast -Procalcitonin 0.2 -Covid, flu-negative  Acute metabolic encephalopathy; improved.  Alert awake oriented X3 -CT head negative.  Suspect from underlying infection? -MRI brain with and without contrast -pending -No focal neuro deficits.  Continue neurochecks -TSH  B12 and ammonia normal.    Tick bite noted, without rash- left axilla -Removed.  Currently on doxycycline.  Lung cancer, SCC Hx of COPD with Chronic Bronchitis.  -On immunotherapy follows with Dr. Earlie Server  Essential hypertension -Home meds on hold, IV hydralazine as needed.  Currently n.p.o.  Hypothyroidism -On IV levothyroxine.  If p.o. intake remains consistent, transition to p.o.  Remove his Foley catheter  DVT prophylaxis: SCDs Start: 11/27/20 0600  Code Status: Full Family Communication: Family updated  Status is: Inpatient  Remains inpatient appropriate because:Inpatient level of care appropriate due to severity of illness   Dispo: The patient is from: Home              Anticipated d/c is to: Home              Patient currently is not medically stable to d/c.  Patient patient is quite confused undergoing evaluation for meningitis and evaluation for his fevers.   Difficult to place patient No       Subjective: Feels great sitting up in the chair no complaints.  He mentions he gets claustrophobic with MRI therefore requesting Ativan prior to his MRI.  Blood pressure cuff is off attached to the bed therefore inaccurate readings in the computer.  Have notified the nursing staff.  Examination: Constitutional: Not in acute distress Respiratory: Clear to auscultation bilaterally Cardiovascular: Normal sinus rhythm, no rubs Abdomen: Nontender nondistended good bowel sounds Musculoskeletal: No edema noted Skin: No rashes seen Neurologic: CN 2-12 grossly intact.  And nonfocal Psychiatric: Normal judgment and insight. Alert and oriented x 3. Normal mood.  Foley catheter in place  Objective: Vitals:   11/28/20 2300 11/29/20 0300 11/29/20 0736 11/29/20 0739  BP: 90/60 (!) 98/47  Pulse: 90 65    Resp: 15 17    Temp: 98.1 F (36.7 C) 97.9 F (36.6 C)    TempSrc: Oral Oral    SpO2: 96% 95% 92% 94%  Weight:      Height:        Intake/Output Summary (Last  24 hours) at 11/29/2020 0742 Last data filed at 11/29/2020 0300 Gross per 24 hour  Intake 1228.67 ml  Output 900 ml  Net 328.67 ml   Filed Weights   11/26/20 1825 11/27/20 0140  Weight: 110 kg 111.5 kg     Data Reviewed:   CBC: Recent Labs  Lab 11/26/20 1845 11/27/20 0441 11/27/20 0825 11/28/20 0453 11/29/20 0052  WBC 8.4 7.2  --  7.3 5.3  NEUTROABS 6.6  --   --   --   --   HGB 13.2 11.2*  --  10.9* 10.8*  HCT 38.9* 30.9* NOT PERFORMED 29.5* 30.6*  MCV 96.0 92.5  --  89.7 91.6  PLT 105* PLATELET CLUMPS NOTED ON SMEAR, COUNT APPEARS DECREASED  --  94* 829*   Basic Metabolic Panel: Recent Labs  Lab 11/26/20 1845 11/27/20 0441 11/27/20 0612 11/28/20 0453 11/29/20 0052  NA 139 135 135 138 138  K 3.6 3.7 4.1 3.3* 3.2*  CL 106 105 108 104 107  CO2 22 22 22 26 25   GLUCOSE 136* 195* 192* 128* 127*  BUN 19 18 17 17 20   CREATININE 1.19 1.18 1.15 0.92 1.03  CALCIUM 8.7* 8.4* 8.4* 8.4* 8.1*  MG  --   --   --  1.8 1.8   GFR: Estimated Creatinine Clearance: 84.8 mL/min (by C-G formula based on SCr of 1.03 mg/dL). Liver Function Tests: Recent Labs  Lab 11/26/20 1845 11/27/20 0612 11/28/20 0453 11/29/20 0052  AST 19 33 29 20  ALT 15 17 19 16   ALKPHOS 39 32* 31* 29*  BILITOT 0.9 1.2 1.6* 1.2  PROT 7.6 6.1* 6.1* 5.6*  ALBUMIN 3.9 3.2* 3.0* 2.6*   No results for input(s): LIPASE, AMYLASE in the last 168 hours. Recent Labs  Lab 11/27/20 0825  AMMONIA <9*   Coagulation Profile: Recent Labs  Lab 11/26/20 1845 11/27/20 0612  INR 1.2 1.2   Cardiac Enzymes: No results for input(s): CKTOTAL, CKMB, CKMBINDEX, TROPONINI in the last 168 hours. BNP (last 3 results) No results for input(s): PROBNP in the last 8760 hours. HbA1C: No results for input(s): HGBA1C in the last 72 hours. CBG: Recent Labs  Lab 11/27/20 2349 11/28/20 0650 11/28/20 1120 11/28/20 1737 11/29/20 0528  GLUCAP 150* 140* 95 132* 83   Lipid Profile: No results for input(s): CHOL, HDL,  LDLCALC, TRIG, CHOLHDL, LDLDIRECT in the last 72 hours. Thyroid Function Tests: Recent Labs    11/27/20 0825 11/27/20 1058  TSH 0.138*  --   FREET4  --  1.10   Anemia Panel: Recent Labs    11/27/20 0825  VITAMINB12 503   Sepsis Labs: Recent Labs  Lab 11/26/20 1849 11/26/20 2020 11/27/20 0441 11/28/20 0453  PROCALCITON  --  0.20 0.16 0.11  LATICACIDVEN 1.6 1.5  --   --     Recent Results (from the past 240 hour(s))  Urine culture     Status: None   Collection Time: 11/26/20  6:28 PM   Specimen: In/Out Cath Urine  Result Value Ref Range Status   Specimen Description   Final    IN/OUT CATH URINE Performed at [facility], [address], [place], [place] [address]  Special Requests   Final    NONE Performed at [facility], [address], [place], [place] [address]    Culture   Final    NO GROWTH Performed at [facility], [place] [address], [place], [place] [address]    Report Status 11/28/2020 FINAL  Final  Blood Culture (routine x 2)     Status: None (Preliminary result)   Collection Time: 11/26/20  6:45 PM   Specimen: BLOOD  Result Value Ref Range Status   Specimen Description BLOOD RIGHT ANTECUBITAL  Final   Special Requests   Final    BOTTLES DRAWN AEROBIC AND ANAEROBIC Blood Culture adequate volume   Culture   Final    NO GROWTH 2 DAYS Performed at [facility], [address] [place], [place] [address]    Report Status PENDING  Incomplete  Resp Panel by RT-PCR (Flu A&B, Covid) Nasopharyngeal Swab     Status: None   Collection Time: 11/26/20  7:25 PM   Specimen: Nasopharyngeal Swab; Nasopharyngeal(NP) swabs in vial transport medium  Result Value Ref Range Status   SARS Coronavirus 2 by RT PCR NEGATIVE NEGATIVE Final    Comment: (NOTE) SARS-CoV-2 target nucleic acids are NOT DETECTED.  The SARS-CoV-2 RNA is generally detectable in upper respiratory specimens during the acute phase of infection. The lowest concentration of SARS-CoV-2 viral  copies this assay can detect is 138 copies/mL. A negative result does not preclude SARS-Cov-2 infection and should not be used as the sole basis for treatment or other patient management decisions. A negative result may occur with  improper specimen collection/handling, submission of specimen other than nasopharyngeal swab, presence of viral mutation(s) within the areas targeted by this assay, and inadequate number of viral copies(<138 copies/mL). A negative result must be combined with clinical observations, patient history, and epidemiological information. The expected result is Negative.  Fact Sheet for Patients:  EntrepreneurPulse.com.au  Fact Sheet for Healthcare Providers:  IncredibleEmployment.be  This test is no t yet approved or cleared by the [place] FDA and  has been authorized for detection and/or diagnosis of SARS-CoV-2 by FDA under an Emergency Use Authorization (EUA). This EUA will remain  in effect (meaning this test can be used) for the duration of the COVID-19 declaration under Section 564(b)(1) of the Act, 21 U.S.C.section 360bbb-3(b)(1), unless the authorization is terminated  or revoked sooner.       Influenza A by PCR NEGATIVE NEGATIVE Final   Influenza B by PCR NEGATIVE NEGATIVE Final    Comment: (NOTE) The Xpert Xpress SARS-CoV-2/FLU/RSV plus assay is intended as an aid in the diagnosis of influenza from Nasopharyngeal swab specimens and should not be used as a sole basis for treatment. Nasal washings and aspirates are unacceptable for Xpert Xpress SARS-CoV-2/FLU/RSV testing.  Fact Sheet for Patients: EntrepreneurPulse.com.au  Fact Sheet for Healthcare Providers: IncredibleEmployment.be  This test is not yet approved or cleared by the [place] FDA and has been authorized for detection and/or diagnosis of SARS-CoV-2 by FDA under an Emergency Use Authorization (EUA). This  EUA will remain in effect (meaning this test can be used) for the duration of the COVID-19 declaration under Section 564(b)(1) of the Act, 21 U.S.C. section 360bbb-3(b)(1), unless the authorization is terminated or revoked.  Performed at [facility], [address], [place], [place] [address]   Blood Culture (routine x 2)     Status: None (Preliminary result)   Collection Time: 11/26/20  7:32 PM   Specimen: BLOOD  Result Value  Ref Range Status   Specimen Description BLOOD BLOOD LEFT HAND  Final   Special Requests   Final    BOTTLES DRAWN AEROBIC AND ANAEROBIC Blood Culture adequate volume   Culture   Final    NO GROWTH 2 DAYS Performed at [facility], [address], [place], [place] [address]    Report Status PENDING  Incomplete  MRSA PCR Screening     Status: None   Collection Time: 11/27/20  4:12 AM   Specimen: Nasal Mucosa; Nasopharyngeal  Result Value Ref Range Status   MRSA by PCR NEGATIVE NEGATIVE Final    Comment:        The GeneXpert MRSA Assay (FDA approved for NASAL specimens only), is one component of a comprehensive MRSA colonization surveillance program. It is not intended to diagnose MRSA infection nor to guide or monitor treatment for MRSA infections. Performed at [facility], [place] [address] [place], [place] [address]   CSF culture     Status: None (Preliminary result)   Collection Time: 11/28/20 10:31 AM   Specimen: CSF; Cerebrospinal Fluid  Result Value Ref Range Status   Specimen Description CSF  Final   Special Requests NONE  Final   Gram Stain   Final    WBC PRESENT, PREDOMINANTLY MONONUCLEAR NO ORGANISMS SEEN CYTOSPIN SMEAR Performed at [facility], [place] [address], [place], [place] [address]    Culture PENDING  Incomplete   Report Status PENDING  Incomplete         Radiology Studies: IR Fluoro Guide Ndl Plmt / BX  Result Date: 11/28/2020 INDICATION: 75 year old with altered mental status. History of lung cancer. Request  for fluoroscopic guided lumbar puncture with sedation. EXAM: LUMBAR PUNCTURE WITH FLUOROSCOPIC GUIDANCE MEDICATIONS: Moderate sedation ANESTHESIA/SEDATION: Fentanyl 25 mcg IV; Versed 1.0 mg IV Moderate Sedation Time:  24 minutes The patient was continuously monitored during the procedure by the interventional radiology nurse under my direct supervision. COMPLICATIONS: None immediate. PROCEDURE: Informed consent was obtained for a lumbar puncture. Patient was placed prone on the interventional table. The L3-L4 level was targeted. Overlying skin was prepped with Betadine and sterile field was created. Skin was anesthetized using 1% lidocaine. 20 gauge spinal needle was directed into the thecal sac using fluoroscopic guidance. Clear CSF was identified. Opening pressure was 21 cm. Approximately 10 mL of fluid was collected. Needle was removed without complication. Bandage placed over the puncture site. Fluoroscopic images were taken and saved for this procedure. FINDINGS: Opening pressure was 21 cm. Approximately 10 mL of clear CSF was removed. IMPRESSION: Successful lumbar puncture with fluoroscopic guidance. Electronically Signed   By: Markus Daft M.D.   On: 11/28/2020 16:27        Scheduled Meds: . Chlorhexidine Gluconate Cloth  6 each Topical Daily  . fluticasone furoate-vilanterol  1 puff Inhalation Daily   And  . umeclidinium bromide  1 puff Inhalation Daily  . ipratropium-albuterol  3 mL Nebulization BID  . levothyroxine  100 mcg Intravenous Daily  . mouth rinse  15 mL Mouth Rinse BID  . senna-docusate  2 tablet Oral QHS  . sodium chloride flush  10-40 mL Intracatheter Q12H   Continuous Infusions: . acyclovir 935 mg (11/29/20 0655)  . ampicillin (OMNIPEN) IV 2 g (11/29/20 0339)  . ceFEPime (MAXIPIME) IV 2 g (11/29/20 0551)  . doxycycline (VIBRAMYCIN) IV 100 mg (11/29/20 0003)  . vancomycin Stopped (11/29/20 0725)     LOS: 3 days   Time spent= 35 mins  Ankit Arsenio Loader, MD [facility]  If 7PM-7AM, please contact night-coverage  11/29/2020, 7:42 AM

## 2020-11-29 NOTE — Plan of Care (Signed)

## 2020-11-29 NOTE — Progress Notes (Signed)
[facility] for Infectious Disease  Date of Admission:  11/26/2020      Total days of antibiotics 3  Doxycycline 3/20 >> current   Vancomycin 3/19 >> 3/22  Ampicillin 3/19 >> 3/22  Cefepime 3/19 >> current   Acyclovir 3/19 >> current            ASSESSMENT: RUSHTON EARLY is a 75 y.o. 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   Much improved over the last 48 hours. His LP (after 1 week of PO Augmentin and a few days of broad spectrum IV abx) revealed WBC 50 with 1% neuts, Normal protein and glucose. Would find it very abnormal to have corrected this quickly if this is reflective of partially treated bacterial meningitis. Suspect aseptic meningitis vs tick borne illness. HSV pcr pending as is the ehrlichia serology (Ab/pcr). Platelets are recovering. Will stop ampicillin and vancomycin now. Likely to D/C cefepime after MRI results to confirm.   Few case reports document possible auto-immune encephalitis with pembrolizumab - would be curious to see what neurology's thoughts are about this possibility and if considered what diagnostics would be best used to determine this.   Serum creatinine 1.03 on acyclovir - continue to follow/trend.    PLAN: 1. Stop ampicillin + vancomycin 2. Follow HSV pcr 3. Continue acyclovir until #2 - can D/C if negative.  4. Continue doxycycline 5. Follow MRI results (planned today) 6. 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 Chlorhexidine Gluconate Cloth  6 each Topical Daily  . fluticasone furoate-vilanterol  1 puff Inhalation Daily   And  . umeclidinium bromide  1 puff Inhalation Daily  . ipratropium-albuterol  3 mL Nebulization BID  . levothyroxine  100 mcg Intravenous Daily  . mouth rinse  15 mL Mouth Rinse BID  . potassium chloride  40 mEq Oral Q4H  . senna-docusate  2 tablet Oral QHS  . sodium chloride flush  10-40 mL Intracatheter Q12H    SUBJECTIVE: Doing much better - headaches are resolved. No back pain s/p LP.  "Happy to be vertical today."  No new complaints.    Review of Systems: Review of Systems  Constitutional: Negative for chills and fever.  Respiratory: Negative for cough and shortness of breath.   Musculoskeletal: Negative for back pain and neck pain.  Skin: Negative for rash.  Neurological: Negative for dizziness, speech change, focal weakness, weakness and headaches.    Allergies  Allergen Reactions  . Pravastatin     Numbness in feet   . Iodine Hives and Rash    CT scan contrast caused rash and itching     OBJECTIVE: Vitals:   11/29/20 0736 11/29/20 0739 11/29/20 0813 11/29/20 1026  BP:   (!) 97/58 105/62  Pulse:   81 83  Resp:   20 (!) 24  Temp:   97.9 F (36.6 C) 97.8 F (36.6 C)  TempSrc:   Oral Oral  SpO2: 92% 94% 96% 94%  Weight:      Height:       Body mass index is 31.56 kg/m.  Physical Exam Constitutional:      Appearance: He is well-developed.     Comments: Seated comfortably in recliner   HENT:     Mouth/Throat:     Dentition: Normal dentition. No dental abscesses.  Cardiovascular:     Rate and Rhythm: Normal rate and regular rhythm.     Heart sounds: Normal heart sounds.  Pulmonary:     Effort: Pulmonary effort is normal.     Breath sounds: Normal breath sounds.  Abdominal:     General: There is no distension.     Palpations: Abdomen is soft.     Tenderness: There is no abdominal tenderness.   Lymphadenopathy:     Cervical: No cervical adenopathy.  Skin:    General: Skin is warm and dry.     Findings: No rash.  Neurological:     Mental Status: He is alert and oriented to person, place, and time.     Sensory: No sensory deficit.     Coordination: Coordination normal.  Psychiatric:        Judgment: Judgment normal.     Comments: In good spirits today and engaged in care discussion.      Lab Results Lab Results  Component Value Date   WBC 5.3 11/29/2020   HGB 10.8 (L) 11/29/2020   HCT 30.6 (L) 11/29/2020   MCV 91.6 11/29/2020   PLT 110 (L) 11/29/2020    Lab Results  Component Value Date   CREATININE 1.03 11/29/2020   BUN 20 11/29/2020   NA 138 11/29/2020   K 3.2 (L) 11/29/2020   CL 107 11/29/2020   CO2 25 11/29/2020    Lab Results  Component Value Date   ALT 16 11/29/2020   AST 20 11/29/2020   ALKPHOS 29 (L) 11/29/2020   BILITOT 1.2 11/29/2020     Microbiology: Recent Results (from the past 240 hour(s))  Urine culture     Status: None   Collection Time: 11/26/20  6:28 PM   Specimen: In/Out Cath Urine  Result Value Ref Range Status   Specimen Description   Final    IN/OUT CATH URINE Performed at [facility], [address] [place], [place] [address]    Special Requests   Final    NONE Performed at [facility], [address] [place], [place] [address]    Culture   Final    NO GROWTH Performed at [facility], [place] [address], [place], [place] [address]    Report Status 11/28/2020 FINAL  Final  Blood Culture (routine x 2)     Status: None (Preliminary result)   Collection Time: 11/26/20  6:45 PM   Specimen: BLOOD  Result Value Ref Range Status   Specimen Description BLOOD RIGHT ANTECUBITAL  Final   Special Requests   Final    BOTTLES DRAWN AEROBIC AND ANAEROBIC Blood Culture adequate volume   Culture   Final    NO GROWTH 3 DAYS Performed at [facility], [address] [place], [place] [address]    Report Status PENDING  Incomplete   Resp Panel by RT-PCR (Flu A&B, Covid) Nasopharyngeal Swab     Status: None   Collection Time: 11/26/20  7:25 PM   Specimen: Nasopharyngeal Swab; Nasopharyngeal(NP) swabs in vial transport medium  Result Value Ref Range Status  SARS Coronavirus 2 by RT PCR NEGATIVE NEGATIVE Final    Comment: (NOTE) SARS-CoV-2 target nucleic acids are NOT DETECTED.  The SARS-CoV-2 RNA is generally detectable in upper respiratory specimens during the acute phase of infection. The lowest concentration of SARS-CoV-2 viral copies this assay can detect is 138 copies/mL. A negative result does not preclude SARS-Cov-2 infection and should not be used as the sole basis for treatment or other patient management decisions. A negative result may occur with  improper specimen collection/handling, submission of specimen other than nasopharyngeal swab, presence of viral mutation(s) within the areas targeted by this assay, and inadequate number of viral copies(<138 copies/mL). A negative result must be combined with clinical observations, patient history, and epidemiological information. The expected result is Negative.  Fact Sheet for Patients:  EntrepreneurPulse.com.au  Fact Sheet for Healthcare Providers:  IncredibleEmployment.be  This test is no t yet approved or cleared by the [place] FDA and  has been authorized for detection and/or diagnosis of SARS-CoV-2 by FDA under an Emergency Use Authorization (EUA). This EUA will remain  in effect (meaning this test can be used) for the duration of the COVID-19 declaration under Section 564(b)(1) of the Act, 21 U.S.C.section 360bbb-3(b)(1), unless the authorization is terminated  or revoked sooner.       Influenza A by PCR NEGATIVE NEGATIVE Final   Influenza B by PCR NEGATIVE NEGATIVE Final    Comment: (NOTE) The Xpert Xpress SARS-CoV-2/FLU/RSV plus assay is intended as an aid in the diagnosis of influenza from  Nasopharyngeal swab specimens and should not be used as a sole basis for treatment. Nasal washings and aspirates are unacceptable for Xpert Xpress SARS-CoV-2/FLU/RSV testing.  Fact Sheet for Patients: EntrepreneurPulse.com.au  Fact Sheet for Healthcare Providers: IncredibleEmployment.be  This test is not yet approved or cleared by the [place] FDA and has been authorized for detection and/or diagnosis of SARS-CoV-2 by FDA under an Emergency Use Authorization (EUA). This EUA will remain in effect (meaning this test can be used) for the duration of the COVID-19 declaration under Section 564(b)(1) of the Act, 21 U.S.C. section 360bbb-3(b)(1), unless the authorization is terminated or revoked.  Performed at [facility], [address], [place], [place] [address]   Blood Culture (routine x 2)     Status: None (Preliminary result)   Collection Time: 11/26/20  7:32 PM   Specimen: BLOOD  Result Value Ref Range Status   Specimen Description BLOOD BLOOD LEFT HAND  Final   Special Requests   Final    BOTTLES DRAWN AEROBIC AND ANAEROBIC Blood Culture adequate volume   Culture   Final    NO GROWTH 3 DAYS Performed at [facility], [address] [place], [place] [address]    Report Status PENDING  Incomplete  MRSA PCR Screening     Status: None   Collection Time: 11/27/20  4:12 AM   Specimen: Nasal Mucosa; Nasopharyngeal  Result Value Ref Range Status   MRSA by PCR NEGATIVE NEGATIVE Final    Comment:        The GeneXpert MRSA Assay (FDA approved for NASAL specimens only), is one component of a comprehensive MRSA colonization surveillance program. It is not intended to diagnose MRSA infection nor to guide or monitor treatment for MRSA infections. Performed at [facility], [place] [address] [place], [place] [address]   CSF culture     Status: None (Preliminary result)   Collection Time: 11/28/20 10:31 AM   Specimen: CSF; Cerebrospinal  Fluid  Result Value Ref Range Status   Specimen Description CSF  Final   Special Requests NONE  Final   Gram Stain   Final    WBC PRESENT, PREDOMINANTLY MONONUCLEAR NO ORGANISMS SEEN CYTOSPIN SMEAR    Culture   Final    NO GROWTH < 24 HOURS Performed at [facility] [address] [place], [place] [address]    Report Status PENDING  Incomplete    Janene Madeira, MSN, NP-C [facility] [email or web address] Pager: [phone] Office: [phone] [place]: [phone]

## 2020-11-30 DIAGNOSIS — J018 Other acute sinusitis: Secondary | ICD-10-CM

## 2020-11-30 DIAGNOSIS — R6510 Systemic inflammatory response syndrome (SIRS) of non-infectious origin without acute organ dysfunction: Secondary | ICD-10-CM

## 2020-11-30 DIAGNOSIS — C3491 Malignant neoplasm of unspecified part of right bronchus or lung: Secondary | ICD-10-CM | POA: Diagnosis not present

## 2020-11-30 DIAGNOSIS — G0490 Encephalitis and encephalomyelitis, unspecified: Secondary | ICD-10-CM | POA: Diagnosis not present

## 2020-11-30 DIAGNOSIS — I10 Essential (primary) hypertension: Secondary | ICD-10-CM | POA: Diagnosis not present

## 2020-11-30 LAB — HSV 1/2 PCR, CSF
HSV-1 DNA: NEGATIVE
HSV-1 DNA: NEGATIVE
HSV-2 DNA: NEGATIVE
HSV-2 DNA: NEGATIVE

## 2020-11-30 LAB — GLUCOSE, CAPILLARY
Glucose-Capillary: 107 mg/dL — ABNORMAL HIGH (ref 70–99)
Glucose-Capillary: 79 mg/dL (ref 70–99)
Glucose-Capillary: 160 mg/dL — ABNORMAL HIGH (ref 70–99)
Glucose-Capillary: 103 mg/dL — ABNORMAL HIGH (ref 70–99)

## 2020-11-30 LAB — PATHOLOGIST SMEAR REVIEW

## 2020-11-30 LAB — CBC
HCT: 33.6 % — ABNORMAL LOW (ref 39.0–52.0)
Hemoglobin: 12.1 g/dL — ABNORMAL LOW (ref 13.0–17.0)
MCH: 32.8 pg (ref 26.0–34.0)
MCHC: 36 g/dL (ref 30.0–36.0)
MCV: 91.1 fL (ref 80.0–100.0)
Platelets: 128 10*3/uL — ABNORMAL LOW (ref 150–400)
RBC: 3.69 MIL/uL — ABNORMAL LOW (ref 4.22–5.81)
RDW: 11.9 % (ref 11.5–15.5)
WBC: 5 10*3/uL (ref 4.0–10.5)
nRBC: 0 % (ref 0.0–0.2)

## 2020-11-30 LAB — CYTOLOGY - NON PAP

## 2020-11-30 LAB — COMPREHENSIVE METABOLIC PANEL
ALT: 16 U/L (ref 0–44)
AST: 21 U/L (ref 15–41)
Albumin: 2.8 g/dL — ABNORMAL LOW (ref 3.5–5.0)
Alkaline Phosphatase: 33 U/L — ABNORMAL LOW (ref 38–126)
Anion gap: 5 (ref 5–15)
BUN: 18 mg/dL (ref 8–23)
CO2: 23 mmol/L (ref 22–32)
Calcium: 8.6 mg/dL — ABNORMAL LOW (ref 8.9–10.3)
Chloride: 112 mmol/L — ABNORMAL HIGH (ref 98–111)
Creatinine, Ser: 1.28 mg/dL — ABNORMAL HIGH (ref 0.61–1.24)
GFR, Estimated: 59 mL/min — ABNORMAL LOW (ref >60)
Glucose, Bld: 110 mg/dL — ABNORMAL HIGH (ref 70–99)
Potassium: 3.6 mmol/L (ref 3.5–5.1)
Sodium: 140 mmol/L (ref 135–145)
Total Bilirubin: 1.1 mg/dL (ref 0.3–1.2)
Total Protein: 6.1 g/dL — ABNORMAL LOW (ref 6.5–8.1)

## 2020-11-30 LAB — MAGNESIUM: Magnesium: 1.9 mg/dL (ref 1.7–2.4)

## 2020-11-30 MED ORDER — SODIUM CHLORIDE 0.9 % IV SOLN: INTRAVENOUS | Status: DC

## 2020-11-30 MED ORDER — LEVOTHYROXINE SODIUM 75 MCG PO TABS
175.0000 ug | ORAL_TABLET | Freq: Every day | ORAL | Status: DC
  Administered 2020-12-01 – 2020-12-02 (×2): 175 ug via ORAL
  Filled 2020-11-30 (×2): qty 1

## 2020-11-30 NOTE — Care Management Important Message (Signed)
Important Message  Patient Details  Name: Travis Padilla MRN: [id number] Date of Birth: [birth year]   Medicare Important Message Given:  Yes     [name] 11/30/2020, 2:55 PM

## 2020-11-30 NOTE — Progress Notes (Signed)
PROGRESS NOTE    Travis Padilla  LNL:[id number] DOB: 05-06-1946 DOA: 11/26/2020 PCP: [name], DO   Brief Narrative:  75 year old with history of stage IV cancer on immunotherapy follows with Dr. Earlie Server, hypothyroidism, CAD, anemia brought to the hospital for change in mental status, generalized weakness and fever.  Patient had sore throat/URI 2 days prior to admission and was started on Augmentin.  CT head negative, chest x-ray showed vascular congestion with perihilar opacity UA negative started on broad-spectrum antibiotics. Found to have a tick which was removed and started on doxycycline.  ID consulted.  LP performed under anesthesia on 3/21-no obvious evidence of infection, follow-up culture data.  On broad-spectrum antibiotics and antivirals.  Mentation has improved.  MRI brain with and without contrast shows no acute abnormalities.     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  No obvious source identified therefore cannot confirm sepsis -Cultures-NGTD.  Gram stain-no organisms seen -LP performed 3/21 under general anesthesia-cultures are negative, HSV PCR pending. -ID consulted- abx narrowed to acyclovir- PCR pending -MRI brain with and without contrast- negative -Procalcitonin 0.2 -Covid, flu-negative  Acute metabolic encephalopathy; improved since admission  Alert awake oriented X3 -CT head negative.  Suspect from underlying infection? -MRI brain with and without contrast  unrevealing -No focal neuro deficits.  Continue neurochecks -TSH B12 and ammonia normal.    Tick bite noted, without rash- left axilla -Removed.  Currently on doxycycline x 10 days  Lung cancer, SCC Hx of COPD with Chronic Bronchitis.  -On immunotherapy follows with Dr. Earlie Server  Essential hypertension -Home meds on hold  Hypothyroidism -On levothyroxine    DVT prophylaxis: SCDs Start: 11/27/20 0600  Code Status: Full   Status is: Inpatient  Remains inpatient appropriate because:Inpatient level of care appropriate due to severity of illness   Dispo: The patient is from: Home              Anticipated d/c is to: Home              Patient currently is not medically stable to d/c.  Patient patient is quite confused undergoing evaluation for meningitis and evaluation for his fevers. PT eval   Difficult to place patient No       Subjective: Feels great sitting up in the chair no complaints.  He mentions he gets claustrophobic with MRI therefore requesting Ativan prior to his MRI.  Blood pressure cuff is off attached to the bed therefore inaccurate readings in the computer.  Have notified the nursing staff.  Examination:  General: Appearance:    Obese male in no acute distress     Lungs:    respirations unlabored  Heart:    Normal heart rate. Normal rhythm. No murmurs, rubs, or gallops.   MS:   All extremities are intact.   Neurologic:   Awake, alert, pleasant and cooperative   Objective: Vitals:   11/30/20 0300 11/30/20 0855 11/30/20 0858 11/30/20 1134  BP: 101/76 121/77  110/78  Pulse: 94 (!)  101  91  Resp: (!) 22 17  14   Temp: 98.2 F (36.8 C) 98.3 F (36.8 C)  98 F (36.7 C)  TempSrc: Oral Oral  Oral  SpO2: 92% 96% 96% 94%  Weight:      Height:        Intake/Output Summary (Last 24 hours) at 11/30/2020 1423 Last data filed at 11/30/2020 0856 Gross per 24 hour  Intake 240 ml  Output 841 ml  Net -601 ml   Filed Weights   11/26/20 1825 11/27/20  0140  Weight: 110 kg 111.5 kg     Data Reviewed:   CBC: Recent Labs  Lab 11/26/20 1845 11/27/20 0441 11/27/20 0825 11/28/20 0453 11/29/20 0052 11/30/20 0500  WBC 8.4 7.2  --  7.3 5.3 5.0  NEUTROABS 6.6  --   --   --   --   --   HGB 13.2 11.2*  --  10.9* 10.8* 12.1*  HCT 38.9* 30.9* NOT PERFORMED 29.5* 30.6* 33.6*  MCV 96.0 92.5  --  89.7 91.6 91.1  PLT 105* PLATELET CLUMPS NOTED ON SMEAR, COUNT APPEARS DECREASED  --  94* 110* 240*   Basic Metabolic Panel: Recent Labs  Lab 11/27/20 0441 11/27/20 0612 11/28/20 0453 11/29/20 0052 11/30/20 0500  NA 135 135 138 138 140  K 3.7 4.1 3.3* 3.2* 3.6  CL 105 108 104 107 112*  CO2 22 22 26 25 23   GLUCOSE 195* 192* 128* 127* 110*  BUN 18 17 17 20 18   CREATININE 1.18 1.15 0.92 1.03 1.28*  CALCIUM 8.4* 8.4* 8.4* 8.1* 8.6*  MG  --   --  1.8 1.8 1.9   GFR: Estimated Creatinine Clearance: 68.3 mL/min (A) (by C-G formula based on SCr of 1.28 mg/dL (H)). Liver Function Tests: Recent Labs  Lab 11/26/20 1845 11/27/20 0612 11/28/20 0453 11/29/20 0052 11/30/20 0500  AST 19 33 29 20 21   ALT 15 17 19 16 16   ALKPHOS 39 32* 31* 29* 33*  BILITOT 0.9 1.2 1.6* 1.2 1.1  PROT 7.6 6.1* 6.1* 5.6* 6.1*  ALBUMIN 3.9 3.2* 3.0* 2.6* 2.8*   No results for input(s): LIPASE, AMYLASE in the last 168 hours. Recent Labs  Lab 11/27/20 0825  AMMONIA <9*   Coagulation Profile: Recent Labs  Lab 11/26/20 1845 11/27/20 0612  INR 1.2 1.2   Cardiac Enzymes: No results for input(s): CKTOTAL, CKMB, CKMBINDEX, TROPONINI in the last 168 hours. BNP (last 3 results) No results for input(s): PROBNP in the last 8760 hours. HbA1C: No results for input(s): HGBA1C in the last 72 hours. CBG: Recent Labs  Lab 11/28/20 1120 11/28/20 1737 11/29/20 0528 11/30/20 0525 11/30/20 1135  GLUCAP 95 132* 83 107* 79   Lipid Profile: No results for input(s): CHOL, HDL, LDLCALC, TRIG, CHOLHDL, LDLDIRECT in the last 72 hours. Thyroid Function Tests: No  results for input(s): TSH, T4TOTAL, FREET4, T3FREE, THYROIDAB in the last 72 hours. Anemia Panel: No results for input(s): VITAMINB12, FOLATE, FERRITIN, TIBC, IRON, RETICCTPCT in the last 72 hours. Sepsis Labs: Recent Labs  Lab 11/26/20 1849 11/26/20 2020 11/27/20 0441 11/28/20 0453  PROCALCITON  --  0.20 0.16 0.11  LATICACIDVEN 1.6 1.5  --   --     Recent Results (from the past 240 hour(s))  Urine culture     Status: None   Collection Time: 11/26/20  6:28 PM   Specimen: In/Out Cath Urine  Result Value Ref Range Status   Specimen Description   Final    IN/OUT  CATH URINE Performed at [facility], [address] [place], [place] [address]    Special Requests   Final    NONE Performed at [facility], [address] [place], [place] [address]    Culture   Final    NO GROWTH Performed at [facility], [place] [address] [place], [place] [address]    Report Status 11/28/2020 FINAL  Final  Blood Culture (routine x 2)     Status: None (Preliminary result)   Collection Time: 11/26/20  6:45 PM   Specimen: BLOOD  Result Value Ref Range Status   Specimen Description BLOOD RIGHT ANTECUBITAL  Final   Special Requests   Final    BOTTLES DRAWN AEROBIC AND ANAEROBIC Blood Culture adequate volume   Culture   Final    NO GROWTH 4 DAYS Performed at [facility], [address], [place], [place] [address]    Report Status PENDING  Incomplete  Resp Panel by RT-PCR (Flu A&B, Covid) Nasopharyngeal Swab     Status: None   Collection Time: 11/26/20  7:25 PM   Specimen: Nasopharyngeal Swab; Nasopharyngeal(NP) swabs in vial transport medium  Result Value Ref Range Status   SARS Coronavirus 2 by RT PCR NEGATIVE NEGATIVE Final    Comment: (NOTE) SARS-CoV-2 target nucleic acids are NOT DETECTED.  The SARS-CoV-2 RNA is generally detectable in upper respiratory specimens during the acute phase of infection. The lowest concentration of SARS-CoV-2 viral copies this assay can detect is 138  copies/mL. A negative result does not preclude SARS-Cov-2 infection and should not be used as the sole basis for treatment or other patient management decisions. A negative result may occur with  improper specimen collection/handling, submission of specimen other than nasopharyngeal swab, presence of viral mutation(s) within the areas targeted by this assay, and inadequate number of viral copies(<138 copies/mL). A negative result must be combined with clinical observations, patient history, and epidemiological information. The expected result is Negative.  Fact Sheet for Patients:  EntrepreneurPulse.com.au  Fact Sheet for Healthcare Providers:  IncredibleEmployment.be  This test is no t yet approved or cleared by the [place] FDA and  has been authorized for detection and/or diagnosis of SARS-CoV-2 by FDA under an Emergency Use Authorization (EUA). This EUA will remain  in effect (meaning this test can be used) for the duration of the COVID-19 declaration under Section 564(b)(1) of the Act, 21 U.S.C.section 360bbb-3(b)(1), unless the authorization is terminated  or revoked sooner.       Influenza A by PCR NEGATIVE NEGATIVE Final   Influenza B by PCR NEGATIVE NEGATIVE Final    Comment: (NOTE) The Xpert Xpress SARS-CoV-2/FLU/RSV plus assay is intended as an aid in the diagnosis of influenza from Nasopharyngeal swab specimens and should not be used as a sole basis for treatment. Nasal washings and aspirates are unacceptable for Xpert Xpress SARS-CoV-2/FLU/RSV testing.  Fact Sheet for Patients: EntrepreneurPulse.com.au  Fact Sheet for Healthcare Providers: IncredibleEmployment.be  This test is not yet approved or cleared by the [place] FDA and has been authorized for detection and/or diagnosis of SARS-CoV-2 by FDA under an Emergency Use Authorization (EUA). This EUA will remain in effect (meaning  this test can be used) for the duration of the COVID-19 declaration under Section 564(b)(1) of the Act, 21 U.S.C. section 360bbb-3(b)(1), unless the authorization is terminated or revoked.  Performed at [facility], [address] [place], [place] [address]   Blood Culture (routine x 2)     Status: None (Preliminary  result)   Collection Time: 11/26/20  7:32 PM   Specimen: BLOOD  Result Value Ref Range Status   Specimen Description BLOOD BLOOD LEFT HAND  Final   Special Requests   Final    BOTTLES DRAWN AEROBIC AND ANAEROBIC Blood Culture adequate volume   Culture   Final    NO GROWTH 4 DAYS Performed at [facility], [address], [place], [place] [address]    Report Status PENDING  Incomplete  MRSA PCR Screening     Status: None   Collection Time: 11/27/20  4:12 AM   Specimen: Nasal Mucosa; Nasopharyngeal  Result Value Ref Range Status   MRSA by PCR NEGATIVE NEGATIVE Final    Comment:        The GeneXpert MRSA Assay (FDA approved for NASAL specimens only), is one component of a comprehensive MRSA colonization surveillance program. It is not intended to diagnose MRSA infection nor to guide or monitor treatment for MRSA infections. Performed at [facility], [place] [address] [place], [place] [address]   CSF culture     Status: None (Preliminary result)   Collection Time: 11/28/20 10:31 AM   Specimen: CSF; Cerebrospinal Fluid  Result Value Ref Range Status   Specimen Description CSF  Final   Special Requests NONE  Final   Gram Stain   Final    WBC PRESENT, PREDOMINANTLY MONONUCLEAR NO ORGANISMS SEEN CYTOSPIN SMEAR    Culture   Final    NO GROWTH 2 DAYS Performed at [facility], [place] [address] [place], [place] [address]    Report Status PENDING  Incomplete         Radiology Studies: MR BRAIN W WO CONTRAST  Result Date: 11/29/2020 CLINICAL DATA:  Stroke, follow-up. Rule out infection. History of stage IV squamous cell carcinoma of the lung.  EXAM: MRI HEAD WITHOUT AND WITH CONTRAST TECHNIQUE: Multiplanar, multiecho pulse sequences of the brain and surrounding structures were obtained without and with intravenous contrast. CONTRAST:  74mL GADAVIST GADOBUTROL 1 MMOL/ML IV SOLN COMPARISON:  Head CT November 26, 2020 FINDINGS: The study is partially degraded by motion, particularly post-contrast images. Brain: No acute infarction, hemorrhage, extra-axial collection or mass lesion. Few scattered foci of T2 hyperintensity are seen within the white matter of the cerebral hemispheres, nonspecific, most likely related to mild chronic microangiopathic changes. 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 Prominence of the supratentorial ventricles with relative preservation of the high convexity sulci which may be seen in the setting of normal pressure hydrocephalus in the appropriate clinical scenario. Postcontrast images are significantly degraded by motion. No gross abnormal contrast enhancement. Vascular: Normal flow voids. Skull and upper cervical spine: Normal marrow signal. Sinuses/Orbits: Mild mucosal thickening of the sphenoid sinuses. Bilateral lens surgery. IMPRESSION: 1. Motion degraded study. 2. No acute intracranial abnormality. 3. Prominence of the supratentorial ventricles with relative preservation of the high convexity sulci which may be seen in the setting of normal pressure hydrocephalus in the appropriate clinical scenario. 4. Mild chronic microangiopathic changes. 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 Electronically Signed   By: Pedro Earls M.D.   On: 11/29/2020 13:19        Scheduled Meds: . Chlorhexidine Gluconate Cloth  6 each Topical Daily  . fluticasone furoate-vilanterol  1 puff Inhalation Daily   And  . umeclidinium bromide  1 puff Inhalation Daily  . ipratropium-albuterol  3 mL Nebulization  BID  . [START ON 12/01/2020]  levothyroxine  175 mcg Oral Q0600  . mouth rinse  15 mL Mouth Rinse BID  . senna-docusate  2 tablet Oral QHS  . sodium chloride flush  10-40 mL Intracatheter Q12H   Continuous Infusions: . acyclovir 935 mg (11/30/20 0658)  . doxycycline (VIBRAMYCIN) IV 100 mg (11/30/20 1302)     LOS: 4 days   Time spent= 35 mins    Geradine Girt, DO [facility]  If 7PM-7AM, please contact night-coverage  11/30/2020, 2:23 PM

## 2020-11-30 NOTE — Progress Notes (Signed)
[facility]  Date of Admission:  11/26/2020      Total days of antibiotics 4  Doxycycline 3/20 >> current   Acyclovir 3/19 >> current   Vancomycin 3/19 >> 3/22  Ampicillin 3/19 >> 3/22  Cefepime 3/19 >> 3/23           ASSESSMENT: Travis Padilla is a 75 y.o. 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   Continues to do better with resolution of encephalitis / encephalopathy. No cause identified at this time. Few case reports document possible auto-immune encephalitis with pembrolizumab - neurology and onc felt that with normal protein/glucose. Agree that his CNS indices are odd. Unlikely to have bacterial meningitis given 1% neutrophils and normal glucose/protein, even if partially treated would expect these to at least be abnormal to a degree. ?viral vs tick borne.   Serum creatinine slightly elevated today 1.03 >> 1.28 on acyclovir - urine production decreased a bit and not taking much in PO. Would recommend we consider some IVF today while we continue acyclovir IV to prevent crystal nephropathy.    PLAN: 1. Will continue doxycycline to complete 10 day course  2. Stop cefepime  3. Would add maintenance IVF today to increase urine output and prevent crystal formation 4. Follow serologies and remaining CSF studies  5. 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  Chlorhexidine Gluconate Cloth  6 each Topical Daily  . fluticasone furoate-vilanterol  1 puff Inhalation Daily   And  . umeclidinium bromide  1 puff Inhalation Daily  . ipratropium-albuterol  3 mL Nebulization BID  . [START ON 12/01/2020] levothyroxine  175 mcg Oral Q0600  . mouth rinse  15 mL Mouth Rinse BID  . senna-docusate  2 tablet Oral QHS  . sodium chloride flush  10-40 mL Intracatheter Q12H    SUBJECTIVE: Feeling well - no new complaints today.  Is concerned about needing to work on physical mobility since he has 'been stagnant so long.'     Review of Systems: Review of Systems  Constitutional: Negative for chills and fever.  Respiratory: Negative for cough and shortness of breath.   Musculoskeletal: Negative for back pain and neck pain.  Skin: Negative for rash.  Neurological: Negative for dizziness, speech change, focal weakness, weakness and headaches.    Allergies  Allergen Reactions  . Pravastatin     Numbness in feet   . Iodine Hives and Rash    CT scan contrast caused rash and itching     OBJECTIVE: Vitals:   11/30/20 0300 11/30/20 0855 11/30/20 0858 11/30/20 1134  BP: 101/76 121/77  110/78  Pulse: 94 (!) 101  91  Resp: (!) 22 (!) 26  14  Temp: 98.2 F (36.8 C) 98.3 F (36.8 C)  98 F (36.7 C)  TempSrc: Oral Oral  Oral  SpO2:  92% 96% 96% 94%  Weight:      Height:       Body mass index is 31.56 kg/m.  Physical Exam Constitutional:      Appearance: Normal appearance. He is not ill-appearing.  HENT:     Mouth/Throat:     Mouth: No oral lesions.     Dentition: Normal dentition. No dental caries.  Eyes:     General: No scleral icterus. Cardiovascular:     Rate and Rhythm: Normal rate and regular rhythm.     Heart sounds: Normal heart sounds.  Pulmonary:     Effort: Pulmonary effort is normal.     Breath sounds: Normal breath sounds.  Abdominal:     General: There is no distension.     Palpations: Abdomen is soft.     Tenderness: There is  no abdominal tenderness.  Lymphadenopathy:     Cervical: No cervical adenopathy.  Skin:    General: Skin is warm and dry.     Findings: No rash.  Neurological:     Mental Status: He is alert and oriented to person, place, and time.     Coordination: Coordination normal.  Psychiatric:        Mood and Affect: Mood normal.        Behavior: Behavior normal.     Lab Results Lab Results  Component Value Date   WBC 5.0 11/30/2020   HGB 12.1 (L) 11/30/2020   HCT 33.6 (L) 11/30/2020   MCV 91.1 11/30/2020   PLT 128 (L) 11/30/2020    Lab Results  Component Value Date   CREATININE 1.28 (H) 11/30/2020   BUN 18 11/30/2020   NA 140 11/30/2020   K 3.6 11/30/2020   CL 112 (H) 11/30/2020   CO2 23 11/30/2020    Lab Results  Component Value Date   ALT 16 11/30/2020   AST 21 11/30/2020   ALKPHOS 33 (L) 11/30/2020   BILITOT 1.1 11/30/2020     Microbiology: Recent Results (from the past 240 hour(s))  Urine culture     Status: None   Collection Time: 11/26/20  6:28 PM   Specimen: In/Out Cath Urine  Result Value Ref Range Status   Specimen Description   Final    IN/OUT CATH URINE Performed at [facility], [address], [place], [place] [address]    Special Requests   Final    NONE Performed at [facility], [address] [place], [place] [address]    Culture   Final    NO GROWTH Performed at [facility], [place] [address], [place], [place] [address]    Report Status 11/28/2020 FINAL  Final  Blood Culture (routine x 2)     Status: None (Preliminary result)   Collection Time: 11/26/20  6:45 PM   Specimen: BLOOD  Result Value Ref Range Status   Specimen Description BLOOD RIGHT ANTECUBITAL  Final   Special Requests   Final    BOTTLES DRAWN AEROBIC AND ANAEROBIC Blood Culture adequate volume   Culture   Final    NO GROWTH 4 DAYS Performed at [facility], [address], [place], [place] [address]    Report Status PENDING  Incomplete  Resp Panel by RT-PCR (Flu A&B,  Covid) Nasopharyngeal Swab     Status: None   Collection Time: 11/26/20  7:25 PM   Specimen: Nasopharyngeal Swab; Nasopharyngeal(NP) swabs in vial transport medium  Result Value Ref Range Status   SARS Coronavirus 2 by RT PCR NEGATIVE  NEGATIVE Final    Comment: (NOTE) SARS-CoV-2 target nucleic acids are NOT DETECTED.  The SARS-CoV-2 RNA is generally detectable in upper respiratory specimens during the acute phase of infection. The lowest concentration of SARS-CoV-2 viral copies this assay can detect is 138 copies/mL. A negative result does not preclude SARS-Cov-2 infection and should not be used as the sole basis for treatment or other patient management decisions. A negative result may occur with  improper specimen collection/handling, submission of specimen other than nasopharyngeal swab, presence of viral mutation(s) within the areas targeted by this assay, and inadequate number of viral copies(<138 copies/mL). A negative result must be combined with clinical observations, patient history, and epidemiological information. The expected result is Negative.  Fact Sheet for Patients:  EntrepreneurPulse.com.au  Fact Sheet for Healthcare Providers:  IncredibleEmployment.be  This test is no t yet approved or cleared by the [place] FDA and  has been authorized for detection and/or diagnosis of SARS-CoV-2 by FDA under an Emergency Use Authorization (EUA). This EUA will remain  in effect (meaning this test can be used) for the duration of the COVID-19 declaration under Section 564(b)(1) of the Act, 21 U.S.C.section 360bbb-3(b)(1), unless the authorization is terminated  or revoked sooner.       Influenza A by PCR NEGATIVE NEGATIVE Final   Influenza B by PCR NEGATIVE NEGATIVE Final    Comment: (NOTE) The Xpert Xpress SARS-CoV-2/FLU/RSV plus assay is intended as an aid in the diagnosis of influenza from Nasopharyngeal swab specimens and should  not be used as a sole basis for treatment. Nasal washings and aspirates are unacceptable for Xpert Xpress SARS-CoV-2/FLU/RSV testing.  Fact Sheet for Patients: EntrepreneurPulse.com.au  Fact Sheet for Healthcare Providers: IncredibleEmployment.be  This test is not yet approved or cleared by the [place] FDA and has been authorized for detection and/or diagnosis of SARS-CoV-2 by FDA under an Emergency Use Authorization (EUA). This EUA will remain in effect (meaning this test can be used) for the duration of the COVID-19 declaration under Section 564(b)(1) of the Act, 21 U.S.C. section 360bbb-3(b)(1), unless the authorization is terminated or revoked.  Performed at [facility], [address], [place], [place] [address]   Blood Culture (routine x 2)     Status: None (Preliminary result)   Collection Time: 11/26/20  7:32 PM   Specimen: BLOOD  Result Value Ref Range Status   Specimen Description BLOOD BLOOD LEFT HAND  Final   Special Requests   Final    BOTTLES DRAWN AEROBIC AND ANAEROBIC Blood Culture adequate volume   Culture   Final    NO GROWTH 4 DAYS Performed at [facility], [address], [place], [place] [address]    Report Status PENDING  Incomplete  MRSA PCR Screening     Status: None   Collection Time: 11/27/20  4:12 AM   Specimen: Nasal Mucosa; Nasopharyngeal  Result Value Ref Range Status   MRSA by PCR NEGATIVE NEGATIVE Final    Comment:        The GeneXpert MRSA Assay (FDA approved for NASAL specimens only), is one component of a comprehensive MRSA colonization surveillance program. It is not intended to diagnose MRSA infection nor to guide or monitor treatment for MRSA infections. Performed at [facility], [place] [address], [place], [place] [address]   CSF culture     Status: None (Preliminary result)   Collection Time: 11/28/20 10:31 AM   Specimen: CSF; Cerebrospinal Fluid  Result Value Ref Range Status  Specimen Description CSF  Final   Special Requests NONE  Final   Gram Stain   Final    WBC PRESENT, PREDOMINANTLY MONONUCLEAR NO ORGANISMS SEEN CYTOSPIN SMEAR    Culture   Final    NO GROWTH 2 DAYS Performed at [facility], [address] [place], [place] [address]    Report Status PENDING  Incomplete    Janene Madeira, MSN, NP-C [facility] [email or web address] Pager: [phone] Office: [phone] [place]: [phone]

## 2020-12-01 DIAGNOSIS — C3491 Malignant neoplasm of unspecified part of right bronchus or lung: Secondary | ICD-10-CM | POA: Diagnosis not present

## 2020-12-01 DIAGNOSIS — G0490 Encephalitis and encephalomyelitis, unspecified: Secondary | ICD-10-CM | POA: Diagnosis not present

## 2020-12-01 LAB — CSF CULTURE W GRAM STAIN: Culture: NO GROWTH

## 2020-12-01 LAB — CULTURE, BLOOD (ROUTINE X 2)
Culture: NO GROWTH
Culture: NO GROWTH
Special Requests: ADEQUATE
Special Requests: ADEQUATE

## 2020-12-01 LAB — MISC LABCORP TEST (SEND OUT)
LabCorp test name: 87798
Labcorp test code: 138412

## 2020-12-01 LAB — CBC
HCT: 30.4 % — ABNORMAL LOW (ref 39.0–52.0)
Hemoglobin: 11.1 g/dL — ABNORMAL LOW (ref 13.0–17.0)
MCH: 33.4 pg (ref 26.0–34.0)
MCHC: 36.5 g/dL — ABNORMAL HIGH (ref 30.0–36.0)
MCV: 91.6 fL (ref 80.0–100.0)
Platelets: 143 10*3/uL — ABNORMAL LOW (ref 150–400)
RBC: 3.32 MIL/uL — ABNORMAL LOW (ref 4.22–5.81)
RDW: 11.9 % (ref 11.5–15.5)
WBC: 5.1 10*3/uL (ref 4.0–10.5)
nRBC: 0 % (ref 0.0–0.2)

## 2020-12-01 LAB — GLUCOSE, CAPILLARY
Glucose-Capillary: 104 mg/dL — ABNORMAL HIGH (ref 70–99)
Glucose-Capillary: 124 mg/dL — ABNORMAL HIGH (ref 70–99)
Glucose-Capillary: 198 mg/dL — ABNORMAL HIGH (ref 70–99)

## 2020-12-01 LAB — MAGNESIUM: Magnesium: 1.7 mg/dL (ref 1.7–2.4)

## 2020-12-01 MED ORDER — MAGNESIUM SULFATE 2 GM/50ML IV SOLN
2.0000 g | Freq: Once | INTRAVENOUS | Status: AC
  Administered 2020-12-01: 2 g via INTRAVENOUS
  Filled 2020-12-01: qty 50

## 2020-12-01 NOTE — Progress Notes (Signed)
Travis Padilla for Infectious Disease  Date of Admission:  11/26/2020      Total days of antibiotics 5  Doxycycline 3/20 >> current   Acyclovir 3/19 >> 3/24  Vancomycin 3/19 >> 3/22  Ampicillin 3/19 >> 3/22  Cefepime 3/19 >> 3/23           ASSESSMENT: Travis Padilla is a 75 y.o. 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   Remains afebrile and at baseline neurologically - we reviewed available results that we have obtained with blood, CSF studies along with MRI imaging. HSV PCR returned negative from [place]. Will plan to stop acyclovir now and continue 10-day course of doxycycline   I have arranged follow up in ID clinic to review pending tests and consider repeating some of the tick borne antibody testing then for further confirmation if needed.   D/C OK from ID standpoint. Please call with any questions.     PLAN: 1. Will continue doxycycline to complete 10 day course  2. Stop acyclovir 3. 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 Chlorhexidine Gluconate Cloth  6 each Topical Daily  . fluticasone furoate-vilanterol  1 puff Inhalation Daily   And  . umeclidinium bromide  1 puff Inhalation Daily  . ipratropium-albuterol  3 mL Nebulization BID  . levothyroxine  175 mcg Oral Q0600  . mouth rinse  15 mL Mouth Rinse BID  . senna-docusate  2 tablet Oral QHS  . sodium chloride flush  10-40 mL Intracatheter Q12H    SUBJECTIVE: Feeling well  - no new complaints today.  Did well walking around in the room. No balance issues. Felt strong and steady. Getting cleaned up today.  Wondering about the discharge plan.     Review of Systems: Review of Systems  Constitutional: Negative for chills and fever.  Respiratory: Negative for cough and shortness of breath.   Musculoskeletal: Negative for back pain and neck pain.  Skin: Negative for rash.  Neurological: Negative for dizziness, speech change, focal weakness, weakness and headaches.    Allergies  Allergen Reactions  . Pravastatin     Numbness in feet   . Iodine Hives and Rash    CT scan contrast caused rash and itching     OBJECTIVE: Vitals:   12/01/20 0348 12/01/20 0717 12/01/20 0842 12/01/20 0942  BP: (!) 153/97 126/82    Pulse: 83 90 82 100  Resp: 18 15 16    Temp: 97.8 F (36.6 C) 98 F (36.7 C)    TempSrc: Oral Oral    SpO2: 92% 95% 98% 93%  Weight:      Height:       Body mass index is 31.56 kg/m.  Physical Exam Vitals reviewed.  Constitutional:      Appearance: He is well-developed.     Comments: Seated comfortably in recliner finishing up a bath  HENT:     Mouth/Throat:  Dentition: Normal dentition. No dental abscesses.  Cardiovascular:     Rate and Rhythm: Normal rate and regular rhythm.     Heart sounds: Normal heart sounds.  Pulmonary:     Effort: Pulmonary effort is normal.     Breath sounds: Normal breath sounds.  Abdominal:     General: There is no distension.     Palpations: Abdomen is soft.     Tenderness: There is no abdominal tenderness.  Lymphadenopathy:     Cervical: No cervical adenopathy.  Skin:    General: Skin is warm and dry.     Capillary Refill: Capillary refill takes less than 2 seconds.     Findings: No rash.  Neurological:     General: No focal deficit present.     Mental Status: He is alert and oriented to person, place, and time. Mental status is at baseline.     Motor: No weakness.     Coordination:  Coordination normal.     Gait: Gait normal.  Psychiatric:        Judgment: Judgment normal.     Comments: In good spirits today and engaged in care discussion.      Lab Results Lab Results  Component Value Date   WBC 5.1 12/01/2020   HGB 11.1 (L) 12/01/2020   HCT 30.4 (L) 12/01/2020   MCV 91.6 12/01/2020   PLT 143 (L) 12/01/2020    Lab Results  Component Value Date   CREATININE 1.28 (H) 11/30/2020   BUN 18 11/30/2020   NA 140 11/30/2020   K 3.6 11/30/2020   CL 112 (H) 11/30/2020   CO2 23 11/30/2020    Lab Results  Component Value Date   ALT 16 11/30/2020   AST 21 11/30/2020   ALKPHOS 33 (L) 11/30/2020   BILITOT 1.1 11/30/2020     Microbiology: Recent Results (from the past 240 hour(s))  Urine culture     Status: None   Collection Time: 11/26/20  6:28 PM   Specimen: In/Out Cath Urine  Result Value Ref Range Status   Specimen Description   Final    IN/OUT CATH URINE Performed at [facility], [address], [place], [place] [address]    Special Requests   Final    NONE Performed at [facility], [address], [place], [place] [address]    Culture   Final    NO GROWTH Performed at [facility], [place] [address] [place], [place] [address]    Report Status 11/28/2020 FINAL  Final  Blood Culture (routine x 2)     Status: None   Collection Time: 11/26/20  6:45 PM   Specimen: BLOOD  Result Value Ref Range Status   Specimen Description BLOOD RIGHT ANTECUBITAL  Final   Special Requests   Final    BOTTLES DRAWN AEROBIC AND ANAEROBIC Blood Culture adequate volume   Culture   Final    NO GROWTH 5 DAYS Performed at [facility], [address] [place], [place] [address]    Report Status 12/01/2020 FINAL  Final  Resp Panel by RT-PCR (Flu A&B, Covid) Nasopharyngeal Swab     Status: None   Collection Time: 11/26/20  7:25 PM   Specimen: Nasopharyngeal Swab; Nasopharyngeal(NP) swabs in vial transport medium  Result Value Ref Range Status   SARS Coronavirus 2 by  RT PCR NEGATIVE NEGATIVE Final    Comment: (NOTE) SARS-CoV-2 target nucleic acids are NOT DETECTED.  The SARS-CoV-2 RNA is generally detectable in upper respiratory specimens  during the acute phase of infection. The lowest concentration of SARS-CoV-2 viral copies this assay can detect is 138 copies/mL. A negative result does not preclude SARS-Cov-2 infection and should not be used as the sole basis for treatment or other patient management decisions. A negative result may occur with  improper specimen collection/handling, submission of specimen other than nasopharyngeal swab, presence of viral mutation(s) within the areas targeted by this assay, and inadequate number of viral copies(<138 copies/mL). A negative result must be combined with clinical observations, patient history, and epidemiological information. The expected result is Negative.  Fact Sheet for Patients:  EntrepreneurPulse.com.au  Fact Sheet for Healthcare Providers:  IncredibleEmployment.be  This test is no t yet approved or cleared by the [place] FDA and  has been authorized for detection and/or diagnosis of SARS-CoV-2 by FDA under an Emergency Use Authorization (EUA). This EUA will remain  in effect (meaning this test can be used) for the duration of the COVID-19 declaration under Section 564(b)(1) of the Act, 21 U.S.C.section 360bbb-3(b)(1), unless the authorization is terminated  or revoked sooner.       Influenza A by PCR NEGATIVE NEGATIVE Final   Influenza B by PCR NEGATIVE NEGATIVE Final    Comment: (NOTE) The Xpert Xpress SARS-CoV-2/FLU/RSV plus assay is intended as an aid in the diagnosis of influenza from Nasopharyngeal swab specimens and should not be used as a sole basis for treatment. Nasal washings and aspirates are unacceptable for Xpert Xpress SARS-CoV-2/FLU/RSV testing.  Fact Sheet for Patients: EntrepreneurPulse.com.au  Fact Sheet  for Healthcare Providers: IncredibleEmployment.be  This test is not yet approved or cleared by the [place] FDA and has been authorized for detection and/or diagnosis of SARS-CoV-2 by FDA under an Emergency Use Authorization (EUA). This EUA will remain in effect (meaning this test can be used) for the duration of the COVID-19 declaration under Section 564(b)(1) of the Act, 21 U.S.C. section 360bbb-3(b)(1), unless the authorization is terminated or revoked.  Performed at [facility], [address], [place], [place] [address]   Blood Culture (routine x 2)     Status: None   Collection Time: 11/26/20  7:32 PM   Specimen: BLOOD  Result Value Ref Range Status   Specimen Description BLOOD BLOOD LEFT HAND  Final   Special Requests   Final    BOTTLES DRAWN AEROBIC AND ANAEROBIC Blood Culture adequate volume   Culture   Final    NO GROWTH 5 DAYS Performed at [facility], [address] [place], [place] [address]    Report Status 12/01/2020 FINAL  Final  MRSA PCR Screening     Status: None   Collection Time: 11/27/20  4:12 AM   Specimen: Nasal Mucosa; Nasopharyngeal  Result Value Ref Range Status   MRSA by PCR NEGATIVE NEGATIVE Final    Comment:        The GeneXpert MRSA Assay (FDA approved for NASAL specimens only), is one component of a comprehensive MRSA colonization surveillance program. It is not intended to diagnose MRSA infection nor to guide or monitor treatment for MRSA infections. Performed at [facility], [place] [address], [place], [place] [address]   CSF culture     Status: None   Collection Time: 11/28/20 10:31 AM   Specimen: CSF; Cerebrospinal Fluid  Result Value Ref Range Status   Specimen Description CSF  Final   Special Requests NONE  Final   Gram Stain   Final    WBC PRESENT, PREDOMINANTLY MONONUCLEAR NO ORGANISMS SEEN  CYTOSPIN SMEAR    Culture   Final    NO GROWTH 3 DAYS Performed at [facility], [place] [address] [place], [place] [address]    Report Status 12/01/2020 FINAL  Final    Janene Madeira, MSN, NP-C [facility] for Infectious Disease [email or web address] Pager: [phone] Office: [phone] [place]: [phone]

## 2020-12-01 NOTE — Evaluation (Signed)
Physical Therapy Evaluation Patient Details Name: Travis Padilla MRN: [id number] DOB: 1945/12/01 Today's Date: 12/01/2020   History of Present Illness  75 yo admitted 3/19 with AMS with noted tick bite. Pt with negative lumbar puncture 3/21 and negative MRI. PMHx: stage IV CA, hypothyroidism, CAD, anemia  Clinical Impression  Pt very pleasant sitting up in chair on arrival. Pt A&O x4 and able to converse appropriately. Pt with slightly unsteady gait with recommendation for initial progression from RW to cane prior to return to independence. Pt also with 2nd floor bedroom and will need to complete flight of stairs prior to return home. Pt with decreased activity tolerance and balance who will benefit from acute therapy to maximize independence and function to return to PLOF.      Follow Up Recommendations No PT follow up    Equipment Recommendations  None recommended by PT    Recommendations for Other Services       Precautions / Restrictions Precautions Precautions: Fall Restrictions Weight Bearing Restrictions: No      Mobility  Bed Mobility               General bed mobility comments: in chair on arrival and end of session    Transfers Overall transfer level: Modified independent                  Ambulation/Gait Ambulation/Gait assistance: Supervision Gait Distance (Feet): 400 Feet Assistive device: Rolling walker (2 wheeled) Gait Pattern/deviations: Step-through pattern   Gait velocity interpretation: >2.62 ft/sec, indicative of community ambulatory General Gait Details: pt with initial gait without RW with slightly unsteady gait and transition to RW for walking long hall distance. pt with cues for stepping into RW with gait and for directional cues. Pt able to walk last 20' without Rw with improved gait from initial trial but still one partial LOB and encouraged RW acutely until balance and activity tolerance progressed  Stairs             Wheelchair Mobility    Modified Rankin (Stroke Patients Only)       Balance Overall balance assessment: Mild deficits observed, not formally tested                                           Pertinent Vitals/Pain Pain Assessment: No/denies pain    Home Living Family/patient expects to be discharged to:: Private residence Living Arrangements: Spouse/significant other Available Help at Discharge: Family;Available 24 hours/day Type of Home: House Home Access: Stairs to enter   [employer] of Steps: 3 Home Layout: Two level;Bed/bath upstairs Home Equipment: Cane - single point;Walker - 2 wheels;Bedside commode      Prior Function Level of Independence: Independent               Hand Dominance        Extremity/Trunk Assessment   Upper Extremity Assessment Upper Extremity Assessment: Overall WFL for tasks assessed    Lower Extremity Assessment Lower Extremity Assessment: Overall WFL for tasks assessed    Cervical / Trunk Assessment Cervical / Trunk Assessment: Normal  Communication   Communication: No difficulties  Cognition Arousal/Alertness: Awake/alert Behavior During Therapy: WFL for tasks assessed/performed Overall Cognitive Status: Within Functional Limits for tasks assessed  General Comments      Exercises     Assessment/Plan    PT Assessment Patient needs continued PT services  PT Problem List Decreased mobility;Decreased activity tolerance;Decreased balance;Decreased knowledge of use of DME       PT Treatment Interventions Gait training;Stair training;Functional mobility training;Therapeutic activities;Patient/family education;Balance training;Therapeutic exercise;DME instruction    PT Goals (Current goals can be found in the Care Plan section)  Acute Rehab PT Goals Patient Stated Goal: return to gardening PT Goal Formulation: With patient Time For  Goal Achievement: 12/15/20 Potential to Achieve Goals: Good    Frequency Min 3X/week   Barriers to discharge        Co-evaluation               AM-PAC PT "6 Clicks" Mobility  Outcome Measure Help needed turning from your back to your side while in a flat bed without using bedrails?: None Help needed moving from lying on your back to sitting on the side of a flat bed without using bedrails?: None Help needed moving to and from a bed to a chair (including a wheelchair)?: None Help needed standing up from a chair using your arms (e.g., wheelchair or bedside chair)?: A Little Help needed to walk in hospital room?: A Little Help needed climbing 3-5 steps with a railing? : A Little 6 Click Score: 21    End of Session   Activity Tolerance: Patient tolerated treatment well Patient left: in chair;with call bell/phone within reach;with chair alarm set Nurse Communication: Mobility status PT Visit Diagnosis: Other abnormalities of gait and mobility (R26.89)    Time: 5188-4166 PT Time Calculation (min) (ACUTE ONLY): 17 min   Charges:   PT Evaluation $PT Eval Moderate Complexity: 1 Mod          Estephanie Hubbs P, PT Acute Rehabilitation Services Pager: [phone] Office: [place] 12/01/2020, 10:41 AM

## 2020-12-01 NOTE — Plan of Care (Signed)

## 2020-12-01 NOTE — Progress Notes (Signed)
PROGRESS NOTE    Travis Padilla  GEX:[id number] DOB: 03-10-1946 DOA: 11/26/2020 PCP: [name], DO   Brief Narrative:  75 year old with history of stage IV cancer on immunotherapy follows with Dr. Earlie Server, hypothyroidism, CAD, anemia brought to the hospital for change in mental status, generalized weakness and fever.  Patient had sore throat/URI 2 days prior to admission and was started on Augmentin.  CT head negative, chest x-ray showed vascular congestion with perihilar opacity UA negative started on broad-spectrum antibiotics. Found to have a tick which was removed and started on doxycycline.  ID consulted.  LP performed under anesthesia on 3/21-no obvious evidence of infection, follow-up culture data.  On broad-spectrum antibiotics and antivirals.  Mentation has improved.  MRI brain with and without contrast shows no acute abnormalities.     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  No obvious source identified therefore cannot confirm sepsis -Cultures-NGTD.  Gram stain-no organisms seen -LP performed 3/21 under general anesthesia-cultures are negative, HSV PCR negative -ID consulted- doxy x 10 days -MRI brain with and without contrast- negative -Covid, flu-negative  Acute metabolic encephalopathy; improved since admission  Alert awake oriented X3 -CT head negative.  Suspect from underlying infection?- tick borne illness -MRI brain with and without contrast  unrevealing -No focal neuro deficits.  Continue neurochecks -TSH B12 and ammonia normal.    Tick bite noted, without rash- left axilla -Removed.  Currently on doxycycline x 10 days  Lung cancer, SCC Hx of COPD with Chronic Bronchitis.  -On immunotherapy follows with Dr. Earlie Server  Essential hypertension -Home meds on hold  Hypothyroidism -On levothyroxine  Hypomagnesemia -replete    DVT prophylaxis: SCDs Start: 11/27/20 0600  Code Status: Full   Status is: Inpatient  Remains inpatient appropriate because:Inpatient level of care appropriate due to severity of illness   Dispo: The patient is from: Home              Anticipated d/c is to: Home              Patient currently is not medically stable to d/c.  Home in AM after PT evals    Difficult to place patient No       Subjective: Excited to get up and walk  Examination:  General: Appearance:    Obese male in no acute distress     Lungs:     respirations unlabored  Heart:    Tachycardic. Normal rhythm. No murmurs, rubs, or gallops.   MS:   All extremities are intact.   Neurologic:   Awake, alert, oriented x 3. No apparent focal neurological           defect.        Objective: Vitals:   12/01/20 0348 12/01/20 0717 12/01/20 0842 12/01/20 0942  BP: (!) 153/97 126/82    Pulse: 83 90 82 100  Resp: 18 15 16    Temp: 97.8 F (36.6 C) 98 F (36.7 C)    TempSrc: Oral  Oral    SpO2: 92% 95% 98% 93%  Weight:      Height:        Intake/Output Summary (Last 24 hours) at 12/01/2020 1052 Last data filed at 12/01/2020 0730 Gross per 24 hour  Intake 3507.37 ml  Output 450 ml  Net 3057.37 ml   Filed Weights   11/26/20 1825 11/27/20 0140  Weight: 110 kg 111.5 kg     Data Reviewed:   CBC: Recent Labs  Lab 11/26/20 1845 11/27/20 0441 11/27/20 0825 11/28/20 0453 11/29/20 0052 11/30/20 0500 12/01/20 0314  WBC 8.4 7.2  --  7.3 5.3 5.0 5.1  NEUTROABS 6.6  --   --   --   --   --   --   HGB 13.2 11.2*   --  10.9* 10.8* 12.1* 11.1*  HCT 38.9* 30.9* NOT PERFORMED 29.5* 30.6* 33.6* 30.4*  MCV 96.0 92.5  --  89.7 91.6 91.1 91.6  PLT 105* PLATELET CLUMPS NOTED ON SMEAR, COUNT APPEARS DECREASED  --  94* 110* 128* 914*   Basic Metabolic Panel: Recent Labs  Lab 11/27/20 0441 11/27/20 0612 11/28/20 0453 11/29/20 0052 11/30/20 0500 12/01/20 0314  NA 135 135 138 138 140  --   K 3.7 4.1 3.3* 3.2* 3.6  --   CL 105 108 104 107 112*  --   CO2 22 22 26 25 23   --   GLUCOSE 195* 192* 128* 127* 110*  --   BUN 18 17 17 20 18   --   CREATININE 1.18 1.15 0.92 1.03 1.28*  --   CALCIUM 8.4* 8.4* 8.4* 8.1* 8.6*  --   MG  --   --  1.8 1.8 1.9 1.7   GFR: Estimated Creatinine Clearance: 68.3 mL/min (A) (by C-G formula based on SCr of 1.28 mg/dL (H)). Liver Function Tests: Recent Labs  Lab 11/26/20 1845 11/27/20 0612 11/28/20 0453 11/29/20 0052 11/30/20 0500  AST 19 33 29 20 21   ALT 15 17 19 16 16   ALKPHOS 39 32* 31* 29* 33*  BILITOT 0.9 1.2 1.6* 1.2 1.1  PROT 7.6 6.1* 6.1* 5.6* 6.1*  ALBUMIN 3.9 3.2* 3.0* 2.6* 2.8*   No results for input(s): LIPASE, AMYLASE in the last 168 hours. Recent Labs  Lab 11/27/20 0825  AMMONIA <9*   Coagulation Profile: Recent Labs  Lab 11/26/20 1845 11/27/20 0612  INR 1.2 1.2   Cardiac Enzymes: No results for input(s): CKTOTAL, CKMB, CKMBINDEX, TROPONINI in the last 168 hours. BNP (last 3 results) No results for input(s): PROBNP in the last 8760 hours. HbA1C: No results for input(s): HGBA1C in the last 72 hours. CBG: Recent Labs  Lab 11/30/20 0525 11/30/20 1135 11/30/20 1621 11/30/20 2322 12/01/20 0544  GLUCAP 107* 79 160* 103* 104*   Lipid Profile: No results for input(s): CHOL, HDL, LDLCALC, TRIG, CHOLHDL, LDLDIRECT in the last 72 hours. Thyroid Function Tests: No results for input(s): TSH, T4TOTAL, FREET4, T3FREE, THYROIDAB in the last 72 hours. Anemia Panel: No results for input(s): VITAMINB12, FOLATE, FERRITIN, TIBC, IRON, RETICCTPCT in  the last 72 hours. Sepsis Labs: Recent Labs  Lab 11/26/20 1849 11/26/20 2020 11/27/20 0441 11/28/20 0453  PROCALCITON  --  0.20 0.16 0.11  LATICACIDVEN 1.6 1.5  --   --     Recent Results (from the past 240 hour(s))  Urine culture     Status: None   Collection Time: 11/26/20  6:28 PM   Specimen: In/Out Cath Urine  Result Value Ref Range Status  Specimen Description   Final    IN/OUT CATH URINE Performed at [facility], [address] [place], [place] [address]    Special Requests   Final    NONE Performed at [facility], [address] [place], [place] [address]    Culture   Final    NO GROWTH Performed at [facility], [place] [address] [place], [place] [address]    Report Status 11/28/2020 FINAL  Final  Blood Culture (routine x 2)     Status: None   Collection Time: 11/26/20  6:45 PM   Specimen: BLOOD  Result Value Ref Range Status   Specimen Description BLOOD RIGHT ANTECUBITAL  Final   Special Requests   Final    BOTTLES DRAWN AEROBIC AND ANAEROBIC Blood Culture adequate volume   Culture   Final    NO GROWTH 5 DAYS Performed at [facility], [address] [place], [place] [address]    Report Status 12/01/2020 FINAL  Final  Resp Panel by RT-PCR (Flu A&B, Covid) Nasopharyngeal Swab     Status: None   Collection Time: 11/26/20  7:25 PM   Specimen: Nasopharyngeal Swab; Nasopharyngeal(NP) swabs in vial transport medium  Result Value Ref Range Status   SARS Coronavirus 2 by RT PCR NEGATIVE NEGATIVE Final    Comment: (NOTE) SARS-CoV-2 target nucleic acids are NOT DETECTED.  The SARS-CoV-2 RNA is generally detectable in upper respiratory specimens during the acute phase of infection. The lowest concentration of SARS-CoV-2 viral copies this assay can detect is 138 copies/mL. A negative result does not preclude SARS-Cov-2 infection and should not be used as the sole basis for treatment or other patient management decisions. A negative result may occur with   improper specimen collection/handling, submission of specimen other than nasopharyngeal swab, presence of viral mutation(s) within the areas targeted by this assay, and inadequate number of viral copies(<138 copies/mL). A negative result must be combined with clinical observations, patient history, and epidemiological information. The expected result is Negative.  Fact Sheet for Patients:  EntrepreneurPulse.com.au  Fact Sheet for Healthcare Providers:  IncredibleEmployment.be  This test is no t yet approved or cleared by the [place] FDA and  has been authorized for detection and/or diagnosis of SARS-CoV-2 by FDA under an Emergency Use Authorization (EUA). This EUA will remain  in effect (meaning this test can be used) for the duration of the COVID-19 declaration under Section 564(b)(1) of the Act, 21 U.S.C.section 360bbb-3(b)(1), unless the authorization is terminated  or revoked sooner.       Influenza A by PCR NEGATIVE NEGATIVE Final   Influenza B by PCR NEGATIVE NEGATIVE Final    Comment: (NOTE) The Xpert Xpress SARS-CoV-2/FLU/RSV plus assay is intended as an aid in the diagnosis of influenza from Nasopharyngeal swab specimens and should not be used as a sole basis for treatment. Nasal washings and aspirates are unacceptable for Xpert Xpress SARS-CoV-2/FLU/RSV testing.  Fact Sheet for Patients: EntrepreneurPulse.com.au  Fact Sheet for Healthcare Providers: IncredibleEmployment.be  This test is not yet approved or cleared by the [place] FDA and has been authorized for detection and/or diagnosis of SARS-CoV-2 by FDA under an Emergency Use Authorization (EUA). This EUA will remain in effect (meaning this test can be used) for the duration of the COVID-19 declaration under Section 564(b)(1) of the Act, 21 U.S.C. section 360bbb-3(b)(1), unless the authorization is terminated  or revoked.  Performed at [facility], [address] [place], [place] [address]   Blood Culture (routine x  2)     Status: None   Collection Time: 11/26/20  7:32 PM   Specimen: BLOOD  Result Value Ref Range Status   Specimen Description BLOOD BLOOD LEFT HAND  Final   Special Requests   Final    BOTTLES DRAWN AEROBIC AND ANAEROBIC Blood Culture adequate volume   Culture   Final    NO GROWTH 5 DAYS Performed at [facility], [address] [place], [place] [address]    Report Status 12/01/2020 FINAL  Final  MRSA PCR Screening     Status: None   Collection Time: 11/27/20  4:12 AM   Specimen: Nasal Mucosa; Nasopharyngeal  Result Value Ref Range Status   MRSA by PCR NEGATIVE NEGATIVE Final    Comment:        The GeneXpert MRSA Assay (FDA approved for NASAL specimens only), is one component of a comprehensive MRSA colonization surveillance program. It is not intended to diagnose MRSA infection nor to guide or monitor treatment for MRSA infections. Performed at [facility], [place] [address], [place], [place] [address]   CSF culture     Status: None   Collection Time: 11/28/20 10:31 AM   Specimen: CSF; Cerebrospinal Fluid  Result Value Ref Range Status   Specimen Description CSF  Final   Special Requests NONE  Final   Gram Stain   Final    WBC PRESENT, PREDOMINANTLY MONONUCLEAR NO ORGANISMS SEEN CYTOSPIN SMEAR    Culture   Final    NO GROWTH 3 DAYS Performed at [facility], [place] [address] [place], [place] [address]    Report Status 12/01/2020 FINAL  Final         Radiology Studies: MR BRAIN W WO CONTRAST  Result Date: 11/29/2020 CLINICAL DATA:  Stroke, follow-up. Rule out infection. History of stage IV squamous cell carcinoma of the lung. EXAM: MRI HEAD WITHOUT AND WITH CONTRAST TECHNIQUE: Multiplanar, multiecho pulse sequences of the brain and surrounding structures were obtained without and with intravenous contrast. CONTRAST:  59mL GADAVIST  GADOBUTROL 1 MMOL/ML IV SOLN COMPARISON:  Head CT November 26, 2020 FINDINGS: The study is partially degraded by motion, particularly post-contrast images. Brain: No acute infarction, hemorrhage, extra-axial collection or mass lesion. Few scattered foci of T2 hyperintensity are seen within the white matter of the cerebral hemispheres, nonspecific, most likely related to mild chronic microangiopathic changes. 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 Prominence of the supratentorial ventricles with relative preservation of the high convexity sulci which may be seen in the setting of normal pressure hydrocephalus in the appropriate clinical scenario. Postcontrast images are significantly degraded by motion. No gross abnormal contrast enhancement. Vascular: Normal flow voids. Skull and upper cervical spine: Normal marrow signal. Sinuses/Orbits: Mild mucosal thickening of the sphenoid sinuses. Bilateral lens surgery. IMPRESSION: 1. Motion degraded study. 2. No acute intracranial abnormality. 3. Prominence of the supratentorial ventricles with relative preservation of the high convexity sulci which may be seen in the setting of normal pressure hydrocephalus in the appropriate clinical scenario. 4. Mild chronic microangiopathic changes. 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 Electronically Signed   By: Pedro Earls M.D.   On: 11/29/2020 13:19        Scheduled Meds: . Chlorhexidine Gluconate Cloth  6 each Topical Daily  . fluticasone furoate-vilanterol  1 puff Inhalation Daily   And  . umeclidinium bromide  1 puff Inhalation Daily  .  ipratropium-albuterol  3 mL Nebulization BID  . levothyroxine  175 mcg Oral Q0600  . mouth rinse  15 mL Mouth Rinse BID  . senna-docusate  2 tablet Oral QHS  . sodium chloride flush  10-40 mL Intracatheter Q12H   Continuous Infusions: . doxycycline (VIBRAMYCIN) IV Stopped  (12/01/20 0921)  . magnesium sulfate bolus IVPB       LOS: 5 days   Time spent= 25 mins    Geradine Girt, DO [facility]  If 7PM-7AM, please contact night-coverage  12/01/2020, 10:52 AM

## 2020-12-02 ENCOUNTER — Other Ambulatory Visit: Payer: Self-pay | Admitting: Internal Medicine

## 2020-12-02 DIAGNOSIS — G0490 Encephalitis and encephalomyelitis, unspecified: Secondary | ICD-10-CM | POA: Diagnosis not present

## 2020-12-02 LAB — CBC
HCT: 33.9 % — ABNORMAL LOW (ref 39.0–52.0)
Hemoglobin: 11.8 g/dL — ABNORMAL LOW (ref 13.0–17.0)
MCH: 32.3 pg (ref 26.0–34.0)
MCHC: 34.8 g/dL (ref 30.0–36.0)
MCV: 92.9 fL (ref 80.0–100.0)
Platelets: 160 10*3/uL (ref 150–400)
RBC: 3.65 MIL/uL — ABNORMAL LOW (ref 4.22–5.81)
RDW: 11.9 % (ref 11.5–15.5)
WBC: 5.6 10*3/uL (ref 4.0–10.5)
nRBC: 0 % (ref 0.0–0.2)

## 2020-12-02 LAB — BASIC METABOLIC PANEL
Anion gap: 7 (ref 5–15)
BUN: 13 mg/dL (ref 8–23)
CO2: 23 mmol/L (ref 22–32)
Calcium: 8.9 mg/dL (ref 8.9–10.3)
Chloride: 110 mmol/L (ref 98–111)
Creatinine, Ser: 1.06 mg/dL (ref 0.61–1.24)
GFR, Estimated: >60 mL/min (ref >60)
Glucose, Bld: 103 mg/dL — ABNORMAL HIGH (ref 70–99)
Potassium: 3.5 mmol/L (ref 3.5–5.1)
Sodium: 140 mmol/L (ref 135–145)

## 2020-12-02 LAB — GLUCOSE, CAPILLARY
Glucose-Capillary: 108 mg/dL — ABNORMAL HIGH (ref 70–99)
Glucose-Capillary: 110 mg/dL — ABNORMAL HIGH (ref 70–99)
Glucose-Capillary: 153 mg/dL — ABNORMAL HIGH (ref 70–99)

## 2020-12-02 LAB — MAGNESIUM: Magnesium: 1.8 mg/dL (ref 1.7–2.4)

## 2020-12-02 MED ORDER — DOXYCYCLINE HYCLATE 100 MG PO TABS: 100.0000 mg | ORAL_TABLET | Freq: Two times a day (BID) | ORAL | 0 refills | Status: DC

## 2020-12-02 MED ORDER — HEPARIN SOD (PORK) LOCK FLUSH 100 UNIT/ML IV SOLN
500.0000 [IU] | INTRAVENOUS | Status: AC | PRN
  Administered 2020-12-02: 500 [IU]
  Filled 2020-12-02: qty 5

## 2020-12-02 MED ORDER — DOXYCYCLINE HYCLATE 100 MG PO TABS
100.0000 mg | ORAL_TABLET | Freq: Two times a day (BID) | ORAL | Status: DC
  Administered 2020-12-02: 100 mg via ORAL
  Filled 2020-12-02: qty 1

## 2020-12-02 MED FILL — DOXYCYCLINE HYCLATE 100 MG: 100 | 6 days supply | Qty: 12 | Fill #0

## 2020-12-02 NOTE — Plan of Care (Signed)

## 2020-12-02 NOTE — Progress Notes (Signed)
Discharge paperwork was reviewed with the patient including follow up appointments, changes to medications, and discharge instructions. Patient changed into street clothes. Pt was transported via wheelchair by this nurse to to his wife waiting in [place] parking in stable condition.  Leonie Man, RN

## 2020-12-02 NOTE — Progress Notes (Signed)
Physical Therapy Treatment Patient Details Name: Travis Padilla MRN: [id number] DOB: March 21, 1946 Today's Date: 12/02/2020    History of Present Illness 75 yo admitted 3/19 with AMS with noted tick bite. Pt with negative lumbar puncture 3/21 and negative MRI. PMHx: stage IV CA, hypothyroidism, CAD, anemia    PT Comments    Pt very pleasant and demonstrating improved cognition, balance and activity tolerance this session. Pt able to walk without RW long hall distance and complete stairs. Pt answered all orientation, money management and simple problem solving questions appropriately. Pt appropriate for return home with education for progression of mobility as he begins to feel more stable on his feet.    Follow Up Recommendations  No PT follow up     Equipment Recommendations  None recommended by PT    Recommendations for Other Services       Precautions / Restrictions Precautions Precautions: Fall    Mobility  Bed Mobility               General bed mobility comments: in chair on arrival and end of session    Transfers Overall transfer level: Modified independent                  Ambulation/Gait Ambulation/Gait assistance: Modified independent (Device/Increase time) Gait Distance (Feet): 400 Feet Assistive device: None Gait Pattern/deviations: Step-through pattern;Decreased stride length   Gait velocity interpretation: 1.31 - 2.62 ft/sec, indicative of limited community ambulator General Gait Details: pt with decreased speed without LOB with pt noting slight unsteadiness from baseline and adjusting speed accordingly   Stairs Stairs: Yes Stairs assistance: Modified independent (Device/Increase time) Stair Management: Alternating pattern;Forwards;One rail Right Number of Stairs: 11     Wheelchair Mobility    Modified Rankin (Stroke Patients Only)       Balance Overall balance assessment: Mild deficits observed, not formally tested                                           Cognition Arousal/Alertness: Awake/alert Behavior During Therapy: WFL for tasks assessed/performed Overall Cognitive Status: Within Functional Limits for tasks assessed                                        Exercises      General Comments        Pertinent Vitals/Pain Pain Assessment: No/denies pain    Home Living                      Prior Function            PT Goals (current goals can now be found in the care plan section) Progress towards PT goals: Progressing toward goals    Frequency    Min 3X/week      PT Plan Current plan remains appropriate    Co-evaluation              AM-PAC PT "6 Clicks" Mobility   Outcome Measure  Help needed turning from your back to your side while in a flat bed without using bedrails?: None Help needed moving from lying on your back to sitting on the side of a flat bed without using bedrails?: None Help needed moving to and from a bed to a chair (including a  wheelchair)?: None Help needed standing up from a chair using your arms (e.g., wheelchair or bedside chair)?: None Help needed to walk in hospital room?: None Help needed climbing 3-5 steps with a railing? : None 6 Click Score: 24    End of Session Equipment Utilized During Treatment: Gait belt Activity Tolerance: Patient tolerated treatment well Patient left: in chair;with call bell/phone within reach;with chair alarm set Nurse Communication: Mobility status PT Visit Diagnosis: Other abnormalities of gait and mobility (R26.89)     Time: 2072-1828 PT Time Calculation (min) (ACUTE ONLY): 13 min  Charges:  $Gait Training: 8-22 mins                     [place], PT Acute Rehabilitation Services Pager: [phone] Office: [place] 12/02/2020, 9:41 AM

## 2020-12-02 NOTE — Discharge Summary (Addendum)
Physician Discharge Summary  Travis Padilla YTK:[id number] DOB: Sep 01, 1946 DOA: 11/26/2020  PCP: Travis Norlander, DO  Admit date: 11/26/2020 Discharge date: 12/02/2020  Admitted From: home Discharge disposition: home   Recommendations for Outpatient Follow-Up:   1. Patient to follow up with ID for remaining infectious disease studies 2. 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  Diet recommendation: Low sodium, heart healthy  Wound care: None.  Code status: Full.   History of Present Illness:   Travis Padilla  is a 75 y.o. male, with PMH of stage IV lung cancer, on immunotherapy by Dr. Earlie Padilla, city, hypothyroidism, CAD, anemia, patient was brought by his wife to ED secondary to altered mental status, generalized weakness and fever per wife patient was at his usual state of health on Thursday, he had poor sleep on Thursday evening, yesterday he was complaining of sore throat, cough, nasal congestion and sinuses, had virtual visit by PCP where he was started on Augmentin, reports earlier today he was sleeping all day, more weak, frail, he was not talking this evening around 6:30 PM which prompted her to call EMS, patient was febrile 103.6 in ED, she does mention he had complaints of headache, she she reports he never complained of photophobia, he has chronic neck pain, as well she does report he had some worsening complaints of neck pain, as well had complaints of lower back pain. - in ED febrile 103.6, significantly restless and agitated required Ativan, CT head  with no acute findings, chest x-ray with cardiomegaly with vascular congestion, with perihilar opacities could reflect edema, atelectasis or infiltrate, he had negative urine analysis, patient was started on broad-spectrum antibiotics and [facility] hospitalist consulted to admit.   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  No obvious source identified therefore cannot confirm sepsis -Cultures-NGTD.  Gram stain-no organisms seen -LP performed 3/21 under general anesthesia-cultures are negative, HSV PCR negative -ID consulted- doxy x 10 days -MRI brain with and without contrast- negative -Covid, flu-negative  Acute metabolic encephalopathy; improved since admission  Alert awake oriented X3 -CT head negative.  Suspect from underlying infection?- tick borne illness -MRI brain with and without contrast unrevealing -No focal neuro deficits.  Continue neurochecks -TSH B12 and ammonia normal.    Tick bite noted, without rash- left axilla -Removed.  Currently on doxycycline x 10 days  Lung cancer, SCC Hx of COPD with Chronic Bronchitis.  -On immunotherapy follows with Dr. Earlie Padilla  Essential hypertension -resume home meds  Hypothyroidism -On levothyroxine  Hypomagnesemia -repleted     Medical Consultants:   ID   Discharge Exam:   Vitals:   12/02/20 1119 12/02/20 1300  BP:  102/66  Pulse:  99  Resp:  15  Temp:  98 F (36.7 C)  SpO2: 93% (!) 84%   Vitals:   12/02/20 0939 12/02/20 1042 12/02/20 1119 12/02/20 1300  BP:  131/89  102/66  Pulse: 97 90  99  Resp:  20  15  Temp:  98.2 F (36.8 C)  98 F (36.7 C)  TempSrc:  Oral  Oral  SpO2:   93% (!) 84%  Weight:      Height:        General exam: Appears calm and comfortable.   The results of significant diagnostics from this hospitalization (including imaging, microbiology, ancillary and  laboratory) are listed below for reference.     Procedures and Diagnostic Studies:   CT Head Wo Contrast  Result Date: 11/26/2020 CLINICAL DATA:  Confusion EXAM: CT HEAD WITHOUT CONTRAST TECHNIQUE: Contiguous axial images were obtained from the base of the skull through the vertex without intravenous contrast. COMPARISON:  08/19/2018 FINDINGS: Brain: There is atrophy and chronic small vessel disease changes. No acute intracranial abnormality. Specifically, no hemorrhage, hydrocephalus, mass lesion, acute infarction, or significant intracranial injury. Vascular: No hyperdense vessel or unexpected calcification. Skull: No acute calvarial abnormality. Sinuses/Orbits: Visualized paranasal sinuses and mastoids clear. Orbital soft tissues unremarkable. Other: None IMPRESSION: Atrophy, chronic microvascular disease. No acute intracranial abnormality. Electronically Signed   By: Rolm Baptise M.D.   On: 11/26/2020 20:13   DG Chest Port 1 View  Result Date: 11/26/2020 CLINICAL DATA:  Questionable sepsis, fever, altered mental status EXAM: PORTABLE CHEST 1 VIEW COMPARISON:  09/24/2017 FINDINGS: Cardiomegaly, vascular congestion. Perihilar opacities could reflect atelectasis, infiltrates or edema. Right Port-A-Cath remains in place, unchanged. No effusions or acute bony abnormality. IMPRESSION: Cardiomegaly with vascular congestion. Perihilar opacities could reflect edema, atelectasis or infiltrates. Electronically Signed   By: Rolm Baptise M.D.   On: 11/26/2020 19:54     Labs:   Basic Metabolic Panel: No results for input(s): NA, K, CL, CO2, GLUCOSE, BUN, CREATININE, CALCIUM, MG, PHOS in the last 168 hours. GFR Estimated Creatinine Clearance: 82.4 mL/min (by C-G formula based on SCr of 1.06 mg/dL). Liver Function Tests: No results for input(s): AST, ALT, ALKPHOS, BILITOT, PROT, ALBUMIN in the last 168 hours. No results for input(s): LIPASE, AMYLASE in the last 168 hours. No results for input(s): AMMONIA  in the last 168 hours. Coagulation profile No results for input(s): INR, PROTIME in the last 168 hours.  CBC: No results for input(s): WBC, NEUTROABS, HGB, HCT, MCV, PLT in the last 168 hours. Cardiac Enzymes: No results for input(s): CKTOTAL, CKMB, CKMBINDEX, TROPONINI in the last 168 hours. BNP: Invalid input(s): POCBNP CBG: No results for input(s): GLUCAP in the last 168 hours. D-Dimer No results for input(s): DDIMER in the last 72 hours. Hgb A1c No results for input(s): HGBA1C in the last 72 hours. Lipid Profile No results for input(s): CHOL, HDL, LDLCALC, TRIG, CHOLHDL, LDLDIRECT in the last 72 hours. Thyroid function studies No results for input(s): TSH, T4TOTAL, T3FREE, THYROIDAB in the last 72 hours.  Invalid input(s): FREET3 Anemia work up No results for input(s): VITAMINB12, FOLATE, FERRITIN, TIBC, IRON, RETICCTPCT in the last 72 hours. Microbiology No results found for this or any previous visit (from the past 240 hour(s)).   Discharge Instructions:   Discharge Instructions    Diet - low sodium heart healthy   Complete by: As directed    Increase activity slowly   Complete by: As directed    No wound care   Complete by: As directed      Allergies as of 12/02/2020      Reactions   Pravastatin    Numbness in feet    Iodine Hives, Rash   CT scan contrast caused rash and itching  Medication List    STOP taking these medications   amoxicillin-clavulanate 875-125 MG tablet Commonly known as: AUGMENTIN   azelastine 0.1 % nasal spray Commonly known as: ASTELIN   diphenhydramine-acetaminophen 25-500 MG Tabs tablet Commonly known as: TYLENOL PM   guaiFENesin-dextromethorphan 100-10 MG/5ML syrup Commonly known as: ROBITUSSIN DM   oxyCODONE-acetaminophen 5-325 MG tablet Commonly known as: PERCOCET/ROXICET   PRESERVISION AREDS 2 PO     TAKE these medications   acetaminophen 500 MG tablet Commonly known as: TYLENOL Take 1,000 mg by mouth every 6  (six) hours as needed.   albuterol (2.5 MG/3ML) 0.083% nebulizer solution Commonly known as: PROVENTIL Inhale 3 mLs (2.5 mg total) into the lungs every 6 (six) hours as needed for wheezing or shortness of breath.   ALPRAZolam 0.25 MG tablet Commonly known as: XANAX Take 1 tablet (0.25 mg total) by mouth at bedtime as needed for anxiety.   carvedilol 12.5 MG tablet Commonly known as: COREG Take 1 tablet (12.5 mg total) by mouth in the morning, at noon, and at bedtime.   ferrous sulfate 324 MG Tbec Take 324 mg by mouth daily with breakfast.   fluticasone 50 MCG/ACT nasal spray Commonly known as: FLONASE Place 1 spray into both nostrils daily.   levothyroxine 175 MCG tablet Commonly known as: SYNTHROID TAKE 1 TABLET BY MOUTH EVERY DAY BEFORE BREAKFAST   lidocaine-prilocaine cream Commonly known as: EMLA Apply 1 application topically as needed.   methocarbamol 500 MG tablet Commonly known as: ROBAXIN Take 500 mg by mouth daily.   Praluent 150 MG/ML Soaj Generic drug: Alirocumab Inject 150 mg into the skin every 14 (fourteen) days.   senna-docusate 8.6-50 MG tablet Commonly known as: Senna S 1 to 2 twice daily for constipation   SOOTHE XP OP Place 1 drop into both eyes 2 (two) times daily.   Trelegy Ellipta 100-62.5-25 MCG/INH Aepb Generic drug: Fluticasone-Umeclidin-Vilant Inhale 1 puff into the lungs daily. What changed: Another medication with the same name was removed. Continue taking this medication, and follow the directions you see here.   Vitamin B 12 250 MCG Lozg Take by mouth.       Follow-up [facility] Follow up on 12/21/2020.   Specialty: Infectious Diseases Why: 1:45 pm appointment. Please call to reschedule if needed. Arrive 15 minutes early to register.  Contact information: [place], [place] [phone] [place], [place], DO Follow up in 1 week(s).   Specialty: Family Medicine Contact information: [place] [address] [phone]                Time coordinating discharge: 35 min  Signed:  Geradine Girt DO  [facility] 12/16/2020, 5:18 PM

## 2020-12-02 NOTE — Plan of Care (Signed)
  Problem: Education: Goal: Knowledge of General Education information will improve Description: Including pain rating scale, medication(s)/side effects and non-pharmacologic comfort measures 12/02/2020 1107 by Leonie Man, RN Outcome: Adequate for Discharge 12/02/2020 1023 by Leonie Man, RN Outcome: Progressing   Problem: Health Behavior/Discharge Planning: Goal: Ability to manage health-related needs will improve 12/02/2020 1107 by Leonie Man, RN Outcome: Adequate for Discharge 12/02/2020 1023 by Leonie Man, RN Outcome: Progressing   Problem: Clinical Measurements: Goal: Ability to maintain clinical measurements within normal limits will improve 12/02/2020 1107 by Leonie Man, RN Outcome: Adequate for Discharge 12/02/2020 1023 by Leonie Man, RN Outcome: Progressing Goal: Will remain free from infection 12/02/2020 1107 by Leonie Man, RN Outcome: Adequate for Discharge 12/02/2020 1023 by Leonie Man, RN Outcome: Progressing Goal: Diagnostic test results will improve 12/02/2020 1107 by Leonie Man, RN Outcome: Adequate for Discharge 12/02/2020 1023 by Leonie Man, RN Outcome: Progressing Goal: Respiratory complications will improve 12/02/2020 1107 by Leonie Man, RN Outcome: Adequate for Discharge 12/02/2020 1023 by Leonie Man, RN Outcome: Progressing Goal: Cardiovascular complication will be avoided 12/02/2020 1107 by Leonie Man, RN Outcome: Adequate for Discharge 12/02/2020 1023 by Leonie Man, RN Outcome: Progressing   Problem: Activity: Goal: Risk for activity intolerance will decrease 12/02/2020 1107 by Leonie Man, RN Outcome: Adequate for Discharge 12/02/2020 1023 by Leonie Man, RN Outcome: Progressing   Problem: Nutrition: Goal: Adequate nutrition will be maintained 12/02/2020 1107 by Leonie Man, RN Outcome: Adequate for Discharge 12/02/2020 1023 by Leonie Man, RN Outcome: Progressing   Problem: Coping: Goal: Level of anxiety  will decrease 12/02/2020 1107 by Leonie Man, RN Outcome: Adequate for Discharge 12/02/2020 1023 by Leonie Man, RN Outcome: Progressing   Problem: Elimination: Goal: Will not experience complications related to bowel motility 12/02/2020 1107 by Leonie Man, RN Outcome: Adequate for Discharge 12/02/2020 1023 by Leonie Man, RN Outcome: Progressing Goal: Will not experience complications related to urinary retention 12/02/2020 1107 by Leonie Man, RN Outcome: Adequate for Discharge 12/02/2020 1023 by Leonie Man, RN Outcome: Progressing   Problem: Pain Managment: Goal: General experience of comfort will improve 12/02/2020 1107 by Leonie Man, RN Outcome: Adequate for Discharge 12/02/2020 1023 by Leonie Man, RN Outcome: Progressing   Problem: Safety: Goal: Ability to remain free from injury will improve 12/02/2020 1107 by Leonie Man, RN Outcome: Adequate for Discharge 12/02/2020 1023 by Leonie Man, RN Outcome: Progressing   Problem: Skin Integrity: Goal: Risk for impaired skin integrity will decrease 12/02/2020 1107 by Leonie Man, RN Outcome: Adequate for Discharge 12/02/2020 1023 by Leonie Man, RN Outcome: Progressing

## 2020-12-07 DIAGNOSIS — I2510 Atherosclerotic heart disease of native coronary artery without angina pectoris: Secondary | ICD-10-CM | POA: Insufficient documentation

## 2020-12-07 NOTE — Progress Notes (Addendum)
Cardiology Office Note   Date:  01/14/2021   ID:  Travis, Padilla Dec 02, 1945, MRN [id number]  PCP:  [name], DO  Cardiologist:   None   Chief Complaint  Patient presents with  . Weakness      History of Present Illness: Travis Padilla is a 75 y.o. male who presents for follow up of palpitations.  He has been treated for small cell lung cancer.   He was treated with carrboplatin, paclitaxel  and Keytruda.  He has had radiation.   I reviewed the oncology notes and CT report for this appointment.  CT demonstrated aortic atherosclerosis as well as coronary artery calcification. POET (Plain Old Exercise Treadmill) demonstrated no high risk findings.  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     He is also on maintenance therapy for lung cancer.  He is getting Keytruda every 3 weeks although this was interrupted with the recent hospitalization.  He has been weak.  He has had dizziness related he thinks sinusitis.  He has only been taking his Praluent once a month.  He denies any chest pressure, neck or arm discomfort.  He has not been doing as much so he is not clear on whether he is having any shortness of breath.  He does not have any PND or orthopnea.  Past Medical History:  Diagnosis Date  . Abnormal nuclear stress test    December, 2013  . Anemia   . Axillary adenopathy 04/24/2018  . CAD (coronary artery disease)    Mild nonobstructive plaque in cath 2013  . Chest pain    December, 2013  . Cough with hemoptysis 04/22/2018  . Dizziness   . Dyslipidemia 07/14/2019  . Encounter for antineoplastic immunotherapy 05/13/2018  . Gout   . Hemorrhoid   . Hyperlipidemia   . Hypertension   . Hypothyroidism (acquired) 08/11/2018  . Metastasis to lung ([place]) 05/19/2018  . 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) ([place]) dx'd 04/17/18   to LN, infrahilar mass, lung nodule and lt axilla  . Obesity,  unspecified 03/28/2009   Qualifier: Diagnosis of  By: [name], MD, [name]    . Palpitations 03/28/2009   Qualifier: Diagnosis of  By: [name], MD, [name]    . Right lower lobe lung mass 04/22/2018  . Skin cancer   . SNORING 03/28/2009   Qualifier: Diagnosis of  By: [name], MD, [name]    . Spinal stenosis   . 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[place]) 05/13/2018    Past Surgical History:  Procedure Laterality Date  . 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 BELPHAROPTOSIS REPAIR Bilateral   . CATARACT EXTRACTION W/ INTRAOCULAR LENS  IMPLANT, BILATERAL    . COLONOSCOPY N/A 11/03/2014   Procedure: COLONOSCOPY;  Surgeon: Rogene Houston, MD;  Location: [facility];  Service: Endoscopy;  Laterality: N/A;  1225  . IR CV LINE INJECTION  08/24/2020  . IR FLUORO GUIDED NEEDLE PLC ASPIRATION/INJECTION LOC  11/28/2020  . IR IMAGING GUIDED PORT INSERTION  07/11/2018  . KNEE CARTILAGE SURGERY Left    Left knee  . SKIN CANCER EXCISION  12/2014, 04/25/15  . VIDEO BRONCHOSCOPY Bilateral 05/09/2018   Procedure: VIDEO BRONCHOSCOPY WITH FLUORO;  Surgeon: Juanito Doom, MD;  Location: [facility];  Service: Cardiopulmonary;  Laterality: Bilateral;     Current Outpatient Medications  Medication  Sig Dispense Refill  . acetaminophen (TYLENOL) 500 MG tablet Take 1,000 mg by mouth every 6 (six) hours as needed.    . Alirocumab (PRALUENT) 150 MG/ML SOAJ Inject 150 mg into the skin every 14 (fourteen) days. 2 pen 11  . ALPRAZolam (XANAX) 0.25 MG tablet Take 1 tablet (0.25 mg total) by mouth at bedtime as needed for anxiety. 30 tablet 0  . Artificial Tear Solution (SOOTHE XP OP) Place 1 drop into both eyes 2 (two) times daily.    . fluticasone (FLONASE) 50 MCG/ACT nasal spray Place 1 spray into both nostrils daily. 16 g 3  . Fluticasone-Umeclidin-Vilant (TRELEGY ELLIPTA) 100-62.5-25 MCG/INH AEPB Inhale 1 puff into the lungs daily. 60 each 6  . lidocaine-prilocaine (EMLA) cream Apply 1  application topically as needed. 30 g 1  . methocarbamol (ROBAXIN) 500 MG tablet Take 500 mg by mouth daily.    Marland Kitchen senna-docusate (SENNA S) 8.6-50 MG tablet 1 to 2 twice daily for constipation 120 tablet 5  . carvedilol (COREG) 12.5 MG tablet Take 1 tablet (12.5 mg total) by mouth in the morning, at noon, and at bedtime. 270 tablet 3  . levothyroxine (SYNTHROID) 175 MCG tablet TAKE 1 TABLET BY MOUTH EVERY DAY BEFORE BREAKFAST 30 tablet 1  . pantoprazole (PROTONIX) 40 MG tablet TAKE 1 TABLET (40 MG TOTAL) BY MOUTH DAILY. X2 WEEKS PER FLAREUP OF REFLUX 30 tablet 0   No current facility-administered medications for this visit.    Allergies:   Pravastatin and Iodine    ROS:  Please see the history of present illness.   Otherwise, review of systems are positive for none.   All other systems are reviewed and negative.    PHYSICAL EXAM: VS:  BP 122/80 (BP Location: Left Arm, Patient Position: Sitting)   Pulse 75   Ht 6\' 2"  (1.88 m)   Wt 241 lb 3.2 oz (109.4 kg)   SpO2 96%   BMI 30.97 kg/m  , BMI Body mass index is 30.97 kg/m. GENERAL:  Well appearing NECK:  No jugular venous distention, waveform within normal limits, carotid upstroke brisk and symmetric, no bruits, no thyromegaly LUNGS:  Clear to auscultation bilaterally CHEST:  Unremarkable HEART:  PMI not displaced or sustained,S1 and S2 within normal limits, no S3, no S4, no clicks, no rubs, no murmurs ABD:  Flat, positive bowel sounds normal in frequency in pitch, no bruits, no rebound, no guarding, no midline pulsatile mass, no hepatomegaly, no splenomegaly EXT:  2 plus pulses throughout, no edema, no cyanosis no clubbing    EKG:  EKG is  ordered today. The ekg ordered today demonstrates sinus rhythm, rate 77, leftward axis, early transition lead V2, no acute ST-T wave changes.   Recent Labs: 11/28/2020: B Natriuretic Peptide 479.9 12/02/2020: Magnesium 1.8 12/20/2020: ALT 9; BUN 22; Creatinine 1.03; Hemoglobin 11.5; Platelet  Count 139; Potassium 4.1; Sodium 142; TSH 2.406    Lipid Panel    Component Value Date/Time   CHOL 155 12/08/2020 1156   TRIG 159 (H) 12/08/2020 1156   HDL 33 (L) 12/08/2020 1156   CHOLHDL 4.7 12/08/2020 1156   LDLCALC 94 12/08/2020 1156      Wt Readings from Last 3 Encounters:  01/12/21 234 lb (106.1 kg)  12/20/20 240 lb (108.9 kg)  12/20/20 240 lb 12.8 oz (109.2 kg)      Other studies Reviewed: Additional studies/ records that were reviewed today include: Hospital recrods Review of the above records demonstrates:  Please see elsewhere in the  note.     ASSESSMENT AND PLAN:   CAD -  Since his POET (Plain Old Exercise Treadmill) .  I would like to follow-up an echocardiogram given some weakness.  He has been treated with Beryle Flock though he does not have signs of myocarditis and likely just get a baseline post his recent therapies and acute illnesses.  ESSENTIAL HYPERTENSION, BENIGN -  His blood pressure is controlled.  He will continue the meds as listed.   PALPITATIONS -   No change in therapy.   DYSLIPIDEMIA -  He is actually only taking his Praluent once daily.  I will check lipid and liver today.   Current medicines are reviewed at length with the patient today.  The patient does not have concerns regarding medicines.  The following changes have been made:  As above.   Labs/ tests ordered today include:   Orders Placed This Encounter  Procedures  . Lipid panel  . Hepatic function panel  . EKG 12-Lead  . ECHOCARDIOGRAM COMPLETE     Disposition:   FU with with me 6 months   Signed, [name], MD  01/14/2021 6:33 PM    [facility]

## 2020-12-08 ENCOUNTER — Other Ambulatory Visit: Payer: Self-pay

## 2020-12-08 ENCOUNTER — Encounter: Payer: Self-pay | Admitting: Cardiology

## 2020-12-08 ENCOUNTER — Ambulatory Visit (INDEPENDENT_AMBULATORY_CARE_PROVIDER_SITE_OTHER): Payer: Medicare Other | Admitting: Cardiology

## 2020-12-08 ENCOUNTER — Ambulatory Visit (INDEPENDENT_AMBULATORY_CARE_PROVIDER_SITE_OTHER): Payer: Medicare Other | Admitting: Family Medicine

## 2020-12-08 ENCOUNTER — Encounter: Payer: Self-pay | Admitting: Family Medicine

## 2020-12-08 VITALS — BP 122/80 | HR 75 | Ht 74.0 in | Wt 241.2 lb

## 2020-12-08 VITALS — BP 131/78 | HR 83 | Temp 96.1°F | Ht 74.0 in | Wt 240.0 lb

## 2020-12-08 DIAGNOSIS — R2689 Other abnormalities of gait and mobility: Secondary | ICD-10-CM | POA: Diagnosis not present

## 2020-12-08 DIAGNOSIS — K219 Gastro-esophageal reflux disease without esophagitis: Secondary | ICD-10-CM | POA: Diagnosis not present

## 2020-12-08 DIAGNOSIS — I10 Essential (primary) hypertension: Secondary | ICD-10-CM | POA: Diagnosis not present

## 2020-12-08 DIAGNOSIS — I2510 Atherosclerotic heart disease of native coronary artery without angina pectoris: Secondary | ICD-10-CM

## 2020-12-08 DIAGNOSIS — R002 Palpitations: Secondary | ICD-10-CM | POA: Diagnosis not present

## 2020-12-08 DIAGNOSIS — G479 Sleep disorder, unspecified: Secondary | ICD-10-CM

## 2020-12-08 DIAGNOSIS — E785 Hyperlipidemia, unspecified: Secondary | ICD-10-CM

## 2020-12-08 DIAGNOSIS — Z09 Encounter for follow-up examination after completed treatment for conditions other than malignant neoplasm: Secondary | ICD-10-CM

## 2020-12-08 DIAGNOSIS — G0490 Encephalitis and encephalomyelitis, unspecified: Secondary | ICD-10-CM

## 2020-12-08 DIAGNOSIS — C3491 Malignant neoplasm of unspecified part of right bronchus or lung: Secondary | ICD-10-CM | POA: Diagnosis not present

## 2020-12-08 DIAGNOSIS — R0609 Other forms of dyspnea: Secondary | ICD-10-CM

## 2020-12-08 DIAGNOSIS — W57XXXD Bitten or stung by nonvenomous insect and other nonvenomous arthropods, subsequent encounter: Secondary | ICD-10-CM | POA: Diagnosis not present

## 2020-12-08 DIAGNOSIS — R0600 Dyspnea, unspecified: Secondary | ICD-10-CM

## 2020-12-08 LAB — LIPID PANEL
Chol/HDL Ratio: 4.7 ratio (ref 0.0–5.0)
Cholesterol, Total: 155 mg/dL (ref 100–199)
HDL: 33 mg/dL — ABNORMAL LOW (ref >39)
LDL Chol Calc (NIH): 94 mg/dL (ref 0–99)
Triglycerides: 159 mg/dL — ABNORMAL HIGH (ref 0–149)
VLDL Cholesterol Cal: 28 mg/dL (ref 5–40)

## 2020-12-08 LAB — HEPATIC FUNCTION PANEL
ALT: 16 IU/L (ref 0–44)
AST: 17 IU/L (ref 0–40)
Albumin: 4.1 g/dL (ref 3.7–4.7)
Alkaline Phosphatase: 47 IU/L (ref 44–121)
Bilirubin Total: 0.4 mg/dL (ref 0.0–1.2)
Bilirubin, Direct: 0.15 mg/dL (ref 0.00–0.40)
Total Protein: 6.9 g/dL (ref 6.0–8.5)

## 2020-12-08 MED ORDER — PANTOPRAZOLE SODIUM 40 MG PO TBEC: 40.0000 mg | DELAYED_RELEASE_TABLET | Freq: Every day | ORAL | 1 refills | Status: DC

## 2020-12-08 MED ORDER — DOXYCYCLINE HYCLATE 100 MG PO TABS: 100.0000 mg | ORAL_TABLET | Freq: Two times a day (BID) | ORAL | 0 refills | Status: AC

## 2020-12-08 NOTE — Progress Notes (Signed)
Subjective: CC: hospital follow up encephalitis PCP: [name], DO WCB:JSEG Jerilynn Mages Vosler is a 75 y.o. male presenting to clinic today for:  1.  Encephalitis Patient was admitted to the hospital febrile with altered mental status.  Encephalitis treatment was initiated.  There were no acute findings on MRI.  He was found to have a tick attached to him he was started on doxycycline as a result.  This was ultimately believed to be the causation as his lumbar puncture was unremarkable.  His cultures were negative to date.  He was ultimately discharged with this medication and instructed to follow-up with infectious disease outpatient.  He has an appointment with infectious disease in less than 2 weeks.   He continues to have some foggy headedness, sinus pressure and feels that his balance is slightly off but denies any falls.  He plans on starting walking again soon and hopes that this will improve stamina.  Does not report any recurrent fevers.  He finished his doxycycline.  He does report some GERD symptoms in the last 10 days and is asking for assistance with that.  Additionally notes chronic issues with sleep.  He would like to be evaluated for sleep apnea or other sleep disorder.  He has Xanax at home but hesitates to use anything that is especially sedating.  He also thinks he has some old trazodone that he might be able to use if needed.   ROS: Per HPI  Allergies  Allergen Reactions  . Pravastatin     Numbness in feet   . Iodine Hives and Rash    CT scan contrast caused rash and itching    Past Medical History:  Diagnosis Date  . Abnormal nuclear stress test    December, 2013  . Anemia   . Axillary adenopathy 04/24/2018  . CAD (coronary artery disease)    Mild nonobstructive plaque in cath 2013  . Chest pain    December, 2013  . Cough with hemoptysis 04/22/2018  . Dizziness   . Dyslipidemia 07/14/2019  . Encounter for antineoplastic immunotherapy 05/13/2018  . Gout   .  Hemorrhoid   . Hyperlipidemia   . Hypertension   . Hypothyroidism (acquired) 08/11/2018  . 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 Obesity, unspecified 03/28/2009   Qualifier: Diagnosis of  By: [name], MD, [name]    . Palpitations 03/28/2009   Qualifier: Diagnosis of  By: [name], MD, [name]    . Right lower lobe lung mass 04/22/2018  . Skin cancer   . SNORING 03/28/2009   Qualifier: Diagnosis of  By: [name], MD, [name]    . Spinal stenosis   . 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  acetaminophen (TYLENOL) 500 MG tablet, Take 1,000 mg by mouth every 6 (six) hours as needed., Disp: , Rfl:  .  albuterol (PROVENTIL) (2.5 MG/3ML) 0.083% nebulizer solution, Inhale 3 mLs (2.5 mg total) into the lungs every 6 (six) hours as needed for wheezing or shortness of breath., Disp: 120 mL, Rfl: 11 .  Alirocumab (PRALUENT) 150 MG/ML SOAJ, Inject 150 mg into the skin every 14 (fourteen) days., Disp: 2 pen, Rfl: 11 .  ALPRAZolam (XANAX) 0.25 MG tablet, Take 1 tablet (0.25 mg total) by mouth at bedtime as needed for anxiety.,  Disp: 30 tablet, Rfl: 0 .  Artificial Tear Solution (SOOTHE XP OP), Place 1 drop into both eyes 2 (two) times daily., Disp: , Rfl:  .  carvedilol (COREG) 12.5 MG tablet, Take 1 tablet (12.5 mg total) by mouth in the morning, at noon, and at bedtime., Disp: 270 tablet, Rfl: 3 .  Cyanocobalamin (VITAMIN B 12) 250 MCG LOZG, Take by mouth., Disp: , Rfl:  .  ferrous sulfate 324 MG TBEC, Take 324 mg by mouth daily with breakfast., Disp: , Rfl:  .  fluticasone (FLONASE) 50 MCG/ACT nasal spray, Place 1 spray into both nostrils daily., Disp: 16 g, Rfl: 3 .  Fluticasone-Umeclidin-Vilant (TRELEGY ELLIPTA) 100-62.5-25 MCG/INH AEPB, Inhale 1 puff into the lungs daily., Disp: 60 each, Rfl: 6 .   levothyroxine (SYNTHROID) 175 MCG tablet, TAKE 1 TABLET BY MOUTH EVERY DAY BEFORE BREAKFAST, Disp: 30 tablet, Rfl: 1 .  lidocaine-prilocaine (EMLA) cream, Apply 1 application topically as needed., Disp: 30 g, Rfl: 1 .  methocarbamol (ROBAXIN) 500 MG tablet, Take 500 mg by mouth daily., Disp: , Rfl:  .  senna-docusate (SENNA S) 8.6-50 MG tablet, 1 to 2 twice daily for constipation, Disp: 120 tablet, Rfl: 5 Social History   Socioeconomic History  . Marital status: Married    Spouse name: Not on file  . Number of children: 1  . Years of education: Not on file  . Highest education level: Not on file  Occupational History  . Occupation: Government social research officer  Tobacco Use  . Smoking status: Former Smoker    Packs/day: 0.20    Years: 2.00    Pack years: 0.40    Types: Cigarettes    Start date: 10/01/1968  . Smokeless tobacco: Never Used  Vaping Use  . Vaping Use: Never used  Substance and Sexual Activity  . Alcohol use: No    Comment: hx of   . Drug use: No  . Sexual activity: Not on file  Other Topics Concern  . Not on file  Social History Narrative   Unable to ask intimate partner violence questions, wife present   Social Determinants of Health   Financial Resource Strain: Not on file  Food Insecurity: Not on file  Transportation Needs: Not on file  Physical Activity: Not on file  Stress: Not on file  Social Connections: Not on file  Intimate Partner Violence: Not on file   Family History  Problem Relation Age of Onset  . Heart attack Father   . Heart failure Mother   . Parkinson's disease Mother   . Healthy Sister   . Skin cancer Sister   . Neuropathy Brother   . COPD Brother   . Epilepsy Brother   . Healthy Sister   . Healthy Sister   . Colon cancer Neg Hx     Objective: Office vital signs reviewed. BP 131/78   Pulse 83   Temp (!) 96.1 F (35.6 C) (Temporal)   Ht 6\' 2"  (1.88 m)   Wt 240 lb (108.9 kg)   SpO2 96%   BMI 30.81 kg/m   Physical Examination:   General: Awake, alert, well nourished, No acute distress HEENT: Normal; sclera white.  EOMI.  PERRLA Cardio: regular rate and rhythm, S1S2 heard, no murmurs appreciated Pulm: clear to auscultation bilaterally, no wheezes, rhonchi or rales; normal work of breathing on room air Extremities: warm, well perfused, No edema, cyanosis or clubbing; +2 pulses bilaterally MSK: normal gait and station Neuro: Cranial nerves II through XII grossly intact.  Normal upper extremity and lower extremity cerebellar testing  Assessment/ Plan: 75 y.o. male   Hospital discharge follow-up  Encephalitis - Plan: doxycycline (VIBRA-TABS) 100 MG tablet, Ambulatory referral to Neurology  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HCC)  Tick bite, unspecified site, subsequent encounter - Plan: doxycycline (VIBRA-TABS) 100 MG tablet  Gastroesophageal reflux disease without esophagitis - Plan: pantoprazole (PROTONIX) 40 MG tablet  Sleep disorder - Plan: Ambulatory referral to Neurology  Balance problem - Plan: Ambulatory referral to Neurology  I reviewed his emergency department/hospital notes and recommendations.  Doxycycline extended.  If this is a true tickborne illness I think that he warrants longer treatment with doxycycline.  He has follow-up with infectious disease in 2 weeks.  Apparently there were some send off labs that have yet to result.  He will continue following up with Dr. Earlie Server with regards to lung cancer treatment.  I have placed him on 2 weeks of Protonix for GERD.  Advised that with long-term use this could cause other issues.  We will follow-up in 6 weeks with regards to this  I have referred him to neurology for sleep study.  Okay to use low-dose melatonin as needed sleep.  I agree with limiting sedating and mind altering medications at this time given recent events.  If he has ongoing balance issues at that time, hopefully they can also comment.  MRI was negative and neurologic  testing today was unremarkable.  No orders of the defined types were placed in this encounter.  No orders of the defined types were placed in this encounter.    [name], DO [place] [phone]

## 2020-12-08 NOTE — Patient Instructions (Signed)
Medication Instructions:  Continue current medications  *If you need a refill on your cardiac medications before your next appointment, please call your pharmacy*   Lab Work: Fasting Lipid and Liver  If you have labs (blood work) drawn today and your tests are completely normal, you will receive your results only by: Marland Kitchen MyChart Message (if you have MyChart) OR . A paper copy in the mail If you have any lab test that is abnormal or we need to change your treatment, we will call you to review the results.   Testing/Procedures: Your physician has requested that you have an echocardiogram. Echocardiography is a painless test that uses sound waves to create images of your heart. It provides your doctor with information about the size and shape of your heart and how well your heart's chambers and valves are working. This procedure takes approximately one hour. There are no restrictions for this procedure.   Follow-Up: At [facility], you and your health needs are our priority.  As part of our continuing mission to provide you with exceptional heart care, we have created designated Provider Care Teams.  These Care Teams include your primary Cardiologist (physician) and Advanced Practice Providers (APPs -  Physician Assistants and Nurse Practitioners) who all work together to provide you with the care you need, when you need it.  We recommend signing up for the patient portal called "MyChart".  Sign up information is provided on this After Visit Summary.  MyChart is used to connect with patients for Virtual Visits (Telemedicine).  Patients are able to view lab/test results, encounter notes, upcoming appointments, etc.  Non-urgent messages can be sent to your provider as well.   To learn more about what you can do with MyChart, go to [email or web address].    Your next appointment:   6 month(s)  The format for your next appointment:   In Person  Provider:   You may see [name], MD  or one of the following Advanced Practice Providers on your designated Care Team:    Rosaria Ferries, PA-C  Jory Sims, DNP, ANP

## 2020-12-15 ENCOUNTER — Other Ambulatory Visit: Payer: Self-pay | Admitting: *Deleted

## 2020-12-15 DIAGNOSIS — E782 Mixed hyperlipidemia: Secondary | ICD-10-CM

## 2020-12-15 DIAGNOSIS — I10 Essential (primary) hypertension: Secondary | ICD-10-CM

## 2020-12-15 DIAGNOSIS — Z5181 Encounter for therapeutic drug level monitoring: Secondary | ICD-10-CM

## 2020-12-20 ENCOUNTER — Ambulatory Visit (INDEPENDENT_AMBULATORY_CARE_PROVIDER_SITE_OTHER): Payer: Medicare Other | Admitting: Infectious Diseases

## 2020-12-20 ENCOUNTER — Encounter: Payer: Self-pay | Admitting: Family Medicine

## 2020-12-20 ENCOUNTER — Inpatient Hospital Stay: Payer: Medicare Other | Attending: Oncology | Admitting: Internal Medicine

## 2020-12-20 ENCOUNTER — Inpatient Hospital Stay: Payer: Medicare Other

## 2020-12-20 ENCOUNTER — Other Ambulatory Visit: Payer: Self-pay

## 2020-12-20 VITALS — BP 122/71 | HR 68 | Temp 97.4°F | Resp 19 | Ht 74.0 in | Wt 240.8 lb

## 2020-12-20 DIAGNOSIS — Z95828 Presence of other vascular implants and grafts: Secondary | ICD-10-CM

## 2020-12-20 DIAGNOSIS — Z923 Personal history of irradiation: Secondary | ICD-10-CM | POA: Insufficient documentation

## 2020-12-20 DIAGNOSIS — E039 Hypothyroidism, unspecified: Secondary | ICD-10-CM | POA: Insufficient documentation

## 2020-12-20 DIAGNOSIS — Z9221 Personal history of antineoplastic chemotherapy: Secondary | ICD-10-CM | POA: Diagnosis not present

## 2020-12-20 DIAGNOSIS — C7802 Secondary malignant neoplasm of left lung: Secondary | ICD-10-CM | POA: Insufficient documentation

## 2020-12-20 DIAGNOSIS — C3401 Malignant neoplasm of right main bronchus: Secondary | ICD-10-CM | POA: Insufficient documentation

## 2020-12-20 DIAGNOSIS — E785 Hyperlipidemia, unspecified: Secondary | ICD-10-CM | POA: Insufficient documentation

## 2020-12-20 DIAGNOSIS — R5383 Other fatigue: Secondary | ICD-10-CM

## 2020-12-20 DIAGNOSIS — I2510 Atherosclerotic heart disease of native coronary artery without angina pectoris: Secondary | ICD-10-CM

## 2020-12-20 DIAGNOSIS — Z79899 Other long term (current) drug therapy: Secondary | ICD-10-CM | POA: Insufficient documentation

## 2020-12-20 DIAGNOSIS — C3490 Malignant neoplasm of unspecified part of unspecified bronchus or lung: Secondary | ICD-10-CM | POA: Diagnosis not present

## 2020-12-20 DIAGNOSIS — Z5112 Encounter for antineoplastic immunotherapy: Secondary | ICD-10-CM

## 2020-12-20 DIAGNOSIS — G0490 Encephalitis and encephalomyelitis, unspecified: Secondary | ICD-10-CM

## 2020-12-20 DIAGNOSIS — C7801 Secondary malignant neoplasm of right lung: Secondary | ICD-10-CM

## 2020-12-20 DIAGNOSIS — C3491 Malignant neoplasm of unspecified part of right bronchus or lung: Secondary | ICD-10-CM

## 2020-12-20 DIAGNOSIS — I10 Essential (primary) hypertension: Secondary | ICD-10-CM | POA: Diagnosis not present

## 2020-12-20 LAB — CBC WITH DIFFERENTIAL (CANCER CENTER ONLY)
Abs Immature Granulocytes: 0.04 10*3/uL (ref 0.00–0.07)
Basophils Absolute: 0 10*3/uL (ref 0.0–0.1)
Basophils Relative: 1 %
Eosinophils Absolute: 0.1 10*3/uL (ref 0.0–0.5)
Eosinophils Relative: 2 %
HCT: 34.1 % — ABNORMAL LOW (ref 39.0–52.0)
Hemoglobin: 11.5 g/dL — ABNORMAL LOW (ref 13.0–17.0)
Immature Granulocytes: 1 %
Lymphocytes Relative: 25 %
Lymphs Abs: 1.1 10*3/uL (ref 0.7–4.0)
MCH: 32.1 pg (ref 26.0–34.0)
MCHC: 33.7 g/dL (ref 30.0–36.0)
MCV: 95.3 fL (ref 80.0–100.0)
Monocytes Absolute: 0.7 10*3/uL (ref 0.1–1.0)
Monocytes Relative: 15 %
Neutro Abs: 2.5 10*3/uL (ref 1.7–7.7)
Neutrophils Relative %: 56 %
Platelet Count: 139 10*3/uL — ABNORMAL LOW (ref 150–400)
RBC: 3.58 MIL/uL — ABNORMAL LOW (ref 4.22–5.81)
RDW: 12.7 % (ref 11.5–15.5)
WBC Count: 4.5 10*3/uL (ref 4.0–10.5)
nRBC: 0 % (ref 0.0–0.2)

## 2020-12-20 LAB — CMP (CANCER CENTER ONLY)
ALT: 9 U/L (ref 0–44)
AST: 14 U/L — ABNORMAL LOW (ref 15–41)
Albumin: 3.8 g/dL (ref 3.5–5.0)
Alkaline Phosphatase: 50 U/L (ref 38–126)
Anion gap: 10 (ref 5–15)
BUN: 22 mg/dL (ref 8–23)
CO2: 22 mmol/L (ref 22–32)
Calcium: 9.1 mg/dL (ref 8.9–10.3)
Chloride: 110 mmol/L (ref 98–111)
Creatinine: 1.03 mg/dL (ref 0.61–1.24)
GFR, Estimated: >60 mL/min (ref >60)
Glucose, Bld: 108 mg/dL — ABNORMAL HIGH (ref 70–99)
Potassium: 4.1 mmol/L (ref 3.5–5.1)
Sodium: 142 mmol/L (ref 135–145)
Total Bilirubin: 0.7 mg/dL (ref 0.3–1.2)
Total Protein: 6.9 g/dL (ref 6.5–8.1)

## 2020-12-20 LAB — TSH: TSH: 2.406 u[IU]/mL (ref 0.320–4.118)

## 2020-12-20 MED ORDER — HEPARIN SOD (PORK) LOCK FLUSH 100 UNIT/ML IV SOLN
500.0000 [IU] | Freq: Once | INTRAVENOUS | Status: AC
  Administered 2020-12-20: 500 [IU]
  Filled 2020-12-20: qty 5

## 2020-12-20 MED ORDER — SODIUM CHLORIDE 0.9% FLUSH
10.0000 mL | Freq: Once | INTRAVENOUS | Status: AC
  Administered 2020-12-20: 10 mL
  Filled 2020-12-20: qty 10

## 2020-12-20 NOTE — Patient Instructions (Signed)
OK to stop the doxycycline - if this was caused by any tick related condition you have had a long enough course of treatment.   All the testing for tick related conditions returned negative - this is not all inclusive, but your treatment would be complete for this.   My question for Dr. Neil Crouch and Neurology would be if we need to consider some steroids for you or is stopping the Keytruda enough.   I would consider trying an antihistamine to see if that helps with some of the congestion/sinus symptoms in addition to the flonase. It may help with some of the brain fog also.   I vote yes for the 4th covid booster.   I will be in touch with you over the phone Deloris Ping

## 2020-12-20 NOTE — Progress Notes (Signed)
Patient: Travis Padilla  DOB: 04/23/1946 MRN: [id number] PCP: Travis Norlander, DO    Subjective:  Travis Padilla is a 75 y.o. male here for hospital follow up after onset of encephalitis with unclear etiology. He underwent MRI of the brain (normal without any meningeal enhancement or mass), LP more c/w aseptic picture with 50 WBCs (1% neutrophils), normal protein/glucose. Consideration for episode was due to tick borne illness vs Keytruda side effect. He was started on empiric antimicrobials, antiviral and doxycycline with improvement in his condition and discharged on Doxycycline to complete 10 days with presumption for erlichia - this was extended out for 2 more weeks until his follow up with [place].   Has had some ongoing headaches, disorientation and brain fog since discharge. His balance is "reasonable" but a little degraded from prior to this event. He is able to participate in easy yoga sessions a few times a week now. Still having some congestion in nose. Normally he is not able to take any antihistamines due to intolerances to these medications but open to considering another try of them. Noticed in the periphery of the right eye some "bright colored floaters."   Has had some trouble with reflux / heart burn that seemed to calm with PPIs. No new concerns aside from trying to figure out what was the cause of his recent problem.  His wife joins him today for the visit.    Review of Systems  Constitutional: Negative for chills and fever.       "foggy headed"  HENT: Positive for congestion. Negative for ear pain, hearing loss and tinnitus.   Eyes: Negative for blurred vision and double vision.       Floaters   Respiratory: Negative for cough and shortness of breath.   Cardiovascular: Negative for chest pain and leg swelling.  Gastrointestinal: Positive for heartburn. Negative for abdominal pain, constipation, diarrhea and vomiting.  Musculoskeletal: Negative for joint pain and neck  pain.  Skin: Negative for itching and rash.  Neurological: Positive for headaches. Negative for dizziness, tingling, loss of consciousness and weakness.     Past Medical History:  Diagnosis Date  . Abnormal nuclear stress test    December, 2013  . Anemia   . Axillary adenopathy 04/24/2018  . CAD (coronary artery disease)    Mild nonobstructive plaque in cath 2013  . Chest pain    December, 2013  . Cough with hemoptysis 04/22/2018  . Dizziness   . Dyslipidemia 07/14/2019  . Encounter for antineoplastic immunotherapy 05/13/2018  . Gout   . Hemorrhoid   . Hyperlipidemia   . Hypertension   . Hypothyroidism (acquired) 08/11/2018  . 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 Obesity, unspecified 03/28/2009   Qualifier: Diagnosis of  By: [name], MD, [name]    . Palpitations 03/28/2009   Qualifier: Diagnosis of  By: [name], MD, [name]    . Right lower lobe lung mass 04/22/2018  . Skin cancer   . SNORING 03/28/2009   Qualifier: Diagnosis of  By: [name], MD, [name]    . Spinal stenosis   . 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 doxycycline (VIBRA-TABS) 100 MG tablet Take 1 tablet (100 mg total) by mouth 2 (  two) times daily for 14 days. 28 tablet 0  . acetaminophen (TYLENOL) 500 MG tablet Take 1,000 mg by mouth every 6 (six) hours as needed.    . Alirocumab (PRALUENT) 150 MG/ML SOAJ Inject 150 mg into the skin every 14 (fourteen) days. 2 pen 11  . ALPRAZolam (XANAX) 0.25 MG tablet Take 1 tablet (0.25 mg total) by mouth at bedtime as needed for anxiety. (Patient not taking: Reported on 01/17/2021) 30 tablet 0  . Artificial Tear Solution (SOOTHE XP OP) Place 1 drop into both eyes 2 (two) times daily.    . fluticasone (FLONASE) 50 MCG/ACT nasal spray  Place 1 spray into both nostrils daily. 16 g 3  . Fluticasone-Umeclidin-Vilant (TRELEGY ELLIPTA) 100-62.5-25 MCG/INH AEPB Inhale 1 puff into the lungs daily. 60 each 6  . lidocaine-prilocaine (EMLA) cream Apply 1 application topically as needed. 30 g 1  . methocarbamol (ROBAXIN) 500 MG tablet Take 500 mg by mouth daily.    Marland Kitchen senna-docusate (SENNA S) 8.6-50 MG tablet 1 to 2 twice daily for constipation 120 tablet 5  . albuterol (PROVENTIL) (2.5 MG/3ML) 0.083% nebulizer solution Inhale 3 mLs (2.5 mg total) into the lungs every 6 (six) hours as needed for wheezing or shortness of breath. (Patient not taking: Reported on 01/12/2021) 120 mL 11  . carvedilol (COREG) 12.5 MG tablet Take 1 tablet (12.5 mg total) by mouth in the morning, at noon, and at bedtime. 270 tablet 3  . Cyanocobalamin (VITAMIN B 12) 250 MCG LOZG Take by mouth.    . doxycycline (VIBRA-TABS) 100 MG tablet TAKE 1 TABLET (100 MG TOTAL) BY MOUTH EVERY TWELVE HOURS. 12 tablet 0  . ferrous sulfate 324 MG TBEC Take 324 mg by mouth daily with breakfast.    . levothyroxine (SYNTHROID) 175 MCG tablet TAKE 1 TABLET BY MOUTH EVERY DAY BEFORE BREAKFAST 30 tablet 1  . pantoprazole (PROTONIX) 40 MG tablet Take 1 tablet (40 mg total) by mouth daily. x2 weeks per flareup of reflux 30 tablet 1   No facility-administered medications prior to visit.     Allergies  Allergen Reactions  . Pravastatin     Numbness in feet   . Iodine Hives and Rash    CT scan contrast caused rash and itching     Social History   Tobacco Use  . Smoking status: Former Smoker    Packs/day: 0.20    Years: 2.00    Pack years: 0.40    Types: Cigarettes    Start date: 10/01/1968  . Smokeless tobacco: Never Used  Vaping Use  . Vaping Use: Never used  Substance Use Topics  . Alcohol use: No    Comment: hx of   . Drug use: No    Family History  Problem Relation Age of Onset  . Heart attack Father   . Heart failure Mother   . Parkinson's disease Mother   .  Healthy Sister   . Skin cancer Sister   . Neuropathy Brother   . COPD Brother   . Epilepsy Brother   . Healthy Sister   . Healthy Sister   . Colon cancer Neg Hx     Objective:   Vitals:   12/20/20 1534  BP: 117/66  Pulse: 79  Weight: 240 lb (108.9 kg)   Body mass index is 30.81 kg/m.  Physical Exam Vitals reviewed.  Constitutional:      Appearance: Normal appearance. He is not ill-appearing.  HENT:     Head: Normocephalic.  Mouth/Throat:     Mouth: Mucous membranes are moist.     Pharynx: Oropharynx is clear.  Eyes:     General: No scleral icterus. Cardiovascular:     Rate and Rhythm: Normal rate.  Pulmonary:     Effort: Pulmonary effort is normal.  Musculoskeletal:        General: Normal range of motion.     Cervical back: Normal range of motion.  Skin:    Coloration: Skin is not jaundiced or pale.  Neurological:     Mental Status: He is alert and oriented to person, place, and time.  Psychiatric:        Mood and Affect: Mood normal.        Judgment: Judgment normal.     Lab Results: Lab Results  Component Value Date   WBC 5.6 01/16/2021   HGB 12.4 (L) 01/16/2021   HCT 36.6 (L) 01/16/2021   MCV 93.8 01/16/2021   PLT 159 01/16/2021    Lab Results  Component Value Date   CREATININE 0.95 01/16/2021   BUN 17 01/16/2021   NA 141 01/16/2021   K 4.6 01/16/2021   CL 107 01/16/2021   CO2 24 01/16/2021    Lab Results  Component Value Date   ALT 12 01/16/2021   AST 16 01/16/2021   ALKPHOS 56 01/16/2021   BILITOT 0.7 01/16/2021     Assessment & Plan:   Problem List Items Addressed This Visit      Unprioritized   Encephalitis    Hospitalized for acute onset encephalitis with fever. He had improvement after 48 hours on doxycycyline in addition to conventional bacterial coverage and acyclovir. Lumbar puncture done a few days on antibiotics revealed aseptic picture with 50 WBCs, 1% neutrophils. Normal protein/glucose. HSV and tick related  PCRs/serologies were all negative. (RMSF actually never did run properly).   Ehrlichia PCRs from blood and CSF were both negative - given this was done after a few weeks of feeling ill I feel that it would have been a reliable time frame for this to be done and interpreted accurately.   It is not clear the cause of his encephalitis. I am not convinced this is tick borne. He can stop the doxycycline as he has had nearly 3 weeks of treatment which would be enough for CNS related tick illnesses. Plus I feel it is contributing to some GERD/esophagitis symptoms for him.   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 He saw oncology team today and they are going to hold [place] for now in light of these recent events.   Possible viral given LP indices and prodrome described of viral illnesses leading up to this event.   FU with neurology as scheduled. I will notify him of his blood test results drawn today - knowing that they don't necessarily point to this as the full explanation of recent illness.          Janene Madeira, MSN, NP-C [facility] [place] Pager: [phone] Office: [phone]

## 2020-12-20 NOTE — Patient Instructions (Signed)

## 2020-12-20 NOTE — Progress Notes (Signed)
[facility] [phone]   [phone]  OFFICE PROGRESS NOTE  Travis Norlander, DO [place] [address]  DIAGNOSIS: Stage IV (T3, N0, M1c) non-small cell lung cancer, squamous cell carcinoma presented with large right infrahilar mass in addition to left upper lobe lung nodule as well as left axillary mass with left axillary lymph node diagnosed in August 2019.  PRIOR THERAPY:  1) Palliative radiotherapy to the right infrahilar mass as well as the axillary mass under the care of Dr. Lisbeth Padilla. 2) Systemic chemotherapy with carboplatin for AUC of 5, paclitaxel 175 mg/M2 and Keytruda 200 mg IV every 3 weeks status post 4 cycles.  CURRENT THERAPY: Maintenance immunotherapy with single agent Keytruda 200 mg IV every 3 weeks status post 41 cycles.  INTERVAL HISTORY: Travis Padilla 75 y.o. male returns to the clinic today for follow-up visit.  The patient is feeling fine today with no concerning complaints except for fatigue.  He was admitted to [facility] recently with encephalitis of unclear etiology but was thought to be related to a tick bite.  He was treated with doxycycline and he has improvement of his condition.  He missed his last treatment with immunotherapy.  He denied having any current chest pain, shortness of breath, cough or hemoptysis.  He denied having any nausea, vomiting, diarrhea or constipation.  He has no weight loss or night sweats.  He has no fever or chills.  He is here today for evaluation before resuming his treatment with immunotherapy.  MEDICAL HISTORY: Past Medical History:  Diagnosis Date  . Abnormal nuclear stress test    December, 2013  . Anemia   . Axillary adenopathy 04/24/2018  . CAD (coronary artery disease)    Mild nonobstructive plaque in cath 2013  . Chest pain    December, 2013  . Cough with hemoptysis 04/22/2018  . Dizziness   . Dyslipidemia 07/14/2019  . Encounter for antineoplastic  immunotherapy 05/13/2018  . Gout   . Hemorrhoid   . Hyperlipidemia   . Hypertension   . Hypothyroidism (acquired) 08/11/2018  . 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 Obesity, unspecified 03/28/2009   Qualifier: Diagnosis of  By: Travis Spanish, MD, Travis Padilla    . Palpitations 03/28/2009   Qualifier: Diagnosis of  By: Travis Spanish, MD, Travis Padilla    . Right lower lobe lung mass 04/22/2018  . Skin cancer   . SNORING 03/28/2009   Qualifier: Diagnosis of  By: Travis Spanish, MD, Travis Padilla    . Spinal stenosis   . 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[facility]) 05/13/2018    ALLERGIES:  is allergic to pravastatin and iodine.  MEDICATIONS:  Current Outpatient Medications  Medication Sig Dispense Refill  . acetaminophen (TYLENOL) 500 MG tablet Take 1,000 mg by mouth every 6 (six) hours as needed.    Marland Kitchen albuterol (PROVENTIL) (2.5 MG/3ML) 0.083% nebulizer solution Inhale 3 mLs (2.5 mg total) into the lungs every 6 (six) hours as needed for wheezing or shortness of breath. 120 mL 11  . Alirocumab (PRALUENT) 150 MG/ML SOAJ Inject 150 mg into the skin every 14 (fourteen) days. 2 pen 11  . ALPRAZolam (XANAX) 0.25 MG tablet Take 1 tablet (0.25 mg total) by mouth at bedtime as needed for anxiety. 30 tablet 0  .  Artificial Tear Solution (SOOTHE XP OP) Place 1 drop into both eyes 2 (two) times daily.    . carvedilol (COREG) 12.5 MG tablet Take 1 tablet (12.5 mg total) by mouth in the morning, at noon, and at bedtime. 270 tablet 3  . Cyanocobalamin (VITAMIN B 12) 250 MCG LOZG Take by mouth.    . doxycycline (VIBRA-TABS) 100 MG tablet Take 1 tablet (100 mg total) by mouth 2 (two) times daily for 14 days. 28 tablet 0  . doxycycline (VIBRA-TABS) 100 MG tablet TAKE 1 TABLET (100 MG TOTAL) BY MOUTH EVERY TWELVE HOURS. 12 tablet 0  . ferrous sulfate 324 MG TBEC Take 324 mg by mouth daily  with breakfast.    . fluticasone (FLONASE) 50 MCG/ACT nasal spray Place 1 spray into both nostrils daily. 16 g 3  . Fluticasone-Umeclidin-Vilant (TRELEGY ELLIPTA) 100-62.5-25 MCG/INH AEPB Inhale 1 puff into the lungs daily. 60 each 6  . levothyroxine (SYNTHROID) 175 MCG tablet TAKE 1 TABLET BY MOUTH EVERY DAY BEFORE BREAKFAST 30 tablet 1  . lidocaine-prilocaine (EMLA) cream Apply 1 application topically as needed. 30 g 1  . methocarbamol (ROBAXIN) 500 MG tablet Take 500 mg by mouth daily.    . pantoprazole (PROTONIX) 40 MG tablet Take 1 tablet (40 mg total) by mouth daily. x2 weeks per flareup of reflux 30 tablet 1  . senna-docusate (SENNA S) 8.6-50 MG tablet 1 to 2 twice daily for constipation 120 tablet 5   No current facility-administered medications for this visit.    SURGICAL HISTORY:  Past Surgical History:  Procedure Laterality Date  . 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 BELPHAROPTOSIS REPAIR Bilateral   . CATARACT EXTRACTION W/ INTRAOCULAR LENS  IMPLANT, BILATERAL    . COLONOSCOPY N/A 11/03/2014   Procedure: COLONOSCOPY;  Surgeon: Rogene Houston, MD;  Location: [facility];  Service: Endoscopy;  Laterality: N/A;  1225  . IR CV LINE INJECTION  08/24/2020  . IR FLUORO GUIDED NEEDLE PLC ASPIRATION/INJECTION LOC  11/28/2020  . IR IMAGING GUIDED PORT INSERTION  07/11/2018  . KNEE CARTILAGE SURGERY Left    Left knee  . SKIN CANCER EXCISION  12/2014, 04/25/15  . VIDEO BRONCHOSCOPY Bilateral 05/09/2018   Procedure: VIDEO BRONCHOSCOPY WITH FLUORO;  Surgeon: Travis Doom, MD;  Location: [facility];  Service: Cardiopulmonary;  Laterality: Bilateral;    REVIEW OF SYSTEMS:  Constitutional: positive for fatigue Eyes: negative Ears, nose, mouth, throat, and face: negative Respiratory: negative Cardiovascular: negative Gastrointestinal: negative Genitourinary:negative Integument/breast: negative Hematologic/lymphatic: negative Musculoskeletal:negative Neurological:  negative Behavioral/Psych: negative Endocrine: negative Allergic/Immunologic: negative   PHYSICAL EXAMINATION: General appearance: alert, cooperative, fatigued and no distress Head: Normocephalic, without obvious abnormality, atraumatic Neck: no adenopathy, no JVD, supple, symmetrical, trachea midline and thyroid not enlarged, symmetric, no tenderness/mass/nodules Lymph nodes: Cervical, supraclavicular, and axillary nodes normal. Resp: clear to auscultation bilaterally Back: symmetric, no curvature. ROM normal. No CVA tenderness. Cardio: regular rate and rhythm, S1, S2 normal, no murmur, click, rub or gallop GI: soft, non-tender; bowel sounds normal; no masses,  no organomegaly Extremities: extremities normal, atraumatic, no cyanosis or edema Neurologic: Alert and oriented X 3, normal strength and tone. Normal symmetric reflexes. Normal coordination and gait  ECOG PERFORMANCE STATUS: 1 - Symptomatic but completely ambulatory  Blood pressure 122/71, pulse 68, temperature (!) 97.4 F (36.3 C), temperature source Tympanic, resp. rate 19, height 6\' 2"  (1.88 m), weight 240 lb 12.8 oz (109.2 kg), SpO2 100 %.  LABORATORY DATA: Lab Results  Component  Value Date   WBC 4.5 12/20/2020   HGB 11.5 (L) 12/20/2020   HCT 34.1 (L) 12/20/2020   MCV 95.3 12/20/2020   PLT 139 (L) 12/20/2020      Chemistry      Component Value Date/Time   NA 140 12/02/2020 0106   NA 144 04/09/2018 1507   K 3.5 12/02/2020 0106   CL 110 12/02/2020 0106   CO2 23 12/02/2020 0106   BUN 13 12/02/2020 0106   BUN 15 04/09/2018 1507   CREATININE 1.06 12/02/2020 0106   CREATININE 1.01 11/08/2020 1011      Component Value Date/Time   CALCIUM 8.9 12/02/2020 0106   ALKPHOS 47 12/08/2020 1156   AST 17 12/08/2020 1156   AST 11 (L) 11/08/2020 1011   ALT 16 12/08/2020 1156   ALT 16 11/08/2020 1011   BILITOT 0.4 12/08/2020 1156   BILITOT 0.6 11/08/2020 1011       RADIOGRAPHIC STUDIES: CT Head Wo Contrast  Result  Date: 11/26/2020 CLINICAL DATA:  Confusion EXAM: CT HEAD WITHOUT CONTRAST TECHNIQUE: Contiguous axial images were obtained from the base of the skull through the vertex without intravenous contrast. COMPARISON:  08/19/2018 FINDINGS: Brain: There is atrophy and chronic small vessel disease changes. No acute intracranial abnormality. Specifically, no hemorrhage, hydrocephalus, mass lesion, acute infarction, or significant intracranial injury. Vascular: No hyperdense vessel or unexpected calcification. Skull: No acute calvarial abnormality. Sinuses/Orbits: Visualized paranasal sinuses and mastoids clear. Orbital soft tissues unremarkable. Other: None IMPRESSION: Atrophy, chronic microvascular disease. No acute intracranial abnormality. Electronically Signed   By: Rolm Baptise M.D.   On: 11/26/2020 20:13   MR BRAIN W WO CONTRAST  Result Date: 11/29/2020 CLINICAL DATA:  Stroke, follow-up. Rule out infection. History of stage IV squamous cell carcinoma of the lung. EXAM: MRI HEAD WITHOUT AND WITH CONTRAST TECHNIQUE: Multiplanar, multiecho pulse sequences of the brain and surrounding structures were obtained without and with intravenous contrast. CONTRAST:  17mL GADAVIST GADOBUTROL 1 MMOL/ML IV SOLN COMPARISON:  Head CT November 26, 2020 FINDINGS: The study is partially degraded by motion, particularly post-contrast images. Brain: No acute infarction, hemorrhage, extra-axial collection or mass lesion. Few scattered foci of T2 hyperintensity are seen within the white matter of the cerebral hemispheres, nonspecific, most likely related to mild chronic microangiopathic changes. 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 Prominence of the supratentorial ventricles with relative preservation of the high convexity sulci which may be seen in the setting of normal pressure hydrocephalus in the appropriate clinical scenario. Postcontrast images are significantly degraded by motion. No  gross abnormal contrast enhancement. Vascular: Normal flow voids. Skull and upper cervical spine: Normal marrow signal. Sinuses/Orbits: Mild mucosal thickening of the sphenoid sinuses. Bilateral lens surgery. IMPRESSION: 1. Motion degraded study. 2. No acute intracranial abnormality. 3. Prominence of the supratentorial ventricles with relative preservation of the high convexity sulci which may be seen in the setting of normal pressure hydrocephalus in the appropriate clinical scenario. 4. Mild chronic microangiopathic changes. 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 Electronically Signed   By: Pedro Earls M.D.   On: 11/29/2020 13:19   IR Fluoro Guide Ndl Plmt / BX  Result Date: 11/28/2020 INDICATION: 75 year old with altered mental status. History of lung cancer. Request for fluoroscopic guided lumbar puncture with sedation. EXAM: LUMBAR PUNCTURE WITH FLUOROSCOPIC GUIDANCE MEDICATIONS: Moderate sedation ANESTHESIA/SEDATION: Fentanyl 25 mcg IV; Versed 1.0 mg IV Moderate Sedation Time:  24 minutes  The patient was continuously monitored during the procedure by the interventional radiology nurse under my direct supervision. COMPLICATIONS: None immediate. PROCEDURE: Informed consent was obtained for a lumbar puncture. Patient was placed prone on the interventional table. The L3-L4 level was targeted. Overlying skin was prepped with Betadine and sterile field was created. Skin was anesthetized using 1% lidocaine. 20 gauge spinal needle was directed into the thecal sac using fluoroscopic guidance. Clear CSF was identified. Opening pressure was 21 cm. Approximately 10 mL of fluid was collected. Needle was removed without complication. Bandage placed over the puncture site. Fluoroscopic images were taken and saved for this procedure. FINDINGS: Opening pressure was 21 cm. Approximately 10 mL of clear CSF was removed. IMPRESSION: Successful lumbar puncture with  fluoroscopic guidance. Electronically Signed   By: Markus Daft M.D.   On: 11/28/2020 16:27   DG Chest Port 1 View  Result Date: 11/26/2020 CLINICAL DATA:  Questionable sepsis, fever, altered mental status EXAM: PORTABLE CHEST 1 VIEW COMPARISON:  09/24/2017 FINDINGS: Cardiomegaly, vascular congestion. Perihilar opacities could reflect atelectasis, infiltrates or edema. Right Port-A-Cath remains in place, unchanged. No effusions or acute bony abnormality. IMPRESSION: Cardiomegaly with vascular congestion. Perihilar opacities could reflect edema, atelectasis or infiltrates. Electronically Signed   By: Rolm Baptise M.D.   On: 11/26/2020 19:54    ASSESSMENT AND PLAN: This is a very pleasant 75 years old white male with very light most smoking history recently diagnosed with stage IV (T3, N0, M1c) non-small cell lung cancer, squamous cell carcinoma based on the biopsy from the left axillary mass. He status post 4 cycles of induction systemic chemotherapy with carboplatin, paclitaxel and Keytruda with partial response.  The patient is currently on maintenance treatment with single agent Keytruda status post 41 cycles. The patient has been tolerating this treatment well but recently was admitted to the hospital with encephalitis of unclear etiology could be related to a tick bite but also immunotherapy mediated encephalitis could not be excluded at this point. I recommended for the patient to hold any further of treatment of immunotherapy for now.  I will see him back for follow-up visit in 1 months for evaluation with repeat CT scan of the chest, abdomen pelvis for restaging of his disease. For the hypothyroidism, he will continue his treatment with levothyroxine and will adjust his dose according to his TSH. For the hypertension he is currently followed by cardiology and he was advised to continue with his current blood pressure medication. The patient was advised to call immediately if he has any other  concerning symptoms in the interval. The patient voices understanding of current disease status and treatment options and is in agreement with the current care plan. All questions were answered. The patient knows to call the clinic with any problems, questions or concerns. We can certainly see the patient much sooner if necessary.  Disclaimer: This note was dictated with voice recognition software. Similar sounding words can inadvertently be transcribed and may not be corrected upon review.

## 2020-12-20 NOTE — Assessment & Plan Note (Addendum)
Hospitalized for acute onset encephalitis with fever. He had improvement after 48 hours on doxycycyline in addition to conventional bacterial coverage and acyclovir. Lumbar puncture done a few days on antibiotics revealed aseptic picture with 50 WBCs, 1% neutrophils. Normal protein/glucose. HSV and tick related PCRs/serologies were all negative. (RMSF actually never did run properly).   Ehrlichia PCRs from blood and CSF were both negative - given this was done after a few weeks of feeling ill I feel that it would have been a reliable time frame for this to be done and interpreted accurately.   It is not clear the cause of his encephalitis. I am not convinced this is tick borne. He can stop the doxycycline as he has had nearly 3 weeks of treatment which would be enough for CNS related tick illnesses. Plus I feel it is contributing to some GERD/esophagitis symptoms for him.   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 He saw oncology team today and they are going to hold Hope for now in light of these recent events.   Possible viral given LP indices and prodrome described of viral illnesses leading up to this event.   FU with neurology as scheduled. I will notify him of his blood test results drawn today - knowing that they don't necessarily point to this as the full explanation of recent illness.

## 2020-12-21 ENCOUNTER — Inpatient Hospital Stay: Payer: Medicare Other | Admitting: Infectious Diseases

## 2020-12-21 ENCOUNTER — Other Ambulatory Visit: Payer: Self-pay

## 2020-12-21 ENCOUNTER — Telehealth: Payer: Self-pay | Admitting: Internal Medicine

## 2020-12-21 ENCOUNTER — Telehealth: Payer: Self-pay | Admitting: Cardiology

## 2020-12-21 ENCOUNTER — Encounter: Payer: Self-pay | Admitting: Family Medicine

## 2020-12-21 MED ORDER — CARVEDILOL 12.5 MG PO TABS: 12.5000 mg | ORAL_TABLET | Freq: Three times a day (TID) | ORAL | 3 refills | Status: DC

## 2020-12-21 NOTE — Telephone Encounter (Signed)
PT is callling to confirm if he can complete his labs at [facility] in [place] to save him a one hour drive to [facility].Please advise

## 2020-12-21 NOTE — Telephone Encounter (Signed)
Coreg given yesterday - could he have both together?

## 2020-12-21 NOTE — Telephone Encounter (Signed)
Scheduled per los. Called and left msg. Mailed printout  °

## 2020-12-21 NOTE — Telephone Encounter (Signed)
Spoke to patient-he is unsure why he needs repeat labs when he just had them completed.   Per results:    [name], MD  12/13/2020 12:32 PM EDT      LDL not quite at target but much better than before Repatha. I would like to repeat this in 10 weeks as he recovers from recent illnesses to getter a better new baseline. Call Mr. Mulkern with the results and send results to [name], DO   Patient aware of results and recommendations.  Aware he can go to any [facility] location.   Patient verbalized understanding.

## 2020-12-27 ENCOUNTER — Encounter: Payer: Self-pay | Admitting: Cardiology

## 2020-12-28 ENCOUNTER — Other Ambulatory Visit: Payer: Self-pay | Admitting: Internal Medicine

## 2020-12-28 DIAGNOSIS — C3491 Malignant neoplasm of unspecified part of right bronchus or lung: Secondary | ICD-10-CM

## 2020-12-30 ENCOUNTER — Other Ambulatory Visit: Payer: Self-pay | Admitting: Family Medicine

## 2020-12-30 DIAGNOSIS — K219 Gastro-esophageal reflux disease without esophagitis: Secondary | ICD-10-CM

## 2021-01-04 DIAGNOSIS — L821 Other seborrheic keratosis: Secondary | ICD-10-CM | POA: Diagnosis not present

## 2021-01-04 DIAGNOSIS — L570 Actinic keratosis: Secondary | ICD-10-CM | POA: Diagnosis not present

## 2021-01-04 DIAGNOSIS — L578 Other skin changes due to chronic exposure to nonionizing radiation: Secondary | ICD-10-CM | POA: Diagnosis not present

## 2021-01-10 ENCOUNTER — Ambulatory Visit: Payer: Medicare Other

## 2021-01-10 ENCOUNTER — Ambulatory Visit (HOSPITAL_COMMUNITY): Payer: Medicare Other | Attending: Cardiovascular Disease

## 2021-01-10 ENCOUNTER — Other Ambulatory Visit: Payer: Self-pay

## 2021-01-10 ENCOUNTER — Ambulatory Visit: Payer: Medicare Other | Admitting: Internal Medicine

## 2021-01-10 ENCOUNTER — Other Ambulatory Visit: Payer: Medicare Other

## 2021-01-10 DIAGNOSIS — R002 Palpitations: Secondary | ICD-10-CM | POA: Diagnosis not present

## 2021-01-10 DIAGNOSIS — R0600 Dyspnea, unspecified: Secondary | ICD-10-CM | POA: Diagnosis not present

## 2021-01-10 DIAGNOSIS — I2510 Atherosclerotic heart disease of native coronary artery without angina pectoris: Secondary | ICD-10-CM | POA: Diagnosis not present

## 2021-01-10 DIAGNOSIS — R0609 Other forms of dyspnea: Secondary | ICD-10-CM

## 2021-01-10 LAB — ECHOCARDIOGRAM COMPLETE
Area-P 1/2: 3.82 cm2
S' Lateral: 3.7 cm

## 2021-01-10 MED ORDER — PERFLUTREN LIPID MICROSPHERE
1.0000 mL | INTRAVENOUS | Status: AC | PRN
  Administered 2021-01-10: 2 mL via INTRAVENOUS

## 2021-01-12 ENCOUNTER — Ambulatory Visit (INDEPENDENT_AMBULATORY_CARE_PROVIDER_SITE_OTHER): Payer: Medicare Other | Admitting: Neurology

## 2021-01-12 ENCOUNTER — Encounter: Payer: Self-pay | Admitting: Neurology

## 2021-01-12 VITALS — BP 106/66 | HR 74 | Ht 74.0 in | Wt 234.0 lb

## 2021-01-12 DIAGNOSIS — G479 Sleep disorder, unspecified: Secondary | ICD-10-CM

## 2021-01-12 DIAGNOSIS — R0681 Apnea, not elsewhere classified: Secondary | ICD-10-CM

## 2021-01-12 DIAGNOSIS — R519 Headache, unspecified: Secondary | ICD-10-CM

## 2021-01-12 DIAGNOSIS — R0683 Snoring: Secondary | ICD-10-CM

## 2021-01-12 DIAGNOSIS — Z820 Family history of epilepsy and other diseases of the nervous system: Secondary | ICD-10-CM | POA: Diagnosis not present

## 2021-01-12 DIAGNOSIS — Z9189 Other specified personal risk factors, not elsewhere classified: Secondary | ICD-10-CM | POA: Diagnosis not present

## 2021-01-12 DIAGNOSIS — I2510 Atherosclerotic heart disease of native coronary artery without angina pectoris: Secondary | ICD-10-CM

## 2021-01-12 DIAGNOSIS — Z8669 Personal history of other diseases of the nervous system and sense organs: Secondary | ICD-10-CM

## 2021-01-12 NOTE — Patient Instructions (Signed)

## 2021-01-12 NOTE — Progress Notes (Signed)
Subjective:    Patient ID: Travis Padilla is a 75 y.o. male.  HPI     Star Age, MD, PhD [facility] [address] [place], [place] [address]  Dear Dr. Lajuana Ripple,  I saw your patient, Travis Padilla, upon your kind request in the sleep clinic today for initial consultation of his sleep disorder, in particular, concern for underlying obstructive sleep apnea.  The patient is unaccompanied today.  As you know, Mr. [name] is a 75 year old right-handed gentleman with an underlying complex medical history of coronary artery disease, hypertension, hyperlipidemia, hypothyroidism, metastatic lung cancer, spinal stenosis, squamous cell cancer, gout, hospitalization in March 2022 for encephalopathy/encephalitis, suspected to be from tickborne illness, and borderline obesity, who reports snoring and restless sleep, sleep disruption, difficulty initiating and maintaining sleep.  He has a history of snoring but has not had any recent report of snoring, previously, in the past, his wife had also noticed pauses in his breathing.  He is a restless sleeper but does not endorse telltale restless leg symptoms.  Sometimes he has discomfort in his right foot, he had an injury to the right foot about a year ago.   He quit smoking in his 53s.  He does not currently drink any alcohol.  He drinks caffeine in the form of coffee, generally 1 cup in the morning.  He goes to bed generally around 10:45 PM.  He watches TV in the bedroom and turns it off before falling asleep.  His general rise time is between 6 and 7 AM.  He has been working on weight loss, in the past few months he is lost about 15 pounds.  He has occasionally woken up with a headache.  He does not have night to night nocturia.  He lives with his wife, 1 cat in the household.  His Epworth sleepiness score is 3 out of 24.  I reviewed your office note from 12/08/2020.  He had a brain MRI with and without contrast on  11/29/2020 and I reviewed the results:   IMPRESSION: 1. Motion degraded study. 2. No acute intracranial abnormality. 3. Prominence of the supratentorial ventricles with relative preservation of the high convexity sulci which may be seen in the setting of normal pressure hydrocephalus in the appropriate clinical scenario. 4. Mild chronic microangiopathic changes. 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  He had an outpatient appointment with infectious diseases on 12/20/2020.  CSF culture and PCR testing for several pathogens were negative. He was advised to discontinue doxycycline at the time. His encephalopathy was suspected to be from his antineoplastic immunotherapy, his Beryle Flock has been held by oncology, he has an appointment pending.  He also has a CT scan pending.  He had a tonsillectomy as a child.  His brother had sleep apnea and has a CPAP machine.  His Past Medical History Is Significant For: Past Medical History:  Diagnosis Date  . Abnormal nuclear stress test    December, 2013  . Anemia   . Axillary adenopathy 04/24/2018  . CAD (coronary artery disease)    Mild nonobstructive plaque in cath 2013  . Chest pain    December, 2013  . Cough with hemoptysis 04/22/2018  . Dizziness   . Dyslipidemia 07/14/2019  . Encounter for antineoplastic immunotherapy 05/13/2018  . Gout   . Hemorrhoid   . Hyperlipidemia   . Hypertension   . Hypothyroidism (acquired) 08/11/2018  . Metastasis to lung ([place]) 05/19/2018  .  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) ([place]) dx'd 04/17/18   to LN, infrahilar mass, lung nodule and lt axilla  . Obesity, unspecified 03/28/2009   Qualifier: Diagnosis of  By: [name], MD, [name]    . Palpitations 03/28/2009   Qualifier: Diagnosis of  By: [name], MD, [name]    . Right lower lobe lung mass 04/22/2018  . Skin cancer   . SNORING 03/28/2009   Qualifier: Diagnosis of  By: [name], MD, [name]    . Spinal stenosis   . 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[place]) 05/13/2018    His Past Surgical History Is Significant For: Past Surgical History:  Procedure Laterality Date  . 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 BELPHAROPTOSIS REPAIR Bilateral   . CATARACT EXTRACTION W/ INTRAOCULAR LENS  IMPLANT, BILATERAL    . COLONOSCOPY N/A 11/03/2014   Procedure: COLONOSCOPY;  Surgeon: Rogene Houston, MD;  Location: [facility];  Service: Endoscopy;  Laterality: N/A;  1225  . IR CV LINE INJECTION  08/24/2020  . IR FLUORO GUIDED NEEDLE PLC ASPIRATION/INJECTION LOC  11/28/2020  . IR IMAGING GUIDED PORT INSERTION  07/11/2018  . KNEE CARTILAGE SURGERY Left    Left knee  . SKIN CANCER EXCISION  12/2014, 04/25/15  . VIDEO BRONCHOSCOPY Bilateral 05/09/2018   Procedure: VIDEO BRONCHOSCOPY WITH FLUORO;  Surgeon: Juanito Doom, MD;  Location: [facility];  Service: Cardiopulmonary;  Laterality: Bilateral;    His Family History Is Significant For: Family History  Problem Relation Age of Onset  . Heart attack Father   . Heart failure Mother   . Parkinson's disease Mother   . Healthy Sister   . Skin cancer Sister   . Neuropathy Brother   . COPD Brother   . Epilepsy Brother   . Healthy Sister   . Healthy Sister   . Colon cancer Neg Hx     His Social History Is Significant For: Social History   Socioeconomic History  . Marital status: Married    Spouse name: Not on file  . Number of children: 1  . Years of education: Not on file  . Highest education level: Not on file  Occupational History  . Occupation: Government social research officer  Tobacco Use  . Smoking status: Former Smoker    Packs/day: 0.20    Years: 2.00    Pack years: 0.40    Types: Cigarettes    Start date: 10/01/1968  . Smokeless tobacco: Never Used  Vaping Use  . Vaping Use: Never used  Substance and Sexual Activity  . Alcohol use: No    Comment: hx of   . Drug use: No  . Sexual activity: Not on file  Other  Topics Concern  . Not on file  Social History Narrative   Unable to ask intimate partner violence questions, wife present   Social Determinants of Health   Financial Resource Strain: Not on file  Food Insecurity: Not on file  Transportation Needs: Not on file  Physical Activity: Not on file  Stress: Not on file  Social Connections: Not on file    His Allergies Are:  Allergies  Allergen Reactions  . Pravastatin     Numbness in feet   . Iodine Hives and Rash    CT scan contrast caused rash and itching   :   His Current Medications Are:  Outpatient Encounter Medications as of 01/12/2021  Medication Sig  . acetaminophen (TYLENOL)  500 MG tablet Take 1,000 mg by mouth every 6 (six) hours as needed.  . Alirocumab (PRALUENT) 150 MG/ML SOAJ Inject 150 mg into the skin every 14 (fourteen) days.  . ALPRAZolam (XANAX) 0.25 MG tablet Take 1 tablet (0.25 mg total) by mouth at bedtime as needed for anxiety.  . Artificial Tear Solution (SOOTHE XP OP) Place 1 drop into both eyes 2 (two) times daily.  . carvedilol (COREG) 12.5 MG tablet Take 1 tablet (12.5 mg total) by mouth in the morning, at noon, and at bedtime.  . fluticasone (FLONASE) 50 MCG/ACT nasal spray Place 1 spray into both nostrils daily.  . Fluticasone-Umeclidin-Vilant (TRELEGY ELLIPTA) 100-62.5-25 MCG/INH AEPB Inhale 1 puff into the lungs daily.  Marland Kitchen levothyroxine (SYNTHROID) 175 MCG tablet TAKE 1 TABLET BY MOUTH EVERY DAY BEFORE BREAKFAST  . lidocaine-prilocaine (EMLA) cream Apply 1 application topically as needed.  . methocarbamol (ROBAXIN) 500 MG tablet Take 500 mg by mouth daily.  . pantoprazole (PROTONIX) 40 MG tablet TAKE 1 TABLET (40 MG TOTAL) BY MOUTH DAILY. X2 WEEKS PER FLAREUP OF REFLUX  . senna-docusate (SENNA S) 8.6-50 MG tablet 1 to 2 twice daily for constipation  . [DISCONTINUED] albuterol (PROVENTIL) (2.5 MG/3ML) 0.083% nebulizer solution Inhale 3 mLs (2.5 mg total) into the lungs every 6 (six) hours as needed for  wheezing or shortness of breath. (Patient not taking: Reported on 01/12/2021)  . [DISCONTINUED] Cyanocobalamin (VITAMIN B 12) 250 MCG LOZG Take by mouth.  . [DISCONTINUED] doxycycline (VIBRA-TABS) 100 MG tablet TAKE 1 TABLET (100 MG TOTAL) BY MOUTH EVERY TWELVE HOURS.  . [DISCONTINUED] ferrous sulfate 324 MG TBEC Take 324 mg by mouth daily with breakfast.   No facility-administered encounter medications on file as of 01/12/2021.  :   Review of Systems:  Out of a complete 14 point review of systems, all are reviewed and negative with the exception of these symptoms as listed below:  Review of Systems  Neurological:       Here for sleep consult no prior sleep study. Reports he does not snore at night. Pt reports he does not rest well at night and moves a lot. He a lot reports daily h/a in the morning.   Epworth Sleepiness Scale 0= would never doze 1= slight chance of dozing 2= moderate chance of dozing 3= high chance of dozing  Sitting and reading:0 Watching TV:1 Sitting inactive in a public place (ex. Theater or meeting):0 As a passenger in a car for an hour without a break:0 Lying down to rest in the afternoon:2 Sitting and talking to someone:0 Sitting quietly after lunch (no alcohol):0 In a car, while stopped in traffic:0 Total:3     Objective:  Neurological Exam  Physical Exam Physical Examination:   Vitals:   01/12/21 0857  BP: 106/66  Pulse: 74   General Examination: The patient is a very pleasant 75 y.o. male in no acute distress. He appears well-developed and well-nourished and well groomed.   HEENT: Normocephalic, atraumatic, pupils are equal, round and reactive to light, extraocular tracking is good without limitation to gaze excursion or nystagmus noted. Hearing is grossly intact. Face is symmetric with normal facial animation. Speech is clear with no dysarthria noted. There is no hypophonia. There is no lip, neck/head, jaw or voice tremor. Neck is supple with full  range of passive and active motion. There are no carotid bruits on auscultation. Oropharynx exam reveals: mild mouth dryness, adequate dental hygiene and mild airway crowding, due to smaller airway entry. Mallampati  is class III. Tongue protrudes centrally and palate elevates symmetrically. Tonsils are absent. Neck size is 19 3/8 inches. He has a minimal overbite.   Chest: Clear to auscultation without wheezing, rhonchi or crackles noted.  Heart: S1+S2+0, regular and normal without murmurs, rubs or gallops noted.   Abdomen: Soft, non-tender and non-distended with normal bowel sounds appreciated on auscultation.  Extremities: There is no pitting edema in the distal lower extremities bilaterally.   Skin: Warm and dry without trophic changes noted.   Musculoskeletal: exam reveals mild discomfort R foot.   Neurologically:  Mental status: The patient is awake, alert and oriented in all 4 spheres. His immediate and remote memory, attention, language skills and fund of knowledge are appropriate. There is no evidence of aphasia, agnosia, apraxia or anomia. Speech is clear with normal prosody and enunciation. Thought process is linear. Mood is normal and affect is normal.  Cranial nerves II - XII are as described above under HEENT exam.  Motor exam: Normal bulk, strength and tone is noted. There is no tremor, Romberg is negative. Reflexes are 2+ throughout. Fine motor skills and coordination: grossly intact.  Cerebellar testing: No dysmetria or intention tremor. There is no truncal or gait ataxia.  Sensory exam: intact to light touch in the upper and lower extremities.  Gait, station and balance: He stands easily. No veering to one side is noted. No leaning to one side is noted. Posture is age-appropriate and stance is narrow based. Gait shows slight R limp initially.   Assessment and Plan:  In summary, JAROLD MACOMBER is a very pleasant 75 y.o.-year old male  with an underlying complex medical history  of coronary artery disease, hypertension, hyperlipidemia, hypothyroidism, metastatic lung cancer, spinal stenosis, squamous cell cancer, gout, hospitalization in March 2022 for encephalopathy/encephalitis, suspected to be from tickborne illness, and borderline obesity, whose history and physical exam are concerning for obstructive sleep apnea (OSA). I had a long chat with the patient about my findings and the diagnosis of OSA, its prognosis and treatment options. We talked about medical treatments, surgical interventions and non-pharmacological approaches. I explained in particular the risks and ramifications of untreated moderate to severe OSA, especially with respect to developing cardiovascular disease down the Road, including congestive heart failure, difficult to treat hypertension, cardiac arrhythmias, or stroke. Even type 2 diabetes has, in part, been linked to untreated OSA. Symptoms of untreated OSA include daytime sleepiness, memory problems, mood irritability and mood disorder such as depression and anxiety, lack of energy, as well as recurrent headaches, especially morning headaches. We talked about trying to maintain a healthy lifestyle in general, as well as the importance of weight control. We also talked about the importance of good sleep hygiene. I recommended the following at this time: sleep study.  He is not sure if he can sleep in the sleep lab, he will likely prefer a home sleep test.  Of note, he has been taking melatonin, 2 or 3 mg at night with some success recently. I explained the sleep test procedure to the patient and also outlined possible surgical and non-surgical treatment options of OSA, including the use of a custom-made dental device (which would require a referral to a specialist dentist or oral surgeon), upper airway surgical options, such as traditional UPPP or a novel less invasive surgical option in the form of Inspire hypoglossal nerve stimulation (which would involve a  referral to an ENT surgeon). I also explained the CPAP treatment option to the patient.  We  will plan a follow-up after testing, we will call him in the interim after his sleep study for a verbal summary of his results as well.  I answered all his questions today and he was in agreement with the plan.   Thank you very much for allowing me to participate in the care of this nice patient. If I can be of any further assistance to you please do not hesitate to call me at [phone].  Sincerely,   Star Age, MD, PhD

## 2021-01-16 ENCOUNTER — Encounter (HOSPITAL_COMMUNITY): Payer: Self-pay

## 2021-01-16 ENCOUNTER — Ambulatory Visit (HOSPITAL_COMMUNITY)
Admission: RE | Admit: 2021-01-16 | Discharge: 2021-01-16 | Disposition: A | Discharge status: Home / Self Care | Payer: Medicare Other | Source: Ambulatory Visit | Attending: Internal Medicine | Admitting: Internal Medicine

## 2021-01-16 ENCOUNTER — Telehealth: Payer: Self-pay | Admitting: Medical Oncology

## 2021-01-16 ENCOUNTER — Other Ambulatory Visit: Payer: Self-pay

## 2021-01-16 ENCOUNTER — Inpatient Hospital Stay: Payer: Medicare Other | Attending: Oncology

## 2021-01-16 DIAGNOSIS — Z79899 Other long term (current) drug therapy: Secondary | ICD-10-CM | POA: Insufficient documentation

## 2021-01-16 DIAGNOSIS — I119 Hypertensive heart disease without heart failure: Secondary | ICD-10-CM | POA: Diagnosis not present

## 2021-01-16 DIAGNOSIS — E039 Hypothyroidism, unspecified: Secondary | ICD-10-CM | POA: Diagnosis not present

## 2021-01-16 DIAGNOSIS — N400 Enlarged prostate without lower urinary tract symptoms: Secondary | ICD-10-CM | POA: Insufficient documentation

## 2021-01-16 DIAGNOSIS — C3401 Malignant neoplasm of right main bronchus: Secondary | ICD-10-CM | POA: Insufficient documentation

## 2021-01-16 DIAGNOSIS — C3490 Malignant neoplasm of unspecified part of unspecified bronchus or lung: Secondary | ICD-10-CM

## 2021-01-16 DIAGNOSIS — E785 Hyperlipidemia, unspecified: Secondary | ICD-10-CM | POA: Insufficient documentation

## 2021-01-16 LAB — CBC WITH DIFFERENTIAL (CANCER CENTER ONLY)
Abs Immature Granulocytes: 0.02 10*3/uL (ref 0.00–0.07)
Basophils Absolute: 0.1 10*3/uL (ref 0.0–0.1)
Basophils Relative: 1 %
Eosinophils Absolute: 0.2 10*3/uL (ref 0.0–0.5)
Eosinophils Relative: 3 %
HCT: 36.6 % — ABNORMAL LOW (ref 39.0–52.0)
Hemoglobin: 12.4 g/dL — ABNORMAL LOW (ref 13.0–17.0)
Immature Granulocytes: 0 %
Lymphocytes Relative: 20 %
Lymphs Abs: 1.1 10*3/uL (ref 0.7–4.0)
MCH: 31.8 pg (ref 26.0–34.0)
MCHC: 33.9 g/dL (ref 30.0–36.0)
MCV: 93.8 fL (ref 80.0–100.0)
Monocytes Absolute: 0.8 10*3/uL (ref 0.1–1.0)
Monocytes Relative: 15 %
Neutro Abs: 3.5 10*3/uL (ref 1.7–7.7)
Neutrophils Relative %: 61 %
Platelet Count: 159 10*3/uL (ref 150–400)
RBC: 3.9 MIL/uL — ABNORMAL LOW (ref 4.22–5.81)
RDW: 11.9 % (ref 11.5–15.5)
WBC Count: 5.6 10*3/uL (ref 4.0–10.5)
nRBC: 0 % (ref 0.0–0.2)

## 2021-01-16 LAB — CMP (CANCER CENTER ONLY)
ALT: 12 U/L (ref 0–44)
AST: 16 U/L (ref 15–41)
Albumin: 4 g/dL (ref 3.5–5.0)
Alkaline Phosphatase: 56 U/L (ref 38–126)
Anion gap: 10 (ref 5–15)
BUN: 17 mg/dL (ref 8–23)
CO2: 24 mmol/L (ref 22–32)
Calcium: 9.6 mg/dL (ref 8.9–10.3)
Chloride: 107 mmol/L (ref 98–111)
Creatinine: 0.95 mg/dL (ref 0.61–1.24)
GFR, Estimated: >60 mL/min (ref >60)
Glucose, Bld: 101 mg/dL — ABNORMAL HIGH (ref 70–99)
Potassium: 4.6 mmol/L (ref 3.5–5.1)
Sodium: 141 mmol/L (ref 135–145)
Total Bilirubin: 0.7 mg/dL (ref 0.3–1.2)
Total Protein: 7.5 g/dL (ref 6.5–8.1)

## 2021-01-16 NOTE — Telephone Encounter (Signed)
To call ba and let us know if he is allergic to CT  Dye ( iodine) . If so, he needs 3 doses of prednisone and we can send it to his pharmacy.

## 2021-01-16 NOTE — Telephone Encounter (Signed)
Pt stated he did have iodine allergy and received a prednisone prescription from radiologist. Pt understands how to take it.

## 2021-01-17 ENCOUNTER — Encounter: Payer: Self-pay | Admitting: Family Medicine

## 2021-01-17 ENCOUNTER — Ambulatory Visit (HOSPITAL_COMMUNITY)
Admission: RE | Admit: 2021-01-17 | Discharge: 2021-01-17 | Disposition: A | Discharge status: Home / Self Care | Payer: Medicare Other | Source: Ambulatory Visit | Attending: Internal Medicine | Admitting: Internal Medicine

## 2021-01-17 ENCOUNTER — Ambulatory Visit (INDEPENDENT_AMBULATORY_CARE_PROVIDER_SITE_OTHER): Payer: Medicare Other | Admitting: Family Medicine

## 2021-01-17 VITALS — BP 135/81 | HR 89 | Temp 98.0°F | Ht 74.0 in | Wt 236.0 lb

## 2021-01-17 DIAGNOSIS — R519 Headache, unspecified: Secondary | ICD-10-CM | POA: Diagnosis not present

## 2021-01-17 DIAGNOSIS — C3490 Malignant neoplasm of unspecified part of unspecified bronchus or lung: Secondary | ICD-10-CM | POA: Insufficient documentation

## 2021-01-17 DIAGNOSIS — C3491 Malignant neoplasm of unspecified part of right bronchus or lung: Secondary | ICD-10-CM

## 2021-01-17 DIAGNOSIS — K219 Gastro-esophageal reflux disease without esophagitis: Secondary | ICD-10-CM

## 2021-01-17 DIAGNOSIS — G479 Sleep disorder, unspecified: Secondary | ICD-10-CM | POA: Diagnosis not present

## 2021-01-17 DIAGNOSIS — J42 Unspecified chronic bronchitis: Secondary | ICD-10-CM

## 2021-01-17 DIAGNOSIS — G0490 Encephalitis and encephalomyelitis, unspecified: Secondary | ICD-10-CM | POA: Diagnosis not present

## 2021-01-17 MED ORDER — SODIUM CHLORIDE (PF) 0.9 % IJ SOLN
INTRAMUSCULAR | Status: AC
  Filled 2021-01-17: qty 50

## 2021-01-17 MED ORDER — NURTEC 75 MG PO TBDP: ORAL_TABLET | ORAL | 0 refills | Status: DC

## 2021-01-17 MED ORDER — IOHEXOL 300 MG/ML  SOLN
100.0000 mL | Freq: Once | INTRAMUSCULAR | Status: AC | PRN
  Administered 2021-01-17: 100 mL via INTRAVENOUS

## 2021-01-17 NOTE — Progress Notes (Signed)
Subjective: CC: f/u GERD, headache PCP: [name], DO ATF:TDDU Travis Padilla is a 75 y.o. male presenting to clinic today for:  1.  GERD Patient was placed on a 2-week trial of PPI.  He notes that he is had fairly good control of his GERD off of the PPI and only uses as needed now  2.  Daily headache Patient reports ongoing daily headache after recent hospitalization for encephalopathy.  He has polysomnogram scheduled soon but unsure if this will be at home or in office.  He has follow-up CAT scan as well as an office visit with his oncologist later this week.  There was question is whether or not Keytruda may be contributing to some of his symptoms.  He was seen by infectious disease and they did not feel that any of his symptoms are related to tickborne illness.  He does have history of COPD and reports compliance with Trelegy daily.  He admits that this is somewhat expensive and he is considering following up with his pulmonologist with regards to need for ongoing treatment.  Back pain is also been slightly flared and he in fact did use a Robaxin recently.  He has been avoiding the more vigorous physical exercise that he typically does and only is doing walking now.  Sleep had been better with use of melatonin but notes that this seemed to exacerbate his headache and therefore he has not utilized it as much.  He does note that infectious disease doctor suggested that he might benefit from a neurologic consult but he wants to hold off on this for now.  He is currently using Tylenol for treatment.  He admits that he does feel little more on edge and quick to snap on his wife but is always apologetic.  He is unsure if this is a situational issue but again has not been relying very heavily on the Xanax due to concerns for excessive sedation from the medicine.   ROS: Per HPI  Allergies  Allergen Reactions  . Pravastatin     Numbness in feet   . Iodine Hives and Rash    CT scan  contrast caused rash and itching    Past Medical History:  Diagnosis Date  . Abnormal nuclear stress test    December, 2013  . Anemia   . Axillary adenopathy 04/24/2018  . CAD (coronary artery disease)    Mild nonobstructive plaque in cath 2013  . Chest pain    December, 2013  . Cough with hemoptysis 04/22/2018  . Dizziness   . Dyslipidemia 07/14/2019  . Encounter for antineoplastic immunotherapy 05/13/2018  . Gout   . Hemorrhoid   . Hyperlipidemia   . Hypertension   . Hypothyroidism (acquired) 08/11/2018  . Metastasis to lung ([place]) 05/19/2018  . 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) ([place]) dx'd 04/17/18   to LN, infrahilar mass, lung nodule and lt axilla  . Obesity, unspecified 03/28/2009   Qualifier: Diagnosis of  By: [name], MD, [name]    . Palpitations 03/28/2009   Qualifier: Diagnosis of  By: [name], MD, [name]    . Right lower lobe lung mass 04/22/2018  . Skin cancer   . SNORING 03/28/2009   Qualifier: Diagnosis of  By: [name], MD, [name]    . Spinal stenosis   . 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[place]) 05/13/2018    Current Outpatient Medications:  .  acetaminophen (TYLENOL) 500 MG tablet, Take 1,000  mg by mouth every 6 (six) hours as needed., Disp: , Rfl:  .  Alirocumab (PRALUENT) 150 MG/ML SOAJ, Inject 150 mg into the skin every 14 (fourteen) days., Disp: 2 pen, Rfl: 11 .  Artificial Tear Solution (SOOTHE XP OP), Place 1 drop into both eyes 2 (two) times daily., Disp: , Rfl:  .  carvedilol (COREG) 12.5 MG tablet, Take 1 tablet (12.5 mg total) by mouth in the morning, at noon, and at bedtime., Disp: 270 tablet, Rfl: 3 .  fluticasone (FLONASE) 50 MCG/ACT nasal spray, Place 1 spray into both nostrils daily., Disp: 16 g, Rfl: 3 .  Fluticasone-Umeclidin-Vilant (TRELEGY ELLIPTA) 100-62.5-25 MCG/INH AEPB, Inhale 1 puff into the lungs daily., Disp: 60 each, Rfl: 6 .  levothyroxine (SYNTHROID) 175 MCG tablet, TAKE 1 TABLET BY MOUTH  EVERY DAY BEFORE BREAKFAST, Disp: 30 tablet, Rfl: 1 .  lidocaine-prilocaine (EMLA) cream, Apply 1 application topically as needed., Disp: 30 g, Rfl: 1 .  methocarbamol (ROBAXIN) 500 MG tablet, Take 500 mg by mouth daily., Disp: , Rfl:  .  senna-docusate (SENNA S) 8.6-50 MG tablet, 1 to 2 twice daily for constipation, Disp: 120 tablet, Rfl: 5 .  tamsulosin (FLOMAX) 0.4 MG CAPS capsule, Take 0.4 mg by mouth at bedtime., Disp: , Rfl:  .  ALPRAZolam (XANAX) 0.25 MG tablet, Take 1 tablet (0.25 mg total) by mouth at bedtime as needed for anxiety. (Patient not taking: Reported on 01/17/2021), Disp: 30 tablet, Rfl: 0 .  pantoprazole (PROTONIX) 40 MG tablet, TAKE 1 TABLET (40 MG TOTAL) BY MOUTH DAILY. X2 WEEKS PER FLAREUP OF REFLUX (Patient not taking: Reported on 01/17/2021), Disp: 30 tablet, Rfl: 0 Social History   Socioeconomic History  . Marital status: Married    Spouse name: Not on file  . Number of children: 1  . Years of education: Not on file  . Highest education level: Not on file  Occupational History  . Occupation: Government social research officer  Tobacco Use  . Smoking status: Former Smoker    Packs/day: 0.20    Years: 2.00    Pack years: 0.40    Types: Cigarettes    Start date: 10/01/1968  . Smokeless tobacco: Never Used  Vaping Use  . Vaping Use: Never used  Substance and Sexual Activity  . Alcohol use: No    Comment: hx of   . Drug use: No  . Sexual activity: Not on file  Other Topics Concern  . Not on file  Social History Narrative   Unable to ask intimate partner violence questions, wife present   Social Determinants of Health   Financial Resource Strain: Not on file  Food Insecurity: Not on file  Transportation Needs: Not on file  Physical Activity: Not on file  Stress: Not on file  Social Connections: Not on file  Intimate Partner Violence: Not on file   Family History  Problem Relation Age of Onset  . Heart attack Father   . Heart failure Mother   . Parkinson's disease  Mother   . Healthy Sister   . Skin cancer Sister   . Neuropathy Brother   . COPD Brother   . Epilepsy Brother   . Healthy Sister   . Healthy Sister   . Colon cancer Neg Hx     Objective: Office vital signs reviewed. BP 135/81   Pulse 89   Temp 98 F (36.7 C)   Ht 6\' 2"  (1.88 m)   Wt 236 lb (107 kg)   SpO2 98%  BMI 30.30 kg/m   Physical Examination:  General: Awake, alert, well nourished, No acute distress HEENT: Normal; sclera white Cardio: regular rate   Pulm: Normal work of breathing on room air Psych: Mood is stable.  Patient is pleasant and interactive Neuro: Thought process seems to be much more clear and seems to be faster to process than last visit  Depression screen [facility] 2/9 12/20/2020 12/08/2020 07/27/2020  Decreased Interest 0 0 0  Down, Depressed, Hopeless 0 0 0  PHQ - 2 Score 0 0 0  Altered sleeping - 3 0  Tired, decreased energy - 1 0  Change in appetite - 0 0  Feeling bad or failure about yourself  - 0 0  Trouble concentrating - 0 0  Moving slowly or fidgety/restless - 0 0  Suicidal thoughts - 0 0  PHQ-9 Score - 4 0  Difficult doing work/chores - Not difficult at all -  Some recent data might be hidden   GAD 7 : Generalized Anxiety Score 12/08/2020 11/03/2019  Nervous, Anxious, on Edge 0 0  Control/stop worrying 0 0  Worry too much - different things 0 2  Trouble relaxing 0 0  Restless 0 0  Easily annoyed or irritable 0 0  Afraid - awful might happen 0 0  Total GAD 7 Score 0 2  Anxiety Difficulty Not difficult at all Not difficult at all   Assessment/ Plan: 75 y.o. male   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HCC)  Encephalitis  Sleep disorder  Gastroesophageal reflux disease without esophagitis  Daily headache - Plan: Rimegepant Sulfate (NURTEC) 75 MG TBDP  Chronic bronchitis, unspecified chronic bronchitis type ([place])  All of his ailments seem to be getting somewhat better though most not totally resolved.  I agree that the  daily headaches could be suggestive of an undiagnosed sleep apnea and do think that he should proceed with OSA eval. we discussed potential for neurologic eval pending outcomes of other items being worked up  Seems to be clearing from an encephalitis standpoint.  Unsure if the daily headache is related to recent illness versus OSA as above  GERD is well controlled with as needed use of PPI  Will await recommendations by oncology pending upcoming CT  Advised that shingles vaccine is recommended but I would wait for oncology's input before proceeding    No orders of the defined types were placed in this encounter.  No orders of the defined types were placed in this encounter.   I advised him not to go off of his inhalers without recommendation by pulmonology.  I have given him samples of Trelegy as well as Nurtec today.  [name], DO [place] [phone]

## 2021-01-17 NOTE — Patient Instructions (Signed)
Nurtec: for migraine headache.  Dissolve 1 tablet in mouth at onset of moderate headache not relieved by Tylenol.  -Let me know if referral to neurology is needed  If Trelegy is switched to alternative (like [place]) by your lung specialist, we can get patient assistance for [facility].  See Almyra Free for this if you need help

## 2021-01-19 ENCOUNTER — Encounter: Payer: Self-pay | Admitting: Internal Medicine

## 2021-01-19 ENCOUNTER — Inpatient Hospital Stay (HOSPITAL_BASED_OUTPATIENT_CLINIC_OR_DEPARTMENT_OTHER): Payer: Medicare Other | Admitting: Internal Medicine

## 2021-01-19 ENCOUNTER — Other Ambulatory Visit: Payer: Self-pay

## 2021-01-19 ENCOUNTER — Other Ambulatory Visit: Payer: Self-pay | Admitting: Medical Oncology

## 2021-01-19 VITALS — BP 134/81 | HR 65 | Temp 97.5°F | Resp 20 | Ht 74.0 in | Wt 239.3 lb

## 2021-01-19 DIAGNOSIS — E785 Hyperlipidemia, unspecified: Secondary | ICD-10-CM | POA: Diagnosis not present

## 2021-01-19 DIAGNOSIS — E039 Hypothyroidism, unspecified: Secondary | ICD-10-CM | POA: Diagnosis not present

## 2021-01-19 DIAGNOSIS — Z91041 Radiographic dye allergy status: Secondary | ICD-10-CM

## 2021-01-19 DIAGNOSIS — C3490 Malignant neoplasm of unspecified part of unspecified bronchus or lung: Secondary | ICD-10-CM | POA: Diagnosis not present

## 2021-01-19 DIAGNOSIS — I2510 Atherosclerotic heart disease of native coronary artery without angina pectoris: Secondary | ICD-10-CM

## 2021-01-19 DIAGNOSIS — C3491 Malignant neoplasm of unspecified part of right bronchus or lung: Secondary | ICD-10-CM | POA: Diagnosis not present

## 2021-01-19 DIAGNOSIS — C3401 Malignant neoplasm of right main bronchus: Secondary | ICD-10-CM | POA: Diagnosis not present

## 2021-01-19 DIAGNOSIS — N400 Enlarged prostate without lower urinary tract symptoms: Secondary | ICD-10-CM | POA: Diagnosis not present

## 2021-01-19 DIAGNOSIS — I10 Essential (primary) hypertension: Secondary | ICD-10-CM | POA: Diagnosis not present

## 2021-01-19 DIAGNOSIS — I119 Hypertensive heart disease without heart failure: Secondary | ICD-10-CM | POA: Diagnosis not present

## 2021-01-19 DIAGNOSIS — Z79899 Other long term (current) drug therapy: Secondary | ICD-10-CM | POA: Diagnosis not present

## 2021-01-19 MED ORDER — PREDNISONE 50 MG PO TABS: 50.0000 mg | ORAL_TABLET | ORAL | 3 refills | Status: DC

## 2021-01-19 MED ORDER — DIPHENHYDRAMINE HCL 50 MG PO TABS: 50.0000 mg | ORAL_TABLET | ORAL | 0 refills | Status: DC

## 2021-01-19 NOTE — Progress Notes (Signed)
[facility] [phone]   [phone]) [phone]  OFFICE PROGRESS NOTE  [name], DO [place] [address]  DIAGNOSIS: Stage IV (T3, N0, M1c) non-small cell lung cancer, squamous cell carcinoma presented with large right infrahilar mass in addition to left upper lobe lung nodule as well as left axillary mass with left axillary lymph node diagnosed in August 2019.  PRIOR THERAPY:  1) Palliative radiotherapy to the right infrahilar mass as well as the axillary mass under the care of Dr. Lisbeth Renshaw. 2) Systemic chemotherapy with carboplatin for AUC of 5, paclitaxel 175 mg/M2 and Keytruda 200 mg IV every 3 weeks status post 4 cycles.  CURRENT THERAPY: Maintenance immunotherapy with single agent Keytruda 200 mg IV every 3 weeks status post 41 cycles.  His treatment is currently on hold secondary to intolerance.  INTERVAL HISTORY: Travis LADUCA 75 y.o. male returns to the clinic today for follow-up visit.  The patient is feeling fine today with no concerning complaints except for the generalized fatigue and persistent headache.  He denied having any current chest pain, shortness of breath, cough or hemoptysis.  He denied having any fever or chills.  He has no nausea, vomiting, diarrhea or constipation.  He has no fever or chills.  He has been off treatment for several months.  The patient had repeat CT scan of the chest, abdomen pelvis performed recently and he is here for evaluation and discussion of his scan results.  MEDICAL HISTORY: Past Medical History:  Diagnosis Date  . Abnormal nuclear stress test    December, 2013  . Anemia   . Axillary adenopathy 04/24/2018  . CAD (coronary artery disease)    Mild nonobstructive plaque in cath 2013  . Chest pain    December, 2013  . Cough with hemoptysis 04/22/2018  . Dizziness   . Dyslipidemia 07/14/2019  . Encounter for antineoplastic immunotherapy 05/13/2018  . Gout   . Hemorrhoid   .  Hyperlipidemia   . Hypertension   . Hypothyroidism (acquired) 08/11/2018  . 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 Obesity, unspecified 03/28/2009   Qualifier: Diagnosis of  By: [name], MD, [name]    . Palpitations 03/28/2009   Qualifier: Diagnosis of  By: [name], MD, [name]    . Right lower lobe lung mass 04/22/2018  . Skin cancer   . SNORING 03/28/2009   Qualifier: Diagnosis of  By: [name], MD, [name]    . Spinal stenosis   . 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[facility]) 05/13/2018    ALLERGIES:  is allergic to pravastatin and iodine.  MEDICATIONS:  Current Outpatient Medications  Medication Sig Dispense Refill  . acetaminophen (TYLENOL) 500 MG tablet Take 1,000 mg by mouth every 6 (six) hours as needed.    . Alirocumab (PRALUENT) 150 MG/ML SOAJ Inject 150 mg into the skin every 14 (fourteen) days. 2 pen 11  . ALPRAZolam (XANAX) 0.25 MG tablet Take 1 tablet (0.25 mg total) by mouth at bedtime as needed for anxiety. (Patient not taking: Reported on 01/17/2021) 30 tablet 0  . Artificial Tear Solution (SOOTHE XP OP) Place 1 drop into both eyes 2 (two) times daily.    . carvedilol (COREG) 12.5 MG tablet Take 1 tablet (12.5 mg total) by mouth in the morning, at  noon, and at bedtime. 270 tablet 3  . fluticasone (FLONASE) 50 MCG/ACT nasal spray Place 1 spray into both nostrils daily. 16 g 3  . Fluticasone-Umeclidin-Vilant (TRELEGY ELLIPTA) 100-62.5-25 MCG/INH AEPB Inhale 1 puff into the lungs daily. 60 each 6  . levothyroxine (SYNTHROID) 175 MCG tablet TAKE 1 TABLET BY MOUTH EVERY DAY BEFORE BREAKFAST 30 tablet 1  . lidocaine-prilocaine (EMLA) cream Apply 1 application topically as needed. 30 g 1  . methocarbamol (ROBAXIN) 500 MG tablet Take 500 mg by mouth daily.    . pantoprazole (PROTONIX) 40 MG tablet TAKE 1 TABLET (40 MG TOTAL) BY  MOUTH DAILY. X2 WEEKS PER FLAREUP OF REFLUX (Patient not taking: Reported on 01/17/2021) 30 tablet 0  . Rimegepant Sulfate (NURTEC) 75 MG TBDP Dissolve 1 tablet in mouth at onset of migraine ONCE daily if needed. 2 tablet 0  . senna-docusate (SENNA S) 8.6-50 MG tablet 1 to 2 twice daily for constipation 120 tablet 5  . tamsulosin (FLOMAX) 0.4 MG CAPS capsule Take 0.4 mg by mouth at bedtime.     No current facility-administered medications for this visit.    SURGICAL HISTORY:  Past Surgical History:  Procedure Laterality Date  . 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 BELPHAROPTOSIS REPAIR Bilateral   . CATARACT EXTRACTION W/ INTRAOCULAR LENS  IMPLANT, BILATERAL    . COLONOSCOPY N/A 11/03/2014   Procedure: COLONOSCOPY;  Surgeon: Rogene Houston, MD;  Location: [facility];  Service: Endoscopy;  Laterality: N/A;  1225  . IR CV LINE INJECTION  08/24/2020  . IR FLUORO GUIDED NEEDLE PLC ASPIRATION/INJECTION LOC  11/28/2020  . IR IMAGING GUIDED PORT INSERTION  07/11/2018  . KNEE CARTILAGE SURGERY Left    Left knee  . SKIN CANCER EXCISION  12/2014, 04/25/15  . VIDEO BRONCHOSCOPY Bilateral 05/09/2018   Procedure: VIDEO BRONCHOSCOPY WITH FLUORO;  Surgeon: Juanito Doom, MD;  Location: [facility];  Service: Cardiopulmonary;  Laterality: Bilateral;    REVIEW OF SYSTEMS:  Constitutional: positive for fatigue Eyes: negative Ears, nose, mouth, throat, and face: negative Respiratory: negative Cardiovascular: negative Gastrointestinal: negative Genitourinary:negative Integument/breast: negative Hematologic/lymphatic: negative Musculoskeletal:negative Neurological: positive for headaches Behavioral/Psych: negative Endocrine: negative Allergic/Immunologic: negative   PHYSICAL EXAMINATION: General appearance: alert, cooperative, fatigued and no distress Head: Normocephalic, without obvious abnormality, atraumatic Neck: no adenopathy, no JVD, supple, symmetrical, trachea midline and thyroid  not enlarged, symmetric, no tenderness/mass/nodules Lymph nodes: Cervical, supraclavicular, and axillary nodes normal. Resp: clear to auscultation bilaterally Back: symmetric, no curvature. ROM normal. No CVA tenderness. Cardio: regular rate and rhythm, S1, S2 normal, no murmur, click, rub or gallop GI: soft, non-tender; bowel sounds normal; no masses,  no organomegaly Extremities: extremities normal, atraumatic, no cyanosis or edema Neurologic: Alert and oriented X 3, normal strength and tone. Normal symmetric reflexes. Normal coordination and gait  ECOG PERFORMANCE STATUS: 1 - Symptomatic but completely ambulatory  Blood pressure 134/81, pulse 65, temperature (!) 97.5 F (36.4 C), temperature source Tympanic, resp. rate 20, height 6\' 2"  (1.88 m), weight 239 lb 4.8 oz (108.5 kg), SpO2 99 %.  LABORATORY DATA: Lab Results  Component Value Date   WBC 5.6 01/16/2021   HGB 12.4 (L) 01/16/2021   HCT 36.6 (L) 01/16/2021   MCV 93.8 01/16/2021   PLT 159 01/16/2021      Chemistry      Component Value Date/Time   NA 141 01/16/2021 1240   NA 144 04/09/2018 1507   K 4.6 01/16/2021 1240   CL 107  01/16/2021 1240   CO2 24 01/16/2021 1240   BUN 17 01/16/2021 1240   BUN 15 04/09/2018 1507   CREATININE 0.95 01/16/2021 1240      Component Value Date/Time   CALCIUM 9.6 01/16/2021 1240   ALKPHOS 56 01/16/2021 1240   AST 16 01/16/2021 1240   ALT 12 01/16/2021 1240   BILITOT 0.7 01/16/2021 1240       RADIOGRAPHIC STUDIES: CT Chest W Contrast  Result Date: 01/18/2021 CLINICAL DATA:  Primary Cancer Type: Lung Imaging Indication: Routine surveillance Interval therapy since last imaging? No Initial Cancer Diagnosis Date: 05/09/2018; Established by: Biopsy-proven Detailed Pathology: Stage IV non-small cell lung cancer, squamous cell carcinoma. Primary Tumor location: Right lower lobe, left upper lobe. Left axillary mass. Surgeries: No. Chemotherapy: Yes; Ongoing?  No; Most recent administration:  2019 Immunotherapy?  Yes; Type: Keytruda; Ongoing? No Radiation therapy? Yes; Date Range: 04/30/2018 - 05/21/2018; Target: Right lung and left axilla. EXAM: CT CHEST, ABDOMEN, AND PELVIS WITH CONTRAST TECHNIQUE: Multidetector CT imaging of the chest, abdomen and pelvis was performed following the standard protocol during bolus administration of intravenous contrast. CONTRAST:  173mL OMNIPAQUE IOHEXOL 300 MG/ML SOLN, additional oral enteric contrast COMPARISON:  Most recent CT chest, abdomen and pelvis 11/03/2020. 05/05/2018 PET-CT. FINDINGS: CT CHEST FINDINGS Cardiovascular: Right chest port catheter. Aortic atherosclerosis. Normal heart size. Three-vessel coronary artery calcifications. No pericardial effusion. Mediastinum/Nodes: Unchanged spiculated soft tissue nodule or lymph node in the left axilla measuring 1.9 x 1.2 cm (series 2, image 10). No enlarged mediastinal or hilar lymph nodes. Thyroid gland, trachea, and esophagus demonstrate no significant findings. Lungs/Pleura: Spiculated mass of the anterior left upper lobe is not significantly changed compared to prior examination, measuring 3.1 x 2.5 cm (series 4, image 70). Unchanged perihilar radiation fibrosis and volume loss of the right lung. No pleural effusion or pneumothorax. Musculoskeletal: No chest wall mass or suspicious bone lesions identified. CT ABDOMEN PELVIS FINDINGS Hepatobiliary: No solid liver abnormality is seen. No gallstones, gallbladder wall thickening, or biliary dilatation. Pancreas: Unremarkable. No pancreatic ductal dilatation or surrounding inflammatory changes. Spleen: Normal in size without significant abnormality. Adrenals/Urinary Tract: Adrenal glands are unremarkable. Multiple small bilateral nonobstructive renal calculi. Bladder is unremarkable. Stomach/Bowel: Stomach is within normal limits. Appendix appears normal. No evidence of bowel wall thickening, distention, or inflammatory changes. Descending and sigmoid diverticula.  Vascular/Lymphatic: Aortic atherosclerosis. No enlarged abdominal or pelvic lymph nodes. Reproductive: Mild prostatomegaly. Other: No abdominal wall hernia or abnormality. No abdominopelvic ascites. Musculoskeletal: No acute or significant osseous findings. IMPRESSION: 1. Unchanged spiculated mass of the anterior left upper lobe. 2. Unchanged spiculated soft tissue nodule or lymph node in the left axilla. 3. Unchanged perihilar radiation fibrosis and volume loss of the right lung. 4. No evidence of metastatic disease in the abdomen or pelvis. 5. Nonobstructive bilateral nephrolithiasis. 6. Coronary artery disease. Aortic Atherosclerosis (ICD10-I70.0). Electronically Signed   By: Eddie Candle M.D.   On: 01/18/2021 10:27   CT Abdomen Pelvis W Contrast  Result Date: 01/18/2021 CLINICAL DATA:  Primary Cancer Type: Lung Imaging Indication: Routine surveillance Interval therapy since last imaging? No Initial Cancer Diagnosis Date: 05/09/2018; Established by: Biopsy-proven Detailed Pathology: Stage IV non-small cell lung cancer, squamous cell carcinoma. Primary Tumor location: Right lower lobe, left upper lobe. Left axillary mass. Surgeries: No. Chemotherapy: Yes; Ongoing?  No; Most recent administration: 2019 Immunotherapy?  Yes; Type: Keytruda; Ongoing? No Radiation therapy? Yes; Date Range: 04/30/2018 - 05/21/2018; Target: Right lung and left axilla. EXAM: CT CHEST, ABDOMEN, AND PELVIS  WITH CONTRAST TECHNIQUE: Multidetector CT imaging of the chest, abdomen and pelvis was performed following the standard protocol during bolus administration of intravenous contrast. CONTRAST:  156mL OMNIPAQUE IOHEXOL 300 MG/ML SOLN, additional oral enteric contrast COMPARISON:  Most recent CT chest, abdomen and pelvis 11/03/2020. 05/05/2018 PET-CT. FINDINGS: CT CHEST FINDINGS Cardiovascular: Right chest port catheter. Aortic atherosclerosis. Normal heart size. Three-vessel coronary artery calcifications. No pericardial effusion.  Mediastinum/Nodes: Unchanged spiculated soft tissue nodule or lymph node in the left axilla measuring 1.9 x 1.2 cm (series 2, image 10). No enlarged mediastinal or hilar lymph nodes. Thyroid gland, trachea, and esophagus demonstrate no significant findings. Lungs/Pleura: Spiculated mass of the anterior left upper lobe is not significantly changed compared to prior examination, measuring 3.1 x 2.5 cm (series 4, image 70). Unchanged perihilar radiation fibrosis and volume loss of the right lung. No pleural effusion or pneumothorax. Musculoskeletal: No chest wall mass or suspicious bone lesions identified. CT ABDOMEN PELVIS FINDINGS Hepatobiliary: No solid liver abnormality is seen. No gallstones, gallbladder wall thickening, or biliary dilatation. Pancreas: Unremarkable. No pancreatic ductal dilatation or surrounding inflammatory changes. Spleen: Normal in size without significant abnormality. Adrenals/Urinary Tract: Adrenal glands are unremarkable. Multiple small bilateral nonobstructive renal calculi. Bladder is unremarkable. Stomach/Bowel: Stomach is within normal limits. Appendix appears normal. No evidence of bowel wall thickening, distention, or inflammatory changes. Descending and sigmoid diverticula. Vascular/Lymphatic: Aortic atherosclerosis. No enlarged abdominal or pelvic lymph nodes. Reproductive: Mild prostatomegaly. Other: No abdominal wall hernia or abnormality. No abdominopelvic ascites. Musculoskeletal: No acute or significant osseous findings. IMPRESSION: 1. Unchanged spiculated mass of the anterior left upper lobe. 2. Unchanged spiculated soft tissue nodule or lymph node in the left axilla. 3. Unchanged perihilar radiation fibrosis and volume loss of the right lung. 4. No evidence of metastatic disease in the abdomen or pelvis. 5. Nonobstructive bilateral nephrolithiasis. 6. Coronary artery disease. Aortic Atherosclerosis (ICD10-I70.0). Electronically Signed   By: Eddie Candle M.D.   On: 01/18/2021  10:27   ECHOCARDIOGRAM COMPLETE  Result Date: 01/10/2021    ECHOCARDIOGRAM REPORT   Patient Name:   Travis Padilla Date of Exam: 01/10/2021 Medical Rec #:  [id number]     Height:       74.0 in Accession #:    [id number]    Weight:       240.0 lb Date of Birth:  03/28/1946    BSA:          2.349 m Patient Age:    51 years      BP:           117/66 mmHg Patient Gender: M             HR:           68 bpm. Exam Location:  [facility] Procedure: 2D Echo, Cardiac Doppler, Color Doppler and Intracardiac            Opacification Agent Indications:    R06.00 Dyspnea  History:        Patient has prior history of Echocardiogram examinations, most                 recent 02/21/2015. CAD; Risk Factors:Hypertension and                 Dyslipidemia. Papitations. Dyspnea on exertion.  Sonographer:    Diamond Nickel RCS Referring Phys: [place]  1. Left ventricular ejection fraction, by estimation, is 50 to 55%. The left ventricle has low normal function. The left  ventricle has no regional wall motion abnormalities. There is mild concentric left ventricular hypertrophy. Indeterminate diastolic filling due to E-A fusion.  2. Right ventricular systolic function is normal. The right ventricular size is normal. Tricuspid regurgitation signal is inadequate for assessing PA pressure.  3. The mitral valve is grossly normal. No evidence of mitral valve regurgitation. No evidence of mitral stenosis.  4. The aortic valve is tricuspid. Aortic valve regurgitation is trivial. No aortic stenosis is present.  5. The inferior vena cava is normal in size with greater than 50% respiratory variability, suggesting right atrial pressure of 3 mmHg. Comparison(s): No significant change from prior study. FINDINGS  Left Ventricle: Left ventricular ejection fraction, by estimation, is 50 to 55%. The left ventricle has low normal function. The left ventricle has no regional wall motion abnormalities. Definity contrast agent was given IV  to delineate the left ventricular endocardial borders. The left ventricular internal cavity size was normal in size. There is mild concentric left ventricular hypertrophy. Indeterminate diastolic filling due to E-A fusion. Right Ventricle: The right ventricular size is normal. No increase in right ventricular wall thickness. Right ventricular systolic function is normal. Tricuspid regurgitation signal is inadequate for assessing PA pressure. Left Atrium: Left atrial size was normal in size. Right Atrium: Right atrial size was normal in size. Pericardium: Trivial pericardial effusion is present. Mitral Valve: The mitral valve is grossly normal. No evidence of mitral valve regurgitation. No evidence of mitral valve stenosis. Tricuspid Valve: The tricuspid valve is grossly normal. Tricuspid valve regurgitation is trivial. No evidence of tricuspid stenosis. Aortic Valve: The aortic valve is tricuspid. Aortic valve regurgitation is trivial. No aortic stenosis is present. Pulmonic Valve: The pulmonic valve was grossly normal. Pulmonic valve regurgitation is not visualized. No evidence of pulmonic stenosis. Aorta: The aortic root and ascending aorta are structurally normal, with no evidence of dilitation. Venous: The inferior vena cava is normal in size with greater than 50% respiratory variability, suggesting right atrial pressure of 3 mmHg. IAS/Shunts: The atrial septum is grossly normal. EKG: Rhythm strip during this exam demostrated normal sinus rhythm and premature ventricular contractions.  LEFT VENTRICLE PLAX 2D LVIDd:         4.80 cm  Diastology LVIDs:         3.70 cm  LV e' medial:    5.77 cm/s LV PW:         1.40 cm  LV E/e' medial:  6.0 LV IVS:        1.30 cm  LV e' lateral:   8.20 cm/s LVOT diam:     2.20 cm  LV E/e' lateral: 4.2 LV SV:         58 LV SV Index:   25 LVOT Area:     3.80 cm  RIGHT VENTRICLE RV Basal diam:  3.10 cm RV S prime:     8.38 cm/s TAPSE (M-mode): 1.8 cm LEFT ATRIUM           Index        RIGHT ATRIUM           Index LA diam:      3.20 cm 1.36 cm/m  RA Area:     16.70 cm LA Vol (A2C): 72.5 ml 30.87 ml/m RA Volume:   43.40 ml  18.48 ml/m LA Vol (A4C): 56.2 ml 23.93 ml/m  AORTIC VALVE LVOT Vmax:   70.03 cm/s LVOT Vmean:  45.600 cm/s LVOT VTI:    0.152 m  AORTA Ao Root  diam: 3.70 cm MITRAL VALVE MV Area (PHT): 3.82 cm    SHUNTS MV Decel Time: 199 msec    Systemic VTI:  0.15 m MV E velocity: 34.57 cm/s  Systemic Diam: 2.20 cm MV A velocity: 76.40 cm/s MV E/A ratio:  0.45 Eleonore Chiquito MD Electronically signed by Eleonore Chiquito MD Signature Date/Time: 01/10/2021/7:08:42 PM    Final     ASSESSMENT AND PLAN: This is a very pleasant 75 years old white male with very light most smoking history recently diagnosed with stage IV (T3, N0, M1c) non-small cell lung cancer, squamous cell carcinoma based on the biopsy from the left axillary mass. He status post 4 cycles of induction systemic chemotherapy with carboplatin, paclitaxel and Keytruda with partial response.  The patient is currently on maintenance treatment with single agent Keytruda status post 41 cycles. The patient has been tolerating this treatment well but recently was admitted to the hospital with encephalitis of unclear etiology could be related to a tick bite but also immunotherapy mediated encephalitis could not be excluded at this point. He is currently on observation. The patient had repeat CT scan of the chest, abdomen pelvis performed recently.  I personally and independently reviewed the scans and discussed the results with the patient today. Has a scan showed no concerning findings for disease progression. I recommended for him to continue on observation with repeat CT scan of the chest, abdomen and pelvis in 3 months. For the history of hypothyroidism, he will continue on levothyroxine and we will monitor his TSH with the upcoming blood work. For the headache, the patient was advised to see neurology for further  evaluation. He was advised to call immediately if he has any other concerning symptoms in the interval. The patient voices understanding of current disease status and treatment options and is in agreement with the current care plan. All questions were answered. The patient knows to call the clinic with any problems, questions or concerns. We can certainly see the patient much sooner if necessary.  Disclaimer: This note was dictated with voice recognition software. Similar sounding words can inadvertently be transcribed and may not be corrected upon review.

## 2021-01-20 ENCOUNTER — Other Ambulatory Visit (HOSPITAL_COMMUNITY): Payer: Medicare Other

## 2021-01-20 ENCOUNTER — Telehealth: Payer: Self-pay | Admitting: Internal Medicine

## 2021-01-20 NOTE — Telephone Encounter (Signed)
Scheduled per los. Called and spoke with patient. Confirmed appt 

## 2021-01-21 ENCOUNTER — Other Ambulatory Visit: Payer: Self-pay | Admitting: Internal Medicine

## 2021-01-21 DIAGNOSIS — C3491 Malignant neoplasm of unspecified part of right bronchus or lung: Secondary | ICD-10-CM

## 2021-01-29 ENCOUNTER — Other Ambulatory Visit: Payer: Self-pay | Admitting: Family Medicine

## 2021-01-29 DIAGNOSIS — K219 Gastro-esophageal reflux disease without esophagitis: Secondary | ICD-10-CM

## 2021-02-13 ENCOUNTER — Other Ambulatory Visit: Payer: Self-pay | Admitting: Internal Medicine

## 2021-02-13 ENCOUNTER — Other Ambulatory Visit: Payer: Self-pay

## 2021-02-13 ENCOUNTER — Other Ambulatory Visit: Payer: Medicare Other

## 2021-02-13 DIAGNOSIS — E039 Hypothyroidism, unspecified: Secondary | ICD-10-CM | POA: Diagnosis not present

## 2021-02-13 DIAGNOSIS — C3490 Malignant neoplasm of unspecified part of unspecified bronchus or lung: Secondary | ICD-10-CM | POA: Diagnosis not present

## 2021-02-13 DIAGNOSIS — I10 Essential (primary) hypertension: Secondary | ICD-10-CM | POA: Diagnosis not present

## 2021-02-13 DIAGNOSIS — C3491 Malignant neoplasm of unspecified part of right bronchus or lung: Secondary | ICD-10-CM

## 2021-02-13 DIAGNOSIS — E782 Mixed hyperlipidemia: Secondary | ICD-10-CM | POA: Diagnosis not present

## 2021-02-13 DIAGNOSIS — Z5181 Encounter for therapeutic drug level monitoring: Secondary | ICD-10-CM | POA: Diagnosis not present

## 2021-02-13 NOTE — Addendum Note (Signed)
Addended by: Reather Converse on: 02/13/2021 09:07 AM   Modules accepted: Orders

## 2021-02-14 DIAGNOSIS — Z23 Encounter for immunization: Secondary | ICD-10-CM | POA: Diagnosis not present

## 2021-02-14 LAB — LIPID PANEL
Chol/HDL Ratio: 2.7 ratio (ref 0.0–5.0)
Cholesterol, Total: 96 mg/dL — ABNORMAL LOW (ref 100–199)
HDL: 36 mg/dL — ABNORMAL LOW (ref >39)
LDL Chol Calc (NIH): 38 mg/dL (ref 0–99)
Triglycerides: 119 mg/dL (ref 0–149)
VLDL Cholesterol Cal: 22 mg/dL (ref 5–40)

## 2021-02-14 LAB — TSH: TSH: 0.012 u[IU]/mL — ABNORMAL LOW (ref 0.450–4.500)

## 2021-03-13 ENCOUNTER — Other Ambulatory Visit: Payer: Self-pay | Admitting: Physician Assistant

## 2021-03-13 DIAGNOSIS — C3491 Malignant neoplasm of unspecified part of right bronchus or lung: Secondary | ICD-10-CM

## 2021-03-14 ENCOUNTER — Encounter: Payer: Self-pay | Admitting: Internal Medicine

## 2021-03-14 DIAGNOSIS — M5136 Other intervertebral disc degeneration, lumbar region: Secondary | ICD-10-CM | POA: Diagnosis not present

## 2021-03-14 DIAGNOSIS — M5030 Other cervical disc degeneration, unspecified cervical region: Secondary | ICD-10-CM | POA: Diagnosis not present

## 2021-03-20 ENCOUNTER — Encounter: Payer: Self-pay | Admitting: Pulmonary Disease

## 2021-03-20 ENCOUNTER — Ambulatory Visit (INDEPENDENT_AMBULATORY_CARE_PROVIDER_SITE_OTHER): Payer: Medicare Other | Admitting: Pulmonary Disease

## 2021-03-20 ENCOUNTER — Other Ambulatory Visit: Payer: Self-pay

## 2021-03-20 VITALS — BP 118/72 | HR 70 | Ht 74.0 in | Wt 234.0 lb

## 2021-03-20 DIAGNOSIS — R059 Cough, unspecified: Secondary | ICD-10-CM | POA: Diagnosis not present

## 2021-03-20 DIAGNOSIS — C3491 Malignant neoplasm of unspecified part of right bronchus or lung: Secondary | ICD-10-CM | POA: Diagnosis not present

## 2021-03-20 DIAGNOSIS — J42 Unspecified chronic bronchitis: Secondary | ICD-10-CM | POA: Diagnosis not present

## 2021-03-20 DIAGNOSIS — J432 Centrilobular emphysema: Secondary | ICD-10-CM | POA: Diagnosis not present

## 2021-03-20 DIAGNOSIS — J449 Chronic obstructive pulmonary disease, unspecified: Secondary | ICD-10-CM | POA: Diagnosis not present

## 2021-03-20 MED ORDER — TRELEGY ELLIPTA 100-62.5-25 MCG/INH IN AEPB: 1.0000 | INHALATION_SPRAY | Freq: Every day | RESPIRATORY_TRACT | 6 refills | Status: DC

## 2021-03-20 NOTE — Progress Notes (Signed)
Synopsis: Referred in January 2022 for lung cancer by [name], DO  Subjective:   PATIENT ID: Travis Padilla: male DOB: 1945/11/21, MRN: [id number]  Chief Complaint  Patient presents with   Cancer    Has not had treatment since May.  Patient is feeling better.    This is a 75 year old with a history of metastatic lung cancer, stage IV squamous cell carcinoma of the lung.,  Coronary artery disease, hypertension hyperlipidemia.  Former smoker quit in 1970.  Patient was formally established with Dr. Lake Bells in 2019.  Last seen in our office in October 2021 by Wyn Quaker, NP.  Pulmonary function test with no significant obstruction.  Last office visit with oncology in October 2021.  Patient had stage IV T3, N0, M1 C non-small cell lung cancer based off a biopsy from left axillary mass.  He had 4 cycles of induction chemotherapy with carboplatinum plus paclitaxel followed by Beryle Flock.  Last office visit treated for sinusitis.  Recommending follow-up and establish care with myself after last office visit with lung cancer history diagnosis.  OV 09/20/2020: Complains today of ongoing cough and sputum production.  He had pulmonary function test in the past with no significant obstruction but he has had radiation still has a mass present within the chest longstanding history of smoking.  Has ongoing symptoms consistent with chronic bronchitis.  Likely has issues regarding this has not been on longstanding bronchodilators for any period of time.  Uses over-the-counter cough medications as well as Flonase for nasal congestion.  He has been evaluated by ENT in the past  OV 03/20/2021: Here today for 54-month follow-up. Since last office visit has had continued follow-up with medical oncology Dr. Earlie Server.  Last office note 01/19/2021 reviewed.  Remained on maintenance Keytruda immunotherapy until was admitted to the hospital with concern of encephalitis in March 2022.  Unsure if this was related  to a tick bite versus the Keytruda.  Most recent CT imaging showed no progression of disease. He is off immunotherapy.  He took himself off Trelegy.  He does feel more fatigued and short of breath at times.  He is going to the gym regularly.    Past Medical History:  Diagnosis Date   Abnormal nuclear stress test    December, 2013   Anemia    Axillary adenopathy 04/24/2018   CAD (coronary artery disease)    Mild nonobstructive plaque in cath 2013   Chest pain    December, 2013   Cough with hemoptysis 04/22/2018   Dizziness    Dyslipidemia 07/14/2019   Encounter for antineoplastic immunotherapy 05/13/2018   Gout    Hemorrhoid    Hyperlipidemia    Hypertension    Hypothyroidism (acquired) 08/11/2018   Metastasis to lung ([place]) 05/19/2018   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) ([place]) dx'd 04/17/18   to LN, infrahilar mass, lung nodule and lt axilla   Obesity, unspecified 03/28/2009   Qualifier: Diagnosis of  By: [name], MD, [name]     Palpitations 03/28/2009   Qualifier: Diagnosis of  By: [name], MD, [name]     Right lower lobe lung mass 04/22/2018   Skin cancer    SNORING 03/28/2009   Qualifier: Diagnosis of  By: [name], MD, [name]     Spinal stenosis    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[place]) 05/13/2018     Family History  Problem Relation Age of Onset   Heart attack  Father    Heart failure Mother    Parkinson's disease Mother    Healthy Sister    Skin cancer Sister    Neuropathy Brother    COPD Brother    Epilepsy Brother    Healthy Sister    Healthy Sister    Colon cancer Neg Hx      Past Surgical History:  Procedure Laterality Date   basal skin cancer N/A 2019   Nose   BELPHAROPTOSIS REPAIR Bilateral    CATARACT EXTRACTION W/ INTRAOCULAR LENS  IMPLANT, BILATERAL     COLONOSCOPY N/A 11/03/2014   Procedure: COLONOSCOPY;  Surgeon: Rogene Houston, MD;  Location: [facility];  Service: Endoscopy;  Laterality: N/A;  1225    IR CV LINE INJECTION  08/24/2020   IR FLUORO GUIDED NEEDLE PLC ASPIRATION/INJECTION LOC  11/28/2020   IR IMAGING GUIDED PORT INSERTION  07/11/2018   KNEE CARTILAGE SURGERY Left    Left knee   SKIN CANCER EXCISION  12/2014, 04/25/15   VIDEO BRONCHOSCOPY Bilateral 05/09/2018   Procedure: VIDEO BRONCHOSCOPY WITH FLUORO;  Surgeon: Juanito Doom, MD;  Location: [facility];  Service: Cardiopulmonary;  Laterality: Bilateral;    Social History   Socioeconomic History   Marital status: Married    Spouse name: Not on file   Number of children: 1   Years of education: Not on file   Highest education level: Not on file  Occupational History   Occupation: Government social research officer  Tobacco Use   Smoking status: Former    Packs/day: 0.20    Years: 2.00    Pack years: 0.40    Types: Cigarettes    Start date: 10/01/1968    Quit date: 1972    Years since quitting: 50.5   Smokeless tobacco: Never  Vaping Use   Vaping Use: Never used  Substance and Sexual Activity   Alcohol use: No    Comment: hx of    Drug use: No   Sexual activity: Not on file  Other Topics Concern   Not on file  Social History Narrative   Unable to ask intimate partner violence questions, wife present   Social Determinants of Health   Financial Resource Strain: Not on file  Food Insecurity: Not on file  Transportation Needs: Not on file  Physical Activity: Not on file  Stress: Not on file  Social Connections: Not on file  Intimate Partner Violence: Not on file     Allergies  Allergen Reactions   Pravastatin     Numbness in feet    Iodine Hives and Rash    CT scan contrast caused rash and itching      Outpatient Medications Prior to Visit  Medication Sig Dispense Refill   acetaminophen (TYLENOL) 500 MG tablet Take 1,000 mg by mouth every 6 (six) hours as needed.     Alirocumab (PRALUENT) 150 MG/ML SOAJ Inject 150 mg into the skin every 14 (fourteen) days. 2 pen 11   ALPRAZolam (XANAX) 0.25 MG tablet Take 1  tablet (0.25 mg total) by mouth at bedtime as needed for anxiety. 30 tablet 0   Artificial Tear Solution (SOOTHE XP OP) Place 1 drop into both eyes 2 (two) times daily.     carvedilol (COREG) 12.5 MG tablet Take 1 tablet (12.5 mg total) by mouth in the morning, at noon, and at bedtime. 270 tablet 3   diphenhydrAMINE (BENADRYL) 50 MG tablet Take 1 tablet (50 mg total) by mouth as directed. Take 1 tablet one  hour prior to Ct scans 30 tablet 0   fluticasone (FLONASE) 50 MCG/ACT nasal spray Place 1 spray into both nostrils daily. 16 g 3   Fluticasone-Umeclidin-Vilant (TRELEGY ELLIPTA) 100-62.5-25 MCG/INH AEPB Inhale 1 puff into the lungs daily. 60 each 6   levothyroxine (SYNTHROID) 175 MCG tablet TAKE 1 TABLET BY MOUTH EVERY DAY BEFORE BREAKFAST 30 tablet 1   lidocaine-prilocaine (EMLA) cream Apply 1 application topically as needed. 30 g 1   methocarbamol (ROBAXIN) 500 MG tablet Take 500 mg by mouth daily.     pantoprazole (PROTONIX) 40 MG tablet TAKE 1 TABLET (40 MG TOTAL) BY MOUTH DAILY FOR 2 WEEKS PER FLAREUP OF REFLUX 90 tablet 0   predniSONE (DELTASONE) 50 MG tablet Take 1 tablet (50 mg total) by mouth as directed. Take one tablet 13 hours , one tablet 7 hours and one tablet  1 hour prior to scan 3 tablet 3   Rimegepant Sulfate (NURTEC) 75 MG TBDP Dissolve 1 tablet in mouth at onset of migraine ONCE daily if needed. 2 tablet 0   senna-docusate (SENNA S) 8.6-50 MG tablet 1 to 2 twice daily for constipation 120 tablet 5   tamsulosin (FLOMAX) 0.4 MG CAPS capsule Take 0.4 mg by mouth at bedtime.     No facility-administered medications prior to visit.    Review of Systems  Constitutional:  Negative for chills, fever, malaise/fatigue and weight loss.  HENT:  Negative for hearing loss, sore throat and tinnitus.   Eyes:  Negative for blurred vision and double vision.  Respiratory:  Positive for shortness of breath. Negative for cough, hemoptysis, sputum production, wheezing and stridor.    Cardiovascular:  Negative for chest pain, palpitations, orthopnea, leg swelling and PND.  Gastrointestinal:  Negative for abdominal pain, constipation, diarrhea, heartburn, nausea and vomiting.  Genitourinary:  Negative for dysuria, hematuria and urgency.  Musculoskeletal:  Negative for joint pain and myalgias.  Skin:  Negative for itching and rash.  Neurological:  Negative for dizziness, tingling, weakness and headaches.  Endo/Heme/Allergies:  Negative for environmental allergies. Does not bruise/bleed easily.  Psychiatric/Behavioral:  Negative for depression. The patient is not nervous/anxious and does not have insomnia.   All other systems reviewed and are negative.   Objective:  Physical Exam Vitals reviewed.  Constitutional:      General: He is not in acute distress.    Appearance: He is well-developed.  HENT:     Head: Normocephalic and atraumatic.  Eyes:     General: No scleral icterus.    Conjunctiva/sclera: Conjunctivae normal.     Pupils: Pupils are equal, round, and reactive to light.  Neck:     Vascular: No JVD.     Trachea: No tracheal deviation.  Cardiovascular:     Rate and Rhythm: Normal rate and regular rhythm.     Heart sounds: Normal heart sounds. No murmur heard. Pulmonary:     Effort: Pulmonary effort is normal. No tachypnea, accessory muscle usage or respiratory distress.     Breath sounds: No stridor. No wheezing, rhonchi or rales.  Abdominal:     General: Bowel sounds are normal. There is no distension.     Palpations: Abdomen is soft.     Tenderness: There is no abdominal tenderness.  Musculoskeletal:        General: No tenderness.     Cervical back: Neck supple.  Lymphadenopathy:     Cervical: No cervical adenopathy.  Skin:    General: Skin is warm and dry.  Capillary Refill: Capillary refill takes less than 2 seconds.     Findings: No rash.  Neurological:     Mental Status: He is alert and oriented to person, place, and time.   Psychiatric:        Behavior: Behavior normal.     Vitals:   03/20/21 0853  BP: 118/72  Pulse: 70  SpO2: 98%  Weight: 234 lb (106.1 kg)  Height: 6\' 2"  (1.88 m)    98% on RA BMI Readings from Last 3 Encounters:  03/20/21 30.04 kg/m  01/19/21 30.72 kg/m  01/17/21 30.30 kg/m   Wt Readings from Last 3 Encounters:  03/20/21 234 lb (106.1 kg)  01/19/21 239 lb 4.8 oz (108.5 kg)  01/17/21 236 lb (107 kg)     CBC    Component Value Date/Time   WBC 5.6 01/16/2021 1240   WBC 5.6 12/02/2020 0106   RBC 3.90 (L) 01/16/2021 1240   HGB 12.4 (L) 01/16/2021 1240   HGB 13.0 12/04/2017 1202   HCT 36.6 (L) 01/16/2021 1240   HCT NOT PERFORMED 11/27/2020 0825   PLT 159 01/16/2021 1240   PLT 218 12/04/2017 1202   MCV 93.8 01/16/2021 1240   MCV 92 12/04/2017 1202   MCH 31.8 01/16/2021 1240   MCHC 33.9 01/16/2021 1240   RDW 11.9 01/16/2021 1240   RDW 13.8 12/04/2017 1202   LYMPHSABS 1.1 01/16/2021 1240   LYMPHSABS 1.8 12/04/2017 1202   MONOABS 0.8 01/16/2021 1240   EOSABS 0.2 01/16/2021 1240   EOSABS 0.2 12/04/2017 1202   BASOSABS 0.1 01/16/2021 1240   BASOSABS 0.0 12/04/2017 1202    Chest Imaging: 08/15/2020 CT chest: Interval enlargement of the anterior left upper lobe mass looks like its growing in size worsened malignancy in this location. The patient's images have been independently reviewed by me.    01/18/2021 CT Chest: Stable unchanged masses within the change. Imaging stable. Reviewed with patient  The patient's images have been independently reviewed by me.     Pulmonary Functions Testing Results: PFT Results Latest Ref Rng & Units 05/30/2018  FVC-Pre L 4.40  FVC-Predicted Pre % 93  FVC-Post L 4.26  FVC-Predicted Post % 90  Pre FEV1/FVC % % 80  Post FEV1/FCV % % 80  FEV1-Pre L 3.52  FEV1-Predicted Pre % 101  FEV1-Post L 3.40  DLCO uncorrected ml/min/mmHg 25.71  DLCO UNC% % 73  DLCO corrected ml/min/mmHg 27.59  DLCO COR %Predicted % 78  DLVA Predicted %  86    FeNO:   Pathology:   Echocardiogram:   Heart Catheterization:     Assessment & Plan:     ICD-10-CM   1. 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 Cough  R05.9     3. Chronic bronchitis, unspecified chronic bronchitis type ([place])  J42     4. Centrilobular emphysema ([place])  J43.2     5. Chronic obstructive pulmonary disease, unspecified COPD type ([place])  J44.9       Discussion:  This is a 75 year old gentleman, stage IV squamous cell carcinoma of the lung status post chemotherapy plus radiation was on maintenance Keytruda until admitted to the hospital for concern of encephalitis.  Overall doing much better now.  He is off of Keytruda.  He has follow-up imaging with Dr. Earlie Server in August.  From a respiratory standpoint unfortunately he stopped taking his Trelegy.  He feels more fatigued more worn out during the end of the day and  short of breath when he is at the gym.  Plan: Would recommend that he restarts his Trelegy. New refills submitted to pharmacy I do suspect that this is making him breathe better especially when he is out trying to complete his activities of daily living. As needed albuterol for shortness of breath and wheezing Follow-up with Dr. Earlie Server is already been scheduled.    Current Outpatient Medications:    acetaminophen (TYLENOL) 500 MG tablet, Take 1,000 mg by mouth every 6 (six) hours as needed., Disp: , Rfl:    Alirocumab (PRALUENT) 150 MG/ML SOAJ, Inject 150 mg into the skin every 14 (fourteen) days., Disp: 2 pen, Rfl: 11   ALPRAZolam (XANAX) 0.25 MG tablet, Take 1 tablet (0.25 mg total) by mouth at bedtime as needed for anxiety., Disp: 30 tablet, Rfl: 0   Artificial Tear Solution (SOOTHE XP OP), Place 1 drop into both eyes 2 (two) times daily., Disp: , Rfl:    carvedilol (COREG) 12.5 MG tablet, Take 1 tablet (12.5 mg total) by mouth in the morning, at noon, and at bedtime., Disp: 270 tablet, Rfl: 3   diphenhydrAMINE  (BENADRYL) 50 MG tablet, Take 1 tablet (50 mg total) by mouth as directed. Take 1 tablet one hour prior to Ct scans, Disp: 30 tablet, Rfl: 0   fluticasone (FLONASE) 50 MCG/ACT nasal spray, Place 1 spray into both nostrils daily., Disp: 16 g, Rfl: 3   Fluticasone-Umeclidin-Vilant (TRELEGY ELLIPTA) 100-62.5-25 MCG/INH AEPB, Inhale 1 puff into the lungs daily., Disp: 60 each, Rfl: 6   levothyroxine (SYNTHROID) 175 MCG tablet, TAKE 1 TABLET BY MOUTH EVERY DAY BEFORE BREAKFAST, Disp: 30 tablet, Rfl: 1   lidocaine-prilocaine (EMLA) cream, Apply 1 application topically as needed., Disp: 30 g, Rfl: 1   methocarbamol (ROBAXIN) 500 MG tablet, Take 500 mg by mouth daily., Disp: , Rfl:    pantoprazole (PROTONIX) 40 MG tablet, TAKE 1 TABLET (40 MG TOTAL) BY MOUTH DAILY FOR 2 WEEKS PER FLAREUP OF REFLUX, Disp: 90 tablet, Rfl: 0   predniSONE (DELTASONE) 50 MG tablet, Take 1 tablet (50 mg total) by mouth as directed. Take one tablet 13 hours , one tablet 7 hours and one tablet  1 hour prior to scan, Disp: 3 tablet, Rfl: 3   Rimegepant Sulfate (NURTEC) 75 MG TBDP, Dissolve 1 tablet in mouth at onset of migraine ONCE daily if needed., Disp: 2 tablet, Rfl: 0   senna-docusate (SENNA S) 8.6-50 MG tablet, 1 to 2 twice daily for constipation, Disp: 120 tablet, Rfl: 5   tamsulosin (FLOMAX) 0.4 MG CAPS capsule, Take 0.4 mg by mouth at bedtime., Disp: , Rfl:     Garner Nash, DO [facility] 03/20/2021 9:36 AM

## 2021-03-20 NOTE — Patient Instructions (Signed)
Thank you for visiting Dr. Valeta Harms at [facility]. Today we recommend the following:  Would restart trelegy  Use albuterol as needed   Return in about 6 months (around 09/20/2021) for with APP or Dr. Valeta Harms.    Please do your part to reduce the spread of COVID-19.

## 2021-03-22 ENCOUNTER — Ambulatory Visit (INDEPENDENT_AMBULATORY_CARE_PROVIDER_SITE_OTHER): Payer: Medicare Other | Admitting: Neurology

## 2021-03-22 ENCOUNTER — Other Ambulatory Visit: Payer: Self-pay

## 2021-03-22 DIAGNOSIS — G4720 Circadian rhythm sleep disorder, unspecified type: Secondary | ICD-10-CM

## 2021-03-22 DIAGNOSIS — R0683 Snoring: Secondary | ICD-10-CM | POA: Diagnosis not present

## 2021-03-22 DIAGNOSIS — Z8669 Personal history of other diseases of the nervous system and sense organs: Secondary | ICD-10-CM

## 2021-03-22 DIAGNOSIS — Z9189 Other specified personal risk factors, not elsewhere classified: Secondary | ICD-10-CM

## 2021-03-22 DIAGNOSIS — R519 Headache, unspecified: Secondary | ICD-10-CM

## 2021-03-22 DIAGNOSIS — G4761 Periodic limb movement disorder: Secondary | ICD-10-CM

## 2021-03-22 DIAGNOSIS — G479 Sleep disorder, unspecified: Secondary | ICD-10-CM

## 2021-03-22 DIAGNOSIS — R0681 Apnea, not elsewhere classified: Secondary | ICD-10-CM

## 2021-03-22 DIAGNOSIS — G4733 Obstructive sleep apnea (adult) (pediatric): Secondary | ICD-10-CM

## 2021-03-22 DIAGNOSIS — R9431 Abnormal electrocardiogram [ECG] [EKG]: Secondary | ICD-10-CM

## 2021-03-22 DIAGNOSIS — Z820 Family history of epilepsy and other diseases of the nervous system: Secondary | ICD-10-CM

## 2021-03-29 DIAGNOSIS — M25571 Pain in right ankle and joints of right foot: Secondary | ICD-10-CM | POA: Diagnosis not present

## 2021-03-29 DIAGNOSIS — M25522 Pain in left elbow: Secondary | ICD-10-CM | POA: Diagnosis not present

## 2021-03-29 DIAGNOSIS — M79671 Pain in right foot: Secondary | ICD-10-CM | POA: Diagnosis not present

## 2021-04-03 DIAGNOSIS — M545 Low back pain, unspecified: Secondary | ICD-10-CM | POA: Diagnosis not present

## 2021-04-03 DIAGNOSIS — M542 Cervicalgia: Secondary | ICD-10-CM | POA: Diagnosis not present

## 2021-04-04 MED ORDER — ALBUTEROL SULFATE HFA 108 (90 BASE) MCG/ACT IN AERS: 2.0000 | INHALATION_SPRAY | Freq: Four times a day (QID) | RESPIRATORY_TRACT | 6 refills | Status: DC | PRN

## 2021-04-04 NOTE — Telephone Encounter (Signed)
Dr. Valeta Harms please advise on the following My Chart message:   Hi [name], Finding this hot weather limiting my breathing. While home I use the albuterol  nebulizer to relax the lungs but unfortunately does not help when away. For the times when traveling would appreciate your advice if using an inhaler while away from the home would help.  If you support request a script for an albuterol inhaler. Look forward to your recommendations. Kind regards, Travis Padilla  Thank you

## 2021-04-04 NOTE — Telephone Encounter (Signed)
Albuterol inhaler order placed and pt notified via My Chart message. Nothing further needed at this time.

## 2021-04-06 ENCOUNTER — Other Ambulatory Visit: Payer: Self-pay | Admitting: Physician Assistant

## 2021-04-06 DIAGNOSIS — C3491 Malignant neoplasm of unspecified part of right bronchus or lung: Secondary | ICD-10-CM

## 2021-04-06 NOTE — Addendum Note (Signed)
Addended by: Star Age on: 04/06/2021 07:25 PM   Modules accepted: Orders

## 2021-04-06 NOTE — Procedures (Signed)
PATIENT'S NAME:  Travis Padilla, Travis Padilla DOB:      [birth year]      MR#:    [id number]     DATE OF RECORDING: 03/22/2021 REFERRING M.D.:  Ronnie Doss, DO Study Performed:   Baseline Polysomnogram HISTORY: 75 year old man with a medical history of coronary artery disease, hypertension, hyperlipidemia, hypothyroidism, metastatic lung cancer, spinal stenosis, squamous cell cancer, gout, hospitalization in March 2022 for encephalopathy/encephalitis, suspected to be from tickborne illness, and borderline obesity, who reports snoring and restless sleep, sleep disruption, difficulty initiating and maintaining sleep. The patient endorsed the Epworth Sleepiness Scale at 3 points. The patient's weight 234 pounds with a height of 72 (inches), resulting in a BMI of 31.7 kg/m2. The patient's neck circumference measured 19 3/8 inches.  CURRENT MEDICATIONS: Tylenol, Praluent, Xanax, Artificial Tear Solution, Coreg, Flonase, Synthroid, Trelegy Ellipta, Emla, Robaxin, Protonix, Senna S.    PROCEDURE:  This is a multichannel digital polysomnogram utilizing the Somnostar 11.2 system.  Electrodes and sensors were applied and monitored per AASM Specifications.   EEG, EOG, Chin and Limb EMG, were sampled at 200 Hz.  ECG, Snore and Nasal Pressure, Thermal Airflow, Respiratory Effort, CPAP Flow and Pressure, Oximetry was sampled at 50 Hz. Digital video and audio were recorded.      BASELINE STUDY  Lights Out was at 22:04 and Lights On at 05:00.  Total recording time (TRT) was 417 minutes, with a total sleep time (TST) of 368 minutes.   The patient's sleep latency was 27.5 minutes.  REM latency was 244.5 minutes, which is markedly delayed. The sleep efficiency was 88.2 %.     SLEEP ARCHITECTURE: WASO (Wake after sleep onset) was 21.5 minutes with mild sleep fragmentation noted. There were 59 minutes in Stage N1, 239 minutes Stage N2, 44.5 minutes Stage N3 and 25.5 minutes in Stage REM.  The percentage of Stage N1 was 16.%, which is  increased, Stage N2 was 64.9%, which is increased, Stage N3 was 12.1% and Stage R (REM sleep) was 6.9%, which is significantly reduced. The arousals were noted as: 32 were spontaneous, 4 were associated with PLMs, 16 were associated with respiratory events.  RESPIRATORY ANALYSIS:  There were a total of 50 respiratory events:  34 obstructive apneas, 0 central apneas and 0 mixed apneas with a total of 34 apneas and an apnea index (AI) of 5.5 /hour. There were 16 hypopneas with a hypopnea index of 2.6 /hour. The patient also had 0 respiratory event related arousals (RERAs).      The total APNEA/HYPOPNEA INDEX (AHI) was 8.2/hour and the total RESPIRATORY DISTURBANCE INDEX was  8.2 /hour.  1 events occurred in REM sleep and 30 events in NREM. The REM AHI was  2.4 /hour, versus a non-REM AHI of 8.6. The patient spent 36 minutes of total sleep time in the supine position and 332 minutes in non-supine.. The supine AHI was 75.0 versus a non-supine AHI of 0.9.  OXYGEN SATURATION & C02:  The Wake baseline 02 saturation was 96%, with the lowest being 82%. Time spent below 89% saturation equaled 12 minutes.  PERIODIC LIMB MOVEMENTS: The patient had a total of 504 Periodic Limb Movements.  The Periodic Limb Movement (PLM) index was 82.2 and the PLM Arousal index was .7/hour.  Audio and video analysis did not show any abnormal or unusual movements, behaviors, phonations or vocalizations. Sleep slept with 2 pillows, slightly elevated, but could not sleep supine for an extended period of time. The patient took 1 bathroom break. Mild snoring  was noted. The EKG showed frequent PVCs and what appeared to be a brief, 9-beat run of ventricular tachycardia (epoch 221).  Post-study, the patient indicated that sleep was the same as usual.   IMPRESSION:  Obstructive Sleep Apnea (OSA) Periodic Limb Movement Disorder (PLMD) Dysfunctions associated with sleep stages or arousal from sleep Non-specific abnormal EKG,  non-sustained V-tach  RECOMMENDATIONS:  This study demonstrates overall mild obstructive sleep apnea, severe during supine sleep with a total AHI of 8.2/hour, supine AHI of 75/hour, and O2 nadir of 82%. The absence of supine REM sleep likely underestimates his AHI and O2 nadir. Given the patient's medical history and sleep related complaints, treatment with positive airway pressure is recommended; this can be achieved in the form of autoPAP. Alternatively, a full-night CPAP titration study would allow optimization of therapy if needed. Other treatment options may include avoidance of supine sleep position along with weight loss, upper airway or jaw surgery in selected patients or the use of an oral appliance in certain patients. ENT evaluation and/or consultation with a maxillofacial surgeon or dentist may be feasible in some instances.  Please note that untreated obstructive sleep apnea may carry additional perioperative morbidity. Patients with significant obstructive sleep apnea should receive perioperative PAP therapy and the surgeons and particularly the anesthesiologist should be informed of the diagnosis and the severity of the sleep disordered breathing. This study shows sleep fragmentation and abnormal sleep stage percentages; these are nonspecific findings and per se do not signify an intrinsic sleep disorder or a cause for the patient's sleep-related symptoms. Causes include (but are not limited to) the first night effect of the sleep study, circadian rhythm disturbances, medication effect or an underlying mood disorder or medical problem.  The study showed frequent PVCs and what appeared to be a brief, 9-beat run of ventricular tachycardia (epoch 221) on single lead EKG; clinical correlation is recommended. The patient is followed by cardiology; a sooner appointment may be feasible.  Severe PLMs (periodic limb movements of sleep) were noted during this study with no significant arousals; clinical  correlation is recommended.  The patient should be cautioned not to drive, work at heights, or operate dangerous or heavy equipment when tired or sleepy. Review and reiteration of good sleep hygiene measures should be pursued with any patient. The patient will be seen in follow-up by Dr. Rexene Alberts at [facility] for discussion of the test results and further management strategies. The referring provider will be notified of the test results.  I certify that I have reviewed the entire raw data recording prior to the issuance of this report in accordance with the Standards of Accreditation of the American Academy of Sleep Medicine (AASM)  Star Age, MD, PhD Diplomat, American Board of Neurology and Sleep Medicine (Neurology and Sleep Medicine)

## 2021-04-12 DIAGNOSIS — M25561 Pain in right knee: Secondary | ICD-10-CM | POA: Diagnosis not present

## 2021-04-14 ENCOUNTER — Encounter: Payer: Self-pay | Admitting: *Deleted

## 2021-04-14 ENCOUNTER — Telehealth: Payer: Self-pay | Admitting: *Deleted

## 2021-04-14 ENCOUNTER — Other Ambulatory Visit: Payer: Self-pay | Admitting: *Deleted

## 2021-04-14 DIAGNOSIS — R0609 Other forms of dyspnea: Secondary | ICD-10-CM

## 2021-04-14 DIAGNOSIS — I472 Ventricular tachycardia, unspecified: Secondary | ICD-10-CM

## 2021-04-14 DIAGNOSIS — I10 Essential (primary) hypertension: Secondary | ICD-10-CM

## 2021-04-14 DIAGNOSIS — R002 Palpitations: Secondary | ICD-10-CM

## 2021-04-14 DIAGNOSIS — R0600 Dyspnea, unspecified: Secondary | ICD-10-CM

## 2021-04-14 NOTE — Progress Notes (Unsigned)
To Dr [name] to sign

## 2021-04-14 NOTE — Telephone Encounter (Signed)
I think with this result I would like to do a Lexiscan Myoview to make sure that there is no ischemia on a perfusion study.   Order placed and pt aware he will be called to schedule.  Instructions to be sent to pt via MyChart

## 2021-04-17 ENCOUNTER — Telehealth (HOSPITAL_COMMUNITY): Payer: Self-pay | Admitting: *Deleted

## 2021-04-17 NOTE — Telephone Encounter (Signed)
Patient given detailed instructions per Myocardial Perfusion Study Information Sheet for the test on 04/24/21 at 8:15. Patient notified to arrive 15 minutes early and that it is imperative to arrive on time for appointment to keep from having the test rescheduled.  If you need to cancel or reschedule your appointment, please call the office within 24 hours of your appointment. . Patient verbalized understanding.Travis Padilla

## 2021-04-20 DIAGNOSIS — M25579 Pain in unspecified ankle and joints of unspecified foot: Secondary | ICD-10-CM | POA: Diagnosis not present

## 2021-04-20 DIAGNOSIS — M79671 Pain in right foot: Secondary | ICD-10-CM | POA: Diagnosis not present

## 2021-04-20 DIAGNOSIS — M25571 Pain in right ankle and joints of right foot: Secondary | ICD-10-CM | POA: Diagnosis not present

## 2021-04-21 ENCOUNTER — Ambulatory Visit (HOSPITAL_COMMUNITY)
Admission: RE | Admit: 2021-04-21 | Discharge: 2021-04-21 | Disposition: A | Discharge status: Home / Self Care | Payer: Medicare Other | Source: Ambulatory Visit | Attending: Internal Medicine | Admitting: Internal Medicine

## 2021-04-21 ENCOUNTER — Other Ambulatory Visit: Payer: Self-pay

## 2021-04-21 ENCOUNTER — Inpatient Hospital Stay: Payer: Medicare Other | Attending: Oncology

## 2021-04-21 DIAGNOSIS — R5383 Other fatigue: Secondary | ICD-10-CM

## 2021-04-21 DIAGNOSIS — I10 Essential (primary) hypertension: Secondary | ICD-10-CM | POA: Diagnosis not present

## 2021-04-21 DIAGNOSIS — I700 Atherosclerosis of aorta: Secondary | ICD-10-CM | POA: Diagnosis not present

## 2021-04-21 DIAGNOSIS — R918 Other nonspecific abnormal finding of lung field: Secondary | ICD-10-CM | POA: Diagnosis not present

## 2021-04-21 DIAGNOSIS — D0222 Carcinoma in situ of left bronchus and lung: Secondary | ICD-10-CM | POA: Insufficient documentation

## 2021-04-21 DIAGNOSIS — C3491 Malignant neoplasm of unspecified part of right bronchus or lung: Secondary | ICD-10-CM

## 2021-04-21 DIAGNOSIS — C3490 Malignant neoplasm of unspecified part of unspecified bronchus or lung: Secondary | ICD-10-CM

## 2021-04-21 DIAGNOSIS — E039 Hypothyroidism, unspecified: Secondary | ICD-10-CM | POA: Insufficient documentation

## 2021-04-21 DIAGNOSIS — N200 Calculus of kidney: Secondary | ICD-10-CM | POA: Diagnosis not present

## 2021-04-21 DIAGNOSIS — N281 Cyst of kidney, acquired: Secondary | ICD-10-CM | POA: Diagnosis not present

## 2021-04-21 LAB — CMP (CANCER CENTER ONLY)
ALT: 12 U/L (ref 0–44)
AST: 13 U/L — ABNORMAL LOW (ref 15–41)
Albumin: 4.1 g/dL (ref 3.5–5.0)
Alkaline Phosphatase: 69 U/L (ref 38–126)
Anion gap: 10 (ref 5–15)
BUN: 23 mg/dL (ref 8–23)
CO2: 23 mmol/L (ref 22–32)
Calcium: 9.6 mg/dL (ref 8.9–10.3)
Chloride: 104 mmol/L (ref 98–111)
Creatinine: 1.17 mg/dL (ref 0.61–1.24)
GFR, Estimated: >60 mL/min (ref >60)
Glucose, Bld: 158 mg/dL — ABNORMAL HIGH (ref 70–99)
Potassium: 4.9 mmol/L (ref 3.5–5.1)
Sodium: 137 mmol/L (ref 135–145)
Total Bilirubin: 0.6 mg/dL (ref 0.3–1.2)
Total Protein: 7.9 g/dL (ref 6.5–8.1)

## 2021-04-21 LAB — CBC WITH DIFFERENTIAL (CANCER CENTER ONLY)
Abs Immature Granulocytes: 0.04 10*3/uL (ref 0.00–0.07)
Basophils Absolute: 0 10*3/uL (ref 0.0–0.1)
Basophils Relative: 0 %
Eosinophils Absolute: 0 10*3/uL (ref 0.0–0.5)
Eosinophils Relative: 0 %
HCT: 39.3 % (ref 39.0–52.0)
Hemoglobin: 14 g/dL (ref 13.0–17.0)
Immature Granulocytes: 1 %
Lymphocytes Relative: 9 %
Lymphs Abs: 0.6 10*3/uL — ABNORMAL LOW (ref 0.7–4.0)
MCH: 31.3 pg (ref 26.0–34.0)
MCHC: 35.6 g/dL (ref 30.0–36.0)
MCV: 87.9 fL (ref 80.0–100.0)
Monocytes Absolute: 0.2 10*3/uL (ref 0.1–1.0)
Monocytes Relative: 2 %
Neutro Abs: 6.5 10*3/uL (ref 1.7–7.7)
Neutrophils Relative %: 88 %
Platelet Count: 172 10*3/uL (ref 150–400)
RBC: 4.47 MIL/uL (ref 4.22–5.81)
RDW: 11.7 % (ref 11.5–15.5)
WBC Count: 7.3 10*3/uL (ref 4.0–10.5)
nRBC: 0 % (ref 0.0–0.2)

## 2021-04-21 LAB — POCT I-STAT CREATININE: Creatinine, Ser: 1.1 mg/dL (ref 0.61–1.24)

## 2021-04-21 LAB — TSH: TSH: <0.08 u[IU]/mL — ABNORMAL LOW (ref 0.320–4.118)

## 2021-04-21 MED ORDER — IOHEXOL 350 MG/ML SOLN
100.0000 mL | Freq: Once | INTRAVENOUS | Status: AC | PRN
  Administered 2021-04-21: 80 mL via INTRAVENOUS

## 2021-04-24 ENCOUNTER — Other Ambulatory Visit: Payer: Self-pay

## 2021-04-24 ENCOUNTER — Other Ambulatory Visit: Payer: Self-pay | Admitting: Cardiology

## 2021-04-24 ENCOUNTER — Inpatient Hospital Stay (HOSPITAL_BASED_OUTPATIENT_CLINIC_OR_DEPARTMENT_OTHER): Payer: Medicare Other | Admitting: Internal Medicine

## 2021-04-24 ENCOUNTER — Ambulatory Visit (HOSPITAL_COMMUNITY): Payer: Medicare Other | Attending: Cardiovascular Disease

## 2021-04-24 VITALS — BP 120/62 | HR 89 | Temp 97.5°F | Resp 19 | Ht 74.0 in | Wt 235.1 lb

## 2021-04-24 DIAGNOSIS — C3490 Malignant neoplasm of unspecified part of unspecified bronchus or lung: Secondary | ICD-10-CM

## 2021-04-24 DIAGNOSIS — I10 Essential (primary) hypertension: Secondary | ICD-10-CM

## 2021-04-24 DIAGNOSIS — E039 Hypothyroidism, unspecified: Secondary | ICD-10-CM

## 2021-04-24 DIAGNOSIS — R002 Palpitations: Secondary | ICD-10-CM | POA: Diagnosis not present

## 2021-04-24 DIAGNOSIS — C3491 Malignant neoplasm of unspecified part of right bronchus or lung: Secondary | ICD-10-CM | POA: Diagnosis not present

## 2021-04-24 DIAGNOSIS — R0600 Dyspnea, unspecified: Secondary | ICD-10-CM | POA: Insufficient documentation

## 2021-04-24 DIAGNOSIS — R0609 Other forms of dyspnea: Secondary | ICD-10-CM

## 2021-04-24 DIAGNOSIS — D0222 Carcinoma in situ of left bronchus and lung: Secondary | ICD-10-CM | POA: Diagnosis not present

## 2021-04-24 LAB — MYOCARDIAL PERFUSION IMAGING
LV dias vol: 130 mL (ref 62–150)
LV sys vol: 70 mL
Peak HR: 97 {beats}/min
Rest HR: 68 {beats}/min
SDS: 2
SRS: 0
SSS: 2
TID: 0.97

## 2021-04-24 MED ORDER — REGADENOSON 0.4 MG/5ML IV SOLN
0.4000 mg | Freq: Once | INTRAVENOUS | Status: AC
  Administered 2021-04-24: 0.4 mg via INTRAVENOUS

## 2021-04-24 MED ORDER — TECHNETIUM TC 99M TETROFOSMIN IV KIT
30.8000 | PACK | Freq: Once | INTRAVENOUS | Status: AC | PRN
  Administered 2021-04-24: 30.8 via INTRAVENOUS
  Filled 2021-04-24: qty 31

## 2021-04-24 MED ORDER — TECHNETIUM TC 99M TETROFOSMIN IV KIT
9.8000 | PACK | Freq: Once | INTRAVENOUS | Status: AC | PRN
  Administered 2021-04-24: 9.8 via INTRAVENOUS
  Filled 2021-04-24: qty 10

## 2021-04-24 NOTE — Progress Notes (Signed)
[place] [phone]   [phone]  OFFICE PROGRESS NOTE  [name], DO [place] [address]  DIAGNOSIS: Stage IV (T3, N0, M1c) non-small cell lung cancer, squamous cell carcinoma presented with large right infrahilar mass in addition to left upper lobe lung nodule as well as left axillary mass with left axillary lymph node diagnosed in August 2019.  PRIOR THERAPY:  1) Palliative radiotherapy to the right infrahilar mass as well as the axillary mass under the care of Dr. Lisbeth Renshaw. 2) Systemic chemotherapy with carboplatin for AUC of 5, paclitaxel 175 mg/M2 and Keytruda 200 mg IV every 3 weeks status post 4 cycles. 3) Maintenance immunotherapy with single agent Keytruda 200 mg IV every 3 weeks status post 41 cycles.  His treatment is currently on hold secondary to intolerance.  CURRENT THERAPY: Observation.  INTERVAL HISTORY: Travis Padilla 75 y.o. male returns to the clinic today for 65-month follow-up visit.  The patient is feeling fine today with no concerning complaints except for fatigue and shortness of breath with exertion.  He recently underwent sleep study and was referred to cardiology for further evaluation.  He denied having any current chest pain, cough or hemoptysis.  He denied having any fever or chills.  He has no nausea, vomiting, diarrhea or constipation.  He has no headache or visual changes.  He is currently on levothyroxine 175 mcg p.o. daily.  The patient had repeat CT scan of the chest, abdomen pelvis performed recently and he is here for evaluation and discussion of his discuss results.  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  MEDICATIONS:  Current Outpatient Medications  Medication Sig Dispense Refill   acetaminophen (TYLENOL) 500 MG tablet Take 1,000 mg by mouth every 6 (six) hours as needed.     albuterol (VENTOLIN HFA) 108 (90 Base) MCG/ACT inhaler Inhale 2 puffs into the lungs every 6 (six) hours as needed for wheezing or shortness of breath. 8 g 6   Alirocumab (PRALUENT) 150 MG/ML SOAJ Inject 150 mg into the skin every 14 (fourteen) days. 2 pen 11   ALPRAZolam (XANAX) 0.25 MG tablet Take 1 tablet (0.25 mg total) by mouth at bedtime as needed for anxiety.  30 tablet 0   Artificial Tear Solution (SOOTHE XP OP) Place 1 drop into both eyes 2 (two) times daily.     carvedilol (COREG) 12.5 MG tablet Take 1 tablet (12.5 mg total) by mouth in the morning, at noon, and at bedtime. 270 tablet 3   diphenhydrAMINE (BENADRYL) 50 MG tablet Take 1 tablet (50 mg total) by mouth as directed. Take 1 tablet one hour prior to Ct scans 30 tablet 0   fluticasone (FLONASE) 50 MCG/ACT nasal spray Place 1 spray into both nostrils daily. 16 g 3   Fluticasone-Umeclidin-Vilant (TRELEGY ELLIPTA) 100-62.5-25 MCG/INH AEPB Inhale 1 puff into the lungs daily. 60 each 6    levothyroxine (SYNTHROID) 175 MCG tablet TAKE 1 TABLET BY MOUTH EVERY DAY BEFORE BREAKFAST 30 tablet 1   lidocaine-prilocaine (EMLA) cream Apply 1 application topically as needed. 30 g 1   methocarbamol (ROBAXIN) 500 MG tablet Take 500 mg by mouth daily.     pantoprazole (PROTONIX) 40 MG tablet TAKE 1 TABLET (40 MG TOTAL) BY MOUTH DAILY FOR 2 WEEKS PER FLAREUP OF REFLUX 90 tablet 0   predniSONE (DELTASONE) 50 MG tablet Take 1 tablet (50 mg total) by mouth as directed. Take one tablet 13 hours , one tablet 7 hours and one tablet  1 hour prior to scan 3 tablet 3   Rimegepant Sulfate (NURTEC) 75 MG TBDP Dissolve 1 tablet in mouth at onset of migraine ONCE daily if needed. 2 tablet 0   senna-docusate (SENNA S) 8.6-50 MG tablet 1 to 2 twice daily for constipation 120 tablet 5   tamsulosin (FLOMAX) 0.4 MG CAPS capsule Take 0.4 mg by mouth at bedtime.     No current facility-administered medications for this visit.    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   BELPHAROPTOSIS REPAIR Bilateral    CATARACT EXTRACTION W/ INTRAOCULAR LENS  IMPLANT, BILATERAL     COLONOSCOPY N/A 11/03/2014   Procedure: COLONOSCOPY;  Surgeon: Rogene Houston, MD;  Location: [facility];  Service: Endoscopy;  Laterality: N/A;  1225   IR CV LINE INJECTION  08/24/2020   IR FLUORO GUIDED NEEDLE PLC ASPIRATION/INJECTION LOC  11/28/2020   IR IMAGING GUIDED PORT INSERTION  07/11/2018   KNEE CARTILAGE SURGERY Left    Left knee   SKIN CANCER EXCISION  12/2014, 04/25/15   VIDEO BRONCHOSCOPY Bilateral 05/09/2018   Procedure: VIDEO BRONCHOSCOPY WITH FLUORO;  Surgeon: Juanito Doom, MD;  Location: [facility];  Service: Cardiopulmonary;  Laterality: Bilateral;    REVIEW OF SYSTEMS:  Constitutional: positive for fatigue Eyes: negative Ears, nose, mouth, throat, and face: negative Respiratory: positive for dyspnea on exertion Cardiovascular:  negative Gastrointestinal: negative Genitourinary:negative Integument/breast: negative Hematologic/lymphatic: negative Musculoskeletal:negative Neurological: negative Behavioral/Psych: negative Endocrine: negative Allergic/Immunologic: negative   PHYSICAL EXAMINATION: General appearance: alert, cooperative, fatigued, and no distress Head: Normocephalic, without obvious abnormality, atraumatic Neck: no adenopathy, no JVD, supple, symmetrical, trachea midline, and thyroid not enlarged, symmetric, no tenderness/mass/nodules Lymph nodes: Cervical, supraclavicular, and axillary nodes normal. Resp: clear to auscultation bilaterally Back: symmetric, no curvature. ROM normal. No CVA tenderness. Cardio: regular rate and rhythm, S1, S2 normal, no murmur, click, rub or gallop GI: soft, non-tender; bowel sounds normal; no masses,  no organomegaly Extremities: extremities normal, atraumatic, no cyanosis or edema Neurologic: Alert and oriented X 3, normal strength and tone. Normal symmetric reflexes. Normal coordination and gait  ECOG PERFORMANCE STATUS: 1 - Symptomatic but  completely ambulatory  Blood pressure 120/62, pulse 89, temperature (!) 97.5 F (36.4 C), temperature source Tympanic, resp. rate 19, height 6\' 2"  (1.88 m), weight 235 lb 1.6 oz (106.6 kg), SpO2 100 %.  LABORATORY DATA: Lab Results  Component Value Date   WBC 7.3 04/21/2021   HGB 14.0 04/21/2021   HCT 39.3 04/21/2021   MCV 87.9 04/21/2021   PLT 172 04/21/2021      Chemistry      Component Value Date/Time   NA 137 04/21/2021 1203   NA 144 04/09/2018 1507   K 4.9 04/21/2021 1203   CL 104 04/21/2021 1203   CO2 23 04/21/2021 1203   BUN 23 04/21/2021 1203   BUN 15 04/09/2018 1507   CREATININE 1.17 04/21/2021 1203   CREATININE 1.10 04/21/2021 1128      Component Value Date/Time   CALCIUM 9.6 04/21/2021 1203   ALKPHOS 69 04/21/2021 1203   AST 13 (L) 04/21/2021 1203   ALT 12 04/21/2021 1203   BILITOT 0.6  04/21/2021 1203       RADIOGRAPHIC STUDIES: CT Chest W Contrast  Result Date: 04/23/2021 CLINICAL DATA:  Restaging non-small cell lung cancer. Initial diagnosis 2019. Radiation and chemotherapy complete. Immunotherapy complete. EXAM: CT CHEST, ABDOMEN, AND PELVIS WITH CONTRAST TECHNIQUE: Multidetector CT imaging of the chest, abdomen and pelvis was performed following the standard protocol during bolus administration of intravenous contrast. CONTRAST:  45mL OMNIPAQUE IOHEXOL 350 MG/ML SOLN COMPARISON:  01/17/2021 FINDINGS: CT CHEST FINDINGS Cardiovascular: The heart is normal in size. No pericardial effusion. Stable tortuosity and scattered calcification of the thoracic aorta but no focal aneurysm or dissection. The branch vessels are patent. Stable three-vessel coronary artery calcifications. Mediastinum/Nodes: No mediastinal or hilar mass or adenopathy. The esophagus is unremarkable. Lungs/Pleura: Stable lobulated left upper lobe lung mass measuring approximately 2.9 x 2.3 cm. Stable radiation changes involving the right hilum and paramediastinal lung. No findings suspicious for recurrent tumor. Musculoskeletal: Stable 17 mm left axillary soft tissue lesion. No supraclavicular or axillary adenopathy. The bony structures are intact. No worrisome lytic or sclerotic bone lesions. CT ABDOMEN PELVIS FINDINGS Hepatobiliary: Stable left hepatic lobe cyst. No worrisome hepatic lesions or intrahepatic biliary dilatation. The gallbladder is unremarkable. No common bile duct dilatation. Pancreas: No mass, inflammation or ductal dilatation. Spleen: Normal size. No focal lesions. Adrenals/Urinary Tract: No adrenal gland lesions. Small bilateral renal calculi are again noted. No worrisome renal lesions or hydronephrosis. Stable small upper pole left renal cyst. The bladder is unremarkable. Stomach/Bowel: The stomach, duodenum, small bowel and colon are unremarkable. No acute inflammatory changes, mass lesions or  obstructive findings. The terminal ileum is normal. The appendix is normal. Moderate upper sigmoid colon and descending colon diverticulosis. Vascular/Lymphatic: Moderate aortic and iliac artery calcifications. The branch vessels are patent. The major venous structures are patent. No mesenteric or retroperitoneal mass or adenopathy. Reproductive: The prostate gland and seminal vesicles are unremarkable. Other: No pelvic mass or adenopathy. No free pelvic fluid collections. No inguinal mass or adenopathy. No abdominal wall hernia or subcutaneous lesions. Musculoskeletal: No significant bony findings. IMPRESSION: 1. Stable (over the past several CT scans) lobulated left upper lobe lung mass. 2. Stable radiation changes involving the right hilum and paramediastinal lung. No findings suspicious for recurrent tumor. 3. No mediastinal or hilar mass or adenopathy. 4. Stable 17 mm left axillary soft tissue lesion. 5. No findings for abdominal/pelvic metastatic disease. 6. Stable bilateral renal calculi. 7. Stable advanced atherosclerotic calcifications involving the thoracic and abdominal aorta and  branch vessels including the coronary arteries. 8. Aortic atherosclerosis. Aortic Atherosclerosis (ICD10-I70.0). Electronically Signed   By: Marijo Sanes M.D.   On: 04/23/2021 09:53   CT Abdomen Pelvis W Contrast  Result Date: 04/23/2021 CLINICAL DATA:  Restaging non-small cell lung cancer. Initial diagnosis 2019. Radiation and chemotherapy complete. Immunotherapy complete. EXAM: CT CHEST, ABDOMEN, AND PELVIS WITH CONTRAST TECHNIQUE: Multidetector CT imaging of the chest, abdomen and pelvis was performed following the standard protocol during bolus administration of intravenous contrast. CONTRAST:  95mL OMNIPAQUE IOHEXOL 350 MG/ML SOLN COMPARISON:  01/17/2021 FINDINGS: CT CHEST FINDINGS Cardiovascular: The heart is normal in size. No pericardial effusion. Stable tortuosity and scattered calcification of the thoracic aorta  but no focal aneurysm or dissection. The branch vessels are patent. Stable three-vessel coronary artery calcifications. Mediastinum/Nodes: No mediastinal or hilar mass or adenopathy. The esophagus is unremarkable. Lungs/Pleura: Stable lobulated left upper lobe lung mass measuring approximately 2.9 x 2.3 cm. Stable radiation changes involving the right hilum and paramediastinal lung. No findings suspicious for recurrent tumor. Musculoskeletal: Stable 17 mm left axillary soft tissue lesion. No supraclavicular or axillary adenopathy. The bony structures are intact. No worrisome lytic or sclerotic bone lesions. CT ABDOMEN PELVIS FINDINGS Hepatobiliary: Stable left hepatic lobe cyst. No worrisome hepatic lesions or intrahepatic biliary dilatation. The gallbladder is unremarkable. No common bile duct dilatation. Pancreas: No mass, inflammation or ductal dilatation. Spleen: Normal size. No focal lesions. Adrenals/Urinary Tract: No adrenal gland lesions. Small bilateral renal calculi are again noted. No worrisome renal lesions or hydronephrosis. Stable small upper pole left renal cyst. The bladder is unremarkable. Stomach/Bowel: The stomach, duodenum, small bowel and colon are unremarkable. No acute inflammatory changes, mass lesions or obstructive findings. The terminal ileum is normal. The appendix is normal. Moderate upper sigmoid colon and descending colon diverticulosis. Vascular/Lymphatic: Moderate aortic and iliac artery calcifications. The branch vessels are patent. The major venous structures are patent. No mesenteric or retroperitoneal mass or adenopathy. Reproductive: The prostate gland and seminal vesicles are unremarkable. Other: No pelvic mass or adenopathy. No free pelvic fluid collections. No inguinal mass or adenopathy. No abdominal wall hernia or subcutaneous lesions. Musculoskeletal: No significant bony findings. IMPRESSION: 1. Stable (over the past several CT scans) lobulated left upper lobe lung mass.  2. Stable radiation changes involving the right hilum and paramediastinal lung. No findings suspicious for recurrent tumor. 3. No mediastinal or hilar mass or adenopathy. 4. Stable 17 mm left axillary soft tissue lesion. 5. No findings for abdominal/pelvic metastatic disease. 6. Stable bilateral renal calculi. 7. Stable advanced atherosclerotic calcifications involving the thoracic and abdominal aorta and branch vessels including the coronary arteries. 8. Aortic atherosclerosis. Aortic Atherosclerosis (ICD10-I70.0). Electronically Signed   By: Marijo Sanes M.D.   On: 04/23/2021 09:53     ASSESSMENT AND PLAN: This is a very pleasant 75 years old white male with very light most smoking history recently diagnosed with stage IV (T3, N0, M1c) non-small cell lung cancer, squamous cell carcinoma based on the biopsy from the left axillary mass. He status post 4 cycles of induction systemic chemotherapy with carboplatin, paclitaxel and Keytruda with partial response.  The patient is currently on maintenance treatment with single agent Keytruda status post 41 cycles. The patient has been tolerating this treatment well but recently was admitted to the hospital with encephalitis of unclear etiology could be related to a tick bite but also immunotherapy mediated encephalitis could not be excluded at this point. He has been in observation for the last several months  and he is doing fine with no concerning issues except for the baseline shortness of breath and sleep apnea. He had repeat CT scan of the chest, abdomen pelvis performed recently.  I personally and independently reviewed the scans and discussed the results with the patient today. His scan showed no concerning findings for disease progression. I recommended for the patient to continue on observation with repeat CT scan of the chest, abdomen pelvis in 4 months. Regarding the sleeping studies, he is followed by neurology and referred to cardiology. For the  hypothyroidism, his TSH is very low, I will adjust his dose of levothyroxine to 150 mcg p.o. daily and we will monitor his TSH closely on the upcoming blood work. The patient was advised to call immediately if he has any other concerning symptoms in the interval.  The patient voices understanding of current disease status and treatment options and is in agreement with the current care plan. All questions were answered. The patient knows to call the clinic with any problems, questions or concerns. We can certainly see the patient much sooner if necessary.  Disclaimer: This note was dictated with voice recognition software. Similar sounding words can inadvertently be transcribed and may not be corrected upon review.

## 2021-04-27 ENCOUNTER — Telehealth: Payer: Self-pay | Admitting: Internal Medicine

## 2021-04-27 DIAGNOSIS — M542 Cervicalgia: Secondary | ICD-10-CM | POA: Diagnosis not present

## 2021-04-27 DIAGNOSIS — M79671 Pain in right foot: Secondary | ICD-10-CM | POA: Diagnosis not present

## 2021-04-27 DIAGNOSIS — M19071 Primary osteoarthritis, right ankle and foot: Secondary | ICD-10-CM | POA: Diagnosis not present

## 2021-04-27 DIAGNOSIS — M545 Low back pain, unspecified: Secondary | ICD-10-CM | POA: Diagnosis not present

## 2021-04-27 NOTE — Telephone Encounter (Signed)
Scheduled per los. Called and left msg. Mailed printout  °

## 2021-04-29 ENCOUNTER — Other Ambulatory Visit: Payer: Self-pay | Admitting: Physician Assistant

## 2021-04-29 DIAGNOSIS — C3491 Malignant neoplasm of unspecified part of right bronchus or lung: Secondary | ICD-10-CM

## 2021-05-01 ENCOUNTER — Other Ambulatory Visit: Payer: Self-pay | Admitting: Orthopedic Surgery

## 2021-05-01 ENCOUNTER — Other Ambulatory Visit: Payer: Self-pay

## 2021-05-01 ENCOUNTER — Other Ambulatory Visit: Payer: Self-pay | Admitting: Physician Assistant

## 2021-05-01 DIAGNOSIS — M259 Joint disorder, unspecified: Secondary | ICD-10-CM

## 2021-05-01 DIAGNOSIS — C3491 Malignant neoplasm of unspecified part of right bronchus or lung: Secondary | ICD-10-CM

## 2021-05-01 MED ORDER — LEVOTHYROXINE SODIUM 150 MCG PO TABS: 150.0000 ug | ORAL_TABLET | Freq: Every day | ORAL | 2 refills | Status: DC

## 2021-05-03 ENCOUNTER — Telehealth: Payer: Self-pay

## 2021-05-03 ENCOUNTER — Other Ambulatory Visit: Payer: Self-pay

## 2021-05-03 DIAGNOSIS — L578 Other skin changes due to chronic exposure to nonionizing radiation: Secondary | ICD-10-CM | POA: Diagnosis not present

## 2021-05-03 DIAGNOSIS — L570 Actinic keratosis: Secondary | ICD-10-CM | POA: Diagnosis not present

## 2021-05-03 DIAGNOSIS — L821 Other seborrheic keratosis: Secondary | ICD-10-CM | POA: Diagnosis not present

## 2021-05-03 MED ORDER — PREDNISONE 50 MG PO TABS: ORAL_TABLET | ORAL | 0 refills | Status: DC

## 2021-05-03 NOTE — Progress Notes (Signed)
Phone call to patient to review instructions for 13 hr prep for SI joint injection w/ contrast on 05/22/21 at 1:30 PM. Prescription called into [facility]. Pt aware and verbalized understanding of instructions. Prescription: Pt to take 50 mg of prednisone on 05/22/21 at 1230 am, 50 mg of prednisone on 05/22/21 at 6:30 am, and 50 mg of prednisone on 05/22/21 at 1230 pm. Pt is also to take 50 mg of benadryl on 05/22/21 at 1230 pm. Please call [phone] with any questions.    Benadryl was not called into the pharmacy as he reports he has benadryl at home and verbalized understanding of when to take this medication. Also advised the patient to have a driver the day of taking benadryl as it may cause drowsiness.

## 2021-05-09 ENCOUNTER — Telehealth: Payer: Self-pay | Admitting: *Deleted

## 2021-05-09 NOTE — Telephone Encounter (Signed)
Pt scheduled for f/u 07/24/21 at 9:30 am.

## 2021-05-09 NOTE — Telephone Encounter (Signed)
We received a fax from [employer].  Patient has been set up on an [facility] starting 05/08/21.  He will need an appointment per insurance requirement between 31-89 days after setup.

## 2021-05-11 DIAGNOSIS — Z8719 Personal history of other diseases of the digestive system: Secondary | ICD-10-CM

## 2021-05-11 HISTORY — DX: Personal history of other diseases of the digestive system: Z87.19

## 2021-05-16 DIAGNOSIS — Z23 Encounter for immunization: Secondary | ICD-10-CM | POA: Diagnosis not present

## 2021-05-22 ENCOUNTER — Ambulatory Visit
Admission: RE | Admit: 2021-05-22 | Discharge: 2021-05-22 | Disposition: A | Discharge status: Home / Self Care | Payer: Medicare Other | Source: Ambulatory Visit | Attending: Orthopedic Surgery | Admitting: Orthopedic Surgery

## 2021-05-22 ENCOUNTER — Encounter (HOSPITAL_COMMUNITY): Payer: Self-pay

## 2021-05-22 ENCOUNTER — Other Ambulatory Visit: Payer: Self-pay

## 2021-05-22 ENCOUNTER — Emergency Department (HOSPITAL_COMMUNITY)
Admission: EM | Admit: 2021-05-22 | Discharge: 2021-05-22 | Disposition: A | Discharge status: Home / Self Care | Payer: Medicare Other | Attending: Emergency Medicine | Admitting: Emergency Medicine

## 2021-05-22 ENCOUNTER — Emergency Department (HOSPITAL_COMMUNITY): Payer: Medicare Other

## 2021-05-22 DIAGNOSIS — M259 Joint disorder, unspecified: Secondary | ICD-10-CM

## 2021-05-22 DIAGNOSIS — R079 Chest pain, unspecified: Secondary | ICD-10-CM | POA: Diagnosis not present

## 2021-05-22 DIAGNOSIS — M461 Sacroiliitis, not elsewhere classified: Secondary | ICD-10-CM | POA: Diagnosis not present

## 2021-05-22 DIAGNOSIS — Z5321 Procedure and treatment not carried out due to patient leaving prior to being seen by health care provider: Secondary | ICD-10-CM | POA: Diagnosis not present

## 2021-05-22 DIAGNOSIS — R0789 Other chest pain: Secondary | ICD-10-CM | POA: Diagnosis not present

## 2021-05-22 DIAGNOSIS — R918 Other nonspecific abnormal finding of lung field: Secondary | ICD-10-CM | POA: Diagnosis not present

## 2021-05-22 LAB — CBC WITH DIFFERENTIAL/PLATELET
Abs Immature Granulocytes: 0.06 10*3/uL (ref 0.00–0.07)
Basophils Absolute: 0 10*3/uL (ref 0.0–0.1)
Basophils Relative: 0 %
Eosinophils Absolute: 0 10*3/uL (ref 0.0–0.5)
Eosinophils Relative: 0 %
HCT: 40.4 % (ref 39.0–52.0)
Hemoglobin: 13.9 g/dL (ref 13.0–17.0)
Immature Granulocytes: 1 %
Lymphocytes Relative: 11 %
Lymphs Abs: 1 10*3/uL (ref 0.7–4.0)
MCH: 31.1 pg (ref 26.0–34.0)
MCHC: 34.4 g/dL (ref 30.0–36.0)
MCV: 90.4 fL (ref 80.0–100.0)
Monocytes Absolute: 0.5 10*3/uL (ref 0.1–1.0)
Monocytes Relative: 6 %
Neutro Abs: 7.7 10*3/uL (ref 1.7–7.7)
Neutrophils Relative %: 82 %
Platelets: 204 10*3/uL (ref 150–400)
RBC: 4.47 MIL/uL (ref 4.22–5.81)
RDW: 12.3 % (ref 11.5–15.5)
WBC: 9.4 10*3/uL (ref 4.0–10.5)
nRBC: 0 % (ref 0.0–0.2)

## 2021-05-22 LAB — COMPREHENSIVE METABOLIC PANEL
ALT: 14 U/L (ref 0–44)
AST: 18 U/L (ref 15–41)
Albumin: 4.2 g/dL (ref 3.5–5.0)
Alkaline Phosphatase: 58 U/L (ref 38–126)
Anion gap: 9 (ref 5–15)
BUN: 27 mg/dL — ABNORMAL HIGH (ref 8–23)
CO2: 24 mmol/L (ref 22–32)
Calcium: 9.7 mg/dL (ref 8.9–10.3)
Chloride: 109 mmol/L (ref 98–111)
Creatinine, Ser: 1.28 mg/dL — ABNORMAL HIGH (ref 0.61–1.24)
GFR, Estimated: 59 mL/min — ABNORMAL LOW (ref >60)
Glucose, Bld: 242 mg/dL — ABNORMAL HIGH (ref 70–99)
Potassium: 4.9 mmol/L (ref 3.5–5.1)
Sodium: 142 mmol/L (ref 135–145)
Total Bilirubin: 0.5 mg/dL (ref 0.3–1.2)
Total Protein: 7.6 g/dL (ref 6.5–8.1)

## 2021-05-22 LAB — TROPONIN I (HIGH SENSITIVITY): Troponin I (High Sensitivity): 10 ng/L (ref <18)

## 2021-05-22 NOTE — ED Triage Notes (Signed)
Patient states that he has been trying to wear a CPAP. Patient states the longest he has worn the CPAP was 2 hours. Patient states that when he wears the CPAP he gets chest tightness and has at times feels like his throat is closing. Today, when the chest began hurting he states he did a nebulizer treatment, Xanax for anxiety, and took Aspirin.

## 2021-05-22 NOTE — ED Provider Notes (Signed)
Emergency Medicine Provider Triage Evaluation Note  Travis Padilla , a 75 y.o. male  was evaluated in triage.  Pt complains of presenting for evaluation of chest tightness and feeling through his closing.  Patient states he first felt this way while using CPAP.  However today he was not using CPAP when he felt that way again. Sxs are improved currently, but not completely resolved.  He has had some increased nausea this week, but no vomiting.  No fever, cough, abdominal pain, urinary symptoms, normal bowel movements.  He is Xanax, aspirin, and albuterol which improved but did not resolve his symptoms  Review of Systems  Positive: Chest tightness, throat discomfort Negative: fever  Physical Exam  BP (!) 136/96 (BP Location: Left Arm)   Pulse (!) 108   Temp 98.2 F (36.8 C) (Oral)   Resp 18   Ht 6\' 2"  (1.88 m)   Wt 100.7 kg   SpO2 100%   BMI 28.50 kg/m  Gen:   Awake, no distress   Resp:  Normal effort, clear lung sounds MSK:   Moves extremities without difficulty  Other:  No ttp of abd  Medical Decision Making  Medically screening exam initiated at 7:19 PM.  Appropriate orders placed.  [name] was informed that the remainder of the evaluation will be completed by another provider, this initial triage assessment does not replace that evaluation, and the importance of remaining in the ED until their evaluation is complete.  Labs, ekg, cxr   Franchot Heidelberg, PA-C 05/22/21 Deborah Chalk, MD 05/22/21 1944

## 2021-05-23 ENCOUNTER — Telehealth: Payer: Self-pay | Admitting: Family Medicine

## 2021-05-23 ENCOUNTER — Encounter (HOSPITAL_BASED_OUTPATIENT_CLINIC_OR_DEPARTMENT_OTHER): Payer: Self-pay | Admitting: Emergency Medicine

## 2021-05-23 ENCOUNTER — Encounter: Payer: Self-pay | Admitting: Neurology

## 2021-05-23 ENCOUNTER — Telehealth: Payer: Self-pay | Admitting: Cardiology

## 2021-05-23 ENCOUNTER — Emergency Department (HOSPITAL_BASED_OUTPATIENT_CLINIC_OR_DEPARTMENT_OTHER)
Admission: EM | Admit: 2021-05-23 | Discharge: 2021-05-23 | Disposition: A | Discharge status: Home / Self Care | Payer: Medicare Other | Source: Home / Self Care | Attending: Emergency Medicine | Admitting: Emergency Medicine

## 2021-05-23 ENCOUNTER — Other Ambulatory Visit: Payer: Self-pay

## 2021-05-23 DIAGNOSIS — Z85118 Personal history of other malignant neoplasm of bronchus and lung: Secondary | ICD-10-CM | POA: Insufficient documentation

## 2021-05-23 DIAGNOSIS — E039 Hypothyroidism, unspecified: Secondary | ICD-10-CM | POA: Insufficient documentation

## 2021-05-23 DIAGNOSIS — Z79899 Other long term (current) drug therapy: Secondary | ICD-10-CM | POA: Insufficient documentation

## 2021-05-23 DIAGNOSIS — I2510 Atherosclerotic heart disease of native coronary artery without angina pectoris: Secondary | ICD-10-CM | POA: Insufficient documentation

## 2021-05-23 DIAGNOSIS — Z87891 Personal history of nicotine dependence: Secondary | ICD-10-CM | POA: Insufficient documentation

## 2021-05-23 DIAGNOSIS — G4733 Obstructive sleep apnea (adult) (pediatric): Secondary | ICD-10-CM

## 2021-05-23 DIAGNOSIS — Z789 Other specified health status: Secondary | ICD-10-CM

## 2021-05-23 DIAGNOSIS — R0789 Other chest pain: Secondary | ICD-10-CM | POA: Insufficient documentation

## 2021-05-23 DIAGNOSIS — D62 Acute posthemorrhagic anemia: Secondary | ICD-10-CM | POA: Diagnosis not present

## 2021-05-23 DIAGNOSIS — I472 Ventricular tachycardia: Secondary | ICD-10-CM | POA: Diagnosis not present

## 2021-05-23 DIAGNOSIS — I10 Essential (primary) hypertension: Secondary | ICD-10-CM | POA: Insufficient documentation

## 2021-05-23 DIAGNOSIS — I25110 Atherosclerotic heart disease of native coronary artery with unstable angina pectoris: Secondary | ICD-10-CM | POA: Diagnosis not present

## 2021-05-23 DIAGNOSIS — D696 Thrombocytopenia, unspecified: Secondary | ICD-10-CM | POA: Diagnosis not present

## 2021-05-23 DIAGNOSIS — Z20822 Contact with and (suspected) exposure to covid-19: Secondary | ICD-10-CM | POA: Diagnosis not present

## 2021-05-23 DIAGNOSIS — K2901 Acute gastritis with bleeding: Secondary | ICD-10-CM | POA: Diagnosis not present

## 2021-05-23 LAB — TROPONIN I (HIGH SENSITIVITY): Troponin I (High Sensitivity): 16 ng/L (ref <18)

## 2021-05-23 LAB — D-DIMER, QUANTITATIVE: D-Dimer, Quant: 0.38 ug/mL-FEU (ref 0.00–0.50)

## 2021-05-23 NOTE — Telephone Encounter (Signed)
Please call pt to schedule ER Follow Up. Was seen in ER for chest pain.

## 2021-05-23 NOTE — Telephone Encounter (Signed)
Dr. Cathleen Corti, ER Resident at [facility] called.   The patient went to the [place] ED to be treated. He reported Chest tightness and chest pain that occurred whether he was using his cpap or not.  The Ed did a troponin today and compared it to yesterday when he tried to be seen at [facility]   The ER Resident wanted to talk to Dr. [name] or the DOD to discuss what to do next for this patient

## 2021-05-23 NOTE — ED Provider Notes (Signed)
[facility] EMERGENCY DEPT Provider Note   CSN: [id number] Arrival date & time: 05/23/21  1025    History Chief Complaint  Patient presents with   Chest Pain    Travis Padilla is a 75 y.o. male with a past medical history of stage IV squamous cell carcinoma, OSA, HLD, HTN, anemia, and gout who presents here today with complaints of chest tightness.  The patient states that he started CPAP therapy in the middle of last August.  He has been unable to wear his CPAP for all hours of the night due to his mask fitting poorly.  He had a couple of minor episodes of chest tightness while using his CPAP about 2 weeks ago, however these resolved on their own.  His main concern was recent episode of chest tightness on Saturday. He states that he finally got a well-fitting mask then, however he started having chest tightness within an hour of being on CPAP, and he states that his throat also felt full at that time.  He noticed his O2 sat went from 97% to 94% during the episode.  He stopped using CPAP and took aspirin and Xanax, and the chest tightness went away.  However, the chest tightness returned yesterday while he was not using CPAP. He states that, during this last episode, he took aspirin and Xanax again and also used an albuterol inhaler which improved the chest tightness somewhat.  He presented to [facility] for further evaluation, however he left before being seen due to long wait times.  Denies fevers, chills.  He reports driving to [place] and back on Friday, he denies redness and swelling of the BLE.  He has been diagnosed with stage IV squamous cell carcinoma and is not currently on therapy, however review of records shows that he was on immunotherapy last April, but this was discontinued due to intolerance.  Patient has a follow-up appointment with cardiology tomorrow, 05/24/2021.  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/19/2018   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) ([place]) dx'd 04/17/18   to LN, infrahilar mass, lung nodule and lt axilla   Obesity, unspecified 03/28/2009   Qualifier: Diagnosis of  By: [name], MD, [name]     Palpitations 03/28/2009   Qualifier: Diagnosis of  By: [name], MD, [name]     Right lower lobe lung mass 04/22/2018   Skin cancer    SNORING 03/28/2009   Qualifier: Diagnosis of  By: [name], MD, [name]     Spinal stenosis    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[place]) 05/13/2018    Patient Active Problem List   Diagnosis Date Noted   Coronary artery disease involving native coronary artery of native heart without angina pectoris 12/07/2020   SIRS (systemic inflammatory response syndrome) ([place]) 11/27/2020   Encephalitis    Thrombocytopenia (HCC)    Severe sepsis without septic shock ([place]) 11/26/2020  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   Itching 11/17/2019   Dyslipidemia 07/14/2019   Shoulder pain 03/31/2019   Port-A-Cath in place 08/11/2018   Hypothyroidism (acquired) 08/11/2018   Metastasis to lung ([place]) 05/19/2018   Encounter for antineoplastic chemotherapy 05/13/2018   Encounter for antineoplastic immunotherapy 05/13/2018   Goals of care, counseling/discussion 05/13/2018   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[place]) 05/13/2018   Axillary adenopathy 04/24/2018   Right lower lobe lung mass 04/22/2018   Cough with hemoptysis 04/22/2018   Wheezing 04/02/2018    Long-term use of high-risk medication 04/02/2018   Dizziness 06/26/2017   Fatigue 04/30/2016   Insomnia 04/30/2016   Sinusitis 01/31/2016   Hyperlipidemia 01/31/2016   Ear pain 05/09/2015   Nephrolithiasis 04/18/2015   Anemia    Abnormal nuclear stress test    OBESITY, UNSPECIFIED 03/28/2009   Essential hypertension, benign 03/28/2009   PALPITATIONS 03/28/2009   SNORING 03/28/2009    Past Surgical History:  Procedure Laterality Date   basal skin cancer N/A 2019   Nose   BELPHAROPTOSIS REPAIR Bilateral    CATARACT EXTRACTION W/ INTRAOCULAR LENS  IMPLANT, BILATERAL     COLONOSCOPY N/A 11/03/2014   Procedure: COLONOSCOPY;  Surgeon: Rogene Houston, MD;  Location: [facility];  Service: Endoscopy;  Laterality: N/A;  1225   IR CV LINE INJECTION  08/24/2020   IR FLUORO GUIDED NEEDLE PLC ASPIRATION/INJECTION LOC  11/28/2020   IR IMAGING GUIDED PORT INSERTION  07/11/2018   KNEE CARTILAGE SURGERY Left    Left knee   SKIN CANCER EXCISION  12/2014, 04/25/15   VIDEO BRONCHOSCOPY Bilateral 05/09/2018   Procedure: VIDEO BRONCHOSCOPY WITH FLUORO;  Surgeon: Juanito Doom, MD;  Location: [facility] ENDOSCOPY;  Service: Cardiopulmonary;  Laterality: Bilateral;       Family History  Problem Relation Age of Onset   Heart attack Father    Heart failure Mother    Parkinson's disease Mother    Healthy Sister    Skin cancer Sister    Neuropathy Brother    COPD Brother    Epilepsy Brother    Healthy Sister    Healthy Sister    Colon cancer Neg Hx     Social History   Tobacco Use   Smoking status: Former    Packs/day: 0.20    Years: 2.00    Pack years: 0.40    Types: Cigarettes    Start date: 10/01/1968    Quit date: 1972    Years since quitting: 50.7   Smokeless tobacco: Never  Vaping Use   Vaping Use: Never used  Substance Use Topics   Alcohol use: No    Comment: hx of    Drug use: No    Home Medications Prior to Admission medications   Medication Sig Start Date End Date  Taking? Authorizing Provider  acetaminophen (TYLENOL) 500 MG tablet Take 1,000 mg by mouth every 6 (six) hours as needed.   Yes [provider]  albuterol (VENTOLIN HFA) 108 (90 Base) MCG/ACT inhaler Inhale 2 puffs into the lungs every 6 (six) hours as needed for wheezing or shortness of breath. 04/04/21  Yes Icard, Octavio Graves, DO  ALPRAZolam (XANAX) 0.25 MG tablet Take 1 tablet (0.25 mg total) by mouth at bedtime as needed for anxiety. 10/19/20  Yes Curt Bears, MD  Artificial Tear Solution (SOOTHE XP OP) Place 1 drop into both eyes 2 (two) times daily.   Yes [provider]  carvedilol (COREG) 12.5 MG tablet Take 1 tablet (12.5 mg total) by mouth in the morning, at noon, and at bedtime. 12/21/20  Yes [name], MD  diphenhydrAMINE (BENADRYL) 50 MG tablet Take 1 tablet (50 mg total) by mouth as directed. Take 1 tablet one hour prior to Ct scans 01/19/21  Yes Curt Bears, MD  fluticasone [facility]) 50 MCG/ACT nasal spray Place 1 spray into both nostrils daily. 07/01/20  Yes Lauraine Rinne, NP  Fluticasone-Umeclidin-Vilant (TRELEGY ELLIPTA) 100-62.5-25 MCG/INH AEPB Inhale 1 puff into the lungs daily. 03/20/21  Yes Icard, Octavio Graves, DO  levothyroxine (SYNTHROID) 150 MCG tablet Take 1 tablet (150 mcg total) by mouth daily before breakfast. 05/01/21  Yes Heilingoetter, Cassandra L, PA-C  methocarbamol (ROBAXIN) 500 MG tablet Take 500 mg by mouth daily. 08/11/19  Yes [provider]  polyethylene glycol powder (GLYCOLAX/MIRALAX) 17 GM/SCOOP powder Take 1 Container by mouth once.   Yes [provider]  PRALUENT 150 MG/ML SOAJ INJECT 150 MG INTO THE SKIN EVERY 14 (FOURTEEN) DAYS. 04/24/21  Yes [name], MD  predniSONE (DELTASONE) 50 MG tablet Pt to take 50 mg of prednisone on 05/22/21 at 1230 am, 50 mg of prednisone on 05/22/21 at 6:30 am, and 50 mg of prednisone on 05/22/21 at 1230 pm. Pt is also to take 50 mg of benadryl on 05/22/21 at 1230 pm. Please call  [phone] with any questions. 05/03/21  Yes San Morelle, MD  senna-docusate (SENNA S) 8.6-50 MG tablet 1 to 2 twice daily for constipation 07/07/18  Yes Tanner, Lyndon Code., PA-C  tamsulosin (FLOMAX) 0.4 MG CAPS capsule Take 0.4 mg by mouth at bedtime. 09/29/20  Yes [provider]  pantoprazole (PROTONIX) 40 MG tablet TAKE 1 TABLET (40 MG TOTAL) BY MOUTH DAILY FOR 2 WEEKS PER FLAREUP OF REFLUX Patient not taking: No sig reported 01/30/21   Ronnie Doss M, DO  predniSONE (DELTASONE) 50 MG tablet Take 1 tablet (50 mg total) by mouth as directed. Take one tablet 13 hours , one tablet 7 hours and one tablet  1 hour prior to scan 01/19/21   Curt Bears, MD  Rimegepant Sulfate (NURTEC) 75 MG TBDP Dissolve 1 tablet in mouth at onset of migraine ONCE daily if needed. Patient not taking: No sig reported 01/17/21   [name], DO    Allergies    Pravastatin and Iodine  Review of Systems   Review of Systems  Constitutional:  Negative for chills and fever.  HENT: Negative.    Eyes: Negative.   Respiratory:  Positive for chest tightness and shortness of breath.   Cardiovascular:  Positive for chest pain. Negative for leg swelling.  Gastrointestinal: Negative.   Endocrine: Negative.   Genitourinary: Negative.   Musculoskeletal: Negative.   Skin: Negative.   Allergic/Immunologic: Negative.   Neurological: Negative.   Hematological: Negative.   Psychiatric/Behavioral: Negative.     Physical Exam Updated Vital Signs BP (!) 144/79 (BP Location: Right Arm)   Pulse 63   Temp 97.8 F (36.6 C) (Oral)   Resp 16   Ht 6\' 2"  (1.88 m)   Wt 100.7 kg   SpO2 100%   BMI 28.50 kg/m   Physical Exam Constitutional:      General: He is not in acute distress. HENT:     Head: Normocephalic and atraumatic.  Eyes:     Extraocular Movements: Extraocular movements intact.     Pupils: Pupils are equal, round, and reactive to light.  Cardiovascular:  Rate and Rhythm: Normal  rate and regular rhythm.     Heart sounds: Normal heart sounds. No murmur heard.   No friction rub. No gallop.  Pulmonary:     Effort: Pulmonary effort is normal.     Breath sounds: Normal breath sounds. No wheezing, rhonchi or rales.  Abdominal:     Palpations: Abdomen is soft.     Tenderness: There is no abdominal tenderness.  Musculoskeletal:        General: No swelling, deformity or signs of injury.     Right lower leg: No edema.     Left lower leg: No edema.  Skin:    General: Skin is warm and dry.  Neurological:     General: No focal deficit present.     Mental Status: He is alert and oriented to person, place, and time. Mental status is at baseline.  Psychiatric:        Mood and Affect: Mood normal.        Behavior: Behavior normal.    ED Results / Procedures / Treatments   Labs (all labs ordered are listed, but only abnormal results are displayed) Labs Reviewed  D-DIMER, QUANTITATIVE  TROPONIN I (HIGH SENSITIVITY)    EKG None  Radiology DG Chest 2 View  Result Date: 05/22/2021 CLINICAL DATA:  Chest pain EXAM: CHEST - 2 VIEW COMPARISON:  CT chest 04/21/2021, chest radiograph 11/26/2020 FINDINGS: A right chest wall port is in place terminating in the region of the superior cavoatrial junction. The cardiomediastinal silhouette is grossly stable. Patchy and linear opacities in the right hilar region with associated architectural distortion are similar to the prior study. The known left upper lobe mass is not well seen on the current study. There is no new focal airspace disease. There is no pleural effusion. There is no pneumothorax. There is no acute osseous abnormality. IMPRESSION: 1. No radiographic evidence of acute cardiopulmonary process. 2. Stable architectural distortion in the right hilar region likely reflecting postradiation change. 3. The known left upper lobe mass is not well seen on the current study. Electronically Signed   By: Valetta Mole M.D.   On:  05/22/2021 19:48   CT BIOPSY  Result Date: 05/22/2021 CLINICAL DATA:  Joint disorder. Left sacroiliitis. Minimal response to physical therapy. Patient has a history of contrast reaction and used a 13 hour prep. EXAM: Left CT GUIDED SI JOINT INJECTION PROCEDURE: After a thorough discussion of risks and benefits of the procedure, including bleeding, infection, injury to nerves, blood vessels, and adjacent structures, verbal and written consent was obtained. Specific risks of the procedure included nondiagnostic/nontherapeutic injection and non target injection. The patient was placed prone on the CT table and localization was performed over the sacrum. Target site marked using CT guidance. The skin was prepped and draped in the usual sterile fashion using Betadine soap. After local anesthesia with 1% lidocaine without epinephrine and subsequent deep anesthesia, a 22 gauge spinal needle was advanced into the left SI joint under intermittent CT guidance. Once the needle was in satisfactory position, representative image following injection of 1 mL Isovue M 200 was captured with the needle demonstrated in the sacroiliac joint. Confirmed position subsequently, 2 mL 0.5% bupivacaine was injected into the left SI joint. Needles removed and a sterile dressing applied. Patient reported cord pain during the injection. He has fairly low-level pain prior to the injection, potentially secondary to the steroids. There was no significant change to his pain after the injection. We discussed logging  his pain over the next 24-36 hours. No complications were observed. IMPRESSION: Successful CT-guided left anesthetic only SI joint injection. Electronically Signed   By: San Morelle M.D.   On: 05/22/2021 14:59    Procedures Procedures   Medications Ordered in ED Medications - No data to display  ED Course  I have reviewed the triage vital signs and the nursing notes.  Pertinent labs & imaging results that were  available during my care of the patient were reviewed by me and considered in my medical decision making (see chart for details).    MDM Rules/Calculators/A&P                         This is a 75 year old male with past medical history of metastatic squamous cell carcinoma, who is not currently on therapy but has been within the past 6 months.  Patient endorses several episodes of chest tightness, both occurring with and without CPAP use.  Patient was to be seen at [facility] ED yesterday, however work-up was not completed due to patient leaving because of long wait times in the ED.  Patient is not tachycardic at this time, and SPO2 is 100% on room air.  He is also chest pain-free at this time. Troponin x1 yesterday was WNL, troponin #2 not obtained at that time as pt left the ED.  EKG shows sinus rhythm, overall unchanged from EKG last March.  1100: Given history of malignancy, tx within the past 6 months, long car ride last Friday, PE higher up on differential. Obtained D-dimer. Second troponin was also obtained for ACS rule out.  1130: D-dimer negative, troponin #2 was 16.   1330: Discussed case over the phone with patient's cardiologist, Dr. [name].  Given negative lab work and unchanged EKG here, and given recent negative stress test, patient is low risk for ACS.  PE also unlikely with negative work-up here.  Patient was discharged in stable condition and advised to follow-up with cardiology for appointment tomorrow and to follow-up with PCP as soon as able.   Final Clinical Impression(s) / ED Diagnoses Final diagnoses:  Chest tightness    Rx / DC Orders ED Discharge Orders     None        Orvis Brill, MD 05/23/21 1400    Blanchie Dessert, MD 05/30/21 1350

## 2021-05-23 NOTE — ED Triage Notes (Signed)
Pt arrives from  home c/o chest pain. Pt checked  into [facility] yesterday for same and left after he started that he felt better. His PCP advised him to return to finished out the first ED visit. Pt states that he feels much better than yesterday and only c/o of what he describes as  very light chest pain.

## 2021-05-23 NOTE — Telephone Encounter (Signed)
Dr [name] spoke with ED physician

## 2021-05-23 NOTE — Progress Notes (Signed)
Cardiology Office Note   Date:  05/24/2021   ID:  Travis Padilla, Travis Padilla August 29, 1946, MRN [id number]  PCP:  [name], DO  Cardiologist:   None   Chief Complaint  Patient presents with   Chest Pain       History of Present Illness: Travis Padilla is a 75 y.o. male who presents for follow up of palpitations.  He has been treated for small cell lung cancer.   He was treated with carrboplatin, paclitaxel  and Keytruda.  He has had radiation.   I reviewed the oncology notes and CT report for this appointment.  CT demonstrated aortic atherosclerosis as well as coronary artery calcification. POET (Plain Old Exercise Treadmill) demonstrated no high risk findings. He was in the ED with chest pain yesterday.  I reviewed these records for this visit.      He said that he started using CPAP about 5 days ago.  After he put it on he had an episode of shortness of breath and chest and neck tightness.  He following he was suffocating. He went to the emergency room at [facility] long on Monday after having another episode of discomfort after getting back injection.  However, he did not wait to be seen.  Enzymes were negative.  Yesterday he was encouraged to go back to the ER he actually went to [facility].  I do see that he had a right bundle branch block which is new compared to previous.  D-dimer was unremarkable.  Cardiac enzymes were again unremarkable.  He was discharged from the emergency room.  Again he describes the discomfort as a dull tightness in his chest and radiating up to his neck.  It happens at rest.  He is not having any resting shortness of breath, PND or orthopnea.  He does not notice any palpitations and has had no presyncope or syncope.   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) ([place]) dx'd 04/17/18   to LN, infrahilar mass, lung nodule and lt axilla   Obesity, unspecified 03/28/2009   Qualifier: Diagnosis of  By: [name], MD, [name]     Palpitations 03/28/2009   Qualifier: Diagnosis of  By: [name], MD, [name]     Right lower lobe lung mass 04/22/2018   Skin cancer    SNORING 03/28/2009   Qualifier: Diagnosis of  By: [name], MD, [name]     Spinal stenosis    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    Past Surgical History:  Procedure Laterality Date   basal skin cancer N/A 2019   Nose   BELPHAROPTOSIS REPAIR Bilateral    CATARACT EXTRACTION W/ INTRAOCULAR LENS  IMPLANT, BILATERAL     COLONOSCOPY N/A 11/03/2014   Procedure: COLONOSCOPY;  Surgeon: Rogene Houston, MD;  [facility];  Service: Endoscopy;  Laterality: N/A;  1225  IR CV LINE INJECTION  08/24/2020   IR FLUORO GUIDED NEEDLE PLC ASPIRATION/INJECTION LOC  11/28/2020   IR IMAGING GUIDED PORT INSERTION  07/11/2018   KNEE CARTILAGE SURGERY Left    Left knee   SKIN CANCER EXCISION  12/2014, 04/25/15   VIDEO BRONCHOSCOPY Bilateral 05/09/2018   Procedure: VIDEO BRONCHOSCOPY WITH FLUORO;  Surgeon: Juanito Doom, MD;  Location: [facility];  Service: Cardiopulmonary;  Laterality: Bilateral;     Current Outpatient Medications  Medication Sig Dispense Refill   acetaminophen (TYLENOL) 500 MG tablet Take 1,000 mg by mouth every 6 (six) hours as needed.     albuterol (VENTOLIN HFA) 108 (90 Base) MCG/ACT inhaler Inhale 2 puffs into the lungs every 6 (six) hours as needed for wheezing or shortness of breath. 8 g 6   ALPRAZolam (XANAX) 0.25 MG  tablet Take 1 tablet (0.25 mg total) by mouth at bedtime as needed for anxiety. 30 tablet 0   Artificial Tear Solution (SOOTHE XP OP) Place 1 drop into both eyes 2 (two) times daily.     carvedilol (COREG) 12.5 MG tablet Take 1 tablet (12.5 mg total) by mouth in the morning, at noon, and at bedtime. 270 tablet 3   diphenhydrAMINE (BENADRYL) 50 MG tablet Take 1 tablet (50 mg total) by mouth as directed. Take 1 tablet one hour prior to Ct scans 30 tablet 0   fluticasone (FLONASE) 50 MCG/ACT nasal spray Place 1 spray into both nostrils daily. 16 g 3   Fluticasone-Umeclidin-Vilant (TRELEGY ELLIPTA) 100-62.5-25 MCG/INH AEPB Inhale 1 puff into the lungs daily. 60 each 6   levothyroxine (SYNTHROID) 150 MCG tablet Take 1 tablet (150 mcg total) by mouth daily before breakfast. 30 tablet 2   methocarbamol (ROBAXIN) 500 MG tablet Take 500 mg by mouth daily.     pantoprazole (PROTONIX) 40 MG tablet TAKE 1 TABLET (40 MG TOTAL) BY MOUTH DAILY FOR 2 WEEKS PER FLAREUP OF REFLUX 90 tablet 0   polyethylene glycol powder (GLYCOLAX/MIRALAX) 17 GM/SCOOP powder Take 1 Container by mouth once.     PRALUENT 150 MG/ML SOAJ INJECT 150 MG INTO THE SKIN EVERY 14 (FOURTEEN) DAYS. 6 mL 3   predniSONE (DELTASONE) 50 MG tablet Take 1 tablet (50 mg total) by mouth as directed. Take one tablet 13 hours , one tablet 7 hours and one tablet  1 hour prior to scan 3 tablet 3   predniSONE (DELTASONE) 50 MG tablet Pt to take 50 mg of prednisone on 05/22/21 at 1230 am, 50 mg of prednisone on 05/22/21 at 6:30 am, and 50 mg of prednisone on 05/22/21 at 1230 pm. Pt is also to take 50 mg of benadryl on 05/22/21 at 1230 pm. Please call [phone] with any questions. 3 tablet 0   predniSONE (DELTASONE) 50 MG tablet Take 1 tablet 13 hours before CATH, take 1 tablet 7 hours before CATH, and 1 tablet before leaving home. 3 tablet 0   Rimegepant Sulfate (NURTEC) 75 MG TBDP Dissolve 1 tablet in mouth at onset of migraine ONCE daily if needed. 2 tablet 0    senna-docusate (SENNA S) 8.6-50 MG tablet 1 to 2 twice daily for constipation 120 tablet 5   tamsulosin (FLOMAX) 0.4 MG CAPS capsule Take 0.4 mg by mouth at bedtime.     No current facility-administered medications for this visit.    Allergies:   Pravastatin and Iodine    ROS:  Please see the history of present illness.   Otherwise, review of systems are positive for  none.   All other systems are reviewed and negative.    PHYSICAL EXAM: VS:  BP 136/68   Pulse (!) 58   Ht 6\' 2"  (1.88 m)   Wt 235 lb 3.2 oz (106.7 kg)   SpO2 96%   BMI 30.20 kg/m  , BMI Body mass index is 30.2 kg/m. GENERAL:  Well appearing NECK:  No jugular venous distention, waveform within normal limits, carotid upstroke brisk and symmetric, no bruits, no thyromegaly LUNGS:  Clear to auscultation bilaterally CHEST:  Unremarkable HEART:  PMI not displaced or sustained,S1 and S2 within normal limits, no S3, no S4, no clicks, no rubs, no murmurs ABD:  Flat, positive bowel sounds normal in frequency in pitch, no bruits, no rebound, no guarding, no midline pulsatile mass, no hepatomegaly, no splenomegaly EXT:  2 plus pulses throughout, no edema, no cyanosis no clubbing   EKG:  EKG is  ordered today. The ekg ordered today demonstrates sinus rhythm, rate 58, leftward axis,, left anterior fascicular block, right bundle branch block .  Right bundle branch block is new compared to previous  Recent Labs: 11/28/2020: B Natriuretic Peptide 479.9 12/02/2020: Magnesium 1.8 04/21/2021: TSH <0.080 05/22/2021: ALT 14; BUN 27; Creatinine, Ser 1.28; Hemoglobin 13.9; Platelets 204; Potassium 4.9; Sodium 142    Lipid Panel    Component Value Date/Time   CHOL 96 (L) 02/13/2021 0913   TRIG 119 02/13/2021 0913   HDL 36 (L) 02/13/2021 0913   CHOLHDL 2.7 02/13/2021 0913   LDLCALC 38 02/13/2021 0913      Wt Readings from Last 3 Encounters:  05/24/21 235 lb 3.2 oz (106.7 kg)  05/23/21 222 lb (100.7 kg)  05/22/21 222 lb (100.7 kg)       Other studies Reviewed: Additional studies/ records that were reviewed today include: ED records Review of the above records demonstrates:  Please see elsewhere in the note.     ASSESSMENT AND PLAN:   CAD -  Chest pain: His chest pain is worrisome despite the negative stress test recently.  He did have nonobstructive disease years ago.   He is not currently getting therapy for his cancer and so without treatment I am not concerned about myocarditis.  I think the pretest probability of obstructive coronary disease with his suspicious story was high.  Cardiac catheter is indicated.  The patient understands that risks included but are not limited to stroke (1 in 1000), death (1 in 83), kidney failure [usually temporary] (1 in 500), bleeding (1 in 200), allergic reaction [possibly serious] (1 in 200).  The patient understands and agrees to proceed.   He needs contrast allergy prophylaxis  ESSENTIAL HYPERTENSION, BENIGN -  His blood pressure is controlled.  No change in therapy.   RBBB - This is new.  He is having no symptoms related to this.  I do not have a strong suspicion for pulmonary embolism and D-dimer was negative.   PALPITATIONS -   No further work up.    DYSLIPIDEMIA -  I would like him to have fasting lipid profile   Current medicines are reviewed at length with the patient today.  The patient does not have concerns regarding medicines.  The following changes have been made:  None  Labs/ tests ordered today include:     Orders Placed This Encounter  Procedures   CBC   Basic metabolic panel   Lipid panel   EKG 12-Lead      Disposition:   FU with with me after the  cath.    Signed, [name], MD  05/24/2021 10:53 AM    [place]

## 2021-05-23 NOTE — H&P (View-Only) (Signed)
Cardiology Office Note   Date:  05/24/2021   ID:  Travis Padilla 12-Jun-1946, MRN [id number]  PCP:  Travis Norlander, DO  Cardiologist:   None   Chief Complaint  Patient presents with   Chest Pain       History of Present Illness: Travis Padilla is a 75 y.o. male who presents for follow up of palpitations.  He has been treated for small cell lung cancer.   He was treated with carrboplatin, paclitaxel  and Keytruda.  He has had radiation.   I reviewed the oncology notes and CT report for this appointment.  CT demonstrated aortic atherosclerosis as well as coronary artery calcification. POET (Plain Old Exercise Treadmill) demonstrated no high risk findings. He was in the ED with chest pain yesterday.  I reviewed these records for this visit.      He said that he started using CPAP about 5 days ago.  After he put it on he had an episode of shortness of breath and chest and neck tightness.  He following he was suffocating. He went to the emergency room at [facility] long on Monday after having another episode of discomfort after getting back injection.  However, he did not wait to be seen.  Enzymes were negative.  Yesterday he was encouraged to go back to the ER he actually went to [facility].  I do see that he had a right bundle branch block which is new compared to previous.  D-dimer was unremarkable.  Cardiac enzymes were again unremarkable.  He was discharged from the emergency room.  Again he describes the discomfort as a dull tightness in his chest and radiating up to his neck.  It happens at rest.  He is not having any resting shortness of breath, PND or orthopnea.  He does not notice any palpitations and has had no presyncope or syncope.   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    Past Surgical History:  Procedure Laterality Date   basal skin cancer N/A 2019   Nose   BELPHAROPTOSIS REPAIR Bilateral    CATARACT EXTRACTION W/ INTRAOCULAR LENS  IMPLANT, BILATERAL     COLONOSCOPY N/A 11/03/2014   Procedure: COLONOSCOPY;  Surgeon: Rogene Houston, MD;  [facility];  Service: Endoscopy;  Laterality: N/A;  1225  IR CV LINE INJECTION  08/24/2020   IR FLUORO GUIDED NEEDLE PLC ASPIRATION/INJECTION LOC  11/28/2020   IR IMAGING GUIDED PORT INSERTION  07/11/2018   KNEE CARTILAGE SURGERY Left    Left knee   SKIN CANCER EXCISION  12/2014, 04/25/15   VIDEO BRONCHOSCOPY Bilateral 05/09/2018   Procedure: VIDEO BRONCHOSCOPY WITH FLUORO;  Surgeon: Juanito Doom, MD;  Location: [facility];  Service: Cardiopulmonary;  Laterality: Bilateral;     Current Outpatient Medications  Medication Sig Dispense Refill   acetaminophen (TYLENOL) 500 MG tablet Take 1,000 mg by mouth every 6 (six) hours as needed.     albuterol (VENTOLIN HFA) 108 (90 Base) MCG/ACT inhaler Inhale 2 puffs into the lungs every 6 (six) hours as needed for wheezing or shortness of breath. 8 g 6   ALPRAZolam (XANAX) 0.25 MG  tablet Take 1 tablet (0.25 mg total) by mouth at bedtime as needed for anxiety. 30 tablet 0   Artificial Tear Solution (SOOTHE XP OP) Place 1 drop into both eyes 2 (two) times daily.     carvedilol (COREG) 12.5 MG tablet Take 1 tablet (12.5 mg total) by mouth in the morning, at noon, and at bedtime. 270 tablet 3   diphenhydrAMINE (BENADRYL) 50 MG tablet Take 1 tablet (50 mg total) by mouth as directed. Take 1 tablet one hour prior to Ct scans 30 tablet 0   fluticasone (FLONASE) 50 MCG/ACT nasal spray Place 1 spray into both nostrils daily. 16 g 3   Fluticasone-Umeclidin-Vilant (TRELEGY ELLIPTA) 100-62.5-25 MCG/INH AEPB Inhale 1 puff into the lungs daily. 60 each 6   levothyroxine (SYNTHROID) 150 MCG tablet Take 1 tablet (150 mcg total) by mouth daily before breakfast. 30 tablet 2   methocarbamol (ROBAXIN) 500 MG tablet Take 500 mg by mouth daily.     pantoprazole (PROTONIX) 40 MG tablet TAKE 1 TABLET (40 MG TOTAL) BY MOUTH DAILY FOR 2 WEEKS PER FLAREUP OF REFLUX 90 tablet 0   polyethylene glycol powder (GLYCOLAX/MIRALAX) 17 GM/SCOOP powder Take 1 Container by mouth once.     PRALUENT 150 MG/ML SOAJ INJECT 150 MG INTO THE SKIN EVERY 14 (FOURTEEN) DAYS. 6 mL 3   predniSONE (DELTASONE) 50 MG tablet Take 1 tablet (50 mg total) by mouth as directed. Take one tablet 13 hours , one tablet 7 hours and one tablet  1 hour prior to scan 3 tablet 3   predniSONE (DELTASONE) 50 MG tablet Pt to take 50 mg of prednisone on 05/22/21 at 1230 am, 50 mg of prednisone on 05/22/21 at 6:30 am, and 50 mg of prednisone on 05/22/21 at 1230 pm. Pt is also to take 50 mg of benadryl on 05/22/21 at 1230 pm. Please call [phone] with any questions. 3 tablet 0   predniSONE (DELTASONE) 50 MG tablet Take 1 tablet 13 hours before CATH, take 1 tablet 7 hours before CATH, and 1 tablet before leaving home. 3 tablet 0   Rimegepant Sulfate (NURTEC) 75 MG TBDP Dissolve 1 tablet in mouth at onset of migraine ONCE daily if needed. 2 tablet 0    senna-docusate (SENNA S) 8.6-50 MG tablet 1 to 2 twice daily for constipation 120 tablet 5   tamsulosin (FLOMAX) 0.4 MG CAPS capsule Take 0.4 mg by mouth at bedtime.     No current facility-administered medications for this visit.    Allergies:   Pravastatin and Iodine    ROS:  Please see the history of present illness.   Otherwise, review of systems are positive for  none.   All other systems are reviewed and negative.    PHYSICAL EXAM: VS:  BP 136/68   Pulse (!) 58   Ht 6\' 2"  (1.88 m)   Wt 235 lb 3.2 oz (106.7 kg)   SpO2 96%   BMI 30.20 kg/m  , BMI Body mass index is 30.2 kg/m. GENERAL:  Well appearing NECK:  No jugular venous distention, waveform within normal limits, carotid upstroke brisk and symmetric, no bruits, no thyromegaly LUNGS:  Clear to auscultation bilaterally CHEST:  Unremarkable HEART:  PMI not displaced or sustained,S1 and S2 within normal limits, no S3, no S4, no clicks, no rubs, no murmurs ABD:  Flat, positive bowel sounds normal in frequency in pitch, no bruits, no rebound, no guarding, no midline pulsatile mass, no hepatomegaly, no splenomegaly EXT:  2 plus pulses throughout, no edema, no cyanosis no clubbing   EKG:  EKG is  ordered today. The ekg ordered today demonstrates sinus rhythm, rate 58, leftward axis,, left anterior fascicular block, right bundle branch block .  Right bundle branch block is new compared to previous  Recent Labs: 11/28/2020: B Natriuretic Peptide 479.9 12/02/2020: Magnesium 1.8 04/21/2021: TSH <0.080 05/22/2021: ALT 14; BUN 27; Creatinine, Ser 1.28; Hemoglobin 13.9; Platelets 204; Potassium 4.9; Sodium 142    Lipid Panel    Component Value Date/Time   CHOL 96 (L) 02/13/2021 0913   TRIG 119 02/13/2021 0913   HDL 36 (L) 02/13/2021 0913   CHOLHDL 2.7 02/13/2021 0913   LDLCALC 38 02/13/2021 0913      Wt Readings from Last 3 Encounters:  05/24/21 235 lb 3.2 oz (106.7 kg)  05/23/21 222 lb (100.7 kg)  05/22/21 222 lb (100.7 kg)       Other studies Reviewed: Additional studies/ records that were reviewed today include: ED records Review of the above records demonstrates:  Please see elsewhere in the note.     ASSESSMENT AND PLAN:   CAD -  Chest pain: His chest pain is worrisome despite the negative stress test recently.  He did have nonobstructive disease years ago.   He is not currently getting therapy for his cancer and so without treatment I am not concerned about myocarditis.  I think the pretest probability of obstructive coronary disease with his suspicious story was high.  Cardiac catheter is indicated.  The patient understands that risks included but are not limited to stroke (1 in 1000), death (1 in 81), kidney failure [usually temporary] (1 in 500), bleeding (1 in 200), allergic reaction [possibly serious] (1 in 200).  The patient understands and agrees to proceed.   He needs contrast allergy prophylaxis  ESSENTIAL HYPERTENSION, BENIGN -  His blood pressure is controlled.  No change in therapy.   RBBB - This is new.  He is having no symptoms related to this.  I do not have a strong suspicion for pulmonary embolism and D-dimer was negative.   PALPITATIONS -   No further work up.    DYSLIPIDEMIA -  I would like him to have fasting lipid profile   Current medicines are reviewed at length with the patient today.  The patient does not have concerns regarding medicines.  The following changes have been made:  None  Labs/ tests ordered today include:     Orders Placed This Encounter  Procedures   CBC   Basic metabolic panel   Lipid panel   EKG 12-Lead      Disposition:   FU with with me after the  cath.    Signed, [name], MD  05/24/2021 10:53 AM    [place]

## 2021-05-23 NOTE — Telephone Encounter (Signed)
Pt c/o of Chest Pain: STAT if CP now or developed within 24 hours  1. Are you having CP right now? No   2. Are you experiencing any other symptoms (ex. SOB, nausea, vomiting, sweating)? Sob periodically   3. How long have you been experiencing CP? Since starting on the cpap machine  4. Is your CP continuous or coming and going? Comes and goes   5. Have you taken Nitroglycerin? no ?

## 2021-05-23 NOTE — Telephone Encounter (Signed)
Left message to call back  

## 2021-05-23 NOTE — Telephone Encounter (Signed)
Spoke to pt. He report for the past 2 weeks, he's experienced 3 episodes of chest discomfort. Once occurrence happened while trying to use his CPAP machine. Pt state he felt like he couldn't get a deep breath, then eventually develop chest pain. Pt also report yesterday after returning home from his normal activity, he developed chest pain. He described symptoms as a fullness/tightness that radiated up to his neck. Pt state he went to ER but left after waiting 5 hours. Pt voiced he is still experiencing chest pain at the present moment.   Nurse advised based on symptoms, to return to ER for further evaluations. Pt verbalized understanding.

## 2021-05-23 NOTE — Discharge Instructions (Signed)
You are seen here today for episodes of chest tightness.  Your lab work has been normal (D-dimer to evaluate for blood clots, troponin x2 to evaluate for heart attack were negative).  Recommend follow-up with your primary care provider as soon as you can to discuss today's ED visit.  You will also follow-up with your cardiologist tomorrow, further details are on this paperwork.  Please return to the ED for any worsening symptoms.

## 2021-05-24 ENCOUNTER — Encounter: Payer: Self-pay | Admitting: Cardiology

## 2021-05-24 ENCOUNTER — Ambulatory Visit: Payer: Medicare Other | Admitting: Cardiology

## 2021-05-24 ENCOUNTER — Ambulatory Visit (INDEPENDENT_AMBULATORY_CARE_PROVIDER_SITE_OTHER): Payer: Medicare Other | Admitting: Cardiology

## 2021-05-24 VITALS — BP 136/68 | HR 58 | Ht 74.0 in | Wt 235.2 lb

## 2021-05-24 DIAGNOSIS — E785 Hyperlipidemia, unspecified: Secondary | ICD-10-CM

## 2021-05-24 DIAGNOSIS — I2510 Atherosclerotic heart disease of native coronary artery without angina pectoris: Secondary | ICD-10-CM | POA: Diagnosis not present

## 2021-05-24 DIAGNOSIS — I10 Essential (primary) hypertension: Secondary | ICD-10-CM | POA: Diagnosis not present

## 2021-05-24 DIAGNOSIS — R002 Palpitations: Secondary | ICD-10-CM | POA: Diagnosis not present

## 2021-05-24 DIAGNOSIS — M19071 Primary osteoarthritis, right ankle and foot: Secondary | ICD-10-CM | POA: Diagnosis not present

## 2021-05-24 LAB — CBC
Hematocrit: 41.7 % (ref 37.5–51.0)
Hemoglobin: 14.2 g/dL (ref 13.0–17.7)
MCH: 30.9 pg (ref 26.6–33.0)
MCHC: 34.1 g/dL (ref 31.5–35.7)
MCV: 91 fL (ref 79–97)
Platelets: 150 10*3/uL (ref 150–450)
RBC: 4.59 x10E6/uL (ref 4.14–5.80)
RDW: 12.9 % (ref 11.6–15.4)
WBC: 6.1 10*3/uL (ref 3.4–10.8)

## 2021-05-24 LAB — BASIC METABOLIC PANEL
BUN/Creatinine Ratio: 19 (ref 10–24)
BUN: 23 mg/dL (ref 8–27)
CO2: 23 mmol/L (ref 20–29)
Calcium: 9.8 mg/dL (ref 8.6–10.2)
Chloride: 107 mmol/L — ABNORMAL HIGH (ref 96–106)
Creatinine, Ser: 1.24 mg/dL (ref 0.76–1.27)
Glucose: 94 mg/dL (ref 65–99)
Potassium: 4.4 mmol/L (ref 3.5–5.2)
Sodium: 145 mmol/L — ABNORMAL HIGH (ref 134–144)
eGFR: 61 mL/min/{1.73_m2} (ref >59)

## 2021-05-24 LAB — LIPID PANEL
Chol/HDL Ratio: 3.2 ratio (ref 0.0–5.0)
Cholesterol, Total: 123 mg/dL (ref 100–199)
HDL: 38 mg/dL — ABNORMAL LOW (ref >39)
LDL Chol Calc (NIH): 54 mg/dL (ref 0–99)
Triglycerides: 191 mg/dL — ABNORMAL HIGH (ref 0–149)
VLDL Cholesterol Cal: 31 mg/dL (ref 5–40)

## 2021-05-24 MED ORDER — PREDNISONE 50 MG PO TABS: ORAL_TABLET | ORAL | 0 refills | Status: DC

## 2021-05-24 NOTE — Patient Instructions (Signed)
Medication Instructions:  The current medical regimen is effective;  continue present plan and medications.  *If you need a refill on your cardiac medications before your next appointment, please call your pharmacy*   Lab Work: BMET, CBC today   If you have labs (blood work) drawn today and your tests are completely normal, you will receive your results only by: [place] (if you have MyChart) OR A paper copy in the mail If you have any lab test that is abnormal or we need to change your treatment, we will call you to review the results.   Testing/Procedures: Your physician has requested that you have a cardiac catheterization. Cardiac catheterization is used to diagnose and/or treat various heart conditions. Doctors may recommend this procedure for a number of different reasons. The most common reason is to evaluate chest pain. Chest pain can be a symptom of coronary artery disease (CAD), and cardiac catheterization can show whether plaque is narrowing or blocking your heart's arteries. This procedure is also used to evaluate the valves, as well as measure the blood flow and oxygen levels in different parts of your heart. For further information please visit [email or web address]. Please follow instruction sheet, as given.    Follow-Up: At [facility], you and your health needs are our priority.  As part of our continuing mission to provide you with exceptional heart care, we have created designated Provider Care Teams.  These Care Teams include your primary Cardiologist (physician) and Advanced Practice Providers (APPs -  Physician Assistants and Nurse Practitioners) who all work together to provide you with the care you need, when you need it.  We recommend signing up for the patient portal called "MyChart".  Sign up information is provided on this After Visit Summary.  MyChart is used to connect with patients for Virtual Visits (Telemedicine).  Patients are able to view lab/test  results, encounter notes, upcoming appointments, etc.  Non-urgent messages can be sent to your provider as well.   To learn more about what you can do with MyChart, go to [email or web address].    Your next appointment:   Follow up post CATH.    Other Instructions  [address] [place] [address] Dept: [phone] Loc: [place]  05/24/2021  You are scheduled for a Cardiac Catheterization on Thursday, September 14 with Dr. Harrell Gave End.  1. Please arrive at the [facility] (Main Entrance A) at [facility]: [place],  [id number] at 7:00 AM (This time is two hours before your procedure to ensure your preparation). Free valet parking service is available.   Special note: Every effort is made to have your procedure done on time. Please understand that emergencies sometimes delay scheduled procedures.  2. Diet: Do not eat solid foods after midnight.  The patient may have clear liquids until 5am upon the day of the procedure.  3. Labs: You will need to have blood drawn today- CBC, BMET. You do not need to be fasting.  4. Medication instructions in preparation for your procedure:   Contrast Allergy: Yes, Please take Prednisone 50mg  by mouth at: Thirteen hours prior to cath 8:00pm on Wednesday Seven hours prior to cath 2:00am on Thursday And prior to leaving home please take last dose of Prednisone 50mg  and Benadryl 50mg  by mouth.  On the morning of your procedure, take your Aspirin and any morning medicines NOT listed above.  You may  use sips of water.  5. Plan for one night stay--bring personal belongings. 6. Bring a current list of your medications and current insurance cards. 7. You MUST have a responsible person to drive you home. 8. Someone MUST be with you the first 24 hours after you arrive home or your discharge will be  delayed. 9. Please wear clothes that are easy to get on and off and wear slip-on shoes.  Thank you for allowing [place] to care for you!   -- [facility]

## 2021-05-24 NOTE — Telephone Encounter (Signed)
RE: adjust pressures Received: Today Denyse Amass, RN got it!   Thanks

## 2021-05-24 NOTE — Telephone Encounter (Signed)
Pt scheduled to come in with Dr Darnell Level 05/30/21 at 9:30.

## 2021-05-24 NOTE — Telephone Encounter (Signed)
Please send modified AutoPap order, I will reduce the pressure to see if he tolerates that better.

## 2021-05-24 NOTE — Telephone Encounter (Signed)
I sent message to [employer] with new order (pressures).

## 2021-05-25 ENCOUNTER — Inpatient Hospital Stay (HOSPITAL_COMMUNITY)
Admission: AD | Admit: 2021-05-25 | Discharge: 2021-05-31 | DRG: 246 | Disposition: A | Discharge status: Home / Self Care | Payer: Medicare Other | Attending: Interventional Cardiology | Admitting: Interventional Cardiology

## 2021-05-25 ENCOUNTER — Encounter (HOSPITAL_COMMUNITY)
Admission: AD | Disposition: A | Discharge status: Home / Self Care | Payer: Self-pay | Source: Home / Self Care | Attending: Internal Medicine

## 2021-05-25 ENCOUNTER — Encounter (HOSPITAL_COMMUNITY): Payer: Self-pay | Admitting: Internal Medicine

## 2021-05-25 ENCOUNTER — Other Ambulatory Visit: Payer: Self-pay

## 2021-05-25 DIAGNOSIS — Z888 Allergy status to other drugs, medicaments and biological substances status: Secondary | ICD-10-CM

## 2021-05-25 DIAGNOSIS — Z961 Presence of intraocular lens: Secondary | ICD-10-CM | POA: Diagnosis present

## 2021-05-25 DIAGNOSIS — I10 Essential (primary) hypertension: Secondary | ICD-10-CM | POA: Diagnosis present

## 2021-05-25 DIAGNOSIS — Z8249 Family history of ischemic heart disease and other diseases of the circulatory system: Secondary | ICD-10-CM

## 2021-05-25 DIAGNOSIS — E039 Hypothyroidism, unspecified: Secondary | ICD-10-CM | POA: Diagnosis present

## 2021-05-25 DIAGNOSIS — I200 Unstable angina: Secondary | ICD-10-CM | POA: Diagnosis present

## 2021-05-25 DIAGNOSIS — Z9221 Personal history of antineoplastic chemotherapy: Secondary | ICD-10-CM

## 2021-05-25 DIAGNOSIS — R4182 Altered mental status, unspecified: Secondary | ICD-10-CM | POA: Diagnosis not present

## 2021-05-25 DIAGNOSIS — K5909 Other constipation: Secondary | ICD-10-CM | POA: Diagnosis present

## 2021-05-25 DIAGNOSIS — E669 Obesity, unspecified: Secondary | ICD-10-CM | POA: Diagnosis present

## 2021-05-25 DIAGNOSIS — Z923 Personal history of irradiation: Secondary | ICD-10-CM

## 2021-05-25 DIAGNOSIS — I959 Hypotension, unspecified: Secondary | ICD-10-CM | POA: Diagnosis not present

## 2021-05-25 DIAGNOSIS — I2584 Coronary atherosclerosis due to calcified coronary lesion: Secondary | ICD-10-CM | POA: Diagnosis present

## 2021-05-25 DIAGNOSIS — Z79899 Other long term (current) drug therapy: Secondary | ICD-10-CM

## 2021-05-25 DIAGNOSIS — Z955 Presence of coronary angioplasty implant and graft: Secondary | ICD-10-CM

## 2021-05-25 DIAGNOSIS — R54 Age-related physical debility: Secondary | ICD-10-CM | POA: Diagnosis present

## 2021-05-25 DIAGNOSIS — Z6830 Body mass index (BMI) 30.0-30.9, adult: Secondary | ICD-10-CM

## 2021-05-25 DIAGNOSIS — Z808 Family history of malignant neoplasm of other organs or systems: Secondary | ICD-10-CM

## 2021-05-25 DIAGNOSIS — Z9842 Cataract extraction status, left eye: Secondary | ICD-10-CM

## 2021-05-25 DIAGNOSIS — K2901 Acute gastritis with bleeding: Secondary | ICD-10-CM

## 2021-05-25 DIAGNOSIS — Z87891 Personal history of nicotine dependence: Secondary | ICD-10-CM

## 2021-05-25 DIAGNOSIS — Z85118 Personal history of other malignant neoplasm of bronchus and lung: Secondary | ICD-10-CM

## 2021-05-25 DIAGNOSIS — I472 Ventricular tachycardia: Secondary | ICD-10-CM | POA: Diagnosis not present

## 2021-05-25 DIAGNOSIS — Z825 Family history of asthma and other chronic lower respiratory diseases: Secondary | ICD-10-CM

## 2021-05-25 DIAGNOSIS — I25110 Atherosclerotic heart disease of native coronary artery with unstable angina pectoris: Principal | ICD-10-CM | POA: Diagnosis present

## 2021-05-25 DIAGNOSIS — J449 Chronic obstructive pulmonary disease, unspecified: Secondary | ICD-10-CM | POA: Diagnosis not present

## 2021-05-25 DIAGNOSIS — Z9861 Coronary angioplasty status: Secondary | ICD-10-CM

## 2021-05-25 DIAGNOSIS — D696 Thrombocytopenia, unspecified: Secondary | ICD-10-CM | POA: Diagnosis present

## 2021-05-25 DIAGNOSIS — Z9841 Cataract extraction status, right eye: Secondary | ICD-10-CM

## 2021-05-25 DIAGNOSIS — G4733 Obstructive sleep apnea (adult) (pediatric): Secondary | ICD-10-CM | POA: Diagnosis present

## 2021-05-25 DIAGNOSIS — Z20822 Contact with and (suspected) exposure to covid-19: Secondary | ICD-10-CM | POA: Diagnosis present

## 2021-05-25 DIAGNOSIS — M109 Gout, unspecified: Secondary | ICD-10-CM | POA: Diagnosis present

## 2021-05-25 DIAGNOSIS — Z91041 Radiographic dye allergy status: Secondary | ICD-10-CM

## 2021-05-25 DIAGNOSIS — I700 Atherosclerosis of aorta: Secondary | ICD-10-CM | POA: Diagnosis present

## 2021-05-25 DIAGNOSIS — I452 Bifascicular block: Secondary | ICD-10-CM | POA: Diagnosis present

## 2021-05-25 DIAGNOSIS — K219 Gastro-esophageal reflux disease without esophagitis: Secondary | ICD-10-CM | POA: Diagnosis present

## 2021-05-25 DIAGNOSIS — Z85828 Personal history of other malignant neoplasm of skin: Secondary | ICD-10-CM

## 2021-05-25 DIAGNOSIS — Z820 Family history of epilepsy and other diseases of the nervous system: Secondary | ICD-10-CM

## 2021-05-25 DIAGNOSIS — I493 Ventricular premature depolarization: Secondary | ICD-10-CM | POA: Diagnosis present

## 2021-05-25 DIAGNOSIS — D62 Acute posthemorrhagic anemia: Secondary | ICD-10-CM | POA: Diagnosis not present

## 2021-05-25 DIAGNOSIS — Z79890 Hormone replacement therapy (postmenopausal): Secondary | ICD-10-CM

## 2021-05-25 DIAGNOSIS — E785 Hyperlipidemia, unspecified: Secondary | ICD-10-CM | POA: Diagnosis present

## 2021-05-25 HISTORY — PX: LEFT HEART CATH AND CORONARY ANGIOGRAPHY: CATH118249

## 2021-05-25 HISTORY — PX: CORONARY STENT INTERVENTION: CATH118234

## 2021-05-25 HISTORY — DX: Sleep apnea, unspecified: G47.30

## 2021-05-25 LAB — SARS CORONAVIRUS 2 (TAT 6-24 HRS): SARS Coronavirus 2: NEGATIVE

## 2021-05-25 LAB — POCT ACTIVATED CLOTTING TIME
Activated Clotting Time: 242 seconds
Activated Clotting Time: 277 seconds

## 2021-05-25 SURGERY — LEFT HEART CATH AND CORONARY ANGIOGRAPHY
Anesthesia: LOCAL

## 2021-05-25 MED ORDER — SODIUM CHLORIDE 0.9 % IV SOLN: INTRAVENOUS | Status: DC

## 2021-05-25 MED ORDER — HEPARIN SODIUM (PORCINE) 5000 UNIT/ML IJ SOLN
5000.0000 [IU] | Freq: Three times a day (TID) | INTRAMUSCULAR | Status: DC
  Administered 2021-05-25 – 2021-05-27 (×4): 5000 [IU] via SUBCUTANEOUS
  Filled 2021-05-25 (×4): qty 1

## 2021-05-25 MED ORDER — ASPIRIN 81 MG PO CHEW
81.0000 mg | CHEWABLE_TABLET | Freq: Every day | ORAL | Status: DC
  Filled 2021-05-25: qty 1

## 2021-05-25 MED ORDER — MIDAZOLAM HCL 2 MG/2ML IJ SOLN
INTRAMUSCULAR | Status: DC | PRN
  Administered 2021-05-25 (×2): 1 mg via INTRAVENOUS

## 2021-05-25 MED ORDER — DIPHENHYDRAMINE HCL 25 MG PO CAPS
50.0000 mg | ORAL_CAPSULE | Freq: Once | ORAL | Status: AC
  Filled 2021-05-25: qty 2

## 2021-05-25 MED ORDER — VERAPAMIL HCL 2.5 MG/ML IV SOLN
INTRAVENOUS | Status: AC
  Filled 2021-05-25: qty 2

## 2021-05-25 MED ORDER — SODIUM CHLORIDE 0.9 % IV SOLN: 250.0000 mL | INTRAVENOUS | Status: DC | PRN

## 2021-05-25 MED ORDER — NITROGLYCERIN 0.4 MG SL SUBL
0.4000 mg | SUBLINGUAL_TABLET | SUBLINGUAL | Status: DC | PRN
  Administered 2021-05-25: 0.4 mg via SUBLINGUAL

## 2021-05-25 MED ORDER — TICAGRELOR 90 MG PO TABS
ORAL_TABLET | ORAL | Status: DC | PRN
  Administered 2021-05-25: 180 mg via ORAL

## 2021-05-25 MED ORDER — ASPIRIN 81 MG PO CHEW
81.0000 mg | CHEWABLE_TABLET | ORAL | Status: DC
Start: 2021-05-26 — End: 2021-05-25

## 2021-05-25 MED ORDER — SODIUM CHLORIDE 0.9 % WEIGHT BASED INFUSION
1.0000 mL/kg/h | INTRAVENOUS | Status: DC
  Administered 2021-05-25: 250 mL via INTRAVENOUS

## 2021-05-25 MED ORDER — SODIUM CHLORIDE 0.9% FLUSH: 3.0000 mL | INTRAVENOUS | Status: DC | PRN

## 2021-05-25 MED ORDER — SODIUM CHLORIDE 0.9% FLUSH
3.0000 mL | Freq: Two times a day (BID) | INTRAVENOUS | Status: DC
  Administered 2021-05-25 – 2021-05-30 (×7): 3 mL via INTRAVENOUS

## 2021-05-25 MED ORDER — SENNOSIDES-DOCUSATE SODIUM 8.6-50 MG PO TABS
1.0000 | ORAL_TABLET | Freq: Two times a day (BID) | ORAL | Status: DC
  Administered 2021-05-25 – 2021-05-31 (×7): 1 via ORAL
  Filled 2021-05-25 (×11): qty 1

## 2021-05-25 MED ORDER — TICAGRELOR 90 MG PO TABS
90.0000 mg | ORAL_TABLET | Freq: Two times a day (BID) | ORAL | Status: DC
  Administered 2021-05-25 – 2021-05-26 (×3): 90 mg via ORAL
  Filled 2021-05-25 (×4): qty 1

## 2021-05-25 MED ORDER — LIDOCAINE HCL (PF) 1 % IJ SOLN
INTRAMUSCULAR | Status: AC
  Filled 2021-05-25: qty 30

## 2021-05-25 MED ORDER — SODIUM CHLORIDE 0.9% FLUSH: 3.0000 mL | Freq: Two times a day (BID) | INTRAVENOUS | Status: DC

## 2021-05-25 MED ORDER — UMECLIDINIUM BROMIDE 62.5 MCG/INH IN AEPB
1.0000 | INHALATION_SPRAY | Freq: Every day | RESPIRATORY_TRACT | Status: DC
  Administered 2021-05-26 – 2021-05-31 (×3): 1 via RESPIRATORY_TRACT
  Filled 2021-05-25: qty 7

## 2021-05-25 MED ORDER — SODIUM CHLORIDE 0.9 % WEIGHT BASED INFUSION
3.0000 mL/kg/h | INTRAVENOUS | Status: DC
  Administered 2021-05-25: 3 mL/kg/h via INTRAVENOUS

## 2021-05-25 MED ORDER — LEVOTHYROXINE SODIUM 75 MCG PO TABS
150.0000 ug | ORAL_TABLET | Freq: Every day | ORAL | Status: DC
  Administered 2021-05-26 – 2021-05-31 (×6): 150 ug via ORAL
  Filled 2021-05-25 (×6): qty 2

## 2021-05-25 MED ORDER — LIDOCAINE HCL (PF) 1 % IJ SOLN
INTRAMUSCULAR | Status: DC | PRN
  Administered 2021-05-25: 2 mL

## 2021-05-25 MED ORDER — ACETAMINOPHEN 325 MG PO TABS
650.0000 mg | ORAL_TABLET | ORAL | Status: DC | PRN
  Administered 2021-05-25: 650 mg via ORAL
  Filled 2021-05-25: qty 2

## 2021-05-25 MED ORDER — NITROGLYCERIN 0.4 MG/SPRAY TL SOLN
Status: AC
  Filled 2021-05-25: qty 4.9

## 2021-05-25 MED ORDER — HEPARIN (PORCINE) IN NACL 1000-0.9 UT/500ML-% IV SOLN
INTRAVENOUS | Status: AC
  Filled 2021-05-25: qty 500

## 2021-05-25 MED ORDER — FENTANYL CITRATE (PF) 100 MCG/2ML IJ SOLN
INTRAMUSCULAR | Status: DC | PRN
  Administered 2021-05-25 (×3): 25 ug via INTRAVENOUS

## 2021-05-25 MED ORDER — NITROGLYCERIN 1 MG/10 ML FOR IR/CATH LAB
INTRA_ARTERIAL | Status: AC
  Filled 2021-05-25: qty 10

## 2021-05-25 MED ORDER — MORPHINE SULFATE (PF) 2 MG/ML IV SOLN: 2.0000 mg | Freq: Once | INTRAVENOUS | Status: DC

## 2021-05-25 MED ORDER — ZOLPIDEM TARTRATE 5 MG PO TABS
5.0000 mg | ORAL_TABLET | Freq: Every evening | ORAL | Status: DC | PRN
  Administered 2021-05-25 – 2021-05-30 (×6): 5 mg via ORAL
  Filled 2021-05-25 (×7): qty 1

## 2021-05-25 MED ORDER — NITROGLYCERIN 1 MG/10 ML FOR IR/CATH LAB
INTRA_ARTERIAL | Status: DC | PRN
  Administered 2021-05-25 (×3): 200 ug via INTRACORONARY

## 2021-05-25 MED ORDER — HEPARIN (PORCINE) IN NACL 1000-0.9 UT/500ML-% IV SOLN
INTRAVENOUS | Status: DC | PRN
  Administered 2021-05-25 (×2): 500 mL

## 2021-05-25 MED ORDER — HEPARIN SODIUM (PORCINE) 1000 UNIT/ML IJ SOLN
INTRAMUSCULAR | Status: DC | PRN
  Administered 2021-05-25: 6000 [IU] via INTRAVENOUS
  Administered 2021-05-25: 4000 [IU] via INTRAVENOUS
  Administered 2021-05-25: 2000 [IU] via INTRAVENOUS
  Administered 2021-05-25: 5000 [IU] via INTRAVENOUS

## 2021-05-25 MED ORDER — TAMSULOSIN HCL 0.4 MG PO CAPS
0.4000 mg | ORAL_CAPSULE | Freq: Every day | ORAL | Status: DC
  Administered 2021-05-25 – 2021-05-30 (×6): 0.4 mg via ORAL
  Filled 2021-05-25 (×6): qty 1

## 2021-05-25 MED ORDER — DIPHENHYDRAMINE HCL 50 MG/ML IJ SOLN
50.0000 mg | Freq: Once | INTRAMUSCULAR | Status: AC
  Administered 2021-05-26: 50 mg via INTRAVENOUS
  Filled 2021-05-25: qty 1

## 2021-05-25 MED ORDER — MIDAZOLAM HCL 2 MG/2ML IJ SOLN
INTRAMUSCULAR | Status: AC
  Filled 2021-05-25: qty 2

## 2021-05-25 MED ORDER — FLUTICASONE FUROATE-VILANTEROL 100-25 MCG/INH IN AEPB
1.0000 | INHALATION_SPRAY | Freq: Every day | RESPIRATORY_TRACT | Status: DC
  Administered 2021-05-26 – 2021-05-31 (×3): 1 via RESPIRATORY_TRACT
  Filled 2021-05-25: qty 28

## 2021-05-25 MED ORDER — FLUTICASONE PROPIONATE 50 MCG/ACT NA SUSP
1.0000 | Freq: Every day | NASAL | Status: DC
  Administered 2021-05-26: 1 via NASAL
  Filled 2021-05-25: qty 16

## 2021-05-25 MED ORDER — PANTOPRAZOLE SODIUM 40 MG PO TBEC: 40.0000 mg | DELAYED_RELEASE_TABLET | Freq: Every day | ORAL | Status: DC

## 2021-05-25 MED ORDER — HEPARIN SODIUM (PORCINE) 1000 UNIT/ML IJ SOLN
INTRAMUSCULAR | Status: AC
  Filled 2021-05-25: qty 1

## 2021-05-25 MED ORDER — ALBUTEROL SULFATE (2.5 MG/3ML) 0.083% IN NEBU: 3.0000 mL | INHALATION_SOLUTION | Freq: Four times a day (QID) | RESPIRATORY_TRACT | Status: DC | PRN

## 2021-05-25 MED ORDER — PREDNISONE 20 MG PO TABS
50.0000 mg | ORAL_TABLET | Freq: Four times a day (QID) | ORAL | Status: AC
  Administered 2021-05-25 – 2021-05-26 (×3): 50 mg via ORAL
  Filled 2021-05-25 (×3): qty 2

## 2021-05-25 MED ORDER — FLUTICASONE-UMECLIDIN-VILANT 100-62.5-25 MCG/INH IN AEPB: 1.0000 | INHALATION_SPRAY | Freq: Every day | RESPIRATORY_TRACT | Status: DC

## 2021-05-25 MED ORDER — ASPIRIN 81 MG PO CHEW
81.0000 mg | CHEWABLE_TABLET | ORAL | Status: AC
  Administered 2021-05-26: 81 mg via ORAL
  Filled 2021-05-25: qty 1

## 2021-05-25 MED ORDER — CARVEDILOL 12.5 MG PO TABS
12.5000 mg | ORAL_TABLET | Freq: Three times a day (TID) | ORAL | Status: DC
  Administered 2021-05-25 – 2021-05-26 (×3): 12.5 mg via ORAL
  Filled 2021-05-25 (×3): qty 1

## 2021-05-25 MED ORDER — ONDANSETRON HCL 4 MG/2ML IJ SOLN: 4.0000 mg | Freq: Four times a day (QID) | INTRAMUSCULAR | Status: DC | PRN

## 2021-05-25 MED ORDER — SODIUM CHLORIDE 0.9% FLUSH
3.0000 mL | Freq: Two times a day (BID) | INTRAVENOUS | Status: DC
  Administered 2021-05-25 – 2021-05-30 (×6): 3 mL via INTRAVENOUS

## 2021-05-25 MED ORDER — IOHEXOL 350 MG/ML SOLN
INTRAVENOUS | Status: DC | PRN
  Administered 2021-05-25: 220 mL

## 2021-05-25 MED ORDER — VERAPAMIL HCL 2.5 MG/ML IV SOLN
INTRAVENOUS | Status: DC | PRN
  Administered 2021-05-25: 10 mL via INTRA_ARTERIAL

## 2021-05-25 MED ORDER — ALPRAZOLAM 0.25 MG PO TABS
0.2500 mg | ORAL_TABLET | Freq: Every evening | ORAL | Status: DC | PRN
  Administered 2021-05-25 – 2021-05-30 (×5): 0.25 mg via ORAL
  Filled 2021-05-25 (×6): qty 1

## 2021-05-25 MED ORDER — LABETALOL HCL 5 MG/ML IV SOLN: 10.0000 mg | INTRAVENOUS | Status: AC | PRN

## 2021-05-25 MED ORDER — SODIUM CHLORIDE 0.9 % IV SOLN: INTRAVENOUS | Status: AC

## 2021-05-25 MED ORDER — HYDRALAZINE HCL 20 MG/ML IJ SOLN
10.0000 mg | INTRAMUSCULAR | Status: AC | PRN
Start: 2021-05-25 — End: 2021-05-25

## 2021-05-25 MED ORDER — FENTANYL CITRATE (PF) 100 MCG/2ML IJ SOLN
INTRAMUSCULAR | Status: AC
  Filled 2021-05-25: qty 2

## 2021-05-25 SURGICAL SUPPLY — 18 items
BALLN SAPPHIRE 2.5X12 (BALLOONS) ×2
BALLN SAPPHIRE ~~LOC~~ 3.25X18 (BALLOONS) ×1 IMPLANT
BALLOON SAPPHIRE 2.5X12 (BALLOONS) IMPLANT
CATH 5FR JL3.5 JR4 ANG PIG MP (CATHETERS) ×1 IMPLANT
CATH VISTA GUIDE 6FR XBLAD3.5 (CATHETERS) ×1 IMPLANT
DEVICE RAD COMP TR BAND LRG (VASCULAR PRODUCTS) ×1 IMPLANT
GLIDESHEATH SLEND SS 6F .021 (SHEATH) ×1 IMPLANT
GUIDEWIRE INQWIRE 1.5J.035X260 (WIRE) IMPLANT
INQWIRE 1.5J .035X260CM (WIRE) ×2
KIT ENCORE 26 ADVANTAGE (KITS) ×1 IMPLANT
KIT HEART LEFT (KITS) ×2 IMPLANT
PACK CARDIAC CATHETERIZATION (CUSTOM PROCEDURE TRAY) ×2 IMPLANT
STENT ONYX FRONTIER 3.0X22 (Permanent Stent) ×1 IMPLANT
SYR MEDRAD MARK 7 150ML (SYRINGE) ×2 IMPLANT
TRANSDUCER W/STOPCOCK (MISCELLANEOUS) ×2 IMPLANT
TUBING CIL FLEX 10 FLL-RA (TUBING) ×2 IMPLANT
WIRE ASAHI GRAND SLAM 180CM (WIRE) ×2 IMPLANT
WIRE RUNTHROUGH .014X180CM (WIRE) ×1 IMPLANT

## 2021-05-25 NOTE — Progress Notes (Signed)
Progress Note  Patient Name: Travis Padilla Date of Encounter: 05/25/2021  [facility] Cardiologist: [name], MD   Subjective   Travis Padilla feels well.  He is status post PCI to mid LAD today with plan for staged atherectomy/PCI to the RCA tomorrow.  He had 2/10 chest pain following PCI to the LAD today, which resolved with sublingual nitroglycerin.  He denies shortness of breath.  Inpatient Medications    Scheduled Meds:  [START ON 05/26/2021] aspirin  81 mg Oral Daily   [START ON 05/26/2021] aspirin  81 mg Oral Pre-Cath   carvedilol  12.5 mg Oral TID   [START ON 05/26/2021] diphenhydrAMINE  50 mg Oral Once   Or   [START ON 05/26/2021] diphenhydrAMINE  50 mg Intravenous Once   [START ON 05/26/2021] fluticasone  1 spray Each Nare Daily   heparin  5,000 Units Subcutaneous Q8H   [START ON 05/26/2021] levothyroxine  150 mcg Oral Q0600   predniSONE  50 mg Oral Q6H   senna-docusate  1 tablet Oral BID   sodium chloride flush  3 mL Intravenous Q12H   sodium chloride flush  3 mL Intravenous Q12H   tamsulosin  0.4 mg Oral QHS   ticagrelor  90 mg Oral BID   Continuous Infusions:  sodium chloride 75 mL/hr at 05/25/21 1500   sodium chloride Stopped (05/25/21 1610)   sodium chloride     [START ON 05/26/2021] sodium chloride     PRN Meds: sodium chloride, sodium chloride, acetaminophen, albuterol, ALPRAZolam, hydrALAZINE, labetalol, nitroGLYCERIN, ondansetron (ZOFRAN) IV, sodium chloride flush, sodium chloride flush   Vital Signs    Vitals:   05/25/21 1431 05/25/21 1502 05/25/21 1517 05/25/21 1546  BP: 126/72 125/77 134/70 125/68  Pulse: 77 80 82 79  Resp:      Temp:      TempSrc:      SpO2: 97% 97% 98% 97%  Weight:      Height:        Intake/Output Summary (Last 24 hours) at 05/25/2021 1655 Last data filed at 05/25/2021 1500 Gross per 24 hour  Intake 133.04 ml  Output 500 ml  Net -366.96 ml   Last 3 Weights 05/25/2021 05/24/2021 05/23/2021  Weight (lbs) 222 lb 235 lb  3.2 oz 222 lb  Weight (kg) 100.699 kg 106.686 kg 100.699 kg      Telemetry    Sinus rhythm with PVCs - Personally Reviewed  ECG    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   Neck: No JVD Cardiac: RRR, no murmurs, rubs, or gallops.  TR band on the right wrist without hematoma or bleeding. Respiratory: Clear to auscultation bilaterally. GI: Soft, nontender, non-distended  MS: No edema; No deformity. Neuro:  Nonfocal  Psych: Normal affect   Labs    High Sensitivity Troponin:   Recent Labs  Lab 05/22/21 1951 05/23/21 1135  TROPONINIHS 10 16     Chemistry Recent Labs  Lab 05/22/21 1951 05/24/21 1152  NA 142 145*  K 4.9 4.4  CL 109 107*  CO2 24 23  GLUCOSE 242* 94  BUN 27* 23  CREATININE 1.28* 1.24  CALCIUM 9.7 9.8  PROT 7.6  --   ALBUMIN 4.2  --   AST 18  --   ALT 14  --   ALKPHOS 58  --   BILITOT 0.5  --   GFRNONAA 59*  --   ANIONGAP  9  --     Lipids  Recent Labs  Lab 05/24/21 1151  CHOL 123  TRIG 191*  HDL 38*  LABVLDL 31  LDLCALC 54  CHOLHDL 3.2    Hematology Recent Labs  Lab 05/22/21 1951 05/24/21 1152  WBC 9.4 6.1  RBC 4.47 4.59  HGB 13.9 14.2  HCT 40.4 41.7  MCV 90.4 91  MCH 31.1 30.9  MCHC 34.4 34.1  RDW 12.3 12.9  PLT 204 150   Thyroid No results for input(s): TSH, FREET4 in the last 168 hours.  BNPNo results for input(s): BNP, PROBNP in the last 168 hours.  DDimer  Recent Labs  Lab 05/23/21 1135  DDIMER 0.38     Radiology    CARDIAC CATHETERIZATION  Result Date: 05/25/2021 Conclusions: Severe two-vessel coronary artery disease including 80% mid LAD stenosis, chronic total occlusion of moderate-caliber D1, and sequential 70-95% proximal-mid RCA lesions with severe calcification. Low-normal left ventricular systolic function (LVEF 67-89%) with normal filling pressure (LVEDP 5-10 mmHg). Successful PCI to mid LAD using Onyx Frontier 3.0 x 22 mm  drug-eluting stent with 0% residual stenosis and TIMI-3 flow Recommendations: Plan for staged PCI to proximal/mid RCA with atherectomy as soon as tomorrow if renal function allows. Continue dual antiplatelet therapy with aspirin and ticagrelor for at least 12 months. Aggressive secondary prevention. Nelva Bush, MD [facility]   Cardiac Studies   See catheterization above  Patient Profile     75 y.o. male with history of coronary artery disease, hypertension, hyperlipidemia, obstructive sleep apnea, and lung cancer, admitted following PCI to LAD in the setting of unstable angina.  Assessment & Plan    Unstable angina: Patient with recent chest pain at rest.  Catheterization today showed severe mid LAD and proximal/mid RCA disease.  He underwent successful PCI to the mid LAD with plan for staged PCI to the RCA tomorrow.  Due to heavy calcification, atherectomy will need to be used. Continue aspirin and ticagrelor for up to 12 months. Aggressive secondary prevention. Plan for staged PCI to RCA with Dr. Angelena Form tomorrow.  Patient will be premedicated again with prednisone and diphenhydramine given history of contrast allergy.  COPD: Patient's home Trelegy not on formulary.  I have reached out to pharmacy for acceptable alternative. As needed albuterol. Await pharmacy reply regarding alternative for Trelegy.  Shared Decision Making/Informed Consent The risks [stroke (1 in 1000), death (1 in 1000), kidney failure [usually temporary] (1 in 500), bleeding (1 in 200), allergic reaction [possibly serious] (1 in 200)], benefits (diagnostic support and management of coronary artery disease) and alternatives of a cardiac catheterization were discussed in detail with Travis Padilla and he is willing to proceed.  For questions or updates, please contact [place] Please consult [email or web address] for contact info under      Signed, Nelva Bush, MD  05/25/2021, 4:55 PM

## 2021-05-25 NOTE — Interval H&P Note (Signed)
History and Physical Interval Note:  05/25/2021 9:05 AM  Travis Padilla  has presented today for surgery, with the diagnosis of unstable angina.  The various methods of treatment have been discussed with the patient and family. After consideration of risks, benefits and other options for treatment, the patient has consented to  Procedure(s): LEFT HEART CATH AND CORONARY ANGIOGRAPHY (N/A) as a surgical intervention.  The patient's history has been reviewed, patient examined, no change in status, stable for surgery.  I have reviewed the patient's chart and labs.  Questions were answered to the patient's satisfaction.    Cath Lab Visit (complete for each Cath Lab visit)  Clinical Evaluation Leading to the Procedure:   ACS: No.  Non-ACS:    Anginal Classification: CCS IV  Anti-ischemic medical therapy: Minimal Therapy (1 class of medications)  Non-Invasive Test Results: Low-risk stress test findings: cardiac mortality <1%/year  Prior CABG: No previous CABG  Travis Padilla

## 2021-05-26 ENCOUNTER — Encounter (HOSPITAL_COMMUNITY): Payer: Self-pay | Admitting: Internal Medicine

## 2021-05-26 ENCOUNTER — Ambulatory Visit (HOSPITAL_COMMUNITY): Admission: RE | Admit: 2021-05-26 | Payer: Medicare Other | Source: Home / Self Care | Admitting: Cardiovascular Disease

## 2021-05-26 ENCOUNTER — Encounter (HOSPITAL_COMMUNITY)
Admission: AD | Disposition: A | Discharge status: Home / Self Care | Payer: Self-pay | Source: Home / Self Care | Attending: Internal Medicine

## 2021-05-26 DIAGNOSIS — I452 Bifascicular block: Secondary | ICD-10-CM | POA: Diagnosis present

## 2021-05-26 DIAGNOSIS — Z9841 Cataract extraction status, right eye: Secondary | ICD-10-CM | POA: Diagnosis not present

## 2021-05-26 DIAGNOSIS — I700 Atherosclerosis of aorta: Secondary | ICD-10-CM | POA: Diagnosis not present

## 2021-05-26 DIAGNOSIS — I25110 Atherosclerotic heart disease of native coronary artery with unstable angina pectoris: Secondary | ICD-10-CM | POA: Diagnosis not present

## 2021-05-26 DIAGNOSIS — Z955 Presence of coronary angioplasty implant and graft: Secondary | ICD-10-CM | POA: Diagnosis not present

## 2021-05-26 DIAGNOSIS — K254 Chronic or unspecified gastric ulcer with hemorrhage: Secondary | ICD-10-CM | POA: Diagnosis not present

## 2021-05-26 DIAGNOSIS — Z9842 Cataract extraction status, left eye: Secondary | ICD-10-CM | POA: Diagnosis not present

## 2021-05-26 DIAGNOSIS — D696 Thrombocytopenia, unspecified: Secondary | ICD-10-CM | POA: Diagnosis not present

## 2021-05-26 DIAGNOSIS — Z20822 Contact with and (suspected) exposure to covid-19: Secondary | ICD-10-CM | POA: Diagnosis not present

## 2021-05-26 DIAGNOSIS — Z961 Presence of intraocular lens: Secondary | ICD-10-CM | POA: Diagnosis present

## 2021-05-26 DIAGNOSIS — Z923 Personal history of irradiation: Secondary | ICD-10-CM | POA: Diagnosis not present

## 2021-05-26 DIAGNOSIS — K3189 Other diseases of stomach and duodenum: Secondary | ICD-10-CM | POA: Diagnosis not present

## 2021-05-26 DIAGNOSIS — E782 Mixed hyperlipidemia: Secondary | ICD-10-CM | POA: Diagnosis not present

## 2021-05-26 DIAGNOSIS — Z85118 Personal history of other malignant neoplasm of bronchus and lung: Secondary | ICD-10-CM | POA: Diagnosis not present

## 2021-05-26 DIAGNOSIS — Z6830 Body mass index (BMI) 30.0-30.9, adult: Secondary | ICD-10-CM | POA: Diagnosis not present

## 2021-05-26 DIAGNOSIS — D62 Acute posthemorrhagic anemia: Secondary | ICD-10-CM | POA: Diagnosis not present

## 2021-05-26 DIAGNOSIS — K2901 Acute gastritis with bleeding: Secondary | ICD-10-CM | POA: Diagnosis not present

## 2021-05-26 DIAGNOSIS — I200 Unstable angina: Secondary | ICD-10-CM | POA: Diagnosis not present

## 2021-05-26 DIAGNOSIS — E039 Hypothyroidism, unspecified: Secondary | ICD-10-CM | POA: Diagnosis not present

## 2021-05-26 DIAGNOSIS — J449 Chronic obstructive pulmonary disease, unspecified: Secondary | ICD-10-CM | POA: Diagnosis not present

## 2021-05-26 DIAGNOSIS — K922 Gastrointestinal hemorrhage, unspecified: Secondary | ICD-10-CM | POA: Diagnosis not present

## 2021-05-26 DIAGNOSIS — R54 Age-related physical debility: Secondary | ICD-10-CM | POA: Diagnosis not present

## 2021-05-26 DIAGNOSIS — I10 Essential (primary) hypertension: Secondary | ICD-10-CM | POA: Diagnosis not present

## 2021-05-26 DIAGNOSIS — Z79899 Other long term (current) drug therapy: Secondary | ICD-10-CM | POA: Diagnosis not present

## 2021-05-26 DIAGNOSIS — I472 Ventricular tachycardia: Secondary | ICD-10-CM | POA: Diagnosis not present

## 2021-05-26 DIAGNOSIS — E669 Obesity, unspecified: Secondary | ICD-10-CM | POA: Diagnosis present

## 2021-05-26 DIAGNOSIS — Z9861 Coronary angioplasty status: Secondary | ICD-10-CM | POA: Diagnosis not present

## 2021-05-26 DIAGNOSIS — G4733 Obstructive sleep apnea (adult) (pediatric): Secondary | ICD-10-CM | POA: Diagnosis present

## 2021-05-26 DIAGNOSIS — Z85828 Personal history of other malignant neoplasm of skin: Secondary | ICD-10-CM | POA: Diagnosis not present

## 2021-05-26 DIAGNOSIS — I959 Hypotension, unspecified: Secondary | ICD-10-CM | POA: Diagnosis not present

## 2021-05-26 DIAGNOSIS — E785 Hyperlipidemia, unspecified: Secondary | ICD-10-CM | POA: Diagnosis present

## 2021-05-26 HISTORY — PX: CORONARY ATHERECTOMY: CATH118238

## 2021-05-26 HISTORY — PX: CORONARY STENT INTERVENTION: CATH118234

## 2021-05-26 HISTORY — PX: TEMPORARY PACEMAKER: CATH118268

## 2021-05-26 HISTORY — PX: CORONARY ULTRASOUND/IVUS: CATH118244

## 2021-05-26 LAB — BASIC METABOLIC PANEL
Anion gap: 9 (ref 5–15)
BUN: 27 mg/dL — ABNORMAL HIGH (ref 8–23)
CO2: 20 mmol/L — ABNORMAL LOW (ref 22–32)
Calcium: 8.9 mg/dL (ref 8.9–10.3)
Chloride: 109 mmol/L (ref 98–111)
Creatinine, Ser: 0.96 mg/dL (ref 0.61–1.24)
GFR, Estimated: >60 mL/min (ref >60)
Glucose, Bld: 174 mg/dL — ABNORMAL HIGH (ref 70–99)
Potassium: 4.3 mmol/L (ref 3.5–5.1)
Sodium: 138 mmol/L (ref 135–145)

## 2021-05-26 LAB — CBC
HCT: 35 % — ABNORMAL LOW (ref 39.0–52.0)
Hemoglobin: 12.5 g/dL — ABNORMAL LOW (ref 13.0–17.0)
MCH: 31.5 pg (ref 26.0–34.0)
MCHC: 35.7 g/dL (ref 30.0–36.0)
MCV: 88.2 fL (ref 80.0–100.0)
Platelets: 210 10*3/uL (ref 150–400)
RBC: 3.97 MIL/uL — ABNORMAL LOW (ref 4.22–5.81)
RDW: 12 % (ref 11.5–15.5)
WBC: 15.9 10*3/uL — ABNORMAL HIGH (ref 4.0–10.5)
nRBC: 0 % (ref 0.0–0.2)

## 2021-05-26 LAB — POCT ACTIVATED CLOTTING TIME
Activated Clotting Time: 237 seconds
Activated Clotting Time: 381 seconds
Activated Clotting Time: 364 seconds

## 2021-05-26 SURGERY — CORONARY ATHERECTOMY
Anesthesia: LOCAL

## 2021-05-26 MED ORDER — LIDOCAINE HCL (PF) 1 % IJ SOLN
INTRAMUSCULAR | Status: DC | PRN
  Administered 2021-05-26: 15 mL
  Administered 2021-05-26: 2 mL

## 2021-05-26 MED ORDER — VERAPAMIL HCL 2.5 MG/ML IV SOLN
INTRAVENOUS | Status: AC
  Filled 2021-05-26: qty 2

## 2021-05-26 MED ORDER — CARVEDILOL 25 MG PO TABS
25.0000 mg | ORAL_TABLET | Freq: Two times a day (BID) | ORAL | Status: DC
  Administered 2021-05-26: 25 mg via ORAL
  Filled 2021-05-26 (×2): qty 1

## 2021-05-26 MED ORDER — MIDAZOLAM HCL 2 MG/2ML IJ SOLN
INTRAMUSCULAR | Status: DC | PRN
  Administered 2021-05-26: 1 mg via INTRAVENOUS

## 2021-05-26 MED ORDER — SODIUM CHLORIDE 0.9 % IV SOLN: INTRAVENOUS | Status: AC

## 2021-05-26 MED ORDER — ACETAMINOPHEN 325 MG PO TABS
650.0000 mg | ORAL_TABLET | ORAL | Status: DC | PRN
  Administered 2021-05-26 – 2021-05-28 (×3): 650 mg via ORAL
  Filled 2021-05-26 (×3): qty 2

## 2021-05-26 MED ORDER — HEPARIN SODIUM (PORCINE) 1000 UNIT/ML IJ SOLN
INTRAMUSCULAR | Status: AC
  Filled 2021-05-26: qty 1

## 2021-05-26 MED ORDER — HEPARIN (PORCINE) IN NACL 1000-0.9 UT/500ML-% IV SOLN
INTRAVENOUS | Status: AC
  Filled 2021-05-26: qty 1500

## 2021-05-26 MED ORDER — LIDOCAINE HCL (PF) 1 % IJ SOLN
INTRAMUSCULAR | Status: AC
  Filled 2021-05-26: qty 30

## 2021-05-26 MED ORDER — FENTANYL CITRATE (PF) 100 MCG/2ML IJ SOLN
INTRAMUSCULAR | Status: DC | PRN
  Administered 2021-05-26: 25 ug via INTRAVENOUS

## 2021-05-26 MED ORDER — CARVEDILOL 12.5 MG PO TABS
12.5000 mg | ORAL_TABLET | Freq: Once | ORAL | Status: AC
  Administered 2021-05-26: 12.5 mg via ORAL
  Filled 2021-05-26: qty 1

## 2021-05-26 MED ORDER — SODIUM CHLORIDE 0.9 % IV SOLN
5.0000 mg/kg | Freq: Once | INTRAVENOUS | Status: AC
  Administered 2021-05-26: 531 mg via INTRAVENOUS
  Filled 2021-05-26: qty 21.24

## 2021-05-26 MED ORDER — VIPERSLIDE LUBRICANT OPTIME: TOPICAL | Status: DC | PRN

## 2021-05-26 MED ORDER — ONDANSETRON HCL 4 MG/2ML IJ SOLN: 4.0000 mg | Freq: Four times a day (QID) | INTRAMUSCULAR | Status: DC | PRN

## 2021-05-26 MED ORDER — FENTANYL CITRATE (PF) 100 MCG/2ML IJ SOLN
INTRAMUSCULAR | Status: AC
  Filled 2021-05-26: qty 2

## 2021-05-26 MED ORDER — HYDRALAZINE HCL 20 MG/ML IJ SOLN: 10.0000 mg | INTRAMUSCULAR | Status: AC | PRN

## 2021-05-26 MED ORDER — MIDAZOLAM HCL 2 MG/2ML IJ SOLN
INTRAMUSCULAR | Status: AC
  Filled 2021-05-26: qty 2

## 2021-05-26 MED ORDER — VERAPAMIL HCL 2.5 MG/ML IV SOLN
INTRAVENOUS | Status: DC | PRN
  Administered 2021-05-26: 10 mL via INTRA_ARTERIAL

## 2021-05-26 MED ORDER — NITROGLYCERIN 1 MG/10 ML FOR IR/CATH LAB
INTRA_ARTERIAL | Status: AC
  Filled 2021-05-26: qty 10

## 2021-05-26 MED ORDER — HEPARIN (PORCINE) IN NACL 1000-0.9 UT/500ML-% IV SOLN
INTRAVENOUS | Status: DC | PRN
  Administered 2021-05-26 (×3): 500 mL

## 2021-05-26 MED ORDER — IOHEXOL 350 MG/ML SOLN
INTRAVENOUS | Status: DC | PRN
  Administered 2021-05-26: 105 mL

## 2021-05-26 MED ORDER — SODIUM CHLORIDE 0.9% FLUSH: 3.0000 mL | INTRAVENOUS | Status: DC | PRN

## 2021-05-26 MED ORDER — SODIUM CHLORIDE 0.9 % IV SOLN
INTRAVENOUS | Status: AC | PRN
  Administered 2021-05-26: 10 mL/h via INTRAVENOUS

## 2021-05-26 MED ORDER — SODIUM CHLORIDE 0.9% FLUSH
3.0000 mL | Freq: Two times a day (BID) | INTRAVENOUS | Status: DC
  Administered 2021-05-28 – 2021-05-31 (×7): 3 mL via INTRAVENOUS

## 2021-05-26 MED ORDER — HEPARIN SODIUM (PORCINE) 1000 UNIT/ML IJ SOLN
INTRAMUSCULAR | Status: DC | PRN
  Administered 2021-05-26: 4000 [IU] via INTRAVENOUS
  Administered 2021-05-26: 7000 [IU] via INTRAVENOUS

## 2021-05-26 MED ORDER — LABETALOL HCL 5 MG/ML IV SOLN: 10.0000 mg | INTRAVENOUS | Status: AC | PRN

## 2021-05-26 MED ORDER — SODIUM CHLORIDE 0.9 % IV SOLN: 250.0000 mL | INTRAVENOUS | Status: DC | PRN

## 2021-05-26 SURGICAL SUPPLY — 32 items
BALLN SAPPHIRE 3.0X12 (BALLOONS) ×4
BALLN SAPPHIRE ~~LOC~~ 4.0X12 (BALLOONS) ×1 IMPLANT
BALLN SAPPHIRE ~~LOC~~ 4.0X18 (BALLOONS) ×1 IMPLANT
BALLOON SAPPHIRE 3.0X12 (BALLOONS) IMPLANT
CABLE ADAPT PACING TEMP 12FT (ADAPTER) ×1 IMPLANT
CATH OPTICROSS HD (CATHETERS) ×1 IMPLANT
CATH S G BIP PACING (CATHETERS) ×1 IMPLANT
CATH TELEPORT (CATHETERS) ×1 IMPLANT
CATH VISTA GUIDE 7FR JR4 (CATHETERS) ×1 IMPLANT
CLOSURE PERCLOSE PROSTYLE (VASCULAR PRODUCTS) ×1 IMPLANT
CROWN DIAMONDBACK CLASSIC 1.25 (BURR) ×1 IMPLANT
DEVICE RAD COMP TR BAND LRG (VASCULAR PRODUCTS) ×1 IMPLANT
ELECT DEFIB PAD ADLT CADENCE (PAD) ×1 IMPLANT
GLIDESHEATH SLENDER 7FR .021G (SHEATH) ×1 IMPLANT
GUIDELINER 6F (CATHETERS) ×1 IMPLANT
KIT ENCORE 26 ADVANTAGE (KITS) ×1 IMPLANT
KIT HEART LEFT (KITS) ×2 IMPLANT
KIT MICROPUNCTURE NIT STIFF (SHEATH) ×1 IMPLANT
LUBRICANT VIPERSLIDE CORONARY (MISCELLANEOUS) ×2 IMPLANT
PACK CARDIAC CATHETERIZATION (CUSTOM PROCEDURE TRAY) ×2 IMPLANT
SHEATH PINNACLE 6F 10CM (SHEATH) ×1 IMPLANT
SHEATH PROBE COVER 6X72 (BAG) ×1 IMPLANT
SLED PULL BACK IVUS (MISCELLANEOUS) ×1 IMPLANT
SLEEVE REPOSITIONING LENGTH 30 (MISCELLANEOUS) ×1 IMPLANT
STENT ONYX FRONTIER 3.5X38 (Permanent Stent) ×1 IMPLANT
STENT ONYX FRONTIER 4.0X22 (Permanent Stent) ×1 IMPLANT
TRANSDUCER W/STOPCOCK (MISCELLANEOUS) ×2 IMPLANT
TUBING CIL FLEX 10 FLL-RA (TUBING) ×2 IMPLANT
WIRE ASAHI PROWATER 180CM (WIRE) ×1 IMPLANT
WIRE ASAHI PROWATER 300CM (WIRE) ×1 IMPLANT
WIRE EMERALD 3MM-J .035X260CM (WIRE) ×1 IMPLANT
WIRE VIPERWIRE COR FLEX .012 (WIRE) ×1 IMPLANT

## 2021-05-26 NOTE — Progress Notes (Signed)
   05/26/21 1834  Assess: MEWS Score  Temp 99 F (37.2 C)  BP 117/88  Pulse Rate (!) 118  ECG Heart Rate (!) 118  Resp 18  Assess: MEWS Score  MEWS Temp 0  MEWS Systolic 0  MEWS Pulse 2  MEWS RR 0  MEWS LOC 0  MEWS Score 2  MEWS Score Color Yellow  Assess: if the MEWS score is Yellow or Red  Were vital signs taken at a resting state? Yes  Focused Assessment No change from prior assessment  Early Detection of Sepsis Score *See Row Information* Medium  MEWS guidelines implemented *See Row Information* Yes  Take Vital Signs  Increase Vital Sign Frequency  Yellow: Q 2hr X 2 then Q 4hr X 2, if remains yellow, continue Q 4hrs  Notify: Charge Nurse/RN  Name of Charge Nurse/RN Notified Carroll Kinds, RN  Date Charge Nurse/RN Notified 05/26/21  Time Charge Nurse/RN Notified 1850  Document  Patient Outcome Other (Comment) (Stable)  Progress note created (see row info) Yes

## 2021-05-26 NOTE — Progress Notes (Addendum)
Progress Note  Patient Name: Travis Padilla Date of Encounter: 05/26/2021  [facility] Cardiologist: [name], MD   Subjective   Had a little SOB this morning. No recurrent chest pain. We reviewed his heart catheterization results in detail. He understands plans for staged intervention today. All questions answered.   Inpatient Medications    Scheduled Meds:  aspirin  81 mg Oral Daily   carvedilol  12.5 mg Oral TID   diphenhydrAMINE  50 mg Oral Once   Or   diphenhydrAMINE  50 mg Intravenous Once   fluticasone  1 spray Each Nare Daily   umeclidinium bromide  1 puff Inhalation Daily   And   fluticasone furoate-vilanterol  1 puff Inhalation Daily   heparin  5,000 Units Subcutaneous Q8H   levothyroxine  150 mcg Oral Q0600   predniSONE  50 mg Oral Q6H   senna-docusate  1 tablet Oral BID   sodium chloride flush  3 mL Intravenous Q12H   sodium chloride flush  3 mL Intravenous Q12H   tamsulosin  0.4 mg Oral QHS   ticagrelor  90 mg Oral BID   Continuous Infusions:  sodium chloride Stopped (05/25/21 1610)   sodium chloride     sodium chloride 75 mL/hr at 05/26/21 0649   PRN Meds: sodium chloride, sodium chloride, acetaminophen, albuterol, ALPRAZolam, nitroGLYCERIN, ondansetron (ZOFRAN) IV, sodium chloride flush, sodium chloride flush, zolpidem   Vital Signs    Vitals:   05/26/21 0012 05/26/21 0014 05/26/21 0322 05/26/21 0607  BP: (!) 160/77 134/76 127/76   Pulse:   74   Resp:   17   Temp:   97.7 F (36.5 C)   TempSrc:   Oral   SpO2:   97%   Weight:    106.2 kg  Height:        Intake/Output Summary (Last 24 hours) at 05/26/2021 0749 Last data filed at 05/25/2021 2033 Gross per 24 hour  Intake 488.04 ml  Output 950 ml  Net -461.96 ml   Last 3 Weights 05/26/2021 05/25/2021 05/24/2021  Weight (lbs) 234 lb 1.6 oz 222 lb 235 lb 3.2 oz  Weight (kg) 106.187 kg 100.699 kg 106.686 kg      Telemetry    Sinus rhythm/sinus tachycardia with occasional PVCs and 6  beat run of NSVT overnight. - Personally Reviewed  ECG    Sinus rhythm, rate 86 bpm, chronic RBBB/LAFB, no significant change from previous - Personally Reviewed  Physical Exam   GEN: No acute distress.   Neck: No JVD Cardiac: RRR, no murmurs, rubs, or gallops. Radial cath site without hematoma/bruising - dressing C/D/I Respiratory: Clear to auscultation bilaterally. GI: Soft, nontender, non-distended  MS: No edema; No deformity. Neuro:  Nonfocal  Psych: Normal affect   Labs    High Sensitivity Troponin:   Recent Labs  Lab 05/22/21 1951 05/23/21 1135  TROPONINIHS 10 16     Chemistry Recent Labs  Lab 05/22/21 1951 05/24/21 1152 05/26/21 0609  NA 142 145* 138  K 4.9 4.4 4.3  CL 109 107* 109  CO2 24 23 20*  GLUCOSE 242* 94 174*  BUN 27* 23 27*  CREATININE 1.28* 1.24 0.96  CALCIUM 9.7 9.8 8.9  PROT 7.6  --   --   ALBUMIN 4.2  --   --   AST 18  --   --   ALT 14  --   --   ALKPHOS 58  --   --   BILITOT 0.5  --   --  GFRNONAA 59*  --  >60  ANIONGAP 9  --  9    Lipids  Recent Labs  Lab 05/24/21 1151  CHOL 123  TRIG 191*  HDL 38*  LABVLDL 31  LDLCALC 54  CHOLHDL 3.2    Hematology Recent Labs  Lab 05/22/21 1951 05/24/21 1152 05/26/21 0609  WBC 9.4 6.1 15.9*  RBC 4.47 4.59 3.97*  HGB 13.9 14.2 12.5*  HCT 40.4 41.7 35.0*  MCV 90.4 91 88.2  MCH 31.1 30.9 31.5  MCHC 34.4 34.1 35.7  RDW 12.3 12.9 12.0  PLT 204 150 210   Thyroid No results for input(s): TSH, FREET4 in the last 168 hours.  BNPNo results for input(s): BNP, PROBNP in the last 168 hours.  DDimer  Recent Labs  Lab 05/23/21 1135  DDIMER 0.38     Radiology    CARDIAC CATHETERIZATION  Result Date: 05/25/2021 Conclusions: Severe two-vessel coronary artery disease including 80% mid LAD stenosis, chronic total occlusion of moderate-caliber D1, and sequential 70-95% proximal-mid RCA lesions with severe calcification. Low-normal left ventricular systolic function (LVEF 32-95%) with normal  filling pressure (LVEDP 5-10 mmHg). Successful PCI to mid LAD using Onyx Frontier 3.0 x 22 mm drug-eluting stent with 0% residual stenosis and TIMI-3 flow Recommendations: Plan for staged PCI to proximal/mid RCA with atherectomy as soon as tomorrow if renal function allows. Continue dual antiplatelet therapy with aspirin and ticagrelor for at least 12 months. Aggressive secondary prevention. Nelva Bush, MD [facility]   Cardiac Studies   LHC 05/25/21: Conclusions: Severe two-vessel coronary artery disease including 80% mid LAD stenosis, chronic total occlusion of moderate-caliber D1, and sequential 70-95% proximal-mid RCA lesions with severe calcification. Low-normal left ventricular systolic function (LVEF 18-84%) with normal filling pressure (LVEDP 5-10 mmHg). Successful PCI to mid LAD using Onyx Frontier 3.0 x 22 mm drug-eluting stent with 0% residual stenosis and TIMI-3 flow   Recommendations: Plan for staged PCI to proximal/mid RCA with atherectomy as soon as tomorrow if renal function allows. Continue dual antiplatelet therapy with aspirin and ticagrelor for at least 12 months. Aggressive secondary prevention.  Diagnostic Dominance: Right Intervention    Echocardiogram 01/2021: 1. Left ventricular ejection fraction, by estimation, is 50 to 55%. The  left ventricle has low normal function. The left ventricle has no regional  wall motion abnormalities. There is mild concentric left ventricular  hypertrophy. Indeterminate diastolic  filling due to E-A fusion.   2. Right ventricular systolic function is normal. The right ventricular  size is normal. Tricuspid regurgitation signal is inadequate for assessing  PA pressure.   3. The mitral valve is grossly normal. No evidence of mitral valve  regurgitation. No evidence of mitral stenosis.   4. The aortic valve is tricuspid. Aortic valve regurgitation is trivial.  No aortic stenosis is present.   5. The inferior vena cava is  normal in size with greater than 50%  respiratory variability, suggesting right atrial pressure of 3 mmHg.   Comparison(s): No significant change from prior study.  Patient Profile     75 y.o. male with a PMH of non-obstructive CAD, HTN, HLD, COPD, lung cancer s/p chemo, and OSA, who presented for outpatient LHC 05/25/21, admitted for staged intervention to RCA following PCI/DES to LAD 05/25/21.   Assessment & Plan    1. Unstable angina in patient with CAD: seen outpatient 05/24/21 with c/f unstable angina. LHC arranged for 05/25/21 showing severe 2-vessel CAD with 80% mLAD stenosis and CTO of moderate caliber p-mRCA stenosis, as well  as 100% D1 stenosis with R>L collaterals and 25% pLCx stenosis. He underwent successful PCI/DES to LAD with plans for staged atherectomy to RCA today. He was started on aspirin and brilinta for DAPT with anticipate 12 month uninterrupted course.  - Keep NPO for staged intervention to RCA today - Continue aspirin and brilinta - Continue statin - Continue Bblocker  2. HTN: BP generally above goal of <130/80 this admission - Will increase carvedilol to 25mg  BID (on 12.5mg  TID prior to admission) - If remains elevated, would consider addition of losartan if Cr stable post-cath or amlodipine  3. HLD: LDL 54 05/24/21, well controlled on praluent - Continue praluent post-discharge   4. COPD: breathing stable. Trelegy not on formulary - Continue Breo while admitted  5. OSA: had positive sleep study but has not set up CPAP - Needs CPAP after discharge  For questions or updates, please contact [place] Please consult [email or web address] for contact info under        Signed, Abigail Butts, PA-C  05/26/2021, 7:49 AM    History and all data above reviewed.  Patient examined.  I agree with the findings as above.   No pain.  Lying flat back.  No SOB.  He is now post PCI staged of the RCA.  Doing well.  No The patient exam reveals COR:RRR  ,  Lungs: Clear  ,  Abd:  Positive bowel sounds, no rebound no guarding, Ext Right radial with pressure dressing in place.  Right femoral without bleeding or bruising (prepped for possible pacer not required.)   .  All available labs, radiology testing, previous records reviewed. Agree with documented assessment and plan. CAD:  Medicla management as above.  HTN:  beta blocker increased.  Likely home in AM.   [name]  2:47 PM  05/26/2021

## 2021-05-26 NOTE — Care Management (Signed)
[id number] 05-26-21 Benefits check submitted for Brilinta. Case Manager will follow for cost.

## 2021-05-26 NOTE — Interval H&P Note (Signed)
History and Physical Interval Note:  05/26/2021 11:01 AM  Travis Padilla  has presented today for surgery, with the diagnosis of cad.  The various methods of treatment have been discussed with the patient and family. After consideration of risks, benefits and other options for treatment, the patient has consented to  Procedure(s): CORONARY ATHERECTOMY (N/A) as a surgical intervention.  The patient's history has been reviewed, patient examined, no change in status, stable for surgery.  I have reviewed the patient's chart and labs.  Questions were answered to the patient's satisfaction.    Cath Lab Visit (complete for each Cath Lab visit)  Clinical Evaluation Leading to the Procedure:   ACS: No.  Non-ACS:    Anginal Classification: CCS III  Anti-ischemic medical therapy: Minimal Therapy (1 class of medications)  Non-Invasive Test Results: No non-invasive testing performed  Prior CABG: No previous CABG        Lauree Chandler

## 2021-05-26 NOTE — TOC Benefit Eligibility Note (Signed)
Transition of Care [facility]) Benefit Eligibility Note    Patient Details  Name: Travis Padilla MRN: [id number] Date of Birth: December 23, 1945   Medication/Dose: Kary Kos 90mg  twice a day  Covered?: Yes  Tier: Other (Not Available)  Prescription Coverage Preferred Pharmacy: Any Retail Pharmacy  Spoke with Person/Company/Phone Number:: Derrill Memo [facility] /[phone]  Co-Pay: 113.14 for a 30 day supply  Prior Approval: No  Deductible: Unmet  Additional Notes: Clopidogrel 75mg  BID is ant Alternative 2.22 for a 30 dy supply  /Plavix , Ticagrelor are no on the [place] Phone Number: 05/26/2021, 3:57 PM

## 2021-05-27 DIAGNOSIS — K922 Gastrointestinal hemorrhage, unspecified: Secondary | ICD-10-CM

## 2021-05-27 DIAGNOSIS — I200 Unstable angina: Secondary | ICD-10-CM | POA: Diagnosis not present

## 2021-05-27 LAB — HEMOGLOBIN AND HEMATOCRIT, BLOOD
HCT: 24.7 % — ABNORMAL LOW (ref 39.0–52.0)
Hemoglobin: 8.8 g/dL — ABNORMAL LOW (ref 13.0–17.0)

## 2021-05-27 LAB — CBC
HCT: 22.4 % — ABNORMAL LOW (ref 39.0–52.0)
Hemoglobin: 7.9 g/dL — ABNORMAL LOW (ref 13.0–17.0)
MCH: 31.3 pg (ref 26.0–34.0)
MCHC: 35.3 g/dL (ref 30.0–36.0)
MCV: 88.9 fL (ref 80.0–100.0)
Platelets: 185 10*3/uL (ref 150–400)
RBC: 2.52 MIL/uL — ABNORMAL LOW (ref 4.22–5.81)
RDW: 12.3 % (ref 11.5–15.5)
WBC: 14.9 10*3/uL — ABNORMAL HIGH (ref 4.0–10.5)
nRBC: 0 % (ref 0.0–0.2)

## 2021-05-27 LAB — ABO/RH: ABO/RH(D): O POS

## 2021-05-27 LAB — MRSA NEXT GEN BY PCR, NASAL: MRSA by PCR Next Gen: NOT DETECTED

## 2021-05-27 LAB — PREPARE RBC (CROSSMATCH)

## 2021-05-27 LAB — BASIC METABOLIC PANEL
Anion gap: 11 (ref 5–15)
BUN: 74 mg/dL — ABNORMAL HIGH (ref 8–23)
CO2: 16 mmol/L — ABNORMAL LOW (ref 22–32)
Calcium: 8.7 mg/dL — ABNORMAL LOW (ref 8.9–10.3)
Chloride: 110 mmol/L (ref 98–111)
Creatinine, Ser: 1.12 mg/dL (ref 0.61–1.24)
GFR, Estimated: >60 mL/min (ref >60)
Glucose, Bld: 160 mg/dL — ABNORMAL HIGH (ref 70–99)
Potassium: 3.9 mmol/L (ref 3.5–5.1)
Sodium: 137 mmol/L (ref 135–145)

## 2021-05-27 LAB — GLUCOSE, CAPILLARY: Glucose-Capillary: 173 mg/dL — ABNORMAL HIGH (ref 70–99)

## 2021-05-27 LAB — OCCULT BLOOD X 1 CARD TO LAB, STOOL: Fecal Occult Bld: POSITIVE — AB

## 2021-05-27 MED ORDER — SODIUM CHLORIDE 0.9% IV SOLUTION: Freq: Once | INTRAVENOUS | Status: AC

## 2021-05-27 MED ORDER — ASPIRIN EC 81 MG PO TBEC
81.0000 mg | DELAYED_RELEASE_TABLET | Freq: Every day | ORAL | Status: DC
  Filled 2021-05-27: qty 1

## 2021-05-27 MED ORDER — SUCRALFATE 1 G PO TABS
1.0000 g | ORAL_TABLET | Freq: Three times a day (TID) | ORAL | Status: DC
  Administered 2021-05-27 – 2021-05-31 (×14): 1 g via ORAL
  Filled 2021-05-27 (×20): qty 1

## 2021-05-27 MED ORDER — SODIUM CHLORIDE 0.9 % IV BOLUS
250.0000 mL | Freq: Once | INTRAVENOUS | Status: AC
  Administered 2021-05-27: 250 mL via INTRAVENOUS

## 2021-05-27 MED ORDER — PANTOPRAZOLE SODIUM 40 MG PO TBEC
40.0000 mg | DELAYED_RELEASE_TABLET | Freq: Every day | ORAL | Status: DC
  Administered 2021-05-27: 40 mg via ORAL
  Filled 2021-05-27: qty 1

## 2021-05-27 MED ORDER — CHLORHEXIDINE GLUCONATE CLOTH 2 % EX PADS
6.0000 | MEDICATED_PAD | Freq: Every day | CUTANEOUS | Status: DC
  Administered 2021-05-27 – 2021-05-31 (×5): 6 via TOPICAL

## 2021-05-27 MED ORDER — TICAGRELOR 90 MG PO TABS: 90.0000 mg | ORAL_TABLET | Freq: Two times a day (BID) | ORAL | 0 refills | Status: DC

## 2021-05-27 NOTE — Progress Notes (Signed)
Patient heart rate 120-127 and exhibiting bigeminy. Cards Fellow paged.

## 2021-05-27 NOTE — Progress Notes (Signed)
Pt HR currently ST, low 100-110s with frequent PVCs on the tele monitor. Pt c/o of headache noted 2/10 and feeling fatigued. Pt denies CP or SOB at the current time. Pt assisted from chair to bed, tylenol given for headache. Will continue to monitor for any acute changes.

## 2021-05-27 NOTE — Progress Notes (Addendum)
Progress Note  Patient Name: Travis Padilla Date of Encounter: 05/27/2021  Primary Cardiologist: [name], MD   Subjective   Says he did not sleep well last night. Has felt fatigued this AM and has experienced episodes of flushing along his head and chest. Denies any recurrent chest pain.  Inpatient Medications    Scheduled Meds:  aspirin  81 mg Oral Daily   carvedilol  25 mg Oral BID WC   fluticasone  1 spray Each Nare Daily   umeclidinium bromide  1 puff Inhalation Daily   And   fluticasone furoate-vilanterol  1 puff Inhalation Daily   heparin  5,000 Units Subcutaneous Q8H   levothyroxine  150 mcg Oral Q0600   senna-docusate  1 tablet Oral BID   sodium chloride flush  3 mL Intravenous Q12H   sodium chloride flush  3 mL Intravenous Q12H   sodium chloride flush  3 mL Intravenous Q12H   tamsulosin  0.4 mg Oral QHS   ticagrelor  90 mg Oral BID   Continuous Infusions:  sodium chloride Stopped (05/25/21 1610)   sodium chloride     sodium chloride 75 mL/hr at 05/27/21 0300   sodium chloride Stopped (05/26/21 1615)   PRN Meds: sodium chloride, sodium chloride, sodium chloride, acetaminophen, albuterol, ALPRAZolam, nitroGLYCERIN, ondansetron (ZOFRAN) IV, sodium chloride flush, sodium chloride flush, sodium chloride flush, zolpidem   Vital Signs    Vitals:   05/26/21 2009 05/26/21 2200 05/27/21 0201 05/27/21 0358  BP: 100/76 (!) 108/56 105/69 100/68  Pulse: (!) 115 (!) 101 100 (!) 105  Resp: 20 18 19 16   Temp: 98.3 F (36.8 C) 98.8 F (37.1 C) 97.8 F (36.6 C) 97.7 F (36.5 C)  TempSrc: Oral Oral Oral Oral  SpO2: 99% 100%  98%  Weight:      Height:        Intake/Output Summary (Last 24 hours) at 05/27/2021 0732 Last data filed at 05/27/2021 0558 Gross per 24 hour  Intake 1054.14 ml  Output 2825 ml  Net -1770.86 ml    Last 3 Weights 05/26/2021 05/25/2021 05/24/2021  Weight (lbs) 234 lb 1.6 oz 222 lb 235 lb 3.2 oz  Weight (kg) 106.187 kg 100.699 kg 106.686  kg      Telemetry    Appears most consistent with sinus tachycardia, HR in low-100's to 110's. Peaking into 140's at times.  - Personally Reviewed  ECG    Sinus tachycardia, HR 113 with PVC's, RBBB and LAFB. - Personally Reviewed  Physical Exam   General: Well developed, well nourished, male appearing in no acute distress. Head: Normocephalic, atraumatic.  Neck: Supple without bruits, JVD not elevated. Lungs:  Resp regular and unlabored, CTA without wheezing or rales. Heart: Regular rhythm, tachycardiac. S1, S2, no S3, S4, or murmur; no rub. Abdomen: Soft, non-tender, non-distended with normoactive bowel sounds. No hepatomegaly. No rebound/guarding. No obvious abdominal masses. No evidence of retroperitoneal bleeding.   Extremities: No clubbing, cyanosis, or pitting edema. Distal pedal pulses are 2+ bilaterally. Right groin without ecchymosis or evidence of a hematoma.  Neuro: Alert and oriented X 3. Moves all extremities spontaneously. Psych: Normal affect.  Labs    Chemistry Recent Labs  Lab 05/22/21 1951 05/24/21 1152 05/26/21 0609 05/27/21 0425  NA 142 145* 138 137  K 4.9 4.4 4.3 3.9  CL 109 107* 109 110  CO2 24 23 20* 16*  GLUCOSE 242* 94 174* 160*  BUN 27* 23 27* 74*  CREATININE 1.28* 1.24 0.96 1.12  CALCIUM 9.7 9.8 8.9 8.7*  PROT 7.6  --   --   --   ALBUMIN 4.2  --   --   --   AST 18  --   --   --   ALT 14  --   --   --   ALKPHOS 58  --   --   --   BILITOT 0.5  --   --   --   GFRNONAA 59*  --  >60 >60  ANIONGAP 9  --  9 11     Hematology Recent Labs  Lab 05/24/21 1152 05/26/21 0609 05/27/21 0425  WBC 6.1 15.9* 14.9*  RBC 4.59 3.97* 2.52*  HGB 14.2 12.5* 7.9*  HCT 41.7 35.0* 22.4*  MCV 91 88.2 88.9  MCH 30.9 31.5 31.3  MCHC 34.1 35.7 35.3  RDW 12.9 12.0 12.3  PLT 150 210 185    Cardiac EnzymesNo results for input(s): TROPONINI in the last 168 hours. No results for input(s): TROPIPOC in the last 168 hours.   BNPNo results for input(s): BNP,  PROBNP in the last 168 hours.   DDimer  Recent Labs  Lab 05/23/21 1135  DDIMER 0.38     Cardiac Studies   Cardiac Catheterization: 05/25/2021 Conclusions: Severe two-vessel coronary artery disease including 80% mid LAD stenosis, chronic total occlusion of moderate-caliber D1, and sequential 70-95% proximal-mid RCA lesions with severe calcification. Low-normal left ventricular systolic function (LVEF 30-86%) with normal filling pressure (LVEDP 5-10 mmHg). Successful PCI to mid LAD using Onyx Frontier 3.0 x 22 mm drug-eluting stent with 0% residual stenosis and TIMI-3 flow   Recommendations: Plan for staged PCI to proximal/mid RCA with atherectomy as soon as tomorrow if renal function allows. Continue dual antiplatelet therapy with aspirin and ticagrelor for at least 12 months. Aggressive secondary prevention.   Coronary Atherectomy: 05/26/2021   Prox RCA-1 lesion is 95% stenosed.   Prox RCA-2 lesion is 70% stenosed.   Mid RCA lesion is 80% stenosed.   A stent was successfully placed.   A stent was successfully placed.   Post intervention, there is a 0% residual stenosis.   Post intervention, there is a 0% residual stenosis.   Post intervention, there is a 0% residual stenosis.   1.  Successful stenting of high-grade calcified serial lesions of the proximal and mid right coronary artery with 2 overlapping drug-eluting stents with IVUS optimization.   Recommendation: The patient could should continue dual antiplatelet therapy for at least 6 months and aggressive cardiovascular risk factor modification.  Patient Profile     75 y.o. 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 on 05/25/2021 for a planned cardiac catheterization.   Assessment & Plan    1. CAD/Unstable Angina - The patient was evaluated at [facility] ED on 05/23/2021 for chest pain and Hs troponin values  were negative. He did follow-up with Dr. [name] and a catheterization was recommended for definitive evaluation.  - Initial cardiac catheterization showed 2-vessel CAD with 80% mid-LAD stenosis, CTO of D1 and sequential 70-95% proximal-mid RCA lesions and underwent PCI to the mid-LAD with DESx1. Was recommended to have staged PCI of the RCA and this was performed on 05/26/2021 with DESx2 to the proximal and mid RCA.  - Remains on ASA, Brilinta, Coreg and Praluent.   2. HTN - BP has been low-normal at 100/68 - 128/75 within the  past 24 hours. He has been continued on Coreg 25mg  BID.   3. HLD - LDL at 54 when checked earlier this month. On Praluent as an outpatient.   4. COPD/OSA - He was on Trelegy prior to admission and has been receiving Breo this admission. Prior sleep study was positive for CPAP but has not been arranged. Sleep apnea followed by Dr. Rexene Alberts.   5. Anemia - Hgb dropped from 12.5 on 9/16 to 7.9 today. Will recheck CBC and consider transfusion. May need abdominal imaging.    ADDENDUM: While in the room with the patient's nurse examining for an RP Bleed, the patient became very diaphoretic and dizzy. While sitting in the chair, his eyes rolled back into his head and he became unresponsive for several seconds. Was still in sinus tachycardia on telemetry. In less than 20 seconds, he was responsive and answering questions appropriately but very diaphoretic and SBP had dropped into the 70's. Dr. Acie Fredrickson came to evaluate the patient as well. Will give 250 mL fluid bolus now (SBP already improving into the low-100's). Will order type and screen along with 1 unit pRBC's. Recheck H&H after transfusion. Will transfer to [facility] for closer monitoring. Dr. Acie Fredrickson will discuss with the Interventional Team about holding ASA and Brilinta as he does not have evidence of an RP or groin bleed on exam but does report very dark tarry stools starting last night. Will check occult blood card.    For questions  or updates, please contact [place] Please consult [email or web address] for contact info under Cardiology/STEMI.   Arna Medici , PA-C 7:32 AM 05/27/2021 Pager: [phone]  Attending Note:   The patient was seen and examined.  Agree with assessment and plan as noted above.  Changes made to the above note as needed.  Patient seen and independently examined with [place] .   We discussed all aspects of the encounter. I agree with the assessment and plan as stated above.   Acute hypotensive episode: The patient became acutely hypotensive and had altered mental status while talking to [place], [place].  He was found to have a blood pressure of 75.  He was somewhat pale and became very diaphoretic.  He had already walked with cardiac rehab and did not report any problems. His hemoglobin was found to be acutely lower compared to yesterday.  He informed us that he had dark tarry stools this morning.  I suspect that he is having a GI bleed.  We will hold Brilinta and aspirin for today.  I discussed the case with Dr. Ellyn Hack.  He suggested that we restart Brilinta alone or perhaps Plavix alone and not restart aspirin given his GI bleed.  He will need a GI work-up at some point.  2.  Apparent GI bleed: Patient complained of dark sticky tarry stools this morning.  This is associated with an almost 5 g drop in his hemoglobin.  He also became hypotensive and had some altered mental status this morning.  I suspect that he is had an upper GI bleed  Hold Brilinta and aspirin for today.  We will transfuse him 1 unit of packed red blood cells.  Transfer to the ICU.  Continue to monitor closely.  2.  Coronary artery disease: He has had stent procedures on 2 consecutive days.  Unfortunately he has had an apparent GI bleed and will need to hold his DAPT short-term.  He is not having any angina.     I have spent  a total of 40 minutes with patient reviewing hospital  notes ,  telemetry, EKGs, labs and examining patient as well as establishing an assessment and plan that was discussed with the patient.  > 50% of time was spent in direct patient care.    Thayer Headings, Brooke Bonito., MD, [facility] 05/27/2021, 10:01 AM [address]  [place] Pager [phone]

## 2021-05-27 NOTE — Progress Notes (Addendum)
CARDIAC REHAB PHASE I   PRE:  Rate/Rhythm: 111 ST PVCs  BP:  Sitting: 101/83      SaO2: 99 RA  MODE:  Ambulation: 250 ft   POST:  Rate/Rhythm: 142 ST  BP:  Sitting: 114/86      SaO2: 100 RA  Pt tolerated exercise well and amb 250 ft with minimal assist. Pt stated to feel "warm in the head and chest," but denied CP, SOB, or dizziness. He took a couple of standing rest breaks and was breathing heavy. Check o2 sat, 100 RA. Pt had to use the bathroom during the walk, so he ambulated at a faster pace to get back to room. Pt assisted to toilet and then to recliner. Educated pt on risk factors, angina/NTG use, restrictions, exercise guidelines, and heart healthy diet. Stressed importance of ASA/brilinta. Pt demonstrated understanding of ed. Discussed CRPHII. Told pt I could refer him to [place], but he prefers to come to [place]. Will refer to [place]. Will continue to follow.  [place], MS, ACSM-CEP 05/27/2021 9:05 AM

## 2021-05-28 DIAGNOSIS — I200 Unstable angina: Secondary | ICD-10-CM | POA: Diagnosis not present

## 2021-05-28 DIAGNOSIS — K2901 Acute gastritis with bleeding: Secondary | ICD-10-CM | POA: Diagnosis not present

## 2021-05-28 LAB — CBC WITH DIFFERENTIAL/PLATELET
Abs Immature Granulocytes: 0.22 10*3/uL — ABNORMAL HIGH (ref 0.00–0.07)
Basophils Absolute: 0 10*3/uL (ref 0.0–0.1)
Basophils Relative: 0 %
Eosinophils Absolute: 0.1 10*3/uL (ref 0.0–0.5)
Eosinophils Relative: 1 %
HCT: 21.1 % — ABNORMAL LOW (ref 39.0–52.0)
Hemoglobin: 7.3 g/dL — ABNORMAL LOW (ref 13.0–17.0)
Immature Granulocytes: 3 %
Lymphocytes Relative: 17 %
Lymphs Abs: 1.4 10*3/uL (ref 0.7–4.0)
MCH: 31.6 pg (ref 26.0–34.0)
MCHC: 34.6 g/dL (ref 30.0–36.0)
MCV: 91.3 fL (ref 80.0–100.0)
Monocytes Absolute: 1.2 10*3/uL — ABNORMAL HIGH (ref 0.1–1.0)
Monocytes Relative: 14 %
Neutro Abs: 5.3 10*3/uL (ref 1.7–7.7)
Neutrophils Relative %: 65 %
Platelets: 116 10*3/uL — ABNORMAL LOW (ref 150–400)
RBC: 2.31 MIL/uL — ABNORMAL LOW (ref 4.22–5.81)
RDW: 13.2 % (ref 11.5–15.5)
WBC: 8.2 10*3/uL (ref 4.0–10.5)
nRBC: 0 % (ref 0.0–0.2)

## 2021-05-28 LAB — BASIC METABOLIC PANEL
Anion gap: 7 (ref 5–15)
BUN: 37 mg/dL — ABNORMAL HIGH (ref 8–23)
CO2: 21 mmol/L — ABNORMAL LOW (ref 22–32)
Calcium: 8.2 mg/dL — ABNORMAL LOW (ref 8.9–10.3)
Chloride: 111 mmol/L (ref 98–111)
Creatinine, Ser: 1.03 mg/dL (ref 0.61–1.24)
GFR, Estimated: >60 mL/min (ref >60)
Glucose, Bld: 120 mg/dL — ABNORMAL HIGH (ref 70–99)
Potassium: 4 mmol/L (ref 3.5–5.1)
Sodium: 139 mmol/L (ref 135–145)

## 2021-05-28 LAB — CBC
HCT: 25.6 % — ABNORMAL LOW (ref 39.0–52.0)
Hemoglobin: 8.9 g/dL — ABNORMAL LOW (ref 13.0–17.0)
MCH: 30.2 pg (ref 26.0–34.0)
MCHC: 34.8 g/dL (ref 30.0–36.0)
MCV: 86.8 fL (ref 80.0–100.0)
Platelets: 144 10*3/uL — ABNORMAL LOW (ref 150–400)
RBC: 2.95 MIL/uL — ABNORMAL LOW (ref 4.22–5.81)
RDW: 14.4 % (ref 11.5–15.5)
WBC: 10.3 10*3/uL (ref 4.0–10.5)
nRBC: 0.3 % — ABNORMAL HIGH (ref 0.0–0.2)

## 2021-05-28 LAB — HEMOGLOBIN AND HEMATOCRIT, BLOOD
HCT: 22.9 % — ABNORMAL LOW (ref 39.0–52.0)
Hemoglobin: 8 g/dL — ABNORMAL LOW (ref 13.0–17.0)

## 2021-05-28 LAB — PREPARE RBC (CROSSMATCH)

## 2021-05-28 LAB — MAGNESIUM: Magnesium: 1.7 mg/dL (ref 1.7–2.4)

## 2021-05-28 MED ORDER — PANTOPRAZOLE SODIUM 40 MG IV SOLR
40.0000 mg | Freq: Two times a day (BID) | INTRAVENOUS | Status: DC
  Administered 2021-05-28 – 2021-05-29 (×3): 40 mg via INTRAVENOUS
  Filled 2021-05-28 (×3): qty 40

## 2021-05-28 MED ORDER — SODIUM CHLORIDE 0.9% IV SOLUTION: Freq: Once | INTRAVENOUS | Status: AC

## 2021-05-28 MED ORDER — SODIUM CHLORIDE 0.9 % IV SOLN
0.7500 ug/kg/min | INTRAVENOUS | Status: DC
  Administered 2021-05-28 – 2021-05-29 (×2): 0.75 ug/kg/min via INTRAVENOUS
  Filled 2021-05-28 (×4): qty 50

## 2021-05-28 NOTE — Progress Notes (Addendum)
   Following up patient this afternoon. Hgb dropped again this morning 7.3, given additional 1 PRBCs with follow up H/H pending. GI has been consulted, initial recommendations for EGD tomorrow as he ate breakfast this morning, still pending MD evaluation. He has been off DAPT now since yesterday, last dose of Brilinta was 9/16 at 2148. With recent PCI/DES x3 this admission high level of concern for in stent thrombosis. Discussed with interventional MD on call (Dr. Ellyn Hack), will go ahead and start cangrelor in the interim as this can be paused if need for EGD but provides coverage with fresh stents. Continue with close monitoring of CBC and will transfuse PRN for Hgb >8.  Most recent HR 89, BP 105/71. Will await GI input.   Barnet Pall, NP-C 05/28/2021, 2:02 PM Pager: [phone]  Addendum: H/H 8/22.9 this afternoon. Now s/p 2 units PRBCs total. With the need for cangrelor will give a additional unit now in order to hopefully maintain Hgb above 8.

## 2021-05-28 NOTE — Progress Notes (Signed)
[place] NOTE  Pharmacy  18 yoM admitted with unstable angina s/p DES to LAD 9/15 and atherectomy/DES to RCA 9/16. Dark stools reported with Hgb drop on 9/17 with brief episode of unresponsiveness. GI planning EGD at some point, timing is unclear. Cardiology requesting cangrelor as a bridge until definitive GI plans are established to protect stent during the acute period.  Plan: Cangrelor 0.75 mcg/kg/min Watch closely for further S/Sx bleeding  Arrie Senate, PharmD, BCPS, [facility] Clinical Pharmacist [phone] Please check AMION for all [place] numbers 05/28/2021

## 2021-05-28 NOTE — Progress Notes (Addendum)
    Hgb this AM resulted at 7.3. Notified Dr. Acie Fredrickson and will give 1 unit pRBC's and also consult GI to see as he will likely need an EGD. Dr. Acie Fredrickson has spoken with the Interventional Team and will continue to hold ASA and Brilinta for now but can hopefully start back Plavix once Hgb stabilizes.   Signed, Erma Heritage, PA-C 05/28/2021, 9:42 AM

## 2021-05-28 NOTE — H&P (View-Only) (Signed)
Referring Provider:  [facility]         Primary Care Physician:  [name], DO Primary Gastroenterologist:  Dr. Laural Golden Reason for Consultation:   GI bleed                ASSESSMENT / PLAN   # 75 yo male with melena and acute drop in hgb on Brillinta and ASA. Rule out PUD --Post transfusion ( 1 unit PRBC) hgb 8.8 but declined to 7.9. Getting another [facility] now.  --Brillinta and ASA on hold.  --Patient had breakfast today so unable to proceed with an EGD. Will scheduled EGD to be done tomorrow. The risks and benefits of EGD with possible biopsies were discussed with the patient who agrees to proceed.  --No further bleeding today.  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 Not currently on therapy  # OSA on CPAP  # Chronic constipation. Manages with Miralax, Dulcolax and prunes  # See below for additional medical problems  HISTORY OF PRESENT ILLNESS                                                                                                                         Chief Complaint: GI bleed  Travis Padilla is a 75 y.o. male with a past medical history significant for stage IV small cell lung cancer, OSA, HTN, HLD, gout, hypothyroidism, obesitySee PMH for any additional medical history.   Patient admitted  with unstable angina. He underwent cardiac cath on 9/14. He had a PCI to mid LAD which was to be followed by a staged atherectomy / PCI to RCA. On 9/16 he DES x 2 to proximal and mid RCA. He is now on [place].  Patient transferred to CCU yesterday for hypotension and hgb drog from 12.7 to 7.9. Hgb improved but declined again. He hasn't had any BMs / bleeding today. Patient has chronic constipation but no other GI complaints. No routine use of NSAIDs though recently took 2 full ASA ( twice) for chest pain. He hasn't seen Dr. Laural Golden in years. He takes  Protonix as needed for reflux   SIGNIFICANT DIAGNOTIC STUDIES    04/21/21 CT chest / abd / pelvis for lung cancer restating IMPRESSION: 1. Stable (over the past several CT scans) lobulated left upper lobe lung mass. 2. Stable radiation changes involving the right hilum and paramediastinal lung. No findings suspicious for recurrent tumor. 3. No mediastinal or hilar mass or adenopathy. 4. Stable 17 mm left axillary soft tissue lesion. 5. No findings for abdominal/pelvic metastatic disease. 6. Stable bilateral renal calculi. 7. Stable advanced atherosclerotic calcifications involving the thoracic and abdominal aorta and branch vessels including the coronary arteries. 8. Aortic atherosclerosis.      PREVIOUS ENDOSCOPIC EVALUATIONS   Feb 2016 screening colonoscopy. Complete exam with excellent prep. 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) ([place]) dx'd 04/17/18   to LN, infrahilar mass, lung nodule and lt axilla   Obesity, unspecified 03/28/2009   Qualifier: Diagnosis of  By: [name], MD, [name]     Palpitations 03/28/2009   Qualifier: Diagnosis of  By: [name], MD, [name]     Right lower lobe lung mass 04/22/2018   Skin cancer    Sleep apnea    SNORING 03/28/2009   Qualifier: Diagnosis of  By: [name], MD, [name]     Spinal stenosis    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[place]) 05/13/2018    Past Surgical History:   Procedure Laterality Date   basal skin cancer N/A 2019   Nose   BELPHAROPTOSIS REPAIR Bilateral    CARDIAC CATHETERIZATION     CATARACT EXTRACTION W/ INTRAOCULAR LENS  IMPLANT, BILATERAL     COLONOSCOPY N/A 11/03/2014   Procedure: COLONOSCOPY;  Surgeon: Rogene Houston, MD;  [facility];  Service: Endoscopy;  Laterality: N/A;  1225   CORONARY ATHERECTOMY N/A 05/26/2021   Procedure: CORONARY ATHERECTOMY;  Surgeon: Early Osmond, MD;  Location: [place] [facility];  Service: Cardiovascular;  Laterality: N/A;   CORONARY STENT INTERVENTION N/A 05/25/2021   Procedure: CORONARY STENT INTERVENTION;  Surgeon: Nelva Bush, MD;  Location: [place] [facility];  Service: Cardiovascular;  Laterality: N/A;   CORONARY STENT INTERVENTION N/A 05/26/2021   Procedure: CORONARY STENT INTERVENTION;  Surgeon: Early Osmond, MD;  Location: [place] [facility];  Service: Cardiovascular;  Laterality: N/A;   INTRAVASCULAR ULTRASOUND/IVUS N/A 05/26/2021   Procedure: Intravascular Ultrasound/IVUS;  Surgeon: Early Osmond, MD;  Location: [place] [facility];  Service: Cardiovascular;  Laterality: N/A;   IR CV LINE INJECTION  08/24/2020   IR FLUORO GUIDED NEEDLE PLC ASPIRATION/INJECTION LOC  11/28/2020   IR IMAGING GUIDED PORT INSERTION  07/11/2018   KNEE CARTILAGE SURGERY Left    Left knee   LEFT HEART CATH AND CORONARY ANGIOGRAPHY N/A 05/25/2021   Procedure: LEFT HEART CATH AND CORONARY ANGIOGRAPHY;  Surgeon: Nelva Bush, MD;  Location: [place] [facility];  Service: Cardiovascular;  Laterality: N/A;   SKIN CANCER EXCISION  12/2014, 04/25/15   TEMPORARY PACEMAKER N/A 05/26/2021   Procedure: TEMPORARY PACEMAKER;  Surgeon: Early Osmond, MD;  Location: [place] [facility];  Service: Cardiovascular;  Laterality: N/A;   VIDEO BRONCHOSCOPY Bilateral 05/09/2018   Procedure: VIDEO BRONCHOSCOPY WITH FLUORO;  Surgeon: Juanito Doom, MD;  Location: [facility];  Service: Cardiopulmonary;   Laterality: Bilateral;    Prior to Admission medications   Medication Sig Start Date End Date Taking? Authorizing Provider  acetaminophen (TYLENOL) 500 MG tablet Take 1,000 mg by mouth every 6 (six) hours as needed.   Yes [provider]  albuterol (VENTOLIN HFA) 108 (90 Base) MCG/ACT inhaler Inhale 2 puffs into the lungs every 6 (six) hours as needed for  wheezing or shortness of breath. 04/04/21  Yes Icard, Octavio Graves, DO  ALPRAZolam (XANAX) 0.25 MG tablet Take 1 tablet (0.25 mg total) by mouth at bedtime as needed for anxiety. 10/19/20  Yes Curt Bears, MD  Artificial Tear Solution (SOOTHE XP OP) Place 1 drop into both eyes 2 (two) times daily.   Yes [provider]  carvedilol (COREG) 12.5 MG tablet Take 1 tablet (12.5 mg total) by mouth in the morning, at noon, and at bedtime. 12/21/20  Yes [name], MD  diphenhydrAMINE (BENADRYL) 50 MG tablet Take 1 tablet (50 mg total) by mouth as directed. Take 1 tablet one hour prior to Ct scans 01/19/21  Yes Curt Bears, MD  fluticasone [facility]) 50 MCG/ACT nasal spray Place 1 spray into both nostrils daily. 07/01/20  Yes Lauraine Rinne, NP  Fluticasone-Umeclidin-Vilant (TRELEGY ELLIPTA) 100-62.5-25 MCG/INH AEPB Inhale 1 puff into the lungs daily. 03/20/21  Yes Icard, Octavio Graves, DO  levothyroxine (SYNTHROID) 150 MCG tablet Take 1 tablet (150 mcg total) by mouth daily before breakfast. 05/01/21  Yes Heilingoetter, Cassandra L, PA-C  polyethylene glycol powder (GLYCOLAX/MIRALAX) 17 GM/SCOOP powder Take 1 Container by mouth once.   Yes [provider]  PRALUENT 150 MG/ML SOAJ INJECT 150 MG INTO THE SKIN EVERY 14 (FOURTEEN) DAYS. 04/24/21  Yes [name], MD  predniSONE (DELTASONE) 50 MG tablet Take 1 tablet (50 mg total) by mouth as directed. Take one tablet 13 hours , one tablet 7 hours and one tablet  1 hour prior to scan 01/19/21  Yes Curt Bears, MD  predniSONE (DELTASONE) 50 MG tablet Pt to take 50 mg of  prednisone on 05/22/21 at 1230 am, 50 mg of prednisone on 05/22/21 at 6:30 am, and 50 mg of prednisone on 05/22/21 at 1230 pm. Pt is also to take 50 mg of benadryl on 05/22/21 at 1230 pm. Please call [phone] with any questions. 05/03/21  Yes San Morelle, MD  predniSONE (DELTASONE) 50 MG tablet Take 1 tablet 13 hours before CATH, take 1 tablet 7 hours before CATH, and 1 tablet before leaving home. 05/24/21  Yes [name], MD  senna-docusate (SENNA S) 8.6-50 MG tablet 1 to 2 twice daily for constipation 07/07/18  Yes Tanner, Lyndon Code., PA-C  tamsulosin (FLOMAX) 0.4 MG CAPS capsule Take 0.4 mg by mouth at bedtime. 09/29/20  Yes [provider]  methocarbamol (ROBAXIN) 500 MG tablet Take 500 mg by mouth daily. 08/11/19   [provider]  pantoprazole (PROTONIX) 40 MG tablet TAKE 1 TABLET (40 MG TOTAL) BY MOUTH DAILY FOR 2 WEEKS PER FLAREUP OF REFLUX 01/30/21   Ronnie Doss M, DO  Rimegepant Sulfate (NURTEC) 75 MG TBDP Dissolve 1 tablet in mouth at onset of migraine ONCE daily if needed. 01/17/21   [name], DO  ticagrelor (BRILINTA) 90 MG TABS tablet Take 1 tablet (90 mg total) by mouth 2 (two) times daily. 05/27/21   Erma Heritage, PA-C    Current Facility-Administered Medications  Medication Dose Route Frequency Provider Last Rate Last Admin   0.9 %  sodium chloride infusion  250 mL Intravenous PRN Ahmed Prima, Fransisco Hertz, PA-C   Stopped at 05/25/21 1610   0.9 %  sodium chloride infusion  250 mL Intravenous PRN Ahmed Prima, [place] M, PA-C       0.9 %  sodium chloride infusion  250 mL Intravenous PRN Erma Heritage, PA-C   Stopped at 05/26/21 1615   acetaminophen (TYLENOL) tablet 650 mg  650 mg Oral Q4H  PRN Erma Heritage, PA-C   650 mg at 05/28/21 1017   albuterol (PROVENTIL) (2.5 MG/3ML) 0.083% nebulizer solution 3 mL  3 mL Inhalation Q6H PRN Ahmed Prima, [place] M, PA-C       ALPRAZolam Duanne Moron) tablet 0.25 mg  0.25 mg Oral QHS PRN Ahmed Prima, Brittany M,  PA-C   0.25 mg at 05/27/21 2142   Chlorhexidine Gluconate Cloth 2 % PADS 6 each  6 each Topical Daily End, Christopher, MD   6 each at 05/28/21 1019   fluticasone (FLONASE) 50 MCG/ACT nasal spray 1 spray  1 spray Each Nare Daily Strader, Brittany M, PA-C   1 spray at 05/26/21 0932   umeclidinium bromide (INCRUSE ELLIPTA) 62.5 MCG/INH 1 puff  1 puff Inhalation Daily Bernerd Pho M, PA-C   1 puff at 05/27/21 0850   And   fluticasone furoate-vilanterol (BREO ELLIPTA) 100-25 MCG/INH 1 puff  1 puff Inhalation Daily Erma Heritage, PA-C   1 puff at 05/27/21 0850   levothyroxine (SYNTHROID) tablet 150 mcg  150 mcg Oral Q0600 Erma Heritage, PA-C   150 mcg at 05/28/21 0603   nitroGLYCERIN (NITROSTAT) SL tablet 0.4 mg  0.4 mg Sublingual Q5 min PRN Bernerd Pho M, PA-C   0.4 mg at 05/25/21 1207   ondansetron (ZOFRAN) injection 4 mg  4 mg Intravenous Q6H PRN Ahmed Prima, Brittany M, PA-C       pantoprazole (PROTONIX) injection 40 mg  40 mg Intravenous Q12H Nahser, Wonda Cheng, MD   40 mg at 05/28/21 1017   senna-docusate (Senokot-S) tablet 1 tablet  1 tablet Oral BID Erma Heritage, PA-C   1 tablet at 05/26/21 0272   sodium chloride flush (NS) 0.9 % injection 3 mL  3 mL Intravenous Q12H Bernerd Pho M, PA-C   3 mL at 05/28/21 1019   sodium chloride flush (NS) 0.9 % injection 3 mL  3 mL Intravenous PRN Ahmed Prima, Brittany M, PA-C       sodium chloride flush (NS) 0.9 % injection 3 mL  3 mL Intravenous Q12H Strader, Brittany M, PA-C   3 mL at 05/28/21 1017   sodium chloride flush (NS) 0.9 % injection 3 mL  3 mL Intravenous PRN Ahmed Prima, Brittany M, PA-C       sodium chloride flush (NS) 0.9 % injection 3 mL  3 mL Intravenous Q12H Strader, [place] M, PA-C   3 mL at 05/28/21 1018   sodium chloride flush (NS) 0.9 % injection 3 mL  3 mL Intravenous PRN Ahmed Prima, [place] M, PA-C       sucralfate (CARAFATE) tablet 1 g  1 g Oral TID WC & HS Strader, [place] M, PA-C   1 g at 05/28/21 1017    tamsulosin (FLOMAX) capsule 0.4 mg  0.4 mg Oral QHS Strader, [place] M, PA-C   0.4 mg at 05/27/21 2142   zolpidem (AMBIEN) tablet 5 mg  5 mg Oral QHS PRN Erma Heritage, PA-C   5 mg at 05/27/21 2141    Allergies as of 05/24/2021 - Review Complete 05/24/2021  Allergen Reaction Noted   Pravastatin  04/07/2020   Iodine Hives and Rash 08/16/2020    Family History  Problem Relation Age of Onset   Heart attack Father    Heart failure Mother    Parkinson's disease Mother    Healthy Sister    Skin cancer Sister    Neuropathy Brother    COPD Brother    Epilepsy Brother    Healthy Sister  Healthy Sister    Colon cancer Neg Hx     Social History   Socioeconomic History   Marital status: Married    Spouse name: Not on file   Number of children: 1   Years of education: Not on file   Highest education level: Not on file  Occupational History   Occupation: Government social research officer  Tobacco Use   Smoking status: Former    Packs/day: 0.20    Years: 2.00    Pack years: 0.40    Types: Cigarettes    Start date: 10/01/1968    Quit date: 1972    Years since quitting: 50.7   Smokeless tobacco: Never  Vaping Use   Vaping Use: Never used  Substance and Sexual Activity   Alcohol use: No    Comment: hx of    Drug use: No   Sexual activity: Not on file  Other Topics Concern   Not on file  Social History Narrative   Unable to ask intimate partner violence questions, wife present   Social Determinants of Health   Financial Resource Strain: Not on file  Food Insecurity: Not on file  Transportation Needs: Not on file  Physical Activity: Not on file  Stress: Not on file  Social Connections: Not on file  Intimate Partner Violence: Not on file    Review of Systems: All systems reviewed and negative except where noted in HPI.  OBJECTIVE    Physical Exam: Vital signs in last 24 hours: Temp:  [97.9 F (36.6 C)-98.7 F (37.1 C)] 98.7 F (37.1 C) (09/18 1011) Pulse Rate:   [53-117] 107 (09/17 1700) Resp:  [14-25] 14 (09/18 1011) BP: (80-133)/(39-92) 102/72 (09/18 1011) SpO2:  [97 %-100 %] 98 % (09/18 1011) Last BM Date: 05/27/21 General:   Alert  male in NAD Psych:  Pleasant, cooperative. Normal mood and affect. Eyes:  Pupils equal, sclera clear, no icterus.   Conjunctiva pink. Ears:  Normal auditory acuity. Nose:  No deformity, discharge,  or lesions. Neck:  Supple; no masses Lungs:  Clear throughout to auscultation.   No wheezes, crackles, or rhonchi.  Heart:  Regular rate and rhythm;  no lower extremity edema Abdomen:  Soft, non-distended, mild LLQ tenderness.  BS active, no palp mass   Rectal:  Deferred  Msk:  Symmetrical without gross deformities. . Neurologic:  Alert and  oriented x4;  grossly normal neurologically. Skin:  Intact without significant lesions or rashes.   Scheduled inpatient medications  Chlorhexidine Gluconate Cloth  6 each Topical Daily   fluticasone  1 spray Each Nare Daily   umeclidinium bromide  1 puff Inhalation Daily   And   fluticasone furoate-vilanterol  1 puff Inhalation Daily   levothyroxine  150 mcg Oral Q0600   pantoprazole (PROTONIX) IV  40 mg Intravenous Q12H   senna-docusate  1 tablet Oral BID   sodium chloride flush  3 mL Intravenous Q12H   sodium chloride flush  3 mL Intravenous Q12H   sodium chloride flush  3 mL Intravenous Q12H   sucralfate  1 g Oral TID WC & HS   tamsulosin  0.4 mg Oral QHS      Intake/Output from previous day: 09/17 0701 - 09/18 0700 In: 940 [I.V.:500; Blood:440] Out: 1937 [Urine:1075] Intake/Output this shift: Total I/O In: 2107.3 [P.O.:120; I.V.:1987.3] Out: -    Lab Results: Recent Labs    05/26/21 0609 05/27/21 0425 05/27/21 1735 05/28/21 0600  WBC 15.9* 14.9*  --  8.2  HGB 12.5* 7.9*  8.8* 7.3*  HCT 35.0* 22.4* 24.7* 21.1*  PLT 210 185  --  116*   BMET Recent Labs    05/26/21 0609 05/27/21 0425 05/28/21 0600  NA 138 137 139  K 4.3 3.9 4.0  CL 109 110 111   CO2 20* 16* 21*  GLUCOSE 174* 160* 120*  BUN 27* 74* 37*  CREATININE 0.96 1.12 1.03  CALCIUM 8.9 8.7* 8.2*   LFT No results for input(s): PROT, ALBUMIN, AST, ALT, ALKPHOS, BILITOT, BILIDIR, IBILI in the last 72 hours. PT/INR No results for input(s): LABPROT, INR in the last 72 hours. Hepatitis Panel No results for input(s): HEPBSAG, HCVAB, HEPAIGM, HEPBIGM in the last 72 hours.   . CBC Latest Ref Rng & Units 05/28/2021 05/27/2021 05/27/2021  WBC 4.0 - 10.5 K/uL 8.2 - 14.9(H)  Hemoglobin 13.0 - 17.0 g/dL 7.3(L) 8.8(L) 7.9(L)  Hematocrit 39.0 - 52.0 % 21.1(L) 24.7(L) 22.4(L)  Platelets 150 - 400 K/uL 116(L) - 185    . CMP Latest Ref Rng & Units 05/28/2021 05/27/2021 05/26/2021  Glucose 70 - 99 mg/dL 120(H) 160(H) 174(H)  BUN 8 - 23 mg/dL 37(H) 74(H) 27(H)  Creatinine 0.61 - 1.24 mg/dL 1.03 1.12 0.96  Sodium 135 - 145 mmol/L 139 137 138  Potassium 3.5 - 5.1 mmol/L 4.0 3.9 4.3  Chloride 98 - 111 mmol/L 111 110 109  CO2 22 - 32 mmol/L 21(L) 16(L) 20(L)  Calcium 8.9 - 10.3 mg/dL 8.2(L) 8.7(L) 8.9  Total Protein 6.5 - 8.1 g/dL - - -  Total Bilirubin 0.3 - 1.2 mg/dL - - -  Alkaline Phos 38 - 126 U/L - - -  AST 15 - 41 U/L - - -  ALT 0 - 44 U/L - - -   Studies/Results: No results found.  Principal Problem:   Unstable angina ([place]) Active Problems:   Gastrointestinal hemorrhage associated with acute gastritis    Tye Savoy, NP-C @  05/28/2021, 11:27 AM

## 2021-05-28 NOTE — Plan of Care (Signed)
Pt is progressing towards goals

## 2021-05-28 NOTE — Progress Notes (Signed)
Progress Note  Patient Name: Travis Padilla Date of Encounter: 05/28/2021  Primary Cardiologist: [name], MD   Subjective   Pt was transferred to the CCU yesterday following his hypotensive episode. Was found to have a Hg drop from 12.5 to 7.9. Was give 1 unit of PRBC Post transfusion Hb was 8.8 Hb still pending today  Brilinta and ASA and low dose heparin are on hold currently  He looks and feels quite a bit better. Has not had another bowel movement.    Inpatient Medications    Scheduled Meds:  Chlorhexidine Gluconate Cloth  6 each Topical Daily   fluticasone  1 spray Each Nare Daily   umeclidinium bromide  1 puff Inhalation Daily   And   fluticasone furoate-vilanterol  1 puff Inhalation Daily   levothyroxine  150 mcg Oral Q0600   pantoprazole  40 mg Oral Daily   senna-docusate  1 tablet Oral BID   sodium chloride flush  3 mL Intravenous Q12H   sodium chloride flush  3 mL Intravenous Q12H   sodium chloride flush  3 mL Intravenous Q12H   sucralfate  1 g Oral TID WC & HS   tamsulosin  0.4 mg Oral QHS   Continuous Infusions:  sodium chloride Stopped (05/25/21 1610)   sodium chloride     sodium chloride 75 mL/hr at 05/28/21 0800   sodium chloride Stopped (05/26/21 1615)   PRN Meds: sodium chloride, sodium chloride, sodium chloride, acetaminophen, albuterol, ALPRAZolam, nitroGLYCERIN, ondansetron (ZOFRAN) IV, sodium chloride flush, sodium chloride flush, sodium chloride flush, zolpidem   Vital Signs    Vitals:   05/28/21 0600 05/28/21 0700 05/28/21 0750 05/28/21 0800  BP: (!) 80/66 124/80  131/80  Pulse:      Resp: (!) 21 18  19   Temp:   98.2 F (36.8 C)   TempSrc:   Oral   SpO2: 97% 99%  100%  Weight:      Height:        Intake/Output Summary (Last 24 hours) at 05/28/2021 0838 Last data filed at 05/28/2021 0800 Gross per 24 hour  Intake 2870 ml  Output 1075 ml  Net 1795 ml     Last 3 Weights 05/26/2021 05/25/2021 05/24/2021  Weight (lbs) 234 lb  1.6 oz 222 lb 235 lb 3.2 oz  Weight (kg) 106.187 kg 100.699 kg 106.686 kg      Telemetry  Sinus tach 108   ECG       Physical Exam   Physical Exam: Blood pressure 131/80, pulse (!) 107, temperature 98.2 F (36.8 C), temperature source Oral, resp. rate 19, height 6\' 2"  (1.88 m), weight 106.2 kg, SpO2 100 %.  GEN: elderly male, NAD .  Color is better than yesterday  HEENT: Normal NECK: No JVD; No carotid bruits LYMPHATICS: No lymphadenopathy CARDIAC: RRR , tachy  RESPIRATORY:  Clear to auscultation without rales, wheezing or rhonchi  ABDOMEN: Soft, non-tender, non-distended MUSCULOSKELETAL:  No edema; No deformity  SKIN: Warm and dry NEUROLOGIC:  Alert and oriented x 3   Labs    Chemistry Recent Labs  Lab 05/22/21 1951 05/24/21 1152 05/26/21 0609 05/27/21 0425 05/28/21 0600  NA 142   < > 138 137 139  K 4.9   < > 4.3 3.9 4.0  CL 109   < > 109 110 111  CO2 24   < > 20* 16* 21*  GLUCOSE 242*   < > 174* 160* 120*  BUN 27*   < > 27*  74* 37*  CREATININE 1.28*   < > 0.96 1.12 1.03  CALCIUM 9.7   < > 8.9 8.7* 8.2*  PROT 7.6  --   --   --   --   ALBUMIN 4.2  --   --   --   --   AST 18  --   --   --   --   ALT 14  --   --   --   --   ALKPHOS 58  --   --   --   --   BILITOT 0.5  --   --   --   --   GFRNONAA 59*  --  >60 >60 >60  ANIONGAP 9  --  9 11 7    < > = values in this interval not displayed.      Hematology Recent Labs  Lab 05/24/21 1152 05/26/21 0609 05/27/21 0425 05/27/21 1735  WBC 6.1 15.9* 14.9*  --   RBC 4.59 3.97* 2.52*  --   HGB 14.2 12.5* 7.9* 8.8*  HCT 41.7 35.0* 22.4* 24.7*  MCV 91 88.2 88.9  --   MCH 30.9 31.5 31.3  --   MCHC 34.1 35.7 35.3  --   RDW 12.9 12.0 12.3  --   PLT 150 210 185  --      Cardiac EnzymesNo results for input(s): TROPONINI in the last 168 hours. No results for input(s): TROPIPOC in the last 168 hours.   BNPNo results for input(s): BNP, PROBNP in the last 168 hours.   DDimer  Recent Labs  Lab 05/23/21 1135   DDIMER 0.38      Cardiac Studies   Cardiac Catheterization: 05/25/2021 Conclusions: Severe two-vessel coronary artery disease including 80% mid LAD stenosis, chronic total occlusion of moderate-caliber D1, and sequential 70-95% proximal-mid RCA lesions with severe calcification. Low-normal left ventricular systolic function (LVEF 09-38%) with normal filling pressure (LVEDP 5-10 mmHg). Successful PCI to mid LAD using Onyx Frontier 3.0 x 22 mm drug-eluting stent with 0% residual stenosis and TIMI-3 flow   Recommendations: Plan for staged PCI to proximal/mid RCA with atherectomy as soon as tomorrow if renal function allows. Continue dual antiplatelet therapy with aspirin and ticagrelor for at least 12 months. Aggressive secondary prevention.   Coronary Atherectomy: 05/26/2021   Prox RCA-1 lesion is 95% stenosed.   Prox RCA-2 lesion is 70% stenosed.   Mid RCA lesion is 80% stenosed.   A stent was successfully placed.   A stent was successfully placed.   Post intervention, there is a 0% residual stenosis.   Post intervention, there is a 0% residual stenosis.   Post intervention, there is a 0% residual stenosis.   1.  Successful stenting of high-grade calcified serial lesions of the proximal and mid right coronary artery with 2 overlapping drug-eluting stents with IVUS optimization.   Recommendation: The patient could should continue dual antiplatelet therapy for at least 6 months and aggressive cardiovascular risk factor modification.  Patient Profile     75 y.o. male 2/ PMH of CAD (mild nonobstructive CAD by cath in 2013), HTN, HLD, small cell lung cancer (s/p chemo and radiation) and OSA who presented to [facility] on 05/25/2021 for a planned cardiac catheterization.   Assessment & Plan    1. CAD/Unstable Angina - The patient was evaluated at [facility] ED on 05/23/2021 for chest pain and Hs troponin values were negative. He did follow-up with Dr. [name] and a catheterization  was recommended for definitive evaluation.  -  Initial cardiac catheterization showed 2-vessel CAD with 80% mid-LAD stenosis, CTO of D1 and sequential 70-95% proximal-mid RCA lesions and underwent PCI to the mid-LAD with DESx1. Was recommended to have staged PCI of the RCA and this was performed on 05/26/2021 with DESx2 to the proximal and mid RCA.    Because of his GI bleed and hypotensive episode yesterday, the ASA and Brilinta have been held Labs are pending this am  If his Hb has stabilized, will anticipate restarting Brilinta or Plavix - will likely not restart ASA at this time   2. HTN BP is currently stable   3. HLD - LDL at 54 when checked earlier this month. On Praluent as an outpatient.   4. COPD/OSA Had several apneic episodes overnight    5. Anemia - Hb pending . Will restart Brilinta or Plavix once his Hb has stabilized. Will need a GI work up at some point    Mertie Moores, MD  05/28/2021 [address],  [place], [place]  [address] Phone: [phone]; Fax: [phone]

## 2021-05-28 NOTE — Consult Note (Signed)
Referring Provider:  [facility]         Primary Care Physician:  [name], DO Primary Gastroenterologist:  Dr. Laural Golden Reason for Consultation:   GI bleed                ASSESSMENT / PLAN   # 75 yo male with melena and acute drop in hgb on Brillinta and ASA. Rule out PUD --Post transfusion ( 1 unit PRBC) hgb 8.8 but declined to 7.9. Getting another [facility] now.  --Brillinta and ASA on hold.  --Patient had breakfast today so unable to proceed with an EGD. Will scheduled EGD to be done tomorrow. The risks and benefits of EGD with possible biopsies were discussed with the patient who agrees to proceed.  --No further bleeding today.  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 Not currently on therapy  # OSA on CPAP  # Chronic constipation. Manages with Miralax, Dulcolax and prunes  # See below for additional medical problems  HISTORY OF PRESENT ILLNESS                                                                                                                         Chief Complaint: GI bleed  Travis Padilla is a 75 y.o. male with a past medical history significant for stage IV small cell lung cancer, OSA, HTN, HLD, gout, hypothyroidism, obesitySee PMH for any additional medical history.   Patient admitted  with unstable angina. He underwent cardiac cath on 9/14. He had a PCI to mid LAD which was to be followed by a staged atherectomy / PCI to RCA. On 9/16 he DES x 2 to proximal and mid RCA. He is now on [place].  Patient transferred to CCU yesterday for hypotension and hgb drog from 12.7 to 7.9. Hgb improved but declined again. He hasn't had any BMs / bleeding today. Patient has chronic constipation but no other GI complaints. No routine use of NSAIDs though recently took 2 full ASA ( twice) for chest pain. He hasn't seen Dr. Laural Golden in years. He takes  Protonix as needed for reflux   SIGNIFICANT DIAGNOTIC STUDIES    04/21/21 CT chest / abd / pelvis for lung cancer restating IMPRESSION: 1. Stable (over the past several CT scans) lobulated left upper lobe lung mass. 2. Stable radiation changes involving the right hilum and paramediastinal lung. No findings suspicious for recurrent tumor. 3. No mediastinal or hilar mass or adenopathy. 4. Stable 17 mm left axillary soft tissue lesion. 5. No findings for abdominal/pelvic metastatic disease. 6. Stable bilateral renal calculi. 7. Stable advanced atherosclerotic calcifications involving the thoracic and abdominal aorta and branch vessels including the coronary arteries. 8. Aortic atherosclerosis.      PREVIOUS ENDOSCOPIC EVALUATIONS   Feb 2016 screening colonoscopy. Complete exam with excellent prep. 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) ([place]) dx'd 04/17/18   to LN, infrahilar mass, lung nodule and lt axilla   Obesity, unspecified 03/28/2009   Qualifier: Diagnosis of  By: [name], MD, [name]     Palpitations 03/28/2009   Qualifier: Diagnosis of  By: [name], MD, [name]     Right lower lobe lung mass 04/22/2018   Skin cancer    Sleep apnea    SNORING 03/28/2009   Qualifier: Diagnosis of  By: [name], MD, [name]     Spinal stenosis    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[place]) 05/13/2018    Past Surgical History:   Procedure Laterality Date   basal skin cancer N/A 2019   Nose   BELPHAROPTOSIS REPAIR Bilateral    CARDIAC CATHETERIZATION     CATARACT EXTRACTION W/ INTRAOCULAR LENS  IMPLANT, BILATERAL     COLONOSCOPY N/A 11/03/2014   Procedure: COLONOSCOPY;  Surgeon: Rogene Houston, MD;  [facility];  Service: Endoscopy;  Laterality: N/A;  1225   CORONARY ATHERECTOMY N/A 05/26/2021   Procedure: CORONARY ATHERECTOMY;  Surgeon: Early Osmond, MD;  Location: [place] [facility];  Service: Cardiovascular;  Laterality: N/A;   CORONARY STENT INTERVENTION N/A 05/25/2021   Procedure: CORONARY STENT INTERVENTION;  Surgeon: Nelva Bush, MD;  Location: [place] [facility];  Service: Cardiovascular;  Laterality: N/A;   CORONARY STENT INTERVENTION N/A 05/26/2021   Procedure: CORONARY STENT INTERVENTION;  Surgeon: Early Osmond, MD;  Location: [place] [facility];  Service: Cardiovascular;  Laterality: N/A;   INTRAVASCULAR ULTRASOUND/IVUS N/A 05/26/2021   Procedure: Intravascular Ultrasound/IVUS;  Surgeon: Early Osmond, MD;  Location: [place] [facility];  Service: Cardiovascular;  Laterality: N/A;   IR CV LINE INJECTION  08/24/2020   IR FLUORO GUIDED NEEDLE PLC ASPIRATION/INJECTION LOC  11/28/2020   IR IMAGING GUIDED PORT INSERTION  07/11/2018   KNEE CARTILAGE SURGERY Left    Left knee   LEFT HEART CATH AND CORONARY ANGIOGRAPHY N/A 05/25/2021   Procedure: LEFT HEART CATH AND CORONARY ANGIOGRAPHY;  Surgeon: Nelva Bush, MD;  Location: [place] [facility];  Service: Cardiovascular;  Laterality: N/A;   SKIN CANCER EXCISION  12/2014, 04/25/15   TEMPORARY PACEMAKER N/A 05/26/2021   Procedure: TEMPORARY PACEMAKER;  Surgeon: Early Osmond, MD;  Location: [place] [facility];  Service: Cardiovascular;  Laterality: N/A;   VIDEO BRONCHOSCOPY Bilateral 05/09/2018   Procedure: VIDEO BRONCHOSCOPY WITH FLUORO;  Surgeon: Juanito Doom, MD;  Location: [facility];  Service: Cardiopulmonary;   Laterality: Bilateral;    Prior to Admission medications   Medication Sig Start Date End Date Taking? Authorizing Provider  acetaminophen (TYLENOL) 500 MG tablet Take 1,000 mg by mouth every 6 (six) hours as needed.   Yes [provider]  albuterol (VENTOLIN HFA) 108 (90 Base) MCG/ACT inhaler Inhale 2 puffs into the lungs every 6 (six) hours as needed for  wheezing or shortness of breath. 04/04/21  Yes Icard, Octavio Graves, DO  ALPRAZolam (XANAX) 0.25 MG tablet Take 1 tablet (0.25 mg total) by mouth at bedtime as needed for anxiety. 10/19/20  Yes Curt Bears, MD  Artificial Tear Solution (SOOTHE XP OP) Place 1 drop into both eyes 2 (two) times daily.   Yes [provider]  carvedilol (COREG) 12.5 MG tablet Take 1 tablet (12.5 mg total) by mouth in the morning, at noon, and at bedtime. 12/21/20  Yes [name], MD  diphenhydrAMINE (BENADRYL) 50 MG tablet Take 1 tablet (50 mg total) by mouth as directed. Take 1 tablet one hour prior to Ct scans 01/19/21  Yes Curt Bears, MD  fluticasone [facility]) 50 MCG/ACT nasal spray Place 1 spray into both nostrils daily. 07/01/20  Yes Lauraine Rinne, NP  Fluticasone-Umeclidin-Vilant (TRELEGY ELLIPTA) 100-62.5-25 MCG/INH AEPB Inhale 1 puff into the lungs daily. 03/20/21  Yes Icard, Octavio Graves, DO  levothyroxine (SYNTHROID) 150 MCG tablet Take 1 tablet (150 mcg total) by mouth daily before breakfast. 05/01/21  Yes Heilingoetter, Cassandra L, PA-C  polyethylene glycol powder (GLYCOLAX/MIRALAX) 17 GM/SCOOP powder Take 1 Container by mouth once.   Yes [provider]  PRALUENT 150 MG/ML SOAJ INJECT 150 MG INTO THE SKIN EVERY 14 (FOURTEEN) DAYS. 04/24/21  Yes [name], MD  predniSONE (DELTASONE) 50 MG tablet Take 1 tablet (50 mg total) by mouth as directed. Take one tablet 13 hours , one tablet 7 hours and one tablet  1 hour prior to scan 01/19/21  Yes Curt Bears, MD  predniSONE (DELTASONE) 50 MG tablet Pt to take 50 mg of  prednisone on 05/22/21 at 1230 am, 50 mg of prednisone on 05/22/21 at 6:30 am, and 50 mg of prednisone on 05/22/21 at 1230 pm. Pt is also to take 50 mg of benadryl on 05/22/21 at 1230 pm. Please call [phone] with any questions. 05/03/21  Yes San Morelle, MD  predniSONE (DELTASONE) 50 MG tablet Take 1 tablet 13 hours before CATH, take 1 tablet 7 hours before CATH, and 1 tablet before leaving home. 05/24/21  Yes [name], MD  senna-docusate (SENNA S) 8.6-50 MG tablet 1 to 2 twice daily for constipation 07/07/18  Yes Tanner, Lyndon Code., PA-C  tamsulosin (FLOMAX) 0.4 MG CAPS capsule Take 0.4 mg by mouth at bedtime. 09/29/20  Yes [provider]  methocarbamol (ROBAXIN) 500 MG tablet Take 500 mg by mouth daily. 08/11/19   [provider]  pantoprazole (PROTONIX) 40 MG tablet TAKE 1 TABLET (40 MG TOTAL) BY MOUTH DAILY FOR 2 WEEKS PER FLAREUP OF REFLUX 01/30/21   Ronnie Doss M, DO  Rimegepant Sulfate (NURTEC) 75 MG TBDP Dissolve 1 tablet in mouth at onset of migraine ONCE daily if needed. 01/17/21   [name], DO  ticagrelor (BRILINTA) 90 MG TABS tablet Take 1 tablet (90 mg total) by mouth 2 (two) times daily. 05/27/21   Erma Heritage, PA-C    Current Facility-Administered Medications  Medication Dose Route Frequency Provider Last Rate Last Admin   0.9 %  sodium chloride infusion  250 mL Intravenous PRN Ahmed Prima, Fransisco Hertz, PA-C   Stopped at 05/25/21 1610   0.9 %  sodium chloride infusion  250 mL Intravenous PRN Ahmed Prima, [place] M, PA-C       0.9 %  sodium chloride infusion  250 mL Intravenous PRN Erma Heritage, PA-C   Stopped at 05/26/21 1615   acetaminophen (TYLENOL) tablet 650 mg  650 mg Oral Q4H  PRN Erma Heritage, PA-C   650 mg at 05/28/21 1017   albuterol (PROVENTIL) (2.5 MG/3ML) 0.083% nebulizer solution 3 mL  3 mL Inhalation Q6H PRN Ahmed Prima, [place] M, PA-C       ALPRAZolam Duanne Moron) tablet 0.25 mg  0.25 mg Oral QHS PRN Ahmed Prima, Brittany M,  PA-C   0.25 mg at 05/27/21 2142   Chlorhexidine Gluconate Cloth 2 % PADS 6 each  6 each Topical Daily End, Christopher, MD   6 each at 05/28/21 1019   fluticasone (FLONASE) 50 MCG/ACT nasal spray 1 spray  1 spray Each Nare Daily Strader, Brittany M, PA-C   1 spray at 05/26/21 0932   umeclidinium bromide (INCRUSE ELLIPTA) 62.5 MCG/INH 1 puff  1 puff Inhalation Daily Bernerd Pho M, PA-C   1 puff at 05/27/21 0850   And   fluticasone furoate-vilanterol (BREO ELLIPTA) 100-25 MCG/INH 1 puff  1 puff Inhalation Daily Erma Heritage, PA-C   1 puff at 05/27/21 0850   levothyroxine (SYNTHROID) tablet 150 mcg  150 mcg Oral Q0600 Erma Heritage, PA-C   150 mcg at 05/28/21 0603   nitroGLYCERIN (NITROSTAT) SL tablet 0.4 mg  0.4 mg Sublingual Q5 min PRN Bernerd Pho M, PA-C   0.4 mg at 05/25/21 1207   ondansetron (ZOFRAN) injection 4 mg  4 mg Intravenous Q6H PRN Ahmed Prima, Brittany M, PA-C       pantoprazole (PROTONIX) injection 40 mg  40 mg Intravenous Q12H Nahser, Wonda Cheng, MD   40 mg at 05/28/21 1017   senna-docusate (Senokot-S) tablet 1 tablet  1 tablet Oral BID Erma Heritage, PA-C   1 tablet at 05/26/21 8657   sodium chloride flush (NS) 0.9 % injection 3 mL  3 mL Intravenous Q12H Bernerd Pho M, PA-C   3 mL at 05/28/21 1019   sodium chloride flush (NS) 0.9 % injection 3 mL  3 mL Intravenous PRN Ahmed Prima, Brittany M, PA-C       sodium chloride flush (NS) 0.9 % injection 3 mL  3 mL Intravenous Q12H Strader, Brittany M, PA-C   3 mL at 05/28/21 1017   sodium chloride flush (NS) 0.9 % injection 3 mL  3 mL Intravenous PRN Ahmed Prima, Brittany M, PA-C       sodium chloride flush (NS) 0.9 % injection 3 mL  3 mL Intravenous Q12H Strader, [place] M, PA-C   3 mL at 05/28/21 1018   sodium chloride flush (NS) 0.9 % injection 3 mL  3 mL Intravenous PRN Ahmed Prima, [place] M, PA-C       sucralfate (CARAFATE) tablet 1 g  1 g Oral TID WC & HS Strader, [place] M, PA-C   1 g at 05/28/21 1017    tamsulosin (FLOMAX) capsule 0.4 mg  0.4 mg Oral QHS Strader, [place] M, PA-C   0.4 mg at 05/27/21 2142   zolpidem (AMBIEN) tablet 5 mg  5 mg Oral QHS PRN Erma Heritage, PA-C   5 mg at 05/27/21 2141    Allergies as of 05/24/2021 - Review Complete 05/24/2021  Allergen Reaction Noted   Pravastatin  04/07/2020   Iodine Hives and Rash 08/16/2020    Family History  Problem Relation Age of Onset   Heart attack Father    Heart failure Mother    Parkinson's disease Mother    Healthy Sister    Skin cancer Sister    Neuropathy Brother    COPD Brother    Epilepsy Brother    Healthy Sister  Healthy Sister    Colon cancer Neg Hx     Social History   Socioeconomic History   Marital status: Married    Spouse name: Not on file   Number of children: 1   Years of education: Not on file   Highest education level: Not on file  Occupational History   Occupation: Government social research officer  Tobacco Use   Smoking status: Former    Packs/day: 0.20    Years: 2.00    Pack years: 0.40    Types: Cigarettes    Start date: 10/01/1968    Quit date: 1972    Years since quitting: 50.7   Smokeless tobacco: Never  Vaping Use   Vaping Use: Never used  Substance and Sexual Activity   Alcohol use: No    Comment: hx of    Drug use: No   Sexual activity: Not on file  Other Topics Concern   Not on file  Social History Narrative   Unable to ask intimate partner violence questions, wife present   Social Determinants of Health   Financial Resource Strain: Not on file  Food Insecurity: Not on file  Transportation Needs: Not on file  Physical Activity: Not on file  Stress: Not on file  Social Connections: Not on file  Intimate Partner Violence: Not on file    Review of Systems: All systems reviewed and negative except where noted in HPI.  OBJECTIVE    Physical Exam: Vital signs in last 24 hours: Temp:  [97.9 F (36.6 C)-98.7 F (37.1 C)] 98.7 F (37.1 C) (09/18 1011) Pulse Rate:   [53-117] 107 (09/17 1700) Resp:  [14-25] 14 (09/18 1011) BP: (80-133)/(39-92) 102/72 (09/18 1011) SpO2:  [97 %-100 %] 98 % (09/18 1011) Last BM Date: 05/27/21 General:   Alert  male in NAD Psych:  Pleasant, cooperative. Normal mood and affect. Eyes:  Pupils equal, sclera clear, no icterus.   Conjunctiva pink. Ears:  Normal auditory acuity. Nose:  No deformity, discharge,  or lesions. Neck:  Supple; no masses Lungs:  Clear throughout to auscultation.   No wheezes, crackles, or rhonchi.  Heart:  Regular rate and rhythm;  no lower extremity edema Abdomen:  Soft, non-distended, mild LLQ tenderness.  BS active, no palp mass   Rectal:  Deferred  Msk:  Symmetrical without gross deformities. . Neurologic:  Alert and  oriented x4;  grossly normal neurologically. Skin:  Intact without significant lesions or rashes.   Scheduled inpatient medications  Chlorhexidine Gluconate Cloth  6 each Topical Daily   fluticasone  1 spray Each Nare Daily   umeclidinium bromide  1 puff Inhalation Daily   And   fluticasone furoate-vilanterol  1 puff Inhalation Daily   levothyroxine  150 mcg Oral Q0600   pantoprazole (PROTONIX) IV  40 mg Intravenous Q12H   senna-docusate  1 tablet Oral BID   sodium chloride flush  3 mL Intravenous Q12H   sodium chloride flush  3 mL Intravenous Q12H   sodium chloride flush  3 mL Intravenous Q12H   sucralfate  1 g Oral TID WC & HS   tamsulosin  0.4 mg Oral QHS      Intake/Output from previous day: 09/17 0701 - 09/18 0700 In: 940 [I.V.:500; Blood:440] Out: 7782 [Urine:1075] Intake/Output this shift: Total I/O In: 2107.3 [P.O.:120; I.V.:1987.3] Out: -    Lab Results: Recent Labs    05/26/21 0609 05/27/21 0425 05/27/21 1735 05/28/21 0600  WBC 15.9* 14.9*  --  8.2  HGB 12.5* 7.9*  8.8* 7.3*  HCT 35.0* 22.4* 24.7* 21.1*  PLT 210 185  --  116*   BMET Recent Labs    05/26/21 0609 05/27/21 0425 05/28/21 0600  NA 138 137 139  K 4.3 3.9 4.0  CL 109 110 111   CO2 20* 16* 21*  GLUCOSE 174* 160* 120*  BUN 27* 74* 37*  CREATININE 0.96 1.12 1.03  CALCIUM 8.9 8.7* 8.2*   LFT No results for input(s): PROT, ALBUMIN, AST, ALT, ALKPHOS, BILITOT, BILIDIR, IBILI in the last 72 hours. PT/INR No results for input(s): LABPROT, INR in the last 72 hours. Hepatitis Panel No results for input(s): HEPBSAG, HCVAB, HEPAIGM, HEPBIGM in the last 72 hours.   . CBC Latest Ref Rng & Units 05/28/2021 05/27/2021 05/27/2021  WBC 4.0 - 10.5 K/uL 8.2 - 14.9(H)  Hemoglobin 13.0 - 17.0 g/dL 7.3(L) 8.8(L) 7.9(L)  Hematocrit 39.0 - 52.0 % 21.1(L) 24.7(L) 22.4(L)  Platelets 150 - 400 K/uL 116(L) - 185    . CMP Latest Ref Rng & Units 05/28/2021 05/27/2021 05/26/2021  Glucose 70 - 99 mg/dL 120(H) 160(H) 174(H)  BUN 8 - 23 mg/dL 37(H) 74(H) 27(H)  Creatinine 0.61 - 1.24 mg/dL 1.03 1.12 0.96  Sodium 135 - 145 mmol/L 139 137 138  Potassium 3.5 - 5.1 mmol/L 4.0 3.9 4.3  Chloride 98 - 111 mmol/L 111 110 109  CO2 22 - 32 mmol/L 21(L) 16(L) 20(L)  Calcium 8.9 - 10.3 mg/dL 8.2(L) 8.7(L) 8.9  Total Protein 6.5 - 8.1 g/dL - - -  Total Bilirubin 0.3 - 1.2 mg/dL - - -  Alkaline Phos 38 - 126 U/L - - -  AST 15 - 41 U/L - - -  ALT 0 - 44 U/L - - -   Studies/Results: No results found.  Principal Problem:   Unstable angina ([place]) Active Problems:   Gastrointestinal hemorrhage associated with acute gastritis    Tye Savoy, NP-C @  05/28/2021, 11:27 AM

## 2021-05-29 ENCOUNTER — Encounter (HOSPITAL_COMMUNITY): Payer: Self-pay | Admitting: Internal Medicine

## 2021-05-29 ENCOUNTER — Inpatient Hospital Stay (HOSPITAL_COMMUNITY): Payer: Medicare Other | Admitting: Certified Registered"

## 2021-05-29 ENCOUNTER — Encounter (HOSPITAL_COMMUNITY)
Admission: AD | Disposition: A | Discharge status: Home / Self Care | Payer: Self-pay | Source: Home / Self Care | Attending: Internal Medicine

## 2021-05-29 DIAGNOSIS — K2901 Acute gastritis with bleeding: Secondary | ICD-10-CM | POA: Diagnosis not present

## 2021-05-29 DIAGNOSIS — E782 Mixed hyperlipidemia: Secondary | ICD-10-CM

## 2021-05-29 DIAGNOSIS — I25110 Atherosclerotic heart disease of native coronary artery with unstable angina pectoris: Principal | ICD-10-CM

## 2021-05-29 HISTORY — PX: ESOPHAGOGASTRODUODENOSCOPY (EGD) WITH PROPOFOL: SHX5813

## 2021-05-29 LAB — BPAM RBC
Blood Product Expiration Date: 202210172359
Blood Product Expiration Date: 202210172359
Blood Product Expiration Date: 202210172359
ISSUE DATE / TIME: 202209171127
ISSUE DATE / TIME: 202209180935
ISSUE DATE / TIME: 202209181633
Unit Type and Rh: 5100
Unit Type and Rh: 5100
Unit Type and Rh: 5100

## 2021-05-29 LAB — TYPE AND SCREEN
ABO/RH(D): O POS
Antibody Screen: NEGATIVE
Unit division: 0
Unit division: 0
Unit division: 0

## 2021-05-29 LAB — CBC WITH DIFFERENTIAL/PLATELET
Abs Immature Granulocytes: 0.28 10*3/uL — ABNORMAL HIGH (ref 0.00–0.07)
Basophils Absolute: 0 10*3/uL (ref 0.0–0.1)
Basophils Relative: 0 %
Eosinophils Absolute: 0.2 10*3/uL (ref 0.0–0.5)
Eosinophils Relative: 2 %
HCT: 25 % — ABNORMAL LOW (ref 39.0–52.0)
Hemoglobin: 8.8 g/dL — ABNORMAL LOW (ref 13.0–17.0)
Immature Granulocytes: 3 %
Lymphocytes Relative: 15 %
Lymphs Abs: 1.3 10*3/uL (ref 0.7–4.0)
MCH: 30.8 pg (ref 26.0–34.0)
MCHC: 35.2 g/dL (ref 30.0–36.0)
MCV: 87.4 fL (ref 80.0–100.0)
Monocytes Absolute: 0.9 10*3/uL (ref 0.1–1.0)
Monocytes Relative: 11 %
Neutro Abs: 6.1 10*3/uL (ref 1.7–7.7)
Neutrophils Relative %: 69 %
Platelets: 136 10*3/uL — ABNORMAL LOW (ref 150–400)
RBC: 2.86 MIL/uL — ABNORMAL LOW (ref 4.22–5.81)
RDW: 14.5 % (ref 11.5–15.5)
WBC: 8.8 10*3/uL (ref 4.0–10.5)
nRBC: 0 % (ref 0.0–0.2)

## 2021-05-29 LAB — BASIC METABOLIC PANEL
Anion gap: 7 (ref 5–15)
BUN: 16 mg/dL (ref 8–23)
CO2: 24 mmol/L (ref 22–32)
Calcium: 8.1 mg/dL — ABNORMAL LOW (ref 8.9–10.3)
Chloride: 106 mmol/L (ref 98–111)
Creatinine, Ser: 1.02 mg/dL (ref 0.61–1.24)
GFR, Estimated: >60 mL/min (ref >60)
Glucose, Bld: 118 mg/dL — ABNORMAL HIGH (ref 70–99)
Potassium: 3.8 mmol/L (ref 3.5–5.1)
Sodium: 137 mmol/L (ref 135–145)

## 2021-05-29 LAB — CBC
HCT: 28 % — ABNORMAL LOW (ref 39.0–52.0)
Hemoglobin: 9.7 g/dL — ABNORMAL LOW (ref 13.0–17.0)
MCH: 30.5 pg (ref 26.0–34.0)
MCHC: 34.6 g/dL (ref 30.0–36.0)
MCV: 88.1 fL (ref 80.0–100.0)
Platelets: 162 10*3/uL (ref 150–400)
RBC: 3.18 MIL/uL — ABNORMAL LOW (ref 4.22–5.81)
RDW: 14.6 % (ref 11.5–15.5)
WBC: 10.7 10*3/uL — ABNORMAL HIGH (ref 4.0–10.5)
nRBC: 0 % (ref 0.0–0.2)

## 2021-05-29 LAB — MAGNESIUM: Magnesium: 1.7 mg/dL (ref 1.7–2.4)

## 2021-05-29 SURGERY — ESOPHAGOGASTRODUODENOSCOPY (EGD) WITH PROPOFOL
Anesthesia: Monitor Anesthesia Care

## 2021-05-29 MED ORDER — LIDOCAINE 2% (20 MG/ML) 5 ML SYRINGE
INTRAMUSCULAR | Status: DC | PRN
  Administered 2021-05-29: 150 mg via INTRAVENOUS

## 2021-05-29 MED ORDER — PANTOPRAZOLE SODIUM 40 MG PO TBEC
40.0000 mg | DELAYED_RELEASE_TABLET | Freq: Two times a day (BID) | ORAL | Status: DC
  Administered 2021-05-29 – 2021-05-31 (×4): 40 mg via ORAL
  Filled 2021-05-29 (×4): qty 1

## 2021-05-29 MED ORDER — GLYCOPYRROLATE PF 0.2 MG/ML IJ SOSY
PREFILLED_SYRINGE | INTRAMUSCULAR | Status: DC | PRN
Start: 2021-05-29 — End: 2021-05-29
  Administered 2021-05-29: .2 mg via INTRAVENOUS

## 2021-05-29 MED ORDER — MAGNESIUM SULFATE 2 GM/50ML IV SOLN
2.0000 g | Freq: Once | INTRAVENOUS | Status: AC
  Administered 2021-05-29: 2 g via INTRAVENOUS
  Filled 2021-05-29: qty 50

## 2021-05-29 MED ORDER — CLOPIDOGREL BISULFATE 75 MG PO TABS
75.0000 mg | ORAL_TABLET | Freq: Every day | ORAL | Status: DC
  Administered 2021-05-30 – 2021-05-31 (×2): 75 mg via ORAL
  Filled 2021-05-29 (×2): qty 1

## 2021-05-29 MED ORDER — LACTATED RINGERS IV SOLN
INTRAVENOUS | Status: AC | PRN
  Administered 2021-05-29: 10 mL/h via INTRAVENOUS

## 2021-05-29 MED ORDER — PROPOFOL 10 MG/ML IV BOLUS
INTRAVENOUS | Status: DC | PRN
  Administered 2021-05-29: 80 mg via INTRAVENOUS

## 2021-05-29 MED ORDER — PROPOFOL 500 MG/50ML IV EMUL
INTRAVENOUS | Status: DC | PRN
  Administered 2021-05-29: 200 ug/kg/min via INTRAVENOUS

## 2021-05-29 MED ORDER — LACTATED RINGERS IV SOLN: INTRAVENOUS | Status: DC | PRN

## 2021-05-29 MED ORDER — CLOPIDOGREL BISULFATE 300 MG PO TABS
300.0000 mg | ORAL_TABLET | Freq: Once | ORAL | Status: AC
  Administered 2021-05-29: 300 mg via ORAL
  Filled 2021-05-29: qty 1

## 2021-05-29 SURGICAL SUPPLY — 15 items

## 2021-05-29 NOTE — Interval H&P Note (Signed)
History and Physical Interval Note:  05/29/2021 9:29 AM  Travis Padilla  has presented today for surgery, with the diagnosis of melena, anemia.  The various methods of treatment have been discussed with the patient and family. After consideration of risks, benefits and other options for treatment, the patient has consented to  Procedure(s): ESOPHAGOGASTRODUODENOSCOPY (EGD) WITH PROPOFOL (N/A) as a surgical intervention.  The patient's history has been reviewed, patient examined, no change in status, stable for surgery.  I have reviewed the patient's chart and labs.  Questions were answered to the patient's satisfaction.     Silvano Rusk

## 2021-05-29 NOTE — Op Note (Addendum)
[facility] Patient Name: Travis Padilla Procedure Date : 05/29/2021 MRN: [id number] Attending MD: Gatha Mayer , MD Date of Birth: 11-Feb-1946 CSN: [id number] Age: 75 Admit Type: Inpatient Procedure:                Upper GI endoscopy Indications:              Melena Providers:                Gatha Mayer, MD, Mariana Arn, Faustina                            [place], Technician Referring MD:              Medicines:                Propofol per Anesthesia, Monitored Anesthesia Care Complications:            No immediate complications. Estimated Blood Loss:     Estimated blood loss: none. Procedure:                Pre-Anesthesia Assessment:                           - Prior to the procedure, a History and Physical                            was performed, and patient medications and                            allergies were reviewed. The patient's tolerance of                            previous anesthesia was also reviewed. The risks                            and benefits of the procedure and the sedation                            options and risks were discussed with the patient.                            All questions were answered, and informed consent                            was obtained. Prior Anticoagulants: The patient                            last took antiplatelet medication on the day of the                            procedure. ASA Grade Assessment: III - A patient                            with severe systemic disease. After reviewing the  risks and benefits, the patient was deemed in                            satisfactory condition to undergo the procedure.                           After obtaining informed consent, the endoscope was                            passed under direct vision. Throughout the                            procedure, the patient's blood pressure, pulse, and                            oxygen  saturations were monitored continuously. The                            [id number]-[id number] ([id number] Olympus endoscope was introduced                            through the mouth, and advanced to the second part                            of duodenum. The upper GI endoscopy was                            accomplished without difficulty. The patient                            tolerated the procedure well. Scope In: Scope Out: Findings:      Multiple localized 3 mm erosions with no stigmata of recent bleeding       were found in the prepyloric region of the stomach.      The exam was otherwise without abnormality.      The cardia and gastric fundus were normal on retroflexion. Impression:               - Erosive gastropathy with no stigmata of recent                            bleeding. But I think this was source of melena in                            setting of anti-coag and anti-PLT                           - The examination was otherwise normal.                           - No specimens collected. Recommendation:           - Return patient to ICU for ongoing care.                           -  Continue bid PPI - - change to oral                           OK to stay on cagrelor or other anti-PLT                           Stool H pylori Ag and serology ordered                           F/U CBC                           I updated wife by phone Procedure Code(s):        --- Professional ---                           [phone], Esophagogastroduodenoscopy, flexible,                            transoral; diagnostic, including collection of                            specimen(s) by brushing or washing, when performed                            (separate procedure) Diagnosis Code(s):        --- Professional ---                           K31.89, Other diseases of stomach and duodenum                           K92.1, Melena (includes Hematochezia) CPT copyright 2019 [employer]. All rights  reserved. The codes documented in this report are preliminary and upon coder review may  be revised to meet current compliance requirements. Gatha Mayer, MD 05/29/2021 10:18:41 AM This report has been signed electronically. Number of Addenda: 0

## 2021-05-29 NOTE — Plan of Care (Signed)
Pt is progressing towards goals

## 2021-05-29 NOTE — Progress Notes (Signed)
Progress Note  Patient Name: Travis Padilla Date of Encounter: 05/29/2021  [facility] Cardiologist: [name], MD   Subjective   Status post multivessel PCI last week.  Over the weekend, he had dark stools with hemoglobin drop.  On 917, there is a brief episode of unresponsiveness per the chart.  Plan was for IV Cangrelor until EGD could be done.  Inpatient Medications    Scheduled Meds:  Chlorhexidine Gluconate Cloth  6 each Topical Daily   fluticasone  1 spray Each Nare Daily   umeclidinium bromide  1 puff Inhalation Daily   And   fluticasone furoate-vilanterol  1 puff Inhalation Daily   levothyroxine  150 mcg Oral Q0600   pantoprazole (PROTONIX) IV  40 mg Intravenous Q12H   senna-docusate  1 tablet Oral BID   sodium chloride flush  3 mL Intravenous Q12H   sodium chloride flush  3 mL Intravenous Q12H   sodium chloride flush  3 mL Intravenous Q12H   sucralfate  1 g Oral TID WC & HS   tamsulosin  0.4 mg Oral QHS   Continuous Infusions:  sodium chloride Stopped (05/25/21 1610)   sodium chloride     sodium chloride Stopped (05/26/21 1615)   cangrelor 50 mg in NS 250 mL 0.75 mcg/kg/min (05/29/21 0021)   PRN Meds: sodium chloride, sodium chloride, sodium chloride, acetaminophen, albuterol, ALPRAZolam, nitroGLYCERIN, ondansetron (ZOFRAN) IV, sodium chloride flush, sodium chloride flush, sodium chloride flush, zolpidem   Vital Signs    Vitals:   05/29/21 0400 05/29/21 0500 05/29/21 0600 05/29/21 0700  BP: 102/65 138/80 (!) 101/59 107/72  Pulse:      Resp: 17 17 (!) 21 18  Temp: 97.9 F (36.6 C)     TempSrc: Oral     SpO2: 97% 99% 95% 98%  Weight:      Height:        Intake/Output Summary (Last 24 hours) at 05/29/2021 0850 Last data filed at 05/29/2021 0000 Gross per 24 hour  Intake 1270.37 ml  Output 1900 ml  Net -629.63 ml   Last 3 Weights 05/26/2021 05/25/2021 05/24/2021  Weight (lbs) 234 lb 1.6 oz 222 lb 235 lb 3.2 oz  Weight (kg) 106.187 kg 100.699 kg  106.686 kg      Telemetry    NSR - Personally Reviewed  ECG     - Personally Reviewed  Physical Exam   GEN: No acute distress.   Neck: No JVD Cardiac: RRR, no murmurs, rubs, or gallops.  Respiratory: Clear to auscultation bilaterally. GI: Soft, nontender, non-distended  MS: No edema; No deformity. Right radial site without hematoma Neuro:  Nonfocal  Psych: Normal affect   Labs    High Sensitivity Troponin:   Recent Labs  Lab 05/22/21 1951 05/23/21 1135  TROPONINIHS 10 16     Chemistry Recent Labs  Lab 05/22/21 1951 05/24/21 1152 05/27/21 0425 05/28/21 0600 05/29/21 0642  NA 142   < > 137 139 137  K 4.9   < > 3.9 4.0 3.8  CL 109   < > 110 111 106  CO2 24   < > 16* 21* 24  GLUCOSE 242*   < > 160* 120* 118*  BUN 27*   < > 74* 37* 16  CREATININE 1.28*   < > 1.12 1.03 1.02  CALCIUM 9.7   < > 8.7* 8.2* 8.1*  MG  --   --   --  1.7 1.7  PROT 7.6  --   --   --   --  ALBUMIN 4.2  --   --   --   --   AST 18  --   --   --   --   ALT 14  --   --   --   --   ALKPHOS 58  --   --   --   --   BILITOT 0.5  --   --   --   --   GFRNONAA 59*   < > >60 >60 >60  ANIONGAP 9   < > 11 7 7    < > = values in this interval not displayed.    Lipids  Recent Labs  Lab 05/24/21 1151  CHOL 123  TRIG 191*  HDL 38*  LABVLDL 31  LDLCALC 54  CHOLHDL 3.2    Hematology Recent Labs  Lab 05/28/21 0600 05/28/21 1433 05/28/21 2042 05/29/21 0642  WBC 8.2  --  10.3 8.8  RBC 2.31*  --  2.95* 2.86*  HGB 7.3* 8.0* 8.9* 8.8*  HCT 21.1* 22.9* 25.6* 25.0*  MCV 91.3  --  86.8 87.4  MCH 31.6  --  30.2 30.8  MCHC 34.6  --  34.8 35.2  RDW 13.2  --  14.4 14.5  PLT 116*  --  144* 136*   Thyroid No results for input(s): TSH, FREET4 in the last 168 hours.  BNPNo results for input(s): BNP, PROBNP in the last 168 hours.  DDimer  Recent Labs  Lab 05/23/21 1135  DDIMER 0.38     Radiology    No results found.  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   Assessment & Plan    CAD: Status post multivessel PCI last week.  Stopping all antiplatelet therapy would put him at high risk for stent thrombosis.  Continue cangrelor IV for now.  No oral antiplatelet agent.  Cangrelor has a very short half-life and will wear off quickly.  GI bleed: Plan for EGD, planned for today.  EGD showed several erosions.  PPI therapy was planned.  Okay to transition to clopidogrel.  300 mg given today.  75 mg daily after that.  Check CBC in a.m.  Hyperlipidemia: Has been on Praluent in the past.  Statin intolerance noted in the past.  Hypomagnesemia: Magnesium replaced.  Recheck in AM.    For questions or updates, please contact [place] Please consult [email or web address] for contact info under        Signed, Larae Grooms, MD  05/29/2021, 8:50 AM

## 2021-05-29 NOTE — Anesthesia Postprocedure Evaluation (Signed)
Anesthesia Post Note  Patient: Travis Padilla  Procedure(s) Performed: ESOPHAGOGASTRODUODENOSCOPY (EGD) WITH PROPOFOL     Patient location during evaluation: Endoscopy Anesthesia Type: MAC Level of consciousness: awake Pain management: pain level controlled Vital Signs Assessment: post-procedure vital signs reviewed and stable Respiratory status: spontaneous breathing, nonlabored ventilation, respiratory function stable and patient connected to nasal cannula oxygen Cardiovascular status: stable and blood pressure returned to baseline Postop Assessment: no apparent nausea or vomiting Anesthetic complications: no   No notable events documented.  Last Vitals:  Vitals:   05/29/21 1900 05/29/21 2000  BP: 116/71 113/68  Pulse:    Resp: 18 (!) 21  Temp:    SpO2: 98% 98%    Last Pain:  Vitals:   05/29/21 1600  TempSrc:   PainSc: 0-No pain                 Ceniyah Thorp P Dianna Deshler

## 2021-05-29 NOTE — Anesthesia Preprocedure Evaluation (Addendum)
Anesthesia Evaluation  Patient identified by MRN, date of birth, ID band Patient awake    Reviewed: Allergy & Precautions, NPO status , Patient's Chart, lab work & pertinent test results  Airway Mallampati: III  TM Distance: >3 FB Neck ROM: Full    Dental no notable dental hx.    Pulmonary sleep apnea , former smoker,    Pulmonary exam normal breath sounds clear to auscultation       Cardiovascular hypertension, Pt. on home beta blockers + CAD and + Cardiac Stents  Normal cardiovascular exam Rhythm:Regular Rate:Normal     Neuro/Psych negative neurological ROS  negative psych ROS   GI/Hepatic negative GI ROS, Neg liver ROS,   Endo/Other  Hypothyroidism   Renal/GU Renal disease     Musculoskeletal Gout   Abdominal (+) - obese,   Peds  Hematology  (+) anemia ,   Anesthesia Other Findings Melena anemia  Reproductive/Obstetrics                            Anesthesia Physical Anesthesia Plan  ASA: 3  Anesthesia Plan: MAC   Post-op Pain Management:    Induction: Intravenous  PONV Risk Score and Plan: 2 and Propofol infusion and Treatment may vary due to age or medical condition  Airway Management Planned: Nasal Cannula  Additional Equipment:   Intra-op Plan:   Post-operative Plan:   Informed Consent: I have reviewed the patients History and Physical, chart, labs and discussed the procedure including the risks, benefits and alternatives for the proposed anesthesia with the patient or authorized representative who has indicated his/her understanding and acceptance.     Dental advisory given  Plan Discussed with: CRNA  Anesthesia Plan Comments:       Anesthesia Quick Evaluation

## 2021-05-29 NOTE — Progress Notes (Addendum)
CARDIAC REHAB PHASE I   PRE:  Rate/Rhythm: 104 ST PVCs  BP:  Supine:   Sitting: 110/80  Standing:    SaO2: 100%RA  MODE:  Ambulation: 370 ft   POST:  Rate/Rhythm: 116 ST PVCs  BP:  Supine:   Sitting: 129/69  Standing:    SaO2: 100%RA 1344-1422 Pt walked 370 ft on RA with rolling walker and asst x 1. Denied dizziness or SOB. Tolerated well. Back to recliner after walk. Call bell in reach. Answered questions re exercise and CRP 2. Pt wants referral to [facility] which has been done. Discussed plavix and importance to stents.   Graylon Good, RN BSN  05/29/2021 2:21 PM

## 2021-05-29 NOTE — Transfer of Care (Signed)
Immediate Anesthesia Transfer of Care Note  Patient: Travis Padilla  Procedure(s) Performed: ESOPHAGOGASTRODUODENOSCOPY (EGD) WITH PROPOFOL  Patient Location: PACU  Anesthesia Type:MAC  Level of Consciousness: awake, alert , oriented and patient cooperative  Airway & Oxygen Therapy: Patient Spontanous Breathing  Post-op Assessment: Report given to RN and Post -op Vital signs reviewed and stable  Post vital signs: Reviewed and stable  Last Vitals:  Vitals Value Taken Time  BP 121/63 05/29/21 1009  Temp 36 C 05/29/21 1009  Pulse 96 05/29/21 1013  Resp 23 05/29/21 1013  SpO2 95 % 05/29/21 1013  Vitals shown include unvalidated device data.  Last Pain:  Vitals:   05/29/21 1009  TempSrc: Temporal  PainSc: 0-No pain      Patients Stated Pain Goal: 0 (60/04/59 9774)  Complications: No notable events documented.

## 2021-05-30 ENCOUNTER — Encounter (HOSPITAL_COMMUNITY): Payer: Self-pay | Admitting: Internal Medicine

## 2021-05-30 ENCOUNTER — Ambulatory Visit: Payer: Medicare Other | Admitting: Family Medicine

## 2021-05-30 DIAGNOSIS — I25110 Atherosclerotic heart disease of native coronary artery with unstable angina pectoris: Secondary | ICD-10-CM | POA: Diagnosis not present

## 2021-05-30 DIAGNOSIS — D62 Acute posthemorrhagic anemia: Secondary | ICD-10-CM

## 2021-05-30 DIAGNOSIS — Z9861 Coronary angioplasty status: Secondary | ICD-10-CM | POA: Diagnosis not present

## 2021-05-30 DIAGNOSIS — Z955 Presence of coronary angioplasty implant and graft: Secondary | ICD-10-CM | POA: Diagnosis not present

## 2021-05-30 DIAGNOSIS — K2901 Acute gastritis with bleeding: Secondary | ICD-10-CM | POA: Diagnosis not present

## 2021-05-30 LAB — CBC
HCT: 24 % — ABNORMAL LOW (ref 39.0–52.0)
HCT: 26.6 % — ABNORMAL LOW (ref 39.0–52.0)
Hemoglobin: 8.2 g/dL — ABNORMAL LOW (ref 13.0–17.0)
Hemoglobin: 8.8 g/dL — ABNORMAL LOW (ref 13.0–17.0)
MCH: 30.5 pg (ref 26.0–34.0)
MCH: 29.6 pg (ref 26.0–34.0)
MCHC: 34.2 g/dL (ref 30.0–36.0)
MCHC: 33.1 g/dL (ref 30.0–36.0)
MCV: 89.2 fL (ref 80.0–100.0)
MCV: 89.6 fL (ref 80.0–100.0)
Platelets: 129 10*3/uL — ABNORMAL LOW (ref 150–400)
Platelets: 169 10*3/uL (ref 150–400)
RBC: 2.69 MIL/uL — ABNORMAL LOW (ref 4.22–5.81)
RBC: 2.97 MIL/uL — ABNORMAL LOW (ref 4.22–5.81)
RDW: 14.4 % (ref 11.5–15.5)
RDW: 14.4 % (ref 11.5–15.5)
WBC: 8.4 10*3/uL (ref 4.0–10.5)
WBC: 8.7 10*3/uL (ref 4.0–10.5)
nRBC: 0 % (ref 0.0–0.2)
nRBC: 0 % (ref 0.0–0.2)

## 2021-05-30 LAB — BASIC METABOLIC PANEL
Anion gap: 6 (ref 5–15)
BUN: 16 mg/dL (ref 8–23)
CO2: 25 mmol/L (ref 22–32)
Calcium: 8.4 mg/dL — ABNORMAL LOW (ref 8.9–10.3)
Chloride: 107 mmol/L (ref 98–111)
Creatinine, Ser: 1.05 mg/dL (ref 0.61–1.24)
GFR, Estimated: >60 mL/min (ref >60)
Glucose, Bld: 131 mg/dL — ABNORMAL HIGH (ref 70–99)
Potassium: 3.7 mmol/L (ref 3.5–5.1)
Sodium: 138 mmol/L (ref 135–145)

## 2021-05-30 LAB — MAGNESIUM: Magnesium: 2 mg/dL (ref 1.7–2.4)

## 2021-05-30 LAB — H. PYLORI ANTIBODY, IGG: H Pylori IgG: 0.44 Index Value (ref 0.00–0.79)

## 2021-05-30 NOTE — Progress Notes (Signed)
CARDIAC REHAB PHASE I   PRE:  Rate/Rhythm: 104 ST PVCs  BP:  Supine:   Sitting: 141/83  Standing:    SaO2: 100%RA  MODE:  Ambulation: 410 ft   POST:  Rate/Rhythm: 120 ST PVCs  BP:  Supine:   Sitting: 113/77, 110/73  Standing:    SaO2: 100%RA 1027-1105 Pt walked 410 ft on RA pushing IV pole with gait belt use. Denied CP or dizziness. Tolerated well. To recliner after walk. BP lower after walk but denied dizziness. Call bell given.   Graylon Good, RN BSN  05/30/2021 11:10 AM

## 2021-05-30 NOTE — Progress Notes (Signed)
   Patient Name: Travis Padilla Date of Encounter: 05/30/2021, 10:50 AM    Subjective  No stools x 4 d A bit stronger "My blood is not cooperating" Now on clopidogrel   Objective  BP (!) 96/54   Pulse 90   Temp 98.2 F (36.8 C) (Oral)   Resp 19   Ht 6\' 2"  (1.88 m)   Wt 106.2 kg   SpO2 97%   BMI 30.05 kg/m  Pale Up and around walking w/ cardiac rehab No sig ecchymoses   CBC Latest Ref Rng & Units 05/30/2021 05/29/2021 05/29/2021  WBC 4.0 - 10.5 K/uL 8.4 10.7(H) 8.8  Hemoglobin 13.0 - 17.0 g/dL 8.2(L) 9.7(L) 8.8(L)  Hematocrit 39.0 - 52.0 % 24.0(L) 28.0(L) 25.0(L)  Platelets 150 - 400 K/uL 129(L) 162 136(L)     EGD 05/29/21 - Erosive gastropathy with no stigmata of recent bleeding. But I think this was source of melena in setting of anti-coag and anti-PLT - The examination was otherwise normal. - No specimens collected. Impression: - Return patient to ICU for ongoing care. - Continue bid PPI - - change to oral OK to stay on cagrelor or other anti-PLT   Assessment and Plan  Erosive gastropathy w/ bleeding in setting of antiPLT and anti-coag Tx for ACS Acute blood loss anemia  Continue current care Await H pylori testing results F/U Hgb - overall he looks better - variability in Hgb happens in these situatons  Gatha Mayer, MD, [facility] 05/30/2021 10:50 AM

## 2021-05-30 NOTE — TOC Initial Note (Signed)
Transition of Care [facility]) - Initial/Assessment Note    Patient Details  Name: Travis Padilla MRN: [id number] Date of Birth: 1946-06-02  [facility] CM/SW Contact:    Bethena Roys, RN Phone Number: 05/30/2021, 12:23 PM  Clinical Narrative: Risk for readmission assessment completed. Prior to arrival, patient was from home with family support. Patient states he was getting his medications without any issues and getting to appointments. Plan will be to return home once stable. Case Manager will continue to follow for additional transition of care needs.                  Expected Discharge Plan: Home/Self Care Barriers to Discharge: Continued Medical Work up   Patient Goals and CMS Choice Patient states their goals for this hospitalization and ongoing recovery are:: to return home   Choice offered to / list presented to : NA  Expected Discharge Plan and Services Expected Discharge Plan: Home/Self Care In-house Referral: NA Discharge Planning Services: CM Consult Post Acute Care Choice: NA Living arrangements for the past 2 months: Single Family Home                   DME Agency: NA       HH Arranged: NA          Prior Living Arrangements/Services Living arrangements for the past 2 months: Single Family Home Lives with:: Spouse Patient language and need for interpreter reviewed:: Yes Do you feel safe going back to the place where you live?: Yes      Need for Family Participation in Patient Care: Yes (Comment) Care giver support system in place?: Yes (comment)      Activities of Daily Living Home Assistive Devices/Equipment: Raised toilet seat with rails ADL Screening (condition at time of admission) Patient's cognitive ability adequate to safely complete daily activities?: Yes Is the patient deaf or have difficulty hearing?: No Does the patient have difficulty seeing, even when wearing glasses/contacts?: No Does the patient have difficulty  concentrating, remembering, or making decisions?: No Patient able to express need for assistance with ADLs?: Yes Does the patient have difficulty dressing or bathing?: No Independently performs ADLs?: Yes (appropriate for developmental age) Does the patient have difficulty walking or climbing stairs?: No Weakness of Legs: None Weakness of Arms/Hands: None  Permission Sought/Granted Permission sought to share information with : Family Supports, Case Manager                Emotional Assessment Appearance:: Appears stated age Attitude/Demeanor/Rapport: Engaged Affect (typically observed): Appropriate Orientation: : Oriented to Self, Oriented to Place, Oriented to  Time, Oriented to Situation Alcohol / Substance Use: Not Applicable Psych Involvement: No (comment)  Admission diagnosis:  Unstable angina (HCC) [I20.0] Patient Active Problem List   Diagnosis Date Noted   Acute blood loss anemia    Gastrointestinal hemorrhage associated with acute gastritis    Unstable angina ([place]) 05/25/2021   Coronary artery disease involving native coronary artery of native heart without angina pectoris 12/07/2020   SIRS (systemic inflammatory response syndrome) ([place]) 11/27/2020   Encephalitis    Thrombocytopenia (HCC)    Severe sepsis without septic shock ([place]) 11/26/2020   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   Itching 11/17/2019   Dyslipidemia 07/14/2019   Shoulder pain 03/31/2019   Port-A-Cath in place 08/11/2018   Hypothyroidism (acquired) 08/11/2018   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   Encounter for antineoplastic chemotherapy 05/13/2018   Encounter  for antineoplastic immunotherapy 05/13/2018   Goals of care, counseling/discussion 05/13/2018   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   Axillary adenopathy 04/24/2018   Right lower lobe lung mass 04/22/2018   Cough with hemoptysis 04/22/2018   Wheezing 04/02/2018    Long-term use of high-risk medication 04/02/2018   Dizziness 06/26/2017   Fatigue 04/30/2016   Insomnia 04/30/2016   Sinusitis 01/31/2016   Hyperlipidemia 01/31/2016   Ear pain 05/09/2015   Nephrolithiasis 04/18/2015   Anemia    Abnormal nuclear stress test    OBESITY, UNSPECIFIED 03/28/2009   Essential hypertension, benign 03/28/2009   PALPITATIONS 03/28/2009   SNORING 03/28/2009   PCP:  [name], DO Pharmacy:   [id number] [place], [place] [address] Phone: [phone] Fax: [phone]  [place] [address], [place] [address] [place] [address] Phone: [phone] Fax: [phone]  [facility] [address] [address] Phone: [phone] Fax: [phone]     Social Determinants of Health (SDOH) Interventions    Readmission Risk Interventions Readmission Risk Prevention Plan 05/30/2021  Transportation Screening Complete  PCP or Specialist Appt within 3-5 Days Complete  HRI or [place] Complete  Social Work Consult for [place] Planning/Counseling Complete  Palliative Care Screening Not Applicable  Medication Review Press photographer) Complete  Some recent data might be hidden

## 2021-05-30 NOTE — Plan of Care (Signed)
  Problem: Education: Goal: Knowledge of General Education information will improve Description: Including pain rating scale, medication(s)/side effects and non-pharmacologic comfort measures Outcome: Progressing   Problem: Health Behavior/Discharge Planning: Goal: Ability to manage health-related needs will improve Outcome: Progressing   Problem: Clinical Measurements: Goal: Will remain free from infection Outcome: Progressing Goal: Diagnostic test results will improve Outcome: Progressing Goal: Respiratory complications will improve Outcome: Progressing Goal: Cardiovascular complication will be avoided Outcome: Progressing   Problem: Nutrition: Goal: Adequate nutrition will be maintained Outcome: Progressing   Problem: Coping: Goal: Level of anxiety will decrease Outcome: Progressing

## 2021-05-30 NOTE — Progress Notes (Signed)
Progress Note  Patient Name: Travis Padilla Date of Encounter: 05/30/2021  [facility] Cardiologist: [name], MD   Subjective   Does not feel as well today as he did yesterday.  He had a hemoglobin reading in the morning of 8.8.  Subsequent reading went to  9.7 and then to 8.2.  I suspect the 9.7 was likely spurious.  Inpatient Medications    Scheduled Meds:  Chlorhexidine Gluconate Cloth  6 each Topical Daily   clopidogrel  75 mg Oral Daily   fluticasone  1 spray Each Nare Daily   umeclidinium bromide  1 puff Inhalation Daily   And   fluticasone furoate-vilanterol  1 puff Inhalation Daily   levothyroxine  150 mcg Oral Q0600   pantoprazole  40 mg Oral BID AC   senna-docusate  1 tablet Oral BID   sodium chloride flush  3 mL Intravenous Q12H   sodium chloride flush  3 mL Intravenous Q12H   sodium chloride flush  3 mL Intravenous Q12H   sucralfate  1 g Oral TID WC & HS   tamsulosin  0.4 mg Oral QHS   Continuous Infusions:  sodium chloride Stopped (05/25/21 1610)   sodium chloride     sodium chloride Stopped (05/26/21 1615)   PRN Meds: sodium chloride, sodium chloride, sodium chloride, acetaminophen, albuterol, ALPRAZolam, nitroGLYCERIN, ondansetron (ZOFRAN) IV, sodium chloride flush, sodium chloride flush, sodium chloride flush, zolpidem   Vital Signs    Vitals:   05/30/21 0100 05/30/21 0200 05/30/21 0300 05/30/21 0700  BP: 98/63 (!) 144/70 (!) 96/54   Pulse:      Resp: 18 (!) 21 19   Temp:   98 F (36.7 C) 98.2 F (36.8 C)  TempSrc:   Oral Oral  SpO2: 93% 97%    Weight:      Height:        Intake/Output Summary (Last 24 hours) at 05/30/2021 1107 Last data filed at 05/30/2021 0100 Gross per 24 hour  Intake 1012.39 ml  Output 1325 ml  Net -312.61 ml   Last 3 Weights 05/29/2021 05/26/2021 05/25/2021  Weight (lbs) 234 lb 1.4 oz 234 lb 1.6 oz 222 lb  Weight (kg) 106.18 kg 106.187 kg 100.699 kg      Telemetry    Sinus tach- Personally Reviewed  ECG       Physical Exam   GEN: No acute distress.  Frail Neck: No JVD Cardiac: RRR, no murmurs, rubs, or gallops.  Respiratory: Clear to auscultation bilaterally. GI: Soft, nontender, non-distended  MS: No edema; No deformity.  Right radial without hematoma Neuro:  Nonfocal  Psych: Normal affect   Labs    High Sensitivity Troponin:   Recent Labs  Lab 05/22/21 1951 05/23/21 1135  TROPONINIHS 10 16     Chemistry Recent Labs  Lab 05/28/21 0600 05/29/21 0642 05/30/21 0142  NA 139 137 138  K 4.0 3.8 3.7  CL 111 106 107  CO2 21* 24 25  GLUCOSE 120* 118* 131*  BUN 37* 16 16  CREATININE 1.03 1.02 1.05  CALCIUM 8.2* 8.1* 8.4*  MG 1.7 1.7 2.0  GFRNONAA >60 >60 >60  ANIONGAP 7 7 6     Lipids  Recent Labs  Lab 05/24/21 1151  CHOL 123  TRIG 191*  HDL 38*  LABVLDL 31  LDLCALC 54  CHOLHDL 3.2    Hematology Recent Labs  Lab 05/29/21 0642 05/29/21 1340 05/30/21 0142  WBC 8.8 10.7* 8.4  RBC 2.86* 3.18* 2.69*  HGB 8.8*  9.7* 8.2*  HCT 25.0* 28.0* 24.0*  MCV 87.4 88.1 89.2  MCH 30.8 30.5 30.5  MCHC 35.2 34.6 34.2  RDW 14.5 14.6 14.4  PLT 136* 162 129*   Thyroid No results for input(s): TSH, FREET4 in the last 168 hours.  BNPNo results for input(s): BNP, PROBNP in the last 168 hours.  DDimer  Recent Labs  Lab 05/23/21 1135  DDIMER 0.38     Radiology    No results found.  Cardiac Studies   Cath with multivessel stenting  Patient Profile     75 y.o. male GI bleed post cardiac cath due to gastric erosions  Assessment & Plan    CAD: Plan to transfer out of unit today.  Continue to monitor hemoglobin.  No angina.  Continue Plavix monotherapy to minimize risk of bleeding.  PCSK9 inhibitor for hyperlipidemia in the past.  Plan to transfer to the floor.  Anemia/GI bleeding: Appreciate Dr. Celesta Aver input.  Awaiting H. pylori test.  If hemoglobin drops below 8, he will need another unit of blood.  He has not had a bowel movement today.  Magnesium level  improved today.  For questions or updates, please contact [place] Please consult [email or web address] for contact info under        Signed, Larae Grooms, MD  05/30/2021, 11:07 AM

## 2021-05-31 ENCOUNTER — Ambulatory Visit: Payer: Medicare Other | Admitting: Cardiology

## 2021-05-31 ENCOUNTER — Encounter: Payer: Self-pay | Admitting: Internal Medicine

## 2021-05-31 ENCOUNTER — Other Ambulatory Visit (HOSPITAL_COMMUNITY): Payer: Self-pay

## 2021-05-31 DIAGNOSIS — I10 Essential (primary) hypertension: Secondary | ICD-10-CM

## 2021-05-31 LAB — CBC
HCT: 25.3 % — ABNORMAL LOW (ref 39.0–52.0)
Hemoglobin: 8.7 g/dL — ABNORMAL LOW (ref 13.0–17.0)
MCH: 30.6 pg (ref 26.0–34.0)
MCHC: 34.4 g/dL (ref 30.0–36.0)
MCV: 89.1 fL (ref 80.0–100.0)
Platelets: 140 10*3/uL — ABNORMAL LOW (ref 150–400)
RBC: 2.84 MIL/uL — ABNORMAL LOW (ref 4.22–5.81)
RDW: 14.6 % (ref 11.5–15.5)
WBC: 8.4 10*3/uL (ref 4.0–10.5)
nRBC: 0 % (ref 0.0–0.2)

## 2021-05-31 MED ORDER — CLOPIDOGREL BISULFATE 75 MG PO TABS
75.0000 mg | ORAL_TABLET | Freq: Every day | ORAL | 2 refills | Status: DC
  Filled 2021-05-31: qty 90, 90d supply, fill #0

## 2021-05-31 MED ORDER — PANTOPRAZOLE SODIUM 40 MG PO TBEC
40.0000 mg | DELAYED_RELEASE_TABLET | Freq: Every day | ORAL | Status: DC
Start: 2021-06-01 — End: 2021-05-31

## 2021-05-31 MED ORDER — IRON (FERROUS SULFATE) 325 (65 FE) MG PO TABS: 325.0000 mg | ORAL_TABLET | Freq: Every day | ORAL | 2 refills | Status: DC

## 2021-05-31 MED ORDER — NITROGLYCERIN 0.4 MG SL SUBL
0.4000 mg | SUBLINGUAL_TABLET | SUBLINGUAL | 2 refills | Status: DC | PRN
Start: 2021-05-31 — End: 2021-11-30
  Filled 2021-05-31: qty 25, 7d supply, fill #0

## 2021-05-31 MED ORDER — SODIUM CHLORIDE 0.9 % IV SOLN
510.0000 mg | Freq: Once | INTRAVENOUS | Status: AC
  Administered 2021-05-31: 510 mg via INTRAVENOUS
  Filled 2021-05-31: qty 17

## 2021-05-31 MED ORDER — PANTOPRAZOLE SODIUM 40 MG PO TBEC
40.0000 mg | DELAYED_RELEASE_TABLET | Freq: Every day | ORAL | 0 refills | Status: DC
  Filled 2021-05-31: qty 90, 90d supply, fill #0

## 2021-05-31 NOTE — Plan of Care (Signed)

## 2021-05-31 NOTE — Discharge Summary (Addendum)
Discharge Summary    Patient ID: Travis Padilla MRN: [id number]; DOB: Oct 02, 1945  Admit date: 05/25/2021 Discharge date: 05/31/2021  PCP:  [name], DO   Early Providers Cardiologist:  [name], MD   Discharge Diagnoses    Principal Problem:   Unstable angina [facility]) Active Problems:   Essential hypertension, benign   Hyperlipidemia   Thrombocytopenia (HCC)   Gastrointestinal hemorrhage associated with acute gastritis   Acute blood loss anemia  Diagnostic Studies/Procedures    Cardiac Catheterization: 05/25/2021 Conclusions: Severe two-vessel coronary artery disease including 80% mid LAD stenosis, chronic total occlusion of moderate-caliber D1, and sequential 70-95% proximal-mid RCA lesions with severe calcification. Low-normal left ventricular systolic function (LVEF 65-46%) with normal filling pressure (LVEDP 5-10 mmHg). Successful PCI to mid LAD using Onyx Frontier 3.0 x 22 mm drug-eluting stent with 0% residual stenosis and TIMI-3 flow   Recommendations: Plan for staged PCI to proximal/mid RCA with atherectomy as soon as tomorrow if renal function allows. Continue dual antiplatelet therapy with aspirin and ticagrelor for at least 12 months. Aggressive secondary prevention.     Coronary Atherectomy: 05/26/2021   Prox RCA-1 lesion is 95% stenosed.   Prox RCA-2 lesion is 70% stenosed.   Mid RCA lesion is 80% stenosed.   A stent was successfully placed.   A stent was successfully placed.   Post intervention, there is a 0% residual stenosis.   Post intervention, there is a 0% residual stenosis.   Post intervention, there is a 0% residual stenosis.   1.  Successful stenting of high-grade calcified serial lesions of the proximal and mid right coronary artery with 2 overlapping drug-eluting stents with IVUS optimization.   Recommendation: The patient could should continue dual antiplatelet therapy for at least 6 months and aggressive cardiovascular  risk factor modification. _____________   History of Present Illness     Travis Padilla is a 75 y.o. male with a past medical history of stage IV squamous cell carcinoma, OSA, HLD, HTN, anemia, and gout who presented to the office with chest pain.   He has been treated for small cell lung cancer.   He was treated with carrboplatin, paclitaxel  and Keytruda.  He has had radiation.    Recent CT demonstrated aortic atherosclerosis as well as coronary artery calcification. POET (Plain Old Exercise Treadmill) demonstrated no high risk findings. He was in the ED with chest pain just prior to office visit.    He said that he started using CPAP.Marland Kitchen  After he put it on he had an episode of shortness of breath and chest and neck tightness.  He following he was suffocating. He went to the emergency room at [facility] long after having another episode of discomfort after getting back injection.  However, he did not wait to be seen.  Enzymes were negative.  He was encouraged to go back to the ER he actually went to [place]. Noted a new right bundle branch block which was new compared to previous.  D-dimer was unremarkable.  Cardiac enzymes were again unremarkable.  He was discharged from the emergency room.  Again he described the discomfort as a dull tightness in his chest and radiating up to his neck.  It happens at rest.  He is not having any resting shortness of breath, PND or orthopnea.  He does not notice any palpitations and has had no presyncope or syncope.  He was set up for outpatient cardiac cath.    Hospital Course  Consultants: GI    CAD/Unstable Angina: The patient was evaluated at [facility] ED on 05/23/2021 for chest pain and Hs troponin values were negative. He did follow-up with Dr. [name] and a catheterization was recommended for definitive evaluation. Initial cardiac catheterization showed 2-vessel CAD with 80% mid-LAD stenosis, CTO of D1 and sequential 70-95% proximal-mid RCA lesions and  underwent PCI to the mid-LAD with DESx1. Was recommended to have staged PCI of the RCA and this was performed on 05/26/2021 with DESx2 to the proximal and mid RCA. Initially placed on ASA and Brilinta but developed acute drop in H/H, see anemia below. -- planned for plavix as monotherapy, no ASA/NSAID use in the setting of GI bleed   HTN: Developed transient hypotension in the setting of GI bleed.  Blood pressure stabilized but unable to add antihypertensive at the time of discharge.  Need to consider at follow-up appointment.   HLD: LDL at 54 when checked earlier this month.  -- On Praluent as an outpatient.    COPD/OSA: He was on Trelegy prior to admission and has been receiving Breo this admission. Prior sleep study was positive for CPAP but has not been arranged. Sleep apnea followed by Dr. Rexene Alberts.    Anemia 2/2 GI bleed: Hemoglobin dropped from 12-7.9 on 9/17.  Noted to be FOBT positive.  His DAPT was briefly held in the setting of concern for cardiogenic shock.  He was bridged with cangrelor.  S/p 3 units PRBCs.  Evaluated by GI and underwent EGD 9/19 noting erosive gastropathy with no recent bleeding.  It was felt that was the source of his melena. H. Pylori negative. Received IV iron prior to discharge. Added daily Iron 325mg  -- Recommendations to continue on antiplatelet, he was placed on Plavix without aspirin.  Also with recommendations for Protonix 40 mg daily.  Did the patient have an acute coronary syndrome (MI, NSTEMI, STEMI, etc) this admission?:  No                               Did the patient have a percutaneous coronary intervention (stent / angioplasty)?:  Yes.     Cath/PCI Registry Performance & Quality Measures: Aspirin prescribed? - No - monotherapy with plavix ADP Receptor Inhibitor (Plavix/Clopidogrel, Brilinta/Ticagrelor or Effient/Prasugrel) prescribed (includes medically managed patients)? - Yes High Intensity Statin (Lipitor 40-80mg  or Crestor 20-40mg ) prescribed? - No  - on PCSK9 For EF <40%, was ACEI/ARB prescribed? - Not Applicable (EF >/= 00%) For EF <40%, Aldosterone Antagonist (Spironolactone or Eplerenone) prescribed? - Not Applicable (EF >/= 86%) Cardiac Rehab Phase II ordered? - Yes      _____________  Discharge Vitals Blood pressure 101/73, pulse (!) 108, temperature 98.2 F (36.8 C), temperature source Oral, resp. rate 19, height 6\' 2"  (1.88 m), weight 106.2 kg, SpO2 97 %.  Filed Weights   05/25/21 0738 05/26/21 0607 05/29/21 0918  Weight: 100.7 kg 106.2 kg 106.2 kg    Labs & Radiologic Studies    CBC Recent Labs    05/29/21 0642 05/29/21 1340 05/30/21 1209 05/31/21 0025  WBC 8.8   < > 8.7 8.4  NEUTROABS 6.1  --   --   --   HGB 8.8*   < > 8.8* 8.7*  HCT 25.0*   < > 26.6* 25.3*  MCV 87.4   < > 89.6 89.1  PLT 136*   < > 169 140*   < > = values in this interval not  displayed.   Basic Metabolic Panel Recent Labs    05/29/21 0642 05/30/21 0142  NA 137 138  K 3.8 3.7  CL 106 107  CO2 24 25  GLUCOSE 118* 131*  BUN 16 16  CREATININE 1.02 1.05  CALCIUM 8.1* 8.4*  MG 1.7 2.0   Liver Function Tests No results for input(s): AST, ALT, ALKPHOS, BILITOT, PROT, ALBUMIN in the last 72 hours. No results for input(s): LIPASE, AMYLASE in the last 72 hours. High Sensitivity Troponin:   Recent Labs  Lab 05/22/21 1951 05/23/21 1135  TROPONINIHS 10 16    BNP Invalid input(s): POCBNP D-Dimer No results for input(s): DDIMER in the last 72 hours. Hemoglobin A1C No results for input(s): HGBA1C in the last 72 hours. Fasting Lipid Panel No results for input(s): CHOL, HDL, LDLCALC, TRIG, CHOLHDL, LDLDIRECT in the last 72 hours. Thyroid Function Tests No results for input(s): TSH, T4TOTAL, T3FREE, THYROIDAB in the last 72 hours.  Invalid input(s): FREET3 _____________  DG Chest 2 View  Result Date: 05/22/2021 CLINICAL DATA:  Chest pain EXAM: CHEST - 2 VIEW COMPARISON:  CT chest 04/21/2021, chest radiograph 11/26/2020 FINDINGS: A  right chest wall port is in place terminating in the region of the superior cavoatrial junction. The cardiomediastinal silhouette is grossly stable. Patchy and linear opacities in the right hilar region with associated architectural distortion are similar to the prior study. The known left upper lobe mass is not well seen on the current study. There is no new focal airspace disease. There is no pleural effusion. There is no pneumothorax. There is no acute osseous abnormality. IMPRESSION: 1. No radiographic evidence of acute cardiopulmonary process. 2. Stable architectural distortion in the right hilar region likely reflecting postradiation change. 3. The known left upper lobe mass is not well seen on the current study. Electronically Signed   By: Valetta Mole M.D.   On: 05/22/2021 19:48   CARDIAC CATHETERIZATION  Result Date: 05/26/2021   Prox RCA-1 lesion is 95% stenosed.   Prox RCA-2 lesion is 70% stenosed.   Mid RCA lesion is 80% stenosed.   A stent was successfully placed.   A stent was successfully placed.   Post intervention, there is a 0% residual stenosis.   Post intervention, there is a 0% residual stenosis.   Post intervention, there is a 0% residual stenosis. 1.  Successful stenting of high-grade calcified serial lesions of the proximal and mid right coronary artery with 2 overlapping drug-eluting stents with IVUS optimization. Recommendation: The patient could should continue dual antiplatelet therapy for at least 6 months and aggressive cardiovascular risk factor modification.   CARDIAC CATHETERIZATION  Result Date: 05/25/2021 Conclusions: Severe two-vessel coronary artery disease including 80% mid LAD stenosis, chronic total occlusion of moderate-caliber D1, and sequential 70-95% proximal-mid RCA lesions with severe calcification. Low-normal left ventricular systolic function (LVEF 48-18%) with normal filling pressure (LVEDP 5-10 mmHg). Successful PCI to mid LAD using Onyx Frontier 3.0 x 22 mm  drug-eluting stent with 0% residual stenosis and TIMI-3 flow Recommendations: Plan for staged PCI to proximal/mid RCA with atherectomy as soon as tomorrow if renal function allows. Continue dual antiplatelet therapy with aspirin and ticagrelor for at least 12 months. Aggressive secondary prevention. Nelva Bush, MD [facility]  CT BIOPSY  Result Date: 05/22/2021 CLINICAL DATA:  Joint disorder. Left sacroiliitis. Minimal response to physical therapy. Patient has a history of contrast reaction and used a 13 hour prep. EXAM: Left CT GUIDED SI JOINT INJECTION PROCEDURE: After a thorough discussion  of risks and benefits of the procedure, including bleeding, infection, injury to nerves, blood vessels, and adjacent structures, verbal and written consent was obtained. Specific risks of the procedure included nondiagnostic/nontherapeutic injection and non target injection. The patient was placed prone on the CT table and localization was performed over the sacrum. Target site marked using CT guidance. The skin was prepped and draped in the usual sterile fashion using Betadine soap. After local anesthesia with 1% lidocaine without epinephrine and subsequent deep anesthesia, a 22 gauge spinal needle was advanced into the left SI joint under intermittent CT guidance. Once the needle was in satisfactory position, representative image following injection of 1 mL Isovue M 200 was captured with the needle demonstrated in the sacroiliac joint. Confirmed position subsequently, 2 mL 0.5% bupivacaine was injected into the left SI joint. Needles removed and a sterile dressing applied. Patient reported cord pain during the injection. He has fairly low-level pain prior to the injection, potentially secondary to the steroids. There was no significant change to his pain after the injection. We discussed logging his pain over the next 24-36 hours. No complications were observed. IMPRESSION: Successful CT-guided left anesthetic  only SI joint injection. Electronically Signed   By: San Morelle M.D.   On: 05/22/2021 14:59   Disposition   Pt is being discharged home today in good condition.  Follow-up Plans & Appointments     Follow-up Information     Deberah Pelton, NP Follow up on 06/06/2021.   Specialty: Cardiology Why: Cardiology Hospital Follow-up on 06/06/2021 at 2:45 PM with Coletta Memos, NP (works with Dr. [name]). Contact information: [address] [place] [address] [phone]                Discharge Instructions     Amb Referral to Cardiac Rehabilitation   Complete by: As directed    Diagnosis:  Coronary Stents PTCA     After initial evaluation and assessments completed: Virtual Based Care may be provided alone or in conjunction with Phase 2 Cardiac Rehab based on patient barriers.: Yes   Call MD for:  difficulty breathing, headache or visual disturbances   Complete by: As directed    Call MD for:  persistant dizziness or light-headedness   Complete by: As directed    Call MD for:  redness, tenderness, or signs of infection (pain, swelling, redness, odor or green/yellow discharge around incision site)   Complete by: As directed    Diet - low sodium heart healthy   Complete by: As directed    Discharge instructions   Complete by: As directed    Radial Site Care Refer to this sheet in the next few weeks. These instructions provide you with information on caring for yourself after your procedure. Your caregiver may also give you more specific instructions. Your treatment has been planned according to current medical practices, but problems sometimes occur. Call your caregiver if you have any problems or questions after your procedure. HOME CARE INSTRUCTIONS You may shower the day after the procedure. Remove the bandage (dressing) and gently wash the site with plain soap and water. Gently pat the site dry.  Do not apply powder or lotion to the site.  Do not  submerge the affected site in water for 3 to 5 days.  Inspect the site at least twice daily.  Do not flex or bend the affected arm for 24 hours.  No lifting over 5 pounds (2.3 kg) for 5 days after your procedure.  Do not drive home if you are discharged the same day of the procedure. Have someone else drive you.  You may drive 24 hours after the procedure unless otherwise instructed by your caregiver.  What to expect: Any bruising will usually fade within 1 to 2 weeks.  Blood that collects in the tissue (hematoma) may be painful to the touch. It should usually decrease in size and tenderness within 1 to 2 weeks.  SEEK IMMEDIATE MEDICAL CARE IF: You have unusual pain at the radial site.  You have redness, warmth, swelling, or pain at the radial site.  You have drainage (other than a small amount of blood on the dressing).  You have chills.  You have a fever or persistent symptoms for more than 72 hours.  You have a fever and your symptoms suddenly get worse.  Your arm becomes pale, cool, tingly, or numb.  You have heavy bleeding from the site. Hold pressure on the site.   PLEASE DO NOT MISS ANY DOSES OF YOUR PLAVIX!!!!! Also keep a log of you blood pressures and bring back to your follow up appt. Please call the office with any questions.   Patients taking blood thinners should generally stay away from medicines like ibuprofen, Advil, Motrin, naproxen, and Aleve due to risk of stomach bleeding. You may take Tylenol as directed or talk to your primary doctor about alternatives.  Some studies suggest Prilosec/Omeprazole interacts with Plavix. We changed your Prilosec/Omeprazole to the equivalent dose of Protonix for less chance of interaction.  PLEASE ENSURE THAT YOU DO NOT RUN OUT OF YOUR PLAVIX. This medication is very important to remain on for at least one year. IF you have issues obtaining this medication due to cost please CALL the office 3-5 business days prior to running out in order  to prevent missing doses of this medication.   Increase activity slowly   Complete by: As directed        Discharge Medications   Allergies as of 05/31/2021       Reactions   Pravastatin    Numbness in feet    Iodine Hives, Rash   CT scan contrast caused rash and itching         Medication List     STOP taking these medications    carvedilol 12.5 MG tablet Commonly known as: COREG   diphenhydrAMINE 50 MG tablet Commonly known as: BENADRYL   Nurtec 75 MG Tbdp Generic drug: Rimegepant Sulfate   predniSONE 50 MG tablet Commonly known as: DELTASONE       TAKE these medications    acetaminophen 500 MG tablet Commonly known as: TYLENOL Take 1,000 mg by mouth every 6 (six) hours as needed.   albuterol 108 (90 Base) MCG/ACT inhaler Commonly known as: VENTOLIN HFA Inhale 2 puffs into the lungs every 6 (six) hours as needed for wheezing or shortness of breath.   ALPRAZolam 0.25 MG tablet Commonly known as: XANAX Take 1 tablet (0.25 mg total) by mouth at bedtime as needed for anxiety.   clopidogrel 75 MG tablet Commonly known as: PLAVIX Take 1 tablet (75 mg total) by mouth daily. Start taking on: June 01, 2021   fluticasone 50 MCG/ACT nasal spray Commonly known as: FLONASE Place 1 spray into both nostrils daily.   Iron (Ferrous Sulfate) 325 (65 Fe) MG Tabs Take 325 mg by mouth daily.   levothyroxine 150 MCG tablet Commonly known as: SYNTHROID Take 1 tablet (150 mcg total) by mouth daily before breakfast.  methocarbamol 500 MG tablet Commonly known as: ROBAXIN Take 500 mg by mouth daily.   nitroGLYCERIN 0.4 MG SL tablet Commonly known as: NITROSTAT Place 1 tablet (0.4 mg total) under the tongue every 5 (five) minutes as needed for chest pain.   pantoprazole 40 MG tablet Commonly known as: PROTONIX Take 1 tablet (40 mg total) by mouth daily before breakfast. Start taking on: June 01, 2021 What changed: See the new instructions.    polyethylene glycol powder 17 GM/SCOOP powder Commonly known as: GLYCOLAX/MIRALAX Take 1 Container by mouth once.   Praluent 150 MG/ML Soaj Generic drug: Alirocumab INJECT 150 MG INTO THE SKIN EVERY 14 (FOURTEEN) DAYS.   senna-docusate 8.6-50 MG tablet Commonly known as: Senna S 1 to 2 twice daily for constipation   SOOTHE XP OP Place 1 drop into both eyes 2 (two) times daily.   tamsulosin 0.4 MG Caps capsule Commonly known as: FLOMAX Take 0.4 mg by mouth at bedtime.   Trelegy Ellipta 100-62.5-25 MCG/INH Aepb Generic drug: Fluticasone-Umeclidin-Vilant Inhale 1 puff into the lungs daily.         Outstanding Labs/Studies   CBC/BMET at follow up  Duration of Discharge Encounter   Greater than 30 minutes including physician time.  Signed, Reino Bellis, NP 05/31/2021, 3:30 PM  I have examined the patient and reviewed assessment and plan and discussed with patient.  Agree with above as stated.    CAD: s/p multivessel PCI. Atherectomy of the RCA.  Bleeding on Brilinta.  Now on Plavix monotherapy.  No aspirin.  Avoid NSAIDs due to GI bleeding.  Hemoglobin stable.  He will need his H. pylori antigen test followed up as an outpatient.  He can use once daily proton pump inhibitor, Protonix 40 mg daily.   Per GI, he will need iron as well.  Plan for CBC sometime next week.  GI follow-up as needed.  Son will be picking him up today for discharge.   Larae Grooms

## 2021-05-31 NOTE — Progress Notes (Signed)
   Patient Name: Travis Padilla Date of Encounter: 05/31/2021, 10:31 AM    Subjective  For dc today No bleeding   Objective  BP 118/76   Pulse (!) 105   Temp 98.2 F (36.8 C) (Oral)   Resp (!) 24   Ht 6\' 2"  (1.88 m)   Wt 106.2 kg   SpO2 96%   BMI 30.05 kg/m   CBC Latest Ref Rng & Units 05/31/2021 05/30/2021 05/30/2021  WBC 4.0 - 10.5 K/uL 8.4 8.7 8.4  Hemoglobin 13.0 - 17.0 g/dL 8.7(L) 8.8(L) 8.2(L)  Hematocrit 39.0 - 52.0 % 25.3(L) 26.6(L) 24.0(L)  Platelets 150 - 400 K/uL 140(L) 098 119(J)    Helicobacter Pylori ab is negative - I cancelled stool test    Assessment and Plan  Gastritis with bleeding in setting of anti-coag and antiPLT Tx  OOK to dc  Does not need GI vcisit after hospitalization  NEEDS:  Chronic daily PPI (one a day) Iron Tx F/U CBC thru PCP and return to GI if felt needed after outpt cardiology/PCP F/U  Gatha Mayer, MD, [facility] 05/31/2021 10:31 AM

## 2021-05-31 NOTE — Progress Notes (Signed)
Progress Note  Patient Name: Travis Padilla Date of Encounter: 05/31/2021  [facility] Cardiologist: [name], MD   Subjective   No chest pain.  Feels better today.  Hemoglobin stable.  He did have a bowel movement that was not dark or tarry.  Inpatient Medications    Scheduled Meds:  Chlorhexidine Gluconate Cloth  6 each Topical Daily   clopidogrel  75 mg Oral Daily   fluticasone  1 spray Each Nare Daily   umeclidinium bromide  1 puff Inhalation Daily   And   fluticasone furoate-vilanterol  1 puff Inhalation Daily   levothyroxine  150 mcg Oral Q0600   [START ON 06/01/2021] pantoprazole  40 mg Oral QAC breakfast   senna-docusate  1 tablet Oral BID   sodium chloride flush  3 mL Intravenous Q12H   sodium chloride flush  3 mL Intravenous Q12H   sodium chloride flush  3 mL Intravenous Q12H   sucralfate  1 g Oral TID WC & HS   tamsulosin  0.4 mg Oral QHS   Continuous Infusions:  sodium chloride Stopped (05/25/21 1610)   sodium chloride     sodium chloride Stopped (05/26/21 1615)   PRN Meds: sodium chloride, sodium chloride, sodium chloride, acetaminophen, albuterol, ALPRAZolam, nitroGLYCERIN, ondansetron (ZOFRAN) IV, sodium chloride flush, sodium chloride flush, sodium chloride flush, zolpidem   Vital Signs    Vitals:   05/31/21 0845 05/31/21 0900 05/31/21 0903 05/31/21 0915  BP:  118/76    Pulse:  (!) 104 (!) 126 (!) 105  Resp: (!) 21 20 (!) 24 (!) 24  Temp:      TempSrc:      SpO2: 99% 95% 98% 96%  Weight:      Height:        Intake/Output Summary (Last 24 hours) at 05/31/2021 1042 Last data filed at 05/31/2021 0900 Gross per 24 hour  Intake 960 ml  Output 2450 ml  Net -1490 ml   Last 3 Weights 05/29/2021 05/26/2021 05/25/2021  Weight (lbs) 234 lb 1.4 oz 234 lb 1.6 oz 222 lb  Weight (kg) 106.18 kg 106.187 kg 100.699 kg      Telemetry    Normal sinus rhythm, sinus tachycardia- Personally Reviewed  ECG      Physical Exam   GEN: No acute  distress.   Neck: No JVD Cardiac: RRR, no murmurs, rubs, or gallops.  Respiratory: Clear to auscultation bilaterally. GI: Soft, nontender, non-distended  MS: No edema; No deformity.  Right radial site mildly bruised Neuro:  Nonfocal  Psych: Normal affect   Labs    High Sensitivity Troponin:   Recent Labs  Lab 05/22/21 1951 05/23/21 1135  TROPONINIHS 10 16     Chemistry Recent Labs  Lab 05/28/21 0600 05/29/21 0642 05/30/21 0142  NA 139 137 138  K 4.0 3.8 3.7  CL 111 106 107  CO2 21* 24 25  GLUCOSE 120* 118* 131*  BUN 37* 16 16  CREATININE 1.03 1.02 1.05  CALCIUM 8.2* 8.1* 8.4*  MG 1.7 1.7 2.0  GFRNONAA >60 >60 >60  ANIONGAP 7 7 6     Lipids  Recent Labs  Lab 05/24/21 1151  CHOL 123  TRIG 191*  HDL 38*  LABVLDL 31  LDLCALC 54  CHOLHDL 3.2    Hematology Recent Labs  Lab 05/30/21 0142 05/30/21 1209 05/31/21 0025  WBC 8.4 8.7 8.4  RBC 2.69* 2.97* 2.84*  HGB 8.2* 8.8* 8.7*  HCT 24.0* 26.6* 25.3*  MCV 89.2 89.6 89.1  MCH 30.5 29.6 30.6  MCHC 34.2 33.1 34.4  RDW 14.4 14.4 14.6  PLT 129* 169 140*   Thyroid No results for input(s): TSH, FREET4 in the last 168 hours.  BNPNo results for input(s): BNP, PROBNP in the last 168 hours.  DDimer No results for input(s): DDIMER in the last 168 hours.   Radiology    No results found.  Cardiac Studies   Cath results with multivessel PCI  Patient Profile     75 y.o. male CAD, GI bleeding status post PCI  Assessment & Plan    CAD: Plavix monotherapy.  No aspirin.  Avoid NSAIDs due to GI bleeding.  Hemoglobin stable.  He will need his H. pylori antigen test followed up as an outpatient.  He can use once daily proton pump inhibitor, Protonix 40 mg daily.  Per GI, he will need iron as well.  Plan for CBC sometime next week.  GI follow-up as needed.  For questions or updates, please contact [place] Please consult [email or web address] for contact info under        Signed, Larae Grooms, MD  05/31/2021,  10:42 AM

## 2021-05-31 NOTE — Progress Notes (Signed)
CARDIAC REHAB PHASE I   PRE:  Rate/Rhythm: 112 ST  BP:  Supine:   Sitting: 118/76  Standing:    SaO2: 97%RA  MODE:  Ambulation: 740 ft   POST:  Rate/Rhythm: 123 ST  BP:  Supine:   Sitting: 103/72  Standing:    SaO2: 97%RA 0937-1005 Pt walked 740 ft on RA holding to side rail at times. Denied dizziness or CP. HR to 123. To recliner after walk with call bell. Answered questions re ex progression at home.   Graylon Good, RN BSN  05/31/2021 10:09 AM

## 2021-06-01 ENCOUNTER — Other Ambulatory Visit: Payer: Self-pay | Admitting: Neurology

## 2021-06-01 ENCOUNTER — Other Ambulatory Visit: Payer: Self-pay

## 2021-06-01 ENCOUNTER — Encounter: Payer: Self-pay | Admitting: Neurology

## 2021-06-01 DIAGNOSIS — R9439 Abnormal result of other cardiovascular function study: Secondary | ICD-10-CM

## 2021-06-01 DIAGNOSIS — C3491 Malignant neoplasm of unspecified part of right bronchus or lung: Secondary | ICD-10-CM

## 2021-06-01 DIAGNOSIS — I10 Essential (primary) hypertension: Secondary | ICD-10-CM

## 2021-06-01 DIAGNOSIS — I2510 Atherosclerotic heart disease of native coronary artery without angina pectoris: Secondary | ICD-10-CM

## 2021-06-01 NOTE — Patient Outreach (Signed)
[facility]) Care Management  06/01/2021  Travis Padilla 04/11/1946 [id number]  [name] [ACO] Patient:  Medicare  Patient was high risk post hospital admission in  an Embedded practice which has a chronic disease management Embedded Care Management team.  Plan: Referral request sent to the [place] Management follow up for high risk patient.  Please contact for further questions,  Natividad Brood, RN BSN [facility] Liaison  [phone] business mobile phone Toll free office [phone]  Fax number: [phone] [email or web address]

## 2021-06-02 ENCOUNTER — Telehealth: Payer: Self-pay | Admitting: Family Medicine

## 2021-06-02 ENCOUNTER — Telehealth: Payer: Self-pay

## 2021-06-02 NOTE — Telephone Encounter (Signed)
Transition Care Management Follow-up Telephone Call Date of discharge and from where: 05/31/2021  [facility] How have you been since you were released from the hospital? " Feeling ok" Any questions or concerns? No  Items Reviewed: Did the pt receive and understand the discharge instructions provided? Yes  Medications obtained and verified? Yes  Other? No  Any new allergies since your discharge? No  Dietary orders reviewed? Yes Do you have support at home? Yes   Home Care and Equipment/Supplies: Were home health services ordered? no If so, what is the name of the agency?  Has the agency set up a time to come to the patient's home? not applicable Were any new equipment or medical supplies ordered?  No What is the name of the medical supply agency?  Were you able to get the supplies/equipment? not applicable Do you have any questions related to the use of the equipment or supplies? No  Functional Questionnaire: (I = Independent and D = Dependent) ADLs: I  Bathing/Dressing- I  Meal Prep- I  Eating- I  Maintaining continence- I  Transferring/Ambulation- I  Managing Meds- I  Follow up appointments reviewed:  PCP Hospital f/u appt confirmed?  No  Specialist Hospital f/u appt confirmed? Yes  Scheduled to see  Coletta Memos on 06/06/2021 @ 1445. Are transportation arrangements needed? No  If their condition worsens, is the pt aware to call PCP or go to the Emergency Dept.? Yes Was the patient provided with contact information for the PCP's office or ED? Yes Was to pt encouraged to call back with questions or concerns? Yes  Tomasa Rand, RN, BSN, CEN [facility] [employer] [phone]

## 2021-06-02 NOTE — Telephone Encounter (Signed)
Please call patient to schedule hospital follow up with Dr Lajuana Ripple. Was in the hospital because he had to have 3 stents put in. Was discharged on 05/31/21.

## 2021-06-02 NOTE — Telephone Encounter (Signed)
Hospital follow up scheduled for 06/05/21 with Dr. Lajuana Ripple.

## 2021-06-05 ENCOUNTER — Encounter: Payer: Self-pay | Admitting: Family Medicine

## 2021-06-05 ENCOUNTER — Other Ambulatory Visit: Payer: Self-pay

## 2021-06-05 ENCOUNTER — Ambulatory Visit (INDEPENDENT_AMBULATORY_CARE_PROVIDER_SITE_OTHER): Payer: Medicare Other | Admitting: Family Medicine

## 2021-06-05 VITALS — BP 117/76 | HR 92 | Temp 97.4°F | Ht 74.0 in | Wt 227.2 lb

## 2021-06-05 DIAGNOSIS — K922 Gastrointestinal hemorrhage, unspecified: Secondary | ICD-10-CM | POA: Diagnosis not present

## 2021-06-05 DIAGNOSIS — R6889 Other general symptoms and signs: Secondary | ICD-10-CM | POA: Diagnosis not present

## 2021-06-05 DIAGNOSIS — Z09 Encounter for follow-up examination after completed treatment for conditions other than malignant neoplasm: Secondary | ICD-10-CM

## 2021-06-05 DIAGNOSIS — E039 Hypothyroidism, unspecified: Secondary | ICD-10-CM

## 2021-06-05 DIAGNOSIS — F418 Other specified anxiety disorders: Secondary | ICD-10-CM

## 2021-06-05 DIAGNOSIS — Z9861 Coronary angioplasty status: Secondary | ICD-10-CM

## 2021-06-05 LAB — VERITOR FLU A/B WAIVED
Influenza A: NEGATIVE
Influenza B: NEGATIVE

## 2021-06-05 MED ORDER — ALPRAZOLAM 0.25 MG PO TABS: 0.2500 mg | ORAL_TABLET | Freq: Every evening | ORAL | 0 refills | Status: DC | PRN

## 2021-06-05 NOTE — Progress Notes (Signed)
Cardiology Clinic Note   Patient Name: Travis Padilla Date of Encounter: 06/06/2021  Primary Care Provider:  [name], DO Primary Cardiologist:  [name], MD  Patient Profile    Travis Padilla 75 year old male presents the clinic today for follow-up evaluation of his unstable angina.  He underwent LHC 05/25/2021 which showed 80% mid LAD (chronic total occlusion) and sequential 70-95% proximal mid RCA lesion.  He received DES x1 to his LAD.  He had staged PCI 05/26/2021 with successful coronary arthrectomy and stenting of high-grade calcified lesions of proximal-mid RCA.  Past Medical History    Past Medical History:  Diagnosis Date   Abnormal nuclear stress test    December, 2013   Anemia    Axillary adenopathy 04/24/2018   CAD (coronary artery disease)    Mild nonobstructive plaque in cath 2013   Chest pain    December, 2013   Cough with hemoptysis 04/22/2018   Dizziness    Dyslipidemia 07/14/2019   Encounter for antineoplastic immunotherapy 05/13/2018   Gout    Hemorrhoid    Hyperlipidemia    Hypertension    Hypothyroidism (acquired) 08/11/2018   Metastasis to lung ([place]) 05/19/2018   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) ([place]) dx'd 04/17/18   to LN, infrahilar mass, lung nodule and lt axilla   Obesity, unspecified 03/28/2009   Qualifier: Diagnosis of  By: [name], MD, [name]     Palpitations 03/28/2009   Qualifier: Diagnosis of  By: [name], MD, [name]     Right lower lobe lung mass 04/22/2018   Skin cancer    Sleep apnea    SNORING 03/28/2009   Qualifier: Diagnosis of  By: [name], MD, [name]     Spinal stenosis    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   Past Surgical History:  Procedure Laterality Date   basal skin cancer N/A 2019   Nose   BELPHAROPTOSIS REPAIR Bilateral    CARDIAC CATHETERIZATION     CATARACT EXTRACTION W/ INTRAOCULAR LENS  IMPLANT, BILATERAL     COLONOSCOPY N/A  11/03/2014   Procedure: COLONOSCOPY;  Surgeon: Rogene Houston, MD;  [facility];  Service: Endoscopy;  Laterality: N/A;  1225   CORONARY ATHERECTOMY N/A 05/26/2021   Procedure: CORONARY ATHERECTOMY;  Surgeon: Early Osmond, MD;  Location: [place] CV LAB;  Service: Cardiovascular;  Laterality: N/A;   CORONARY STENT INTERVENTION N/A 05/25/2021   Procedure: CORONARY STENT INTERVENTION;  Surgeon: Nelva Bush, MD;  Location: [place] CV LAB;  Service: Cardiovascular;  Laterality: N/A;   CORONARY STENT INTERVENTION N/A 05/26/2021   Procedure: CORONARY STENT INTERVENTION;  Surgeon: Early Osmond, MD;  Location: [place] CV LAB;  Service: Cardiovascular;  Laterality: N/A;   ESOPHAGOGASTRODUODENOSCOPY (EGD) WITH PROPOFOL N/A 05/29/2021   Procedure: ESOPHAGOGASTRODUODENOSCOPY (EGD) WITH PROPOFOL;  Surgeon: Gatha Mayer, MD;  Location: [place];  Service: Endoscopy;  Laterality: N/A;   INTRAVASCULAR ULTRASOUND/IVUS N/A 05/26/2021   Procedure: Intravascular Ultrasound/IVUS;  Surgeon: Early Osmond, MD;  Location: [place] CV LAB;  Service: Cardiovascular;  Laterality: N/A;   IR CV LINE INJECTION  08/24/2020   IR FLUORO GUIDED NEEDLE PLC ASPIRATION/INJECTION LOC  11/28/2020   IR IMAGING GUIDED PORT INSERTION  07/11/2018   KNEE CARTILAGE SURGERY Left    Left knee   LEFT HEART CATH AND CORONARY ANGIOGRAPHY N/A 05/25/2021   Procedure: LEFT HEART CATH AND CORONARY ANGIOGRAPHY;  Surgeon: End,  Harrell Gave, MD;  Location: [place] CV LAB;  Service: Cardiovascular;  Laterality: N/A;   SKIN CANCER EXCISION  12/2014, 04/25/15   TEMPORARY PACEMAKER N/A 05/26/2021   Procedure: TEMPORARY PACEMAKER;  Surgeon: Early Osmond, MD;  Location: [place] CV LAB;  Service: Cardiovascular;  Laterality: N/A;   VIDEO BRONCHOSCOPY Bilateral 05/09/2018   Procedure: VIDEO BRONCHOSCOPY WITH FLUORO;  Surgeon: Juanito Doom, MD;  Location: [facility];  Service: Cardiopulmonary;   Laterality: Bilateral;    Allergies  Allergies  Allergen Reactions   Pravastatin     Numbness in feet    Iodine Hives and Rash    CT scan contrast caused rash and itching     History of Present Illness    Travis Padilla 75 year old male has a PMH of essential hypertension, HLD, thrombocytopenia, GERD, and acute blood loss anemia.  His PMH also includes stage IV squamous cell carcinoma (treated with carrboplatin, paclitaxel, Keytruda, radiation), and OSA.  He presented to the office with chest discomfort.  He underwent chest CT which demonstrated aortic atherosclerosis and coronary artery calcification.  His exercise treadmill stress test demonstrated no high risk findings.  He presented to the emergency department with chest pain just prior to his office visit.  He reported that he had started using his CPAP.  After he put it on he reported he had an episode of shortness of breath and chest/neck tightness.  He felt as if he was suffocating.  He presented to the emergency department at [facility] long after having a subsequent episode discomfort post back injection.  At that time he did not wait to be seen.  His cardiac enzymes were negative.  He was encouraged to return to the emergency department and went to draw bridge.  A new right bundle branch block was noted when compared to previous EKGs.  His D-dimer was unremarkable.  His cardiac enzymes were again unremarkable.  He was discharged from the emergency room.  He again described discomfort and dull tightness in his chest which radiated to his neck.  His symptoms were happening at rest.  He did not describe shortness of breath PND or orthopnea.  He denied palpitations presyncope and syncope.  Outpatient cardiac catheterization was scheduled.He underwent LHC 05/25/2021 which showed 80% mid LAD (chronic total occlusion) and sequential 70-95% proximal mid RCA lesion.  He received DES x1 to his LAD.  He had staged PCI 05/26/2021 with successful coronary  arthrectomy and stenting of high-grade calcified lesions of proximal-mid RCA.  He was noted to have anemia due to GI bleed.  His hemoglobin went from 12 down to 7.9 on 9/17.  He was noted to be FOBT positive.  His dual antiplatelet therapy was briefly held in setting of concern for cardiogenic shock.  He was bridged with cangrelor.  He received 3 units of PRBCs.  He was evaluated by GI and underwent EGD 919 which showed erosive gastropathy with no active bleeding.  It was felt that it was his prior source of melena.  His H. pylori was negative.  He received IV iron prior to discharge.  Supplemental iron was added to his medication regimen.  Recommendations to continue on antiplatelet therapy were made.  He was continued on Plavix without aspirin.  Recommendation for Protonix 40 mg daily was also made.  He followed up with [facility] 06/05/2021.  His hemoglobin at discharge was 8.7.  He denied any hemoptysis but reported some scant rectal bleeding.  CBC was ordered.  It was recommended that he follow-up with GI.  His Xanax was renewed due to increased anxiety relating to both his and his wife's current health issues.  He presents to the clinic today for follow-up evaluation states he continues to notice some increased work of breathing with increased physical activity.  We discussed how some of this may be related to his lung cancer.  He does feel like he has more energy with improved hemoglobin.  There is some question as to whether he will undergo colonoscopy for his recent scant red blood in his stool.  He reports compliance with his Plavix.  He continues to use his CPAP and work with sleep medicine for CPAP titration.  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  He is walking 30-40 minutes/day and plans to return to the gym to do yoga and light weights.  I will reach out to Dr. [name] to discuss whether we need to wait for  hemoglobin to further elevate and wait till after colonoscopy is complete to reinitiate DAPT.  We will start carvedilol 3.125 mg twice daily and plan follow-up for 1 month.  Today he denies chest pain, increased shortness of breath, lower extremity edema, fatigue, palpitations, melena, hematuria, hemoptysis, diaphoresis, weakness, presyncope, syncope, orthopnea, and PND.   Home Medications    Prior to Admission medications   Medication Sig Start Date End Date Taking? Authorizing Provider  acetaminophen (TYLENOL) 500 MG tablet Take 1,000 mg by mouth every 6 (six) hours as needed.    [provider]  albuterol (VENTOLIN HFA) 108 (90 Base) MCG/ACT inhaler Inhale 2 puffs into the lungs every 6 (six) hours as needed for wheezing or shortness of breath. 04/04/21   Icard, Octavio Graves, DO  ALPRAZolam (XANAX) 0.25 MG tablet Take 1 tablet (0.25 mg total) by mouth at bedtime as needed for anxiety. 10/19/20   Curt Bears, MD  Artificial Tear Solution (SOOTHE XP OP) Place 1 drop into both eyes 2 (two) times daily.    [provider]  clopidogrel (PLAVIX) 75 MG tablet Take 1 tablet (75 mg total) by mouth daily. 06/01/21   Cheryln Manly, NP  fluticasone (FLONASE) 50 MCG/ACT nasal spray Place 1 spray into both nostrils daily. 07/01/20   Lauraine Rinne, NP  Fluticasone-Umeclidin-Vilant (TRELEGY ELLIPTA) 100-62.5-25 MCG/INH AEPB Inhale 1 puff into the lungs daily. 03/20/21   Icard, Octavio Graves, DO  Iron, Ferrous Sulfate, 325 (65 Fe) MG TABS Take 325 mg by mouth daily. 05/31/21   Cheryln Manly, NP  levothyroxine (SYNTHROID) 150 MCG tablet Take 1 tablet (150 mcg total) by mouth daily before breakfast. 05/01/21   Heilingoetter, Cassandra L, PA-C  methocarbamol (ROBAXIN) 500 MG tablet Take 500 mg by mouth daily. 08/11/19   [provider]  nitroGLYCERIN (NITROSTAT) 0.4 MG SL tablet Place 1 tablet (0.4 mg total) under the tongue every 5 (five) minutes as needed for chest pain. 05/31/21    Cheryln Manly, NP  pantoprazole (PROTONIX) 40 MG tablet Take 1 tablet (40 mg total) by mouth daily before breakfast. 06/01/21   Reino Bellis B, NP  polyethylene glycol powder (GLYCOLAX/MIRALAX) 17 GM/SCOOP powder Take 1 Container by mouth once.    [provider]  PRALUENT 150 MG/ML SOAJ INJECT 150 MG INTO THE SKIN EVERY 14 (FOURTEEN) DAYS. 04/24/21   [name], MD  senna-docusate (SENNA S) 8.6-50 MG tablet 1 to 2 twice daily for constipation 07/07/18   Tanner,  Lyndon Code., PA-C  tamsulosin (FLOMAX) 0.4 MG CAPS capsule Take 0.4 mg by mouth at bedtime. 09/29/20   [provider]    Family History    Family History  Problem Relation Age of Onset   Heart attack Father    Heart failure Mother    Parkinson's disease Mother    Healthy Sister    Skin cancer Sister    Neuropathy Brother    COPD Brother    Epilepsy Brother    Healthy Sister    Healthy Sister    Colon cancer Neg Hx    He indicated that his mother is deceased. He indicated that his father is deceased. He indicated that all of his three sisters are alive. He indicated that his brother is alive. He indicated that his maternal grandmother is deceased. He indicated that his maternal grandfather is deceased. He indicated that his paternal grandmother is deceased. He indicated that his paternal grandfather is deceased. He indicated that the status of his neg hx is unknown.  Social History    Social History   Socioeconomic History   Marital status: Married    Spouse name: Not on file   Number of children: 1   Years of education: Not on file   Highest education level: Not on file  Occupational History   Occupation: Government social research officer  Tobacco Use   Smoking status: Former    Packs/day: 0.20    Years: 2.00    Pack years: 0.40    Types: Cigarettes    Start date: 10/01/1968    Quit date: 1972    Years since quitting: 50.7   Smokeless tobacco: Never  Vaping Use   Vaping Use: Never used  Substance and  Sexual Activity   Alcohol use: No    Comment: hx of    Drug use: No   Sexual activity: Not on file  Other Topics Concern   Not on file  Social History Narrative   Unable to ask intimate partner violence questions, wife present   Social Determinants of Health   Financial Resource Strain: Not on file  Food Insecurity: Not on file  Transportation Needs: Not on file  Physical Activity: Not on file  Stress: Not on file  Social Connections: Not on file  Intimate Partner Violence: Not on file     Review of Systems    General:  No chills, fever, night sweats or weight changes.  Cardiovascular:  No chest pain, dyspnea on exertion, edema, orthopnea, palpitations, paroxysmal nocturnal dyspnea. Dermatological: No rash, lesions/masses Respiratory: No cough, dyspnea Urologic: No hematuria, dysuria Abdominal:   No nausea, vomiting, diarrhea, bright red blood per rectum, melena, or hematemesis Neurologic:  No visual changes, wkns, changes in mental status. All other systems reviewed and are otherwise negative except as noted above.  Physical Exam    VS:  BP 122/78 (BP Location: Left Arm, Patient Position: Sitting, Cuff Size: Normal)   Pulse 86   Ht 6\' 2"  (1.88 m)   Wt 228 lb 6.4 oz (103.6 kg)   SpO2 98%   BMI 29.32 kg/m  , BMI Body mass index is 29.32 kg/m. GEN: Well nourished, well developed, in no acute distress. HEENT: normal. Neck: Supple, no JVD, carotid bruits, or masses. Cardiac: RRR, no murmurs, rubs, or gallops. No clubbing, cyanosis, edema.  Radials/DP/PT 2+ and equal bilaterally.  Respiratory:  Respirations regular and unlabored, clear to auscultation bilaterally. GI: Soft, nontender, nondistended, BS + x 4. MS: no deformity or atrophy. Skin:  warm and dry, no rash. Neuro:  Strength and sensation are intact. Psych: Normal affect.  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 Left ventricular ejection fraction, by estimation, is 50 to 55%. The  left ventricle has low normal function. The left ventricle has no regional  wall motion abnormalities. There is mild concentric left ventricular  hypertrophy. Indeterminate diastolic  filling due to E-A fusion.   2. Right ventricular systolic function is normal. The right ventricular  size is normal. Tricuspid regurgitation signal is inadequate for assessing  PA pressure.   3. The mitral valve is grossly normal. No evidence of mitral valve  regurgitation. No evidence of mitral stenosis.   4. The aortic valve is tricuspid. Aortic valve regurgitation is trivial.  No aortic stenosis is present.   5. The inferior vena cava is normal in size with greater than 50%  respiratory variability, suggesting right atrial pressure of 3 mmHg.   Comparison(s): No significant change from prior study.   Cardiac catheterization 05/25/2021 Conclusions: Severe two-vessel coronary artery disease including 80% mid LAD stenosis, chronic total occlusion of moderate-caliber D1, and sequential 70-95% proximal-mid RCA lesions with severe calcification. Low-normal left ventricular systolic function (LVEF 12-45%) with normal filling pressure (LVEDP 5-10 mmHg). Successful PCI to mid LAD using Onyx Frontier 3.0 x 22 mm drug-eluting stent with 0% residual stenosis and TIMI-3 flow   Recommendations: Plan for staged PCI to proximal/mid RCA with atherectomy as soon as  tomorrow if renal function allows. Continue dual antiplatelet therapy with aspirin and ticagrelor for at least 12 months. Aggressive secondary prevention.   Nelva Bush, MD [facility]  Diagnostic Dominance: Right Intervention    Cardiac catheterization 05/26/2021    Prox RCA-1 lesion is 95% stenosed.   Prox RCA-2 lesion is 70% stenosed.   Mid RCA lesion is 80% stenosed.   A stent was successfully placed.   A stent was successfully placed.   Post intervention, there is a 0% residual stenosis.   Post intervention, there is a 0% residual stenosis.   Post intervention, there is a 0% residual stenosis.   1.  Successful stenting of high-grade calcified serial lesions of the proximal and mid right coronary artery with 2 overlapping drug-eluting stents with IVUS optimization.   Recommendation: The patient could should continue dual antiplatelet therapy for at least 6 months and aggressive cardiovascular risk factor modification.   Diagnostic Dominance: Right Intervention    Assessment & Plan   1.  Coronary artery disease/unstable angina-denies chest pain today.  Staged PCI with [facility] 05/25/2021 which showed 80% mid LAD (chronic total occlusion) and sequential 70-95% proximal mid RCA lesion.  He received DES x1 to his LAD.  Also with PCI 05/26/2021 with successful coronary arthrectomy and stenting of high-grade calcified lesions of proximal-mid RCA.  Noted to have previous GI bleed on EGD.  Aspirin discontinued.  Follow-up with GI, Dr. Carlean Purl Continue  Plavix, nitroglycerin as needed, Praluent Heart healthy low-sodium diet-salty 6 given Increase physical activity as tolerated  Hyperlipidemia-05/24/2021: Cholesterol, Total 123; HDL 38; LDL Chol Calc (NIH) 54; Triglycerides 191 Continue Praluent Heart healthy low-sodium high-fiber diet Increase physical activity as tolerated  Essential hypertension-BP today 122/78.  Noted to have developed transient hypotension in the setting of  GI bleed. Heart healthy low-sodium diet-salty 6 given Increase physical activity as tolerated  Anemia-followed up with family medicine 06/05/2021.  Denies hemoptysis but has noted scant rectal bleeding.  Status post 3 units PRBCs, iron infusion, and continues on p.o. iron.  Repeat hemoglobin 10.0 Follow-up with family medicine  COPD-reports compliance withTrelegy.  OSA managed by Dr.Athar.  Disposition: Follow-up with Dr. [name] or me in 1 month.   Jossie Ng. Maisen Klingler NP-C    06/06/2021, 4:22 PM [facility] [place] [address] Office [phone] Fax [phone]  Notice: This dictation was prepared with Dragon dictation along with smaller phrase technology. Any transcriptional errors that result from this process are unintentional and may not be corrected upon review.  I spent 15 minutes examining this patient, reviewing medications, and using patient centered shared decision making involving her cardiac care.  Prior to her visit I spent greater than 20 minutes reviewing her past medical history,  medications, and prior cardiac tests.

## 2021-06-05 NOTE — Patient Instructions (Signed)
We discussed the dangers of your medication today.  Xanax can cause hallucinations, falls, breathing problems, dementia and even death.  Use SPARINGLY

## 2021-06-05 NOTE — Progress Notes (Signed)
Subjective: CC:Hospital follow up PCP: [name], DO LNL:GXQJ Jerilynn Mages [place] is a 75 y.o. male presenting to clinic today for:  1. Unstable angina/ s/p PTCA Patient had a few episodes of angina prior to outpatient coronary catheterization.  On that catheterization he was noted to have "Conclusions: Severe two-vessel coronary artery disease including 80% mid LAD stenosis, chronic total occlusion of moderate-caliber D1, and sequential 70-95% proximal-mid RCA lesions with severe calcification. Low-normal left ventricular systolic function (LVEF 19-41%) with normal filling pressure (LVEDP 5-10 mmHg). Successful PCI to mid LAD using Onyx Frontier 3.0 x 22 mm drug-eluting stent with 0% residual stenosis and TIMI-3 flow".  He subsequently underwent stent placement.  Initially started on dual antiplatelet therapy with aspirin and Brilinta but developed a GI bleed, FOBT positive.  He was placed on monotherapy instead with Plavix and advised to avoid NSAIDs/aspirin.  He was also placed on PPI and oral iron.  Had an iron infusion during hospitalization as well.  At discharge his hemoglobin was 8.7. follow up with cardiology scheduled for tomorrow.   Reports feeling somewhat dyspneic on exertion.  He has had some associated nasal congestion, productive cough.  These onset after hospitalization.  Denies any hemoptysis but has seen some scant rectal bleeding.  Reports increased anxiety surrounding both he and his wife's health.  Had some leftover alprazolam from his oncologist and wants to know if this is something I would be willing to renew for him.  He has not yet seen gastroenterology for follow-up but notes that Dr. Melony Overly will be retiring after the new year and asked that he establish with Dr. Carlean Purl.  He was really impressed with him in the hospital   ROS: Per HPI  Allergies  Allergen Reactions   Pravastatin     Numbness in feet    Iodine Hives and Rash    CT scan contrast caused rash and  itching    Past Medical History:  Diagnosis Date   Abnormal nuclear stress test    December, 2013   Anemia    Axillary adenopathy 04/24/2018   CAD (coronary artery disease)    Mild nonobstructive plaque in cath 2013   Chest pain    December, 2013   Cough with hemoptysis 04/22/2018   Dizziness    Dyslipidemia 07/14/2019   Encounter for antineoplastic immunotherapy 05/13/2018   Gout    Hemorrhoid    Hyperlipidemia    Hypertension    Hypothyroidism (acquired) 08/11/2018   Metastasis to lung ([place]) 05/19/2018   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) ([place]) dx'd 04/17/18   to LN, infrahilar mass, lung nodule and lt axilla   Obesity, unspecified 03/28/2009   Qualifier: Diagnosis of  By: [name], MD, [name]     Palpitations 03/28/2009   Qualifier: Diagnosis of  By: [name], MD, [name]     Right lower lobe lung mass 04/22/2018   Skin cancer    Sleep apnea    SNORING 03/28/2009   Qualifier: Diagnosis of  By: [name], MD, [name]     Spinal stenosis    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    Current Outpatient Medications:    acetaminophen (TYLENOL) 500 MG tablet, Take 1,000 mg by mouth every 6 (six) hours as needed., Disp: , Rfl:    albuterol (VENTOLIN HFA) 108 (90 Base) MCG/ACT inhaler, Inhale 2 puffs into the lungs every 6 (six) hours as needed for wheezing or shortness of breath., Disp:  8 g, Rfl: 6   ALPRAZolam (XANAX) 0.25 MG tablet, Take 1 tablet (0.25 mg total) by mouth at bedtime as needed for anxiety., Disp: 30 tablet, Rfl: 0   Artificial Tear Solution (SOOTHE XP OP), Place 1 drop into both eyes 2 (two) times daily., Disp: , Rfl:    clopidogrel (PLAVIX) 75 MG tablet, Take 1 tablet (75 mg total) by mouth daily., Disp: 90 tablet, Rfl: 2   fluticasone (FLONASE) 50 MCG/ACT nasal spray, Place 1 spray into both nostrils daily., Disp: 16 g, Rfl: 3   Fluticasone-Umeclidin-Vilant (TRELEGY ELLIPTA) 100-62.5-25 MCG/INH AEPB,  Inhale 1 puff into the lungs daily., Disp: 60 each, Rfl: 6   Iron, Ferrous Sulfate, 325 (65 Fe) MG TABS, Take 325 mg by mouth daily., Disp: 30 tablet, Rfl: 2   levothyroxine (SYNTHROID) 150 MCG tablet, Take 1 tablet (150 mcg total) by mouth daily before breakfast., Disp: 30 tablet, Rfl: 2   methocarbamol (ROBAXIN) 500 MG tablet, Take 500 mg by mouth daily., Disp: , Rfl:    nitroGLYCERIN (NITROSTAT) 0.4 MG SL tablet, Place 1 tablet (0.4 mg total) under the tongue every 5 (five) minutes as needed for chest pain., Disp: 25 tablet, Rfl: 2   pantoprazole (PROTONIX) 40 MG tablet, Take 1 tablet (40 mg total) by mouth daily before breakfast., Disp: 90 tablet, Rfl: 0   polyethylene glycol powder (GLYCOLAX/MIRALAX) 17 GM/SCOOP powder, Take 1 Container by mouth once., Disp: , Rfl:    PRALUENT 150 MG/ML SOAJ, INJECT 150 MG INTO THE SKIN EVERY 14 (FOURTEEN) DAYS., Disp: 6 mL, Rfl: 3   senna-docusate (SENNA S) 8.6-50 MG tablet, 1 to 2 twice daily for constipation, Disp: 120 tablet, Rfl: 5   tamsulosin (FLOMAX) 0.4 MG CAPS capsule, Take 0.4 mg by mouth at bedtime., Disp: , Rfl:  Social History   Socioeconomic History   Marital status: Married    Spouse name: Not on file   Number of children: 1   Years of education: Not on file   Highest education level: Not on file  Occupational History   Occupation: Government social research officer  Tobacco Use   Smoking status: Former    Packs/day: 0.20    Years: 2.00    Pack years: 0.40    Types: Cigarettes    Start date: 10/01/1968    Quit date: 1972    Years since quitting: 50.7   Smokeless tobacco: Never  Vaping Use   Vaping Use: Never used  Substance and Sexual Activity   Alcohol use: No    Comment: hx of    Drug use: No   Sexual activity: Not on file  Other Topics Concern   Not on file  Social History Narrative   Unable to ask intimate partner violence questions, wife present   Social Determinants of Health   Financial Resource Strain: Not on file  Food  Insecurity: Not on file  Transportation Needs: Not on file  Physical Activity: Not on file  Stress: Not on file  Social Connections: Not on file  Intimate Partner Violence: Not on file   Family History  Problem Relation Age of Onset   Heart attack Father    Heart failure Mother    Parkinson's disease Mother    Healthy Sister    Skin cancer Sister    Neuropathy Brother    COPD Brother    Epilepsy Brother    Healthy Sister    Healthy Sister    Colon cancer Neg Hx     Objective: Office vital  signs reviewed. BP 117/76   Pulse 92   Temp (!) 97.4 F (36.3 C)   Ht 6\' 2"  (1.88 m)   Wt 227 lb 3.2 oz (103.1 kg)   SpO2 98%   BMI 29.17 kg/m   Physical Examination:  General: Awake, alert, pale appearing, No acute distress HEENT: Normal; sclera white Cardio: regular rate and rhythm, S1S2 heard, no murmurs appreciated Pulm: clear to auscultation bilaterally, no wheezes, rhonchi or rales; normal work of breathing on room air MSK: normal gait and station  Assessment/ Plan: 75 y.o. male   Hospital discharge follow-up - Plan: CBC  Acute GI bleeding - Plan: CBC  S/P PTCA (percutaneous transluminal coronary angioplasty)  Flu-like symptoms - Plan: Novel Coronavirus, NAA ([facility]), Veritor Flu A/B Waived  Situational anxiety - Plan: ALPRAZolam (XANAX) 0.25 MG tablet  Hypothyroidism (acquired) - Plan: TSH, T4, Free  Check CBC.  Anticipate increase in his hemoglobin.  We will also CC gastroenterology.  I think that it is worth him following up to make sure that he does not have an ongoing GI bleed.  I am concerned that he saw some scant hematochezia recently despite treatments.  I reviewed his discharge information.  He is to keep follow-up with cardiology tomorrow  For his situational anxiety Xanax has been renewed.  Discussed risks of this medication.  The national narcotic database was reviewed.  If ongoing need for this we will plan for controlled substance contract and urine  drug screen  I have ordered repeat TSH and free T4 as well as these levels were abnormal upon last recheck by his outside provider.  For his nasal congestion and feeling like his breathing is somewhat bothersome, influenza and COVID-19 testing were performed.  Influenza was negative  No orders of the defined types were placed in this encounter.  No orders of the defined types were placed in this encounter.   Today's visit is for Transitional Care Management.  The patient was discharged from [facility] on [facility]21 with a primary diagnosis of unstable angina/ s/o PTCA.   Contact with the patient and/or caregiver, by a clinical staff member, was made on 06/02/21 and was documented as a telephone encounter within the EMR.  Through chart review and discussion with the patient I have determined that management of their condition is of moderate complexity.    [name], DO [place] [phone]

## 2021-06-06 ENCOUNTER — Telehealth (HOSPITAL_COMMUNITY): Payer: Self-pay

## 2021-06-06 ENCOUNTER — Ambulatory Visit (INDEPENDENT_AMBULATORY_CARE_PROVIDER_SITE_OTHER): Payer: Medicare Other | Admitting: General Practice

## 2021-06-06 ENCOUNTER — Encounter: Payer: Self-pay | Admitting: General Practice

## 2021-06-06 VITALS — BP 122/78 | HR 86 | Ht 74.0 in | Wt 228.4 lb

## 2021-06-06 DIAGNOSIS — R0600 Dyspnea, unspecified: Secondary | ICD-10-CM

## 2021-06-06 DIAGNOSIS — I2510 Atherosclerotic heart disease of native coronary artery without angina pectoris: Secondary | ICD-10-CM

## 2021-06-06 DIAGNOSIS — I10 Essential (primary) hypertension: Secondary | ICD-10-CM | POA: Diagnosis not present

## 2021-06-06 DIAGNOSIS — E782 Mixed hyperlipidemia: Secondary | ICD-10-CM | POA: Diagnosis not present

## 2021-06-06 DIAGNOSIS — D649 Anemia, unspecified: Secondary | ICD-10-CM

## 2021-06-06 DIAGNOSIS — R0609 Other forms of dyspnea: Secondary | ICD-10-CM

## 2021-06-06 LAB — CBC
Hematocrit: 30.1 % — ABNORMAL LOW (ref 37.5–51.0)
Hemoglobin: 10 g/dL — ABNORMAL LOW (ref 13.0–17.7)
MCH: 30.6 pg (ref 26.6–33.0)
MCHC: 33.2 g/dL (ref 31.5–35.7)
MCV: 92 fL (ref 79–97)
Platelets: 264 10*3/uL (ref 150–450)
RBC: 3.27 x10E6/uL — ABNORMAL LOW (ref 4.14–5.80)
RDW: 15.1 % (ref 11.6–15.4)
WBC: 7.1 10*3/uL (ref 3.4–10.8)

## 2021-06-06 LAB — T4, FREE: Free T4: 1.38 ng/dL (ref 0.82–1.77)

## 2021-06-06 LAB — SARS-COV-2, NAA 2 DAY TAT

## 2021-06-06 LAB — TSH: TSH: 0.472 u[IU]/mL (ref 0.450–4.500)

## 2021-06-06 LAB — NOVEL CORONAVIRUS, NAA: SARS-CoV-2, NAA: NOT DETECTED

## 2021-06-06 MED ORDER — CARVEDILOL 3.125 MG PO TABS: 3.1250 mg | ORAL_TABLET | Freq: Two times a day (BID) | ORAL | 3 refills | Status: DC

## 2021-06-06 NOTE — Patient Instructions (Signed)
Medication Instructions:  START CARVEDILOL 3.125 TWICE DAILY  *If you need a refill on your cardiac medications before your next appointment, please call your pharmacy*  Lab Work:   Testing/Procedures:  NONE    NONE  Special Instructions PLEASE READ AND FOLLOW SALTY 6-ATTACHED-1,800mg  daily  PLEASE MAINTAIN BLOOD PRESSURE LOG  MAINTAIN WALKING  Follow-Up: Your next appointment:  1 month(s) In Person with [name], MD OR IF UNAVAILABLE [place], FNP-C   At [facility], you and your health needs are our priority.  As part of our continuing mission to provide you with exceptional heart care, we have created designated Provider Care Teams.  These Care Teams include your primary Cardiologist (physician) and Advanced Practice Providers (APPs -  Physician Assistants and Nurse Practitioners) who all work together to provide you with the care you need, when you need it.            6 SALTY THINGS TO AVOID     1,800MG  DAILY

## 2021-06-06 NOTE — Telephone Encounter (Signed)
Called patient to see if he is interested in the Cardiac Rehab Program. Patient expressed interest. Explained scheduling process and went over insurance, patient verbalized understanding. Will contact patient for scheduling once f/u has been completed.  °

## 2021-06-06 NOTE — Telephone Encounter (Signed)
Pt insurance is active and benefits verified through Medicare a/b Co-pay 0, DED $233/$233 met, out of pocket 0/0 met, co-insurance 20%. no pre-authorization required. Passport, 06/06/2021_0 :34am, REF# [id number]   2ndary insurance is active and benefits verified through [place]. Co-pay 0, DED 0/0 met, out of pocket 0/0 met, co-insurance 0%. No pre-authorization required. Passport, 06/06/2021_1 :40am, REF# [id number]     Will contact patient to see if he is interested in the Cardiac Rehab Program. If interested, patient will need to complete follow up appt. Once completed, patient will be contacted for scheduling upon review by the RN Navigator.

## 2021-06-07 ENCOUNTER — Encounter: Payer: Self-pay | Admitting: Family Medicine

## 2021-06-07 ENCOUNTER — Other Ambulatory Visit: Payer: Self-pay | Admitting: Family Medicine

## 2021-06-07 DIAGNOSIS — N4889 Other specified disorders of penis: Secondary | ICD-10-CM | POA: Diagnosis not present

## 2021-06-07 DIAGNOSIS — R3912 Poor urinary stream: Secondary | ICD-10-CM | POA: Diagnosis not present

## 2021-06-07 DIAGNOSIS — N401 Enlarged prostate with lower urinary tract symptoms: Secondary | ICD-10-CM | POA: Diagnosis not present

## 2021-06-07 DIAGNOSIS — N452 Orchitis: Secondary | ICD-10-CM | POA: Diagnosis not present

## 2021-06-07 DIAGNOSIS — N50811 Right testicular pain: Secondary | ICD-10-CM | POA: Diagnosis not present

## 2021-06-07 DIAGNOSIS — N390 Urinary tract infection, site not specified: Secondary | ICD-10-CM | POA: Diagnosis not present

## 2021-06-07 DIAGNOSIS — K922 Gastrointestinal hemorrhage, unspecified: Secondary | ICD-10-CM

## 2021-06-10 ENCOUNTER — Emergency Department (HOSPITAL_COMMUNITY)
Admission: EM | Admit: 2021-06-10 | Discharge: 2021-06-10 | Disposition: A | Discharge status: Home / Self Care | Payer: Medicare Other | Attending: Emergency Medicine | Admitting: Emergency Medicine

## 2021-06-10 ENCOUNTER — Emergency Department (HOSPITAL_COMMUNITY): Payer: Medicare Other

## 2021-06-10 ENCOUNTER — Other Ambulatory Visit: Payer: Self-pay

## 2021-06-10 ENCOUNTER — Encounter (HOSPITAL_COMMUNITY): Payer: Self-pay | Admitting: Emergency Medicine

## 2021-06-10 DIAGNOSIS — Z20822 Contact with and (suspected) exposure to covid-19: Secondary | ICD-10-CM | POA: Diagnosis not present

## 2021-06-10 DIAGNOSIS — I2510 Atherosclerotic heart disease of native coronary artery without angina pectoris: Secondary | ICD-10-CM | POA: Insufficient documentation

## 2021-06-10 DIAGNOSIS — R059 Cough, unspecified: Secondary | ICD-10-CM | POA: Insufficient documentation

## 2021-06-10 DIAGNOSIS — I10 Essential (primary) hypertension: Secondary | ICD-10-CM | POA: Insufficient documentation

## 2021-06-10 DIAGNOSIS — A419 Sepsis, unspecified organism: Secondary | ICD-10-CM

## 2021-06-10 DIAGNOSIS — Z85118 Personal history of other malignant neoplasm of bronchus and lung: Secondary | ICD-10-CM | POA: Insufficient documentation

## 2021-06-10 DIAGNOSIS — Z79899 Other long term (current) drug therapy: Secondary | ICD-10-CM | POA: Diagnosis not present

## 2021-06-10 DIAGNOSIS — Z7902 Long term (current) use of antithrombotics/antiplatelets: Secondary | ICD-10-CM | POA: Diagnosis not present

## 2021-06-10 DIAGNOSIS — E039 Hypothyroidism, unspecified: Secondary | ICD-10-CM | POA: Diagnosis not present

## 2021-06-10 DIAGNOSIS — R519 Headache, unspecified: Secondary | ICD-10-CM | POA: Diagnosis not present

## 2021-06-10 DIAGNOSIS — R0689 Other abnormalities of breathing: Secondary | ICD-10-CM | POA: Diagnosis not present

## 2021-06-10 DIAGNOSIS — R509 Fever, unspecified: Secondary | ICD-10-CM | POA: Insufficient documentation

## 2021-06-10 DIAGNOSIS — R000 Tachycardia, unspecified: Secondary | ICD-10-CM | POA: Diagnosis not present

## 2021-06-10 DIAGNOSIS — Z85828 Personal history of other malignant neoplasm of skin: Secondary | ICD-10-CM | POA: Insufficient documentation

## 2021-06-10 DIAGNOSIS — R110 Nausea: Secondary | ICD-10-CM | POA: Diagnosis not present

## 2021-06-10 DIAGNOSIS — R918 Other nonspecific abnormal finding of lung field: Secondary | ICD-10-CM | POA: Diagnosis not present

## 2021-06-10 DIAGNOSIS — Z87891 Personal history of nicotine dependence: Secondary | ICD-10-CM | POA: Insufficient documentation

## 2021-06-10 LAB — COMPREHENSIVE METABOLIC PANEL
ALT: 14 U/L (ref 0–44)
AST: 17 U/L (ref 15–41)
Albumin: 3.9 g/dL (ref 3.5–5.0)
Alkaline Phosphatase: 56 U/L (ref 38–126)
Anion gap: 9 (ref 5–15)
BUN: 14 mg/dL (ref 8–23)
CO2: 22 mmol/L (ref 22–32)
Calcium: 8.8 mg/dL — ABNORMAL LOW (ref 8.9–10.3)
Chloride: 107 mmol/L (ref 98–111)
Creatinine, Ser: 1.25 mg/dL — ABNORMAL HIGH (ref 0.61–1.24)
GFR, Estimated: >60 mL/min (ref >60)
Glucose, Bld: 133 mg/dL — ABNORMAL HIGH (ref 70–99)
Potassium: 4.2 mmol/L (ref 3.5–5.1)
Sodium: 138 mmol/L (ref 135–145)
Total Bilirubin: 1.2 mg/dL (ref 0.3–1.2)
Total Protein: 7.1 g/dL (ref 6.5–8.1)

## 2021-06-10 LAB — URINALYSIS, ROUTINE W REFLEX MICROSCOPIC
Bilirubin Urine: NEGATIVE
Glucose, UA: NEGATIVE mg/dL
Hgb urine dipstick: NEGATIVE
Ketones, ur: 80 mg/dL — AB
Leukocytes,Ua: NEGATIVE
Nitrite: NEGATIVE
Protein, ur: NEGATIVE mg/dL
Specific Gravity, Urine: 1.021 (ref 1.005–1.030)
pH: 6 (ref 5.0–8.0)

## 2021-06-10 LAB — CBC WITH DIFFERENTIAL/PLATELET
Abs Immature Granulocytes: 0.07 10*3/uL (ref 0.00–0.07)
Basophils Absolute: 0 10*3/uL (ref 0.0–0.1)
Basophils Relative: 0 %
Eosinophils Absolute: 0 10*3/uL (ref 0.0–0.5)
Eosinophils Relative: 0 %
HCT: 32.4 % — ABNORMAL LOW (ref 39.0–52.0)
Hemoglobin: 10.5 g/dL — ABNORMAL LOW (ref 13.0–17.0)
Immature Granulocytes: 1 %
Lymphocytes Relative: 3 %
Lymphs Abs: 0.5 10*3/uL — ABNORMAL LOW (ref 0.7–4.0)
MCH: 30.9 pg (ref 26.0–34.0)
MCHC: 32.4 g/dL (ref 30.0–36.0)
MCV: 95.3 fL (ref 80.0–100.0)
Monocytes Absolute: 0.8 10*3/uL (ref 0.1–1.0)
Monocytes Relative: 6 %
Neutro Abs: 12 10*3/uL — ABNORMAL HIGH (ref 1.7–7.7)
Neutrophils Relative %: 90 %
Platelets: 237 10*3/uL (ref 150–400)
RBC: 3.4 MIL/uL — ABNORMAL LOW (ref 4.22–5.81)
RDW: 15.2 % (ref 11.5–15.5)
WBC: 13.4 10*3/uL — ABNORMAL HIGH (ref 4.0–10.5)
nRBC: 0 % (ref 0.0–0.2)

## 2021-06-10 LAB — PROTIME-INR
INR: 1.1 (ref 0.8–1.2)
Prothrombin Time: 13.7 seconds (ref 11.4–15.2)

## 2021-06-10 LAB — LACTIC ACID, PLASMA
Lactic Acid, Venous: 1 mmol/L (ref 0.5–1.9)
Lactic Acid, Venous: 0.8 mmol/L (ref 0.5–1.9)

## 2021-06-10 LAB — RESP PANEL BY RT-PCR (FLU A&B, COVID) ARPGX2
Influenza A by PCR: NEGATIVE
Influenza B by PCR: NEGATIVE
SARS Coronavirus 2 by RT PCR: NEGATIVE

## 2021-06-10 MED ORDER — IBUPROFEN 800 MG PO TABS
800.0000 mg | ORAL_TABLET | Freq: Once | ORAL | Status: AC
  Administered 2021-06-10: 800 mg via ORAL
  Filled 2021-06-10: qty 1

## 2021-06-10 MED ORDER — IOHEXOL 300 MG/ML  SOLN
100.0000 mL | Freq: Once | INTRAMUSCULAR | Status: AC | PRN
  Administered 2021-06-10: 100 mL via INTRAVENOUS

## 2021-06-10 MED ORDER — DIPHENHYDRAMINE HCL 25 MG PO CAPS: 50.0000 mg | ORAL_CAPSULE | Freq: Once | ORAL | Status: AC

## 2021-06-10 MED ORDER — DIPHENHYDRAMINE HCL 50 MG/ML IJ SOLN
50.0000 mg | Freq: Once | INTRAMUSCULAR | Status: AC
  Administered 2021-06-10: 50 mg via INTRAVENOUS
  Filled 2021-06-10: qty 1

## 2021-06-10 MED ORDER — FENTANYL CITRATE PF 50 MCG/ML IJ SOSY
50.0000 ug | PREFILLED_SYRINGE | Freq: Once | INTRAMUSCULAR | Status: AC
  Administered 2021-06-10: 50 ug via INTRAVENOUS
  Filled 2021-06-10: qty 1

## 2021-06-10 MED ORDER — HYDROCORTISONE SOD SUC (PF) 250 MG IJ SOLR
200.0000 mg | Freq: Once | INTRAMUSCULAR | Status: AC
  Administered 2021-06-10: 200 mg via INTRAVENOUS
  Filled 2021-06-10: qty 200

## 2021-06-10 MED ORDER — SODIUM CHLORIDE 0.9 % IV BOLUS
1000.0000 mL | Freq: Once | INTRAVENOUS | Status: AC
  Administered 2021-06-10: 1000 mL via INTRAVENOUS

## 2021-06-10 MED ORDER — LACTATED RINGERS IV BOLUS
1000.0000 mL | Freq: Once | INTRAVENOUS | Status: AC
  Administered 2021-06-10: 1000 mL via INTRAVENOUS

## 2021-06-10 NOTE — ED Notes (Signed)
Went to put into cloths to discharge noticed pt cloths are wet. I put pt in a pair of blue paper scrubs and socks

## 2021-06-10 NOTE — ED Provider Notes (Signed)
[facility] EMERGENCY DEPARTMENT Provider Note   CSN: [id number] Arrival date & time: 06/10/21  0119     History Chief Complaint  Patient presents with   Fever    Travis Padilla is a 76 y.o. male.  75 year old male who presents emerged part today for fever.  Patient has a history of lung cancer status post chemo and radiation most recently in March, holding since at least May secondary to some type of intolerance. Patient felt unwell earlier today and had a fever. Called the clinic. Told to come to ED. Has had a productive cough. No other complaints at this time.    Fever     Past Medical History:  Diagnosis Date   Abnormal nuclear stress test    December, 2013   Anemia    Axillary adenopathy 04/24/2018   CAD (coronary artery disease)    Mild nonobstructive plaque in cath 2013   Chest pain    December, 2013   Cough with hemoptysis 04/22/2018   Dizziness    Dyslipidemia 07/14/2019   Encounter for antineoplastic immunotherapy 05/13/2018   Gout    Hemorrhoid    Hyperlipidemia    Hypertension    Hypothyroidism (acquired) 08/11/2018   Metastasis to lung ([place]) 05/19/2018   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) ([place]) dx'd 04/17/18   to LN, infrahilar mass, lung nodule and lt axilla   Obesity, unspecified 03/28/2009   Qualifier: Diagnosis of  By: [name], MD, [name]     Palpitations 03/28/2009   Qualifier: Diagnosis of  By: [name], MD, [name]     Right lower lobe lung mass 04/22/2018   Skin cancer    Sleep apnea    SNORING 03/28/2009   Qualifier: Diagnosis of  By: [name], MD, [name]     Spinal stenosis    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    Patient Active Problem List   Diagnosis Date Noted   Acute blood loss anemia    Gastrointestinal hemorrhage associated with acute gastritis    Unstable angina ([place]) 05/25/2021   Coronary artery disease involving native coronary artery of native heart  without angina pectoris 12/07/2020   SIRS (systemic inflammatory response syndrome) ([place]) 11/27/2020   Encephalitis    Thrombocytopenia (HCC)    Severe sepsis without septic shock ([place]) 11/26/2020   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   Itching 11/17/2019   Dyslipidemia 07/14/2019   Shoulder pain 03/31/2019   Port-A-Cath in place 08/11/2018   Hypothyroidism (acquired) 08/11/2018   Metastasis to lung ([place]) 05/19/2018   Encounter for antineoplastic chemotherapy 05/13/2018   Encounter for antineoplastic immunotherapy 05/13/2018   Goals of care, counseling/discussion 05/13/2018   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   Axillary adenopathy 04/24/2018   Right lower lobe lung mass 04/22/2018   Cough with hemoptysis 04/22/2018   Wheezing 04/02/2018   Long-term use of high-risk medication 04/02/2018   Dizziness 06/26/2017   Fatigue 04/30/2016   Insomnia 04/30/2016   Sinusitis 01/31/2016   Hyperlipidemia 01/31/2016   Ear pain 05/09/2015   Nephrolithiasis 04/18/2015   Anemia    Abnormal nuclear stress test    OBESITY, UNSPECIFIED 03/28/2009   Essential hypertension, benign 03/28/2009   PALPITATIONS 03/28/2009   SNORING 03/28/2009    Past Surgical History:  Procedure Laterality Date   basal skin cancer N/A 2019   Nose   BELPHAROPTOSIS REPAIR Bilateral  CARDIAC CATHETERIZATION     CATARACT EXTRACTION W/ INTRAOCULAR LENS  IMPLANT, BILATERAL     COLONOSCOPY N/A 11/03/2014   Procedure: COLONOSCOPY;  Surgeon: Rogene Houston, MD;  Location: [facility];  Service: Endoscopy;  Laterality: N/A;  1225   CORONARY ATHERECTOMY N/A 05/26/2021   Procedure: CORONARY ATHERECTOMY;  Surgeon: Early Osmond, MD;  Location: [place] CV LAB;  Service: Cardiovascular;  Laterality: N/A;   CORONARY STENT INTERVENTION N/A 05/25/2021   Procedure: CORONARY STENT INTERVENTION;  Surgeon: Nelva Bush, MD;  Location: [place] CV LAB;  Service: Cardiovascular;  Laterality: N/A;   CORONARY STENT INTERVENTION N/A 05/26/2021   Procedure: CORONARY STENT INTERVENTION;  Surgeon: Early Osmond, MD;  Location: [place] CV LAB;  Service: Cardiovascular;  Laterality: N/A;   ESOPHAGOGASTRODUODENOSCOPY (EGD) WITH PROPOFOL N/A 05/29/2021   Procedure: ESOPHAGOGASTRODUODENOSCOPY (EGD) WITH PROPOFOL;  Surgeon: Gatha Mayer, MD;  Location: [place];  Service: Endoscopy;  Laterality: N/A;   INTRAVASCULAR ULTRASOUND/IVUS N/A 05/26/2021   Procedure: Intravascular Ultrasound/IVUS;  Surgeon: Early Osmond, MD;  Location: [place] CV LAB;  Service: Cardiovascular;  Laterality: N/A;   IR CV LINE INJECTION  08/24/2020   IR FLUORO GUIDED NEEDLE PLC ASPIRATION/INJECTION LOC  11/28/2020   IR IMAGING GUIDED PORT INSERTION  07/11/2018   KNEE CARTILAGE SURGERY Left    Left knee   LEFT HEART CATH AND CORONARY ANGIOGRAPHY N/A 05/25/2021   Procedure: LEFT HEART CATH AND CORONARY ANGIOGRAPHY;  Surgeon: Nelva Bush, MD;  Location: [place] CV LAB;  Service: Cardiovascular;  Laterality: N/A;   SKIN CANCER EXCISION  12/2014, 04/25/15   TEMPORARY PACEMAKER N/A 05/26/2021   Procedure: TEMPORARY PACEMAKER;  Surgeon: Early Osmond, MD;  Location: [place] CV LAB;  Service: Cardiovascular;  Laterality: N/A;   VIDEO BRONCHOSCOPY Bilateral 05/09/2018   Procedure: VIDEO BRONCHOSCOPY WITH FLUORO;  Surgeon: Juanito Doom, MD;  Location: [facility] ENDOSCOPY;  Service: Cardiopulmonary;  Laterality: Bilateral;       Family History  Problem Relation Age of Onset   Heart attack Father    Heart failure Mother    Parkinson's disease Mother    Healthy Sister    Skin cancer Sister    Neuropathy Brother    COPD Brother    Epilepsy Brother    Healthy Sister    Healthy Sister    Colon cancer Neg Hx     Social History   Tobacco Use   Smoking status: Former    Packs/day: 0.20    Years: 2.00    Pack years: 0.40    Types:  Cigarettes    Start date: 10/01/1968    Quit date: 1972    Years since quitting: 50.7   Smokeless tobacco: Never  Vaping Use   Vaping Use: Never used  Substance Use Topics   Alcohol use: No    Comment: hx of    Drug use: No    Home Medications Prior to Admission medications   Medication Sig Start Date End Date Taking? Authorizing Provider  acetaminophen (TYLENOL) 500 MG tablet Take 1,000 mg by mouth every 6 (six) hours as needed.    [provider]  albuterol (VENTOLIN HFA) 108 (90 Base) MCG/ACT inhaler Inhale 2 puffs into the lungs every 6 (six) hours as needed for wheezing or shortness of breath. 04/04/21   Icard, Octavio Graves, DO  ALPRAZolam (XANAX) 0.25 MG tablet Take 1 tablet (0.25 mg total) by mouth at bedtime as needed for anxiety. 06/05/21  Ronnie Doss M, DO  Artificial Tear Solution (SOOTHE XP OP) Place 1 drop into both eyes 2 (two) times daily.    [provider]  carvedilol (COREG) 3.125 MG tablet Take 1 tablet (3.125 mg total) by mouth 2 (two) times daily. 06/06/21 09/04/21  Deberah Pelton, NP  clopidogrel (PLAVIX) 75 MG tablet Take 1 tablet (75 mg total) by mouth daily. 06/01/21   Cheryln Manly, NP  fluticasone (FLONASE) 50 MCG/ACT nasal spray Place 1 spray into both nostrils daily. 07/01/20   Lauraine Rinne, NP  Fluticasone-Umeclidin-Vilant (TRELEGY ELLIPTA) 100-62.5-25 MCG/INH AEPB Inhale 1 puff into the lungs daily. 03/20/21   Icard, Octavio Graves, DO  Iron, Ferrous Sulfate, 325 (65 Fe) MG TABS Take 325 mg by mouth daily. 05/31/21   Cheryln Manly, NP  levothyroxine (SYNTHROID) 150 MCG tablet Take 1 tablet (150 mcg total) by mouth daily before breakfast. 05/01/21   Heilingoetter, Cassandra L, PA-C  methocarbamol (ROBAXIN) 500 MG tablet Take 500 mg by mouth daily. 08/11/19   [provider]  nitroGLYCERIN (NITROSTAT) 0.4 MG SL tablet Place 1 tablet (0.4 mg total) under the tongue every 5 (five) minutes as needed for chest pain. 05/31/21   Cheryln Manly, NP  pantoprazole (PROTONIX) 40 MG tablet Take 1 tablet (40 mg total) by mouth daily before breakfast. 06/01/21   Reino Bellis B, NP  polyethylene glycol powder (GLYCOLAX/MIRALAX) 17 GM/SCOOP powder Take 1 Container by mouth once.    [provider]  PRALUENT 150 MG/ML SOAJ INJECT 150 MG INTO THE SKIN EVERY 14 (FOURTEEN) DAYS. 04/24/21   [name], MD  senna-docusate (SENNA S) 8.6-50 MG tablet 1 to 2 twice daily for constipation 07/07/18   Harle Stanford., PA-C  tamsulosin (FLOMAX) 0.4 MG CAPS capsule Take 0.4 mg by mouth at bedtime. 09/29/20   [provider]    Allergies    Iohexol, Pravastatin, and Iodine  Review of Systems   Review of Systems  Constitutional:  Positive for fever.  All other systems reviewed and are negative.  Physical Exam Updated Vital Signs BP 130/80   Pulse (!) 101   Temp 98.4 F (36.9 C)   Resp (!) 29   Ht 6\' 2"  (1.88 m)   Wt 104 kg   SpO2 92%   BMI 29.44 kg/m   Physical Exam Vitals and nursing note reviewed.  HENT:     Mouth/Throat:     Mouth: Mucous membranes are moist.     Pharynx: Oropharynx is clear.  Eyes:     Pupils: Pupils are equal, round, and reactive to light.  Cardiovascular:     Rate and Rhythm: Tachycardia present.  Pulmonary:     Effort: No respiratory distress.     Breath sounds: No wheezing or rales.  Abdominal:     General: Abdomen is flat.  Musculoskeletal:        General: No swelling or tenderness. Normal range of motion.  Skin:    General: Skin is warm and dry.  Neurological:     General: No focal deficit present.    ED Results / Procedures / Treatments   Labs (all labs ordered are listed, but only abnormal results are displayed) Labs Reviewed  COMPREHENSIVE METABOLIC PANEL - Abnormal; Notable for the following components:      Result Value   Glucose, Bld 133 (*)    Creatinine, Ser 1.25 (*)    Calcium 8.8 (*)    All other components within normal limits  CBC WITH  DIFFERENTIAL/PLATELET - Abnormal; Notable for the following components:   WBC 13.4 (*)    RBC 3.40 (*)    Hemoglobin 10.5 (*)    HCT 32.4 (*)    Neutro Abs 12.0 (*)    Lymphs Abs 0.5 (*)    All other components within normal limits  URINALYSIS, ROUTINE W REFLEX MICROSCOPIC - Abnormal; Notable for the following components:   Ketones, ur 80 (*)    All other components within normal limits  CULTURE, BLOOD (ROUTINE X 2)  CULTURE, BLOOD (ROUTINE X 2)  RESP PANEL BY RT-PCR (FLU A&B, COVID) ARPGX2  LACTIC ACID, PLASMA  LACTIC ACID, PLASMA  PROTIME-INR    EKG None  Radiology DG Chest Port 1 View  Result Date: 06/10/2021 CLINICAL DATA:  Fevers and cough for 2 days EXAM: PORTABLE CHEST 1 VIEW COMPARISON:  05/22/2021 FINDINGS: Right chest wall port is again identified. Cardiac shadow is stable. Persistent architectural distortion in the right hilar region is noted. No new focal infiltrate or effusion is seen. No bony abnormality is noted. Stable left upper lobe mass lesion is noted in the perihilar region. IMPRESSION: No acute abnormality noted. Electronically Signed   By: Inez Catalina M.D.   On: 06/10/2021 01:59    Procedures Procedures   Medications Ordered in ED Medications  iohexol (OMNIPAQUE) 300 MG/ML solution 100 mL (has no administration in time range)  diphenhydrAMINE (BENADRYL) capsule 50 mg (has no administration in time range)    Or  diphenhydrAMINE (BENADRYL) injection 50 mg (has no administration in time range)  sodium chloride 0.9 % bolus 1,000 mL (0 mLs Intravenous Stopped 06/10/21 0420)  ibuprofen (ADVIL) tablet 800 mg (800 mg Oral Given 06/10/21 0416)  fentaNYL (SUBLIMAZE) injection 50 mcg (50 mcg Intravenous Given 06/10/21 0414)  lactated ringers bolus 1,000 mL (0 mLs Intravenous Stopped 06/10/21 0627)  hydrocortisone sodium succinate (SOLU-CORTEF) injection 200 mg (200 mg Intravenous Given 06/10/21 0535)    ED Course  I have reviewed the triage vital signs and the  nursing notes.  Pertinent labs & imaging results that were available during my care of the patient were reviewed by me and considered in my medical decision making (see chart for details).    MDM Rules/Calculators/A&P                         Possibly pneumonia? Covid? Will check labs/cultures for futrher treatmetn.   Patient now developing headache. H/o same. Will treat with meds.   Care transferred pending ct scans to evaluate for occult infection.   Final Clinical Impression(s) / ED Diagnoses Final diagnoses:  Fever    Rx / DC Orders ED Discharge Orders     None        Obaloluwa Delatte, Corene Cornea, MD 06/10/21 [phone]

## 2021-06-10 NOTE — ED Provider Notes (Addendum)
Care of the patient assumed at the change of shift. Patient with history of lung cancer here for fever at home. Not neutropenic. Pending CT to eval source. Getting pretreatment for contrast allergy.  Physical Exam  BP 130/80   Pulse (!) 101   Temp 98.4 F (36.9 C)   Resp (!) 29   Ht 6\' 2"  (1.88 m)   Wt 104 kg   SpO2 92%   BMI 29.44 kg/m   Physical Exam  ED Course/Procedures     Procedures  MDM  11:17 AM CT neg for a source of his fever. Patient's family at bedside now is concerned about patient's headache. He is complaining of frontal headache, ongoing for several days. They were told to be evaluated for headache after being started on blood thinners post PCI a few weeks ago. Patient does not have any focal deficits, no neck pain or stiffness. He has not had a fever since arrival here, so doubt this is meningitis, however given his blood thinner will send for CT to rule ICH.   1:28 PM CT head neg. Patient awake and alert. Able to ambulate with minimal assistance and wants to go home. Son aware of the plan as well. Recommend close PCP follow up, RTED for any other concerns.      Truddie Hidden, MD 06/10/21 1328

## 2021-06-10 NOTE — ED Triage Notes (Addendum)
Pt with c/o Fevers since 2pm yesterday. PT states last temp was 104 at 2300 and that he took Tylenol for this. Pt with Lung cancer. Pt also with productive cough (green sputum). Pt recently on ABX therapy for sinus "issues".

## 2021-06-11 ENCOUNTER — Emergency Department (HOSPITAL_COMMUNITY): Payer: Medicare Other

## 2021-06-11 ENCOUNTER — Encounter (HOSPITAL_COMMUNITY): Payer: Self-pay

## 2021-06-11 ENCOUNTER — Other Ambulatory Visit: Payer: Self-pay

## 2021-06-11 ENCOUNTER — Emergency Department (HOSPITAL_COMMUNITY)
Admission: EM | Admit: 2021-06-11 | Discharge: 2021-06-12 | Disposition: A | Discharge status: Home / Self Care | Payer: Medicare Other | Attending: Emergency Medicine | Admitting: Emergency Medicine

## 2021-06-11 DIAGNOSIS — Z950 Presence of cardiac pacemaker: Secondary | ICD-10-CM | POA: Insufficient documentation

## 2021-06-11 DIAGNOSIS — R509 Fever, unspecified: Secondary | ICD-10-CM | POA: Diagnosis not present

## 2021-06-11 DIAGNOSIS — Z79899 Other long term (current) drug therapy: Secondary | ICD-10-CM | POA: Diagnosis not present

## 2021-06-11 DIAGNOSIS — R000 Tachycardia, unspecified: Secondary | ICD-10-CM | POA: Diagnosis not present

## 2021-06-11 DIAGNOSIS — R531 Weakness: Secondary | ICD-10-CM | POA: Diagnosis not present

## 2021-06-11 DIAGNOSIS — E039 Hypothyroidism, unspecified: Secondary | ICD-10-CM | POA: Insufficient documentation

## 2021-06-11 DIAGNOSIS — Z85118 Personal history of other malignant neoplasm of bronchus and lung: Secondary | ICD-10-CM | POA: Diagnosis not present

## 2021-06-11 DIAGNOSIS — Z85828 Personal history of other malignant neoplasm of skin: Secondary | ICD-10-CM | POA: Insufficient documentation

## 2021-06-11 DIAGNOSIS — I10 Essential (primary) hypertension: Secondary | ICD-10-CM | POA: Diagnosis not present

## 2021-06-11 DIAGNOSIS — Z7902 Long term (current) use of antithrombotics/antiplatelets: Secondary | ICD-10-CM | POA: Insufficient documentation

## 2021-06-11 DIAGNOSIS — R791 Abnormal coagulation profile: Secondary | ICD-10-CM | POA: Insufficient documentation

## 2021-06-11 DIAGNOSIS — Z20822 Contact with and (suspected) exposure to covid-19: Secondary | ICD-10-CM | POA: Insufficient documentation

## 2021-06-11 DIAGNOSIS — R519 Headache, unspecified: Secondary | ICD-10-CM | POA: Insufficient documentation

## 2021-06-11 DIAGNOSIS — I2510 Atherosclerotic heart disease of native coronary artery without angina pectoris: Secondary | ICD-10-CM | POA: Insufficient documentation

## 2021-06-11 DIAGNOSIS — Z87891 Personal history of nicotine dependence: Secondary | ICD-10-CM | POA: Insufficient documentation

## 2021-06-11 LAB — CBC WITH DIFFERENTIAL/PLATELET
Abs Immature Granulocytes: 0.04 10*3/uL (ref 0.00–0.07)
Basophils Absolute: 0 10*3/uL (ref 0.0–0.1)
Basophils Relative: 0 %
Eosinophils Absolute: 0 10*3/uL (ref 0.0–0.5)
Eosinophils Relative: 0 %
HCT: 31.9 % — ABNORMAL LOW (ref 39.0–52.0)
Hemoglobin: 10.5 g/dL — ABNORMAL LOW (ref 13.0–17.0)
Immature Granulocytes: 1 %
Lymphocytes Relative: 15 %
Lymphs Abs: 1.1 10*3/uL (ref 0.7–4.0)
MCH: 31.2 pg (ref 26.0–34.0)
MCHC: 32.9 g/dL (ref 30.0–36.0)
MCV: 94.7 fL (ref 80.0–100.0)
Monocytes Absolute: 0.7 10*3/uL (ref 0.1–1.0)
Monocytes Relative: 9 %
Neutro Abs: 5.4 10*3/uL (ref 1.7–7.7)
Neutrophils Relative %: 75 %
Platelets: 202 10*3/uL (ref 150–400)
RBC: 3.37 MIL/uL — ABNORMAL LOW (ref 4.22–5.81)
RDW: 15.2 % (ref 11.5–15.5)
WBC: 7.2 10*3/uL (ref 4.0–10.5)
nRBC: 0 % (ref 0.0–0.2)

## 2021-06-11 LAB — RESP PANEL BY RT-PCR (FLU A&B, COVID) ARPGX2
Influenza A by PCR: NEGATIVE
Influenza B by PCR: NEGATIVE
SARS Coronavirus 2 by RT PCR: NEGATIVE

## 2021-06-11 LAB — COMPREHENSIVE METABOLIC PANEL
ALT: 12 U/L (ref 0–44)
AST: 14 U/L — ABNORMAL LOW (ref 15–41)
Albumin: 4.1 g/dL (ref 3.5–5.0)
Alkaline Phosphatase: 46 U/L (ref 38–126)
Anion gap: 7 (ref 5–15)
BUN: 15 mg/dL (ref 8–23)
CO2: 25 mmol/L (ref 22–32)
Calcium: 9.2 mg/dL (ref 8.9–10.3)
Chloride: 109 mmol/L (ref 98–111)
Creatinine, Ser: 1.42 mg/dL — ABNORMAL HIGH (ref 0.61–1.24)
GFR, Estimated: 52 mL/min — ABNORMAL LOW (ref >60)
Glucose, Bld: 109 mg/dL — ABNORMAL HIGH (ref 70–99)
Potassium: 3.8 mmol/L (ref 3.5–5.1)
Sodium: 141 mmol/L (ref 135–145)
Total Bilirubin: 0.9 mg/dL (ref 0.3–1.2)
Total Protein: 7.4 g/dL (ref 6.5–8.1)

## 2021-06-11 LAB — TROPONIN I (HIGH SENSITIVITY)
Troponin I (High Sensitivity): 25 ng/L — ABNORMAL HIGH (ref <18)
Troponin I (High Sensitivity): 26 ng/L — ABNORMAL HIGH (ref <18)

## 2021-06-11 LAB — URINALYSIS, ROUTINE W REFLEX MICROSCOPIC
Bilirubin Urine: NEGATIVE
Glucose, UA: NEGATIVE mg/dL
Hgb urine dipstick: NEGATIVE
Ketones, ur: 20 mg/dL — AB
Leukocytes,Ua: NEGATIVE
Nitrite: NEGATIVE
Protein, ur: NEGATIVE mg/dL
Specific Gravity, Urine: 1.02 (ref 1.005–1.030)
pH: 5 (ref 5.0–8.0)

## 2021-06-11 LAB — LACTIC ACID, PLASMA
Lactic Acid, Venous: 1.2 mmol/L (ref 0.5–1.9)
Lactic Acid, Venous: 1 mmol/L (ref 0.5–1.9)

## 2021-06-11 LAB — APTT: aPTT: 29 seconds (ref 24–36)

## 2021-06-11 LAB — PROTIME-INR
INR: 1.1 (ref 0.8–1.2)
Prothrombin Time: 14 seconds (ref 11.4–15.2)

## 2021-06-11 MED ORDER — ACETAMINOPHEN 325 MG PO TABS
650.0000 mg | ORAL_TABLET | Freq: Once | ORAL | Status: AC | PRN
  Administered 2021-06-11: 650 mg via ORAL
  Filled 2021-06-11: qty 2

## 2021-06-11 MED ORDER — LACTATED RINGERS IV SOLN: INTRAVENOUS | Status: DC

## 2021-06-11 MED ORDER — SODIUM CHLORIDE 0.9 % IV SOLN
1.0000 g | Freq: Once | INTRAVENOUS | Status: AC
  Administered 2021-06-11: 1 g via INTRAVENOUS
  Filled 2021-06-11: qty 10

## 2021-06-11 MED ORDER — LACTATED RINGERS IV BOLUS (SEPSIS)
1000.0000 mL | Freq: Once | INTRAVENOUS | Status: AC
  Administered 2021-06-11: 1000 mL via INTRAVENOUS

## 2021-06-11 MED ORDER — SODIUM CHLORIDE 0.9 % IV SOLN
500.0000 mg | Freq: Once | INTRAVENOUS | Status: AC
  Administered 2021-06-11: 500 mg via INTRAVENOUS
  Filled 2021-06-11: qty 500

## 2021-06-11 NOTE — ED Provider Notes (Signed)
[facility] DEPT Provider Note   CSN: [id number] Arrival date & time: 06/11/21  1728     History Chief Complaint  Patient presents with   Hypertension   Weakness   Fever   Headache    Travis Padilla is a 75 y.o. male.   Hypertension Associated symptoms include headaches.  Weakness Associated symptoms: fever and headaches   Fever Associated symptoms: headaches   Headache Associated symptoms: fever and weakness    Patient is a 75 year old male with a history of CAD status post left heart catheterization on 05/25/2021 which showed 80% mid LAD occlusion, 70 to 90% proximal mid RCA lesion.  He received DES to his LAD and staged PCI on 9/16 with successful coronary arthrectomy and stenting of his high-grade calcified lesions of the proximal mid RCA, presenting to the emergency department with fever And generalized weakness with associated frontal headache for the last 2 days. He denies any shortness of breath, chest pain, neck pain or stiffness.  He denies any abdominal pain, diarrhea or dysuria.  He has had a chronic cough for the past several weeks but reports no productive sputum.   Past Medical History:  Diagnosis Date   Abnormal nuclear stress test    December, 2013   Anemia    Axillary adenopathy 04/24/2018   CAD (coronary artery disease)    Mild nonobstructive plaque in cath 2013   Chest pain    December, 2013   Cough with hemoptysis 04/22/2018   Dizziness    Dyslipidemia 07/14/2019   Encounter for antineoplastic immunotherapy 05/13/2018   Gout    Hemorrhoid    Hyperlipidemia    Hypertension    Hypothyroidism (acquired) 08/11/2018   Metastasis to lung ([place]) 05/19/2018   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) ([place]) dx'd 04/17/18   to LN, infrahilar mass, lung nodule and lt axilla   Obesity, unspecified 03/28/2009   Qualifier: Diagnosis of  By: [name], MD, [name]     Palpitations 03/28/2009   Qualifier:  Diagnosis of  By: [name], MD, [name]     Right lower lobe lung mass 04/22/2018   Skin cancer    Sleep apnea    SNORING 03/28/2009   Qualifier: Diagnosis of  By: [name], MD, [name]     Spinal stenosis    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    Patient Active Problem List   Diagnosis Date Noted   Acute blood loss anemia    Gastrointestinal hemorrhage associated with acute gastritis    Unstable angina ([place]) 05/25/2021   Coronary artery disease involving native coronary artery of native heart without angina pectoris 12/07/2020   SIRS (systemic inflammatory response syndrome) ([place]) 11/27/2020   Encephalitis    Thrombocytopenia (H[facility])    Severe sepsis without septic shock ([place]) 11/26/2020   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   Itching 11/17/2019   Dyslipidemia 07/14/2019   Shoulder pain 03/31/2019   Port-A-Cath in place 08/11/2018   Hypothyroidism (acquired) 08/11/2018   Metastasis to lung ([name]) 05/19/2018   Encounter for antineoplastic chemotherapy 05/13/2018   Encounter for antineoplastic immunotherapy 05/13/2018   Goals of care, counseling/discussion 05/13/2018   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   Axillary adenopathy 04/24/2018   Right lower lobe lung mass 04/22/2018   Cough with hemoptysis 04/22/2018   Wheezing 04/02/2018   Long-term use of high-risk medication 04/02/2018   Dizziness  06/26/2017   Fatigue 04/30/2016   Insomnia 04/30/2016   Sinusitis 01/31/2016   Hyperlipidemia 01/31/2016   Ear pain 05/09/2015   Nephrolithiasis 04/18/2015   Anemia    Abnormal nuclear stress test    OBESITY, UNSPECIFIED 03/28/2009   Essential hypertension, benign 03/28/2009   PALPITATIONS 03/28/2009   SNORING 03/28/2009    Past Surgical History:  Procedure Laterality Date   basal skin cancer N/A 2019   Nose   BELPHAROPTOSIS REPAIR Bilateral    CARDIAC  CATHETERIZATION     CATARACT EXTRACTION W/ INTRAOCULAR LENS  IMPLANT, BILATERAL     COLONOSCOPY N/A 11/03/2014   Procedure: COLONOSCOPY;  Surgeon: Rogene Houston, MD;  [facility];  Service: Endoscopy;  Laterality: N/A;  1225   CORONARY ATHERECTOMY N/A 05/26/2021   Procedure: CORONARY ATHERECTOMY;  Surgeon: Early Osmond, MD;  Location: [facility];  Service: Cardiovascular;  Laterality: N/A;   CORONARY STENT INTERVENTION N/A 05/25/2021   Procedure: CORONARY STENT INTERVENTION;  Surgeon: Nelva Bush, MD;  Location: [facility];  Service: Cardiovascular;  Laterality: N/A;   CORONARY STENT INTERVENTION N/A 05/26/2021   Procedure: CORONARY STENT INTERVENTION;  Surgeon: Early Osmond, MD;  Location: [facility];  Service: Cardiovascular;  Laterality: N/A;   ESOPHAGOGASTRODUODENOSCOPY (EGD) WITH PROPOFOL N/A 05/29/2021   Procedure: ESOPHAGOGASTRODUODENOSCOPY (EGD) WITH PROPOFOL;  Surgeon: Gatha Mayer, MD;  Location: [facility];  Service: Endoscopy;  Laterality: N/A;   INTRAVASCULAR ULTRASOUND/IVUS N/A 05/26/2021   Procedure: Intravascular Ultrasound/IVUS;  Surgeon: Early Osmond, MD;  Location: [facility];  Service: Cardiovascular;  Laterality: N/A;   IR CV LINE INJECTION  08/24/2020   IR FLUORO GUIDED NEEDLE PLC ASPIRATION/INJECTION LOC  11/28/2020   IR IMAGING GUIDED PORT INSERTION  07/11/2018   KNEE CARTILAGE SURGERY Left    Left knee   LEFT HEART CATH AND CORONARY ANGIOGRAPHY N/A 05/25/2021   Procedure: LEFT HEART CATH AND CORONARY ANGIOGRAPHY;  Surgeon: Nelva Bush, MD;  Location: [facility];  Service: Cardiovascular;  Laterality: N/A;   SKIN CANCER EXCISION  12/2014, 04/25/15   TEMPORARY PACEMAKER N/A 05/26/2021   Procedure: TEMPORARY PACEMAKER;  Surgeon: Early Osmond, MD;  Location: [facility];  Service: Cardiovascular;  Laterality: N/A;   VIDEO BRONCHOSCOPY Bilateral 05/09/2018   Procedure: VIDEO BRONCHOSCOPY  WITH FLUORO;  Surgeon: Juanito Doom, MD;  Location: [facility];  Service: Cardiopulmonary;  Laterality: Bilateral;       Family History  Problem Relation Age of Onset   Heart attack Father    Heart failure Mother    Parkinson's disease Mother    Healthy Sister    Skin cancer Sister    Neuropathy Brother    COPD Brother    Epilepsy Brother    Healthy Sister    Healthy Sister    Colon cancer Neg Hx     Social History   Tobacco Use   Smoking status: Former    Packs/day: 0.20    Years: 2.00    Pack years: 0.40    Types: Cigarettes    Start date: 10/01/1968    Quit date: 1972    Years since quitting: 50.7   Smokeless tobacco: Never  Vaping Use   Vaping Use: Never used  Substance Use Topics   Alcohol use: No    Comment: hx of    Drug use: No    Home Medications Prior to Admission medications   Medication Sig Start Date  End Date Taking? Authorizing Provider  acetaminophen (TYLENOL) 500 MG tablet Take 1,000 mg by mouth every 6 (six) hours as needed.   Yes [provider]  albuterol (VENTOLIN HFA) 108 (90 Base) MCG/ACT inhaler Inhale 2 puffs into the lungs every 6 (six) hours as needed for wheezing or shortness of breath. 04/04/21  Yes Icard, Octavio Graves, DO  ALPRAZolam (XANAX) 0.25 MG tablet Take 1 tablet (0.25 mg total) by mouth at bedtime as needed for anxiety. 06/05/21  Yes Ronnie Doss M, DO  Artificial Tear Solution (SOOTHE XP OP) Place 1 drop into both eyes 2 (two) times daily.   Yes [provider]  carvedilol (COREG) 3.125 MG tablet Take 1 tablet (3.125 mg total) by mouth 2 (two) times daily. 06/06/21 09/04/21 Yes Cleaver, Jossie Ng, NP  clopidogrel (PLAVIX) 75 MG tablet Take 1 tablet (75 mg total) by mouth daily. 06/01/21  Yes Cheryln Manly, NP  fluticasone (FLONASE) 50 MCG/ACT nasal spray Place 1 spray into both nostrils daily. Patient taking differently: Place 1 spray into both nostrils daily as needed for allergies. 07/01/20  Yes Lauraine Rinne, NP  Fluticasone-Umeclidin-Vilant (TRELEGY ELLIPTA) 100-62.5-25 MCG/INH AEPB Inhale 1 puff into the lungs daily. 03/20/21  Yes Icard, Octavio Graves, DO  Iron, Ferrous Sulfate, 325 (65 Fe) MG TABS Take 325 mg by mouth daily. 05/31/21  Yes Cheryln Manly, NP  levothyroxine (SYNTHROID) 150 MCG tablet Take 1 tablet (150 mcg total) by mouth daily before breakfast. 05/01/21  Yes Heilingoetter, Cassandra L, PA-C  methocarbamol (ROBAXIN) 500 MG tablet Take 500 mg by mouth every 6 (six) hours as needed for muscle spasms. 08/11/19  Yes [provider]  nitroGLYCERIN (NITROSTAT) 0.4 MG SL tablet Place 1 tablet (0.4 mg total) under the tongue every 5 (five) minutes as needed for chest pain. 05/31/21  Yes Cheryln Manly, NP  pantoprazole (PROTONIX) 40 MG tablet Take 1 tablet (40 mg total) by mouth daily before breakfast. 06/01/21  Yes Reino Bellis B, NP  polyethylene glycol powder (GLYCOLAX/MIRALAX) 17 GM/SCOOP powder Take 1 Container by mouth daily.   Yes [provider]  PRALUENT 150 MG/ML SOAJ INJECT 150 MG INTO THE SKIN EVERY 14 (FOURTEEN) DAYS. 04/24/21  Yes [name], MD  senna-docusate (SENNA S) 8.6-50 MG tablet 1 to 2 twice daily for constipation Patient taking differently: Take 1-2 tablets by mouth 2 (two) times daily as needed for mild constipation or moderate constipation. 07/07/18  Yes Tanner, Lyndon Code., PA-C  sulfamethoxazole-trimethoprim (BACTRIM DS) 800-160 MG tablet Take 1 tablet by mouth 2 (two) times daily. 06/07/21  Yes [provider]  tamsulosin (FLOMAX) 0.4 MG CAPS capsule Take 0.4 mg by mouth at bedtime. 09/29/20  Yes [provider]    Allergies    Iohexol, Pravastatin, and Iodine  Review of Systems   Review of Systems  Constitutional:  Positive for fever.  Neurological:  Positive for weakness and headaches.  All other systems reviewed and are negative.  Physical Exam Updated Vital Signs BP 140/87   Pulse 92   Temp 99.5 F (37.5 C)  (Oral)   Resp (!) 25   Ht 6\' 2"  (1.88 m)   Wt 104 kg   SpO2 97%   BMI 29.44 kg/m   Physical Exam Vitals and nursing note reviewed.  Constitutional:      Appearance: He is well-developed.  HENT:     Head: Normocephalic and atraumatic.  Eyes:     Conjunctiva/sclera: Conjunctivae normal.  Cardiovascular:  Rate and Rhythm: Normal rate and regular rhythm.     Heart sounds: No murmur heard.    Comments: No nuchal rigidity, negative meningeal signs, good range of motion of the neck Pulmonary:     Effort: Pulmonary effort is normal. No respiratory distress.     Breath sounds: Normal breath sounds.  Abdominal:     Palpations: Abdomen is soft.     Tenderness: There is no abdominal tenderness.  Musculoskeletal:     Cervical back: Neck supple.  Skin:    General: Skin is warm and dry.  Neurological:     Mental Status: He is alert and oriented to person, place, and time. Mental status is at baseline.     GCS: GCS eye subscore is 4. GCS verbal subscore is 5. GCS motor subscore is 6.     Cranial Nerves: No cranial nerve deficit, dysarthria or facial asymmetry.     Sensory: No sensory deficit.     Motor: No weakness.    ED Results / Procedures / Treatments   Labs (all labs ordered are listed, but only abnormal results are displayed) Labs Reviewed  CBC WITH DIFFERENTIAL/PLATELET - Abnormal; Notable for the following components:      Result Value   RBC 3.37 (*)    Hemoglobin 10.5 (*)    HCT 31.9 (*)    All other components within normal limits  COMPREHENSIVE METABOLIC PANEL - Abnormal; Notable for the following components:   Glucose, Bld 109 (*)    Creatinine, Ser 1.42 (*)    AST 14 (*)    GFR, Estimated 52 (*)    All other components within normal limits  URINALYSIS, ROUTINE W REFLEX MICROSCOPIC - Abnormal; Notable for the following components:   APPearance HAZY (*)    Ketones, ur 20 (*)    All other components within normal limits  TROPONIN I (HIGH SENSITIVITY) - Abnormal;  Notable for the following components:   Troponin I (High Sensitivity) 25 (*)    All other components within normal limits  TROPONIN I (HIGH SENSITIVITY) - Abnormal; Notable for the following components:   Troponin I (High Sensitivity) 26 (*)    All other components within normal limits  RESP PANEL BY RT-PCR (FLU A&B, COVID) ARPGX2  CULTURE, BLOOD (ROUTINE X 2)  CULTURE, BLOOD (ROUTINE X 2)  URINE CULTURE  LACTIC ACID, PLASMA  LACTIC ACID, PLASMA  PROTIME-INR  APTT    EKG EKG Interpretation  Date/Time:  Sunday June 11 2021 19:28:54 EDT Ventricular Rate:  127 PR Interval:  124 QRS Duration: 137 QT Interval:  322 QTC Calculation: 468 R Axis:   -66 Text Interpretation: Sinus tachycardia RBBB and LAFB Borderline ST elevation, lateral leads Confirmed by Regan Lemming ([id number]) on 06/11/2021 7:41:15 PM  Radiology DG Chest 2 View  Result Date: 06/11/2021 CLINICAL DATA:  Fever. EXAM: CHEST - 2 VIEW COMPARISON:  Chest x-ray 06/10/2021. FINDINGS: Right chest port catheter tip projects over the distal SVC, unchanged. There is stable mediastinal widening in linear scarring in the right mid lung. There is no new focal lung infiltrate, pleural effusion or pneumothorax. Heart size is within normal limits. No acute fractures are seen. IMPRESSION: No active cardiopulmonary disease. Electronically Signed   By: Ronney Asters M.D.   On: 06/11/2021 19:59    Procedures Procedures   Medications Ordered in ED Medications  acetaminophen (TYLENOL) tablet 650 mg (650 mg Oral Given 06/11/21 1901)  lactated ringers bolus 1,000 mL (0 mLs Intravenous Stopped 06/11/21 2251)  cefTRIAXone (  ROCEPHIN) 1 g in sodium chloride 0.9 % 100 mL IVPB (0 g Intravenous Stopped 06/11/21 2251)  azithromycin (ZITHROMAX) 500 mg in sodium chloride 0.9 % 250 mL IVPB (0 mg Intravenous Stopped 06/11/21 2251)    ED Course  I have reviewed the triage vital signs and the nursing notes.  Pertinent labs & imaging results that were  available during my care of the patient were reviewed by me and considered in my medical decision making (see chart for details).    MDM Rules/Calculators/A&P                           75 year old male with a history of CAD status post left heart catheterization on 05/25/2021 which showed 80% mid LAD occlusion, 70 to 90% proximal mid RCA lesion.  He received DES to his LAD and staged PCI on 9/16 with successful coronary arthrectomy and stenting of his high-grade calcified lesions of the proximal mid RCA, presenting to the emergency department with fever And generalized weakness with associated frontal headache for the last 2 days. He denies any shortness of breath, chest pain, neck pain or stiffness.  He denies any abdominal pain, diarrhea or dysuria.  He has had a chronic cough for the past several weeks but reports no productive sputum.   On arrival, the patient was febrile to 103.2, tachycardic to 134, otherwise hemodynamically stable, saturating well on room air.  Beyond tension type headache described as a bandlike sensation across his forehead, fever, no other acute complaints.  The patient has no nuchal rigidity and no meningeal findings.  Low concern for meningitis or encephalitis.  He is at his baseline mental status with a reassuring neurologic exam.  His lungs were clear to auscultation bilaterally.  He denies any chest pain or shortness of breath.  He denies any infectious symptoms beyond fever at this time.  He was seen in the emergency department yesterday during which time he had an extensive infectious work-up.  This work-up resulted negative for an etiology of his fever to include CT head, CT chest abdomen pelvis with contrast which revealed no acute abnormalities.  Infectious work-up reinitiated today to include urinalysis without evidence of urinary tract infection, lactic acid normal, blood cultures x2 collected and pending, COVID-19 and influenza PCR resulted negative, CMP with  mildly elevated serum creatinine to 1.42, no other significant abnormalities, CBC without a leukocytosis.  A chest x-ray was performed which revealed no acute cardiac or pulmonary abnormality.  Troponins were collected and were nonspecifically elevated to 25 and 26.  The patient currently denies any chest pain, shortness of breath or diaphoresis.  Extremely low concern for ACS at this time.  The patient has a known history of CAD and is status post cardiac catheterization and stenting.  Low suspicion for myocarditis given lack of symptoms.  Low suspicion for ACS or PE at this time.   The patient does have known lung cancer with a left upper lobe irregular mass which has been stable.  The patient has previously been on immunotherapy for this.  No new consolidations or evidence of pulmonary edema, small hepatic cysts with no hepatic metastases, bilateral nonobstructing intrarenal calculi, sigmoid diverticulosis with no dye evidence of diverticulitis, no evidence for osseous metastatic disease all noted on his extensive work-up yesterday.  The patient is overall well-appearing and requesting discharge.  Given his work-up over the past few days, I believe that this is appropriate.  He does have a  port which has not been accessed in some time.  His blood cultures from yesterday have had no growth to date.  Low suspicion for an occult bacterial infection or sepsis at this time.  Favor likely viral illness.  Recommended close PCP follow-up for reassessment. Final Clinical Impression(s) / ED Diagnoses Final diagnoses:  Fever, unspecified fever cause    Rx / DC Orders ED Discharge Orders     None        Regan Lemming, MD 06/12/21 1654

## 2021-06-11 NOTE — ED Triage Notes (Signed)
Patient c/o weakness x 2 days and reports that his home BP was 178/103. Patient reports that his Cardiologist was called today and told to increase to 12.5 mg bid. Patient states he recently had 3 stents placed.

## 2021-06-11 NOTE — ED Notes (Signed)
Patient's neighbor updated

## 2021-06-11 NOTE — Progress Notes (Signed)
Sepsis tracking by eLINK 

## 2021-06-11 NOTE — ED Provider Notes (Signed)
Emergency Medicine Provider Triage Evaluation Note  Travis Padilla , a 75 y.o. male  was evaluated in triage.  Pt complains of fever, generalized weakness/unsteady is not on his feet, high blood pressure.  Had cardiac stents placed recently.  Symptoms of been going on for 2 days.  History of cough for several weeks, unchanged recently.  Reports interruption of his urinary stream, but no other urinary symptoms.  Review of Systems  Positive: Fever, weakness Negative: N/v/abd pain  Physical Exam  BP 138/80 (BP Location: Left Arm)   Pulse (!) 131   Temp (!) 103.2 F (39.6 C) (Oral)   Resp 18   Ht 6\' 2"  (1.88 m)   Wt 104 kg   SpO2 97%   BMI 29.44 kg/m  Gen:   Awake, no distress   Resp:  Normal effort  MSK:   Moves extremities without difficulty  Other:  No ttp of abd  Medical Decision Making  Medically screening exam initiated at 7:05 PM.  Appropriate orders placed.  [name] was informed that the remainder of the evaluation will be completed by another provider, this initial triage assessment does not replace that evaluation, and the importance of remaining in the ED until their evaluation is complete.  Labs, ua, cxr   [place], PA-C 06/11/21 Einar Crow    Regan Lemming, MD 06/11/21 2015

## 2021-06-12 LAB — URINE CULTURE: Culture: NO GROWTH

## 2021-06-12 NOTE — Telephone Encounter (Signed)
I will need a DL to make adjustments. Please ask pt to bring by machine, if you have no remote access.

## 2021-06-15 LAB — CULTURE, BLOOD (ROUTINE X 2)
Culture: NO GROWTH
Culture: NO GROWTH

## 2021-06-16 ENCOUNTER — Other Ambulatory Visit: Payer: Self-pay | Admitting: General Practice

## 2021-06-17 LAB — CULTURE, BLOOD (ROUTINE X 2)
Culture: NO GROWTH
Culture: NO GROWTH
Special Requests: ADEQUATE
Special Requests: ADEQUATE

## 2021-06-19 ENCOUNTER — Telehealth (HOSPITAL_COMMUNITY): Payer: Self-pay

## 2021-06-19 ENCOUNTER — Other Ambulatory Visit (HOSPITAL_COMMUNITY): Payer: Self-pay

## 2021-06-19 NOTE — Telephone Encounter (Signed)
Transitions of Care Pharmacy  ° °Call attempted for a pharmacy transitions of care follow-up. HIPAA appropriate voicemail was left with call back information provided.  ° °Call attempt #1. Will follow-up in 2-3 days.  °  °

## 2021-06-20 ENCOUNTER — Other Ambulatory Visit (HOSPITAL_COMMUNITY): Payer: Self-pay

## 2021-06-20 ENCOUNTER — Telehealth (HOSPITAL_COMMUNITY): Payer: Self-pay | Admitting: Pharmacist

## 2021-06-20 NOTE — Telephone Encounter (Signed)
Pharmacy Transitions of Care Follow-up Telephone Call  Date of discharge: 05/31/21  Discharge Diagnosis: unstable angina with stent placement  How have you been since you were released from the hospital?  Overall well  Medication changes made at discharge:     START taking: clopidogrel (PLAVIX)  Iron (Ferrous Sulfate)  nitroGLYCERIN (NITROSTAT)  CHANGE how you take: pantoprazole (PROTONIX)  STOP taking: carvedilol 12.5 MG tablet (COREG)  diphenhydrAMINE 50 MG tablet (BENADRYL)  Nurtec 75 MG Tbdp (Rimegepant Sulfate)  predniSONE 50 MG tablet (DELTASONE)   Medication changes verified by the patient? Yes    Medication Accessibility:  Home Pharmacy:  [facility]  Was the patient provided with refills on discharged medications? Yes   Have all prescriptions been transferred from [facility] to home pharmacy?  Yes  Is the patient able to afford medications? Has insurance, copays affordable    Medication Review:  CLOPIDOGREL (PLAVIX) Clopidogrel 75 mg once daily.  - Educated patient on expected duration of therapy of clopidogrel alone (MD recommendation to hold ASA due to GI bleed) - Advised patient of medications to avoid (NSAIDs, ASA)  - Educated that Tylenol (acetaminophen) will be the preferred analgesic to prevent risk of bleeding  - Emphasized importance of monitoring for signs and symptoms of bleeding (abnormal bruising, prolonged bleeding, nose bleeds, bleeding from gums, discolored urine, black tarry stools)  - Advised patient to alert all providers of anticoagulation therapy prior to starting a new medication or having a procedure    Follow-up Appointments:  PCP Hospital f/u appt confirmed?  Completed visit with  Dr. Lajuana Ripple on 06/05/21 [facility]   [facility] f[facility]u appt confirmed?  Saw Coletta Memos, NP for cardiology appt on 9/27. Scheduled to see Dr. [name] on 07/12/21 @ Cardiology.   If their condition worsens, is the pt aware to call PCP or go to the  Emergency Dept.? yes  Final Patient Assessment: Reviewed new meds, pt is taking appropriately and aware of all follow-up appointments.

## 2021-06-22 ENCOUNTER — Other Ambulatory Visit: Payer: Self-pay | Admitting: Physician Assistant

## 2021-06-22 ENCOUNTER — Telehealth: Payer: Self-pay | Admitting: *Deleted

## 2021-06-22 DIAGNOSIS — C3491 Malignant neoplasm of unspecified part of right bronchus or lung: Secondary | ICD-10-CM

## 2021-06-22 NOTE — Telephone Encounter (Signed)
Patient brought CPAP in today to get a DL so can see if Pressure Can be adjusted because pt stated air pressure was too high.  Report given to Dr.Ather for review

## 2021-06-22 NOTE — Telephone Encounter (Signed)
I reviewed his compliance data for the past 46 days, he used his machine 12 days, percent use days greater than 4 hours at 0%, average usage 0.8 hours.  Pressure setting is 4 cm to 10 cm.  Average AHI 7.6, mostly obstructive events, leak acceptable with a 95th percentile at 15.5 L/min, 95th percentile of pressure at 6.8 cm.  Maximum pressure for this time period was 7.4 cm.  Please advise patient that his AutoPap pressure cannot be lowered any further because the lowest pressure is typically 4 cm.  He can talk to his DME provider about additional help with tolerance and troubleshooting.  I would encourage him to make an appointment with his DME company with the respiratory therapist for further advice.

## 2021-06-22 NOTE — Telephone Encounter (Signed)
I called pt I relayed Dr. Guadelupe Sabin note and did go over the results of his latest DL (and recommendations).  I relayed to call [employer] and see RT for trouble shooting/ help with additional help with tolerance.  Pt states he wakes gasping, feels like he needs more pressure.  I relayed that his machine is a autopap and should self adjusting pressures from 4 -10cm.  He appreciated call back.  Simone placed sd card in mail per pt request.

## 2021-06-23 ENCOUNTER — Other Ambulatory Visit: Payer: Medicare Other

## 2021-06-23 ENCOUNTER — Other Ambulatory Visit: Payer: Self-pay

## 2021-06-23 DIAGNOSIS — K922 Gastrointestinal hemorrhage, unspecified: Secondary | ICD-10-CM | POA: Diagnosis not present

## 2021-06-23 LAB — CBC
Hematocrit: 35.6 % — ABNORMAL LOW (ref 37.5–51.0)
Hemoglobin: 11.8 g/dL — ABNORMAL LOW (ref 13.0–17.7)
MCH: 30.9 pg (ref 26.6–33.0)
MCHC: 33.1 g/dL (ref 31.5–35.7)
MCV: 93 fL (ref 79–97)
Platelets: 216 10*3/uL (ref 150–450)
RBC: 3.82 x10E6/uL — ABNORMAL LOW (ref 4.14–5.80)
RDW: 14.6 % (ref 11.6–15.4)
WBC: 4.4 10*3/uL (ref 3.4–10.8)

## 2021-06-26 ENCOUNTER — Other Ambulatory Visit: Payer: Medicare Other

## 2021-06-26 ENCOUNTER — Other Ambulatory Visit: Payer: Self-pay

## 2021-06-26 DIAGNOSIS — Z1211 Encounter for screening for malignant neoplasm of colon: Secondary | ICD-10-CM

## 2021-06-28 LAB — FECAL OCCULT BLOOD, IMMUNOCHEMICAL: Fecal Occult Bld: NEGATIVE

## 2021-06-28 NOTE — Progress Notes (Signed)
Cardiology Office Note   Date:  06/29/2021   ID:  Travis Padilla, Travis Padilla [birth year], MRN [id number]  PCP:  [name], DO  Cardiologist:   [name], MD   Chief Complaint  Patient presents with   Coronary Artery Disease       History of Present Illness: Travis Padilla is a 75 y.o. male who presents for follow-up of coronary artery disease.Outpatient cardiac catheterization was scheduled.he had chest discomfort earlier this year.  He underwent LHC 05/25/2021 which showed 80% mid LAD (chronic total occlusion) and sequential 70-95% proximal mid RCA lesion.  He received DES x1 to his LAD.  He had staged PCI 05/26/2021 with successful coronary arthrectomy and stenting of high-grade calcified lesions of proximal-mid RCA.  After the procedure he was noted to have anemia due to GI bleed.  His hemoglobin went from 12 down to 7.9 on 9/17.  He was noted to be FOBT positive.  His dual antiplatelet therapy was briefly held in setting of concern for cardiogenic shock.  He was bridged with cangrelor.  He received 3 units of PRBCs.  He was evaluated by GI and underwent EGD 919 which showed erosive gastropathy with no active bleeding.  It was felt that it was his prior source of melena.  His H. pylori was negative.  He received IV iron prior to discharge.  Supplemental iron was added to his medication regimen.  Recommendations to continue on antiplatelet therapy were made.  He was continued on Plavix without aspirin.  Recommendation for Protonix 40 mg daily was also made.   Has been in the emergency room a couple of times because of headaches and fevers.  I reviewed these records.  The etiology was not clear.  I do see that blood cultures were negative.  His wife is in the hospital and this has been stressful.  He is going to the gym and exercising.  He is walking through the hospital hallways.  He is not having any of the chest or neck discomfort that he was having.  He is not having any new  shortness of breath, PND or orthopnea.  He has no palpitations, presyncope or syncope.  He has no weight gain or edema.    He underwent chest CT which demonstrated aortic atherosclerosis and coronary artery calcification.  His exercise treadmill stress test demonstrated no high risk findings.  He presented to the emergency department with chest pain just prior to his office visit.  He reported that he had started using his CPAP.  After he put it on he reported he had an episode of shortness of breath and chest/neck tightness.  He felt as if he was suffocating.  He presented to the emergency department at [facility] long after having a subsequent episode discomfort post back injection.  At that time he did not wait to be seen.  His cardiac enzymes were negative.  He was encouraged to return to the emergency department and went to draw bridge.  A new right bundle branch block was noted when compared to previous EKGs.  His D-dimer was unremarkable.  His cardiac enzymes were again unremarkable.  He was discharged from the emergency room.  He again described discomfort and dull tightness in his chest which radiated to his neck.  His symptoms were happening at rest.  He did not describe shortness of breath PND or orthopnea.  He denied palpitations presyncope and syncope.     Past Medical History:  Diagnosis Date  Abnormal nuclear stress test    December, 2013   Anemia    Axillary adenopathy 04/24/2018   CAD (coronary artery disease)    Mild nonobstructive plaque in cath 2013   Chest pain    December, 2013   Cough with hemoptysis 04/22/2018   Dizziness    Dyslipidemia 07/14/2019   Encounter for antineoplastic immunotherapy 05/13/2018   Gout    Hemorrhoid    Hyperlipidemia    Hypertension    Hypothyroidism (acquired) 08/11/2018   Metastasis to lung ([place]) 05/19/2018   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) ([place]) dx'd 04/17/18   to LN, infrahilar mass, lung nodule and lt axilla    Obesity, unspecified 03/28/2009   Qualifier: Diagnosis of  By: [name], MD, [name]     Palpitations 03/28/2009   Qualifier: Diagnosis of  By: [name], MD, [name]     Right lower lobe lung mass 04/22/2018   Skin cancer    Sleep apnea    SNORING 03/28/2009   Qualifier: Diagnosis of  By: [name], MD, [name]     Spinal stenosis    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    Past Surgical History:  Procedure Laterality Date   basal skin cancer N/A 2019   Nose   BELPHAROPTOSIS REPAIR Bilateral    CARDIAC CATHETERIZATION     CATARACT EXTRACTION W/ INTRAOCULAR LENS  IMPLANT, BILATERAL     COLONOSCOPY N/A 11/03/2014   Procedure: COLONOSCOPY;  Surgeon: Rogene Houston, MD;  [facility];  Service: Endoscopy;  Laterality: N/A;  1225   CORONARY ATHERECTOMY N/A 05/26/2021   Procedure: CORONARY ATHERECTOMY;  Surgeon: Early Osmond, MD;  Location: [place] [facility];  Service: Cardiovascular;  Laterality: N/A;   CORONARY STENT INTERVENTION N/A 05/25/2021   Procedure: CORONARY STENT INTERVENTION;  Surgeon: Nelva Bush, MD;  Location: [place] [facility];  Service: Cardiovascular;  Laterality: N/A;   CORONARY STENT INTERVENTION N/A 05/26/2021   Procedure: CORONARY STENT INTERVENTION;  Surgeon: Early Osmond, MD;  Location: [place] [facility];  Service: Cardiovascular;  Laterality: N/A;   ESOPHAGOGASTRODUODENOSCOPY (EGD) WITH PROPOFOL N/A 05/29/2021   Procedure: ESOPHAGOGASTRODUODENOSCOPY (EGD) WITH PROPOFOL;  Surgeon: Gatha Mayer, MD;  Location: [place];  Service: Endoscopy;  Laterality: N/A;   INTRAVASCULAR ULTRASOUND/IVUS N/A 05/26/2021   Procedure: Intravascular Ultrasound/IVUS;  Surgeon: Early Osmond, MD;  Location: [place] [facility];  Service: Cardiovascular;  Laterality: N/A;   IR CV LINE INJECTION  08/24/2020   IR FLUORO GUIDED NEEDLE PLC ASPIRATION/INJECTION LOC  11/28/2020   IR IMAGING GUIDED PORT INSERTION  07/11/2018    KNEE CARTILAGE SURGERY Left    Left knee   LEFT HEART CATH AND CORONARY ANGIOGRAPHY N/A 05/25/2021   Procedure: LEFT HEART CATH AND CORONARY ANGIOGRAPHY;  Surgeon: Nelva Bush, MD;  Location: [place] [facility];  Service: Cardiovascular;  Laterality: N/A;   SKIN CANCER EXCISION  12/2014, 04/25/15   TEMPORARY PACEMAKER N/A 05/26/2021   Procedure: TEMPORARY PACEMAKER;  Surgeon: Early Osmond, MD;  Location: [place] [facility];  Service: Cardiovascular;  Laterality: N/A;   VIDEO BRONCHOSCOPY Bilateral 05/09/2018   Procedure: VIDEO BRONCHOSCOPY WITH FLUORO;  Surgeon: Juanito Doom, MD;  Location: [facility];  Service: Cardiopulmonary;  Laterality: Bilateral;     Current Outpatient Medications  Medication Sig Dispense Refill   acetaminophen (TYLENOL) 500 MG tablet Take 1,000 mg by mouth every 6 (six) hours as needed.  albuterol (VENTOLIN HFA) 108 (90 Base) MCG/ACT inhaler Inhale 2 puffs into the lungs every 6 (six) hours as needed for wheezing or shortness of breath. 8 g 6   ALPRAZolam (XANAX) 0.25 MG tablet Take 1 tablet (0.25 mg total) by mouth at bedtime as needed for anxiety. 30 tablet 0   Artificial Tear Solution (SOOTHE XP OP) Place 1 drop into both eyes 2 (two) times daily.     carvedilol (COREG) 3.125 MG tablet TAKE 1 TABLET BY MOUTH 2 TIMES DAILY. 60 tablet 3   clopidogrel (PLAVIX) 75 MG tablet Take 1 tablet (75 mg total) by mouth daily. 90 tablet 2   fluticasone (FLONASE) 50 MCG/ACT nasal spray Place 1 spray into both nostrils daily. (Patient taking differently: Place 1 spray into both nostrils daily as needed for allergies.) 16 g 3   Fluticasone-Umeclidin-Vilant (TRELEGY ELLIPTA) 100-62.5-25 MCG/INH AEPB Inhale 1 puff into the lungs daily. 60 each 6   Iron, Ferrous Sulfate, 325 (65 Fe) MG TABS Take 325 mg by mouth daily. 30 tablet 2   levothyroxine (SYNTHROID) 150 MCG tablet TAKE 1 TABLET BY MOUTH DAILY BEFORE BREAKFAST. 30 tablet 2   methocarbamol (ROBAXIN) 500 MG  tablet Take 500 mg by mouth every 6 (six) hours as needed for muscle spasms.     nitroGLYCERIN (NITROSTAT) 0.4 MG SL tablet Place 1 tablet (0.4 mg total) under the tongue every 5 (five) minutes as needed for chest pain. 25 tablet 2   pantoprazole (PROTONIX) 40 MG tablet Take 1 tablet (40 mg total) by mouth daily before breakfast. 90 tablet 0   polyethylene glycol powder (GLYCOLAX/MIRALAX) 17 GM/SCOOP powder Take 1 Container by mouth daily.     PRALUENT 150 MG/ML SOAJ INJECT 150 MG INTO THE SKIN EVERY 14 (FOURTEEN) DAYS. 6 mL 3   senna-docusate (SENNA S) 8.6-50 MG tablet 1 to 2 twice daily for constipation (Patient taking differently: Take 1-2 tablets by mouth 2 (two) times daily as needed for mild constipation or moderate constipation.) 120 tablet 5   sulfamethoxazole-trimethoprim (BACTRIM DS) 800-160 MG tablet Take 1 tablet by mouth 2 (two) times daily.     tamsulosin (FLOMAX) 0.4 MG CAPS capsule Take 0.4 mg by mouth at bedtime.     No current facility-administered medications for this visit.    Allergies:   Iohexol, Pravastatin, and Iodine    Social History:  The patient  reports that he quit smoking about 50 years ago. His smoking use included cigarettes. He started smoking about 52 years ago. He has a 0.40 pack-year smoking history. He has never used smokeless tobacco. He reports that he does not drink alcohol and does not use drugs.   Family History:  The patient's family history includes COPD in his brother; Epilepsy in his brother; Healthy in his sister, sister, and sister; Heart attack in his father; Heart failure in his mother; Neuropathy in his brother; Parkinson's disease in his mother; Skin cancer in his sister.    ROS:  Please see the history of present illness.   Otherwise, review of systems are positive for none.   All other systems are reviewed and negative.    PHYSICAL EXAM: VS:  BP 115/70 (BP Location: Right Arm, Patient Position: Sitting, Cuff Size: Normal)   Pulse (!) 104    Ht 6\' 2"  (1.88 m)   Wt 222 lb 9.6 oz (101 kg)   SpO2 98%   BMI 28.58 kg/m  , BMI Body mass index is 28.58 kg/m. GENERAL:  Well appearing NECK:  No jugular venous distention, waveform within normal limits, carotid upstroke brisk and symmetric, no bruits, no thyromegaly LUNGS:  Clear to auscultation bilaterally CHEST:  Unremarkable HEART:  PMI not displaced or sustained,S1 and S2 within normal limits, no S3, no S4, no clicks, no rubs, no murmurs ABD:  Flat, positive bowel sounds normal in frequency in pitch, no bruits, no rebound, no guarding, no midline pulsatile mass, no hepatomegaly, no splenomegaly EXT:  2 plus pulses throughout, no edema, no cyanosis no clubbing   EKG:  EKG is not ordered today.   Recent Labs: 11/28/2020: B Natriuretic Peptide 479.9 05/30/2021: Magnesium 2.0 06/05/2021: TSH 0.472 06/11/2021: ALT 12; BUN 15; Creatinine, Ser 1.42; Potassium 3.8; Sodium 141 06/23/2021: Hemoglobin 11.8; Platelets 216    Lipid Panel    Component Value Date/Time   CHOL 123 05/24/2021 1151   TRIG 191 (H) 05/24/2021 1151   HDL 38 (L) 05/24/2021 1151   CHOLHDL 3.2 05/24/2021 1151   LDLCALC 54 05/24/2021 1151      Wt Readings from Last 3 Encounters:  06/29/21 222 lb 9.6 oz (101 kg)  06/11/21 229 lb 4.5 oz (104 kg)  06/10/21 229 lb 4.5 oz (104 kg)      Other studies Reviewed: Additional studies/ records that were reviewed today include: ED records. Review of the above records demonstrates:  Please see elsewhere in the note.     ASSESSMENT AND PLAN:   CAD/Unstable Angina: He is status post PCI as above.  He remains on monotherapy because of the GI bleeding.   He is having no new symptoms.  No change in therapy.  HTN:   Has been elevated and was in the emergency room but is now down with increasing doses of beta-blocker.  He will remain on the meds as listed.    HLD:   LDL was 54 most recently.  No change in therapy.   COPD/OSA:     He has not yet been able to wear his  CPAP and is behind on working with the company and his pulmonologist because of his wife's illness.  I asked  Anemia:   His hemoglobin was 11.8 and stable in the emergency room.  Fecal occult blood was negative.    Fever: Blood cultures were negative.   Current medicines are reviewed at length with the patient today.  The patient does not have concerns regarding medicines.  The following changes have been made:  no change  Labs/ tests ordered today include: None No orders of the defined types were placed in this encounter.    Disposition:   FU with APP in six months     Signed, [name], MD  06/29/2021 4:34 PM    [facility]

## 2021-06-29 ENCOUNTER — Encounter: Payer: Self-pay | Admitting: Cardiology

## 2021-06-29 ENCOUNTER — Other Ambulatory Visit: Payer: Self-pay

## 2021-06-29 ENCOUNTER — Ambulatory Visit (INDEPENDENT_AMBULATORY_CARE_PROVIDER_SITE_OTHER): Payer: Medicare Other | Admitting: Cardiology

## 2021-06-29 VITALS — BP 115/70 | HR 104 | Ht 74.0 in | Wt 222.6 lb

## 2021-06-29 DIAGNOSIS — I200 Unstable angina: Secondary | ICD-10-CM

## 2021-06-29 DIAGNOSIS — R509 Fever, unspecified: Secondary | ICD-10-CM

## 2021-06-29 DIAGNOSIS — E782 Mixed hyperlipidemia: Secondary | ICD-10-CM

## 2021-06-29 DIAGNOSIS — I2510 Atherosclerotic heart disease of native coronary artery without angina pectoris: Secondary | ICD-10-CM | POA: Diagnosis not present

## 2021-06-29 DIAGNOSIS — I10 Essential (primary) hypertension: Secondary | ICD-10-CM

## 2021-06-29 NOTE — Patient Instructions (Signed)
Medication Instructions:  No changes *If you need a refill on your cardiac medications before your next appointment, please call your pharmacy*   Lab Work: None ordered If you have labs (blood work) drawn today and your tests are completely normal, you will receive your results only by: [place] (if you have MyChart) OR A paper copy in the mail If you have any lab test that is abnormal or we need to change your treatment, we will call you to review the results.   Testing/Procedures: None ordered   Follow-Up: At [facility], you and your health needs are our priority.  As part of our continuing mission to provide you with exceptional heart care, we have created designated Provider Care Teams.  These Care Teams include your primary Cardiologist (physician) and Advanced Practice Providers (APPs -  Physician Assistants and Nurse Practitioners) who all work together to provide you with the care you need, when you need it.  We recommend signing up for the patient portal called "MyChart".  Sign up information is provided on this After Visit Summary.  MyChart is used to connect with patients for Virtual Visits (Telemedicine).  Patients are able to view lab/test results, encounter notes, upcoming appointments, etc.  Non-urgent messages can be sent to your provider as well.   To learn more about what you can do with MyChart, go to [email or web address].    Your next appointment:   6 month(s)  The format for your next appointment:   In Person  Provider:   You will see one of the following Advanced Practice Providers on your designated Care Team:   Rosaria Ferries, PA-C Caron Presume, PA-C Jory Sims, DNP, ANP

## 2021-07-06 ENCOUNTER — Ambulatory Visit: Payer: Medicare Other

## 2021-07-10 ENCOUNTER — Telehealth: Payer: Self-pay | Admitting: *Deleted

## 2021-07-10 NOTE — Chronic Care Management (AMB) (Signed)
  Chronic Care Management   Note  07/10/2021 Name: Travis Padilla MRN: [id number] DOB: Nov 17, 1945  Travis Padilla is a 75 y.o. year old male who is a primary care patient of [name], DO. I reached out to [name] by phone today in response to a referral sent by Travis Padilla's PCP.  Travis Padilla was given information about Chronic Care Management services today including:  CCM service includes personalized support from designated clinical staff supervised by his physician, including individualized plan of care and coordination with other care providers 24/7 contact phone numbers for assistance for urgent and routine care needs. Service will only be billed when office clinical staff spend 20 minutes or more in a month to coordinate care. Only one practitioner may furnish and bill the service in a calendar month. The patient may stop CCM services at any time (effective at the end of the month) by phone call to the office staff. The patient is responsible for co-pay (up to 20% after annual deductible is met) if co-pay is required by the individual health plan.   Patient agreed to services and verbal consent obtained.   Follow up plan: Telephone appointment with care management team member scheduled for:07/14/21  [place] [facility]  Direct Dial: [phone]

## 2021-07-10 NOTE — Chronic Care Management (AMB) (Signed)
  Chronic Care Management   Outreach Note  07/10/2021 Name: [name] MRN: [id number] DOB: 1946-07-24  Travis Padilla is a 75 y.o. year old male who is a primary care patient of [name], DO. I reached out to [name] by phone today in response to a referral sent by Mr. Murdock Jellison Mowers's primary care provider.  An unsuccessful telephone outreach was attempted today. The patient was referred to the case management team for assistance with care management and care coordination.   Follow Up Plan: A HIPAA compliant phone message was left for the patient providing contact information and requesting a return call.  The care management team will reach out to the patient again over the next 7 days.  If patient returns call to provider office, please advise to call [place]* at [place] [facility]  Direct Dial: [phone]

## 2021-07-11 ENCOUNTER — Telehealth (HOSPITAL_COMMUNITY): Payer: Self-pay

## 2021-07-11 NOTE — Telephone Encounter (Signed)
Pt no longer interested in the cardiac rehab program. Closed referral.

## 2021-07-12 ENCOUNTER — Ambulatory Visit: Payer: Medicare Other | Admitting: Cardiology

## 2021-07-13 DIAGNOSIS — R3912 Poor urinary stream: Secondary | ICD-10-CM | POA: Diagnosis not present

## 2021-07-13 DIAGNOSIS — N200 Calculus of kidney: Secondary | ICD-10-CM | POA: Diagnosis not present

## 2021-07-13 DIAGNOSIS — Z125 Encounter for screening for malignant neoplasm of prostate: Secondary | ICD-10-CM | POA: Diagnosis not present

## 2021-07-13 DIAGNOSIS — N401 Enlarged prostate with lower urinary tract symptoms: Secondary | ICD-10-CM | POA: Diagnosis not present

## 2021-07-14 ENCOUNTER — Ambulatory Visit: Payer: Medicare Other | Admitting: *Deleted

## 2021-07-14 DIAGNOSIS — J42 Unspecified chronic bronchitis: Secondary | ICD-10-CM

## 2021-07-14 DIAGNOSIS — I10 Essential (primary) hypertension: Secondary | ICD-10-CM

## 2021-07-14 NOTE — Patient Instructions (Signed)
Travis Padilla was given information about Chronic Care Management services today including:  CCM service includes personalized support from designated clinical staff supervised by his physician, including individualized plan of care and coordination with other care providers 24/7 contact phone numbers for assistance for urgent and routine care needs. Service will only be billed when office clinical staff spend 20 minutes or more in a month to coordinate care. Only one practitioner may furnish and bill the service in a calendar month. The patient may stop CCM services at any time (effective at the end of the month) by phone call to the office staff. The patient will be responsible for cost sharing (co-pay) of up to 20% of the service fee (after annual deductible is met).   Patient wishes to consider information provided and/or speak with a member of the care team before deciding about enrollment in care management services.    Plan:The patient has been provided with contact information for the care management team and has been advised to call with any health related questions or concerns.  The patient will be removed from the CCM program per his request and the CCM Team will be available to assist patient with Care Management and Care Coordination Services if he or is provider feel that they are necessary in the future.   Chong Sicilian, BSN, RN-BC Embedded Chronic Care Manager [facility] [place] [facility] Direct Dial: [phone]

## 2021-07-14 NOTE — Chronic Care Management (AMB) (Signed)
  Chronic Care Management   CCM RN Visit Note  07/14/2021 Name: Travis Padilla MRN: [id number] DOB: May 29, 1946  Subjective: Travis Padilla is a 75 y.o. year old male who is a primary care patient of [name], DO. The care management team was consulted for assistance with disease management and care coordination needs.    Engaged with patient by telephone for initial visit in response to provider referral for case management and/or care coordination services.   Consent to Services:  The patient was given the following information about Chronic Care Management services: 1. CCM service includes personalized support from designated clinical staff supervised by the primary care provider, including individualized plan of care and coordination with other care providers 2. 24/7 contact phone numbers for assistance for urgent and routine care needs. 3. Service will only be billed when office clinical staff spend 20 minutes or more in a month to coordinate care. 4. Only one practitioner may furnish and bill the service in a calendar month. 5.The patient may stop CCM services at any time (effective at the end of the month) by phone call to the office staff. 6. The patient will be responsible for cost sharing (co-pay) of up to 20% of the service fee (after annual deductible is met). Patient agreed to services and consent obtained.  Patient did not agree to services today but will reach out to [facility] Team after discussion with PCP if he decides services are needed.  Patient was assessed and provided with appropriate verbal education regarding management of his chronic medical conditions.   Plan:The patient has been provided with contact information for the care management team and has been advised to call with any health related questions or concerns.  The patient will be removed from the CCM program per his request and the CCM Team will be available to assist patient with Care Management and Care  Coordination Services if he or is provider feel that they are necessary in the future.   Chong Sicilian, BSN, RN-BC Embedded Chronic Care Manager [facility] [place] [facility] Direct Dial: [phone]

## 2021-07-18 ENCOUNTER — Ambulatory Visit: Payer: Medicare Other

## 2021-07-20 ENCOUNTER — Telehealth: Payer: Self-pay | Admitting: *Deleted

## 2021-07-20 NOTE — Telephone Encounter (Signed)
I called pt and he has not used his machine as pts [place] has been in [facility] [place] 5wks and now to rehab.  He was set up 05-08-21.  I relayed that MCR requires compliance 31-89 days from initial start, 30 days of 4 hours or greater use to continue with machine.  He understands.  The end of compliance would be 08-08-21.  He relayed will need to call back to reschedule.  I cancelled the appt for him on Monday. 07-24-21.

## 2021-07-24 ENCOUNTER — Other Ambulatory Visit: Payer: Self-pay

## 2021-07-24 ENCOUNTER — Encounter: Payer: Self-pay | Admitting: Family Medicine

## 2021-07-24 ENCOUNTER — Ambulatory Visit (INDEPENDENT_AMBULATORY_CARE_PROVIDER_SITE_OTHER): Payer: Medicare Other | Admitting: Family Medicine

## 2021-07-24 ENCOUNTER — Ambulatory Visit: Payer: Medicare Other | Admitting: Neurology

## 2021-07-24 VITALS — BP 110/72 | HR 77 | Temp 98.3°F | Ht 74.0 in | Wt 220.8 lb

## 2021-07-24 DIAGNOSIS — D509 Iron deficiency anemia, unspecified: Secondary | ICD-10-CM

## 2021-07-24 DIAGNOSIS — Z6379 Other stressful life events affecting family and household: Secondary | ICD-10-CM | POA: Diagnosis not present

## 2021-07-24 DIAGNOSIS — E039 Hypothyroidism, unspecified: Secondary | ICD-10-CM | POA: Diagnosis not present

## 2021-07-24 NOTE — Progress Notes (Signed)
Subjective: CC: Follow-up GI bleed PCP: [name], DO VZD:GLOV Travis Padilla is a 76 y.o. male presenting to clinic today for:  1.  History of GI bleed Patient is compliant with PPI.  He is wondering if he needs to stay on this.  He has not seen a gastroenterologist yet.  He felt reassured after FOBT was negative but does admit that he has had intermittent blood in stool.  No reports of feeling faint or dizzy.  Currently utilizing the alprazolam only about once per week when he cannot sleep.  He admits he gets "dark butterflies" in his stomach sometimes which he thinks is probably a manifestation of his stress due to his wife's illness.  2.  Hypothyroidism He admits to some occasional night sweats.  No reports of change in weight, difficulty swallowing or heart palpitations   ROS: Per HPI  Allergies  Allergen Reactions   Iohexol Rash    CT scan contrast caused rash and itching    does fine with premeds.   Pravastatin     Numbness in feet    Iodine Hives and Rash    CT scan contrast caused rash and itching    Past Medical History:  Diagnosis Date   Abnormal nuclear stress test    December, 2013   Anemia    Axillary adenopathy 04/24/2018   CAD (coronary artery disease)    Mild nonobstructive plaque in cath 2013   Chest pain    December, 2013   Cough with hemoptysis 04/22/2018   Dizziness    Dyslipidemia 07/14/2019   Encounter for antineoplastic immunotherapy 05/13/2018   Gout    Hemorrhoid    Hyperlipidemia    Hypertension    Hypothyroidism (acquired) 08/11/2018   Metastasis to lung ([place]) 05/19/2018   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) ([place]) dx'd 04/17/18   to LN, infrahilar mass, lung nodule and lt axilla   Obesity, unspecified 03/28/2009   Qualifier: Diagnosis of  By: [name], MD, [name]     Palpitations 03/28/2009   Qualifier: Diagnosis of  By: [name], MD, [name]     Right lower lobe lung mass 04/22/2018   Skin cancer     Sleep apnea    SNORING 03/28/2009   Qualifier: Diagnosis of  By: [name], MD, [name]     Spinal stenosis    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    Current Outpatient Medications:    acetaminophen (TYLENOL) 500 MG tablet, Take 1,000 mg by mouth every 6 (six) hours as needed., Disp: , Rfl:    albuterol (VENTOLIN HFA) 108 (90 Base) MCG/ACT inhaler, Inhale 2 puffs into the lungs every 6 (six) hours as needed for wheezing or shortness of breath., Disp: 8 g, Rfl: 6   ALPRAZolam (XANAX) 0.25 MG tablet, Take 1 tablet (0.25 mg total) by mouth at bedtime as needed for anxiety., Disp: 30 tablet, Rfl: 0   Artificial Tear Solution (SOOTHE XP OP), Place 1 drop into both eyes 2 (two) times daily., Disp: , Rfl:    carvedilol (COREG) 3.125 MG tablet, TAKE 1 TABLET BY MOUTH 2 TIMES DAILY., Disp: 60 tablet, Rfl: 3   clopidogrel (PLAVIX) 75 MG tablet, Take 1 tablet (75 mg total) by mouth daily., Disp: 90 tablet, Rfl: 2   fluticasone (FLONASE) 50 MCG/ACT nasal spray, Place 1 spray into both nostrils daily. (Patient taking differently: Place 1 spray into both nostrils daily as needed for allergies.), Disp: 16  g, Rfl: 3   Fluticasone-Umeclidin-Vilant (TRELEGY ELLIPTA) 100-62.5-25 MCG/INH AEPB, Inhale 1 puff into the lungs daily., Disp: 60 each, Rfl: 6   Iron, Ferrous Sulfate, 325 (65 Fe) MG TABS, Take 325 mg by mouth daily., Disp: 30 tablet, Rfl: 2   levothyroxine (SYNTHROID) 150 MCG tablet, TAKE 1 TABLET BY MOUTH DAILY BEFORE BREAKFAST., Disp: 30 tablet, Rfl: 2   methocarbamol (ROBAXIN) 500 MG tablet, Take 500 mg by mouth every 6 (six) hours as needed for muscle spasms., Disp: , Rfl:    nitroGLYCERIN (NITROSTAT) 0.4 MG SL tablet, Place 1 tablet (0.4 mg total) under the tongue every 5 (five) minutes as needed for chest pain., Disp: 25 tablet, Rfl: 2   pantoprazole (PROTONIX) 40 MG tablet, Take 1 tablet (40 mg total) by mouth daily before breakfast., Disp: 90 tablet, Rfl: 0    polyethylene glycol powder (GLYCOLAX/MIRALAX) 17 GM/SCOOP powder, Take 1 Container by mouth daily., Disp: , Rfl:    PRALUENT 150 MG/ML SOAJ, INJECT 150 MG INTO THE SKIN EVERY 14 (FOURTEEN) DAYS., Disp: 6 mL, Rfl: 3   senna-docusate (SENNA S) 8.6-50 MG tablet, 1 to 2 twice daily for constipation (Patient taking differently: Take 1-2 tablets by mouth 2 (two) times daily as needed for mild constipation or moderate constipation.), Disp: 120 tablet, Rfl: 5   sulfamethoxazole-trimethoprim (BACTRIM DS) 800-160 MG tablet, Take 1 tablet by mouth 2 (two) times daily., Disp: , Rfl:    tamsulosin (FLOMAX) 0.4 MG CAPS capsule, Take 0.4 mg by mouth at bedtime., Disp: , Rfl:  Social History   Socioeconomic History   Marital status: Married    Spouse name: Not on file   Number of children: 1   Years of education: Not on file   Highest education level: Not on file  Occupational History   Occupation: Government social research officer  Tobacco Use   Smoking status: Former    Packs/day: 0.20    Years: 2.00    Pack years: 0.40    Types: Cigarettes    Start date: 10/01/1968    Quit date: 1972    Years since quitting: 50.9   Smokeless tobacco: Never  Vaping Use   Vaping Use: Never used  Substance and Sexual Activity   Alcohol use: No    Comment: hx of    Drug use: No   Sexual activity: Not on file  Other Topics Concern   Not on file  Social History Narrative   Unable to ask intimate partner violence questions, wife present   Social Determinants of Health   Financial Resource Strain: Low Risk    Difficulty of Paying Living Expenses: Not hard at all  Food Insecurity: No Food Insecurity   Worried About Charity fundraiser in the Last Year: Never true   [place] in the Last Year: Never true  Transportation Needs: No Transportation Needs   Lack of Transportation (Medical): No   Lack of Transportation (Non-Medical): No  Physical Activity: Insufficiently Active   Days of Exercise per Week: 2 days   Minutes of  Exercise per Session: 60 min  Stress: Not on file  Social Connections: Not on file  Intimate Partner Violence: Not on file   Family History  Problem Relation Age of Onset   Heart attack Father    Heart failure Mother    Parkinson's disease Mother    Healthy Sister    Skin cancer Sister    Neuropathy Brother    COPD Brother    Epilepsy Brother  Healthy Sister    Healthy Sister    Colon cancer Neg Hx     Objective: Office vital signs reviewed. BP 110/72   Pulse 77   Temp 98.3 F (36.8 C)   Ht 6\' 2"  (1.88 m)   Wt 220 lb 12.8 oz (100.2 kg)   SpO2 99%   BMI 28.35 kg/m   Physical Examination:  General: Awake, alert, well nourished, No acute distress Cardio: regular rate and rhythm, S1S2 heard, no murmurs appreciated Pulm: clear to auscultation bilaterally, no wheezes, rhonchi or rales; normal work of breathing on room air Psych: Mood is depressed and he is intermittently tearful when talking about his wife.  Patient is pleasant.  He is interactive  Depression screen [facility] 2/9 07/24/2021 01/17/2021 12/20/2020  Decreased Interest 1 0 0  Down, Depressed, Hopeless 1 0 0  PHQ - 2 Score 2 0 0  Altered sleeping 1 2 -  Tired, decreased energy 1 1 -  Change in appetite 0 0 -  Feeling bad or failure about yourself  1 0 -  Trouble concentrating 0 1 -  Moving slowly or fidgety/restless 0 0 -  Suicidal thoughts 0 0 -  PHQ-9 Score 5 4 -  Difficult doing work/chores Somewhat difficult Not difficult at all -  Some recent data might be hidden   GAD 7 : Generalized Anxiety Score 07/24/2021 01/17/2021 12/08/2020 11/03/2019  Nervous, Anxious, on Edge 1 1 0 0  Control/stop worrying 1 0 0 0  Worry too much - different things 1 0 0 2  Trouble relaxing 1 0 0 0  Restless 1 1 0 0  Easily annoyed or irritable 1 1 0 0  Afraid - awful might happen 1 - 0 0  Total GAD 7 Score 7 - 0 2  Anxiety Difficulty Somewhat difficult Not difficult at all Not difficult at all Not difficult at all       Assessment/ Plan: 75 y.o. male   Iron deficiency anemia, unspecified iron deficiency anemia type - Plan: Ferritin, CBC, Iron  Hypothyroidism (acquired) - Plan: TSH, T4, Free  Stress due to illness of family member  Check ferritin, CBC and iron level.  We discussed that we should still consider evaluation by gastroenterology given recent events.  His FOBT was negative but he is directly visualized blood in his stool intermittently  Check TSH, free T4 given reports of some night sweats.  He continues to struggle quite a bit with the illness of his wife.  We discussed consideration for longer acting medication such as Zoloft versus SNRI.  I favor the latter given history of arrhythmia.  I think this would help some of the GI manifestations he has been experiencing as well  No orders of the defined types were placed in this encounter.  No orders of the defined types were placed in this encounter.    [name], DO [place] [phone]

## 2021-07-24 NOTE — Patient Instructions (Signed)
Considering selective serotonin reuptake inhibitor versus selective norepinephrine reuptake inhibitor for assistance with anxiety, depression and GI symptoms.  Some of these medications within these categories include Zoloft, Prozac and Effexor.

## 2021-07-25 ENCOUNTER — Other Ambulatory Visit: Payer: Self-pay | Admitting: Family Medicine

## 2021-07-25 ENCOUNTER — Encounter: Payer: Self-pay | Admitting: Family Medicine

## 2021-07-25 DIAGNOSIS — E039 Hypothyroidism, unspecified: Secondary | ICD-10-CM

## 2021-07-25 LAB — CBC
Hematocrit: 41.1 % (ref 37.5–51.0)
Hemoglobin: 13.4 g/dL (ref 13.0–17.7)
MCH: 31.1 pg (ref 26.6–33.0)
MCHC: 32.6 g/dL (ref 31.5–35.7)
MCV: 95 fL (ref 79–97)
Platelets: 185 10*3/uL (ref 150–450)
RBC: 4.31 x10E6/uL (ref 4.14–5.80)
RDW: 14 % (ref 11.6–15.4)
WBC: 5.2 10*3/uL (ref 3.4–10.8)

## 2021-07-25 LAB — TSH: TSH: 40.5 u[IU]/mL — ABNORMAL HIGH (ref 0.450–4.500)

## 2021-07-25 LAB — FERRITIN: Ferritin: 80 ng/mL (ref 30–400)

## 2021-07-25 LAB — IRON: Iron: 86 ug/dL (ref 38–169)

## 2021-07-25 LAB — T4, FREE: Free T4: 0.76 ng/dL — ABNORMAL LOW (ref 0.82–1.77)

## 2021-07-25 MED ORDER — LEVOTHYROXINE SODIUM 175 MCG PO TABS: 175.0000 ug | ORAL_TABLET | Freq: Every day | ORAL | 0 refills | Status: DC

## 2021-07-31 ENCOUNTER — Encounter: Payer: Self-pay | Admitting: Internal Medicine

## 2021-08-01 ENCOUNTER — Other Ambulatory Visit: Payer: Self-pay

## 2021-08-01 DIAGNOSIS — Z91041 Radiographic dye allergy status: Secondary | ICD-10-CM

## 2021-08-01 MED ORDER — DIPHENHYDRAMINE HCL 50 MG PO TABS: 50.0000 mg | ORAL_TABLET | Freq: Once | ORAL | 0 refills | Status: DC

## 2021-08-01 MED ORDER — PREDNISONE 50 MG PO TABS: ORAL_TABLET | ORAL | 1 refills | Status: DC

## 2021-08-17 ENCOUNTER — Telehealth: Payer: Self-pay | Admitting: Cardiology

## 2021-08-17 ENCOUNTER — Other Ambulatory Visit: Payer: Self-pay | Admitting: Cardiology

## 2021-08-17 NOTE — Telephone Encounter (Signed)
*  STAT* If patient is at the pharmacy, call can be transferred to refill team.   1. Which medications need to be refilled? (please list name of each medication and dose if known)  clopidogrel (PLAVIX) 75 MG tablet pantoprazole (PROTONIX) 40 MG tablet  2. Which pharmacy/location (including street and city if local pharmacy) is medication to be sent to? [id number] - [place], [place] [address]  3. Do they need a 30 day or 90 day supply? 90 day

## 2021-08-18 ENCOUNTER — Inpatient Hospital Stay: Payer: Medicare Other | Attending: Internal Medicine

## 2021-08-18 ENCOUNTER — Ambulatory Visit (HOSPITAL_COMMUNITY)
Admission: RE | Admit: 2021-08-18 | Discharge: 2021-08-18 | Disposition: A | Discharge status: Home / Self Care | Payer: Medicare Other | Source: Ambulatory Visit | Attending: Internal Medicine | Admitting: Internal Medicine

## 2021-08-18 ENCOUNTER — Inpatient Hospital Stay: Payer: Medicare Other

## 2021-08-18 ENCOUNTER — Other Ambulatory Visit: Payer: Self-pay

## 2021-08-18 DIAGNOSIS — E039 Hypothyroidism, unspecified: Secondary | ICD-10-CM | POA: Insufficient documentation

## 2021-08-18 DIAGNOSIS — C3492 Malignant neoplasm of unspecified part of left bronchus or lung: Secondary | ICD-10-CM | POA: Insufficient documentation

## 2021-08-18 DIAGNOSIS — I2510 Atherosclerotic heart disease of native coronary artery without angina pectoris: Secondary | ICD-10-CM | POA: Insufficient documentation

## 2021-08-18 DIAGNOSIS — R911 Solitary pulmonary nodule: Secondary | ICD-10-CM | POA: Diagnosis not present

## 2021-08-18 DIAGNOSIS — C3490 Malignant neoplasm of unspecified part of unspecified bronchus or lung: Secondary | ICD-10-CM | POA: Diagnosis not present

## 2021-08-18 DIAGNOSIS — N200 Calculus of kidney: Secondary | ICD-10-CM | POA: Insufficient documentation

## 2021-08-18 DIAGNOSIS — I700 Atherosclerosis of aorta: Secondary | ICD-10-CM | POA: Insufficient documentation

## 2021-08-18 DIAGNOSIS — N281 Cyst of kidney, acquired: Secondary | ICD-10-CM | POA: Diagnosis not present

## 2021-08-18 DIAGNOSIS — R918 Other nonspecific abnormal finding of lung field: Secondary | ICD-10-CM | POA: Diagnosis not present

## 2021-08-18 DIAGNOSIS — K7689 Other specified diseases of liver: Secondary | ICD-10-CM | POA: Diagnosis not present

## 2021-08-18 DIAGNOSIS — C3491 Malignant neoplasm of unspecified part of right bronchus or lung: Secondary | ICD-10-CM

## 2021-08-18 DIAGNOSIS — I10 Essential (primary) hypertension: Secondary | ICD-10-CM | POA: Insufficient documentation

## 2021-08-18 LAB — CMP (CANCER CENTER ONLY)
ALT: 13 U/L (ref 0–44)
AST: 18 U/L (ref 15–41)
Albumin: 4.4 g/dL (ref 3.5–5.0)
Alkaline Phosphatase: 60 U/L (ref 38–126)
Anion gap: 10 (ref 5–15)
BUN: 17 mg/dL (ref 8–23)
CO2: 25 mmol/L (ref 22–32)
Calcium: 9.3 mg/dL (ref 8.9–10.3)
Chloride: 106 mmol/L (ref 98–111)
Creatinine: 1.13 mg/dL (ref 0.61–1.24)
GFR, Estimated: >60 mL/min (ref >60)
Glucose, Bld: 161 mg/dL — ABNORMAL HIGH (ref 70–99)
Potassium: 4.4 mmol/L (ref 3.5–5.1)
Sodium: 141 mmol/L (ref 135–145)
Total Bilirubin: 0.5 mg/dL (ref 0.3–1.2)
Total Protein: 7.9 g/dL (ref 6.5–8.1)

## 2021-08-18 LAB — CBC WITH DIFFERENTIAL (CANCER CENTER ONLY)
Abs Immature Granulocytes: 0.03 10*3/uL (ref 0.00–0.07)
Basophils Absolute: 0 10*3/uL (ref 0.0–0.1)
Basophils Relative: 0 %
Eosinophils Absolute: 0 10*3/uL (ref 0.0–0.5)
Eosinophils Relative: 0 %
HCT: 42 % (ref 39.0–52.0)
Hemoglobin: 13.9 g/dL (ref 13.0–17.0)
Immature Granulocytes: 1 %
Lymphocytes Relative: 10 %
Lymphs Abs: 0.5 10*3/uL — ABNORMAL LOW (ref 0.7–4.0)
MCH: 31.3 pg (ref 26.0–34.0)
MCHC: 33.1 g/dL (ref 30.0–36.0)
MCV: 94.6 fL (ref 80.0–100.0)
Monocytes Absolute: 0.1 10*3/uL (ref 0.1–1.0)
Monocytes Relative: 2 %
Neutro Abs: 4.3 10*3/uL (ref 1.7–7.7)
Neutrophils Relative %: 87 %
Platelet Count: 186 10*3/uL (ref 150–400)
RBC: 4.44 MIL/uL (ref 4.22–5.81)
RDW: 13 % (ref 11.5–15.5)
WBC Count: 4.9 10*3/uL (ref 4.0–10.5)
nRBC: 0 % (ref 0.0–0.2)

## 2021-08-18 LAB — TSH: TSH: 2.424 u[IU]/mL (ref 0.320–4.118)

## 2021-08-18 MED ORDER — IOHEXOL 350 MG/ML SOLN
80.0000 mL | Freq: Once | INTRAVENOUS | Status: AC | PRN
  Administered 2021-08-18: 80 mL via INTRAVENOUS

## 2021-08-18 MED ORDER — CLOPIDOGREL BISULFATE 75 MG PO TABS: 75.0000 mg | ORAL_TABLET | Freq: Every day | ORAL | 2 refills | Status: DC

## 2021-08-18 MED ORDER — SODIUM CHLORIDE (PF) 0.9 % IJ SOLN
INTRAMUSCULAR | Status: AC
  Filled 2021-08-18: qty 50

## 2021-08-18 MED ORDER — PANTOPRAZOLE SODIUM 40 MG PO TBEC: 40.0000 mg | DELAYED_RELEASE_TABLET | Freq: Every day | ORAL | 2 refills | Status: DC

## 2021-08-18 NOTE — Telephone Encounter (Signed)
Noted. Refilled Plavix 75 mg one tab daily & Pantoprazole 40 mg one pill daily, #90 w 2 rfs. Spoke with pt. Pt is aware that medication was sent to [place], [place] as directed.

## 2021-08-21 ENCOUNTER — Inpatient Hospital Stay (HOSPITAL_BASED_OUTPATIENT_CLINIC_OR_DEPARTMENT_OTHER): Payer: Medicare Other | Admitting: Internal Medicine

## 2021-08-21 ENCOUNTER — Other Ambulatory Visit: Payer: Self-pay

## 2021-08-21 VITALS — BP 118/96 | HR 61 | Temp 97.8°F | Resp 19 | Ht 74.0 in | Wt 215.1 lb

## 2021-08-21 DIAGNOSIS — E039 Hypothyroidism, unspecified: Secondary | ICD-10-CM | POA: Diagnosis not present

## 2021-08-21 DIAGNOSIS — C3490 Malignant neoplasm of unspecified part of unspecified bronchus or lung: Secondary | ICD-10-CM

## 2021-08-21 DIAGNOSIS — C3492 Malignant neoplasm of unspecified part of left bronchus or lung: Secondary | ICD-10-CM | POA: Diagnosis not present

## 2021-08-21 DIAGNOSIS — I10 Essential (primary) hypertension: Secondary | ICD-10-CM | POA: Diagnosis not present

## 2021-08-21 NOTE — Progress Notes (Signed)
[place] [phone]   [phone]  OFFICE PROGRESS NOTE  Travis Norlander, DO [place] [address]  DIAGNOSIS: Stage IV (T3, N0, M1c) non-small cell lung cancer, squamous cell carcinoma presented with large right infrahilar mass in addition to left upper lobe lung nodule as well as left axillary mass with left axillary lymph node diagnosed in August 2019.  PRIOR THERAPY:  1) Palliative radiotherapy to the right infrahilar mass as well as the axillary mass under the care of Dr. Lisbeth Renshaw. 2) Systemic chemotherapy with carboplatin for AUC of 5, paclitaxel 175 mg/M2 and Keytruda 200 mg IV every 3 weeks status post 4 cycles. 3) Maintenance immunotherapy with single agent Keytruda 200 mg IV every 3 weeks status post 41 cycles.  His treatment is currently on hold secondary to intolerance.  CURRENT THERAPY: Observation.  INTERVAL HISTORY: Travis Padilla 75 y.o. male returns to the clinic today for follow-up visit.  The patient is feeling fine today with no concerning complaints except for mild fatigue.  He also lost few pounds recently.  He is under a lot of stress recently taking care of his wife who has complication from her treatment with chemotherapy for colon cancer.  He denied having any current chest pain, shortness of breath, cough or hemoptysis.  He denied having any fever or chills.  He has no nausea, vomiting, diarrhea or constipation.  He has no headache or visual changes.  He is here today for evaluation with repeat CT scan of the chest, abdomen and pelvis for restaging of his disease.  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) ([place]) dx'd 04/17/18   to LN, infrahilar mass, lung nodule and lt axilla   Obesity, unspecified 03/28/2009   Qualifier: Diagnosis of  By: [name], MD, [name]     Palpitations 03/28/2009   Qualifier: Diagnosis of  By: [name], MD, [name]     Right lower lobe lung mass 04/22/2018   Skin cancer    Sleep apnea    SNORING 03/28/2009   Qualifier: Diagnosis of  By: [name], MD, [name]     Spinal stenosis    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[place]) 05/13/2018    ALLERGIES:  is allergic to iohexol, pravastatin, and iodine.  MEDICATIONS:  Current Outpatient Medications  Medication Sig Dispense Refill   acetaminophen (TYLENOL) 500 MG tablet Take 1,000 mg by mouth every 6 (six) hours as needed.     albuterol (VENTOLIN HFA) 108 (90 Base) MCG/ACT inhaler Inhale 2 puffs into the lungs every 6 (six) hours as needed for wheezing or shortness of breath. 8 g 6   ALPRAZolam (XANAX) 0.25 MG tablet Take 1 tablet (0.25 mg total) by mouth at bedtime as needed for anxiety. 30 tablet 0   Artificial Tear Solution (SOOTHE XP OP) Place 1  drop into both eyes 2 (two) times daily.     carvedilol (COREG) 12.5 MG tablet TAKE 1 TABLET (12.5 MG TOTAL) BY MOUTH IN THE MORNING, AT NOON, AND AT BEDTIME. 270 tablet 3   carvedilol (COREG) 3.125 MG tablet TAKE 1 TABLET BY MOUTH 2 TIMES DAILY. 60 tablet 3   clopidogrel (PLAVIX) 75 MG tablet Take 1 tablet (75 mg total) by mouth daily. 90 tablet 2   [employer] IRON 325 (65 Fe) MG tablet TAKE 1 TABLET BY MOUTH DAILY 90 tablet 1   fluticasone (FLONASE) 50 MCG/ACT nasal spray Place 1 spray into both nostrils daily. (Patient taking differently: Place 1 spray into both nostrils daily as needed for allergies.) 16 g 3    Fluticasone-Umeclidin-Vilant (TRELEGY ELLIPTA) 100-62.5-25 MCG/INH AEPB Inhale 1 puff into the lungs daily. 60 each 6   levothyroxine (SYNTHROID) 175 MCG tablet Take 1 tablet (175 mcg total) by mouth daily. 90 tablet 0   methocarbamol (ROBAXIN) 500 MG tablet Take 500 mg by mouth every 6 (six) hours as needed for muscle spasms.     nitroGLYCERIN (NITROSTAT) 0.4 MG SL tablet Place 1 tablet (0.4 mg total) under the tongue every 5 (five) minutes as needed for chest pain. 25 tablet 2   pantoprazole (PROTONIX) 40 MG tablet Take 1 tablet (40 mg total) by mouth daily before breakfast. 90 tablet 2   polyethylene glycol powder (GLYCOLAX/MIRALAX) 17 GM/SCOOP powder Take 1 Container by mouth daily.     PRALUENT 150 MG/ML SOAJ INJECT 150 MG INTO THE SKIN EVERY 14 (FOURTEEN) DAYS. 6 mL 3   predniSONE (DELTASONE) 50 MG tablet Take one tablet 13 hours, 7 hours and 1 hour before your exam. 3 tablet 1   senna-docusate (SENNA S) 8.6-50 MG tablet 1 to 2 twice daily for constipation (Patient taking differently: Take 1-2 tablets by mouth 2 (two) times daily as needed for mild constipation or moderate constipation.) 120 tablet 5   sulfamethoxazole-trimethoprim (BACTRIM DS) 800-160 MG tablet Take 1 tablet by mouth 2 (two) times daily.     tamsulosin (FLOMAX) 0.4 MG CAPS capsule Take 0.4 mg by mouth at bedtime.     No current facility-administered medications for this visit.    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   BELPHAROPTOSIS REPAIR Bilateral    CARDIAC CATHETERIZATION     CATARACT EXTRACTION W/ INTRAOCULAR LENS  IMPLANT, BILATERAL     COLONOSCOPY N/A 11/03/2014   Procedure: COLONOSCOPY;  Surgeon: Rogene Houston, MD;  [facility]: [facility];  Service: Endoscopy;  Laterality: N/A;  1225   CORONARY ATHERECTOMY N/A 05/26/2021   Procedure: CORONARY ATHERECTOMY;  Surgeon: Early Osmond, MD;  Location: [facility];  Service:  Cardiovascular;  Laterality: N/A;   CORONARY STENT INTERVENTION N/A 05/25/2021   Procedure: CORONARY STENT INTERVENTION;  Surgeon: Nelva Bush, MD;  Location: [facility];  Service: Cardiovascular;  Laterality: N/A;   CORONARY STENT INTERVENTION N/A 05/26/2021   Procedure: CORONARY STENT INTERVENTION;  Surgeon: Early Osmond, MD;  Location: [facility];  Service: Cardiovascular;  Laterality: N/A;   ESOPHAGOGASTRODUODENOSCOPY (EGD) WITH PROPOFOL N/A 05/29/2021   Procedure: ESOPHAGOGASTRODUODENOSCOPY (EGD) WITH PROPOFOL;  Surgeon: Gatha Mayer, MD;  Location: [facility];  Service: Endoscopy;  Laterality: N/A;   INTRAVASCULAR ULTRASOUND/IVUS N/A 05/26/2021   Procedure: Intravascular Ultrasound/IVUS;  Surgeon: Early Osmond, MD;  Location: [facility];  Service: Cardiovascular;  Laterality: N/A;   IR CV LINE INJECTION  08/24/2020   IR FLUORO GUIDED NEEDLE PLC ASPIRATION/INJECTION LOC  11/28/2020   IR IMAGING GUIDED PORT INSERTION  07/11/2018   KNEE CARTILAGE SURGERY Left    Left knee   LEFT HEART CATH AND CORONARY ANGIOGRAPHY N/A 05/25/2021   Procedure: LEFT HEART CATH AND CORONARY ANGIOGRAPHY;  Surgeon: Nelva Bush, MD;  Location: [facility];  Service: Cardiovascular;  Laterality: N/A;   SKIN CANCER EXCISION  12/2014, 04/25/15   TEMPORARY PACEMAKER N/A 05/26/2021   Procedure: TEMPORARY PACEMAKER;  Surgeon: Early Osmond, MD;  Location: [facility];  Service: Cardiovascular;  Laterality: N/A;   VIDEO BRONCHOSCOPY Bilateral 05/09/2018   Procedure: VIDEO BRONCHOSCOPY WITH FLUORO;  Surgeon: Juanito Doom, MD;  Location: [facility];  Service: Cardiopulmonary;  Laterality: Bilateral;    REVIEW OF SYSTEMS:  A comprehensive review of systems was negative except for: Constitutional: positive for fatigue and weight loss   PHYSICAL EXAMINATION: General appearance: alert, cooperative, fatigued, and no distress Head: Normocephalic, without obvious  abnormality, atraumatic Neck: no adenopathy, no JVD, supple, symmetrical, trachea midline, and thyroid not enlarged, symmetric, no tenderness/mass/nodules Lymph nodes: Cervical, supraclavicular, and axillary nodes normal. Resp: clear to auscultation bilaterally Back: symmetric, no curvature. ROM normal. No CVA tenderness. Cardio: regular rate and rhythm, S1, S2 normal, no murmur, click, rub or gallop GI: soft, non-tender; bowel sounds normal; no masses,  no organomegaly Extremities: extremities normal, atraumatic, no cyanosis or edema  ECOG PERFORMANCE STATUS: 1 - Symptomatic but completely ambulatory  Blood pressure (!) 118/96, pulse 61, temperature 97.8 F (36.6 C), temperature source Tympanic, resp. rate 19, height 6\' 2"  (1.88 m), weight 215 lb 1.6 oz (97.6 kg), SpO2 100 %.  LABORATORY DATA: Lab Results  Component Value Date   WBC 4.9 08/18/2021   HGB 13.9 08/18/2021   HCT 42.0 08/18/2021   MCV 94.6 08/18/2021   PLT 186 08/18/2021      Chemistry      Component Value Date/Time   NA 141 08/18/2021 0944   NA 145 (H) 05/24/2021 1152   K 4.4 08/18/2021 0944   CL 106 08/18/2021 0944   CO2 25 08/18/2021 0944   BUN 17 08/18/2021 0944   BUN 23 05/24/2021 1152   CREATININE 1.13 08/18/2021 0944      Component Value Date/Time   CALCIUM 9.3 08/18/2021 0944   ALKPHOS 60 08/18/2021 0944   AST 18 08/18/2021 0944   ALT 13 08/18/2021 0944   BILITOT 0.5 08/18/2021 0944       RADIOGRAPHIC STUDIES: CT Chest W Contrast  Result Date: 08/21/2021 CLINICAL DATA:  Restaging non-small cell lung cancer. EXAM: CT CHEST, ABDOMEN, AND PELVIS WITH CONTRAST TECHNIQUE: Multidetector CT imaging of the chest, abdomen and pelvis was performed following the standard protocol during bolus administration of intravenous contrast. CONTRAST:  44mL OMNIPAQUE IOHEXOL 350 MG/ML SOLN COMPARISON:  None. CT chest, abdomen and pelvis 04/21/2021 and 06/10/2021 FINDINGS: CT CHEST FINDINGS Cardiovascular: Heart size  normal. No pericardial effusion. Aortic atherosclerosis. Coronary artery calcifications. Mediastinum/Nodes: No enlarged mediastinal, hilar, or axillary lymph nodes. Thyroid gland, trachea, and esophagus demonstrate no significant findings. Lungs/Pleura: No pleural effusion, airspace consolidation, or pneumothorax. Paramediastinal radiation change is identified within the right lung. Left upper lobe lung mass measures 3.0 x 3.1 cm, image 71/6. Formally 3.1 x 3.1 cm (when remeasured). No new lung nodules identified Musculoskeletal: Soft tissue nodule within the left axilla measures 1.6 cm, image 5/2. Unchanged compared with the  previous exam. No suspicious bone lesions. CT ABDOMEN PELVIS FINDINGS Hepatobiliary: Cyst within dome of left lobe of liver measures 1.5 cm, image 54/2. No suspicious liver lesions. Gallbladder negative. No bile duct dilatation. Pancreas: Unremarkable. No pancreatic ductal dilatation or surrounding inflammatory changes. Spleen: Normal in size without focal abnormality. Adrenals/Urinary Tract: Normal adrenal glands. Small bilateral nonobstructing renal calculi. Upper pole left kidney cyst measures 1.1 cm. No hydronephrosis. Bladder is unremarkable. Stomach/Bowel: Stomach appears normal. The appendix is visualized and is within normal limits. No bowel wall thickening, inflammation, or distension. Vascular/Lymphatic: Aortic atherosclerosis. No abdominopelvic adenopathy identified. Reproductive: Mild prostate gland enlargement. Other: No free fluid or fluid collections. Musculoskeletal: No acute or suspicious osseous findings. Degenerative disc disease is noted at L5-S1. Marland Kitchen IMPRESSION: 1. Stable size of left upper lobe lung mass. No signs of new or progressive disease. 2. Stable radiation change within the paramediastinal right lung. 3. Stable left axillary soft tissue nodule. 4. Bilateral nonobstructing renal calculi. 5. Aortic Atherosclerosis (ICD10-I70.0). Coronary artery calcifications.  Electronically Signed   By: Kerby Moors M.D.   On: 08/21/2021 09:21   CT Abdomen Pelvis W Contrast  Result Date: 08/21/2021 CLINICAL DATA:  Restaging non-small cell lung cancer. EXAM: CT CHEST, ABDOMEN, AND PELVIS WITH CONTRAST TECHNIQUE: Multidetector CT imaging of the chest, abdomen and pelvis was performed following the standard protocol during bolus administration of intravenous contrast. CONTRAST:  53mL OMNIPAQUE IOHEXOL 350 MG/ML SOLN COMPARISON:  None. CT chest, abdomen and pelvis 04/21/2021 and 06/10/2021 FINDINGS: CT CHEST FINDINGS Cardiovascular: Heart size normal. No pericardial effusion. Aortic atherosclerosis. Coronary artery calcifications. Mediastinum/Nodes: No enlarged mediastinal, hilar, or axillary lymph nodes. Thyroid gland, trachea, and esophagus demonstrate no significant findings. Lungs/Pleura: No pleural effusion, airspace consolidation, or pneumothorax. Paramediastinal radiation change is identified within the right lung. Left upper lobe lung mass measures 3.0 x 3.1 cm, image 71/6. Formally 3.1 x 3.1 cm (when remeasured). No new lung nodules identified Musculoskeletal: Soft tissue nodule within the left axilla measures 1.6 cm, image 5/2. Unchanged compared with the previous exam. No suspicious bone lesions. CT ABDOMEN PELVIS FINDINGS Hepatobiliary: Cyst within dome of left lobe of liver measures 1.5 cm, image 54/2. No suspicious liver lesions. Gallbladder negative. No bile duct dilatation. Pancreas: Unremarkable. No pancreatic ductal dilatation or surrounding inflammatory changes. Spleen: Normal in size without focal abnormality. Adrenals/Urinary Tract: Normal adrenal glands. Small bilateral nonobstructing renal calculi. Upper pole left kidney cyst measures 1.1 cm. No hydronephrosis. Bladder is unremarkable. Stomach/Bowel: Stomach appears normal. The appendix is visualized and is within normal limits. No bowel wall thickening, inflammation, or distension. Vascular/Lymphatic: Aortic  atherosclerosis. No abdominopelvic adenopathy identified. Reproductive: Mild prostate gland enlargement. Other: No free fluid or fluid collections. Musculoskeletal: No acute or suspicious osseous findings. Degenerative disc disease is noted at L5-S1. Marland Kitchen IMPRESSION: 1. Stable size of left upper lobe lung mass. No signs of new or progressive disease. 2. Stable radiation change within the paramediastinal right lung. 3. Stable left axillary soft tissue nodule. 4. Bilateral nonobstructing renal calculi. 5. Aortic Atherosclerosis (ICD10-I70.0). Coronary artery calcifications. Electronically Signed   By: Kerby Moors M.D.   On: 08/21/2021 09:21     ASSESSMENT AND PLAN: This is a very pleasant 75 years old white male with very light most smoking history recently diagnosed with stage IV (T3, N0, M1c) non-small cell lung cancer, squamous cell carcinoma based on the biopsy from the left axillary mass. He status post 4 cycles of induction systemic chemotherapy with carboplatin, paclitaxel and Keytruda with partial response.  The patient is currently on maintenance treatment with single agent Keytruda status post 41 cycles. The patient is currently on observation and he is feeling fine today with no concerning complaints except for mild fatigue and few pounds of weight loss. The patient had repeat CT scan of the chest, abdomen pelvis performed recently.  I personally and independently reviewed the scans and discussed the results with the patient today. His scan showed no concerning findings for disease progression. I recommended for him to continue on observation with repeat CT scan of the chest, abdomen pelvis in 4 months. For the hypothyroidism, his TSH is very low, I will adjust his dose of levothyroxine to 150 mcg p.o. daily and we will monitor his TSH closely on the upcoming blood work. The patient was advised to call immediately if he has any other concerning symptoms in the interval.  The patient voices  understanding of current disease status and treatment options and is in agreement with the current care plan. All questions were answered. The patient knows to call the clinic with any problems, questions or concerns. We can certainly see the patient much sooner if necessary.  Disclaimer: This note was dictated with voice recognition software. Similar sounding words can inadvertently be transcribed and may not be corrected upon review.

## 2021-08-31 ENCOUNTER — Telehealth: Payer: Self-pay | Admitting: Internal Medicine

## 2021-08-31 NOTE — Telephone Encounter (Signed)
Sch per 12/ 12 los, pt aware

## 2021-09-21 ENCOUNTER — Encounter: Payer: Self-pay | Admitting: Adult Health

## 2021-09-21 ENCOUNTER — Ambulatory Visit (INDEPENDENT_AMBULATORY_CARE_PROVIDER_SITE_OTHER): Payer: Medicare Other

## 2021-09-21 ENCOUNTER — Other Ambulatory Visit: Payer: Self-pay

## 2021-09-21 ENCOUNTER — Ambulatory Visit (INDEPENDENT_AMBULATORY_CARE_PROVIDER_SITE_OTHER): Payer: Medicare Other | Admitting: Adult Health

## 2021-09-21 VITALS — BP 100/60 | HR 85 | Temp 98.5°F | Ht 74.0 in | Wt 213.0 lb

## 2021-09-21 DIAGNOSIS — C3491 Malignant neoplasm of unspecified part of right bronchus or lung: Secondary | ICD-10-CM

## 2021-09-21 DIAGNOSIS — M546 Pain in thoracic spine: Secondary | ICD-10-CM

## 2021-09-21 DIAGNOSIS — J449 Chronic obstructive pulmonary disease, unspecified: Secondary | ICD-10-CM

## 2021-09-21 DIAGNOSIS — Z85118 Personal history of other malignant neoplasm of bronchus and lung: Secondary | ICD-10-CM | POA: Diagnosis not present

## 2021-09-21 DIAGNOSIS — R079 Chest pain, unspecified: Secondary | ICD-10-CM | POA: Diagnosis not present

## 2021-09-21 NOTE — Patient Instructions (Addendum)
Chest xray today .  Tylenol As needed  for pain  Alternate ice and heat.  If pain not resolved in 1 week go to Primary MD.   May stop Trelegy inhaler .  Albuterol inhaler As needed   Activity as tolerated.  Follow up in 1 month with PFTs with Dr. Valeta Harms or Marlaina Coburn NP  and As needed   Please contact office for sooner follow up if symptoms do not improve or worsen or seek emergency care

## 2021-09-21 NOTE — Progress Notes (Signed)
@Patient  ID: Travis Padilla, male    DOB: [birth year], 76 y.o.   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  Stable radiation changes in the paramediastinal right lung, stable left axillary soft tissue nodule  Spirometry test at the [facility] clinic in the spring 2019 showed moderate airflow obstruction with a ratio of 68% and FEV1 of 74% predicted Methacholine challenge test in the spring 2019 at [facility] clinic was negative Exhaled nitric oxide testing at [facility] clinic in the spring 2019 was 32 ppm  PFT 05/2018 PFT normal , DLCO 73%   Lung Cancer timeline:  Palliative radiotherapy to the right infrahilar mass as well as the axillary mass under the care of Dr. Lisbeth Renshaw. 2) Systemic chemotherapy with carboplatin for AUC of 5, paclitaxel 175 mg/M2 and Keytruda 200 mg IV every 3 weeks status post 4 cycles. 3) Maintenance immunotherapy with single agent Keytruda 200 mg IV every 3 weeks status post 41 cycles.  His treatment is currently on hold secondary to intolerance.  09/21/2021 Follow up: COPD with emphysema, metastatic lung cancer Patient returns for a 95-month follow-up.  Patient has underlying COPD with emphysema.  Last visit patient was recommended to restart his Trelegy inhaler daily.  He was having more fatigue and shortness of breath with activity. Remains active 3 days a week at gym.  Not sure he needs Trelegy , does not take everyday. Does not notice any change in symptoms when he does not take.  Cardiac stents in  September , feels this has helped his activity tolerance and dyspnea.   Wife passed away last week. Support provided.   Flu shot and covid booster utd.   Complains of left posterior back and shoulder blade pain for 1 month , throbbing pain and numbness, worse if raising left arm. Aggravating, will not go away  No chest pain , syncope, palpitations, known injury, n/v/d cough , or congestion. Occasion takes tylenol .   Patient has a history of metastatic lung cancer.  CT chest August 18, 2021 showed stable size of left upper lobe lung mass.  No signs of new or progressive disease.  Stable radiation changes.  Stable left axillary soft tissue nodule.   Allergies  Allergen Reactions   Iohexol Rash    CT scan contrast caused rash and itching    does fine with premeds.   Pravastatin     Numbness in feet    Iodine Hives and Rash    CT scan contrast caused rash and itching     Immunization History  Administered Date(s) Administered   Influenza Nasal 06/29/2016   Influenza Split 05/14/2012   Influenza, High Dose Seasonal PF 08/03/2014, 05/29/2015, 07/22/2017, 06/11/2018, 05/05/2019, 06/28/2020, 05/16/2021   Influenza-Unspecified 06/29/2016   PFIZER Comirnaty(Gray Top)Covid-19 Tri-Sucrose Vaccine 02/14/2021   PFIZER(Purple Top)SARS-COV-2 Vaccination 10/15/2019, 06/13/2020   Pneumococcal Conjugate-13 03/30/2015   Pneumococcal Polysaccharide-23 07/21/2012   Td 02/23/2015   Zoster, Live 07/21/2012    Past Medical History:  Diagnosis Date   Abnormal nuclear stress test  December, 2013   Anemia    Axillary adenopathy 04/24/2018   CAD (coronary artery disease)    Mild nonobstructive plaque in cath 2013   Chest pain    December, 2013   Cough with hemoptysis 04/22/2018   Dizziness    Dyslipidemia 07/14/2019   Encounter for antineoplastic immunotherapy 05/13/2018   Gout    Hemorrhoid    Hyperlipidemia    Hypertension    Hypothyroidism (acquired) 08/11/2018   Metastasis to lung  ([place]) 05/19/2018   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) ([place]) dx'd 04/17/18   to LN, infrahilar mass, lung nodule and lt axilla   Obesity, unspecified 03/28/2009   Qualifier: Diagnosis of  By: [name], MD, [name]     Palpitations 03/28/2009   Qualifier: Diagnosis of  By: [name], MD, [name]     Right lower lobe lung mass 04/22/2018   Skin cancer    Sleep apnea    SNORING 03/28/2009   Qualifier: Diagnosis of  By: [name], MD, [name]     Spinal stenosis    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[place]) 05/13/2018    Tobacco History: Social History   Tobacco Use  Smoking Status Former   Packs/day: 0.20   Years: 2.00   Pack years: 0.40   Types: Cigarettes   Start date: 10/01/1968   Quit date: 1972   Years since quitting: 51.0  Smokeless Tobacco Never   Counseling given: Not Answered   Outpatient Medications Prior to Visit  Medication Sig Dispense Refill   acetaminophen (TYLENOL) 500 MG tablet Take 1,000 mg by mouth every 6 (six) hours as needed.     albuterol (VENTOLIN HFA) 108 (90 Base) MCG/ACT inhaler Inhale 2 puffs into the lungs every 6 (six) hours as needed for wheezing or shortness of breath. 8 g 6   ALPRAZolam (XANAX) 0.25 MG tablet Take 1 tablet (0.25 mg total) by mouth at bedtime as needed for anxiety. 30 tablet 0   Artificial Tear Solution (SOOTHE XP OP) Place 1 drop into both eyes 2 (two) times daily.     carvedilol (COREG) 12.5 MG tablet TAKE 1 TABLET (12.5 MG TOTAL) BY MOUTH IN THE MORNING, AT NOON, AND AT BEDTIME. 270 tablet 3   carvedilol (COREG) 3.125 MG tablet TAKE 1 TABLET BY MOUTH 2 TIMES DAILY. 60 tablet 3   clopidogrel (PLAVIX) 75 MG tablet Take 1 tablet (75 mg total) by mouth daily. 90 tablet 2   [facility] IRON 325 (65 Fe) MG tablet TAKE 1 TABLET BY MOUTH DAILY 90 tablet 1   fluticasone (FLONASE) 50 MCG/ACT nasal spray Place 1 spray into both nostrils daily. (Patient taking differently: Place 1 spray into both nostrils  daily as needed for allergies.) 16 g 3   Fluticasone-Umeclidin-Vilant (TRELEGY ELLIPTA) 100-62.5-25 MCG/INH AEPB Inhale 1 puff into the lungs daily. 60 each 6   levothyroxine (SYNTHROID) 175 MCG tablet Take 1 tablet (175 mcg total) by mouth daily. 90 tablet 0   methocarbamol (ROBAXIN) 500 MG tablet Take 500 mg by mouth every 6 (six) hours as needed for muscle spasms.     nitroGLYCERIN (NITROSTAT) 0.4 MG SL tablet Place 1 tablet (0.4 mg total) under the tongue every 5 (five) minutes as needed for chest pain. 25 tablet 2   pantoprazole (PROTONIX) 40 MG tablet Take 1 tablet (40 mg total) by mouth daily before breakfast. 90 tablet 2   polyethylene glycol powder (GLYCOLAX/MIRALAX) 17 GM/SCOOP powder Take 1 Container by mouth  daily.     PRALUENT 150 MG/ML SOAJ INJECT 150 MG INTO THE SKIN EVERY 14 (FOURTEEN) DAYS. 6 mL 3   senna-docusate (SENNA S) 8.6-50 MG tablet 1 to 2 twice daily for constipation (Patient taking differently: Take 1-2 tablets by mouth 2 (two) times daily as needed for mild constipation or moderate constipation.) 120 tablet 5   sulfamethoxazole-trimethoprim (BACTRIM DS) 800-160 MG tablet Take 1 tablet by mouth 2 (two) times daily.     tamsulosin (FLOMAX) 0.4 MG CAPS capsule Take 0.4 mg by mouth at bedtime.     predniSONE (DELTASONE) 50 MG tablet Take one tablet 13 hours, 7 hours and 1 hour before your exam. (Patient not taking: Reported on 09/21/2021) 3 tablet 1   No facility-administered medications prior to visit.     Review of Systems:   Constitutional:   No  weight loss, night sweats,  Fevers, chills, fatigue, or  lassitude.  HEENT:   No headaches,  Difficulty swallowing,  Tooth/dental problems, or  Sore throat,                No sneezing, itching, ear ache, nasal congestion, post nasal drip,   CV:  No chest pain,  Orthopnea, PND, swelling in lower extremities, anasarca, dizziness, palpitations, syncope.   GI  No heartburn, indigestion, abdominal pain, nausea, vomiting,  diarrhea, change in bowel habits, loss of appetite, bloody stools.   Resp: No shortness of breath with exertion or at rest.  No excess mucus, no productive cough,  No non-productive cough,  No coughing up of blood.  No change in color of mucus.  No wheezing.  No chest wall deformity  Skin: no rash or lesions.  GU: no dysuria, change in color of urine, no urgency or frequency.  No flank pain, no hematuria   MS:  No joint pain or swelling.  No decreased range of motion.  + back pain.    Physical Exam  BP 100/60 (BP Location: Left Arm, Patient Position: Sitting, Cuff Size: Normal)    Pulse 85    Temp 98.5 F (36.9 C) (Oral)    Ht 6\' 2"  (1.88 m)    Wt 213 lb (96.6 kg)    SpO2 98%    BMI 27.35 kg/m   GEN: A/Ox3; pleasant , NAD, elderly    HEENT:  [place]/AT,  EACs-clear, TMs-wnl, NOSE-clear, THROAT-clear, no lesions, no postnasal drip or exudate noted.   NECK:  Supple w/ fair ROM; no JVD; normal carotid impulses w/o bruits; no thyromegaly or nodules palpated; no lymphadenopathy.    RESP  Clear  P & A; w/o, wheezes/ rales/ or rhonchi. no accessory muscle use, no dullness to percussion  CARD:  RRR, no m/r/g, no peripheral edema, pulses intact, no cyanosis or clubbing.  GI:   Soft & nt; nml bowel sounds; no organomegaly or masses detected.   Musco: Warm bil, no deformities or joint swelling noted.   Neuro: alert, no focal deficits noted.    Skin: Warm, no lesions or rashes    Lab Results:   BMET     ProBNP No results found for: PROBNP  Imaging: No results found.    PFT Results Latest Ref Rng & Units 05/30/2018  FVC-Pre L 4.40  FVC-Predicted Pre % 93  FVC-Post L 4.26  FVC-Predicted Post % 90  Pre FEV1/FVC % % 80  Post FEV1/FCV % % 80  FEV1-Pre L 3.52  FEV1-Predicted Pre % 101  FEV1-Post L 3.40  DLCO uncorrected ml/min/mmHg 25.71  DLCO  UNC% % 73  DLCO corrected ml/min/mmHg 27.59  DLCO COR %Predicted % 78  DLVA Predicted % 86    No results found for:  NITRICOXIDE      Assessment & Plan:   No problem-specific Assessment & Plan notes found for this encounter.     Rexene Edison, NP 09/21/2021

## 2021-09-22 DIAGNOSIS — M549 Dorsalgia, unspecified: Secondary | ICD-10-CM | POA: Insufficient documentation

## 2021-09-22 DIAGNOSIS — J449 Chronic obstructive pulmonary disease, unspecified: Secondary | ICD-10-CM | POA: Insufficient documentation

## 2021-09-22 NOTE — Assessment & Plan Note (Signed)
Continue to follow with oncology.

## 2021-09-22 NOTE — Assessment & Plan Note (Signed)
Mild COPD patient does not notice any significant difference with Trelegy.  For now would hold May use albuterol as needed. Repeat PFTs on return.  If becomes symptomatic off of Trelegy.  Would restart  Plan  Patient Instructions  Chest xray today .  Tylenol As needed  for pain  Alternate ice and heat.  If pain not resolved in 1 week go to Primary MD.   May stop Trelegy inhaler .  Albuterol inhaler As needed   Activity as tolerated.  Follow up in 1 month with PFTs with Dr. Valeta Harms or Tully Burgo NP  and As needed   Please contact office for sooner follow up if symptoms do not improve or worsen or seek emergency care

## 2021-09-22 NOTE — Assessment & Plan Note (Signed)
Left-sided back pain suspect is musculoskeletal in nature as it is positional and point tenderness. Check chest x-ray today.  Supportive care discussed.  If not resolving will need to follow-up with primary care

## 2021-09-27 ENCOUNTER — Other Ambulatory Visit: Payer: Self-pay | Admitting: Orthopedic Surgery

## 2021-09-27 DIAGNOSIS — M259 Joint disorder, unspecified: Secondary | ICD-10-CM

## 2021-09-27 NOTE — Progress Notes (Signed)
Called and spoke with patient, provided results/recommendations per Tammy Parrett NP.  He verbalized understanding.  Nothing further needed.

## 2021-10-04 ENCOUNTER — Telehealth: Payer: Self-pay

## 2021-10-04 ENCOUNTER — Ambulatory Visit (INDEPENDENT_AMBULATORY_CARE_PROVIDER_SITE_OTHER): Payer: Medicare Other

## 2021-10-04 VITALS — Ht 74.0 in | Wt 210.0 lb

## 2021-10-04 DIAGNOSIS — Z0000 Encounter for general adult medical examination without abnormal findings: Secondary | ICD-10-CM | POA: Diagnosis not present

## 2021-10-04 NOTE — Telephone Encounter (Signed)
I am not aware of any in [place].  I think the closest might be in [place]?  That's where the [facility] is.  Also, I'd be glad to refill the med but he will need an appt in office to get due to it being a controlled substance.

## 2021-10-04 NOTE — Telephone Encounter (Signed)
In discussing depression, pt states he has had some increased depression recently due to death of wife in 2021/09/07. Pt asks if he can get a refill on his alprazolam?  Also discussed Grief Share programs in [place] and [place] and patient is agreeable to attend. Pt does ask if there are any in [place]. I advised patient that I did not know of any but I would ask you to see if you knew of any. I will also check with my [facility] colleagues to see if they know of any in [place].

## 2021-10-04 NOTE — Patient Instructions (Signed)
Mr. Travis Padilla , Thank you for taking time to come for your Medicare Wellness Visit. I appreciate your ongoing commitment to your health goals. Please review the following plan we discussed and let me know if I can assist you in the future.   Screening recommendations/referrals: Colonoscopy: Done 11/03/2014 Repeat in 10 years  Recommended yearly ophthalmology/optometry visit for glaucoma screening and checkup Recommended yearly dental visit for hygiene and checkup  Vaccinations: Influenza vaccine: Done 9//02/2021 Repeat annually  Pneumococcal vaccine: Done 07/22/2015 and 03/20/2015. Tdap vaccine: Done 02/23/2015 Repeat in 10 years  Shingles vaccine: Done 07/21/2012. Shingrix discussed. Please contact your pharmacy for coverage information.     Covid-19: Done 11/04/2019, 06/13/2020 and 02/14/2021.  Advanced directives: Copy of ADV in chart.  Conditions/risks identified: KEEP UP THE GOOD WORK!!  Next appointment: Follow up in one year for your annual wellness visit. 2024.  Preventive Care 25 Years and Older, Male  Preventive care refers to lifestyle choices and visits with your health care provider that can promote health and wellness. What does preventive care include? A yearly physical exam. This is also called an annual well check. Dental exams once or twice a year. Routine eye exams. Ask your health care provider how often you should have your eyes checked. Personal lifestyle choices, including: Daily care of your teeth and gums. Regular physical activity. Eating a healthy diet. Avoiding tobacco and drug use. Limiting alcohol use. Practicing safe sex. Taking low doses of aspirin every day. Taking vitamin and mineral supplements as recommended by your health care provider. What happens during an annual well check? The services and screenings done by your health care provider during your annual well check will depend on your age, overall health, lifestyle risk factors, and family  history of disease. Counseling  Your health care provider may ask you questions about your: Alcohol use. Tobacco use. Drug use. Emotional well-being. Home and relationship well-being. Sexual activity. Eating habits. History of falls. Memory and ability to understand (cognition). Work and work Statistician. Screening  You may have the following tests or measurements: Height, weight, and BMI. Blood pressure. Lipid and cholesterol levels. These may be checked every 5 years, or more frequently if you are over 30 years old. Skin check. Lung cancer screening. You may have this screening every year starting at age 68 if you have a 30-pack-year history of smoking and currently smoke or have quit within the past 15 years. Fecal occult blood test (FOBT) of the stool. You may have this test every year starting at age 58. Flexible sigmoidoscopy or colonoscopy. You may have a sigmoidoscopy every 5 years or a colonoscopy every 10 years starting at age 81. Prostate cancer screening. Recommendations will vary depending on your family history and other risks. Hepatitis C blood test. Hepatitis B blood test. Sexually transmitted disease (STD) testing. Diabetes screening. This is done by checking your blood sugar (glucose) after you have not eaten for a while (fasting). You may have this done every 1-3 years. Abdominal aortic aneurysm (AAA) screening. You may need this if you are a current or former smoker. Osteoporosis. You may be screened starting at age 72 if you are at high risk. Talk with your health care provider about your test results, treatment options, and if necessary, the need for more tests. Vaccines  Your health care provider may recommend certain vaccines, such as: Influenza vaccine. This is recommended every year. Tetanus, diphtheria, and acellular pertussis (Tdap, Td) vaccine. You may need a Td booster every 10 years.  Zoster vaccine. You may need this after age 4. Pneumococcal  13-valent conjugate (PCV13) vaccine. One dose is recommended after age 62. Pneumococcal polysaccharide (PPSV23) vaccine. One dose is recommended after age 76. Talk to your health care provider about which screenings and vaccines you need and how often you need them. This information is not intended to replace advice given to you by your health care provider. Make sure you discuss any questions you have with your health care provider. Document Released: 09/23/2015 Document Revised: 05/16/2016 Document Reviewed: 06/28/2015 Elsevier Interactive Patient Education  2017 [place] Prevention in the Home Falls can cause injuries. They can happen to people of all ages. There are many things you can do to make your home safe and to help prevent falls. What can I do on the outside of my home? Regularly fix the edges of walkways and driveways and fix any cracks. Remove anything that might make you trip as you walk through a door, such as a raised step or threshold. Trim any bushes or trees on the path to your home. Use bright outdoor lighting. Clear any walking paths of anything that might make someone trip, such as rocks or tools. Regularly check to see if handrails are loose or broken. Make sure that both sides of any steps have handrails. Any raised decks and porches should have guardrails on the edges. Have any leaves, snow, or ice cleared regularly. Use sand or salt on walking paths during winter. Clean up any spills in your garage right away. This includes oil or grease spills. What can I do in the bathroom? Use night lights. Install grab bars by the toilet and in the tub and shower. Do not use towel bars as grab bars. Use non-skid mats or decals in the tub or shower. If you need to sit down in the shower, use a plastic, non-slip stool. Keep the floor dry. Clean up any water that spills on the floor as soon as it happens. Remove soap buildup in the tub or shower regularly. Attach  bath mats securely with double-sided non-slip rug tape. Do not have throw rugs and other things on the floor that can make you trip. What can I do in the bedroom? Use night lights. Make sure that you have a light by your bed that is easy to reach. Do not use any sheets or blankets that are too big for your bed. They should not hang down onto the floor. Have a firm chair that has side arms. You can use this for support while you get dressed. Do not have throw rugs and other things on the floor that can make you trip. What can I do in the kitchen? Clean up any spills right away. Avoid walking on wet floors. Keep items that you use a lot in easy-to-reach places. If you need to reach something above you, use a strong step stool that has a grab bar. Keep electrical cords out of the way. Do not use floor polish or wax that makes floors slippery. If you must use wax, use non-skid floor wax. Do not have throw rugs and other things on the floor that can make you trip. What can I do with my stairs? Do not leave any items on the stairs. Make sure that there are handrails on both sides of the stairs and use them. Fix handrails that are broken or loose. Make sure that handrails are as long as the stairways. Check any carpeting to make sure that  it is firmly attached to the stairs. Fix any carpet that is loose or worn. Avoid having throw rugs at the top or bottom of the stairs. If you do have throw rugs, attach them to the floor with carpet tape. Make sure that you have a light switch at the top of the stairs and the bottom of the stairs. If you do not have them, ask someone to add them for you. What else can I do to help prevent falls? Wear shoes that: Do not have high heels. Have rubber bottoms. Are comfortable and fit you well. Are closed at the toe. Do not wear sandals. If you use a stepladder: Make sure that it is fully opened. Do not climb a closed stepladder. Make sure that both sides of the  stepladder are locked into place. Ask someone to hold it for you, if possible. Clearly mark and make sure that you can see: Any grab bars or handrails. First and last steps. Where the edge of each step is. Use tools that help you move around (mobility aids) if they are needed. These include: Canes. Walkers. Scooters. Crutches. Turn on the lights when you go into a dark area. Replace any light bulbs as soon as they burn out. Set up your furniture so you have a clear path. Avoid moving your furniture around. If any of your floors are uneven, fix them. If there are any pets around you, be aware of where they are. Review your medicines with your doctor. Some medicines can make you feel dizzy. This can increase your chance of falling. Ask your doctor what other things that you can do to help prevent falls. This information is not intended to replace advice given to you by your health care provider. Make sure you discuss any questions you have with your health care provider. Document Released: 06/23/2009 Document Revised: 02/02/2016 Document Reviewed: 10/01/2014 Elsevier Interactive Patient Education  2017 [employer].

## 2021-10-04 NOTE — Progress Notes (Signed)
Subjective:   Travis Padilla is a 76 y.o. male who presents for Medicare Annual/Subsequent preventive examination. Virtual Visit via Telephone Note  I connected with  Travis Padilla on 10/04/21 at 10:30 AM EST by telephone and verified that I am speaking with the correct person using two identifiers.  Location: Patient: HOME Provider: [facility] Persons participating in the virtual visit: patient/Nurse Health Advisor   I discussed the limitations, risks, security and privacy concerns of performing an evaluation and management service by telephone and the availability of in person appointments. The patient expressed understanding and agreed to proceed.  Interactive audio and video telecommunications were attempted between this nurse and patient, however failed, due to patient having technical difficulties OR patient did not have access to video capability.  We continued and completed visit with audio only.  Some vital signs may be absent or patient reported.   Chriss Driver, LPN  Review of Systems     Cardiac Risk Factors include: advanced age (>31men, >88 women);hypertension;dyslipidemia;male gender;sedentary lifestyle;Other (see comment), Risk factor comments: Lung Cancer, COPD  PHONE VISIT PT AT HOME NURSE AT [facility]    Objective:    Today's Vitals   10/04/21 1025 10/04/21 1027  Weight: 210 lb (95.3 kg)   Height: 6\' 2"  (1.88 m)   PainSc:  3    Body mass index is 26.96 kg/m.  Advanced Directives 10/04/2021 06/11/2021 05/25/2021 05/23/2021 05/22/2021 01/19/2021 11/26/2020  Does Patient Have a Medical Advance Directive? Yes Yes Yes Yes Yes Yes Yes  Type of Paramedic of [place];Living will Living will Living will Living will [place];Living will Healthcare Power of [place]  Does patient want to make changes to medical advance directive? - No - Patient declined No - Patient declined - - No - Patient declined -   Copy of [place] in Chart? Yes - validated most recent copy scanned in chart (See row information) - - - - Yes - validated most recent copy scanned in chart (See row information) -  Would patient like information on creating a medical advance directive? - - - - - - -  Pre-existing out of facility DNR order (yellow form or pink MOST form) - - - - - - -    Current Medications (verified) Outpatient Encounter Medications as of 10/04/2021  Medication Sig   acetaminophen (TYLENOL) 500 MG tablet Take 1,000 mg by mouth every 6 (six) hours as needed.   albuterol (VENTOLIN HFA) 108 (90 Base) MCG/ACT inhaler Inhale 2 puffs into the lungs every 6 (six) hours as needed for wheezing or shortness of breath.   ALPRAZolam (XANAX) 0.25 MG tablet Take 1 tablet (0.25 mg total) by mouth at bedtime as needed for anxiety.   Artificial Tear Solution (SOOTHE XP OP) Place 1 drop into both eyes 2 (two) times daily.   carvedilol (COREG) 12.5 MG tablet TAKE 1 TABLET (12.5 MG TOTAL) BY MOUTH IN THE MORNING, AT NOON, AND AT BEDTIME.   carvedilol (COREG) 3.125 MG tablet TAKE 1 TABLET BY MOUTH 2 TIMES DAILY.   clopidogrel (PLAVIX) 75 MG tablet Take 1 tablet (75 mg total) by mouth daily.   [facility] IRON 325 (65 Fe) MG tablet TAKE 1 TABLET BY MOUTH DAILY   fluticasone (FLONASE) 50 MCG/ACT nasal spray Place 1 spray into both nostrils daily. (Patient taking differently: Place 1 spray into both nostrils daily as needed for allergies.)   Fluticasone-Umeclidin-Vilant (TRELEGY ELLIPTA) 100-62.5-25 MCG/INH  AEPB Inhale 1 puff into the lungs daily.   levothyroxine (SYNTHROID) 175 MCG tablet Take 1 tablet (175 mcg total) by mouth daily.   methocarbamol (ROBAXIN) 500 MG tablet Take 500 mg by mouth every 6 (six) hours as needed for muscle spasms.   nitroGLYCERIN (NITROSTAT) 0.4 MG SL tablet Place 1 tablet (0.4 mg total) under the tongue every 5 (five) minutes as needed for chest pain.   pantoprazole (PROTONIX) 40 MG tablet  Take 1 tablet (40 mg total) by mouth daily before breakfast.   polyethylene glycol powder (GLYCOLAX/MIRALAX) 17 GM/SCOOP powder Take 1 Container by mouth daily.   PRALUENT 150 MG/ML SOAJ INJECT 150 MG INTO THE SKIN EVERY 14 (FOURTEEN) DAYS.   predniSONE (DELTASONE) 50 MG tablet Take one tablet 13 hours, 7 hours and 1 hour before your exam.   senna-docusate (SENNA S) 8.6-50 MG tablet 1 to 2 twice daily for constipation (Patient taking differently: Take 1-2 tablets by mouth 2 (two) times daily as needed for mild constipation or moderate constipation.)   sulfamethoxazole-trimethoprim (BACTRIM DS) 800-160 MG tablet Take 1 tablet by mouth 2 (two) times daily.   tamsulosin (FLOMAX) 0.4 MG CAPS capsule Take 0.4 mg by mouth at bedtime.   No facility-administered encounter medications on file as of 10/04/2021.    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) ([place]) dx'd 04/17/18   to LN, infrahilar mass, lung nodule and lt axilla   Obesity, unspecified 03/28/2009   Qualifier: Diagnosis of  By: [name], MD, [name]     Palpitations 03/28/2009   Qualifier: Diagnosis of  By: [name], MD, [name]     Right lower lobe lung mass 04/22/2018   Skin cancer    Sleep apnea    SNORING 03/28/2009   Qualifier: Diagnosis of  By: [name], MD, [name]     Spinal stenosis    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   Past Surgical History:  Procedure Laterality Date   basal skin cancer N/A 2019   Nose   BELPHAROPTOSIS REPAIR Bilateral    CARDIAC CATHETERIZATION     CATARACT EXTRACTION W/ INTRAOCULAR LENS  IMPLANT, BILATERAL     COLONOSCOPY N/A 11/03/2014   Procedure: COLONOSCOPY;  Surgeon: Rogene Houston, MD;  [facility];  Service: Endoscopy;  Laterality: N/A;  1225   CORONARY ATHERECTOMY N/A 05/26/2021   Procedure: CORONARY ATHERECTOMY;  Surgeon: Early Osmond, MD;  Location: [place] [facility];  Service: Cardiovascular;  Laterality: N/A;   CORONARY STENT INTERVENTION N/A 05/25/2021   Procedure: CORONARY STENT INTERVENTION;  Surgeon: Nelva Bush, MD;  Location: [place] [facility];  Service: Cardiovascular;  Laterality: N/A;   CORONARY STENT INTERVENTION N/A 05/26/2021   Procedure: CORONARY STENT INTERVENTION;  Surgeon: Early Osmond, MD;  Location: [place] [facility];  Service:  Cardiovascular;  Laterality: N/A;   ESOPHAGOGASTRODUODENOSCOPY (EGD) WITH PROPOFOL N/A 05/29/2021   Procedure: ESOPHAGOGASTRODUODENOSCOPY (EGD) WITH PROPOFOL;  Surgeon: Gatha Mayer, MD;  Location: [place];  Service: Endoscopy;  Laterality: N/A;   INTRAVASCULAR ULTRASOUND/IVUS N/A 05/26/2021   Procedure: Intravascular Ultrasound/IVUS;  Surgeon: Early Osmond, MD;  Location: [place] [facility];  Service: Cardiovascular;  Laterality: N/A;   IR CV LINE INJECTION  08/24/2020   IR FLUORO GUIDED NEEDLE PLC ASPIRATION/INJECTION LOC  11/28/2020   IR IMAGING GUIDED PORT INSERTION  07/11/2018   KNEE CARTILAGE SURGERY Left    Left knee   LEFT HEART CATH AND CORONARY ANGIOGRAPHY N/A 05/25/2021   Procedure: LEFT HEART CATH AND CORONARY ANGIOGRAPHY;  Surgeon: Nelva Bush, MD;  Location: [place] [facility];  Service: Cardiovascular;  Laterality: N/A;   SKIN CANCER EXCISION  12/2014, 04/25/15   TEMPORARY PACEMAKER N/A 05/26/2021   Procedure: TEMPORARY PACEMAKER;  Surgeon: Early Osmond, MD;  Location: [place] [facility];  Service: Cardiovascular;  Laterality: N/A;   VIDEO BRONCHOSCOPY Bilateral 05/09/2018   Procedure: VIDEO BRONCHOSCOPY WITH FLUORO;  Surgeon: Juanito Doom, MD;  Location: [facility];  Service: Cardiopulmonary;  Laterality: Bilateral;   Family History  Problem Relation Age of Onset   Heart attack Father    Heart failure Mother    Parkinson's disease Mother    Healthy Sister    Skin cancer Sister    Neuropathy Brother    COPD Brother    Epilepsy Brother    Healthy Sister    Healthy Sister    Colon cancer Neg Hx    Social History   Socioeconomic History   Marital status: Widowed    Spouse name: Not on file   Number of children: 1   Years of education: Not on file   Highest education level: Not on file  Occupational History   Occupation: Government social research officer  Tobacco Use   Smoking status: Former    Packs/day: 0.20    Years: 2.00    Pack years: 0.40    Types: Cigarettes    Start date: 10/01/1968    Quit date: 1972    Years since quitting: 51.1   Smokeless tobacco: Never  Vaping Use   Vaping Use: Never used  Substance and Sexual Activity   Alcohol use: No    Comment: hx of    Drug use: No   Sexual activity: Not Currently  Other Topics Concern   Not on file  Social History Narrative   Unable to ask intimate partner violence questions, wife present.    Pt's wife died in September 14, 2021.   Social Determinants of Health   Financial Resource Strain: Low Risk    Difficulty of Paying Living Expenses: Not hard at all  Food Insecurity: No Food Insecurity   Worried About Charity fundraiser in the Last Year: Never true   [place] in the Last Year: Never true  Transportation Needs: No Transportation Needs   Lack of Transportation (Medical): No   Lack of Transportation (Non-Medical): No  Physical Activity: Sufficiently Active   Days of Exercise per Week: 3 days   Minutes of Exercise per Session: 50 min  Stress: Stress  Concern Present   Feeling of Stress : To some extent  Social Connections: Socially Isolated   Frequency of Communication with Friends and Family: More than three times a week   Frequency of Social Gatherings with Friends and Family: More than three times  a week   Attends Religious Services: Never   Active Member of Clubs or Organizations: No   Attends Archivist Meetings: Never   Marital Status: Widowed    Tobacco Counseling Counseling given: Not Answered   Clinical Intake:  Pre-visit preparation completed: Yes  Pain : 0-10 Pain Score: 3  Pain Type: Chronic pain Pain Location: Hip Pain Descriptors / Indicators: Aching Pain Onset: More than a month ago Pain Frequency: Intermittent     BMI - recorded: 26.96 Nutritional Status: BMI 25 -29 Overweight Nutritional Risks: None Diabetes: No  How often do you need to have someone help you when you read instructions, pamphlets, or other written materials from your doctor or pharmacy?: 1 - Never  Diabetic?NO  Interpreter Needed?: No  Information entered by :: MJ Raisha Brabender, LPN   Activities of Daily Living In your present state of health, do you have any difficulty performing the following activities: 10/04/2021 05/31/2021  Hearing? N -  Vision? N -  Difficulty concentrating or making decisions? Y -  Comment Concentration at times. -  Walking or climbing stairs? N -  Dressing or bathing? N -  Doing errands, shopping? N Y  Conservation officer, nature and eating ? N -  Using the Toilet? N -  In the past six months, have you accidently leaked urine? N -  Do you have problems with loss of bowel control? N -  Managing your Medications? N -  Managing your Finances? N -  Housekeeping or managing your Housekeeping? N -  Some recent data might be hidden    Patient Care Team: [name], DO as PCP - General (Family Medicine) [name], MD as PCP - Cardiology (Cardiology) Griselda Miner, MD as Consulting Physician  (Dermatology) Netta Cedars, MD as Consulting Physician (Orthopedic Surgery) Gwendalyn Ege, OD as Referring Physician (Ophthalmology) Rogene Houston, MD as Consulting Physician (Gastroenterology) [name], MD as Consulting Physician (Cardiology) Ilean China, RN as Case Manager  Indicate any recent Medical Services you may have received from other than [facility] providers in the past year (date may be approximate).     Assessment:   This is a routine wellness examination for Johari.  Hearing/Vision screen Hearing Screening - Comments:: Some hearing issues with background noise. Vision Screening - Comments:: Readers. [facility] in [place]. 2022   Dietary issues and exercise activities discussed: Current Exercise Habits: Structured exercise class, Type of exercise: walking;stretching;Other - see comments (Spin class, yoga class.), Time (Minutes): 60, Frequency (Times/Week): 3, Weekly Exercise (Minutes/Week): 180, Intensity: Moderate, Exercise limited by: cardiac condition(s);respiratory conditions(s)   Goals Addressed             This Visit's Progress    DIET - EAT MORE FRUITS AND VEGETABLES   On track    Exercise 3x per week (30 min per time)   On track    Continue to exercise regularly at least 3 times per week       Depression Screen PHQ 2/9 Scores 10/04/2021 07/24/2021 01/17/2021 12/20/2020 12/08/2020 07/27/2020 07/27/2020  PHQ - 2 Score 4 2 0 0 0 0 0  PHQ- 9 Score 6 5 4  - 4 0 0    Fall Risk Fall Risk  10/04/2021 07/24/2021 01/17/2021 12/20/2020 12/08/2020  Falls in the past year? 0 0 0 0 0  Number falls in past yr: 0 - - - -  Injury with Fall? 0 - - - -  Risk for fall due to : No Fall Risks - - - -  Follow up Falls prevention discussed - - Falls evaluation completed -    FALL RISK PREVENTION PERTAINING TO THE HOME:  Any stairs in or around the home? Yes  If so, are there any without handrails? No  Home free of loose throw rugs in walkways, pet beds,  electrical cords, etc? Yes  Adequate lighting in your home to reduce risk of falls? Yes   ASSISTIVE DEVICES UTILIZED TO PREVENT FALLS:  Life alert? No  Use of a cane, walker or w/c? No  Grab bars in the bathroom? No  Shower chair or bench in shower? No  Elevated toilet seat or a handicapped toilet? Yes   TIMED UP AND GO:  Was the test performed? No .  Phone visit.  Cognitive Function: MMSE - Mini Mental State Exam 04/08/2018 10/04/2016  Orientation to time 5 5  Orientation to Place 5 5  Registration 3 3  Attention/ Calculation 5 5  Recall 3 2  Language- name 2 objects 2 2  Language- repeat 1 1  Language- follow 3 step command 3 3  Language- read & follow direction 1 1  Write a sentence 1 1  Copy design 1 1  Total score 30 29     6CIT Screen 10/04/2021  What Year? 0 points  What month? 0 points  What time? 0 points  Count back from 20 0 points  Months in reverse 2 points  Repeat phrase 0 points  Total Score 2    Immunizations Immunization History  Administered Date(s) Administered   Influenza Nasal 06/29/2016   Influenza Split 05/14/2012   Influenza, High Dose Seasonal PF 08/03/2014, 05/29/2015, 07/22/2017, 06/11/2018, 05/05/2019, 06/28/2020, 05/16/2021   Influenza-Unspecified 06/29/2016   PFIZER Comirnaty(Gray Top)Covid-19 Tri-Sucrose Vaccine 02/14/2021   PFIZER(Purple Top)SARS-COV-2 Vaccination 10/15/2019, 06/13/2020   Pneumococcal Conjugate-13 03/30/2015   Pneumococcal Polysaccharide-23 07/21/2012   Td 02/23/2015   Zoster, Live 07/21/2012    TDAP status: Up to date  Flu Vaccine status: Up to date  Pneumococcal vaccine status: Up to date  Covid-19 vaccine status: Completed vaccines  Qualifies for Shingles Vaccine? Yes   Zostavax completed Yes   Shingrix Completed?: No.    Education has been provided regarding the importance of this vaccine. Patient has been advised to call insurance company to determine out of pocket expense if they have not yet  received this vaccine. Advised may also receive vaccine at local pharmacy or Health Dept. Verbalized acceptance and understanding.  Screening Tests Health Maintenance  Topic Date Due   COVID-19 Vaccine (4 - Booster for Pfizer series) 04/11/2021   Zoster Vaccines- Shingrix (1 of 2) 10/24/2021 (Originally 07/04/1965)   Hepatitis C Screening  01/17/2022 (Originally 07/04/1964)   COLONOSCOPY (Pts 45-80yrs Insurance coverage will need to be confirmed)  11/03/2024   TETANUS/TDAP  02/22/2025   Pneumonia Vaccine 27+ Years old  Completed   INFLUENZA VACCINE  Completed   HPV VACCINES  Aged Out    Health Maintenance  Health Maintenance Due  Topic Date Due   COVID-19 Vaccine (4 - Booster for Luke series) 04/11/2021    Colorectal cancer screening: Type of screening: Colonoscopy. Completed 11/03/2014. Repeat every 10 years  Lung Cancer Screening: (Low Dose CT Chest recommended if Age 58-80 years, 30 pack-year currently smoking OR have quit w/in 15years.) does not qualify.   Lung Cancer Screening Referral: Currently under treatment for lung cancer.   Additional Screening:  Hepatitis C Screening: does qualify; Completed due  Vision Screening: Recommended annual ophthalmology exams for early detection of  glaucoma and other disorders of the eye. Is the patient up to date with their annual eye exam?  Yes  Who is the provider or what is the name of the office in which the patient attends annual eye exams? [facility] in [place] If pt is not established with a provider, would they like to be referred to a provider to establish care? No .   Dental Screening: Recommended annual dental exams for proper oral hygiene  Community Resource Referral / Chronic Care Management: CRR required this visit?  No   CCM required this visit?  No      Plan:     I have personally reviewed and noted the following in the patients chart:   Medical and social history Use of alcohol, tobacco or illicit  drugs  Current medications and supplements including opioid prescriptions. Patient is not currently taking opioid prescriptions. Functional ability and status Nutritional status Physical activity Advanced directives List of other physicians Hospitalizations, surgeries, and ER visits in previous 12 months Vitals Screenings to include cognitive, depression, and falls Referrals and appointments  In addition, I have reviewed and discussed with patient certain preventive protocols, quality metrics, and best practice recommendations. A written personalized care plan for preventive services as well as general preventive health recommendations were provided to patient.     Chriss Driver, LPN   [id number]/[id number]   Nurse Notes: Pt with c/o depression due to recent loss of wife in December of 2022. Pt states he feels like he is working through it okay, discussed grief share and patient is agreeable with checking into the program. Pt is up to date on all health maintenance. Discussed shingrix and how to obtain.

## 2021-10-12 ENCOUNTER — Telehealth: Payer: Self-pay

## 2021-10-12 MED ORDER — PREDNISONE 50 MG PO TABS: ORAL_TABLET | ORAL | 0 refills | Status: DC

## 2021-10-12 MED ORDER — DIPHENHYDRAMINE HCL 50 MG PO TABS: 50.0000 mg | ORAL_TABLET | Freq: Once | ORAL | 0 refills | Status: DC

## 2021-10-12 NOTE — Telephone Encounter (Signed)
Phone call to patient to review instructions for 13 hr prep for CT guided steroid injection w/ contrast on 10/20/21 at 1:00 PM. [facility]. Pt aware and verbalized understanding of instructions. Prescription: Pt to take 50 mg of prednisone on 10/20/21 at 12:00 AM (midnight), 50 mg of prednisone on 10/20/21 at 6:00 AM, and 50 mg of prednisone on 10/20/21 at 12:00 PM (noon). Pt is also to take 50 mg of benadryl on 10/20/21 at 12:00 PM (noon). Please call [phone] with any questions.    Benadryl was also sent in as a prescription, per the pts request.

## 2021-10-13 ENCOUNTER — Telehealth: Payer: Self-pay | Admitting: Family Medicine

## 2021-10-13 DIAGNOSIS — F4321 Adjustment disorder with depressed mood: Secondary | ICD-10-CM

## 2021-10-13 NOTE — Telephone Encounter (Signed)
Pt called stating that he recently had a televisit for his AWV with Karle Starch and says one of the questions asked was if he needed grief counseling.  Pt says he would like to see if a grief counselor would help him due to him losing his wife.  Please advise and call patient.

## 2021-10-13 NOTE — Telephone Encounter (Signed)
[place] patient

## 2021-10-13 NOTE — Telephone Encounter (Signed)
At this point, the patient is more interested in individual group counseling and possibly would be ready for group counseling later.

## 2021-10-13 NOTE — Telephone Encounter (Signed)
I certainly think grief counseling may be of some benefit. I know a lot of churches offer grief counseling and they sometimes do it as a group so you are with people who have been through a similar experience.

## 2021-10-15 NOTE — Telephone Encounter (Signed)
I have placed a referral

## 2021-10-16 NOTE — Telephone Encounter (Signed)
Pt aware.

## 2021-10-19 DIAGNOSIS — H43813 Vitreous degeneration, bilateral: Secondary | ICD-10-CM | POA: Diagnosis not present

## 2021-10-20 ENCOUNTER — Other Ambulatory Visit: Payer: Self-pay | Admitting: Family Medicine

## 2021-10-20 ENCOUNTER — Ambulatory Visit
Admission: RE | Admit: 2021-10-20 | Discharge: 2021-10-20 | Disposition: A | Discharge status: Home / Self Care | Payer: Medicare Other | Source: Ambulatory Visit | Attending: Orthopedic Surgery | Admitting: Orthopedic Surgery

## 2021-10-20 DIAGNOSIS — E039 Hypothyroidism, unspecified: Secondary | ICD-10-CM

## 2021-10-20 DIAGNOSIS — M259 Joint disorder, unspecified: Secondary | ICD-10-CM

## 2021-10-20 DIAGNOSIS — M533 Sacrococcygeal disorders, not elsewhere classified: Secondary | ICD-10-CM | POA: Diagnosis not present

## 2021-11-01 ENCOUNTER — Other Ambulatory Visit: Payer: Self-pay | Admitting: Pulmonary Disease

## 2021-11-01 ENCOUNTER — Encounter: Payer: Self-pay | Admitting: Family Medicine

## 2021-11-01 ENCOUNTER — Ambulatory Visit (INDEPENDENT_AMBULATORY_CARE_PROVIDER_SITE_OTHER): Payer: Medicare Other | Admitting: Family Medicine

## 2021-11-01 ENCOUNTER — Inpatient Hospital Stay (INDEPENDENT_AMBULATORY_CARE_PROVIDER_SITE_OTHER): Payer: Medicare Other

## 2021-11-01 VITALS — BP 118/70 | HR 77 | Temp 98.0°F | Ht 74.0 in | Wt 214.6 lb

## 2021-11-01 DIAGNOSIS — Z79899 Other long term (current) drug therapy: Secondary | ICD-10-CM

## 2021-11-01 DIAGNOSIS — F4321 Adjustment disorder with depressed mood: Secondary | ICD-10-CM

## 2021-11-01 DIAGNOSIS — L570 Actinic keratosis: Secondary | ICD-10-CM | POA: Diagnosis not present

## 2021-11-01 DIAGNOSIS — F418 Other specified anxiety disorders: Secondary | ICD-10-CM | POA: Diagnosis not present

## 2021-11-01 DIAGNOSIS — R002 Palpitations: Secondary | ICD-10-CM

## 2021-11-01 DIAGNOSIS — L821 Other seborrheic keratosis: Secondary | ICD-10-CM | POA: Diagnosis not present

## 2021-11-01 DIAGNOSIS — J449 Chronic obstructive pulmonary disease, unspecified: Secondary | ICD-10-CM

## 2021-11-01 DIAGNOSIS — Z23 Encounter for immunization: Secondary | ICD-10-CM

## 2021-11-01 DIAGNOSIS — L578 Other skin changes due to chronic exposure to nonionizing radiation: Secondary | ICD-10-CM | POA: Diagnosis not present

## 2021-11-01 MED ORDER — ALPRAZOLAM 0.25 MG PO TABS: 0.2500 mg | ORAL_TABLET | Freq: Every evening | ORAL | 0 refills | Status: DC | PRN

## 2021-11-01 NOTE — Progress Notes (Signed)
Subjective: CC: Follow-up grief PCP: [name], DO OMV:[facility] Travis Padilla is a 76 y.o. male presenting to clinic today for:  1.  Grief, anxiety Patient notes that he continues to have "dark butterflies" in his stomach.  He denies any abnormal bowel movements, including constipation or diarrhea.  No blood in stool.  No nausea, vomiting.  No abdominal pain per se.  He admits that he continues to struggle quite a bit with the loss of his wife.  He is trying to celebrate her life and they will be having an open house memorial service for her soon.  He plans on traveling the country and even going up into [place] to spread her ashes at all her favorite places.  He feels well supported by his son and family members.  He did try and seek counseling recently but did not connect with 1 person and the other did not really accept his insurance.  He has been trying to utilize virtual self-help courses but really appreciate someone to talk to.   ROS: Per HPI  Allergies  Allergen Reactions   Iohexol Rash    CT scan contrast caused rash and itching    does fine with premeds.   Pravastatin     Numbness in feet    Iodine Hives and Rash    CT scan contrast caused rash and itching    Past Medical History:  Diagnosis Date   Abnormal nuclear stress test    December, 2013   Anemia    Axillary adenopathy 04/24/2018   CAD (coronary artery disease)    Mild nonobstructive plaque in cath 2013   Chest pain    December, 2013   Cough with hemoptysis 04/22/2018   Dizziness    Dyslipidemia 07/14/2019   Encounter for antineoplastic immunotherapy 05/13/2018   Gout    Hemorrhoid    Hyperlipidemia    Hypertension    Hypothyroidism (acquired) 08/11/2018   Metastasis to lung ([place]) 05/19/2018   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) ([place]) dx'd 04/17/18   to LN, infrahilar mass, lung nodule and lt axilla   Obesity, unspecified 03/28/2009   Qualifier: Diagnosis of  By: [name], MD,  [name]     Palpitations 03/28/2009   Qualifier: Diagnosis of  By: [name], MD, [name]     Right lower lobe lung mass 04/22/2018   Skin cancer    Sleep apnea    SNORING 03/28/2009   Qualifier: Diagnosis of  By: [name], MD, [name]     Spinal stenosis    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    Current Outpatient Medications:    acetaminophen (TYLENOL) 500 MG tablet, Take 1,000 mg by mouth every 6 (six) hours as needed., Disp: , Rfl:    albuterol (VENTOLIN HFA) 108 (90 Base) MCG/ACT inhaler, Inhale 2 puffs into the lungs every 6 (six) hours as needed for wheezing or shortness of breath., Disp: 8 g, Rfl: 6   ALPRAZolam (XANAX) 0.25 MG tablet, Take 1 tablet (0.25 mg total) by mouth at bedtime as needed for anxiety., Disp: 30 tablet, Rfl: 0   Artificial Tear Solution (SOOTHE XP OP), Place 1 drop into both eyes 2 (two) times daily., Disp: , Rfl:    carvedilol (COREG) 12.5 MG tablet, TAKE 1 TABLET (12.5 MG TOTAL) BY MOUTH IN THE MORNING, AT NOON, AND AT BEDTIME., Disp: 270 tablet, Rfl: 3   carvedilol (COREG) 3.125 MG tablet, TAKE 1 TABLET BY  MOUTH 2 TIMES DAILY., Disp: 60 tablet, Rfl: 3   clopidogrel (PLAVIX) 75 MG tablet, Take 1 tablet (75 mg total) by mouth daily., Disp: 90 tablet, Rfl: 2   [employer] IRON 325 (65 Fe) MG tablet, TAKE 1 TABLET BY MOUTH DAILY, Disp: 90 tablet, Rfl: 1   fluticasone (FLONASE) 50 MCG/ACT nasal spray, Place 1 spray into both nostrils daily. (Patient taking differently: Place 1 spray into both nostrils daily as needed for allergies.), Disp: 16 g, Rfl: 3   Fluticasone-Umeclidin-Vilant (TRELEGY ELLIPTA) 100-62.5-25 MCG/INH AEPB, Inhale 1 puff into the lungs daily., Disp: 60 each, Rfl: 6   levothyroxine (SYNTHROID) 175 MCG tablet, TAKE 1 TABLET BY MOUTH EVERY DAY, Disp: 90 tablet, Rfl: 0   methocarbamol (ROBAXIN) 500 MG tablet, Take 500 mg by mouth every 6 (six) hours as needed for muscle spasms., Disp: , Rfl:    nitroGLYCERIN  (NITROSTAT) 0.4 MG SL tablet, Place 1 tablet (0.4 mg total) under the tongue every 5 (five) minutes as needed for chest pain., Disp: 25 tablet, Rfl: 2   pantoprazole (PROTONIX) 40 MG tablet, Take 1 tablet (40 mg total) by mouth daily before breakfast., Disp: 90 tablet, Rfl: 2   polyethylene glycol powder (GLYCOLAX/MIRALAX) 17 GM/SCOOP powder, Take 1 Container by mouth daily., Disp: , Rfl:    PRALUENT 150 MG/ML SOAJ, INJECT 150 MG INTO THE SKIN EVERY 14 (FOURTEEN) DAYS., Disp: 6 mL, Rfl: 3   senna-docusate (SENNA S) 8.6-50 MG tablet, 1 to 2 twice daily for constipation (Patient taking differently: Take 1-2 tablets by mouth 2 (two) times daily as needed for mild constipation or moderate constipation.), Disp: 120 tablet, Rfl: 5   tamsulosin (FLOMAX) 0.4 MG CAPS capsule, Take 0.4 mg by mouth at bedtime., Disp: , Rfl:    diphenhydrAMINE (BENADRYL) 50 MG tablet, Take 1 tablet (50 mg total) by mouth once for 1 dose. Pt to take 50 mg of benadryl on 10/20/21 at 12:00 PM (noon). Please call [phone] with any questions., Disp: 1 tablet, Rfl: 0   predniSONE (DELTASONE) 50 MG tablet, Take one tablet 13 hours, 7 hours and 1 hour before your exam. (Patient not taking: Reported on 11/01/2021), Disp: 3 tablet, Rfl: 1   predniSONE (DELTASONE) 50 MG tablet, Pt to take 50 mg of prednisone on 10/20/21 at 12:00 AM (midnight), 50 mg of prednisone on 10/20/21 at 6:00 AM, and 50 mg of prednisone on 10/20/21 at 12:00 PM (noon). Pt is also to take 50 mg of benadryl on 10/20/21 at 12:00 PM (noon). Please call [phone] with any questions. (Patient not taking: Reported on 11/01/2021), Disp: 3 tablet, Rfl: 0 Social History   Socioeconomic History   Marital status: Widowed    Spouse name: Not on file   Number of children: 1   Years of education: Not on file   Highest education level: Not on file  Occupational History   Occupation: Government social research officer  Tobacco Use   Smoking status: Former    Packs/day: 0.20    Years: 2.00     Pack years: 0.40    Types: Cigarettes    Start date: 10/01/1968    Quit date: 1972    Years since quitting: 51.1   Smokeless tobacco: Never  Vaping Use   Vaping Use: Never used  Substance and Sexual Activity   Alcohol use: No    Comment: hx of    Drug use: No   Sexual activity: Not Currently  Other Topics Concern   Not on file  Social History  Narrative   Unable to ask intimate partner violence questions, wife present.    Pt's wife died in 02-Sep-2021.   Social Determinants of Health   Financial Resource Strain: Low Risk    Difficulty of Paying Living Expenses: Not hard at all  Food Insecurity: No Food Insecurity   Worried About Charity fundraiser in the Last Year: Never true   [place] in the Last Year: Never true  Transportation Needs: No Transportation Needs   Lack of Transportation (Medical): No   Lack of Transportation (Non-Medical): No  Physical Activity: Sufficiently Active   Days of Exercise per Week: 3 days   Minutes of Exercise per Session: 50 min  Stress: Stress Concern Present   Feeling of Stress : To some extent  Social Connections: Socially Isolated   Frequency of Communication with Friends and Family: More than three times a week   Frequency of Social Gatherings with Friends and Family: More than three times a week   Attends Religious Services: Never   Marine scientist or Organizations: No   Attends Archivist Meetings: Never   Marital Status: Widowed  Human resources officer Violence: Not At Risk   Fear of Current or Ex-Partner: No   Emotionally Abused: No   Physically Abused: No   Sexually Abused: No   Family History  Problem Relation Age of Onset   Heart attack Father    Heart failure Mother    Parkinson's disease Mother    Healthy Sister    Skin cancer Sister    Neuropathy Brother    COPD Brother    Epilepsy Brother    Healthy Sister    Healthy Sister    Colon cancer Neg Hx     Objective: Office vital signs  reviewed. BP 118/70    Pulse 77    Temp 98 F (36.7 C)    Ht 6\' 2"  (1.88 m)    Wt 214 lb 9.6 oz (97.3 kg)    SpO2 98%    BMI 27.55 kg/m   Physical Examination:  General: Awake, alert, well nourished, No acute distress Psych: Tearful but stoic.  Patient very pleasant and interactive.  Thought process linear.  Depression screen [facility] 2/9 11/01/2021 10/04/2021 07/24/2021  Decreased Interest 0 2 1  Down, Depressed, Hopeless 2 2 1   PHQ - 2 Score 2 4 2   Altered sleeping 2 1 1   Tired, decreased energy 0 1 1  Change in appetite 0 0 0  Feeling bad or failure about yourself  1 0 1  Trouble concentrating 1 0 0  Moving slowly or fidgety/restless 0 0 0  Suicidal thoughts 0 0 0  PHQ-9 Score 6 6 5   Difficult doing work/chores Somewhat difficult Somewhat difficult Somewhat difficult  Some recent data might be hidden   GAD 7 : Generalized Anxiety Score 11/01/2021 07/24/2021 01/17/2021 12/08/2020  Nervous, Anxious, on Edge 2 1 1  0  Control/stop worrying 1 1 0 0  Worry too much - different things 1 1 0 0  Trouble relaxing 1 1 0 0  Restless 1 1 1  0  Easily annoyed or irritable 0 1 1 0  Afraid - awful might happen 0 1 - 0  Total GAD 7 Score 6 7 - 0  Anxiety Difficulty Somewhat difficult Somewhat difficult Not difficult at all Not difficult at all      Assessment/ Plan: 76 y.o. male   Situational anxiety - Plan: ALPRAZolam (XANAX) 0.25 MG tablet, AMB  Referral to [facility]ordin[facility]  Grief reaction - Plan: AMB Referral to Community Care Coordinaton  Controlled substance agreement signed - Plan: ToxASSURE Select 13 (MW), Urine  Heart palpitations - Plan: LONG TERM MONITOR (3-14 DAYS)  I have placed an urgent referral to CCM for [place] to do some grief counseling with this patient.  I also advised him to consider checking out hospice care grief counseling services.  I have given him another temporary supply of the alprazolam to have on hand.  We discussed that if he does not find the symptom  relief that he is looking for with the counseling services and is requiring the alprazolam frequently we should consider a daily treatment with like an SSRI or similar.  We will see each other back soon for checkup.  The national narcotic database reviewed and there were no red flags.  UDS and CSC ordered.  For his heart palpitations, Zio patch was placed on him.  He will mail this back in about 2 weeks and I will review.  May need to consider reevaluation with Dr. Azzie Roup if we find anything abnormal.  However, I do wonder if some of this may be precipitated by anxiety and stress surrounding to the death of his wife  Orders Placed This Encounter  Procedures   Varicella-zoster vaccine IM (Shingrix)   No orders of the defined types were placed in this encounter.    [name], DO [place] [phone]

## 2021-11-02 ENCOUNTER — Ambulatory Visit (INDEPENDENT_AMBULATORY_CARE_PROVIDER_SITE_OTHER): Payer: Medicare Other | Admitting: Adult Health

## 2021-11-02 ENCOUNTER — Encounter: Payer: Self-pay | Admitting: Adult Health

## 2021-11-02 ENCOUNTER — Ambulatory Visit (INDEPENDENT_AMBULATORY_CARE_PROVIDER_SITE_OTHER): Payer: Medicare Other | Admitting: Pulmonary Disease

## 2021-11-02 ENCOUNTER — Other Ambulatory Visit: Payer: Self-pay

## 2021-11-02 DIAGNOSIS — C3491 Malignant neoplasm of unspecified part of right bronchus or lung: Secondary | ICD-10-CM | POA: Diagnosis not present

## 2021-11-02 DIAGNOSIS — J449 Chronic obstructive pulmonary disease, unspecified: Secondary | ICD-10-CM

## 2021-11-02 LAB — PULMONARY FUNCTION TEST
DL/VA % pred: 91 %
DL/VA: 3.61 ml/min/mmHg/L
DLCO cor % pred: 83 %
DLCO cor: 22.37 ml/min/mmHg
DLCO unc % pred: 83 %
DLCO unc: 22.37 ml/min/mmHg
FEF 25-75 Post: 2.74 L/sec
FEF 25-75 Pre: 2.65 L/sec
FEF2575-%Change-Post: 3 %
FEF2575-%Pred-Post: 111 %
FEF2575-%Pred-Pre: 107 %
FEV1-%Change-Post: 0 %
FEV1-%Pred-Post: 103 %
FEV1-%Pred-Pre: 102 %
FEV1-Post: 3.47 L
FEV1-Pre: 3.45 L
FEV1FVC-%Change-Post: 1 %
FEV1FVC-%Pred-Pre: 103 %
FEV6-%Change-Post: 0 %
FEV6-%Pred-Post: 103 %
FEV6-%Pred-Pre: 104 %
FEV6-Post: 4.51 L
FEV6-Pre: 4.53 L
FEV6FVC-%Change-Post: 0 %
FEV6FVC-%Pred-Post: 105 %
FEV6FVC-%Pred-Pre: 105 %
FVC-%Change-Post: 0 %
FVC-%Pred-Post: 98 %
FVC-%Pred-Pre: 98 %
FVC-Post: 4.53 L
FVC-Pre: 4.56 L
Post FEV1/FVC ratio: 77 %
Post FEV6/FVC ratio: 100 %
Pre FEV1/FVC ratio: 76 %
Pre FEV6/FVC Ratio: 99 %
RV % pred: 90 %
RV: 2.38 L
TLC % pred: 92 %
TLC: 6.87 L

## 2021-11-02 NOTE — Assessment & Plan Note (Signed)
Lung cancer.  Continue to follow-up with oncology and serial scans as planned

## 2021-11-02 NOTE — Progress Notes (Signed)
PFT done today. 

## 2021-11-02 NOTE — Progress Notes (Signed)
@Patient  ID: [name], male    DOB: Dec 02, 1945, 76 y.o.   MRN: [id number]  Chief Complaint  Patient presents with   Follow-up    Referring provider: [name], DO  HPI: 76 year old male former smoker (minimal smoking hx -second hand smoke exposure) with a known history of metastatic lung cancer, stage IV squamous cell carcinoma (received 4 cycles of chemotherapy followed by Beryle Flock) and XRT  COPD. 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  Stable radiation changes in the paramediastinal right lung, stable left axillary soft tissue nodule   Spirometry test at the [facility] clinic in the spring 2019 showed moderate airflow obstruction with a ratio of 68% and FEV1 of 74% predicted Methacholine challenge test in the spring 2019 at [facility] clinic was negative Exhaled nitric oxide testing at [facility] clinic in the spring 2019 was 32 ppm   PFT 05/2018 PFT normal , DLCO 73%    Lung Cancer timeline:  Palliative radiotherapy to the right infrahilar mass as well as the axillary mass under the care of Dr. Lisbeth Renshaw. 2) Systemic chemotherapy with carboplatin for AUC of 5, paclitaxel 175 mg/M2 and Keytruda 200 mg IV every 3 weeks status post 4 cycles. 3) Maintenance immunotherapy with single agent Keytruda 200 mg IV every 3 weeks status post 41 cycles.  His treatment is currently on hold secondary to intolerance.  11/02/2021 Follow up : COPD with emphysema, metastatic lung cancer Patient returns for 6-week follow-up.  Patient has underlying COPD with emphysema.   Says he tries to stay active goes to the gym about 3 days a week.  Patient has not noticed a whole lot of difference since starting Trelegy.  Trelegy was stopped last visit.  Patient has not seen a flare of symptoms off of maintenance inhaler.  No increased albuterol  use.  He was set up for PFTs that showed was normal lung function with no significant airflow obstruction or restriction.  Normal diffusing capacity with an FEV1 at 103%, ratio 77, FVC 98%, no significant bronchodilator response, DLCO 83%. Stable lung function with improved diffusing capacity.  His wife recently passed away.  Has had some increased stress with this.  Has a history of metastatic lung cancer.  CT chest August 18, 2021 showed stable size of left upper lobe lung mass.  No signs of new or progressive disease.  Patient is followed by oncology.  Has a follow-up CT planned in April  Does yoga on regular basis..  Allergies  Allergen Reactions   Iohexol Rash    CT scan contrast caused rash and itching    does fine with premeds.   Pravastatin     Numbness in feet    Iodine Hives and Rash    CT scan contrast caused rash and itching     Immunization History  Administered Date(s) Administered   Influenza Nasal 06/29/2016   Influenza Split 05/14/2012   Influenza, High Dose Seasonal PF 08/03/2014, 05/29/2015, 07/22/2017, 06/11/2018, 05/05/2019, 06/28/2020, 05/16/2021   Influenza-Unspecified 06/29/2016   PFIZER Comirnaty(Gray Top)Covid-19 Tri-Sucrose Vaccine 02/14/2021   PFIZER(Purple Top)SARS-COV-2 Vaccination 10/15/2019, 06/13/2020   Pneumococcal Conjugate-13 03/30/2015   Pneumococcal Polysaccharide-23 07/21/2012   Td 02/23/2015   Zoster Recombinat (Shingrix) 11/01/2021   Zoster, Live 07/21/2012    Past Medical History:  Diagnosis Date   Abnormal nuclear stress test  December, 2013   Anemia    Axillary adenopathy 04/24/2018   CAD (coronary artery disease)    Mild nonobstructive plaque in cath 2013   Chest pain    December, 2013   Cough with hemoptysis 04/22/2018   Dizziness    Dyslipidemia 07/14/2019   Encounter for antineoplastic immunotherapy 05/13/2018   Gout    Hemorrhoid    Hyperlipidemia    Hypertension    Hypothyroidism (acquired) 08/11/2018    Metastasis to lung ([place]) 05/19/2018   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) ([place]) dx'd 04/17/18   to LN, infrahilar mass, lung nodule and lt axilla   Obesity, unspecified 03/28/2009   Qualifier: Diagnosis of  By: [name], MD, [name]     Palpitations 03/28/2009   Qualifier: Diagnosis of  By: [name], MD, [name]     Right lower lobe lung mass 04/22/2018   Skin cancer    Sleep apnea    SNORING 03/28/2009   Qualifier: Diagnosis of  By: [name], MD, [name]     Spinal stenosis    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[place]) 05/13/2018    Tobacco History: Social History   Tobacco Use  Smoking Status Former   Packs/day: 0.20   Years: 2.00   Pack years: 0.40   Types: Cigarettes   Start date: 10/01/1968   Quit date: 1972   Years since quitting: 51.1  Smokeless Tobacco Never   Counseling given: Not Answered   Outpatient Medications Prior to Visit  Medication Sig Dispense Refill   acetaminophen (TYLENOL) 500 MG tablet Take 1,000 mg by mouth every 6 (six) hours as needed.     albuterol (VENTOLIN HFA) 108 (90 Base) MCG/ACT inhaler Inhale 2 puffs into the lungs every 6 (six) hours as needed for wheezing or shortness of breath. 8 g 6   ALPRAZolam (XANAX) 0.25 MG tablet Take 1 tablet (0.25 mg total) by mouth at bedtime as needed for anxiety. 30 tablet 0   Artificial Tear Solution (SOOTHE XP OP) Place 1 drop into both eyes 2 (two) times daily.     carvedilol (COREG) 12.5 MG tablet TAKE 1 TABLET (12.5 MG TOTAL) BY MOUTH IN THE MORNING, AT NOON, AND AT BEDTIME. 270 tablet 3   carvedilol (COREG) 3.125 MG tablet TAKE 1 TABLET BY MOUTH 2 TIMES DAILY. 60 tablet 3   clopidogrel (PLAVIX) 75 MG tablet Take 1 tablet (75 mg total) by mouth daily. 90 tablet 2   CVS IRON 325 (65 Fe) MG tablet TAKE 1 TABLET BY MOUTH DAILY 90 tablet 1   fluticasone (FLONASE) 50 MCG/ACT nasal spray Place 1 spray into both nostrils daily. (Patient taking differently: Place 1 spray  into both nostrils daily as needed for allergies.) 16 g 3   Fluticasone-Umeclidin-Vilant (TRELEGY ELLIPTA) 100-62.5-25 MCG/INH AEPB Inhale 1 puff into the lungs daily. 60 each 6   levothyroxine (SYNTHROID) 175 MCG tablet TAKE 1 TABLET BY MOUTH EVERY DAY 90 tablet 0   methocarbamol (ROBAXIN) 500 MG tablet Take 500 mg by mouth every 6 (six) hours as needed for muscle spasms.     nitroGLYCERIN (NITROSTAT) 0.4 MG SL tablet Place 1 tablet (0.4 mg total) under the tongue every 5 (five) minutes as needed for chest pain. 25 tablet 2   pantoprazole (PROTONIX) 40 MG tablet Take 1 tablet (40 mg total) by mouth daily before breakfast. 90 tablet 2   polyethylene glycol powder (GLYCOLAX/MIRALAX) 17 GM/SCOOP powder Take 1 Container by mouth daily.  PRALUENT 150 MG/ML SOAJ INJECT 150 MG INTO THE SKIN EVERY 14 (FOURTEEN) DAYS. 6 mL 3   predniSONE (DELTASONE) 50 MG tablet Take one tablet 13 hours, 7 hours and 1 hour before your exam. 3 tablet 1   predniSONE (DELTASONE) 50 MG tablet Pt to take 50 mg of prednisone on 10/20/21 at 12:00 AM (midnight), 50 mg of prednisone on 10/20/21 at 6:00 AM, and 50 mg of prednisone on 10/20/21 at 12:00 PM (noon). Pt is also to take 50 mg of benadryl on 10/20/21 at 12:00 PM (noon). Please call [phone] with any questions. 3 tablet 0   senna-docusate (SENNA S) 8.6-50 MG tablet 1 to 2 twice daily for constipation (Patient taking differently: Take 1-2 tablets by mouth 2 (two) times daily as needed for mild constipation or moderate constipation.) 120 tablet 5   tamsulosin (FLOMAX) 0.4 MG CAPS capsule Take 0.4 mg by mouth at bedtime.     diphenhydrAMINE (BENADRYL) 50 MG tablet Take 1 tablet (50 mg total) by mouth once for 1 dose. Pt to take 50 mg of benadryl on 10/20/21 at 12:00 PM (noon). Please call [phone] with any questions. 1 tablet 0   No facility-administered medications prior to visit.     Review of Systems:   Constitutional:   No  weight loss, night sweats,  Fevers,  chills, fatigue, or  lassitude.  HEENT:   No headaches,  Difficulty swallowing,  Tooth/dental problems, or  Sore throat,                No sneezing, itching, ear ache, nasal congestion, post nasal drip,   CV:  No chest pain,  Orthopnea, PND, swelling in lower extremities, anasarca, dizziness, palpitations, syncope.   GI  No heartburn, indigestion, abdominal pain, nausea, vomiting, diarrhea, change in bowel habits, loss of appetite, bloody stools.   Resp: .  No chest wall deformity  Skin: no rash or lesions.  GU: no dysuria, change in color of urine, no urgency or frequency.  No flank pain, no hematuria   MS:  No joint pain or swelling.  No decreased range of motion.  No back pain.    Physical Exam  BP 126/68 (BP Location: Left Arm, Patient Position: Sitting, Cuff Size: Normal)    Pulse 71    Temp 98.5 F (36.9 C) (Oral)    Ht 6\' 2"  (1.88 m)    Wt 213 lb (96.6 kg)    SpO2 98%    BMI 27.35 kg/m   GEN: A/Ox3; pleasant , NAD, well nourished    HEENT:  [place]/AT,   NOSE-clear, THROAT-clear, no lesions, no postnasal drip or exudate noted.   NECK:  Supple w/ fair ROM; no JVD; normal carotid impulses w/o bruits; no thyromegaly or nodules palpated; no lymphadenopathy.    RESP  Clear  P & A; w/o, wheezes/ rales/ or rhonchi. no accessory muscle use, no dullness to percussion  CARD:  RRR, no m/r/g, no peripheral edema, pulses intact, no cyanosis or clubbing.  GI:   Soft & nt; nml bowel sounds; no organomegaly or masses detected.   Musco: Warm bil, no deformities or joint swelling noted.   Neuro: alert, no focal deficits noted.    Skin: Warm, no lesions or rashes    Lab Results:  CBC  No results found for: PROBNP  Imaging:     PFT Results Latest Ref Rng & Units 11/02/2021 05/30/2018  FVC-Pre L 4.56 4.40  FVC-Predicted Pre % 98 93  FVC-Post L 4.53 4.26  FVC-Predicted Post % 98 90  Pre FEV1/FVC % % 76 80  Post FEV1/FCV % % 77 80  FEV1-Pre L 3.45 3.52  FEV1-Predicted Pre %  102 101  FEV1-Post L 3.47 3.40  DLCO uncorrected ml/min/mmHg 22.37 25.71  DLCO UNC% % 83 73  DLCO corrected ml/min/mmHg 22.37 27.59  DLCO COR %Predicted % 83 78  DLVA Predicted % 91 86  TLC L 6.87 -  TLC % Predicted % 92 -  RV % Predicted % 90 -    No results found for: NITRICOXIDE      Assessment & Plan:   COPD (chronic obstructive pulmonary disease) (HCC) Currently stable COPD.  PFTs today were reviewed in detail with patient.  No decline in lung function.  Patient's had no flare in symptoms off of Trelegy.  Patient is continue activity as tolerated.  Albuterol as needed. Vaccines with flu, pneumonia and COVID-19 are up-to-date.  Plan  Patient Instructions  Albuterol inhaler As needed   Activity as tolerated.  Follow up in 1 year with Dr. Valeta Harms or Bethany Cumming NP  and As needed   Please contact office for sooner follow up if symptoms do not improve or worsen or seek emergency care        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[place]) Lung cancer.  Continue to follow-up with oncology and serial scans as planned     Rexene Edison, NP 11/02/2021

## 2021-11-02 NOTE — Assessment & Plan Note (Signed)
Currently stable COPD.  PFTs today were reviewed in detail with patient.  No decline in lung function.  Patient's had no flare in symptoms off of Trelegy.  Patient is continue activity as tolerated.  Albuterol as needed. Vaccines with flu, pneumonia and COVID-19 are up-to-date.  Plan  Patient Instructions  Albuterol inhaler As needed   Activity as tolerated.  Follow up in 1 year with Dr. Valeta Harms or Suhaan Perleberg NP  and As needed   Please contact office for sooner follow up if symptoms do not improve or worsen or seek emergency care

## 2021-11-02 NOTE — Patient Instructions (Addendum)
Albuterol inhaler As needed   Activity as tolerated.  Follow up in 1 year with Dr. Valeta Harms or Leya Paige NP  and As needed   Please contact office for sooner follow up if symptoms do not improve or worsen or seek emergency care

## 2021-11-07 ENCOUNTER — Telehealth: Payer: Self-pay

## 2021-11-07 NOTE — Chronic Care Management (AMB) (Signed)
Chronic Care Management   Note  11/07/2021 Name: Travis Padilla MRN: [id number] DOB: 03/07/1946  Travis Padilla is a 76 y.o. year old male who is a primary care patient of [name], DO. I reached out to [name] by phone today in response to a referral sent by [name] PCP.  [name] was given information about Chronic Care Management services today including:  CCM service includes personalized support from designated clinical staff supervised by his physician, including individualized plan of care and coordination with other care providers 24/7 contact phone numbers for assistance for urgent and routine care needs. Service will only be billed when office clinical staff spend 20 minutes or more in a month to coordinate care. Only one practitioner may furnish and bill the service in a calendar month. The patient may stop CCM services at any time (effective at the end of the month) by phone call to the office staff. The patient is responsible for co-pay (up to 20% after annual deductible is met) if co-pay is required by the individual health plan.   Patient agreed to services and verbal consent obtained.   Follow up plan: Telephone appointment with care management team member scheduled for:11/08/2021  Noreene Larsson, [place], [place],  [address] Direct Dial: [phone] [email or web address]

## 2021-11-08 ENCOUNTER — Ambulatory Visit (INDEPENDENT_AMBULATORY_CARE_PROVIDER_SITE_OTHER): Payer: Medicare Other | Admitting: Licensed Clinical Social Worker

## 2021-11-08 DIAGNOSIS — D509 Iron deficiency anemia, unspecified: Secondary | ICD-10-CM

## 2021-11-08 DIAGNOSIS — F4321 Adjustment disorder with depressed mood: Secondary | ICD-10-CM

## 2021-11-08 DIAGNOSIS — G479 Sleep disorder, unspecified: Secondary | ICD-10-CM

## 2021-11-08 DIAGNOSIS — I10 Essential (primary) hypertension: Secondary | ICD-10-CM

## 2021-11-08 DIAGNOSIS — E039 Hypothyroidism, unspecified: Secondary | ICD-10-CM

## 2021-11-08 NOTE — Chronic Care Management (AMB) (Signed)
Chronic Care Management    Clinical Social Work Note  11/08/2021 Name: DRAGON THRUSH MRN: [id number] DOB: Apr 06, 1946  Travis Padilla is a 76 y.o. year old male who is a primary care patient of [name], DO. The CCM team was consulted to assist the patient with chronic disease management and/or care coordination needs related to: [employer] .   Engaged with patient by telephone for initial visit in response to provider referral for social work chronic care management and care coordination services.   Consent to Services:  The patient was given the following information about Chronic Care Management services today, agreed to services, and gave verbal consent: 1. CCM service includes personalized support from designated clinical staff supervised by the primary care provider, including individualized plan of care and coordination with other care providers 2. 24/7 contact phone numbers for assistance for urgent and routine care needs. 3. Service will only be billed when office clinical staff spend 20 minutes or more in a month to coordinate care. 4. Only one practitioner may furnish and bill the service in a calendar month. 5.The patient may stop CCM services at any time (effective at the end of the month) by phone call to the office staff. 6. The patient will be responsible for cost sharing (co-pay) of up to 20% of the service fee (after annual deductible is met). Patient agreed to services and consent obtained.  Patient agreed to services and consent obtained.   Assessment: Review of patient past medical history, allergies, medications, and health status, including review of relevant consultants reports was performed today as part of a comprehensive evaluation and provision of chronic care management and care coordination services.     SDOH (Social Determinants of Health) assessments and interventions performed:  SDOH Interventions    Flowsheet Row Most Recent Value  SDOH  Interventions   Stress Interventions Provide Counseling  [Travis Padilla has stress related to passing of his wife recently.,]  Depression Interventions/Treatment  Counseling        Advanced Directives Status: See Vynca application for related entries.  CCM Care Plan  Allergies  Allergen Reactions   Iohexol Rash    CT scan contrast caused rash and itching    does fine with premeds.   Pravastatin     Numbness in feet    Iodine Hives and Rash    CT scan contrast caused rash and itching     Outpatient Encounter Medications as of 11/08/2021  Medication Sig   acetaminophen (TYLENOL) 500 MG tablet Take 1,000 mg by mouth every 6 (six) hours as needed.   albuterol (VENTOLIN HFA) 108 (90 Base) MCG/ACT inhaler Inhale 2 puffs into the lungs every 6 (six) hours as needed for wheezing or shortness of breath.   ALPRAZolam (XANAX) 0.25 MG tablet Take 1 tablet (0.25 mg total) by mouth at bedtime as needed for anxiety.   Artificial Tear Solution (SOOTHE XP OP) Place 1 drop into both eyes 2 (two) times daily.   carvedilol (COREG) 12.5 MG tablet TAKE 1 TABLET (12.5 MG TOTAL) BY MOUTH IN THE MORNING, AT NOON, AND AT BEDTIME.   carvedilol (COREG) 3.125 MG tablet TAKE 1 TABLET BY MOUTH 2 TIMES DAILY.   clopidogrel (PLAVIX) 75 MG tablet Take 1 tablet (75 mg total) by mouth daily.   [employer] IRON 325 (65 Fe) MG tablet TAKE 1 TABLET BY MOUTH DAILY   diphenhydrAMINE (BENADRYL) 50 MG tablet Take 1 tablet (50 mg total) by mouth once for 1 dose. Pt  to take 50 mg of benadryl on 10/20/21 at 12:00 PM (noon). Please call [phone] with any questions.   fluticasone (FLONASE) 50 MCG/ACT nasal spray Place 1 spray into both nostrils daily. (Patient taking differently: Place 1 spray into both nostrils daily as needed for allergies.)   Fluticasone-Umeclidin-Vilant (TRELEGY ELLIPTA) 100-62.5-25 MCG/INH AEPB Inhale 1 puff into the lungs daily.   levothyroxine (SYNTHROID) 175 MCG tablet TAKE 1 TABLET BY MOUTH EVERY DAY   methocarbamol  (ROBAXIN) 500 MG tablet Take 500 mg by mouth every 6 (six) hours as needed for muscle spasms.   nitroGLYCERIN (NITROSTAT) 0.4 MG SL tablet Place 1 tablet (0.4 mg total) under the tongue every 5 (five) minutes as needed for chest pain.   pantoprazole (PROTONIX) 40 MG tablet Take 1 tablet (40 mg total) by mouth daily before breakfast.   polyethylene glycol powder (GLYCOLAX/MIRALAX) 17 GM/SCOOP powder Take 1 Container by mouth daily.   PRALUENT 150 MG/ML SOAJ INJECT 150 MG INTO THE SKIN EVERY 14 (FOURTEEN) DAYS.   predniSONE (DELTASONE) 50 MG tablet Take one tablet 13 hours, 7 hours and 1 hour before your exam.   predniSONE (DELTASONE) 50 MG tablet Pt to take 50 mg of prednisone on 10/20/21 at 12:00 AM (midnight), 50 mg of prednisone on 10/20/21 at 6:00 AM, and 50 mg of prednisone on 10/20/21 at 12:00 PM (noon). Pt is also to take 50 mg of benadryl on 10/20/21 at 12:00 PM (noon). Please call [phone] with any questions.   senna-docusate (SENNA S) 8.6-50 MG tablet 1 to 2 twice daily for constipation (Patient taking differently: Take 1-2 tablets by mouth 2 (two) times daily as needed for mild constipation or moderate constipation.)   tamsulosin (FLOMAX) 0.4 MG CAPS capsule Take 0.4 mg by mouth at bedtime.   No facility-administered encounter medications on file as of 11/08/2021.    Patient Active Problem List   Diagnosis Date Noted   COPD (chronic obstructive pulmonary disease) ([place]) 09/22/2021   Back pain 09/22/2021   Fever 06/29/2021   Acute blood loss anemia    Gastrointestinal hemorrhage associated with acute gastritis    Unstable angina ([place]) 05/25/2021   Coronary artery disease involving native coronary artery of native heart without angina pectoris 12/07/2020   SIRS (systemic inflammatory response syndrome) ([place]) 11/27/2020   Encephalitis    Thrombocytopenia ([facility])    Severe sepsis without septic shock ([place]) 11/26/2020   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   Neuropathy due to drug  ([place]) 04/14/2020   Itching 11/17/2019   Dyslipidemia 07/14/2019   Shoulder pain 03/31/2019   Port-A-Cath in place 08/11/2018   Hypothyroidism (acquired) 08/11/2018   Metastasis to lung ([place]) 05/19/2018   Encounter for antineoplastic chemotherapy 05/13/2018   Encounter for antineoplastic immunotherapy 05/13/2018   Goals of care, counseling/discussion 05/13/2018   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[place]) 05/13/2018   Axillary adenopathy 04/24/2018   Right lower lobe lung mass 04/22/2018   Cough with hemoptysis 04/22/2018   Wheezing 04/02/2018   Long-term use of high-risk medication 04/02/2018   Dizziness 06/26/2017   Fatigue 04/30/2016   Insomnia 04/30/2016   Sinusitis 01/31/2016   Hyperlipidemia 01/31/2016   Ear pain 05/09/2015   Nephrolithiasis 04/18/2015   Anemia    Abnormal nuclear stress test    OBESITY, UNSPECIFIED 03/28/2009   Essential hypertension, benign 03/28/2009   PALPITATIONS 03/28/2009   SNORING 03/28/2009    Conditions to be addressed/monitored: monitor client management of depression issues and grief issues  Care Plan : LCSW Care  Plan  Updates made by Travis Cabal, LCSW since 11/08/2021 12:00 AM     Problem: Depression Identification (Depression)      Goal: Depressive Symptoms Identified.  Manage Grief issues faced   Start Date: 11/08/2021  Expected End Date: 01/30/2022  This Visit's Progress: On track  Priority: Medium  Note:   CARE PLAN ENTRY  Current Barriers:   Grief Issues related to recent death of his wife Stress issues Sleeping issues  Clinical Social Work Goal(s):   Patient will talk with SW in next 30 days about grief management and about depression management Patient will attend scheduled medical appointments in next 30 days Patient will call RNCM as needed in next 30 days to discuss nursing needs of client  Interventions:  Assessments completed:  PHQ 2/9 ; GAD-7 1:1 collaboration with Dr. Adam Phenix, DO  regarding development and update of comprehensive plan of care as evidenced by provider attestation and co-signature. Reviewed client needs with Travis Padilla Reviewed grief issues experienced by client.  Spouse of client died recently. LCSW talked with Travis Padilla about Grief Share progtram through local Hospice. LCSW talked with Travis Padilla about Hospice Chaplain support. LCSW talked with Travis Padilla about web site related to mental health needs:  [email or web address] Reviewed transport issues of client  Reviewed appetite of client. Reviewed sleeping issues of clinet. Travis Padilla said he has some difficulty sleeping Discussed relaxation techniques. He goes to gym to work out , He particiaptes in Yoga exercises. He enjoys cooking as a way to relax. Discussed socializing of client. He talks regularly with his son who lives in [place], [place]. He has neighbors and friends he enjoys taking with . He enjoys going out for meals with friends and enjoys socializing during meals Discussed pain issues. He said he has periodic back pain issues Reviewed medication procurement of client Client and LCSW spoke of fact that he will be on a Zoom call today with Hospice to learn more about Hospice resources either in person or via Patent attorney client on his accessing support for grief management Provided counseling support for client  Patient Coping Skills Attends medical appointments Socializes with family and friends Exercises regularly  Patient Deficits  Depression issues Grief issues  Patient Goals:   Attend medical appointments in next 30 days Socialize regularly with friends and family in next 30 days Learn more about grief support options in the area  Follow Up Call. LCSW to call client on 12/06/21 at 10:00 AM to assess client needs   Travis Padilla MSW, [place] Holiday representative [facility] Care Management [phone]

## 2021-11-08 NOTE — Patient Instructions (Addendum)
Visit Information ? ?Patient Goals:  Depressive Symptoms Identified. Manage Depression issues. Manage Grief issues ? ?Time Frame:  Short Term Goal ?Priority:  Medium ?Progress: On Track ? ?Start Date: 11/08/21 ?End Date:  01/30/22 ? ?Follow Up Date:  12/06/21 at 10:00 AM ? ?Depressive Symptoms Identified. Manage Depression issues. Manage Grief issues ? ?Patient Coping Skills ?Attends medical appointments ?Socializes with family and friends ?Exercises regularly ? ?Patient Deficits ? ?Depression issues ?Grief issues ? ?Patient Goals:   ?Attend medical appointments in next 30 days ?Socialize regularly with friends and family in next 20 days ?Learn more about grief support options in the area ? ?Follow Up Call. LCSW to call client on 12/06/21 at 10:00 AM to assess client needs ? ?Norva Riffle.Nathon Stefanski MSW, LCSW ?Licensed Clinical Social Worker ?[facility] ?[phone] ?

## 2021-11-18 DIAGNOSIS — R002 Palpitations: Secondary | ICD-10-CM | POA: Diagnosis not present

## 2021-11-29 ENCOUNTER — Encounter: Payer: Self-pay | Admitting: Cardiology

## 2021-11-29 NOTE — Telephone Encounter (Signed)
Spoke to patient about email.Stated his PCP ordered a heart monitor 11/21/21.Stated he was told to see Dr.Hocrhrein.Monitor can be viewed in his chart.Patient requested a sooner appointment with Dr.Hochrein to discuss.Appointment scheduled 3/30 at 9:30 am.Advised I will make Dr.Hochrein aware. ?

## 2021-11-29 NOTE — Progress Notes (Signed)
?  ?Cardiology Office Note ? ? ?Date:  11/30/2021  ? ?ID:  Travis Padilla, DOB 1946/01/23, MRN [id number] ? ?PCP:  [name], DO  ?Cardiologist:   [name], MD ? ? ?Chief Complaint  ?Patient presents with  ? Palpitations  ? ? ? ?  ?History of Present Illness: ?Travis Padilla is a 76 y.o. male who presents for follow-up of coronary artery disease.Outpatient cardiac catheterization was scheduled.he had chest discomfort in 2022.  He underwent LHC 05/25/2021 which showed 80% mid LAD (chronic total occlusion) and sequential 70-95% proximal mid RCA lesion.  He received DES x1 to his LAD.  He had staged PCI 05/26/2021 with successful coronary arthrectomy and stenting of high-grade calcified lesions of proximal-mid RCA.  After the procedure he was noted to have anemia due to GI bleed.  His hemoglobin went from 12 down to 7.9 on 9/17.  He was noted to be FOBT positive.  His dual antiplatelet therapy was briefly held in setting of concern for cardiogenic shock.  He was bridged with cangrelor.  He received 3 units of PRBCs.  He was evaluated by GI and underwent EGD which showed erosive gastropathy with no active bleeding.  It was felt that it was his prior source of melena.  His H. pylori was negative.  He received IV iron prior to discharge.  Supplemental iron was added to his medication regimen.  There were recommendations to continue on antiplatelet therapy.  He was continued on Plavix without aspirin.  Recommendation for Protonix 40 mg daily was also made.  ? ?He recently had a a monitor by his primary provider recently because of palpitations and had NSR with some runs of NSVT.  He said he has been having these palpitations but has been under stress because his wife died after 3 months in the hospital with lymphoma.  He said he just had an uneasiness in his chest.  The monitor demonstrated 4 episodes of nonsustained ventricular tachycardia with the longest being 4 beats.  There were occasional runs of  supraventricular tachycardia with the longest lasting 10 beats.  He does not really feel the palpitations per se.  He feels just a vague sense in his chest.  He does not think that happening every day.  He has not had any presyncope or syncope.  He does related to emotional stress.  Does not have any chest pressure, neck or arm discomfort.  He has not required any nitroglycerin. ? ? ?Past Medical History:  ?Diagnosis Date  ? Abnormal nuclear stress test   ? December, 2013  ? Anemia   ? Axillary adenopathy 04/24/2018  ? CAD (coronary artery disease)   ? Mild nonobstructive plaque in cath 2013  ? Chest pain   ? December, 2013  ? Cough with hemoptysis 04/22/2018  ? Dizziness   ? Dyslipidemia 07/14/2019  ? Encounter for antineoplastic immunotherapy 05/13/2018  ? Gout   ? Hemorrhoid   ? Hyperlipidemia   ? Hypertension   ? Hypothyroidism (acquired) 08/11/2018  ? Metastasis to lung ([place]) 05/19/2018  ? 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) [facility]) dx'd 04/17/18  ? to LN, infrahilar mass, lung nodule and lt axilla  ? Obesity, unspecified 03/28/2009  ? Qualifier: Diagnosis of  By: [name], MD, [name]    ? Palpitations 03/28/2009  ? Qualifier: Diagnosis of  By: [name], MD, [name]    ? Right lower lobe lung mass 04/22/2018  ? Skin cancer   ? Sleep  apnea   ? SNORING 03/28/2009  ? Qualifier: Diagnosis of  By: [name], MD, [name]    ? Spinal stenosis   ? 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[place]) 05/13/2018  ? ? ?Past Surgical History:  ?Procedure Laterality Date  ? basal skin cancer N/A 2019  ? Nose  ? BELPHAROPTOSIS REPAIR Bilateral   ? CARDIAC CATHETERIZATION    ? CATARACT EXTRACTION W/ INTRAOCULAR LENS  IMPLANT, BILATERAL    ? COLONOSCOPY N/A 11/03/2014  ? Procedure: COLONOSCOPY;  Surgeon: Rogene Houston, MD;  [facility];  Service: Endoscopy;  Laterality: N/A;  1225  ? CORONARY ATHERECTOMY N/A 05/26/2021  ? Procedure: CORONARY ATHERECTOMY;  Surgeon: Early Osmond,  MD;  Location: [facility] CV LAB;  Service: Cardiovascular;  Laterality: N/A;  ? CORONARY STENT INTERVENTION N/A 05/25/2021  ? Procedure: CORONARY STENT INTERVENTION;  Surgeon: Nelva Bush, MD;  Location: [facility] CV LAB;  Service: Cardiovascular;  Laterality: N/A;  ? CORONARY STENT INTERVENTION N/A 05/26/2021  ? Procedure: CORONARY STENT INTERVENTION;  Surgeon: Early Osmond, MD;  Location: [place] [facility] LAB;  Service: Cardiovascular;  Laterality: N/A;  ? ESOPHAGOGASTRODUODENOSCOPY (EGD) WITH PROPOFOL N/A 05/29/2021  ? Procedure: ESOPHAGOGASTRODUODENOSCOPY (EGD) WITH PROPOFOL;  Surgeon: Gatha Mayer, MD;  Location: [facility];  Service: Endoscopy;  Laterality: N/A;  ? INTRAVASCULAR ULTRASOUND/IVUS N/A 05/26/2021  ? Procedure: Intravascular Ultrasound/IVUS;  Surgeon: Early Osmond, MD;  Location: [facility] CV LAB;  Service: Cardiovascular;  Laterality: N/A;  ? IR CV LINE INJECTION  08/24/2020  ? IR FLUORO GUIDED NEEDLE PLC ASPIRATION/INJECTION LOC  11/28/2020  ? IR IMAGING GUIDED PORT INSERTION  07/11/2018  ? KNEE CARTILAGE SURGERY Left   ? Left knee  ? LEFT HEART CATH AND CORONARY ANGIOGRAPHY N/A 05/25/2021  ? Procedure: LEFT HEART CATH AND CORONARY ANGIOGRAPHY;  Surgeon: Nelva Bush, MD;  Location: [place] CV LAB;  Service: Cardiovascular;  Laterality: N/A;  ? SKIN CANCER EXCISION  12/2014, 04/25/15  ? TEMPORARY PACEMAKER N/A 05/26/2021  ? Procedure: TEMPORARY PACEMAKER;  Surgeon: Early Osmond, MD;  Location: [place] [facility] LAB;  Service: Cardiovascular;  Laterality: N/A;  ? VIDEO BRONCHOSCOPY Bilateral 05/09/2018  ? Procedure: VIDEO BRONCHOSCOPY WITH FLUORO;  Surgeon: Juanito Doom, MD;  Location: [facility];  Service: Cardiopulmonary;  Laterality: Bilateral;  ? ? ? ?Current Outpatient Medications  ?Medication Sig Dispense Refill  ? acetaminophen (TYLENOL) 500 MG tablet Take 1,000 mg by mouth every 6 (six) hours as needed.    ? ALPRAZolam (XANAX) 0.25 MG tablet Take 1 tablet  (0.25 mg total) by mouth at bedtime as needed for anxiety. 30 tablet 0  ? Artificial Tear Solution (SOOTHE XP OP) Place 1 drop into both eyes 2 (two) times daily.    ? carvedilol (COREG) 12.5 MG tablet TAKE 1 TABLET (12.5 MG TOTAL) BY MOUTH IN THE MORNING, AT NOON, AND AT BEDTIME. 270 tablet 3  ? carvedilol (COREG) 3.125 MG tablet TAKE 1 TABLET BY MOUTH 2 TIMES DAILY. 60 tablet 3  ? clopidogrel (PLAVIX) 75 MG tablet Take 1 tablet (75 mg total) by mouth daily. 90 tablet 2  ? CVS IRON 325 (65 Fe) MG tablet TAKE 1 TABLET BY MOUTH DAILY 90 tablet 1  ? diphenhydrAMINE (BENADRYL) 50 MG tablet Take 1 tablet (50 mg total) by mouth once for 1 dose. Pt to take 50 mg of benadryl on 10/20/21 at 12:00 PM (noon). Please call [phone] with any questions. 1 tablet 0  ?  fluticasone (FLONASE) 50 MCG/ACT nasal spray Place 1 spray into both nostrils daily. (Patient taking differently: Place 1 spray into both nostrils daily as needed for allergies.) 16 g 3  ? levothyroxine (SYNTHROID) 175 MCG tablet TAKE 1 TABLET BY MOUTH EVERY DAY 90 tablet 0  ? methocarbamol (ROBAXIN) 500 MG tablet Take 500 mg by mouth every 6 (six) hours as needed for muscle spasms.    ? pantoprazole (PROTONIX) 40 MG tablet Take 1 tablet (40 mg total) by mouth daily before breakfast. 90 tablet 2  ? polyethylene glycol powder (GLYCOLAX/MIRALAX) 17 GM/SCOOP powder Take 1 Container by mouth daily.    ? PRALUENT 150 MG/ML SOAJ INJECT 150 MG INTO THE SKIN EVERY 14 (FOURTEEN) DAYS. 6 mL 3  ? predniSONE (DELTASONE) 50 MG tablet Take one tablet 13 hours, 7 hours and 1 hour before your exam. 3 tablet 1  ? predniSONE (DELTASONE) 50 MG tablet Pt to take 50 mg of prednisone on 10/20/21 at 12:00 AM (midnight), 50 mg of prednisone on 10/20/21 at 6:00 AM, and 50 mg of prednisone on 10/20/21 at 12:00 PM (noon). Pt is also to take 50 mg of benadryl on 10/20/21 at 12:00 PM (noon). Please call [phone] with any questions. 3 tablet 0  ? senna-docusate (SENNA S) 8.6-50 MG tablet 1 to  2 twice daily for constipation (Patient taking differently: Take 1-2 tablets by mouth 2 (two) times daily as needed for mild constipation or moderate constipation.) 120 tablet 5  ? tamsulosin (FLOMAX) 0.4 MG CAP

## 2021-11-30 ENCOUNTER — Other Ambulatory Visit: Payer: Self-pay

## 2021-11-30 ENCOUNTER — Ambulatory Visit (INDEPENDENT_AMBULATORY_CARE_PROVIDER_SITE_OTHER): Payer: Medicare Other | Admitting: Cardiology

## 2021-11-30 ENCOUNTER — Encounter: Payer: Self-pay | Admitting: Cardiology

## 2021-11-30 VITALS — BP 118/66 | HR 80 | Ht 74.0 in | Wt 220.2 lb

## 2021-11-30 DIAGNOSIS — D649 Anemia, unspecified: Secondary | ICD-10-CM | POA: Diagnosis not present

## 2021-11-30 DIAGNOSIS — R002 Palpitations: Secondary | ICD-10-CM

## 2021-11-30 DIAGNOSIS — I2510 Atherosclerotic heart disease of native coronary artery without angina pectoris: Secondary | ICD-10-CM

## 2021-11-30 DIAGNOSIS — E785 Hyperlipidemia, unspecified: Secondary | ICD-10-CM | POA: Diagnosis not present

## 2021-11-30 MED ORDER — NITROGLYCERIN 0.4 MG SL SUBL: 0.4000 mg | SUBLINGUAL_TABLET | SUBLINGUAL | 2 refills | Status: AC | PRN

## 2021-11-30 NOTE — Patient Instructions (Signed)
Medication Instructions:  ?Your physician recommends that you continue on your current medications as directed. Please refer to the Current Medication list given to you today. ? ?*If you need a refill on your cardiac medications before your next appointment, please call your pharmacy* ? ?Follow-Up: ?At [facility], you and your health needs are our priority.  As part of our continuing mission to provide you with exceptional heart care, we have created designated Provider Care Teams.  These Care Teams include your primary Cardiologist (physician) and Advanced Practice Providers (APPs -  Physician Assistants and Nurse Practitioners) who all work together to provide you with the care you need, when you need it. ? ?We recommend signing up for the patient portal called "MyChart".  Sign up information is provided on this After Visit Summary.  MyChart is used to connect with patients for Virtual Visits (Telemedicine).  Patients are able to view lab/test results, encounter notes, upcoming appointments, etc.  Non-urgent messages can be sent to your provider as well.   ?To learn more about what you can do with MyChart, go to [email or web address].   ? ?Your next appointment:   ?6 month(s) ? ?The format for your next appointment:   ?In Person ? ?Provider:   ?[name], MD { ? ? ?

## 2021-12-06 ENCOUNTER — Ambulatory Visit: Payer: Medicare Other | Admitting: Licensed Clinical Social Worker

## 2021-12-06 DIAGNOSIS — E039 Hypothyroidism, unspecified: Secondary | ICD-10-CM

## 2021-12-06 DIAGNOSIS — D509 Iron deficiency anemia, unspecified: Secondary | ICD-10-CM

## 2021-12-06 DIAGNOSIS — F5101 Primary insomnia: Secondary | ICD-10-CM

## 2021-12-06 DIAGNOSIS — I10 Essential (primary) hypertension: Secondary | ICD-10-CM

## 2021-12-06 DIAGNOSIS — F4321 Adjustment disorder with depressed mood: Secondary | ICD-10-CM

## 2021-12-06 NOTE — Chronic Care Management (AMB) (Signed)
?Chronic Care Management  ? ? Clinical Social Work Note ? ?12/06/2021 ?Name: Travis Padilla MRN: [id number] DOB: 02-01-1946 ? ?FERLANDO LIA is a 76 y.o. year old male who is a primary care patient of [name], DO. The CCM team was consulted to assist the patient with chronic disease management and/or care coordination needs related to: [employer] .  ? ?Engaged with patient by telephone for follow up visit in response to provider referral for social work chronic care management and care coordination services.  ? ?Consent to Services:  ?The patient was given information about Chronic Care Management services, agreed to services, and gave verbal consent prior to initiation of services.  Please see initial visit note for detailed documentation.  ? ?Patient agreed to services and consent obtained.  ? ?Assessment: Review of patient past medical history, allergies, medications, and health status, including review of relevant consultants reports was performed today as part of a comprehensive evaluation and provision of chronic care management and care coordination services.    ? ?SDOH (Social Determinants of Health) assessments and interventions performed:  ?SDOH Interventions   ? ?Flowsheet Row Most Recent Value  ?SDOH Interventions   ?Stress Interventions Provide Counseling  [client has stress related to managing grief issues faced]  ?Depression Interventions/Treatment  Medication, Counseling  ? ?  ?  ? ?Advanced Directives Status: See Vynca application for related entries. ? ?CCM Care Plan ? ?Allergies  ?Allergen Reactions  ? Iohexol Rash  ?  CT scan contrast caused rash and itching    does fine with premeds.  ? Pravastatin   ?  Numbness in feet   ? Iodine Hives and Rash  ?  CT scan contrast caused rash and itching   ? ? ?Outpatient Encounter Medications as of 12/06/2021  ?Medication Sig  ? acetaminophen (TYLENOL) 500 MG tablet Take 1,000 mg by mouth every 6 (six) hours as needed.  ? albuterol (VENTOLIN  HFA) 108 (90 Base) MCG/ACT inhaler Inhale 2 puffs into the lungs every 6 (six) hours as needed for wheezing or shortness of breath. (Patient not taking: Reported on 11/30/2021)  ? ALPRAZolam (XANAX) 0.25 MG tablet Take 1 tablet (0.25 mg total) by mouth at bedtime as needed for anxiety.  ? Artificial Tear Solution (SOOTHE XP OP) Place 1 drop into both eyes 2 (two) times daily.  ? carvedilol (COREG) 12.5 MG tablet TAKE 1 TABLET (12.5 MG TOTAL) BY MOUTH IN THE MORNING, AT NOON, AND AT BEDTIME.  ? carvedilol (COREG) 3.125 MG tablet TAKE 1 TABLET BY MOUTH 2 TIMES DAILY.  ? clopidogrel (PLAVIX) 75 MG tablet Take 1 tablet (75 mg total) by mouth daily.  ? [facility] IRON 325 (65 Fe) MG tablet TAKE 1 TABLET BY MOUTH DAILY  ? diphenhydrAMINE (BENADRYL) 50 MG tablet Take 1 tablet (50 mg total) by mouth once for 1 dose. Pt to take 50 mg of benadryl on 10/20/21 at 12:00 PM (noon). Please call [phone] with any questions.  ? fluticasone (FLONASE) 50 MCG/ACT nasal spray Place 1 spray into both nostrils daily. (Patient taking differently: Place 1 spray into both nostrils daily as needed for allergies.)  ? Fluticasone-Umeclidin-Vilant (TRELEGY ELLIPTA) 100-62.5-25 MCG/INH AEPB Inhale 1 puff into the lungs daily. (Patient not taking: Reported on 11/30/2021)  ? levothyroxine (SYNTHROID) 175 MCG tablet TAKE 1 TABLET BY MOUTH EVERY DAY  ? methocarbamol (ROBAXIN) 500 MG tablet Take 500 mg by mouth every 6 (six) hours as needed for muscle spasms.  ? nitroGLYCERIN (NITROSTAT) 0.4 MG  SL tablet Place 1 tablet (0.4 mg total) under the tongue every 5 (five) minutes as needed for chest pain.  ? pantoprazole (PROTONIX) 40 MG tablet Take 1 tablet (40 mg total) by mouth daily before breakfast.  ? polyethylene glycol powder (GLYCOLAX/MIRALAX) 17 GM/SCOOP powder Take 1 Container by mouth daily.  ? PRALUENT 150 MG/ML SOAJ INJECT 150 MG INTO THE SKIN EVERY 14 (FOURTEEN) DAYS.  ? predniSONE (DELTASONE) 50 MG tablet Take one tablet 13 hours, 7 hours and 1  hour before your exam.  ? predniSONE (DELTASONE) 50 MG tablet Pt to take 50 mg of prednisone on 10/20/21 at 12:00 AM (midnight), 50 mg of prednisone on 10/20/21 at 6:00 AM, and 50 mg of prednisone on 10/20/21 at 12:00 PM (noon). Pt is also to take 50 mg of benadryl on 10/20/21 at 12:00 PM (noon). Please call [phone] with any questions.  ? senna-docusate (SENNA S) 8.6-50 MG tablet 1 to 2 twice daily for constipation (Patient taking differently: Take 1-2 tablets by mouth 2 (two) times daily as needed for mild constipation or moderate constipation.)  ? tamsulosin (FLOMAX) 0.4 MG CAPS capsule Take 0.4 mg by mouth at bedtime.  ? ?No facility-administered encounter medications on file as of 12/06/2021.  ? ? ?Patient Active Problem List  ? Diagnosis Date Noted  ? COPD (chronic obstructive pulmonary disease) ([place]) 09/22/2021  ? Back pain 09/22/2021  ? Fever 06/29/2021  ? Acute blood loss anemia   ? Gastrointestinal hemorrhage associated with acute gastritis   ? Unstable angina ([place]) 05/25/2021  ? Coronary artery disease involving native coronary artery of native heart without angina pectoris 12/07/2020  ? SIRS (systemic inflammatory response syndrome) ([place]) 11/27/2020  ? Encephalitis   ? Thrombocytopenia ([place])   ? Severe sepsis without septic shock ([place]) 11/26/2020  ? 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  ? 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 Itching 11/17/2019  ? Dyslipidemia 07/14/2019  ? Shoulder pain 03/31/2019  ? Port-A-Cath in place 08/11/2018  ? Hypothyroidism (acquired) 08/11/2018  ? Metastasis to lung ([place]) 05/19/2018  ? Encounter for antineoplastic chemotherapy 05/13/2018  ? Encounter for antineoplastic immunotherapy 05/13/2018  ? Goals of care, counseling/discussion 05/13/2018  ? 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[place]) 05/13/2018  ? Axillary adenopathy 04/24/2018  ? Right lower lobe lung mass 04/22/2018  ? Cough with hemoptysis 04/22/2018  ? Wheezing 04/02/2018  ? Long-term use of  high-risk medication 04/02/2018  ? Dizziness 06/26/2017  ? Fatigue 04/30/2016  ? Insomnia 04/30/2016  ? Sinusitis 01/31/2016  ? Hyperlipidemia 01/31/2016  ? Ear pain 05/09/2015  ? Nephrolithiasis 04/18/2015  ? Anemia   ? Abnormal nuclear stress test   ? OBESITY, UNSPECIFIED 03/28/2009  ? Essential hypertension, benign 03/28/2009  ? PALPITATIONS 03/28/2009  ? SNORING 03/28/2009  ? ? ?Conditions to be addressed/monitored: monitor client management of grief issues faced ? ?Care Plan : LCSW Care Plan  ?Updates made by Katha Cabal, LCSW since 12/06/2021 12:00 AM  ?  ? ?Problem: Depression Identification (Depression)   ?  ? ?Goal: Depressive Symptoms Identified.  Manage Grief issues faced   ?Start Date: 11/08/2021  ?Expected End Date: 02/09/2022  ?This Visit's Progress: On track  ?Recent Progress: On track  ?Priority: Medium  ?Note:   ?CARE PLAN ENTRY ? ?Current Barriers:  ? ?Grief Issues related to recent death of his wife ?Stress issues ?Sleeping issues ? ?Clinical Social Work Goal(s):  ? ?Patient will talk with SW in next 30 days about grief management and about  depression management ?Patient will attend scheduled medical appointments in next 30 days ?Patient will call RNCM as needed in next 30 days to discuss nursing needs of client ? ?Interventions:  ?1:1 collaboration with Dr. Adam Phenix, DO regarding development and update of comprehensive plan of care as evidenced by provider attestation and co-signature. ?Reviewed client needs with [name] ?Reviewed grief issues management of client. Levelle said he had been participating via [employer] with [facility] program support. He said he had participated in 3 such meetings and that he planned to participate in another Grief Share meeting via zoom tonight.  He said he enjoys being in group and understanding more about grief process. He said he enjoys leader's group direction. LCSW congratulated [place] on his involvement with Grief Share  Group. ?Reviewed transport issues of client  Reviewed appetite of client. Reviewed sleeping issues of client. Ashir said he has some difficulty sleeping ?Discussed relaxation techniques. He goes to gym to work out , He parti

## 2021-12-06 NOTE — Patient Instructions (Addendum)
Visit Information ? ?Patient Goals:  Depressive Symptoms Identified.  Manage Depression issues. Manage Grief issues ? ?Time Frame:  Short Term Goal ?Priority:  Medium ?Progress: On Track ? ?Start Date: 11/08/21 ?End Date:  02/08/22   ? ?Follow Up Date:  Client asked for no follow up SW appointment to be set at present.  ? ?Depressive Symptoms Identified. Manage Depression issues. Manage Grief issues ? ?Patient Coping Skills ?Attends medical appointments ?Socializes with family and friends ?Exercises regularly ? ?Patient Deficits ? ?Depression issues ?Grief issues ? ?Patient Goals:   ?Attend medical appointments in next 30 days ?Socialize regularly with friends and family in next 47 days ?Learn more about grief support options in the area ? ?Follow Up Call. Client asked for no SW follow up appointment to be set at present  ? ?Norva Riffle.Teleah Villamar MSW, LCSW ?Licensed Clinical Social Worker ?[facility] ?[phone] ?

## 2021-12-07 ENCOUNTER — Ambulatory Visit: Payer: Medicare Other | Admitting: Cardiology

## 2021-12-08 DIAGNOSIS — E039 Hypothyroidism, unspecified: Secondary | ICD-10-CM

## 2021-12-08 DIAGNOSIS — D509 Iron deficiency anemia, unspecified: Secondary | ICD-10-CM

## 2021-12-08 DIAGNOSIS — I10 Essential (primary) hypertension: Secondary | ICD-10-CM

## 2021-12-08 DIAGNOSIS — F4321 Adjustment disorder with depressed mood: Secondary | ICD-10-CM

## 2021-12-14 ENCOUNTER — Other Ambulatory Visit: Payer: Self-pay

## 2021-12-14 ENCOUNTER — Inpatient Hospital Stay: Payer: Medicare Other | Attending: Internal Medicine

## 2021-12-14 ENCOUNTER — Other Ambulatory Visit: Payer: Medicare Other

## 2021-12-14 ENCOUNTER — Ambulatory Visit (HOSPITAL_COMMUNITY)
Admission: RE | Admit: 2021-12-14 | Discharge: 2021-12-14 | Disposition: A | Discharge status: Home / Self Care | Payer: Medicare Other | Source: Ambulatory Visit | Attending: Internal Medicine | Admitting: Internal Medicine

## 2021-12-14 DIAGNOSIS — N200 Calculus of kidney: Secondary | ICD-10-CM | POA: Insufficient documentation

## 2021-12-14 DIAGNOSIS — N400 Enlarged prostate without lower urinary tract symptoms: Secondary | ICD-10-CM | POA: Diagnosis not present

## 2021-12-14 DIAGNOSIS — C3490 Malignant neoplasm of unspecified part of unspecified bronchus or lung: Secondary | ICD-10-CM | POA: Diagnosis not present

## 2021-12-14 DIAGNOSIS — I7121 Aneurysm of the ascending aorta, without rupture: Secondary | ICD-10-CM | POA: Insufficient documentation

## 2021-12-14 DIAGNOSIS — I10 Essential (primary) hypertension: Secondary | ICD-10-CM | POA: Insufficient documentation

## 2021-12-14 DIAGNOSIS — E039 Hypothyroidism, unspecified: Secondary | ICD-10-CM | POA: Insufficient documentation

## 2021-12-14 DIAGNOSIS — K5730 Diverticulosis of large intestine without perforation or abscess without bleeding: Secondary | ICD-10-CM | POA: Insufficient documentation

## 2021-12-14 DIAGNOSIS — I2510 Atherosclerotic heart disease of native coronary artery without angina pectoris: Secondary | ICD-10-CM | POA: Diagnosis not present

## 2021-12-14 DIAGNOSIS — R918 Other nonspecific abnormal finding of lung field: Secondary | ICD-10-CM | POA: Diagnosis not present

## 2021-12-14 DIAGNOSIS — C3492 Malignant neoplasm of unspecified part of left bronchus or lung: Secondary | ICD-10-CM | POA: Insufficient documentation

## 2021-12-14 DIAGNOSIS — K7689 Other specified diseases of liver: Secondary | ICD-10-CM | POA: Diagnosis not present

## 2021-12-14 LAB — CBC WITH DIFFERENTIAL (CANCER CENTER ONLY)
Abs Immature Granulocytes: 0.04 10*3/uL (ref 0.00–0.07)
Basophils Absolute: 0 10*3/uL (ref 0.0–0.1)
Basophils Relative: 0 %
Eosinophils Absolute: 0 10*3/uL (ref 0.0–0.5)
Eosinophils Relative: 1 %
HCT: 37.3 % — ABNORMAL LOW (ref 39.0–52.0)
Hemoglobin: 12.8 g/dL — ABNORMAL LOW (ref 13.0–17.0)
Immature Granulocytes: 1 %
Lymphocytes Relative: 15 %
Lymphs Abs: 0.9 10*3/uL (ref 0.7–4.0)
MCH: 33.3 pg (ref 26.0–34.0)
MCHC: 34.3 g/dL (ref 30.0–36.0)
MCV: 97.1 fL (ref 80.0–100.0)
Monocytes Absolute: 0.7 10*3/uL (ref 0.1–1.0)
Monocytes Relative: 12 %
Neutro Abs: 4.3 10*3/uL (ref 1.7–7.7)
Neutrophils Relative %: 71 %
Platelet Count: 147 10*3/uL — ABNORMAL LOW (ref 150–400)
RBC: 3.84 MIL/uL — ABNORMAL LOW (ref 4.22–5.81)
RDW: 11.7 % (ref 11.5–15.5)
WBC Count: 6 10*3/uL (ref 4.0–10.5)
nRBC: 0 % (ref 0.0–0.2)

## 2021-12-14 LAB — CMP (CANCER CENTER ONLY)
ALT: 12 U/L (ref 0–44)
AST: 18 U/L (ref 15–41)
Albumin: 4.1 g/dL (ref 3.5–5.0)
Alkaline Phosphatase: 65 U/L (ref 38–126)
Anion gap: 4 — ABNORMAL LOW (ref 5–15)
BUN: 18 mg/dL (ref 8–23)
CO2: 30 mmol/L (ref 22–32)
Calcium: 9.4 mg/dL (ref 8.9–10.3)
Chloride: 106 mmol/L (ref 98–111)
Creatinine: 0.92 mg/dL (ref 0.61–1.24)
GFR, Estimated: >60 mL/min (ref >60)
Glucose, Bld: 110 mg/dL — ABNORMAL HIGH (ref 70–99)
Potassium: 4.8 mmol/L (ref 3.5–5.1)
Sodium: 140 mmol/L (ref 135–145)
Total Bilirubin: 0.7 mg/dL (ref 0.3–1.2)
Total Protein: 7.4 g/dL (ref 6.5–8.1)

## 2021-12-14 LAB — TSH: TSH: 0.426 u[IU]/mL (ref 0.320–4.118)

## 2021-12-14 MED ORDER — IOHEXOL 300 MG/ML  SOLN
100.0000 mL | Freq: Once | INTRAMUSCULAR | Status: AC | PRN
  Administered 2021-12-14: 100 mL via INTRAVENOUS

## 2021-12-14 MED ORDER — SODIUM CHLORIDE (PF) 0.9 % IJ SOLN
INTRAMUSCULAR | Status: AC
  Filled 2021-12-14: qty 50

## 2021-12-18 ENCOUNTER — Inpatient Hospital Stay (HOSPITAL_BASED_OUTPATIENT_CLINIC_OR_DEPARTMENT_OTHER): Payer: Medicare Other | Admitting: Internal Medicine

## 2021-12-18 ENCOUNTER — Other Ambulatory Visit: Payer: Self-pay

## 2021-12-18 VITALS — BP 122/68 | HR 81 | Temp 97.9°F | Resp 20 | Ht 74.0 in | Wt 225.6 lb

## 2021-12-18 DIAGNOSIS — C3490 Malignant neoplasm of unspecified part of unspecified bronchus or lung: Secondary | ICD-10-CM | POA: Diagnosis not present

## 2021-12-18 DIAGNOSIS — I10 Essential (primary) hypertension: Secondary | ICD-10-CM | POA: Diagnosis not present

## 2021-12-18 DIAGNOSIS — E039 Hypothyroidism, unspecified: Secondary | ICD-10-CM | POA: Diagnosis not present

## 2021-12-18 DIAGNOSIS — C3492 Malignant neoplasm of unspecified part of left bronchus or lung: Secondary | ICD-10-CM | POA: Diagnosis not present

## 2021-12-18 NOTE — Progress Notes (Signed)
?    [place] ?Telephone:([phone]) [phone]   [phone] ? ?OFFICE PROGRESS NOTE ? ?[name], DO ?[place] [address] ? ?DIAGNOSIS: Stage IV (T3, N0, M1c) non-small cell lung cancer, squamous cell carcinoma presented with large right infrahilar mass in addition to left upper lobe lung nodule as well as left axillary mass with left axillary lymph node diagnosed in August 2019. ? ?PRIOR THERAPY:  ?1) Palliative radiotherapy to the right infrahilar mass as well as the axillary mass under the care of Dr. Lisbeth Renshaw. ?2) Systemic chemotherapy with carboplatin for AUC of 5, paclitaxel 175 mg/M2 and Keytruda 200 mg IV every 3 weeks status post 4 cycles. ?3) Maintenance immunotherapy with single agent Keytruda 200 mg IV every 3 weeks status post 41 cycles.  His treatment is currently on hold secondary to intolerance. ? ?CURRENT THERAPY: Observation. ? ?INTERVAL HISTORY: ?Travis Padilla 76 y.o. male returns to the clinic today for follow-up visit.  The patient is feeling fine today with no concerning complaints but he still very sad after he lost his wife to lymphoma in January 2023.  He denied having any current chest pain, shortness of breath, cough or hemoptysis.  He denied having any fever or chills.  He has no nausea, vomiting, diarrhea or constipation.  He has no headache or visual changes.  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  He is here today for evaluation with repeat CT scan of the chest, abdomen pelvis for restaging of his disease. ? ?MEDICAL HISTORY: ?Past Medical History:  ?Diagnosis Date  ? Abnormal nuclear stress test   ? December, 2013  ? Anemia   ? Axillary adenopathy 04/24/2018  ? CAD (coronary artery disease)   ? Mild nonobstructive plaque in cath 2013  ? Chest pain   ? December, 2013  ? Cough with hemoptysis 04/22/2018  ? Dizziness   ? Dyslipidemia 07/14/2019  ? Encounter for  antineoplastic immunotherapy 05/13/2018  ? Gout   ? Hemorrhoid   ? Hyperlipidemia   ? Hypertension   ? Hypothyroidism (acquired) 08/11/2018  ? 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) [facility]) dx'd 04/17/18  ? to LN, infrahilar mass, lung nodule and lt axilla  ? Obesity, unspecified 03/28/2009  ? Qualifier: Diagnosis of  By: [name], MD, [name]    ? Palpitations 03/28/2009  ? Qualifier: Diagnosis of  By: [name], MD, [name]    ? Right lower lobe lung mass 04/22/2018  ? Skin cancer   ? Sleep apnea   ? SNORING 03/28/2009  ? Qualifier: Diagnosis of  By: [name], MD, [name]    ? Spinal stenosis   ? 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[place]) 05/13/2018  ? ? ?ALLERGIES:  is allergic to iohexol, pravastatin, and iodine. ? ?MEDICATIONS:  ?Current Outpatient Medications  ?Medication Sig Dispense Refill  ? acetaminophen (TYLENOL) 500 MG tablet Take 1,000 mg by mouth every 6 (six) hours as needed.    ? ALPRAZolam (XANAX) 0.25 MG tablet Take 1 tablet (0.25 mg total) by mouth at bedtime as needed for anxiety. 30 tablet 0  ? Artificial Tear Solution (SOOTHE XP OP) Place 1 drop into both eyes 2 (two) times daily.    ? carvedilol (COREG) 12.5 MG tablet TAKE 1 TABLET (12.5 MG TOTAL) BY MOUTH IN THE MORNING, AT NOON, AND AT  BEDTIME. 270 tablet 3  ? carvedilol (COREG) 3.125 MG tablet TAKE 1 TABLET BY MOUTH 2 TIMES DAILY. 60 tablet 3  ? clopidogrel (PLAVIX) 75 MG tablet Take 1 tablet (75 mg total) by mouth daily. 90 tablet 2  ? CVS IRON 325 (65 Fe) MG tablet TAKE 1 TABLET BY MOUTH DAILY 90 tablet 1  ? fluticasone (FLONASE) 50 MCG/ACT nasal spray Place 1 spray into both nostrils daily. (Patient taking differently: Place 1 spray into both nostrils daily as needed for allergies.) 16 g 3  ? levothyroxine (SYNTHROID) 175 MCG tablet TAKE 1 TABLET BY MOUTH EVERY DAY 90 tablet 0  ? methocarbamol (ROBAXIN) 500 MG tablet Take 500 mg by mouth every 6 (six) hours as  needed for muscle spasms.    ? pantoprazole (PROTONIX) 40 MG tablet Take 1 tablet (40 mg total) by mouth daily before breakfast. 90 tablet 2  ? polyethylene glycol powder (GLYCOLAX/MIRALAX) 17 GM/SCOOP powder Take 1 Container by mouth daily.    ? PRALUENT 150 MG/ML SOAJ INJECT 150 MG INTO THE SKIN EVERY 14 (FOURTEEN) DAYS. 6 mL 3  ? predniSONE (DELTASONE) 50 MG tablet Take one tablet 13 hours, 7 hours and 1 hour before your exam. 3 tablet 1  ? predniSONE (DELTASONE) 50 MG tablet Pt to take 50 mg of prednisone on 10/20/21 at 12:00 AM (midnight), 50 mg of prednisone on 10/20/21 at 6:00 AM, and 50 mg of prednisone on 10/20/21 at 12:00 PM (noon). Pt is also to take 50 mg of benadryl on 10/20/21 at 12:00 PM (noon). Please call [phone] with any questions. 3 tablet 0  ? senna-docusate (SENNA S) 8.6-50 MG tablet 1 to 2 twice daily for constipation (Patient taking differently: Take 1-2 tablets by mouth 2 (two) times daily as needed for mild constipation or moderate constipation.) 120 tablet 5  ? tamsulosin (FLOMAX) 0.4 MG CAPS capsule Take 0.4 mg by mouth at bedtime.    ? albuterol (VENTOLIN HFA) 108 (90 Base) MCG/ACT inhaler Inhale 2 puffs into the lungs every 6 (six) hours as needed for wheezing or shortness of breath. (Patient not taking: Reported on 11/30/2021) 8 g 6  ? diphenhydrAMINE (BENADRYL) 50 MG tablet Take 1 tablet (50 mg total) by mouth once for 1 dose. Pt to take 50 mg of benadryl on 10/20/21 at 12:00 PM (noon). Please call [phone] with any questions. 1 tablet 0  ? Fluticasone-Umeclidin-Vilant (TRELEGY ELLIPTA) 100-62.5-25 MCG/INH AEPB Inhale 1 puff into the lungs daily. (Patient not taking: Reported on 11/30/2021) 60 each 6  ? nitroGLYCERIN (NITROSTAT) 0.4 MG SL tablet Place 1 tablet (0.4 mg total) under the tongue every 5 (five) minutes as needed for chest pain. (Patient not taking: Reported on 12/18/2021) 25 tablet 2  ? ?No current facility-administered medications for this visit.  ? ? ?SURGICAL HISTORY:   ?Past Surgical History:  ?Procedure Laterality Date  ? basal skin cancer N/A 2019  ? Nose  ? BELPHAROPTOSIS REPAIR Bilateral   ? CARDIAC CATHETERIZATION    ? CATARACT EXTRACTION W/ INTRAOCULAR LENS  IMPLANT, BILATERAL    ? COLONOSCOPY N/A 11/03/2014  ? Procedure: COLONOSCOPY;  Surgeon: Rogene Houston, MD;  [facility]: [facility];  Service: Endoscopy;  Laterality: N/A;  1225  ? CORONARY ATHERECTOMY N/A 05/26/2021  ? Procedure: CORONARY ATHERECTOMY;  Surgeon: Early Osmond, MD;  Location: [place] [facility];  Service: Cardiovascular;  Laterality: N/A;  ? CORONARY STENT INTERVENTION N/A 05/25/2021  ? Procedure: CORONARY STENT INTERVENTION;  Surgeon: Nelva Bush, MD;  Location: [place] [facility];  Service: Cardiovascular;  Laterality: N/A;  ? CORONARY STENT INTERVENTION N/A 05/26/2021  ? Procedure: CORONARY STENT INTERVENTION;  Surgeon: Early Osmond, MD;  Location: [place] [facility] LAB;  Service: Cardiovascular;  Laterality: N/A;  ? ESOPHAGOGASTRODUODENOSCOPY (EGD) WITH PROPOFOL N/A 05/29/2021  ? Procedure: ESOPHAGOGASTRODUODENOSCOPY (EGD) WITH PROPOFOL;  Surgeon: Gatha Mayer, MD;  Location[facility] ENDOSCOPY;  Service: Endoscopy;  Laterality: N/A;  ? INTRAVASCULAR ULTRASOUND/IVUS N/A 05/26/2021  ? Procedure: Intravascular Ultrasound/IVUS;  Surgeon: Early Osmond, MD;  Location: [place] CV LAB;  Service: Cardiovascular;  Laterality: N/A;  ? IR CV LINE INJECTION  08/24/2020  ? IR FLUORO GUIDED NEEDLE PLC ASPIRATION/INJECTION LOC  11/28/2020  ? IR IMAGING GUIDED PORT INSERTION  07/11/2018  ? KNEE CARTILAGE SURGERY Left   ? Left knee  ? LEFT HEART CATH AND CORONARY ANGIOGRAPHY N/A 05/25/2021  ? Procedure: LEFT HEART CATH AND CORONARY ANGIOGRAPHY;  Surgeon: Nelva Bush, MD;  Location: [place] CV LAB;  Service: Cardiovascular;  Laterality: N/A;  ? SKIN CANCER EXCISION  12/2014, 04/25/15  ? TEMPORARY PACEMAKER N/A 05/26/2021  ? Procedure: TEMPORARY PACEMAKER;  Surgeon: Early Osmond, MD;  Location: [place] CV LAB;  Service: Cardiovascular;  Laterality: N/A;  ? VIDEO BRONCHOSCOPY Bilateral 05/09/2018  ? Procedure: VIDEO BRONCHOSCOPY WITH FLUORO;  Surgeon: Juanito Doom, MD;  Location: [facility] ENDOSCOPY;  Ser

## 2021-12-19 ENCOUNTER — Encounter: Payer: Self-pay | Admitting: Cardiology

## 2021-12-19 NOTE — Telephone Encounter (Signed)
Per last MD note:  ?Palpitations: He has nonsustained supra and ventricular arrhythmias but at this point its not problematic enough to necessitate change in therapy.  He actually got down on his beta-blocker previously so I will uptitrate.  However, he will let me know if symptoms worsen. ?

## 2021-12-25 NOTE — Progress Notes (Signed)
?Radiation Oncology         [phone] ?________________________________ ? ?Name: Travis Padilla        MRN: [id number]  ?Date of Service: 12/26/2021 DOB: 01-25-1946 ? ?RF:[id number], Koleen Distance, DO  Curt Bears, MD    ? ?REFERRING PHYSICIAN: Curt Bears, MD ? ? ?DIAGNOSIS: The encounter diagnosis was Malignant neoplasm of upper lobe of left lung ([place]). ? ? ?HISTORY OF PRESENT ILLNESS: Travis Padilla is a 76 y.o. male seen at the request of Dr. 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 The patient was treated in 2019 to the right lung and left axilla with  palliative radiotherapy.  He completed a course of palliative immunotherapy  in March 2022 and had to discontinue due to toxicities. He has since been followed in surveillance. He has a left upper lobe nodule that has been increasing slowly in size.  Recent restaging on 12/14/2021 showed persistent left upper lobe mass enlargement up to 3.9 cm, previously 3.3 cm in greatest dimension stability in the left axilla and posttreatment change in the right lung were noted.  He is seen to consider additional radiotherapy options to the left lung. ? ? ? ?PREVIOUS RADIATION THERAPY: Yes  ? ? 04/30/2018 - 05/21/2018 ?1. Right Lung / 37.5 Gy in 15 fractions ?2. Left Axilla / 37.5 Gy in 15 fractions ? ? ?PAST MEDICAL HISTORY:  ?Past Medical History:  ?Diagnosis Date  ? Abnormal nuclear stress test   ? December, 2013  ? Anemia   ? Axillary adenopathy 04/24/2018  ? CAD (coronary artery disease)   ? Mild nonobstructive plaque in cath 2013  ? Chest pain   ? December, 2013  ? Cough with hemoptysis 04/22/2018  ? Dizziness   ? Dyslipidemia 07/14/2019  ? Encounter for antineoplastic immunotherapy 05/13/2018  ? Gout   ? Hemorrhoid   ? Hyperlipidemia   ? Hypertension   ? Hypothyroidism (acquired) 08/11/2018  ? Metastasis to lung ([place]) 05/19/2018  ? Metastatic  lung cancer (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 other site) [facility]) dx'd 04/17/18  ? to LN, infrahilar mass, lung nodule and lt axilla  ? Obesity, unspecified 03/28/2009  ? Qualifier: Diagnosis of  By: [name], MD, [name]    ? Palpitations 03/28/2009  ? Qualifier: Diagnosis of  By: [name], MD, [name]    ? Right lower lobe lung mass 04/22/2018  ? Skin cancer   ? Sleep apnea   ? SNORING 03/28/2009  ? Qualifier: Diagnosis of  By: [name], MD, [name]    ? Spinal stenosis   ? 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[place]) 05/13/2018  ?   ? ? ?PAST SURGICAL HISTORY: ?Past Surgical History:  ?Procedure Laterality Date  ? basal skin cancer N/A 2019  ? Nose  ? BELPHAROPTOSIS REPAIR Bilateral   ? CARDIAC CATHETERIZATION    ? CATARACT EXTRACTION W/ INTRAOCULAR LENS  IMPLANT, BILATERAL    ? COLONOSCOPY N/A 11/03/2014  ? Procedure: COLONOSCOPY;  Surgeon: Rogene Houston, MD;  [facility];  Service: Endoscopy;  Laterality: N/A;  1225  ? CORONARY ATHERECTOMY N/A 05/26/2021  ? Procedure: CORONARY ATHERECTOMY;  Surgeon: Early Osmond, MD;  Location: [place] CV LAB;  Service: Cardiovascular;  Laterality: N/A;  ? CORONARY STENT INTERVENTION N/A 05/25/2021  ? Procedure: CORONARY STENT INTERVENTION;  Surgeon: Nelva Bush, MD;  Location: [place] CV LAB;  Service:  Cardiovascular;  Laterality: N/A;  ? CORONARY STENT INTERVENTION N/A 05/26/2021  ? Procedure: CORONARY STENT INTERVENTION;  Surgeon: Early Osmond, MD;  Location: [place] CV LAB;  Service: Cardiovascular;  Laterality: N/A;  ? ESOPHAGOGASTRODUODENOSCOPY (EGD) WITH PROPOFOL N/A 05/29/2021  ? Procedure: ESOPHAGOGASTRODUODENOSCOPY (EGD) WITH PROPOFOL;  Surgeon: Gatha Mayer, MD;  Location: [facility] ENDOSCOPY;  Service: Endoscopy;  Laterality: N/A;  ? INTRAVASCULAR ULTRASOUND/IVUS N/A 05/26/2021  ? Procedure: Intravascular Ultrasound/IVUS;  Surgeon: Early Osmond, MD;  Location: [place] CV LAB;  Service: Cardiovascular;  Laterality: N/A;  ?  IR CV LINE INJECTION  08/24/2020  ? IR FLUORO GUIDED NEEDLE PLC ASPIRATION/INJECTION LOC  11/28/2020  ? IR IMAGING GUIDED PORT INSERTION  07/11/2018  ? KNEE CARTILAGE SURGERY Left   ? Left knee  ? LEFT HEART CATH AND CORONARY ANGIOGRAPHY N/A 05/25/2021  ? Procedure: LEFT HEART CATH AND CORONARY ANGIOGRAPHY;  Surgeon: Nelva Bush, MD;  Location: [place] CV LAB;  Service: Cardiovascular;  Laterality: N/A;  ? SKIN CANCER EXCISION  12/2014, 04/25/15  ? TEMPORARY PACEMAKER N/A 05/26/2021  ? Procedure: TEMPORARY PACEMAKER;  Surgeon: Early Osmond, MD;  Location: [place] CV LAB;  Service: Cardiovascular;  Laterality: N/A;  ? VIDEO BRONCHOSCOPY Bilateral 05/09/2018  ? Procedure: VIDEO BRONCHOSCOPY WITH FLUORO;  Surgeon: Juanito Doom, MD;  Location: [facility];  Service: Cardiopulmonary;  Laterality: Bilateral;  ? ? ? ?FAMILY HISTORY:  ?Family History  ?Problem Relation Age of Onset  ? Heart attack Father   ? Heart failure Mother   ? Parkinson's disease Mother   ? Healthy Sister   ? Skin cancer Sister   ? Neuropathy Brother   ? COPD Brother   ? Epilepsy Brother   ? Healthy Sister   ? Healthy Sister   ? Colon cancer Neg Hx   ? ? ? ?SOCIAL HISTORY:  reports that he quit smoking about 51 years ago. His smoking use included cigarettes. He started smoking about 53 years ago. He has a 0.40 pack-year smoking history. He has never used smokeless tobacco. He reports that he does not drink alcohol and does not use drugs. The patient is widowed and lives in [place], [place].  ? ? ?ALLERGIES: Iohexol, Pravastatin, and Iodine ? ? ?MEDICATIONS:  ?Current Outpatient Medications  ?Medication Sig Dispense Refill  ? acetaminophen (TYLENOL) 500 MG tablet Take 1,000 mg by mouth every 6 (six) hours as needed.    ? ALPRAZolam (XANAX) 0.25 MG tablet Take 1 tablet (0.25 mg total) by mouth at bedtime as needed for anxiety. 30 tablet 0  ? Artificial Tear Solution (SOOTHE XP OP) Place 1 drop into both eyes 2 (two) times daily.    ?  carvedilol (COREG) 12.5 MG tablet TAKE 1 TABLET (12.5 MG TOTAL) BY MOUTH IN THE MORNING, AT NOON, AND AT BEDTIME. 270 tablet 3  ? carvedilol (COREG) 3.125 MG tablet TAKE 1 TABLET BY MOUTH 2 TIMES DAILY. 60 tablet 3  ? clopidogrel (PLAVIX) 75 MG tablet Take 1 tablet (75 mg total) by mouth daily. 90 tablet 2  ? CVS IRON 325 (65 Fe) MG tablet TAKE 1 TABLET BY MOUTH DAILY 90 tablet 1  ? fluticasone (FLONASE) 50 MCG/ACT nasal spray Place 1 spray into both nostrils daily. (Patient taking differently: Place 1 spray into both nostrils daily as needed for allergies.) 16 g 3  ? levothyroxine (SYNTHROID) 175 MCG tablet TAKE 1 TABLET BY MOUTH EVERY DAY 90 tablet 0  ? methocarbamol (ROBAXIN) 500 MG tablet Take 500  mg by mouth every 6 (six) hours as needed for muscle spasms.    ? pantoprazole (PROTONIX) 40 MG tablet Take 1 tablet (40 mg total) by mouth daily before breakfast. 90 tablet 2  ? polyethylene glycol powder (GLYCOLAX/MIRALAX) 17 GM/SCOOP powder Take 1 Container by mouth daily.    ? PRALUENT 150 MG/ML SOAJ INJECT 150 MG INTO THE SKIN EVERY 14 (FOURTEEN) DAYS. 6 mL 3  ? predniSONE (DELTASONE) 50 MG tablet Take one tablet 13 hours, 7 hours and 1 hour before your exam. 3 tablet 1  ? predniSONE (DELTASONE) 50 MG tablet Pt to take 50 mg of prednisone on 10/20/21 at 12:00 AM (midnight), 50 mg of prednisone on 10/20/21 at 6:00 AM, and 50 mg of prednisone on 10/20/21 at 12:00 PM (noon). Pt is also to take 50 mg of benadryl on 10/20/21 at 12:00 PM (noon). Please call [phone] with any questions. 3 tablet 0  ? senna-docusate (SENNA S) 8.6-50 MG tablet 1 to 2 twice daily for constipation (Patient taking differently: Take 1-2 tablets by mouth 2 (two) times daily as needed for mild constipation or moderate constipation.) 120 tablet 5  ? tamsulosin (FLOMAX) 0.4 MG CAPS capsule Take 0.4 mg by mouth at bedtime.    ? albuterol (VENTOLIN HFA) 108 (90 Base) MCG/ACT inhaler Inhale 2 puffs into the lungs every 6 (six) hours as needed for  wheezing or shortness of breath. (Patient not taking: Reported on 11/30/2021) 8 g 6  ? diphenhydrAMINE (BENADRYL) 50 MG tablet Take 1 tablet (50 mg total) by mouth once for 1 dose. Pt to take 50 mg of benad

## 2021-12-25 NOTE — Progress Notes (Signed)
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 ? ?Patient was diagnosed in 2019 with Lung cancer and was noted on recent restaging scans to have left upper lobe mass enlargement up to 3.9 cm, previously 3.3 cm. ? ?CT CAP 12/14/2021: The left upper lobe mass has enlarged, currently 3.9 by 3.4 cm and previously 3.1 by 3.3 cm.  The left axillary nodule/lymph node appears stable.  Stable post therapy related findings in the right lung. ? ? ?Biopsies of  ? ?Tobacco/Marijuana/Snuff/ETOH use: Former Smoker ? ?Past/Anticipated interventions by cardiothoracic surgery, if any:  ? ? ?Past/Anticipated interventions by medical oncology, if any:  ?Dr. Julien Nordmann 12/18/2021 ?-He had repeat CT scan of the chest, abdomen pelvis performed recently.  His scan showed stable disease except for mildly enlarged left upper lobe lung mass. ?-I recommended for the patient to see Dr. Lisbeth Renshaw for consideration of SBRT to the enlarging left upper lobe lung mass. ?-I will continue to monitor him closely with repeat CT scan of the chest, abdomen pelvis in 4 months.  If he develop any additional disease progression and a multifocal way, I may consider resuming his systemic therapy again. ? ? ?Signs/Symptoms ?Weight changes, if any: No ?Respiratory complaints, if any: Good right now ?Hemoptysis, if any: Has occasional non productive cough, denies hemoptysis. ?Pain issues, if any: No  ? ?SAFETY ISSUES: ?Prior radiation? Right Lung 37.5 Gy in 15 fractions, Left Axilla 37.5 Gy in 15 fractions 8/21-9/07/2018. ?Pacemaker/ICD?  No ?Possible current pregnancy? N/a ?Is the patient on methotrexate? No ? ?Current Complaints / other details:   ?He reports feeling irregular Heartbeat, seeing Dr. [name] ?

## 2021-12-26 ENCOUNTER — Ambulatory Visit
Admission: RE | Admit: 2021-12-26 | Discharge: 2021-12-26 | Disposition: A | Discharge status: Home / Self Care | Payer: Medicare Other | Source: Ambulatory Visit | Attending: Radiation Oncology | Admitting: Radiation Oncology

## 2021-12-26 ENCOUNTER — Other Ambulatory Visit: Payer: Self-pay

## 2021-12-26 ENCOUNTER — Encounter: Payer: Self-pay | Admitting: Radiation Oncology

## 2021-12-26 VITALS — Ht 74.0 in | Wt 225.0 lb

## 2021-12-26 DIAGNOSIS — C3412 Malignant neoplasm of upper lobe, left bronchus or lung: Secondary | ICD-10-CM | POA: Diagnosis not present

## 2021-12-26 DIAGNOSIS — Z87891 Personal history of nicotine dependence: Secondary | ICD-10-CM | POA: Diagnosis not present

## 2021-12-27 ENCOUNTER — Ambulatory Visit: Payer: Medicare Other | Admitting: Cardiology

## 2021-12-27 ENCOUNTER — Ambulatory Visit
Admission: RE | Admit: 2021-12-27 | Discharge: 2021-12-27 | Disposition: A | Discharge status: Home / Self Care | Payer: Medicare Other | Source: Ambulatory Visit | Attending: Radiation Oncology | Admitting: Radiation Oncology

## 2021-12-27 ENCOUNTER — Other Ambulatory Visit: Payer: Self-pay

## 2021-12-27 DIAGNOSIS — C3412 Malignant neoplasm of upper lobe, left bronchus or lung: Secondary | ICD-10-CM | POA: Diagnosis not present

## 2021-12-27 DIAGNOSIS — Z87891 Personal history of nicotine dependence: Secondary | ICD-10-CM | POA: Diagnosis not present

## 2022-01-03 DIAGNOSIS — Z87891 Personal history of nicotine dependence: Secondary | ICD-10-CM | POA: Diagnosis not present

## 2022-01-03 DIAGNOSIS — C3412 Malignant neoplasm of upper lobe, left bronchus or lung: Secondary | ICD-10-CM | POA: Diagnosis not present

## 2022-01-09 ENCOUNTER — Ambulatory Visit
Admission: RE | Admit: 2022-01-09 | Discharge: 2022-01-09 | Disposition: A | Discharge status: Home / Self Care | Payer: Medicare Other | Source: Ambulatory Visit | Attending: Radiation Oncology | Admitting: Radiation Oncology

## 2022-01-09 ENCOUNTER — Other Ambulatory Visit: Payer: Self-pay

## 2022-01-09 DIAGNOSIS — C3412 Malignant neoplasm of upper lobe, left bronchus or lung: Secondary | ICD-10-CM | POA: Insufficient documentation

## 2022-01-09 LAB — RAD ONC ARIA SESSION SUMMARY
Course Elapsed Days: 0
Plan Fractions Treated to Date: 1
Plan Prescribed Dose Per Fraction: 12 Gy
Plan Total Fractions Prescribed: 5
Plan Total Prescribed Dose: 60 Gy
Reference Point Dosage Given to Date: 12 Gy
Reference Point Session Dosage Given: 12 Gy
Session Number: 1

## 2022-01-09 NOTE — Progress Notes (Signed)
Spoke with pt, he reports elevated heart rates on a daily bases. He notices it in the morning when he wakes up and then later in the evening. He reports it last from 30 min to 1 hour. He denies chest pain. He has increased his carvedilol to 25 mg twice daily and has not seen a difference. He reports he is not able to relax and does breathing exercises to help with the elevated rates. Aware will forward to dr hochrein to review and advise. ?

## 2022-01-10 ENCOUNTER — Ambulatory Visit: Payer: Medicare Other | Admitting: Radiation Oncology

## 2022-01-11 ENCOUNTER — Other Ambulatory Visit: Payer: Self-pay

## 2022-01-11 ENCOUNTER — Ambulatory Visit
Admission: RE | Admit: 2022-01-11 | Discharge: 2022-01-11 | Disposition: A | Discharge status: Home / Self Care | Payer: Medicare Other | Source: Ambulatory Visit | Attending: Radiation Oncology | Admitting: Radiation Oncology

## 2022-01-11 DIAGNOSIS — C3412 Malignant neoplasm of upper lobe, left bronchus or lung: Secondary | ICD-10-CM | POA: Diagnosis not present

## 2022-01-11 LAB — RAD ONC ARIA SESSION SUMMARY
Course Elapsed Days: 2
Plan Fractions Treated to Date: 2
Plan Prescribed Dose Per Fraction: 12 Gy
Plan Total Fractions Prescribed: 5
Plan Total Prescribed Dose: 60 Gy
Reference Point Dosage Given to Date: 24 Gy
Reference Point Session Dosage Given: 12 Gy
Session Number: 2

## 2022-01-12 ENCOUNTER — Ambulatory Visit: Payer: Medicare Other | Admitting: Radiation Oncology

## 2022-01-14 ENCOUNTER — Other Ambulatory Visit: Payer: Self-pay | Admitting: Family Medicine

## 2022-01-14 DIAGNOSIS — E039 Hypothyroidism, unspecified: Secondary | ICD-10-CM

## 2022-01-15 ENCOUNTER — Ambulatory Visit
Admission: RE | Admit: 2022-01-15 | Discharge: 2022-01-15 | Disposition: A | Discharge status: Home / Self Care | Payer: Medicare Other | Source: Ambulatory Visit | Attending: Radiation Oncology | Admitting: Radiation Oncology

## 2022-01-15 ENCOUNTER — Other Ambulatory Visit: Payer: Self-pay

## 2022-01-15 DIAGNOSIS — C3412 Malignant neoplasm of upper lobe, left bronchus or lung: Secondary | ICD-10-CM | POA: Diagnosis not present

## 2022-01-15 LAB — RAD ONC ARIA SESSION SUMMARY
Course Elapsed Days: 6
Plan Fractions Treated to Date: 3
Plan Prescribed Dose Per Fraction: 12 Gy
Plan Total Fractions Prescribed: 5
Plan Total Prescribed Dose: 60 Gy
Reference Point Dosage Given to Date: 36 Gy
Reference Point Session Dosage Given: 12 Gy
Session Number: 3

## 2022-01-17 ENCOUNTER — Other Ambulatory Visit: Payer: Self-pay

## 2022-01-17 ENCOUNTER — Ambulatory Visit
Admission: RE | Admit: 2022-01-17 | Discharge: 2022-01-17 | Disposition: A | Discharge status: Home / Self Care | Payer: Medicare Other | Source: Ambulatory Visit | Attending: Radiation Oncology | Admitting: Radiation Oncology

## 2022-01-17 DIAGNOSIS — M259 Joint disorder, unspecified: Secondary | ICD-10-CM | POA: Diagnosis not present

## 2022-01-17 DIAGNOSIS — C3412 Malignant neoplasm of upper lobe, left bronchus or lung: Secondary | ICD-10-CM | POA: Diagnosis not present

## 2022-01-17 LAB — RAD ONC ARIA SESSION SUMMARY
Course Elapsed Days: 8
Plan Fractions Treated to Date: 4
Plan Prescribed Dose Per Fraction: 12 Gy
Plan Total Fractions Prescribed: 5
Plan Total Prescribed Dose: 60 Gy
Reference Point Dosage Given to Date: 48 Gy
Reference Point Session Dosage Given: 12 Gy
Session Number: 4

## 2022-01-17 NOTE — Progress Notes (Signed)
?  ?Cardiology Office Note ? ? ?Date:  01/18/2022  ? ?ID:  Travis Padilla, DOB Dec 19, 1945, MRN [id number] ? ?PCP:  [name], DO  ?Cardiologist:   [name], MD ? ? ?Chief Complaint  ?Patient presents with  ? Palpitations  ? ? ? ?  ?History of Present Illness: ?Travis Padilla is a 76 y.o. male who presents for follow-up of coronary artery disease.Outpatient cardiac catheterization was scheduled.he had chest discomfort in 2022.  He underwent LHC 05/25/2021 which showed 80% mid LAD (chronic total occlusion) and sequential 70-95% proximal mid RCA lesion.  He received DES x1 to his LAD.  He had staged PCI 05/26/2021 with successful coronary arthrectomy and stenting of high-grade calcified lesions of proximal-mid RCA.  After the procedure he was noted to have anemia due to GI bleed.  His hemoglobin went from 12 down to 7.9 on 9/17.  He was noted to be FOBT positive.  His dual antiplatelet therapy was briefly held in setting of concern for cardiogenic shock.  He was bridged with cangrelor.  He received 3 units of PRBCs.  He was evaluated by GI and underwent EGD which showed erosive gastropathy with no active bleeding.  It was felt that it was his prior source of melena.  His H. pylori was negative.  He received IV iron prior to discharge.  Supplemental iron was added to his medication regimen.  There were recommendations to continue on antiplatelet therapy.  He was continued on Plavix without aspirin.  Recommendation for Protonix 40 mg daily was also made. He had a a monitor by his primary provider recently because of palpitations and had NSR with some runs of NSVT.   ? ?Since I last saw him he still has a sensation that his heart is just working hard.  His blood pressure has not been elevated.  I do not see evidence of tachycardia.  He does not have chest discomfort, neck or arm discomfort.  He is not describing shortness of breath, PND or orthopnea.  He works out 4 days a week.  He is not describing  palpitations, presyncope or syncope.  He is going through radiation therapies.  He also lost his wife and is still grieving this.   Because of this sensation we did increase his carvedilol to 25 mg twice daily but he had no significant improvement with this.  He is tolerated that dose. ? ? ? ?Past Medical History:  ?Diagnosis Date  ? Abnormal nuclear stress test   ? December, 2013  ? Anemia   ? Axillary adenopathy 04/24/2018  ? CAD (coronary artery disease)   ? Mild nonobstructive plaque in cath 2013  ? Chest pain   ? December, 2013  ? Cough with hemoptysis 04/22/2018  ? Dizziness   ? Dyslipidemia 07/14/2019  ? Encounter for antineoplastic immunotherapy 05/13/2018  ? Gout   ? Hemorrhoid   ? Hyperlipidemia   ? Hypertension   ? Hypothyroidism (acquired) 08/11/2018  ? Metastasis to lung ([place]) 05/19/2018  ? 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) [facility]) dx'd 04/17/18  ? to LN, infrahilar mass, lung nodule and lt axilla  ? Obesity, unspecified 03/28/2009  ? Qualifier: Diagnosis of  By: [name], MD, [name]    ? Palpitations 03/28/2009  ? Qualifier: Diagnosis of  By: [name], MD, [name]    ? Right lower lobe lung mass 04/22/2018  ? Skin cancer   ? Sleep apnea   ? SNORING 03/28/2009  ? Qualifier:  Diagnosis of  By: [name], MD, [name]    ? Spinal stenosis   ? 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[place]) 05/13/2018  ? ? ?Past Surgical History:  ?Procedure Laterality Date  ? basal skin cancer N/A 2019  ? Nose  ? BELPHAROPTOSIS REPAIR Bilateral   ? CARDIAC CATHETERIZATION    ? CATARACT EXTRACTION W/ INTRAOCULAR LENS  IMPLANT, BILATERAL    ? COLONOSCOPY N/A 11/03/2014  ? Procedure: COLONOSCOPY;  Surgeon: Rogene Houston, MD;  [facility];  Service: Endoscopy;  Laterality: N/A;  1225  ? CORONARY ATHERECTOMY N/A 05/26/2021  ? Procedure: CORONARY ATHERECTOMY;  Surgeon: Early Osmond, MD;  Location: [place] CV LAB;  Service: Cardiovascular;  Laterality: N/A;  ? CORONARY  STENT INTERVENTION N/A 05/25/2021  ? Procedure: CORONARY STENT INTERVENTION;  Surgeon: Nelva Bush, MD;  Location: [place] CV LAB;  Service: Cardiovascular;  Laterality: N/A;  ? CORONARY STENT INTERVENTION N/A 05/26/2021  ? Procedure: CORONARY STENT INTERVENTION;  Surgeon: Early Osmond, MD;  Location: [place] CV LAB;  Service: Cardiovascular;  Laterality: N/A;  ? ESOPHAGOGASTRODUODENOSCOPY (EGD) WITH PROPOFOL N/A 05/29/2021  ? Procedure: ESOPHAGOGASTRODUODENOSCOPY (EGD) WITH PROPOFOL;  Surgeon: Gatha Mayer, MD;  Location: [facility] ENDOSCOPY;  Service: Endoscopy;  Laterality: N/A;  ? INTRAVASCULAR ULTRASOUND/IVUS N/A 05/26/2021  ? Procedure: Intravascular Ultrasound/IVUS;  Surgeon: Early Osmond, MD;  Location: [place] CV LAB;  Service: Cardiovascular;  Laterality: N/A;  ? IR CV LINE INJECTION  08/24/2020  ? IR FLUORO GUIDED NEEDLE PLC ASPIRATION/INJECTION LOC  11/28/2020  ? IR IMAGING GUIDED PORT INSERTION  07/11/2018  ? KNEE CARTILAGE SURGERY Left   ? Left knee  ? LEFT HEART CATH AND CORONARY ANGIOGRAPHY N/A 05/25/2021  ? Procedure: LEFT HEART CATH AND CORONARY ANGIOGRAPHY;  Surgeon: Nelva Bush, MD;  Location: [place] CV LAB;  Service: Cardiovascular;  Laterality: N/A;  ? SKIN CANCER EXCISION  12/2014, 04/25/15  ? TEMPORARY PACEMAKER N/A 05/26/2021  ? Procedure: TEMPORARY PACEMAKER;  Surgeon: Early Osmond, MD;  Location: [place] CV LAB;  Service: Cardiovascular;  Laterality: N/A;  ? VIDEO BRONCHOSCOPY Bilateral 05/09/2018  ? Procedure: VIDEO BRONCHOSCOPY WITH FLUORO;  Surgeon: Juanito Doom, MD;  Location: [facility];  Service: Cardiopulmonary;  Laterality: Bilateral;  ? ? ? ?Current Outpatient Medications  ?Medication Sig Dispense Refill  ? acetaminophen (TYLENOL) 500 MG tablet Take 1,000 mg by mouth every 6 (six) hours as needed.    ? albuterol (VENTOLIN HFA) 108 (90 Base) MCG/ACT inhaler Inhale 2 puffs into the lungs every 6 (six) hours as needed for wheezing or shortness  of breath. 8 g 6  ? ALPRAZolam (XANAX) 0.25 MG tablet Take 1 tablet (0.25 mg total) by mouth at bedtime as needed for anxiety. 30 tablet 0  ? Artificial Tear Solution (SOOTHE XP OP) Place 1 drop into both eyes 2 (two) times daily.    ? clopidogrel (PLAVIX) 75 MG tablet Take 1 tablet (75 mg total) by mouth daily. 90 tablet 2  ? CVS IRON 325 (65 Fe) MG tablet TAKE 1 TABLET BY MOUTH DAILY 90 tablet 1  ? fluticasone (FLONASE) 50 MCG/ACT nasal spray Place 1 spray into both nostrils daily. (Patient taking differently: Place 1 spray into both nostrils daily as needed for allergies.) 16 g 3  ? levothyroxine (SYNTHROID) 175 MCG tablet TAKE 1 TABLET BY MOUTH EVERY DAY 90 tablet 0  ? methocarbamol (ROBAXIN) 500 MG tablet Take 500 mg by mouth every 6 (six)  hours as needed for muscle spasms.    ? pantoprazole (PROTONIX) 40 MG tablet Take 1 tablet (40 mg total) by mouth daily before breakfast. 90 tablet 2  ? polyethylene glycol powder (GLYCOLAX/MIRALAX) 17 GM/SCOOP powder Take 1 Container by mouth daily.    ? PRALUENT 150 MG/ML SOAJ INJECT 150 MG INTO THE SKIN EVERY 14 (FOURTEEN) DAYS. 6 mL 3  ? tamsulosin (FLOMAX) 0.4 MG CAPS capsule Take 0.4 mg by mouth at bedtime.    ? carvedilol (COREG) 25 MG tablet Take 1 tablet (25 mg total) by mouth 2 (two) times daily with a meal. 180 tablet 3  ? diphenhydrAMINE (BENADRYL) 50 MG tablet Take 1 tablet (50 mg total) by mouth once for 1 dose. Pt to take 50 mg of benadryl on 10/20/21 at 12:00 PM (noon). Please call [phone] with any questions. 1 tablet 0  ? Fluticasone-Umeclidin-Vilant (TRELEGY ELLIPTA) 100-62.5-25 MCG/INH AEPB Inhale 1 puff into the lungs daily. (Patient not taking: Reported on 11/30/2021) 60 each 6  ? nitroGLYCERIN (NITROSTAT) 0.4 MG SL tablet Place 1 tablet (0.4 mg total) under the tongue every 5 (five) minutes as needed for chest pain. (Patient not taking: Reported on 01/18/2022) 25 tablet 2  ? predniSONE (DELTASONE) 50 MG tablet Take one tablet 13 hours, 7 hours and 1  hour before your exam. (Patient not taking: Reported on 01/18/2022) 3 tablet 1  ? predniSONE (DELTASONE) 50 MG tablet Pt to take 50 mg of prednisone on 10/20/21 at 12:00 AM (midnight), 50 mg of prednisone on 10/20/21

## 2022-01-18 ENCOUNTER — Encounter: Payer: Self-pay | Admitting: Cardiology

## 2022-01-18 ENCOUNTER — Other Ambulatory Visit: Payer: Self-pay | Admitting: Physician Assistant

## 2022-01-18 ENCOUNTER — Ambulatory Visit (INDEPENDENT_AMBULATORY_CARE_PROVIDER_SITE_OTHER): Payer: Medicare Other | Admitting: Cardiology

## 2022-01-18 VITALS — BP 115/70 | HR 74 | Ht 74.0 in | Wt 227.4 lb

## 2022-01-18 DIAGNOSIS — I2510 Atherosclerotic heart disease of native coronary artery without angina pectoris: Secondary | ICD-10-CM | POA: Diagnosis not present

## 2022-01-18 DIAGNOSIS — D649 Anemia, unspecified: Secondary | ICD-10-CM | POA: Diagnosis not present

## 2022-01-18 DIAGNOSIS — E785 Hyperlipidemia, unspecified: Secondary | ICD-10-CM

## 2022-01-18 DIAGNOSIS — I10 Essential (primary) hypertension: Secondary | ICD-10-CM | POA: Diagnosis not present

## 2022-01-18 DIAGNOSIS — M545 Low back pain, unspecified: Secondary | ICD-10-CM

## 2022-01-18 MED ORDER — CARVEDILOL 25 MG PO TABS: 25.0000 mg | ORAL_TABLET | Freq: Two times a day (BID) | ORAL | 3 refills | Status: DC

## 2022-01-18 NOTE — Patient Instructions (Signed)
Medication Instructions:  Your physician recommends that you continue on your current medications as directed. Please refer to the Current Medication list given to you today.  *If you need a refill on your cardiac medications before your next appointment, please call your pharmacy*  Follow-Up: At [facility], you and your health needs are our priority.  As part of our continuing mission to provide you with exceptional heart care, we have created designated Provider Care Teams.  These Care Teams include your primary Cardiologist (physician) and Advanced Practice Providers (APPs -  Physician Assistants and Nurse Practitioners) who all work together to provide you with the care you need, when you need it.  We recommend signing up for the patient portal called "MyChart".  Sign up information is provided on this After Visit Summary.  MyChart is used to connect with patients for Virtual Visits (Telemedicine).  Patients are able to view lab/test results, encounter notes, upcoming appointments, etc.  Non-urgent messages can be sent to your provider as well.   To learn more about what you can do with MyChart, go to [email or web address].    Your next appointment:   6 month(s)  The format for your next appointment:   In Person  Provider:   James Hochrein, MD {    Important Information About Sugar       

## 2022-01-19 ENCOUNTER — Other Ambulatory Visit: Payer: Self-pay

## 2022-01-19 ENCOUNTER — Encounter: Payer: Self-pay | Admitting: Radiation Oncology

## 2022-01-19 ENCOUNTER — Ambulatory Visit
Admission: RE | Admit: 2022-01-19 | Discharge: 2022-01-19 | Disposition: A | Discharge status: Home / Self Care | Payer: Medicare Other | Source: Ambulatory Visit | Attending: Radiation Oncology | Admitting: Radiation Oncology

## 2022-01-19 DIAGNOSIS — Z510 Encounter for antineoplastic radiation therapy: Secondary | ICD-10-CM | POA: Diagnosis not present

## 2022-01-19 DIAGNOSIS — Z87891 Personal history of nicotine dependence: Secondary | ICD-10-CM | POA: Diagnosis not present

## 2022-01-19 DIAGNOSIS — C3412 Malignant neoplasm of upper lobe, left bronchus or lung: Secondary | ICD-10-CM | POA: Diagnosis not present

## 2022-01-19 LAB — RAD ONC ARIA SESSION SUMMARY
Course Elapsed Days: 10
Plan Fractions Treated to Date: 5
Plan Prescribed Dose Per Fraction: 12 Gy
Plan Total Fractions Prescribed: 5
Plan Total Prescribed Dose: 60 Gy
Reference Point Dosage Given to Date: 60 Gy
Reference Point Session Dosage Given: 12 Gy
Session Number: 5

## 2022-01-31 ENCOUNTER — Encounter: Payer: Self-pay | Admitting: Internal Medicine

## 2022-01-31 NOTE — Progress Notes (Signed)
                                                                                                                                                             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 He developed fatigue and continued to have a nonproductive cough during therapy which was present prior to treatment.  Plan: The patient will receive a call in about one month from the radiation oncology department. He will continue follow up with Dr. Julien Nordmann as well.   ________________________________________________    Carola Rhine, [facility]

## 2022-02-07 ENCOUNTER — Other Ambulatory Visit: Payer: Self-pay | Admitting: Cardiology

## 2022-02-15 ENCOUNTER — Other Ambulatory Visit: Payer: Medicare Other

## 2022-02-15 ENCOUNTER — Ambulatory Visit
Admission: RE | Admit: 2022-02-15 | Discharge: 2022-02-15 | Disposition: A | Discharge status: Home / Self Care | Payer: Medicare Other | Source: Ambulatory Visit | Attending: Physician Assistant | Admitting: Physician Assistant

## 2022-02-15 DIAGNOSIS — M545 Low back pain, unspecified: Secondary | ICD-10-CM

## 2022-02-15 DIAGNOSIS — M47816 Spondylosis without myelopathy or radiculopathy, lumbar region: Secondary | ICD-10-CM | POA: Diagnosis not present

## 2022-02-22 NOTE — Progress Notes (Signed)
  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 He developed fatigue and continued to have a nonproductive cough during therapy which was present prior to treatment.     Impression/Plan: 1. 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 The patient has been doing well since completion of radiotherapy. We discussed that we would be happy to continue to follow him as needed, but he will also continue to follow up with Dr. Julien Nordmann in medical oncology.      Carola Rhine, PAC

## 2022-02-26 ENCOUNTER — Ambulatory Visit
Admission: RE | Admit: 2022-02-26 | Discharge: 2022-02-26 | Disposition: A | Discharge status: Home / Self Care | Payer: Medicare Other | Source: Ambulatory Visit | Attending: Radiation Oncology | Admitting: Radiation Oncology

## 2022-02-26 DIAGNOSIS — C3491 Malignant neoplasm of unspecified part of right bronchus or lung: Secondary | ICD-10-CM

## 2022-03-05 DIAGNOSIS — M545 Low back pain, unspecified: Secondary | ICD-10-CM | POA: Diagnosis not present

## 2022-03-19 IMAGING — CT CT BIOPSY
1 of 3 series · 9 of 32 positions shown, 15 images · non-contrast
Comparison: none

CLINICAL DATA: Joint disorder. Left sacroiliitis. Minimal response
to physical therapy. Patient has a history of contrast reaction and
used a 13 hour prep.

[Series 3: needle -guided injection · axial · 0.80mm/px · z∈[-147,-41]mm · 9 of 67 slices shown, 15 images]
[im 7/67  soft-tissue]
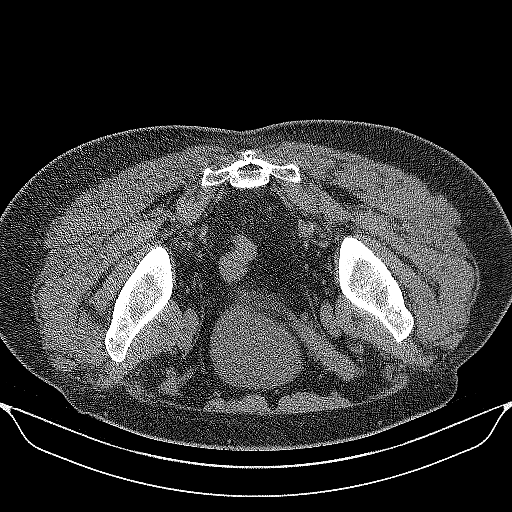
[im 7/67  bone]
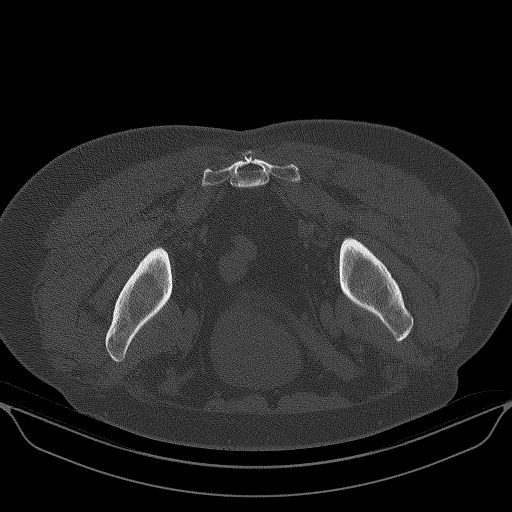
[im 14/67  soft-tissue]
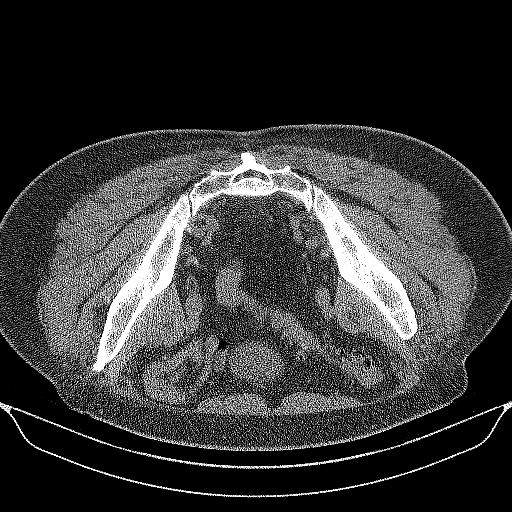
[im 20/67  soft-tissue]
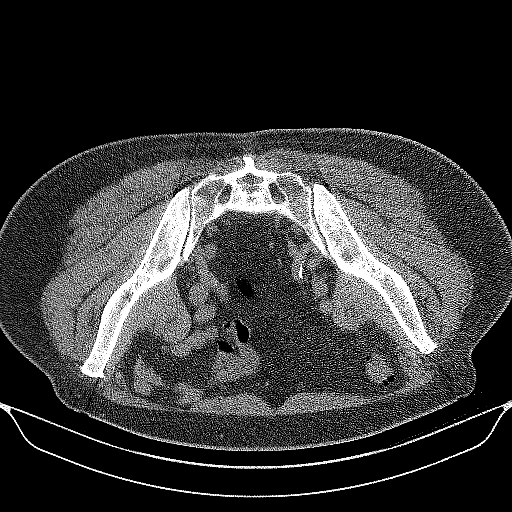
[im 27/67  soft-tissue]
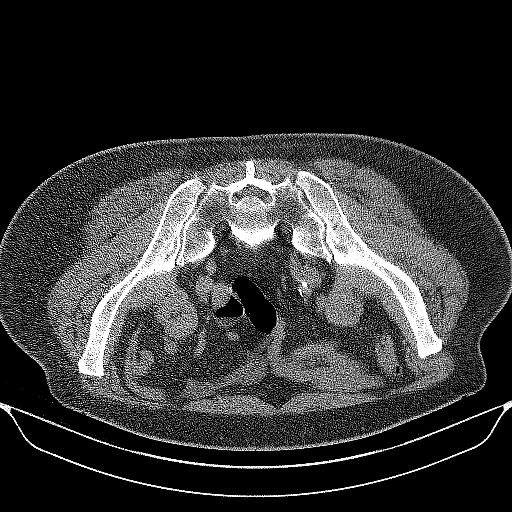
[im 34/67  soft-tissue]
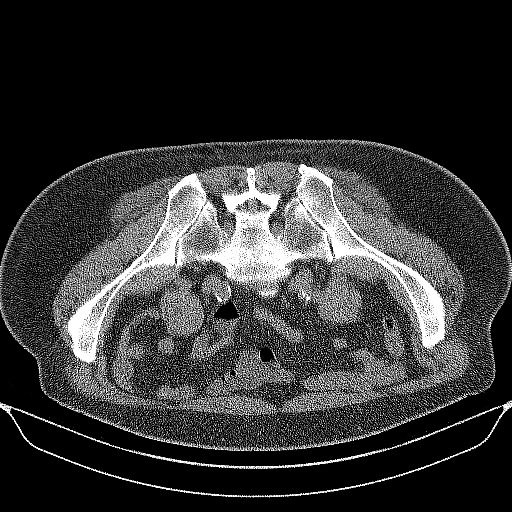
[im 40/67  soft-tissue]
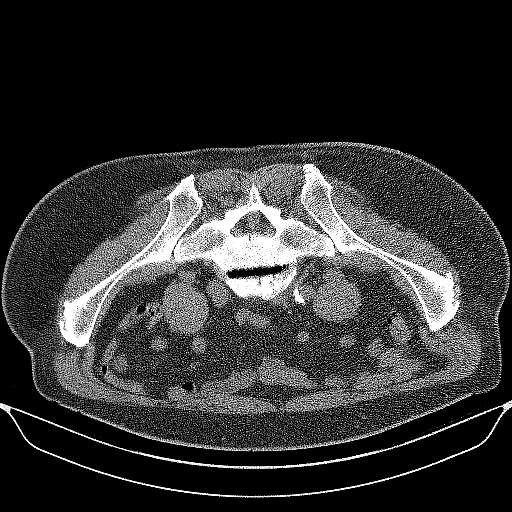
[im 40/67  lung]
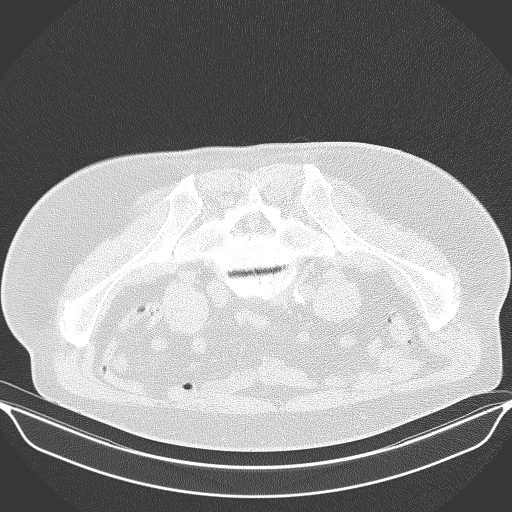
[im 47/67  soft-tissue]
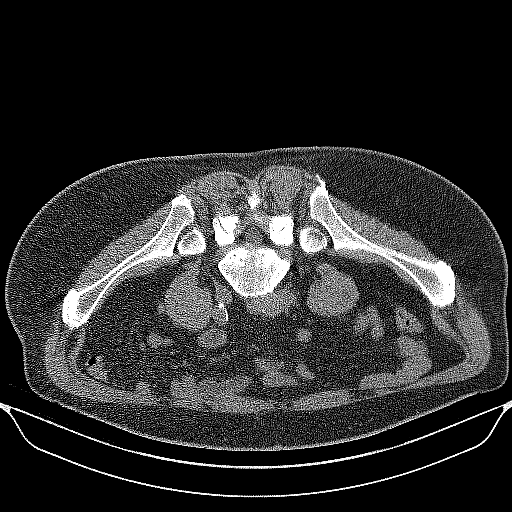
[im 47/67  lung]
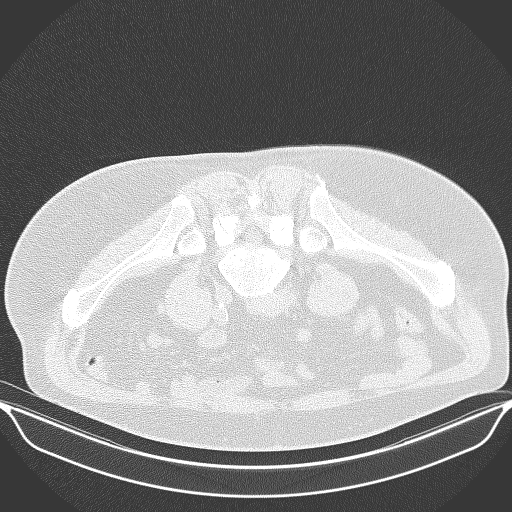
[im 53/67  soft-tissue]
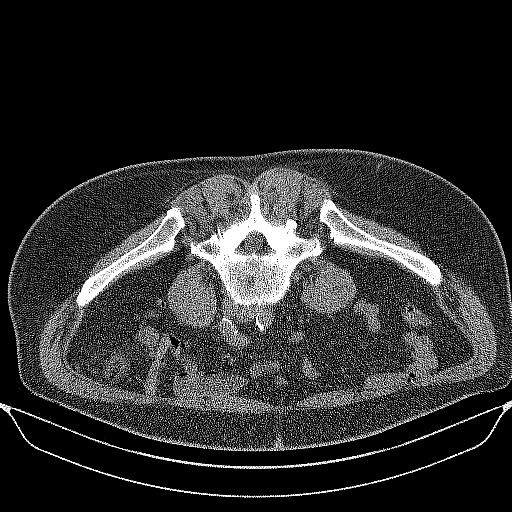
[im 53/67  lung]
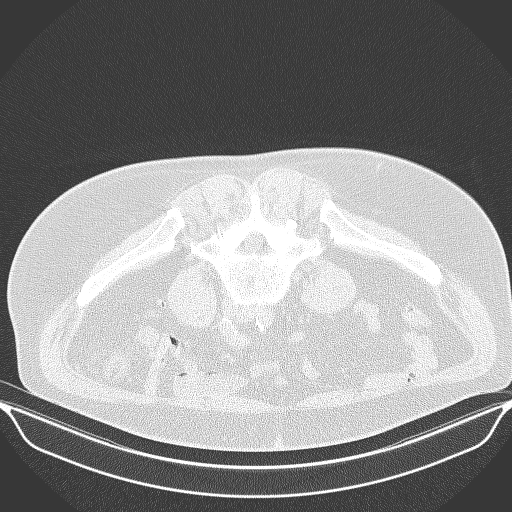
[im 60/67  soft-tissue]
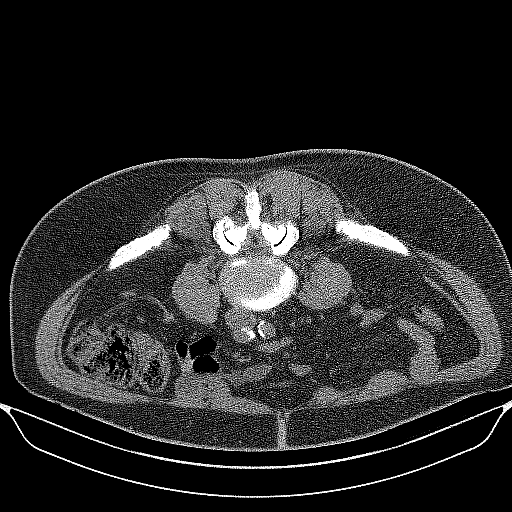
[im 60/67  lung]
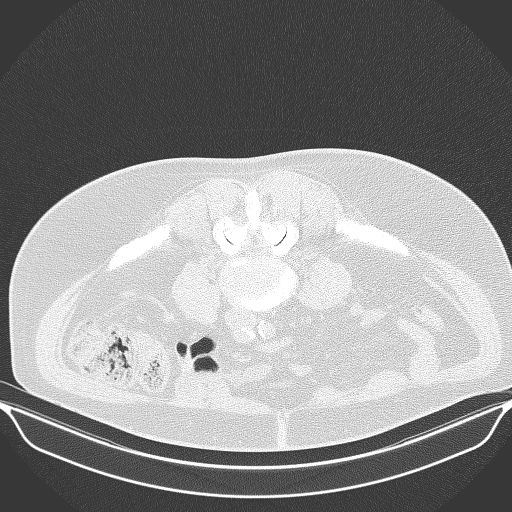
[im 60/67  bone]
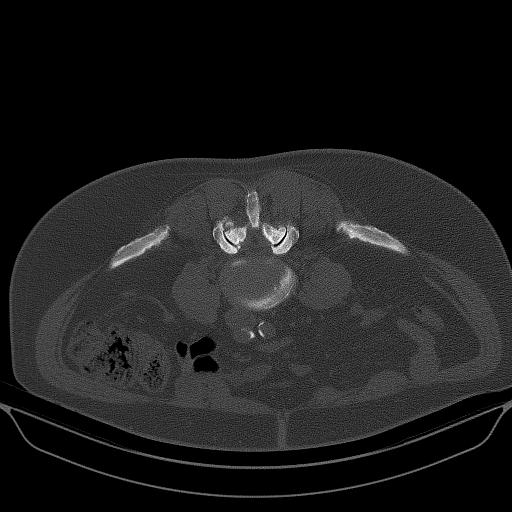

[9 of 32 positions shown; findings below may reference images not displayed]

EXAM:
Left CT GUIDED SI JOINT INJECTION



After local anesthesia with 1% lidocaine without epinephrine and
subsequent deep anesthesia, a 22 gauge spinal needle was advanced
into the left SI joint under intermittent CT guidance.

Once the needle was in satisfactory position, representative image
following injection of 1 mL Isovue M 200 was captured with the
needle demonstrated in the sacroiliac joint. Confirmed position
subsequently, 2 mL 0.5% bupivacaine was injected into the left SI
joint. Needles removed and a sterile dressing applied.

Patient reported cord pain during the injection. He has fairly
low-level pain prior to the injection, potentially secondary to the
steroids. There was no significant change to his pain after the
injection. We discussed logging his pain over the next 24-36 hours.

No complications were observed.
IMPRESSION: Successful CT-guided left anesthetic only SI joint injection.

## 2022-03-20 ENCOUNTER — Encounter: Payer: Self-pay | Admitting: Rehabilitative and Restorative Service Providers"

## 2022-03-20 ENCOUNTER — Ambulatory Visit: Payer: Medicare Other | Attending: Orthopedic Surgery | Admitting: Rehabilitative and Restorative Service Providers"

## 2022-03-20 ENCOUNTER — Other Ambulatory Visit: Payer: Self-pay

## 2022-03-20 DIAGNOSIS — M6281 Muscle weakness (generalized): Secondary | ICD-10-CM | POA: Insufficient documentation

## 2022-03-20 DIAGNOSIS — R252 Cramp and spasm: Secondary | ICD-10-CM

## 2022-03-20 DIAGNOSIS — M545 Low back pain, unspecified: Secondary | ICD-10-CM | POA: Diagnosis not present

## 2022-03-20 DIAGNOSIS — M5459 Other low back pain: Secondary | ICD-10-CM

## 2022-03-20 DIAGNOSIS — R2689 Other abnormalities of gait and mobility: Secondary | ICD-10-CM | POA: Insufficient documentation

## 2022-03-20 NOTE — Therapy (Signed)
OUTPATIENT PHYSICAL THERAPY THORACOLUMBAR EVALUATION   Patient Name: Travis Padilla MRN: [id number] DOB:07-Aug-1946, 76 y.o., male Today's Date: 03/20/2022   PT End of Session - 03/20/22 0941     Visit Number 1    Date for PT Re-Evaluation 05/11/22    Authorization Type Med A    PT Start Time 0930    PT Stop Time 1010    PT Time Calculation (min) 40 min    Activity Tolerance Patient tolerated treatment well    Behavior During Therapy [facility] for tasks assessed/performed             Past Medical History:  Diagnosis Date   Abnormal nuclear stress test    December, 2013   Anemia    Axillary adenopathy 04/24/2018   CAD (coronary artery disease)    Mild nonobstructive plaque in cath 2013   Chest pain    December, 2013   Cough with hemoptysis 04/22/2018   Dizziness    Dyslipidemia 07/14/2019   Encounter for antineoplastic immunotherapy 05/13/2018   Gout    Hemorrhoid    Hyperlipidemia    Hypertension    Hypothyroidism (acquired) 08/11/2018   Metastasis to lung ([place]) 05/19/2018   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) ([place]) dx'd 04/17/18   to LN, infrahilar mass, lung nodule and lt axilla   Obesity, unspecified 03/28/2009   Qualifier: Diagnosis of  By: [name], MD, [name]     Palpitations 03/28/2009   Qualifier: Diagnosis of  By: [name], MD, [name]     Right lower lobe lung mass 04/22/2018   Skin cancer    Sleep apnea    SNORING 03/28/2009   Qualifier: Diagnosis of  By: [name], MD, [name]     Spinal stenosis    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   Past Surgical History:  Procedure Laterality Date   basal skin cancer N/A 2019   Nose   BELPHAROPTOSIS REPAIR Bilateral    CARDIAC CATHETERIZATION     CATARACT EXTRACTION W/ INTRAOCULAR LENS  IMPLANT, BILATERAL     COLONOSCOPY N/A 11/03/2014   Procedure: COLONOSCOPY;  Surgeon: Rogene [place], MD;  [facility];  Service: Endoscopy;   Laterality: N/A;  1225   CORONARY ATHERECTOMY N/A 05/26/2021   Procedure: CORONARY ATHERECTOMY;  Surgeon: Early Osmond, MD;  Location: [facility] [facility] LAB;  Service: Cardiovascular;  Laterality: N/A;   CORONARY STENT INTERVENTION N/A 05/25/2021   Procedure: CORONARY STENT INTERVENTION;  Surgeon: Nelva Bush, MD;  Location: [place] CV LAB;  Service: Cardiovascular;  Laterality: N/A;   CORONARY STENT INTERVENTION N/A 05/26/2021   Procedure: CORONARY STENT INTERVENTION;  Surgeon: Early Osmond, MD;  Location: [place] CV LAB;  Service: Cardiovascular;  Laterality: N/A;   ESOPHAGOGASTRODUODENOSCOPY (EGD) WITH PROPOFOL N/A 05/29/2021   Procedure: ESOPHAGOGASTRODUODENOSCOPY (EGD) WITH PROPOFOL;  Surgeon: Gatha Mayer, MD;  Location: [facility];  Service: Endoscopy;  Laterality: N/A;   INTRAVASCULAR ULTRASOUND/IVUS N/A 05/26/2021   Procedure: Intravascular Ultrasound/IVUS;  Surgeon: Early Osmond, MD;  Location: [place] CV LAB;  Service: Cardiovascular;  Laterality: N/A;   IR CV LINE INJECTION  08/24/2020   IR FLUORO GUIDED NEEDLE PLC ASPIRATION/INJECTION LOC  11/28/2020   IR IMAGING GUIDED PORT INSERTION  07/11/2018   KNEE CARTILAGE SURGERY Left    Left knee   LEFT HEART CATH AND CORONARY ANGIOGRAPHY N/A 05/25/2021   Procedure: LEFT HEART CATH AND CORONARY ANGIOGRAPHY;  Surgeon: End,  Harrell Gave, MD;  Location: [place] CV LAB;  Service: Cardiovascular;  Laterality: N/A;   SKIN CANCER EXCISION  12/2014, 04/25/15   TEMPORARY PACEMAKER N/A 05/26/2021   Procedure: TEMPORARY PACEMAKER;  Surgeon: Early Osmond, MD;  Location: [place] CV LAB;  Service: Cardiovascular;  Laterality: N/A;   VIDEO BRONCHOSCOPY Bilateral 05/09/2018   Procedure: VIDEO BRONCHOSCOPY WITH FLUORO;  Surgeon: Juanito Doom, MD;  Location: [facility];  Service: Cardiopulmonary;  Laterality: Bilateral;   Patient Active Problem List   Diagnosis Date Noted   COPD (chronic obstructive pulmonary  disease) ([place]) 09/22/2021   Back pain 09/22/2021   Fever 06/29/2021   Acute blood loss anemia    Gastrointestinal hemorrhage associated with acute gastritis    Unstable angina ([place]) 05/25/2021   Coronary artery disease involving native coronary artery of native heart without angina pectoris 12/07/2020   SIRS (systemic inflammatory response syndrome) ([place]) 11/27/2020   Encephalitis    Thrombocytopenia ([facility])    Severe sepsis without septic shock ([place]) 11/26/2020   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   Itching 11/17/2019   Dyslipidemia 07/14/2019   Shoulder pain 03/31/2019   Port-A-Cath in place 08/11/2018   Hypothyroidism (acquired) 08/11/2018   Metastasis to lung ([place]) 05/19/2018   Encounter for antineoplastic chemotherapy 05/13/2018   Encounter for antineoplastic immunotherapy 05/13/2018   Goals of care, counseling/discussion 05/13/2018   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/2018   Axillary adenopathy 04/24/2018   Right lower lobe lung mass 04/22/2018   Cough with hemoptysis 04/22/2018   Wheezing 04/02/2018   Long-term use of high-risk medication 04/02/2018   Dizziness 06/26/2017   Fatigue 04/30/2016   Insomnia 04/30/2016   Sinusitis 01/31/2016   Hyperlipidemia 01/31/2016   Ear pain 05/09/2015   Nephrolithiasis 04/18/2015   Anemia    Abnormal nuclear stress test    OBESITY, UNSPECIFIED 03/28/2009   Essential hypertension, benign 03/28/2009   PALPITATIONS 03/28/2009   SNORING 03/28/2009    PCP: Francis Dowse, DO  REFERRING PROVIDER: Melina Schools, MD   REFERRING DIAG: M54.50 (ICD-10-CM) - Low back pain, unspecified   Rationale for Evaluation and Treatment Rehabilitation  THERAPY DIAG:  Other low back pain - Plan: PT plan of care cert/re-cert  Muscle weakness (generalized) - Plan: PT plan of care cert/re-cert  Cramp and spasm - Plan: PT plan of care cert/re-cert  Other abnormalities of  gait and mobility - Plan: PT plan of care cert/re-cert  ONSET DATE: 49/82/6415 received referral, but pt reports that he has had this pain for extensive period  SUBJECTIVE:  SUBJECTIVE STATEMENT: Pt reports that he has had back pain for at least 9 months.  Has tried massage, exercise, and PT local to him at [place], [place].  However, Dr Rolena Infante referred to this clinic to try other services such as aquatics and dry needling.  PERTINENT HISTORY:  Metastatic lung cancer, CAD, gout, spinal stenosis  PAIN:  Are you having pain? Yes: NPRS scale: 4-6/10 Pain location: low back and lats Pain description: aching lats, lower back "just a chronic pain" Aggravating factors: activity Relieving factors: cold pack and Tylenol   PRECAUTIONS: None  WEIGHT BEARING RESTRICTIONS No  FALLS:  Has patient fallen in last 6 months? No  LIVING ENVIRONMENT: Lives with: lives alone (wife passed away in 2022/10/17) Lives in: House/apartment Stairs: Yes: Internal: 15 steps; can reach both and External: 4 steps; bilateral but cannot reach both Has following equipment at home: Single point cane  OCCUPATION: retired Chief Financial Officer  PLOF: Independent and Leisure: going to [employer], yoga classes, outdoor activities  PATIENT GOALS:  To decrease pain to be able to travel again   OBJECTIVE:   DIAGNOSTIC FINDINGS:  Lumbar MRI on 02/15/2022:  Findings: Disc spaces: Degenerative disease with disc height loss at T10-11, T11-12 and L5-S1. Disc desiccation throughout the remainder of the lumbar spine. T12-L1: No significant disc bulge. No neural foraminal stenosis. No central canal stenosis.  L1-L2: No significant disc bulge. No neural foraminal stenosis. No central canal stenosis. Mild bilateral facet arthropathy.  L2-L3: Mild broad-based disc  bulge. Mild bilateral facet arthropathy. No foraminal stenosis. Mild spinal stenosis.  L3-L4: Mild broad-based disc bulge. Mild bilateral facet arthropathy. No foraminal or central canal stenosis.  L4-L5: A mild broad-based disc bulge. Moderate bilateral facet arthropathy with ligamentum flavum infolding. Minimal spinal stenosis. No significant foraminal stenosis.  L5-S1: Mild broad-based disc bulge. Mild bilateral facet arthropathy. No foraminal or central canal stenosis.   IMPRESSION: 1. Mild lumbar spine spondylosis as described above.  PATIENT SURVEYS:  03/20/2022:  FOTO 53% (projected 58% by visit 11) Modified Oswestry Low Back Pain Disability Questionnaire: 15 / 50 = 30.0 %  SCREENING FOR RED FLAGS: Bowel or bladder incontinence: No Spinal tumors: No Cauda equina syndrome: No Compression fracture: No Abdominal aneurysm: No  COGNITION:  Overall cognitive status: Within functional limits for tasks assessed     SENSATION: WFL  POSTURE: rounded shoulders and forward head  PALPATION: No tenderness to palpation noted  LUMBAR ROM:    Limited at least 25% throughout with some increased pain   LOWER EXTREMITY MMT:    MMT Right eval Left eval  Hip flexion    Hip extension 4 4  Hip abduction 4 4  Hip adduction    Hip internal rotation    Hip external rotation    Knee flexion    Knee extension    Ankle dorsiflexion    Ankle plantarflexion    Ankle inversion    Ankle eversion     (Blank rows = not tested)  FUNCTIONAL TESTS:  03/20/2022: 5 times sit to stand: 13.4 sec without UE use Timed up and go (TUG): 8.4 sec without assistive device  GAIT: Distance walked: 100 ft Assistive device utilized: None Level of assistance: Complete Independence Comments: antalgic gait pattern noted    TODAY'S TREATMENT  03/20/2022:  Reviewed HEP   PATIENT EDUCATION:  Education details: Issued HEP Person educated: Patient Education method: Explanation Education  comprehension: verbalized understanding and returned demonstration   HOME EXERCISE PROGRAM: Access Code: [email or web address] URL: [email or web address] Date: 03/20/2022  Prepared by: Juel Burrow  Exercises - Supine Lower Trunk Rotation  - 1 x daily - 7 x weekly - 1 sets - 5 reps - 10 sec hold - Supine Piriformis Stretch with Foot on Ground  - 1 x daily - 7 x weekly - 1 sets - 2 reps - 20 sec hold - Standing Hamstring Stretch with Step  - 1 x daily - 7 x weekly - 1 sets - 2 reps - 20 sec hold - Hip Flexor Stretch on Step  - 1 x daily - 7 x weekly - 1 sets - 2 reps - 20 sec hold - Cat Cow  - 1 x daily - 7 x weekly - 2 sets - 10 reps - Sidelying Open Book Thoracic Lumbar Rotation and Extension  - 1 x daily - 7 x weekly - 1 sets - 10 reps  ASSESSMENT:  CLINICAL IMPRESSION: Patient is a 76 y.o. male who was seen today for physical therapy evaluation and treatment for low back pain. Pt has tried PT before with his back, but he has been having pain and Dr Rolena Infante wanted pt to be able to try aquatics and dry needling.  Pt reports that he was having low back pain, but most recently has been having lat pain.  Pt presents with increased pain, muscle weakness, and difficulty with functional tasks.  Pt would like to be able to have decreased pain to allow him to start traveling again to be able to visit family in [place].  Pt would benefit from skilled PT to address his functional impairments to allow him to be safer and more independent with functional tasks.   OBJECTIVE IMPAIRMENTS difficulty walking, decreased strength, increased muscle spasms, impaired flexibility, postural dysfunction, and pain.   ACTIVITY LIMITATIONS lifting  PARTICIPATION LIMITATIONS: community activity  PERSONAL FACTORS Age, Time since onset of injury/illness/exacerbation, and 3+ comorbidities: metastatic lung cancer, CAD, spinal stenosis  are also affecting patient's functional outcome.   REHAB POTENTIAL:  Good  CLINICAL DECISION MAKING: Evolving/moderate complexity  EVALUATION COMPLEXITY: Moderate   GOALS: Goals reviewed with patient? Yes  SHORT TERM GOALS: Target date: 04/10/2022  Pt will be independent with initial HEP. Baseline: Goal status: INITIAL  2.  Pt will report pain no greater than 4/10 during daily routine and exercise. Baseline:  Goal status: INITIAL   LONG TERM GOALS: Target date: 05/15/2022  Pt will be independent with advanced HEP. Baseline:  Goal status: INITIAL  2.  Pt will increase lumbar FOTO to at least 58% to demonstrate improvements in functional mobility. Baseline: 53% Goal status: INITIAL  3.  Pt will increase bilateral hip strength to at least 4+ to 5-/5 to allow him to more easily perform functional tasks. Baseline:  Goal status: INITIAL  4.  Pt will report pain no greater than 2/10 during typical daily routine and activities. Baseline:  Goal status: INITIAL   PLAN: PT FREQUENCY: 2x/week  PT DURATION: 8 weeks  PLANNED INTERVENTIONS: Therapeutic exercises, Therapeutic activity, Neuromuscular re-education, Balance training, Gait training, Patient/Family education, Joint manipulation, Joint mobilization, Stair training, Aquatic Therapy, Dry Needling, Electrical stimulation, Spinal manipulation, Spinal mobilization, Cryotherapy, Moist heat, Taping, Traction, Ultrasound, Ionotophoresis 4mg /ml Dexamethasone, Manual therapy, and Re-evaluation.  PLAN FOR NEXT SESSION: assess and progress HEP as indicated, core stability, strengthening, flexibility   Juel Burrow, PT 03/20/2022, 3:33 PM   [facility] [address] [place], [place] [address] Phone # [phone] Fax [phone]

## 2022-03-26 ENCOUNTER — Encounter: Payer: Self-pay | Admitting: Rehabilitative and Restorative Service Providers"

## 2022-03-26 ENCOUNTER — Ambulatory Visit: Payer: Medicare Other | Admitting: Rehabilitative and Restorative Service Providers"

## 2022-03-26 DIAGNOSIS — M5459 Other low back pain: Secondary | ICD-10-CM

## 2022-03-26 DIAGNOSIS — R2689 Other abnormalities of gait and mobility: Secondary | ICD-10-CM

## 2022-03-26 DIAGNOSIS — M6281 Muscle weakness (generalized): Secondary | ICD-10-CM

## 2022-03-26 DIAGNOSIS — M545 Low back pain, unspecified: Secondary | ICD-10-CM | POA: Diagnosis not present

## 2022-03-26 DIAGNOSIS — R252 Cramp and spasm: Secondary | ICD-10-CM

## 2022-03-26 NOTE — Therapy (Signed)
OUTPATIENT PHYSICAL THERAPY THORACOLUMBAR EVALUATION   Patient Name: Travis Padilla MRN: [id number] DOB:April 19, 1946, 76 y.o., male Today's Date: 03/26/2022   PT End of Session - 03/26/22 0734     Visit Number 2    Date for PT Re-Evaluation 05/11/22    Authorization Type Med A    PT Start Time 0730    PT Stop Time 0800    PT Time Calculation (min) 30 min    Activity Tolerance Patient tolerated treatment well    Behavior During Therapy [facility] for tasks assessed/performed             Past Medical History:  Diagnosis Date   Abnormal nuclear stress test    December, 2013   Anemia    Axillary adenopathy 04/24/2018   CAD (coronary artery disease)    Mild nonobstructive plaque in cath 2013   Chest pain    December, 2013   Cough with hemoptysis 04/22/2018   Dizziness    Dyslipidemia 07/14/2019   Encounter for antineoplastic immunotherapy 05/13/2018   Gout    Hemorrhoid    Hyperlipidemia    Hypertension    Hypothyroidism (acquired) 08/11/2018   Metastasis to lung ([place]) 05/19/2018   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) ([place]) dx'd 04/17/18   to LN, infrahilar mass, lung nodule and lt axilla   Obesity, unspecified 03/28/2009   Qualifier: Diagnosis of  By: [name], MD, [name]     Palpitations 03/28/2009   Qualifier: Diagnosis of  By: [name], MD, [name]     Right lower lobe lung mass 04/22/2018   Skin cancer    Sleep apnea    SNORING 03/28/2009   Qualifier: Diagnosis of  By: [name], MD, [name]     Spinal stenosis    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   Past Surgical History:  Procedure Laterality Date   basal skin cancer N/A 2019   Nose   BELPHAROPTOSIS REPAIR Bilateral    CARDIAC CATHETERIZATION     CATARACT EXTRACTION W/ INTRAOCULAR LENS  IMPLANT, BILATERAL     COLONOSCOPY N/A 11/03/2014   Procedure: COLONOSCOPY;  Surgeon: Rogene Houston, MD;  [facility];  Service: Endoscopy;   Laterality: N/A;  1225   CORONARY ATHERECTOMY N/A 05/26/2021   Procedure: CORONARY ATHERECTOMY;  Surgeon: Early Osmond, MD;  Location: [place] [facility];  Service: Cardiovascular;  Laterality: N/A;   CORONARY STENT INTERVENTION N/A 05/25/2021   Procedure: CORONARY STENT INTERVENTION;  Surgeon: Nelva Bush, MD;  Location: [place] [facility];  Service: Cardiovascular;  Laterality: N/A;   CORONARY STENT INTERVENTION N/A 05/26/2021   Procedure: CORONARY STENT INTERVENTION;  Surgeon: Early Osmond, MD;  Location: [place] [facility];  Service: Cardiovascular;  Laterality: N/A;   ESOPHAGOGASTRODUODENOSCOPY (EGD) WITH PROPOFOL N/A 05/29/2021   Procedure: ESOPHAGOGASTRODUODENOSCOPY (EGD) WITH PROPOFOL;  Surgeon: Gatha Mayer, MD;  Location: [place];  Service: Endoscopy;  Laterality: N/A;   INTRAVASCULAR ULTRASOUND/IVUS N/A 05/26/2021   Procedure: Intravascular Ultrasound/IVUS;  Surgeon: Early Osmond, MD;  Location: [place] [facility];  Service: Cardiovascular;  Laterality: N/A;   IR CV LINE INJECTION  08/24/2020   IR FLUORO GUIDED NEEDLE PLC ASPIRATION/INJECTION LOC  11/28/2020   IR IMAGING GUIDED PORT INSERTION  07/11/2018   KNEE CARTILAGE SURGERY Left    Left knee   LEFT HEART CATH AND CORONARY ANGIOGRAPHY N/A 05/25/2021   Procedure: LEFT HEART CATH AND CORONARY ANGIOGRAPHY;  Surgeon: End,  Harrell Gave, MD;  Location: [place] [facility];  Service: Cardiovascular;  Laterality: N/A;   SKIN CANCER EXCISION  12/2014, 04/25/15   TEMPORARY PACEMAKER N/A 05/26/2021   Procedure: TEMPORARY PACEMAKER;  Surgeon: Early Osmond, MD;  Location: [place] [facility];  Service: Cardiovascular;  Laterality: N/A;   VIDEO BRONCHOSCOPY Bilateral 05/09/2018   Procedure: VIDEO BRONCHOSCOPY WITH FLUORO;  Surgeon: Juanito Doom, MD;  Location: [facility];  Service: Cardiopulmonary;  Laterality: Bilateral;   Patient Active Problem List   Diagnosis Date Noted   COPD (chronic obstructive pulmonary  disease) ([place]) 09/22/2021   Back pain 09/22/2021   Fever 06/29/2021   Acute blood loss anemia    Gastrointestinal hemorrhage associated with acute gastritis    Unstable angina ([place]) 05/25/2021   Coronary artery disease involving native coronary artery of native heart without angina pectoris 12/07/2020   SIRS (systemic inflammatory response syndrome) ([place]) 11/27/2020   Encephalitis    Thrombocytopenia ([facility])    Severe sepsis without septic shock ([place]) 11/26/2020   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   Itching 11/17/2019   Dyslipidemia 07/14/2019   Shoulder pain 03/31/2019   Port-A-Cath in place 08/11/2018   Hypothyroidism (acquired) 08/11/2018   Metastasis to lung ([place]) 05/19/2018   Encounter for antineoplastic chemotherapy 05/13/2018   Encounter for antineoplastic immunotherapy 05/13/2018   Goals of care, counseling/discussion 05/13/2018   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[place]) 05/13/2018   Axillary adenopathy 04/24/2018   Right lower lobe lung mass 04/22/2018   Cough with hemoptysis 04/22/2018   Wheezing 04/02/2018   Long-term use of high-risk medication 04/02/2018   Dizziness 06/26/2017   Fatigue 04/30/2016   Insomnia 04/30/2016   Sinusitis 01/31/2016   Hyperlipidemia 01/31/2016   Ear pain 05/09/2015   Nephrolithiasis 04/18/2015   Anemia    Abnormal nuclear stress test    OBESITY, UNSPECIFIED 03/28/2009   Essential hypertension, benign 03/28/2009   PALPITATIONS 03/28/2009   SNORING 03/28/2009    PCP: Francis Dowse, DO  REFERRING PROVIDER: Melina Schools, MD   REFERRING DIAG: M54.50 (ICD-10-CM) - Low back pain, unspecified   Rationale for Evaluation and Treatment Rehabilitation  THERAPY DIAG:  Other low back pain  Muscle weakness (generalized)  Cramp and spasm  Other abnormalities of gait and mobility  ONSET DATE: 03/06/2022 received referral, but pt reports that he has had this pain for  extensive period  SUBJECTIVE:  SUBJECTIVE STATEMENT: Pt reports that a lot of the HEP had been incorporated into his yoga classes.  PERTINENT HISTORY:  Metastatic lung cancer, CAD, gout, spinal stenosis  PAIN:  Are you having pain? Yes: NPRS scale: 5/10 Pain location: low back and lats Pain description: aching lats, lower back "just a chronic pain" Aggravating factors: activity Relieving factors: cold pack and Tylenol   PRECAUTIONS: None  OCCUPATION: retired Chief Financial Officer  PLOF: Independent and Leisure: going to [employer], yoga classes, outdoor activities  PATIENT GOALS:  To decrease pain to be able to travel again   OBJECTIVE:   DIAGNOSTIC FINDINGS:  Lumbar MRI on 02/15/2022:  Findings: Disc spaces: Degenerative disease with disc height loss at T10-11, T11-12 and L5-S1. Disc desiccation throughout the remainder of the lumbar spine. T12-L1: No significant disc bulge. No neural foraminal stenosis. No central canal stenosis.  L1-L2: No significant disc bulge. No neural foraminal stenosis. No central canal stenosis. Mild bilateral facet arthropathy.  L2-L3: Mild broad-based disc bulge. Mild bilateral facet arthropathy. No foraminal stenosis. Mild spinal stenosis.  L3-L4: Mild broad-based disc bulge. Mild bilateral facet arthropathy. No foraminal or central canal stenosis.  L4-L5: A mild broad-based disc bulge. Moderate bilateral facet arthropathy with ligamentum flavum infolding. Minimal spinal stenosis. No significant foraminal stenosis.  L5-S1: Mild broad-based disc bulge. Mild bilateral facet arthropathy. No foraminal or central canal stenosis.   IMPRESSION: 1. Mild lumbar spine spondylosis as described above.  PATIENT SURVEYS:  03/20/2022:  FOTO 53% (projected 58% by visit 11) Modified Oswestry Low  Back Pain Disability Questionnaire: 15 / 50 = 30.0 %  SCREENING FOR RED FLAGS: Bowel or bladder incontinence: No Spinal tumors: No Cauda equina syndrome: No Compression fracture: No Abdominal aneurysm: No  COGNITION:  Overall cognitive status: Within functional limits for tasks assessed     SENSATION: WFL  POSTURE: rounded shoulders and forward head  PALPATION: No tenderness to palpation noted  LUMBAR ROM:    Limited at least 25% throughout with some increased pain   LOWER EXTREMITY MMT:    MMT Right eval Left eval  Hip flexion    Hip extension 4 4  Hip abduction 4 4  Hip adduction    Hip internal rotation    Hip external rotation    Knee flexion    Knee extension    Ankle dorsiflexion    Ankle plantarflexion    Ankle inversion    Ankle eversion     (Blank rows = not tested)  FUNCTIONAL TESTS:  03/20/2022: 5 times sit to stand: 13.4 sec without UE use Timed up and go (TUG): 8.4 sec without assistive device  GAIT: Distance walked: 100 ft Assistive device utilized: None Level of assistance: Complete Independence Comments: antalgic gait pattern noted    TODAY'S TREATMENT: 03/26/22: Nustep level 5 x5 minutes with PT present to discuss status Seated 3 way green pball rollout 5x10 sec each Sidelying open book stretch x5 bilat Trigger Point Dry-Needling  Treatment instructions: Expect mild to moderate muscle soreness. S/S of pneumothorax if dry needled over a lung field, and to seek immediate medical attention should they occur. Patient verbalized understanding of these instructions and education. Patient Consent Given: Yes Education handout provided: Yes Muscles treated: bilateral lumbar multifidi, bilateral rhomboids, bilateral lats Electrical stimulation performed: No Parameters: N/A Treatment response/outcome: Utilized skilled palpation to identify trigger points and tight musculature.  Palpable twitch response and muscle elongation noted.      03/20/2022:  Reviewed HEP   PATIENT EDUCATION:  Education details:  Issued HEP Person educated: Patient Education method: Explanation Education comprehension: verbalized understanding and returned demonstration   HOME EXERCISE PROGRAM: Access Code: [email or web address] URL: [email or web address] Date: 03/20/2022 Prepared by: Shelby Dubin Andrez Lieurance  Exercises - Supine Lower Trunk Rotation  - 1 x daily - 7 x weekly - 1 sets - 5 reps - 10 sec hold - Supine Piriformis Stretch with Foot on Ground  - 1 x daily - 7 x weekly - 1 sets - 2 reps - 20 sec hold - Standing Hamstring Stretch with Step  - 1 x daily - 7 x weekly - 1 sets - 2 reps - 20 sec hold - Hip Flexor Stretch on Step  - 1 x daily - 7 x weekly - 1 sets - 2 reps - 20 sec hold - Cat Cow  - 1 x daily - 7 x weekly - 2 sets - 10 reps - Sidelying Open Book Thoracic Lumbar Rotation and Extension  - 1 x daily - 7 x weekly - 1 sets - 10 reps  ASSESSMENT:  CLINICAL IMPRESSION: Mr Errico presents to skilled PT reporting compliance with HEP and stating that he continues to perform his yoga.  Pt agreeable to trying dry needling to address his pain.  Pt with noted twitch response with right greater than left side.  Pt provided with handout educating about dry needling and will assess his progress with treatment next session.  Pt continues to require skilled PT to progress towards goal related activities.   OBJECTIVE IMPAIRMENTS difficulty walking, decreased strength, increased muscle spasms, impaired flexibility, postural dysfunction, and pain.   ACTIVITY LIMITATIONS lifting  PARTICIPATION LIMITATIONS: community activity  PERSONAL FACTORS Age, Time since onset of injury/illness/exacerbation, and 3+ comorbidities: metastatic lung cancer, CAD, spinal stenosis  are also affecting patient's functional outcome.   REHAB POTENTIAL: Good  CLINICAL DECISION MAKING: Evolving/moderate complexity  EVALUATION COMPLEXITY: Moderate   GOALS: Goals  reviewed with patient? Yes  SHORT TERM GOALS: Target date: 04/10/2022  Pt will be independent with initial HEP. Baseline: Goal status: INITIAL  2.  Pt will report pain no greater than 4/10 during daily routine and exercise. Baseline:  Goal status: INITIAL   LONG TERM GOALS: Target date: 05/15/2022  Pt will be independent with advanced HEP. Baseline:  Goal status: INITIAL  2.  Pt will increase lumbar FOTO to at least 58% to demonstrate improvements in functional mobility. Baseline: 53% Goal status: INITIAL  3.  Pt will increase bilateral hip strength to at least 4+ to 5-/5 to allow him to more easily perform functional tasks. Baseline:  Goal status: INITIAL  4.  Pt will report pain no greater than 2/10 during typical daily routine and activities. Baseline:  Goal status: INITIAL   PLAN: PT FREQUENCY: 2x/week  PT DURATION: 8 weeks  PLANNED INTERVENTIONS: Therapeutic exercises, Therapeutic activity, Neuromuscular re-education, Balance training, Gait training, Patient/Family education, Joint manipulation, Joint mobilization, Stair training, Aquatic Therapy, Dry Needling, Electrical stimulation, Spinal manipulation, Spinal mobilization, Cryotherapy, Moist heat, Taping, Traction, Ultrasound, Ionotophoresis 4mg /ml Dexamethasone, Manual therapy, and Re-evaluation.  PLAN FOR NEXT SESSION: assess and progress HEP as indicated, core stability, strengthening, flexibility   Juel Burrow, PT, DPT 03/26/2022, 8:22 AM   [facility] [address], [place], [place] [address] Phone # [phone] Fax [phone]

## 2022-03-26 NOTE — Patient Instructions (Signed)
Trigger Point Dry Needling ? What is Trigger Point Dry Needling (DN)? o DN is a physical therapy technique used to treat muscle pain and dysfunction.  Specifically, DN helps deactivate muscle trigger points (muscle knots).  o A thin filiform needle is used to penetrate the skin and stimulate the underlying  trigger point. The goal is for a local twitch response (LTR) to occur and for the  trigger point to relax. No medication of any kind is injected during the procedure.  ? What Does Trigger Point Dry Needling Feel Like?  o The procedure feels different for each individual patient. Some patients report  that they do not actually feel the needle enter the skin and overall the process is  not painful. Very mild bleeding may occur. However, many patients feel a deep  cramping in the muscle in which the needle was inserted. This is the local twitch  response.  ? How Will I feel after the treatment? o Soreness is normal, and the onset of soreness may not occur for a few hours.  Typically this soreness does not last longer than two days.  o Bruising is uncommon, however; ice can be used to decrease any possible  bruising.  o In rare cases feeling tired or nauseous after the treatment is normal. In addition,  your symptoms may get worse before they get better, this period will typically not  last longer than 24 hours.  ? What Can I do After My Treatment? o Increase your hydration by drinking more water for the next 24 hours. o You may place ice or heat on the areas treated that have become sore, however,  do not use heat on inflamed or bruised areas. Heat often brings more relief post  needling. o You can continue your regular activities, but vigorous activity is not  recommended initially after the treatment for 24 hours. DN is best combined with other physical therapy such as strengthening, stretching, and other  therapies.  [facility] [address] [place], [place] [address] Phone # [phone] Fax [phone]

## 2022-03-31 ENCOUNTER — Other Ambulatory Visit: Payer: Self-pay | Admitting: Cardiology

## 2022-04-02 ENCOUNTER — Ambulatory Visit: Payer: Medicare Other | Admitting: Physical Therapy

## 2022-04-02 ENCOUNTER — Other Ambulatory Visit: Payer: Self-pay

## 2022-04-02 ENCOUNTER — Encounter: Payer: Self-pay | Admitting: Physical Therapy

## 2022-04-02 DIAGNOSIS — R2689 Other abnormalities of gait and mobility: Secondary | ICD-10-CM

## 2022-04-02 DIAGNOSIS — M6281 Muscle weakness (generalized): Secondary | ICD-10-CM

## 2022-04-02 DIAGNOSIS — M5459 Other low back pain: Secondary | ICD-10-CM

## 2022-04-02 DIAGNOSIS — R252 Cramp and spasm: Secondary | ICD-10-CM

## 2022-04-02 DIAGNOSIS — M545 Low back pain, unspecified: Secondary | ICD-10-CM | POA: Diagnosis not present

## 2022-04-02 NOTE — Therapy (Signed)
OUTPATIENT PHYSICAL THERAPY THORACOLUMBAR EVALUATION   Patient Name: Travis Padilla MRN: [id number] DOB:10/07/1945, 76 y.o., male Today's Date: 04/02/2022   PT End of Session - 04/02/22 0753     Visit Number 3    Date for PT Re-Evaluation 05/11/22    Authorization Type Med A    PT Start Time 8502    PT Stop Time 0843    PT Time Calculation (min) 50 min    Activity Tolerance Patient tolerated treatment well    Behavior During Therapy [facility] for tasks assessed/performed             Past Medical History:  Diagnosis Date   Abnormal nuclear stress test    December, 2013   Anemia    Axillary adenopathy 04/24/2018   CAD (coronary artery disease)    Mild nonobstructive plaque in cath 2013   Chest pain    December, 2013   Cough with hemoptysis 04/22/2018   Dizziness    Dyslipidemia 07/14/2019   Encounter for antineoplastic immunotherapy 05/13/2018   Gout    Hemorrhoid    Hyperlipidemia    Hypertension    Hypothyroidism (acquired) 08/11/2018   Metastasis to lung ([place]) 05/19/2018   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) ([place]) dx'd 04/17/18   to LN, infrahilar mass, lung nodule and lt axilla   Obesity, unspecified 03/28/2009   Qualifier: Diagnosis of  By: [name], MD, [name]     Palpitations 03/28/2009   Qualifier: Diagnosis of  By: [name], MD, [name]     Right lower lobe lung mass 04/22/2018   Skin cancer    Sleep apnea    SNORING 03/28/2009   Qualifier: Diagnosis of  By: [name], MD, [name]     Spinal stenosis    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   Past Surgical History:  Procedure Laterality Date   basal skin cancer N/A 2019   Nose   BELPHAROPTOSIS REPAIR Bilateral    CARDIAC CATHETERIZATION     CATARACT EXTRACTION W/ INTRAOCULAR LENS  IMPLANT, BILATERAL     COLONOSCOPY N/A 11/03/2014   Procedure: COLONOSCOPY;  Surgeon: Rogene Houston, MD;  [facility];  Service: Endoscopy;   Laterality: N/A;  1225   CORONARY ATHERECTOMY N/A 05/26/2021   Procedure: CORONARY ATHERECTOMY;  Surgeon: Early Osmond, MD;  Location: [place] [facility] LAB;  Service: Cardiovascular;  Laterality: N/A;   CORONARY STENT INTERVENTION N/A 05/25/2021   Procedure: CORONARY STENT INTERVENTION;  Surgeon: Nelva Bush, MD;  Location: [place] CV LAB;  Service: Cardiovascular;  Laterality: N/A;   CORONARY STENT INTERVENTION N/A 05/26/2021   Procedure: CORONARY STENT INTERVENTION;  Surgeon: Early Osmond, MD;  Location: [place] CV LAB;  Service: Cardiovascular;  Laterality: N/A;   ESOPHAGOGASTRODUODENOSCOPY (EGD) WITH PROPOFOL N/A 05/29/2021   Procedure: ESOPHAGOGASTRODUODENOSCOPY (EGD) WITH PROPOFOL;  Surgeon: Gatha Mayer, MD;  Location: [place];  Service: Endoscopy;  Laterality: N/A;   INTRAVASCULAR ULTRASOUND/IVUS N/A 05/26/2021   Procedure: Intravascular Ultrasound/IVUS;  Surgeon: Early Osmond, MD;  Location: [place] CV LAB;  Service: Cardiovascular;  Laterality: N/A;   IR CV LINE INJECTION  08/24/2020   IR FLUORO GUIDED NEEDLE PLC ASPIRATION/INJECTION LOC  11/28/2020   IR IMAGING GUIDED PORT INSERTION  07/11/2018   KNEE CARTILAGE SURGERY Left    Left knee   LEFT HEART CATH AND CORONARY ANGIOGRAPHY N/A 05/25/2021   Procedure: LEFT HEART CATH AND CORONARY ANGIOGRAPHY;  Surgeon: End,  Harrell Gave, MD;  Location: [place] CV LAB;  Service: Cardiovascular;  Laterality: N/A;   SKIN CANCER EXCISION  12/2014, 04/25/15   TEMPORARY PACEMAKER N/A 05/26/2021   Procedure: TEMPORARY PACEMAKER;  Surgeon: Early Osmond, MD;  Location: [place] CV LAB;  Service: Cardiovascular;  Laterality: N/A;   VIDEO BRONCHOSCOPY Bilateral 05/09/2018   Procedure: VIDEO BRONCHOSCOPY WITH FLUORO;  Surgeon: Juanito Doom, MD;  Location: [facility];  Service: Cardiopulmonary;  Laterality: Bilateral;   Patient Active Problem List   Diagnosis Date Noted   COPD (chronic obstructive pulmonary  disease) ([place]) 09/22/2021   Back pain 09/22/2021   Fever 06/29/2021   Acute blood loss anemia    Gastrointestinal hemorrhage associated with acute gastritis    Unstable angina ([place]) 05/25/2021   Coronary artery disease involving native coronary artery of native heart without angina pectoris 12/07/2020   SIRS (systemic inflammatory response syndrome) ([place]) 11/27/2020   Encephalitis    Thrombocytopenia ([facility])    Severe sepsis without septic shock ([place]) 11/26/2020   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   Itching 11/17/2019   Dyslipidemia 07/14/2019   Shoulder pain 03/31/2019   Port-A-Cath in place 08/11/2018   Hypothyroidism (acquired) 08/11/2018   Metastasis to lung ([place]) 05/19/2018   Encounter for antineoplastic chemotherapy 05/13/2018   Encounter for antineoplastic immunotherapy 05/13/2018   Goals of care, counseling/discussion 05/13/2018   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[place]) 05/13/2018   Axillary adenopathy 04/24/2018   Right lower lobe lung mass 04/22/2018   Cough with hemoptysis 04/22/2018   Wheezing 04/02/2018   Long-term use of high-risk medication 04/02/2018   Dizziness 06/26/2017   Fatigue 04/30/2016   Insomnia 04/30/2016   Sinusitis 01/31/2016   Hyperlipidemia 01/31/2016   Ear pain 05/09/2015   Nephrolithiasis 04/18/2015   Anemia    Abnormal nuclear stress test    OBESITY, UNSPECIFIED 03/28/2009   Essential hypertension, benign 03/28/2009   PALPITATIONS 03/28/2009   SNORING 03/28/2009    PCP: Francis Dowse, DO  REFERRING PROVIDER: Melina Schools, MD   REFERRING DIAG: M54.50 (ICD-10-CM) - Low back pain, unspecified   Rationale for Evaluation and Treatment Rehabilitation  THERAPY DIAG:  Other low back pain  Muscle weakness (generalized)  Cramp and spasm  Other abnormalities of gait and mobility  ONSET DATE: 03/06/2022 received referral, but pt reports that he has had this pain for  extensive period  SUBJECTIVE:  SUBJECTIVE STATEMENT: This is an early morning for me. I always know my back is there, running at a 5-6/10 and my lats are always there.   PERTINENT HISTORY:  Metastatic lung cancer, CAD, gout, spinal stenosis  PAIN:  Are you having pain? Yes: NPRS scale: 5/10 Pain location: low back and lats Pain description: aching lats, lower back "just a chronic pain" Aggravating factors: activity Relieving factors: cold pack and Tylenol   PRECAUTIONS: None  OCCUPATION: retired Chief Financial Officer  PLOF: Independent and Leisure: going to [employer], yoga classes, outdoor activities  PATIENT GOALS:  To decrease pain to be able to travel again   OBJECTIVE:   DIAGNOSTIC FINDINGS:  Lumbar MRI on 02/15/2022:  Findings: Disc spaces: Degenerative disease with disc height loss at T10-11, T11-12 and L5-S1. Disc desiccation throughout the remainder of the lumbar spine. T12-L1: No significant disc bulge. No neural foraminal stenosis. No central canal stenosis.  L1-L2: No significant disc bulge. No neural foraminal stenosis. No central canal stenosis. Mild bilateral facet arthropathy.  L2-L3: Mild broad-based disc bulge. Mild bilateral facet arthropathy. No foraminal stenosis. Mild spinal stenosis.  L3-L4: Mild broad-based disc bulge. Mild bilateral facet arthropathy. No foraminal or central canal stenosis.  L4-L5: A mild broad-based disc bulge. Moderate bilateral facet arthropathy with ligamentum flavum infolding. Minimal spinal stenosis. No significant foraminal stenosis.  L5-S1: Mild broad-based disc bulge. Mild bilateral facet arthropathy. No foraminal or central canal stenosis.   IMPRESSION: 1. Mild lumbar spine spondylosis as described above.  PATIENT SURVEYS:  03/20/2022:  FOTO 53% (projected  58% by visit 11) Modified Oswestry Low Back Pain Disability Questionnaire: 15 / 50 = 30.0 %  SCREENING FOR RED FLAGS: Bowel or bladder incontinence: No Spinal tumors: No Cauda equina syndrome: No Compression fracture: No Abdominal aneurysm: No  COGNITION:  Overall cognitive status: Within functional limits for tasks assessed     SENSATION: WFL  POSTURE: rounded shoulders and forward head  PALPATION: No tenderness to palpation noted  LUMBAR ROM:    Limited at least 25% throughout with some increased pain   LOWER EXTREMITY MMT:    MMT Right eval Left eval  Hip flexion    Hip extension 4 4  Hip abduction 4 4  Hip adduction    Hip internal rotation    Hip external rotation    Knee flexion    Knee extension    Ankle dorsiflexion    Ankle plantarflexion    Ankle inversion    Ankle eversion     (Blank rows = not tested)  FUNCTIONAL TESTS:  03/20/2022: 5 times sit to stand: 13.4 sec without UE use Timed up and go (TUG): 8.4 sec without assistive device  GAIT: Distance walked: 100 ft Assistive device utilized: None Level of assistance: Complete Independence Comments: antalgic gait pattern noted    TODAY'S TREATMENT:   04/02/22: Nustep level 2 x 8 minutes with PTA present to discuss status  Seated forward flexion stretch with ball: TC on back ribs to breathe into them 10x. Supine TA contraction with Pilates breath 10x TA contraction with tiny steps Bil: 2x5 with TC to prevent abdominals from doming. Added to HEP today. SELF CARE: Posture ed: avoiding sitting/standing in lumbar end range extension. Verbal & visual ed by PTA, Pt could return demo correctly.    03/26/22: Nustep level 5 x5 minutes with PT present to discuss status Seated 3 way green pball rollout 5x10 sec each Sidelying open book stretch x5 bilat Trigger Point Dry-Needling  Treatment instructions: Expect  mild to moderate muscle soreness. S/S of pneumothorax if dry needled over a lung field, and  to seek immediate medical attention should they occur. Patient verbalized understanding of these instructions and education. Patient Consent Given: Yes Education handout provided: Yes Muscles treated: bilateral lumbar multifidi, bilateral rhomboids, bilateral lats Electrical stimulation performed: No Parameters: N/A Treatment response/outcome: Utilized skilled palpation to identify trigger points and tight musculature.  Palpable twitch response and muscle elongation noted.     03/20/2022:  Reviewed HEP   PATIENT EDUCATION:  Education details: Issued HEP Person educated: Patient Education method: Explanation Education comprehension: verbalized understanding and returned demonstration   HOME EXERCISE PROGRAM: Access Code: [email or web address] URL: [email or web address] Date: 03/20/2022 Prepared by: Shelby Dubin Menke  Exercises - Supine Lower Trunk Rotation  - 1 x daily - 7 x weekly - 1 sets - 5 reps - 10 sec hold - Supine Piriformis Stretch with Foot on Ground  - 1 x daily - 7 x weekly - 1 sets - 2 reps - 20 sec hold - Standing Hamstring Stretch with Step  - 1 x daily - 7 x weekly - 1 sets - 2 reps - 20 sec hold - Hip Flexor Stretch on Step  - 1 x daily - 7 x weekly - 1 sets - 2 reps - 20 sec hold - Cat Cow  - 1 x daily - 7 x weekly - 2 sets - 10 reps - Sidelying Open Book Thoracic Lumbar Rotation and Extension  - 1 x daily - 7 x weekly - 1 sets - 10 reps  04/02/22: Added supine TA with tiny steps. Myrene Galas, PTA 04/02/22 8:47 AM   ASSESSMENT:  CLINICAL IMPRESSION: Pt arrives with his normal low back and lat pain. HEP seems to not exacerbate these symptoms. Pt was educated in not being in lumbar hyperextension too much too often and think about being more neutral and vertical. Pt verbally and visually understood concepts. Added supine core activation and eventually added tiny , slow steps in a neutral spine to HEP. Pt has greater difficulty maintaining the left side when  lifting RTLE.   OBJECTIVE IMPAIRMENTS difficulty walking, decreased strength, increased muscle spasms, impaired flexibility, postural dysfunction, and pain.   ACTIVITY LIMITATIONS lifting  PARTICIPATION LIMITATIONS: community activity  PERSONAL FACTORS Age, Time since onset of injury/illness/exacerbation, and 3+ comorbidities: metastatic lung cancer, CAD, spinal stenosis  are also affecting patient's functional outcome.   REHAB POTENTIAL: Good  CLINICAL DECISION MAKING: Evolving/moderate complexity  EVALUATION COMPLEXITY: Moderate   GOALS: Goals reviewed with patient? Yes  SHORT TERM GOALS: Target date: 04/10/2022  Pt will be independent with initial HEP. Baseline: Goal status: INITIAL  2.  Pt will report pain no greater than 4/10 during daily routine and exercise. Baseline:  Goal status: INITIAL   LONG TERM GOALS: Target date: 05/15/2022  Pt will be independent with advanced HEP. Baseline:  Goal status: INITIAL  2.  Pt will increase lumbar FOTO to at least 58% to demonstrate improvements in functional mobility. Baseline: 53% Goal status: INITIAL  3.  Pt will increase bilateral hip strength to at least 4+ to 5-/5 to allow him to more easily perform functional tasks. Baseline:  Goal status: INITIAL  4.  Pt will report pain no greater than 2/10 during typical daily routine and activities. Baseline:  Goal status: INITIAL   PLAN: PT FREQUENCY: 2x/week  PT DURATION: 8 weeks  PLANNED INTERVENTIONS: Therapeutic exercises, Therapeutic activity, Neuromuscular re-education, Balance training, Gait training, Patient/Family education, Joint manipulation, Joint mobilization,  Stair training, Aquatic Therapy, Dry Needling, Electrical stimulation, Spinal manipulation, Spinal mobilization, Cryotherapy, Moist heat, Taping, Traction, Ultrasound, Ionotophoresis 4mg /ml Dexamethasone, Manual therapy, and Re-evaluation.  PLAN FOR NEXT SESSION: Review and perform supine tiny steps making  sure pt is maintaining his core on both sides.   Myrene Galas, PTA 04/02/22 8:47 AM    [facility] [address] [place], [place] [address] Phone # [phone] Fax [phone]

## 2022-04-09 ENCOUNTER — Encounter: Payer: Self-pay | Admitting: Physical Therapy

## 2022-04-09 ENCOUNTER — Ambulatory Visit: Payer: Medicare Other | Admitting: Physical Therapy

## 2022-04-09 DIAGNOSIS — R2689 Other abnormalities of gait and mobility: Secondary | ICD-10-CM

## 2022-04-09 DIAGNOSIS — M545 Low back pain, unspecified: Secondary | ICD-10-CM | POA: Diagnosis not present

## 2022-04-09 DIAGNOSIS — M5459 Other low back pain: Secondary | ICD-10-CM

## 2022-04-09 DIAGNOSIS — M6281 Muscle weakness (generalized): Secondary | ICD-10-CM

## 2022-04-09 DIAGNOSIS — R252 Cramp and spasm: Secondary | ICD-10-CM

## 2022-04-09 NOTE — Therapy (Signed)
OUTPATIENT PHYSICAL THERAPY THORACOLUMBAR EVALUATION   Patient Name: Travis Padilla MRN: [id number] DOB:[birth year], 76 y.o., male Today's Date: 04/09/2022   PT End of Session - 04/09/22 1402     Visit Number 4    Date for PT Re-Evaluation 05/11/22    Authorization Type Med A    PT Start Time 1401    PT Stop Time 0962    PT Time Calculation (min) 44 min    Activity Tolerance Patient tolerated treatment well    Behavior During Therapy [facility] for tasks assessed/performed              Past Medical History:  Diagnosis Date   Abnormal nuclear stress test    December, 2013   Anemia    Axillary adenopathy 04/24/2018   CAD (coronary artery disease)    Mild nonobstructive plaque in cath 2013   Chest pain    December, 2013   Cough with hemoptysis 04/22/2018   Dizziness    Dyslipidemia 07/14/2019   Encounter for antineoplastic immunotherapy 05/13/2018   Gout    Hemorrhoid    Hyperlipidemia    Hypertension    Hypothyroidism (acquired) 08/11/2018   Metastasis to lung ([place]) 05/19/2018   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) ([place]) dx'd 04/17/18   to LN, infrahilar mass, lung nodule and lt axilla   Obesity, unspecified 03/28/2009   Qualifier: Diagnosis of  By: [name], MD, [name]     Palpitations 03/28/2009   Qualifier: Diagnosis of  By: [name], MD, [name]     Right lower lobe lung mass 04/22/2018   Skin cancer    Sleep apnea    SNORING 03/28/2009   Qualifier: Diagnosis of  By: [name], MD, [name]     Spinal stenosis    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   Past Surgical History:  Procedure Laterality Date   basal skin cancer N/A 2019   Nose   BELPHAROPTOSIS REPAIR Bilateral    CARDIAC CATHETERIZATION     CATARACT EXTRACTION W/ INTRAOCULAR LENS  IMPLANT, BILATERAL     COLONOSCOPY N/A 11/03/2014   Procedure: COLONOSCOPY;  Surgeon: Rogene Houston, MD;  [facility];  Service: Endoscopy;   Laterality: N/A;  1225   CORONARY ATHERECTOMY N/A 05/26/2021   Procedure: CORONARY ATHERECTOMY;  Surgeon: Early Osmond, MD;  Location: [place];  Service: Cardiovascular;  Laterality: N/A;   CORONARY STENT INTERVENTION N/A 05/25/2021   Procedure: CORONARY STENT INTERVENTION;  Surgeon: Nelva Bush, MD;  Location: [place] CV LAB;  Service: Cardiovascular;  Laterality: N/A;   CORONARY STENT INTERVENTION N/A 05/26/2021   Procedure: CORONARY STENT INTERVENTION;  Surgeon: Early Osmond, MD;  Location: [place] [facility] LAB;  Service: Cardiovascular;  Laterality: N/A;   ESOPHAGOGASTRODUODENOSCOPY (EGD) WITH PROPOFOL N/A 05/29/2021   Procedure: ESOPHAGOGASTRODUODENOSCOPY (EGD) WITH PROPOFOL;  Surgeon: Gatha Mayer, MD;  Location: [place];  Service: Endoscopy;  Laterality: N/A;   INTRAVASCULAR ULTRASOUND/IVUS N/A 05/26/2021   Procedure: Intravascular Ultrasound/IVUS;  Surgeon: Early Osmond, MD;  Location: [place] CV LAB;  Service: Cardiovascular;  Laterality: N/A;   IR CV LINE INJECTION  08/24/2020   IR FLUORO GUIDED NEEDLE PLC ASPIRATION/INJECTION LOC  11/28/2020   IR IMAGING GUIDED PORT INSERTION  07/11/2018   KNEE CARTILAGE SURGERY Left    Left knee   LEFT HEART CATH AND CORONARY ANGIOGRAPHY N/A 05/25/2021   Procedure: LEFT HEART CATH AND CORONARY ANGIOGRAPHY;  Surgeon:  End, Harrell Gave, MD;  Location: [place] [facility] LAB;  Service: Cardiovascular;  Laterality: N/A;   SKIN CANCER EXCISION  12/2014, 04/25/15   TEMPORARY PACEMAKER N/A 05/26/2021   Procedure: TEMPORARY PACEMAKER;  Surgeon: Early Osmond, MD;  Location: [place] CV LAB;  Service: Cardiovascular;  Laterality: N/A;   VIDEO BRONCHOSCOPY Bilateral 05/09/2018   Procedure: VIDEO BRONCHOSCOPY WITH FLUORO;  Surgeon: Juanito Doom, MD;  [facility];  Service: Cardiopulmonary;  Laterality: Bilateral;   Patient Active Problem List   Diagnosis Date Noted   COPD (chronic obstructive pulmonary  disease) ([place]) 09/22/2021   Back pain 09/22/2021   Fever 06/29/2021   Acute blood loss anemia    Gastrointestinal hemorrhage associated with acute gastritis    Unstable angina ([place]) 05/25/2021   Coronary artery disease involving native coronary artery of native heart without angina pectoris 12/07/2020   SIRS (systemic inflammatory response syndrome) ([place]) 11/27/2020   Encephalitis    Thrombocytopenia (H[facility])    Severe sepsis without septic shock ([place]) 11/26/2020   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   Itching 11/17/2019   Dyslipidemia 07/14/2019   Shoulder pain 03/31/2019   Port-A-Cath in place 08/11/2018   Hypothyroidism (acquired) 08/11/2018   Metastasis to lung ([place]) 05/19/2018   Encounter for antineoplastic chemotherapy 05/13/2018   Encounter for antineoplastic immunotherapy 05/13/2018   Goals of care, counseling/discussion 05/13/2018   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[place]) 05/13/2018   Axillary adenopathy 04/24/2018   Right lower lobe lung mass 04/22/2018   Cough with hemoptysis 04/22/2018   Wheezing 04/02/2018   Long-term use of high-risk medication 04/02/2018   Dizziness 06/26/2017   Fatigue 04/30/2016   Insomnia 04/30/2016   Sinusitis 01/31/2016   Hyperlipidemia 01/31/2016   Ear pain 05/09/2015   Nephrolithiasis 04/18/2015   Anemia    Abnormal nuclear stress test    OBESITY, UNSPECIFIED 03/28/2009   Essential hypertension, benign 03/28/2009   PALPITATIONS 03/28/2009   SNORING 03/28/2009    PCP: Francis Dowse, DO  REFERRING PROVIDER: Melina Schools, MD   REFERRING DIAG: M54.50 (ICD-10-CM) - Low back pain, unspecified   Rationale for Evaluation and Treatment Rehabilitation  THERAPY DIAG:  Other low back pain  Muscle weakness (generalized)  Cramp and spasm  Other abnormalities of gait and mobility  ONSET DATE: 03/06/2022 received referral, but pt reports that he has had this pain for  extensive period  SUBJECTIVE:  SUBJECTIVE STATEMENT: Back pain persists.  PERTINENT HISTORY:  Metastatic lung cancer, CAD, gout, spinal stenosis  PAIN:  Are you having pain? Yes: NPRS scale: 6/10 Pain location: low back and lats Pain description: aching lats, lower back "just a chronic pain" Aggravating factors: activity Relieving factors: cold pack and Tylenol   PRECAUTIONS: None  OCCUPATION: retired Chief Financial Officer  PLOF: Independent and Leisure: going to [employer], yoga classes, outdoor activities  PATIENT GOALS:  To decrease pain to be able to travel again   OBJECTIVE:   DIAGNOSTIC FINDINGS:  Lumbar MRI on 02/15/2022:  Findings: Disc spaces: Degenerative disease with disc height loss at T10-11, T11-12 and L5-S1. Disc desiccation throughout the remainder of the lumbar spine. T12-L1: No significant disc bulge. No neural foraminal stenosis. No central canal stenosis.  L1-L2: No significant disc bulge. No neural foraminal stenosis. No central canal stenosis. Mild bilateral facet arthropathy.  L2-L3: Mild broad-based disc bulge. Mild bilateral facet arthropathy. No foraminal stenosis. Mild spinal stenosis.  L3-L4: Mild broad-based disc bulge. Mild bilateral facet arthropathy. No foraminal or central canal stenosis.  L4-L5: A mild broad-based disc bulge. Moderate bilateral facet arthropathy with ligamentum flavum infolding. Minimal spinal stenosis. No significant foraminal stenosis.  L5-S1: Mild broad-based disc bulge. Mild bilateral facet arthropathy. No foraminal or central canal stenosis.   IMPRESSION: 1. Mild lumbar spine spondylosis as described above.  PATIENT SURVEYS:  03/20/2022:  FOTO 53% (projected 58% by visit 11) Modified Oswestry Low Back Pain Disability Questionnaire: 15 / 50 = 30.0  %  SCREENING FOR RED FLAGS: Bowel or bladder incontinence: No Spinal tumors: No Cauda equina syndrome: No Compression fracture: No Abdominal aneurysm: No  COGNITION:  Overall cognitive status: Within functional limits for tasks assessed     SENSATION: WFL  POSTURE: rounded shoulders and forward head  PALPATION: No tenderness to palpation noted  LUMBAR ROM:    Limited at least 25% throughout with some increased pain   LOWER EXTREMITY MMT:    MMT Right eval Left eval  Hip flexion    Hip extension 4 4  Hip abduction 4 4  Hip adduction    Hip internal rotation    Hip external rotation    Knee flexion    Knee extension    Ankle dorsiflexion    Ankle plantarflexion    Ankle inversion    Ankle eversion     (Blank rows = not tested)  FUNCTIONAL TESTS:  03/20/2022: 5 times sit to stand: 13.4 sec without UE use Timed up and go (TUG): 8.4 sec without assistive device  GAIT: Distance walked: 100 ft Assistive device utilized: None Level of assistance: Complete Independence Comments: antalgic gait pattern noted    TODAY'S TREATMENT:   04/09/22: Nustep level 2 x 8 minutes with PTA present to discuss status  PTA suggested ways pt could decompress his low back in his personal pool. PTA verbally suggested ways with pt having verbal understanding. Supine isometric hip flexion with purple ball 5x Bil: some Lt sided low back pain, Vc to lessen effort Supine bridge: 5x 5 sec hold Added to HEP Supine on blue foam roll;vertical with Pilates breath 4x, then rolling slowing side to side.     04/02/22: Nustep level 2 x 8 minutes with PTA present to discuss status  Seated forward flexion stretch with ball: TC on back ribs to breathe into them 10x. Supine TA contraction with Pilates breath 10x TA contraction with tiny steps Bil: 2x5 with TC to prevent abdominals from doming. Added to  HEP today. SELF CARE: Posture ed: avoiding sitting/standing in lumbar end range extension.  Verbal & visual ed by PTA, Pt could return demo correctly.    03/26/22: Nustep level 5 x5 minutes with PT present to discuss status Seated 3 way green pball rollout 5x10 sec each Sidelying open book stretch x5 bilat Trigger Point Dry-Needling  Treatment instructions: Expect mild to moderate muscle soreness. S/S of pneumothorax if dry needled over a lung field, and to seek immediate medical attention should they occur. Patient verbalized understanding of these instructions and education. Patient Consent Given: Yes Education handout provided: Yes Muscles treated: bilateral lumbar multifidi, bilateral rhomboids, bilateral lats Electrical stimulation performed: No Parameters: N/A Treatment response/outcome: Utilized skilled palpation to identify trigger points and tight musculature.  Palpable twitch response and muscle elongation noted.     03/20/2022:  Reviewed HEP   PATIENT EDUCATION:  Education details: Issued HEP Person educated: Patient Education method: Explanation Education comprehension: verbalized understanding and returned demonstration   HOME EXERCISE PROGRAM: Access Code: [email or web address] URL: [email or web address] Date: 03/20/2022 Prepared by: Shelby Dubin Menke  Exercises - Supine Lower Trunk Rotation  - 1 x daily - 7 x weekly - 1 sets - 5 reps - 10 sec hold - Supine Piriformis Stretch with Foot on Ground  - 1 x daily - 7 x weekly - 1 sets - 2 reps - 20 sec hold - Standing Hamstring Stretch with Step  - 1 x daily - 7 x weekly - 1 sets - 2 reps - 20 sec hold - Hip Flexor Stretch on Step  - 1 x daily - 7 x weekly - 1 sets - 2 reps - 20 sec hold - Cat Cow  - 1 x daily - 7 x weekly - 2 sets - 10 reps - Sidelying Open Book Thoracic Lumbar Rotation and Extension  - 1 x daily - 7 x weekly - 1 sets - 10 reps  04/02/22: Added supine TA with tiny steps. Myrene Galas, PTA 04/09/22 2:51 PM   04/09/22: Added supine bridge Myrene Galas, PTA 04/09/22 2:51 PM    ASSESSMENT:  CLINICAL IMPRESSION: Pt arrives with moderate back pain. Pt continues to do his exercises although he admits his back continues to hurt doing them. Pt has really improved his ability to contract his lower abdominals. Supine bridge was added to his HEP and we performed some decompressive breathing and rolling on the foam roll. Pt reports he would like to do more work with the foam roll.   OBJECTIVE IMPAIRMENTS difficulty walking, decreased strength, increased muscle spasms, impaired flexibility, postural dysfunction, and pain.   ACTIVITY LIMITATIONS lifting  PARTICIPATION LIMITATIONS: community activity  PERSONAL FACTORS Age, Time since onset of injury/illness/exacerbation, and 3+ comorbidities: metastatic lung cancer, CAD, spinal stenosis  are also affecting patient's functional outcome.   REHAB POTENTIAL: Good  CLINICAL DECISION MAKING: Evolving/moderate complexity  EVALUATION COMPLEXITY: Moderate   GOALS: Goals reviewed with patient? Yes  SHORT TERM GOALS: Target date: 04/10/2022  Pt will be independent with initial HEP. Baseline: Goal status: Goal met 04/09/22  2.  Pt will report pain no greater than 4/10 during daily routine and exercise. Baseline:  Goal status: INITIAL   LONG TERM GOALS: Target date: 05/15/2022  Pt will be independent with advanced HEP. Baseline:  Goal status: INITIAL  2.  Pt will increase lumbar FOTO to at least 58% to demonstrate improvements in functional mobility. Baseline: 53% Goal status: INITIAL  3.  Pt will increase bilateral hip strength to at least 4+  to 5-/5 to allow him to more easily perform functional tasks. Baseline:  Goal status: INITIAL  4.  Pt will report pain no greater than 2/10 during typical daily routine and activities. Baseline:  Goal status: INITIAL   PLAN: PT FREQUENCY: 2x/week  PT DURATION: 8 weeks  PLANNED INTERVENTIONS: Therapeutic exercises, Therapeutic activity, Neuromuscular re-education,  Balance training, Gait training, Patient/Family education, Joint manipulation, Joint mobilization, Stair training, Aquatic Therapy, Dry Needling, Electrical stimulation, Spinal manipulation, Spinal mobilization, Cryotherapy, Moist heat, Taping, Traction, Ultrasound, Ionotophoresis 58m/ml Dexamethasone, Manual therapy, and Re-evaluation.  PLAN FOR NEXT SESSION: Pt will need his ERO done on the 22nd of August [place]m Kaufmann[place] expire when he is gone. Review supine bridge and continue more work on the foam roll.   JMyrene Galas PTA 04/09/22 2:51 PM    BThe [facility] [address] SStark CityGRichfield Springs [place] [address]Phone # [phone] [phone]

## 2022-04-11 DIAGNOSIS — Z23 Encounter for immunization: Secondary | ICD-10-CM | POA: Diagnosis not present

## 2022-04-12 ENCOUNTER — Ambulatory Visit: Payer: Self-pay | Admitting: *Deleted

## 2022-04-12 NOTE — Patient Instructions (Signed)
Travis Padilla  At some point during the past 4 years, I have worked with you through the [place] Management Program at [facility].  Due to program changes I am removing myself from your care team.   If you are currently active with another CCM Team Member, you will remain active with them unless they reach out to you with additional information.   If you feel that you need services in the future,  please talk with your primary care provider and request a new referral for Care Management or Care Coordination services. This does not affect your status as a patient at [facility].   Thank you for allowing me to participate in your your healthcare journey.  Chong Sicilian, BSN, RN-BC Proofreader Dial: [phone]

## 2022-04-12 NOTE — Chronic Care Management (AMB) (Signed)
  Chronic Care Management   Note  04/12/2022 Name: Travis Padilla MRN: [id number] DOB: 1946-05-21   I performed a chart review due to changes in the Chronic Care Management program. Patient previously requested un-enrollment in the CCM program. I am removing myself as the [facility] from the Care Team and closing [place]. Patient was not scheduled to be followed by the RN Care Coordination nurse for [employer].   Patient does not have an open Care Plan with another CCM team member, Theadore Nan, LCSW.  I will forward this case closure encounter to the other team member(s). Patient does not have a current CCM referral placed since 01/08/22. CCM enrollment status changed to "not enrolled".   Patient's PCP can place a new referral if the they needs Care Management or Care Coordination services in the future.  Chong Sicilian, BSN, RN-BC Proofreader Dial: [phone]

## 2022-04-13 ENCOUNTER — Encounter (HOSPITAL_BASED_OUTPATIENT_CLINIC_OR_DEPARTMENT_OTHER): Payer: Self-pay | Admitting: Physical Therapy

## 2022-04-13 ENCOUNTER — Ambulatory Visit (HOSPITAL_BASED_OUTPATIENT_CLINIC_OR_DEPARTMENT_OTHER): Payer: Medicare Other | Attending: Orthopedic Surgery | Admitting: Physical Therapy

## 2022-04-13 DIAGNOSIS — R252 Cramp and spasm: Secondary | ICD-10-CM | POA: Diagnosis not present

## 2022-04-13 DIAGNOSIS — M5459 Other low back pain: Secondary | ICD-10-CM | POA: Diagnosis not present

## 2022-04-13 DIAGNOSIS — M6281 Muscle weakness (generalized): Secondary | ICD-10-CM | POA: Insufficient documentation

## 2022-04-13 DIAGNOSIS — R2689 Other abnormalities of gait and mobility: Secondary | ICD-10-CM | POA: Insufficient documentation

## 2022-04-13 NOTE — Therapy (Signed)
OUTPATIENT PHYSICAL THERAPY THORACOLUMBAR TREATMENT NOTE   Patient Name: Travis Padilla MRN: [id number] DOB:21-Oct-1945, 76 y.o., male Today's Date: 04/13/2022   PT End of Session - 04/13/22 0817     Visit Number 5    Date for PT Re-Evaluation 05/11/22    Authorization Type Med A    PT Start Time 0815    PT Stop Time 7741    PT Time Calculation (min) 40 min    Activity Tolerance Patient tolerated treatment well    Behavior During Therapy [facility] for tasks assessed/performed              Past Medical History:  Diagnosis Date   Abnormal nuclear stress test    December, 2013   Anemia    Axillary adenopathy 04/24/2018   CAD (coronary artery disease)    Mild nonobstructive plaque in cath 2013   Chest pain    December, 2013   Cough with hemoptysis 04/22/2018   Dizziness    Dyslipidemia 07/14/2019   Encounter for antineoplastic immunotherapy 05/13/2018   Gout    Hemorrhoid    Hyperlipidemia    Hypertension    Hypothyroidism (acquired) 08/11/2018   Metastasis to lung ([place]) 05/19/2018   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) ([place]) dx'd 04/17/18   to LN, infrahilar mass, lung nodule and lt axilla   Obesity, unspecified 03/28/2009   Qualifier: Diagnosis of  By: [name], MD, [name]     Palpitations 03/28/2009   Qualifier: Diagnosis of  By: [name], MD, [name]     Right lower lobe lung mass 04/22/2018   Skin cancer    Sleep apnea    SNORING 03/28/2009   Qualifier: Diagnosis of  By: [name], MD, [name]     Spinal stenosis    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   Past Surgical History:  Procedure Laterality Date   basal skin cancer N/A 2019   Nose   BELPHAROPTOSIS REPAIR Bilateral    CARDIAC CATHETERIZATION     CATARACT EXTRACTION W/ INTRAOCULAR LENS  IMPLANT, BILATERAL     COLONOSCOPY N/A 11/03/2014   Procedure: COLONOSCOPY;  Surgeon: Rogene Houston, MD;  [facility];  Service: Endoscopy;   Laterality: N/A;  1225   CORONARY ATHERECTOMY N/A 05/26/2021   Procedure: CORONARY ATHERECTOMY;  Surgeon: Early Osmond, MD;  Location: [place] [facility];  Service: Cardiovascular;  Laterality: N/A;   CORONARY STENT INTERVENTION N/A 05/25/2021   Procedure: CORONARY STENT INTERVENTION;  Surgeon: Nelva Bush, MD;  Location: [place] [facility];  Service: Cardiovascular;  Laterality: N/A;   CORONARY STENT INTERVENTION N/A 05/26/2021   Procedure: CORONARY STENT INTERVENTION;  Surgeon: Early Osmond, MD;  Location: [place] [facility];  Service: Cardiovascular;  Laterality: N/A;   ESOPHAGOGASTRODUODENOSCOPY (EGD) WITH PROPOFOL N/A 05/29/2021   Procedure: ESOPHAGOGASTRODUODENOSCOPY (EGD) WITH PROPOFOL;  Surgeon: Gatha Mayer, MD;  Location: [place];  Service: Endoscopy;  Laterality: N/A;   INTRAVASCULAR ULTRASOUND/IVUS N/A 05/26/2021   Procedure: Intravascular Ultrasound/IVUS;  Surgeon: Early Osmond, MD;  Location: [place] [facility];  Service: Cardiovascular;  Laterality: N/A;   IR CV LINE INJECTION  08/24/2020   IR FLUORO GUIDED NEEDLE PLC ASPIRATION/INJECTION LOC  11/28/2020   IR IMAGING GUIDED PORT INSERTION  07/11/2018   KNEE CARTILAGE SURGERY Left    Left knee   LEFT HEART CATH AND CORONARY ANGIOGRAPHY N/A 05/25/2021   Procedure: LEFT HEART CATH AND CORONARY ANGIOGRAPHY;  Surgeon: Nelva Bush, MD;  Location: [place] [facility];  Service: Cardiovascular;  Laterality: N/A;   SKIN CANCER EXCISION  12/2014, 04/25/15   TEMPORARY PACEMAKER N/A 05/26/2021   Procedure: TEMPORARY PACEMAKER;  Surgeon: Early Osmond, MD;  Location: [place] [facility];  Service: Cardiovascular;  Laterality: N/A;   VIDEO BRONCHOSCOPY Bilateral 05/09/2018   Procedure: VIDEO BRONCHOSCOPY WITH FLUORO;  Surgeon: Juanito Doom, MD;  Location: [facility];  Service: Cardiopulmonary;  Laterality: Bilateral;   Patient Active Problem List   Diagnosis Date Noted   COPD (chronic obstructive pulmonary  disease) ([place]) 09/22/2021   Back pain 09/22/2021   Fever 06/29/2021   Acute blood loss anemia    Gastrointestinal hemorrhage associated with acute gastritis    Unstable angina ([place]) 05/25/2021   Coronary artery disease involving native coronary artery of native heart without angina pectoris 12/07/2020   SIRS (systemic inflammatory response syndrome) ([place]) 11/27/2020   Encephalitis    Thrombocytopenia ([facility])    Severe sepsis without septic shock ([place]) 11/26/2020   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   Itching 11/17/2019   Dyslipidemia 07/14/2019   Shoulder pain 03/31/2019   Port-A-Cath in place 08/11/2018   Hypothyroidism (acquired) 08/11/2018   Metastasis to lung ([place]) 05/19/2018   Encounter for antineoplastic chemotherapy 05/13/2018   Encounter for antineoplastic immunotherapy 05/13/2018   Goals of care, counseling/discussion 05/13/2018   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[place]) 05/13/2018   Axillary adenopathy 04/24/2018   Right lower lobe lung mass 04/22/2018   Cough with hemoptysis 04/22/2018   Wheezing 04/02/2018   Long-term use of high-risk medication 04/02/2018   Dizziness 06/26/2017   Fatigue 04/30/2016   Insomnia 04/30/2016   Sinusitis 01/31/2016   Hyperlipidemia 01/31/2016   Ear pain 05/09/2015   Nephrolithiasis 04/18/2015   Anemia    Abnormal nuclear stress test    OBESITY, UNSPECIFIED 03/28/2009   Essential hypertension, benign 03/28/2009   PALPITATIONS 03/28/2009   SNORING 03/28/2009    PCP: Francis Dowse, DO  REFERRING PROVIDER: Melina Schools, MD   REFERRING DIAG: M54.50 (ICD-10-CM) - Low back pain, unspecified   Rationale for Evaluation and Treatment Rehabilitation  THERAPY DIAG:  Other low back pain  Muscle weakness (generalized)  Cramp and spasm  Other abnormalities of gait and mobility  ONSET DATE: 03/06/2022 received referral, but pt reports that he has had this pain for  extensive period  SUBJECTIVE:  SUBJECTIVE STATEMENT: Pt reports no new changes since last PT visit.   PERTINENT HISTORY:  Metastatic lung cancer, CAD, gout, spinal stenosis  PAIN:  Are you having pain? Yes: NPRS scale: 4-5/10 Pain location: L low back and lats Pain description: aching lats, lower back "just a chronic pain" Aggravating factors: activity Relieving factors: cold pack and Tylenol   PRECAUTIONS: None  OCCUPATION: retired Chief Financial Officer  PLOF: Independent and Leisure: going to [employer], yoga classes, outdoor activities  PATIENT GOALS:  To decrease pain to be able to travel again   OBJECTIVE:   DIAGNOSTIC FINDINGS:  Lumbar MRI on 02/15/2022:  Findings: Disc spaces: Degenerative disease with disc height loss at T10-11, T11-12 and L5-S1. Disc desiccation throughout the remainder of the lumbar spine. T12-L1: No significant disc bulge. No neural foraminal stenosis. No central canal stenosis.  L1-L2: No significant disc bulge. No neural foraminal stenosis. No central canal stenosis. Mild bilateral facet arthropathy.  L2-L3: Mild broad-based disc bulge. Mild bilateral facet arthropathy. No foraminal stenosis. Mild spinal stenosis.  L3-L4: Mild broad-based disc bulge. Mild bilateral facet arthropathy. No foraminal or central canal stenosis.  L4-L5: A mild broad-based disc bulge. Moderate bilateral facet arthropathy with ligamentum flavum infolding. Minimal spinal stenosis. No significant foraminal stenosis.  L5-S1: Mild broad-based disc bulge. Mild bilateral facet arthropathy. No foraminal or central canal stenosis.   IMPRESSION: 1. Mild lumbar spine spondylosis as described above.  PATIENT SURVEYS:  03/20/2022:  FOTO 53% (projected 58% by visit 11) Modified Oswestry Low Back Pain Disability  Questionnaire: 15 / 50 = 30.0 %  SCREENING FOR RED FLAGS: Bowel or bladder incontinence: No Spinal tumors: No Cauda equina syndrome: No Compression fracture: No Abdominal aneurysm: No  COGNITION:  Overall cognitive status: Within functional limits for tasks assessed     SENSATION: WFL  POSTURE: rounded shoulders and forward head  PALPATION: No tenderness to palpation noted  LUMBAR ROM:    Limited at least 25% throughout with some increased pain   LOWER EXTREMITY MMT:    MMT Right eval Left eval  Hip flexion    Hip extension 4 4  Hip abduction 4 4  Hip adduction    Hip internal rotation    Hip external rotation    Knee flexion    Knee extension    Ankle dorsiflexion    Ankle plantarflexion    Ankle inversion    Ankle eversion     (Blank rows = not tested)  FUNCTIONAL TESTS:  03/20/2022: 5 times sit to stand: 13.4 sec without UE use Timed up and go (TUG): 8.4 sec without assistive device  GAIT: Distance walked: 100 ft Assistive device utilized: None Level of assistance: Complete Independence Comments: antalgic gait pattern noted    TODAY'S TREATMENT: 04/13/22:   Pt seen for aquatic therapy today.  Treatment took place in water 3.25-59f 8" [facility] Temp of water was 91.  Pt entered/exited the pool via stairs independently with single rail.  * forward/ backward gait * high knee marching forward/ backward * side stepping with arms abdct/add * ab set with thin square noodle push down to thighs * arm abdct/add with core engaged, pushing rainbow hand buoys under water * bilat shoulder horiz abdct/ add, rainbow hand buoys slightly under water * alternating shoulder flexion (to surface) to neutral at side with rainbow hand buoys  * wood chop with single rainbow hand buoy, cues to not flex at trunk but allow rotation  * return to marching -  less discomfort noted * Holding yellow noodle for support: hip openers (hip abdct/knee  flexion) , hip crosses (hip/knee flexion crossing midline) * supported supine to decompress (noodle under arms and legs, nekdoodle) - wears ear plug when supine to keep water out.  Pt requires the buoyancy and hydrostatic pressure of water for support, and to offload joints by unweighting joint load by at least 50 % in navel deep water and by at least 75-80% in chest to neck deep water.  Viscosity of the water is needed for resistance of strengthening. Water current perturbations provides challenge to standing balance requiring increased core activation.    04/09/22: Nustep level 2 x 8 minutes with PTA present to discuss status  PTA suggested ways pt could decompress his low back in his personal pool. PTA verbally suggested ways with pt having verbal understanding. Supine isometric hip flexion with purple ball 5x Bil: some Lt sided low back pain, Vc to lessen effort Supine bridge: 5x 5 sec hold Added to HEP Supine on blue foam roll;vertical with Pilates breath 4x, then rolling slowing side to side.     04/02/22: Nustep level 2 x 8 minutes with PTA present to discuss status  Seated forward flexion stretch with ball: TC on back ribs to breathe into them 10x. Supine TA contraction with Pilates breath 10x TA contraction with tiny steps Bil: 2x5 with TC to prevent abdominals from doming. Added to HEP today. SELF CARE: Posture ed: avoiding sitting/standing in lumbar end range extension. Verbal & visual ed by PTA, Pt could return demo correctly.    03/26/22: Nustep level 5 x5 minutes with PT present to discuss status Seated 3 way green pball rollout 5x10 sec each Sidelying open book stretch x5 bilat Trigger Point Dry-Needling  Treatment instructions: Expect mild to moderate muscle soreness. S/S of pneumothorax if dry needled over a lung field, and to seek immediate medical attention should they occur. Patient verbalized understanding of these instructions and education. Patient Consent Given:  Yes Education handout provided: Yes Muscles treated: bilateral lumbar multifidi, bilateral rhomboids, bilateral lats Electrical stimulation performed: No Parameters: N/A Treatment response/outcome: Utilized skilled palpation to identify trigger points and tight musculature.  Palpable twitch response and muscle elongation noted.     03/20/2022:  Reviewed HEP   PATIENT EDUCATION:  Education details: Issued HEP Person educated: Patient Education method: Explanation Education comprehension: verbalized understanding and returned demonstration   HOME EXERCISE PROGRAM: Access Code: [email or web address] URL: [email or web address] Date: 03/20/2022 Prepared by: Shelby Dubin Menke  Exercises - Supine Lower Trunk Rotation  - 1 x daily - 7 x weekly - 1 sets - 5 reps - 10 sec hold - Supine Piriformis Stretch with Foot on Ground  - 1 x daily - 7 x weekly - 1 sets - 2 reps - 20 sec hold - Standing Hamstring Stretch with Step  - 1 x daily - 7 x weekly - 1 sets - 2 reps - 20 sec hold - Hip Flexor Stretch on Step  - 1 x daily - 7 x weekly - 1 sets - 2 reps - 20 sec hold - Cat Cow  - 1 x daily - 7 x weekly - 2 sets - 10 reps - Sidelying Open Book Thoracic Lumbar Rotation and Extension  - 1 x daily - 7 x weekly - 1 sets - 10 reps  04/02/22: Added supine TA with tiny steps. Myrene Galas, PTA 04/13/22 8:18 AM   04/09/22: Added supine bridge Myrene Galas, PTA 04/13/22 8:18 AM  ASSESSMENT:  CLINICAL IMPRESSION: Pt is confident in aquatic environment and able to take direction from therapist on deck.  Some discomfort in Lt low back reported with wood chop downward motion to Lt; no discomfort with R wood chop. Some tightness reported in Lt low back with Rt hip flexion during inital marching, improved after completing rotational exercise.  Overall exercises tolerated well, with significant reduction of pain while in the water.  Most exercises performed in 4+ ft of water.  Goals are ongoing.    OBJECTIVE IMPAIRMENTS difficulty walking, decreased strength, increased muscle spasms, impaired flexibility, postural dysfunction, and pain.   ACTIVITY LIMITATIONS lifting  PARTICIPATION LIMITATIONS: community activity  PERSONAL FACTORS Age, Time since onset of injury/illness/exacerbation, and 3+ comorbidities: metastatic lung cancer, CAD, spinal stenosis  are also affecting patient's functional outcome.   REHAB POTENTIAL: Good  CLINICAL DECISION MAKING: Evolving/moderate complexity  EVALUATION COMPLEXITY: Moderate   GOALS: Goals reviewed with patient? Yes  SHORT TERM GOALS: Target date: 04/10/2022  Pt will be independent with initial HEP. Baseline: Goal status: Goal met 04/09/22  2.  Pt will report pain no greater than 4/10 during daily routine and exercise. Baseline:  Goal status: INITIAL   LONG TERM GOALS: Target date: 05/15/2022  Pt will be independent with advanced HEP. Baseline:  Goal status: INITIAL  2.  Pt will increase lumbar FOTO to at least 58% to demonstrate improvements in functional mobility. Baseline: 53% Goal status: INITIAL  3.  Pt will increase bilateral hip strength to at least 4+ to 5-/5 to allow him to more easily perform functional tasks. Baseline:  Goal status: INITIAL  4.  Pt will report pain no greater than 2/10 during typical daily routine and activities. Baseline:  Goal status: INITIAL   PLAN: PT FREQUENCY: 2x/week  PT DURATION: 8 weeks  PLANNED INTERVENTIONS: Therapeutic exercises, Therapeutic activity, Neuromuscular re-education, Balance training, Gait training, Patient/Family education, Joint manipulation, Joint mobilization, Stair training, Aquatic Therapy, Dry Needling, Electrical stimulation, Spinal manipulation, Spinal mobilization, Cryotherapy, Moist heat, Taping, Traction, Ultrasound, Ionotophoresis 27m/ml Dexamethasone, Manual therapy, and Re-evaluation.  PLAN FOR NEXT SESSION: Assess response to aquatics.  Pt will need his  ERO done on the 22nd of August [place] when he is gone. Review supine bridge and continue more work on the foam roll.  Add aquatic exercises to HEP if tolerated well.   JKerin Perna PTA 04/13/22 1:30 PM

## 2022-04-14 ENCOUNTER — Other Ambulatory Visit: Payer: Self-pay | Admitting: Family Medicine

## 2022-04-14 DIAGNOSIS — E039 Hypothyroidism, unspecified: Secondary | ICD-10-CM

## 2022-04-16 ENCOUNTER — Other Ambulatory Visit: Payer: Self-pay | Admitting: Medical Oncology

## 2022-04-16 ENCOUNTER — Encounter: Payer: Self-pay | Admitting: Rehabilitative and Restorative Service Providers"

## 2022-04-16 ENCOUNTER — Telehealth: Payer: Self-pay | Admitting: Medical Oncology

## 2022-04-16 ENCOUNTER — Ambulatory Visit: Payer: Medicare Other | Attending: Orthopedic Surgery | Admitting: Rehabilitative and Restorative Service Providers"

## 2022-04-16 DIAGNOSIS — R2689 Other abnormalities of gait and mobility: Secondary | ICD-10-CM | POA: Insufficient documentation

## 2022-04-16 DIAGNOSIS — R252 Cramp and spasm: Secondary | ICD-10-CM | POA: Diagnosis not present

## 2022-04-16 DIAGNOSIS — Z91041 Radiographic dye allergy status: Secondary | ICD-10-CM

## 2022-04-16 DIAGNOSIS — M6281 Muscle weakness (generalized): Secondary | ICD-10-CM | POA: Diagnosis not present

## 2022-04-16 DIAGNOSIS — M5459 Other low back pain: Secondary | ICD-10-CM | POA: Insufficient documentation

## 2022-04-16 MED ORDER — PREDNISONE 50 MG PO TABS: ORAL_TABLET | ORAL | 4 refills | Status: DC

## 2022-04-16 NOTE — Telephone Encounter (Signed)
Premeds-Pt notified of Prednisone refill and to take Benadryl premed as instructed for Iodine allergy .

## 2022-04-16 NOTE — Therapy (Signed)
OUTPATIENT PHYSICAL THERAPY THORACOLUMBAR TREATMENT NOTE   Patient Name: Travis Padilla MRN: [id number] DOB:04/11/1946, 76 y.o., male Today's Date: 04/16/2022   PT End of Session - 04/16/22 1402     Visit Number 6    Date for PT Re-Evaluation 05/11/22    Authorization Type Med A    PT Start Time 1400    PT Stop Time 1440    PT Time Calculation (min) 40 min    Activity Tolerance Patient tolerated treatment well    Behavior During Therapy [facility] for tasks assessed/performed              Past Medical History:  Diagnosis Date   Abnormal nuclear stress test    December, 2013   Anemia    Axillary adenopathy 04/24/2018   CAD (coronary artery disease)    Mild nonobstructive plaque in cath 2013   Chest pain    December, 2013   Cough with hemoptysis 04/22/2018   Dizziness    Dyslipidemia 07/14/2019   Encounter for antineoplastic immunotherapy 05/13/2018   Gout    Hemorrhoid    Hyperlipidemia    Hypertension    Hypothyroidism (acquired) 08/11/2018   Metastasis to lung ([place]) 05/19/2018   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) ([place]) dx'd 04/17/18   to LN, infrahilar mass, lung nodule and lt axilla   Obesity, unspecified 03/28/2009   Qualifier: Diagnosis of  By: [name], MD, [name]     Palpitations 03/28/2009   Qualifier: Diagnosis of  By: [name], MD, [name]     Right lower lobe lung mass 04/22/2018   Skin cancer    Sleep apnea    SNORING 03/28/2009   Qualifier: Diagnosis of  By: [name], MD, [name]     Spinal stenosis    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   Past Surgical History:  Procedure Laterality Date   basal skin cancer N/A 2019   Nose   BELPHAROPTOSIS REPAIR Bilateral    CARDIAC CATHETERIZATION     CATARACT EXTRACTION W/ INTRAOCULAR LENS  IMPLANT, BILATERAL     COLONOSCOPY N/A 11/03/2014   Procedure: COLONOSCOPY;  Surgeon: Rogene Houston, MD;  [facility];  Service: Endoscopy;   Laterality: N/A;  1225   CORONARY ATHERECTOMY N/A 05/26/2021   Procedure: CORONARY ATHERECTOMY;  Surgeon: Early Osmond, MD;  Location: [place] [facility];  Service: Cardiovascular;  Laterality: N/A;   CORONARY STENT INTERVENTION N/A 05/25/2021   Procedure: CORONARY STENT INTERVENTION;  Surgeon: Nelva Bush, MD;  Location: [place] [facility];  Service: Cardiovascular;  Laterality: N/A;   CORONARY STENT INTERVENTION N/A 05/26/2021   Procedure: CORONARY STENT INTERVENTION;  Surgeon: Early Osmond, MD;  Location: [place] [facility];  Service: Cardiovascular;  Laterality: N/A;   ESOPHAGOGASTRODUODENOSCOPY (EGD) WITH PROPOFOL N/A 05/29/2021   Procedure: ESOPHAGOGASTRODUODENOSCOPY (EGD) WITH PROPOFOL;  Surgeon: Gatha Mayer, MD;  Location: [place];  Service: Endoscopy;  Laterality: N/A;   INTRAVASCULAR ULTRASOUND/IVUS N/A 05/26/2021   Procedure: Intravascular Ultrasound/IVUS;  Surgeon: Early Osmond, MD;  Location: [place] [facility];  Service: Cardiovascular;  Laterality: N/A;   IR CV LINE INJECTION  08/24/2020   IR FLUORO GUIDED NEEDLE PLC ASPIRATION/INJECTION LOC  11/28/2020   IR IMAGING GUIDED PORT INSERTION  07/11/2018   KNEE CARTILAGE SURGERY Left    Left knee   LEFT HEART CATH AND CORONARY ANGIOGRAPHY N/A 05/25/2021   Procedure: LEFT HEART CATH AND CORONARY ANGIOGRAPHY;  Surgeon: Nelva Bush, MD;  Location: [place] [facility];  Service: Cardiovascular;  Laterality: N/A;   SKIN CANCER EXCISION  12/2014, 04/25/15   TEMPORARY PACEMAKER N/A 05/26/2021   Procedure: TEMPORARY PACEMAKER;  Surgeon: Early Osmond, MD;  Location: [place] [facility];  Service: Cardiovascular;  Laterality: N/A;   VIDEO BRONCHOSCOPY Bilateral 05/09/2018   Procedure: VIDEO BRONCHOSCOPY WITH FLUORO;  Surgeon: Juanito Doom, MD;  Location: [facility];  Service: Cardiopulmonary;  Laterality: Bilateral;   Patient Active Problem List   Diagnosis Date Noted   COPD (chronic obstructive pulmonary  disease) ([place]) 09/22/2021   Back pain 09/22/2021   Fever 06/29/2021   Acute blood loss anemia    Gastrointestinal hemorrhage associated with acute gastritis    Unstable angina ([place]) 05/25/2021   Coronary artery disease involving native coronary artery of native heart without angina pectoris 12/07/2020   SIRS (systemic inflammatory response syndrome) ([place]) 11/27/2020   Encephalitis    Thrombocytopenia ([facility])    Severe sepsis without septic shock ([place]) 11/26/2020   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   Itching 11/17/2019   Dyslipidemia 07/14/2019   Shoulder pain 03/31/2019   Port-A-Cath in place 08/11/2018   Hypothyroidism (acquired) 08/11/2018   Metastasis to lung ([place]) 05/19/2018   Encounter for antineoplastic chemotherapy 05/13/2018   Encounter for antineoplastic immunotherapy 05/13/2018   Goals of care, counseling/discussion 05/13/2018   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[place]) 05/13/2018   Axillary adenopathy 04/24/2018   Right lower lobe lung mass 04/22/2018   Cough with hemoptysis 04/22/2018   Wheezing 04/02/2018   Long-term use of high-risk medication 04/02/2018   Dizziness 06/26/2017   Fatigue 04/30/2016   Insomnia 04/30/2016   Sinusitis 01/31/2016   Hyperlipidemia 01/31/2016   Ear pain 05/09/2015   Nephrolithiasis 04/18/2015   Anemia    Abnormal nuclear stress test    OBESITY, UNSPECIFIED 03/28/2009   Essential hypertension, benign 03/28/2009   PALPITATIONS 03/28/2009   SNORING 03/28/2009    PCP: Francis Dowse, DO  REFERRING PROVIDER: Melina Schools, MD   REFERRING DIAG: M54.50 (ICD-10-CM) - Low back pain, unspecified   Rationale for Evaluation and Treatment Rehabilitation  THERAPY DIAG:  Other low back pain  Muscle weakness (generalized)  Cramp and spasm  Other abnormalities of gait and mobility  ONSET DATE: 03/06/2022 received referral, but pt reports that he has had this pain for  extensive period  SUBJECTIVE:  SUBJECTIVE STATEMENT: Pt reports no new changes since last PT visit.   PERTINENT HISTORY:  Metastatic lung cancer, CAD, gout, spinal stenosis  PAIN:  Are you having pain? Yes: NPRS scale: 4-5/10 Pain location: L low back and lats Pain description: aching lats, lower back "just a chronic pain" Aggravating factors: activity Relieving factors: cold pack and Tylenol   PRECAUTIONS: None  OCCUPATION: retired Chief Financial Officer  PLOF: Independent and Leisure: going to [employer], yoga classes, outdoor activities  PATIENT GOALS:  To decrease pain to be able to travel again   OBJECTIVE:   DIAGNOSTIC FINDINGS:  Lumbar MRI on 02/15/2022:  Findings: Disc spaces: Degenerative disease with disc height loss at T10-11, T11-12 and L5-S1. Disc desiccation throughout the remainder of the lumbar spine. T12-L1: No significant disc bulge. No neural foraminal stenosis. No central canal stenosis.  L1-L2: No significant disc bulge. No neural foraminal stenosis. No central canal stenosis. Mild bilateral facet arthropathy.  L2-L3: Mild broad-based disc bulge. Mild bilateral facet arthropathy. No foraminal stenosis. Mild spinal stenosis.  L3-L4: Mild broad-based disc bulge. Mild bilateral facet arthropathy. No foraminal or central canal stenosis.  L4-L5: A mild broad-based disc bulge. Moderate bilateral facet arthropathy with ligamentum flavum infolding. Minimal spinal stenosis. No significant foraminal stenosis.  L5-S1: Mild broad-based disc bulge. Mild bilateral facet arthropathy. No foraminal or central canal stenosis.   IMPRESSION: 1. Mild lumbar spine spondylosis as described above.  PATIENT SURVEYS:  03/20/2022:  FOTO 53% (projected 58% by visit 11) Modified Oswestry Low Back Pain Disability  Questionnaire: 15 / 50 = 30.0 %  SCREENING FOR RED FLAGS: Bowel or bladder incontinence: No Spinal tumors: No Cauda equina syndrome: No Compression fracture: No Abdominal aneurysm: No  COGNITION:  Overall cognitive status: Within functional limits for tasks assessed     SENSATION: WFL  POSTURE: rounded shoulders and forward head  PALPATION: No tenderness to palpation noted  LUMBAR ROM:    Limited at least 25% throughout with some increased pain   LOWER EXTREMITY MMT:    MMT Right eval Left eval  Hip flexion    Hip extension 4 4  Hip abduction 4 4  Hip adduction    Hip internal rotation    Hip external rotation    Knee flexion    Knee extension    Ankle dorsiflexion    Ankle plantarflexion    Ankle inversion    Ankle eversion     (Blank rows = not tested)  FUNCTIONAL TESTS:  03/20/2022: 5 times sit to stand: 13.4 sec without UE use Timed up and go (TUG): 8.4 sec without assistive device  GAIT: Distance walked: 100 ft Assistive device utilized: None Level of assistance: Complete Independence Comments: antalgic gait pattern noted    TODAY'S TREATMENT:  04/16/2022: Nustep level 5 x6 minutes with PT present to discuss status Supine bridge: 5x 5 sec hold  Supine isometric hip flexion with purple ball 5x Bil: some Lt sided low back pain, Vc to lessen effort Lower trunk rotation 5x10 sec Supine on blue foam roll;vertical with Pilates breath 4x, then rolling slowing side to side.  Prone alt UE/LE extension 2x10 bilat. Prone press ups 2x10 Trigger Point Dry-Needling  Treatment instructions: Expect mild to moderate muscle soreness. S/S of pneumothorax if dry needled over a lung field, and to seek immediate medical attention should they occur. Patient verbalized understanding of these instructions and education. Patient Consent Given: Yes Education handout provided: Yes Muscles treated: bilateral lumbar multifidi, bilateral rhomboids Electrical stimulation  performed: No  Parameters: N/A Treatment response/outcome: Utilized skilled palpation to identify trigger points and tight musculature.  Palpable twitch response and muscle elongation noted.   Seated lateral lean over blue peanut ball 5x10 sec bilat Wall squats x10 with cuing     04/13/22:   Pt seen for aquatic therapy today.  Treatment took place in water 3.25-20ft 8" in depth at the [facility] pool. Temp of water was 91.  Pt entered/exited the pool via stairs independently with single rail.  * forward/ backward gait * high knee marching forward/ backward * side stepping with arms abdct/add * ab set with thin square noodle push down to thighs * arm abdct/add with core engaged, pushing rainbow hand buoys under water * bilat shoulder horiz abdct/ add, rainbow hand buoys slightly under water * alternating shoulder flexion (to surface) to neutral at side with rainbow hand buoys  * wood chop with single rainbow hand buoy, cues to not flex at trunk but allow rotation  * return to marching - less discomfort noted * Holding yellow noodle for support: hip openers (hip abdct/knee flexion) , hip crosses (hip/knee flexion crossing midline) * supported supine to decompress (noodle under arms and legs, nekdoodle) - wears ear plug when supine to keep water out.  Pt requires the buoyancy and hydrostatic pressure of water for support, and to offload joints by unweighting joint load by at least 50 % in navel deep water and by at least 75-80% in chest to neck deep water.  Viscosity of the water is needed for resistance of strengthening. Water current perturbations provides challenge to standing balance requiring increased core activation.    04/09/22: Nustep level 2 x 8 minutes with PTA present to discuss status  PTA suggested ways pt could decompress his low back in his personal pool. PTA verbally suggested ways with pt having verbal understanding. Supine isometric hip flexion with purple ball 5x  Bil: some Lt sided low back pain, Vc to lessen effort Supine bridge: 5x 5 sec hold Added to HEP Supine on blue foam roll;vertical with Pilates breath 4x, then rolling slowing side to side.      PATIENT EDUCATION:  Education details: Issued HEP Person educated: Patient Education method: Explanation Education comprehension: verbalized understanding and returned demonstration   HOME EXERCISE PROGRAM: Access Code: [email or web address] URL: [email or web address] Date: 04/16/2022 Prepared by: Shelby Dubin Nunzio Banet  Exercises - Supine Lower Trunk Rotation  - 1 x daily - 7 x weekly - 1 sets - 5 reps - 10 sec hold - Supine Piriformis Stretch with Foot on Ground  - 1 x daily - 7 x weekly - 1 sets - 2 reps - 20 sec hold - Standing Hamstring Stretch with Step  - 1 x daily - 7 x weekly - 1 sets - 2 reps - 20 sec hold - Hip Flexor Stretch on Step  - 1 x daily - 7 x weekly - 1 sets - 2 reps - 20 sec hold - Cat Cow  - 1 x daily - 7 x weekly - 2 sets - 10 reps - Sidelying Open Book Thoracic Lumbar Rotation and Extension  - 1 x daily - 7 x weekly - 1 sets - 10 reps - Supine March  - 2 x daily - 7 x weekly - 3 sets - 5 reps - Supine Bridge  - 1 x daily - 2 x weekly - 2 sets - 5 reps - 5 hold   ASSESSMENT:  CLINICAL IMPRESSION: Mr Crisafulli presents to skilled rehabilitation stating that  he can tell that he is starting to feel better. Pt wanted to do dry needling again, as he has not had it since his second visit.  Pt with a good response to the needling with a good twitch response illicited, especially in lumbar multifidi.  Pt continues to require skilled PT to progress towards goal related activities.  OBJECTIVE IMPAIRMENTS difficulty walking, decreased strength, increased muscle spasms, impaired flexibility, postural dysfunction, and pain.   ACTIVITY LIMITATIONS lifting  PARTICIPATION LIMITATIONS: community activity  PERSONAL FACTORS Age, Time since onset of injury/illness/exacerbation, and 3+  comorbidities: metastatic lung cancer, CAD, spinal stenosis  are also affecting patient's functional outcome.   REHAB POTENTIAL: Good  CLINICAL DECISION MAKING: Evolving/moderate complexity  EVALUATION COMPLEXITY: Moderate   GOALS: Goals reviewed with patient? Yes  SHORT TERM GOALS: Target date: 04/10/2022  Pt will be independent with initial HEP. Baseline: Goal status: Goal met 04/09/22  2.  Pt will report pain no greater than 4/10 during daily routine and exercise. Baseline:  Goal status: IN PROGRESS   LONG TERM GOALS: Target date: 05/15/2022  Pt will be independent with advanced HEP. Baseline:  Goal status: INITIAL  2.  Pt will increase lumbar FOTO to at least 58% to demonstrate improvements in functional mobility. Baseline: 53% Goal status: INITIAL  3.  Pt will increase bilateral hip strength to at least 4+ to 5-/5 to allow him to more easily perform functional tasks. Baseline:  Goal status: INITIAL  4.  Pt will report pain no greater than 2/10 during typical daily routine and activities. Baseline:  Goal status: INITIAL   PLAN: PT FREQUENCY: 2x/week  PT DURATION: 8 weeks  PLANNED INTERVENTIONS: Therapeutic exercises, Therapeutic activity, Neuromuscular re-education, Balance training, Gait training, Patient/Family education, Joint manipulation, Joint mobilization, Stair training, Aquatic Therapy, Dry Needling, Electrical stimulation, Spinal manipulation, Spinal mobilization, Cryotherapy, Moist heat, Taping, Traction, Ultrasound, Ionotophoresis 4mg /ml Dexamethasone, Manual therapy, and Re-evaluation.  PLAN FOR NEXT SESSION: Assess response to aquatics.  Pt will need his ERO done on the 22nd of August as he is going to the [place] and his Josem Kaufmann will expire when he is gone. Review supine bridge and continue more work on the foam roll.  Add aquatic exercises to HEP if tolerated well.     Juel Burrow, PT 04/16/22 4:27 PM   [facility] [address] [place], [place] [address] Phone # [phone] Fax [phone]

## 2022-04-17 ENCOUNTER — Other Ambulatory Visit: Payer: Self-pay

## 2022-04-17 ENCOUNTER — Inpatient Hospital Stay: Payer: Medicare Other | Attending: Internal Medicine

## 2022-04-17 ENCOUNTER — Ambulatory Visit (HOSPITAL_COMMUNITY)
Admission: RE | Admit: 2022-04-17 | Discharge: 2022-04-17 | Disposition: A | Discharge status: Home / Self Care | Payer: Medicare Other | Source: Ambulatory Visit | Attending: Internal Medicine | Admitting: Internal Medicine

## 2022-04-17 DIAGNOSIS — R911 Solitary pulmonary nodule: Secondary | ICD-10-CM | POA: Diagnosis not present

## 2022-04-17 DIAGNOSIS — C3412 Malignant neoplasm of upper lobe, left bronchus or lung: Secondary | ICD-10-CM | POA: Insufficient documentation

## 2022-04-17 DIAGNOSIS — C3490 Malignant neoplasm of unspecified part of unspecified bronchus or lung: Secondary | ICD-10-CM | POA: Insufficient documentation

## 2022-04-17 DIAGNOSIS — N200 Calculus of kidney: Secondary | ICD-10-CM | POA: Diagnosis not present

## 2022-04-17 DIAGNOSIS — I700 Atherosclerosis of aorta: Secondary | ICD-10-CM | POA: Insufficient documentation

## 2022-04-17 DIAGNOSIS — K5730 Diverticulosis of large intestine without perforation or abscess without bleeding: Secondary | ICD-10-CM | POA: Diagnosis not present

## 2022-04-17 DIAGNOSIS — I10 Essential (primary) hypertension: Secondary | ICD-10-CM | POA: Insufficient documentation

## 2022-04-17 DIAGNOSIS — E039 Hypothyroidism, unspecified: Secondary | ICD-10-CM | POA: Insufficient documentation

## 2022-04-17 DIAGNOSIS — I2510 Atherosclerotic heart disease of native coronary artery without angina pectoris: Secondary | ICD-10-CM | POA: Insufficient documentation

## 2022-04-17 LAB — CMP (CANCER CENTER ONLY)
ALT: 13 U/L (ref 0–44)
AST: 16 U/L (ref 15–41)
Albumin: 4.5 g/dL (ref 3.5–5.0)
Alkaline Phosphatase: 50 U/L (ref 38–126)
Anion gap: 5 (ref 5–15)
BUN: 22 mg/dL (ref 8–23)
CO2: 28 mmol/L (ref 22–32)
Calcium: 9.6 mg/dL (ref 8.9–10.3)
Chloride: 105 mmol/L (ref 98–111)
Creatinine: 1.11 mg/dL (ref 0.61–1.24)
GFR, Estimated: >60 mL/min (ref >60)
Glucose, Bld: 189 mg/dL — ABNORMAL HIGH (ref 70–99)
Potassium: 5.6 mmol/L — ABNORMAL HIGH (ref 3.5–5.1)
Sodium: 138 mmol/L (ref 135–145)
Total Bilirubin: 0.7 mg/dL (ref 0.3–1.2)
Total Protein: 7.8 g/dL (ref 6.5–8.1)

## 2022-04-17 LAB — CBC WITH DIFFERENTIAL (CANCER CENTER ONLY)
Abs Immature Granulocytes: 0.03 10*3/uL (ref 0.00–0.07)
Basophils Absolute: 0 10*3/uL (ref 0.0–0.1)
Basophils Relative: 0 %
Eosinophils Absolute: 0 10*3/uL (ref 0.0–0.5)
Eosinophils Relative: 0 %
HCT: 39.3 % (ref 39.0–52.0)
Hemoglobin: 14 g/dL (ref 13.0–17.0)
Immature Granulocytes: 0 %
Lymphocytes Relative: 8 %
Lymphs Abs: 0.7 10*3/uL (ref 0.7–4.0)
MCH: 33.5 pg (ref 26.0–34.0)
MCHC: 35.6 g/dL (ref 30.0–36.0)
MCV: 94 fL (ref 80.0–100.0)
Monocytes Absolute: 0.2 10*3/uL (ref 0.1–1.0)
Monocytes Relative: 2 %
Neutro Abs: 7.8 10*3/uL — ABNORMAL HIGH (ref 1.7–7.7)
Neutrophils Relative %: 90 %
Platelet Count: 170 10*3/uL (ref 150–400)
RBC: 4.18 MIL/uL — ABNORMAL LOW (ref 4.22–5.81)
RDW: 11.4 % — ABNORMAL LOW (ref 11.5–15.5)
WBC Count: 8.7 10*3/uL (ref 4.0–10.5)
nRBC: 0 % (ref 0.0–0.2)

## 2022-04-17 LAB — TSH: TSH: 0.1 u[IU]/mL — ABNORMAL LOW (ref 0.350–4.500)

## 2022-04-17 LAB — T4, FREE: Free T4: 1.24 ng/dL — ABNORMAL HIGH (ref 0.61–1.12)

## 2022-04-17 MED ORDER — IOHEXOL 300 MG/ML  SOLN
100.0000 mL | Freq: Once | INTRAMUSCULAR | Status: AC | PRN
  Administered 2022-04-17: 100 mL via INTRAVENOUS

## 2022-04-17 MED ORDER — SODIUM CHLORIDE (PF) 0.9 % IJ SOLN
INTRAMUSCULAR | Status: AC
  Filled 2022-04-17: qty 50

## 2022-04-18 ENCOUNTER — Ambulatory Visit (INDEPENDENT_AMBULATORY_CARE_PROVIDER_SITE_OTHER): Payer: Medicare Other | Admitting: Family Medicine

## 2022-04-18 ENCOUNTER — Encounter: Payer: Self-pay | Admitting: Family Medicine

## 2022-04-18 VITALS — BP 105/68 | HR 70 | Temp 97.9°F | Ht 74.0 in | Wt 230.6 lb

## 2022-04-18 DIAGNOSIS — R739 Hyperglycemia, unspecified: Secondary | ICD-10-CM

## 2022-04-18 DIAGNOSIS — Z23 Encounter for immunization: Secondary | ICD-10-CM

## 2022-04-18 DIAGNOSIS — I10 Essential (primary) hypertension: Secondary | ICD-10-CM

## 2022-04-18 DIAGNOSIS — E039 Hypothyroidism, unspecified: Secondary | ICD-10-CM

## 2022-04-18 DIAGNOSIS — E875 Hyperkalemia: Secondary | ICD-10-CM

## 2022-04-18 DIAGNOSIS — C3491 Malignant neoplasm of unspecified part of right bronchus or lung: Secondary | ICD-10-CM

## 2022-04-18 LAB — BAYER DCA HB A1C WAIVED: HB A1C (BAYER DCA - WAIVED): 5.4 % (ref 4.8–5.6)

## 2022-04-18 MED ORDER — ALBUTEROL SULFATE HFA 108 (90 BASE) MCG/ACT IN AERS
2.0000 | INHALATION_SPRAY | Freq: Four times a day (QID) | RESPIRATORY_TRACT | 6 refills | Status: DC | PRN
Start: 2022-04-18 — End: 2023-08-19

## 2022-04-18 NOTE — Patient Instructions (Signed)
Flu shots will be available in October.  Please schedule your flu shot.

## 2022-04-18 NOTE — Progress Notes (Signed)
Subjective: CC: Checkup PCP: [name], DO YIR:SWNI Travis Padilla is a 76 y.o. male presenting to clinic today for:  1.  Right-sided lung cancer Patient is under the care of pulmonology and hematology/oncology.  His appoint with Dr. Earlie Server is tomorrow.  He notes that he was advised to come off of Trelegy recently by one of the other pulmonologists as they did not see any substantial change in his pulmonary function tests from previous on the medication.  Admits that sometimes he does become short winded but uses his albuterol inhaler for this and this seems to help.  He would like to get his shingles vaccination and update his pneumococcal vaccination today as he is planning on going to [facility] on vacation in the next month or so.  He would also like to review his labs  2.  Hypothyroidism Patient is compliant with Synthroid daily.  His most recent TSH was noted to be suppressed and his free T4 was elevated.  He has not yet started the new regimen that was recommended but he will be planning to do so this weekend.  Does report some night sweats but otherwise no changes in bowel habits.  No significant heart palpitations reported   ROS: Per HPI  Allergies  Allergen Reactions   Iohexol Rash    CT scan contrast caused rash and itching    does fine with premeds.   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) ([place]) dx'd 04/17/18   to LN, infrahilar mass, lung nodule and lt axilla   Obesity, unspecified 03/28/2009   Qualifier: Diagnosis of  By: [name], MD, [name]     Palpitations 03/28/2009   Qualifier: Diagnosis of  By: [name], MD, [name]     Right lower lobe lung mass 04/22/2018   Skin cancer    Sleep apnea    SNORING 03/28/2009   Qualifier: Diagnosis of  By: [name], MD, [name]     Spinal stenosis    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    Current Outpatient Medications:    acetaminophen (TYLENOL) 500 MG tablet, Take 1,000 mg by mouth every 6 (six) hours as needed., Disp: , Rfl:    ALPRAZolam (XANAX) 0.25 MG tablet, Take 1 tablet (0.25 mg total) by mouth at bedtime as needed for anxiety., Disp: 30 tablet, Rfl: 0   Artificial Tear Solution (SOOTHE XP OP), Place 1 drop into both eyes 2 (two) times daily., Disp: , Rfl:    carvedilol (COREG) 25 MG tablet, Take 1 tablet (25 mg total) by mouth 2 (two) times daily with a meal., Disp: 180 tablet, Rfl: 3   clopidogrel (PLAVIX) 75 MG tablet, Take 1 tablet (75 mg  total) by mouth daily., Disp: 90 tablet, Rfl: 2   [employer] IRON 325 (65 Fe) MG tablet, TAKE 1 TABLET BY MOUTH DAILY, Disp: 90 tablet, Rfl: 1   fluticasone (FLONASE) 50 MCG/ACT nasal spray, Place 1 spray into both nostrils daily. (Patient taking differently: Place 1 spray into both nostrils daily as needed for allergies.), Disp: 16 g, Rfl: 3   levothyroxine (SYNTHROID) 175 MCG tablet, TAKE 1 TABLET BY MOUTH EVERY DAY, Disp: 90 tablet, Rfl: 0   methocarbamol (ROBAXIN) 500 MG tablet, Take 500 mg by mouth every 6 (six) hours as needed for muscle spasms., Disp: , Rfl:    nitroGLYCERIN (NITROSTAT) 0.4 MG SL tablet, Place 1 tablet (0.4 mg total) under the tongue every 5 (five) minutes as needed for chest pain., Disp: 25 tablet, Rfl: 2   pantoprazole (PROTONIX) 40 MG tablet, Take 1 tablet (40 mg total)  by mouth daily before breakfast., Disp: 90 tablet, Rfl: 2   polyethylene glycol powder (GLYCOLAX/MIRALAX) 17 GM/SCOOP powder, Take 1 Container by mouth daily., Disp: , Rfl:    PRALUENT 150 MG/ML SOAJ, INJECT 150 MG INTO THE SKIN EVERY 14 (FOURTEEN) DAYS., Disp: 6 mL, Rfl: 3   tamsulosin (FLOMAX) 0.4 MG CAPS capsule, Take 0.4 mg by mouth at bedtime., Disp: , Rfl:    albuterol (VENTOLIN HFA) 108 (90 Base) MCG/ACT inhaler, Inhale 2 puffs into the lungs every 6 (six) hours as needed for wheezing or shortness of breath., Disp: 8 g, Rfl: 6   diphenhydrAMINE (BENADRYL) 50 MG tablet, Take 1 tablet (50 mg total) by mouth once for 1 dose. Pt to take 50 mg of benadryl on 10/20/21 at 12:00 PM (noon). Please call [phone] with any questions., Disp: 1 tablet, Rfl: 0   predniSONE (DELTASONE) 50 MG tablet, Pt to take 50 mg of prednisone on 10/20/21 at 12:00 AM (midnight), 50 mg of prednisone on 10/20/21 at 6:00 AM, and 50 mg of prednisone on 10/20/21 at 12:00 PM (noon). Pt is also to take 50 mg of benadryl on 10/20/21 at 12:00 PM (noon). Please call [phone] with any questions. (Patient not taking: Reported on 01/18/2022), Disp: 3 tablet, Rfl: 0   senna-docusate (SENNA S) 8.6-50 MG tablet, 1 to 2 twice daily for constipation (Patient not taking: Reported on 03/20/2022), Disp: 120 tablet, Rfl: 5 Social History   Socioeconomic History   Marital status: Widowed    Spouse name: Not on file   Number of children: 1   Years of education: Not on file   Highest education level: Not on file  Occupational History   Occupation: Government social research officer  Tobacco Use   Smoking status: Former    Packs/day: 0.20    Years: 2.00    Total pack years: 0.40    Types: Cigarettes    Start date: 10/01/1968    Quit date: 1972    Years since quitting: 51.6   Smokeless tobacco: Never  Vaping Use   Vaping Use: Never used  Substance and Sexual Activity   Alcohol use: No    Comment: hx of    Drug use: No   Sexual activity: Not Currently   Other Topics Concern   Not on file  Social History Narrative   Unable to ask intimate partner violence questions, wife present.    Pt's wife died in 2021-08-24.   Social Determinants of Health   Financial Resource Strain: Low Risk  (10/04/2021)   Overall Financial Resource Strain (CARDIA)    Difficulty of Paying Living Expenses: Not  hard at all  Food Insecurity: No Food Insecurity (10/04/2021)   Hunger Vital Sign    Worried About Running Out of Food in the Last Year: Never true    Ran Out of Food in the Last Year: Never true  Transportation Needs: No Transportation Needs (10/04/2021)   PRAPARE - Hydrologist (Medical): No    Lack of Transportation (Non-Medical): No  Physical Activity: Sufficiently Active (10/04/2021)   Exercise Vital Sign    Days of Exercise per Week: 3 days    Minutes of Exercise per Session: 50 min  Recent Concern: Physical Activity - Insufficiently Active (07/14/2021)   Exercise Vital Sign    Days of Exercise per Week: 2 days    Minutes of Exercise per Session: 60 min  Stress: Stress Concern Present (12/06/2021)   [place]    Feeling of Stress : To some extent  Social Connections: Socially Isolated (10/04/2021)   Social Connection and Isolation Panel [NHANES]    Frequency of Communication with Friends and Family: More than three times a week    Frequency of Social Gatherings with Friends and Family: More than three times a week    Attends Religious Services: Never    Marine scientist or Organizations: No    Attends Archivist Meetings: Never    Marital Status: Widowed  Intimate Partner Violence: Not At Risk (10/04/2021)   Humiliation, Afraid, Rape, and Kick questionnaire    Fear of Current or Ex-Partner: No    Emotionally Abused: No    Physically Abused: No    Sexually Abused: No   Family History  Problem Relation Age of Onset   Heart attack  Father    Heart failure Mother    Parkinson's disease Mother    Healthy Sister    Skin cancer Sister    Neuropathy Brother    COPD Brother    Epilepsy Brother    Healthy Sister    Healthy Sister    Colon cancer Neg Hx     Objective: Office vital signs reviewed. BP 105/68   Pulse 70   Temp 97.9 F (36.6 C)   Ht 6\' 2"  (1.88 m)   Wt 230 lb 9.6 oz (104.6 kg)   SpO2 97%   BMI 29.61 kg/m   Physical Examination:  General: Awake, alert, well nourished, No acute distress HEENT: Sclera white. Cardio: regular rate and rhythm, S1S2 heard, no murmurs appreciated Pulm: Mild bibasilar expiratory wheezes appreciated.  Normal work of breathing on room air. MSK: Ambulating independently with normal gait and station Neuro: No tremor  Assessment/ Plan: 76 y.o. male   Essential hypertension, benign  Hypothyroidism (acquired)  Hyperkalemia - Plan: Basic Metabolic Panel  Elevated serum glucose - Plan: Bayer DCA Hb A1c Waived  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HCC)  Need for pneumococcal 20-valent conjugate vaccination - Plan: Pneumococcal conjugate vaccine 20-valent (Prevnar 20)  Blood pressure is well-controlled.  Thyroid levels were abnormal.  I recommended reducing to 1/2 tablet on weekends and 1 tablet daily all others.  Repeat free T4 and TSH in 4 weeks  Noted to be hyperkalemic on most recent laboratory draw by hematology so this was repeated today.  He is not having any concerning symptoms or signs  Sugar also noted to be markedly elevated patient had reported being fasting during that lab collection so we will check an A1c  Pneumococcal vaccination administered given  history of squamous cell carcinoma of the lung  Orders Placed This Encounter  Procedures   Zoster Recombinant (Shingrix )   Pneumococcal conjugate vaccine 20-valent (Prevnar 20)   Basic Metabolic Panel   Bayer DCA Hb A1c Waived   Meds ordered this encounter  Medications   albuterol  (VENTOLIN HFA) 108 (90 Base) MCG/ACT inhaler    Sig: Inhale 2 puffs into the lungs every 6 (six) hours as needed for wheezing or shortness of breath.    Dispense:  8 g    Refill:  [place] [place] [phone]

## 2022-04-19 ENCOUNTER — Other Ambulatory Visit: Payer: Self-pay

## 2022-04-19 ENCOUNTER — Inpatient Hospital Stay (HOSPITAL_BASED_OUTPATIENT_CLINIC_OR_DEPARTMENT_OTHER): Payer: Medicare Other | Admitting: Internal Medicine

## 2022-04-19 ENCOUNTER — Ambulatory Visit (HOSPITAL_BASED_OUTPATIENT_CLINIC_OR_DEPARTMENT_OTHER): Payer: Medicare Other | Admitting: Physical Therapy

## 2022-04-19 ENCOUNTER — Encounter: Payer: Self-pay | Admitting: Internal Medicine

## 2022-04-19 VITALS — BP 136/79 | HR 69 | Temp 98.4°F | Resp 19 | Wt 231.1 lb

## 2022-04-19 DIAGNOSIS — I10 Essential (primary) hypertension: Secondary | ICD-10-CM | POA: Diagnosis not present

## 2022-04-19 DIAGNOSIS — E039 Hypothyroidism, unspecified: Secondary | ICD-10-CM | POA: Diagnosis not present

## 2022-04-19 DIAGNOSIS — N200 Calculus of kidney: Secondary | ICD-10-CM | POA: Diagnosis not present

## 2022-04-19 DIAGNOSIS — C3491 Malignant neoplasm of unspecified part of right bronchus or lung: Secondary | ICD-10-CM

## 2022-04-19 DIAGNOSIS — C3412 Malignant neoplasm of upper lobe, left bronchus or lung: Secondary | ICD-10-CM | POA: Diagnosis not present

## 2022-04-19 DIAGNOSIS — C3490 Malignant neoplasm of unspecified part of unspecified bronchus or lung: Secondary | ICD-10-CM | POA: Diagnosis not present

## 2022-04-19 DIAGNOSIS — I2510 Atherosclerotic heart disease of native coronary artery without angina pectoris: Secondary | ICD-10-CM | POA: Diagnosis not present

## 2022-04-19 DIAGNOSIS — I700 Atherosclerosis of aorta: Secondary | ICD-10-CM | POA: Diagnosis not present

## 2022-04-19 LAB — BASIC METABOLIC PANEL
BUN/Creatinine Ratio: 16 (ref 10–24)
BUN: 19 mg/dL (ref 8–27)
CO2: 23 mmol/L (ref 20–29)
Calcium: 9.4 mg/dL (ref 8.6–10.2)
Chloride: 105 mmol/L (ref 96–106)
Creatinine, Ser: 1.22 mg/dL (ref 0.76–1.27)
Glucose: 92 mg/dL (ref 70–99)
Potassium: 4.3 mmol/L (ref 3.5–5.2)
Sodium: 141 mmol/L (ref 134–144)
eGFR: 62 mL/min/{1.73_m2} (ref >59)

## 2022-04-19 NOTE — Progress Notes (Signed)
[name] [phone]   [phone]  OFFICE PROGRESS NOTE  Travis Norlander, DO [place] [address]  DIAGNOSIS: Stage IV (T3, N0, M1c) non-small cell lung cancer, squamous cell carcinoma presented with large right infrahilar mass in addition to left upper lobe lung nodule as well as left axillary mass with left axillary lymph node diagnosed in August 2019.  PRIOR THERAPY:  1) Palliative radiotherapy to the right infrahilar mass as well as the axillary mass under the care of Dr. Lisbeth Renshaw. 2) Systemic chemotherapy with carboplatin for AUC of 5, paclitaxel 175 mg/M2 and Keytruda 200 mg IV every 3 weeks status post 4 cycles. 3) Maintenance immunotherapy with single agent Keytruda 200 mg IV every 3 weeks status post 41 cycles.  His treatment is currently on hold secondary to intolerance.  CURRENT THERAPY: Observation.  INTERVAL HISTORY: Travis Padilla 76 y.o. male returns to the clinic today for follow-up visit.  The patient is feeling fine today with no concerning complaints except for mild fatigue.  He also has shortness of breath with exertion and some wheezing.  He denied having any chest pain or hemoptysis.  He has no nausea, vomiting, diarrhea or constipation.  He has no headache or visual changes.  He denied having any weight loss or night sweats.  He had repeat CT scan of the chest, abdomen and pelvis performed recently and he is here for evaluation and discussion of his scan results.  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) ([place]) dx'd 04/17/18   to LN, infrahilar mass, lung nodule and lt axilla   Obesity, unspecified 03/28/2009   Qualifier: Diagnosis of  By: [name], MD, [name]     Palpitations 03/28/2009   Qualifier: Diagnosis of  By: [name], MD, [name]     Right lower lobe lung mass 04/22/2018   Skin cancer    Sleep apnea    SNORING 03/28/2009   Qualifier: Diagnosis of  By: [name], MD, [name]     Spinal stenosis    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[place]) 05/13/2018    ALLERGIES:  is allergic to iohexol, pravastatin, and iodine.  MEDICATIONS:  Current Outpatient Medications  Medication Sig Dispense Refill   acetaminophen (TYLENOL) 500 MG tablet Take 1,000 mg by mouth every 6 (six) hours as needed.     albuterol (VENTOLIN HFA) 108 (90 Base) MCG/ACT inhaler Inhale 2 puffs into the lungs every 6 (six) hours as needed for wheezing or shortness of breath. 8 g 6   ALPRAZolam (XANAX) 0.25 MG tablet Take 1 tablet (0.25 mg total) by mouth at bedtime as needed for anxiety. 30 tablet 0   Artificial Tear Solution (SOOTHE XP OP) Place 1 drop into both eyes 2 (two) times daily.     carvedilol (COREG) 25 MG tablet Take 1  tablet (25 mg total) by mouth 2 (two) times daily with a meal. 180 tablet 3   clopidogrel (PLAVIX) 75 MG tablet Take 1 tablet (75 mg total) by mouth daily. 90 tablet 2   [employer] IRON 325 (65 Fe) MG tablet TAKE 1 TABLET BY MOUTH DAILY 90 tablet 1   diphenhydrAMINE (BENADRYL) 50 MG tablet Take 1 tablet (50 mg total) by mouth once for 1 dose. Pt to take 50 mg of benadryl on 10/20/21 at 12:00 PM (noon). Please call [phone] with any questions. 1 tablet 0   fluticasone (FLONASE) 50 MCG/ACT nasal spray Place 1 spray into both nostrils daily. (Patient taking differently: Place 1 spray into both nostrils daily as needed for allergies.) 16 g 3    levothyroxine (SYNTHROID) 175 MCG tablet TAKE 1 TABLET BY MOUTH EVERY DAY 90 tablet 0   methocarbamol (ROBAXIN) 500 MG tablet Take 500 mg by mouth every 6 (six) hours as needed for muscle spasms.     nitroGLYCERIN (NITROSTAT) 0.4 MG SL tablet Place 1 tablet (0.4 mg total) under the tongue every 5 (five) minutes as needed for chest pain. 25 tablet 2   pantoprazole (PROTONIX) 40 MG tablet Take 1 tablet (40 mg total) by mouth daily before breakfast. 90 tablet 2   polyethylene glycol powder (GLYCOLAX/MIRALAX) 17 GM/SCOOP powder Take 1 Container by mouth daily.     PRALUENT 150 MG/ML SOAJ INJECT 150 MG INTO THE SKIN EVERY 14 (FOURTEEN) DAYS. 6 mL 3   predniSONE (DELTASONE) 50 MG tablet Pt to take 50 mg of prednisone on 10/20/21 at 12:00 AM (midnight), 50 mg of prednisone on 10/20/21 at 6:00 AM, and 50 mg of prednisone on 10/20/21 at 12:00 PM (noon). Pt is also to take 50 mg of benadryl on 10/20/21 at 12:00 PM (noon). Please call [phone] with any questions. (Patient not taking: Reported on 01/18/2022) 3 tablet 0   senna-docusate (SENNA S) 8.6-50 MG tablet 1 to 2 twice daily for constipation (Patient not taking: Reported on 03/20/2022) 120 tablet 5   tamsulosin (FLOMAX) 0.4 MG CAPS capsule Take 0.4 mg by mouth at bedtime.     No current facility-administered medications for this visit.    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   BELPHAROPTOSIS REPAIR Bilateral    CARDIAC CATHETERIZATION     CATARACT EXTRACTION W/ INTRAOCULAR LENS  IMPLANT, BILATERAL     COLONOSCOPY N/A 11/03/2014   Procedure: COLONOSCOPY;  Surgeon: Rogene Houston, MD;  Location: [facility];  Service: Endoscopy;  Laterality: N/A;  1225   CORONARY ATHERECTOMY N/A 05/26/2021   Procedure: CORONARY ATHERECTOMY;  Surgeon: Early Osmond, MD;  Location: [facility];  Service: Cardiovascular;  Laterality: N/A;   CORONARY STENT INTERVENTION N/A 05/25/2021    Procedure: CORONARY STENT INTERVENTION;  Surgeon: Nelva Bush, MD;  Location: [facility];  Service: Cardiovascular;  Laterality: N/A;   CORONARY STENT INTERVENTION N/A 05/26/2021   Procedure: CORONARY STENT INTERVENTION;  Surgeon: Early Osmond, MD;  Location: [facility];  Service: Cardiovascular;  Laterality: N/A;   ESOPHAGOGASTRODUODENOSCOPY (EGD) WITH PROPOFOL N/A 05/29/2021   Procedure: ESOPHAGOGASTRODUODENOSCOPY (EGD) WITH PROPOFOL;  Surgeon: Gatha Mayer, MD;  Location: [facility];  Service: Endoscopy;  Laterality: N/A;   INTRAVASCULAR ULTRASOUND/IVUS N/A 05/26/2021   Procedure: Intravascular Ultrasound/IVUS;  Surgeon: Early Osmond, MD;  Location: [facility];  Service: Cardiovascular;  Laterality: N/A;  IR CV LINE INJECTION  08/24/2020   IR FLUORO GUIDED NEEDLE PLC ASPIRATION/INJECTION LOC  11/28/2020   IR IMAGING GUIDED PORT INSERTION  07/11/2018   KNEE CARTILAGE SURGERY Left    Left knee   LEFT HEART CATH AND CORONARY ANGIOGRAPHY N/A 05/25/2021   Procedure: LEFT HEART CATH AND CORONARY ANGIOGRAPHY;  Surgeon: Nelva Bush, MD;  Location: [facility];  Service: Cardiovascular;  Laterality: N/A;   SKIN CANCER EXCISION  12/2014, 04/25/15   TEMPORARY PACEMAKER N/A 05/26/2021   Procedure: TEMPORARY PACEMAKER;  Surgeon: Early Osmond, MD;  Location: [facility];  Service: Cardiovascular;  Laterality: N/A;   VIDEO BRONCHOSCOPY Bilateral 05/09/2018   Procedure: VIDEO BRONCHOSCOPY WITH FLUORO;  Surgeon: Juanito Doom, MD;  Location: [facility];  Service: Cardiopulmonary;  Laterality: Bilateral;    REVIEW OF SYSTEMS:  Constitutional: positive for fatigue Eyes: negative Ears, nose, mouth, throat, and face: negative Respiratory: positive for cough and wheezing Cardiovascular: negative Gastrointestinal: negative Genitourinary:negative Integument/breast: negative Hematologic/lymphatic:  negative Musculoskeletal:negative Neurological: negative Behavioral/Psych: negative Endocrine: negative Allergic/Immunologic: negative   PHYSICAL EXAMINATION: General appearance: alert, cooperative, fatigued, and no distress Head: Normocephalic, without obvious abnormality, atraumatic Neck: no adenopathy, no JVD, supple, symmetrical, trachea midline, and thyroid not enlarged, symmetric, no tenderness/mass/nodules Lymph nodes: Cervical, supraclavicular, and axillary nodes normal. Resp: clear to auscultation bilaterally Back: symmetric, no curvature. ROM normal. No CVA tenderness. Cardio: regular rate and rhythm, S1, S2 normal, no murmur, click, rub or gallop GI: soft, non-tender; bowel sounds normal; no masses,  no organomegaly Extremities: extremities normal, atraumatic, no cyanosis or edema Neurologic: Alert and oriented X 3, normal strength and tone. Normal symmetric reflexes. Normal coordination and gait  ECOG PERFORMANCE STATUS: 1 - Symptomatic but completely ambulatory  Blood pressure 136/79, pulse 69, temperature 98.4 F (36.9 C), temperature source Oral, resp. rate 19, weight 231 lb 1 oz (104.8 kg), SpO2 99 %.  LABORATORY DATA: Lab Results  Component Value Date   WBC 8.7 04/17/2022   HGB 14.0 04/17/2022   HCT 39.3 04/17/2022   MCV 94.0 04/17/2022   PLT 170 04/17/2022      Chemistry      Component Value Date/Time   NA 141 04/18/2022 1116   K 4.3 04/18/2022 1116   CL 105 04/18/2022 1116   CO2 23 04/18/2022 1116   BUN 19 04/18/2022 1116   CREATININE 1.22 04/18/2022 1116   CREATININE 1.11 04/17/2022 1041      Component Value Date/Time   CALCIUM 9.4 04/18/2022 1116   ALKPHOS 50 04/17/2022 1041   AST 16 04/17/2022 1041   ALT 13 04/17/2022 1041   BILITOT 0.7 04/17/2022 1041       RADIOGRAPHIC STUDIES: CT Chest W Contrast  Result Date: 04/18/2022 CLINICAL DATA:  Lung cancer restaging, status post radiation therapy and chemotherapy * Tracking Code: BO * EXAM: CT  CHEST, ABDOMEN, AND PELVIS WITH CONTRAST TECHNIQUE: Multidetector CT imaging of the chest, abdomen and pelvis was performed following the standard protocol during bolus administration of intravenous contrast. RADIATION DOSE REDUCTION: This exam was performed according to the departmental dose-optimization program which includes automated exposure control, adjustment of the mA and/or kV according to patient size and/or use of iterative reconstruction technique. CONTRAST:  17mL OMNIPAQUE IOHEXOL 300 MG/ML SOLN additional oral enteric contrast COMPARISON:  12/14/2021 FINDINGS: CT CHEST FINDINGS Cardiovascular: Right chest port catheter. Aortic atherosclerosis. Enlargement of the tubular ascending thoracic aorta measuring up to 4.1 x 4.0 cm. Mild cardiomegaly. Three-vessel coronary artery calcifications and  stents. Enlargement of the main pulmonary artery measuring up to 3.9 cm in caliber. No pericardial effusion. Mediastinum/Nodes: Unchanged soft tissue nodule or lymph node in the left axilla measuring 1.7 x 1.3 cm (series 2, image 15). No other enlarged mediastinal, hilar, or axillary lymph nodes. Thyroid gland, trachea, and esophagus demonstrate no significant findings. Lungs/Pleura: Interval decrease in size of a nodule of the anterior left upper lobe, measuring 2.8 x 2.1 cm, previously 3.9 x 3.4 cm (series 7, image 68). Increased, adjacent bandlike consolidation and ground-glass (series 7, image 70). Unchanged post treatment appearance of the perihilar right lung, with paramedian fibrosis and consolidation. No pleural effusion or pneumothorax. Musculoskeletal: No chest wall abnormality. No acute osseous findings. CT ABDOMEN PELVIS FINDINGS Hepatobiliary: No solid liver abnormality is seen. No gallstones, gallbladder wall thickening, or biliary dilatation. Pancreas: Unremarkable. No pancreatic ductal dilatation or surrounding inflammatory changes. Spleen: Normal in size without significant abnormality.  Adrenals/Urinary Tract: Adrenal glands are unremarkable. Multiple small bilateral nonobstructive renal calculi. No ureteral calculi or hydronephrosis. Bladder is unremarkable. Stomach/Bowel: Stomach is within normal limits. Appendix appears normal. No evidence of bowel wall thickening, distention, or inflammatory changes. Descending and sigmoid diverticulosis. Vascular/Lymphatic: Aortic atherosclerosis. No enlarged abdominal or pelvic lymph nodes. Reproductive: Prostatomegaly. Other: No abdominal wall hernia or abnormality. No ascites. Musculoskeletal: No acute osseous findings. IMPRESSION: 1. Interval decrease in size of a nodule of the anterior left upper lobe, with increased, adjacent bandlike consolidation and ground-glass. Findings are consistent with treatment response and developing radiation fibrosis. 2. Unchanged post treatment appearance of the perihilar right lung, with paramedian radiation fibrosis and consolidation. 3. Unchanged soft tissue nodule or lymph node in the left axilla. 4. No evidence of lymphadenopathy or metastatic disease within the abdomen or pelvis. 5. Nonobstructive bilateral nephrolithiasis. 6. Diverticulosis without evidence of acute diverticulitis. 7. Enlargement of the tubular ascending thoracic aorta measuring up to 4.1 cm in caliber. Recommend annual imaging followup by CTA or MRA if not otherwise imaged and clinically appropriate. This recommendation follows 2010 ACCF/AHA/AATS/ACR/ASA/SCA/SCAI/SIR/STS/SVM Guidelines for the Diagnosis and Management of Patients with Thoracic Aortic Disease. Circulation. 2010; 121: H299-M426. Aortic aneurysm NOS (ICD10-I71.9) 8. Enlargement of the main pulmonary artery as can be seen in pulmonary hypertension. 9. Coronary artery disease. Aortic Atherosclerosis (ICD10-I70.0). Electronically Signed   By: Delanna Ahmadi M.D.   On: 04/18/2022 06:48   CT Abdomen Pelvis W Contrast  Result Date: 04/18/2022 CLINICAL DATA:  Lung cancer restaging, status  post radiation therapy and chemotherapy * Tracking Code: BO * EXAM: CT CHEST, ABDOMEN, AND PELVIS WITH CONTRAST TECHNIQUE: Multidetector CT imaging of the chest, abdomen and pelvis was performed following the standard protocol during bolus administration of intravenous contrast. RADIATION DOSE REDUCTION: This exam was performed according to the departmental dose-optimization program which includes automated exposure control, adjustment of the mA and/or kV according to patient size and/or use of iterative reconstruction technique. CONTRAST:  131mL OMNIPAQUE IOHEXOL 300 MG/ML SOLN additional oral enteric contrast COMPARISON:  12/14/2021 FINDINGS: CT CHEST FINDINGS Cardiovascular: Right chest port catheter. Aortic atherosclerosis. Enlargement of the tubular ascending thoracic aorta measuring up to 4.1 x 4.0 cm. Mild cardiomegaly. Three-vessel coronary artery calcifications and stents. Enlargement of the main pulmonary artery measuring up to 3.9 cm in caliber. No pericardial effusion. Mediastinum/Nodes: Unchanged soft tissue nodule or lymph node in the left axilla measuring 1.7 x 1.3 cm (series 2, image 15). No other enlarged mediastinal, hilar, or axillary lymph nodes. Thyroid gland, trachea, and esophagus demonstrate no significant findings. Lungs/Pleura: Interval  decrease in size of a nodule of the anterior left upper lobe, measuring 2.8 x 2.1 cm, previously 3.9 x 3.4 cm (series 7, image 68). Increased, adjacent bandlike consolidation and ground-glass (series 7, image 70). Unchanged post treatment appearance of the perihilar right lung, with paramedian fibrosis and consolidation. No pleural effusion or pneumothorax. Musculoskeletal: No chest wall abnormality. No acute osseous findings. CT ABDOMEN PELVIS FINDINGS Hepatobiliary: No solid liver abnormality is seen. No gallstones, gallbladder wall thickening, or biliary dilatation. Pancreas: Unremarkable. No pancreatic ductal dilatation or surrounding inflammatory  changes. Spleen: Normal in size without significant abnormality. Adrenals/Urinary Tract: Adrenal glands are unremarkable. Multiple small bilateral nonobstructive renal calculi. No ureteral calculi or hydronephrosis. Bladder is unremarkable. Stomach/Bowel: Stomach is within normal limits. Appendix appears normal. No evidence of bowel wall thickening, distention, or inflammatory changes. Descending and sigmoid diverticulosis. Vascular/Lymphatic: Aortic atherosclerosis. No enlarged abdominal or pelvic lymph nodes. Reproductive: Prostatomegaly. Other: No abdominal wall hernia or abnormality. No ascites. Musculoskeletal: No acute osseous findings. IMPRESSION: 1. Interval decrease in size of a nodule of the anterior left upper lobe, with increased, adjacent bandlike consolidation and ground-glass. Findings are consistent with treatment response and developing radiation fibrosis. 2. Unchanged post treatment appearance of the perihilar right lung, with paramedian radiation fibrosis and consolidation. 3. Unchanged soft tissue nodule or lymph node in the left axilla. 4. No evidence of lymphadenopathy or metastatic disease within the abdomen or pelvis. 5. Nonobstructive bilateral nephrolithiasis. 6. Diverticulosis without evidence of acute diverticulitis. 7. Enlargement of the tubular ascending thoracic aorta measuring up to 4.1 cm in caliber. Recommend annual imaging followup by CTA or MRA if not otherwise imaged and clinically appropriate. This recommendation follows 2010 ACCF/AHA/AATS/ACR/ASA/SCA/SCAI/SIR/STS/SVM Guidelines for the Diagnosis and Management of Patients with Thoracic Aortic Disease. Circulation. 2010; 121: O841-Y606. Aortic aneurysm NOS (ICD10-I71.9) 8. Enlargement of the main pulmonary artery as can be seen in pulmonary hypertension. 9. Coronary artery disease. Aortic Atherosclerosis (ICD10-I70.0). Electronically Signed   By: Delanna Ahmadi M.D.   On: 04/18/2022 06:48     ASSESSMENT AND PLAN: This is a  very pleasant 76 years old white male with very light most smoking history recently diagnosed with stage IV (T3, N0, M1c) non-small cell lung cancer, squamous cell carcinoma based on the biopsy from the left axillary mass. He status post 4 cycles of induction systemic chemotherapy with carboplatin, paclitaxel and Keytruda with partial response.  The patient is currently on maintenance treatment with single agent Keytruda status post 41 cycles.  He also received palliative radiotherapy to the enlarging left upper lobe lung mass under the care of Dr. Lisbeth Renshaw.  He has been in observation since that time and he is feeling fine with no concerning complaints except for the mild fatigue and occasional cough with wheezing. He had repeat CT scan of the chest, abdomen and pelvis performed recently.  I personally and independently reviewed the scan images and discussed the result with the patient today. His scan showed improvement in the left upper lobe lung mass after the radiotherapy with no other evidence of progressive metastatic disease. I recommended for the patient to continue on observation with repeat CT scan of the chest, abdomen and pelvis in 4 months. For the hypothyroidism he is currently on levothyroxine managed by his primary care physician. For the history of coronary artery disease as well as the aortic aneurysm, he is followed by Dr. [name]. He was advised to call immediately if he has any other concerning symptoms in the interval. The patient voices understanding  of current disease status and treatment options and is in agreement with the current care plan. All questions were answered. The patient knows to call the clinic with any problems, questions or concerns. We can certainly see the patient much sooner if necessary.  Disclaimer: This note was dictated with voice recognition software. Similar sounding words can inadvertently be transcribed and may not be corrected upon review.

## 2022-04-20 ENCOUNTER — Ambulatory Visit (HOSPITAL_BASED_OUTPATIENT_CLINIC_OR_DEPARTMENT_OTHER): Payer: Medicare Other | Admitting: Physical Therapy

## 2022-04-20 ENCOUNTER — Encounter (HOSPITAL_BASED_OUTPATIENT_CLINIC_OR_DEPARTMENT_OTHER): Payer: Self-pay | Admitting: Physical Therapy

## 2022-04-20 ENCOUNTER — Encounter: Payer: Self-pay | Admitting: Cardiology

## 2022-04-20 ENCOUNTER — Telehealth: Payer: Self-pay | Admitting: Cardiology

## 2022-04-20 DIAGNOSIS — M6281 Muscle weakness (generalized): Secondary | ICD-10-CM | POA: Diagnosis not present

## 2022-04-20 DIAGNOSIS — M5459 Other low back pain: Secondary | ICD-10-CM | POA: Diagnosis not present

## 2022-04-20 DIAGNOSIS — R252 Cramp and spasm: Secondary | ICD-10-CM | POA: Diagnosis not present

## 2022-04-20 DIAGNOSIS — R2689 Other abnormalities of gait and mobility: Secondary | ICD-10-CM | POA: Diagnosis not present

## 2022-04-20 NOTE — Telephone Encounter (Signed)
Error

## 2022-04-20 NOTE — Telephone Encounter (Signed)
Returned call to patient who states that he recently had a CT chest done and reports that the ordering provider mentioned his cardiomegaly as well as enlargement of Aorta and wanted to see if Dr. [name] would review results. Patient would also like to know if Dr. [name] thinks that it is safe for him to fly based off these findings. Patient reports he is flying to [place] at the end of the month. Advised patient I would forward to Dr. [name] for him to review and advise. Patient  verbalized understanding.

## 2022-04-20 NOTE — Therapy (Signed)
OUTPATIENT PHYSICAL THERAPY THORACOLUMBAR TREATMENT NOTE   Patient Name: Travis Padilla MRN: [id number] DOB:12/16/1945, 76 y.o., male Today's Date: 04/20/2022   PT End of Session - 04/20/22 1524     Visit Number 7    Date for PT Re-Evaluation 05/11/22    Authorization Type Med A    PT Start Time 1519    PT Stop Time 1600    PT Time Calculation (min) 41 min    Activity Tolerance Patient tolerated treatment well    Behavior During Therapy [facility] for tasks assessed/performed              Past Medical History:  Diagnosis Date   Abnormal nuclear stress test    December, 2013   Anemia    Axillary adenopathy 04/24/2018   CAD (coronary artery disease)    Mild nonobstructive plaque in cath 2013   Chest pain    December, 2013   Cough with hemoptysis 04/22/2018   Dizziness    Dyslipidemia 07/14/2019   Encounter for antineoplastic immunotherapy 05/13/2018   Gout    Hemorrhoid    Hyperlipidemia    Hypertension    Hypothyroidism (acquired) 08/11/2018   Metastasis to lung ([place]) 05/19/2018   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) ([place]) dx'd 04/17/18   to LN, infrahilar mass, lung nodule and lt axilla   Obesity, unspecified 03/28/2009   Qualifier: Diagnosis of  By: [name], MD, [name]     Palpitations 03/28/2009   Qualifier: Diagnosis of  By: [name], MD, [name]     Right lower lobe lung mass 04/22/2018   Skin cancer    Sleep apnea    SNORING 03/28/2009   Qualifier: Diagnosis of  By: [name], MD, [name]     Spinal stenosis    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   Past Surgical History:  Procedure Laterality Date   basal skin cancer N/A 2019   Nose   BELPHAROPTOSIS REPAIR Bilateral    CARDIAC CATHETERIZATION     CATARACT EXTRACTION W/ INTRAOCULAR LENS  IMPLANT, BILATERAL     COLONOSCOPY N/A 11/03/2014   Procedure: COLONOSCOPY;  Surgeon: Rogene Houston, MD;  [facility];  Service: Endoscopy;   Laterality: N/A;  1225   CORONARY ATHERECTOMY N/A 05/26/2021   Procedure: CORONARY ATHERECTOMY;  Surgeon: Early Osmond, MD;  Location: [place] CV LAB;  Service: Cardiovascular;  Laterality: N/A;   CORONARY STENT INTERVENTION N/A 05/25/2021   Procedure: CORONARY STENT INTERVENTION;  Surgeon: Nelva Bush, MD;  Location: [place] CV LAB;  Service: Cardiovascular;  Laterality: N/A;   CORONARY STENT INTERVENTION N/A 05/26/2021   Procedure: CORONARY STENT INTERVENTION;  Surgeon: Early Osmond, MD;  Location: [place] CV LAB;  Service: Cardiovascular;  Laterality: N/A;   ESOPHAGOGASTRODUODENOSCOPY (EGD) WITH PROPOFOL N/A 05/29/2021   Procedure: ESOPHAGOGASTRODUODENOSCOPY (EGD) WITH PROPOFOL;  Surgeon: Gatha Mayer, MD;  Location: [place];  Service: Endoscopy;  Laterality: N/A;   INTRAVASCULAR ULTRASOUND/IVUS N/A 05/26/2021   Procedure: Intravascular Ultrasound/IVUS;  Surgeon: Early Osmond, MD;  Location: [place] CV LAB;  Service: Cardiovascular;  Laterality: N/A;   IR CV LINE INJECTION  08/24/2020   IR FLUORO GUIDED NEEDLE PLC ASPIRATION/INJECTION LOC  11/28/2020   IR IMAGING GUIDED PORT INSERTION  07/11/2018   KNEE CARTILAGE SURGERY Left    Left knee   LEFT HEART CATH AND CORONARY ANGIOGRAPHY N/A 05/25/2021   Procedure: LEFT HEART CATH AND CORONARY ANGIOGRAPHY;  Surgeon: Nelva Bush, MD;  Location: [place] CV LAB;  Service: Cardiovascular;  Laterality: N/A;   SKIN CANCER EXCISION  12/2014, 04/25/15   TEMPORARY PACEMAKER N/A 05/26/2021   Procedure: TEMPORARY PACEMAKER;  Surgeon: Early Osmond, MD;  Location: [place] CV LAB;  Service: Cardiovascular;  Laterality: N/A;   VIDEO BRONCHOSCOPY Bilateral 05/09/2018   Procedure: VIDEO BRONCHOSCOPY WITH FLUORO;  Surgeon: Juanito Doom, MD;  Location[facility];  Service: Cardiopulmonary;  Laterality: Bilateral;   Patient Active Problem List   Diagnosis Date Noted   COPD (chronic obstructive pulmonary  disease) ([place]) 09/22/2021   Back pain 09/22/2021   Fever 06/29/2021   Acute blood loss anemia    Gastrointestinal hemorrhage associated with acute gastritis    Unstable angina ([place]) 05/25/2021   Coronary artery disease involving native coronary artery of native heart without angina pectoris 12/07/2020   SIRS (systemic inflammatory response syndrome) ([place]) 11/27/2020   Encephalitis    Thrombocytopenia ([facility])    Severe sepsis without septic shock ([place]) 11/26/2020   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   Itching 11/17/2019   Dyslipidemia 07/14/2019   Shoulder pain 03/31/2019   Port-A-Cath in place 08/11/2018   Hypothyroidism (acquired) 08/11/2018   Metastasis to lung ([place]) 05/19/2018   Encounter for antineoplastic chemotherapy 05/13/2018   Encounter for antineoplastic immunotherapy 05/13/2018   Goals of care, counseling/discussion 05/13/2018   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[place]) 05/13/2018   Axillary adenopathy 04/24/2018   Right lower lobe lung mass 04/22/2018   Cough with hemoptysis 04/22/2018   Wheezing 04/02/2018   Long-term use of high-risk medication 04/02/2018   Dizziness 06/26/2017   Fatigue 04/30/2016   Insomnia 04/30/2016   Sinusitis 01/31/2016   Hyperlipidemia 01/31/2016   Ear pain 05/09/2015   Nephrolithiasis 04/18/2015   Anemia    Abnormal nuclear stress test    OBESITY, UNSPECIFIED 03/28/2009   Essential hypertension, benign 03/28/2009   PALPITATIONS 03/28/2009   SNORING 03/28/2009    PCP: Francis Dowse, DO  REFERRING PROVIDER: Melina Schools, MD   REFERRING DIAG: M54.50 (ICD-10-CM) - Low back pain, unspecified   Rationale for Evaluation and Treatment Rehabilitation  THERAPY DIAG:  Other low back pain  Muscle weakness (generalized)  Cramp and spasm  Other abnormalities of gait and mobility  ONSET DATE: 03/06/2022 received referral, but pt reports that he has had this pain for  extensive period  SUBJECTIVE:  SUBJECTIVE STATEMENT: Pt reports his back is starting to feel better.  He states he had some relief with DN; " this time was different".   PERTINENT HISTORY:  Metastatic lung cancer, CAD, gout, spinal stenosis  PAIN:  Are you having pain? Yes: NPRS scale: 4-5/10 Pain location: L low back and lats Pain description: aching lats, lower back "just a chronic pain" Aggravating factors: activity Relieving factors: cold pack and Tylenol   PRECAUTIONS: None  OCCUPATION: retired Chief Financial Officer  PLOF: Independent and Leisure: going to [employer], yoga classes, outdoor activities  PATIENT GOALS:  To decrease pain to be able to travel again   OBJECTIVE:   DIAGNOSTIC FINDINGS:  Lumbar MRI on 02/15/2022:  Findings: Disc spaces: Degenerative disease with disc height loss at T10-11, T11-12 and L5-S1. Disc desiccation throughout the remainder of the lumbar spine. T12-L1: No significant disc bulge. No neural foraminal stenosis. No central canal stenosis.  L1-L2: No significant disc bulge. No neural foraminal stenosis. No central canal stenosis. Mild bilateral facet arthropathy.  L2-L3: Mild broad-based disc bulge. Mild bilateral facet arthropathy. No foraminal stenosis. Mild spinal stenosis.  L3-L4: Mild broad-based disc bulge. Mild bilateral facet arthropathy. No foraminal or central canal stenosis.  L4-L5: A mild broad-based disc bulge. Moderate bilateral facet arthropathy with ligamentum flavum infolding. Minimal spinal stenosis. No significant foraminal stenosis.  L5-S1: Mild broad-based disc bulge. Mild bilateral facet arthropathy. No foraminal or central canal stenosis.   IMPRESSION: 1. Mild lumbar spine spondylosis as described above.  PATIENT SURVEYS:  03/20/2022:  FOTO 53%  (projected 58% by visit 11) Modified Oswestry Low Back Pain Disability Questionnaire: 15 / 50 = 30.0 %  SCREENING FOR RED FLAGS: Bowel or bladder incontinence: No Spinal tumors: No Cauda equina syndrome: No Compression fracture: No Abdominal aneurysm: No  COGNITION:  Overall cognitive status: Within functional limits for tasks assessed     SENSATION: WFL  POSTURE: rounded shoulders and forward head  PALPATION: No tenderness to palpation noted  LUMBAR ROM:    Limited at least 25% throughout with some increased pain   LOWER EXTREMITY MMT:    MMT Right eval Left eval  Hip flexion    Hip extension 4 4  Hip abduction 4 4  Hip adduction    Hip internal rotation    Hip external rotation    Knee flexion    Knee extension    Ankle dorsiflexion    Ankle plantarflexion    Ankle inversion    Ankle eversion     (Blank rows = not tested)  FUNCTIONAL TESTS:  03/20/2022: 5 times sit to stand: 13.4 sec without UE use Timed up and go (TUG): 8.4 sec without assistive device  GAIT: Distance walked: 100 ft Assistive device utilized: None Level of assistance: Complete Independence Comments: antalgic gait pattern noted    TODAY'S TREATMENT:  04/20/22:  Discussed posture (and affects on pain)  and introduced thoracic ext stretch over back of bench on deck. Pt returned demo with cues.   Pt seen for aquatic therapy today.  Treatment took place in water 3.25-78f 8" [facility] Temp of water was 91.  Pt entered/exited the pool via stairs independently with single rail.  * forward/ backward gait * side stepping with arms abdct/add yellow hand buoys * high knee marching forward/ backward-  cues for upright posture and to not lift knee too high *braiding L/R * bilat shoulder horiz abdct/ add, yellow hand buoys slightly under water, in staggered stance *  arms abdct to 90 and trunk rotation to tolerance * Open book in staggered stance holding yellow  hand buoys x 6 each side * Warrior 1, lifting noodle off surface towards ceiling x 6 each leg forward * cycling in deeper water with yellow noodle under arms, behind back * back against wall:  hamstring stretch with foot on thin blue square noodle * supported supine to decompress (noodle under arms and legs, nekdoodle) - wears ear plug when supine to keep water out.  Pt requires the buoyancy and hydrostatic pressure of water for support, and to offload joints by unweighting joint load by at least 50 % in navel deep water and by at least 75-80% in chest to neck deep water.  Viscosity of the water is needed for resistance of strengthening. Water current perturbations provides challenge to standing balance requiring increased core activation.  04/16/2022: Nustep level 5 x6 minutes with PT present to discuss status Supine bridge: 5x 5 sec hold  Supine isometric hip flexion with purple ball 5x Bil: some Lt sided low back pain, Vc to lessen effort Lower trunk rotation 5x10 sec Supine on blue foam roll;vertical with Pilates breath 4x, then rolling slowing side to side.  Prone alt UE/LE extension 2x10 bilat. Prone press ups 2x10 Trigger Point Dry-Needling  Treatment instructions: Expect mild to moderate muscle soreness. S/S of pneumothorax if dry needled over a lung field, and to seek immediate medical attention should they occur. Patient verbalized understanding of these instructions and education. Patient Consent Given: Yes Education handout provided: Yes Muscles treated: bilateral lumbar multifidi, bilateral rhomboids Electrical stimulation performed: No Parameters: N/A Treatment response/outcome: Utilized skilled palpation to identify trigger points and tight musculature.  Palpable twitch response and muscle elongation noted.   Seated lateral lean over blue peanut ball 5x10 sec bilat Wall squats x10 with cuing       PATIENT EDUCATION:  Education details: Issued HEP Person educated:  Patient Education method: Explanation Education comprehension: verbalized understanding and returned demonstration   HOME EXERCISE PROGRAM: Access Code: [email or web address] URL: [email or web address] Date: 04/16/2022 Prepared by: Shelby Dubin Menke  Exercises - Supine Lower Trunk Rotation  - 1 x daily - 7 x weekly - 1 sets - 5 reps - 10 sec hold - Supine Piriformis Stretch with Foot on Ground  - 1 x daily - 7 x weekly - 1 sets - 2 reps - 20 sec hold - Standing Hamstring Stretch with Step  - 1 x daily - 7 x weekly - 1 sets - 2 reps - 20 sec hold - Hip Flexor Stretch on Step  - 1 x daily - 7 x weekly - 1 sets - 2 reps - 20 sec hold - Cat Cow  - 1 x daily - 7 x weekly - 2 sets - 10 reps - Sidelying Open Book Thoracic Lumbar Rotation and Extension  - 1 x daily - 7 x weekly - 1 sets - 10 reps - Supine March  - 2 x daily - 7 x weekly - 3 sets - 5 reps - Supine Bridge  - 1 x daily - 2 x weekly - 2 sets - 5 reps - 5 hold   ASSESSMENT:  CLINICAL IMPRESSION: Positive response to DN last session.  He reported decrease in upper/lower back pain while in water, but fatigue in core muscles. Overall, good tolerance with aquatic exercises.   Pt continues to require skilled PT to progress towards goal related activities.  OBJECTIVE IMPAIRMENTS difficulty walking, decreased strength, increased muscle spasms,  impaired flexibility, postural dysfunction, and pain.   ACTIVITY LIMITATIONS lifting  PARTICIPATION LIMITATIONS: community activity  PERSONAL FACTORS Age, Time since onset of injury/illness/exacerbation, and 3+ comorbidities: metastatic lung cancer, CAD, spinal stenosis  are also affecting patient's functional outcome.   REHAB POTENTIAL: Good  CLINICAL DECISION MAKING: Evolving/moderate complexity  EVALUATION COMPLEXITY: Moderate   GOALS: Goals reviewed with patient? Yes  SHORT TERM GOALS: Target date: 04/10/2022  Pt will be independent with initial HEP. Baseline: Goal status: Goal met  04/09/22  2.  Pt will report pain no greater than 4/10 during daily routine and exercise. Baseline:  Goal status: IN PROGRESS   LONG TERM GOALS: Target date: 05/15/2022  Pt will be independent with advanced HEP. Baseline:  Goal status: INITIAL  2.  Pt will increase lumbar FOTO to at least 58% to demonstrate improvements in functional mobility. Baseline: 53% Goal status: INITIAL  3.  Pt will increase bilateral hip strength to at least 4+ to 5-/5 to allow him to more easily perform functional tasks. Baseline:  Goal status: INITIAL  4.  Pt will report pain no greater than 2/10 during typical daily routine and activities. Baseline:  Goal status: INITIAL   PLAN: PT FREQUENCY: 2x/week  PT DURATION: 8 weeks  PLANNED INTERVENTIONS: Therapeutic exercises, Therapeutic activity, Neuromuscular re-education, Balance training, Gait training, Patient/Family education, Joint manipulation, Joint mobilization, Stair training, Aquatic Therapy, Dry Needling, Electrical stimulation, Spinal manipulation, Spinal mobilization, Cryotherapy, Moist heat, Taping, Traction, Ultrasound, Ionotophoresis 59m/ml Dexamethasone, Manual therapy, and Re-evaluation.  PLAN FOR NEXT SESSION: Pt will need his ERO done on the 22nd of August [place] when he is gone. Review supine bridge and continue more work on the foam roll.    JKerin Perna PTA 04/20/22 4:09 PM

## 2022-04-20 NOTE — Telephone Encounter (Signed)
Pt called stating he has a CT done by Dr. Julien Nordmann, the results are in [place], pt would like Dr. [name] to look over the results because the office who ordered them said they saw an enlargement on them.

## 2022-04-23 ENCOUNTER — Ambulatory Visit: Payer: Medicare Other | Admitting: Physical Therapy

## 2022-04-23 ENCOUNTER — Encounter: Payer: Self-pay | Admitting: Physical Therapy

## 2022-04-23 DIAGNOSIS — M6281 Muscle weakness (generalized): Secondary | ICD-10-CM

## 2022-04-23 DIAGNOSIS — R252 Cramp and spasm: Secondary | ICD-10-CM | POA: Diagnosis not present

## 2022-04-23 DIAGNOSIS — R2689 Other abnormalities of gait and mobility: Secondary | ICD-10-CM

## 2022-04-23 DIAGNOSIS — M5459 Other low back pain: Secondary | ICD-10-CM | POA: Diagnosis not present

## 2022-04-23 NOTE — Telephone Encounter (Signed)
Returned call to patient and advised him of Dr. Rosezella [place] recommendations. Patient verbalized understanding.   [name], MD  You 3 days ago    I do not see any high risk findings on that CT or anything that is unexpected from her previous test.  I do not see any reason he could not fly.    Advised patient to call back to office with any issues, questions, or concerns. Patient verbalized understanding.

## 2022-04-23 NOTE — Therapy (Signed)
OUTPATIENT PHYSICAL THERAPY THORACOLUMBAR TREATMENT NOTE   Patient Name: Travis Padilla MRN: [id number] DOB:19-Aug-1946, 76 y.o., male Today's Date: 04/23/2022   PT End of Session - 04/23/22 1406     Visit Number 8    Date for PT Re-Evaluation 05/11/22    Authorization Type Med A    PT Start Time 1405    PT Stop Time 1443    PT Time Calculation (min) 38 min    Activity Tolerance Patient tolerated treatment well    Behavior During Therapy [facility] for tasks assessed/performed              Past Medical History:  Diagnosis Date   Abnormal nuclear stress test    December, 2013   Anemia    Axillary adenopathy 04/24/2018   CAD (coronary artery disease)    Mild nonobstructive plaque in cath 2013   Chest pain    December, 2013   Cough with hemoptysis 04/22/2018   Dizziness    Dyslipidemia 07/14/2019   Encounter for antineoplastic immunotherapy 05/13/2018   Gout    Hemorrhoid    Hyperlipidemia    Hypertension    Hypothyroidism (acquired) 08/11/2018   Metastasis to lung ([place]) 05/19/2018   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) ([place]) dx'd 04/17/18   to LN, infrahilar mass, lung nodule and lt axilla   Obesity, unspecified 03/28/2009   Qualifier: Diagnosis of  By: [name], MD, [name]     Palpitations 03/28/2009   Qualifier: Diagnosis of  By: [name], MD, [name]     Right lower lobe lung mass 04/22/2018   Skin cancer    Sleep apnea    SNORING 03/28/2009   Qualifier: Diagnosis of  By: [name], MD, [name]     Spinal stenosis    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   Past Surgical History:  Procedure Laterality Date   basal skin cancer N/A 2019   Nose   BELPHAROPTOSIS REPAIR Bilateral    CARDIAC CATHETERIZATION     CATARACT EXTRACTION W/ INTRAOCULAR LENS  IMPLANT, BILATERAL     COLONOSCOPY N/A 11/03/2014   Procedure: COLONOSCOPY;  Surgeon: Rogene Houston, MD;  [facility];  Service: Endoscopy;   Laterality: N/A;  1225   CORONARY ATHERECTOMY N/A 05/26/2021   Procedure: CORONARY ATHERECTOMY;  Surgeon: Early Osmond, MD;  Location: [place] CV LAB;  Service: Cardiovascular;  Laterality: N/A;   CORONARY STENT INTERVENTION N/A 05/25/2021   Procedure: CORONARY STENT INTERVENTION;  Surgeon: Nelva Bush, MD;  Location: [place] CV LAB;  Service: Cardiovascular;  Laterality: N/A;   CORONARY STENT INTERVENTION N/A 05/26/2021   Procedure: CORONARY STENT INTERVENTION;  Surgeon: Early Osmond, MD;  Location: [place] CV LAB;  Service: Cardiovascular;  Laterality: N/A;   ESOPHAGOGASTRODUODENOSCOPY (EGD) WITH PROPOFOL N/A 05/29/2021   Procedure: ESOPHAGOGASTRODUODENOSCOPY (EGD) WITH PROPOFOL;  Surgeon: Gatha Mayer, MD;  Location: [place];  Service: Endoscopy;  Laterality: N/A;   INTRAVASCULAR ULTRASOUND/IVUS N/A 05/26/2021   Procedure: Intravascular Ultrasound/IVUS;  Surgeon: Early Osmond, MD;  Location: [place] CV LAB;  Service: Cardiovascular;  Laterality: N/A;   IR CV LINE INJECTION  08/24/2020   IR FLUORO GUIDED NEEDLE PLC ASPIRATION/INJECTION LOC  11/28/2020   IR IMAGING GUIDED PORT INSERTION  07/11/2018   KNEE CARTILAGE SURGERY Left    Left knee   LEFT HEART CATH AND CORONARY ANGIOGRAPHY N/A 05/25/2021   Procedure: LEFT HEART CATH AND CORONARY ANGIOGRAPHY;  Surgeon: Nelva Bush, MD;  Location: [place] CV LAB;  Service: Cardiovascular;  Laterality: N/A;   SKIN CANCER EXCISION  12/2014, 04/25/15   TEMPORARY PACEMAKER N/A 05/26/2021   Procedure: TEMPORARY PACEMAKER;  Surgeon: Early Osmond, MD;  Location: [place] CV LAB;  Service: Cardiovascular;  Laterality: N/A;   VIDEO BRONCHOSCOPY Bilateral 05/09/2018   Procedure: VIDEO BRONCHOSCOPY WITH FLUORO;  Surgeon: Juanito Doom, MD;  Location: [facility];  Service: Cardiopulmonary;  Laterality: Bilateral;   Patient Active Problem List   Diagnosis Date Noted   COPD (chronic obstructive pulmonary  disease) ([place]) 09/22/2021   Back pain 09/22/2021   Fever 06/29/2021   Acute blood loss anemia    Gastrointestinal hemorrhage associated with acute gastritis    Unstable angina ([place]) 05/25/2021   Coronary artery disease involving native coronary artery of native heart without angina pectoris 12/07/2020   SIRS (systemic inflammatory response syndrome) ([place]) 11/27/2020   Encephalitis    Thrombocytopenia ([facility])    Severe sepsis without septic shock ([place]) 11/26/2020   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   Itching 11/17/2019   Dyslipidemia 07/14/2019   Shoulder pain 03/31/2019   Port-A-Cath in place 08/11/2018   Hypothyroidism (acquired) 08/11/2018   Metastasis to lung ([place]) 05/19/2018   Encounter for antineoplastic chemotherapy 05/13/2018   Encounter for antineoplastic immunotherapy 05/13/2018   Goals of care, counseling/discussion 05/13/2018   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[place]) 05/13/2018   Axillary adenopathy 04/24/2018   Right lower lobe lung mass 04/22/2018   Cough with hemoptysis 04/22/2018   Wheezing 04/02/2018   Long-term use of high-risk medication 04/02/2018   Dizziness 06/26/2017   Fatigue 04/30/2016   Insomnia 04/30/2016   Sinusitis 01/31/2016   Hyperlipidemia 01/31/2016   Ear pain 05/09/2015   Nephrolithiasis 04/18/2015   Anemia    Abnormal nuclear stress test    OBESITY, UNSPECIFIED 03/28/2009   Essential hypertension, benign 03/28/2009   PALPITATIONS 03/28/2009   SNORING 03/28/2009    PCP: Francis Dowse, DO  REFERRING PROVIDER: Melina Schools, MD   REFERRING DIAG: M54.50 (ICD-10-CM) - Low back pain, unspecified   Rationale for Evaluation and Treatment Rehabilitation  THERAPY DIAG:  Other low back pain  Muscle weakness (generalized)  Cramp and spasm  Other abnormalities of gait and mobility  ONSET DATE: 03/06/2022 received referral, but pt reports that he has had this pain for  extensive period  SUBJECTIVE:  SUBJECTIVE STATEMENT: In general everything is getting better. I did not tolerate the cross over steps in the pool. My knee is very sore still today, wearing a wrap for compression.   PERTINENT HISTORY:  Metastatic lung cancer, CAD, gout, spinal stenosis  PAIN:  Are you having pain? Yes: Rt knee 4-5/10 from pool cross over steps.    PRECAUTIONS: None  OCCUPATION: retired Chief Financial Officer  PLOF: Independent and Leisure: going to [employer], yoga classes, outdoor activities  PATIENT GOALS:  To decrease pain to be able to travel again   OBJECTIVE:   DIAGNOSTIC FINDINGS:  Lumbar MRI on 02/15/2022:  Findings: Disc spaces: Degenerative disease with disc height loss at T10-11, T11-12 and L5-S1. Disc desiccation throughout the remainder of the lumbar spine. T12-L1: No significant disc bulge. No neural foraminal stenosis. No central canal stenosis.  L1-L2: No significant disc bulge. No neural foraminal stenosis. No central canal stenosis. Mild bilateral facet arthropathy.  L2-L3: Mild broad-based disc bulge. Mild bilateral facet arthropathy. No foraminal stenosis. Mild spinal stenosis.  L3-L4: Mild broad-based disc bulge. Mild bilateral facet arthropathy. No foraminal or central canal stenosis.  L4-L5: A mild broad-based disc bulge. Moderate bilateral facet arthropathy with ligamentum flavum infolding. Minimal spinal stenosis. No significant foraminal stenosis.  L5-S1: Mild broad-based disc bulge. Mild bilateral facet arthropathy. No foraminal or central canal stenosis.   IMPRESSION: 1. Mild lumbar spine spondylosis as described above.  PATIENT SURVEYS:  03/20/2022:  FOTO 53% (projected 58% by visit 11) Modified Oswestry Low Back Pain Disability Questionnaire: 15 / 50 = 30.0  %  SCREENING FOR RED FLAGS: Bowel or bladder incontinence: No Spinal tumors: No Cauda equina syndrome: No Compression fracture: No Abdominal aneurysm: No  COGNITION:  Overall cognitive status: Within functional limits for tasks assessed     SENSATION: WFL  POSTURE: rounded shoulders and forward head  PALPATION: No tenderness to palpation noted  LUMBAR ROM:    Limited at least 25% throughout with some increased pain   LOWER EXTREMITY MMT:    MMT Right eval Left eval  Hip flexion    Hip extension 4 4  Hip abduction 4 4  Hip adduction    Hip internal rotation    Hip external rotation    Knee flexion    Knee extension    Ankle dorsiflexion    Ankle plantarflexion    Ankle inversion    Ankle eversion     (Blank rows = not tested)  FUNCTIONAL TESTS:  03/20/2022: 5 times sit to stand: 13.4 sec without UE use Timed up and go (TUG): 8.4 sec without assistive device  GAIT: Distance walked: 100 ft Assistive device utilized: None Level of assistance: Complete Independence Comments: antalgic gait pattern noted    TODAY'S TREATMENT:   04/23/22:  Supine head press/chin tuck, shoulder press, lower abdominals contract: hold 5 sec 10x. Added to HEP today.  Supine yellow loop clamshells 2x10 VC to add cervical retraction/shoulder press to exercise.  Supine tiny steps with yellow loop above knee Bil 6x Supine bridge with yellow loop clamshell 10x Supine on blue foam roll: red band diagonals 10x Bil    04/20/22:  Discussed posture (and affects on pain)  and introduced thoracic ext stretch over back of bench on deck. Pt returned demo with cues.   Pt seen for aquatic therapy today.  Treatment took place in water 3.25-52f 8" [facility] Temp of water was 91.  Pt entered/exited the pool via stairs independently with single  rail.  * forward/ backward gait * side stepping with arms abdct/add yellow hand buoys * high knee marching forward/  backward-  cues for upright posture and to not lift knee too high *braiding L/R * bilat shoulder horiz abdct/ add, yellow hand buoys slightly under water, in staggered stance * arms abdct to 90 and trunk rotation to tolerance * Open book in staggered stance holding yellow hand buoys x 6 each side * Warrior 1, lifting noodle off surface towards ceiling x 6 each leg forward * cycling in deeper water with yellow noodle under arms, behind back * back against wall:  hamstring stretch with foot on thin blue square noodle * supported supine to decompress (noodle under arms and legs, nekdoodle) - wears ear plug when supine to keep water out.  Pt requires the buoyancy and hydrostatic pressure of water for support, and to offload joints by unweighting joint load by at least 50 % in navel deep water and by at least 75-80% in chest to neck deep water.  Viscosity of the water is needed for resistance of strengthening. Water current perturbations provides challenge to standing balance requiring increased core activation.  04/16/2022: Nustep level 5 x6 minutes with PT present to discuss status Supine bridge: 5x 5 sec hold  Supine isometric hip flexion with purple ball 5x Bil: some Lt sided low back pain, Vc to lessen effort Lower trunk rotation 5x10 sec Supine on blue foam roll;vertical with Pilates breath 4x, then rolling slowing side to side.  Prone alt UE/LE extension 2x10 bilat. Prone press ups 2x10 Trigger Point Dry-Needling  Treatment instructions: Expect mild to moderate muscle soreness. S/S of pneumothorax if dry needled over a lung field, and to seek immediate medical attention should they occur. Patient verbalized understanding of these instructions and education. Patient Consent Given: Yes Education handout provided: Yes Muscles treated: bilateral lumbar multifidi, bilateral rhomboids Electrical stimulation performed: No Parameters: N/A Treatment response/outcome: Utilized skilled palpation to  identify trigger points and tight musculature.  Palpable twitch response and muscle elongation noted.   Seated lateral lean over blue peanut ball 5x10 sec bilat Wall squats x10 with cuing       PATIENT EDUCATION:  Education details: Issued HEP Person educated: Patient Education method: Explanation Education comprehension: verbalized understanding and returned demonstration   HOME EXERCISE PROGRAM: Access Code: [email or web address] URL: [email or web address] Date: 04/16/2022 Prepared by: Shelby Dubin Menke  Exercises - Supine Lower Trunk Rotation  - 1 x daily - 7 x weekly - 1 sets - 5 reps - 10 sec hold - Supine Piriformis Stretch with Foot on Ground  - 1 x daily - 7 x weekly - 1 sets - 2 reps - 20 sec hold - Standing Hamstring Stretch with Step  - 1 x daily - 7 x weekly - 1 sets - 2 reps - 20 sec hold - Hip Flexor Stretch on Step  - 1 x daily - 7 x weekly - 1 sets - 2 reps - 20 sec hold - Cat Cow  - 1 x daily - 7 x weekly - 2 sets - 10 reps - Sidelying Open Book Thoracic Lumbar Rotation and Extension  - 1 x daily - 7 x weekly - 1 sets - 10 reps - Supine March  - 2 x daily - 7 x weekly - 3 sets - 5 reps - Supine Bridge  - 1 x daily - 2 x weekly - 2 sets - 5 reps - 5 hold   ASSESSMENT:  CLINICAL IMPRESSION: Pt  reports his back and lats are feeling better in general. He does arrive with a compression wrap on hs RT knee. He reports doing the cross over steps in the water was too much on his knee. Care was taken during todays treatment to avoid too much strain on the knee joint.   OBJECTIVE IMPAIRMENTS difficulty walking, decreased strength, increased muscle spasms, impaired flexibility, postural dysfunction, and pain.   ACTIVITY LIMITATIONS lifting  PARTICIPATION LIMITATIONS: community activity  PERSONAL FACTORS Age, Time since onset of injury/illness/exacerbation, and 3+ comorbidities: metastatic lung cancer, CAD, spinal stenosis  are also affecting patient's functional outcome.    REHAB POTENTIAL: Good  CLINICAL DECISION MAKING: Evolving/moderate complexity  EVALUATION COMPLEXITY: Moderate   GOALS: Goals reviewed with patient? Yes  SHORT TERM GOALS: Target date: 04/10/2022  Pt will be independent with initial HEP. Baseline: Goal status: Goal met 04/09/22  2.  Pt will report pain no greater than 4/10 during daily routine and exercise. Baseline:  Goal status: IN PROGRESS   LONG TERM GOALS: Target date: 05/15/2022  Pt will be independent with advanced HEP. Baseline:  Goal status: goal met 04/23/22  2.  Pt will increase lumbar FOTO to at least 58% to demonstrate improvements in functional mobility. Baseline: 53% Goal status: INITIAL  3.  Pt will increase bilateral hip strength to at least 4+ to 5-/5 to allow him to more easily perform functional tasks. Baseline:  Goal status: INITIAL  4.  Pt will report pain no greater than 2/10 during typical daily routine and activities. Baseline:  Goal status: INITIAL   PLAN: PT FREQUENCY: 2x/week  PT DURATION: 8 weeks  PLANNED INTERVENTIONS: Therapeutic exercises, Therapeutic activity, Neuromuscular re-education, Balance training, Gait training, Patient/Family education, Joint manipulation, Joint mobilization, Stair training, Aquatic Therapy, Dry Needling, Electrical stimulation, Spinal manipulation, Spinal mobilization, Cryotherapy, Moist heat, Taping, Traction, Ultrasound, Ionotophoresis 102m/ml Dexamethasone, Manual therapy, and Re-evaluation.  PLAN FOR NEXT SESSION: Pt will need his ERO done on the 13th of [place] when he is gone. Review supine bridge and continue more work on the foam roll.

## 2022-04-24 DIAGNOSIS — Z23 Encounter for immunization: Secondary | ICD-10-CM | POA: Diagnosis not present

## 2022-04-26 ENCOUNTER — Ambulatory Visit (HOSPITAL_BASED_OUTPATIENT_CLINIC_OR_DEPARTMENT_OTHER): Payer: Medicare Other | Admitting: Physical Therapy

## 2022-04-26 ENCOUNTER — Other Ambulatory Visit: Payer: Self-pay | Admitting: Family Medicine

## 2022-04-26 DIAGNOSIS — E039 Hypothyroidism, unspecified: Secondary | ICD-10-CM

## 2022-04-27 ENCOUNTER — Ambulatory Visit (HOSPITAL_BASED_OUTPATIENT_CLINIC_OR_DEPARTMENT_OTHER): Payer: Medicare Other | Admitting: Physical Therapy

## 2022-04-30 ENCOUNTER — Encounter: Payer: Self-pay | Admitting: Physical Therapy

## 2022-04-30 ENCOUNTER — Ambulatory Visit: Payer: Medicare Other | Admitting: Physical Therapy

## 2022-04-30 DIAGNOSIS — R2689 Other abnormalities of gait and mobility: Secondary | ICD-10-CM

## 2022-04-30 DIAGNOSIS — M5459 Other low back pain: Secondary | ICD-10-CM

## 2022-04-30 DIAGNOSIS — R252 Cramp and spasm: Secondary | ICD-10-CM | POA: Diagnosis not present

## 2022-04-30 DIAGNOSIS — M6281 Muscle weakness (generalized): Secondary | ICD-10-CM

## 2022-04-30 NOTE — Therapy (Signed)
OUTPATIENT PHYSICAL THERAPY THORACOLUMBAR TREATMENT NOTE   Patient Name: Travis Padilla MRN: [id number] DOB:21-Apr-1946, 76 y.o., male Today's Date: 04/30/2022   PT End of Session - 04/30/22 1358     Visit Number 9    Date for PT Re-Evaluation 05/11/22    Authorization Type Med A    PT Start Time 78    PT Stop Time 2395    PT Time Calculation (min) 41 min    Activity Tolerance Patient tolerated treatment well    Behavior During Therapy [facility] for tasks assessed/performed              Past Medical History:  Diagnosis Date   Abnormal nuclear stress test    December, 2013   Anemia    Axillary adenopathy 04/24/2018   CAD (coronary artery disease)    Mild nonobstructive plaque in cath 2013   Chest pain    December, 2013   Cough with hemoptysis 04/22/2018   Dizziness    Dyslipidemia 07/14/2019   Encounter for antineoplastic immunotherapy 05/13/2018   Gout    Hemorrhoid    Hyperlipidemia    Hypertension    Hypothyroidism (acquired) 08/11/2018   Metastasis to lung ([place]) 05/19/2018   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) ([place]) dx'd 04/17/18   to LN, infrahilar mass, lung nodule and lt axilla   Obesity, unspecified 03/28/2009   Qualifier: Diagnosis of  By: [name], MD, [name]     Palpitations 03/28/2009   Qualifier: Diagnosis of  By: [name], MD, [name]     Right lower lobe lung mass 04/22/2018   Skin cancer    Sleep apnea    SNORING 03/28/2009   Qualifier: Diagnosis of  By: [name], MD, [name]     Spinal stenosis    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   Past Surgical History:  Procedure Laterality Date   basal skin cancer N/A 2019   Nose   BELPHAROPTOSIS REPAIR Bilateral    CARDIAC CATHETERIZATION     CATARACT EXTRACTION W/ INTRAOCULAR LENS  IMPLANT, BILATERAL     COLONOSCOPY N/A 11/03/2014   Procedure: COLONOSCOPY;  Surgeon: Rogene Houston, MD;  [facility];  Service: Endoscopy;   Laterality: N/A;  1225   CORONARY ATHERECTOMY N/A 05/26/2021   Procedure: CORONARY ATHERECTOMY;  Surgeon: Early Osmond, MD;  Location: [place] [facility];  Service: Cardiovascular;  Laterality: N/A;   CORONARY STENT INTERVENTION N/A 05/25/2021   Procedure: CORONARY STENT INTERVENTION;  Surgeon: Nelva Bush, MD;  Location: [place] [facility];  Service: Cardiovascular;  Laterality: N/A;   CORONARY STENT INTERVENTION N/A 05/26/2021   Procedure: CORONARY STENT INTERVENTION;  Surgeon: Early Osmond, MD;  Location: [place] [facility];  Service: Cardiovascular;  Laterality: N/A;   ESOPHAGOGASTRODUODENOSCOPY (EGD) WITH PROPOFOL N/A 05/29/2021   Procedure: ESOPHAGOGASTRODUODENOSCOPY (EGD) WITH PROPOFOL;  Surgeon: Gatha Mayer, MD;  Location: [place];  Service: Endoscopy;  Laterality: N/A;   INTRAVASCULAR ULTRASOUND/IVUS N/A 05/26/2021   Procedure: Intravascular Ultrasound/IVUS;  Surgeon: Early Osmond, MD;  Location: [place] [facility];  Service: Cardiovascular;  Laterality: N/A;   IR CV LINE INJECTION  08/24/2020   IR FLUORO GUIDED NEEDLE PLC ASPIRATION/INJECTION LOC  11/28/2020   IR IMAGING GUIDED PORT INSERTION  07/11/2018   KNEE CARTILAGE SURGERY Left    Left knee   LEFT HEART CATH AND CORONARY ANGIOGRAPHY N/A 05/25/2021   Procedure: LEFT HEART CATH AND CORONARY ANGIOGRAPHY;  Surgeon: Nelva Bush, MD;  Location: [place] [facility];  Service: Cardiovascular;  Laterality: N/A;   SKIN CANCER EXCISION  12/2014, 04/25/15   TEMPORARY PACEMAKER N/A 05/26/2021   Procedure: TEMPORARY PACEMAKER;  Surgeon: Early Osmond, MD;  Location: [place] [facility];  Service: Cardiovascular;  Laterality: N/A;   VIDEO BRONCHOSCOPY Bilateral 05/09/2018   Procedure: VIDEO BRONCHOSCOPY WITH FLUORO;  Surgeon: Juanito Doom, MD;  Location: [facility];  Service: Cardiopulmonary;  Laterality: Bilateral;   Patient Active Problem List   Diagnosis Date Noted   COPD (chronic obstructive pulmonary  disease) ([place]) 09/22/2021   Back pain 09/22/2021   Fever 06/29/2021   Acute blood loss anemia    Gastrointestinal hemorrhage associated with acute gastritis    Unstable angina ([place]) 05/25/2021   Coronary artery disease involving native coronary artery of native heart without angina pectoris 12/07/2020   SIRS (systemic inflammatory response syndrome) ([place]) 11/27/2020   Encephalitis    Thrombocytopenia ([facility])    Severe sepsis without septic shock ([place]) 11/26/2020   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   Itching 11/17/2019   Dyslipidemia 07/14/2019   Shoulder pain 03/31/2019   Port-A-Cath in place 08/11/2018   Hypothyroidism (acquired) 08/11/2018   Metastasis to lung ([place]) 05/19/2018   Encounter for antineoplastic chemotherapy 05/13/2018   Encounter for antineoplastic immunotherapy 05/13/2018   Goals of care, counseling/discussion 05/13/2018   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[place]) 05/13/2018   Axillary adenopathy 04/24/2018   Right lower lobe lung mass 04/22/2018   Cough with hemoptysis 04/22/2018   Wheezing 04/02/2018   Long-term use of high-risk medication 04/02/2018   Dizziness 06/26/2017   Fatigue 04/30/2016   Insomnia 04/30/2016   Sinusitis 01/31/2016   Hyperlipidemia 01/31/2016   Ear pain 05/09/2015   Nephrolithiasis 04/18/2015   Anemia    Abnormal nuclear stress test    OBESITY, UNSPECIFIED 03/28/2009   Essential hypertension, benign 03/28/2009   PALPITATIONS 03/28/2009   SNORING 03/28/2009    PCP: Francis Dowse, DO  REFERRING PROVIDER: Melina Schools, MD   REFERRING DIAG: M54.50 (ICD-10-CM) - Low back pain, unspecified   Rationale for Evaluation and Treatment Rehabilitation  THERAPY DIAG:  Other low back pain  Muscle weakness (generalized)  Cramp and spasm  Other abnormalities of gait and mobility  ONSET DATE: 03/06/2022 received referral, but pt reports that he has had this pain for  extensive period  SUBJECTIVE:  SUBJECTIVE STATEMENT: In general my RT knee is getting better since last week. Only used iced 3x on my back since I was at PT last.    PERTINENT HISTORY:  Metastatic lung cancer, CAD, gout, spinal stenosis  PAIN:  Are you having pain? Yes: Rt knee 4-5/10 from pool cross over steps.    PRECAUTIONS: None  OCCUPATION: retired Chief Financial Officer  PLOF: Independent and Leisure: going to [employer], yoga classes, outdoor activities  PATIENT GOALS:  To decrease pain to be able to travel again   OBJECTIVE:   DIAGNOSTIC FINDINGS:  Lumbar MRI on 02/15/2022:  Findings: Disc spaces: Degenerative disease with disc height loss at T10-11, T11-12 and L5-S1. Disc desiccation throughout the remainder of the lumbar spine. T12-L1: No significant disc bulge. No neural foraminal stenosis. No central canal stenosis.  L1-L2: No significant disc bulge. No neural foraminal stenosis. No central canal stenosis. Mild bilateral facet arthropathy.  L2-L3: Mild broad-based disc bulge. Mild bilateral facet arthropathy. No foraminal stenosis. Mild spinal stenosis.  L3-L4: Mild broad-based disc bulge. Mild bilateral facet arthropathy. No foraminal or central canal stenosis.  L4-L5: A mild broad-based disc bulge. Moderate bilateral facet arthropathy with ligamentum flavum infolding. Minimal spinal stenosis. No significant foraminal stenosis.  L5-S1: Mild broad-based disc bulge. Mild bilateral facet arthropathy. No foraminal or central canal stenosis.   IMPRESSION: 1. Mild lumbar spine spondylosis as described above.  PATIENT SURVEYS:    04/30/22: 60% FOTO 03/20/2022:  FOTO 53% (projected 58% by visit 11) Modified Oswestry Low Back Pain Disability Questionnaire: 15 / 50 = 30.0 %  SCREENING FOR RED FLAGS: Bowel  or bladder incontinence: No Spinal tumors: No Cauda equina syndrome: No Compression fracture: No Abdominal aneurysm: No  COGNITION:  Overall cognitive status: Within functional limits for tasks assessed     SENSATION: WFL  POSTURE: rounded shoulders and forward head  PALPATION: No tenderness to palpation noted  LUMBAR ROM:    Limited at least 25% throughout with some increased pain   LOWER EXTREMITY MMT:    MMT Right eval Left eval Left 04/30/22 Right 04/30/22  Hip flexion      Hip extension 4 4 4+ 4+  Hip abduction $RemoveBefor'4 4 5 5  'dBvEcPSMEVla$ Hip adduction      Hip internal rotation      Hip external rotation      Knee flexion      Knee extension      Ankle dorsiflexion      Ankle plantarflexion      Ankle inversion      Ankle eversion       (Blank rows = not tested)  FUNCTIONAL TESTS:  03/20/2022: 5 times sit to stand: 13.4 sec without UE use Timed up and go (TUG): 8.4 sec without assistive device  GAIT: Distance walked: 100 ft Assistive device utilized: None Level of assistance: Complete Independence Comments: antalgic gait pattern noted    TODAY'S TREATMENT:   04/30/22: FOTO: see chart MMT: see chart Supine head press/chin tuck 5 sec hold 5x with core contraction; added shoulder press 5 sec 5x with core focus, added arm press to neck/shoulder/& core 5 5x.  Yellow loop clamshells 15x holding Upper 1/4 stable Tiny steps with yellow loop above knee 15x and PPT Supine on blue roll: green band scap unattached 10x each, VC for core & breath   04/23/22:  Supine head press/chin tuck, shoulder press, lower abdominals contract: hold 5 sec 10x. Added to HEP today.  Supine yellow loop clamshells 2x10 VC to  add cervical retraction/shoulder press to exercise.  Supine tiny steps with yellow loop above knee Bil 6x Supine bridge with yellow loop clamshell 10x Supine on blue foam roll: red band diagonals 10x Bil    04/20/22:  Discussed posture (and affects on pain)  and introduced  thoracic ext stretch over back of bench on deck. Pt returned demo with cues.   Pt seen for aquatic therapy today.  Treatment took place in water 3.25-64ft 8" in depth at the [facility] pool. Temp of water was 91.  Pt entered/exited the pool via stairs independently with single rail.  * forward/ backward gait * side stepping with arms abdct/add yellow hand buoys * high knee marching forward/ backward-  cues for upright posture and to not lift knee too high *braiding L/R * bilat shoulder horiz abdct/ add, yellow hand buoys slightly under water, in staggered stance * arms abdct to 90 and trunk rotation to tolerance * Open book in staggered stance holding yellow hand buoys x 6 each side * Warrior 1, lifting noodle off surface towards ceiling x 6 each leg forward * cycling in deeper water with yellow noodle under arms, behind back * back against wall:  hamstring stretch with foot on thin blue square noodle * supported supine to decompress (noodle under arms and legs, nekdoodle) - wears ear plug when supine to keep water out.  Pt requires the buoyancy and hydrostatic pressure of water for support, and to offload joints by unweighting joint load by at least 50 % in navel deep water and by at least 75-80% in chest to neck deep water.  Viscosity of the water is needed for resistance of strengthening. Water current perturbations provides challenge to standing balance requiring increased core activation.     PATIENT EDUCATION:  Education details: Issued HEP Person educated: Patient Education method: Explanation Education comprehension: verbalized understanding and returned demonstration   HOME EXERCISE PROGRAM: Access Code: [email or web address] URL: [email or web address] Date: 04/16/2022 Prepared by: Shelby Dubin Menke  Exercises - Supine Lower Trunk Rotation  - 1 x daily - 7 x weekly - 1 sets - 5 reps - 10 sec hold - Supine Piriformis Stretch with Foot on Ground  - 1 x daily - 7 x  weekly - 1 sets - 2 reps - 20 sec hold - Standing Hamstring Stretch with Step  - 1 x daily - 7 x weekly - 1 sets - 2 reps - 20 sec hold - Hip Flexor Stretch on Step  - 1 x daily - 7 x weekly - 1 sets - 2 reps - 20 sec hold - Cat Cow  - 1 x daily - 7 x weekly - 2 sets - 10 reps - Sidelying Open Book Thoracic Lumbar Rotation and Extension  - 1 x daily - 7 x weekly - 1 sets - 10 reps - Supine March  - 2 x daily - 7 x weekly - 3 sets - 5 reps - Supine Bridge  - 1 x daily - 2 x weekly - 2 sets - 5 reps - 5 hold   ASSESSMENT:  CLINICAL IMPRESSION: Pt reports his back pain significantly improved as is his lat pain. FOTO score and MMT both show improvement. Pt will be out of town beyond his POC and will need to extend it including 10th visit PN which we will today on the 9th visit. Goals are being met.  OBJECTIVE IMPAIRMENTS difficulty walking, decreased strength, increased muscle spasms, impaired flexibility, postural dysfunction, and pain.   ACTIVITY LIMITATIONS  lifting  PARTICIPATION LIMITATIONS: community activity  PERSONAL FACTORS Age, Time since onset of injury/illness/exacerbation, and 3+ comorbidities: metastatic lung cancer, CAD, spinal stenosis  are also affecting patient's functional outcome.   REHAB POTENTIAL: Good  CLINICAL DECISION MAKING: Evolving/moderate complexity  EVALUATION COMPLEXITY: Moderate   GOALS: Goals reviewed with patient? Yes  SHORT TERM GOALS: Target date: 04/10/2022  Pt will be independent with initial HEP. Baseline: Goal status: Goal met 04/09/22  2.  Pt will report pain no greater than 4/10 during daily routine and exercise. Baseline:  Goal status: Goal met 04/30/22 3/10 in general   LONG TERM GOALS: Target date: 05/15/2022  Pt will be independent with advanced HEP. Baseline:  Goal status: goal met 04/23/22  2.  Pt will increase lumbar FOTO to at least 58% to demonstrate improvements in functional mobility. Baseline: 53% Goal status: Goal met  04/30/22 60%  3.  Pt will increase bilateral hip strength to at least 4+ to 5-/5 to allow him to more easily perform functional tasks. Baseline:  Goal status: Goal met: 04/30/22  4.  Pt will report pain no greater than 2/10 during typical daily routine and activities. Baseline:  Goal status: On going 3/10   PLAN: PT FREQUENCY: 2x/week  PT DURATION: 8 weeks  PLANNED INTERVENTIONS: Therapeutic exercises, Therapeutic activity, Neuromuscular re-education, Balance training, Gait training, Patient/Family education, Joint manipulation, Joint mobilization, Stair training, Aquatic Therapy, Dry Needling, Electrical stimulation, Spinal manipulation, Spinal mobilization, Cryotherapy, Moist heat, Taping, Traction, Ultrasound, Ionotophoresis 4mg /ml Dexamethasone, Manual therapy, and Re-evaluation.  PLAN FOR NEXT SESSION: Do 10th V PN today and extend POC to Oct 26. [place] will be [place] of [place].

## 2022-05-03 ENCOUNTER — Ambulatory Visit (HOSPITAL_BASED_OUTPATIENT_CLINIC_OR_DEPARTMENT_OTHER): Payer: Medicare Other | Admitting: Physical Therapy

## 2022-05-04 ENCOUNTER — Ambulatory Visit (HOSPITAL_BASED_OUTPATIENT_CLINIC_OR_DEPARTMENT_OTHER): Payer: Medicare Other | Admitting: Physical Therapy

## 2022-05-07 ENCOUNTER — Encounter: Payer: Medicare Other | Admitting: Physical Therapy

## 2022-05-10 ENCOUNTER — Ambulatory Visit (HOSPITAL_BASED_OUTPATIENT_CLINIC_OR_DEPARTMENT_OTHER): Payer: Medicare Other | Admitting: Physical Therapy

## 2022-05-11 ENCOUNTER — Telehealth: Payer: Self-pay | Admitting: Cardiology

## 2022-05-11 ENCOUNTER — Ambulatory Visit (HOSPITAL_BASED_OUTPATIENT_CLINIC_OR_DEPARTMENT_OTHER): Payer: Medicare Other | Admitting: Physical Therapy

## 2022-05-11 MED ORDER — CARVEDILOL 25 MG PO TABS: 25.0000 mg | ORAL_TABLET | Freq: Two times a day (BID) | ORAL | 0 refills | Status: DC

## 2022-05-11 NOTE — Telephone Encounter (Signed)
Returned call to patient who states that he is out of town and almost out of his medication Carvedilol. He needs 30 day supply sent to pharmacy listed. Advised patient that this has been sent in and to call back with any concerns. Patient verbalized understanding.

## 2022-05-11 NOTE — Telephone Encounter (Signed)
Pt c/o medication issue:  1. Name of Medication: carvedilol (COREG) 25 MG tablet  2. How are you currently taking this medication (dosage and times per day)? As prescribed   3. Are you having a reaction (difficulty breathing--STAT)? No  4. What is your medication issue? Pt is out of town and believes he doesn't have enough medication to last him and was wanting to know if this could be called into: [id number] - [place]

## 2022-05-17 ENCOUNTER — Ambulatory Visit (HOSPITAL_BASED_OUTPATIENT_CLINIC_OR_DEPARTMENT_OTHER): Payer: Medicare Other | Admitting: Physical Therapy

## 2022-05-18 ENCOUNTER — Ambulatory Visit (HOSPITAL_BASED_OUTPATIENT_CLINIC_OR_DEPARTMENT_OTHER): Payer: Medicare Other | Admitting: Physical Therapy

## 2022-05-21 ENCOUNTER — Encounter: Payer: Medicare Other | Admitting: Rehabilitative and Restorative Service Providers"

## 2022-05-21 DIAGNOSIS — I77819 Aortic ectasia, unspecified site: Secondary | ICD-10-CM | POA: Insufficient documentation

## 2022-05-21 NOTE — Progress Notes (Unsigned)
Cardiology Office Note   Date:  05/23/2022   ID:  Travis, Padilla [birth year], MRN [id number]  PCP:  Travis Norlander, DO  Cardiologist:   Travis Breeding, MD   Chief Complaint  Patient presents with   Leg Pain       History of Present Illness: Travis Padilla is a 76 y.o. male who presents for follow-up of coronary artery disease.Outpatient cardiac catheterization was scheduled.he had chest discomfort in 2022.  He underwent LHC 05/25/2021 which showed 80% mid LAD (chronic total occlusion) and sequential 70-95% proximal mid RCA lesion.  He received DES x1 to his LAD.  He had staged PCI 05/26/2021 with successful coronary arthrectomy and stenting of high-grade calcified lesions of proximal-mid RCA.  After the procedure he was noted to have anemia due to GI bleed.  His hemoglobin went from 12 down to 7.9 on 9/17.  He was noted to be FOBT positive.  His dual antiplatelet therapy was briefly held in setting of concern for cardiogenic shock.  He was bridged with cangrelor.  He received 3 units of PRBCs.  He was evaluated by GI and underwent EGD which showed erosive gastropathy with no active bleeding.  It was felt that it was his prior source of melena.  His H. pylori was negative.  He received IV iron prior to discharge.  Supplemental iron was added to his medication regimen.  There were recommendations to continue on antiplatelet therapy.  He was continued on Plavix without aspirin.  Recommendation for Protonix 40 mg daily was also made. He had a a monitor by his primary provider recently because of palpitations and had NSR with some runs of NSVT.    Since I last saw him he went to [place].  This was a few weeks ago.  He actually said he when he got there he had some leg discomfort.  He does not remember trauma or any insect bites but he developed a knot on his calf and he had ecchymosis and deep discomfort in his calf with swelling.  Today he does have some ecchymosis still evident  but the pain is improving.  He did not go to seek medical help.  He did not have any shortness of breath.  He otherwise did well and was able to hike without any chest pressure, neck or arm discomfort.  He was not having any new shortness of breath, PND or orthopnea.  He had no palpitations, presyncope or syncope.    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) [facility]) dx'd 04/17/18   to LN, infrahilar mass, lung nodule and lt axilla   Obesity, unspecified 03/28/2009   Qualifier: Diagnosis of  By: [name] MD, [name]     Palpitations 03/28/2009   Qualifier: Diagnosis of  By: [name], MD, [name]     Right lower lobe lung mass 04/22/2018   Skin cancer  Sleep apnea    SNORING 03/28/2009   Qualifier: Diagnosis of  By: [name], MD, [name]     Spinal stenosis    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    Past Surgical History:  Procedure Laterality Date   basal skin cancer N/A 2019   Nose   BELPHAROPTOSIS REPAIR Bilateral    CARDIAC CATHETERIZATION     CATARACT EXTRACTION W/ INTRAOCULAR LENS  IMPLANT, BILATERAL     COLONOSCOPY N/A 11/03/2014   Procedure: COLONOSCOPY;  Surgeon: Rogene Houston, MD;  [facility]: [facility];  Service: Endoscopy;  Laterality: N/A;  1225   CORONARY ATHERECTOMY N/A 05/26/2021   Procedure: CORONARY ATHERECTOMY;  Surgeon: Early Osmond, MD;  Location: [place] [facility];  Service: Cardiovascular;   Laterality: N/A;   CORONARY STENT INTERVENTION N/A 05/25/2021   Procedure: CORONARY STENT INTERVENTION;  Surgeon: Nelva Bush, MD;  Location: [place] [facility];  Service: Cardiovascular;  Laterality: N/A;   CORONARY STENT INTERVENTION N/A 05/26/2021   Procedure: CORONARY STENT INTERVENTION;  Surgeon: Early Osmond, MD;  Location: [place] [facility];  Service: Cardiovascular;  Laterality: N/A;   ESOPHAGOGASTRODUODENOSCOPY (EGD) WITH PROPOFOL N/A 05/29/2021   Procedure: ESOPHAGOGASTRODUODENOSCOPY (EGD) WITH PROPOFOL;  Surgeon: Gatha Mayer, MD;  Location: [place];  Service: Endoscopy;  Laterality: N/A;   INTRAVASCULAR ULTRASOUND/IVUS N/A 05/26/2021   Procedure: Intravascular Ultrasound/IVUS;  Surgeon: Early Osmond, MD;  Location: [place] [facility];  Service: Cardiovascular;  Laterality: N/A;   IR CV LINE INJECTION  08/24/2020   IR FLUORO GUIDED NEEDLE PLC ASPIRATION/INJECTION LOC  11/28/2020   IR IMAGING GUIDED PORT INSERTION  07/11/2018   KNEE CARTILAGE SURGERY Left    Left knee   LEFT HEART CATH AND CORONARY ANGIOGRAPHY N/A 05/25/2021   Procedure: LEFT HEART CATH AND CORONARY ANGIOGRAPHY;  Surgeon: Nelva Bush, MD;  Location: [place] [facility];  Service: Cardiovascular;  Laterality: N/A;   SKIN CANCER EXCISION  12/2014, 04/25/15   TEMPORARY PACEMAKER N/A 05/26/2021   Procedure: TEMPORARY PACEMAKER;  Surgeon: Early Osmond, MD;  Location: [place] [facility];  Service: Cardiovascular;  Laterality: N/A;   VIDEO BRONCHOSCOPY Bilateral 05/09/2018   Procedure: VIDEO BRONCHOSCOPY WITH FLUORO;  Surgeon: Juanito Doom, MD;  Location: [facility];  Service: Cardiopulmonary;  Laterality: Bilateral;     Current Outpatient Medications  Medication Sig Dispense Refill   acetaminophen (TYLENOL) 500 MG tablet Take 1,000 mg by mouth every 6 (six) hours as needed.     albuterol (VENTOLIN HFA) 108 (90 Base) MCG/ACT inhaler Inhale 2 puffs into the lungs every 6 (six) hours as  needed for wheezing or shortness of breath. 8 g 6   ALPRAZolam (XANAX) 0.25 MG tablet Take 1 tablet (0.25 mg total) by mouth at bedtime as needed for anxiety. 30 tablet 0   Artificial Tear Solution (SOOTHE XP OP) Place 1 drop into both eyes 2 (two) times daily.     carvedilol (COREG) 25 MG tablet Take 1 tablet (25 mg total) by mouth 2 (two) times daily with a meal. 60 tablet 0   clopidogrel (PLAVIX) 75 MG tablet Take 1 tablet (75 mg total) by mouth daily. 90 tablet 2   [employer] IRON 325 (65 Fe) MG tablet TAKE 1 TABLET BY MOUTH DAILY 90 tablet 1   fluticasone (FLONASE) 50 MCG/ACT nasal spray Place 1 spray into both nostrils daily. (Patient taking differently: Place 1 spray into both nostrils daily as needed for allergies.)  16 g 3   levothyroxine (SYNTHROID) 175 MCG tablet TAKE 1 TABLET BY MOUTH EVERY DAY 90 tablet 0   methocarbamol (ROBAXIN) 500 MG tablet Take 500 mg by mouth every 6 (six) hours as needed for muscle spasms.     nitroGLYCERIN (NITROSTAT) 0.4 MG SL tablet Place 1 tablet (0.4 mg total) under the tongue every 5 (five) minutes as needed for chest pain. 25 tablet 2   pantoprazole (PROTONIX) 40 MG tablet Take 1 tablet (40 mg total) by mouth daily before breakfast. 90 tablet 2   polyethylene glycol powder (GLYCOLAX/MIRALAX) 17 GM/SCOOP powder Take 1 Container by mouth daily.     PRALUENT 150 MG/ML SOAJ INJECT 150 MG INTO THE SKIN EVERY 14 (FOURTEEN) DAYS. 6 mL 3   senna-docusate (SENNA S) 8.6-50 MG tablet 1 to 2 twice daily for constipation 120 tablet 5   tamsulosin (FLOMAX) 0.4 MG CAPS capsule Take 0.4 mg by mouth at bedtime.     diphenhydrAMINE (BENADRYL) 50 MG tablet Take 1 tablet (50 mg total) by mouth once for 1 dose. Pt to take 50 mg of benadryl on 10/20/21 at 12:00 PM (noon). Please call [phone] with any questions. 1 tablet 0   predniSONE (DELTASONE) 50 MG tablet Pt to take 50 mg of prednisone on 10/20/21 at 12:00 AM (midnight), 50 mg of prednisone on 10/20/21 at 6:00 AM, and 50 mg of  prednisone on 10/20/21 at 12:00 PM (noon). Pt is also to take 50 mg of benadryl on 10/20/21 at 12:00 PM (noon). Please call [phone] with any questions. (Patient not taking: Reported on 01/18/2022) 3 tablet 0   No current facility-administered medications for this visit.    Allergies:   Iohexol, Pravastatin, and Iodine    ROS:  Please see the history of present illness.   Otherwise, review of systems are positive for none.   All other systems are reviewed and negative.    PHYSICAL EXAM: VS:  BP 110/70   Pulse 72   Ht 6\' 2"  (1.88 m)   Wt 233 lb (105.7 kg)   BMI 29.92 kg/m  , BMI Body mass index is 29.92 kg/m. GENERAL:  Well appearing NECK:  No jugular venous distention, waveform within normal limits, carotid upstroke brisk and symmetric, no bruits, no thyromegaly LUNGS:  Clear to auscultation bilaterally CHEST:  Unremarkable HEART:  PMI not displaced or sustained,S1 and S2 within normal limits, no S3, no S4, no clicks, no rubs, no murmurs ABD:  Flat, positive bowel sounds normal in frequency in pitch, no bruits, no rebound, no guarding, no midline pulsatile mass, no hepatomegaly, no splenomegaly EXT:  2 plus pulses throughout, no edema, no cyanosis no clubbing, right leg echymosis.     EKG:  EKG not ordered today.   Recent Labs: 05/30/2021: Magnesium 2.0 04/17/2022: ALT 13; Hemoglobin 14.0; Platelet Count 170; TSH 0.100 04/18/2022: BUN 19; Creatinine, Ser 1.22; Potassium 4.3; Sodium 141    Lipid Panel    Component Value Date/Time   CHOL 123 05/24/2021 1151   TRIG 191 (H) 05/24/2021 1151   HDL 38 (L) 05/24/2021 1151   CHOLHDL 3.2 05/24/2021 1151   LDLCALC 54 05/24/2021 1151      Wt Readings from Last 3 Encounters:  05/23/22 233 lb (105.7 kg)  04/19/22 231 lb 1 oz (104.8 kg)  04/18/22 230 lb 9.6 oz (104.6 kg)      Other studies Reviewed: Additional studies/ records that were reviewed today include: None Review of the above records demonstrates:  Please see  elsewhere  in the note.     ASSESSMENT AND PLAN:   CAD/Unstable Angina: The patient has no new sypmtoms.  No further cardiovascular testing is indicated.  We will continue with aggressive risk reduction and meds as listed.  HTN:    The blood pressure is at target.  No change in therapy.   HLD:   LDL was 54.  HDL was 38.  No change in therapy.   COPD/OSA:   He has not wanted to wear CPAP.  No change in therapy.   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  I will follow a couple years with repeat CT.  Palpitations:   He had NSVT.  He is not having any new palpitations.  No change in therapy.   Calf pain: I will check venous Doppler on the left   Current medicines are reviewed at length with the patient today.   No change  The following changes have been made:   None  Labs/ tests ordered today include:   Orders Placed This Encounter  Procedures   VAS [place] LOWER EXTREMITY VENOUS (DVT)     Disposition:   FU with 12 months   Signed, Travis Breeding, MD  05/23/2022 11:41 AM    [place]

## 2022-05-23 ENCOUNTER — Encounter: Payer: Self-pay | Admitting: Cardiology

## 2022-05-23 ENCOUNTER — Ambulatory Visit (INDEPENDENT_AMBULATORY_CARE_PROVIDER_SITE_OTHER): Payer: Medicare Other | Admitting: Cardiology

## 2022-05-23 ENCOUNTER — Encounter: Payer: Self-pay | Admitting: Rehabilitative and Restorative Service Providers"

## 2022-05-23 ENCOUNTER — Ambulatory Visit: Payer: Medicare Other | Attending: Orthopedic Surgery | Admitting: Rehabilitative and Restorative Service Providers"

## 2022-05-23 VITALS — BP 110/70 | HR 72 | Ht 74.0 in | Wt 233.0 lb

## 2022-05-23 DIAGNOSIS — R262 Difficulty in walking, not elsewhere classified: Secondary | ICD-10-CM | POA: Diagnosis not present

## 2022-05-23 DIAGNOSIS — E785 Hyperlipidemia, unspecified: Secondary | ICD-10-CM

## 2022-05-23 DIAGNOSIS — M5459 Other low back pain: Secondary | ICD-10-CM | POA: Insufficient documentation

## 2022-05-23 DIAGNOSIS — R252 Cramp and spasm: Secondary | ICD-10-CM | POA: Diagnosis not present

## 2022-05-23 DIAGNOSIS — I2510 Atherosclerotic heart disease of native coronary artery without angina pectoris: Secondary | ICD-10-CM

## 2022-05-23 DIAGNOSIS — R2689 Other abnormalities of gait and mobility: Secondary | ICD-10-CM | POA: Insufficient documentation

## 2022-05-23 DIAGNOSIS — I77819 Aortic ectasia, unspecified site: Secondary | ICD-10-CM | POA: Diagnosis not present

## 2022-05-23 DIAGNOSIS — M6281 Muscle weakness (generalized): Secondary | ICD-10-CM | POA: Diagnosis not present

## 2022-05-23 DIAGNOSIS — M7989 Other specified soft tissue disorders: Secondary | ICD-10-CM | POA: Diagnosis not present

## 2022-05-23 DIAGNOSIS — M25571 Pain in right ankle and joints of right foot: Secondary | ICD-10-CM | POA: Diagnosis not present

## 2022-05-23 DIAGNOSIS — I10 Essential (primary) hypertension: Secondary | ICD-10-CM | POA: Diagnosis not present

## 2022-05-23 DIAGNOSIS — M79662 Pain in left lower leg: Secondary | ICD-10-CM

## 2022-05-23 DIAGNOSIS — M25561 Pain in right knee: Secondary | ICD-10-CM | POA: Insufficient documentation

## 2022-05-23 DIAGNOSIS — M25671 Stiffness of right ankle, not elsewhere classified: Secondary | ICD-10-CM | POA: Insufficient documentation

## 2022-05-23 NOTE — Patient Instructions (Addendum)
Medication Instructions:  The current medical regimen is effective;  continue present plan and medications.  *If you need a refill on your cardiac medications before your next appointment, please call your pharmacy*  Testing/Procedures: Your physician has requested that you have a lower extremity venous duplex. This test is an ultrasound of the veins in the legs. It looks at venous blood flow that carries blood from the heart to the legs. Allow one hour for a Lower Venous exam. Allow thirty minutes for an Upper Venous exam. There are no restrictions or special instructions.  [facility] office - Thursday September 14 - 11:15 am  Follow-Up: At [facility], you and your health needs are our priority.  As part of our continuing mission to provide you with exceptional heart care, we have created designated Provider Care Teams.  These Care Teams include your primary Cardiologist (physician) and Advanced Practice Providers (APPs -  Physician Assistants and Nurse Practitioners) who all work together to provide you with the care you need, when you need it.  We recommend signing up for the patient portal called "MyChart".  Sign up information is provided on this After Visit Summary.  MyChart is used to connect with patients for Virtual Visits (Telemedicine).  Patients are able to view lab/test results, encounter notes, upcoming appointments, etc.  Non-urgent messages can be sent to your provider as well.   To learn more about what you can do with MyChart, go to [email or web address].    Your next appointment:   1 year(s)  The format for your next appointment:   In Person  Provider:   [name], MD     Important Information About Sugar

## 2022-05-23 NOTE — Therapy (Signed)
OUTPATIENT PHYSICAL THERAPY REASSESSMENT/TREATMENT NOTE   Patient Name: Travis Padilla MRN: [id number] DOB:09-Apr-1946, 76 y.o., male Today's Date: 05/23/2022    Progress Note Reporting Period 03/20/2022 to 05/23/2022  See note below for Objective Data and Assessment of Progress/Goals.       PT End of Session - 05/23/22 0854     Visit Number 10    Date for PT Re-Evaluation 07/13/22    Authorization Type Med A    Progress Note Due on Visit 20    PT Start Time 0845    PT Stop Time 0925    PT Time Calculation (min) 40 min    Activity Tolerance Patient tolerated treatment well    Behavior During Therapy [facility] for tasks assessed/performed              Past Medical History:  Diagnosis Date   Abnormal nuclear stress test    December, 2013   Anemia    Axillary adenopathy 04/24/2018   CAD (coronary artery disease)    Mild nonobstructive plaque in cath 2013   Chest pain    December, 2013   Cough with hemoptysis 04/22/2018   Dizziness    Dyslipidemia 07/14/2019   Encounter for antineoplastic immunotherapy 05/13/2018   Gout    Hemorrhoid    Hyperlipidemia    Hypertension    Hypothyroidism (acquired) 08/11/2018   Metastasis to lung ([place]) 05/19/2018   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) ([place]) dx'd 04/17/18   to LN, infrahilar mass, lung nodule and lt axilla   Obesity, unspecified 03/28/2009   Qualifier: Diagnosis of  By: [name], MD, [name]     Palpitations 03/28/2009   Qualifier: Diagnosis of  By: [name], MD, [name]     Right lower lobe lung mass 04/22/2018   Skin cancer    Sleep apnea    SNORING 03/28/2009   Qualifier: Diagnosis of  By: [name], MD, [name]     Spinal stenosis    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   Past Surgical History:  Procedure Laterality Date   basal skin cancer N/A 2019   Nose   BELPHAROPTOSIS REPAIR Bilateral    CARDIAC CATHETERIZATION     CATARACT EXTRACTION W/  INTRAOCULAR LENS  IMPLANT, BILATERAL     COLONOSCOPY N/A 11/03/2014   Procedure: COLONOSCOPY;  Surgeon: Rogene Houston, MD;  [facility];  Service: Endoscopy;  Laterality: N/A;  1225   CORONARY ATHERECTOMY N/A 05/26/2021   Procedure: CORONARY ATHERECTOMY;  Surgeon: Early Osmond, MD;  Location: [place] CV LAB;  Service: Cardiovascular;  Laterality: N/A;   CORONARY STENT INTERVENTION N/A 05/25/2021   Procedure: CORONARY STENT INTERVENTION;  Surgeon: Nelva Bush, MD;  Location: [place] CV LAB;  Service: Cardiovascular;  Laterality: N/A;   CORONARY STENT INTERVENTION N/A 05/26/2021   Procedure: CORONARY STENT INTERVENTION;  Surgeon: Early Osmond, MD;  Location: [place] CV LAB;  Service: Cardiovascular;  Laterality: N/A;   ESOPHAGOGASTRODUODENOSCOPY (EGD) WITH PROPOFOL N/A 05/29/2021   Procedure: ESOPHAGOGASTRODUODENOSCOPY (EGD) WITH PROPOFOL;  Surgeon: Gatha Mayer, MD;  Location: [place];  Service: Endoscopy;  Laterality: N/A;   INTRAVASCULAR ULTRASOUND/IVUS N/A 05/26/2021   Procedure: Intravascular Ultrasound/IVUS;  Surgeon: Early Osmond, MD;  Location: [place] CV LAB;  Service: Cardiovascular;  Laterality: N/A;   IR CV LINE INJECTION  08/24/2020   IR FLUORO GUIDED NEEDLE PLC ASPIRATION/INJECTION LOC  11/28/2020   IR IMAGING GUIDED PORT INSERTION  07/11/2018   KNEE CARTILAGE SURGERY Left    Left knee   LEFT HEART CATH AND CORONARY ANGIOGRAPHY N/A 05/25/2021   Procedure: LEFT HEART CATH AND CORONARY ANGIOGRAPHY;  Surgeon: Nelva Bush, MD;  Location: [place] CV LAB;  Service: Cardiovascular;  Laterality: N/A;   SKIN CANCER EXCISION  12/2014, 04/25/15   TEMPORARY PACEMAKER N/A 05/26/2021   Procedure: TEMPORARY PACEMAKER;  Surgeon: Early Osmond, MD;  Location: [place] CV LAB;  Service: Cardiovascular;  Laterality: N/A;   VIDEO BRONCHOSCOPY Bilateral 05/09/2018   Procedure: VIDEO BRONCHOSCOPY WITH FLUORO;  Surgeon: Juanito Doom, MD;   Location: [facility];  Service: Cardiopulmonary;  Laterality: Bilateral;   Patient Active Problem List   Diagnosis Date Noted   Acquired dilation of ascending aorta and aortic root ([place]) 05/21/2022   COPD (chronic obstructive pulmonary disease) ([place]) 09/22/2021   Back pain 09/22/2021   Fever 06/29/2021   Acute blood loss anemia    Gastrointestinal hemorrhage associated with acute gastritis    Unstable angina (HCC) 05/25/2021   Coronary artery disease involving native coronary artery of native heart without angina pectoris 12/07/2020   SIRS (systemic inflammatory response syndrome) ([place]) 11/27/2020   Encephalitis    Thrombocytopenia (HCC)    Severe sepsis without septic shock ([place]) 11/26/2020   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   Itching 11/17/2019   Dyslipidemia 07/14/2019   Shoulder pain 03/31/2019   Port-A-Cath in place 08/11/2018   Hypothyroidism (acquired) 08/11/2018   Metastasis to lung ([place]) 05/19/2018   Encounter for antineoplastic chemotherapy 05/13/2018   Encounter for antineoplastic immunotherapy 05/13/2018   Goals of care, counseling/discussion 05/13/2018   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[place]) 05/13/2018   Axillary adenopathy 04/24/2018   Right lower lobe lung mass 04/22/2018   Cough with hemoptysis 04/22/2018   Wheezing 04/02/2018   Long-term use of high-risk medication 04/02/2018   Dizziness 06/26/2017   Fatigue 04/30/2016   Insomnia 04/30/2016   Sinusitis 01/31/2016   Hyperlipidemia 01/31/2016   Ear pain 05/09/2015   Nephrolithiasis 04/18/2015   Anemia    Abnormal nuclear stress test    OBESITY, UNSPECIFIED 03/28/2009   Essential hypertension, benign 03/28/2009   PALPITATIONS 03/28/2009   SNORING 03/28/2009    PCP: Francis Dowse, DO  REFERRING PROVIDER: Melina Schools, MD   REFERRING DIAG: M54.50 (ICD-10-CM) - Low back pain, unspecified   Rationale for Evaluation and  Treatment Rehabilitation  THERAPY DIAG:  Other low back pain  Muscle weakness (generalized)  Cramp and spasm  Other abnormalities of gait and mobility  Right knee pain, unspecified chronicity  ONSET DATE: 03/06/2022 received referral, but pt reports that he has had this pain for extensive period  SUBJECTIVE:  SUBJECTIVE STATEMENT: Pt reports that his back is feeling better, but does state that his right knee has been giving him trouble since one of his last aquatic PT appointments.  Pt reports that he had a nice time on his vacation.   PERTINENT HISTORY:  Metastatic lung cancer, CAD, gout, spinal stenosis  PAIN:  Are you having pain? Yes: Rt knee 5/10, back 2/10, denies pain in thoracic spine   PRECAUTIONS: None  OCCUPATION: retired Chief Financial Officer  PLOF: Independent and Leisure: going to [employer], yoga classes, outdoor activities  PATIENT GOALS:  To decrease pain to be able to travel again   OBJECTIVE:   DIAGNOSTIC FINDINGS:  Lumbar MRI on 02/15/2022:  Findings: Disc spaces: Degenerative disease with disc height loss at T10-11, T11-12 and L5-S1. Disc desiccation throughout the remainder of the lumbar spine. T12-L1: No significant disc bulge. No neural foraminal stenosis. No central canal stenosis.  L1-L2: No significant disc bulge. No neural foraminal stenosis. No central canal stenosis. Mild bilateral facet arthropathy.  L2-L3: Mild broad-based disc bulge. Mild bilateral facet arthropathy. No foraminal stenosis. Mild spinal stenosis.  L3-L4: Mild broad-based disc bulge. Mild bilateral facet arthropathy. No foraminal or central canal stenosis.  L4-L5: A mild broad-based disc bulge. Moderate bilateral facet arthropathy with ligamentum flavum infolding. Minimal spinal stenosis. No significant  foraminal stenosis.  L5-S1: Mild broad-based disc bulge. Mild bilateral facet arthropathy. No foraminal or central canal stenosis.   IMPRESSION: 1. Mild lumbar spine spondylosis as described above.  PATIENT SURVEYS:    03/20/2022:   FOTO 53% (projected 58% by visit 11) Modified Oswestry Low Back Pain Disability Questionnaire: 15 / 50 = 30.0 %  04/30/22: FOTO  60%  05/23/2022: FOTO 60%   SCREENING FOR RED FLAGS: Bowel or bladder incontinence: No Spinal tumors: No Cauda equina syndrome: No Compression fracture: No Abdominal aneurysm: No  COGNITION:  Overall cognitive status: Within functional limits for tasks assessed     SENSATION: WFL  POSTURE: rounded shoulders and forward head  PALPATION: No tenderness to palpation noted  LUMBAR ROM:    Limited at least 25% throughout with some increased pain   LOWER EXTREMITY MMT:    MMT Right eval Left eval Left 04/30/22 Right 04/30/22  Hip flexion      Hip extension 4 4 4+ 4+  Hip abduction _0 Hip adduction      Hip internal rotation      Hip external rotation      Knee flexion      Knee extension      Ankle dorsiflexion      Ankle plantarflexion      Ankle inversion      Ankle eversion       (Blank rows = not tested)  FUNCTIONAL TESTS:  03/20/2022: 5 times sit to stand: 13.4 sec without UE use Timed up and go (TUG): 8.4 sec without assistive device  05/23/2022: 5 times sit to stand: 11.2 sec without UE use  GAIT: Distance walked: 1000 ft Assistive device utilized: None Level of assistance: Complete Independence Comments: antalgic gait pattern noted    TODAY'S TREATMENT:   05/23/2022: Nustep level 4 x7 minutes with PT present to discuss status FOTO 60% Sit to/from stand x5 reps Standing hamstring stretch at step 2x20 sec bilat Standing hip flexor stretch at step 2x20 sec bilat Alt between modified downward dog/counter stretch to modified cobra/back extension at barre 2x20 sec each Seated  hamstring curl with green tband 2x10 bilat Seated LAQ  with ball sqeeze and 3# 2x10 bilat   04/30/22: FOTO: see chart MMT: see chart Supine head press/chin tuck 5 sec hold 5x with core contraction; added shoulder press 5 sec 5x with core focus, added arm press to neck/shoulder/& core 5 5x.  Yellow loop clamshells 15x holding Upper 1/4 stable Tiny steps with yellow loop above knee 15x and PPT Supine on blue roll: green band scap unattached 10x each, VC for core & breath   04/23/22:  Supine head press/chin tuck, shoulder press, lower abdominals contract: hold 5 sec 10x. Added to HEP today.  Supine yellow loop clamshells 2x10 VC to add cervical retraction/shoulder press to exercise.  Supine tiny steps with yellow loop above knee Bil 6x Supine bridge with yellow loop clamshell 10x Supine on blue foam roll: red band diagonals 10x Bil      PATIENT EDUCATION:  Education details: Issued HEP Person educated: Patient Education method: Explanation Education comprehension: verbalized understanding and returned demonstration   HOME EXERCISE PROGRAM: Access Code: [email or web address] URL: [email or web address] Date: 04/16/2022 Prepared by: Shelby Dubin Eunice Winecoff  Exercises - Supine Lower Trunk Rotation  - 1 x daily - 7 x weekly - 1 sets - 5 reps - 10 sec hold - Supine Piriformis Stretch with Foot on Ground  - 1 x daily - 7 x weekly - 1 sets - 2 reps - 20 sec hold - Standing Hamstring Stretch with Step  - 1 x daily - 7 x weekly - 1 sets - 2 reps - 20 sec hold - Hip Flexor Stretch on Step  - 1 x daily - 7 x weekly - 1 sets - 2 reps - 20 sec hold - Cat Cow  - 1 x daily - 7 x weekly - 2 sets - 10 reps - Sidelying Open Book Thoracic Lumbar Rotation and Extension  - 1 x daily - 7 x weekly - 1 sets - 10 reps - Supine March  - 2 x daily - 7 x weekly - 3 sets - 5 reps - Supine Bridge  - 1 x daily - 2 x weekly - 2 sets - 5 reps - 5 hold   ASSESSMENT:  CLINICAL IMPRESSION: Mr Gurski is progressing  towards goal related activities.  He is reporting great improvements in his back, but states that has still not been able to return to playing pickle ball.  Pt has made progress in objective testing, but has not met his max potential.  Pt additionally is still having some right knee pain lingering from a pool session where he was performing cross-over steps.  Pt has met some of his goals, but has not yet met them and continues to require skilled PT for an additional 1-2x/week for 8 weeks.      OBJECTIVE IMPAIRMENTS difficulty walking, decreased strength, increased muscle spasms, impaired flexibility, postural dysfunction, and pain.   ACTIVITY LIMITATIONS lifting  PARTICIPATION LIMITATIONS: community activity  PERSONAL FACTORS Age, Time since onset of injury/illness/exacerbation, and 3+ comorbidities: metastatic lung cancer, CAD, spinal stenosis  are also affecting patient's functional outcome.   REHAB POTENTIAL: Good  CLINICAL DECISION MAKING: Evolving/moderate complexity  EVALUATION COMPLEXITY: Moderate   GOALS: Goals reviewed with patient? Yes  SHORT TERM GOALS: Target date: 04/10/2022  Pt will be independent with initial HEP. Baseline: Goal status: Goal met 04/09/22  2.  Pt will report pain no greater than 4/10 during daily routine and exercise. Baseline:  Goal status: Goal met 04/30/22 3/10 in general   LONG TERM GOALS: Target date: 07/13/2022  Pt will be independent with advanced HEP. Baseline:  Goal status: IN PROGRESS  2.  Pt will increase lumbar FOTO to at least 58% to demonstrate improvements in functional mobility. Baseline: 53% Goal status: Goal met 04/30/22 60%  3.  Pt will increase bilateral hip strength to at least 5-/5 to allow him to more easily perform functional tasks. Baseline:  Goal status: IN PROGRESS  4.  Pt will report pain no greater than 2/10 during typical daily routine and activities. Baseline: on 9/13 reports right knee pain of 5/10 Goal status:  IN PROGRESS  5.  Pt will report being able to return to playing pickle ball with pain no greater than 4/10 following.  Baseline:  R knee pain 5/10  Goal status:  INITIAL   PLAN: PT FREQUENCY: 1-2x/week  PT DURATION: 8 weeks  PLANNED INTERVENTIONS: Therapeutic exercises, Therapeutic activity, Neuromuscular re-education, Balance training, Gait training, Patient/Family education, Joint manipulation, Joint mobilization, Stair training, Aquatic Therapy, Dry Needling, Electrical stimulation, Spinal manipulation, Spinal mobilization, Cryotherapy, Moist heat, Taping, Traction, Ultrasound, Ionotophoresis 37m/ml Dexamethasone, Manual therapy, and Re-evaluation.  PLAN FOR NEXT SESSION: assess and progress HEP as indicated, aquatics as needed, strengthening, manual therapy/dry needling as indicated   SJuel Burrow PT 05/23/22 9:24 AM  [place] [place] [place] [id number] # [phone] [phone]

## 2022-05-24 ENCOUNTER — Ambulatory Visit (HOSPITAL_BASED_OUTPATIENT_CLINIC_OR_DEPARTMENT_OTHER): Payer: Medicare Other | Attending: Orthopedic Surgery | Admitting: Physical Therapy

## 2022-05-24 ENCOUNTER — Telehealth (HOSPITAL_BASED_OUTPATIENT_CLINIC_OR_DEPARTMENT_OTHER): Payer: Self-pay | Admitting: Physical Therapy

## 2022-05-24 ENCOUNTER — Ambulatory Visit (HOSPITAL_COMMUNITY)
Admission: RE | Admit: 2022-05-24 | Discharge: 2022-05-24 | Disposition: A | Discharge status: Home / Self Care | Payer: Medicare Other | Source: Ambulatory Visit | Attending: Cardiology | Admitting: Cardiology

## 2022-05-24 DIAGNOSIS — M7989 Other specified soft tissue disorders: Secondary | ICD-10-CM | POA: Diagnosis not present

## 2022-05-24 DIAGNOSIS — M79662 Pain in left lower leg: Secondary | ICD-10-CM | POA: Insufficient documentation

## 2022-05-24 NOTE — Telephone Encounter (Signed)
Patient did not show to Aquatic PT appointment.  Called and spoke to patient regarding missed session. He apologized about missing appointment; he reported he "has an ultrasound scheduled by his heart doctor" and had that on his mind.  Confirmed next upcoming appt with him.    Kerin Perna, PTA 05/24/22 10:23 AM [facility] [address] [place], [place], [address] Phone: [phone]   Fax:  [phone]

## 2022-05-25 ENCOUNTER — Ambulatory Visit (HOSPITAL_BASED_OUTPATIENT_CLINIC_OR_DEPARTMENT_OTHER): Payer: Medicare Other | Admitting: Physical Therapy

## 2022-05-28 DIAGNOSIS — M25561 Pain in right knee: Secondary | ICD-10-CM | POA: Diagnosis not present

## 2022-05-30 ENCOUNTER — Ambulatory Visit: Payer: Medicare Other | Admitting: Cardiology

## 2022-05-31 ENCOUNTER — Encounter (HOSPITAL_BASED_OUTPATIENT_CLINIC_OR_DEPARTMENT_OTHER): Payer: Self-pay | Admitting: Physical Therapy

## 2022-05-31 ENCOUNTER — Encounter: Payer: Self-pay | Admitting: *Deleted

## 2022-05-31 ENCOUNTER — Ambulatory Visit (HOSPITAL_BASED_OUTPATIENT_CLINIC_OR_DEPARTMENT_OTHER): Payer: Medicare Other | Admitting: Physical Therapy

## 2022-05-31 DIAGNOSIS — R252 Cramp and spasm: Secondary | ICD-10-CM | POA: Diagnosis not present

## 2022-05-31 DIAGNOSIS — R2689 Other abnormalities of gait and mobility: Secondary | ICD-10-CM | POA: Insufficient documentation

## 2022-05-31 DIAGNOSIS — M6281 Muscle weakness (generalized): Secondary | ICD-10-CM | POA: Insufficient documentation

## 2022-05-31 DIAGNOSIS — M5459 Other low back pain: Secondary | ICD-10-CM

## 2022-05-31 NOTE — Therapy (Signed)
OUTPATIENT PHYSICAL THERAPY TREATMENT NOTE   Patient Name: Travis Padilla MRN: [id number] DOB:11/27/1945, 76 y.o., male Today's Date: 05/31/2022     PT End of Session - 05/31/22 0950     Visit Number 11    Date for PT Re-Evaluation 07/13/22    Authorization Type Med A    Progress Note Due on Visit 44    PT Start Time 0940    PT Stop Time 1022    PT Time Calculation (min) 42 min              Past Medical History:  Diagnosis Date   Abnormal nuclear stress test    December, 2013   Anemia    Axillary adenopathy 04/24/2018   CAD (coronary artery disease)    Mild nonobstructive plaque in cath 2013   Chest pain    December, 2013   Cough with hemoptysis 04/22/2018   Dizziness    Dyslipidemia 07/14/2019   Encounter for antineoplastic immunotherapy 05/13/2018   Gout    Hemorrhoid    Hyperlipidemia    Hypertension    Hypothyroidism (acquired) 08/11/2018   Metastasis to lung ([place]) 05/19/2018   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) ([place]) dx'd 04/17/18   to LN, infrahilar mass, lung nodule and lt axilla   Obesity, unspecified 03/28/2009   Qualifier: Diagnosis of  By: [name], MD, [name]     Palpitations 03/28/2009   Qualifier: Diagnosis of  By: [name], MD, [name]     Right lower lobe lung mass 04/22/2018   Skin cancer    Sleep apnea    SNORING 03/28/2009   Qualifier: Diagnosis of  By: [name], MD, [name]     Spinal stenosis    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   Past Surgical History:  Procedure Laterality Date   basal skin cancer N/A 2019   Nose   BELPHAROPTOSIS REPAIR Bilateral    CARDIAC CATHETERIZATION     CATARACT EXTRACTION W/ INTRAOCULAR LENS  IMPLANT, BILATERAL     COLONOSCOPY N/A 11/03/2014   Procedure: COLONOSCOPY;  Surgeon: Rogene Houston, MD;  [facility];  Service: Endoscopy;  Laterality: N/A;  1225   CORONARY ATHERECTOMY N/A 05/26/2021   Procedure: CORONARY  ATHERECTOMY;  Surgeon: Early Osmond, MD;  Location: [facility];  Service: Cardiovascular;  Laterality: N/A;   CORONARY STENT INTERVENTION N/A 05/25/2021   Procedure: CORONARY STENT INTERVENTION;  Surgeon: Nelva Bush, MD;  Location: [facility];  Service: Cardiovascular;  Laterality: N/A;   CORONARY STENT INTERVENTION N/A 05/26/2021   Procedure: CORONARY STENT INTERVENTION;  Surgeon: Early Osmond, MD;  Location: [place] [facility];  Service: Cardiovascular;  Laterality: N/A;   ESOPHAGOGASTRODUODENOSCOPY (EGD) WITH PROPOFOL N/A 05/29/2021   Procedure: ESOPHAGOGASTRODUODENOSCOPY (EGD) WITH PROPOFOL;  Surgeon: Gatha Mayer, MD;  Location: [facility];  Service: Endoscopy;  Laterality: N/A;   INTRAVASCULAR ULTRASOUND/IVUS N/A 05/26/2021   Procedure: Intravascular Ultrasound/IVUS;  Surgeon: Early Osmond, MD;  Location: [facility];  Service: Cardiovascular;  Laterality: N/A;   IR CV LINE INJECTION  08/24/2020   IR FLUORO GUIDED NEEDLE PLC ASPIRATION/INJECTION LOC  11/28/2020   IR IMAGING GUIDED PORT INSERTION  07/11/2018   KNEE CARTILAGE SURGERY Left    Left knee   LEFT HEART CATH AND CORONARY ANGIOGRAPHY N/A 05/25/2021   Procedure: LEFT HEART CATH AND CORONARY ANGIOGRAPHY;  Surgeon: Nelva Bush, MD;  Location: [facility];  Service: Cardiovascular;  Laterality: N/A;   SKIN CANCER EXCISION  12/2014, 04/25/15   TEMPORARY PACEMAKER N/A 05/26/2021   Procedure: TEMPORARY PACEMAKER;  Surgeon: Early Osmond, MD;  Location: [facility];  Service: Cardiovascular;  Laterality: N/A;   VIDEO BRONCHOSCOPY Bilateral 05/09/2018   Procedure: VIDEO BRONCHOSCOPY WITH FLUORO;  Surgeon: Juanito Doom, MD;  Location: [facility];  Service: Cardiopulmonary;  Laterality: Bilateral;   Patient Active Problem List   Diagnosis Date Noted   Acquired dilation of ascending aorta and aortic root ([place]) 05/21/2022   COPD (chronic obstructive pulmonary disease) ([place])  09/22/2021   Back pain 09/22/2021   Fever 06/29/2021   Acute blood loss anemia    Gastrointestinal hemorrhage associated with acute gastritis    Unstable angina (H[facility]) 05/25/2021   Coronary artery disease involving native coronary artery of native heart without angina pectoris 12/07/2020   SIRS (systemic inflammatory response syndrome) ([place]) 11/27/2020   Encephalitis    Thrombocytopenia ([facility])    Severe sepsis without septic shock ([place]) 11/26/2020   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   Itching 11/17/2019   Dyslipidemia 07/14/2019   Shoulder pain 03/31/2019   Port-A-Cath in place 08/11/2018   Hypothyroidism (acquired) 08/11/2018   Metastasis to lung ([place]) 05/19/2018   Encounter for antineoplastic chemotherapy 05/13/2018   Encounter for antineoplastic immunotherapy 05/13/2018   Goals of care, counseling/discussion 05/13/2018   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[place]) 05/13/2018   Axillary adenopathy 04/24/2018   Right lower lobe lung mass 04/22/2018   Cough with hemoptysis 04/22/2018   Wheezing 04/02/2018   Long-term use of high-risk medication 04/02/2018   Dizziness 06/26/2017   Fatigue 04/30/2016   Insomnia 04/30/2016   Sinusitis 01/31/2016   Hyperlipidemia 01/31/2016   Ear pain 05/09/2015   Nephrolithiasis 04/18/2015   Anemia    Abnormal nuclear stress test    OBESITY, UNSPECIFIED 03/28/2009   Essential hypertension, benign 03/28/2009   PALPITATIONS 03/28/2009   SNORING 03/28/2009    PCP: Francis Dowse, DO  REFERRING PROVIDER: Melina Schools, MD   REFERRING DIAG: M54.50 (ICD-10-CM) - Low back pain, unspecified   Rationale for Evaluation and Treatment Rehabilitation  THERAPY DIAG:  Other low back pain  Muscle weakness (generalized)  Cramp and spasm  Other abnormalities of gait and mobility  ONSET DATE: 03/06/2022 received referral, but pt reports that he has had this pain for extensive  period  SUBJECTIVE:  SUBJECTIVE STATEMENT: Pt reports his Rt knee feels "the best it's felt in a long time",  "I've been babying it".    He has not attempted pickle ball yet. "Things have gotten so much better."  PERTINENT HISTORY:  Metastatic lung cancer, CAD, gout, spinal stenosis  PAIN:  Are you having pain? Yes: back 2/10, neck 2/10 Description:  dull     PRECAUTIONS: None  OCCUPATION: retired Chief Financial Officer  PLOF: Independent and Leisure: going to [employer], yoga classes, outdoor activities  PATIENT GOALS:  To decrease pain to be able to travel again   OBJECTIVE:   DIAGNOSTIC FINDINGS:  Lumbar MRI on 02/15/2022:  Findings: Disc spaces: Degenerative disease with disc height loss at T10-11, T11-12 and L5-S1. Disc desiccation throughout the remainder of the lumbar spine. T12-L1: No significant disc bulge. No neural foraminal stenosis. No central canal stenosis.  L1-L2: No significant disc bulge. No neural foraminal stenosis. No central canal stenosis. Mild bilateral facet arthropathy.  L2-L3: Mild broad-based disc bulge. Mild bilateral facet arthropathy. No foraminal stenosis. Mild spinal stenosis.  L3-L4: Mild broad-based disc bulge. Mild bilateral facet arthropathy. No foraminal or central canal stenosis.  L4-L5: A mild broad-based disc bulge. Moderate bilateral facet arthropathy with ligamentum flavum infolding. Minimal spinal stenosis. No significant foraminal stenosis.  L5-S1: Mild broad-based disc bulge. Mild bilateral facet arthropathy. No foraminal or central canal stenosis.   IMPRESSION: 1. Mild lumbar spine spondylosis as described above.  PATIENT SURVEYS:    03/20/2022:   FOTO 53% (projected 58% by visit 11) Modified Oswestry Low Back Pain Disability Questionnaire: 15 / 50 = 30.0  %  04/30/22: FOTO  60%  05/23/2022: FOTO 60%   SCREENING FOR RED FLAGS: Bowel or bladder incontinence: No Spinal tumors: No Cauda equina syndrome: No Compression fracture: No Abdominal aneurysm: No  COGNITION:  Overall cognitive status: Within functional limits for tasks assessed     SENSATION: WFL  POSTURE: rounded shoulders and forward head  PALPATION: No tenderness to palpation noted  LUMBAR ROM:    Limited at least 25% throughout with some increased pain   LOWER EXTREMITY MMT:    MMT Right eval Left eval Left 04/30/22 Right 04/30/22  Hip flexion      Hip extension 4 4 4+ 4+  Hip abduction '4 4 5 5  ' Hip adduction      Hip internal rotation      Hip external rotation      Knee flexion      Knee extension      Ankle dorsiflexion      Ankle plantarflexion      Ankle inversion      Ankle eversion       (Blank rows = not tested)  FUNCTIONAL TESTS:  03/20/2022: 5 times sit to stand: 13.4 sec without UE use Timed up and go (TUG): 8.4 sec without assistive device  05/23/2022: 5 times sit to stand: 11.2 sec without UE use  GAIT: Distance walked: 1000 ft Assistive device utilized: None Level of assistance: Complete Independence Comments: antalgic gait pattern noted    TODAY'S TREATMENT: 9/21: Pt seen for aquatic therapy today.  Treatment took place in water 3.25-43f 8" [facility] Temp of water was 91.  Pt entered/exited the pool via stairs independently with single rail.   * forward/ backward gait * side stepping; forward walking kicks; side step into squat R/L  * Warrior 1, lifting noodle off surface towards ceiling x 6 each leg forward (  challenging for LE) * high knee marching forward/ backward *  Ab set, chin tuck with thin square noodle submerged * Open book in wide stance, yellow hand buoys on surface, alternating sides, cues for breath * cycling in deeper water with yellow noodle under arms, behind back * Alt  between modified downward dog to plank to modified cobra/back extension with hands on bench in water x 5 * back against wall:  hamstring stretch, ITB, adductor stretch with foot in thin blue square noodle   Pt requires the buoyancy and hydrostatic pressure of water for support, and to offload joints by unweighting joint load by at least 50 % in navel deep water and by at least 75-80% in chest to neck deep water.  Viscosity of the water is needed for resistance of strengthening. Water current perturbations provides challenge to standing    05/23/2022: Nustep level 4 x7 minutes with PT present to discuss status FOTO 60% Sit to/from stand x5 reps Standing hamstring stretch at step 2x20 sec bilat Standing hip flexor stretch at step 2x20 sec bilat Alt between modified downward dog/counter stretch to modified cobra/back extension at barre 2x20 sec each Seated hamstring curl with green tband 2x10 bilat Seated LAQ with ball sqeeze and 3# 2x10 bilat   04/30/22: FOTO: see chart MMT: see chart Supine head press/chin tuck 5 sec hold 5x with core contraction; added shoulder press 5 sec 5x with core focus, added arm press to neck/shoulder/& core 5 5x.  Yellow loop clamshells 15x holding Upper 1/4 stable Tiny steps with yellow loop above knee 15x and PPT Supine on blue roll: green band scap unattached 10x each, VC for core & breath   04/23/22:  Supine head press/chin tuck, shoulder press, lower abdominals contract: hold 5 sec 10x. Added to HEP today.  Supine yellow loop clamshells 2x10 VC to add cervical retraction/shoulder press to exercise.  Supine tiny steps with yellow loop above knee Bil 6x Supine bridge with yellow loop clamshell 10x Supine on blue foam roll: red band diagonals 10x Bil      PATIENT EDUCATION:  Education details: Issued HEP Person educated: Patient Education method: Explanation Education comprehension: verbalized understanding and returned demonstration   HOME EXERCISE  PROGRAM: Access Code: [email or web address] URL: [email or web address] Date: 04/16/2022 Prepared by: Shelby Dubin Menke  Exercises - Supine Lower Trunk Rotation  - 1 x daily - 7 x weekly - 1 sets - 5 reps - 10 sec hold - Supine Piriformis Stretch with Foot on Ground  - 1 x daily - 7 x weekly - 1 sets - 2 reps - 20 sec hold - Standing Hamstring Stretch with Step  - 1 x daily - 7 x weekly - 1 sets - 2 reps - 20 sec hold - Hip Flexor Stretch on Step  - 1 x daily - 7 x weekly - 1 sets - 2 reps - 20 sec hold - Cat Cow  - 1 x daily - 7 x weekly - 2 sets - 10 reps - Sidelying Open Book Thoracic Lumbar Rotation and Extension  - 1 x daily - 7 x weekly - 1 sets - 10 reps - Supine March  - 2 x daily - 7 x weekly - 3 sets - 5 reps - Supine Bridge  - 1 x daily - 2 x weekly - 2 sets - 5 reps - 5 hold   ASSESSMENT:  CLINICAL IMPRESSION: Avoided any braiding with LE since this was aggravating factor to his R knee. Pt tolerated all exercises  without increase in pain. He required minor cues for posture and head position during session.  He had difficulty attaining/maintaining lunge position for Warrior 1 exercise. Pt is progressing well towards remaining goals.      OBJECTIVE IMPAIRMENTS difficulty walking, decreased strength, increased muscle spasms, impaired flexibility, postural dysfunction, and pain.   ACTIVITY LIMITATIONS lifting  PARTICIPATION LIMITATIONS: community activity  PERSONAL FACTORS Age, Time since onset of injury/illness/exacerbation, and 3+ comorbidities: metastatic lung cancer, CAD, spinal stenosis  are also affecting patient's functional outcome.   REHAB POTENTIAL: Good  CLINICAL DECISION MAKING: Evolving/moderate complexity  EVALUATION COMPLEXITY: Moderate   GOALS: Goals reviewed with patient? Yes  SHORT TERM GOALS: Target date: 04/10/2022  Pt will be independent with initial HEP. Baseline: Goal status: Goal met 04/09/22  2.  Pt will report pain no greater than 4/10 during  daily routine and exercise. Baseline:  Goal status: Goal met 04/30/22 3/10 in general   LONG TERM GOALS: Target date: 07/13/2022  Pt will be independent with advanced HEP. Baseline:  Goal status: IN PROGRESS  2.  Pt will increase lumbar FOTO to at least 58% to demonstrate improvements in functional mobility. Baseline: 53% Goal status: Goal met 04/30/22 60%  3.  Pt will increase bilateral hip strength to at least 5-/5 to allow him to more easily perform functional tasks. Baseline:  Goal status: IN PROGRESS  4.  Pt will report pain no greater than 2/10 during typical daily routine and activities. Baseline: on 9/13 reports right knee pain of 5/10 Goal status: IN PROGRESS  5.  Pt will report being able to return to playing pickle ball with pain no greater than 4/10 following.  Baseline:  R knee pain 5/10  Goal status:  INITIAL   PLAN: PT FREQUENCY: 1-2x/week  PT DURATION: 8 weeks  PLANNED INTERVENTIONS: Therapeutic exercises, Therapeutic activity, Neuromuscular re-education, Balance training, Gait training, Patient/Family education, Joint manipulation, Joint mobilization, Stair training, Aquatic Therapy, Dry Needling, Electrical stimulation, Spinal manipulation, Spinal mobilization, Cryotherapy, Moist heat, Taping, Traction, Ultrasound, Ionotophoresis 61m/ml Dexamethasone, Manual therapy, and Re-evaluation.  PLAN FOR NEXT SESSION: assess and progress HEP as indicated, aquatics as needed, strengthening, manual therapy/dry needling as indicated  JKerin Perna PTA 05/31/22 10:23 AM [place] [address]Phone: [phone]  Fax:  [phone]

## 2022-06-01 ENCOUNTER — Ambulatory Visit (HOSPITAL_BASED_OUTPATIENT_CLINIC_OR_DEPARTMENT_OTHER): Payer: Medicare Other | Admitting: Physical Therapy

## 2022-06-04 ENCOUNTER — Encounter: Payer: Self-pay | Admitting: Physical Therapy

## 2022-06-04 ENCOUNTER — Ambulatory Visit: Payer: Medicare Other | Admitting: Physical Therapy

## 2022-06-04 DIAGNOSIS — R2689 Other abnormalities of gait and mobility: Secondary | ICD-10-CM

## 2022-06-04 DIAGNOSIS — M5459 Other low back pain: Secondary | ICD-10-CM | POA: Diagnosis not present

## 2022-06-04 DIAGNOSIS — M6281 Muscle weakness (generalized): Secondary | ICD-10-CM

## 2022-06-04 DIAGNOSIS — M25561 Pain in right knee: Secondary | ICD-10-CM

## 2022-06-04 DIAGNOSIS — M25671 Stiffness of right ankle, not elsewhere classified: Secondary | ICD-10-CM

## 2022-06-04 DIAGNOSIS — R262 Difficulty in walking, not elsewhere classified: Secondary | ICD-10-CM

## 2022-06-04 DIAGNOSIS — R252 Cramp and spasm: Secondary | ICD-10-CM | POA: Diagnosis not present

## 2022-06-04 DIAGNOSIS — M25571 Pain in right ankle and joints of right foot: Secondary | ICD-10-CM

## 2022-06-04 NOTE — Therapy (Addendum)
OUTPATIENT PHYSICAL THERAPY TREATMENT NOTE AND DISCHARGE SUMMARY   Patient Name: Travis Padilla MRN: [id number] DOB:1946/02/16, 76 y.o., male Today's Date: 06/04/2022     PT End of Session - 06/04/22 1009     Visit Number 12    Date for PT Re-Evaluation 07/13/22    Authorization Type Med A    Progress Note Due on Visit 20    PT Start Time 1010    PT Stop Time 1035    PT Time Calculation (min) 25 min    Activity Tolerance Patient tolerated treatment well    Behavior During Therapy [facility] for tasks assessed/performed               Past Medical History:  Diagnosis Date   Abnormal nuclear stress test    December, 2013   Anemia    Axillary adenopathy 04/24/2018   CAD (coronary artery disease)    Mild nonobstructive plaque in cath 2013   Chest pain    December, 2013   Cough with hemoptysis 04/22/2018   Dizziness    Dyslipidemia 07/14/2019   Encounter for antineoplastic immunotherapy 05/13/2018   Gout    Hemorrhoid    Hyperlipidemia    Hypertension    Hypothyroidism (acquired) 08/11/2018   Metastasis to lung ([place]) 05/19/2018   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) ([place]) dx'd 04/17/18   to LN, infrahilar mass, lung nodule and lt axilla   Obesity, unspecified 03/28/2009   Qualifier: Diagnosis of  By: [name], MD, [name]     Palpitations 03/28/2009   Qualifier: Diagnosis of  By: [name], MD, [name]     Right lower lobe lung mass 04/22/2018   Skin cancer    Sleep apnea    SNORING 03/28/2009   Qualifier: Diagnosis of  By: [name], MD, [name]     Spinal stenosis    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   Past Surgical History:  Procedure Laterality Date   basal skin cancer N/A 2019   Nose   BELPHAROPTOSIS REPAIR Bilateral    CARDIAC CATHETERIZATION     CATARACT EXTRACTION W/ INTRAOCULAR LENS  IMPLANT, BILATERAL     COLONOSCOPY N/A 11/03/2014   Procedure: COLONOSCOPY;  Surgeon: Rogene Houston, MD;   [facility];  Service: Endoscopy;  Laterality: N/A;  1225   CORONARY ATHERECTOMY N/A 05/26/2021   Procedure: CORONARY ATHERECTOMY;  Surgeon: Early Osmond, MD;  Location: [place] CV LAB;  Service: Cardiovascular;  Laterality: N/A;   CORONARY STENT INTERVENTION N/A 05/25/2021   Procedure: CORONARY STENT INTERVENTION;  Surgeon: Nelva Bush, MD;  Location: [place] CV LAB;  Service: Cardiovascular;  Laterality: N/A;   CORONARY STENT INTERVENTION N/A 05/26/2021   Procedure: CORONARY STENT INTERVENTION;  Surgeon: Early Osmond, MD;  Location: [place] CV LAB;  Service: Cardiovascular;  Laterality: N/A;   ESOPHAGOGASTRODUODENOSCOPY (EGD) WITH PROPOFOL N/A 05/29/2021   Procedure: ESOPHAGOGASTRODUODENOSCOPY (EGD) WITH PROPOFOL;  Surgeon: Gatha Mayer, MD;  Location: [place];  Service: Endoscopy;  Laterality: N/A;   INTRAVASCULAR ULTRASOUND/IVUS N/A 05/26/2021   Procedure: Intravascular Ultrasound/IVUS;  Surgeon: Early Osmond, MD;  Location: [place] CV LAB;  Service: Cardiovascular;  Laterality: N/A;   IR CV LINE INJECTION  08/24/2020   IR FLUORO GUIDED NEEDLE PLC ASPIRATION/INJECTION LOC  11/28/2020   IR IMAGING GUIDED PORT INSERTION  07/11/2018   KNEE CARTILAGE SURGERY Left    Left knee   LEFT HEART CATH AND  CORONARY ANGIOGRAPHY N/A 05/25/2021   Procedure: LEFT HEART CATH AND CORONARY ANGIOGRAPHY;  Surgeon: Nelva Bush, MD;  Location: [place] CV LAB;  Service: Cardiovascular;  Laterality: N/A;   SKIN CANCER EXCISION  12/2014, 04/25/15   TEMPORARY PACEMAKER N/A 05/26/2021   Procedure: TEMPORARY PACEMAKER;  Surgeon: Early Osmond, MD;  Location: [place] [facility];  Service: Cardiovascular;  Laterality: N/A;   VIDEO BRONCHOSCOPY Bilateral 05/09/2018   Procedure: VIDEO BRONCHOSCOPY WITH FLUORO;  Surgeon: Juanito Doom, MD;  Location: [facility];  Service: Cardiopulmonary;  Laterality: Bilateral;   Patient Active Problem List   Diagnosis Date  Noted   Acquired dilation of ascending aorta and aortic root ([place]) 05/21/2022   COPD (chronic obstructive pulmonary disease) ([place]) 09/22/2021   Back pain 09/22/2021   Fever 06/29/2021   Acute blood loss anemia    Gastrointestinal hemorrhage associated with acute gastritis    Unstable angina (HCC) 05/25/2021   Coronary artery disease involving native coronary artery of native heart without angina pectoris 12/07/2020   SIRS (systemic inflammatory response syndrome) ([place]) 11/27/2020   Encephalitis    Thrombocytopenia (HCC)    Severe sepsis without septic shock ([place]) 11/26/2020   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   Itching 11/17/2019   Dyslipidemia 07/14/2019   Shoulder pain 03/31/2019   Port-A-Cath in place 08/11/2018   Hypothyroidism (acquired) 08/11/2018   Metastasis to lung ([place]) 05/19/2018   Encounter for antineoplastic chemotherapy 05/13/2018   Encounter for antineoplastic immunotherapy 05/13/2018   Goals of care, counseling/discussion 05/13/2018   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[place]) 05/13/2018   Axillary adenopathy 04/24/2018   Right lower lobe lung mass 04/22/2018   Cough with hemoptysis 04/22/2018   Wheezing 04/02/2018   Long-term use of high-risk medication 04/02/2018   Dizziness 06/26/2017   Fatigue 04/30/2016   Insomnia 04/30/2016   Sinusitis 01/31/2016   Hyperlipidemia 01/31/2016   Ear pain 05/09/2015   Nephrolithiasis 04/18/2015   Anemia    Abnormal nuclear stress test    OBESITY, UNSPECIFIED 03/28/2009   Essential hypertension, benign 03/28/2009   PALPITATIONS 03/28/2009   SNORING 03/28/2009    PCP: Francis Dowse, DO  REFERRING PROVIDER: Melina Schools, MD   REFERRING DIAG: M54.50 (ICD-10-CM) - Low back pain, unspecified   Rationale for Evaluation and Treatment Rehabilitation  THERAPY DIAG:  Other low back pain  Muscle weakness (generalized)  Cramp and spasm  Other  abnormalities of gait and mobility  Right knee pain, unspecified chronicity  Pain in right ankle and joints of right foot  Stiffness of right ankle, not elsewhere classified  Difficulty in walking, not elsewhere classified  ONSET DATE: 03/06/2022 received referral, but pt reports that he has had this pain for extensive period  SUBJECTIVE:  SUBJECTIVE STATEMENT: Everything is feeling so much better. I would like to discharge from PT today.   PERTINENT HISTORY:  Metastatic lung cancer, CAD, gout, spinal stenosis  PAIN:  Are you having pain? Yes: back 2/10, neck 2/10 Description:  dull     PRECAUTIONS: None  OCCUPATION: retired Chief Financial Officer  PLOF: Independent and Leisure: going to [employer], yoga classes, outdoor activities  PATIENT GOALS:  To decrease pain to be able to travel again   OBJECTIVE:   DIAGNOSTIC FINDINGS:  Lumbar MRI on 02/15/2022:  Findings: Disc spaces: Degenerative disease with disc height loss at T10-11, T11-12 and L5-S1. Disc desiccation throughout the remainder of the lumbar spine. T12-L1: No significant disc bulge. No neural foraminal stenosis. No central canal stenosis.  L1-L2: No significant disc bulge. No neural foraminal stenosis. No central canal stenosis. Mild bilateral facet arthropathy.  L2-L3: Mild broad-based disc bulge. Mild bilateral facet arthropathy. No foraminal stenosis. Mild spinal stenosis.  L3-L4: Mild broad-based disc bulge. Mild bilateral facet arthropathy. No foraminal or central canal stenosis.  L4-L5: A mild broad-based disc bulge. Moderate bilateral facet arthropathy with ligamentum flavum infolding. Minimal spinal stenosis. No significant foraminal stenosis.  L5-S1: Mild broad-based disc bulge. Mild bilateral facet arthropathy. No foraminal or central  canal stenosis.   IMPRESSION: 1. Mild lumbar spine spondylosis as described above.  PATIENT SURVEYS:    03/20/2022:   FOTO 53% (projected 58% by visit 11) Modified Oswestry Low Back Pain Disability Questionnaire: 15 / 50 = 30.0 %  04/30/22: FOTO  60%  05/23/2022: FOTO 60%   SCREENING FOR RED FLAGS: Bowel or bladder incontinence: No Spinal tumors: No Cauda equina syndrome: No Compression fracture: No Abdominal aneurysm: No  COGNITION:  Overall cognitive status: Within functional limits for tasks assessed     SENSATION: WFL  POSTURE: rounded shoulders and forward head  PALPATION: No tenderness to palpation noted  LUMBAR ROM:    Limited at least 25% throughout with some increased pain   LOWER EXTREMITY MMT:    MMT Right eval Left eval Left 04/30/22 Right 04/30/22 Left 06/04/22 Right 06/04/22  Hip flexion        Hip extension 4 4 4+ 4+ 4+ 4+/5  Hip abduction _0 Hip adduction        Hip internal rotation        Hip external rotation        Knee flexion        Knee extension        Ankle dorsiflexion        Ankle plantarflexion        Ankle inversion        Ankle eversion         (Blank rows = not tested)  FUNCTIONAL TESTS:  03/20/2022: 5 times sit to stand: 13.4 sec without UE use Timed up and go (TUG): 8.4 sec without assistive device  05/23/2022: 5 times sit to stand: 11.2 sec without UE use  GAIT: Distance walked: 1000 ft Assistive device utilized: None Level of assistance: Complete Independence Comments: antalgic gait pattern noted    TODAY'S TREATMENT:   06/04/22: MMT see chart above Review goals: Review of hip flexor & hamstring stretch on stairs Opportunity for questions from Pt.    9/21: Pt seen for aquatic therapy today.  Treatment took place in water 3.25-50f 8" [facility] Temp of water was 91.  Pt entered/exited the pool via stairs independently with single  rail.   * forward/ backward  gait * side stepping; forward walking kicks; side step into squat R/L  * Warrior 1, lifting noodle off surface towards ceiling x 6 each leg forward ( challenging for LE) * high knee marching forward/ backward *  Ab set, chin tuck with thin square noodle submerged * Open book in wide stance, yellow hand buoys on surface, alternating sides, cues for breath * cycling in deeper water with yellow noodle under arms, behind back * Alt between modified downward dog to plank to modified cobra/back extension with hands on bench in water x 5 * back against wall:  hamstring stretch, ITB, adductor stretch with foot in thin blue square noodle   Pt requires the buoyancy and hydrostatic pressure of water for support, and to offload joints by unweighting joint load by at least 50 % in navel deep water and by at least 75-80% in chest to neck deep water.  Viscosity of the water is needed for resistance of strengthening. Water current perturbations provides challenge to standing    05/23/2022: Nustep level 4 x7 minutes with PT present to discuss status FOTO 60% Sit to/from stand x5 reps Standing hamstring stretch at step 2x20 sec bilat Standing hip flexor stretch at step 2x20 sec bilat Alt between modified downward dog/counter stretch to modified cobra/back extension at barre 2x20 sec each Seated hamstring curl with green tband 2x10 bilat Seated LAQ with ball sqeeze and 3# 2x10 bilat      PATIENT EDUCATION:  Education details: Issued HEP Person educated: Patient Education method: Explanation Education comprehension: verbalized understanding and returned demonstration   HOME EXERCISE PROGRAM: Access Code: [email or web address] URL: [email or web address] Date: 04/16/2022 Prepared by: Shelby Dubin Menke  Exercises - Supine Lower Trunk Rotation  - 1 x daily - 7 x weekly - 1 sets - 5 reps - 10 sec hold - Supine Piriformis Stretch with Foot on Ground  - 1 x daily - 7 x weekly - 1 sets - 2 reps - 20 sec  hold - Standing Hamstring Stretch with Step  - 1 x daily - 7 x weekly - 1 sets - 2 reps - 20 sec hold - Hip Flexor Stretch on Step  - 1 x daily - 7 x weekly - 1 sets - 2 reps - 20 sec hold - Cat Cow  - 1 x daily - 7 x weekly - 2 sets - 10 reps - Sidelying Open Book Thoracic Lumbar Rotation and Extension  - 1 x daily - 7 x weekly - 1 sets - 10 reps - Supine March  - 2 x daily - 7 x weekly - 3 sets - 5 reps - Supine Bridge  - 1 x daily - 2 x weekly - 2 sets - 5 reps - 5 hold   ASSESSMENT:  CLINICAL IMPRESSION: Pt arrives pain free and reports he is consistently feeling very good, pain typically 0-2/10 on a consistent basis. Pt is compliant with HEP and taking his yoga class. Pt requested today be his last PT visit. All goal met except Lt hamstring MMT ( see above chart.)   OBJECTIVE IMPAIRMENTS difficulty walking, decreased strength, increased muscle spasms, impaired flexibility, postural dysfunction, and pain.   ACTIVITY LIMITATIONS lifting  PARTICIPATION LIMITATIONS: community activity  PERSONAL FACTORS Age, Time since onset of injury/illness/exacerbation, and 3+ comorbidities: metastatic lung cancer, CAD, spinal stenosis  are also affecting patient's functional outcome.   REHAB POTENTIAL: Good  CLINICAL DECISION MAKING: Evolving/moderate complexity  EVALUATION COMPLEXITY: Moderate   GOALS:  Goals reviewed with patient? Yes  SHORT TERM GOALS: Target date: 04/10/2022  Pt will be independent with initial HEP. Baseline: Goal status: Goal met 04/09/22  2.  Pt will report pain no greater than 4/10 during daily routine and exercise. Baseline:  Goal status: Goal met 04/30/22 3/10 in general   LONG TERM GOALS: Target date: 07/13/2022  Pt will be independent with advanced HEP. Baseline:  Goal status: Goal met 06/04/22  2.  Pt will increase lumbar FOTO to at least 58% to demonstrate improvements in functional mobility. Baseline: 53% Goal status: Goal met 04/30/22 60%  3.  Pt will  increase bilateral hip strength to at least 5-/5 to allow him to more easily perform functional tasks. Baseline:  Goal status: Goal partially met  06/04/22  4.  Pt will report pain no greater than 2/10 during typical daily routine and activities. Baseline: on 9/13 reports right knee pain of 5/10 Goal status: Goal met 06/04/22  5.  Pt will report being able to return to playing pickle ball with pain no greater than 4/10 following.  Baseline:  R knee pain 5/10  Goal status:  Have not tried yet, might try or might not   PLAN: PT FREQUENCY: 1-2x/week  PT DURATION: 8 weeks  PLANNED INTERVENTIONS: Therapeutic exercises, Therapeutic activity, Neuromuscular re-education, Balance training, Gait training, Patient/Family education, Joint manipulation, Joint mobilization, Stair training, Aquatic Therapy, Dry Needling, Electrical stimulation, Spinal manipulation, Spinal mobilization, Cryotherapy, Moist heat, Taping, Traction, Ultrasound, Ionotophoresis 24m/ml Dexamethasone, Manual therapy, and Re-evaluation.  PLAN FOR NEXT SESSION: DC pt is satisfied with current functional level.  JMyrene Galas PTA 06/04/22 10:36 AM     PHYSICAL THERAPY DISCHARGE SUMMARY   Patient agrees to discharge. Patient goals were  All met except pickle ball goal . Patient is being discharged due to being pleased with the current functional level.   SJuel Burrow PT, DPT 06/04/22 11:56 AM  [facility] [address] [place] Rock[place]uff[place] Center [place] [address]Phone # [phone] [phone]

## 2022-06-06 DIAGNOSIS — L570 Actinic keratosis: Secondary | ICD-10-CM | POA: Diagnosis not present

## 2022-06-06 DIAGNOSIS — L578 Other skin changes due to chronic exposure to nonionizing radiation: Secondary | ICD-10-CM | POA: Diagnosis not present

## 2022-06-07 ENCOUNTER — Ambulatory Visit (HOSPITAL_BASED_OUTPATIENT_CLINIC_OR_DEPARTMENT_OTHER): Payer: Medicare Other | Admitting: Physical Therapy

## 2022-06-11 ENCOUNTER — Ambulatory Visit: Payer: Medicare Other | Admitting: Physical Therapy

## 2022-06-14 ENCOUNTER — Ambulatory Visit (HOSPITAL_BASED_OUTPATIENT_CLINIC_OR_DEPARTMENT_OTHER): Payer: Medicare Other | Admitting: Physical Therapy

## 2022-06-18 ENCOUNTER — Ambulatory Visit: Payer: Medicare Other | Admitting: Rehabilitative and Restorative Service Providers"

## 2022-06-20 DIAGNOSIS — M25512 Pain in left shoulder: Secondary | ICD-10-CM | POA: Diagnosis not present

## 2022-06-20 DIAGNOSIS — S29011A Strain of muscle and tendon of front wall of thorax, initial encounter: Secondary | ICD-10-CM | POA: Diagnosis not present

## 2022-06-21 ENCOUNTER — Ambulatory Visit (HOSPITAL_BASED_OUTPATIENT_CLINIC_OR_DEPARTMENT_OTHER): Payer: Medicare Other | Admitting: Physical Therapy

## 2022-06-26 ENCOUNTER — Ambulatory Visit (HOSPITAL_BASED_OUTPATIENT_CLINIC_OR_DEPARTMENT_OTHER): Payer: Medicare Other | Admitting: Physical Therapy

## 2022-06-28 ENCOUNTER — Encounter: Payer: Medicare Other | Admitting: Rehabilitative and Restorative Service Providers"

## 2022-07-03 ENCOUNTER — Ambulatory Visit (HOSPITAL_BASED_OUTPATIENT_CLINIC_OR_DEPARTMENT_OTHER): Payer: Medicare Other | Admitting: Physical Therapy

## 2022-07-05 ENCOUNTER — Encounter: Payer: Medicare Other | Admitting: Rehabilitative and Restorative Service Providers"

## 2022-07-10 ENCOUNTER — Ambulatory Visit (HOSPITAL_BASED_OUTPATIENT_CLINIC_OR_DEPARTMENT_OTHER): Payer: Medicare Other | Admitting: Physical Therapy

## 2022-07-12 ENCOUNTER — Ambulatory Visit: Payer: Medicare Other | Admitting: Rehabilitative and Restorative Service Providers"

## 2022-07-22 ENCOUNTER — Other Ambulatory Visit: Payer: Self-pay | Admitting: Family Medicine

## 2022-07-22 DIAGNOSIS — E039 Hypothyroidism, unspecified: Secondary | ICD-10-CM

## 2022-08-17 ENCOUNTER — Other Ambulatory Visit: Payer: Self-pay | Admitting: Cardiology

## 2022-08-18 ENCOUNTER — Other Ambulatory Visit: Payer: Self-pay | Admitting: Family Medicine

## 2022-08-18 DIAGNOSIS — E039 Hypothyroidism, unspecified: Secondary | ICD-10-CM

## 2022-08-20 ENCOUNTER — Other Ambulatory Visit: Payer: Medicare Other

## 2022-08-20 ENCOUNTER — Other Ambulatory Visit: Payer: Self-pay

## 2022-08-20 ENCOUNTER — Telehealth: Payer: Self-pay | Admitting: Family Medicine

## 2022-08-20 ENCOUNTER — Other Ambulatory Visit: Payer: Self-pay | Admitting: Family Medicine

## 2022-08-20 ENCOUNTER — Inpatient Hospital Stay: Payer: Medicare Other | Attending: Internal Medicine

## 2022-08-20 DIAGNOSIS — I2510 Atherosclerotic heart disease of native coronary artery without angina pectoris: Secondary | ICD-10-CM | POA: Diagnosis not present

## 2022-08-20 DIAGNOSIS — Z79890 Hormone replacement therapy (postmenopausal): Secondary | ICD-10-CM | POA: Insufficient documentation

## 2022-08-20 DIAGNOSIS — I10 Essential (primary) hypertension: Secondary | ICD-10-CM | POA: Diagnosis not present

## 2022-08-20 DIAGNOSIS — C3412 Malignant neoplasm of upper lobe, left bronchus or lung: Secondary | ICD-10-CM | POA: Diagnosis not present

## 2022-08-20 DIAGNOSIS — E039 Hypothyroidism, unspecified: Secondary | ICD-10-CM | POA: Diagnosis not present

## 2022-08-20 DIAGNOSIS — C3491 Malignant neoplasm of unspecified part of right bronchus or lung: Secondary | ICD-10-CM

## 2022-08-20 LAB — CMP (CANCER CENTER ONLY)
ALT: 11 U/L (ref 0–44)
AST: 16 U/L (ref 15–41)
Albumin: 4.3 g/dL (ref 3.5–5.0)
Alkaline Phosphatase: 58 U/L (ref 38–126)
Anion gap: 6 (ref 5–15)
BUN: 16 mg/dL (ref 8–23)
CO2: 29 mmol/L (ref 22–32)
Calcium: 9.9 mg/dL (ref 8.9–10.3)
Chloride: 108 mmol/L (ref 98–111)
Creatinine: 1.09 mg/dL (ref 0.61–1.24)
GFR, Estimated: >60 mL/min (ref >60)
Glucose, Bld: 111 mg/dL — ABNORMAL HIGH (ref 70–99)
Potassium: 5.3 mmol/L — ABNORMAL HIGH (ref 3.5–5.1)
Sodium: 143 mmol/L (ref 135–145)
Total Bilirubin: 0.7 mg/dL (ref 0.3–1.2)
Total Protein: 7.3 g/dL (ref 6.5–8.1)

## 2022-08-20 LAB — CBC WITH DIFFERENTIAL (CANCER CENTER ONLY)
Abs Immature Granulocytes: 0.03 10*3/uL (ref 0.00–0.07)
Basophils Absolute: 0 10*3/uL (ref 0.0–0.1)
Basophils Relative: 0 %
Eosinophils Absolute: 0.2 10*3/uL (ref 0.0–0.5)
Eosinophils Relative: 3 %
HCT: 39.3 % (ref 39.0–52.0)
Hemoglobin: 14 g/dL (ref 13.0–17.0)
Immature Granulocytes: 1 %
Lymphocytes Relative: 14 %
Lymphs Abs: 0.9 10*3/uL (ref 0.7–4.0)
MCH: 34.7 pg — ABNORMAL HIGH (ref 26.0–34.0)
MCHC: 35.6 g/dL (ref 30.0–36.0)
MCV: 97.3 fL (ref 80.0–100.0)
Monocytes Absolute: 0.8 10*3/uL (ref 0.1–1.0)
Monocytes Relative: 13 %
Neutro Abs: 4.3 10*3/uL (ref 1.7–7.7)
Neutrophils Relative %: 69 %
Platelet Count: 157 10*3/uL (ref 150–400)
RBC: 4.04 MIL/uL — ABNORMAL LOW (ref 4.22–5.81)
RDW: 11.9 % (ref 11.5–15.5)
WBC Count: 6.2 10*3/uL (ref 4.0–10.5)
nRBC: 0 % (ref 0.0–0.2)

## 2022-08-20 NOTE — Telephone Encounter (Signed)
Pt has appt on 09/19/2022 with DR. Darnell Level. Needs refill

## 2022-08-20 NOTE — Telephone Encounter (Signed)
  Prescription Request  08/20/2022  Is this a "Controlled Substance" medicine?   Have you seen your PCP in the last 2 weeks? Appt made for 1/26 - getting labs 12/11  If YES, route message to pool  -  If NO, patient needs to be scheduled for appointment.  What is the name of the medication or equipment?  levothyroxine (SYNTHROID) 175 MCG tablet   Have you contacted your pharmacy to request a refill? yes   Which pharmacy would you like this sent to? [facility] in [place]   Patient notified that their request is being sent to the clinical staff for review and that they should receive a response within 2 business days.

## 2022-08-20 NOTE — Telephone Encounter (Signed)
[place] Needs repeat labwork only. 30 days given 07/22/22

## 2022-08-21 ENCOUNTER — Ambulatory Visit (HOSPITAL_BASED_OUTPATIENT_CLINIC_OR_DEPARTMENT_OTHER)
Admission: RE | Admit: 2022-08-21 | Discharge: 2022-08-21 | Disposition: A | Discharge status: Home / Self Care | Payer: Medicare Other | Source: Ambulatory Visit | Attending: Internal Medicine | Admitting: Internal Medicine

## 2022-08-21 DIAGNOSIS — K5730 Diverticulosis of large intestine without perforation or abscess without bleeding: Secondary | ICD-10-CM | POA: Diagnosis not present

## 2022-08-21 DIAGNOSIS — C3490 Malignant neoplasm of unspecified part of unspecified bronchus or lung: Secondary | ICD-10-CM | POA: Insufficient documentation

## 2022-08-21 LAB — TSH: TSH: 1.52 u[IU]/mL (ref 0.450–4.500)

## 2022-08-21 MED ORDER — IOHEXOL 300 MG/ML  SOLN
100.0000 mL | Freq: Once | INTRAMUSCULAR | Status: AC | PRN
  Administered 2022-08-21: 100 mL via INTRAVENOUS

## 2022-08-22 MED ORDER — LEVOTHYROXINE SODIUM 175 MCG PO TABS: 175.0000 ug | ORAL_TABLET | Freq: Every day | ORAL | 0 refills | Status: DC

## 2022-08-22 NOTE — Telephone Encounter (Signed)
Pt aware refill sent to pharmacy 

## 2022-08-22 NOTE — Telephone Encounter (Signed)
Refill sent to pharmacy in [facility]

## 2022-08-23 ENCOUNTER — Other Ambulatory Visit: Payer: Self-pay

## 2022-08-23 ENCOUNTER — Inpatient Hospital Stay (HOSPITAL_BASED_OUTPATIENT_CLINIC_OR_DEPARTMENT_OTHER): Payer: Medicare Other | Admitting: Internal Medicine

## 2022-08-23 VITALS — BP 115/79 | HR 80 | Temp 98.1°F | Resp 19 | Ht 74.0 in | Wt 239.6 lb

## 2022-08-23 DIAGNOSIS — Z79890 Hormone replacement therapy (postmenopausal): Secondary | ICD-10-CM | POA: Diagnosis not present

## 2022-08-23 DIAGNOSIS — E039 Hypothyroidism, unspecified: Secondary | ICD-10-CM | POA: Diagnosis not present

## 2022-08-23 DIAGNOSIS — Z95828 Presence of other vascular implants and grafts: Secondary | ICD-10-CM | POA: Diagnosis not present

## 2022-08-23 DIAGNOSIS — I10 Essential (primary) hypertension: Secondary | ICD-10-CM | POA: Diagnosis not present

## 2022-08-23 DIAGNOSIS — C3412 Malignant neoplasm of upper lobe, left bronchus or lung: Secondary | ICD-10-CM | POA: Diagnosis not present

## 2022-08-23 DIAGNOSIS — C3490 Malignant neoplasm of unspecified part of unspecified bronchus or lung: Secondary | ICD-10-CM | POA: Diagnosis not present

## 2022-08-23 DIAGNOSIS — C7801 Secondary malignant neoplasm of right lung: Secondary | ICD-10-CM | POA: Diagnosis not present

## 2022-08-23 DIAGNOSIS — I2510 Atherosclerotic heart disease of native coronary artery without angina pectoris: Secondary | ICD-10-CM | POA: Diagnosis not present

## 2022-08-23 MED ORDER — HEPARIN SOD (PORK) LOCK FLUSH 100 UNIT/ML IV SOLN
500.0000 [IU] | Freq: Once | INTRAVENOUS | Status: AC
  Administered 2022-08-23: 500 [IU]

## 2022-08-23 MED ORDER — SODIUM CHLORIDE 0.9% FLUSH
10.0000 mL | Freq: Once | INTRAVENOUS | Status: AC
  Administered 2022-08-23: 10 mL

## 2022-08-23 NOTE — Progress Notes (Signed)
[facility] [phone]   [phone]  OFFICE PROGRESS NOTE  [name], DO [place] [address]  DIAGNOSIS: Stage IV (T3, N0, M1c) non-small cell lung cancer, squamous cell carcinoma presented with large right infrahilar mass in addition to left upper lobe lung nodule as well as left axillary mass with left axillary lymph node diagnosed in August 2019.  PRIOR THERAPY:  1) Palliative radiotherapy to the right infrahilar mass as well as the axillary mass under the care of Dr. Lisbeth Renshaw. 2) Systemic chemotherapy with carboplatin for AUC of 5, paclitaxel 175 mg/M2 and Keytruda 200 mg IV every 3 weeks status post 4 cycles. 3) Maintenance immunotherapy with single agent Keytruda 200 mg IV every 3 weeks status post 41 cycles.  His treatment is currently on hold secondary to intolerance.  CURRENT THERAPY: Observation.  INTERVAL HISTORY: Travis Padilla 76 y.o. male returns to the clinic today for follow-up visit.  The patient is feeling fine today with no concerning complaints except for the mild fatigue and occasional aching pain on the left side of the chest.  He denied having any shortness of breath except with exertion with no cough or hemoptysis.  He has no nausea, vomiting, diarrhea or constipation.  He has no headache or visual changes.  He denied having any recent weight loss or night sweats.  He is currently on observation and doing well.  He had repeat CT scan of the chest, abdomen and pelvis performed recently and he is here for evaluation and discussion of his scan results.   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) ([place]) dx'd 04/17/18   to LN, infrahilar mass, lung nodule and lt axilla   Obesity, unspecified 03/28/2009   Qualifier: Diagnosis of  By: [name], MD, [name]     Palpitations 03/28/2009   Qualifier: Diagnosis of  By: [name], MD, [name]     Right lower lobe lung mass 04/22/2018   Skin cancer    Sleep apnea    SNORING 03/28/2009   Qualifier: Diagnosis of  By: [name], MD, [name]     Spinal stenosis    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[place]) 05/13/2018    ALLERGIES:  is allergic to iohexol, pravastatin, and iodine.  MEDICATIONS:  Current Outpatient Medications  Medication Sig Dispense Refill   acetaminophen (TYLENOL) 500 MG tablet Take 1,000 mg by mouth every 6 (six) hours as needed.     albuterol (VENTOLIN HFA) 108 (90 Base) MCG/ACT inhaler Inhale 2 puffs into the lungs every 6 (six) hours as needed for wheezing or shortness of breath. 8 g 6   ALPRAZolam (XANAX) 0.25 MG tablet Take 1 tablet (0.25 mg total) by mouth at bedtime as needed for anxiety. 30 tablet 0   Artificial Tear Solution (SOOTHE XP OP) Place 1 drop  into both eyes 2 (two) times daily.     carvedilol (COREG) 25 MG tablet Take 1 tablet (25 mg total) by mouth 2 (two) times daily with a meal. 60 tablet 0   clopidogrel (PLAVIX) 75 MG tablet TAKE 1 TABLET BY MOUTH EVERY DAY 90 tablet 2   [employer] IRON 325 (65 Fe) MG tablet TAKE 1 TABLET BY MOUTH DAILY 90 tablet 1   diphenhydrAMINE (BENADRYL) 50 MG tablet Take 1 tablet (50 mg total) by mouth once for 1 dose. Pt to take 50 mg of benadryl on 10/20/21 at 12:00 PM (noon). Please call [phone] with any questions. 1 tablet 0   fluticasone (FLONASE) 50 MCG/ACT nasal spray Place 1 spray into both nostrils daily. (Patient  taking differently: Place 1 spray into both nostrils daily as needed for allergies.) 16 g 3   levothyroxine (SYNTHROID) 175 MCG tablet Take 1 tablet (175 mcg total) by mouth daily. 30 tablet 0   methocarbamol (ROBAXIN) 500 MG tablet Take 500 mg by mouth every 6 (six) hours as needed for muscle spasms.     nitroGLYCERIN (NITROSTAT) 0.4 MG SL tablet Place 1 tablet (0.4 mg total) under the tongue every 5 (five) minutes as needed for chest pain. 25 tablet 2   pantoprazole (PROTONIX) 40 MG tablet TAKE 1 TABLET BY MOUTH DAILY BEFORE BREAKFAST 90 tablet 3   polyethylene glycol powder (GLYCOLAX/MIRALAX) 17 GM/SCOOP powder Take 1 Container by mouth daily.     PRALUENT 150 MG/ML SOAJ INJECT 150 MG INTO THE SKIN EVERY 14 (FOURTEEN) DAYS. 6 mL 3   predniSONE (DELTASONE) 50 MG tablet Pt to take 50 mg of prednisone on 10/20/21 at 12:00 AM (midnight), 50 mg of prednisone on 10/20/21 at 6:00 AM, and 50 mg of prednisone on 10/20/21 at 12:00 PM (noon). Pt is also to take 50 mg of benadryl on 10/20/21 at 12:00 PM (noon). Please call [phone] with any questions. (Patient not taking: Reported on 01/18/2022) 3 tablet 0   senna-docusate (SENNA S) 8.6-50 MG tablet 1 to 2 twice daily for constipation 120 tablet 5   tamsulosin (FLOMAX) 0.4 MG CAPS capsule Take 0.4 mg by mouth at bedtime.     No current facility-administered medications for this visit.    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   BELPHAROPTOSIS REPAIR Bilateral    CARDIAC CATHETERIZATION     CATARACT EXTRACTION W/ INTRAOCULAR LENS  IMPLANT, BILATERAL     COLONOSCOPY N/A 11/03/2014   Procedure: COLONOSCOPY;  Surgeon: Rogene Houston, MD;  [facility];  Service: Endoscopy;  Laterality: N/A;  1225   CORONARY ATHERECTOMY N/A 05/26/2021   Procedure: CORONARY ATHERECTOMY;  Surgeon: Early Osmond, MD;  Location: [facility];  Service: Cardiovascular;  Laterality: N/A;    CORONARY STENT INTERVENTION N/A 05/25/2021   Procedure: CORONARY STENT INTERVENTION;  Surgeon: Nelva Bush, MD;  Location: [facility];  Service: Cardiovascular;  Laterality: N/A;   CORONARY STENT INTERVENTION N/A 05/26/2021   Procedure: CORONARY STENT INTERVENTION;  Surgeon: Early Osmond, MD;  Location: [facility];  Service: Cardiovascular;  Laterality: N/A;   ESOPHAGOGASTRODUODENOSCOPY (EGD) WITH PROPOFOL N/A 05/29/2021   Procedure: ESOPHAGOGASTRODUODENOSCOPY (EGD) WITH PROPOFOL;  Surgeon: Gatha Mayer, MD;  Location: [facility];  Service: Endoscopy;  Laterality: N/A;   INTRAVASCULAR ULTRASOUND/IVUS N/A 05/26/2021   Procedure: Intravascular Ultrasound/IVUS;  Surgeon: Early Osmond, MD;  Location: [facility];  Service: Cardiovascular;  Laterality: N/A;   IR CV LINE INJECTION  08/24/2020   IR FLUORO GUIDED NEEDLE PLC ASPIRATION/INJECTION LOC  11/28/2020   IR IMAGING GUIDED PORT INSERTION  07/11/2018   KNEE CARTILAGE SURGERY Left    Left knee   LEFT HEART CATH AND CORONARY ANGIOGRAPHY N/A 05/25/2021   Procedure: LEFT HEART CATH AND CORONARY ANGIOGRAPHY;  Surgeon: Nelva Bush, MD;  Location: [facility];  Service: Cardiovascular;  Laterality: N/A;   SKIN CANCER EXCISION  12/2014, 04/25/15   TEMPORARY PACEMAKER N/A 05/26/2021   Procedure: TEMPORARY PACEMAKER;  Surgeon: Early Osmond, MD;  Location: [facility];  Service: Cardiovascular;  Laterality: N/A;   VIDEO BRONCHOSCOPY Bilateral 05/09/2018   Procedure: VIDEO BRONCHOSCOPY WITH FLUORO;  Surgeon: Juanito Doom, MD;  Location: [facility];  Service: Cardiopulmonary;  Laterality: Bilateral;    REVIEW OF SYSTEMS:  Constitutional: positive for fatigue Eyes: negative Ears, nose, mouth, throat, and face: negative Respiratory: positive for dyspnea on exertion, pleurisy/chest pain, and wheezing Cardiovascular: negative Gastrointestinal: negative Genitourinary:negative Integument/breast:  negative Hematologic/lymphatic: negative Musculoskeletal:negative Neurological: negative Behavioral/Psych: negative Endocrine: negative Allergic/Immunologic: negative   PHYSICAL EXAMINATION: General appearance: alert, cooperative, fatigued, and no distress Head: Normocephalic, without obvious abnormality, atraumatic Neck: no adenopathy, no JVD, supple, symmetrical, trachea midline, and thyroid not enlarged, symmetric, no tenderness/mass/nodules Lymph nodes: Cervical, supraclavicular, and axillary nodes normal. Resp: clear to auscultation bilaterally Back: symmetric, no curvature. ROM normal. No CVA tenderness. Cardio: regular rate and rhythm, S1, S2 normal, no murmur, click, rub or gallop GI: soft, non-tender; bowel sounds normal; no masses,  no organomegaly Extremities: extremities normal, atraumatic, no cyanosis or edema Neurologic: Alert and oriented X 3, normal strength and tone. Normal symmetric reflexes. Normal coordination and gait  ECOG PERFORMANCE STATUS: 1 - Symptomatic but completely ambulatory  Blood pressure 115/79, pulse 80, temperature 98.1 F (36.7 C), temperature source Oral, resp. rate 19, height 6\' 2"  (1.88 m), weight 239 lb 9.6 oz (108.7 kg), SpO2 100 %.  LABORATORY DATA: Lab Results  Component Value Date   WBC 6.2 08/20/2022   HGB 14.0 08/20/2022   HCT 39.3 08/20/2022   MCV 97.3 08/20/2022   PLT 157 08/20/2022      Chemistry      Component Value Date/Time   NA 143 08/20/2022 1048   NA 141 04/18/2022 1116   K 5.3 (H) 08/20/2022 1048   CL 108 08/20/2022 1048   CO2 29 08/20/2022 1048   BUN 16 08/20/2022 1048   BUN 19 04/18/2022 1116   CREATININE 1.09 08/20/2022 1048      Component Value Date/Time   CALCIUM 9.9 08/20/2022 1048   ALKPHOS 58 08/20/2022 1048   AST 16 08/20/2022 1048   ALT 11 08/20/2022 1048   BILITOT 0.7 08/20/2022 1048       RADIOGRAPHIC STUDIES: CT CHEST ABDOMEN PELVIS W CONTRAST  Result Date: 08/21/2022 CLINICAL DATA:   Non-small-cell lung cancer, restaging. Prior radiation therapy. * Tracking Code: BO * EXAM: CT CHEST, ABDOMEN, AND PELVIS WITH CONTRAST TECHNIQUE: Multidetector CT imaging of the chest, abdomen and pelvis was performed following the standard protocol during bolus administration of intravenous contrast. RADIATION DOSE REDUCTION: This exam was performed according to the departmental dose-optimization program which includes automated exposure control, adjustment of the mA and/or kV according to patient size and/or use of iterative reconstruction technique. CONTRAST:  122mL OMNIPAQUE IOHEXOL 300 MG/ML  SOLN COMPARISON:  04/17/2022 FINDINGS: CT CHEST FINDINGS Cardiovascular: Right Port-A-Cath tip high  right atrium. Aortic atherosclerosis. Tortuous thoracic aorta. Normal heart size, without pericardial effusion. Three vessel coronary artery calcification. No central pulmonary embolism, on this non-dedicated study. Pulmonary artery enlargement, outflow tract 3.4 cm. Mediastinum/Nodes: No supraclavicular adenopathy. Lateral left axillary node measures 1.1 x 1.6 cm on 07/02 versus 1.3 x 1.7 cm on the prior. No mediastinal or hilar adenopathy. Lungs/Pleura: No pleural fluid. Similar appearance of right perihilar and medial right lower lobe radiation fibrosis with architectural distortion and traction bronchiectasis. Further increase in medial left upper lobe radiation induced consolidation and ground-glass. Consolidation surrounds and partially obscures the anterior left upper lobe lung lesion, which is estimated at 2.3 x 2.3 cm on 70/4 versus 2.6 x 2.3 cm on the prior exam (when remeasured). Musculoskeletal: No acute osseous abnormality. CT ABDOMEN PELVIS FINDINGS Hepatobiliary: Posterior segment 2 hypoattenuating liver lesion of 7 mm is well-circumscribed, most consistent with a complex cyst. More simple in appearance and larger on 08/18/2021. No suspicious liver lesion or biliary abnormality. Pancreas: Normal, without mass  or ductal dilatation. Spleen: Normal in size, without focal abnormality. Adrenals/Urinary Tract: Normal adrenal glands. Bilateral renal collecting system stones of up to 3-4 mm. Upper pole left renal lesion is consistent with a cyst at 9 mm . In the absence of clinically indicated signs/symptoms require(s) no independent follow-up. No hydronephrosis. Normal urinary bladder. Stomach/Bowel: Normal stomach, without wall thickening. Extensive colonic diverticulosis. Normal terminal ileum. Normal small bowel. Vascular/Lymphatic: Aortic atherosclerosis. No abdominopelvic adenopathy. Reproductive: Mild prostatomegaly. Other: Trace fluid in the pelvis is nonspecific on 112/2. No free intraperitoneal air. No evidence of omental or peritoneal disease. Musculoskeletal: Degenerative disc disease at the lumbosacral junction. IMPRESSION: 1. Evolving/progressive radiation change in the left upper lobe. This partially obscures the left upper lobe nodule, which is felt to be decreased in size. 2. Decreased size of a left axillary mildly enlarged node. 3. Similar appearance of radiation change in the right perihilar region and medial lower lobe. 4. No acute process or evidence of metastatic disease in the abdomen or pelvis. 5. Bilateral nephrolithiasis 6. Coronary artery atherosclerosis. Aortic Atherosclerosis (ICD10-I70.0). 7. Pulmonary artery enlargement suggests pulmonary arterial hypertension. 8. Normal thoracic aortic caliber. 9. Trace free pelvic fluid, nonspecific. Electronically Signed   By: Abigail Miyamoto M.D.   On: 08/21/2022 10:32     ASSESSMENT AND PLAN: This is a very pleasant 76 years old white male with very light most smoking history recently diagnosed with stage IV (T3, N0, M1c) non-small cell lung cancer, squamous cell carcinoma based on the biopsy from the left axillary mass. He status post 4 cycles of induction systemic chemotherapy with carboplatin, paclitaxel and Keytruda with partial response.  The patient is  currently on maintenance treatment with single agent Keytruda status post 41 cycles.  He also received palliative radiotherapy to the enlarging left upper lobe lung mass under the care of Dr. Lisbeth Renshaw.  The patient is currently on observation and he is doing fine with no concerning complaints except for mild fatigue and occasional dull ache in the left side of the chest from the previous radiotherapy. He had repeat CT scan of the chest, abdomen and pelvis performed recently.  I personally and independently reviewed the scan images and discussed the result and showed the images to the patient today. His scan showed no concerning findings for disease progression. I recommended for him to continue on observation with repeat CT scan of the chest in 6 months. I will arrange for the patient to have Port-A-Cath flush every 2  months. For the hypothyroidism he is currently on levothyroxine managed by his primary care physician. For the history of coronary artery disease as well as the aortic aneurysm, he is followed by Dr. [name]. The patient was advised to call immediately if he has any other concerning symptoms in the interval. The patient voices understanding of current disease status and treatment options and is in agreement with the current care plan. All questions were answered. The patient knows to call the clinic with any problems, questions or concerns. We can certainly see the patient much sooner if necessary.  Disclaimer: This note was dictated with voice recognition software. Similar sounding words can inadvertently be transcribed and may not be corrected upon review.

## 2022-08-23 NOTE — Addendum Note (Signed)
Addended by: Ardeen Garland on: 08/23/2022 11:48 AM   Modules accepted: Orders

## 2022-08-24 ENCOUNTER — Other Ambulatory Visit: Payer: Self-pay

## 2022-08-29 ENCOUNTER — Encounter (INDEPENDENT_AMBULATORY_CARE_PROVIDER_SITE_OTHER): Payer: Medicare Other | Admitting: Family Medicine

## 2022-08-29 ENCOUNTER — Telehealth: Payer: Self-pay

## 2022-08-29 DIAGNOSIS — R1030 Lower abdominal pain, unspecified: Secondary | ICD-10-CM

## 2022-08-29 NOTE — Telephone Encounter (Signed)
Please see the MyChart message reply(ies) for my assessment and plan.   Orders Placed This Encounter  Procedures   CT Abdomen Pelvis W Contrast    Standing Status:   Future    Standing Expiration Date:   08/30/2023    Order Specific Question:   If indicated for the ordered procedure, I authorize the administration of contrast media per Radiology protocol    Answer:   Yes    Order Specific Question:   Does the patient have a contrast media/X-ray dye allergy?    Answer:   No    Order Specific Question:   Preferred imaging location?    Answer:   [facility]    Order Specific Question:   Is Oral Contrast requested for this exam?    Answer:   Yes, Per Radiology protocol    This patient gave consent for this Medical Advice Message and is aware that it may result in a bill to their insurance company, as well as the possibility of receiving a bill for a co-payment or deductible. They are an established patient, but are not seeking medical advice exclusively about a problem treated during an in person or video visit in the last seven days. I did not recommend an in person or video visit within seven days of my reply.    I spent a total of 13 minutes cumulative time within 7 days through [employer].  Ronnie Doss, DO

## 2022-08-29 NOTE — Telephone Encounter (Signed)
Patient called in complaining of diarrhea and losing weight the past couple days. We do not have an appointment so I advised pt to send [place] a mychart message and I will forward to you

## 2022-08-30 ENCOUNTER — Other Ambulatory Visit: Payer: Self-pay | Admitting: Internal Medicine

## 2022-08-30 ENCOUNTER — Ambulatory Visit (HOSPITAL_COMMUNITY)
Admission: RE | Admit: 2022-08-30 | Discharge: 2022-08-30 | Disposition: A | Discharge status: Home / Self Care | Payer: Medicare Other | Source: Ambulatory Visit | Attending: Family Medicine | Admitting: Family Medicine

## 2022-08-30 ENCOUNTER — Ambulatory Visit (HOSPITAL_COMMUNITY): Payer: Medicare Other

## 2022-08-30 ENCOUNTER — Telehealth: Payer: Self-pay | Admitting: Family Medicine

## 2022-08-30 DIAGNOSIS — N200 Calculus of kidney: Secondary | ICD-10-CM | POA: Diagnosis not present

## 2022-08-30 DIAGNOSIS — R1030 Lower abdominal pain, unspecified: Secondary | ICD-10-CM | POA: Diagnosis not present

## 2022-08-30 DIAGNOSIS — Z125 Encounter for screening for malignant neoplasm of prostate: Secondary | ICD-10-CM | POA: Diagnosis not present

## 2022-08-30 DIAGNOSIS — R1032 Left lower quadrant pain: Secondary | ICD-10-CM | POA: Diagnosis not present

## 2022-08-30 MED ORDER — IOHEXOL 9 MG/ML PO SOLN
1000.0000 mL | Freq: Once | ORAL | Status: AC
  Administered 2022-08-30: 1000 mL via ORAL

## 2022-08-30 MED ORDER — HEPARIN SOD (PORK) LOCK FLUSH 100 UNIT/ML IV SOLN
INTRAVENOUS | Status: AC
  Administered 2022-08-30: 500 [IU]
  Filled 2022-08-30: qty 5

## 2022-08-30 MED ORDER — IOHEXOL 300 MG/ML  SOLN
100.0000 mL | Freq: Once | INTRAMUSCULAR | Status: AC | PRN
  Administered 2022-08-30: 100 mL via INTRAVENOUS

## 2022-08-30 MED ORDER — HEPARIN SOD (PORK) LOCK FLUSH 100 UNIT/ML IV SOLN: 500.0000 [IU] | Freq: Once | INTRAVENOUS | Status: DC

## 2022-08-30 NOTE — Telephone Encounter (Signed)
Patient has an appointment at [place] on 12/21 at [facility] and he has a contrast allergy. They were unsure if he was given the 13 hour contrast because they did not see any documentation for it. Wanting to know if they should call to reschedule the appointment. Please call back to let them know.

## 2022-08-30 NOTE — Telephone Encounter (Signed)
Vanda aware

## 2022-08-31 ENCOUNTER — Other Ambulatory Visit: Payer: Self-pay | Admitting: Family Medicine

## 2022-08-31 DIAGNOSIS — K5792 Diverticulitis of intestine, part unspecified, without perforation or abscess without bleeding: Secondary | ICD-10-CM

## 2022-08-31 MED ORDER — AMOXICILLIN-POT CLAVULANATE 875-125 MG PO TABS: 1.0000 | ORAL_TABLET | Freq: Two times a day (BID) | ORAL | 0 refills | Status: DC

## 2022-09-04 ENCOUNTER — Other Ambulatory Visit: Payer: Self-pay | Admitting: Pharmacist

## 2022-09-06 DIAGNOSIS — N200 Calculus of kidney: Secondary | ICD-10-CM | POA: Diagnosis not present

## 2022-09-06 DIAGNOSIS — R972 Elevated prostate specific antigen [PSA]: Secondary | ICD-10-CM | POA: Diagnosis not present

## 2022-09-06 DIAGNOSIS — N401 Enlarged prostate with lower urinary tract symptoms: Secondary | ICD-10-CM | POA: Diagnosis not present

## 2022-09-06 DIAGNOSIS — R350 Frequency of micturition: Secondary | ICD-10-CM | POA: Diagnosis not present

## 2022-09-11 ENCOUNTER — Ambulatory Visit (INDEPENDENT_AMBULATORY_CARE_PROVIDER_SITE_OTHER): Payer: Medicare Other | Admitting: Family Medicine

## 2022-09-11 ENCOUNTER — Encounter: Payer: Self-pay | Admitting: Family Medicine

## 2022-09-11 VITALS — BP 124/78 | HR 92 | Temp 97.4°F | Ht 74.0 in | Wt 242.0 lb

## 2022-09-11 DIAGNOSIS — K625 Hemorrhage of anus and rectum: Secondary | ICD-10-CM

## 2022-09-11 DIAGNOSIS — K5792 Diverticulitis of intestine, part unspecified, without perforation or abscess without bleeding: Secondary | ICD-10-CM

## 2022-09-11 LAB — CBC WITH DIFFERENTIAL/PLATELET
Basophils Absolute: 0 10*3/uL (ref 0.0–0.2)
Basos: 0 %
EOS (ABSOLUTE): 0.1 10*3/uL (ref 0.0–0.4)
Eos: 2 %
Hematocrit: 37.9 % (ref 37.5–51.0)
Hemoglobin: 12.9 g/dL — ABNORMAL LOW (ref 13.0–17.7)
Immature Grans (Abs): 0 10*3/uL (ref 0.0–0.1)
Immature Granulocytes: 0 %
Lymphocytes Absolute: 1 10*3/uL (ref 0.7–3.1)
Lymphs: 15 %
MCH: 33.9 pg — ABNORMAL HIGH (ref 26.6–33.0)
MCHC: 34 g/dL (ref 31.5–35.7)
MCV: 100 fL — ABNORMAL HIGH (ref 79–97)
Monocytes Absolute: 0.8 10*3/uL (ref 0.1–0.9)
Monocytes: 12 %
Neutrophils Absolute: 4.8 10*3/uL (ref 1.4–7.0)
Neutrophils: 71 %
Platelets: 173 10*3/uL (ref 150–450)
RBC: 3.81 x10E6/uL — ABNORMAL LOW (ref 4.14–5.80)
RDW: 12.4 % (ref 11.6–15.4)
WBC: 6.8 10*3/uL (ref 3.4–10.8)

## 2022-09-11 NOTE — Progress Notes (Signed)
Subjective: CC: Follow-up diverticulitis PCP: [name], DO ZDG:LOVF Travis Padilla is a 77 y.o. male presenting to clinic today for:  1.  Acute diverticulitis Patient diagnosed with acute diverticulitis via virtual visit.  He had a CAT scan done with his hematologist/oncologist that demonstrated some mild free pelvic fluid back on 12 December.  He subsequently started developing left-sided lower abdominal pain with bloody and runny stool.  We subsequently ordered a repeat CAT scan which did show acute diverticulitis.  He notes that the blood had initially subsided but he has been seeing scant amounts of it in the toilet lately.  He notes quite a bit of improvement in that left lower quadrant pain.  Reports no nausea, vomiting, fevers.  Unsure if he is having a blood clots in the stool.  He was under the care of of Dr. Laural Golden for gastroenterology previously but that doctor has since retired.  He would be interested in establishing care with Dr. Carlean Purl, who saw him a couple of years ago while he was hospitalized for cardiac issues.   ROS: Per HPI  Allergies  Allergen Reactions   Iohexol Rash    CT scan contrast caused rash and itching    does fine with premeds.   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) ([place]) dx'd 04/17/18   to LN, infrahilar mass, lung  nodule and lt axilla   Obesity, unspecified 03/28/2009   Qualifier: Diagnosis of  By: [name], MD, [name]     Palpitations 03/28/2009   Qualifier: Diagnosis of  By: [name], MD, [name]     Right lower lobe lung mass 04/22/2018   Skin cancer    Sleep apnea    SNORING 03/28/2009   Qualifier: Diagnosis of  By: [name], MD, [name]     Spinal stenosis    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    Current Outpatient Medications:    acetaminophen (TYLENOL) 500 MG tablet, Take 1,000 mg by mouth every 6 (six) hours as needed., Disp: , Rfl:    albuterol (VENTOLIN HFA) 108 (90 Base) MCG/ACT inhaler, Inhale 2 puffs into the lungs every 6 (six) hours as needed for wheezing or shortness of breath., Disp: 8 g, Rfl: 6   ALPRAZolam (XANAX) 0.25 MG tablet, Take 1 tablet (0.25 mg total) by mouth at bedtime as needed for anxiety., Disp: 30 tablet, Rfl: 0   amoxicillin-clavulanate (AUGMENTIN) 875-125 MG tablet, Take 1 tablet by mouth 2 (two) times daily., Disp: 20 tablet, Rfl: 0   Artificial Tear Solution (SOOTHE XP OP), Place 1 drop into both eyes 2 (two) times daily., Disp: , Rfl:    carvedilol (COREG) 25 MG tablet, Take 1 tablet (25 mg total)  by mouth 2 (two) times daily with a meal., Disp: 60 tablet, Rfl: 0   clopidogrel (PLAVIX) 75 MG tablet, TAKE 1 TABLET BY MOUTH EVERY DAY, Disp: 90 tablet, Rfl: 2   [employer] IRON 325 (65 Fe) MG tablet, TAKE 1 TABLET BY MOUTH DAILY, Disp: 90 tablet, Rfl: 1   fluticasone (FLONASE) 50 MCG/ACT nasal spray, Place 1 spray into both nostrils daily. (Patient taking differently: Place 1 spray into both nostrils daily as needed for allergies.), Disp: 16 g, Rfl: 3   levothyroxine (SYNTHROID) 175 MCG tablet, Take 1 tablet (175 mcg total) by mouth daily., Disp: 30 tablet, Rfl: 0   methocarbamol (ROBAXIN) 500 MG tablet, Take 500 mg by mouth every 6 (six) hours as needed for muscle spasms., Disp: , Rfl:    nitroGLYCERIN (NITROSTAT) 0.4 MG SL tablet,  Place 1 tablet (0.4 mg total) under the tongue every 5 (five) minutes as needed for chest pain., Disp: 25 tablet, Rfl: 2   pantoprazole (PROTONIX) 40 MG tablet, TAKE 1 TABLET BY MOUTH DAILY BEFORE BREAKFAST, Disp: 90 tablet, Rfl: 3   polyethylene glycol powder (GLYCOLAX/MIRALAX) 17 GM/SCOOP powder, Take 1 Container by mouth daily., Disp: , Rfl:    PRALUENT 150 MG/ML SOAJ, INJECT 150 MG INTO THE SKIN EVERY 14 (FOURTEEN) DAYS., Disp: 6 mL, Rfl: 3   predniSONE (DELTASONE) 50 MG tablet, Pt to take 50 mg of prednisone on 10/20/21 at 12:00 AM (midnight), 50 mg of prednisone on 10/20/21 at 6:00 AM, and 50 mg of prednisone on 10/20/21 at 12:00 PM (noon). Pt is also to take 50 mg of benadryl on 10/20/21 at 12:00 PM (noon). Please call [phone] with any questions., Disp: 3 tablet, Rfl: 0   senna-docusate (SENNA S) 8.6-50 MG tablet, 1 to 2 twice daily for constipation, Disp: 120 tablet, Rfl: 5   tamsulosin (FLOMAX) 0.4 MG CAPS capsule, Take 0.4 mg by mouth at bedtime., Disp: , Rfl:    diphenhydrAMINE (BENADRYL) 50 MG tablet, Take 1 tablet (50 mg total) by mouth once for 1 dose. Pt to take 50 mg of benadryl on 10/20/21 at 12:00 PM (noon). Please call [phone] with any questions., Disp: 1 tablet, Rfl: 0 Social History   Socioeconomic History   Marital status: Widowed    Spouse name: Not on file   Number of children: 1   Years of education: Not on file   Highest education level: Not on file  Occupational History   Occupation: Government social research officer  Tobacco Use   Smoking status: Former    Packs/day: 0.20    Years: 2.00    Total pack years: 0.40    Types: Cigarettes    Start date: 10/01/1968    Quit date: 1972    Years since quitting: 52.0   Smokeless tobacco: Never  Vaping Use   Vaping Use: Never used  Substance and Sexual Activity   Alcohol use: No    Comment: hx of    Drug use: No   Sexual activity: Not Currently  Other Topics Concern   Not on file  Social History Narrative   Unable to ask  intimate partner violence questions, wife present.    Pt's wife died in Sep 14, 2021.   Social Determinants of Health   Financial Resource Strain: Low Risk  (10/04/2021)   Overall Financial Resource Strain (CARDIA)    Difficulty of Paying Living Expenses: Not hard at all  Food Insecurity: No Food Insecurity (10/04/2021)   Hunger Vital Sign    Worried About Running Out  of Food in the Last Year: Never true    [place] in the Last Year: Never true  Transportation Needs: No Transportation Needs (10/04/2021)   PRAPARE - Hydrologist (Medical): No    Lack of Transportation (Non-Medical): No  Physical Activity: Sufficiently Active (10/04/2021)   Exercise Vital Sign    Days of Exercise per Week: 3 days    Minutes of Exercise per Session: 50 min  Recent Concern: Physical Activity - Insufficiently Active (07/14/2021)   Exercise Vital Sign    Days of Exercise per Week: 2 days    Minutes of Exercise per Session: 60 min  Stress: Stress Concern Present (12/06/2021)   [place]    Feeling of Stress : To some extent  Social Connections: Socially Isolated (10/04/2021)   Social Connection and Isolation Panel [NHANES]    Frequency of Communication with Friends and Family: More than three times a week    Frequency of Social Gatherings with Friends and Family: More than three times a week    Attends Religious Services: Never    Marine scientist or Organizations: No    Attends Archivist Meetings: Never    Marital Status: Widowed  Intimate Partner Violence: Not At Risk (10/04/2021)   Humiliation, Afraid, Rape, and Kick questionnaire    Fear of Current or Ex-Partner: No    Emotionally Abused: No    Physically Abused: No    Sexually Abused: No   Family History  Problem Relation Age of Onset   Heart attack Father    Heart failure Mother    Parkinson's disease Mother    Healthy Sister     Skin cancer Sister    Neuropathy Brother    COPD Brother    Epilepsy Brother    Healthy Sister    Healthy Sister    Colon cancer Neg Hx     Objective: Office vital signs reviewed. BP 124/78   Pulse 92   Temp (!) 97.4 F (36.3 C) (Temporal)   Ht 6\' 2"  (1.88 m)   Wt 242 lb (109.8 kg)   SpO2 96%   BMI 31.07 kg/m   Physical Examination:  General: Awake, alert, well nourished, nontoxic. No acute distress HEENT: No conjunctival pallor appreciated Cardio: regular rate   GI: soft, non-tender, non-distended, bowel sounds present x4, no hepatomegaly, no splenomegaly, no masses   Assessment/ Plan: 77 y.o. male   Diverticulitis - Plan: CBC with Differential, Ambulatory referral to Gastroenterology  BRBPR (bright red blood per rectum) - Plan: CBC with Differential, Ambulatory referral to Gastroenterology  Seems to be healing from diverticulitis.  I have placed a referral to gastroenterology as he will certainly need follow-up colonoscopy and evaluation given recent infection and ongoing bloody stool.  We discussed red flag signs and symptoms warranting sooner evaluation.  In the meantime we will obtain a CBC to look at hemoglobin as this has been borderline in the past.  No orders of the defined types were placed in this encounter.  No orders of the defined types were placed in this encounter.    [name], DO [facility] [phone]

## 2022-09-11 NOTE — Patient Instructions (Signed)
Diverticulitis  Diverticulitis is when small pouches in your colon (large intestine) get infected or swollen. This causes pain in the belly (abdomen) and watery poop (diarrhea). These pouches are called diverticula. The pouches form in people who have a condition called diverticulosis. What are the causes? This condition may be caused by poop (stool) that gets trapped in the pouches in your colon. The poop lets germs (bacteria) grow in the pouches. This causes the infection. What increases the risk? You are more likely to get this condition if you have small pouches in your colon. The risk is higher if: You are overweight or very overweight (obese). You do not exercise enough. You drink alcohol. You smoke or use products with tobacco in them. You eat a diet that has a lot of red meat such as beef, pork, or lamb. You eat a diet that does not have enough fiber in it. You are older than 76 years of age. What are the signs or symptoms? Pain in the belly. Pain is often on the left side, but it may be in other areas. Fever and feeling cold. Feeling like you may vomit. Vomiting. Having cramps. Feeling full. Changes to how often you poop. Blood in your poop. How is this treated? Most cases are treated at home by: Taking over-the-counter pain medicines. Following a clear liquid diet. Taking antibiotic medicines. Resting. Very bad cases may need to be treated at a hospital. This may include: Not eating or drinking. Taking prescription pain medicine. Getting antibiotic medicines through an IV tube. Getting fluid and food through an IV tube. Having surgery. When you are feeling better, your doctor may tell you to have a test to check your colon (colonoscopy). Follow these instructions at home: Medicines Take over-the-counter and prescription medicines only as told by your doctor. These include: Antibiotics. Pain medicines. Fiber pills. Probiotics. Stool softeners. If you were  prescribed an antibiotic medicine, take it as told by your doctor. Do not stop taking the antibiotic even if you start to feel better. Ask your doctor if the medicine prescribed to you requires you to avoid driving or using machinery. Eating and drinking  Follow a diet as told by your doctor. When you feel better, your doctor may tell you to change your diet. You may need to eat a lot of fiber. Fiber makes it easier to poop (have a bowel movement). Foods with fiber include: Berries. Beans. Lentils. Green vegetables. Avoid eating red meat. General instructions Do not use any products that contain nicotine or tobacco, such as cigarettes, e-cigarettes, and chewing tobacco. If you need help quitting, ask your doctor. Exercise 3 or more times a week. Try to get 30 minutes each time. Exercise enough to sweat and make your heart beat faster. Keep all follow-up visits as told by your doctor. This is important. Contact a doctor if: Your pain does not get better. You are not pooping like normal. Get help right away if: Your pain gets worse. Your symptoms do not get better. Your symptoms get worse very fast. You have a fever. You vomit more than one time. You have poop that is: Bloody. Black. Tarry. Summary This condition happens when small pouches in your colon get infected or swollen. Take medicines only as told by your doctor. Follow a diet as told by your doctor. Keep all follow-up visits as told by your doctor. This is important. This information is not intended to replace advice given to you by your health care provider. Make sure   you discuss any questions you have with your health care provider. Document Revised: 06/08/2019 Document Reviewed: 06/08/2019 Elsevier Patient Education  2023 [employer]  

## 2022-09-16 ENCOUNTER — Other Ambulatory Visit: Payer: Self-pay | Admitting: Family Medicine

## 2022-09-16 DIAGNOSIS — E039 Hypothyroidism, unspecified: Secondary | ICD-10-CM

## 2022-09-17 ENCOUNTER — Encounter: Payer: Self-pay | Admitting: Adult Health

## 2022-09-17 ENCOUNTER — Ambulatory Visit (INDEPENDENT_AMBULATORY_CARE_PROVIDER_SITE_OTHER): Payer: Medicare Other | Admitting: Adult Health

## 2022-09-17 VITALS — BP 110/70 | HR 89 | Temp 98.1°F | Ht 74.0 in | Wt 240.2 lb

## 2022-09-17 DIAGNOSIS — C3491 Malignant neoplasm of unspecified part of right bronchus or lung: Secondary | ICD-10-CM | POA: Diagnosis not present

## 2022-09-17 DIAGNOSIS — J449 Chronic obstructive pulmonary disease, unspecified: Secondary | ICD-10-CM | POA: Diagnosis not present

## 2022-09-17 DIAGNOSIS — G8929 Other chronic pain: Secondary | ICD-10-CM | POA: Diagnosis not present

## 2022-09-17 DIAGNOSIS — M546 Pain in thoracic spine: Secondary | ICD-10-CM | POA: Diagnosis not present

## 2022-09-17 NOTE — Assessment & Plan Note (Signed)
Currently stable COPD.  Patient is on no maintenance inhaler.  Previous PFTs showed stable to improved lung function.  No perceived benefit with Trelegy.  May use albuterol as needed.  Vaccines are up-to-date  Plan  Patient Instructions  Albuterol inhaler As needed   Activity as tolerated.  Refer to Ortho for neck and shoulder pain-[facility] .  Follow up in 1 year with Travis Padilla or Travis Padilla  and As needed   Please contact office for sooner follow up if symptoms do not improve or worsen or seek emergency care

## 2022-09-17 NOTE — Progress Notes (Signed)
Yes sign  @Patient  ID: Travis Padilla, male    DOB: Feb 09, 1946, 77 y.o.   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  Stable radiation changes in the paramediastinal right lung, stable left axillary soft tissue nodule   Spirometry test at the [facility] clinic in the spring 2019 showed moderate airflow obstruction with a ratio of 68% and FEV1 of 74% predicted Methacholine challenge test in the spring 2019 at [facility] clinic was negative Exhaled nitric oxide testing at [facility] clinic in the spring 2019 was 32 ppm   PFT 05/2018 PFT normal , DLCO 73%   10/2021  PFTs that showed was normal lung function with no significant airflow obstruction or restriction.  Normal diffusing capacity with an FEV1 at 103%, ratio 77, FVC 98%, no significant bronchodilator response, DLCO 83%. Stable lung function with improved diffusing capacity.   Lung Cancer timeline:  Palliative radiotherapy to the right infrahilar mass as well as the axillary mass under the care of Dr. Lisbeth Renshaw. 2) Systemic chemotherapy with carboplatin for AUC of 5, paclitaxel 175 mg/M2 and Keytruda 200 mg IV every 3 weeks status post 4 cycles. 3) Maintenance immunotherapy with single agent Keytruda 200 mg IV every 3 weeks status post 41 cycles.  His treatment is currently on hold secondary to intolerance.  09/17/2022 Follow up ; COPD with emphysema, metastatic lung cancer Patient presents for a  follow-up visit.  Patient has underlying COPD with emphysema.  Last seen November 02, 2021.  Previous PFTs have shows essentially normal lung function with no significant airflow obstruction or restriction.  Previously on Trelegy inhaler with no perceived benefits.  Patient has no significant cough or wheezing.  Says he does get some shortness of breath but considers it minimal.  Patient remains active goes to the gym at least 3-4 times a week, does yoga.  Patient has a known history of metastatic lung cancer.  He is followed by oncology with serial CT chest in April 2023 showing enlargement of left upper lobe lung mass measuring 3.9 x 3.4 cm.  Patient underwent SBRT in June 2023.  Patient says he tolerated okay. Most recent serial CT chest abdomen and pelvis showed evolving progressive radiation change in left upper lobe, partially obscuring left upper lobe nodule which is felt to be decreased in size, decreased left axillary node, stable radiation changes in the right perihilar region and medial lower lobe.  No acute evidence of metastatic disease in the abdomen or pelvis.  Patient does complain over the last year he has had ongoing left shoulder back and neck pain that continues to be quite bothersome for him.  It hurts to move his left arm turn his neck.  Patient says he has had other back issues and went to physical therapy while in physical therapy had them to work on his left shoulder but has not improved.  He has had no direct evaluation or treatment for this ongoing discomfort.  Pain starts in his left shoulder radiates around to  his back and mid neck and mid spine.  Patient denies any rash or arm weakness.  No palpitations or syncope.  Left shoulder is sore to touch at times.  And has a tenderness along the subscapular region on the left. Patient was seen in June 2023 by orthopedics with ongoing low back pain.  MRI showed mild lumbar spondylosis without acute process noted.  Allergies  Allergen  Reactions   Iohexol Rash    CT scan contrast caused rash and itching    does fine with premeds.   Pravastatin     Numbness in feet    Iodine Hives and Rash    CT scan contrast caused rash and itching     Immunization History  Administered Date(s) Administered   Influenza Nasal 06/29/2016   Influenza Split 05/14/2012   Influenza, High Dose Seasonal PF 08/03/2014, 05/29/2015, 07/22/2017, 06/11/2018, 05/05/2019, 06/28/2020, 05/16/2021, 04/24/2022   Influenza-Unspecified 06/29/2016   PFIZER Comirnaty(Gray Top)Covid-19 Tri-Sucrose Vaccine 02/14/2021   PFIZER(Purple Top)SARS-COV-2 Vaccination 10/15/2019, 06/13/2020   PNEUMOCOCCAL CONJUGATE-20 04/18/2022   Pneumococcal Conjugate-13 03/30/2015   Pneumococcal Polysaccharide-23 07/21/2012   Td 02/23/2015   Zoster Recombinat (Shingrix) 11/01/2021, 04/18/2022   Zoster, Live 07/21/2012    Past Medical History:  Diagnosis Date   Abnormal nuclear stress test    December, 2013   Anemia    Axillary adenopathy 04/24/2018   CAD (coronary artery disease)    Mild nonobstructive plaque in cath 2013   Chest pain    December, 2013   Cough with hemoptysis 04/22/2018   Dizziness    Dyslipidemia 07/14/2019   Encounter for antineoplastic immunotherapy 05/13/2018   Gout    Hemorrhoid    Hyperlipidemia    Hypertension    Hypothyroidism (acquired) 08/11/2018   Metastasis to lung ([place]) 05/19/2018   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) ([place]) dx'd 04/17/18   to LN, infrahilar mass, lung nodule and lt axilla   Obesity, unspecified 03/28/2009   Qualifier: Diagnosis of  By: [name], MD, [name]     Palpitations 03/28/2009   Qualifier: Diagnosis of  By: [name], MD, [name]     Right lower lobe lung mass 04/22/2018   Skin cancer    Sleep apnea    SNORING 03/28/2009   Qualifier: Diagnosis of  By: [name], MD, [name]     Spinal stenosis    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[place]) 05/13/2018     Tobacco History: Social History   Tobacco Use  Smoking Status Former   Packs/day: 0.20   Years: 2.00   Total pack years: 0.40   Types: Cigarettes   Start date: 10/01/1968   Quit date: 1972   Years since quitting: 52.0  Smokeless Tobacco Never   Counseling given: Not Answered   Outpatient Medications Prior to Visit  Medication Sig Dispense Refill   acetaminophen (TYLENOL) 500 MG tablet Take 1,000 mg by mouth every 6 (six) hours as needed.     albuterol (VENTOLIN HFA) 108 (90 Base) MCG/ACT inhaler Inhale 2 puffs into the lungs every 6 (six) hours as needed for wheezing or shortness of breath. 8 g 6   ALPRAZolam (XANAX) 0.25 MG tablet Take 1 tablet (0.25 mg total) by mouth at bedtime as needed for anxiety. 30 tablet 0   Artificial Tear Solution (SOOTHE XP OP) Place 1 drop into both eyes 2 (two) times daily.     carvedilol (COREG) 25 MG tablet Take 1 tablet (25 mg total) by mouth  2 (two) times daily with a meal. 60 tablet 0   clopidogrel (PLAVIX) 75 MG tablet TAKE 1 TABLET BY MOUTH EVERY DAY 90 tablet 2   [employer] IRON 325 (65 Fe) MG tablet TAKE 1 TABLET BY MOUTH DAILY 90 tablet 1   fluticasone (FLONASE) 50 MCG/ACT nasal spray Place 1 spray into both nostrils daily. (Patient taking differently: Place 1 spray into both nostrils daily as needed for allergies.) 16 g 3   levothyroxine (SYNTHROID) 175 MCG tablet TAKE 1 TABLET BY MOUTH EVERY DAY 90 tablet 3   methocarbamol (ROBAXIN) 500 MG tablet Take 500 mg by mouth every 6 (six) hours as needed for muscle spasms.     nitroGLYCERIN (NITROSTAT) 0.4 MG SL tablet Place 1 tablet (0.4 mg total) under the tongue every 5 (five) minutes as needed for chest pain. 25 tablet 2   pantoprazole (PROTONIX) 40 MG tablet TAKE 1 TABLET BY MOUTH DAILY BEFORE BREAKFAST 90 tablet 3   polyethylene glycol powder (GLYCOLAX/MIRALAX) 17 GM/SCOOP powder Take 1 Container by mouth daily.     PRALUENT 150 MG/ML SOAJ INJECT 150 MG INTO THE SKIN EVERY 14 (FOURTEEN) DAYS. 6  mL 3   senna-docusate (SENNA S) 8.6-50 MG tablet 1 to 2 twice daily for constipation 120 tablet 5   tamsulosin (FLOMAX) 0.4 MG CAPS capsule Take 0.4 mg by mouth at bedtime.     diphenhydrAMINE (BENADRYL) 50 MG tablet Take 1 tablet (50 mg total) by mouth once for 1 dose. Pt to take 50 mg of benadryl on 10/20/21 at 12:00 PM (noon). Please call [phone] with any questions. 1 tablet 0   predniSONE (DELTASONE) 50 MG tablet Pt to take 50 mg of prednisone on 10/20/21 at 12:00 AM (midnight), 50 mg of prednisone on 10/20/21 at 6:00 AM, and 50 mg of prednisone on 10/20/21 at 12:00 PM (noon). Pt is also to take 50 mg of benadryl on 10/20/21 at 12:00 PM (noon). Please call [phone] with any questions. (Patient not taking: Reported on 09/17/2022) 3 tablet 0   amoxicillin-clavulanate (AUGMENTIN) 875-125 MG tablet Take 1 tablet by mouth 2 (two) times daily. (Patient not taking: Reported on 09/17/2022) 20 tablet 0   No facility-administered medications prior to visit.     Review of Systems:   Constitutional:   No  weight loss, night sweats,  Fevers, chills, fatigue, or  lassitude.  HEENT:   No headaches,  Difficulty swallowing,  Tooth/dental problems, or  Sore throat,                No sneezing, itching, ear ache, nasal congestion, post nasal drip,   CV:  No chest pain,  Orthopnea, PND, swelling in lower extremities, anasarca, dizziness, palpitations, syncope.   GI  No heartburn, indigestion, abdominal pain, nausea, vomiting, diarrhea, change in bowel habits, loss of appetite, bloody stools.   Resp:   No excess mucus, no productive cough,  No non-productive cough,  No coughing up of blood.  No change in color of mucus.  No wheezing.  No chest wall deformity  Skin: no rash or lesions.  GU: no dysuria, change in color of urine, no urgency or frequency.  No flank pain, no hematuria   MS: Left shoulder, neck and back pain   Physical Exam  BP 110/70 (BP Location: Left Arm, Patient Position: Sitting, Cuff  Size: Normal)   Pulse 89   Temp 98.1 F (36.7 C) (Oral)   Ht 6\' 2"  (1.88 m)   Wt 240 lb 3.2 oz (109  kg)   SpO2 97%   BMI 30.84 kg/m   GEN: A/Ox3; pleasant , NAD, well nourished    HEENT:  [place]/AT,   NOSE-clear, THROAT-clear, no lesions, no postnasal drip or exudate noted.   NECK:  Supple w/ fair ROM; no JVD; normal carotid impulses w/o bruits; no thyromegaly or nodules palpated; no lymphadenopathy.    RESP  Clear  P & A; w/o, wheezes/ rales/ or rhonchi. no accessory muscle use, no dullness to percussion  CARD:  RRR, no m/r/g, no peripheral edema, pulses intact, no cyanosis or clubbing.  GI:   Soft & nt; nml bowel sounds; no organomegaly or masses detected.   Musco: Warm bil, no deformities or joint swelling noted.  Normal grips.  Tenderness along the posterior left shoulder subscapular region.  No obvious deformities are noted  Neuro: alert, no focal deficits noted.    Skin: Warm, no lesions or rashes    Lab Results:  CBC     BNP   ProBNP No results found for: "PROBNP"   Imaging: CT Abdomen Pelvis W Contrast  Result Date: 08/30/2022 CLINICAL DATA:  Left lower quadrant abdominal pain EXAM: CT ABDOMEN AND PELVIS WITH CONTRAST TECHNIQUE: Multidetector CT imaging of the abdomen and pelvis was performed using the standard protocol following bolus administration of intravenous contrast. RADIATION DOSE REDUCTION: This exam was performed according to the departmental dose-optimization program which includes automated exposure control, adjustment of the mA and/or kV according to patient size and/or use of iterative reconstruction technique. CONTRAST:  132mL OMNIPAQUE IOHEXOL 300 MG/ML  SOLN COMPARISON:  CT abdomen and pelvis dated August 21, 2022 FINDINGS: Lower chest: Partially visualized linear opacities of the right lung base, likely postradiation change. Hepatobiliary: Stable subcentimeter low-attenuation lesion of the left hepatic lobe, likely a simple cyst. No suspicious  focal liver abnormality is seen. No gallstones, gallbladder wall thickening, or biliary dilatation. Pancreas: Unremarkable. No pancreatic ductal dilatation or surrounding inflammatory changes. Spleen: Normal in size without focal abnormality. Adrenals/Urinary Tract: Bilateral adrenal glands are unremarkable. No hydronephrosis. Bilateral nonobstructing renal stones. Stable simple appearing cyst of the upper pole of the left kidney, no specific follow-up imaging is recommended. Bladder is unremarkable. Stomach/Bowel: Wall thickening of the sigmoid colon with inflamed diverticula, findings are new when compared with recent prior exam. No evidence of abscess or extraluminal air. No evidence of obstruction. Normal appearing appendix. Vascular/Lymphatic: Aortic atherosclerosis. No enlarged abdominal or pelvic lymph nodes. Reproductive: Unchanged mild prostatomegaly. Other: No abdominal wall hernia or abnormality. No abdominopelvic ascites. Musculoskeletal: No acute or significant osseous findings. IMPRESSION: 1. Findings compatible with acute uncomplicated diverticulitis. 2. Bilateral nonobstructing renal stones. 3. Aortic Atherosclerosis (ICD10-I70.0). These results will be called to the ordering clinician or representative by the Radiologist Assistant, and communication documented in the PACS or [employer]. Electronically Signed   By: Yetta Glassman M.D.   On: 08/30/2022 16:34   CT CHEST ABDOMEN PELVIS W CONTRAST  Result Date: 08/21/2022 CLINICAL DATA:  Non-small-cell lung cancer, restaging. Prior radiation therapy. * Tracking Code: BO * EXAM: CT CHEST, ABDOMEN, AND PELVIS WITH CONTRAST TECHNIQUE: Multidetector CT imaging of the chest, abdomen and pelvis was performed following the standard protocol during bolus administration of intravenous contrast. RADIATION DOSE REDUCTION: This exam was performed according to the departmental dose-optimization program which includes automated exposure control,  adjustment of the mA and/or kV according to patient size and/or use of iterative reconstruction technique. CONTRAST:  165mL OMNIPAQUE IOHEXOL 300 MG/ML  SOLN COMPARISON:  04/17/2022 FINDINGS: CT CHEST  FINDINGS Cardiovascular: Right Port-A-Cath tip high right atrium. Aortic atherosclerosis. Tortuous thoracic aorta. Normal heart size, without pericardial effusion. Three vessel coronary artery calcification. No central pulmonary embolism, on this non-dedicated study. Pulmonary artery enlargement, outflow tract 3.4 cm. Mediastinum/Nodes: No supraclavicular adenopathy. Lateral left axillary node measures 1.1 x 1.6 cm on 07/02 versus 1.3 x 1.7 cm on the prior. No mediastinal or hilar adenopathy. Lungs/Pleura: No pleural fluid. Similar appearance of right perihilar and medial right lower lobe radiation fibrosis with architectural distortion and traction bronchiectasis. Further increase in medial left upper lobe radiation induced consolidation and ground-glass. Consolidation surrounds and partially obscures the anterior left upper lobe lung lesion, which is estimated at 2.3 x 2.3 cm on 70/4 versus 2.6 x 2.3 cm on the prior exam (when remeasured). Musculoskeletal: No acute osseous abnormality. CT ABDOMEN PELVIS FINDINGS Hepatobiliary: Posterior segment 2 hypoattenuating liver lesion of 7 mm is well-circumscribed, most consistent with a complex cyst. More simple in appearance and larger on 08/18/2021. No suspicious liver lesion or biliary abnormality. Pancreas: Normal, without mass or ductal dilatation. Spleen: Normal in size, without focal abnormality. Adrenals/Urinary Tract: Normal adrenal glands. Bilateral renal collecting system stones of up to 3-4 mm. Upper pole left renal lesion is consistent with a cyst at 9 mm . In the absence of clinically indicated signs/symptoms require(s) no independent follow-up. No hydronephrosis. Normal urinary bladder. Stomach/Bowel: Normal stomach, without wall thickening. Extensive colonic  diverticulosis. Normal terminal ileum. Normal small bowel. Vascular/Lymphatic: Aortic atherosclerosis. No abdominopelvic adenopathy. Reproductive: Mild prostatomegaly. Other: Trace fluid in the pelvis is nonspecific on 112/2. No free intraperitoneal air. No evidence of omental or peritoneal disease. Musculoskeletal: Degenerative disc disease at the lumbosacral junction. IMPRESSION: 1. Evolving/progressive radiation change in the left upper lobe. This partially obscures the left upper lobe nodule, which is felt to be decreased in size. 2. Decreased size of a left axillary mildly enlarged node. 3. Similar appearance of radiation change in the right perihilar region and medial lower lobe. 4. No acute process or evidence of metastatic disease in the abdomen or pelvis. 5. Bilateral nephrolithiasis 6. Coronary artery atherosclerosis. Aortic Atherosclerosis (ICD10-I70.0). 7. Pulmonary artery enlargement suggests pulmonary arterial hypertension. 8. Normal thoracic aortic caliber. 9. Trace free pelvic fluid, nonspecific. Electronically Signed   By: Abigail Miyamoto M.D.   On: 08/21/2022 10:32    heparin lock flush 100 unit/mL     Date Action Dose Route User   08/23/2022 1130 Given 500 Units Intracatheter Ardeen Garland, RN      sodium chloride flush (NS) 0.9 % injection 10 mL     Date Action Dose Route User   08/23/2022 1130 Given 10 mL Marya Amsler, RN          Latest Ref Rng & Units 11/02/2021    8:55 AM 05/30/2018   10:43 AM  PFT Results  FVC-Pre L 4.56  4.40  C  FVC-Predicted Pre % 98  93  C  FVC-Post L 4.53  4.26  C  FVC-Predicted Post % 98  90  C  Pre FEV1/FVC % % 76  80  C  Post FEV1/FCV % % 77  80  C  FEV1-Pre L 3.45  3.52  C  FEV1-Predicted Pre % 102  101  C  FEV1-Post L 3.47  3.40  C  DLCO uncorrected ml/min/mmHg 22.37  25.71  C  DLCO UNC% % 83  73  C  DLCO corrected ml/min/mmHg 22.37  27.59  C  DLCO COR %Predicted %  83  78  C  DLVA Predicted % 91  86  C  TLC L 6.87     TLC % Predicted % 92    RV % Predicted % 90      C Corrected result    No results found for: "NITRICOXIDE"      Assessment & Plan:   COPD (chronic obstructive pulmonary disease) (HCC) Currently stable COPD.  Patient is on no maintenance inhaler.  Previous PFTs showed stable to improved lung function.  No perceived benefit with Trelegy.  May use albuterol as needed.  Vaccines are up-to-date  Plan  Patient Instructions  Albuterol inhaler As needed   Activity as tolerated.  Refer to Ortho for neck and shoulder pain-[facility] .  Follow up in 1 year with Dr. Valeta Harms or Leimomi Zervas NP  and As needed   Please contact office for sooner follow up if symptoms do not improve or worsen or seek emergency care      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[place]) Stage IV lung cancer status post chemo, radiation and immunotherapy.  Beryle Flock was stopped due to possible encephalitis.  CT scan April 2023 showed enlarged left upper lobe nodule.  Now status post SBRT June 2023.  Most recent serial CT scan December 2023 showed stable radiation changes. Continue follow-up with oncology  Back pain Left shoulder and upper back spine pain going on over the last year.  Recommend referral to orthopedics for further evaluation.  Most recent CT scan showed no musculoskeletal lesions.  Lumbar MRI in June showed no lesions.  Most likely will need dedicated imaging of the left shoulder and upper back and neck for further evaluation.  Patient does have known history of stage IV lung cancer need to make sure this is not secondary metastatic disease.  Plan  Patient Instructions  Albuterol inhaler As needed   Activity as tolerated.  Refer to Ortho for neck and shoulder pain-[facility] .  Follow up in 1 year with Dr. Valeta Harms or Karlos Scadden NP  and As needed   Please contact office for sooner follow up if symptoms do not improve or worsen or seek emergency care        Rexene Edison, NP 09/17/2022

## 2022-09-17 NOTE — Patient Instructions (Addendum)
Albuterol inhaler As needed   Activity as tolerated.  Refer to Ortho for neck and shoulder pain-[facility] .  Follow up in 1 year with Dr. Valeta Harms or Willadene Mounsey NP  and As needed   Please contact office for sooner follow up if symptoms do not improve or worsen or seek emergency care

## 2022-09-17 NOTE — Assessment & Plan Note (Signed)
Stage IV lung cancer status post chemo, radiation and immunotherapy.  Travis Padilla was stopped due to possible encephalitis.  CT scan April 2023 showed enlarged left upper lobe nodule.  Now status post SBRT June 2023.  Most recent serial CT scan December 2023 showed stable radiation changes. Continue follow-up with oncology

## 2022-09-17 NOTE — Assessment & Plan Note (Signed)
Left shoulder and upper back spine pain going on over the last year.  Recommend referral to orthopedics for further evaluation.  Most recent CT scan showed no musculoskeletal lesions.  Lumbar MRI in June showed no lesions.  Most likely will need dedicated imaging of the left shoulder and upper back and neck for further evaluation.  Patient does have known history of stage IV lung cancer need to make sure this is not secondary metastatic disease.  Plan  Patient Instructions  Albuterol inhaler As needed   Activity as tolerated.  Refer to Ortho for neck and shoulder pain-[facility] .  Follow up in 1 year with Dr. Valeta Harms or Mailey Landstrom NP  and As needed   Please contact office for sooner follow up if symptoms do not improve or worsen or seek emergency care

## 2022-09-19 ENCOUNTER — Other Ambulatory Visit: Payer: Self-pay | Admitting: *Deleted

## 2022-09-19 DIAGNOSIS — R899 Unspecified abnormal finding in specimens from other organs, systems and tissues: Secondary | ICD-10-CM

## 2022-10-03 ENCOUNTER — Other Ambulatory Visit: Payer: Medicare Other

## 2022-10-03 DIAGNOSIS — R899 Unspecified abnormal finding in specimens from other organs, systems and tissues: Secondary | ICD-10-CM | POA: Diagnosis not present

## 2022-10-03 LAB — CBC WITH DIFFERENTIAL/PLATELET
Basophils Absolute: 0 10*3/uL (ref 0.0–0.2)
Basos: 0 %
EOS (ABSOLUTE): 0 10*3/uL (ref 0.0–0.4)
Eos: 1 %
Hematocrit: 37.6 % (ref 37.5–51.0)
Hemoglobin: 12.7 g/dL — ABNORMAL LOW (ref 13.0–17.7)
Immature Grans (Abs): 0 10*3/uL (ref 0.0–0.1)
Immature Granulocytes: 0 %
Lymphocytes Absolute: 0.5 10*3/uL — ABNORMAL LOW (ref 0.7–3.1)
Lymphs: 8 %
MCH: 34 pg — ABNORMAL HIGH (ref 26.6–33.0)
MCHC: 33.8 g/dL (ref 31.5–35.7)
MCV: 101 fL — ABNORMAL HIGH (ref 79–97)
Monocytes Absolute: 1.2 10*3/uL — ABNORMAL HIGH (ref 0.1–0.9)
Monocytes: 20 %
Neutrophils Absolute: 4.5 10*3/uL (ref 1.4–7.0)
Neutrophils: 71 %
Platelets: 113 10*3/uL — ABNORMAL LOW (ref 150–450)
RBC: 3.73 x10E6/uL — ABNORMAL LOW (ref 4.14–5.80)
RDW: 12 % (ref 11.6–15.4)
WBC: 6.2 10*3/uL (ref 3.4–10.8)

## 2022-10-04 ENCOUNTER — Telehealth: Payer: Self-pay | Admitting: Family Medicine

## 2022-10-04 ENCOUNTER — Ambulatory Visit (INDEPENDENT_AMBULATORY_CARE_PROVIDER_SITE_OTHER): Payer: Medicare Other | Admitting: Family

## 2022-10-04 ENCOUNTER — Encounter: Payer: Self-pay | Admitting: Family

## 2022-10-04 VITALS — BP 125/78 | HR 88 | Temp 97.7°F | Ht 74.0 in | Wt 242.2 lb

## 2022-10-04 DIAGNOSIS — R051 Acute cough: Secondary | ICD-10-CM | POA: Diagnosis not present

## 2022-10-04 DIAGNOSIS — J449 Chronic obstructive pulmonary disease, unspecified: Secondary | ICD-10-CM | POA: Diagnosis not present

## 2022-10-04 DIAGNOSIS — C3491 Malignant neoplasm of unspecified part of right bronchus or lung: Secondary | ICD-10-CM | POA: Diagnosis not present

## 2022-10-04 LAB — VERITOR FLU A/B WAIVED
Influenza A: NEGATIVE
Influenza B: NEGATIVE

## 2022-10-04 MED ORDER — PREDNISONE 10 MG (21) PO TBPK: ORAL_TABLET | ORAL | 0 refills | Status: DC

## 2022-10-04 NOTE — Patient Instructions (Signed)

## 2022-10-04 NOTE — Progress Notes (Signed)
Subjective:    Patient ID: Travis Padilla, male    DOB: [birth year], 77 y.o.   MRN: [id number]  Chief Complaint  Patient presents with   Cough    Started on Tuesday. Cough has started pain in muscles taken mucenix Not sure he thinks ;he needs to test for flu or covid    Nasal Congestion   Shortness of Breath   Diarrhea    Cough This is a new problem. The current episode started in the past 7 days. The problem has been unchanged. The problem occurs every few minutes. The cough is Non-productive. Associated symptoms include chills, ear congestion, ear pain, myalgias, nasal congestion, a sore throat and shortness of breath. Pertinent negatives include no fever or headaches. He has tried rest and OTC cough suppressant for the symptoms. The treatment provided mild relief.  Shortness of Breath Associated symptoms include ear pain and a sore throat. Pertinent negatives include no fever or headaches.  Diarrhea  Associated symptoms include chills, coughing and myalgias. Pertinent negatives include no fever or headaches.      Review of Systems  Constitutional:  Positive for chills. Negative for fever.  HENT:  Positive for ear pain and sore throat.   Respiratory:  Positive for cough and shortness of breath.   Gastrointestinal:  Positive for diarrhea.  Musculoskeletal:  Positive for myalgias.  Neurological:  Negative for headaches.  All other systems reviewed and are negative.      Objective:   Physical Exam Vitals reviewed.  Constitutional:      General: He is not in acute distress.    Appearance: He is well-developed. He is obese.  HENT:     Head: Normocephalic.     Right Ear: External ear normal.     Left Ear: External ear normal.  Eyes:     General:        Right eye: No discharge.        Left eye: No discharge.     Pupils: Pupils are equal, round, and reactive to light.  Neck:     Thyroid: No thyromegaly.  Cardiovascular:     Rate and Rhythm: Normal rate and regular  rhythm.     Heart sounds: Normal heart sounds. No murmur heard. Pulmonary:     Effort: Pulmonary effort is normal. No respiratory distress.     Breath sounds: Rhonchi present. No wheezing.  Abdominal:     General: Bowel sounds are normal. There is no distension.     Palpations: Abdomen is soft.     Tenderness: There is no abdominal tenderness.  Musculoskeletal:        General: No tenderness. Normal range of motion.     Cervical back: Normal range of motion and neck supple.  Skin:    General: Skin is warm and dry.     Findings: No erythema or rash.  Neurological:     Mental Status: He is alert and oriented to person, place, and time.     Cranial Nerves: No cranial nerve deficit.     Deep Tendon Reflexes: Reflexes are normal and symmetric.  Psychiatric:        Behavior: Behavior normal.        Thought Content: Thought content normal.        Judgment: Judgment normal.     BP 125/78   Pulse 88   Temp 97.7 F (36.5 C) (Temporal)   Ht 6\' 2"  (1.88 m)   Wt 242 lb 3.2 oz (109.9 kg)  SpO2 97%   BMI 31.10 kg/m       Assessment & Plan:  SHREY BOIKE comes in today with chief complaint of Cough (Started on Tuesday. Cough has started pain in muscles taken mucenix Not sure he thinks ;he needs to test for flu or covid ), Nasal Congestion, Shortness of Breath, and Diarrhea   Diagnosis and orders addressed:  1. Acute cough - Veritor Flu A/B Waived - COVID-19, Flu A+B and RSV - predniSONE (STERAPRED UNI-PAK 21 TAB) 10 MG (21) TBPK tablet; Use as directed  Dispense: 21 tablet; Refill: 0  2. Chronic obstructive pulmonary disease, unspecified COPD type (HCC) - predniSONE (STERAPRED UNI-PAK 21 TAB) 10 MG (21) TBPK tablet; Use as directed  Dispense: 21 tablet; Refill: 0  3. 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HCC) - predniSONE (STERAPRED UNI-PAK 21 TAB) 10 MG (21) TBPK tablet; Use as directed  Dispense: 21 tablet; Refill: 0  Flu negative in office Pt will go home and  take home COVID test and let us know. Symptoms looks viral.  Rest Force fluids Prednisone  If symptoms worsen call back and will send in doxycycline   Evelina Dun, FNP

## 2022-10-05 ENCOUNTER — Ambulatory Visit: Payer: Medicare Other | Admitting: Family Medicine

## 2022-10-05 MED ORDER — MOLNUPIRAVIR EUA 200MG CAPSULE: 4.0000 | ORAL_CAPSULE | Freq: Two times a day (BID) | ORAL | 0 refills | Status: AC

## 2022-10-05 NOTE — Telephone Encounter (Signed)
Wasn't sure if you already addressed this

## 2022-10-05 NOTE — Telephone Encounter (Signed)
COVID positive, rest, force fluids, tylenol as needed, Quarantine for at least 5 days and you are fever free, then must wear a mask out in public from day 7-53, report any worsening symptoms such as increased shortness of breath, swelling, or continued high fevers. Possible adverse effects discussed with antivirals.

## 2022-10-05 NOTE — Telephone Encounter (Signed)
Hadn't heard back from message yesterday. Last night was not able to sleep, had cold sweats and sheets were all wet. Please call patient.

## 2022-10-05 NOTE — Telephone Encounter (Signed)
Patient aware going to send antivirals to [facility]

## 2022-10-08 ENCOUNTER — Other Ambulatory Visit: Payer: Self-pay | Admitting: Family Medicine

## 2022-10-08 DIAGNOSIS — D649 Anemia, unspecified: Secondary | ICD-10-CM

## 2022-10-08 DIAGNOSIS — R718 Other abnormality of red blood cells: Secondary | ICD-10-CM

## 2022-10-08 LAB — COVID-19, FLU A+B AND RSV
Influenza A, NAA: NOT DETECTED
Influenza B, NAA: NOT DETECTED
RSV, NAA: NOT DETECTED
SARS-CoV-2, NAA: DETECTED — AB

## 2022-10-17 DIAGNOSIS — J209 Acute bronchitis, unspecified: Secondary | ICD-10-CM | POA: Diagnosis not present

## 2022-10-17 DIAGNOSIS — J0100 Acute maxillary sinusitis, unspecified: Secondary | ICD-10-CM | POA: Diagnosis not present

## 2022-10-18 ENCOUNTER — Telehealth: Payer: Medicare Other

## 2022-10-22 ENCOUNTER — Other Ambulatory Visit: Payer: Self-pay

## 2022-10-22 ENCOUNTER — Inpatient Hospital Stay: Payer: Medicare Other | Attending: Internal Medicine

## 2022-10-22 DIAGNOSIS — Z95828 Presence of other vascular implants and grafts: Secondary | ICD-10-CM

## 2022-10-22 DIAGNOSIS — C7801 Secondary malignant neoplasm of right lung: Secondary | ICD-10-CM

## 2022-10-22 DIAGNOSIS — C3491 Malignant neoplasm of unspecified part of right bronchus or lung: Secondary | ICD-10-CM | POA: Diagnosis not present

## 2022-10-22 MED ORDER — HEPARIN SOD (PORK) LOCK FLUSH 100 UNIT/ML IV SOLN
500.0000 [IU] | Freq: Once | INTRAVENOUS | Status: AC
  Administered 2022-10-22: 500 [IU]

## 2022-10-22 MED ORDER — SODIUM CHLORIDE 0.9% FLUSH
10.0000 mL | Freq: Once | INTRAVENOUS | Status: AC
  Administered 2022-10-22: 10 mL

## 2022-11-07 ENCOUNTER — Telehealth: Payer: Self-pay | Admitting: Family Medicine

## 2022-11-07 NOTE — Telephone Encounter (Signed)
[facility] to schedule their annual wellness visit. Appointment made for 11/22/2022.  Thank you,  Colletta [place],  [facility] Direct Dial ??[phone]

## 2022-11-20 ENCOUNTER — Other Ambulatory Visit: Payer: Self-pay | Admitting: Cardiology

## 2022-11-22 ENCOUNTER — Ambulatory Visit (INDEPENDENT_AMBULATORY_CARE_PROVIDER_SITE_OTHER): Payer: Medicare Other

## 2022-11-22 VITALS — Ht 74.0 in | Wt 235.0 lb

## 2022-11-22 DIAGNOSIS — Z0000 Encounter for general adult medical examination without abnormal findings: Secondary | ICD-10-CM

## 2022-11-22 DIAGNOSIS — R972 Elevated prostate specific antigen [PSA]: Secondary | ICD-10-CM | POA: Diagnosis not present

## 2022-11-22 NOTE — Patient Instructions (Signed)
Travis Padilla , Thank you for taking time to come for your Medicare Wellness Visit. I appreciate your ongoing commitment to your health goals. Please review the following plan we discussed and let me know if I can assist you in the future.   These are the goals we discussed:  Goals      DIET - EAT MORE FRUITS AND VEGETABLES     Exercise 3x per week (30 min per time)     Continue to exercise regularly at least 3 times per week     LDL CALC < 70     Weight (lb) < 230 lb (104.3 kg)        This is a list of the screening recommended for you and due dates:  Health Maintenance  Topic Date Due   COVID-19 Vaccine (4 - 2023-24 season) 05/11/2022   Hepatitis C Screening: USPSTF Recommendation to screen - Ages 18-79 yo.  04/18/2023*   Medicare Annual Wellness Visit  11/22/2023   Colon Cancer Screening  11/03/2024   DTaP/Tdap/Td vaccine (2 - Tdap) 02/22/2025   Pneumonia Vaccine  Completed   Flu Shot  Completed   Zoster (Shingles) Vaccine  Completed   HPV Vaccine  Aged Out  *Topic was postponed. The date shown is not the original due date.    Advanced directives: In Chart  Conditions/risks identified: Aim for 30 minutes of exercise or brisk walking, 6-8 glasses of water, and 5 servings of fruits and vegetables each day.   Next appointment: Follow up in one year for your annual wellness visit.   Preventive Care 24 Years and Older, Male  Preventive care refers to lifestyle choices and visits with your health care provider that can promote health and wellness. What does preventive care include? A yearly physical exam. This is also called an annual well check. Dental exams once or twice a year. Routine eye exams. Ask your health care provider how often you should have your eyes checked. Personal lifestyle choices, including: Daily care of your teeth and gums. Regular physical activity. Eating a healthy diet. Avoiding tobacco and drug use. Limiting alcohol use. Practicing safe sex. Taking  low doses of aspirin every day. Taking vitamin and mineral supplements as recommended by your health care provider. What happens during an annual well check? The services and screenings done by your health care provider during your annual well check will depend on your age, overall health, lifestyle risk factors, and family history of disease. Counseling  Your health care provider may ask you questions about your: Alcohol use. Tobacco use. Drug use. Emotional well-being. Home and relationship well-being. Sexual activity. Eating habits. History of falls. Memory and ability to understand (cognition). Work and work Statistician. Screening  You may have the following tests or measurements: Height, weight, and BMI. Blood pressure. Lipid and cholesterol levels. These may be checked every 5 years, or more frequently if you are over 56 years old. Skin check. Lung cancer screening. You may have this screening every year starting at age 59 if you have a 30-pack-year history of smoking and currently smoke or have quit within the past 15 years. Fecal occult blood test (FOBT) of the stool. You may have this test every year starting at age 47. Flexible sigmoidoscopy or colonoscopy. You may have a sigmoidoscopy every 5 years or a colonoscopy every 10 years starting at age 4. Prostate cancer screening. Recommendations will vary depending on your family history and other risks. Hepatitis C blood test. Hepatitis B blood test.  Sexually transmitted disease (STD) testing. Diabetes screening. This is done by checking your blood sugar (glucose) after you have not eaten for a while (fasting). You may have this done every 1-3 years. Abdominal aortic aneurysm (AAA) screening. You may need this if you are a current or former smoker. Osteoporosis. You may be screened starting at age 77 if you are at high risk. Talk with your health care provider about your test results, treatment options, and if necessary, the  need for more tests. Vaccines  Your health care provider may recommend certain vaccines, such as: Influenza vaccine. This is recommended every year. Tetanus, diphtheria, and acellular pertussis (Tdap, Td) vaccine. You may need a Td booster every 10 years. Zoster vaccine. You may need this after age 94. Pneumococcal 13-valent conjugate (PCV13) vaccine. One dose is recommended after age 33. Pneumococcal polysaccharide (PPSV23) vaccine. One dose is recommended after age 55. Talk to your health care provider about which screenings and vaccines you need and how often you need them. This information is not intended to replace advice given to you by your health care provider. Make sure you discuss any questions you have with your health care provider. Document Released: 09/23/2015 Document Revised: 05/16/2016 Document Reviewed: 06/28/2015 Elsevier Interactive Patient Education  2017 [employer] Prevention in the Home Falls can cause injuries. They can happen to people of all ages. There are many things you can do to make your home safe and to help prevent falls. What can I do on the outside of my home? Regularly fix the edges of walkways and driveways and fix any cracks. Remove anything that might make you trip as you walk through a door, such as a raised step or threshold. Trim any bushes or trees on the path to your home. Use bright outdoor lighting. Clear any walking paths of anything that might make someone trip, such as rocks or tools. Regularly check to see if handrails are loose or broken. Make sure that both sides of any steps have handrails. Any raised decks and porches should have guardrails on the edges. Have any leaves, snow, or ice cleared regularly. Use sand or salt on walking paths during winter. Clean up any spills in your garage right away. This includes oil or grease spills. What can I do in the bathroom? Use night lights. Install grab bars by the toilet and in the tub  and shower. Do not use towel bars as grab bars. Use non-skid mats or decals in the tub or shower. If you need to sit down in the shower, use a plastic, non-slip stool. Keep the floor dry. Clean up any water that spills on the floor as soon as it happens. Remove soap buildup in the tub or shower regularly. Attach bath mats securely with double-sided non-slip rug tape. Do not have throw rugs and other things on the floor that can make you trip. What can I do in the bedroom? Use night lights. Make sure that you have a light by your bed that is easy to reach. Do not use any sheets or blankets that are too big for your bed. They should not hang down onto the floor. Have a firm chair that has side arms. You can use this for support while you get dressed. Do not have throw rugs and other things on the floor that can make you trip. What can I do in the kitchen? Clean up any spills right away. Avoid walking on wet floors. Keep items that you  use a lot in easy-to-reach places. If you need to reach something above you, use a strong step stool that has a grab bar. Keep electrical cords out of the way. Do not use floor polish or wax that makes floors slippery. If you must use wax, use non-skid floor wax. Do not have throw rugs and other things on the floor that can make you trip. What can I do with my stairs? Do not leave any items on the stairs. Make sure that there are handrails on both sides of the stairs and use them. Fix handrails that are broken or loose. Make sure that handrails are as long as the stairways. Check any carpeting to make sure that it is firmly attached to the stairs. Fix any carpet that is loose or worn. Avoid having throw rugs at the top or bottom of the stairs. If you do have throw rugs, attach them to the floor with carpet tape. Make sure that you have a light switch at the top of the stairs and the bottom of the stairs. If you do not have them, ask someone to add them for  you. What else can I do to help prevent falls? Wear shoes that: Do not have high heels. Have rubber bottoms. Are comfortable and fit you well. Are closed at the toe. Do not wear sandals. If you use a stepladder: Make sure that it is fully opened. Do not climb a closed stepladder. Make sure that both sides of the stepladder are locked into place. Ask someone to hold it for you, if possible. Clearly mark and make sure that you can see: Any grab bars or handrails. First and last steps. Where the edge of each step is. Use tools that help you move around (mobility aids) if they are needed. These include: Canes. Walkers. Scooters. Crutches. Turn on the lights when you go into a dark area. Replace any light bulbs as soon as they burn out. Set up your furniture so you have a clear path. Avoid moving your furniture around. If any of your floors are uneven, fix them. If there are any pets around you, be aware of where they are. Review your medicines with your doctor. Some medicines can make you feel dizzy. This can increase your chance of falling. Ask your doctor what other things that you can do to help prevent falls. This information is not intended to replace advice given to you by your health care provider. Make sure you discuss any questions you have with your health care provider. Document Released: 06/23/2009 Document Revised: 02/02/2016 Document Reviewed: 10/01/2014 Elsevier Interactive Patient Education  2017 [employer].

## 2022-11-22 NOTE — Progress Notes (Signed)
Subjective:   Travis Padilla is a 77 y.o. male who presents for Medicare Annual/Subsequent preventive examination. I connected with  Travis Padilla on 11/22/22 by a audio enabled telemedicine application and verified that I am speaking with the correct person using two identifiers.  Patient Location: Home  Provider Location: Home Office  I discussed the limitations of evaluation and management by telemedicine. The patient expressed understanding and agreed to proceed.  Review of Systems     Cardiac Risk Factors include: advanced age (>53mn, >>55women);male gender;dyslipidemia;hypertension     Objective:    Today's Vitals   11/22/22 1419  Weight: 235 lb (106.6 kg)  Height: '6\' 2"'$  (1.88 m)   Body mass index is 30.17 kg/m.     11/22/2022    2:23 PM 03/20/2022    9:40 AM 12/26/2021    1:46 PM 12/18/2021    3:17 PM 10/04/2021   10:42 AM 06/11/2021    6:57 PM 05/25/2021    7:58 AM  Advanced Directives  Does Patient Have a Medical Advance Directive? Yes Yes No  Yes Yes Yes  Type of AParamedicof AHurstbourne AcresLiving will [place]ower LakesLiving will  HFirthLiving will HDalmatiaLiving will Living will Living will  Does patient want to make changes to medical advance directive? No - Patient declined No - Patient declined No - Patient declined   No - Patient declined No - Patient declined  Copy of [place] Chart? Yes - validated most recent copy scanned in chart (See row information) No - copy requested   Yes - validated most recent copy scanned in chart (See row information)    Would patient like information on creating a medical advance directive?   No - Patient declined        Current Medications (verified) Outpatient Encounter Medications as of 11/22/2022  Medication Sig   acetaminophen (TYLENOL) 500 MG tablet Take 1,000 mg by mouth every 6 (six) hours as needed.   albuterol (VENTOLIN  HFA) 108 (90 Base) MCG/ACT inhaler Inhale 2 puffs into the lungs every 6 (six) hours as needed for wheezing or shortness of breath.   ALPRAZolam (XANAX) 0.25 MG tablet Take 1 tablet (0.25 mg total) by mouth at bedtime as needed for anxiety.   Artificial Tear Solution (SOOTHE XP OP) Place 1 drop into both eyes 2 (two) times daily.   carvedilol (COREG) 25 MG tablet Take 1 tablet (25 mg total) by mouth 2 (two) times daily with a meal.   clopidogrel (PLAVIX) 75 MG tablet TAKE 1 TABLET BY MOUTH EVERY DAY   [employer] IRON 325 (65 Fe) MG tablet TAKE 1 TABLET BY MOUTH DAILY   fluticasone (FLONASE) 50 MCG/ACT nasal spray Place 1 spray into both nostrils daily. (Patient taking differently: Place 1 spray into both nostrils daily as needed for allergies.)   levothyroxine (SYNTHROID) 175 MCG tablet TAKE 1 TABLET BY MOUTH EVERY DAY   methocarbamol (ROBAXIN) 500 MG tablet Take 500 mg by mouth every 6 (six) hours as needed for muscle spasms.   nitroGLYCERIN (NITROSTAT) 0.4 MG SL tablet Place 1 tablet (0.4 mg total) under the tongue every 5 (five) minutes as needed for chest pain.   pantoprazole (PROTONIX) 40 MG tablet TAKE 1 TABLET BY MOUTH DAILY BEFORE BREAKFAST   polyethylene glycol powder (GLYCOLAX/MIRALAX) 17 GM/SCOOP powder Take 1 Container by mouth daily.   PRALUENT 150 MG/ML SOAJ INJECT 150 MG INTO THE SKIN EVERY  14 (FOURTEEN) DAYS.   predniSONE (STERAPRED UNI-PAK 21 TAB) 10 MG (21) TBPK tablet Use as directed   senna-docusate (SENNA S) 8.6-50 MG tablet 1 to 2 twice daily for constipation   tamsulosin (FLOMAX) 0.4 MG CAPS capsule Take 0.4 mg by mouth at bedtime.   diphenhydrAMINE (BENADRYL) 50 MG tablet Take 1 tablet (50 mg total) by mouth once for 1 dose. Pt to take 50 mg of benadryl on 10/20/21 at 12:00 PM (noon). Please call [phone] with any questions.   No facility-administered encounter medications on file as of 11/22/2022.    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) ([place]) dx'd 04/17/18   to LN, infrahilar mass, lung nodule and lt axilla   Obesity, unspecified 03/28/2009   Qualifier: Diagnosis of  By: [name], MD, [name]     Palpitations 03/28/2009   Qualifier: Diagnosis of  By: [name], MD, [name]     Right lower lobe lung mass 04/22/2018   Skin cancer    Sleep apnea    SNORING 03/28/2009   Qualifier: Diagnosis of  By: [name], MD, [name]     Spinal stenosis    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   Past Surgical History:  Procedure Laterality Date   basal skin cancer N/A 2019   Nose   BELPHAROPTOSIS REPAIR Bilateral    CARDIAC CATHETERIZATION     CATARACT EXTRACTION W/ INTRAOCULAR LENS  IMPLANT, BILATERAL     COLONOSCOPY N/A 11/03/2014   Procedure: COLONOSCOPY;  Surgeon: Rogene Houston, MD;  [facility];  Service: Endoscopy;  Laterality: N/A;  1225   CORONARY ATHERECTOMY N/A 05/26/2021   Procedure: CORONARY ATHERECTOMY;  Surgeon: Early Osmond, MD;  Location: [place] [facility];  Service: Cardiovascular;  Laterality: N/A;   CORONARY STENT INTERVENTION N/A 05/25/2021   Procedure: CORONARY STENT INTERVENTION;  Surgeon: Nelva Bush, MD;  Location: [place] [facility];  Service: Cardiovascular;  Laterality: N/A;   CORONARY STENT INTERVENTION N/A 05/26/2021   Procedure: CORONARY STENT INTERVENTION;  Surgeon: Early Osmond, MD;   Location: [place] [facility];  Service: Cardiovascular;  Laterality: N/A;   ESOPHAGOGASTRODUODENOSCOPY (EGD) WITH PROPOFOL N/A 05/29/2021   Procedure: ESOPHAGOGASTRODUODENOSCOPY (EGD) WITH PROPOFOL;  Surgeon: Gatha Mayer, MD;  Location: [place];  Service: Endoscopy;  Laterality: N/A;   INTRAVASCULAR ULTRASOUND/IVUS N/A 05/26/2021   Procedure: Intravascular Ultrasound/IVUS;  Surgeon: Early Osmond, MD;  Location: [place] [facility];  Service: Cardiovascular;  Laterality: N/A;   IR CV LINE INJECTION  08/24/2020   IR FLUORO GUIDED NEEDLE PLC ASPIRATION/INJECTION LOC  11/28/2020   IR IMAGING GUIDED PORT INSERTION  07/11/2018   KNEE CARTILAGE SURGERY Left    Left knee   LEFT HEART CATH AND CORONARY ANGIOGRAPHY N/A 05/25/2021   Procedure: LEFT HEART CATH AND CORONARY  ANGIOGRAPHY;  Surgeon: Nelva Bush, MD;  Location: [place] [facility];  Service: Cardiovascular;  Laterality: N/A;   SKIN CANCER EXCISION  12/2014, 04/25/15   TEMPORARY PACEMAKER N/A 05/26/2021   Procedure: TEMPORARY PACEMAKER;  Surgeon: Early Osmond, MD;  Location: [place] [facility];  Service: Cardiovascular;  Laterality: N/A;   VIDEO BRONCHOSCOPY Bilateral 05/09/2018   Procedure: VIDEO BRONCHOSCOPY WITH FLUORO;  Surgeon: Juanito Doom, MD;  [facility];  Service: Cardiopulmonary;  Laterality: Bilateral;   Family History  Problem Relation Age of Onset   Heart attack Father    Heart failure Mother    Parkinson's disease Mother    Healthy Sister    Skin cancer Sister    Neuropathy Brother    COPD Brother    Epilepsy Brother    Healthy Sister    Healthy Sister    Colon cancer Neg Hx    Social History   Socioeconomic History   Marital status: Widowed    Spouse name: Not on file   Number of children: 1   Years of education: Not on file   Highest education level: Not on file  Occupational History   Occupation: Government social research officer  Tobacco Use   Smoking status: Former    Packs/day: 0.20     Years: 2.00    Additional pack years: 0.00    Total pack years: 0.40    Types: Cigarettes    Start date: 10/01/1968    Quit date: 94    Years since quitting: 52.2   Smokeless tobacco: Never  Vaping Use   Vaping Use: Never used  Substance and Sexual Activity   Alcohol use: No    Comment: hx of    Drug use: No   Sexual activity: Not Currently  Other Topics Concern   Not on file  Social History Narrative   Unable to ask intimate partner violence questions, wife present.    Pt's wife died in Sep 01, 2021.   Social Determinants of Health   Financial Resource Strain: Low Risk  (11/22/2022)   Overall Financial Resource Strain (CARDIA)    Difficulty of Paying Living Expenses: Not hard at all  Food Insecurity: No Food Insecurity (11/22/2022)   Hunger Vital Sign    Worried About Running Out of Food in the Last Year: Never true    Ran Out of Food in the Last Year: Never true  Transportation Needs: No Transportation Needs (11/22/2022)   PRAPARE - Hydrologist (Medical): No    Lack of Transportation (Non-Medical): No  Physical Activity: Sufficiently Active (11/22/2022)   Exercise Vital Sign    Days of Exercise per Week: 4 days    Minutes of Exercise per Session: 60 min  Stress: No Stress Concern Present (11/22/2022)   [place]    Feeling of Stress : Not at all  Social Connections: Moderately Isolated (11/22/2022)   Social Connection and Isolation Panel [NHANES]    Frequency of Communication with Friends and Family: More than three times a week    Frequency of Social Gatherings with Friends and Family: More than three times a week    Attends Religious Services: Never    Marine scientist or Organizations: Yes    Attends Music therapist: More than 4 times per year    Marital Status: Widowed    Tobacco Counseling Counseling given: Not Answered   Clinical  Intake:  Pre-visit preparation  completed: Yes  Pain : No/denies pain     Nutritional Risks: None Diabetes: No  How often do you need to have someone help you when you read instructions, pamphlets, or other written materials from your doctor or pharmacy?: 1 - Never  Diabetic?no   Interpreter Needed?: No  Information entered by :: Jadene Pierini, LPN   Activities of Daily Living    11/22/2022    2:23 PM  In your present state of health, do you have any difficulty performing the following activities:  Hearing? 0  Vision? 0  Difficulty concentrating or making decisions? 0  Walking or climbing stairs? 0  Dressing or bathing? 0  Doing errands, shopping? 0  Preparing Food and eating ? N  Using the Toilet? N  In the past six months, have you accidently leaked urine? N  Do you have problems with loss of bowel control? N  Managing your Medications? N  Managing your Finances? N  Housekeeping or managing your Housekeeping? N    Patient Care Team: [name], DO as PCP - General (Family Medicine) [name], MD as PCP - Cardiology (Cardiology) Griselda Miner, MD as Consulting Physician (Dermatology) Netta Cedars, MD as Consulting Physician (Orthopedic Surgery) Gwendalyn Ege, OD as Referring Physician (Ophthalmology) Rogene Houston, MD as Consulting Physician (Gastroenterology) [name], MD as Consulting Physician (Cardiology)  Indicate any recent Medical Services you may have received from other than [facility] providers in the past year (date may be approximate).     Assessment:   This is a routine wellness examination for L[place].  Hearing/Vision screen Vision Screening - Comments:: Wears rx glasses - up to date with routine eye exams with  Dr.Omen   Dietary issues and exercise activities discussed: Current Exercise Habits: Home exercise routine, Type of exercise: walking, Time (Minutes): 60, Frequency (Times/Week): 4, Weekly Exercise (Minutes/Week):  240, Intensity: Mild, Exercise limited by: None identified   Goals Addressed             This Visit's Progress    DIET - EAT MORE FRUITS AND VEGETABLES   On track      Depression Screen    11/22/2022    2:22 PM 09/11/2022    2:14 PM 04/18/2022   11:05 AM 12/06/2021   10:55 AM 11/08/2021   10:00 AM 11/01/2021    8:22 AM 10/04/2021   10:30 AM  PHQ 2/9 Scores  PHQ - 2 Score 0 0 '1 2 2 2 4  '$ PHQ- 9 Score  '3 2 7 9 6 6    '$ Fall Risk    11/22/2022    2:20 PM 09/11/2022    2:12 PM 04/18/2022   11:05 AM 11/01/2021    8:22 AM 10/04/2021   10:42 AM  Fall Risk   Falls in the past year? 0 0 0 0 0  Number falls in past yr: 0    0  Injury with Fall? 0    0  Risk for fall due to : No Fall Risks    No Fall Risks  Follow up Falls prevention discussed    Falls prevention discussed    FALL RISK PREVENTION PERTAINING TO THE HOME:  Any stairs in or around the home? Yes  If so, are there any without handrails? No  Home free of loose throw rugs in walkways, pet beds, electrical cords, etc? Yes  Adequate lighting in your home to reduce risk of falls? Yes   ASSISTIVE DEVICES UTILIZED TO PREVENT FALLS:  Life  alert? No  Use of a cane, walker or w/c? No  Grab bars in the bathroom? No  Shower chair or bench in shower? No  Elevated toilet seat or a handicapped toilet? No       04/08/2018   10:35 AM 10/04/2016   10:11 AM  MMSE - Mini Mental State Exam  Orientation to time 5 5  Orientation to Place 5 5  Registration 3 3  Attention/ Calculation 5 5  Recall 3 2  Language- name 2 objects 2 2  Language- repeat 1 1  Language- follow 3 step command 3 3  Language- read & follow direction 1 1  Write a sentence 1 1  Copy design 1 1  Total score 30 29        11/22/2022    2:24 PM 10/04/2021   10:46 AM  6CIT Screen  What Year? 0 points 0 points  What month? 0 points 0 points  What time? 0 points 0 points  Count back from 20 0 points 0 points  Months in reverse 0 points 2 points  Repeat phrase 0  points 0 points  Total Score 0 points 2 points    Immunizations Immunization History  Administered Date(s) Administered   Influenza Nasal 06/29/2016   Influenza Split 05/14/2012   Influenza, High Dose Seasonal PF 08/03/2014, 05/29/2015, 07/22/2017, 06/11/2018, 05/05/2019, 06/28/2020, 05/16/2021, 04/24/2022   Influenza-Unspecified 06/29/2016   PFIZER Comirnaty(Gray Top)Covid-19 Tri-Sucrose Vaccine 02/14/2021   PFIZER(Purple Top)SARS-COV-2 Vaccination 10/15/2019, 06/13/2020   PNEUMOCOCCAL CONJUGATE-20 04/18/2022   Pneumococcal Conjugate-13 03/30/2015   Pneumococcal Polysaccharide-23 07/21/2012   Td 02/23/2015   Zoster Recombinat (Shingrix) 11/01/2021, 04/18/2022   Zoster, Live 07/21/2012    TDAP status: Up to date  Flu Vaccine status: Up to date  Pneumococcal vaccine status: Up to date  Covid-19 vaccine status: Completed vaccines  Qualifies for Shingles Vaccine? Yes   Zostavax completed Yes   Shingrix Completed?: Yes  Screening Tests Health Maintenance  Topic Date Due   COVID-19 Vaccine (4 - 2023-24 season) 05/11/2022   Hepatitis C Screening  04/18/2023 (Originally 07/04/1964)   Medicare Annual Wellness (AWV)  11/22/2023   COLONOSCOPY (Pts 45-41yr Insurance coverage will need to be confirmed)  11/03/2024   DTaP/Tdap/Td (2 - Tdap) 02/22/2025   Pneumonia Vaccine 77 Years old  Completed   INFLUENZA VACCINE  Completed   Zoster Vaccines- Shingrix  Completed   HPV VACCINES  Aged Out    Health Maintenance  Health Maintenance Due  Topic Date Due   COVID-19 Vaccine (4 - 2023-24 season) 05/11/2022    Colorectal cancer screening: Type of screening: Colonoscopy. Completed 11/03/2014. Repeat every 10 years  Lung Cancer Screening: (Low Dose CT Chest recommended if Age 76-80years, 30 pack-year currently smoking OR have quit w/in 15years.) does not qualify.   Lung Cancer Screening Referral: Dx: Lung Cancer Ct with Oncology 08/21/2022  Additional Screening:  Hepatitis C  Screening: does not qualify;   Vision Screening: Recommended annual ophthalmology exams for early detection of glaucoma and other disorders of the eye. Is the patient up to date with their annual eye exam?  Yes  Who is the provider or what is the name of the office in which the patient attends annual eye exams? Dr.Omen  If pt is not established with a provider, would they like to be referred to a provider to establish care? No .   Dental Screening: Recommended annual dental exams for proper oral hygiene  Community Resource Referral / Chronic Care Management: CRR  required this visit?  No   CCM required this visit?  No      Plan:     I have personally reviewed and noted the following in the patient's chart:   Medical and social history Use of alcohol, tobacco or illicit drugs  Current medications and supplements including opioid prescriptions. Patient is not currently taking opioid prescriptions. Functional ability and status Nutritional status Physical activity Advanced directives List of other physicians Hospitalizations, surgeries, and ER visits in previous 12 months Vitals Screenings to include cognitive, depression, and falls Referrals and appointments  In addition, I have reviewed and discussed with patient certain preventive protocols, quality metrics, and best practice recommendations. A written personalized care plan for preventive services as well as general preventive health recommendations were provided to patient.     Daphane Shepherd, LPN   [id number]   Nurse Notes: Will discuss colonoscopy with Pcp

## 2022-11-29 DIAGNOSIS — R350 Frequency of micturition: Secondary | ICD-10-CM | POA: Diagnosis not present

## 2022-11-29 DIAGNOSIS — R972 Elevated prostate specific antigen [PSA]: Secondary | ICD-10-CM | POA: Diagnosis not present

## 2022-11-29 DIAGNOSIS — N401 Enlarged prostate with lower urinary tract symptoms: Secondary | ICD-10-CM | POA: Diagnosis not present

## 2022-11-29 DIAGNOSIS — R3912 Poor urinary stream: Secondary | ICD-10-CM | POA: Diagnosis not present

## 2022-12-05 DIAGNOSIS — L578 Other skin changes due to chronic exposure to nonionizing radiation: Secondary | ICD-10-CM | POA: Diagnosis not present

## 2022-12-05 DIAGNOSIS — L821 Other seborrheic keratosis: Secondary | ICD-10-CM | POA: Diagnosis not present

## 2022-12-05 DIAGNOSIS — L719 Rosacea, unspecified: Secondary | ICD-10-CM | POA: Diagnosis not present

## 2022-12-05 DIAGNOSIS — L570 Actinic keratosis: Secondary | ICD-10-CM | POA: Diagnosis not present

## 2022-12-21 ENCOUNTER — Telehealth: Payer: Self-pay | Admitting: Internal Medicine

## 2022-12-24 ENCOUNTER — Other Ambulatory Visit: Payer: Self-pay

## 2022-12-24 ENCOUNTER — Inpatient Hospital Stay: Payer: Medicare Other

## 2022-12-24 ENCOUNTER — Inpatient Hospital Stay: Payer: Medicare Other | Attending: Internal Medicine

## 2022-12-24 ENCOUNTER — Encounter: Payer: Self-pay | Admitting: Internal Medicine

## 2022-12-24 DIAGNOSIS — Z95828 Presence of other vascular implants and grafts: Secondary | ICD-10-CM

## 2022-12-24 DIAGNOSIS — H43813 Vitreous degeneration, bilateral: Secondary | ICD-10-CM | POA: Diagnosis not present

## 2022-12-24 DIAGNOSIS — C3412 Malignant neoplasm of upper lobe, left bronchus or lung: Secondary | ICD-10-CM | POA: Insufficient documentation

## 2022-12-24 DIAGNOSIS — C7801 Secondary malignant neoplasm of right lung: Secondary | ICD-10-CM

## 2022-12-24 MED ORDER — HEPARIN SOD (PORK) LOCK FLUSH 100 UNIT/ML IV SOLN
500.0000 [IU] | Freq: Once | INTRAVENOUS | Status: AC
  Administered 2022-12-24: 500 [IU]

## 2022-12-24 MED ORDER — SODIUM CHLORIDE 0.9% FLUSH
10.0000 mL | Freq: Once | INTRAVENOUS | Status: AC
  Administered 2022-12-24: 10 mL

## 2023-01-09 DIAGNOSIS — C61 Malignant neoplasm of prostate: Secondary | ICD-10-CM

## 2023-01-09 HISTORY — DX: Malignant neoplasm of prostate: C61

## 2023-01-11 ENCOUNTER — Telehealth: Payer: Self-pay | Admitting: Cardiology

## 2023-01-11 ENCOUNTER — Telehealth: Payer: Self-pay | Admitting: *Deleted

## 2023-01-11 NOTE — Telephone Encounter (Signed)
   Pre-operative Risk Assessment    Patient Name: Travis Padilla  DOB: 1946/03/18 MRN: [id number]      Request for Surgical Clearance    Procedure:   Prostate biopsy   Date of Surgery:  Clearance 01/15/23                                 Surgeon: Dr. Glenford Peers Group or Practice Name:  [facility]  Phone number:  [phone]  Fax number:  [phone]   Type of Clearance Requested:   - Medical  - Pharmacy:  Hold Clopidogrel (Plavix) 5 days prior       Type of Anesthesia:  None    Additional requests/questions:    TRENADA STATED THAT PT HAD ALREADY STOPPED PLAVIX BUT WAS UNSURE HOW LONG  I CALLED THE PT HE STATED HE HAS BEEN OFF OF IT FOR 3 DAYS NOW Signed, Forest Gleason   01/11/2023, 4:29 PM

## 2023-01-11 NOTE — Telephone Encounter (Signed)
   Name: Travis Padilla  DOB: [birth year]  MRN: [id number]  Primary Cardiologist: [name], MD   Preoperative team, please contact this patient and set up a phone call appointment for further preoperative risk assessment. Please obtain consent and complete medication review. Thank you for your help.  I confirm that guidance regarding antiplatelet and oral anticoagulation therapy has been completed and, if necessary, noted below.  His Plavix may be held for 5 days prior to his procedure.  Please resume Plavix as soon as hemostasis is achieved.  81 mg aspirin should be taken throughout the perioperative period.   Ronney Asters, NP 01/11/2023, 4:46 PM [facility]

## 2023-01-11 NOTE — Telephone Encounter (Signed)
I s/w the pt about needing a tele appt for pre op clearance. I informed the pt that we just got the clearance request @ 4:30 today for a procedure on Tues 01/15/23, along with needing to hold Plavix. I did say that I read notes that he has already stopped Plavix per the instructions from surgeon's office. I asked pt when was his last dose of Plavix. Pt answered he took Plavix on Wed 01/09/23 and has not had any since. Pt has been added to pre op schedule 01/14/23 @ 1:40. Med rec and consent are done. Pt thanked me for the help. I assured the pt that I will update surgeon's office as FYI pt needed a tele visit before being cleared.      Patient Consent for Virtual Visit        Travis Padilla has provided verbal consent on 01/11/2023 for a virtual visit (video or telephone).   CONSENT FOR VIRTUAL VISIT FOR:  Travis Padilla  By participating in this virtual visit I agree to the following:  I hereby voluntarily request, consent and authorize [facility] and its employed or contracted physicians, physician assistants, nurse practitioners or other licensed health care professionals (the Practitioner), to provide me with telemedicine health care services (the "Services") as deemed necessary by the treating Practitioner. I acknowledge and consent to receive the Services by the Practitioner via telemedicine. I understand that the telemedicine visit will involve communicating with the Practitioner through live audiovisual communication technology and the disclosure of certain medical information by electronic transmission. I acknowledge that I have been given the opportunity to request an in-person assessment or other available alternative prior to the telemedicine visit and am voluntarily participating in the telemedicine visit.  I understand that I have the right to withhold or withdraw my consent to the use of telemedicine in the course of my care at any time, without affecting my right to future care or  treatment, and that the Practitioner or I may terminate the telemedicine visit at any time. I understand that I have the right to inspect all information obtained and/or recorded in the course of the telemedicine visit and may receive copies of available information for a reasonable fee.  I understand that some of the potential risks of receiving the Services via telemedicine include:  Delay or interruption in medical evaluation due to technological equipment failure or disruption; Information transmitted may not be sufficient (e.g. poor resolution of images) to allow for appropriate medical decision making by the Practitioner; and/or  In rare instances, security protocols could fail, causing a breach of personal health information.  Furthermore, I acknowledge that it is my responsibility to provide information about my medical history, conditions and care that is complete and accurate to the best of my ability. I acknowledge that Practitioner's advice, recommendations, and/or decision may be based on factors not within their control, such as incomplete or inaccurate data provided by me or distortions of diagnostic images or specimens that may result from electronic transmissions. I understand that the practice of medicine is not an exact science and that Practitioner makes no warranties or guarantees regarding treatment outcomes. I acknowledge that a copy of this consent can be made available to me via my patient portal [facility] MyChart), or I can request a printed copy by calling the office of [facility].    I understand that my insurance will be billed for this visit.   I have read or had this consent  read to me. I understand the contents of this consent, which adequately explains the benefits and risks of the Services being provided via telemedicine.  I have been provided ample opportunity to ask questions regarding this consent and the Services and have had my questions answered to my  satisfaction. I give my informed consent for the services to be provided through the use of telemedicine in my medical care

## 2023-01-11 NOTE — Telephone Encounter (Signed)
I s/w the pt about needing a tele appt for pre op clearance. I informed the pt that we just got the clearance request @ 4:30 today for a procedure on Tues 01/15/23, along with needing to hold Plavix. I did say that I read notes that he has already stopped Plavix per the instructions from surgeon's office. I asked pt when was his last dose of Plavix. Pt answered he took Plavix on Wed 01/09/23 and has not had any since. Pt has been added to pre op schedule 01/14/23 @ 1:40. Med rec and consent are done. Pt thanked me for the help. I assured the pt that I will update surgeon's office as FYI pt needed a tele visit before being cleared.

## 2023-01-14 ENCOUNTER — Ambulatory Visit: Payer: Medicare Other | Attending: Cardiology | Admitting: Student

## 2023-01-14 DIAGNOSIS — Z01810 Encounter for preprocedural cardiovascular examination: Secondary | ICD-10-CM

## 2023-01-14 NOTE — Progress Notes (Signed)
Virtual Visit via Telephone Note   Because of Travis Padilla's co-morbid illnesses, he is at least at moderate risk for complications without adequate follow up.  This format is felt to be most appropriate for this patient at this time.  The patient did not have access to video technology/had technical difficulties with video requiring transitioning to audio format only (telephone).  All issues noted in this document were discussed and addressed.  No physical exam could be performed with this format.  Please refer to the patient's chart for his consent to telehealth for [facility].  Evaluation Performed:  Preoperative cardiovascular risk assessment _____________   Date:  01/14/2023   Patient ID:  Travis Padilla, Travis Padilla 1946-07-22, MRN [id number] Patient Location:  Home Provider location:   Office  Primary Care Provider:  [name], DO Primary Cardiologist:  [name], MD  Chief Complaint / Patient Profile   77 y.o. y/o male with a h/o CAD s/p PCI with DES to LAD and atherectomy and DES to proximal to mid RCA September 2022, ascending aortic dilatation, GI bleed, hypertension, hyperlipidemia, hypothyroidism, lung CA, COPD, OSA who is pending prostate biopsy by [name] on 01/15/2023 and presents today for telephonic preoperative cardiovascular risk assessment.  History of Present Illness    [name] is a 77 y.o. male who presents via audio/video conferencing for a telehealth visit today.  Pt was last seen in cardiology clinic on 05/23/2022 by Dr. [name].  At that time Travis Padilla was doing well.  The patient is now pending procedure as outlined above. Since his last visit, he is doing well from a cardiac standpoint. Patient denies shortness of breath or dyspnea on exertion. No chest pain, pressure, or tightness. Denies lower extremity edema, orthopnea, or PND. No palpitations. Stays active doing cardio and strength workouts as well as a lot of walking.   Past  Medical History    Past Medical History:  Diagnosis Date   Abnormal nuclear stress test    December, 2013   Anemia    Axillary adenopathy 04/24/2018   CAD (coronary artery disease)    Mild nonobstructive plaque in cath 2013   Chest pain    December, 2013   Cough with hemoptysis 04/22/2018   Dizziness    Dyslipidemia 07/14/2019   Encounter for antineoplastic immunotherapy 05/13/2018   Gout    Hemorrhoid    Hyperlipidemia    Hypertension    Hypothyroidism (acquired) 08/11/2018   Metastasis to lung (HCC) 05/19/2018   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) (HCC) dx'd 04/17/18   to LN, infrahilar mass, lung nodule and lt axilla   Obesity, unspecified 03/28/2009   Qualifier: Diagnosis of  By: [name], MD, [name]     Palpitations 03/28/2009   Qualifier: Diagnosis of  By: [name], MD, [name]     Right lower lobe lung mass 04/22/2018   Skin cancer    Sleep apnea    SNORING 03/28/2009   Qualifier: Diagnosis of  By: [name], MD, [name]     Spinal stenosis    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   Past Surgical History:  Procedure Laterality Date   basal skin cancer N/A 2019   Nose   BELPHAROPTOSIS REPAIR Bilateral    CARDIAC CATHETERIZATION     CATARACT EXTRACTION W/ INTRAOCULAR LENS  IMPLANT, BILATERAL     COLONOSCOPY N/A 11/03/2014   Procedure: COLONOSCOPY;  Surgeon: [name]  [name], MD;  [facility];  Service: Endoscopy;  Laterality: N/A;  1225   CORONARY ATHERECTOMY N/A 05/26/2021   Procedure: CORONARY ATHERECTOMY;  Surgeon: [name], MD;  [facility];  Service: Cardiovascular;  Laterality: N/A;   CORONARY STENT INTERVENTION N/A 05/25/2021   Procedure: CORONARY STENT INTERVENTION;  Surgeon: [name], MD;  [facility];  Service: Cardiovascular;  Laterality: N/A;   CORONARY STENT INTERVENTION N/A 05/26/2021   Procedure: CORONARY STENT INTERVENTION;   Surgeon: [name], MD;  [facility];  Service: Cardiovascular;  Laterality: N/A;   CORONARY ULTRASOUND/IVUS N/A 05/26/2021   Procedure: Intravascular Ultrasound/IVUS;  Surgeon: [name], MD;  [facility];  Service: Cardiovascular;  Laterality: N/A;   ESOPHAGOGASTRODUODENOSCOPY (EGD) WITH PROPOFOL N/A 05/29/2021   Procedure: ESOPHAGOGASTRODUODENOSCOPY (EGD) WITH PROPOFOL;  Surgeon: [name], MD;  Location: [facility] ENDOSCOPY;  Service: Endoscopy;  Laterality: N/A;   IR CV LINE INJECTION  08/24/2020   IR FLUORO GUIDED NEEDLE PLC ASPIRATION/INJECTION LOC  11/28/2020   IR IMAGING GUIDED PORT INSERTION  07/11/2018   KNEE CARTILAGE SURGERY Left    Left knee   LEFT HEART CATH AND CORONARY ANGIOGRAPHY N/A 05/25/2021   Procedure: LEFT HEART CATH AND CORONARY ANGIOGRAPHY;  Surgeon: [name], MD;  [facility];  Service: Cardiovascular;  Laterality: N/A;   SKIN CANCER EXCISION  12/2014, 04/25/15   TEMPORARY PACEMAKER N/A 05/26/2021   Procedure: TEMPORARY PACEMAKER;  Surgeon: [name], MD;  [facility];  Service: Cardiovascular;  Laterality: N/A;   VIDEO BRONCHOSCOPY Bilateral 05/09/2018   Procedure: VIDEO BRONCHOSCOPY WITH FLUORO;  Surgeon: [name], MD;  Location: [facility] END[facility];  Service: Cardiopulmonary;  Laterality: Bilateral;    Allergies  Allergies  Allergen Reactions   Iohexol Rash    CT scan contrast caused rash and itching    does fine with premeds.   Pravastatin     Numbness in feet    Iodine Hives and Rash    CT scan contrast caused rash and itching     Home Medications    Prior to Admission medications   Medication Sig Start Date End Date Taking? Authorizing Provider  acetaminophen (TYLENOL) 500 MG tablet Take 1,000 mg by mouth every 6 (six) hours as needed.    [provider]  albuterol (VENTOLIN HFA) 108 (90 Base) MCG/ACT inhaler Inhale 2 puffs into the lungs every 6  (six) hours as needed for wheezing or shortness of breath. 04/18/22   [name], DO  ALPRAZolam [name]) 0.25 MG tablet Take 1 tablet (0.25 mg total) by mouth at bedtime as needed for anxiety. 11/01/21   [name], DO  Artificial Tear Solution (SOOTHE XP OP) Place 1 drop into both eyes 2 (two) times daily.    [provider]  carvedilol (COREG) 25 MG tablet Take 1 tablet (25 mg total) by mouth 2 (two) times daily with a meal. 11/20/22   [name], MD  clopidogrel (PLAVIX) 75 MG tablet TAKE 1 TABLET BY MOUTH EVERY DAY 08/17/22   [name], MD  [employer] IRON 325 (65 Fe) MG tablet TAKE 1 TABLET BY MOUTH DAILY 08/17/21   Laverda Page B, NP  diphenhydrAMINE (BENADRYL) 50 MG tablet Take 1 tablet (50 mg total) by mouth once for 1 dose. Pt to take 50 mg of benadryl on 10/20/21 at 12:00 PM (noon). Please call [phone] with any questions.  10/12/21 11/30/21  Sterling Big, MD  fluticasone (FLONASE) 50 MCG/ACT nasal spray Place 1 spray into both nostrils daily. Patient taking differently: Place 1 spray into both nostrils daily as needed for allergies. 07/01/20   Coral Ceo, NP  levothyroxine (SYNTHROID) 175 MCG tablet TAKE 1 TABLET BY MOUTH EVERY DAY 09/17/22   Delynn Flavin M, DO  methocarbamol (ROBAXIN) 500 MG tablet Take 500 mg by mouth every 6 (six) hours as needed for muscle spasms. 08/11/19   [provider]  nitroGLYCERIN (NITROSTAT) 0.4 MG SL tablet Place 1 tablet (0.4 mg total) under the tongue every 5 (five) minutes as needed for chest pain. 11/30/21   [name], MD  pantoprazole (PROTONIX) 40 MG tablet TAKE 1 TABLET BY MOUTH DAILY BEFORE BREAKFAST 08/17/22   [name], MD  polyethylene glycol powder (GLYCOLAX/MIRALAX) 17 GM/SCOOP powder Take 1 Container by mouth daily.    [provider]  PRALUENT 150 MG/ML SOAJ INJECT 150 MG INTO THE SKIN EVERY 14 (FOURTEEN) DAYS. 04/02/22   [name], MD  predniSONE (STERAPRED UNI-PAK 21 TAB)  10 MG (21) TBPK tablet Use as directed 10/04/22   Jannifer Rodney A, FNP  senna-docusate (SENNA S) 8.6-50 MG tablet 1 to 2 twice daily for constipation 07/07/18   Ceasar Mons., PA-C  tamsulosin (FLOMAX) 0.4 MG CAPS capsule Take 0.4 mg by mouth at bedtime. 09/29/20   [provider]    Physical Exam    Vital Signs:  ROSAIRE EMERINE does not have vital signs available for review today.  Given telephonic nature of communication, physical exam is limited. AAOx3. NAD. Normal affect.  Speech and respirations are unlabored.  Accessory Clinical Findings    None  Assessment & Plan    Primary Cardiologist: [name], MD  Preoperative cardiovascular risk assessment.  Prostate biopsy by [name] on 01/15/2023.  Chart reviewed as part of pre-operative protocol coverage. According to the RCRI, patient has a 0.9% risk of MACE. Patient reports activity equivalent to >4.0 METS (~2 hours of strength and cardio a week, walks a lot including trails at [facility]).   Given past medical history and time since last visit, based on ACC/AHA guidelines, ERVIE MOTHERSHEAD would be at acceptable risk for the planned procedure without further cardiovascular testing.   Patient was advised that if he develops new symptoms prior to surgery to contact our office to arrange a follow-up appointment.  he verbalized understanding.  Per office protocol, he may hold Plavix for 5 days prior to procedure and should resume as soon as hemodynamically stable postoperatively.   I will route this recommendation to the requesting party via Epic fax function.  Please call with questions.  Time:   Today, I have spent 5 minutes with the patient with telehealth technology discussing medical history, symptoms, and management plan.     Carlos Levering, NP  01/14/2023, 8:23 AM

## 2023-01-15 DIAGNOSIS — R972 Elevated prostate specific antigen [PSA]: Secondary | ICD-10-CM | POA: Diagnosis not present

## 2023-01-15 DIAGNOSIS — D075 Carcinoma in situ of prostate: Secondary | ICD-10-CM | POA: Diagnosis not present

## 2023-01-15 DIAGNOSIS — C61 Malignant neoplasm of prostate: Secondary | ICD-10-CM | POA: Diagnosis not present

## 2023-01-17 ENCOUNTER — Telehealth: Payer: Self-pay | Admitting: *Deleted

## 2023-01-17 ENCOUNTER — Ambulatory Visit (INDEPENDENT_AMBULATORY_CARE_PROVIDER_SITE_OTHER): Payer: Medicare Other | Admitting: Gastroenterology

## 2023-01-17 ENCOUNTER — Encounter: Payer: Self-pay | Admitting: Gastroenterology

## 2023-01-17 VITALS — BP 124/74 | HR 86 | Ht 74.0 in | Wt 241.0 lb

## 2023-01-17 DIAGNOSIS — K625 Hemorrhage of anus and rectum: Secondary | ICD-10-CM | POA: Diagnosis not present

## 2023-01-17 DIAGNOSIS — Z7902 Long term (current) use of antithrombotics/antiplatelets: Secondary | ICD-10-CM | POA: Diagnosis not present

## 2023-01-17 NOTE — Progress Notes (Signed)
01/17/2023 Travis Padilla [id number] 09-Oct-1945   HISTORY OF PRESENT ILLNESS: This is a 77 year old male who is known to Dr. Leone Payor from a hospitalization in 2022.  He presents here today at the request of his PCP for evaluation of rectal bleeding.  He tells me that he has been having bright red rectal bleeding that occurs 2 or 3 times a week probably for about the past year or so.  He has bright red blood on toilet paper and in the stool.  He says that he has hemorrhoids so wonders if it is related to that.  He also tells me that usually couple times a week after eating he will have urgency to have a bowel movement and this will be companied by looser stool.  Otherwise he seems to be constipated in between and takes MiraLAX for that.  He is on Plavix as prescribed by Dr. [name].  Feb 2016 screening colonoscopy with Dr. [name]. Complete exam with excellent prep. Small hemorrhoids, few sigmoid colon diverticula    Past Medical History:  Diagnosis Date   Abnormal nuclear stress test    December, 2013   Anemia    Axillary adenopathy 04/24/2018   CAD (coronary artery disease)    Mild nonobstructive plaque in cath 2013   Chest pain    December, 2013   Cough with hemoptysis 04/22/2018   Dizziness    Dyslipidemia 07/14/2019   Encounter for antineoplastic immunotherapy 05/13/2018   Gout    Hemorrhoid    Hyperlipidemia    Hypertension    Hypothyroidism (acquired) 08/11/2018   Metastasis to lung (HCC) 05/19/2018   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) (HCC) dx'd 04/17/18   to LN, infrahilar mass, lung nodule and lt axilla   Obesity, unspecified 03/28/2009   Qualifier: Diagnosis of  By: [name], MD, [name]     Palpitations 03/28/2009   Qualifier: Diagnosis of  By: [name], MD, [name]     Right lower lobe lung mass 04/22/2018   Skin cancer    Sleep apnea    SNORING 03/28/2009   Qualifier: Diagnosis of  By: [name], MD, [name]     Spinal  stenosis    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   Past Surgical History:  Procedure Laterality Date   basal skin cancer N/A 2019   Nose   BELPHAROPTOSIS REPAIR Bilateral    CARDIAC CATHETERIZATION     CATARACT EXTRACTION W/ INTRAOCULAR LENS  IMPLANT, BILATERAL     COLONOSCOPY N/A 11/03/2014   Procedure: COLONOSCOPY;  Surgeon: Malissa Hippo, MD;  [facility];  Service: Endoscopy;  Laterality: N/A;  1225   CORONARY ATHERECTOMY N/A 05/26/2021   Procedure: CORONARY ATHERECTOMY;  Surgeon: [name], MD;  [facility];  Service: Cardiovascular;  Laterality: N/A;   CORONARY STENT INTERVENTION N/A 05/25/2021   Procedure: CORONARY STENT INTERVENTION;  Surgeon: [name], MD;  [facility];  Service: Cardiovascular;  Laterality: N/A;   CORONARY STENT INTERVENTION N/A 05/26/2021   Procedure: CORONARY STENT INTERVENTION;  Surgeon: [name], MD;  [facility];  Service: Cardiovascular;  Laterality: N/A;   CORONARY ULTRASOUND/IVUS N/A 05/26/2021   Procedure: Intravascular Ultrasound/IVUS;  Surgeon: [name], MD;  [facility];  Service: Cardiovascular;  Laterality: N/A;   ESOPHAGOGASTRODUODENOSCOPY (EGD) WITH PROPOFOL N/A 05/29/2021   Procedure: ESOPHAGOGASTRODUODENOSCOPY (EGD) WITH PROPOFOL;  Surgeon: Leone Payor,  Maryjean Morn, MD;  Location: [facility] ENDOSCOPY;  Service: Endoscopy;  Laterality: N/A;   IR CV LINE INJECTION  08/24/2020   IR FLUORO GUIDED NEEDLE PLC ASPIRATION/INJECTION LOC  11/28/2020   IR IMAGING GUIDED PORT INSERTION  07/11/2018   KNEE CARTILAGE SURGERY Left    Left knee   LEFT HEART CATH AND CORONARY ANGIOGRAPHY N/A 05/25/2021   Procedure: LEFT HEART CATH AND CORONARY ANGIOGRAPHY;  Surgeon: [name], MD;  [facility];  Service: Cardiovascular;  Laterality: N/A;   SKIN CANCER EXCISION  12/2014, 04/25/15   TEMPORARY PACEMAKER N/A 05/26/2021    Procedure: TEMPORARY PACEMAKER;  Surgeon: [name], MD;  [facility];  Service: Cardiovascular;  Laterality: N/A;   VIDEO BRONCHOSCOPY Bilateral 05/09/2018   Procedure: VIDEO BRONCHOSCOPY WITH FLUORO;  Surgeon: [name], MD;  Location: [facility] ENDOSCOPY;  Service: Cardiopulmonary;  Laterality: Bilateral;    reports that he quit smoking about 52 years ago. His smoking use included cigarettes. He started smoking about 54 years ago. He has a 0.40 pack-year smoking history. He has never used smokeless tobacco. He reports that he does not drink alcohol and does not use drugs. family history includes COPD in his brother; Epilepsy in his brother; Healthy in his sister, sister, and sister; Heart attack in his father; Heart failure in his mother; Neuropathy in his brother; Parkinson's disease in his mother; Skin cancer in his sister. Allergies  Allergen Reactions   Iohexol Rash    CT scan contrast caused rash and itching    does fine with premeds.   Pravastatin     Numbness in feet    Iodine Hives and Rash    CT scan contrast caused rash and itching       Outpatient Encounter Medications as of 01/17/2023  Medication Sig   acetaminophen (TYLENOL) 500 MG tablet Take 1,000 mg by mouth every 6 (six) hours as needed.   albuterol (VENTOLIN HFA) 108 (90 Base) MCG/ACT inhaler Inhale 2 puffs into the lungs every 6 (six) hours as needed for wheezing or shortness of breath.   ALPRAZolam (XANAX) 0.25 MG tablet Take 1 tablet (0.25 mg total) by mouth at bedtime as needed for anxiety.   Artificial Tear Solution (SOOTHE XP OP) Place 1 drop into both eyes 2 (two) times daily.   carvedilol (COREG) 25 MG tablet Take 1 tablet (25 mg total) by mouth 2 (two) times daily with a meal.   clopidogrel (PLAVIX) 75 MG tablet TAKE 1 TABLET BY MOUTH EVERY DAY   [employer]S IRON 325 (65 Fe) MG tablet TAKE 1 TABLET BY MOUTH DAILY   fluticasone (FLONASE) 50 MCG/ACT nasal spray Place 1 spray into both nostrils  daily. (Patient taking differently: Place 1 spray into both nostrils daily as needed for allergies.)   levothyroxine (SYNTHROID) 175 MCG tablet TAKE 1 TABLET BY MOUTH EVERY DAY   methocarbamol (ROBAXIN) 500 MG tablet Take 500 mg by mouth every 6 (six) hours as needed for muscle spasms.   nitroGLYCERIN (NITROSTAT) 0.4 MG SL tablet Place 1 tablet (0.4 mg total) under the tongue every 5 (five) minutes as needed for chest pain.   pantoprazole (PROTONIX) 40 MG tablet TAKE 1 TABLET BY MOUTH DAILY BEFORE BREAKFAST   polyethylene glycol powder (GLYCOLAX/MIRALAX) 17 GM/SCOOP powder Take 1 Container by mouth daily.   PRALUENT 150 MG/ML SOAJ INJECT 150 MG INTO THE SKIN EVERY 14 (FOURTEEN) DAYS.   predniSONE (STERAPRED UNI-PAK 21 TAB) 10 MG (21) TBPK tablet Use  as directed   senna-docusate (SENNA S) 8.6-50 MG tablet 1 to 2 twice daily for constipation   tamsulosin (FLOMAX) 0.4 MG CAPS capsule Take 0.4 mg by mouth at bedtime.   diphenhydrAMINE (BENADRYL) 50 MG tablet Take 1 tablet (50 mg total) by mouth once for 1 dose. Pt to take 50 mg of benadryl on 10/20/21 at 12:00 PM (noon). Please call [phone] with any questions.   No facility-administered encounter medications on file as of 01/17/2023.     REVIEW OF SYSTEMS  : All other systems reviewed and negative except where noted in the History of Present Illness.   PHYSICAL EXAM: Ht 6\' 2"  (1.88 m)   Wt 241 lb (109.3 kg)   BMI 30.94 kg/m  General: Well developed white male in no acute distress Head: Normocephalic and atraumatic Eyes:  Sclerae anicteric, conjunctiva pink. Ears: Normal auditory acuity Lungs: Clear throughout to auscultation; no W/R/R. Heart: Regular rate and rhythm; no M/R/G. Abdomen: Soft, non-distended.  BS present.  Non-tender. Rectal:  Will be done at the time of colonoscopy. Musculoskeletal: Symmetrical with no gross deformities  Skin: No lesions on visible extremities Extremities: No edema  Neurological: Alert oriented x 4,  grossly non-focal Psychological:  Alert and cooperative. Normal mood and affect  ASSESSMENT AND PLAN: *Rectal bleeding:  Suspect hemorrhoids, but occurs at least twice a week and last colonoscopy was over 8 years ago. *Intermittent post-prandial loose stools with urgency:  Occurs a couple of times per week, other times seems constipated.  ? If it is dietary related. *Chronic antiplatelet use with Plavix:  Hold Plavix 5 days before procedure - will instruct when and how to resume after procedure. Risks and benefits of procedure including bleeding, perforation, infection, missed lesions, medication reactions and possible hospitalization or surgery if complications occur explained. Additional rare but real risk of cardiovascular event such as heart attack or ischemia/infarct of other organs off of Plavix explained and need to seek urgent help if this occurs. Will communicate by phone or EMR with patient's prescribing provider, Dr. [name], to confirm that holding Plavix is reasonable in this case.    *Will schedule for colonoscopy with Dr. Leone Payor.  If hemorrhoids are deemed to be the cause of his bleeding, ? Candidate for banding since he is on Plavix.   CC:  [name], DO

## 2023-01-17 NOTE — Patient Instructions (Addendum)
You will be contacted by our office prior to your procedure for directions on holding your Plavix.  If you do not hear from our office 1 week prior to your scheduled procedure, please call [phone] to discuss.    You have been scheduled for a colonoscopy. Please follow written instructions given to you at your visit today.  Please pick up your prep supplies at the pharmacy within the next 1-3 days. If you use inhalers (even only as needed), please bring them with you on the day of your procedure.  _______________________________________________________  If your blood pressure at your visit was 140/90 or greater, please contact your primary care physician to follow up on this.  _______________________________________________________  If you are age 55 or older, your body mass index should be between 23-30. Your Body mass index is 30.94 kg/m. If this is out of the aforementioned range listed, please consider follow up with your Primary Care Provider.  If you are age 68 or younger, your body mass index should be between 19-25. Your Body mass index is 30.94 kg/m. If this is out of the aformentioned range listed, please consider follow up with your Primary Care Provider.   ________________________________________________________  The [facility] providers would like to encourage you to use [facility] to communicate with providers for non-urgent requests or questions.  Due to long hold times on the telephone, sending your provider a message by [facility] may be a faster and more efficient way to get a response.  Please allow 48 business hours for a response.  Please remember that this is for non-urgent requests.  _______________________________________________________

## 2023-01-17 NOTE — Telephone Encounter (Signed)
[facility] Pre-operative Risk Assessment     Request for surgical clearance:     Endoscopy Procedure  What type of surgery is being performed?     colonoscopy  When is this surgery scheduled?     Wednesday 02/13/23  What type of clearance is required ?   Pharmacy  Are there any medications that need to be held prior to surgery and how long? Plavix 5 days  Practice name and name of physician performing surgery?      [facility]  What is your office phone and fax number?      Phone- [phone]  Fax- [phone]  Anesthesia type (None, local, MAC, general) ?       MAC

## 2023-01-17 NOTE — Telephone Encounter (Signed)
   Patient Name: Travis Padilla  DOB: 15-Mar-1946 MRN: [id number]  Primary Cardiologist: [name], MD  Chart reviewed as part of pre-operative protocol coverage. Pre-op clearance already addressed by colleagues in earlier phone notes. To summarize recommendations:  -We spoke to this patient 3 days ago and provided cardiac clearance for upcoming prostate biopsy (see phone note from 01/14/2023).  Same clearance would apply for upcoming colonoscopy scheduled for June 5.  Patient can hold Plavix x 5 days prior to colonoscopy and can resume when medically safe to do so.  Will route this bundled recommendation to requesting provider via Epic fax function and remove from pre-op pool. Please call with questions.  Sharlene Dory, PA-C 01/17/2023, 11:10 AM

## 2023-01-22 DIAGNOSIS — C61 Malignant neoplasm of prostate: Secondary | ICD-10-CM | POA: Diagnosis not present

## 2023-01-23 ENCOUNTER — Other Ambulatory Visit (HOSPITAL_COMMUNITY): Payer: Self-pay | Admitting: Urology

## 2023-01-23 DIAGNOSIS — C61 Malignant neoplasm of prostate: Secondary | ICD-10-CM

## 2023-01-31 DIAGNOSIS — N401 Enlarged prostate with lower urinary tract symptoms: Secondary | ICD-10-CM | POA: Diagnosis not present

## 2023-02-07 NOTE — Telephone Encounter (Signed)
Patient informed. 

## 2023-02-13 ENCOUNTER — Encounter: Payer: Self-pay | Admitting: Internal Medicine

## 2023-02-13 ENCOUNTER — Ambulatory Visit (AMBULATORY_SURGERY_CENTER): Payer: Medicare Other | Admitting: Internal Medicine

## 2023-02-13 VITALS — BP 91/67 | HR 77 | Temp 97.5°F | Resp 9 | Ht 74.0 in | Wt 241.0 lb

## 2023-02-13 DIAGNOSIS — K625 Hemorrhage of anus and rectum: Secondary | ICD-10-CM | POA: Diagnosis not present

## 2023-02-13 DIAGNOSIS — D128 Benign neoplasm of rectum: Secondary | ICD-10-CM | POA: Diagnosis not present

## 2023-02-13 DIAGNOSIS — D124 Benign neoplasm of descending colon: Secondary | ICD-10-CM

## 2023-02-13 DIAGNOSIS — I10 Essential (primary) hypertension: Secondary | ICD-10-CM | POA: Diagnosis not present

## 2023-02-13 DIAGNOSIS — G4733 Obstructive sleep apnea (adult) (pediatric): Secondary | ICD-10-CM | POA: Diagnosis not present

## 2023-02-13 HISTORY — PX: COLONOSCOPY WITH PROPOFOL: SHX5780

## 2023-02-13 MED ORDER — SODIUM CHLORIDE 0.9 % IV SOLN: 500.0000 mL | Freq: Once | INTRAVENOUS | Status: DC

## 2023-02-13 NOTE — Op Note (Signed)
[facility] Patient Name: Travis Padilla Procedure Date: 02/13/2023 1:47 PM MRN: [id number] Endoscopist: [name] , MD, [id number] Age: 77 Referring MD:  Date of Birth: 1946-04-23 Gender: Male Account #: [id number] Procedure:                Colonoscopy Indications:              Rectal bleeding, Change in bowel habits Medicines:                Monitored Anesthesia Care Procedure:                Pre-Anesthesia Assessment:                           - Prior to the procedure, a History and Physical                            was performed, and patient medications and                            allergies were reviewed. The patient's tolerance of                            previous anesthesia was also reviewed. The risks                            and benefits of the procedure and the sedation                            options and risks were discussed with the patient.                            All questions were answered, and informed consent                            was obtained. Prior Anticoagulants: The patient                            last took Plavix (clopidogrel) 5 days prior to the                            procedure. ASA Grade Assessment: III - A patient                            with severe systemic disease. After reviewing the                            risks and benefits, the patient was deemed in                            satisfactory condition to undergo the procedure.                           After obtaining informed consent, the colonoscope  was passed under direct vision. Throughout the                            procedure, the patient's blood pressure, pulse, and                            oxygen saturations were monitored continuously. The                            Olympus CF-HQ190L [id number]) Colonoscope was                            introduced through the anus and advanced to the the                            cecum,  identified by appendiceal orifice and                            ileocecal valve. The colonoscopy was performed                            without difficulty. The patient tolerated the                            procedure well. The quality of the bowel                            preparation was good. The terminal ileum, ileocecal                            valve, appendiceal orifice, and rectum were                            photographed. Scope In: 1:58:35 PM Scope Out: 2:17:21 PM Scope Withdrawal Time: 0 hours 16 minutes 35 seconds  Total Procedure Duration: 0 hours 18 minutes 46 seconds  Findings:                 Hemorrhoids were found on perianal exam.                           Five sessile polyps were found in the rectum and                            descending colon. The polyps were diminutive in                            size. These polyps were removed with a cold snare.                            Resection and retrieval were complete. Verification                            of patient identification for the specimen was  done. Estimated blood loss was minimal.                           Multiple large-mouthed diverticula were found in                            the sigmoid colon.                           External and internal hemorrhoids were found.                           The exam was otherwise without abnormality on                            direct and retroflexion views. Complications:            No immediate complications. Estimated Blood Loss:     Estimated blood loss was minimal. Impression:               - Hemorrhoids found on perianal exam.                           - Five diminutive polyps in the rectum and in the                            descending colon, removed with a cold snare.                            Resected and retrieved.                           - Diverticulosis in the sigmoid colon.                           - External and  internal hemorrhoids.                           - The examination was otherwise normal on direct                            and retroflexion views. No cause of increased                            gastro-colic reflex seen. Recommendation:           - Patient has a contact number available for                            emergencies. The signs and symptoms of potential                            delayed complications were discussed with the                            patient. Return to normal activities tomorrow.  Written discharge instructions were provided to the                            patient.                           - Resume previous diet.                           - Continue present medications.                           - Resume Plavix (clopidogrel) at prior dose                            tomorrow.                           - Await pathology results.                           - No repeat colonoscopy due to age. [name], MD 02/13/2023 2:28:33 PM This report has been signed electronically.

## 2023-02-13 NOTE — Progress Notes (Signed)
Report to PACU, RN, vss, BBS= Clear.  

## 2023-02-13 NOTE — Progress Notes (Signed)
History and Physical Interval Note:  02/13/2023 1:56 PM  Travis Padilla  has presented today for endoscopic procedure(s), with the diagnosis of  Encounter Diagnosis  Name Primary?   Rectal bleeding Yes  .  The various methods of evaluation and treatment have been discussed with the patient and/or family. After consideration of risks, benefits and other options for treatment, the patient has consented to  the endoscopic procedure(s).   The patient's history has been reviewed, patient examined, no change in status, stable for endoscopic procedure(s).  I have reviewed the patient's chart and labs.  Questions were answered to the patient's satisfaction.     [name], MD, Clement[facility] Graham

## 2023-02-13 NOTE — Patient Instructions (Addendum)
The bleeding was coming from hemorrhoids - you have internal and external hemorrhoids. Since they have stopped bleeding you can observe for now. Will give you a handout about hemorrhoid banding, which is an option.  I did remove 5 tiny polyps that all look benign. Will analyze them and let you know.  You also have a condition called diverticulosis - common and not usually a problem. Please read the handout provided.  I did not see any signs of inflammation or cause of bowel habit changes. That could be diet and/or medications.  I appreciate the opportunity to care for you. [name], MD, FACG   YOU HAD AN ENDOSCOPIC PROCEDURE TODAY AT THE [facility]:   Refer to the procedure report that was given to you for any specific questions about what was found during the examination.  If the procedure report does not answer your questions, please call your gastroenterologist to clarify.  If you requested that your care partner not be given the details of your procedure findings, then the procedure report has been included in a sealed envelope for you to review at your convenience later.  YOU SHOULD EXPECT: Some feelings of bloating in the abdomen. Passage of more gas than usual.  Walking can help get rid of the air that was put into your GI tract during the procedure and reduce the bloating. If you had a lower endoscopy (such as a colonoscopy or flexible sigmoidoscopy) you may notice spotting of blood in your stool or on the toilet paper. If you underwent a bowel prep for your procedure, you may not have a normal bowel movement for a few days.  Please Note:  You might notice some irritation and congestion in your nose or some drainage.  This is from the oxygen used during your procedure.  There is no need for concern and it should clear up in a day or so.  SYMPTOMS TO REPORT IMMEDIATELY:  Following lower endoscopy (colonoscopy or flexible sigmoidoscopy):  Excessive amounts of blood in  the stool  Significant tenderness or worsening of abdominal pains  Swelling of the abdomen that is new, acute  Fever of 100F or higher  For urgent or emergent issues, a gastroenterologist can be reached at any hour by calling [phone]. Do not use MyChart messaging for urgent concerns.    DIET:  We do recommend a small meal at first, but then you may proceed to your regular diet.  Drink plenty of fluids but you should avoid alcoholic beverages for 24 hours.  ACTIVITY:  You should plan to take it easy for the rest of today and you should NOT DRIVE or use heavy machinery until tomorrow (because of the sedation medicines used during the test).    FOLLOW UP: Our staff will call the number listed on your records the next business day following your procedure.  We will call around 7:15- 8:00 am to check on you and address any questions or concerns that you may have regarding the information given to you following your procedure. If we do not reach you, we will leave a message.     If any biopsies were taken you will be contacted by phone or by letter within the next 1-3 weeks.  Please call us at [phone] if you have not heard about the biopsies in 3 weeks.    SIGNATURES/CONFIDENTIALITY: You and/or your care partner have signed paperwork which will be entered into your electronic medical record.  These signatures attest  to the fact that that the information above on your After Visit Summary has been reviewed and is understood.  Full responsibility of the confidentiality of this discharge information lies with you and/or your care-partner.

## 2023-02-13 NOTE — Progress Notes (Signed)
Called to room to assist during endoscopic procedure.  Patient ID and intended procedure confirmed with present staff. Received instructions for my participation in the procedure from the performing physician.  

## 2023-02-13 NOTE — Progress Notes (Signed)
History and Physical Interval Note:  02/13/2023 1:49 PM  Travis Padilla  has presented today for endoscopic procedure(s), with the diagnosis of  Encounter Diagnosis  Name Primary?   Rectal bleeding Yes  .  The various methods of evaluation and treatment have been discussed with the patient and/or family. After consideration of risks, benefits and other options for treatment, the patient has consented to  the endoscopic procedure(s).   The patient's history has been reviewed, patient examined, no change in status, stable for endoscopic procedure(s).  I have reviewed the patient's chart and labs.  Questions were answered to the patient's satisfaction.     [name], MD, [facility]

## 2023-02-14 ENCOUNTER — Telehealth: Payer: Self-pay | Admitting: *Deleted

## 2023-02-14 NOTE — Telephone Encounter (Signed)
Post procedure follow up phone call. No answer at number given.  Left message on voicemail.  

## 2023-02-18 ENCOUNTER — Ambulatory Visit (HOSPITAL_COMMUNITY)
Admission: RE | Admit: 2023-02-18 | Discharge: 2023-02-18 | Disposition: A | Discharge status: Home / Self Care | Payer: Medicare Other | Source: Ambulatory Visit | Attending: Urology | Admitting: Urology

## 2023-02-18 DIAGNOSIS — C3490 Malignant neoplasm of unspecified part of unspecified bronchus or lung: Secondary | ICD-10-CM | POA: Diagnosis not present

## 2023-02-18 DIAGNOSIS — C61 Malignant neoplasm of prostate: Secondary | ICD-10-CM | POA: Insufficient documentation

## 2023-02-18 MED ORDER — TECHNETIUM TC 99M MEDRONATE IV KIT
20.0000 | PACK | Freq: Once | INTRAVENOUS | Status: AC | PRN
  Administered 2023-02-18: 21.1 via INTRAVENOUS

## 2023-02-20 ENCOUNTER — Encounter: Payer: Self-pay | Admitting: Radiation Oncology

## 2023-02-20 ENCOUNTER — Other Ambulatory Visit: Payer: Self-pay | Admitting: Medical Oncology

## 2023-02-20 DIAGNOSIS — T508X5D Adverse effect of diagnostic agents, subsequent encounter: Secondary | ICD-10-CM

## 2023-02-20 MED ORDER — PREDNISONE 50 MG PO TABS
ORAL_TABLET | ORAL | 5 refills | Status: DC
Start: 2023-02-20 — End: 2023-08-02

## 2023-02-20 NOTE — Progress Notes (Signed)
Prednisone for CT scan prep sent to pt  pharmacy.

## 2023-02-20 NOTE — Progress Notes (Signed)
GU Location of Tumor / Histology:  Prostate Ca  If Prostate Cancer, Gleason Score is (4 + 3) and PSA is (5.43 on 11/22/2022)  Biopsies      Past/Anticipated interventions by urology, if any:   01/23/2023 Dr. Jettie Pagan   Past/Anticipated interventions by medical oncology, if any: NA  Weight changes, if any: No  IPSS:  12 SHIM:  18  Bowel/Bladder complaints, if any:  No bowel issues reports urinary frequency.  Nausea/Vomiting, if any: No  Pain issues, if any:  0/10  SAFETY ISSUES: Prior radiation?  Yes, Lung Ca (2023) Pacemaker/ICD? No Possible current pregnancy? Male Is the patient on methotrexate? No  Current Complaints / other details:

## 2023-02-21 ENCOUNTER — Inpatient Hospital Stay: Payer: Medicare Other

## 2023-02-21 ENCOUNTER — Ambulatory Visit (HOSPITAL_COMMUNITY)
Admission: RE | Admit: 2023-02-21 | Discharge: 2023-02-21 | Disposition: A | Discharge status: Home / Self Care | Payer: Medicare Other | Source: Ambulatory Visit | Attending: Internal Medicine | Admitting: Internal Medicine

## 2023-02-21 ENCOUNTER — Inpatient Hospital Stay: Payer: Medicare Other | Attending: Internal Medicine

## 2023-02-21 ENCOUNTER — Other Ambulatory Visit: Payer: Self-pay

## 2023-02-21 DIAGNOSIS — I2510 Atherosclerotic heart disease of native coronary artery without angina pectoris: Secondary | ICD-10-CM | POA: Insufficient documentation

## 2023-02-21 DIAGNOSIS — Z79890 Hormone replacement therapy (postmenopausal): Secondary | ICD-10-CM | POA: Insufficient documentation

## 2023-02-21 DIAGNOSIS — C3412 Malignant neoplasm of upper lobe, left bronchus or lung: Secondary | ICD-10-CM | POA: Insufficient documentation

## 2023-02-21 DIAGNOSIS — N200 Calculus of kidney: Secondary | ICD-10-CM | POA: Diagnosis not present

## 2023-02-21 DIAGNOSIS — C3490 Malignant neoplasm of unspecified part of unspecified bronchus or lung: Secondary | ICD-10-CM

## 2023-02-21 DIAGNOSIS — C61 Malignant neoplasm of prostate: Secondary | ICD-10-CM | POA: Insufficient documentation

## 2023-02-21 DIAGNOSIS — E039 Hypothyroidism, unspecified: Secondary | ICD-10-CM | POA: Insufficient documentation

## 2023-02-21 LAB — CBC WITH DIFFERENTIAL (CANCER CENTER ONLY)
Abs Immature Granulocytes: 0.05 10*3/uL (ref 0.00–0.07)
Basophils Absolute: 0 10*3/uL (ref 0.0–0.1)
Basophils Relative: 0 %
Eosinophils Absolute: 0 10*3/uL (ref 0.0–0.5)
Eosinophils Relative: 1 %
HCT: 37.3 % — ABNORMAL LOW (ref 39.0–52.0)
Hemoglobin: 13.3 g/dL (ref 13.0–17.0)
Immature Granulocytes: 1 %
Lymphocytes Relative: 11 %
Lymphs Abs: 0.9 10*3/uL (ref 0.7–4.0)
MCH: 34.8 pg — ABNORMAL HIGH (ref 26.0–34.0)
MCHC: 35.7 g/dL (ref 30.0–36.0)
MCV: 97.6 fL (ref 80.0–100.0)
Monocytes Absolute: 0.7 10*3/uL (ref 0.1–1.0)
Monocytes Relative: 10 %
Neutro Abs: 5.8 10*3/uL (ref 1.7–7.7)
Neutrophils Relative %: 77 %
Platelet Count: 167 10*3/uL (ref 150–400)
RBC: 3.82 MIL/uL — ABNORMAL LOW (ref 4.22–5.81)
RDW: 11.6 % (ref 11.5–15.5)
WBC Count: 7.5 10*3/uL (ref 4.0–10.5)
nRBC: 0 % (ref 0.0–0.2)

## 2023-02-21 LAB — CMP (CANCER CENTER ONLY)
ALT: 10 U/L (ref 0–44)
AST: 13 U/L — ABNORMAL LOW (ref 15–41)
Albumin: 4 g/dL (ref 3.5–5.0)
Alkaline Phosphatase: 56 U/L (ref 38–126)
Anion gap: 6 (ref 5–15)
BUN: 21 mg/dL (ref 8–23)
CO2: 25 mmol/L (ref 22–32)
Calcium: 9.4 mg/dL (ref 8.9–10.3)
Chloride: 109 mmol/L (ref 98–111)
Creatinine: 0.97 mg/dL (ref 0.61–1.24)
GFR, Estimated: >60 mL/min (ref >60)
Glucose, Bld: 140 mg/dL — ABNORMAL HIGH (ref 70–99)
Potassium: 4.6 mmol/L (ref 3.5–5.1)
Sodium: 140 mmol/L (ref 135–145)
Total Bilirubin: 0.7 mg/dL (ref 0.3–1.2)
Total Protein: 7.4 g/dL (ref 6.5–8.1)

## 2023-02-21 MED ORDER — HEPARIN SOD (PORK) LOCK FLUSH 100 UNIT/ML IV SOLN
500.0000 [IU] | Freq: Once | INTRAVENOUS | Status: AC
  Administered 2023-02-21: 500 [IU] via INTRAVENOUS

## 2023-02-21 MED ORDER — HEPARIN SOD (PORK) LOCK FLUSH 100 UNIT/ML IV SOLN
INTRAVENOUS | Status: AC
  Filled 2023-02-21: qty 5

## 2023-02-21 MED ORDER — IOHEXOL 300 MG/ML  SOLN
100.0000 mL | Freq: Once | INTRAMUSCULAR | Status: AC | PRN
  Administered 2023-02-21: 100 mL via INTRAVENOUS

## 2023-02-21 NOTE — Op Note (Signed)
Pt presented to CT with proper premedication regimen. Patient still had a rash reaction to the contrast. Ordering oncologist MD made aware

## 2023-02-22 ENCOUNTER — Ambulatory Visit
Admission: RE | Admit: 2023-02-22 | Discharge: 2023-02-22 | Disposition: A | Discharge status: Home / Self Care | Payer: Medicare Other | Source: Ambulatory Visit | Attending: Radiation Oncology | Admitting: Radiation Oncology

## 2023-02-22 ENCOUNTER — Encounter: Payer: Self-pay | Admitting: Internal Medicine

## 2023-02-22 ENCOUNTER — Encounter: Payer: Self-pay | Admitting: Urology

## 2023-02-22 VITALS — Ht 74.0 in | Wt 235.0 lb

## 2023-02-22 DIAGNOSIS — C61 Malignant neoplasm of prostate: Secondary | ICD-10-CM | POA: Diagnosis not present

## 2023-02-22 DIAGNOSIS — Z191 Hormone sensitive malignancy status: Secondary | ICD-10-CM | POA: Diagnosis not present

## 2023-02-22 NOTE — Progress Notes (Signed)
Radiation Oncology         [phone]) [phone] ________________________________  Initial Outpatient Consultation - Conducted via Telephone due to current COVID-19 concerns for limiting patient exposure  Name: Travis Padilla MRN: [id number]  Date: 02/22/2023  DOB: 06-27-1946  WU:[id number], Rozell Searing, DO  Jannifer Hick, MD   REFERRING PHYSICIAN: Jannifer Hick, MD  DIAGNOSIS: 77 y.o. gentleman with Stage T1c adenocarcinoma of the prostate with Gleason score of 4+3, and PSA of 5.43.    ICD-10-CM   1. Malignant neoplasm of prostate [facility])  C61       HISTORY OF PRESENT ILLNESS: Travis Padilla is a 77 y.o. male with a diagnosis of prostate cancer.  He is an established urology patient, followed by [facility] for numerous GU issues including BPH with LUTS, kidney stones s/p lithotripsy in 2016, bladder lesion s/p TURBT with benign pathology, elevated PSA, prostatitis, orchitis, and a penile lesion.  He has had a previous, isolated elevated PSA at 22 back in 2017 during an episode of prostatitis but the PSA level returned to normal following treatment.  More recently, at the time of a follow-up visit with [name] in December 2023, he was noted to have an elevated PSA of 5.86. Digital rectal examination performed at that time showed no nodules. A repeat PSA in 11/2022 showed persistent elevation at 5.43.  Therefore, the patient proceeded to transrectal ultrasound with 12 biopsies of the prostate on 01/15/23.  The prostate volume measured 59 cc.  Out of 12 core biopsies, 7 were positive.  The maximum Gleason score was 4+3, and this was seen in the left mid lateral, left base, and left mid. Additionally, Gleason 3+4 was seen in the left apex lateral, left apex, and right base lateral, and Gleason 3+3 in the right base (with perineural invasion).  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 Mitzi Hansen, followed by systemic chemotherapy with carboplatin, paclitaxel and Keytruda 200 mg x 4 cycles.  He then proceeded with maintenance immunotherapy with single agent Keytruda 200 mg IV every 3 weeks and completed 41 cycles but had to discontinue in March 2022 secondary to intolerance.  He has since been followed in surveillance and had an enlarging left upper lobe lung mass, felt most likely to be a synchronous early-stage lung cancer and treated with a 3 fraction course of SBRT in May 2023, under the care of Dr. Mitzi Hansen.  Surveillance CT imaging since that time has not shown any evidence of disease recurrence or progression and he remains in observation only.  The patient reviewed the biopsy results with his urologist and he has kindly been referred today for discussion of potential radiation treatment options.   PREVIOUS RADIATION THERAPY: Yes  01/09/22 - 01/19/22: LUL / 60 Gy in 5 fractions (SBRT)  04/30/18 - 05/21/18: RLL and left axilla / 37.5 Gy in 15 fractions  PAST MEDICAL HISTORY:  Past Medical History:  Diagnosis Date   Abnormal nuclear stress test    December, 2013   Anemia    Axillary adenopathy 04/24/2018   CAD (coronary artery disease)    Mild nonobstructive plaque in cath 2013   Chest pain    December, 2013   Cough with hemoptysis 04/22/2018   Dizziness    Dyslipidemia 07/14/2019  Encounter for antineoplastic immunotherapy 05/13/2018   Gout    Hemorrhoid    Hyperlipidemia    Hypertension    Hypothyroidism (acquired) 08/11/2018   Metastasis to lung (HCC) 05/19/2018   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) [facility]) dx'd 04/17/18   to LN, infrahilar mass, lung nodule and lt axilla   Obesity, unspecified 03/28/2009   Qualifier: Diagnosis of  By: [name], MD, [name]     Palpitations 03/28/2009   Qualifier: Diagnosis of  By: [name], MD, [name]     Right lower lobe lung mass 04/22/2018   Skin cancer    Sleep apnea    SNORING 03/28/2009   Qualifier: Diagnosis of  By: [name], MD, [name]     Spinal stenosis    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      PAST SURGICAL HISTORY: Past Surgical History:  Procedure Laterality Date   basal skin cancer N/A 2019   Nose   BELPHAROPTOSIS REPAIR Bilateral    CARDIAC CATHETERIZATION     CATARACT EXTRACTION W/ INTRAOCULAR LENS  IMPLANT, BILATERAL     COLONOSCOPY N/A 11/03/2014   Procedure: COLONOSCOPY;  Surgeon: Malissa Hippo, MD;  Location: [facility];  Service: Endoscopy;  Laterality: N/A;  1225   CORONARY ATHERECTOMY N/A 05/26/2021   Procedure: CORONARY ATHERECTOMY;  Surgeon: [name], MD;  Location: [facility] INVASIVE CV LAB;  Service: Cardiovascular;  Laterality: N/A;   CORONARY STENT INTERVENTION N/A 05/25/2021   Procedure: CORONARY STENT INTERVENTION;  Surgeon: [name], MD;  Location: [facility] INVASIVE CV LAB;  Service: Cardiovascular;  Laterality: N/A;   CORONARY STENT INTERVENTION N/A 05/26/2021   Procedure: CORONARY STENT INTERVENTION;  Surgeon: [name], MD;  Location: [facility] INVAS[facility] CV LAB;  Service: Cardiovascular;  Laterality: N/A;   CORONARY ULTRASOUND/IVUS N/A 05/26/2021   Procedure: Intravascular Ultrasound/IVUS;  Surgeon: [name], MD;  Location: [facility] CV LAB;  Service: Cardiovascular;  Laterality: N/A;   ESOPHAGOGASTRODUODENOSCOPY (EGD) WITH PROPOFOL N/A 05/29/2021   Procedure: ESOPHAGOGASTRODUODENOSCOPY (EGD) WITH PROPOFOL;  Surgeon: [name], MD;  Location: [facility] ENDOSCOPY;  Service: Endoscopy;  Laterality: N/A;   IR CV LINE INJECTION  08/24/2020   IR FLUORO GUIDED NEEDLE PLC ASPIRATION/INJECTION LOC  11/28/2020   IR IMAGING GUIDED PORT INSERTION  07/11/2018   KNEE CARTILAGE SURGERY Left    Left knee   LEFT HEART CATH AND CORONARY ANGIOGRAPHY N/A 05/25/2021   Procedure: LEFT HEART CATH AND CORONARY  ANGIOGRAPHY;  Surgeon: [name], MD;  Location: [facility] CV LAB;  Service: Cardiovascular;  Laterality: N/A;   PROSTATE BIOPSY     SKIN CANCER EXCISION  12/2014, 04/25/15   TEMPORARY PACEMAKER N/A 05/26/2021   Procedure: TEMPORARY PACEMAKER;  Surgeon: [name], MD;  Location: [facility] INVAS[facility] CV LAB;  Service: Cardiovascular;  Laterality: N/A;   VIDEO BRONCHOSCOPY Bilateral 05/09/2018   Procedure: VIDEO BRONCHOSCOPY WITH FLUORO;  Surgeon: [name], MD;  Location: [facility] ENDOSCOPY;  Service: Cardiopulmonary;  Laterality: Bilateral;    FAMILY HISTORY:  Family History  Problem Relation Age of Onset   Heart failure Mother    Parkinson's disease Mother    Heart attack Father    Healthy Sister    Skin cancer Sister    Healthy Sister    Healthy Sister    Neuropathy Brother    COPD Brother    Epilepsy Brother    Colon cancer Neg Hx    Pancreatic cancer  Neg Hx    Prostate cancer Neg Hx    Rectal cancer Neg Hx    Stomach cancer Neg Hx     SOCIAL HISTORY:  Social History   Socioeconomic History   Marital status: Widowed    Spouse name: Not on file   Number of children: 1   Years of education: Not on file   Highest education level: Not on file  Occupational History   Occupation: Emergency planning/management officer  Tobacco Use   Smoking status: Former    Packs/day: 0.20    Years: 2.00    Additional pack years: 0.00    Total pack years: 0.40    Types: Cigarettes    Start date: 10/01/1968    Quit date: 1972    Years since quitting: 52.4   Smokeless tobacco: Never  Vaping Use   Vaping Use: Never used  Substance and Sexual Activity   Alcohol use: Yes    Alcohol/week: 2.0 standard drinks of alcohol    Types: 2 Glasses of wine per week    Comment: occ   Drug use: No   Sexual activity: Not Currently  Other Topics Concern   Not on file  Social History Narrative   Unable to ask intimate partner violence questions, wife present.    Pt's wife died in 2021-10-01.   Social  Determinants of Health   Financial Resource Strain: Low Risk  (11/22/2022)   Overall Financial Resource Strain (CARDIA)    Difficulty of Paying Living Expenses: Not hard at all  Food Insecurity: No Food Insecurity (02/22/2023)   Hunger Vital Sign    Worried About Running Out of Food in the Last Year: Never true    Ran Out of Food in the Last Year: Never true  Transportation Needs: No Transportation Needs (02/22/2023)   PRAPARE - Administrator, Civil Service (Medical): No    Lack of Transportation (Non-Medical): No  Physical Activity: Sufficiently Active (11/22/2022)   Exercise Vital Sign    Days of Exercise per Week: 4 days    Minutes of Exercise per Session: 60 min  Stress: No Stress Concern Present (11/22/2022)   [employer] of Occupational Health - Occupational Stress Questionnaire    Feeling of Stress : Not at all  Social Connections: Moderately Isolated (11/22/2022)   Social Connection and Isolation Panel [NHANES]    Frequency of Communication with Friends and Family: More than three times a week    Frequency of Social Gatherings with Friends and Family: More than three times a week    Attends Religious Services: Never    Database administrator or Organizations: Yes    Attends Engineer, structural: More than 4 times per year    Marital Status: Widowed  Intimate Partner Violence: Not At Risk (02/22/2023)   Humiliation, Afraid, Rape, and Kick questionnaire    Fear of Current or Ex-Partner: No    Emotionally Abused: No    Physically Abused: No    Sexually Abused: No    ALLERGIES: Iodinated contrast media, Iohexol, Pravastatin, and Iodine  MEDICATIONS:  Current Outpatient Medications  Medication Sig Dispense Refill   acetaminophen (TYLENOL) 500 MG tablet Take 1,000 mg by mouth every 6 (six) hours as needed.     albuterol (VENTOLIN HFA) 108 (90 Base) MCG/ACT inhaler Inhale 2 puffs into the lungs every 6 (six) hours as needed for wheezing or shortness of  breath. 8 g 6   ALPRAZolam (XANAX) 0.25 MG tablet Take 1 tablet (  0.25 mg total) by mouth at bedtime as needed for anxiety. 30 tablet 0   Artificial Tear Solution (SOOTHE XP OP) Place 1 drop into both eyes 2 (two) times daily.     carvedilol (COREG) 25 MG tablet Take 1 tablet (25 mg total) by mouth 2 (two) times daily with a meal. 180 tablet 2   clopidogrel (PLAVIX) 75 MG tablet TAKE 1 TABLET BY MOUTH EVERY DAY 90 tablet 2   [employer] IRON 325 (65 Fe) MG tablet TAKE 1 TABLET BY MOUTH DAILY 90 tablet 1   diphenhydrAMINE (BENADRYL) 50 MG tablet Take 1 tablet (50 mg total) by mouth once for 1 dose. Pt to take 50 mg of benadryl on 10/20/21 at 12:00 PM (noon). Please call [phone] with any questions. 1 tablet 0   fluticasone (FLONASE) 50 MCG/ACT nasal spray Place 1 spray into both nostrils daily. (Patient taking differently: Place 1 spray into both nostrils daily as needed for allergies.) 16 g 3   levothyroxine (SYNTHROID) 175 MCG tablet TAKE 1 TABLET BY MOUTH EVERY DAY 90 tablet 3   methocarbamol (ROBAXIN) 500 MG tablet Take 500 mg by mouth every 6 (six) hours as needed for muscle spasms.     nitroGLYCERIN (NITROSTAT) 0.4 MG SL tablet Place 1 tablet (0.4 mg total) under the tongue every 5 (five) minutes as needed for chest pain. 25 tablet 2   pantoprazole (PROTONIX) 40 MG tablet TAKE 1 TABLET BY MOUTH DAILY BEFORE BREAKFAST 90 tablet 3   PRALUENT 150 MG/ML SOAJ INJECT 150 MG INTO THE SKIN EVERY 14 (FOURTEEN) DAYS. 6 mL 3   predniSONE (DELTASONE) 50 MG tablet Take one tablet 13 hours , one  tablet 7 hours and 1 tablet one hour prior to CT scan. Take benadryl 50 mg one hour prior to scan. Must have a driver. 3 tablet 5   tamsulosin (FLOMAX) 0.4 MG CAPS capsule Take 0.4 mg by mouth at bedtime.     No current facility-administered medications for this encounter.    REVIEW OF SYSTEMS:  On review of systems, the patient reports that he is doing well overall. He denies any chest pain, shortness of breath, cough,  fevers, chills, night sweats, unintended weight changes. He denies any bowel disturbances, and denies abdominal pain, nausea or vomiting. He denies any new musculoskeletal or joint aches or pains. His IPSS was 12, indicating moderate urinary symptoms. His SHIM was 18, indicating he has mild erectile dysfunction. A complete review of systems is obtained and is otherwise negative.    PHYSICAL EXAM:  Wt Readings from Last 3 Encounters:  02/22/23 235 lb (106.6 kg)  02/13/23 241 lb (109.3 kg)  01/17/23 241 lb (109.3 kg)   Temp Readings from Last 3 Encounters:  02/13/23 (!) 97.5 F (36.4 C)  10/04/22 97.7 F (36.5 C) (Temporal)  09/17/22 98.1 F (36.7 C) (Oral)   BP Readings from Last 3 Encounters:  02/13/23 91/67  01/17/23 124/74  10/04/22 125/78   Pulse Readings from Last 3 Encounters:  02/13/23 77  01/17/23 86  10/04/22 88   Pain Assessment Pain Score: 0-No pain/10  Physical exam not performed in light of telephone consult visit format.   KPS = 100  100 - Normal; no complaints; no evidence of disease. 90   - Able to carry on normal activity; minor signs or symptoms of disease. 80   - Normal activity with effort; some signs or symptoms of disease. 91   - Cares for self; unable to carry on normal  activity or to do active work. 60   - Requires occasional assistance, but is able to care for most of his personal needs. 50   - Requires considerable assistance and frequent medical care. 40   - Disabled; requires special care and assistance. 30   - Severely disabled; hospital admission is indicated although death not imminent. 20   - Very sick; hospital admission necessary; active supportive treatment necessary. 10   - Moribund; fatal processes progressing rapidly. 0     - Dead  Karnofsky DA, Abelmann WH, Craver LS and Burchenal [facility] [phone]) The use of the nitrogen mustards in the palliative treatment of carcinoma: with particular reference to bronchogenic carcinoma Cancer 1  [id number]  LABORATORY DATA:  Lab Results  Component Value Date   WBC 7.5 02/21/2023   HGB 13.3 02/21/2023   HCT 37.3 (L) 02/21/2023   MCV 97.6 02/21/2023   PLT 167 02/21/2023   Lab Results  Component Value Date   NA 140 02/21/2023   K 4.6 02/21/2023   CL 109 02/21/2023   CO2 25 02/21/2023   Lab Results  Component Value Date   ALT 10 02/21/2023   AST 13 (L) 02/21/2023   ALKPHOS 56 02/21/2023   BILITOT 0.7 02/21/2023     RADIOGRAPHY: CT Chest W Contrast  Result Date: 02/22/2023 CLINICAL DATA:  Non-small cell lung cancer staging; * Tracking Code: BO * EXAM: CT CHEST, ABDOMEN, AND PELVIS WITH CONTRAST TECHNIQUE: Multidetector CT imaging of the chest, abdomen and pelvis was performed following the standard protocol during bolus administration of intravenous contrast. RADIATION DOSE REDUCTION: This exam was performed according to the departmental dose-optimization program which includes automated exposure control, adjustment of the mA and/or kV according to patient size and/or use of iterative reconstruction technique. CONTRAST:  OMNIPAQUE IOHEXOL 300 MG/ML  SOLN COMPARISON:  CT chest, abdomen and pelvis dated August 21, 2022 FINDINGS: CT CHEST FINDINGS Cardiovascular: Normal heart size. Normal caliber thoracic aorta with moderate atherosclerotic disease. Severe coronary artery calcifications. Right chest wall port with tip in the right atrium. Mediastinum/Nodes: Esophagus unremarkable. Stable prominent left axillary lymph node measuring 9 mm on series 506, image 7. No enlarging lymph nodes seen in the chest. Lungs/Pleura: Central airways are patent. Stable right perihilar postradiation change. Left upper lobe postradiation change with slightly more linear configuration, consistent with expected evolution. No new or enlarging pulmonary nodules. No pleural effusion. Musculoskeletal: No chest wall mass or suspicious bone lesions identified. CT ABDOMEN PELVIS FINDINGS Hepatobiliary: No focal  liver abnormality is seen. No gallstones, gallbladder wall thickening, or biliary dilatation. Pancreas: Unremarkable. No pancreatic ductal dilatation or surrounding inflammatory changes. Spleen: Normal in size without focal abnormality. Adrenals/Urinary Tract: Bilateral adrenal glands are unremarkable. No hydronephrosis. Bilateral nonobstructing renal stones. No suspicious renal lesions. Stomach/Bowel: Stomach is within normal limits. Appendix appears normal. Diverticulosis. No evidence of bowel wall thickening, distention, or inflammatory changes. Vascular/Lymphatic: Aortic atherosclerosis. No enlarged abdominal or pelvic lymph nodes. Reproductive: Mild prostatomegaly. Other: No abdominal wall hernia or abnormality. No abdominopelvic ascites. Musculoskeletal: No acute or significant osseous findings. IMPRESSION: 1. Stable right perihilar postradiation change and expected evolution of left upper lobe postradiation change. No evidence of recurrent disease. 2. Stable prominent left axillary lymph node. 3. No evidence of metastatic disease in the abdomen or pelvis. 4. Bilateral nonobstructing renal stones. 5. Aortic Atherosclerosis (ICD10-I70.0). Electronically Signed   By: Allegra Lai M.D.   On: 02/22/2023 09:03   CT Abdomen Pelvis W Contrast  Result Date: 02/22/2023 CLINICAL  DATA:  Non-small cell lung cancer staging; * Tracking Code: BO * EXAM: CT CHEST, ABDOMEN, AND PELVIS WITH CONTRAST TECHNIQUE: Multidetector CT imaging of the chest, abdomen and pelvis was performed following the standard protocol during bolus administration of intravenous contrast. RADIATION DOSE REDUCTION: This exam was performed according to the departmental dose-optimization program which includes automated exposure control, adjustment of the mA and/or kV according to patient size and/or use of iterative reconstruction technique. CONTRAST:  OMNIPAQUE IOHEXOL 300 MG/ML  SOLN COMPARISON:  CT chest, abdomen and pelvis dated  August 21, 2022 FINDINGS: CT CHEST FINDINGS Cardiovascular: Normal heart size. Normal caliber thoracic aorta with moderate atherosclerotic disease. Severe coronary artery calcifications. Right chest wall port with tip in the right atrium. Mediastinum/Nodes: Esophagus unremarkable. Stable prominent left axillary lymph node measuring 9 mm on series 506, image 7. No enlarging lymph nodes seen in the chest. Lungs/Pleura: Central airways are patent. Stable right perihilar postradiation change. Left upper lobe postradiation change with slightly more linear configuration, consistent with expected evolution. No new or enlarging pulmonary nodules. No pleural effusion. Musculoskeletal: No chest wall mass or suspicious bone lesions identified. CT ABDOMEN PELVIS FINDINGS Hepatobiliary: No focal liver abnormality is seen. No gallstones, gallbladder wall thickening, or biliary dilatation. Pancreas: Unremarkable. No pancreatic ductal dilatation or surrounding inflammatory changes. Spleen: Normal in size without focal abnormality. Adrenals/Urinary Tract: Bilateral adrenal glands are unremarkable. No hydronephrosis. Bilateral nonobstructing renal stones. No suspicious renal lesions. Stomach/Bowel: Stomach is within normal limits. Appendix appears normal. Diverticulosis. No evidence of bowel wall thickening, distention, or inflammatory changes. Vascular/Lymphatic: Aortic atherosclerosis. No enlarged abdominal or pelvic lymph nodes. Reproductive: Mild prostatomegaly. Other: No abdominal wall hernia or abnormality. No abdominopelvic ascites. Musculoskeletal: No acute or significant osseous findings. IMPRESSION: 1. Stable right perihilar postradiation change and expected evolution of left upper lobe postradiation change. No evidence of recurrent disease. 2. Stable prominent left axillary lymph node. 3. No evidence of metastatic disease in the abdomen or pelvis. 4. Bilateral nonobstructing renal stones. 5. Aortic Atherosclerosis  (ICD10-I70.0). Electronically Signed   By: Allegra Lai M.D.   On: 02/22/2023 09:03   NM Bone Scan Whole Body  Result Date: 02/22/2023 CLINICAL DATA:  Non-small lung cancer EXAM: NUCLEAR MEDICINE WHOLE BODY BONE SCAN TECHNIQUE: Whole body anterior and posterior images were obtained approximately 3 hours after intravenous injection of radiopharmaceutical. RADIOPHARMACEUTICALS:  21.1 mCi Technetium-75m MDP IV COMPARISON:  CT same day FINDINGS: No foci of radiotracer activity within the axillary or appendicular skeleton to suggest metastatic disease. Uptake in the medial LEFT knee is favored degenerative. IMPRESSION: No evidence skeletal metastasis. Electronically Signed   By: Genevive Bi M.D.   On: 02/22/2023 08:51      IMPRESSION/PLAN: This visit was conducted via Telephone to spare the patient unnecessary potential exposure in the healthcare setting during the current COVID-19 pandemic. 1. 77 y.o. gentleman with Stage T1c adenocarcinoma of the prostate with Gleason Score of 4+3, and PSA of 5.43.   We discussed the patient's workup and outlined the nature of prostate cancer in this setting. The patient's T stage, Gleason's score, and PSA put him into the unfavorable intermediate risk group. Accordingly, he is eligible for a variety of potential treatment options including prostatectomy, brachytherapy, or 5.5 weeks of external radiation +/- ST-ADT. We discussed the available radiation techniques, and focused on the details and logistics and delivery. We discussed and outlined the risks, benefits, short and long-term effects associated with radiotherapy and compared and contrasted these with prostatectomy. We discussed the  role of SpaceOAR in reducing the rectal toxicity associated with radiotherapy.  We also discussed the role of ST-ADT in the management of unfavorable intermediate risk prostate cancer and the associated side effects that can be expected with this treatment.  We discussed study  results that show that adding short-term hormone therapy to standard treatment of the prostate  can extend the amount of time before disease progression/recurrence but does not appear to demonstrate an overall survival benefit.  Therefore, it is felt that the toxicities associated with ADT and the negative impact it has on quality of life for patient's with intermediate risk prostate cancer, may outweigh the overall small potential benefit of the treatment. Following a lengthy discussion of this topic, the patient prefers to avoid ADT at this time and only consider the use of ADT should his PSA continue to rise despite radiation alone.  He was encouraged to ask questions that were answered to his stated satisfaction.  At the end of the conversation the patient is interested in moving forward with 5.5 weeks of external beam therapy without ST-ADT. We will share our discussion with [name] and make arrangements for fiducial markers and SpaceOAR gel placement, first available, prior to simulation, to reduce rectal toxicity from radiotherapy. The patient appears to have a good understanding of his disease and our treatment recommendations which are of curative intent and is in agreement with the stated plan.  Therefore, we will move forward with treatment planning accordingly, in anticipation of beginning IMRT in the near future.  We enjoyed meeting him today and look forward to continuing to participate in his care.  Given current concerns for patient exposure during the COVID-19 pandemic, this encounter was conducted via telephone. The patient was notified in advance and was offered a MyChart meeting to allow for face to face communication but unfortunately reported that he did not have the appropriate resources/technology to support such a visit and instead preferred to proceed with telephone consult. The patient has given verbal consent for this type of encounter. The attendants for this meeting include  Margaretmary Dys MD, Ashlyn Bruning PA-C, and patient, Travis Padilla During the encounter, Margaretmary Dys MD and Marcello Fennel PA-C were located at [facility] Radiation Oncology Department.  Patient, Travis Padilla was located at home.  We personally spent 70 minutes in this encounter including chart review, reviewing radiological studies, meeting face-to-face with the patient, entering orders and completing documentation.   Marguarite Arbour, PA-C    Margaretmary Dys, MD  [facility]  Radiation Oncology Direct Dial: [phone]  Fax: [phone] [email or web address]  Skype  LinkedIn   This document serves as a record of services personally performed by Margaretmary Dys, MD and Marcello Fennel, PA-C. It was created on their behalf by Mickie Bail, a trained medical scribe. The creation of this record is based on the scribe's personal observations and the provider's statements to them. This document has been checked and approved by the attending provider.

## 2023-02-25 ENCOUNTER — Inpatient Hospital Stay (HOSPITAL_BASED_OUTPATIENT_CLINIC_OR_DEPARTMENT_OTHER): Payer: Medicare Other | Admitting: Internal Medicine

## 2023-02-25 ENCOUNTER — Other Ambulatory Visit: Payer: Self-pay

## 2023-02-25 VITALS — BP 111/93 | HR 80 | Temp 98.0°F | Resp 18 | Ht 74.0 in | Wt 240.6 lb

## 2023-02-25 DIAGNOSIS — E039 Hypothyroidism, unspecified: Secondary | ICD-10-CM | POA: Diagnosis not present

## 2023-02-25 DIAGNOSIS — C3490 Malignant neoplasm of unspecified part of unspecified bronchus or lung: Secondary | ICD-10-CM | POA: Diagnosis not present

## 2023-02-25 DIAGNOSIS — C61 Malignant neoplasm of prostate: Secondary | ICD-10-CM | POA: Diagnosis not present

## 2023-02-25 DIAGNOSIS — I2510 Atherosclerotic heart disease of native coronary artery without angina pectoris: Secondary | ICD-10-CM | POA: Diagnosis not present

## 2023-02-25 DIAGNOSIS — Z79890 Hormone replacement therapy (postmenopausal): Secondary | ICD-10-CM | POA: Diagnosis not present

## 2023-02-25 DIAGNOSIS — C3412 Malignant neoplasm of upper lobe, left bronchus or lung: Secondary | ICD-10-CM | POA: Diagnosis not present

## 2023-02-25 NOTE — Progress Notes (Signed)
[facility] Telephone:([phone]) [phone]   [phone]  OFFICE PROGRESS NOTE  [name], DO [address] [place] [address]  DIAGNOSIS:  1) Stage IV (T3, N0, M1c) non-small cell lung cancer, squamous cell carcinoma presented with large right infrahilar mass in addition to left upper lobe lung nodule as well as left axillary mass with left axillary lymph node diagnosed in August 2019. 2) prostate adenocarcinoma currently managed by [name] and Dr. Kathrynn Running  PRIOR THERAPY:  1) Palliative radiotherapy to the right infrahilar mass as well as the axillary mass under the care of Dr. Mitzi Hansen. 2) Systemic chemotherapy with carboplatin for AUC of 5, paclitaxel 175 mg/M2 and Keytruda 200 mg IV every 3 weeks status post 4 cycles. 3) Maintenance immunotherapy with single agent Keytruda 200 mg IV every 3 weeks status post 41 cycles.  His treatment is currently on hold secondary to intolerance.  CURRENT THERAPY: Observation.  INTERVAL HISTORY: Travis Padilla 77 y.o. male to the clinic today for follow-up visit.  The patient is feeling fine today with no concerning complaints.  He was recently diagnosed with prostate adenocarcinoma and currently undergoing evaluation by [name] as well as Dr. Kathrynn Running.  The patient has some reaction and numbness in his shins after the IV contrast for the scan recently.  He took his premedication for the scan but has some mild reaction after the scan.  He denied having any current chest pain, shortness of breath, cough or hemoptysis.  He has no nausea, vomiting, diarrhea or constipation.  He has no headache or visual changes.  He has no recent weight loss or night sweats.  He is here today for evaluation with repeat CT scan of the chest, abdomen and pelvis for restaging of his disease.  MEDICAL HISTORY: Past Medical History:  Diagnosis Date   Abnormal nuclear stress test    December, 2013   Anemia    Axillary adenopathy 04/24/2018   CAD  (coronary artery disease)    Mild nonobstructive plaque in cath 2013   Chest pain    December, 2013   Cough with hemoptysis 04/22/2018   Dizziness    Dyslipidemia 07/14/2019   Encounter for antineoplastic immunotherapy 05/13/2018   Gout    Hemorrhoid    Hyperlipidemia    Hypertension    Hypothyroidism (acquired) 08/11/2018   Metastasis to lung (HCC) 05/19/2018   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) (HCC) dx'd 04/17/18   to LN, infrahilar mass, lung nodule and lt axilla   Obesity, unspecified 03/28/2009   Qualifier: Diagnosis of  By: [name], MD, [name]     Palpitations 03/28/2009   Qualifier: Diagnosis of  By: [name], MD, [name]     Right lower lobe lung mass 04/22/2018   Skin cancer    Sleep apnea    SNORING 03/28/2009   Qualifier: Diagnosis of  By: [name], MD, [name]     Spinal stenosis    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    ALLERGIES:  is allergic to iodinated contrast media, iohexol, pravastatin, and iodine.  MEDICATIONS:  Current Outpatient Medications  Medication Sig Dispense Refill   acetaminophen (TYLENOL) 500 MG tablet Take 1,000 mg by mouth every 6 (six) hours as needed.     albuterol (VENTOLIN HFA) 108 (90 Base) MCG/ACT inhaler Inhale 2 puffs into the lungs every 6 (six) hours as needed for wheezing or shortness of breath. 8  g 6   ALPRAZolam (XANAX) 0.25 MG tablet Take 1 tablet (0.25 mg total) by mouth at bedtime as needed for anxiety. 30 tablet 0   Artificial Tear Solution (SOOTHE XP OP) Place 1 drop into both eyes 2 (two) times daily.     carvedilol (COREG) 25 MG tablet Take 1 tablet (25 mg total) by mouth 2 (two) times daily with a meal. 180 tablet 2   clopidogrel (PLAVIX) 75 MG tablet TAKE 1 TABLET BY MOUTH EVERY DAY 90 tablet 2   [employer] IRON 325 (65 Fe) MG tablet TAKE 1 TABLET BY MOUTH DAILY 90 tablet 1   diphenhydrAMINE (BENADRYL) 50 MG tablet Take 1 tablet (50 mg total) by mouth once for 1  dose. Pt to take 50 mg of benadryl on 10/20/21 at 12:00 PM (noon). Please call [phone] with any questions. 1 tablet 0   fluticasone (FLONASE) 50 MCG/ACT nasal spray Place 1 spray into both nostrils daily. (Patient taking differently: Place 1 spray into both nostrils daily as needed for allergies.) 16 g 3   levothyroxine (SYNTHROID) 175 MCG tablet TAKE 1 TABLET BY MOUTH EVERY DAY 90 tablet 3   methocarbamol (ROBAXIN) 500 MG tablet Take 500 mg by mouth every 6 (six) hours as needed for muscle spasms.     nitroGLYCERIN (NITROSTAT) 0.4 MG SL tablet Place 1 tablet (0.4 mg total) under the tongue every 5 (five) minutes as needed for chest pain. 25 tablet 2   pantoprazole (PROTONIX) 40 MG tablet TAKE 1 TABLET BY MOUTH DAILY BEFORE BREAKFAST 90 tablet 3   PRALUENT 150 MG/ML SOAJ INJECT 150 MG INTO THE SKIN EVERY 14 (FOURTEEN) DAYS. 6 mL 3   predniSONE (DELTASONE) 50 MG tablet Take one tablet 13 hours , one  tablet 7 hours and 1 tablet one hour prior to CT scan. Take benadryl 50 mg one hour prior to scan. Must have a driver. 3 tablet 5   tamsulosin (FLOMAX) 0.4 MG CAPS capsule Take 0.4 mg by mouth at bedtime.     No current facility-administered medications for this visit.    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   BELPHAROPTOSIS REPAIR Bilateral    CARDIAC CATHETERIZATION     CATARACT EXTRACTION W/ INTRAOCULAR LENS  IMPLANT, BILATERAL     COLONOSCOPY N/A 11/03/2014   Procedure: COLONOSCOPY;  Surgeon: Malissa Hippo, MD;  Location: [facility];  Service: Endoscopy;  Laterality: N/A;  1225   CORONARY ATHERECTOMY N/A 05/26/2021   Procedure: CORONARY ATHERECTOMY;  Surgeon: [name], MD;  Location: [facility] CV [facility];  Service: Cardiovascular;  Laterality: N/A;   CORONARY STENT INTERVENTION N/A 05/25/2021   Procedure: CORONARY STENT INTERVENTION;  Surgeon: [name], MD;  Location: [facility];  Service:  Cardiovascular;  Laterality: N/A;   CORONARY STENT INTERVENTION N/A 05/26/2021   Procedure: CORONARY STENT INTERVENTION;  Surgeon: [name], MD;  Location: [facility];  Service: Cardiovascular;  Laterality: N/A;   CORONARY ULTRASOUND/IVUS N/A 05/26/2021   Procedure: Intravascular Ultrasound/IVUS;  Surgeon: [name], MD;  Location: [facility];  Service: Cardiovascular;  Laterality: N/A;   ESOPHAGOGASTRODUODENOSCOPY (EGD) WITH PROPOFOL N/A 05/29/2021   Procedure: ESOPHAGOGASTRODUODENOSCOPY (EGD) WITH PROPOFOL;  Surgeon: [name], MD;  Location: [facility] ENDOSCOPY;  Service: Endoscopy;  Laterality: N/A;   IR CV LINE INJECTION  08/24/2020   IR FLUORO GUIDED NEEDLE PLC ASPIRATION/INJECTION LOC  11/28/2020  IR IMAGING GUIDED PORT INSERTION  07/11/2018   KNEE CARTILAGE SURGERY Left    Left knee   LEFT HEART CATH AND CORONARY ANGIOGRAPHY N/A 05/25/2021   Procedure: LEFT HEART CATH AND CORONARY ANGIOGRAPHY;  Surgeon: [name], MD;  [facility]: [facility];  Service: Cardiovascular;  Laterality: N/A;   PROSTATE BIOPSY     SKIN CANCER EXCISION  12/2014, 04/25/15   TEMPORARY PACEMAKER N/A 05/26/2021   Procedure: TEMPORARY PACEMAKER;  Surgeon: [name], MD;  Location: [facility];  Service: Cardiovascular;  Laterality: N/A;   VIDEO BRONCHOSCOPY Bilateral 05/09/2018   Procedure: VIDEO BRONCHOSCOPY WITH FLUORO;  Surgeon: [name], MD;  Location: [facility]OS[facility];  Service: Cardiopulmonary;  Laterality: Bilateral;    REVIEW OF SYSTEMS:  A comprehensive review of systems was negative except for: Constitutional: positive for fatigue Ears, nose, mouth, throat, and face: positive for sore and dry lips    PHYSICAL EXAMINATION: General appearance: alert, cooperative, fatigued, and no distress Head: Normocephalic, without obvious abnormality, atraumatic Neck: no adenopathy, no JVD, supple, symmetrical, trachea midline, and thyroid not enlarged,  symmetric, no tenderness/mass/nodules Lymph nodes: Cervical, supraclavicular, and axillary nodes normal. Resp: clear to auscultation bilaterally Back: symmetric, no curvature. ROM normal. No CVA tenderness. Cardio: regular rate and rhythm, S1, S2 normal, no murmur, click, rub or gallop GI: soft, non-tender; bowel sounds normal; no masses,  no organomegaly Extremities: extremities normal, atraumatic, no cyanosis or edema  ECOG PERFORMANCE STATUS: 1 - Symptomatic but completely ambulatory  Blood pressure (!) 111/93, pulse 80, temperature 98 F (36.7 C), temperature source Oral, resp. rate 18, height 6\' 2"  (1.88 m), weight 240 lb 9.6 oz (109.1 kg), SpO2 100 %.  LABORATORY DATA: Lab Results  Component Value Date   WBC 7.5 02/21/2023   HGB 13.3 02/21/2023   HCT 37.3 (L) 02/21/2023   MCV 97.6 02/21/2023   PLT 167 02/21/2023      Chemistry      Component Value Date/Time   NA 140 02/21/2023 0858   NA 141 04/18/2022 1116   K 4.6 02/21/2023 0858   CL 109 02/21/2023 0858   CO2 25 02/21/2023 0858   BUN 21 02/21/2023 0858   BUN 19 04/18/2022 1116   CREATININE 0.97 02/21/2023 0858      Component Value Date/Time   CALCIUM 9.4 02/21/2023 0858   ALKPHOS 56 02/21/2023 0858   AST 13 (L) 02/21/2023 0858   ALT 10 02/21/2023 0858   BILITOT 0.7 02/21/2023 0858       RADIOGRAPHIC STUDIES: CT Chest W Contrast  Result Date: 02/22/2023 CLINICAL DATA:  Non-small cell lung cancer staging; * Tracking Code: BO * EXAM: CT CHEST, ABDOMEN, AND PELVIS WITH CONTRAST TECHNIQUE: Multidetector CT imaging of the chest, abdomen and pelvis was performed following the standard protocol during bolus administration of intravenous contrast. RADIATION DOSE REDUCTION: This exam was performed according to the departmental dose-optimization program which includes automated exposure control, adjustment of the mA and/or kV according to patient size and/or use of iterative reconstruction technique. CONTRAST:   OMNIPAQUE IOHEXOL 300 MG/ML  SOLN COMPARISON:  CT chest, abdomen and pelvis dated August 21, 2022 FINDINGS: CT CHEST FINDINGS Cardiovascular: Normal heart size. Normal caliber thoracic aorta with moderate atherosclerotic disease. Severe coronary artery calcifications. Right chest wall port with tip in the right atrium. Mediastinum/Nodes: Esophagus unremarkable. Stable prominent left axillary lymph node measuring 9 mm on series 506, image 7. No enlarging lymph nodes seen in the chest. Lungs/Pleura: Central airways  are patent. Stable right perihilar postradiation change. Left upper lobe postradiation change with slightly more linear configuration, consistent with expected evolution. No new or enlarging pulmonary nodules. No pleural effusion. Musculoskeletal: No chest wall mass or suspicious bone lesions identified. CT ABDOMEN PELVIS FINDINGS Hepatobiliary: No focal liver abnormality is seen. No gallstones, gallbladder wall thickening, or biliary dilatation. Pancreas: Unremarkable. No pancreatic ductal dilatation or surrounding inflammatory changes. Spleen: Normal in size without focal abnormality. Adrenals/Urinary Tract: Bilateral adrenal glands are unremarkable. No hydronephrosis. Bilateral nonobstructing renal stones. No suspicious renal lesions. Stomach/Bowel: Stomach is within normal limits. Appendix appears normal. Diverticulosis. No evidence of bowel wall thickening, distention, or inflammatory changes. Vascular/Lymphatic: Aortic atherosclerosis. No enlarged abdominal or pelvic lymph nodes. Reproductive: Mild prostatomegaly. Other: No abdominal wall hernia or abnormality. No abdominopelvic ascites. Musculoskeletal: No acute or significant osseous findings. IMPRESSION: 1. Stable right perihilar postradiation change and expected evolution of left upper lobe postradiation change. No evidence of recurrent disease. 2. Stable prominent left axillary lymph node. 3. No evidence of metastatic disease in the abdomen or  pelvis. 4. Bilateral nonobstructing renal stones. 5. Aortic Atherosclerosis (ICD10-I70.0). Electronically Signed   By: Allegra Lai M.D.   On: 02/22/2023 09:03   CT Abdomen Pelvis W Contrast  Result Date: 02/22/2023 CLINICAL DATA:  Non-small cell lung cancer staging; * Tracking Code: BO * EXAM: CT CHEST, ABDOMEN, AND PELVIS WITH CONTRAST TECHNIQUE: Multidetector CT imaging of the chest, abdomen and pelvis was performed following the standard protocol during bolus administration of intravenous contrast. RADIATION DOSE REDUCTION: This exam was performed according to the departmental dose-optimization program which includes automated exposure control, adjustment of the mA and/or kV according to patient size and/or use of iterative reconstruction technique. CONTRAST:  OMNIPAQUE IOHEXOL 300 MG/ML  SOLN COMPARISON:  CT chest, abdomen and pelvis dated August 21, 2022 FINDINGS: CT CHEST FINDINGS Cardiovascular: Normal heart size. Normal caliber thoracic aorta with moderate atherosclerotic disease. Severe coronary artery calcifications. Right chest wall port with tip in the right atrium. Mediastinum/Nodes: Esophagus unremarkable. Stable prominent left axillary lymph node measuring 9 mm on series 506, image 7. No enlarging lymph nodes seen in the chest. Lungs/Pleura: Central airways are patent. Stable right perihilar postradiation change. Left upper lobe postradiation change with slightly more linear configuration, consistent with expected evolution. No new or enlarging pulmonary nodules. No pleural effusion. Musculoskeletal: No chest wall mass or suspicious bone lesions identified. CT ABDOMEN PELVIS FINDINGS Hepatobiliary: No focal liver abnormality is seen. No gallstones, gallbladder wall thickening, or biliary dilatation. Pancreas: Unremarkable. No pancreatic ductal dilatation or surrounding inflammatory changes. Spleen: Normal in size without focal abnormality. Adrenals/Urinary Tract: Bilateral adrenal  glands are unremarkable. No hydronephrosis. Bilateral nonobstructing renal stones. No suspicious renal lesions. Stomach/Bowel: Stomach is within normal limits. Appendix appears normal. Diverticulosis. No evidence of bowel wall thickening, distention, or inflammatory changes. Vascular/Lymphatic: Aortic atherosclerosis. No enlarged abdominal or pelvic lymph nodes. Reproductive: Mild prostatomegaly. Other: No abdominal wall hernia or abnormality. No abdominopelvic ascites. Musculoskeletal: No acute or significant osseous findings. IMPRESSION: 1. Stable right perihilar postradiation change and expected evolution of left upper lobe postradiation change. No evidence of recurrent disease. 2. Stable prominent left axillary lymph node. 3. No evidence of metastatic disease in the abdomen or pelvis. 4. Bilateral nonobstructing renal stones. 5. Aortic Atherosclerosis (ICD10-I70.0). Electronically Signed   By: Allegra Lai M.D.   On: 02/22/2023 09:03   NM Bone Scan Whole Body  Result Date: 02/22/2023 CLINICAL DATA:  Non-small lung cancer EXAM: NUCLEAR MEDICINE WHOLE BODY  BONE SCAN TECHNIQUE: Whole body anterior and posterior images were obtained approximately 3 hours after intravenous injection of radiopharmaceutical. RADIOPHARMACEUTICALS:  21.1 mCi Technetium-50m MDP IV COMPARISON:  CT same day FINDINGS: No foci of radiotracer activity within the axillary or appendicular skeleton to suggest metastatic disease. Uptake in the medial LEFT knee is favored degenerative. IMPRESSION: No evidence skeletal metastasis. Electronically Signed   By: Genevive Bi M.D.   On: 02/22/2023 08:51     ASSESSMENT AND PLAN: This is a very pleasant 77 years old white male with very light most smoking history recently diagnosed with stage IV (T3, N0, M1c) non-small cell lung cancer, squamous cell carcinoma based on the biopsy from the left axillary mass. He status post 4 cycles of induction systemic chemotherapy with carboplatin,  paclitaxel and Keytruda with partial response.  The patient is currently on maintenance treatment with single agent Keytruda status post 41 cycles.  He also received palliative radiotherapy to the enlarging left upper lobe lung mass under the care of Dr. Mitzi Hansen.  The patient has been on observation since that time and he is feeling fine with no concerning complaints except for mild fatigue. He had repeat CT scan of the chest, abdomen and pelvis performed recently.  I personally and independently reviewed the scan and discussed results with the patient today. His scan showed no concerning findings for disease progression. I recommended for him to continue on observation with repeat CT scan of the chest, abdomen and pelvis without contrast in 6 months. For the recently diagnosed prostate adenocarcinoma, he is followed by [name] and Dr. Kathrynn Running. I will arrange for the patient to have Port-A-Cath flush every 2 months. For the hypothyroidism he is currently on levothyroxine managed by his primary care physician. For the history of coronary artery disease as well as the aortic aneurysm, he is followed by Dr. [name]. The patient was advised to call immediately if he has any concerning symptoms in the interval. The patient voices understanding of current disease status and treatment options and is in agreement with the current care plan. All questions were answered. The patient knows to call the clinic with any problems, questions or concerns. We can certainly see the patient much sooner if necessary.  Disclaimer: This note was dictated with voice recognition software. Similar sounding words can inadvertently be transcribed and may not be corrected upon review.

## 2023-03-05 ENCOUNTER — Telehealth: Payer: Self-pay

## 2023-03-05 ENCOUNTER — Other Ambulatory Visit: Payer: Self-pay | Admitting: Urology

## 2023-03-05 ENCOUNTER — Telehealth: Payer: Self-pay | Admitting: Cardiology

## 2023-03-05 NOTE — Telephone Encounter (Signed)
   Name: Travis Padilla  DOB: 1945/11/12  MRN: [id number]  Primary Cardiologist: [name], MD   Preoperative team, please contact this patient and set up a phone call appointment for further preoperative risk assessment. Please obtain consent and complete medication review. Thank you for your help.  I confirm that guidance regarding antiplatelet and oral anticoagulation therapy has been completed and, if necessary, noted below.  He may hold Plavix for 5 days prior to procedure and should resume as soon as hemodynamically stable postoperatively.    Ronney Asters, NP 03/05/2023, 2:04 PM [facility]

## 2023-03-05 NOTE — Telephone Encounter (Signed)
Spoke with patient who is agreeable to do tele visit on 7/11 at 10 am. Med rec and consent done.

## 2023-03-05 NOTE — Telephone Encounter (Signed)
   Pre-operative Risk Assessment    Patient Name: Travis Padilla  DOB: 04/25/1946 MRN: [id number]      Request for Surgical Clearance    Procedure:   fiducial markers and space OAR   Date of Surgery:  Clearance 03/29/23                                 Surgeon:  Dr. Kathrynn Running Surgeon's Group or Practice Name:  [facility] Phone number:  [phone] Fax number:  [phone]   Type of Clearance Requested:   - Medical  - Pharmacy:  Hold Clopidogrel (Plavix) 5 days    Type of Anesthesia:  MAC   Additional requests/questions:      SignedFilomena Jungling   03/05/2023, 1:42 PM

## 2023-03-05 NOTE — Telephone Encounter (Signed)
  Patient Consent for Virtual Visit        Travis Padilla has provided verbal consent on 03/05/2023 for a virtual visit (video or telephone).   CONSENT FOR VIRTUAL VISIT FOR:  Travis Padilla  By participating in this virtual visit I agree to the following:  I hereby voluntarily request, consent and authorize [facility] and its employed or contracted physicians, physician assistants, nurse practitioners or other licensed health care professionals (the Practitioner), to provide me with telemedicine health care services (the "Services") as deemed necessary by the treating Practitioner. I acknowledge and consent to receive the Services by the Practitioner via telemedicine. I understand that the telemedicine visit will involve communicating with the Practitioner through live audiovisual communication technology and the disclosure of certain medical information by electronic transmission. I acknowledge that I have been given the opportunity to request an in-person assessment or other available alternative prior to the telemedicine visit and am voluntarily participating in the telemedicine visit.  I understand that I have the right to withhold or withdraw my consent to the use of telemedicine in the course of my care at any time, without affecting my right to future care or treatment, and that the Practitioner or I may terminate the telemedicine visit at any time. I understand that I have the right to inspect all information obtained and/or recorded in the course of the telemedicine visit and may receive copies of available information for a reasonable fee.  I understand that some of the potential risks of receiving the Services via telemedicine include:  Delay or interruption in medical evaluation due to technological equipment failure or disruption; Information transmitted may not be sufficient (e.g. poor resolution of images) to allow for appropriate medical decision making by the Practitioner;  and/or  In rare instances, security protocols could fail, causing a breach of personal health information.  Furthermore, I acknowledge that it is my responsibility to provide information about my medical history, conditions and care that is complete and accurate to the best of my ability. I acknowledge that Practitioner's advice, recommendations, and/or decision may be based on factors not within their control, such as incomplete or inaccurate data provided by me or distortions of diagnostic images or specimens that may result from electronic transmissions. I understand that the practice of medicine is not an exact science and that Practitioner makes no warranties or guarantees regarding treatment outcomes. I acknowledge that a copy of this consent can be made available to me via my patient portal [facility] MyChart), or I can request a printed copy by calling the office of [facility].    I understand that my insurance will be billed for this visit.   I have read or had this consent read to me. I understand the contents of this consent, which adequately explains the benefits and risks of the Services being provided via telemedicine.  I have been provided ample opportunity to ask questions regarding this consent and the Services and have had my questions answered to my satisfaction. I give my informed consent for the services to be provided through the use of telemedicine in my medical care

## 2023-03-07 ENCOUNTER — Encounter (HOSPITAL_BASED_OUTPATIENT_CLINIC_OR_DEPARTMENT_OTHER): Payer: Self-pay | Admitting: Urology

## 2023-03-08 NOTE — Progress Notes (Signed)
RN left message with patient to introduce myself and role.  Direct contact number provided for patient to call back.

## 2023-03-11 ENCOUNTER — Emergency Department (HOSPITAL_COMMUNITY): Payer: Medicare Other

## 2023-03-11 ENCOUNTER — Other Ambulatory Visit: Payer: Self-pay

## 2023-03-11 ENCOUNTER — Observation Stay (HOSPITAL_COMMUNITY)
Admission: EM | Admit: 2023-03-11 | Discharge: 2023-03-12 | Disposition: A | Discharge status: Home / Self Care | Payer: Medicare Other | Attending: Internal Medicine | Admitting: Internal Medicine

## 2023-03-11 DIAGNOSIS — I10 Essential (primary) hypertension: Secondary | ICD-10-CM | POA: Diagnosis not present

## 2023-03-11 DIAGNOSIS — K219 Gastro-esophageal reflux disease without esophagitis: Secondary | ICD-10-CM | POA: Diagnosis not present

## 2023-03-11 DIAGNOSIS — Z8546 Personal history of malignant neoplasm of prostate: Secondary | ICD-10-CM | POA: Diagnosis not present

## 2023-03-11 DIAGNOSIS — F411 Generalized anxiety disorder: Secondary | ICD-10-CM | POA: Diagnosis present

## 2023-03-11 DIAGNOSIS — R0789 Other chest pain: Principal | ICD-10-CM | POA: Diagnosis present

## 2023-03-11 DIAGNOSIS — C61 Malignant neoplasm of prostate: Secondary | ICD-10-CM | POA: Diagnosis not present

## 2023-03-11 DIAGNOSIS — R519 Headache, unspecified: Secondary | ICD-10-CM | POA: Diagnosis not present

## 2023-03-11 DIAGNOSIS — R079 Chest pain, unspecified: Secondary | ICD-10-CM

## 2023-03-11 DIAGNOSIS — Z7901 Long term (current) use of anticoagulants: Secondary | ICD-10-CM | POA: Diagnosis not present

## 2023-03-11 DIAGNOSIS — J9811 Atelectasis: Secondary | ICD-10-CM | POA: Diagnosis not present

## 2023-03-11 DIAGNOSIS — Z7902 Long term (current) use of antithrombotics/antiplatelets: Secondary | ICD-10-CM | POA: Insufficient documentation

## 2023-03-11 DIAGNOSIS — I6782 Cerebral ischemia: Secondary | ICD-10-CM | POA: Diagnosis not present

## 2023-03-11 DIAGNOSIS — I672 Cerebral atherosclerosis: Secondary | ICD-10-CM | POA: Diagnosis not present

## 2023-03-11 DIAGNOSIS — N179 Acute kidney failure, unspecified: Secondary | ICD-10-CM | POA: Diagnosis not present

## 2023-03-11 DIAGNOSIS — I2510 Atherosclerotic heart disease of native coronary artery without angina pectoris: Secondary | ICD-10-CM | POA: Diagnosis not present

## 2023-03-11 DIAGNOSIS — C3491 Malignant neoplasm of unspecified part of right bronchus or lung: Secondary | ICD-10-CM | POA: Diagnosis present

## 2023-03-11 DIAGNOSIS — J0100 Acute maxillary sinusitis, unspecified: Secondary | ICD-10-CM

## 2023-03-11 DIAGNOSIS — Z85038 Personal history of other malignant neoplasm of large intestine: Secondary | ICD-10-CM | POA: Diagnosis not present

## 2023-03-11 DIAGNOSIS — E039 Hypothyroidism, unspecified: Secondary | ICD-10-CM | POA: Diagnosis not present

## 2023-03-11 DIAGNOSIS — R9431 Abnormal electrocardiogram [ECG] [EKG]: Secondary | ICD-10-CM | POA: Diagnosis not present

## 2023-03-11 DIAGNOSIS — Z955 Presence of coronary angioplasty implant and graft: Secondary | ICD-10-CM | POA: Insufficient documentation

## 2023-03-11 DIAGNOSIS — Z79899 Other long term (current) drug therapy: Secondary | ICD-10-CM | POA: Diagnosis not present

## 2023-03-11 LAB — BASIC METABOLIC PANEL
Anion gap: 8 (ref 5–15)
BUN: 19 mg/dL (ref 8–23)
CO2: 24 mmol/L (ref 22–32)
Calcium: 9.1 mg/dL (ref 8.9–10.3)
Chloride: 105 mmol/L (ref 98–111)
Creatinine, Ser: 1.35 mg/dL — ABNORMAL HIGH (ref 0.61–1.24)
GFR, Estimated: 55 mL/min — ABNORMAL LOW (ref >60)
Glucose, Bld: 112 mg/dL — ABNORMAL HIGH (ref 70–99)
Potassium: 4.9 mmol/L (ref 3.5–5.1)
Sodium: 137 mmol/L (ref 135–145)

## 2023-03-11 LAB — CBC
HCT: 41.2 % (ref 39.0–52.0)
Hemoglobin: 13.9 g/dL (ref 13.0–17.0)
MCH: 33.7 pg (ref 26.0–34.0)
MCHC: 33.7 g/dL (ref 30.0–36.0)
MCV: 99.8 fL (ref 80.0–100.0)
Platelets: 162 10*3/uL (ref 150–400)
RBC: 4.13 MIL/uL — ABNORMAL LOW (ref 4.22–5.81)
RDW: 11.7 % (ref 11.5–15.5)
WBC: 6.2 10*3/uL (ref 4.0–10.5)
nRBC: 0 % (ref 0.0–0.2)

## 2023-03-11 LAB — MAGNESIUM: Magnesium: 1.9 mg/dL (ref 1.7–2.4)

## 2023-03-11 LAB — TROPONIN I (HIGH SENSITIVITY)
Troponin I (High Sensitivity): 6 ng/L (ref <18)
Troponin I (High Sensitivity): 7 ng/L (ref <18)

## 2023-03-11 LAB — D-DIMER, QUANTITATIVE: D-Dimer, Quant: 0.75 ug/mL-FEU — ABNORMAL HIGH (ref 0.00–0.50)

## 2023-03-11 MED ORDER — DIPHENHYDRAMINE HCL 25 MG PO CAPS: 50.0000 mg | ORAL_CAPSULE | Freq: Once | ORAL | Status: DC

## 2023-03-11 MED ORDER — ACETAMINOPHEN 500 MG PO TABS
1000.0000 mg | ORAL_TABLET | Freq: Once | ORAL | Status: AC
  Administered 2023-03-11: 1000 mg via ORAL
  Filled 2023-03-11: qty 2

## 2023-03-11 MED ORDER — SODIUM CHLORIDE 0.9 % IV SOLN
200.0000 mg | INTRAVENOUS | Status: DC
  Filled 2023-03-11: qty 200

## 2023-03-11 MED ORDER — NALOXONE HCL 0.4 MG/ML IJ SOLN: 0.4000 mg | INTRAMUSCULAR | Status: DC | PRN

## 2023-03-11 MED ORDER — ACETAMINOPHEN 650 MG RE SUPP: 650.0000 mg | Freq: Four times a day (QID) | RECTAL | Status: DC | PRN

## 2023-03-11 MED ORDER — LORAZEPAM 0.5 MG PO TABS
0.5000 mg | ORAL_TABLET | ORAL | Status: DC | PRN
  Administered 2023-03-11: 0.5 mg via ORAL
  Filled 2023-03-11: qty 1

## 2023-03-11 MED ORDER — LORAZEPAM 0.5 MG PO TABS
0.5000 mg | ORAL_TABLET | Freq: Once | ORAL | Status: AC
  Administered 2023-03-11: 0.5 mg via ORAL
  Filled 2023-03-11 (×2): qty 1

## 2023-03-11 MED ORDER — ONDANSETRON HCL 4 MG/2ML IJ SOLN: 4.0000 mg | Freq: Four times a day (QID) | INTRAMUSCULAR | Status: DC | PRN

## 2023-03-11 MED ORDER — MELATONIN 3 MG PO TABS
3.0000 mg | ORAL_TABLET | Freq: Every evening | ORAL | Status: DC | PRN
  Administered 2023-03-11: 3 mg via ORAL
  Filled 2023-03-11: qty 1

## 2023-03-11 MED ORDER — ACETAMINOPHEN 325 MG PO TABS
650.0000 mg | ORAL_TABLET | Freq: Four times a day (QID) | ORAL | Status: DC | PRN
  Administered 2023-03-12: 650 mg via ORAL
  Filled 2023-03-11: qty 2

## 2023-03-11 MED ORDER — FENTANYL CITRATE PF 50 MCG/ML IJ SOSY: 25.0000 ug | PREFILLED_SYRINGE | INTRAMUSCULAR | Status: DC | PRN

## 2023-03-11 NOTE — H&P (Signed)
History and Physical      Travis Padilla:[id number] DOB: 1945-09-21 DOA: 03/11/2023; DOS: 03/11/2023  PCP: [name], DO *** Patient coming from: home ***  I have personally briefly reviewed patient's old medical records in [facility]  Chief Complaint: ***  HPI: Travis Padilla is a 77 y.o. male with medical history significant for *** who is admitted to [facility] on 03/11/2023 with *** after presenting from home*** to [facility] ED complaining of ***.   ***        ***  ED Course:  Vital signs in the ED were notable for the following: ***  Labs were notable for the following: ***  Per my interpretation, EKG in ED demonstrated the following:  ***  Imaging in the ED, per corresponding formal radiology read, was notable for the following: ***  While in the ED, the following were administered: ***  Subsequently, the patient was admitted  ***  ***red   Review of Systems: As per HPI otherwise 10 point review of systems negative.   Past Medical History:  Diagnosis Date   Anemia    Axillary adenopathy 04/24/2018   CAD (coronary artery disease)    Mild nonobstructive plaque in cath 2013   Chest pain    December, 2013   Cough with hemoptysis 04/22/2018   Dizziness    Dyslipidemia 07/14/2019   Encounter for antineoplastic immunotherapy 05/13/2018   Gout    Hemorrhoid    Hyperlipidemia    Hypertension    Hypothyroidism (acquired) 08/11/2018   Metastasis to lung (HCC) 05/19/2018   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) (HCC) dx'd 04/17/18   to LN, infrahilar mass, lung nodule and lt axilla   Obesity, unspecified 03/28/2009   Qualifier: Diagnosis of  By: [name], MD, [name]     Palpitations 03/28/2009   Qualifier: Diagnosis of  By: [name], MD, [name]     Right lower lobe lung mass 04/22/2018   Skin cancer    Sleep apnea    Spinal stenosis    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     Past Surgical History:  Procedure Laterality Date   basal skin cancer N/A 2019   Nose   BELPHAROPTOSIS REPAIR Bilateral    CARDIAC CATHETERIZATION     CATARACT EXTRACTION W/ INTRAOCULAR LENS  IMPLANT, BILATERAL     COLONOSCOPY N/A 11/03/2014   Procedure: COLONOSCOPY;  Surgeon: Malissa Hippo, MD;  Location: [facility];  Service: Endoscopy;  Laterality: N/A;  1225   CORONARY ATHERECTOMY N/A 05/26/2021   Procedure: CORONARY ATHERECTOMY;  Surgeon: [name], MD;  Location: [facility] INVAS[facility] CV LAB;  Service: Cardiovascular;  Laterality: N/A;   CORONARY STENT INTERVENTION N/A 05/25/2021   Procedure: CORONARY STENT INTERVENTION;  Surgeon: [name], MD;  Location: [facility] INVAS[facility] CV LAB;  Service: Cardiovascular;  Laterality: N/A;   CORONARY STENT INTERVENTION N/A 05/26/2021   Procedure: CORONARY STENT INTERVENTION;  Surgeon: [name], MD;  Location: [facility] CV LAB;  Service: Cardiovascular;  Laterality: N/A;   CORONARY ULTRASOUND/IVUS N/A 05/26/2021   Procedure: Intravascular Ultrasound/IVUS;  Surgeon: [name], MD;  Location: [facility] CV [facility];  Service: Cardiovascular;  Laterality: N/A;   ESOPHAGOGASTRODUODENOSCOPY (EGD) WITH PROPOFOL N/A 05/29/2021   Procedure: ESOPHAGOGASTRODUODENOSCOPY (EGD) WITH PROPOFOL;  Surgeon: [name], MD;  Location: [facility] ENDOSCOPY;  Service: Endoscopy;  Laterality: N/A;   IR CV LINE INJECTION  08/24/2020  IR FLUORO GUIDED NEEDLE PLC ASPIRATION/INJECTION LOC  11/28/2020   IR IMAGING GUIDED PORT INSERTION  07/11/2018   KNEE CARTILAGE SURGERY Left    Left knee   LEFT HEART CATH AND CORONARY ANGIOGRAPHY N/A 05/25/2021   Procedure: LEFT HEART CATH AND CORONARY ANGIOGRAPHY;  Surgeon: [name], MD;  Location: [facility];  Service: Cardiovascular;  Laterality: N/A;   SKIN CANCER EXCISION  12/2014, 04/25/15   TEMPORARY PACEMAKER N/A 05/26/2021   Procedure: TEMPORARY PACEMAKER;  Surgeon: [name], MD;   Location: [facility];  Service: Cardiovascular;  Laterality: N/A;   VIDEO BRONCHOSCOPY Bilateral 05/09/2018   Procedure: VIDEO BRONCHOSCOPY WITH FLUORO;  Surgeon: [name], MD;  Location: [facility] ENDOSCOPY;  Service: Cardiopulmonary;  Laterality: Bilateral;    Social History:  reports that he quit smoking about 52 years ago. His smoking use included cigarettes. He started smoking about 54 years ago. He has a 0.40 pack-year smoking history. He has never used smokeless tobacco. He reports current alcohol use of about 2.0 standard drinks of alcohol per week. He reports that he does not use drugs.   Allergies  Allergen Reactions   Iodinated Contrast Media Hives and Rash    Pt presented to CT after completed 13 hr premedication regimen and still had an minor rash/hives.    Iohexol Rash    CT scan contrast caused rash and itching    does fine with premeds.   Pravastatin     Numbness in feet    Iodine Hives and Rash    CT scan contrast caused rash and itching     Family History  Problem Relation Age of Onset   Heart failure Mother    Parkinson's disease Mother    Heart attack Father    Healthy Sister    Skin cancer Sister    Healthy Sister    Healthy Sister    Neuropathy Brother    COPD Brother    Epilepsy Brother    Colon cancer Neg Hx    Pancreatic cancer Neg Hx    Prostate cancer Neg Hx    Rectal cancer Neg Hx    Stomach cancer Neg Hx     Family history reviewed and not pertinent ***   Prior to Admission medications   Medication Sig Start Date End Date Taking? Authorizing Provider  acetaminophen (TYLENOL) 500 MG tablet Take 1,000 mg by mouth every 6 (six) hours as needed.    [provider]  albuterol (VENTOLIN HFA) 108 (90 Base) MCG/ACT inhaler Inhale 2 puffs into the lungs every 6 (six) hours as needed for wheezing or shortness of breath. 04/18/22   [name], DO  ALPRAZolam [name]) 0.25 MG tablet Take 1 tablet (0.25 mg total) by mouth at  bedtime as needed for anxiety. 11/01/21   [name], DO  Artificial Tear Solution (SOOTHE XP OP) Place 1 drop into both eyes 2 (two) times daily.    [provider]  carvedilol (COREG) 25 MG tablet Take 1 tablet (25 mg total) by mouth 2 (two) times daily with a meal. 11/20/22   [name], MD  clopidogrel (PLAVIX) 75 MG tablet TAKE 1 TABLET BY MOUTH EVERY DAY 08/17/22   [name], MD  [employer] IRON 325 (65 Fe) MG tablet TAKE 1 TABLET BY MOUTH DAILY 08/17/21   Laverda Page B, NP  diphenhydrAMINE (BENADRYL) 50 MG tablet Take 1 tablet (50 mg total) by mouth once for 1 dose. Pt to take  50 mg of benadryl on 10/20/21 at 12:00 PM (noon). Please call [phone] with any questions. 10/12/21 11/30/21  Sterling Big, MD  fluticasone (FLONASE) 50 MCG/ACT nasal spray Place 1 spray into both nostrils daily. Patient taking differently: Place 1 spray into both nostrils daily as needed for allergies. 07/01/20   Coral Ceo, NP  levothyroxine (SYNTHROID) 175 MCG tablet TAKE 1 TABLET BY MOUTH EVERY DAY 09/17/22   Delynn Flavin M, DO  methocarbamol (ROBAXIN) 500 MG tablet Take 500 mg by mouth every 6 (six) hours as needed for muscle spasms. 08/11/19   [provider]  nitroGLYCERIN (NITROSTAT) 0.4 MG SL tablet Place 1 tablet (0.4 mg total) under the tongue every 5 (five) minutes as needed for chest pain. 11/30/21   [name], MD  pantoprazole (PROTONIX) 40 MG tablet TAKE 1 TABLET BY MOUTH DAILY BEFORE BREAKFAST 08/17/22   [name], MD  PRALUENT 150 MG/ML SOAJ INJECT 150 MG INTO THE SKIN EVERY 14 (FOURTEEN) DAYS. 04/02/22   [name], MD  predniSONE (DELTASONE) 50 MG tablet Take one tablet 13 hours , one  tablet 7 hours and 1 tablet one hour prior to CT scan. Take benadryl 50 mg one hour prior to scan. Must have a driver. 02/20/23   Si Gaul, MD  tamsulosin (FLOMAX) 0.4 MG CAPS capsule Take 0.4 mg by mouth at bedtime. 09/29/20   [provider]      Objective    Physical Exam: Vitals:   03/11/23 1737 03/11/23 1745 03/11/23 1815 03/11/23 1825  BP: 130/83 118/77  118/77  Pulse:   84 88  Resp:  14  16  Temp:      TempSrc:      SpO2:   98% 99%  Weight:      Height:        General: appears to be stated age; alert, oriented Skin: warm, dry, no rash Head:  AT/[place] Mouth:  Oral mucosa membranes appear moist, normal dentition Neck: supple; trachea midline Heart:  RRR; did not appreciate any M/R/G Lungs: CTAB, did not appreciate any wheezes, rales, or rhonchi Abdomen: + BS; soft, ND, NT Vascular: 2+ pedal pulses b/l; 2+ radial pulses b/l Extremities: no peripheral edema, no muscle wasting Neuro: strength and sensation intact in upper and lower extremities b/l    *** Neuro: 5/5 strength of the proximal and distal flexors and extensors of the upper and lower extremities bilaterally; sensation intact in upper and lower extremities b/l; cranial nerves II through XII grossly intact; no pronator drift; no evidence suggestive of slurred speech, dysarthria, or facial droop; Normal muscle tone. No tremors. *** Neuro: In the setting of the patient's current mental status and associated inability to follow instructions, unable to perform full neurologic exam at this time.  As such, assessment of strength, sensation, and cranial nerves is limited at this time. Patient noted to spontaneously move all 4 extremities. No tremors.  ***    Labs on Admission: I have personally reviewed following labs and imaging studies  CBC: Recent Labs  Lab 03/11/23 1742  WBC 6.2  HGB 13.9  HCT 41.2  MCV 99.8  PLT 162   Basic Metabolic Panel: Recent Labs  Lab 03/11/23 1742  NA 137  K 4.9  CL 105  CO2 24  GLUCOSE 112*  BUN 19  CREATININE 1.35*  CALCIUM 9.1   GFR: Estimated Creatinine Clearance: 62.1 mL/min (A) (by C-G formula based on SCr of 1.35 mg/dL (H)). Liver Function Tests: No results for  input(s): "AST", "ALT", "ALKPHOS",  "BILITOT", "PROT", "ALBUMIN" in the last 168 hours. No results for input(s): "LIPASE", "AMYLASE" in the last 168 hours. No results for input(s): "AMMONIA" in the last 168 hours. Coagulation Profile: No results for input(s): "INR", "PROTIME" in the last 168 hours. Cardiac Enzymes: No results for input(s): "CKTOTAL", "CKMB", "CKMBINDEX", "TROPONINI" in the last 168 hours. BNP (last 3 results) No results for input(s): "PROBNP" in the last 8760 hours. HbA1C: No results for input(s): "HGBA1C" in the last 72 hours. CBG: No results for input(s): "GLUCAP" in the last 168 hours. Lipid Profile: No results for input(s): "CHOL", "HDL", "LDLCALC", "TRIG", "CHOLHDL", "LDLDIRECT" in the last 72 hours. Thyroid Function Tests: No results for input(s): "TSH", "T4TOTAL", "FREET4", "T3FREE", "THYROIDAB" in the last 72 hours. Anemia Panel: No results for input(s): "VITAMINB12", "FOLATE", "FERRITIN", "TIBC", "IRON", "RETICCTPCT" in the last 72 hours. Urine analysis:    Component Value Date/Time   COLORURINE YELLOW 06/11/2021 2253   APPEARANCEUR HAZY (A) 06/11/2021 2253   LABSPEC 1.020 06/11/2021 2253   PHURINE 5.0 06/11/2021 2253   GLUCOSEU NEGATIVE 06/11/2021 2253   HGBUR NEGATIVE 06/11/2021 2253   BILIRUBINUR NEGATIVE 06/11/2021 2253   BILIRUBINUR negative 04/18/2015 0924   KETONESUR 20 (A) 06/11/2021 2253   PROTEINUR NEGATIVE 06/11/2021 2253   UROBILINOGEN negative 04/18/2015 0924   NITRITE NEGATIVE 06/11/2021 2253   LEUKOCYTESUR NEGATIVE 06/11/2021 2253    Radiological Exams on Admission: CT Head Wo Contrast  Result Date: 03/11/2023 CLINICAL DATA:  Headache EXAM: CT HEAD WITHOUT CONTRAST TECHNIQUE: Contiguous axial images were obtained from the base of the skull through the vertex without intravenous contrast. RADIATION DOSE REDUCTION: This exam was performed according to the departmental dose-optimization program which includes automated exposure control, adjustment of the mA and/or kV  according to patient size and/or use of iterative reconstruction technique. COMPARISON:  06/10/2021 FINDINGS: Brain: No evidence of acute infarction, hemorrhage, hydrocephalus, extra-axial collection or mass lesion/mass effect. Global cortical and central atrophy. Secondary ventricular prominence. Mild subcortical white matter and periventricular small vessel ischemic changes. Vascular: Mild intracranial atherosclerosis. Skull: Normal. Negative for fracture or focal lesion. Sinuses/Orbits: The visualized paranasal sinuses are essentially clear. The mastoid air cells are unopacified. Other: None. IMPRESSION: No acute intracranial abnormality. Atrophy with small vessel ischemic changes. Electronically Signed   By: Charline Bills M.D.   On: 03/11/2023 21:10   DG Chest 2 View  Result Date: 03/11/2023 CLINICAL DATA:  Chest pain EXAM: CHEST - 2 VIEW COMPARISON:  CT chest dated February 21, 2023 FINDINGS: The heart size and mediastinal contours are within normal limits. Right access MediPort with distal tip in the SVC, unchanged. Scarring/atelectasis in the right hilar region as well as in the left upper lobe, unchanged from prior examination. The visualized skeletal structures are unremarkable. IMPRESSION: Scarring/atelectasis in the right hilar region as well as in the left upper lobe, unchanged from prior examination. No acute cardiopulmonary process. Electronically Signed   By: Larose Hires D.O.   On: 03/11/2023 18:47      Assessment/Plan    Principal Problem:   Atypical chest  pain  ***              ***                ***               ***               ***               ***              ***               ***               ***               ***               ***               ***               ***              ***  DVT prophylaxis: SCD's ***  Code Status: Full code*** Family Communication: none*** Disposition Plan: Per Rounding Team Consults called: none***;  Admission status: ***    I SPENT GREATER THAN 75 *** MINUTES IN CLINICAL CARE TIME/MEDICAL DECISION-MAKING IN COMPLETING THIS ADMISSION.     Chaney Born Thelma Lorenzetti DO [facility] From 7PM - 7AM   03/11/2023, 9:15 PM   ***

## 2023-03-11 NOTE — ED Triage Notes (Signed)
Pt reports seeing urologist today for Eligard shot today to prep for treatment for prostate cancer. States since the shot, he is having chest tightness. States he feels uneasy. Hx of CAD and stents.

## 2023-03-11 NOTE — ED Provider Notes (Signed)
[place] [facility]MENT AT [facility] Provider Note   CSN: [id number] Arrival date & time: 03/11/23  1725     History  Chief Complaint  Patient presents with   Chest Pain    Travis Padilla is a 77 y.o. male with history of metastatic colon cancer, prostate cancer, hypothyroid, hypertension, gout, chest pain, CAD with stent placement on anticoagulation.  Patient presents to the ED for evaluation of chest tightness.  The patient reports that this morning he went to his urologist office where he received an injection for his prostate cancer.  The patient states that he received an Eligard shot in preparation for treatment of prostate cancer.  The patient states that this is the first time he has had this injection.  He states he received the injection and then about 2 hours after this occurred he developed a gradual onset chest tightness that was located centrally around his sternum.  He states that ever since the chest tightness began it is progressively worsened prompting him to seek emergency medical care after calling his urology office.  He denies any shortness of breath associated with this.  The patient is wheezing slightly on exam but states he constantly wheezes because he has lung cancer.  Patient reports that he recently was taken off of his blood thinning medication in preparation for prostate procedure as well as colonoscopy.  He denies any leg swelling, weakness but is endorsing lightheadedness described as floating.  He denies nausea, vomiting, abdominal pain, dysuria.  The patient reports that his chest tightness is made him very anxious.  He states that upon arrival to the ED his chest tightness and anxiety has slightly lessened.   Chest Pain Associated symptoms: no fever, no nausea and no vomiting        Home Medications Prior to Admission medications   Medication Sig Start Date End Date Taking? Authorizing Provider  acetaminophen (TYLENOL) 500 MG tablet  Take 1,000 mg by mouth every 6 (six) hours as needed.    [provider]  albuterol (VENTOLIN HFA) 108 (90 Base) MCG/ACT inhaler Inhale 2 puffs into the lungs every 6 (six) hours as needed for wheezing or shortness of breath. 04/18/22   [name], DO  ALPRAZolam [name]) 0.25 MG tablet Take 1 tablet (0.25 mg total) by mouth at bedtime as needed for anxiety. 11/01/21   [name], DO  Artificial Tear Solution (SOOTHE XP OP) Place 1 drop into both eyes 2 (two) times daily.    [provider]  carvedilol (COREG) 25 MG tablet Take 1 tablet (25 mg total) by mouth 2 (two) times daily with a meal. 11/20/22   [name], MD  clopidogrel (PLAVIX) 75 MG tablet TAKE 1 TABLET BY MOUTH EVERY DAY 08/17/22   [name], MD  [employer] IRON 325 (65 Fe) MG tablet TAKE 1 TABLET BY MOUTH DAILY 08/17/21   Laverda Page B, NP  diphenhydrAMINE (BENADRYL) 50 MG tablet Take 1 tablet (50 mg total) by mouth once for 1 dose. Pt to take 50 mg of benadryl on 10/20/21 at 12:00 PM (noon). Please call [phone] with any questions. 10/12/21 11/30/21  Sterling Big, MD  fluticasone (FLONASE) 50 MCG/ACT nasal spray Place 1 spray into both nostrils daily. Patient taking differently: Place 1 spray into both nostrils daily as needed for allergies. 07/01/20   Coral Ceo, NP  levothyroxine (SYNTHROID) 175 MCG tablet TAKE 1 TABLET BY MOUTH EVERY DAY 09/17/22   [name],  DO  methocarbamol (ROBAXIN) 500 MG tablet Take 500 mg by mouth every 6 (six) hours as needed for muscle spasms. 08/11/19   [provider]  nitroGLYCERIN (NITROSTAT) 0.4 MG SL tablet Place 1 tablet (0.4 mg total) under the tongue every 5 (five) minutes as needed for chest pain. 11/30/21   [name], MD  pantoprazole (PROTONIX) 40 MG tablet TAKE 1 TABLET BY MOUTH DAILY BEFORE BREAKFAST 08/17/22   [name], MD  PRALUENT 150 MG/ML SOAJ INJECT 150 MG INTO THE SKIN EVERY 14 (FOURTEEN) DAYS. 04/02/22   [name], MD  predniSONE (DELTASONE) 50 MG tablet Take one tablet 13 hours , one  tablet 7 hours and 1 tablet one hour prior to CT scan. Take benadryl 50 mg one hour prior to scan. Must have a driver. 02/20/23   Si Gaul, MD  tamsulosin (FLOMAX) 0.4 MG CAPS capsule Take 0.4 mg by mouth at bedtime. 09/29/20   [provider]      Allergies    Iodinated contrast media, Iohexol, Pravastatin, and Iodine    Review of Systems   Review of Systems  Constitutional:  Negative for fever.  Respiratory:  Positive for chest tightness.   Cardiovascular:  Positive for chest pain. Negative for leg swelling.  Gastrointestinal:  Negative for nausea and vomiting.  All other systems reviewed and are negative.   Physical Exam Updated Vital Signs BP 118/77   Pulse 88   Temp 98.3 F (36.8 C) (Oral)   Resp 16   Ht 6\' 2"  (1.88 m)   Wt 109 kg   SpO2 99%   BMI 30.85 kg/m  Physical Exam Vitals and nursing note reviewed.  Constitutional:      General: He is not in acute distress.    Appearance: Normal appearance. He is not ill-appearing, toxic-appearing or diaphoretic.  HENT:     Head: Normocephalic and atraumatic.     Nose: Nose normal.     Mouth/Throat:     Mouth: Mucous membranes are moist.     Pharynx: Oropharynx is clear.  Eyes:     Extraocular Movements: Extraocular movements intact.     Conjunctiva/sclera: Conjunctivae normal.     Pupils: Pupils are equal, round, and reactive to light.  Cardiovascular:     Rate and Rhythm: Normal rate and regular rhythm.  Pulmonary:     Effort: Pulmonary effort is normal.     Breath sounds: Normal breath sounds. No wheezing.  Abdominal:     General: Abdomen is flat. Bowel sounds are normal.     Palpations: Abdomen is soft.     Tenderness: There is no abdominal tenderness.  Musculoskeletal:     Cervical back: Normal range of motion and neck supple. No tenderness.     Right lower leg: No edema.     Left lower leg: No edema.  Skin:     General: Skin is warm and dry.     Capillary Refill: Capillary refill takes less than 2 seconds.  Neurological:     Mental Status: He is alert and oriented to person, place, and time.     ED Results / Procedures / Treatments   Labs (all labs ordered are listed, but only abnormal results are displayed) Labs Reviewed  BASIC METABOLIC PANEL - Abnormal; Notable for the following components:      Result Value   Glucose, Bld 112 (*)    Creatinine, Ser 1.35 (*)    GFR, Estimated 55 (*)    All other components  within normal limits  CBC - Abnormal; Notable for the following components:   RBC 4.13 (*)    All other components within normal limits  D-DIMER, QUANTITATIVE - Abnormal; Notable for the following components:   D-Dimer, Quant 0.75 (*)    All other components within normal limits  MAGNESIUM  CBC WITH DIFFERENTIAL/PLATELET  COMPREHENSIVE METABOLIC PANEL  MAGNESIUM  TROPONIN I (HIGH SENSITIVITY)  TROPONIN I (HIGH SENSITIVITY)    EKG EKG Interpretation Date/Time:  Monday March 11 2023 17:34:10 EDT Ventricular Rate:  92 PR Interval:  164 QRS Duration:  136 QT Interval:  373 QTC Calculation: 462 R Axis:   267  Text Interpretation: Sinus rhythm RBBB and LAFB Minimal ST elevation, lateral leads Confirmed by Rolan Bucco [phone]) on 03/11/2023 6:35:37 PM  Radiology CT Head Wo Contrast  Result Date: 03/11/2023 CLINICAL DATA:  Headache EXAM: CT HEAD WITHOUT CONTRAST TECHNIQUE: Contiguous axial images were obtained from the base of the skull through the vertex without intravenous contrast. RADIATION DOSE REDUCTION: This exam was performed according to the departmental dose-optimization program which includes automated exposure control, adjustment of the mA and/or kV according to patient size and/or use of iterative reconstruction technique. COMPARISON:  06/10/2021 FINDINGS: Brain: No evidence of acute infarction, hemorrhage, hydrocephalus, extra-axial collection or mass lesion/mass  effect. Global cortical and central atrophy. Secondary ventricular prominence. Mild subcortical white matter and periventricular small vessel ischemic changes. Vascular: Mild intracranial atherosclerosis. Skull: Normal. Negative for fracture or focal lesion. Sinuses/Orbits: The visualized paranasal sinuses are essentially clear. The mastoid air cells are unopacified. Other: None. IMPRESSION: No acute intracranial abnormality. Atrophy with small vessel ischemic changes. Electronically Signed   By: Charline Bills M.D.   On: 03/11/2023 21:10   DG Chest 2 View  Result Date: 03/11/2023 CLINICAL DATA:  Chest pain EXAM: CHEST - 2 VIEW COMPARISON:  CT chest dated February 21, 2023 FINDINGS: The heart size and mediastinal contours are within normal limits. Right access MediPort with distal tip in the SVC, unchanged. Scarring/atelectasis in the right hilar region as well as in the left upper lobe, unchanged from prior examination. The visualized skeletal structures are unremarkable. IMPRESSION: Scarring/atelectasis in the right hilar region as well as in the left upper lobe, unchanged from prior examination. No acute cardiopulmonary process. Electronically Signed   By: Larose Hires D.O.   On: 03/11/2023 18:47    Procedures Procedures   Medications Ordered in ED Medications  acetaminophen (TYLENOL) tablet 650 mg (has no administration in time range)    Or  acetaminophen (TYLENOL) suppository 650 mg (has no administration in time range)  melatonin tablet 3 mg (has no administration in time range)  ondansetron (ZOFRAN) injection 4 mg (has no administration in time range)  naloxone [facility]) injection 0.4 mg (has no administration in time range)  fentaNYL (SUBLIMAZE) injection 25 mcg (has no administration in time range)  LORazepam (ATIVAN) tablet 0.5 mg (has no administration in time range)  acetaminophen (TYLENOL) tablet 1,000 mg (1,000 mg Oral Given 03/11/23 2103)  LORazepam (ATIVAN) tablet 0.5 mg (0.5 mg Oral  Given 03/11/23 2103)    ED Course/ Medical Decision Making/ A&P     Medical Decision Making Amount and/or Complexity of Data Reviewed Labs: ordered. Radiology: ordered.  Risk OTC drugs. Prescription drug management. Decision regarding hospitalization.   77 year old male presents to the ED for evaluation.  Please see HPI for further details.  On examination patient is afebrile and nontachycardic.  His lung sounds are clear bilaterally and he is not  hypoxic.  Abdomen is soft and compressible throughout.  Neurological examination at baseline.  Patient reports ongoing and increasing headaches for the last 2 weeks so we will also add on CT head without contrast and give Tylenol for headache.  CBC without leukocytosis or anemia.  Metabolic panel without electrolyte derangement.  His creatinine is elevated 1.35, GFR decreased to 55, anion gap 8.  Patient troponin 6, 7 respectively.  Patient needs a 1.9.  EKG nonischemic.  Chest x-ray unremarkable.  D-dimer elevated 0.75.  CT head without contrast unremarkable.  Patient has contrast allergy.  The patient reports that in the past even when he has had a 13-hour premedication he still has significant reaction to contrast.  Patient reports last time he had contrast he had diffuse swelling to his lips.  Due to this, I do not feel comfortable free medicating patient with Solu-Medrol and Benadryl.  Will attempt admit the patient for a V/Q perfusion scan in the morning.  He does have a high heart score.  Discussed patient with Dr. Dimas Aguas, [facility] hospitalist, who has agreed to admit the patient for V/Q perfusion scan in the morning.  Patient amenable to plan.   Final Clinical Impression(s) / ED Diagnoses Final diagnoses:  Chest pain, unspecified type    Rx / DC Orders ED Discharge Orders     None         Clent Ridges 03/11/23 2135    Rolan Bucco, MD 03/11/23 2151

## 2023-03-12 ENCOUNTER — Encounter (HOSPITAL_COMMUNITY): Payer: Self-pay | Admitting: Internal Medicine

## 2023-03-12 ENCOUNTER — Encounter (HOSPITAL_BASED_OUTPATIENT_CLINIC_OR_DEPARTMENT_OTHER): Payer: Self-pay | Admitting: Anesthesiology

## 2023-03-12 ENCOUNTER — Observation Stay (HOSPITAL_COMMUNITY): Payer: Medicare Other

## 2023-03-12 DIAGNOSIS — R9439 Abnormal result of other cardiovascular function study: Secondary | ICD-10-CM | POA: Diagnosis not present

## 2023-03-12 DIAGNOSIS — F411 Generalized anxiety disorder: Secondary | ICD-10-CM | POA: Diagnosis present

## 2023-03-12 DIAGNOSIS — K219 Gastro-esophageal reflux disease without esophagitis: Secondary | ICD-10-CM | POA: Diagnosis present

## 2023-03-12 DIAGNOSIS — N179 Acute kidney failure, unspecified: Secondary | ICD-10-CM | POA: Diagnosis present

## 2023-03-12 DIAGNOSIS — R0789 Other chest pain: Secondary | ICD-10-CM | POA: Diagnosis not present

## 2023-03-12 LAB — COMPREHENSIVE METABOLIC PANEL
ALT: 16 U/L (ref 0–44)
AST: 18 U/L (ref 15–41)
Albumin: 3.8 g/dL (ref 3.5–5.0)
Alkaline Phosphatase: 51 U/L (ref 38–126)
Anion gap: 8 (ref 5–15)
BUN: 22 mg/dL (ref 8–23)
CO2: 25 mmol/L (ref 22–32)
Calcium: 8.9 mg/dL (ref 8.9–10.3)
Chloride: 105 mmol/L (ref 98–111)
Creatinine, Ser: 1.18 mg/dL (ref 0.61–1.24)
GFR, Estimated: >60 mL/min (ref >60)
Glucose, Bld: 115 mg/dL — ABNORMAL HIGH (ref 70–99)
Potassium: 4.2 mmol/L (ref 3.5–5.1)
Sodium: 138 mmol/L (ref 135–145)
Total Bilirubin: 0.8 mg/dL (ref 0.3–1.2)
Total Protein: 6.9 g/dL (ref 6.5–8.1)

## 2023-03-12 LAB — CBC WITH DIFFERENTIAL/PLATELET
Abs Immature Granulocytes: 0.02 10*3/uL (ref 0.00–0.07)
Basophils Absolute: 0 10*3/uL (ref 0.0–0.1)
Basophils Relative: 1 %
Eosinophils Absolute: 0.2 10*3/uL (ref 0.0–0.5)
Eosinophils Relative: 3 %
HCT: 39.1 % (ref 39.0–52.0)
Hemoglobin: 13.2 g/dL (ref 13.0–17.0)
Immature Granulocytes: 0 %
Lymphocytes Relative: 25 %
Lymphs Abs: 1.2 10*3/uL (ref 0.7–4.0)
MCH: 34 pg (ref 26.0–34.0)
MCHC: 33.8 g/dL (ref 30.0–36.0)
MCV: 100.8 fL — ABNORMAL HIGH (ref 80.0–100.0)
Monocytes Absolute: 1 10*3/uL (ref 0.1–1.0)
Monocytes Relative: 20 %
Neutro Abs: 2.5 10*3/uL (ref 1.7–7.7)
Neutrophils Relative %: 51 %
Platelets: 143 10*3/uL — ABNORMAL LOW (ref 150–400)
RBC: 3.88 MIL/uL — ABNORMAL LOW (ref 4.22–5.81)
RDW: 11.8 % (ref 11.5–15.5)
WBC: 4.8 10*3/uL (ref 4.0–10.5)
nRBC: 0 % (ref 0.0–0.2)

## 2023-03-12 LAB — LIPASE, BLOOD: Lipase: 33 U/L (ref 11–51)

## 2023-03-12 LAB — TROPONIN I (HIGH SENSITIVITY): Troponin I (High Sensitivity): 7 ng/L (ref <18)

## 2023-03-12 LAB — MAGNESIUM: Magnesium: 2.1 mg/dL (ref 1.7–2.4)

## 2023-03-12 MED ORDER — CARVEDILOL 25 MG PO TABS
25.0000 mg | ORAL_TABLET | Freq: Two times a day (BID) | ORAL | Status: DC
  Administered 2023-03-12: 25 mg via ORAL
  Filled 2023-03-12: qty 1

## 2023-03-12 MED ORDER — LACTATED RINGERS IV SOLN: INTRAVENOUS | Status: AC

## 2023-03-12 MED ORDER — TAMSULOSIN HCL 0.4 MG PO CAPS: 0.4000 mg | ORAL_CAPSULE | Freq: Every day | ORAL | Status: DC

## 2023-03-12 MED ORDER — FLUTICASONE PROPIONATE 50 MCG/ACT NA SUSP
1.0000 | Freq: Every day | NASAL | Status: DC | PRN
Start: 2023-03-12 — End: 2023-08-02

## 2023-03-12 MED ORDER — PANTOPRAZOLE SODIUM 40 MG PO TBEC
40.0000 mg | DELAYED_RELEASE_TABLET | Freq: Every day | ORAL | Status: DC
  Administered 2023-03-12: 40 mg via ORAL
  Filled 2023-03-12: qty 1

## 2023-03-12 MED ORDER — PANTOPRAZOLE SODIUM 40 MG IV SOLR: 40.0000 mg | INTRAVENOUS | Status: DC

## 2023-03-12 MED ORDER — LEVOTHYROXINE SODIUM 50 MCG PO TABS
175.0000 ug | ORAL_TABLET | Freq: Every day | ORAL | Status: DC
  Administered 2023-03-12: 175 ug via ORAL
  Filled 2023-03-12: qty 1

## 2023-03-12 MED ORDER — TECHNETIUM TO 99M ALBUMIN AGGREGATED
4.0000 | Freq: Once | INTRAVENOUS | Status: AC
  Administered 2023-03-12: 4 via INTRAVENOUS

## 2023-03-12 NOTE — Discharge Summary (Signed)
Physician Discharge Summary  Travis Padilla XBJ:[id number] DOB: 18-Oct-1945 DOA: 03/11/2023  PCP: [name], DO  Admit date: 03/11/2023 Discharge date: 03/12/2023  Admitted From: Home Disposition: Home  Recommendations for Outpatient Follow-up:  Follow up with PCP in 1 week with repeat CBC/BMP Outpatient follow-up with cardiology/oncology/urology Follow up in ED if symptoms worsen or new appear   Home Health: No Equipment/Devices: None  Discharge Condition: Stable CODE STATUS: Full Diet recommendation: Heart healthy  Brief/Interim Summary: 77 year old male with history of prostate cancer, stage IV squamous cell lung cancer, GAD, hypertension, hypothyroidism, GERD presented with chest pain.  On presentation, troponins x 2 were negative, D-dimer of 0.75.  Creatinine of 1.35.  EKG showed no ischemic changes.  During the hospitalization, his chest pain has resolved.  VQ scan was very low probability for pulmonary embolism.  He will be discharged home today with outpatient follow-up with PCP/cardiology/oncology and urology.  Discharge Diagnoses:   Chest pain -Atypical.  Resolved.  Troponin x 2 were negative on presentation. - EKG showed no ischemic changes.  During the hospitalization, his chest pain has resolved.  VQ scan was very low probability for pulmonary embolism.  He will be discharged home today with outpatient follow-up with PCP and cardiology  Acute kidney injury -Resolved with IV fluids.  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  Outpatient follow-up with PCP.  Hypothyroidism -Continue Synthroid  Hypertension -Continue Coreg  History of CAD -Continue Plavix, Coreg.  Outpatient follow-up with cardiology  Obesity -Outpatient follow-up  Discharge Instructions  Discharge Instructions     Ambulatory referral  to Cardiology   Complete by: As directed    Diet - low sodium heart healthy   Complete by: As directed    Increase activity slowly   Complete by: As directed       Allergies as of 03/12/2023       Reactions   Iodinated Contrast Media Hives, Rash   Pt presented to CT after completed 13 hr premedication regimen and still had an minor rash/hives.    Iohexol Rash   CT scan contrast caused rash and itching    does fine with premeds.   Pravastatin    Numbness in feet    Iodine Hives, Rash   CT scan contrast caused rash and itching         Medication List     STOP taking these medications    diphenhydrAMINE 50 MG tablet Commonly known as: BENADRYL       TAKE these medications    acetaminophen 500 MG tablet Commonly known as: TYLENOL Take 1,000 mg by mouth every 6 (six) hours as needed for moderate pain.   albuterol 108 (90 Base) MCG/ACT inhaler Commonly known as: VENTOLIN HFA Inhale 2 puffs into the lungs every 6 (six) hours as needed for wheezing or shortness of breath.   ALPRAZolam 0.25 MG tablet Commonly known as: XANAX Take 1 tablet (0.25 mg total) by mouth at bedtime as needed for anxiety.   carvedilol 25 MG tablet Commonly known as: COREG Take 1 tablet (25 mg total) by mouth 2 (two) times daily with a meal.   clopidogrel 75 MG tablet Commonly known as: PLAVIX TAKE 1 TABLET BY MOUTH EVERY DAY   [employer] Iron 325 (65 FE) MG tablet Generic drug: ferrous sulfate TAKE 1 TABLET BY MOUTH DAILY What changed:  how  much to take when to take this   fluticasone 50 MCG/ACT nasal spray Commonly known as: FLONASE Place 1 spray into both nostrils daily as needed for allergies.   levothyroxine 175 MCG tablet Commonly known as: SYNTHROID TAKE 1 TABLET BY MOUTH EVERY DAY   methocarbamol 500 MG tablet Commonly known as: ROBAXIN Take 500 mg by mouth every 6 (six) hours as needed for muscle spasms.   nitroGLYCERIN 0.4 MG SL tablet Commonly known as: NITROSTAT Place 1  tablet (0.4 mg total) under the tongue every 5 (five) minutes as needed for chest pain.   pantoprazole 40 MG tablet Commonly known as: PROTONIX TAKE 1 TABLET BY MOUTH DAILY BEFORE BREAKFAST   Praluent 150 MG/ML Soaj Generic drug: Alirocumab INJECT 150 MG INTO THE SKIN EVERY 14 (FOURTEEN) DAYS.   predniSONE 50 MG tablet Commonly known as: DELTASONE Take one tablet 13 hours , one  tablet 7 hours and 1 tablet one hour prior to CT scan. Take benadryl 50 mg one hour prior to scan. Must have a driver.   SOOTHE XP OP Place 1 drop into both eyes 2 (two) times daily.   tamsulosin 0.4 MG Caps capsule Commonly known as: FLOMAX Take 0.4 mg by mouth at bedtime.        Follow-up Information     Delynn Flavin M, DO. Schedule an appointment as soon as possible for a visit in 1 week(s).   Specialty: Family Medicine Contact information: [address] [place] [address] [phone]         Si Gaul, MD. Schedule an appointment as soon as possible for a visit in 1 week(s).   Specialty: Oncology Contact information: [address] [place] [address] [phone]                Allergies  Allergen Reactions   Iodinated Contrast Media Hives and Rash    Pt presented to CT after completed 13 hr premedication regimen and still had an minor rash/hives.    Iohexol Rash    CT scan contrast caused rash and itching    does fine with premeds.   Pravastatin     Numbness in feet    Iodine Hives and Rash    CT scan contrast caused rash and itching     Consultations: None   Procedures/Studies: NM Pulmonary Perfusion  Result Date: 03/12/2023 CLINICAL DATA:  PE suspected EXAM: NUCLEAR MEDICINE PERFUSION LUNG SCAN TECHNIQUE: Perfusion images were obtained in multiple projections after intravenous injection of radiopharmaceutical. Ventilation scans intentionally deferred if perfusion scan and chest x-ray adequate for interpretation during COVID 19 epidemic.  RADIOPHARMACEUTICALS:  4.2 mCi Tc-68m MAA IV COMPARISON:  Chest radiograph, 03/11/2023 FINDINGS: Expected bilateral perihilar perfusion defects related to post treatment/post radiation lung cancer. Otherwise homogeneous bilateral pulmonary perfusion without significant filling defect. IMPRESSION: 1. Very low probability examination for pulmonary embolism by modified perfusion only PIOPED criteria (PE absent). 2. Expected bilateral perihilar perfusion defects related to treated lung cancer. Electronically Signed   By: Jearld Lesch M.D.   On: 03/12/2023 13:51   CT Head Wo Contrast  Result Date: 03/11/2023 CLINICAL DATA:  Headache EXAM: CT HEAD WITHOUT CONTRAST TECHNIQUE: Contiguous axial images were obtained from the base of the skull through the vertex without intravenous contrast. RADIATION DOSE REDUCTION: This exam was performed according to the departmental dose-optimization program which includes automated exposure control, adjustment of the mA and/or kV according to patient size and/or use of iterative reconstruction technique. COMPARISON:  06/10/2021 FINDINGS:  Brain: No evidence of acute infarction, hemorrhage, hydrocephalus, extra-axial collection or mass lesion/mass effect. Global cortical and central atrophy. Secondary ventricular prominence. Mild subcortical white matter and periventricular small vessel ischemic changes. Vascular: Mild intracranial atherosclerosis. Skull: Normal. Negative for fracture or focal lesion. Sinuses/Orbits: The visualized paranasal sinuses are essentially clear. The mastoid air cells are unopacified. Other: None. IMPRESSION: No acute intracranial abnormality. Atrophy with small vessel ischemic changes. Electronically Signed   By: Charline Bills M.D.   On: 03/11/2023 21:10   DG Chest 2 View  Result Date: 03/11/2023 CLINICAL DATA:  Chest pain EXAM: CHEST - 2 VIEW COMPARISON:  CT chest dated February 21, 2023 FINDINGS: The heart size and mediastinal contours are within normal  limits. Right access MediPort with distal tip in the SVC, unchanged. Scarring/atelectasis in the right hilar region as well as in the left upper lobe, unchanged from prior examination. The visualized skeletal structures are unremarkable. IMPRESSION: Scarring/atelectasis in the right hilar region as well as in the left upper lobe, unchanged from prior examination. No acute cardiopulmonary process. Electronically Signed   By: Larose Hires D.O.   On: 03/11/2023 18:47   CT Chest W Contrast  Result Date: 02/22/2023 CLINICAL DATA:  Non-small cell lung cancer staging; * Tracking Code: BO * EXAM: CT CHEST, ABDOMEN, AND PELVIS WITH CONTRAST TECHNIQUE: Multidetector CT imaging of the chest, abdomen and pelvis was performed following the standard protocol during bolus administration of intravenous contrast. RADIATION DOSE REDUCTION: This exam was performed according to the departmental dose-optimization program which includes automated exposure control, adjustment of the mA and/or kV according to patient size and/or use of iterative reconstruction technique. CONTRAST:  OMNIPAQUE IOHEXOL 300 MG/ML  SOLN COMPARISON:  CT chest, abdomen and pelvis dated August 21, 2022 FINDINGS: CT CHEST FINDINGS Cardiovascular: Normal heart size. Normal caliber thoracic aorta with moderate atherosclerotic disease. Severe coronary artery calcifications. Right chest wall port with tip in the right atrium. Mediastinum/Nodes: Esophagus unremarkable. Stable prominent left axillary lymph node measuring 9 mm on series 506, image 7. No enlarging lymph nodes seen in the chest. Lungs/Pleura: Central airways are patent. Stable right perihilar postradiation change. Left upper lobe postradiation change with slightly more linear configuration, consistent with expected evolution. No new or enlarging pulmonary nodules. No pleural effusion. Musculoskeletal: No chest wall mass or suspicious bone lesions identified. CT ABDOMEN PELVIS FINDINGS  Hepatobiliary: No focal liver abnormality is seen. No gallstones, gallbladder wall thickening, or biliary dilatation. Pancreas: Unremarkable. No pancreatic ductal dilatation or surrounding inflammatory changes. Spleen: Normal in size without focal abnormality. Adrenals/Urinary Tract: Bilateral adrenal glands are unremarkable. No hydronephrosis. Bilateral nonobstructing renal stones. No suspicious renal lesions. Stomach/Bowel: Stomach is within normal limits. Appendix appears normal. Diverticulosis. No evidence of bowel wall thickening, distention, or inflammatory changes. Vascular/Lymphatic: Aortic atherosclerosis. No enlarged abdominal or pelvic lymph nodes. Reproductive: Mild prostatomegaly. Other: No abdominal wall hernia or abnormality. No abdominopelvic ascites. Musculoskeletal: No acute or significant osseous findings. IMPRESSION: 1. Stable right perihilar postradiation change and expected evolution of left upper lobe postradiation change. No evidence of recurrent disease. 2. Stable prominent left axillary lymph node. 3. No evidence of metastatic disease in the abdomen or pelvis. 4. Bilateral nonobstructing renal stones. 5. Aortic Atherosclerosis (ICD10-I70.0). Electronically Signed   By: Allegra Lai M.D.   On: 02/22/2023 09:03   CT Abdomen Pelvis W Contrast  Result Date: 02/22/2023 CLINICAL DATA:  Non-small cell lung cancer staging; * Tracking Code: BO * EXAM: CT CHEST, ABDOMEN, AND PELVIS WITH CONTRAST TECHNIQUE:  Multidetector CT imaging of the chest, abdomen and pelvis was performed following the standard protocol during bolus administration of intravenous contrast. RADIATION DOSE REDUCTION: This exam was performed according to the departmental dose-optimization program which includes automated exposure control, adjustment of the mA and/or kV according to patient size and/or use of iterative reconstruction technique. CONTRAST:  OMNIPAQUE IOHEXOL 300 MG/ML  SOLN COMPARISON:  CT chest, abdomen  and pelvis dated August 21, 2022 FINDINGS: CT CHEST FINDINGS Cardiovascular: Normal heart size. Normal caliber thoracic aorta with moderate atherosclerotic disease. Severe coronary artery calcifications. Right chest wall port with tip in the right atrium. Mediastinum/Nodes: Esophagus unremarkable. Stable prominent left axillary lymph node measuring 9 mm on series 506, image 7. No enlarging lymph nodes seen in the chest. Lungs/Pleura: Central airways are patent. Stable right perihilar postradiation change. Left upper lobe postradiation change with slightly more linear configuration, consistent with expected evolution. No new or enlarging pulmonary nodules. No pleural effusion. Musculoskeletal: No chest wall mass or suspicious bone lesions identified. CT ABDOMEN PELVIS FINDINGS Hepatobiliary: No focal liver abnormality is seen. No gallstones, gallbladder wall thickening, or biliary dilatation. Pancreas: Unremarkable. No pancreatic ductal dilatation or surrounding inflammatory changes. Spleen: Normal in size without focal abnormality. Adrenals/Urinary Tract: Bilateral adrenal glands are unremarkable. No hydronephrosis. Bilateral nonobstructing renal stones. No suspicious renal lesions. Stomach/Bowel: Stomach is within normal limits. Appendix appears normal. Diverticulosis. No evidence of bowel wall thickening, distention, or inflammatory changes. Vascular/Lymphatic: Aortic atherosclerosis. No enlarged abdominal or pelvic lymph nodes. Reproductive: Mild prostatomegaly. Other: No abdominal wall hernia or abnormality. No abdominopelvic ascites. Musculoskeletal: No acute or significant osseous findings. IMPRESSION: 1. Stable right perihilar postradiation change and expected evolution of left upper lobe postradiation change. No evidence of recurrent disease. 2. Stable prominent left axillary lymph node. 3. No evidence of metastatic disease in the abdomen or pelvis. 4. Bilateral nonobstructing renal stones. 5. Aortic  Atherosclerosis (ICD10-I70.0). Electronically Signed   By: Allegra Lai M.D.   On: 02/22/2023 09:03   NM Bone Scan Whole Body  Result Date: 02/22/2023 CLINICAL DATA:  Non-small lung cancer EXAM: NUCLEAR MEDICINE WHOLE BODY BONE SCAN TECHNIQUE: Whole body anterior and posterior images were obtained approximately 3 hours after intravenous injection of radiopharmaceutical. RADIOPHARMACEUTICALS:  21.1 mCi Technetium-87m MDP IV COMPARISON:  CT same day FINDINGS: No foci of radiotracer activity within the axillary or appendicular skeleton to suggest metastatic disease. Uptake in the medial LEFT knee is favored degenerative. IMPRESSION: No evidence skeletal metastasis. Electronically Signed   By: Genevive Bi M.D.   On: 02/22/2023 08:51      Subjective: Patient seen and examined at bedside.  Denies any current chest pain.  Feels okay to go home today.  No fever or vomiting reported.  Had some headache this morning.  Discharge Exam: Vitals:   03/12/23 0514 03/12/23 0951  BP: 128/75 132/83  Pulse: 74 81  Resp: 18 18  Temp: 98 F (36.7 C) 98.2 F (36.8 C)  SpO2: 98% 98%    General: Pt is alert, awake, not in acute distress.  On room air. Cardiovascular: rate controlled, S1/S2 + Respiratory: bilateral decreased breath sounds at bases with some scattered crackles Abdominal: Soft, obese, NT, ND, bowel sounds + Extremities: no edema, no cyanosis    The results of significant diagnostics from this hospitalization (including imaging, microbiology, ancillary and laboratory) are listed below for reference.     Microbiology: No results found for this or any previous visit (from the past 240 hour(s)).   Labs: BNP (  last 3 results) No results for input(s): "BNP" in the last 8760 hours. Basic Metabolic Panel: Recent Labs  Lab 03/11/23 1742 03/11/23 1918 03/12/23 0412  NA 137  --  138  K 4.9  --  4.2  CL 105  --  105  CO2 24  --  25  GLUCOSE 112*  --  115*  BUN 19  --  22   CREATININE 1.35*  --  1.18  CALCIUM 9.1  --  8.9  MG  --  1.9 2.1   Liver Function Tests: Recent Labs  Lab 03/12/23 0412  AST 18  ALT 16  ALKPHOS 51  BILITOT 0.8  PROT 6.9  ALBUMIN 3.8   Recent Labs  Lab 03/12/23 0412  LIPASE 33   No results for input(s): "AMMONIA" in the last 168 hours. CBC: Recent Labs  Lab 03/11/23 1742 03/12/23 0412  WBC 6.2 4.8  NEUTROABS  --  2.5  HGB 13.9 13.2  HCT 41.2 39.1  MCV 99.8 100.8*  PLT 162 143*   Cardiac Enzymes: No results for input(s): "CKTOTAL", "CKMB", "CKMBINDEX", "TROPONINI" in the last 168 hours. BNP: Invalid input(s): "POCBNP" CBG: No results for input(s): "GLUCAP" in the last 168 hours. D-Dimer Recent Labs    03/11/23 1833  DDIMER 0.75*   Hgb A1c No results for input(s): "HGBA1C" in the last 72 hours. Lipid Profile No results for input(s): "CHOL", "HDL", "LDLCALC", "TRIG", "CHOLHDL", "LDLDIRECT" in the last 72 hours. Thyroid function studies No results for input(s): "TSH", "T4TOTAL", "T3FREE", "THYROIDAB" in the last 72 hours.  Invalid input(s): "FREET3" Anemia work up No results for input(s): "VITAMINB12", "FOLATE", "FERRITIN", "TIBC", "IRON", "RETICCTPCT" in the last 72 hours. Urinalysis    Component Value Date/Time   COLORURINE YELLOW 06/11/2021 2253   APPEARANCEUR HAZY (A) 06/11/2021 2253   LABSPEC 1.020 06/11/2021 2253   PHURINE 5.0 06/11/2021 2253   GLUCOSEU NEGATIVE 06/11/2021 2253   HGBUR NEGATIVE 06/11/2021 2253   BILIRUBINUR NEGATIVE 06/11/2021 2253   BILIRUBINUR negative 04/18/2015 0924   KETONESUR 20 (A) 06/11/2021 2253   PROTEINUR NEGATIVE 06/11/2021 2253   UROBILINOGEN negative 04/18/2015 0924   NITRITE NEGATIVE 06/11/2021 2253   LEUKOCYTESUR NEGATIVE 06/11/2021 2253   Sepsis Labs Recent Labs  Lab 03/11/23 1742 03/12/23 0412  WBC 6.2 4.8   Microbiology No results found for this or any previous visit (from the past 240 hour(s)).   Time coordinating discharge: 35  minutes  SIGNED:   Glade Lloyd, MD  [facility] 03/12/2023, 2:46 PM

## 2023-03-13 NOTE — Progress Notes (Addendum)
Addendum:  Travis Padilla rescheduled from 03-29-2023 to 04-17-2023 due to global computer outrage.  Called and spoke w/ Travis Padilla via phone.  Travis Padilla verbalized understanding to arrive at 1030 npo after midnight with exception clear liquids until 0930, take same meds (listed below), do fleet enema night before, and last dose plavix 04-11-2023 (which had stated he did start back on 03-29-2023).    On 07/ 03 Chart review w/ anesthesia , Dr Renold Don MDA, prior to pre-op interview for candidate for [facility]. Travis Padilla has telephone cardiac clearance scheduled for 03-21-2023.  Dr Renold Don stated with cardiac clearance ok to proceed.  On 07/ 12-- Spoke w/ via phone for pre-op interview--- Travis Padilla Lab needs dos----  Mirant results------ current EKG in epic/ chart;  current cxr/ chest CT in epic COVID test -----patient states asymptomatic no test needed Arrive at ------- 0800 on 03-29-2023 NPO after MN NO Solid Food.  Clear liquids from MN until--- 0700 Med rec completed Medications to take morning of surgery ----- synthroid, protonix, coreg Diabetic medication ----- n/a Patient instructed no nail polish to be worn day of surgery Patient instructed to bring photo id and insurance card day of surgery Patient aware to have Driver (ride ) / caregiver    for 24 hours after surgery --son, nasir Patient Special Instructions ----- will do one fleet enema night before surgery Pre-Op special Instructions ----- Travis Padilla has office visit medical / plavix cardiac clearance by Edd Fabian NP on 03-21-2023 in epic/ chart Patient verbalized understanding of instructions that were given at this phone interview. Patient denies shortness of breath, chest pain, fever, cough at this phone interview.  Anesthesia Review:  HTN;  CAD s/p staged PCI , DES x1 LAD, x2 overlapping RCA 09/ 2022;  atypical chest pain;  mild OSA , intolerant to cpap;  COPD/ emphysema;  stage IV lung cancer w/ METS treated w/  pallitive radiation , systemic chemi, maintenance  immunotherapy discontinued due to intolerence 03/  Travis Padilla denies cardia s&s.  Stated has never taken a nitroglycerin.    PCP:  Dr A. Nadine Counts [facility] 03-19-2023) Cardiologist : Dr [name] (lov 03-21-2023) Pulmonology:  Dr Tonia Brooms [facility]on Arista 09-17-2022) Oncologist:  Dr Arbutus Ped Theron Arista 02-25-2023) Chest x-ray : 03-11-2023;  CT 02-21-2023 EKG : 03-21-2023 Echo : 01-10-2021 Stress test: 04-24-2021 Cardiac Cath :  05-26-2021 Activity level: denies sob w/ any activity Sleep Study/ CPAP : yes/ no   Blood Thinner/ Instructions Maurice Small Dose: Plavix ASA / Instructions/ Last Dose : no Travis Padilla stated was given instructions from cardiology to stop 5 days prior to surgery.  Stated last dosze will be Saturday 03/23/2023.

## 2023-03-13 NOTE — Telephone Encounter (Signed)
FYI - VV on 07/11 cancelled and changed into an OV with Edd Fabian on 07/11 at 10:05am. Patient needed hosp f/u

## 2023-03-13 NOTE — Telephone Encounter (Signed)
I will update all parties involved in regard to the change in the appt for pre op clearance.

## 2023-03-18 NOTE — Progress Notes (Unsigned)
Cardiology Clinic Note   Patient Name: Travis Padilla Date of Encounter: 03/18/2023  Primary Care Provider:  [name], DO Primary Cardiologist:  [name], MD  Patient Profile    Travis Padilla 77 year old male presents to the clinic today for follow-up evaluation of his coronary artery disease and preoperative cardiac evaluation.  Past Medical History    Past Medical History:  Diagnosis Date   Anemia    Axillary adenopathy 04/24/2018   CAD (coronary artery disease)    Mild nonobstructive plaque in cath 2013   Chest pain    December, 2013   Cough with hemoptysis 04/22/2018   Dizziness    Dyslipidemia 07/14/2019   Encounter for antineoplastic immunotherapy 05/13/2018   Gout    Hemorrhoid    Hyperlipidemia    Hypertension    Hypothyroidism (acquired) 08/11/2018   Metastasis to lung (HCC) 05/19/2018   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) (HCC) dx'd 04/17/18   to LN, infrahilar mass, lung nodule and lt axilla   Obesity, unspecified 03/28/2009   Qualifier: Diagnosis of  By: [name], MD, [name]     Palpitations 03/28/2009   Qualifier: Diagnosis of  By: [name], MD, [name]     Right lower lobe lung mass 04/22/2018   Skin cancer    Sleep apnea    Spinal stenosis    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   Past Surgical History:  Procedure Laterality Date   basal skin cancer N/A 2019   Nose   BELPHAROPTOSIS REPAIR Bilateral    CARDIAC CATHETERIZATION     CATARACT EXTRACTION W/ INTRAOCULAR LENS  IMPLANT, BILATERAL     COLONOSCOPY N/A 11/03/2014   Procedure: COLONOSCOPY;  Surgeon: Malissa Hippo, MD;  Location: [facility];  Service: Endoscopy;  Laterality: N/A;  1225   CORONARY ATHERECTOMY N/A 05/26/2021   Procedure: CORONARY ATHERECTOMY;  Surgeon: [name], MD;  Location: [facility];  Service: Cardiovascular;  Laterality: N/A;   CORONARY STENT INTERVENTION N/A 05/25/2021    Procedure: CORONARY STENT INTERVENTION;  Surgeon: [name], MD;  Location: [facility];  Service: Cardiovascular;  Laterality: N/A;   CORONARY STENT INTERVENTION N/A 05/26/2021   Procedure: CORONARY STENT INTERVENTION;  Surgeon: [name], MD;  Location: [facility];  Service: Cardiovascular;  Laterality: N/A;   CORONARY ULTRASOUND/IVUS N/A 05/26/2021   Procedure: Intravascular Ultrasound/IVUS;  Surgeon: [name], MD;  Location: [facility];  Service: Cardiovascular;  Laterality: N/A;   ESOPHAGOGASTRODUODENOSCOPY (EGD) WITH PROPOFOL N/A 05/29/2021   Procedure: ESOPHAGOGASTRODUODENOSCOPY (EGD) WITH PROPOFOL;  Surgeon: [name], MD;  Location: [facility];  Service: Endoscopy;  Laterality: N/A;   IR CV LINE INJECTION  08/24/2020   IR FLUORO GUIDED NEEDLE PLC ASPIRATION/INJECTION LOC  11/28/2020   IR IMAGING GUIDED PORT INSERTION  07/11/2018   KNEE CARTILAGE SURGERY Left    Left knee   LEFT HEART CATH AND CORONARY ANGIOGRAPHY N/A 05/25/2021   Procedure: LEFT HEART CATH AND CORONARY ANGIOGRAPHY;  Surgeon: [name], MD;  [facility]: [facility];  Service: Cardiovascular;  Laterality: N/A;   SKIN CANCER EXCISION  12/2014, 04/25/15   TEMPORARY PACEMAKER N/A 05/26/2021   Procedure: TEMPORARY PACEMAKER;  Surgeon: [name], MD;  Location: [facility];  Service: Cardiovascular;  Laterality: N/A;   VIDEO BRONCHOSCOPY Bilateral 05/09/2018   Procedure: VIDEO BRONCHOSCOPY WITH FLUORO;  Surgeon: Max Fickle  B, MD;  Location: [facility];  Service: Cardiopulmonary;  Laterality: Bilateral;    Allergies  Allergies  Allergen Reactions   Iodinated Contrast Media Hives and Rash    Pt presented to CT after completed 13 hr premedication regimen and still had an minor rash/hives.    Iohexol Rash    CT scan contrast caused rash and itching    does fine with premeds.   Pravastatin     Numbness in feet    Iodine Hives and Rash     CT scan contrast caused rash and itching     History of Present Illness    Travis Padilla has a PMH of coronary artery disease status post PCI with DES to his LAD, arthrectomy and staged DES to his mid RCA 9/22.  His PMH also includes history of GI bleed, ascending aortic dilation, HTN, HLD, hypothyroidism, lung cancer, COPD, and OSA.  He was seen in follow-up by Dr. [name] on 05/23/2022.  During that time he continues to do well from a cardiac standpoint.  He had been traveling and went to [place].  He reported having some leg discomfort while on his trip.  He denied trauma.  He reported a knot on his calf and had ecchymosis and Edema.  On exam no ecchymosis was evident.  He reported his pain was improving.  He did not seek medical help.  He denied shortness of breath.  He was able to hike without any chest discomfort arm or back discomfort.  He denied new shortness of breath palpitations, presyncope and syncope.  He was seen in the emergency department on 03/11/2023.  He reported chest discomfort.  His cardiac troponins were negative x 2.  D-dimer was 0.75.  Creatinine was 1.35.  His EKG did not show ischemic changes.  During his emergency department visit his chest discomfort resolved.  VQ scan showed very low probability for pulmonary embolus.  He presents to the clinic today for follow-up evaluation and states***.  *** denies chest pain, shortness of breath, lower extremity edema, fatigue, palpitations, melena, hematuria, hemoptysis, diaphoresis, weakness, presyncope, syncope, orthopnea, and PND.  Coronary artery disease-denies chest pain today.  Denies recent episodes of arm neck back or chest discomfort. Continue*** Heart healthy low-sodium diet-salty 6 given Increase physical activity as tolerated  Hyperlipidemia-LDL***. High-fiber diet Continue current medical therapy  Ascending aorta and aortic root dilation-denies chest and back discomfort.  Echocardiogram 01/10/2021 showed  aortic root and ascending aorta structurally normal with no evidence of dilation.  CT chest abdomen pelvis 08/21/2022 showed aortic atherosclerosis, evolving progressive radiation change in his left upper lobe, decreased size of left axillary enlarged node, no acute process or evidence of metastatic disease in abdomen or pelvis. Repeat echocardiogram when clinically indicated.  Preop-fiducial markers and space OAR, Dr. Kathrynn Running, [facility], [phone]      Primary Cardiologist: [name], MD  Chart reviewed as part of pre-operative protocol coverage. Given past medical history and time since last visit, based on ACC/AHA guidelines, SAMAY BURRITT would be at acceptable risk for the planned procedure without further cardiovascular testing.   His Plavix may be held for 5 days prior to his procedure.  Please resume as soon as hemostasis is achieved.  His RCRI is  class II risk, 0.9% risk of major cardiac event.  He is able to complete greater than 4 METS of physical activity.  Patient was advised that if he/she*** develops new symptoms prior to surgery to contact our office to arrange  a follow-up appointment.  He verbalized understanding.  I will route this recommendation to the requesting party via Epic fax function and remove from pre-op pool.       Home Medications    Prior to Admission medications   Medication Sig Start Date End Date Taking? Authorizing Provider  acetaminophen (TYLENOL) 500 MG tablet Take 1,000 mg by mouth every 6 (six) hours as needed for moderate pain.    [provider]  albuterol (VENTOLIN HFA) 108 (90 Base) MCG/ACT inhaler Inhale 2 puffs into the lungs every 6 (six) hours as needed for wheezing or shortness of breath. 04/18/22   [name], DO  ALPRAZolam [name]) 0.25 MG tablet Take 1 tablet (0.25 mg total) by mouth at bedtime as needed for anxiety. 11/01/21   [name], DO  Artificial Tear Solution (SOOTHE XP OP) Place 1 drop  into both eyes 2 (two) times daily.    [provider]  carvedilol (COREG) 25 MG tablet Take 1 tablet (25 mg total) by mouth 2 (two) times daily with a meal. 11/20/22   [name], MD  clopidogrel (PLAVIX) 75 MG tablet TAKE 1 TABLET BY MOUTH EVERY DAY 08/17/22   [name], MD  [facility] IRON 325 (65 Fe) MG tablet TAKE 1 TABLET BY MOUTH DAILY Patient taking differently: Take 325 mg by mouth daily with breakfast. 08/17/21   Arty Baumgartner, NP  fluticasone (FLONASE) 50 MCG/ACT nasal spray Place 1 spray into both nostrils daily as needed for allergies. 03/12/23   Glade Lloyd, MD  levothyroxine (SYNTHROID) 175 MCG tablet TAKE 1 TABLET BY MOUTH EVERY DAY 09/17/22   Delynn Flavin M, DO  methocarbamol (ROBAXIN) 500 MG tablet Take 500 mg by mouth every 6 (six) hours as needed for muscle spasms. 08/11/19   [provider]  nitroGLYCERIN (NITROSTAT) 0.4 MG SL tablet Place 1 tablet (0.4 mg total) under the tongue every 5 (five) minutes as needed for chest pain. 11/30/21   [name], MD  pantoprazole (PROTONIX) 40 MG tablet TAKE 1 TABLET BY MOUTH DAILY BEFORE BREAKFAST 08/17/22   [name], MD  PRALUENT 150 MG/ML SOAJ INJECT 150 MG INTO THE SKIN EVERY 14 (FOURTEEN) DAYS. 04/02/22   [name], MD  predniSONE (DELTASONE) 50 MG tablet Take one tablet 13 hours , one  tablet 7 hours and 1 tablet one hour prior to CT scan. Take benadryl 50 mg one hour prior to scan. Must have a driver. Patient not taking: Reported on 03/12/2023 02/20/23   Si Gaul, MD  tamsulosin (FLOMAX) 0.4 MG CAPS capsule Take 0.4 mg by mouth at bedtime. 09/29/20   [provider]    Family History    Family History  Problem Relation Age of Onset   Heart failure Mother    Parkinson's disease Mother    Heart attack Father    Healthy Sister    Skin cancer Sister    Healthy Sister    Healthy Sister    Neuropathy Brother    COPD Brother    Epilepsy Brother    Colon cancer Neg Hx     Pancreatic cancer Neg Hx    Prostate cancer Neg Hx    Rectal cancer Neg Hx    Stomach cancer Neg Hx    He indicated that his mother is deceased. He indicated that his father is deceased. He indicated that all of his three sisters are alive. He indicated that his brother is alive. He indicated that his maternal grandmother is  deceased. He indicated that his maternal grandfather is deceased. He indicated that his paternal grandmother is deceased. He indicated that his paternal grandfather is deceased. He indicated that the status of his neg hx is unknown.  Social History    Social History   Socioeconomic History   Marital status: Widowed    Spouse name: Not on file   Number of children: 1   Years of education: Not on file   Highest education level: Not on file  Occupational History   Occupation: Emergency planning/management officer  Tobacco Use   Smoking status: Former    Packs/day: 0.20    Years: 2.00    Additional pack years: 0.00    Total pack years: 0.40    Types: Cigarettes    Start date: 10/01/1968    Quit date: 1972    Years since quitting: 52.5   Smokeless tobacco: Never  Vaping Use   Vaping Use: Never used  Substance and Sexual Activity   Alcohol use: Yes    Alcohol/week: 2.0 standard drinks of alcohol    Types: 2 Glasses of wine per week    Comment: occ   Drug use: No   Sexual activity: Not Currently  Other Topics Concern   Not on file  Social History Narrative   Unable to ask intimate partner violence questions, wife present.    Pt's wife died in 2021/09/13.   Social Determinants of Health   Financial Resource Strain: Low Risk  (11/22/2022)   Overall Financial Resource Strain (CARDIA)    Difficulty of Paying Living Expenses: Not hard at all  Food Insecurity: No Food Insecurity (03/11/2023)   Hunger Vital Sign    Worried About Running Out of Food in the Last Year: Never true    Ran Out of Food in the Last Year: Never true  Transportation Needs: No Transportation Needs  (03/11/2023)   PRAPARE - Administrator, Civil Service (Medical): No    Lack of Transportation (Non-Medical): No  Physical Activity: Sufficiently Active (11/22/2022)   Exercise Vital Sign    Days of Exercise per Week: 4 days    Minutes of Exercise per Session: 60 min  Stress: No Stress Concern Present (11/22/2022)   [employer] of Occupational Health - Occupational Stress Questionnaire    Feeling of Stress : Not at all  Social Connections: Moderately Isolated (11/22/2022)   Social Connection and Isolation Panel [NHANES]    Frequency of Communication with Friends and Family: More than three times a week    Frequency of Social Gatherings with Friends and Family: More than three times a week    Attends Religious Services: Never    Database administrator or Organizations: Yes    Attends Engineer, structural: More than 4 times per year    Marital Status: Widowed  Intimate Partner Violence: Not At Risk (03/11/2023)   Humiliation, Afraid, Rape, and Kick questionnaire    Fear of Current or Ex-Partner: No    Emotionally Abused: No    Physically Abused: No    Sexually Abused: No     Review of Systems    General:  No chills, fever, night sweats or weight changes.  Cardiovascular:  No chest pain, dyspnea on exertion, edema, orthopnea, palpitations, paroxysmal nocturnal dyspnea. Dermatological: No rash, lesions/masses Respiratory: No cough, dyspnea Urologic: No hematuria, dysuria Abdominal:   No nausea, vomiting, diarrhea, bright red blood per rectum, melena, or hematemesis Neurologic:  No visual changes, wkns, changes in mental status.  All other systems reviewed and are otherwise negative except as noted above.  Physical Exam    VS:  There were no vitals taken for this visit. , BMI There is no height or weight on file to calculate BMI. GEN: Well nourished, well developed, in no acute distress. HEENT: normal. Neck: Supple, no JVD, carotid bruits, or masses. Cardiac:  RRR, no murmurs, rubs, or gallops. No clubbing, cyanosis, edema.  Radials/DP/PT 2+ and equal bilaterally.  Respiratory:  Respirations regular and unlabored, clear to auscultation bilaterally. GI: Soft, nontender, nondistended, BS + x 4. MS: no deformity or atrophy. Skin: warm and dry, no rash. Neuro:  Strength and sensation are intact. Psych: Normal affect.  Accessory Clinical Findings    Recent Labs: 08/20/2022: TSH 1.520 03/12/2023: ALT 16; BUN 22; Creatinine, Ser 1.18; Hemoglobin 13.2; Magnesium 2.1; Platelets 143; Potassium 4.2; Sodium 138   Recent Lipid Panel    Component Value Date/Time   CHOL 123 05/24/2021 1151   TRIG 191 (H) 05/24/2021 1151   HDL 38 (L) 05/24/2021 1151   CHOLHDL 3.2 05/24/2021 1151   LDLCALC 54 05/24/2021 1151    No BP recorded.  {Refresh Note OR Click here to enter BP  :1}***    ECG personally reviewed by me today- ***    - No acute changes   Echocardiogram 01/10/2021  IMPRESSIONS     1. Left ventricular ejection fraction, by estimation, is 50 to 55%. The  left ventricle has low normal function. The left ventricle has no regional  wall motion abnormalities. There is mild concentric left ventricular  hypertrophy. Indeterminate diastolic  filling due to E-A fusion.   2. Right ventricular systolic function is normal. The right ventricular  size is normal. Tricuspid regurgitation signal is inadequate for assessing  PA pressure.   3. The mitral valve is grossly normal. No evidence of mitral valve  regurgitation. No evidence of mitral stenosis.   4. The aortic valve is tricuspid. Aortic valve regurgitation is trivial.  No aortic stenosis is present.   5. The inferior vena cava is normal in size with greater than 50%  respiratory variability, suggesting right atrial pressure of 3 mmHg.   Comparison(s): No significant change from prior study.   FINDINGS   Left Ventricle: Left ventricular ejection fraction, by estimation, is 50  to 55%. The left  ventricle has low normal function. The left ventricle has  no regional wall motion abnormalities. Definity contrast agent was given  IV to delineate the left  ventricular endocardial borders. The left ventricular internal cavity size  was normal in size. There is mild concentric left ventricular hypertrophy.  Indeterminate diastolic filling due to E-A fusion.   Right Ventricle: The right ventricular size is normal. No increase in  right ventricular wall thickness. Right ventricular systolic function is  normal. Tricuspid regurgitation signal is inadequate for assessing PA  pressure.   Left Atrium: Left atrial size was normal in size.   Right Atrium: Right atrial size was normal in size.   Pericardium: Trivial pericardial effusion is present.   Mitral Valve: The mitral valve is grossly normal. No evidence of mitral  valve regurgitation. No evidence of mitral valve stenosis.   Tricuspid Valve: The tricuspid valve is grossly normal. Tricuspid valve  regurgitation is trivial. No evidence of tricuspid stenosis.   Aortic Valve: The aortic valve is tricuspid. Aortic valve regurgitation is  trivial. No aortic stenosis is present.   Pulmonic Valve: The pulmonic valve was grossly normal. Pulmonic valve  regurgitation  is not visualized. No evidence of pulmonic stenosis.   Aorta: The aortic root and ascending aorta are structurally normal, with  no evidence of dilitation.   Venous: The inferior vena cava is normal in size with greater than 50%  respiratory variability, suggesting right atrial pressure of 3 mmHg.   IAS/Shunts: The atrial septum is grossly normal.    Assessment & Plan   1.  ***   Thomasene Ripple. Raheim Beutler NP-C     03/18/2023, 10:42 AM [facility] [address] Office [phone] Fax [phone]    I spent***minutes examining this patient, reviewing medications, and using patient centered shared decision making involving her  cardiac care.  Prior to her visit I spent greater than 20 minutes reviewing her past medical history,  medications, and prior cardiac tests.

## 2023-03-19 ENCOUNTER — Encounter: Payer: Self-pay | Admitting: Internal Medicine

## 2023-03-19 ENCOUNTER — Encounter: Payer: Self-pay | Admitting: Family Medicine

## 2023-03-19 ENCOUNTER — Ambulatory Visit (INDEPENDENT_AMBULATORY_CARE_PROVIDER_SITE_OTHER): Payer: Medicare Other | Admitting: Family Medicine

## 2023-03-19 VITALS — BP 118/73 | HR 77 | Temp 98.7°F | Ht 74.0 in | Wt 242.0 lb

## 2023-03-19 DIAGNOSIS — R079 Chest pain, unspecified: Secondary | ICD-10-CM | POA: Diagnosis not present

## 2023-03-19 DIAGNOSIS — R197 Diarrhea, unspecified: Secondary | ICD-10-CM | POA: Diagnosis not present

## 2023-03-19 DIAGNOSIS — F418 Other specified anxiety disorders: Secondary | ICD-10-CM

## 2023-03-19 DIAGNOSIS — Z79899 Other long term (current) drug therapy: Secondary | ICD-10-CM

## 2023-03-19 DIAGNOSIS — R4589 Other symptoms and signs involving emotional state: Secondary | ICD-10-CM | POA: Diagnosis not present

## 2023-03-19 DIAGNOSIS — E039 Hypothyroidism, unspecified: Secondary | ICD-10-CM

## 2023-03-19 DIAGNOSIS — Z09 Encounter for follow-up examination after completed treatment for conditions other than malignant neoplasm: Secondary | ICD-10-CM | POA: Diagnosis not present

## 2023-03-19 MED ORDER — ALPRAZOLAM 0.25 MG PO TABS
0.1250 mg | ORAL_TABLET | Freq: Every evening | ORAL | 0 refills | Status: DC | PRN
Start: 2023-03-19 — End: 2023-08-19

## 2023-03-19 NOTE — Patient Instructions (Signed)
Low-FODMAP Eating Plan  FODMAP stands for fermentable oligosaccharides, disaccharides, monosaccharides, and polyols. These are sugars that are hard for some people to digest. A low-FODMAP eating plan may help some people who have irritable bowel syndrome (IBS) and certain other bowel (intestinal) diseases to manage their symptoms. This meal plan can be complicated to follow. Work with a diet and nutrition specialist (dietitian) to make a low-FODMAP eating plan that is right for you. A dietitian can help make sure that you get enough nutrition from this diet. What are tips for following this plan? Reading food labels Check labels for hidden FODMAPs such as: High-fructose syrup. Honey. Agave. Natural fruit flavors. Onion or garlic powder. Choose low-FODMAP foods that contain 3-4 grams of fiber per serving. Check food labels for serving sizes. Eat only one serving at a time to make sure FODMAP levels stay low. Shopping Shop with a list of foods that are recommended on this diet and make a meal plan. Meal planning Follow a low-FODMAP eating plan for up to 6 weeks, or as told by your health care provider or dietitian. To follow the eating plan: Eliminate high-FODMAP foods from your diet completely. Choose only low-FODMAP foods to eat. You will do this for 2-6 weeks. Gradually reintroduce high-FODMAP foods into your diet one at a time. Most people should wait a few days before introducing the next new high-FODMAP food into their meal plan. Your dietitian can recommend how quickly you may reintroduce foods. Keep a daily record of what and how much you eat and drink. Make note of any symptoms that you have after eating. Review your daily record with a dietitian regularly to identify which foods you can eat and which foods you should avoid. General tips Drink enough fluid each day to keep your urine pale yellow. Avoid processed foods. These often have added sugar and may be high in FODMAPs. Avoid  most dairy products, whole grains, and sweeteners. Work with a dietitian to make sure you get enough fiber in your diet. Avoid high FODMAP foods at meals to manage symptoms. Recommended foods Fruits Bananas, oranges, tangerines, lemons, limes, blueberries, raspberries, strawberries, grapes, cantaloupe, honeydew melon, kiwi, papaya, passion fruit, and pineapple. Limited amounts of dried cranberries, banana chips, and shredded coconut. Vegetables Eggplant, zucchini, cucumber, peppers, green beans, bean sprouts, lettuce, arugula, kale, Swiss chard, spinach, collard greens, bok choy, summer squash, potato, and tomato. Limited amounts of corn, carrot, and sweet potato. Green parts of scallions. Grains Gluten-free grains, such as rice, oats, buckwheat, quinoa, corn, polenta, and millet. Gluten-free pasta, bread, or cereal. Rice noodles. Corn tortillas. Meats and other proteins Unseasoned beef, pork, poultry, or fish. Eggs. Bacon. Tofu (firm) and tempeh. Limited amounts of nuts and seeds, such as almonds, walnuts, brazil nuts, pecans, peanuts, nut butters, pumpkin seeds, chia seeds, and sunflower seeds. Dairy Lactose-free milk, yogurt, and kefir. Lactose-free cottage cheese and ice cream. Non-dairy milks, such as almond, coconut, hemp, and rice milk. Non-dairy yogurt. Limited amounts of goat cheese, brie, mozzarella, parmesan, swiss, and other hard cheeses. Fats and oils Butter-free spreads. Vegetable oils, such as olive, canola, and sunflower oil. Seasoning and other foods Artificial sweeteners with names that do not end in "ol," such as aspartame, saccharine, and stevia. Maple syrup, white table sugar, raw sugar, brown sugar, and molasses. Mayonnaise, soy sauce, and tamari. Fresh basil, coriander, parsley, rosemary, and thyme. Beverages Water and mineral water. Sugar-sweetened soft drinks. Small amounts of orange juice or cranberry juice. Black and green tea. Most dry wines.   Coffee. The items listed  above may not be a complete list of foods and beverages you can eat. Contact a dietitian for more information. Foods to avoid Fruits Fresh, dried, and juiced forms of apple, pear, watermelon, peach, plum, cherries, apricots, blackberries, boysenberries, figs, nectarines, and mango. Avocado. Vegetables Chicory root, artichoke, asparagus, cabbage, snow peas, Brussels sprouts, broccoli, sugar snap peas, mushrooms, celery, and cauliflower. Onions, garlic, leeks, and the white part of scallions. Grains Wheat, including kamut, durum, and semolina. Barley and bulgur. Couscous. Wheat-based cereals. Wheat noodles, bread, crackers, and pastries. Meats and other proteins Fried or fatty meat. Sausage. Cashews and pistachios. Soybeans, baked beans, black beans, chickpeas, kidney beans, fava beans, navy beans, lentils, black-eyed peas, and split peas. Dairy Milk, yogurt, ice cream, and soft cheese. Cream and sour cream. Milk-based sauces. Custard. Buttermilk. Soy milk. Seasoning and other foods Any sugar-free gum or candy. Foods that contain artificial sweeteners such as sorbitol, mannitol, isomalt, or xylitol. Foods that contain honey, high-fructose corn syrup, or agave. Bouillon, vegetable stock, beef stock, and chicken stock. Garlic and onion powder. Condiments made with onion, such as hummus, chutney, pickles, relish, salad dressing, and salsa. Tomato paste. Beverages Chicory-based drinks. Coffee substitutes. Chamomile tea. Fennel tea. Sweet or fortified wines such as port or sherry. Diet soft drinks made with isomalt, mannitol, maltitol, sorbitol, or xylitol. Apple, pear, and mango juice. Juices with high-fructose corn syrup. The items listed above may not be a complete list of foods and beverages you should avoid. Contact a dietitian for more information. Summary FODMAP stands for fermentable oligosaccharides, disaccharides, monosaccharides, and polyols. These are sugars that are hard for some people to  digest. A low-FODMAP eating plan is a short-term diet that helps to ease symptoms of certain bowel diseases. The eating plan usually lasts up to 6 weeks. After that, high-FODMAP foods are reintroduced gradually and one at a time. This can help you find out which foods may be causing symptoms. A low-FODMAP eating plan can be complicated. It is best to work with a dietitian who has experience with this type of plan. This information is not intended to replace advice given to you by your health care provider. Make sure you discuss any questions you have with your health care provider. Document Revised: 01/14/2020 Document Reviewed: 01/14/2020 Elsevier Patient Education  2024 [employer]  

## 2023-03-19 NOTE — Progress Notes (Signed)
Subjective: ZO:[id number] CP hospital follow up PCP: [name], DO UJW:JXBJ KASHMERE STAFFA is a 77 y.o. male presenting to clinic today for:  1.  Hospital follow-up for atypical chest pain Patient was admitted to the hospital for atypical chest pain.  He is states that this started after he was given his hormone injection prior to his prostate procedure coming up.  He has since been okay.  Had cardiac workup which was unremarkable for MI.  Will be proceeding with his prostate seed placement in the next couple of weeks.  He does voice some anxiety surrounding this issue.  He has not taken any of the alprazolam since this was prescribed well over a year ago.  He is not sure that he wants to be back on it but after further discussion about symptoms that are related to diarrheal/GI symptoms he thinks that perhaps his nerves are impacting his GI symptoms and would like to proceed.  Denies any rectal bleeding, nausea, vomiting or abdominal pain.  Not currently taking any IBS type medications.   ROS: Per HPI  Allergies  Allergen Reactions   Iodinated Contrast Media Hives and Rash    Pt presented to CT after completed 13 hr premedication regimen and still had an minor rash/hives.    Iohexol Rash    CT scan contrast caused rash and itching    does fine with premeds.   Pravastatin     Numbness in feet    Iodine Hives and Rash    CT scan contrast caused rash and itching    Past Medical History:  Diagnosis Date   Anemia    Axillary adenopathy 04/24/2018   CAD (coronary artery disease)    Mild nonobstructive plaque in cath 2013   Chest pain    December, 2013   Cough with hemoptysis 04/22/2018   Dizziness    Dyslipidemia 07/14/2019   Encounter for antineoplastic immunotherapy 05/13/2018   Gout    Hemorrhoid    Hyperlipidemia    Hypertension    Hypothyroidism (acquired) 08/11/2018   Metastasis to lung (HCC) 05/19/2018   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)  (HCC) dx'd 04/17/18   to LN, infrahilar mass, lung nodule and lt axilla   Obesity, unspecified 03/28/2009   Qualifier: Diagnosis of  By: [name], MD, [name]     Palpitations 03/28/2009   Qualifier: Diagnosis of  By: [name], MD, [name]     Right lower lobe lung mass 04/22/2018   Skin cancer    Sleep apnea    Spinal stenosis    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    Current Outpatient Medications:    acetaminophen (TYLENOL) 500 MG tablet, Take 1,000 mg by mouth every 6 (six) hours as needed for moderate pain., Disp: , Rfl:    albuterol (VENTOLIN HFA) 108 (90 Base) MCG/ACT inhaler, Inhale 2 puffs into the lungs every 6 (six) hours as needed for wheezing or shortness of breath., Disp: 8 g, Rfl: 6   ALPRAZolam (XANAX) 0.25 MG tablet, Take 1 tablet (0.25 mg total) by mouth at bedtime as needed for anxiety., Disp: 30 tablet, Rfl: 0   Artificial Tear Solution (SOOTHE XP OP), Place 1 drop into both eyes 2 (two) times daily., Disp: , Rfl:    carvedilol (COREG) 25 MG tablet, Take 1 tablet (25 mg total) by mouth 2 (two) times daily with a meal., Disp: 180 tablet, Rfl: 2   clopidogrel (PLAVIX) 75 MG  tablet, TAKE 1 TABLET BY MOUTH EVERY DAY, Disp: 90 tablet, Rfl: 2   [employer] IRON 325 (65 Fe) MG tablet, TAKE 1 TABLET BY MOUTH DAILY (Patient taking differently: Take 325 mg by mouth daily with breakfast.), Disp: 90 tablet, Rfl: 1   fluticasone (FLONASE) 50 MCG/ACT nasal spray, Place 1 spray into both nostrils daily as needed for allergies., Disp: , Rfl:    levothyroxine (SYNTHROID) 175 MCG tablet, TAKE 1 TABLET BY MOUTH EVERY DAY, Disp: 90 tablet, Rfl: 3   methocarbamol (ROBAXIN) 500 MG tablet, Take 500 mg by mouth every 6 (six) hours as needed for muscle spasms., Disp: , Rfl:    nitroGLYCERIN (NITROSTAT) 0.4 MG SL tablet, Place 1 tablet (0.4 mg total) under the tongue every 5 (five) minutes as needed for chest pain., Disp: 25 tablet, Rfl: 2   pantoprazole (PROTONIX) 40 MG  tablet, TAKE 1 TABLET BY MOUTH DAILY BEFORE BREAKFAST, Disp: 90 tablet, Rfl: 3   PRALUENT 150 MG/ML SOAJ, INJECT 150 MG INTO THE SKIN EVERY 14 (FOURTEEN) DAYS., Disp: 6 mL, Rfl: 3   predniSONE (DELTASONE) 50 MG tablet, Take one tablet 13 hours , one  tablet 7 hours and 1 tablet one hour prior to CT scan. Take benadryl 50 mg one hour prior to scan. Must have a driver., Disp: 3 tablet, Rfl: 5   tamsulosin (FLOMAX) 0.4 MG CAPS capsule, Take 0.4 mg by mouth at bedtime., Disp: , Rfl:  Social History   Socioeconomic History   Marital status: Widowed    Spouse name: Not on file   Number of children: 1   Years of education: Not on file   Highest education level: Not on file  Occupational History   Occupation: Emergency planning/management officer  Tobacco Use   Smoking status: Former    Packs/day: 0.20    Years: 2.00    Additional pack years: 0.00    Total pack years: 0.40    Types: Cigarettes    Start date: 10/01/1968    Quit date: 1972    Years since quitting: 52.5   Smokeless tobacco: Never  Vaping Use   Vaping Use: Never used  Substance and Sexual Activity   Alcohol use: Yes    Alcohol/week: 2.0 standard drinks of alcohol    Types: 2 Glasses of wine per week    Comment: occ   Drug use: No   Sexual activity: Not Currently  Other Topics Concern   Not on file  Social History Narrative   Unable to ask intimate partner violence questions, wife present.    Pt's wife died in 09-03-2021.   Social Determinants of Health   Financial Resource Strain: Low Risk  (11/22/2022)   Overall Financial Resource Strain (CARDIA)    Difficulty of Paying Living Expenses: Not hard at all  Food Insecurity: No Food Insecurity (03/11/2023)   Hunger Vital Sign    Worried About Running Out of Food in the Last Year: Never true    Ran Out of Food in the Last Year: Never true  Transportation Needs: No Transportation Needs (03/11/2023)   PRAPARE - Administrator, Civil Service (Medical): No    Lack of Transportation  (Non-Medical): No  Physical Activity: Sufficiently Active (11/22/2022)   Exercise Vital Sign    Days of Exercise per Week: 4 days    Minutes of Exercise per Session: 60 min  Stress: No Stress Concern Present (11/22/2022)   [employer] of Occupational Health - Occupational Stress Questionnaire  Feeling of Stress : Not at all  Social Connections: Moderately Isolated (11/22/2022)   Social Connection and Isolation Panel [NHANES]    Frequency of Communication with Friends and Family: More than three times a week    Frequency of Social Gatherings with Friends and Family: More than three times a week    Attends Religious Services: Never    Database administrator or Organizations: Yes    Attends Engineer, structural: More than 4 times per year    Marital Status: Widowed  Intimate Partner Violence: Not At Risk (03/11/2023)   Humiliation, Afraid, Rape, and Kick questionnaire    Fear of Current or Ex-Partner: No    Emotionally Abused: No    Physically Abused: No    Sexually Abused: No   Family History  Problem Relation Age of Onset   Heart failure Mother    Parkinson's disease Mother    Heart attack Father    Healthy Sister    Skin cancer Sister    Healthy Sister    Healthy Sister    Neuropathy Brother    COPD Brother    Epilepsy Brother    Colon cancer Neg Hx    Pancreatic cancer Neg Hx    Prostate cancer Neg Hx    Rectal cancer Neg Hx    Stomach cancer Neg Hx     Objective: Office vital signs reviewed. BP 118/73   Pulse 77   Temp 98.7 F (37.1 C)   Ht 6\' 2"  (1.88 m)   Wt 242 lb (109.8 kg)   SpO2 97%   BMI 31.07 kg/m   Physical Examination:  General: Awake, alert, well nourished, No acute distress HEENT: No exophthalmos.  No goiter Cardio: regular rate and rhythm, S1S2 heard, no murmurs appreciated Pulm: clear to auscultation bilaterally, no wheezes, rhonchi or rales; normal work of breathing on room air Psych: Very pleasant, interactive male      03/19/2023    3:28 PM 11/22/2022    2:22 PM 09/11/2022    2:14 PM  Depression screen PHQ 2/9  Decreased Interest 0 0 0  Down, Depressed, Hopeless 0 0 0  PHQ - 2 Score 0 0 0  Altered sleeping 0  3  Tired, decreased energy 0  0  Change in appetite 0  0  Feeling bad or failure about yourself  0    Trouble concentrating 0  0  Moving slowly or fidgety/restless 0  0  Suicidal thoughts 0  0  PHQ-9 Score 0  3  Difficult doing work/chores Not difficult at all  Not difficult at all      03/19/2023    3:29 PM 09/11/2022    2:15 PM 04/18/2022   11:06 AM 11/08/2021   10:17 AM  GAD 7 : Generalized Anxiety Score  Nervous, Anxious, on Edge 0 0 1 1  Control/stop worrying 0 0 1 1  Worry too much - different things 0 0 1 1  Trouble relaxing 0 0  1  Restless 0 0 0 1  Easily annoyed or irritable 0 0 0 0  Afraid - awful might happen 0 0 0 0  Total GAD 7 Score 0 0  5  Anxiety Difficulty  Not difficult at all Not difficult at all Somewhat difficult    Assessment/ Plan: 77 y.o. male   Chest pain, unspecified type - Plan: CBC, Basic Metabolic Panel  Hospital discharge follow-up - Plan: CBC, Basic Metabolic Panel  Diarrhea, unspecified type - Plan: Alpha-Gal  Panel  Controlled substance agreement signed - Plan: Drug Screen 10 W/Conf, Serum  Anxiety about health - Plan: Drug Screen 10 W/Conf, Serum  Situational anxiety - Plan: ALPRAZolam (XANAX) 0.25 MG tablet  Hypothyroidism (acquired) - Plan: Thyroid Panel With TSH  Recheck CBC, BMP.  Currently asymptomatic.  Do question if perhaps situational anxiety may be impacting.    UDS and CSC were updated today and Xanax renewed.  National narcotic database reviewed and there were no red flags  Check alpha gal given diarrhea without cause.  Question possible IBS-D that is exacerbated by anxiety symptoms.  Discussed use of probiotics and to reconvene in the next couple of months.  If were not seeing any substantial improvement, we could consider something like  Xifaxan.  Check thyroid levels as well  No orders of the defined types were placed in this encounter.  No orders of the defined types were placed in this encounter.   [name], DO [facility] [phone]

## 2023-03-20 ENCOUNTER — Other Ambulatory Visit: Payer: Self-pay

## 2023-03-20 DIAGNOSIS — E039 Hypothyroidism, unspecified: Secondary | ICD-10-CM

## 2023-03-20 LAB — THYROID PANEL WITH TSH
Free Thyroxine Index: 2.5 (ref 1.2–4.9)
T3 Uptake Ratio: 31 % (ref 24–39)
T4, Total: 8 ug/dL (ref 4.5–12.0)
TSH: 0.06 u[IU]/mL — ABNORMAL LOW (ref 0.450–4.500)

## 2023-03-21 ENCOUNTER — Ambulatory Visit: Payer: Medicare Other

## 2023-03-21 ENCOUNTER — Ambulatory Visit: Payer: Medicare Other | Attending: General Practice | Admitting: General Practice

## 2023-03-21 ENCOUNTER — Encounter: Payer: Self-pay | Admitting: General Practice

## 2023-03-21 VITALS — BP 118/74 | HR 72 | Ht 74.0 in | Wt 240.4 lb

## 2023-03-21 DIAGNOSIS — E785 Hyperlipidemia, unspecified: Secondary | ICD-10-CM | POA: Insufficient documentation

## 2023-03-21 DIAGNOSIS — I77819 Aortic ectasia, unspecified site: Secondary | ICD-10-CM | POA: Diagnosis not present

## 2023-03-21 DIAGNOSIS — I10 Essential (primary) hypertension: Secondary | ICD-10-CM | POA: Insufficient documentation

## 2023-03-21 DIAGNOSIS — I2510 Atherosclerotic heart disease of native coronary artery without angina pectoris: Secondary | ICD-10-CM | POA: Diagnosis not present

## 2023-03-21 DIAGNOSIS — Z01810 Encounter for preprocedural cardiovascular examination: Secondary | ICD-10-CM | POA: Diagnosis not present

## 2023-03-21 NOTE — Patient Instructions (Signed)
Medication Instructions:  The current medical regimen is effective;  continue present plan and medications as directed. Please refer to the Current Medication list given to you today.  *If you need a refill on your cardiac medications before your next appointment, please call your pharmacy*  Lab Work: NONE If you have labs (blood work) drawn today and your tests are completely normal, you will receive your results only by: MyChart Message (if you have MyChart) OR A paper copy in the mail If you have any lab test that is abnormal or we need to change your treatment, we will call you to review the results.  Other Instructions INCREASE HYDRATION Please try to avoid these triggers: Do not use any products that have nicotine or tobacco in them. These include cigarettes, e-cigarettes, and chewing tobacco. If you need help quitting, ask your doctor. Eat heart-healthy foods. Talk with your doctor about the right eating plan for you. Exercise regularly as told by your doctor. Stay hydrated Do not drink alcohol, Caffeine or chocolate. Lose weight if you are overweight. Do not use drugs, including cannabis  Follow-Up: At [facility], you and your health needs are our priority.  As part of our continuing mission to provide you with exceptional heart care, we have created designated Provider Care Teams.  These Care Teams include your primary Cardiologist (physician) and Advanced Practice Providers (APPs -  Physician Assistants and Nurse Practitioners) who all work together to provide you with the care you need, when you need it.  Your next appointment:   6 month(s)  Provider:   [name], MD

## 2023-03-22 ENCOUNTER — Encounter (HOSPITAL_BASED_OUTPATIENT_CLINIC_OR_DEPARTMENT_OTHER): Payer: Self-pay | Admitting: Urology

## 2023-03-26 LAB — ALPHA-GAL PANEL
Allergen Lamb IgE: 0.12 kU/L — AB
Beef IgE: 0.19 kU/L — AB
IgE (Immunoglobulin E), Serum: 138 IU/mL (ref 6–495)
O215-IgE Alpha-Gal: 1.27 kU/L — AB
Pork IgE: <0.1 kU/L

## 2023-03-26 LAB — BASIC METABOLIC PANEL
BUN/Creatinine Ratio: 14 (ref 10–24)
BUN: 15 mg/dL (ref 8–27)
CO2: 23 mmol/L (ref 20–29)
Calcium: 9.2 mg/dL (ref 8.6–10.2)
Chloride: 108 mmol/L — ABNORMAL HIGH (ref 96–106)
Creatinine, Ser: 1.04 mg/dL (ref 0.76–1.27)
Glucose: 96 mg/dL (ref 70–99)
Potassium: 4.8 mmol/L (ref 3.5–5.2)
Sodium: 146 mmol/L — ABNORMAL HIGH (ref 134–144)
eGFR: 75 mL/min/{1.73_m2} (ref >59)

## 2023-03-26 LAB — CBC
Hematocrit: 40.6 % (ref 37.5–51.0)
Hemoglobin: 13.6 g/dL (ref 13.0–17.7)
MCH: 33.7 pg — ABNORMAL HIGH (ref 26.6–33.0)
MCHC: 33.5 g/dL (ref 31.5–35.7)
MCV: 101 fL — ABNORMAL HIGH (ref 79–97)
Platelets: 161 10*3/uL (ref 150–450)
RBC: 4.03 x10E6/uL — ABNORMAL LOW (ref 4.14–5.80)
RDW: 12.4 % (ref 11.6–15.4)
WBC: 4.8 10*3/uL (ref 3.4–10.8)

## 2023-03-26 LAB — DRUG SCREEN 10 W/CONF, SERUM
Amphetamines, IA: NEGATIVE ng/mL
Barbiturates, IA: NEGATIVE ug/mL
Benzodiazepines, IA: NEGATIVE ng/mL
Cocaine & Metabolite, IA: NEGATIVE ng/mL
Methadone, IA: NEGATIVE ng/mL
Opiates, IA: NEGATIVE ng/mL
Oxycodones, IA: NEGATIVE ng/mL
Phencyclidine, IA: NEGATIVE ng/mL
Propoxyphene, IA: NEGATIVE ng/mL

## 2023-03-26 LAB — THC,MS,WB/SP RFX
Cannabidiol: NEGATIVE ng/mL
Hydroxy-THC: NEGATIVE ng/mL
Tetrahydrocannabinol(THC): NEGATIVE ng/mL

## 2023-03-29 ENCOUNTER — Telehealth: Payer: Self-pay | Admitting: *Deleted

## 2023-03-29 DIAGNOSIS — Z01818 Encounter for other preprocedural examination: Secondary | ICD-10-CM

## 2023-03-29 HISTORY — DX: Personal history of colonic polyps: Z86.010

## 2023-03-29 NOTE — Telephone Encounter (Signed)
RETURNED PATIENT'S PHONE CALL, SPOKE WITH PATIENT. ?

## 2023-04-02 ENCOUNTER — Encounter (HOSPITAL_BASED_OUTPATIENT_CLINIC_OR_DEPARTMENT_OTHER): Payer: Self-pay | Admitting: Urology

## 2023-04-02 ENCOUNTER — Ambulatory Visit: Payer: Medicare Other | Admitting: Radiation Oncology

## 2023-04-02 ENCOUNTER — Telehealth: Payer: Self-pay | Admitting: *Deleted

## 2023-04-02 NOTE — Telephone Encounter (Signed)
RETURNED PATIENT'S PHONE CALL, SPOKE WITH PATIENT. ?

## 2023-04-16 ENCOUNTER — Other Ambulatory Visit: Payer: Self-pay | Admitting: Urology

## 2023-04-17 ENCOUNTER — Encounter (HOSPITAL_BASED_OUTPATIENT_CLINIC_OR_DEPARTMENT_OTHER): Payer: Self-pay | Admitting: Anesthesiology

## 2023-04-17 ENCOUNTER — Encounter (HOSPITAL_BASED_OUTPATIENT_CLINIC_OR_DEPARTMENT_OTHER)
Admission: RE | Disposition: A | Discharge status: Home / Self Care | Payer: Self-pay | Source: Home / Self Care | Attending: Urology

## 2023-04-17 ENCOUNTER — Encounter (HOSPITAL_BASED_OUTPATIENT_CLINIC_OR_DEPARTMENT_OTHER): Payer: Self-pay | Admitting: Urology

## 2023-04-17 ENCOUNTER — Encounter (HOSPITAL_BASED_OUTPATIENT_CLINIC_OR_DEPARTMENT_OTHER): Payer: Medicare Other | Admitting: Anesthesiology

## 2023-04-17 ENCOUNTER — Other Ambulatory Visit: Payer: Self-pay

## 2023-04-17 ENCOUNTER — Ambulatory Visit (HOSPITAL_BASED_OUTPATIENT_CLINIC_OR_DEPARTMENT_OTHER)
Admission: RE | Admit: 2023-04-17 | Discharge: 2023-04-17 | Disposition: A | Discharge status: Home / Self Care | Payer: Medicare Other | Attending: Urology | Admitting: Urology

## 2023-04-17 DIAGNOSIS — G4733 Obstructive sleep apnea (adult) (pediatric): Secondary | ICD-10-CM | POA: Insufficient documentation

## 2023-04-17 DIAGNOSIS — Z87891 Personal history of nicotine dependence: Secondary | ICD-10-CM

## 2023-04-17 DIAGNOSIS — I2510 Atherosclerotic heart disease of native coronary artery without angina pectoris: Secondary | ICD-10-CM

## 2023-04-17 DIAGNOSIS — C61 Malignant neoplasm of prostate: Secondary | ICD-10-CM | POA: Diagnosis not present

## 2023-04-17 DIAGNOSIS — E785 Hyperlipidemia, unspecified: Secondary | ICD-10-CM | POA: Diagnosis not present

## 2023-04-17 DIAGNOSIS — I10 Essential (primary) hypertension: Secondary | ICD-10-CM | POA: Insufficient documentation

## 2023-04-17 DIAGNOSIS — Z7902 Long term (current) use of antithrombotics/antiplatelets: Secondary | ICD-10-CM | POA: Insufficient documentation

## 2023-04-17 DIAGNOSIS — Z01818 Encounter for other preprocedural examination: Secondary | ICD-10-CM

## 2023-04-17 DIAGNOSIS — J449 Chronic obstructive pulmonary disease, unspecified: Secondary | ICD-10-CM | POA: Diagnosis not present

## 2023-04-17 HISTORY — DX: Unspecified hemorrhoids: K64.9

## 2023-04-17 HISTORY — DX: Personal history of other diseases of the musculoskeletal system and connective tissue: Z87.39

## 2023-04-17 HISTORY — DX: Personal history of other malignant neoplasm of skin: Z85.828

## 2023-04-17 HISTORY — DX: Personal history of irradiation: Z92.3

## 2023-04-17 HISTORY — DX: Diverticulosis of large intestine without perforation or abscess without bleeding: K57.30

## 2023-04-17 HISTORY — PX: GOLD SEED IMPLANT: SHX6343

## 2023-04-17 HISTORY — DX: Personal history of antineoplastic chemotherapy: Z92.21

## 2023-04-17 HISTORY — DX: Personal history of adenomatous and serrated colon polyps: Z86.0101

## 2023-04-17 HISTORY — DX: Emphysema, unspecified: J43.9

## 2023-04-17 HISTORY — DX: Presence of other vascular implants and grafts: Z95.828

## 2023-04-17 HISTORY — DX: Benign prostatic hyperplasia with lower urinary tract symptoms: N40.1

## 2023-04-17 HISTORY — DX: Gastro-esophageal reflux disease without esophagitis: K21.9

## 2023-04-17 HISTORY — DX: Obstructive sleep apnea (adult) (pediatric): G47.33

## 2023-04-17 HISTORY — DX: Iron deficiency anemia, unspecified: D50.9

## 2023-04-17 HISTORY — DX: Personal history of other malignant neoplasm of skin: Z98.890

## 2023-04-17 HISTORY — DX: Calculus of kidney: N20.0

## 2023-04-17 HISTORY — PX: SPACE OAR INSTILLATION: SHX6769

## 2023-04-17 HISTORY — DX: Personal history of other specified conditions: Z87.898

## 2023-04-17 HISTORY — DX: Other constipation: K59.09

## 2023-04-17 HISTORY — DX: Generalized anxiety disorder: F41.1

## 2023-04-17 HISTORY — DX: Personal history of urinary calculi: Z87.442

## 2023-04-17 HISTORY — DX: Unspecified osteoarthritis, unspecified site: M19.90

## 2023-04-17 SURGERY — INSERTION, GOLD SEEDS
Anesthesia: Monitor Anesthesia Care | Site: Prostate

## 2023-04-17 MED ORDER — FENTANYL CITRATE (PF) 100 MCG/2ML IJ SOLN
INTRAMUSCULAR | Status: AC
  Filled 2023-04-17: qty 2

## 2023-04-17 MED ORDER — FENTANYL CITRATE (PF) 100 MCG/2ML IJ SOLN
INTRAMUSCULAR | Status: DC | PRN
  Administered 2023-04-17 (×4): 12.5 ug via INTRAVENOUS

## 2023-04-17 MED ORDER — PROPOFOL 10 MG/ML IV BOLUS
INTRAVENOUS | Status: DC | PRN
Start: 2023-04-17 — End: 2023-04-17
  Administered 2023-04-17 (×5): 20 mg via INTRAVENOUS

## 2023-04-17 MED ORDER — FENTANYL CITRATE (PF) 100 MCG/2ML IJ SOLN: 25.0000 ug | INTRAMUSCULAR | Status: DC | PRN

## 2023-04-17 MED ORDER — CEFAZOLIN SODIUM-DEXTROSE 2-4 GM/100ML-% IV SOLN
INTRAVENOUS | Status: AC
  Filled 2023-04-17: qty 100

## 2023-04-17 MED ORDER — SODIUM CHLORIDE (PF) 0.9 % IJ SOLN
INTRAMUSCULAR | Status: DC | PRN
  Administered 2023-04-17: 10 mL

## 2023-04-17 MED ORDER — OXYCODONE HCL 5 MG PO TABS: 5.0000 mg | ORAL_TABLET | Freq: Once | ORAL | Status: DC | PRN

## 2023-04-17 MED ORDER — CEFAZOLIN SODIUM-DEXTROSE 2-4 GM/100ML-% IV SOLN
2.0000 g | INTRAVENOUS | Status: AC
  Administered 2023-04-17: 2 g via INTRAVENOUS

## 2023-04-17 MED ORDER — LIDOCAINE HCL (PF) 2 % IJ SOLN
INTRAMUSCULAR | Status: AC
  Filled 2023-04-17: qty 5

## 2023-04-17 MED ORDER — LIDOCAINE HCL (CARDIAC) PF 100 MG/5ML IV SOSY
PREFILLED_SYRINGE | INTRAVENOUS | Status: DC | PRN
  Administered 2023-04-17: 60 mg via INTRAVENOUS

## 2023-04-17 MED ORDER — PROPOFOL 500 MG/50ML IV EMUL
INTRAVENOUS | Status: DC | PRN
  Administered 2023-04-17: 100 ug/kg/min via INTRAVENOUS

## 2023-04-17 MED ORDER — BUPIVACAINE HCL (PF) 0.25 % IJ SOLN
INTRAMUSCULAR | Status: DC | PRN
Start: 2023-04-17 — End: 2023-04-17
  Administered 2023-04-17: 10 mL

## 2023-04-17 MED ORDER — ACETAMINOPHEN 500 MG PO TABS
ORAL_TABLET | ORAL | Status: AC
  Filled 2023-04-17: qty 2

## 2023-04-17 MED ORDER — ONDANSETRON HCL 4 MG/2ML IJ SOLN
INTRAMUSCULAR | Status: AC
  Filled 2023-04-17: qty 2

## 2023-04-17 MED ORDER — OXYCODONE HCL 5 MG/5ML PO SOLN: 5.0000 mg | Freq: Once | ORAL | Status: DC | PRN

## 2023-04-17 MED ORDER — DEXMEDETOMIDINE HCL IN NACL 80 MCG/20ML IV SOLN
INTRAVENOUS | Status: AC
  Filled 2023-04-17: qty 20

## 2023-04-17 MED ORDER — LACTATED RINGERS IV SOLN
INTRAVENOUS | Status: DC
  Administered 2023-04-17: 1000 mL via INTRAVENOUS

## 2023-04-17 MED ORDER — ONDANSETRON HCL 4 MG/2ML IJ SOLN
INTRAMUSCULAR | Status: DC | PRN
Start: 2023-04-17 — End: 2023-04-17
  Administered 2023-04-17: 4 mg via INTRAVENOUS

## 2023-04-17 MED ORDER — DEXMEDETOMIDINE HCL IN NACL 80 MCG/20ML IV SOLN
INTRAVENOUS | Status: DC | PRN
  Administered 2023-04-17 (×3): 4 ug via INTRAVENOUS

## 2023-04-17 MED ORDER — PROPOFOL 500 MG/50ML IV EMUL
INTRAVENOUS | Status: AC
  Filled 2023-04-17: qty 50

## 2023-04-17 MED ORDER — GLYCOPYRROLATE 0.2 MG/ML IJ SOLN
INTRAMUSCULAR | Status: DC | PRN
  Administered 2023-04-17: .2 mg via INTRAVENOUS

## 2023-04-17 MED ORDER — ACETAMINOPHEN 500 MG PO TABS
1000.0000 mg | ORAL_TABLET | Freq: Once | ORAL | Status: AC
  Administered 2023-04-17: 1000 mg via ORAL

## 2023-04-17 SURGICAL SUPPLY — 26 items
BLADE CLIPPER SENSICLIP SURGIC (BLADE) ×1 IMPLANT
CNTNR URN SCR LID CUP LEK RST (MISCELLANEOUS) ×1 IMPLANT
CONT SPEC 4OZ STRL OR WHT (MISCELLANEOUS) ×1
COVER BACK TABLE 60X90IN (DRAPES) ×1 IMPLANT
DRSG TEGADERM 4X4.75 (GAUZE/BANDAGES/DRESSINGS) ×1 IMPLANT
DRSG TEGADERM 8X12 (GAUZE/BANDAGES/DRESSINGS) ×1 IMPLANT
GAUZE SPONGE 4X4 12PLY STRL (GAUZE/BANDAGES/DRESSINGS) ×1 IMPLANT
GAUZE SPONGE 4X4 12PLY STRL LF (GAUZE/BANDAGES/DRESSINGS) IMPLANT
GLOVE BIO SURGEON STRL SZ7.5 (GLOVE) ×1 IMPLANT
HIBICLENS CHG 4% 4OZ (MISCELLANEOUS) IMPLANT
IMPL SPACEOAR SYSTEM 10ML (Spacer) IMPLANT
IMPLANT SPACEOAR SYSTEM 10ML (Spacer) ×1 IMPLANT
KIT TURNOVER CYSTO (KITS) ×1 IMPLANT
MARKER GOLD PRELOAD 1.2X3 (Urological Implant) ×1 IMPLANT
MARKER SKIN DUAL TIP RULER LAB (MISCELLANEOUS) ×1 IMPLANT
NDL SPNL 22GX3.5 QUINCKE BK (NEEDLE) ×1 IMPLANT
NEEDLE SPNL 22GX3.5 QUINCKE BK (NEEDLE) ×1
SEED GOLD PRELOAD 1.2X3 (Urological Implant) ×1 IMPLANT
SHEATH ULTRASOUND LTX NONSTRL (SHEATH) IMPLANT
SLEEVE SCD COMPRESS KNEE MED (STOCKING) ×1 IMPLANT
SPIKE FLUID TRANSFER (MISCELLANEOUS) IMPLANT
SURGILUBE 2OZ TUBE FLIPTOP (MISCELLANEOUS) ×1 IMPLANT
SYR 10ML LL (SYRINGE) ×1 IMPLANT
SYR CONTROL 10ML LL (SYRINGE) ×1 IMPLANT
TOWEL OR 17X24 6PK STRL BLUE (TOWEL DISPOSABLE) ×1 IMPLANT
UNDERPAD 30X36 HEAVY ABSORB (UNDERPADS AND DIAPERS) ×1 IMPLANT

## 2023-04-17 NOTE — Anesthesia Preprocedure Evaluation (Addendum)
Anesthesia Evaluation  Patient identified by MRN, date of birth, ID band Patient awake    Reviewed: Allergy & Precautions, NPO status , Patient's Chart, lab work & pertinent test results, reviewed documented beta blocker date and time   History of Anesthesia Complications Negative for: history of anesthetic complications  Airway Mallampati: III  TM Distance: >3 FB Neck ROM: Full    Dental no notable dental hx.    Pulmonary sleep apnea , COPD,  COPD inhaler, former smoker   Pulmonary exam normal        Cardiovascular hypertension, Pt. on home beta blockers and Pt. on medications + CAD and + Cardiac Stents (2022)  Normal cardiovascular exam     Neuro/Psych   Anxiety        GI/Hepatic Neg liver ROS,GERD  Medicated,,  Endo/Other  Hypothyroidism    Renal/GU negative Renal ROS     Musculoskeletal  (+) Arthritis ,    Abdominal   Peds  Hematology negative hematology ROS (+)   Anesthesia Other Findings Prostate ca  Reproductive/Obstetrics                             Anesthesia Physical Anesthesia Plan  ASA: 3  Anesthesia Plan: MAC   Post-op Pain Management: Minimal or no pain anticipated   Induction:   PONV Risk Score and Plan: 1 and Treatment may vary due to age or medical condition, Propofol infusion and Ondansetron  Airway Management Planned: Natural Airway and Simple Face Mask  Additional Equipment: None  Intra-op Plan:   Post-operative Plan:   Informed Consent: I have reviewed the patients History and Physical, chart, labs and discussed the procedure including the risks, benefits and alternatives for the proposed anesthesia with the patient or authorized representative who has indicated his/her understanding and acceptance.       Plan Discussed with: CRNA  Anesthesia Plan Comments:        Anesthesia Quick Evaluation

## 2023-04-17 NOTE — Transfer of Care (Signed)
Immediate Anesthesia Transfer of Care Note  Patient: Travis Padilla  Procedure(s) Performed: Procedure(s) (LRB): GOLD SEED IMPLANT (N/A) SPACE OAR INSTILLATION (N/A)  Patient Location: PACU  Anesthesia Type: MAC  Level of Consciousness: awake, sedated, patient cooperative and responds to stimulation  Airway & Oxygen Therapy: Patient Spontanous Breathing and Patient on RA  Post-op Assessment: Report given to PACU RN, Post -op Vital signs reviewed and stable and Patient moving all extremities  Post vital signs: Reviewed and stable  Complications: No apparent anesthesia complications

## 2023-04-17 NOTE — H&P (Signed)
Office Visit Report     03/11/2023   --------------------------------------------------------------------------------   Travis Padilla  MRN: [id number]  DOB: 08-Sep-1946, 77 year old Male  SSN: [id number]   PRIMARY CARE:  Ashly M. Nadine Counts, Travis Padilla  PRIMARY CARE FAX:  [phone]  REFERRING:  Jannifer Hick, MD  PROVIDER:  Jettie Pagan, M.D.  LOCATION:  [facility] [phone]     --------------------------------------------------------------------------------   CC/HPI: Travis Padilla is a 77 year old male seen today in follow-up with history of BPH/LUTS, unfavorable intermediate risk prostate cancer.   Of note, he developed unstable angina. He underwent a LHC 05/25/2021 which showed 80% mid LAD (chronic total occlusion) and sequential 70-95% proximal mid RCA lesion. He received DES x1 to his LAD. He had staged PCI 05/26/2021 with successful coronary arthrectomy and stenting of high-grade calcified lesions of proximal-mid RCA. He is now on Plavix. He also has a history of metastatic stage IV lung cancer.   1. Unfavorable intermediate risk prostate cancer:  Patient underwent prostate biopsy on 01/15/2023 for an elevated PSA of 5.4ng/mL. 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  -CT A/P 02/23/2023 with no evidence of metastatic disease. Bone scan 02/22/2023 with no evidence of metastatic disease.  Denies new or worsening bone or back pain. Good appetite and stable weight.   Family history: None  Imaging studies: Pending   PMH:  Of note, he has a history of stage IV non-small cell lung cancer, squamous cell carcinoma who initially presented with a large right infrahilar mass in addition to a left upper lobe lung nodule which was diagnosed in August 2019. He underwent palliative radiotherapy to the right infrahilar mass in the axillary mass under the care of Dr.  Mitzi Hansen. He is currently undergoing systemic chemotherapy with carboplatin, paclitaxel, Keytruda.   He has a past medical history of CAD, unstable angina, HTN, HLD, hypothyroidism, metastatic lung cancer, basal skin cancer.   TNM stage: cT1cNxMx  PSA: 5.43  Gleason score: GS 4+3 = 7  Prostate volume: 59 cc  PSAD: 0.09   Nomogram  CSS (15 year): 94%  PFS (5 year, 10 year): 59%, 43%  EPE: 66%  LNI: 17%  SVI: 16%   IPSS: 10  SHIM: He did not complete this form   He has met with Dr. Kathrynn Running and has elected proceed with EBRT with ST-ADT. He is currently scheduled for fiducial marker and SpaceOAR placement on 03/29/2023. Eligard 45mg  given on 03/11/2023.   2. BPH/LUTS: He complains of stable of lower urinary tract symptoms with urinary frequency, urgency weak flow stream, 1 time nocturia. His IPSS score is 10, quality-of-life 2. He does remain on tamsulosin with benefit. He does have some urgency however does not wish to treat this at this time.   #3. Urolithiasis: He was incidentally found to have a 3 mm right UVJ stone on CT A/P 08/15/2020. CT A/P 02/2023 with bilateral nonobstructing stones, measuring up to 4 mm in the left upper pole. No ureteral stone. No hydronephrosis. He is asymptomatic without flank pain.   4. Penile lesion: He had an ulcerated penile lesion in 05/2021 that resolved with topical barrier cream and Bactrim.   5. Right testicular pain: He has a history of intermittent orchitis and after a course of Bactrim, he has had no recurrence of his right testicular pain.  He does have a TURBT history with a benign lesion. He denies gross hematuria.   Of note, he has a history of stage IV non-small cell lung cancer, squamous cell carcinoma who initially presented with a large right infrahilar mass in addition to a left upper lobe lung nodule which was diagnosed in May 07, 2018. He underwent palliative radiotherapy to the right infrahilar mass in the axillary mass under the care of Dr.  Mitzi Hansen. He is currently undergoing systemic chemotherapy with carboplatin, paclitaxel, Keytruda.   He has a past medical history of CAD, unstable angina, HTN, HLD, hypothyroidism, metastatic lung cancer, basal skin cancer.     ALLERGIES: No Allergies    MEDICATIONS: Plavix 75 mg tablet  Tamsulosin Hcl 0.4 mg capsule 1 capsule PO Q HS  Amlodipine Besylate 5 mg tablet Oral  Aspirin Ec 81 mg tablet, delayed release Oral  Carvedilol 3.125 mg tablet Oral  Multivitamin/Iron TABS Oral  Pravastatin Sodium 80 mg tablet Oral  Vitamin B-12 TABS Oral     GU PSH: Cystoscopy TURBT <2 cm - 2010/05/07 Prostate Needle Biopsy - 01/15/2023 Vasectomy - May 07, 2010       PSH Notes: Cataract Surgery, Cystoscopy With Fulguration Small Lesion (5-55mm), Surgery Of Male Genitalia Vasectomy   NON-GU PSH: Surgical Pathology, Gross And Microscopic Examination For Prostate Needle - 01/15/2023 Visit Complexity (formerly GPC1X) - 01/22/2023, 11/29/2022     GU PMH: Prostate Cancer - 02/18/2023, - 01/22/2023 Elevated PSA - 01/15/2023, - 11/29/2022, - 09/06/2022, - 05/07/2021 BPH w/LUTS - 11/29/2022, - 09/06/2022, - 07/13/2021, - 06/07/2021, - 05-07-2021 Urinary Frequency - 11/29/2022, - 09/06/2022 Weak Urinary Stream - 11/29/2022, - 07/13/2021, - 06/07/2021, 07-May-2021 Renal calculus - 09/06/2022, - 07/13/2021, - May 07, 2021 Encounter for Prostate Cancer screening - 07/13/2021 Disorder of Penis Ot - 06/07/2021 Orchitis - 06/07/2021, - 05/07/21 Right testicular pain - 06/07/2021, - 2021/05/07 Ureteral calculus (Stable) - 07-May-2021, Right ureteral stone, - 05/08/15 Bladder Cancer, Unspec, Bladder cancer - 05/07/2013 Gross hematuria, Gross hematuria - 2013/05/07 Urinary Tract Inf, Unspec site, Urinary tract infection - 05-07-13      PMH Notes:  2010-01-27 11:40:18 - Note: Gout  2010-01-27 11:40:18 - Note: Arthritis   NON-GU PMH: Encounter for general adult medical examination without abnormal findings, Encounter for preventive health examination - May 08, 2015 Personal history of other diseases of the  circulatory system, History of cardiac disorder - 2015-05-08, History of cardiac arrhythmia, - 05-08-15 Anxiety, Anxiety (Symptom) - May 07, 2013    FAMILY HISTORY: Death In The Family Father - Father Death In The Family Mother - Mother Family Health Status Number - Runs In Family Heart Disease - Father Parkinson's Disease - Mother renal failure - Father   SOCIAL HISTORY: Marital Status: Married     Notes: Former smoker, Retired, Marital History - Currently Married, Alcohol Use, Caffeine Use, Tobacco Use, Occupation:   REVIEW OF SYSTEMS:    GU Review Male:   Patient denies frequent urination, hard to postpone urination, burning/ pain with urination, get up at night to urinate, leakage of urine, stream starts and stops, trouble starting your stream, have to strain to urinate , erection problems, and penile pain.  Gastrointestinal (Upper):   Patient denies nausea, vomiting, and indigestion/ heartburn.  Gastrointestinal (Lower):   Patient denies diarrhea and constipation.  Constitutional:   Patient denies fever, night sweats, weight loss, and fatigue.  Skin:   Patient denies skin rash/ lesion and itching.  Eyes:   Patient denies double vision and blurred vision.  Ears/ Nose/ Throat:   Patient  denies sore throat and sinus problems.  Hematologic/Lymphatic:   Patient denies swollen glands and easy bruising.  Cardiovascular:   Patient denies leg swelling and chest pains.  Respiratory:   Patient denies cough and shortness of breath.  Endocrine:   Patient denies excessive thirst.  Musculoskeletal:   Patient denies back pain and joint pain.  Neurological:   Patient denies headaches and dizziness.  Psychologic:   Patient denies depression and anxiety.   VITAL SIGNS: None   MULTI-SYSTEM PHYSICAL EXAMINATION:    Constitutional: Well-nourished. No physical deformities. Normally developed. Good grooming.  Respiratory: No labored breathing, no use of accessory muscles.   Cardiovascular: Normal temperature, normal  extremity pulses, no swelling, no varicosities.  Gastrointestinal: No mass, no tenderness, no rigidity, non obese abdomen.     Complexity of Data:  Source Of History:  Patient, Medical Record Summary  Lab Test Review:   PSA  Records Review:   Previous Doctor Records, Previous Patient Records  Urine Test Review:   Urinalysis   11/22/22 08/30/22 09/29/20  PSA  Total PSA 5.43 ng/mL 5.86 ng/mL 2.72 ng/mL    PROCEDURES:          Visit Complexity - G2211          Urinalysis - 81003 Dipstick Dipstick Cont'd  Color: Yellow Bilirubin: Neg  Appearance: Clear Ketones: Neg  Specific Gravity: 1.025 Blood: Neg  pH: <=5.0 Protein: Trace  Glucose: Neg Urobilinogen: 0.2    Nitrites: Neg    Leukocyte Esterase: Neg    Notes:            Eligard 45mg / 6 Month - 96402, P7106 The injection site was sterilely prepped with alcohol. Eligard was injected subcutaneously (Gering) using standard technique. The patient tolerated the procedure well. A band aid was applied. The site was dry when the patient left the exam room. The patient will return as scheduled.  zero wasted   Qty: 45 Adm. By: Lissa Hoard McDougald  Unit: mg Lot No [id number]  Route: SQ Exp. Date 03/10/2024  Freq: None Mfgr.:   Site: None   ASSESSMENT:      ICD-10 Details  1 GU:   Prostate Cancer - C61    PLAN:           Document Letter(s):  Created for Patient: Clinical Summary         Notes:    1. Unfavorable intermediate risk prostate cancer:  Newly diagnosed cT1cNxMx GS 4+3 = 7 (Dx 01/15/2023), adenocarcinoma the prostate with 7/12 cores positive (20-60%), prostate volume of 59 cm3, prebiopsy PSA = 5.43ng/mL  He has met with Dr. Kathrynn Running and has elected proceed with EBRT with ST-ADT. He is currently scheduled for fiducial marker and SpaceOAR placement on 03/29/2023. Eligard 45mg  given on 03/11/2023. Discussed risks and benefits.  -F/u in 07/2023 with PSA prior.   Space OAR consent- The patient was counseled about the natural history  of prostate cancer and the standard treatment options that are available for prostate cancer. It was explained to him how his age and life expectancy, clinical stage, Gleason score, and PSA affect his prognosis, the decision to proceed with additional staging studies, as well as how that information influences recommended treatment strategies. We discussed the roles for active surveillance, radiation therapy, surgical therapy, androgen deprivation, as well as ablative therapy options for the treatment of prostate cancer as appropriate to his individual cancer situation. We discussed the risks and benefits of these options with regard to their impact on cancer control and  also in terms of potential adverse events, complications, and impact on quality of life particularly related to urinary and sexual function. The patient was encouraged to ask questions throughout the discussion today and all questions were answered to his stated satisfaction. In addition, the patient was provided with and/or directed to appropriate resources and literature for further education about prostate cancer and treatment options.   The patient has decided to proceed with EBRT. The risks, benefits and alternatives of the aforementioned procedures was discussed in detail. Risks include, bur are not limited to worsening LUTS, erectile dysfunction, rectal irritation, fistula formation, cancer recurrence, MI, CVA, PE, DVT and the inherent risk of general anesthesia. He voices understanding and wishes to proceed.   2. BPH/LUTS: Continue tamsulosin. We discussed he could increase to 0.8 mg daily however I discussed risk of orthostatic hypotension.   3. Urolithiasis: He remains asymptomatic. No intervention is needed at this time.   CC: Travis Flavin, Travis Padilla        Next Appointment:      Next Appointment: 03/29/2023 11:30 AM    Appointment Type: Surgery     Location: [facility] [phone]    Provider: Sebastian Ache, M.D.    Reason for Visit: NE/OP FIDUCIAL MARKERS AND SPACE OAR    Urology Preoperative H&P   Chief Complaint: Prostate cancer  History of Present Illness: Travis Padilla is a 77 y.o. male with prostate cancer here for fiducial markers and Space OAR injection.    Past Medical History:  Diagnosis Date   Arthritis    Benign localized prostatic hyperplasia with lower urinary tract symptoms (LUTS)    CAD (coronary artery disease) 2013   cardiologist--- dr [name];   08-26-2012 cath for poss ischemia on NUC-- moderate nonob CAD, aggressive medical manage;  staged cardiac cath 09/ 2022 PCI, DES x1 to mLAD (80%) ;   arthrectomy and DES prox-mid RCA Mild nonobstructive plaque in cath 2013   Chronic constipation    COPD with emphysema [facility])    pulmonology--- dr b. icard/ tammy parrett NP;   no oxygen, no daily inhaler   Diverticulosis of colon    w/ hx diverticulitis   Dyslipidemia    GAD (generalized anxiety disorder)    GERD (gastroesophageal reflux disease)    Hemorrhoids    internal and external   History of adenomatous polyp of colon    followed by dr Leone Payor   History of antineoplastic chemotherapy    History of basal cell carcinoma (BCC) excision    2019  s/p MOH's nose   History of GI bleed 05/2021   in setting post op cardiac cath PCI / stenting on blood thinnner/ 05-27-2021 Hg 7.9,  EGD done 05-30-2023  showed erosive gastrophy no active bleeding,  received x3 PRBC and IV iron infusions   History of gout    History of kidney stones    History of palpitations    event monitor--- 11-21-2021  showed infrequent NSVT, ST   History of radiation therapy    pallitive radition to right lung and left axilla 04-30-2018  to 05-21-2018;   enlarging early stage LUL mass SBRT  01-09-2022  to 01-19-2022   Hypertension    Hypothyroidism (acquired)    followed by pcp   IDA (iron deficiency anemia)    Malignant neoplasm prostate [facility]) 01/2023   urologist--- dr /  radiation  onologist--- dr Kathrynn Running;  dx 05/ 2024, gleason 4+3   Metastatic lung cancer (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  other site) (HCC) dx'd 04/17/18   to LN, infrahilar mass, lung nodule and lt axilla   Nephrolithiasis    per CT 02-21-2023 in epic bilateral renal calucli nonobstructive   OSA (obstructive sleep apnea)    sleep study 03-22-2021 in epic , mild osa AHI 8.2/hr  cpap intolerant   Port-A-Cath in place    Squamous cell carcinoma of lung, stage IV, 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   oncologist--- dr Arbutus Ped  radiation onologist-- dr Mitzi Hansen;  dx by needle bx;  Non-small cell lung cancer SCC w/ large right infrahilar mass in addition left upper lobe nodule & left axilla mass w/ left axilla lymph node;  treated w/ pallitive radiation , systemic chemo, maintenance immunotherapy discontinued due to intolerence 03/ 2022    Past Surgical History:  Procedure Laterality Date   BELPHAROPTOSIS REPAIR Right    1990s   CARDIAC CATHETERIZATION     CATARACT EXTRACTION W/ INTRAOCULAR LENS  IMPLANT, BILATERAL  2004   COLONOSCOPY N/A 11/03/2014   Procedure: COLONOSCOPY;  Surgeon: Malissa Hippo, MD;  Location: [facility];  Service: Endoscopy;  Laterality: N/A;  1225   COLONOSCOPY WITH PROPOFOL  02/13/2023   dr Leone Payor   CORONARY ATHERECTOMY N/A 05/26/2021   Procedure: CORONARY ATHERECTOMY;  Surgeon: [name], MD;  Location: [facility];  Service: Cardiovascular;  Laterality: N/A;   CORONARY STENT INTERVENTION N/A 05/25/2021   Procedure: CORONARY STENT INTERVENTION;  Surgeon: [name], MD;  Location: [facility];  Service: Cardiovascular;  Laterality: N/A;   CORONARY STENT INTERVENTION N/A 05/26/2021   Procedure: CORONARY STENT INTERVENTION;  Surgeon: [name], MD;  Location: [facility];  Service: Cardiovascular;  Laterality: N/A;   CORONARY ULTRASOUND/IVUS N/A 05/26/2021   Procedure: Intravascular Ultrasound/IVUS;  Surgeon: [name], MD;  Location: [facility];   Service: Cardiovascular;  Laterality: N/A;   ELBOW BURSA SURGERY Left 11/2005   ESOPHAGOGASTRODUODENOSCOPY (EGD) WITH PROPOFOL N/A 05/29/2021   Procedure: ESOPHAGOGASTRODUODENOSCOPY (EGD) WITH PROPOFOL;  Surgeon: [name], MD;  Location: [facility] ENDOSCOPY;  Service: Endoscopy;  Laterality: N/A;   EXTRACORPOREAL SHOCK WAVE LITHOTRIPSY Right 05/02/2015   @[facility]    IR CV LINE INJECTION  08/24/2020   IR FLUORO GUIDED NEEDLE PLC ASPIRATION/INJECTION LOC  11/28/2020   IR IMAGING GUIDED PORT INSERTION  07/11/2018   KNEE ARTHROSCOPY W/ MENISCAL REPAIR Left 1995   LEFT HEART CATH AND CORONARY ANGIOGRAPHY N/A 05/25/2021   Procedure: LEFT HEART CATH AND CORONARY ANGIOGRAPHY;  Surgeon: [name], MD;  Location: [facility];  Service: Cardiovascular;  Laterality: N/A;   OLECRANON BURSECTOMY Left 06/06/2006   @[facility]  by dr Bea Laura. Renae Fickle;   revision radial left elbow   TEMPORARY PACEMAKER N/A 05/26/2021   Procedure: TEMPORARY PACEMAKER;  Surgeon: [name], MD;  Location: [facility];  Service: Cardiovascular;  Laterality: N/A;   TONSILLECTOMY     child   TRANSURETHRAL RESECTION OF BLADDER TUMOR  02/17/2010   @[facility]  by dr Retta Diones   VIDEO BRONCHOSCOPY Bilateral 05/09/2018   Procedure: VIDEO BRONCHOSCOPY WITH FLUORO;  Surgeon: [name], MD;  Location: [facility] ENDOSCOPY;  Service: Cardiopulmonary;  Laterality: Bilateral;    Allergies:  Allergies  Allergen Reactions   Iodinated Contrast Media Hives and Rash    Pt presented to CT after completed 13 hr premedication regimen and still had an minor rash/hives.    Iohexol Rash    CT scan contrast caused rash and itching  does fine with premeds.   Pravastatin     Numbness in feet     Family History  Problem Relation Age of Onset   Heart failure Mother    Parkinson's disease Mother    Heart attack Father    Healthy Sister    Skin cancer Sister    Healthy Sister    Healthy Sister    Neuropathy Brother    COPD Brother     Epilepsy Brother    Colon cancer Neg Hx    Pancreatic cancer Neg Hx    Prostate cancer Neg Hx    Rectal cancer Neg Hx    Stomach cancer Neg Hx     Social History:  reports that he quit smoking about 52 years ago. His smoking use included cigarettes. He started smoking about 54 years ago. He has a 0.4 pack-year smoking history. He has never used smokeless tobacco. He reports that he does not currently use alcohol. He reports that he does not use drugs.  ROS: A complete review of systems was performed.  All systems are negative except for pertinent findings as noted.  Physical Exam:  Vital signs in last 24 hours: Temp:  [97.8 F (36.6 C)] 97.8 F (36.6 C) (08/07 1111) Pulse Rate:  [81] 81 (08/07 1111) Resp:  [17] 17 (08/07 1111) BP: (115)/(69) 115/69 (08/07 1111) SpO2:  [98 %] 98 % (08/07 1111) Weight:  [107.6 kg] 107.6 kg (08/07 1111) Constitutional:  Alert and oriented, No acute distress Cardiovascular: Regular rate and rhythm Respiratory: Normal respiratory effort, Lungs clear bilaterally GI: Abdomen is soft, nontender, nondistended, no abdominal masses GU: No CVA tenderness Lymphatic: No lymphadenopathy Neurologic: Grossly intact, no focal deficits Psychiatric: Normal mood and affect  Laboratory Data:  No results for input(s): "WBC", "HGB", "HCT", "PLT" in the last 72 hours.  No results for input(s): "NA", "K", "CL", "GLUCOSE", "BUN", "CALCIUM", "CREATININE" in the last 72 hours.  Invalid input(s): "CO3"   No results found. However, due to the size of the patient record, not all encounters were searched. Please check Results Review for a complete set of results. No results found for this or any previous visit (from the past 240 hour(s)).  Renal Function: No results for input(s): "CREATININE" in the last 168 hours. CrCl cannot be calculated (Patient's most recent lab result is older than the maximum 21 days allowed.).  Radiologic Imaging: No results found.  I  independently reviewed the above imaging studies.  Assessment and Plan Travis Padilla is a 77 y.o. male with prostate cancer here for fiducial marker placement and SpaceOar.     Matt R.  MD 04/17/2023, 12:22 PM  [facility] Pager: [phone]

## 2023-04-17 NOTE — Anesthesia Procedure Notes (Signed)
Procedure Name: MAC Date/Time: 04/17/2023 12:34 PM  Performed by: Jessica Priest, CRNAPre-anesthesia Checklist: Timeout performed, Patient being monitored, Suction available, Emergency Drugs available and Patient identified Patient Re-evaluated:Patient Re-evaluated prior to induction Oxygen Delivery Method: Simple face mask Preoxygenation: Pre-oxygenation with 100% oxygen Induction Type: IV induction Placement Confirmation: breath sounds checked- equal and bilateral, positive ETCO2 and CO2 detector

## 2023-04-17 NOTE — Op Note (Signed)
Operative Note  Preoperative diagnosis:  1.  Clinically localized adenocarcinoma of the prostate  Postoperative diagnosis: 1.  Clinically localized adenocarcinoma of the prostate  Procedure(s): 1. Placement of fiducial markers into prostate 2. Insertion of SpaceOAR hydrogel   Surgeon: Jettie Pagan, MD  Assistants:  None  Anesthesia:  General  Complications:  None  EBL:  Minimal  Specimens: 1. None  Drains/Catheters: 1.  None  Indication:  Travis Padilla is a 77 y.o. male with clinically localized prostate cancer. After discussing management options for treatment, he elected to proceed with radiotherapy. He presents today for the above procedures. The potential risks, complications, alternative options, and expected recovery course have been discussed in detail with the patient and he has provided informed consent to proceed.  Description of procedure: The patient was administered preoperative antibiotics, placed in the dorsal lithotomy position, and prepped and draped in the usual sterile fashion. Next, transrectal ultrasonography was utilized to visualize the prostate. Three gold fiducial markers were then placed into the prostate via transperineal needles under ultrasound guidance at the right apex, right base, and left mid gland under direct ultrasound guidance. A site in the midline was then selected on the perineum for placement of an 18 g needle with saline. The needle was advanced above the rectum and below Denonvillier's fascia to the mid gland and confirmed to be in the midline on transverse imaging. One cc of saline was injected confirming appropriate expansion of this space. A total of 5 cc of saline was then injected to open the space further bilaterally. The saline syringe was then removed and the SpaceOAR hydrogel was injected with good distribution bilaterally. He tolerated the procedure well and without complications. He was given a voiding trial prior to discharge from  the PACU.  Matt R.  MD [facility]  Pager: [phone]

## 2023-04-17 NOTE — Anesthesia Postprocedure Evaluation (Signed)
Anesthesia Post Note  Patient: Travis Padilla  Procedure(s) Performed: GOLD SEED IMPLANT (Prostate) SPACE OAR INSTILLATION (Prostate)     Patient location during evaluation: PACU Anesthesia Type: MAC Level of consciousness: awake and alert Pain management: pain level controlled Vital Signs Assessment: post-procedure vital signs reviewed and stable Respiratory status: spontaneous breathing, nonlabored ventilation and respiratory function stable Cardiovascular status: blood pressure returned to baseline Postop Assessment: no apparent nausea or vomiting Anesthetic complications: no   No notable events documented.  Last Vitals:  Vitals:   04/17/23 1315 04/17/23 1330  BP: 106/65 117/75  Pulse: 79 74  Resp: 20 10  Temp:    SpO2: 99% 99%    Last Pain:  Vitals:   04/17/23 1330  TempSrc:   PainSc: 0-No pain                 Shanda Howells

## 2023-04-17 NOTE — Discharge Instructions (Signed)
   No acetaminophen/Tylenol until after 5:20 pm today if needed.   Post Anesthesia Home Care Instructions  Activity: Get plenty of rest for the remainder of the day. A responsible individual must stay with you for 24 hours following the procedure.  For the next 24 hours, DO NOT: -Drive a car -Advertising copywriter -Drink alcoholic beverages -Take any medication unless instructed by your physician -Make any legal decisions or sign important papers.  Meals: Start with liquid foods such as gelatin or soup. Progress to regular foods as tolerated. Avoid greasy, spicy, heavy foods. If nausea and/or vomiting occur, drink only clear liquids until the nausea and/or vomiting subsides. Call your physician if vomiting continues.  Special Instructions/Symptoms: Your throat may feel dry or sore from the anesthesia or the breathing tube placed in your throat during surgery. If this causes discomfort, gargle with warm salt water. The discomfort should disappear within 24 hours.

## 2023-04-18 ENCOUNTER — Encounter (HOSPITAL_BASED_OUTPATIENT_CLINIC_OR_DEPARTMENT_OTHER): Payer: Self-pay | Admitting: Urology

## 2023-04-22 ENCOUNTER — Telehealth: Payer: Self-pay | Admitting: *Deleted

## 2023-04-22 NOTE — Telephone Encounter (Signed)
CALLED PATIENT TO REMIND OF SIM APPT. FOR 04-23-23- ARRIVAL TIME- 12:45 PM @ [facility], INFORMED PATIENT TO ARRIVE WITH A FULL BLADDER, SPOKE WITH PATIENT AND HE IS AWARE OF THIS APPT. AND THE INSTRUCTIONS

## 2023-04-23 ENCOUNTER — Inpatient Hospital Stay: Payer: Medicare Other

## 2023-04-23 ENCOUNTER — Other Ambulatory Visit: Payer: Self-pay

## 2023-04-23 ENCOUNTER — Inpatient Hospital Stay: Payer: Medicare Other | Attending: Internal Medicine

## 2023-04-23 ENCOUNTER — Ambulatory Visit
Admission: RE | Admit: 2023-04-23 | Discharge: 2023-04-23 | Disposition: A | Discharge status: Home / Self Care | Payer: Medicare Other | Source: Ambulatory Visit | Attending: Radiation Oncology | Admitting: Radiation Oncology

## 2023-04-23 DIAGNOSIS — Z191 Hormone sensitive malignancy status: Secondary | ICD-10-CM | POA: Diagnosis not present

## 2023-04-23 DIAGNOSIS — C61 Malignant neoplasm of prostate: Secondary | ICD-10-CM

## 2023-04-23 NOTE — Progress Notes (Signed)
  Radiation Oncology         [phone]) [phone] ________________________________  Name: Travis Padilla MRN: [id number]  Date: 04/23/2023  DOB: Jun 08, 1946  SIMULATION AND TREATMENT PLANNING NOTE    ICD-10-CM   1. Malignant neoplasm of prostate (HCC)  C61       DIAGNOSIS:   77 y.o. gentleman with Stage T1c adenocarcinoma of the prostate with Gleason score of 4+3, and PSA of 5.43.   NARRATIVE:  The patient was brought to the CT Simulation planning suite.  Identity was confirmed.  All relevant records and images related to the planned course of therapy were reviewed.  The patient freely provided informed written consent to proceed with treatment after reviewing the details related to the planned course of therapy. The consent form was witnessed and verified by the simulation staff.  Then, the patient was set-up in a stable reproducible supine position for radiation therapy.  A vacuum lock pillow device was custom fabricated to position his legs in a reproducible immobilized position.  Then, I performed a urethrogram under sterile conditions to identify the prostatic apex.  CT images were obtained.  Surface markings were placed.  The CT images were loaded into the planning software.  Then the prostate target and avoidance structures including the rectum, bladder, bowel and hips were contoured.  Treatment planning then occurred.  The radiation prescription was entered and confirmed.  A total of one complex treatment devices was fabricated. I have requested : Intensity Modulated Radiotherapy (IMRT) is medically necessary for this case for the following reason:  Rectal sparing.Marland Kitchen  PLAN:  The patient will receive 70 Gy in 28 fractions concurrent with ST-ADT (got a 6 month Eligard injection 03/11/23).  ________________________________  Artist Pais Kathrynn Running, M.D.

## 2023-04-29 DIAGNOSIS — Z510 Encounter for antineoplastic radiation therapy: Secondary | ICD-10-CM | POA: Insufficient documentation

## 2023-04-29 DIAGNOSIS — C61 Malignant neoplasm of prostate: Secondary | ICD-10-CM | POA: Insufficient documentation

## 2023-04-29 DIAGNOSIS — Z191 Hormone sensitive malignancy status: Secondary | ICD-10-CM | POA: Diagnosis not present

## 2023-04-30 ENCOUNTER — Inpatient Hospital Stay: Payer: Medicare Other

## 2023-05-01 ENCOUNTER — Other Ambulatory Visit: Payer: Medicare Other

## 2023-05-01 DIAGNOSIS — E039 Hypothyroidism, unspecified: Secondary | ICD-10-CM

## 2023-05-02 ENCOUNTER — Ambulatory Visit: Payer: Medicare Other

## 2023-05-02 LAB — THYROID PANEL WITH TSH
Free Thyroxine Index: 3.3 (ref 1.2–4.9)
T3 Uptake Ratio: 35 % (ref 24–39)
T4, Total: 9.5 ug/dL (ref 4.5–12.0)
TSH: 0.009 u[IU]/mL — ABNORMAL LOW (ref 0.450–4.500)

## 2023-05-03 ENCOUNTER — Other Ambulatory Visit: Payer: Self-pay | Admitting: Family Medicine

## 2023-05-03 ENCOUNTER — Ambulatory Visit: Payer: Medicare Other

## 2023-05-03 DIAGNOSIS — E039 Hypothyroidism, unspecified: Secondary | ICD-10-CM

## 2023-05-03 MED ORDER — LEVOTHYROXINE SODIUM 150 MCG PO TABS
150.0000 ug | ORAL_TABLET | Freq: Every day | ORAL | 0 refills | Status: DC
Start: 2023-05-03 — End: 2023-07-29

## 2023-05-03 NOTE — Progress Notes (Signed)
Attempted to call pt no answer left vm for cb  

## 2023-05-03 NOTE — Progress Notes (Signed)
New dose sent. Future orders for thyroid placed. Please make sure he has 6-8 week lab appt scheduled.  Orders Placed This Encounter  Procedures   TSH    Standing Status:   Future    Standing Expiration Date:   05/02/2024   T4, free    Standing Status:   Future    Standing Expiration Date:   05/02/2024   Meds ordered this encounter  Medications   levothyroxine (SYNTHROID) 150 MCG tablet    Sig: Take 1 tablet (150 mcg total) by mouth daily.    Dispense:  90 tablet    Refill:  0

## 2023-05-05 ENCOUNTER — Other Ambulatory Visit: Payer: Self-pay | Admitting: Cardiology

## 2023-05-06 ENCOUNTER — Ambulatory Visit: Admission: RE | Admit: 2023-05-06 | Payer: Medicare Other | Source: Ambulatory Visit

## 2023-05-06 ENCOUNTER — Other Ambulatory Visit: Payer: Self-pay

## 2023-05-06 DIAGNOSIS — Z510 Encounter for antineoplastic radiation therapy: Secondary | ICD-10-CM | POA: Diagnosis not present

## 2023-05-06 DIAGNOSIS — C61 Malignant neoplasm of prostate: Secondary | ICD-10-CM | POA: Diagnosis not present

## 2023-05-06 DIAGNOSIS — Z191 Hormone sensitive malignancy status: Secondary | ICD-10-CM | POA: Diagnosis not present

## 2023-05-06 LAB — RAD ONC ARIA SESSION SUMMARY
Course Elapsed Days: 0
Plan Fractions Treated to Date: 1
Plan Prescribed Dose Per Fraction: 2.5 Gy
Plan Total Fractions Prescribed: 28
Plan Total Prescribed Dose: 70 Gy
Reference Point Dosage Given to Date: 2.5 Gy
Reference Point Session Dosage Given: 2.5 Gy
Session Number: 1

## 2023-05-07 ENCOUNTER — Other Ambulatory Visit: Payer: Self-pay

## 2023-05-07 ENCOUNTER — Ambulatory Visit
Admission: RE | Admit: 2023-05-07 | Discharge: 2023-05-07 | Disposition: A | Discharge status: Home / Self Care | Payer: Medicare Other | Source: Ambulatory Visit | Attending: Radiation Oncology | Admitting: Radiation Oncology

## 2023-05-07 DIAGNOSIS — Z510 Encounter for antineoplastic radiation therapy: Secondary | ICD-10-CM | POA: Diagnosis not present

## 2023-05-07 DIAGNOSIS — C61 Malignant neoplasm of prostate: Secondary | ICD-10-CM | POA: Diagnosis not present

## 2023-05-07 DIAGNOSIS — Z191 Hormone sensitive malignancy status: Secondary | ICD-10-CM | POA: Diagnosis not present

## 2023-05-07 LAB — RAD ONC ARIA SESSION SUMMARY
Course Elapsed Days: 1
Plan Fractions Treated to Date: 2
Plan Prescribed Dose Per Fraction: 2.5 Gy
Plan Total Fractions Prescribed: 28
Plan Total Prescribed Dose: 70 Gy
Reference Point Dosage Given to Date: 5 Gy
Reference Point Session Dosage Given: 2.5 Gy
Session Number: 2

## 2023-05-07 NOTE — Progress Notes (Signed)
Patient aware, appointment schedule to come back for lab work.

## 2023-05-08 ENCOUNTER — Ambulatory Visit: Admission: RE | Admit: 2023-05-08 | Payer: Medicare Other | Source: Ambulatory Visit

## 2023-05-08 ENCOUNTER — Other Ambulatory Visit: Payer: Self-pay

## 2023-05-08 DIAGNOSIS — Z191 Hormone sensitive malignancy status: Secondary | ICD-10-CM | POA: Diagnosis not present

## 2023-05-08 DIAGNOSIS — C61 Malignant neoplasm of prostate: Secondary | ICD-10-CM | POA: Diagnosis not present

## 2023-05-08 DIAGNOSIS — Z510 Encounter for antineoplastic radiation therapy: Secondary | ICD-10-CM | POA: Diagnosis not present

## 2023-05-08 LAB — RAD ONC ARIA SESSION SUMMARY
Course Elapsed Days: 2
Plan Fractions Treated to Date: 3
Plan Prescribed Dose Per Fraction: 2.5 Gy
Plan Total Fractions Prescribed: 28
Plan Total Prescribed Dose: 70 Gy
Reference Point Dosage Given to Date: 7.5 Gy
Reference Point Session Dosage Given: 2.5 Gy
Session Number: 3

## 2023-05-09 ENCOUNTER — Ambulatory Visit
Admission: RE | Admit: 2023-05-09 | Discharge: 2023-05-09 | Disposition: A | Discharge status: Home / Self Care | Payer: Medicare Other | Source: Ambulatory Visit | Attending: Radiation Oncology | Admitting: Radiation Oncology

## 2023-05-09 ENCOUNTER — Other Ambulatory Visit: Payer: Self-pay

## 2023-05-09 DIAGNOSIS — C61 Malignant neoplasm of prostate: Secondary | ICD-10-CM | POA: Diagnosis not present

## 2023-05-09 DIAGNOSIS — Z510 Encounter for antineoplastic radiation therapy: Secondary | ICD-10-CM | POA: Diagnosis not present

## 2023-05-09 DIAGNOSIS — Z191 Hormone sensitive malignancy status: Secondary | ICD-10-CM | POA: Diagnosis not present

## 2023-05-09 LAB — RAD ONC ARIA SESSION SUMMARY
Course Elapsed Days: 3
Plan Fractions Treated to Date: 4
Plan Prescribed Dose Per Fraction: 2.5 Gy
Plan Total Fractions Prescribed: 28
Plan Total Prescribed Dose: 70 Gy
Reference Point Dosage Given to Date: 10 Gy
Reference Point Session Dosage Given: 2.5 Gy
Session Number: 4

## 2023-05-10 ENCOUNTER — Other Ambulatory Visit: Payer: Self-pay

## 2023-05-10 ENCOUNTER — Ambulatory Visit
Admission: RE | Admit: 2023-05-10 | Discharge: 2023-05-10 | Disposition: A | Discharge status: Home / Self Care | Payer: Medicare Other | Source: Ambulatory Visit | Attending: Radiation Oncology | Admitting: Radiation Oncology

## 2023-05-10 DIAGNOSIS — C61 Malignant neoplasm of prostate: Secondary | ICD-10-CM | POA: Diagnosis not present

## 2023-05-10 DIAGNOSIS — Z510 Encounter for antineoplastic radiation therapy: Secondary | ICD-10-CM | POA: Diagnosis not present

## 2023-05-10 DIAGNOSIS — Z191 Hormone sensitive malignancy status: Secondary | ICD-10-CM | POA: Diagnosis not present

## 2023-05-10 LAB — RAD ONC ARIA SESSION SUMMARY
Course Elapsed Days: 4
Plan Fractions Treated to Date: 5
Plan Prescribed Dose Per Fraction: 2.5 Gy
Plan Total Fractions Prescribed: 28
Plan Total Prescribed Dose: 70 Gy
Reference Point Dosage Given to Date: 12.5 Gy
Reference Point Session Dosage Given: 2.5 Gy
Session Number: 5

## 2023-05-14 ENCOUNTER — Ambulatory Visit
Admission: RE | Admit: 2023-05-14 | Discharge: 2023-05-14 | Disposition: A | Discharge status: Home / Self Care | Payer: Medicare Other | Source: Ambulatory Visit | Attending: Radiation Oncology | Admitting: Radiation Oncology

## 2023-05-14 ENCOUNTER — Other Ambulatory Visit: Payer: Self-pay

## 2023-05-14 DIAGNOSIS — Z191 Hormone sensitive malignancy status: Secondary | ICD-10-CM | POA: Diagnosis not present

## 2023-05-14 DIAGNOSIS — C61 Malignant neoplasm of prostate: Secondary | ICD-10-CM | POA: Insufficient documentation

## 2023-05-14 DIAGNOSIS — Z510 Encounter for antineoplastic radiation therapy: Secondary | ICD-10-CM | POA: Insufficient documentation

## 2023-05-14 LAB — RAD ONC ARIA SESSION SUMMARY
Course Elapsed Days: 8
Plan Fractions Treated to Date: 6
Plan Prescribed Dose Per Fraction: 2.5 Gy
Plan Total Fractions Prescribed: 28
Plan Total Prescribed Dose: 70 Gy
Reference Point Dosage Given to Date: 15 Gy
Reference Point Session Dosage Given: 2.5 Gy
Session Number: 6

## 2023-05-15 ENCOUNTER — Other Ambulatory Visit: Payer: Self-pay

## 2023-05-15 ENCOUNTER — Ambulatory Visit
Admission: RE | Admit: 2023-05-15 | Discharge: 2023-05-15 | Disposition: A | Discharge status: Home / Self Care | Payer: Medicare Other | Source: Ambulatory Visit | Attending: Radiation Oncology | Admitting: Radiation Oncology

## 2023-05-15 DIAGNOSIS — Z191 Hormone sensitive malignancy status: Secondary | ICD-10-CM | POA: Diagnosis not present

## 2023-05-15 DIAGNOSIS — C61 Malignant neoplasm of prostate: Secondary | ICD-10-CM | POA: Diagnosis not present

## 2023-05-15 DIAGNOSIS — Z510 Encounter for antineoplastic radiation therapy: Secondary | ICD-10-CM | POA: Diagnosis not present

## 2023-05-15 LAB — RAD ONC ARIA SESSION SUMMARY
Course Elapsed Days: 9
Plan Fractions Treated to Date: 7
Plan Prescribed Dose Per Fraction: 2.5 Gy
Plan Total Fractions Prescribed: 28
Plan Total Prescribed Dose: 70 Gy
Reference Point Dosage Given to Date: 17.5 Gy
Reference Point Session Dosage Given: 2.5 Gy
Session Number: 7

## 2023-05-16 ENCOUNTER — Other Ambulatory Visit: Payer: Self-pay

## 2023-05-16 ENCOUNTER — Ambulatory Visit
Admission: RE | Admit: 2023-05-16 | Discharge: 2023-05-16 | Disposition: A | Discharge status: Home / Self Care | Payer: Medicare Other | Source: Ambulatory Visit | Attending: Radiation Oncology | Admitting: Radiation Oncology

## 2023-05-16 DIAGNOSIS — Z191 Hormone sensitive malignancy status: Secondary | ICD-10-CM | POA: Diagnosis not present

## 2023-05-16 DIAGNOSIS — Z510 Encounter for antineoplastic radiation therapy: Secondary | ICD-10-CM | POA: Diagnosis not present

## 2023-05-16 DIAGNOSIS — C61 Malignant neoplasm of prostate: Secondary | ICD-10-CM | POA: Diagnosis not present

## 2023-05-16 LAB — RAD ONC ARIA SESSION SUMMARY
Course Elapsed Days: 10
Plan Fractions Treated to Date: 8
Plan Prescribed Dose Per Fraction: 2.5 Gy
Plan Total Fractions Prescribed: 28
Plan Total Prescribed Dose: 70 Gy
Reference Point Dosage Given to Date: 20 Gy
Reference Point Session Dosage Given: 2.5 Gy
Session Number: 8

## 2023-05-17 ENCOUNTER — Ambulatory Visit
Admission: RE | Admit: 2023-05-17 | Discharge: 2023-05-17 | Disposition: A | Discharge status: Home / Self Care | Payer: Medicare Other | Source: Ambulatory Visit | Attending: Radiation Oncology | Admitting: Radiation Oncology

## 2023-05-17 ENCOUNTER — Other Ambulatory Visit: Payer: Self-pay

## 2023-05-17 ENCOUNTER — Ambulatory Visit
Admission: RE | Admit: 2023-05-17 | Discharge: 2023-05-17 | Disposition: A | Discharge status: Home / Self Care | Payer: Medicare Other | Source: Ambulatory Visit | Attending: Radiation Oncology

## 2023-05-17 DIAGNOSIS — C61 Malignant neoplasm of prostate: Secondary | ICD-10-CM | POA: Diagnosis not present

## 2023-05-17 DIAGNOSIS — Z510 Encounter for antineoplastic radiation therapy: Secondary | ICD-10-CM | POA: Diagnosis not present

## 2023-05-17 DIAGNOSIS — Z191 Hormone sensitive malignancy status: Secondary | ICD-10-CM | POA: Diagnosis not present

## 2023-05-17 LAB — RAD ONC ARIA SESSION SUMMARY
Course Elapsed Days: 11
Course Elapsed Days: 11
Plan Fractions Treated to Date: 9
Plan Fractions Treated to Date: 10
Plan Prescribed Dose Per Fraction: 2.5 Gy
Plan Prescribed Dose Per Fraction: 2.5 Gy
Plan Total Fractions Prescribed: 28
Plan Total Fractions Prescribed: 28
Plan Total Prescribed Dose: 70 Gy
Plan Total Prescribed Dose: 70 Gy
Reference Point Dosage Given to Date: 22.5 Gy
Reference Point Dosage Given to Date: 25 Gy
Reference Point Session Dosage Given: 2.5 Gy
Reference Point Session Dosage Given: 2.5 Gy
Session Number: 9
Session Number: 10

## 2023-05-20 ENCOUNTER — Ambulatory Visit
Admission: RE | Admit: 2023-05-20 | Discharge: 2023-05-20 | Disposition: A | Discharge status: Home / Self Care | Payer: Medicare Other | Source: Ambulatory Visit | Attending: Radiation Oncology

## 2023-05-20 ENCOUNTER — Other Ambulatory Visit: Payer: Self-pay

## 2023-05-20 DIAGNOSIS — Z510 Encounter for antineoplastic radiation therapy: Secondary | ICD-10-CM | POA: Diagnosis not present

## 2023-05-20 DIAGNOSIS — Z191 Hormone sensitive malignancy status: Secondary | ICD-10-CM | POA: Diagnosis not present

## 2023-05-20 DIAGNOSIS — C61 Malignant neoplasm of prostate: Secondary | ICD-10-CM | POA: Diagnosis not present

## 2023-05-20 LAB — RAD ONC ARIA SESSION SUMMARY
Course Elapsed Days: 14
Plan Fractions Treated to Date: 11
Plan Prescribed Dose Per Fraction: 2.5 Gy
Plan Total Fractions Prescribed: 28
Plan Total Prescribed Dose: 70 Gy
Reference Point Dosage Given to Date: 27.5 Gy
Reference Point Session Dosage Given: 2.5 Gy
Session Number: 11

## 2023-05-21 ENCOUNTER — Ambulatory Visit
Admission: RE | Admit: 2023-05-21 | Discharge: 2023-05-21 | Disposition: A | Discharge status: Home / Self Care | Payer: Medicare Other | Source: Ambulatory Visit | Attending: Radiation Oncology | Admitting: Radiation Oncology

## 2023-05-21 ENCOUNTER — Other Ambulatory Visit: Payer: Self-pay

## 2023-05-21 DIAGNOSIS — C61 Malignant neoplasm of prostate: Secondary | ICD-10-CM | POA: Diagnosis not present

## 2023-05-21 DIAGNOSIS — Z510 Encounter for antineoplastic radiation therapy: Secondary | ICD-10-CM | POA: Diagnosis not present

## 2023-05-21 DIAGNOSIS — Z191 Hormone sensitive malignancy status: Secondary | ICD-10-CM | POA: Diagnosis not present

## 2023-05-21 LAB — RAD ONC ARIA SESSION SUMMARY
Course Elapsed Days: 15
Plan Fractions Treated to Date: 12
Plan Prescribed Dose Per Fraction: 2.5 Gy
Plan Total Fractions Prescribed: 28
Plan Total Prescribed Dose: 70 Gy
Reference Point Dosage Given to Date: 30 Gy
Reference Point Session Dosage Given: 2.5 Gy
Session Number: 12

## 2023-05-22 ENCOUNTER — Other Ambulatory Visit: Payer: Self-pay

## 2023-05-22 ENCOUNTER — Ambulatory Visit
Admission: RE | Admit: 2023-05-22 | Discharge: 2023-05-22 | Disposition: A | Discharge status: Home / Self Care | Payer: Medicare Other | Source: Ambulatory Visit | Attending: Radiation Oncology

## 2023-05-22 ENCOUNTER — Ambulatory Visit
Admission: RE | Admit: 2023-05-22 | Discharge: 2023-05-22 | Disposition: A | Discharge status: Home / Self Care | Payer: Medicare Other | Source: Ambulatory Visit | Attending: Radiation Oncology | Admitting: Radiation Oncology

## 2023-05-22 DIAGNOSIS — Z510 Encounter for antineoplastic radiation therapy: Secondary | ICD-10-CM | POA: Diagnosis not present

## 2023-05-22 DIAGNOSIS — C61 Malignant neoplasm of prostate: Secondary | ICD-10-CM | POA: Diagnosis not present

## 2023-05-22 DIAGNOSIS — Z191 Hormone sensitive malignancy status: Secondary | ICD-10-CM | POA: Diagnosis not present

## 2023-05-22 LAB — RAD ONC ARIA SESSION SUMMARY
Course Elapsed Days: 16
Plan Fractions Treated to Date: 13
Plan Prescribed Dose Per Fraction: 2.5 Gy
Plan Total Fractions Prescribed: 28
Plan Total Prescribed Dose: 70 Gy
Reference Point Dosage Given to Date: 32.5 Gy
Reference Point Session Dosage Given: 2.5 Gy
Session Number: 13

## 2023-05-23 ENCOUNTER — Other Ambulatory Visit: Payer: Self-pay

## 2023-05-23 ENCOUNTER — Ambulatory Visit
Admission: RE | Admit: 2023-05-23 | Discharge: 2023-05-23 | Disposition: A | Discharge status: Home / Self Care | Payer: Medicare Other | Source: Ambulatory Visit | Attending: Radiation Oncology | Admitting: Radiation Oncology

## 2023-05-23 DIAGNOSIS — Z191 Hormone sensitive malignancy status: Secondary | ICD-10-CM | POA: Diagnosis not present

## 2023-05-23 DIAGNOSIS — C61 Malignant neoplasm of prostate: Secondary | ICD-10-CM | POA: Diagnosis not present

## 2023-05-23 DIAGNOSIS — Z510 Encounter for antineoplastic radiation therapy: Secondary | ICD-10-CM | POA: Diagnosis not present

## 2023-05-23 LAB — RAD ONC ARIA SESSION SUMMARY
Course Elapsed Days: 17
Plan Fractions Treated to Date: 14
Plan Prescribed Dose Per Fraction: 2.5 Gy
Plan Total Fractions Prescribed: 28
Plan Total Prescribed Dose: 70 Gy
Reference Point Dosage Given to Date: 35 Gy
Reference Point Session Dosage Given: 2.5 Gy
Session Number: 14

## 2023-05-24 ENCOUNTER — Ambulatory Visit
Admission: RE | Admit: 2023-05-24 | Discharge: 2023-05-24 | Disposition: A | Discharge status: Home / Self Care | Payer: Medicare Other | Source: Ambulatory Visit | Attending: Radiation Oncology

## 2023-05-24 ENCOUNTER — Other Ambulatory Visit: Payer: Self-pay

## 2023-05-24 DIAGNOSIS — Z510 Encounter for antineoplastic radiation therapy: Secondary | ICD-10-CM | POA: Diagnosis not present

## 2023-05-24 DIAGNOSIS — C61 Malignant neoplasm of prostate: Secondary | ICD-10-CM | POA: Diagnosis not present

## 2023-05-24 DIAGNOSIS — Z191 Hormone sensitive malignancy status: Secondary | ICD-10-CM | POA: Diagnosis not present

## 2023-05-24 LAB — RAD ONC ARIA SESSION SUMMARY
Course Elapsed Days: 18
Course Elapsed Days: 18
Plan Fractions Treated to Date: 15
Plan Fractions Treated to Date: 16
Plan Prescribed Dose Per Fraction: 2.5 Gy
Plan Prescribed Dose Per Fraction: 2.5 Gy
Plan Total Fractions Prescribed: 28
Plan Total Fractions Prescribed: 28
Plan Total Prescribed Dose: 70 Gy
Plan Total Prescribed Dose: 70 Gy
Reference Point Dosage Given to Date: 37.5 Gy
Reference Point Dosage Given to Date: 40 Gy
Reference Point Session Dosage Given: 2.5 Gy
Reference Point Session Dosage Given: 2.5 Gy
Session Number: 15
Session Number: 16

## 2023-05-27 ENCOUNTER — Other Ambulatory Visit: Payer: Self-pay

## 2023-05-27 ENCOUNTER — Ambulatory Visit
Admission: RE | Admit: 2023-05-27 | Discharge: 2023-05-27 | Disposition: A | Discharge status: Home / Self Care | Payer: Medicare Other | Source: Ambulatory Visit | Attending: Radiation Oncology

## 2023-05-27 DIAGNOSIS — C61 Malignant neoplasm of prostate: Secondary | ICD-10-CM | POA: Diagnosis not present

## 2023-05-27 DIAGNOSIS — Z191 Hormone sensitive malignancy status: Secondary | ICD-10-CM | POA: Diagnosis not present

## 2023-05-27 DIAGNOSIS — Z510 Encounter for antineoplastic radiation therapy: Secondary | ICD-10-CM | POA: Diagnosis not present

## 2023-05-27 LAB — RAD ONC ARIA SESSION SUMMARY
Course Elapsed Days: 21
Plan Fractions Treated to Date: 17
Plan Prescribed Dose Per Fraction: 2.5 Gy
Plan Total Fractions Prescribed: 28
Plan Total Prescribed Dose: 70 Gy
Reference Point Dosage Given to Date: 42.5 Gy
Reference Point Session Dosage Given: 2.5 Gy
Session Number: 17

## 2023-05-28 ENCOUNTER — Ambulatory Visit
Admission: RE | Admit: 2023-05-28 | Discharge: 2023-05-28 | Disposition: A | Discharge status: Home / Self Care | Payer: Medicare Other | Source: Ambulatory Visit | Attending: Radiation Oncology | Admitting: Radiation Oncology

## 2023-05-28 ENCOUNTER — Other Ambulatory Visit: Payer: Self-pay

## 2023-05-28 DIAGNOSIS — C61 Malignant neoplasm of prostate: Secondary | ICD-10-CM | POA: Diagnosis not present

## 2023-05-28 DIAGNOSIS — Z191 Hormone sensitive malignancy status: Secondary | ICD-10-CM | POA: Diagnosis not present

## 2023-05-28 DIAGNOSIS — Z510 Encounter for antineoplastic radiation therapy: Secondary | ICD-10-CM | POA: Diagnosis not present

## 2023-05-28 LAB — RAD ONC ARIA SESSION SUMMARY
Course Elapsed Days: 22
Plan Fractions Treated to Date: 18
Plan Prescribed Dose Per Fraction: 2.5 Gy
Plan Total Fractions Prescribed: 28
Plan Total Prescribed Dose: 70 Gy
Reference Point Dosage Given to Date: 45 Gy
Reference Point Session Dosage Given: 2.5 Gy
Session Number: 18

## 2023-05-29 ENCOUNTER — Ambulatory Visit
Admission: RE | Admit: 2023-05-29 | Discharge: 2023-05-29 | Disposition: A | Discharge status: Home / Self Care | Payer: Medicare Other | Source: Ambulatory Visit | Attending: Radiation Oncology

## 2023-05-29 ENCOUNTER — Other Ambulatory Visit: Payer: Self-pay

## 2023-05-29 DIAGNOSIS — C61 Malignant neoplasm of prostate: Secondary | ICD-10-CM | POA: Diagnosis not present

## 2023-05-29 DIAGNOSIS — Z510 Encounter for antineoplastic radiation therapy: Secondary | ICD-10-CM | POA: Diagnosis not present

## 2023-05-29 DIAGNOSIS — Z191 Hormone sensitive malignancy status: Secondary | ICD-10-CM | POA: Diagnosis not present

## 2023-05-29 LAB — RAD ONC ARIA SESSION SUMMARY
Course Elapsed Days: 23
Plan Fractions Treated to Date: 19
Plan Prescribed Dose Per Fraction: 2.5 Gy
Plan Total Fractions Prescribed: 28
Plan Total Prescribed Dose: 70 Gy
Reference Point Dosage Given to Date: 47.5 Gy
Reference Point Session Dosage Given: 2.5 Gy
Session Number: 19

## 2023-05-30 ENCOUNTER — Other Ambulatory Visit: Payer: Self-pay

## 2023-05-30 ENCOUNTER — Ambulatory Visit
Admission: RE | Admit: 2023-05-30 | Discharge: 2023-05-30 | Disposition: A | Discharge status: Home / Self Care | Payer: Medicare Other | Source: Ambulatory Visit | Attending: Radiation Oncology | Admitting: Radiation Oncology

## 2023-05-30 ENCOUNTER — Ambulatory Visit: Payer: Medicare Other

## 2023-05-30 DIAGNOSIS — Z191 Hormone sensitive malignancy status: Secondary | ICD-10-CM | POA: Diagnosis not present

## 2023-05-30 DIAGNOSIS — C61 Malignant neoplasm of prostate: Secondary | ICD-10-CM | POA: Diagnosis not present

## 2023-05-30 DIAGNOSIS — Z510 Encounter for antineoplastic radiation therapy: Secondary | ICD-10-CM | POA: Diagnosis not present

## 2023-05-30 LAB — RAD ONC ARIA SESSION SUMMARY
Course Elapsed Days: 24
Plan Fractions Treated to Date: 20
Plan Prescribed Dose Per Fraction: 2.5 Gy
Plan Total Fractions Prescribed: 28
Plan Total Prescribed Dose: 70 Gy
Reference Point Dosage Given to Date: 50 Gy
Reference Point Session Dosage Given: 2.5 Gy
Session Number: 20

## 2023-05-31 ENCOUNTER — Other Ambulatory Visit: Payer: Self-pay

## 2023-05-31 ENCOUNTER — Ambulatory Visit
Admission: RE | Admit: 2023-05-31 | Discharge: 2023-05-31 | Disposition: A | Discharge status: Home / Self Care | Payer: Medicare Other | Source: Ambulatory Visit | Attending: Radiation Oncology | Admitting: Radiation Oncology

## 2023-05-31 DIAGNOSIS — Z510 Encounter for antineoplastic radiation therapy: Secondary | ICD-10-CM | POA: Diagnosis not present

## 2023-05-31 DIAGNOSIS — Z191 Hormone sensitive malignancy status: Secondary | ICD-10-CM | POA: Diagnosis not present

## 2023-05-31 DIAGNOSIS — C61 Malignant neoplasm of prostate: Secondary | ICD-10-CM | POA: Diagnosis not present

## 2023-05-31 LAB — RAD ONC ARIA SESSION SUMMARY
Course Elapsed Days: 25
Course Elapsed Days: 25
Plan Fractions Treated to Date: 21
Plan Fractions Treated to Date: 22
Plan Prescribed Dose Per Fraction: 2.5 Gy
Plan Prescribed Dose Per Fraction: 2.5 Gy
Plan Total Fractions Prescribed: 28
Plan Total Fractions Prescribed: 28
Plan Total Prescribed Dose: 70 Gy
Plan Total Prescribed Dose: 70 Gy
Reference Point Dosage Given to Date: 52.5 Gy
Reference Point Dosage Given to Date: 55 Gy
Reference Point Session Dosage Given: 2.5 Gy
Reference Point Session Dosage Given: 2.5 Gy
Session Number: 21
Session Number: 22

## 2023-05-31 NOTE — Progress Notes (Signed)
Patient came over to clinic after afternoon treatment had complaints of blood in stool after each BM.  RN advised patient to take lubricated preparation -H suppositories for his inflamed rectum, and wipe with baby wipes instead of toilet paper for awhile until area is feeling better.  Denied constipation is having soft stools.

## 2023-06-03 ENCOUNTER — Ambulatory Visit
Admission: RE | Admit: 2023-06-03 | Discharge: 2023-06-03 | Disposition: A | Discharge status: Home / Self Care | Payer: Medicare Other | Source: Ambulatory Visit | Attending: Radiation Oncology

## 2023-06-03 ENCOUNTER — Other Ambulatory Visit: Payer: Self-pay

## 2023-06-03 DIAGNOSIS — C61 Malignant neoplasm of prostate: Secondary | ICD-10-CM | POA: Diagnosis not present

## 2023-06-03 DIAGNOSIS — Z191 Hormone sensitive malignancy status: Secondary | ICD-10-CM | POA: Diagnosis not present

## 2023-06-03 DIAGNOSIS — Z510 Encounter for antineoplastic radiation therapy: Secondary | ICD-10-CM | POA: Diagnosis not present

## 2023-06-03 LAB — RAD ONC ARIA SESSION SUMMARY
Course Elapsed Days: 28
Plan Fractions Treated to Date: 23
Plan Prescribed Dose Per Fraction: 2.5 Gy
Plan Total Fractions Prescribed: 28
Plan Total Prescribed Dose: 70 Gy
Reference Point Dosage Given to Date: 57.5 Gy
Reference Point Session Dosage Given: 2.5 Gy
Session Number: 23

## 2023-06-04 ENCOUNTER — Other Ambulatory Visit: Payer: Self-pay

## 2023-06-04 ENCOUNTER — Ambulatory Visit
Admission: RE | Admit: 2023-06-04 | Discharge: 2023-06-04 | Disposition: A | Discharge status: Home / Self Care | Payer: Medicare Other | Source: Ambulatory Visit | Attending: Radiation Oncology | Admitting: Radiation Oncology

## 2023-06-04 DIAGNOSIS — Z510 Encounter for antineoplastic radiation therapy: Secondary | ICD-10-CM | POA: Diagnosis not present

## 2023-06-04 DIAGNOSIS — C61 Malignant neoplasm of prostate: Secondary | ICD-10-CM | POA: Diagnosis not present

## 2023-06-04 DIAGNOSIS — Z191 Hormone sensitive malignancy status: Secondary | ICD-10-CM | POA: Diagnosis not present

## 2023-06-04 LAB — RAD ONC ARIA SESSION SUMMARY
Course Elapsed Days: 29
Plan Fractions Treated to Date: 24
Plan Prescribed Dose Per Fraction: 2.5 Gy
Plan Total Fractions Prescribed: 28
Plan Total Prescribed Dose: 70 Gy
Reference Point Dosage Given to Date: 60 Gy
Reference Point Session Dosage Given: 2.5 Gy
Session Number: 24

## 2023-06-05 ENCOUNTER — Ambulatory Visit: Payer: Medicare Other

## 2023-06-05 ENCOUNTER — Other Ambulatory Visit: Payer: Self-pay

## 2023-06-05 ENCOUNTER — Ambulatory Visit
Admission: RE | Admit: 2023-06-05 | Discharge: 2023-06-05 | Disposition: A | Discharge status: Home / Self Care | Payer: Medicare Other | Source: Ambulatory Visit | Attending: Radiation Oncology

## 2023-06-05 DIAGNOSIS — L821 Other seborrheic keratosis: Secondary | ICD-10-CM | POA: Diagnosis not present

## 2023-06-05 DIAGNOSIS — Z191 Hormone sensitive malignancy status: Secondary | ICD-10-CM | POA: Diagnosis not present

## 2023-06-05 DIAGNOSIS — L570 Actinic keratosis: Secondary | ICD-10-CM | POA: Diagnosis not present

## 2023-06-05 DIAGNOSIS — C61 Malignant neoplasm of prostate: Secondary | ICD-10-CM | POA: Diagnosis not present

## 2023-06-05 DIAGNOSIS — Z510 Encounter for antineoplastic radiation therapy: Secondary | ICD-10-CM | POA: Diagnosis not present

## 2023-06-05 DIAGNOSIS — L820 Inflamed seborrheic keratosis: Secondary | ICD-10-CM | POA: Diagnosis not present

## 2023-06-05 DIAGNOSIS — L578 Other skin changes due to chronic exposure to nonionizing radiation: Secondary | ICD-10-CM | POA: Diagnosis not present

## 2023-06-05 LAB — RAD ONC ARIA SESSION SUMMARY
Course Elapsed Days: 30
Plan Fractions Treated to Date: 25
Plan Prescribed Dose Per Fraction: 2.5 Gy
Plan Total Fractions Prescribed: 28
Plan Total Prescribed Dose: 70 Gy
Reference Point Dosage Given to Date: 62.5 Gy
Reference Point Session Dosage Given: 2.5 Gy
Session Number: 25

## 2023-06-06 ENCOUNTER — Ambulatory Visit
Admission: RE | Admit: 2023-06-06 | Discharge: 2023-06-06 | Disposition: A | Discharge status: Home / Self Care | Payer: Medicare Other | Source: Ambulatory Visit | Attending: Radiation Oncology | Admitting: Radiation Oncology

## 2023-06-06 ENCOUNTER — Other Ambulatory Visit: Payer: Self-pay

## 2023-06-06 ENCOUNTER — Ambulatory Visit: Payer: Medicare Other

## 2023-06-06 DIAGNOSIS — C61 Malignant neoplasm of prostate: Secondary | ICD-10-CM | POA: Diagnosis not present

## 2023-06-06 DIAGNOSIS — Z191 Hormone sensitive malignancy status: Secondary | ICD-10-CM | POA: Diagnosis not present

## 2023-06-06 DIAGNOSIS — Z510 Encounter for antineoplastic radiation therapy: Secondary | ICD-10-CM | POA: Diagnosis not present

## 2023-06-06 LAB — RAD ONC ARIA SESSION SUMMARY
Course Elapsed Days: 31
Plan Fractions Treated to Date: 26
Plan Prescribed Dose Per Fraction: 2.5 Gy
Plan Total Fractions Prescribed: 28
Plan Total Prescribed Dose: 70 Gy
Reference Point Dosage Given to Date: 65 Gy
Reference Point Session Dosage Given: 2.5 Gy
Session Number: 26

## 2023-06-07 ENCOUNTER — Other Ambulatory Visit: Payer: Self-pay

## 2023-06-07 ENCOUNTER — Ambulatory Visit
Admission: RE | Admit: 2023-06-07 | Discharge: 2023-06-07 | Disposition: A | Discharge status: Home / Self Care | Payer: Medicare Other | Source: Ambulatory Visit | Attending: Radiation Oncology | Admitting: Radiation Oncology

## 2023-06-07 ENCOUNTER — Ambulatory Visit
Admission: RE | Admit: 2023-06-07 | Discharge: 2023-06-07 | Disposition: A | Discharge status: Home / Self Care | Payer: Medicare Other | Source: Ambulatory Visit | Attending: Radiation Oncology

## 2023-06-07 ENCOUNTER — Ambulatory Visit: Payer: Medicare Other

## 2023-06-07 DIAGNOSIS — Z191 Hormone sensitive malignancy status: Secondary | ICD-10-CM | POA: Diagnosis not present

## 2023-06-07 DIAGNOSIS — Z510 Encounter for antineoplastic radiation therapy: Secondary | ICD-10-CM | POA: Diagnosis not present

## 2023-06-07 DIAGNOSIS — C61 Malignant neoplasm of prostate: Secondary | ICD-10-CM | POA: Diagnosis not present

## 2023-06-07 LAB — RAD ONC ARIA SESSION SUMMARY
Course Elapsed Days: 32
Plan Fractions Treated to Date: 27
Plan Prescribed Dose Per Fraction: 2.5 Gy
Plan Total Fractions Prescribed: 28
Plan Total Prescribed Dose: 70 Gy
Reference Point Dosage Given to Date: 67.5 Gy
Reference Point Session Dosage Given: 2.5 Gy
Session Number: 27

## 2023-06-10 ENCOUNTER — Ambulatory Visit
Admission: RE | Admit: 2023-06-10 | Discharge: 2023-06-10 | Disposition: A | Discharge status: Home / Self Care | Payer: Medicare Other | Source: Ambulatory Visit | Attending: Radiation Oncology | Admitting: Radiation Oncology

## 2023-06-10 ENCOUNTER — Other Ambulatory Visit: Payer: Self-pay

## 2023-06-10 DIAGNOSIS — Z191 Hormone sensitive malignancy status: Secondary | ICD-10-CM | POA: Diagnosis not present

## 2023-06-10 DIAGNOSIS — C61 Malignant neoplasm of prostate: Secondary | ICD-10-CM | POA: Diagnosis not present

## 2023-06-10 DIAGNOSIS — Z510 Encounter for antineoplastic radiation therapy: Secondary | ICD-10-CM | POA: Diagnosis not present

## 2023-06-10 LAB — RAD ONC ARIA SESSION SUMMARY
Course Elapsed Days: 35
Plan Fractions Treated to Date: 28
Plan Prescribed Dose Per Fraction: 2.5 Gy
Plan Total Fractions Prescribed: 28
Plan Total Prescribed Dose: 70 Gy
Reference Point Dosage Given to Date: 70 Gy
Reference Point Session Dosage Given: 2.5 Gy
Session Number: 28

## 2023-06-11 ENCOUNTER — Ambulatory Visit: Payer: Medicare Other

## 2023-06-11 NOTE — Radiation Completion Notes (Addendum)
Radiation Oncology         [phone] ________________________________  Name: MAXAMILLIAN BURNO MRN: [id number]  Date: 06/10/2023  DOB: [birth year]  Patient Name: Travis Padilla, Travis Padilla MRN: [id number] Date of Birth: [birth year] Referring Physician: Jettie Pagan, M.D. Date of Service: 2023-06-11 Radiation Oncologist: Margaretmary [name], M.D. [facility]     RADIATION ONCOLOGY END OF TREATMENT NOTE   Diagnosis: 77 y.o. gentleman with Stage T1c adenocarcinoma of the prostate with Gleason score of 4+3, and PSA of 5.43.   Intent: Curative   ==========DELIVERED PLANS==========  First Treatment Date: 2023-05-06 - Last Treatment Date: 2023-06-10   Plan Name: Prostate Site: Prostate Technique: IMRT concurrent with ST-ADT (got a 6 month Eligard injection 03/11/23).  Mode: Photon Dose Per Fraction: 2.5 Gy Prescribed Dose (Delivered / Prescribed): 70 Gy / 70 Gy Prescribed Fxs (Delivered / Prescribed): 28 / 28   ==========ON TREATMENT VISIT DATES========== 2023-05-10, 2023-05-17, 2023-05-22, 2023-05-31, 2023-06-07   See weekly On Treatment Notes in Epic for details.  He tolerated the daily radiation treatments relatively well with only mild urinary symptoms with increased nocturia and some modest fatigue.  The patient will receive a call in about one month from the radiation oncology department. He will continue follow up with his urologist, [name], as well.  ------------------------------------------------   Margaretmary Dys, MD [facility]  Radiation Oncology Direct Dial: [phone]  Fax: [phone] [email or web address]  Skype  LinkedIn

## 2023-06-12 ENCOUNTER — Ambulatory Visit: Payer: Medicare Other

## 2023-06-13 ENCOUNTER — Ambulatory Visit: Payer: Medicare Other

## 2023-06-18 ENCOUNTER — Other Ambulatory Visit: Payer: Medicare Other

## 2023-06-18 DIAGNOSIS — E039 Hypothyroidism, unspecified: Secondary | ICD-10-CM

## 2023-06-19 ENCOUNTER — Encounter: Payer: Self-pay | Admitting: Family Medicine

## 2023-06-19 ENCOUNTER — Ambulatory Visit: Payer: Medicare Other | Admitting: Family Medicine

## 2023-06-19 VITALS — BP 131/74 | HR 82 | Temp 98.4°F | Ht 74.0 in | Wt 247.0 lb

## 2023-06-19 DIAGNOSIS — Z23 Encounter for immunization: Secondary | ICD-10-CM | POA: Diagnosis not present

## 2023-06-19 DIAGNOSIS — F4321 Adjustment disorder with depressed mood: Secondary | ICD-10-CM

## 2023-06-19 DIAGNOSIS — E039 Hypothyroidism, unspecified: Secondary | ICD-10-CM | POA: Diagnosis not present

## 2023-06-19 DIAGNOSIS — J329 Chronic sinusitis, unspecified: Secondary | ICD-10-CM | POA: Diagnosis not present

## 2023-06-19 LAB — T4, FREE: Free T4: 1.28 ng/dL (ref 0.82–1.77)

## 2023-06-19 LAB — TSH: TSH: 0.351 u[IU]/mL — ABNORMAL LOW (ref 0.450–4.500)

## 2023-06-19 NOTE — Progress Notes (Signed)
Subjective: CC: Hypothyroidism PCP: [name], DO ZOX:WRUE Travis Padilla is a 77 y.o. male presenting to clinic today for:  1.  Hypothyroidism Patient reports compliance with Synthroid 150 mcg daily.  No reports of tremor, heart palpitations.  He notes that diarrhea has resolved with use of probiotic and changing his diet up some.  He did have some issues with hemorrhoids and straining after being treated for prostate but he will be having a repeat PSA performed in the next month or so hopefully he will see improvement.  He is currently not having any hemorrhoid symptoms or bleeding.  2.  Rhinosinusitis He reports some sinus issues today.  Sometimes he feels like he is wheezing at nighttime.  He does not report any new onset shortness of breath.  He has history of lung cancer and is under the care of hematology/oncology for this.   ROS: Per HPI  Allergies  Allergen Reactions   Iodinated Contrast Media Hives and Rash    Pt presented to CT after completed 13 hr premedication regimen and still had an minor rash/hives.    Iohexol Rash    CT scan contrast caused rash and itching    does fine with premeds.   Pravastatin     Numbness in feet    Past Medical History:  Diagnosis Date   Arthritis    Benign localized prostatic hyperplasia with lower urinary tract symptoms (LUTS)    CAD (coronary artery disease) 2013   cardiologist--- dr [name];   08-26-2012 cath for poss ischemia on NUC-- moderate nonob CAD, aggressive medical manage;  staged cardiac cath 09/ 2022 PCI, DES x1 to mLAD (80%) ;   arthrectomy and DES prox-mid RCA Mild nonobstructive plaque in cath 2013   Chronic constipation    COPD with emphysema [employer])    pulmonology--- dr b. icard/ tammy parrett NP;   no oxygen, no daily inhaler   Diverticulosis of colon    w/ hx diverticulitis   Dyslipidemia    GAD (generalized anxiety disorder)    GERD (gastroesophageal reflux disease)    Hemorrhoids    internal and external    History of adenomatous polyp of colon    followed by dr Leone Payor   History of antineoplastic chemotherapy    History of basal cell carcinoma (BCC) excision    2019  s/p MOH's nose   History of GI bleed 05/2021   in setting post op cardiac cath PCI / stenting on blood thinnner/ 05-27-2021 Hg 7.9,  EGD done 05-30-2023  showed erosive gastrophy no active bleeding,  received x3 PRBC and IV iron infusions   History of gout    History of kidney stones    History of palpitations    event monitor--- 11-21-2021  showed infrequent NSVT, ST   History of radiation therapy    pallitive radition to right lung and left axilla 04-30-2018  to 05-21-2018;   enlarging early stage LUL mass SBRT  01-09-2022  to 01-19-2022   Hypertension    Hypothyroidism (acquired)    followed by pcp   IDA (iron deficiency anemia)    Malignant neoplasm prostate [facility]) 01/2023   urologist--- dr gay/  radiation onologist--- dr Kathrynn Running;  dx 05/ 2024, gleason 4+3   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) [facility]) dx'd 04/17/18   to LN, infrahilar mass, lung nodule and lt axilla   Nephrolithiasis    per CT 02-21-2023 in epic bilateral renal calucli nonobstructive   OSA (obstructive sleep  apnea)    sleep study 03-22-2021 in epic , mild osa AHI 8.2/hr  cpap intolerant   Port-A-Cath in place    Squamous cell carcinoma of lung, stage IV, 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   oncologist--- dr Arbutus Ped  radiation onologist-- dr Mitzi Hansen;  dx by needle bx;  Non-small cell lung cancer SCC w/ large right infrahilar mass in addition left upper lobe nodule & left axilla mass w/ left axilla lymph node;  treated w/ pallitive radiation , systemic chemo, maintenance immunotherapy discontinued due to intolerence 03/ 2022    Current Outpatient Medications:    acetaminophen (TYLENOL) 500 MG tablet, Take 1,000 mg by mouth every 6 (six) hours as needed for moderate pain., Disp: , Rfl:    albuterol (VENTOLIN HFA) 108 (90 Base) MCG/ACT inhaler, Inhale 2  puffs into the lungs every 6 (six) hours as needed for wheezing or shortness of breath., Disp: 8 g, Rfl: 6   ALPRAZolam (XANAX) 0.25 MG tablet, Take 0.5-1 tablets (0.125-0.25 mg total) by mouth at bedtime as needed for anxiety., Disp: 30 tablet, Rfl: 0   Artificial Tear Solution (SOOTHE XP OP), Place 1 drop into both eyes 2 (two) times daily., Disp: , Rfl:    b complex vitamins capsule, Take 1 capsule by mouth daily., Disp: , Rfl:    carvedilol (COREG) 25 MG tablet, Take 1 tablet (25 mg total) by mouth 2 (two) times daily with a meal., Disp: 180 tablet, Rfl: 2   clopidogrel (PLAVIX) 75 MG tablet, TAKE 1 TABLET BY MOUTH EVERY DAY, Disp: 90 tablet, Rfl: 3   [employer] IRON 325 (65 Fe) MG tablet, TAKE 1 TABLET BY MOUTH DAILY (Patient taking differently: Take 325 mg by mouth daily with breakfast.), Disp: 90 tablet, Rfl: 1   docusate sodium (COLACE) 100 MG capsule, Take 100 mg by mouth daily as needed for mild constipation., Disp: , Rfl:    fluticasone (FLONASE) 50 MCG/ACT nasal spray, Place 1 spray into both nostrils daily as needed for allergies., Disp: , Rfl:    levothyroxine (SYNTHROID) 150 MCG tablet, Take 1 tablet (150 mcg total) by mouth daily., Disp: 90 tablet, Rfl: 0   methocarbamol (ROBAXIN) 500 MG tablet, Take 500 mg by mouth every 6 (six) hours as needed for muscle spasms., Disp: , Rfl:    Multiple Vitamin (MULTIVITAMIN) capsule, Take 1 capsule by mouth daily., Disp: , Rfl:    nitroGLYCERIN (NITROSTAT) 0.4 MG SL tablet, Place 1 tablet (0.4 mg total) under the tongue every 5 (five) minutes as needed for chest pain., Disp: 25 tablet, Rfl: 2   pantoprazole (PROTONIX) 40 MG tablet, TAKE 1 TABLET BY MOUTH DAILY BEFORE BREAKFAST (Patient taking differently: Take 40 mg by mouth daily before breakfast.), Disp: 90 tablet, Rfl: 3   Polyethylene Glycol 3350 (MIRALAX PO), Take by mouth daily., Disp: , Rfl:    PRALUENT 150 MG/ML SOAJ, INJECT 150 MG INTO THE SKIN EVERY 14 (FOURTEEN) DAYS., Disp: 6 mL, Rfl: 3    predniSONE (DELTASONE) 50 MG tablet, Take one tablet 13 hours , one  tablet 7 hours and 1 tablet one hour prior to CT scan. Take benadryl 50 mg one hour prior to scan. Must have a driver., Disp: 3 tablet, Rfl: 5   Probiotic Product (ALIGN PO), Take 1 tablet by mouth 2 (two) times daily. gummy, Disp: , Rfl:    tamsulosin (FLOMAX) 0.4 MG CAPS capsule, Take 0.4 mg by mouth at bedtime., Disp: , Rfl:  Social History   Socioeconomic History   Marital status:  Widowed    Spouse name: Not on file   Number of children: 1   Years of education: Not on file   Highest education level: Not on file  Occupational History   Occupation: Emergency planning/management officer  Tobacco Use   Smoking status: Former    Current packs/day: 0.00    Average packs/day: 0.2 packs/day for 2.0 years (0.4 ttl pk-yrs)    Types: Cigarettes    Start date: 10/01/1968    Quit date: 42    Years since quitting: 52.8   Smokeless tobacco: Never  Vaping Use   Vaping status: Never Used  Substance and Sexual Activity   Alcohol use: Not Currently    Comment: occasional   Drug use: Never   Sexual activity: Not on file  Other Topics Concern   Not on file  Social History Narrative   Unable to ask intimate partner violence questions, wife present.    Pt's wife died in 09-02-2021.   Social Determinants of Health   Financial Resource Strain: Low Risk  (11/22/2022)   Overall Financial Resource Strain (CARDIA)    Difficulty of Paying Living Expenses: Not hard at all  Food Insecurity: No Food Insecurity (03/11/2023)   Hunger Vital Sign    Worried About Running Out of Food in the Last Year: Never true    Ran Out of Food in the Last Year: Never true  Transportation Needs: No Transportation Needs (03/11/2023)   PRAPARE - Administrator, Civil Service (Medical): No    Lack of Transportation (Non-Medical): No  Physical Activity: Sufficiently Active (11/22/2022)   Exercise Vital Sign    Days of Exercise per Week: 4 days    Minutes of  Exercise per Session: 60 min  Stress: No Stress Concern Present (11/22/2022)   [employer] of Occupational Health - Occupational Stress Questionnaire    Feeling of Stress : Not at all  Social Connections: Moderately Isolated (11/22/2022)   Social Connection and Isolation Panel [NHANES]    Frequency of Communication with Friends and Family: More than three times a week    Frequency of Social Gatherings with Friends and Family: More than three times a week    Attends Religious Services: Never    Database administrator or Organizations: Yes    Attends Engineer, structural: More than 4 times per year    Marital Status: Widowed  Intimate Partner Violence: Not At Risk (03/11/2023)   Humiliation, Afraid, Rape, and Kick questionnaire    Fear of Current or Ex-Partner: No    Emotionally Abused: No    Physically Abused: No    Sexually Abused: No   Family History  Problem Relation Age of Onset   Heart failure Mother    Parkinson's disease Mother    Heart attack Father    Healthy Sister    Skin cancer Sister    Healthy Sister    Healthy Sister    Neuropathy Brother    COPD Brother    Epilepsy Brother    Colon cancer Neg Hx    Pancreatic cancer Neg Hx    Prostate cancer Neg Hx    Rectal cancer Neg Hx    Stomach cancer Neg Hx     Objective: Office vital signs reviewed. BP 131/74   Pulse 82   Temp 98.4 F (36.9 C)   Ht 6\' 2"  (1.88 m)   Wt 247 lb (112 kg)   SpO2 95%   BMI 31.71 kg/m   Physical  Examination:  General: Awake, alert, well nourished, No acute distress HEENT: Sclera white.  Moist mucous membranes.  No exophthalmos Cardio: regular rate and rhythm, S1S2 heard, no murmurs appreciated Pulm: clear to auscultation bilaterally, no wheezes, rhonchi or rales; normal work of breathing on room air       06/19/2023   11:11 AM 03/19/2023    3:28 PM 11/22/2022    2:22 PM  Depression screen PHQ 2/9  Decreased Interest 0 0 0  Down, Depressed, Hopeless 0 0 0  PHQ - 2  Score 0 0 0  Altered sleeping 0 0   Tired, decreased energy 0 0   Change in appetite 0 0   Feeling bad or failure about yourself  0 0   Trouble concentrating 0 0   Moving slowly or fidgety/restless 0 0   Suicidal thoughts 0 0   PHQ-9 Score 0 0   Difficult doing work/chores Not difficult at all Not difficult at all       06/19/2023   11:11 AM 03/19/2023    3:29 PM 09/11/2022    2:15 PM 04/18/2022   11:06 AM  GAD 7 : Generalized Anxiety Score  Nervous, Anxious, on Edge 0 0 0 1  Control/stop worrying 0 0 0 1  Worry too much - different things 0 0 0 1  Trouble relaxing 0 0 0   Restless 0 0 0 0  Easily annoyed or irritable 0 0 0 0  Afraid - awful might happen 0 0 0 0  Total GAD 7 Score 0 0 0   Anxiety Difficulty Not difficult at all  Not difficult at all Not difficult at all      Assessment/ Plan: 77 y.o. male   Hypothyroidism (acquired) - Plan: TSH, T4, free  Encounter for immunization - Plan: Flu Vaccine Trivalent High Dose (Fluad)  Grief reaction  Rhinosinusitis  TSH slightly suppressed.  I have recommended reduction in Synthroid dose to 1/2 tablet on Saturdays with 1 tablet daily all others.  We will plan to repeat TSH and free T4 at next visit.  I have tentatively scheduled him in December, specifically because of the passing of his wife this past year and his struggles with cancer.  I think that these holidays may be a little bit harder on him than typical.  Influenza vaccination administered  Advised to use Claritin daily for allergy symptoms.  Has Flonase if needed.  May increase to 2 sprays in each nostril daily if needed   [name], DO [facility] [phone]

## 2023-06-20 ENCOUNTER — Inpatient Hospital Stay: Payer: Medicare Other | Attending: Internal Medicine

## 2023-06-20 DIAGNOSIS — Z95828 Presence of other vascular implants and grafts: Secondary | ICD-10-CM

## 2023-06-20 DIAGNOSIS — C61 Malignant neoplasm of prostate: Secondary | ICD-10-CM | POA: Diagnosis not present

## 2023-06-20 DIAGNOSIS — C7801 Secondary malignant neoplasm of right lung: Secondary | ICD-10-CM

## 2023-06-20 MED ORDER — HEPARIN SOD (PORK) LOCK FLUSH 100 UNIT/ML IV SOLN
500.0000 [IU] | Freq: Once | INTRAVENOUS | Status: AC
  Administered 2023-06-20: 500 [IU]

## 2023-06-20 MED ORDER — SODIUM CHLORIDE 0.9% FLUSH
10.0000 mL | Freq: Once | INTRAVENOUS | Status: AC
  Administered 2023-06-20: 10 mL

## 2023-06-21 ENCOUNTER — Other Ambulatory Visit: Payer: Self-pay | Admitting: Urology

## 2023-06-21 DIAGNOSIS — C61 Malignant neoplasm of prostate: Secondary | ICD-10-CM

## 2023-06-21 NOTE — Progress Notes (Signed)
Patient was a RadOnc Consult on 02/22/23 for his stage T1c adenocarcinoma of the prostate with Gleason score of 4+3, and PSA of 5.43.  Patient proceed with treatment recommendations of  5.5 weeks of external beam therapy without ST-ADT.  and had his final radiation treatment on 06/10/23.   Patient is scheduled for a post treatment nurse call on 07/09/23 and has a follow up appointment at [facility] on 08/01/23.

## 2023-06-25 ENCOUNTER — Telehealth: Payer: Self-pay | Admitting: Internal Medicine

## 2023-07-09 ENCOUNTER — Ambulatory Visit
Admission: RE | Admit: 2023-07-09 | Discharge: 2023-07-09 | Disposition: A | Discharge status: Home / Self Care | Payer: Medicare Other | Source: Ambulatory Visit | Attending: Internal Medicine | Admitting: Internal Medicine

## 2023-07-09 ENCOUNTER — Encounter: Payer: Self-pay | Admitting: *Deleted

## 2023-07-09 DIAGNOSIS — Z510 Encounter for antineoplastic radiation therapy: Secondary | ICD-10-CM | POA: Insufficient documentation

## 2023-07-09 DIAGNOSIS — C61 Malignant neoplasm of prostate: Secondary | ICD-10-CM | POA: Insufficient documentation

## 2023-07-09 NOTE — Progress Notes (Signed)
Radiation Oncology         [phone] ________________________________  Name: Travis Padilla MRN: [id number]  Date of Service: 07/09/2023  DOB: 30-Jul-1946  Post Treatment Telephone Note  Diagnosis:  Stage T1c adenocarcinoma of the prostate with Gleason score of 4+3, and PSA of 5.43. (as documented in provider EOT note)  Pre Treatment IPSS Score: 12 (as documented in the provider consult note)  The patient was available for call today.   Symptoms of fatigue have improved since completing therapy.  Symptoms of bladder changes have improved since completing therapy. Current symptoms include urinary urgency, and medications for bladder symptoms include Tamsulosin.  Symptoms of bowel changes have improved since completing therapy. Current symptoms include none, and medications for bowel symptoms include none.   Post Treatment IPSS Score: IPSS Questionnaire (AUA-7): Over the past month.   1)  How often have you had a sensation of not emptying your bladder completely after you finish urinating?  0 - Not at all  2)  How often have you had to urinate again less than two hours after you finished urinating? 3 - About half the time  3)  How often have you found you stopped and started again several times when you urinated?  0 - Not at all  4) How difficult have you found it to postpone urination?  2 - Less than half the time  5) How often have you had a weak urinary stream?  2 - Less than half the time  6) How often have you had to push or strain to begin urination?  0 - Not at all  7) How many times did you most typically get up to urinate from the time you went to bed until the time you got up in the morning?  2 - 2 times  Total score:  9. Which indicates moderate symptoms  0-7 mildly symptomatic   8-19 moderately symptomatic   20-35 severely symptomatic    Patient has a scheduled follow up visit with his urologist, [name], on 07/2023 for ongoing surveillance. He was counseled that  PSA levels will be drawn in the urology office, and was reassured that additional time is expected to improve bowel and bladder symptoms. He was encouraged to call back with concerns or questions regarding radiation.  This concludes the interaction.  Ruel Favors, LPN

## 2023-07-12 ENCOUNTER — Encounter: Payer: Self-pay | Admitting: *Deleted

## 2023-07-24 DIAGNOSIS — Z23 Encounter for immunization: Secondary | ICD-10-CM | POA: Diagnosis not present

## 2023-07-28 ENCOUNTER — Other Ambulatory Visit: Payer: Self-pay | Admitting: Family Medicine

## 2023-07-28 DIAGNOSIS — E039 Hypothyroidism, unspecified: Secondary | ICD-10-CM

## 2023-07-29 ENCOUNTER — Other Ambulatory Visit (HOSPITAL_COMMUNITY): Payer: Self-pay

## 2023-08-01 DIAGNOSIS — C61 Malignant neoplasm of prostate: Secondary | ICD-10-CM | POA: Diagnosis not present

## 2023-08-01 DIAGNOSIS — M25562 Pain in left knee: Secondary | ICD-10-CM | POA: Diagnosis not present

## 2023-08-01 DIAGNOSIS — N200 Calculus of kidney: Secondary | ICD-10-CM | POA: Diagnosis not present

## 2023-08-02 ENCOUNTER — Encounter: Payer: Self-pay | Admitting: *Deleted

## 2023-08-02 ENCOUNTER — Inpatient Hospital Stay: Payer: Medicare Other | Attending: Adult Health | Admitting: *Deleted

## 2023-08-02 DIAGNOSIS — C61 Malignant neoplasm of prostate: Secondary | ICD-10-CM

## 2023-08-02 NOTE — Progress Notes (Signed)
  SCP reviewed and completed. Pt had PSA labs drawn yesterday at appt at [facility]. Last colonoscopy 02/13/2023. Pt is unable to exercise currently because of left hamstring pain he is experiencing. Pt says he has visited doctor and is currently on a steroid pack. Pt has received COVID booster and flu vaccine this year.

## 2023-08-19 ENCOUNTER — Emergency Department (HOSPITAL_COMMUNITY)
Admission: EM | Admit: 2023-08-19 | Discharge: 2023-08-19 | Disposition: A | Discharge status: Home / Self Care | Payer: No Typology Code available for payment source | Attending: Emergency Medicine | Admitting: Emergency Medicine

## 2023-08-19 ENCOUNTER — Other Ambulatory Visit: Payer: Self-pay

## 2023-08-19 ENCOUNTER — Emergency Department (HOSPITAL_COMMUNITY): Payer: No Typology Code available for payment source

## 2023-08-19 ENCOUNTER — Encounter (HOSPITAL_COMMUNITY): Payer: Self-pay | Admitting: *Deleted

## 2023-08-19 DIAGNOSIS — R0789 Other chest pain: Secondary | ICD-10-CM | POA: Diagnosis not present

## 2023-08-19 DIAGNOSIS — Y92410 Unspecified street and highway as the place of occurrence of the external cause: Secondary | ICD-10-CM | POA: Diagnosis not present

## 2023-08-19 DIAGNOSIS — Z7902 Long term (current) use of antithrombotics/antiplatelets: Secondary | ICD-10-CM | POA: Insufficient documentation

## 2023-08-19 DIAGNOSIS — Z8546 Personal history of malignant neoplasm of prostate: Secondary | ICD-10-CM | POA: Insufficient documentation

## 2023-08-19 DIAGNOSIS — Z79899 Other long term (current) drug therapy: Secondary | ICD-10-CM | POA: Diagnosis not present

## 2023-08-19 DIAGNOSIS — I959 Hypotension, unspecified: Secondary | ICD-10-CM | POA: Diagnosis not present

## 2023-08-19 DIAGNOSIS — M47812 Spondylosis without myelopathy or radiculopathy, cervical region: Secondary | ICD-10-CM | POA: Diagnosis not present

## 2023-08-19 DIAGNOSIS — I10 Essential (primary) hypertension: Secondary | ICD-10-CM | POA: Diagnosis not present

## 2023-08-19 DIAGNOSIS — Z79890 Hormone replacement therapy (postmenopausal): Secondary | ICD-10-CM | POA: Diagnosis not present

## 2023-08-19 DIAGNOSIS — Z85118 Personal history of other malignant neoplasm of bronchus and lung: Secondary | ICD-10-CM | POA: Insufficient documentation

## 2023-08-19 DIAGNOSIS — T148XXA Other injury of unspecified body region, initial encounter: Secondary | ICD-10-CM | POA: Diagnosis not present

## 2023-08-19 DIAGNOSIS — R079 Chest pain, unspecified: Secondary | ICD-10-CM | POA: Diagnosis not present

## 2023-08-19 DIAGNOSIS — I700 Atherosclerosis of aorta: Secondary | ICD-10-CM | POA: Diagnosis not present

## 2023-08-19 DIAGNOSIS — S199XXA Unspecified injury of neck, initial encounter: Secondary | ICD-10-CM | POA: Diagnosis not present

## 2023-08-19 DIAGNOSIS — Z87891 Personal history of nicotine dependence: Secondary | ICD-10-CM | POA: Insufficient documentation

## 2023-08-19 DIAGNOSIS — E039 Hypothyroidism, unspecified: Secondary | ICD-10-CM | POA: Insufficient documentation

## 2023-08-19 DIAGNOSIS — S0990XA Unspecified injury of head, initial encounter: Secondary | ICD-10-CM | POA: Diagnosis not present

## 2023-08-19 DIAGNOSIS — J449 Chronic obstructive pulmonary disease, unspecified: Secondary | ICD-10-CM | POA: Insufficient documentation

## 2023-08-19 DIAGNOSIS — M4802 Spinal stenosis, cervical region: Secondary | ICD-10-CM | POA: Diagnosis not present

## 2023-08-19 DIAGNOSIS — I2510 Atherosclerotic heart disease of native coronary artery without angina pectoris: Secondary | ICD-10-CM | POA: Diagnosis not present

## 2023-08-19 LAB — CBC WITH DIFFERENTIAL/PLATELET
Abs Immature Granulocytes: 0.06 10*3/uL (ref 0.00–0.07)
Basophils Absolute: 0 10*3/uL (ref 0.0–0.1)
Basophils Relative: 0 %
Eosinophils Absolute: 0.1 10*3/uL (ref 0.0–0.5)
Eosinophils Relative: 2 %
HCT: 35.7 % — ABNORMAL LOW (ref 39.0–52.0)
Hemoglobin: 12.4 g/dL — ABNORMAL LOW (ref 13.0–17.0)
Immature Granulocytes: 1 %
Lymphocytes Relative: 12 %
Lymphs Abs: 0.5 10*3/uL — ABNORMAL LOW (ref 0.7–4.0)
MCH: 34.7 pg — ABNORMAL HIGH (ref 26.0–34.0)
MCHC: 34.7 g/dL (ref 30.0–36.0)
MCV: 100 fL (ref 80.0–100.0)
Monocytes Absolute: 0.8 10*3/uL (ref 0.1–1.0)
Monocytes Relative: 18 %
Neutro Abs: 3 10*3/uL (ref 1.7–7.7)
Neutrophils Relative %: 67 %
Platelets: 129 10*3/uL — ABNORMAL LOW (ref 150–400)
RBC: 3.57 MIL/uL — ABNORMAL LOW (ref 4.22–5.81)
RDW: 11.7 % (ref 11.5–15.5)
WBC: 4.5 10*3/uL (ref 4.0–10.5)
nRBC: 0 % (ref 0.0–0.2)

## 2023-08-19 LAB — COMPREHENSIVE METABOLIC PANEL
ALT: 18 U/L (ref 0–44)
AST: 21 U/L (ref 15–41)
Albumin: 3.9 g/dL (ref 3.5–5.0)
Alkaline Phosphatase: 69 U/L (ref 38–126)
Anion gap: 8 (ref 5–15)
BUN: 20 mg/dL (ref 8–23)
CO2: 25 mmol/L (ref 22–32)
Calcium: 9.3 mg/dL (ref 8.9–10.3)
Chloride: 105 mmol/L (ref 98–111)
Creatinine, Ser: 1 mg/dL (ref 0.61–1.24)
GFR, Estimated: >60 mL/min (ref >60)
Glucose, Bld: 121 mg/dL — ABNORMAL HIGH (ref 70–99)
Potassium: 4.8 mmol/L (ref 3.5–5.1)
Sodium: 138 mmol/L (ref 135–145)
Total Bilirubin: 0.6 mg/dL (ref <1.2)
Total Protein: 7.3 g/dL (ref 6.5–8.1)

## 2023-08-19 MED ORDER — OXYCODONE HCL 5 MG PO TABS: 2.5000 mg | ORAL_TABLET | Freq: Four times a day (QID) | ORAL | 0 refills | Status: AC | PRN

## 2023-08-19 NOTE — ED Notes (Signed)
Pt requested something to drink Informed pt that the results from XRAY and CT (which the pt has not gone to yet) need to come back prior to having anything to eat or drink.  Pt waiting on provider eval and imaging  Pt has allergy to contrast dye

## 2023-08-19 NOTE — ED Notes (Signed)
CT ready for pt PA stated to add head CT Orders placed Pt off to CT
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
## 2023-08-19 NOTE — ED Notes (Signed)
Pt ambulated to and from restroom Water given Pt waiting to speak to PA
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
## 2023-08-19 NOTE — ED Notes (Signed)
PA at bedside.
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
## 2023-08-19 NOTE — ED Notes (Signed)
Pt used IS  Instructed on home use Pt got belongings back from security

## 2023-08-19 NOTE — ED Notes (Signed)
Removed from monitor and getting clothes on  Pain 8/10 Pt waiting to speak to PA

## 2023-08-19 NOTE — ED Notes (Signed)
Pt remains attached to the full monitor Son at bedside  All questions addressed

## 2023-08-20 ENCOUNTER — Telehealth: Payer: Self-pay | Admitting: Medical Oncology
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
## 2023-08-21 ENCOUNTER — Other Ambulatory Visit: Payer: Self-pay
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
## 2023-08-22 ENCOUNTER — Inpatient Hospital Stay: Payer: Medicare Other

## 2023-08-22 ENCOUNTER — Telehealth: Payer: Self-pay | Admitting: Medical Oncology

## 2023-08-22 ENCOUNTER — Ambulatory Visit (HOSPITAL_COMMUNITY): Payer: Medicare Other

## 2023-08-22 NOTE — Telephone Encounter (Addendum)
Pt notified that Arbutus Ped wants him to get CT abd/pelv first before consideration of PET.

## 2023-08-27 ENCOUNTER — Ambulatory Visit (INDEPENDENT_AMBULATORY_CARE_PROVIDER_SITE_OTHER): Payer: Medicare Other | Admitting: Family Medicine

## 2023-08-27 ENCOUNTER — Encounter: Payer: Self-pay | Admitting: Family Medicine

## 2023-08-27 VITALS — BP 138/88 | HR 85 | Temp 98.8°F | Ht 74.0 in | Wt 256.0 lb

## 2023-08-27 DIAGNOSIS — D649 Anemia, unspecified: Secondary | ICD-10-CM | POA: Diagnosis not present

## 2023-08-27 DIAGNOSIS — F4321 Adjustment disorder with depressed mood: Secondary | ICD-10-CM | POA: Diagnosis not present

## 2023-08-27 DIAGNOSIS — S134XXS Sprain of ligaments of cervical spine, sequela: Secondary | ICD-10-CM | POA: Diagnosis not present

## 2023-08-27 NOTE — Addendum Note (Signed)
Addended by: Waynette Buttery on: 08/27/2023 03:48 PM   Modules accepted: Orders
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
## 2023-08-28 ENCOUNTER — Ambulatory Visit (HOSPITAL_COMMUNITY): Payer: Medicare Other

## 2023-08-28 ENCOUNTER — Ambulatory Visit (HOSPITAL_COMMUNITY)
Admission: RE | Admit: 2023-08-28 | Discharge: 2023-08-28 | Disposition: A | Discharge status: Home / Self Care | Payer: Medicare Other | Source: Ambulatory Visit | Attending: Internal Medicine | Admitting: Internal Medicine

## 2023-08-28 ENCOUNTER — Other Ambulatory Visit: Payer: Medicare Other

## 2023-08-28 DIAGNOSIS — K5730 Diverticulosis of large intestine without perforation or abscess without bleeding: Secondary | ICD-10-CM | POA: Diagnosis not present

## 2023-08-28 DIAGNOSIS — C3490 Malignant neoplasm of unspecified part of unspecified bronchus or lung: Secondary | ICD-10-CM | POA: Insufficient documentation

## 2023-08-28 LAB — ANEMIA PROFILE B
Basophils Absolute: 0 10*3/uL (ref 0.0–0.2)
Basos: 1 %
EOS (ABSOLUTE): 0.2 10*3/uL (ref 0.0–0.4)
Eos: 4 %
Ferritin: 384 ng/mL (ref 30–400)
Hematocrit: 36.7 % — ABNORMAL LOW (ref 37.5–51.0)
Hemoglobin: 12.3 g/dL — ABNORMAL LOW (ref 13.0–17.7)
Immature Grans (Abs): 0 10*3/uL (ref 0.0–0.1)
Immature Granulocytes: 1 %
Iron Saturation: 33 % (ref 15–55)
Iron: 109 ug/dL (ref 38–169)
Lymphocytes Absolute: 0.8 10*3/uL (ref 0.7–3.1)
Lymphs: 21 %
MCH: 34.5 pg — ABNORMAL HIGH (ref 26.6–33.0)
MCHC: 33.5 g/dL (ref 31.5–35.7)
MCV: 103 fL — ABNORMAL HIGH (ref 79–97)
Monocytes Absolute: 0.7 10*3/uL (ref 0.1–0.9)
Monocytes: 18 %
Neutrophils Absolute: 2.2 10*3/uL (ref 1.4–7.0)
Neutrophils: 55 %
Platelets: 173 10*3/uL (ref 150–450)
RBC: 3.57 x10E6/uL — ABNORMAL LOW (ref 4.14–5.80)
RDW: 12.1 % (ref 11.6–15.4)
Retic Ct Pct: 1.6 % (ref 0.6–2.6)
Total Iron Binding Capacity: 328 ug/dL (ref 250–450)
UIBC: 219 ug/dL (ref 111–343)
Vitamin B-12: 350 pg/mL (ref 232–1245)
WBC: 4 10*3/uL (ref 3.4–10.8)

## 2023-08-28 LAB — FECAL OCCULT BLOOD, IMMUNOCHEMICAL: Fecal Occult Bld: NEGATIVE

## 2023-08-29 ENCOUNTER — Ambulatory Visit: Payer: Medicare Other | Admitting: Internal Medicine

## 2023-08-30 ENCOUNTER — Encounter: Payer: Self-pay | Admitting: Internal Medicine

## 2023-09-02 ENCOUNTER — Other Ambulatory Visit: Payer: Self-pay | Admitting: *Deleted

## 2023-09-02 DIAGNOSIS — C61 Malignant neoplasm of prostate: Secondary | ICD-10-CM

## 2023-09-03 ENCOUNTER — Inpatient Hospital Stay: Payer: Medicare Other | Attending: Adult Health | Admitting: Internal Medicine

## 2023-09-03 ENCOUNTER — Inpatient Hospital Stay: Payer: Medicare Other

## 2023-09-03 VITALS — BP 127/80

## 2023-09-03 VITALS — BP 151/81 | HR 88 | Temp 98.4°F | Wt 253.4 lb

## 2023-09-03 DIAGNOSIS — I2510 Atherosclerotic heart disease of native coronary artery without angina pectoris: Secondary | ICD-10-CM | POA: Insufficient documentation

## 2023-09-03 DIAGNOSIS — C3490 Malignant neoplasm of unspecified part of unspecified bronchus or lung: Secondary | ICD-10-CM | POA: Diagnosis not present

## 2023-09-03 DIAGNOSIS — Z95828 Presence of other vascular implants and grafts: Secondary | ICD-10-CM

## 2023-09-03 DIAGNOSIS — C61 Malignant neoplasm of prostate: Secondary | ICD-10-CM

## 2023-09-03 DIAGNOSIS — C7801 Secondary malignant neoplasm of right lung: Secondary | ICD-10-CM

## 2023-09-03 DIAGNOSIS — R079 Chest pain, unspecified: Secondary | ICD-10-CM | POA: Insufficient documentation

## 2023-09-03 DIAGNOSIS — E039 Hypothyroidism, unspecified: Secondary | ICD-10-CM | POA: Insufficient documentation

## 2023-09-03 DIAGNOSIS — Z87891 Personal history of nicotine dependence: Secondary | ICD-10-CM | POA: Insufficient documentation

## 2023-09-03 DIAGNOSIS — C3492 Malignant neoplasm of unspecified part of left bronchus or lung: Secondary | ICD-10-CM | POA: Diagnosis not present

## 2023-09-03 DIAGNOSIS — Z923 Personal history of irradiation: Secondary | ICD-10-CM | POA: Insufficient documentation

## 2023-09-03 LAB — CMP (CANCER CENTER ONLY)
ALT: 10 U/L (ref 0–44)
AST: 14 U/L — ABNORMAL LOW (ref 15–41)
Albumin: 4 g/dL (ref 3.5–5.0)
Alkaline Phosphatase: 81 U/L (ref 38–126)
Anion gap: 7 (ref 5–15)
BUN: 17 mg/dL (ref 8–23)
CO2: 26 mmol/L (ref 22–32)
Calcium: 9.6 mg/dL (ref 8.9–10.3)
Chloride: 109 mmol/L (ref 98–111)
Creatinine: 1.01 mg/dL (ref 0.61–1.24)
GFR, Estimated: >60 mL/min (ref >60)
Glucose, Bld: 96 mg/dL (ref 70–99)
Potassium: 4.5 mmol/L (ref 3.5–5.1)
Sodium: 142 mmol/L (ref 135–145)
Total Bilirubin: 0.5 mg/dL (ref <1.2)
Total Protein: 7.1 g/dL (ref 6.5–8.1)

## 2023-09-03 LAB — CBC WITH DIFFERENTIAL (CANCER CENTER ONLY)
Abs Immature Granulocytes: 0.06 10*3/uL (ref 0.00–0.07)
Basophils Absolute: 0 10*3/uL (ref 0.0–0.1)
Basophils Relative: 1 %
Eosinophils Absolute: 0.2 10*3/uL (ref 0.0–0.5)
Eosinophils Relative: 3 %
HCT: 32.9 % — ABNORMAL LOW (ref 39.0–52.0)
Hemoglobin: 12.2 g/dL — ABNORMAL LOW (ref 13.0–17.0)
Immature Granulocytes: 1 %
Lymphocytes Relative: 13 %
Lymphs Abs: 0.7 10*3/uL (ref 0.7–4.0)
MCH: 35.7 pg — ABNORMAL HIGH (ref 26.0–34.0)
MCHC: 37.1 g/dL — ABNORMAL HIGH (ref 30.0–36.0)
MCV: 96.2 fL (ref 80.0–100.0)
Monocytes Absolute: 0.8 10*3/uL (ref 0.1–1.0)
Monocytes Relative: 15 %
Neutro Abs: 3.5 10*3/uL (ref 1.7–7.7)
Neutrophils Relative %: 67 %
Platelet Count: 176 10*3/uL (ref 150–400)
RBC: 3.42 MIL/uL — ABNORMAL LOW (ref 4.22–5.81)
RDW: 11.8 % (ref 11.5–15.5)
WBC Count: 5.3 10*3/uL (ref 4.0–10.5)
nRBC: 0 % (ref 0.0–0.2)

## 2023-09-03 MED ORDER — SODIUM CHLORIDE 0.9% FLUSH
10.0000 mL | Freq: Once | INTRAVENOUS | Status: AC
  Administered 2023-09-03: 10 mL

## 2023-09-03 MED ORDER — HEPARIN SOD (PORK) LOCK FLUSH 100 UNIT/ML IV SOLN
500.0000 [IU] | Freq: Once | INTRAVENOUS | Status: AC
  Administered 2023-09-03: 500 [IU]
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
## 2023-09-19 ENCOUNTER — Ambulatory Visit: Payer: Medicare Other | Attending: Cardiology | Admitting: Cardiology

## 2023-09-19 ENCOUNTER — Encounter: Payer: Self-pay | Admitting: Cardiology

## 2023-09-19 VITALS — BP 122/78 | HR 84 | Ht 74.0 in | Wt 252.0 lb

## 2023-09-19 DIAGNOSIS — I10 Essential (primary) hypertension: Secondary | ICD-10-CM | POA: Diagnosis not present

## 2023-09-19 DIAGNOSIS — J449 Chronic obstructive pulmonary disease, unspecified: Secondary | ICD-10-CM | POA: Diagnosis not present

## 2023-09-19 DIAGNOSIS — E785 Hyperlipidemia, unspecified: Secondary | ICD-10-CM | POA: Diagnosis not present

## 2023-09-19 DIAGNOSIS — R002 Palpitations: Secondary | ICD-10-CM | POA: Insufficient documentation

## 2023-09-19 DIAGNOSIS — I2510 Atherosclerotic heart disease of native coronary artery without angina pectoris: Secondary | ICD-10-CM | POA: Diagnosis not present

## 2023-09-19 NOTE — Patient Instructions (Signed)
 Medication Instructions:  No changes.  *If you need a refill on your cardiac medications before your next appointment, please call your pharmacy*   Lab Work: Lipid profile and Lpa today. If you have labs (blood work) drawn today and your tests are completely normal, you will receive your results only by: MyChart Message (if you have MyChart) OR A paper copy in the mail If you have any lab test that is abnormal or we need to change your treatment, we will call you to review the results.   Follow-Up: At [facility], you and your health needs are our priority.  As part of our continuing mission to provide you with exceptional heart care, we have created designated Provider Care Teams.  These Care Teams include your primary Cardiologist (physician) and Advanced Practice Providers (APPs -  Physician Assistants and Nurse Practitioners) who all work together to provide you with the care you need, when you need it.  We recommend signing up for the patient portal called MyChart.  Sign up information is provided on this After Visit Summary.  MyChart is used to connect with patients for Virtual Visits (Telemedicine).  Patients are able to view lab/test results, encounter notes, upcoming appointments, etc.  Non-urgent messages can be sent to your provider as well.   To learn more about what you can do with MyChart, go to [email or web address].    Your next appointment:   1 year(s)  Provider:   Lynwood Schilling, MD

## 2023-09-20 LAB — LIPID PANEL
Chol/HDL Ratio: 2.5 {ratio} (ref 0.0–5.0)
Cholesterol, Total: 124 mg/dL (ref 100–199)
HDL: 49 mg/dL (ref >39)
LDL Chol Calc (NIH): 45 mg/dL (ref 0–99)
Triglycerides: 184 mg/dL — ABNORMAL HIGH (ref 0–149)
VLDL Cholesterol Cal: 30 mg/dL (ref 5–40)

## 2023-09-20 LAB — LIPOPROTEIN A (LPA): Lipoprotein (a): <8.4 nmol/L (ref <75.0)

## 2023-09-23 DIAGNOSIS — S29011A Strain of muscle and tendon of front wall of thorax, initial encounter: Secondary | ICD-10-CM | POA: Diagnosis not present

## 2023-09-23 DIAGNOSIS — M542 Cervicalgia: Secondary | ICD-10-CM | POA: Diagnosis not present

## 2023-10-14 ENCOUNTER — Inpatient Hospital Stay: Payer: Medicare Other | Attending: Adult Health

## 2023-10-14 DIAGNOSIS — C3412 Malignant neoplasm of upper lobe, left bronchus or lung: Secondary | ICD-10-CM | POA: Diagnosis not present

## 2023-10-14 DIAGNOSIS — Z87891 Personal history of nicotine dependence: Secondary | ICD-10-CM | POA: Diagnosis not present

## 2023-10-14 DIAGNOSIS — C7801 Secondary malignant neoplasm of right lung: Secondary | ICD-10-CM

## 2023-10-14 DIAGNOSIS — Z95828 Presence of other vascular implants and grafts: Secondary | ICD-10-CM

## 2023-10-14 MED ORDER — SODIUM CHLORIDE 0.9% FLUSH
10.0000 mL | Freq: Once | INTRAVENOUS | Status: AC
  Administered 2023-10-14: 10 mL

## 2023-10-14 MED ORDER — HEPARIN SOD (PORK) LOCK FLUSH 100 UNIT/ML IV SOLN
500.0000 [IU] | Freq: Once | INTRAVENOUS | Status: AC
  Administered 2023-10-14: 500 [IU]

## 2023-10-15 ENCOUNTER — Other Ambulatory Visit: Payer: Self-pay | Admitting: Cardiology

## 2023-10-16 ENCOUNTER — Other Ambulatory Visit: Payer: Self-pay | Admitting: Cardiology

## 2023-10-22 ENCOUNTER — Ambulatory Visit: Payer: Medicare Other | Admitting: Adult Health

## 2023-10-22 ENCOUNTER — Other Ambulatory Visit: Payer: Self-pay | Admitting: Adult Health

## 2023-10-22 ENCOUNTER — Encounter: Payer: Self-pay | Admitting: Adult Health

## 2023-10-22 VITALS — BP 138/85 | HR 83 | Temp 96.9°F | Ht 74.0 in | Wt 259.4 lb

## 2023-10-22 DIAGNOSIS — C3491 Malignant neoplasm of unspecified part of right bronchus or lung: Secondary | ICD-10-CM | POA: Diagnosis not present

## 2023-10-22 DIAGNOSIS — J449 Chronic obstructive pulmonary disease, unspecified: Secondary | ICD-10-CM

## 2023-10-22 DIAGNOSIS — R0602 Shortness of breath: Secondary | ICD-10-CM

## 2023-10-22 MED ORDER — INCRUSE ELLIPTA 62.5 MCG/ACT IN AEPB: 1.0000 | INHALATION_SPRAY | Freq: Every day | RESPIRATORY_TRACT | 5 refills | Status: DC

## 2023-10-22 MED ORDER — STIOLTO RESPIMAT 2.5-2.5 MCG/ACT IN AERS: 2.0000 | INHALATION_SPRAY | Freq: Every day | RESPIRATORY_TRACT | 5 refills | Status: DC

## 2023-10-22 NOTE — Assessment & Plan Note (Signed)
Stage IV lung cancer status post chemo, radiation and immunotherapy.  Keytruda stopped due to possible encephalitis.  Recent CT chest with increased solid component in the left upper lobe and soft tissue component in the right lower lobe.  Has been seen by oncology with plans for serial CT chest next month advised to keep follow-up.

## 2023-10-22 NOTE — Patient Instructions (Addendum)
Try Stiolto 2 puffs daily.  Begin Flutter valve Twice daily,  Mucinex Twice daily  As needed  cough/congestion.  Albuterol inhaler As needed   Activity as tolerated.  CT chest next month as planned and follow up with Oncology  Follow up in 3 months with Dr. Delton Coombes - 30 min slot and As needed  -with Spirometry with DLCO  Please contact office for sooner follow up if symptoms do not improve or worsen or seek emergency care
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
## 2023-10-22 NOTE — Addendum Note (Signed)
Addended by: Julio Sicks on: 10/22/2023 01:53 PM   Modules accepted: Orders
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
## 2023-10-23 ENCOUNTER — Other Ambulatory Visit: Payer: Self-pay | Admitting: Family Medicine

## 2023-10-23 DIAGNOSIS — E039 Hypothyroidism, unspecified: Secondary | ICD-10-CM

## 2023-10-24 DIAGNOSIS — R0602 Shortness of breath: Secondary | ICD-10-CM | POA: Diagnosis not present

## 2023-10-24 LAB — BASIC METABOLIC PANEL
BUN: 15 mg/dL (ref 6–23)
CO2: 28 meq/L (ref 19–32)
Calcium: 9.1 mg/dL (ref 8.4–10.5)
Chloride: 106 meq/L (ref 96–112)
Creatinine, Ser: 1.15 mg/dL (ref 0.40–1.50)
GFR: 61.91 mL/min (ref >60.00)
Glucose, Bld: 128 mg/dL — ABNORMAL HIGH (ref 70–99)
Potassium: 4.6 meq/L (ref 3.5–5.1)
Sodium: 142 meq/L (ref 135–145)

## 2023-10-24 LAB — D-DIMER, QUANTITATIVE: D-Dimer, Quant: 0.58 ug{FEU}/mL — ABNORMAL HIGH (ref <0.50)

## 2023-10-25 ENCOUNTER — Telehealth: Payer: Self-pay | Admitting: Adult Health

## 2023-10-25 NOTE — Telephone Encounter (Signed)
Travis Padilla- please advise on lab results. Pt is concerned about elevated D Dimer he viewed on mychart.

## 2023-10-25 NOTE — Telephone Encounter (Signed)
Patient advised as below. Nothing further needed.

## 2023-10-25 NOTE — Telephone Encounter (Signed)
Age adjused D Dimer would be elevated If >.76 So his would be considered okay at 0.58. It is also lower than 7 months ago when he had a VQ scan that was negative for PE.  So with this feel low probability for PE.  If he continues to have symptoms would recommend he seek emergency room care  Please contact office for sooner follow up if symptoms do not improve or worsen or seek emergency care     the [facility] recommends an age-adjusted cut-off value  in patients older than 50. The calculation for an age  adjusted cut-off value is age (years) x 0.01 mcg/mL  FEU. For example, the cut-off for a 77 year old patient  would be 70 x 0.01 mcg/mL FEU.

## 2023-10-31 NOTE — Progress Notes (Signed)
Called and spoke with patient, advised regarding result of D-dimer and to seek emergency care if he continues to have symptoms.  He stated he is using the inhaler he was given and is some better, but it is still rough.  Advised to call for sooner follow up if needed.  He verbalized understanding.  Nothing further needed.

## 2023-11-14 ENCOUNTER — Other Ambulatory Visit: Payer: Self-pay | Admitting: Family Medicine

## 2023-11-15 ENCOUNTER — Telehealth: Payer: Self-pay | Admitting: Family Medicine

## 2023-11-15 NOTE — Telephone Encounter (Signed)
 Copied from CRM [id number]. Topic: Clinical - Medical Advice >> Nov 15, 2023  3:23 PM Carlatta H wrote: Reason for CRM: Patient has had some changes in medication and would like a call back from the nurse or doctor

## 2023-11-18 ENCOUNTER — Other Ambulatory Visit: Payer: Self-pay | Admitting: *Deleted

## 2023-11-18 ENCOUNTER — Telehealth: Payer: Self-pay | Admitting: Adult Health

## 2023-11-18 MED ORDER — ALBUTEROL SULFATE (2.5 MG/3ML) 0.083% IN NEBU: 2.5000 mg | INHALATION_SOLUTION | Freq: Four times a day (QID) | RESPIRATORY_TRACT | 5 refills | Status: DC | PRN
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
## 2023-11-18 NOTE — Telephone Encounter (Signed)
 Patient states needs refill for Albuterol solution. Pharmacy is [facility] [place]. Patient phone number is [phone].

## 2023-11-18 NOTE — Telephone Encounter (Signed)
 Yes I will refill albuterol neb  Please contact office for sooner follow up if symptoms do not improve or worsen or seek emergency care

## 2023-11-18 NOTE — Telephone Encounter (Signed)
 Spoke with pt , no concerns

## 2023-11-19 ENCOUNTER — Other Ambulatory Visit (HOSPITAL_COMMUNITY): Payer: Self-pay

## 2023-11-19 ENCOUNTER — Telehealth: Payer: Self-pay

## 2023-11-19 ENCOUNTER — Encounter: Payer: Self-pay | Admitting: Internal Medicine

## 2023-11-19 MED ORDER — ALBUTEROL SULFATE (2.5 MG/3ML) 0.083% IN NEBU: 2.5000 mg | INHALATION_SOLUTION | Freq: Four times a day (QID) | RESPIRATORY_TRACT | 5 refills | Status: AC | PRN

## 2023-11-19 NOTE — Addendum Note (Signed)
 Addended by: Gay Filler T on: 11/19/2023 04:49 PM   Modules accepted: Orders

## 2023-11-19 NOTE — Telephone Encounter (Signed)
 Inhaler and nebulizer solution refilled.  Nothing further needed.

## 2023-11-19 NOTE — Telephone Encounter (Signed)
 Order resent

## 2023-11-19 NOTE — Telephone Encounter (Signed)
*  Pulm  Pharmacy Patient Advocate Encounter  Received notification from [facility] that Prior Authorization for Albuterol Sulfate (2.5 MG/3ML)0.083% nebulizer solution  has been CANCELLED due to must be processed by Medicare Part B

## 2023-11-21 ENCOUNTER — Other Ambulatory Visit: Payer: Self-pay | Admitting: Family Medicine

## 2023-11-21 ENCOUNTER — Encounter: Payer: Self-pay | Admitting: Internal Medicine

## 2023-11-21 ENCOUNTER — Other Ambulatory Visit: Payer: Self-pay | Admitting: *Deleted

## 2023-11-21 DIAGNOSIS — E039 Hypothyroidism, unspecified: Secondary | ICD-10-CM

## 2023-11-21 NOTE — Telephone Encounter (Signed)
 Needs labwork 30 days given 10/23/23

## 2023-11-21 NOTE — Telephone Encounter (Signed)
 Lab appt made for 11/22/23

## 2023-11-22 ENCOUNTER — Inpatient Hospital Stay: Payer: Medicare Other | Attending: Adult Health

## 2023-11-22 ENCOUNTER — Ambulatory Visit (HOSPITAL_COMMUNITY)
Admission: RE | Admit: 2023-11-22 | Discharge: 2023-11-22 | Disposition: A | Discharge status: Home / Self Care | Payer: Medicare Other | Source: Ambulatory Visit | Attending: Internal Medicine | Admitting: Internal Medicine

## 2023-11-22 ENCOUNTER — Other Ambulatory Visit

## 2023-11-22 DIAGNOSIS — Z87891 Personal history of nicotine dependence: Secondary | ICD-10-CM | POA: Insufficient documentation

## 2023-11-22 DIAGNOSIS — E039 Hypothyroidism, unspecified: Secondary | ICD-10-CM

## 2023-11-22 DIAGNOSIS — I2721 Secondary pulmonary arterial hypertension: Secondary | ICD-10-CM | POA: Diagnosis not present

## 2023-11-22 DIAGNOSIS — R635 Abnormal weight gain: Secondary | ICD-10-CM | POA: Insufficient documentation

## 2023-11-22 DIAGNOSIS — C3490 Malignant neoplasm of unspecified part of unspecified bronchus or lung: Secondary | ICD-10-CM | POA: Insufficient documentation

## 2023-11-22 DIAGNOSIS — R079 Chest pain, unspecified: Secondary | ICD-10-CM | POA: Insufficient documentation

## 2023-11-22 DIAGNOSIS — I700 Atherosclerosis of aorta: Secondary | ICD-10-CM | POA: Diagnosis not present

## 2023-11-22 DIAGNOSIS — Z923 Personal history of irradiation: Secondary | ICD-10-CM | POA: Insufficient documentation

## 2023-11-22 DIAGNOSIS — C7801 Secondary malignant neoplasm of right lung: Secondary | ICD-10-CM

## 2023-11-22 DIAGNOSIS — J432 Centrilobular emphysema: Secondary | ICD-10-CM | POA: Insufficient documentation

## 2023-11-22 DIAGNOSIS — D649 Anemia, unspecified: Secondary | ICD-10-CM | POA: Insufficient documentation

## 2023-11-22 DIAGNOSIS — C3412 Malignant neoplasm of upper lobe, left bronchus or lung: Secondary | ICD-10-CM | POA: Diagnosis not present

## 2023-11-22 DIAGNOSIS — J479 Bronchiectasis, uncomplicated: Secondary | ICD-10-CM | POA: Diagnosis not present

## 2023-11-22 DIAGNOSIS — C61 Malignant neoplasm of prostate: Secondary | ICD-10-CM | POA: Insufficient documentation

## 2023-11-22 DIAGNOSIS — Z95828 Presence of other vascular implants and grafts: Secondary | ICD-10-CM

## 2023-11-22 DIAGNOSIS — Z79890 Hormone replacement therapy (postmenopausal): Secondary | ICD-10-CM | POA: Diagnosis not present

## 2023-11-22 LAB — CBC WITH DIFFERENTIAL (CANCER CENTER ONLY)
Abs Immature Granulocytes: 0.04 10*3/uL (ref 0.00–0.07)
Basophils Absolute: 0 10*3/uL (ref 0.0–0.1)
Basophils Relative: 1 %
Eosinophils Absolute: 0.1 10*3/uL (ref 0.0–0.5)
Eosinophils Relative: 3 %
HCT: 31.8 % — ABNORMAL LOW (ref 39.0–52.0)
Hemoglobin: 11.4 g/dL — ABNORMAL LOW (ref 13.0–17.0)
Immature Granulocytes: 1 %
Lymphocytes Relative: 17 %
Lymphs Abs: 0.6 10*3/uL — ABNORMAL LOW (ref 0.7–4.0)
MCH: 34.7 pg — ABNORMAL HIGH (ref 26.0–34.0)
MCHC: 35.8 g/dL (ref 30.0–36.0)
MCV: 96.7 fL (ref 80.0–100.0)
Monocytes Absolute: 0.6 10*3/uL (ref 0.1–1.0)
Monocytes Relative: 16 %
Neutro Abs: 2.4 10*3/uL (ref 1.7–7.7)
Neutrophils Relative %: 62 %
Platelet Count: 128 10*3/uL — ABNORMAL LOW (ref 150–400)
RBC: 3.29 MIL/uL — ABNORMAL LOW (ref 4.22–5.81)
RDW: 11.5 % (ref 11.5–15.5)
WBC Count: 3.7 10*3/uL — ABNORMAL LOW (ref 4.0–10.5)
nRBC: 0 % (ref 0.0–0.2)

## 2023-11-22 LAB — CMP (CANCER CENTER ONLY)
ALT: 13 U/L (ref 0–44)
AST: 21 U/L (ref 15–41)
Albumin: 3.9 g/dL (ref 3.5–5.0)
Alkaline Phosphatase: 69 U/L (ref 38–126)
Anion gap: 5 (ref 5–15)
BUN: 17 mg/dL (ref 8–23)
CO2: 26 mmol/L (ref 22–32)
Calcium: 8.7 mg/dL — ABNORMAL LOW (ref 8.9–10.3)
Chloride: 111 mmol/L (ref 98–111)
Creatinine: 0.96 mg/dL (ref 0.61–1.24)
GFR, Estimated: >60 mL/min (ref >60)
Glucose, Bld: 108 mg/dL — ABNORMAL HIGH (ref 70–99)
Potassium: 4.3 mmol/L (ref 3.5–5.1)
Sodium: 142 mmol/L (ref 135–145)
Total Bilirubin: 0.4 mg/dL (ref 0.0–1.2)
Total Protein: 6.7 g/dL (ref 6.5–8.1)

## 2023-11-22 MED ORDER — SODIUM CHLORIDE 0.9% FLUSH
10.0000 mL | Freq: Once | INTRAVENOUS | Status: AC
  Administered 2023-11-22: 10 mL

## 2023-11-22 MED ORDER — HEPARIN SOD (PORK) LOCK FLUSH 100 UNIT/ML IV SOLN
INTRAVENOUS | Status: AC
  Filled 2023-11-22: qty 5

## 2023-11-22 MED ORDER — HEPARIN SOD (PORK) LOCK FLUSH 100 UNIT/ML IV SOLN
500.0000 [IU] | Freq: Once | INTRAVENOUS | Status: AC
  Administered 2023-11-22: 500 [IU] via INTRAVENOUS

## 2023-11-23 LAB — THYROID PANEL WITH TSH
Free Thyroxine Index: 2.2 (ref 1.2–4.9)
T3 Uptake Ratio: 29 % (ref 24–39)
T4, Total: 7.7 ug/dL (ref 4.5–12.0)
TSH: 0.241 u[IU]/mL — ABNORMAL LOW (ref 0.450–4.500)

## 2023-11-25 ENCOUNTER — Ambulatory Visit (INDEPENDENT_AMBULATORY_CARE_PROVIDER_SITE_OTHER): Payer: Medicare Other

## 2023-11-25 ENCOUNTER — Telehealth: Payer: Self-pay | Admitting: Family Medicine

## 2023-11-25 VITALS — Ht 74.0 in | Wt 259.0 lb

## 2023-11-25 DIAGNOSIS — Z0000 Encounter for general adult medical examination without abnormal findings: Secondary | ICD-10-CM | POA: Diagnosis not present

## 2023-11-25 NOTE — Telephone Encounter (Unsigned)
 Copied from CRM [id number]. Topic: General - Call Back - No Documentation >> Nov 25, 2023  3:50 PM Higinio Roger wrote: Reason for CRM: Patient would like a callback from Kandis Fantasia to discuss healthcare power of attorney.  Callback #: [phone]

## 2023-11-25 NOTE — Patient Instructions (Signed)
 Travis Padilla , Thank you for taking time to come for your Medicare Wellness Visit. I appreciate your ongoing commitment to your health goals. Please review the following plan we discussed and let me know if I can assist you in the future.   Referrals/Orders/Follow-Ups/Clinician Recommendations: Aim for 30 minutes of exercise or brisk walking, 6-8 glasses of water, and 5 servings of fruits and vegetables each day.  This is a list of the screening recommended for you and due dates:  Health Maintenance  Topic Date Due   Hepatitis C Screening  Never done   COVID-19 Vaccine (5 - 2024-25 season) 08/14/2023   Medicare Annual Wellness Visit  11/24/2024   DTaP/Tdap/Td vaccine (2 - Tdap) 02/22/2025   Colon Cancer Screening  02/12/2033   Pneumonia Vaccine  Completed   Flu Shot  Completed   Zoster (Shingles) Vaccine  Completed   HPV Vaccine  Aged Out    Advanced directives: (In Chart) A copy of your advanced directives are scanned into your chart should your provider ever need it.  Next Medicare Annual Wellness Visit scheduled for next year: Yes

## 2023-11-25 NOTE — Progress Notes (Signed)
 Subjective:   Travis Padilla is a 78 y.o. who presents for a Medicare Wellness preventive visit.  Visit Complete: Virtual I connected with  Travis Padilla on 11/25/23 by a audio enabled telemedicine application and verified that I am speaking with the correct person using two identifiers.  Patient Location: Home  Provider Location: Home Office  I discussed the limitations of evaluation and management by telemedicine. The patient expressed understanding and agreed to proceed.  Vital Signs: Because this visit was a virtual/telehealth visit, some criteria may be missing or patient reported. Any vitals not documented were not able to be obtained and vitals that have been documented are patient reported.  VideoDeclined- This patient declined Librarian, academic. Therefore the visit was completed with audio only.  Persons Participating in Visit: Patient.  AWV Questionnaire: No: Patient Medicare AWV questionnaire was not completed prior to this visit.  Cardiac Risk Factors include: advanced age (>61men, >83 women);dyslipidemia;hypertension;male gender     Objective:    Today's Vitals   11/25/23 1417  Weight: 259 lb (117.5 kg)  Height: 6\' 2"  (1.88 m)   Body mass index is 33.25 kg/m.     08/19/2023   11:03 AM 04/17/2023   10:55 AM 03/11/2023    5:34 PM 02/22/2023   12:53 PM 11/22/2022    2:23 PM 03/20/2022    9:40 AM 12/26/2021    1:46 PM  Advanced Directives  Does Patient Have a Medical Advance Directive? Yes Yes Yes Yes Yes Yes No  Type of Aeronautical engineer of [place];Living will Healthcare Power of Travis Padilla;Living will Healthcare Power of [place];Living will   Does patient want to make changes to medical advance directive?  No - Patient declined   No - Patient declined No - Patient declined No - Patient declined  Copy of Healthcare Power of Attorney in Chart?     Yes - validated most recent copy scanned in chart (See row information) No  - copy requested   Would patient like information on creating a medical advance directive?       No - Patient declined    Current Medications (verified) Outpatient Encounter Medications as of 11/25/2023  Medication Sig   acetaminophen (TYLENOL) 500 MG tablet Take 1,000 mg by mouth every 6 (six) hours as needed for moderate pain.   albuterol (PROVENTIL) (2.5 MG/3ML) 0.083% nebulizer solution Take 3 mLs (2.5 mg total) by nebulization every 6 (six) hours as needed for wheezing or shortness of breath.   albuterol (VENTOLIN HFA) 108 (90 Base) MCG/ACT inhaler INHALE 2 PUFFS BY MOUTH EVERY 6 HOURS AS NEEDED   Artificial Tear Solution (SOOTHE XP OP) Place 1 drop into both eyes 2 (two) times daily.   b complex vitamins capsule Take 1 capsule by mouth daily.   carvedilol (COREG) 25 MG tablet TAKE 1 TABLET (25 MG TOTAL) BY MOUTH TWICE A DAY WITH MEALS   clopidogrel (PLAVIX) 75 MG tablet TAKE 1 TABLET BY MOUTH EVERY DAY   [employer] IRON 325 (65 Fe) MG tablet TAKE 1 TABLET BY MOUTH DAILY (Patient taking differently: Take 325 mg by mouth daily with breakfast.)   levothyroxine (SYNTHROID) 150 MCG tablet Take 1 tablet (150 mcg total) by mouth daily. **NEEDS LABWORK DONE**   methocarbamol (ROBAXIN) 500 MG tablet Take 500 mg by mouth every 6 (six) hours as needed for muscle spasms.   Multiple Vitamin (MULTIVITAMIN) capsule Take 1 capsule by mouth daily.   nitroGLYCERIN (NITROSTAT) 0.4 MG  SL tablet Place 1 tablet (0.4 mg total) under the tongue every 5 (five) minutes as needed for chest pain.   oxyCODONE (ROXICODONE) 5 MG immediate release tablet Take 0.5 tablets (2.5 mg total) by mouth every 6 (six) hours as needed for severe pain (pain score 7-10).   pantoprazole (PROTONIX) 40 MG tablet TAKE 1 TABLET BY MOUTH EVERY DAY BEFORE BREAKFAST   Polyethylene Glycol 3350 (MIRALAX PO) Take by mouth daily.   PRALUENT 150 MG/ML SOAJ INJECT 150 MG INTO THE SKIN EVERY 14 (FOURTEEN) DAYS.   predniSONE (STERAPRED UNI-PAK 21 TAB) 5  MG (21) TBPK tablet Take 5 mg by mouth daily.   Probiotic Product (ALIGN PO) Take 1 tablet by mouth 2 (two) times daily. gummy   tamsulosin (FLOMAX) 0.4 MG CAPS capsule Take 0.4 mg by mouth at bedtime.   umeclidinium bromide (INCRUSE ELLIPTA) 62.5 MCG/ACT AEPB Inhale 1 puff into the lungs daily.   No facility-administered encounter medications on file as of 11/25/2023.    Allergies (verified) Iodinated contrast media, Iohexol, and Pravastatin   History: Past Medical History:  Diagnosis Date   Arthritis    Benign localized prostatic hyperplasia with lower urinary tract symptoms (LUTS)    CAD (coronary artery disease) 2013   cardiologist--- dr [name];   08-26-2012 cath for poss ischemia on NUC-- moderate nonob CAD, aggressive medical manage;  staged cardiac cath 09/ 2022 PCI, DES x1 to mLAD (80%) ;   arthrectomy and DES prox-mid RCA Mild nonobstructive plaque in cath 2013   Chronic constipation    COPD with emphysema [facility])    pulmonology--- dr b. icard/ tammy parrett NP;   no oxygen, no daily inhaler   Diverticulosis of colon    w/ hx diverticulitis   Dyslipidemia    GAD (generalized anxiety disorder)    GERD (gastroesophageal reflux disease)    Hemorrhoids    internal and external   History of adenomatous polyp of colon    followed by dr Leone Payor   History of antineoplastic chemotherapy    History of basal cell carcinoma (BCC) excision    2019  s/p MOH's nose   History of GI bleed 05/2021   in setting post op cardiac cath PCI / stenting on blood thinnner/ 05-27-2021 Hg 7.9,  EGD done 05-30-2023  showed erosive gastrophy no active bleeding,  received x3 PRBC and IV iron infusions   History of gout    History of kidney stones    History of palpitations    event monitor--- 11-21-2021  showed infrequent NSVT, ST   History of radiation therapy    pallitive radition to right lung and left axilla 04-30-2018  to 05-21-2018;   enlarging early stage LUL mass SBRT  01-09-2022  to  01-19-2022   Hypertension    Hypothyroidism (acquired)    followed by pcp   IDA (iron deficiency anemia)    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   urologist--- dr gay/  radiation onologist--- dr Kathrynn Running;  dx 05/ 2024, gleason 4+3   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) [facility]) dx'd 04/17/18   to LN, infrahilar mass, lung nodule and lt axilla   Nephrolithiasis    per CT 02-21-2023 in epic bilateral renal calucli nonobstructive   OSA (obstructive sleep apnea)    sleep study 03-22-2021 in epic , mild osa AHI 8.2/hr  cpap intolerant   Port-A-Cath in place    Squamous cell carcinoma of lung, stage IV, 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   oncologist--- dr Arbutus Ped  radiation onologist-- dr Mitzi Hansen;  dx by needle bx;  Non-small cell lung cancer SCC w/ large right infrahilar mass in addition left upper lobe nodule & left axilla mass w/ left axilla lymph node;  treated w/ pallitive radiation , systemic chemo, maintenance immunotherapy discontinued due to intolerence 03/ 2022   Past Surgical History:  Procedure Laterality Date   BELPHAROPTOSIS REPAIR Right    1990s   CARDIAC CATHETERIZATION     CATARACT EXTRACTION W/ INTRAOCULAR LENS  IMPLANT, BILATERAL  2004   COLONOSCOPY N/A 11/03/2014   Procedure: COLONOSCOPY;  Surgeon: Malissa Hippo, MD;  [facility];  Service: Endoscopy;  Laterality: N/A;  1225   COLONOSCOPY WITH PROPOFOL  02/13/2023   dr Leone Payor   CORONARY ATHERECTOMY N/A 05/26/2021   Procedure: CORONARY ATHERECTOMY;  Surgeon: [name], MD;  [facility]: [facility];  Service: Cardiovascular;  Laterality: N/A;   CORONARY STENT INTERVENTION N/A 05/25/2021   Procedure: CORONARY STENT INTERVENTION;  Surgeon: [name], MD;  Location: [facility];  Service: Cardiovascular;  Laterality: N/A;   CORONARY STENT INTERVENTION N/A 05/26/2021   Procedure: CORONARY STENT INTERVENTION;  Surgeon: [name], MD;  [facility];  Service:  Cardiovascular;  Laterality: N/A;   CORONARY ULTRASOUND/IVUS N/A 05/26/2021   Procedure: Intravascular Ultrasound/IVUS;  Surgeon: [name], MD;  [facility];  Service: Cardiovascular;  Laterality: N/A;   ELBOW BURSA SURGERY Left 11/2005   ESOPHAGOGASTRODUODENOSCOPY (EGD) WITH PROPOFOL N/A 05/29/2021   Procedure: ESOPHAGOGASTRODUODENOSCOPY (EGD) WITH PROPOFOL;  Surgeon: [name], MD;  Location: [facility]COPY;  Service: Endoscopy;  Laterality: N/A;   EXTRACORPOREAL SHOCK WAVE LITHOTRIPSY Right 05/02/2015   @[facility]    GOLD SEED IMPLANT N/A 04/17/2023   Procedure: GOLD SEED IMPLANT;  Surgeon: Jannifer Hick, MD;  Location: [facility];  Service: Urology;  Laterality: N/A;   IR CV LINE INJECTION  08/24/2020   IR FLUORO GUIDED NEEDLE PLC ASPIRATION/INJECTION LOC  11/28/2020   IR IMAGING GUIDED PORT INSERTION  07/11/2018   KNEE ARTHROSCOPY W/ MENISCAL REPAIR Left 1995   LEFT HEART CATH AND CORONARY ANGIOGRAPHY N/A 05/25/2021   Procedure: LEFT HEART CATH AND CORONARY ANGIOGRAPHY;  Surgeon: [name], MD;  [facility];  Service: Cardiovascular;  Laterality: N/A;   OLECRANON BURSECTOMY Left 06/06/2006   @[facility]  by dr Bea Laura. Renae Fickle;   revision radial left elbow   SPACE OAR INSTILLATION N/A 04/17/2023   Procedure: SPACE OAR INSTILLATION;  Surgeon: Jannifer Hick, MD;  Location: [facility];  Service: Urology;  Laterality: N/A;   TEMPORARY PACEMAKER N/A 05/26/2021   Procedure: TEMPORARY PACEMAKER;  Surgeon: [name], MD;  [facility];  Service: Cardiovascular;  Laterality: N/A;   TONSILLECTOMY     child   TRANSURETHRAL RESECTION OF BLADDER TUMOR  02/17/2010   @[facility]  by dr Retta Diones   VIDEO BRONCHOSCOPY Bilateral 05/09/2018   Procedure: VIDEO BRONCHOSCOPY WITH FLUORO;  Surgeon: [name], MD;  Location: [facility]OS[facility];  Service: Cardiopulmonary;  Laterality: Bilateral;   Family History  Problem Relation  Age of Onset   Heart failure Mother    Parkinson's disease Mother    Heart attack Father    Healthy Sister    Skin cancer Sister    Healthy Sister    Healthy Sister    Neuropathy Brother    COPD Brother    Epilepsy Brother    Colon cancer Neg  Hx    Pancreatic cancer Neg Hx    Prostate cancer Neg Hx    Rectal cancer Neg Hx    Stomach cancer Neg Hx    Social History   Socioeconomic History   Marital status: Widowed    Spouse name: Not on file   Number of children: 1   Years of education: Not on file   Highest education level: Not on file  Occupational History   Occupation: Emergency planning/management officer  Tobacco Use   Smoking status: Former    Current packs/day: 0.00    Average packs/day: 0.2 packs/day for 2.0 years (0.4 ttl pk-yrs)    Types: Cigarettes    Start date: 10/01/1968    Quit date: 110    Years since quitting: 53.2   Smokeless tobacco: Never  Vaping Use   Vaping status: Never Used  Substance and Sexual Activity   Alcohol use: Not Currently    Comment: occasional   Drug use: Never   Sexual activity: Not on file  Other Topics Concern   Not on file  Social History Narrative   Unable to ask intimate partner violence questions, wife present.    Pt's wife died in 2021-09-24.   Social Drivers of Corporate investment banker Strain: Low Risk  (11/25/2023)   Overall Financial Resource Strain (CARDIA)    Difficulty of Paying Living Expenses: Not hard at all  Food Insecurity: No Food Insecurity (11/25/2023)   Hunger Vital Sign    Worried About Running Out of Food in the Last Year: Never true    Ran Out of Food in the Last Year: Never true  Transportation Needs: No Transportation Needs (11/25/2023)   PRAPARE - Administrator, Civil Service (Medical): No    Lack of Transportation (Non-Medical): No  Physical Activity: Sufficiently Active (11/25/2023)   Exercise Vital Sign    Days of Exercise per Week: 5 days    Minutes of Exercise per Session: 30 min  Stress: No  Stress Concern Present (11/25/2023)   [employer] of Occupational Health - Occupational Stress Questionnaire    Feeling of Stress : Not at all  Social Connections: Moderately Isolated (11/25/2023)   Social Connection and Isolation Panel [NHANES]    Frequency of Communication with Friends and Family: More than three times a week    Frequency of Social Gatherings with Friends and Family: Three times a week    Attends Religious Services: Never    Active Member of Clubs or Organizations: Yes    Attends Banker Meetings: More than 4 times per year    Marital Status: Widowed    Tobacco Counseling Counseling given: Not Answered    Clinical Intake:  Pre-visit preparation completed: Yes  Pain : No/denies pain     Diabetes: No  How often do you need to have someone help you when you read instructions, pamphlets, or other written materials from your doctor or pharmacy?: 1 - Never  Interpreter Needed?: No  Information entered by :: Kandis Fantasia LPN   Activities of Daily Living     11/25/2023    2:17 PM 04/17/2023   11:16 AM  In your present state of health, do you have any difficulty performing the following activities:  Hearing? 0 0  Vision? 0 0  Difficulty concentrating or making decisions? 0 0  Walking or climbing stairs? 0 0  Dressing or bathing? 0 0  Doing errands, shopping? 0   Preparing Food and eating ? N  Using the Toilet? N   In the past six months, have you accidently leaked urine? N   Do you have problems with loss of bowel control? N   Managing your Medications? N   Managing your Finances? N   Housekeeping or managing your Housekeeping? N     Patient Care Team: [name], DO as PCP - General (Family Medicine) [name], MD as PCP - Cardiology (Cardiology) Mathews Robinsons, MD as Consulting Physician (Dermatology) Beverely Low, MD as Consulting Physician (Orthopedic Surgery) Canary Brim, OD as Referring Physician  (Ophthalmology) Malissa Hippo, MD (Inactive) as Consulting Physician (Gastroenterology) [name], MD as Consulting Physician (Cardiology) Jannifer Hick, MD as Consulting Physician (Urology) Margaretmary Dys, MD as Consulting Physician (Radiation Oncology) Maryclare Labrador, RN as Registered Nurse Si Gaul, MD as Consulting Physician (Oncology)  Indicate any recent Medical Services you may have received from other than [facility] providers in the past year (date may be approximate).     Assessment:   This is a routine wellness examination for Larenzo.  Hearing/Vision screen No results found.   Goals Addressed             This Visit's Progress    DIET - EAT MORE FRUITS AND VEGETABLES   On track      Depression Screen     11/25/2023    3:22 PM 08/27/2023    9:57 AM 06/19/2023   11:11 AM 03/19/2023    3:28 PM 11/22/2022    2:22 PM 09/11/2022    2:14 PM 04/18/2022   11:05 AM  PHQ 2/9 Scores  PHQ - 2 Score 0 0 0 0 0 0 1  PHQ- 9 Score  0 0 0  3 2    Fall Risk     11/25/2023    3:23 PM 10/22/2023    8:58 AM 06/19/2023   11:11 AM 03/19/2023    3:19 PM 11/22/2022    2:20 PM  Fall Risk   Falls in the past year? 0 0 0 0 0  Number falls in past yr: 0  0 0 0  Injury with Fall? 0  0 0 0  Risk for fall due to : No Fall Risks  No Fall Risks No Fall Risks No Fall Risks  Follow up Falls prevention discussed;Education provided;Falls evaluation completed  Education provided Education provided Falls prevention discussed    MEDICARE RISK AT HOME:  Medicare Risk at Home Any stairs in or around the home?: No If so, are there any without handrails?: No Home free of loose throw rugs in walkways, pet beds, electrical cords, etc?: Yes Adequate lighting in your home to reduce risk of falls?: Yes Life alert?: No Use of a cane, walker or w/c?: No Grab bars in the bathroom?: Yes Shower chair or bench in shower?: No Elevated toilet seat or a handicapped toilet?: Yes  TIMED UP AND  GO:  Was the test performed?  No  Cognitive Function: 6CIT completed    04/08/2018   10:35 AM 10/04/2016   10:11 AM  MMSE - Mini Mental State Exam  Orientation to time 5 5  Orientation to Place 5 5  Registration 3 3  Attention/ Calculation 5 5  Recall 3 2  Language- name 2 objects 2 2  Language- repeat 1 1  Language- follow 3 step command 3 3  Language- read & follow direction 1 1  Write a sentence 1 1  Copy design 1 1  Total score 30  29        11/25/2023    3:24 PM 11/22/2022    2:24 PM 10/04/2021   10:46 AM  6CIT Screen  What Year? 0 points 0 points 0 points  What month? 0 points 0 points 0 points  What time? 0 points 0 points 0 points  Count back from 20 0 points 0 points 0 points  Months in reverse 0 points 0 points 2 points  Repeat phrase 0 points 0 points 0 points  Total Score 0 points 0 points 2 points    Immunizations Immunization History  Administered Date(s) Administered   Fluad Trivalent(High Dose 65+) 06/19/2023   Influenza Nasal 06/29/2016   Influenza Split 05/14/2012   Influenza, High Dose Seasonal PF 08/03/2014, 05/29/2015, 07/22/2017, 06/11/2018, 05/05/2019, 06/28/2020, 05/16/2021, 04/24/2022   Influenza-Unspecified 06/29/2016   PFIZER Comirnaty(Gray Top)Covid-19 Tri-Sucrose Vaccine 02/14/2021   PFIZER(Purple Top)SARS-COV-2 Vaccination 10/15/2019, 06/13/2020, 06/19/2023   PNEUMOCOCCAL CONJUGATE-20 04/18/2022   Pfizer Covid-19 Vaccine Bivalent Booster 30yrs & up 06/19/2023   Pneumococcal Conjugate-13 03/30/2015   Pneumococcal Polysaccharide-23 07/21/2012   Td 02/23/2015   Zoster Recombinant(Shingrix) 11/01/2021, 04/18/2022   Zoster, Live 07/21/2012    Screening Tests Health Maintenance  Topic Date Due   Hepatitis C Screening  Never done   COVID-19 Vaccine (5 - 2024-25 season) 08/14/2023   Medicare Annual Wellness (AWV)  11/24/2024   DTaP/Tdap/Td (2 - Tdap) 02/22/2025   Colonoscopy  02/12/2033   Pneumonia Vaccine 32+ Years old  Completed    INFLUENZA VACCINE  Completed   Zoster Vaccines- Shingrix  Completed   HPV VACCINES  Aged Out    Health Maintenance  Health Maintenance Due  Topic Date Due   Hepatitis C Screening  Never done   COVID-19 Vaccine (5 - 2024-25 season) 08/14/2023    Additional Screening:  Vision Screening: Recommended annual ophthalmology exams for early detection of glaucoma and other disorders of the eye.  Dental Screening: Recommended annual dental exams for proper oral hygiene  Community Resource Referral / Chronic Care Management: CRR required this visit?  No   CCM required this visit?  No     Plan:     I have personally reviewed and noted the following in the patient's chart:   Medical and social history Use of alcohol, tobacco or illicit drugs  Current medications and supplements including opioid prescriptions. Patient is not currently taking opioid prescriptions. Functional ability and status Nutritional status Physical activity Advanced directives List of other physicians Hospitalizations, surgeries, and ER visits in previous 12 months Vitals Screenings to include cognitive, depression, and falls Referrals and appointments  In addition, I have reviewed and discussed with patient certain preventive protocols, quality metrics, and best practice recommendations. A written personalized care plan for preventive services as well as general preventive health recommendations were provided to patient.     Kandis Fantasia [place], [place]   [id number]/4696   After Visit Summary: (MyChart) Due to this being a telephonic visit, the after visit summary with patients personalized plan was offered to patient via MyChart   Notes: Nothing significant to report at this time.

## 2023-11-26 ENCOUNTER — Encounter: Payer: Self-pay | Admitting: Family Medicine

## 2023-11-26 ENCOUNTER — Other Ambulatory Visit: Payer: Self-pay | Admitting: Family Medicine

## 2023-11-26 DIAGNOSIS — E039 Hypothyroidism, unspecified: Secondary | ICD-10-CM

## 2023-11-26 MED ORDER — LEVOTHYROXINE SODIUM 137 MCG PO TABS
137.0000 ug | ORAL_TABLET | Freq: Every day | ORAL | 0 refills | Status: DC
Start: 2023-11-26 — End: 2024-02-04

## 2023-11-27 ENCOUNTER — Other Ambulatory Visit: Payer: Self-pay

## 2023-11-27 DIAGNOSIS — E039 Hypothyroidism, unspecified: Secondary | ICD-10-CM

## 2023-12-05 ENCOUNTER — Inpatient Hospital Stay (HOSPITAL_BASED_OUTPATIENT_CLINIC_OR_DEPARTMENT_OTHER): Payer: Medicare Other | Admitting: Internal Medicine

## 2023-12-05 ENCOUNTER — Telehealth: Payer: Self-pay | Admitting: Cardiology

## 2023-12-05 VITALS — BP 121/70 | HR 85 | Temp 98.0°F | Resp 17 | Ht 74.0 in | Wt 258.4 lb

## 2023-12-05 DIAGNOSIS — I2721 Secondary pulmonary arterial hypertension: Secondary | ICD-10-CM | POA: Diagnosis not present

## 2023-12-05 DIAGNOSIS — J432 Centrilobular emphysema: Secondary | ICD-10-CM | POA: Diagnosis not present

## 2023-12-05 DIAGNOSIS — C61 Malignant neoplasm of prostate: Secondary | ICD-10-CM | POA: Diagnosis not present

## 2023-12-05 DIAGNOSIS — R635 Abnormal weight gain: Secondary | ICD-10-CM | POA: Diagnosis not present

## 2023-12-05 DIAGNOSIS — C3490 Malignant neoplasm of unspecified part of unspecified bronchus or lung: Secondary | ICD-10-CM | POA: Diagnosis not present

## 2023-12-05 DIAGNOSIS — C3412 Malignant neoplasm of upper lobe, left bronchus or lung: Secondary | ICD-10-CM | POA: Diagnosis not present

## 2023-12-05 DIAGNOSIS — D649 Anemia, unspecified: Secondary | ICD-10-CM | POA: Diagnosis not present

## 2023-12-05 NOTE — Telephone Encounter (Signed)
 Patient called and said that he has lung cancer and just had a CT scan done. 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 Patient want to make sure that it is something that Dr. [name] don't need to make an appt for
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
## 2023-12-06 ENCOUNTER — Encounter: Payer: Self-pay | Admitting: Cardiology

## 2023-12-09 ENCOUNTER — Other Ambulatory Visit: Payer: Self-pay | Admitting: Family Medicine

## 2023-12-17 NOTE — Telephone Encounter (Signed)
 Travis Rotunda, MD  [name], RN11 days ago    This is not a specific finding on CT and would go along with any lung disease.  Not worrisome.  Tell him I am hoping he is doing OK and does well with therapy.   Attempted to call patient, no answer left message requesting a call back.

## 2023-12-18 NOTE — Telephone Encounter (Signed)
 Patient identification verified by 2 forms. Marilynn Rail, RN    Called and spoke to patient  Relayed provider message below  Patient verbalized understanding, no questions at this time

## 2023-12-18 NOTE — Telephone Encounter (Signed)
 Patient returned RN's call.

## 2023-12-18 NOTE — Telephone Encounter (Signed)
 2nd attempt to call patient, no answer left message requesting a call back.

## 2024-01-02 ENCOUNTER — Other Ambulatory Visit: Payer: Self-pay | Admitting: Family Medicine

## 2024-01-02 MED ORDER — ALBUTEROL SULFATE HFA 108 (90 BASE) MCG/ACT IN AERS: 2.0000 | INHALATION_SPRAY | Freq: Four times a day (QID) | RESPIRATORY_TRACT | 1 refills | Status: DC | PRN

## 2024-01-02 NOTE — Telephone Encounter (Signed)
 Appt made for June please send refill to [facility]

## 2024-01-02 NOTE — Telephone Encounter (Signed)
 [place] pt NTBS 30-d given 12/09/23

## 2024-01-02 NOTE — Addendum Note (Signed)
 Addended by: Adela Esteban D on: 01/02/2024 08:47 AM   Modules accepted: Orders

## 2024-01-10 ENCOUNTER — Ambulatory Visit (INDEPENDENT_AMBULATORY_CARE_PROVIDER_SITE_OTHER): Payer: Medicare Other | Admitting: Emergency Medicine

## 2024-01-10 ENCOUNTER — Ambulatory Visit: Payer: Medicare Other | Admitting: Emergency Medicine

## 2024-01-10 ENCOUNTER — Encounter: Payer: Self-pay | Admitting: Emergency Medicine

## 2024-01-10 VITALS — BP 120/72 | HR 87 | Temp 98.2°F | Ht 72.0 in | Wt 256.0 lb

## 2024-01-10 DIAGNOSIS — J449 Chronic obstructive pulmonary disease, unspecified: Secondary | ICD-10-CM | POA: Diagnosis not present

## 2024-01-10 DIAGNOSIS — C3491 Malignant neoplasm of unspecified part of right bronchus or lung: Secondary | ICD-10-CM

## 2024-01-10 DIAGNOSIS — Z87891 Personal history of nicotine dependence: Secondary | ICD-10-CM

## 2024-01-10 DIAGNOSIS — R0609 Other forms of dyspnea: Secondary | ICD-10-CM | POA: Diagnosis not present

## 2024-01-10 DIAGNOSIS — R0600 Dyspnea, unspecified: Secondary | ICD-10-CM | POA: Insufficient documentation

## 2024-01-10 LAB — PULMONARY FUNCTION TEST
DL/VA % pred: 85 %
DL/VA: 3.34 ml/min/mmHg/L
DLCO unc % pred: 70 %
DLCO unc: 18.61 ml/min/mmHg
FEF 25-75 Pre: 1.72 L/s
FEF2575-%Pred-Pre: 72 %
FEV1-%Pred-Pre: 80 %
FEV1-Pre: 2.65 L
FEV1FVC-%Pred-Pre: 97 %
FEV6-%Pred-Pre: 86 %
FEV6-Pre: 3.71 L
FEV6FVC-%Pred-Pre: 105 %
FVC-%Pred-Pre: 82 %
FVC-Pre: 3.74 L
Pre FEV1/FVC ratio: 71 %
Pre FEV6/FVC Ratio: 99 %
RV % pred: 62 %
RV: 1.67 L
TLC % pred: 75 %
TLC: 5.62 L

## 2024-01-10 MED ORDER — BREZTRI AEROSPHERE 160-9-4.8 MCG/ACT IN AERO: 2.0000 | INHALATION_SPRAY | Freq: Two times a day (BID) | RESPIRATORY_TRACT | 11 refills | Status: DC

## 2024-01-10 NOTE — Patient Instructions (Signed)
 Full PFT without post bronchodilator performed today

## 2024-01-10 NOTE — Progress Notes (Signed)
 Full PFT without post bronchodilator performed today
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
## 2024-01-10 NOTE — Assessment & Plan Note (Signed)
 Get your CT scan of the chest and follow with Dr. Marguerita Shih as planned
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
## 2024-01-10 NOTE — Patient Instructions (Signed)
 Stop Incruse for now We will try starting Breztri 2 puffs twice a day.  Rinse and gargle after using. Keep your albuterol  available to use 2 puffs when needed for shortness of breath, chest tightness, wheezing. Continue your exercise routine.  You could consider taking 2 puffs of albuterol  about 15 minutes prior to exercise to see if it makes your breathing better when you work out Get your CT scan of the chest and follow with Dr. Marguerita Shih as planned Follow with Dr Baldwin Levee in 6 months or sooner if you have any problems

## 2024-01-10 NOTE — Assessment & Plan Note (Signed)
 Mild obstruction on his PFT today but a 20% decrease in the FEV1 compared with his prior baseline.  He is currently on Incruse, did get some benefit.  I like to change him to [place] especially since he is having sputum production.  The ICS may help with this.  Stop Incruse for now We will try starting Breztri 2 puffs twice a day.  Rinse and gargle after using. Keep your albuterol  available to use 2 puffs when needed for shortness of breath, chest tightness, wheezing. Continue your exercise routine.  You could consider taking 2 puffs of albuterol  about 15 minutes prior to exercise to see if it makes your breathing better when you work out Follow with Dr Baldwin Levee in 6 months or sooner if you have any problems

## 2024-01-20 ENCOUNTER — Inpatient Hospital Stay: Attending: Adult Health

## 2024-01-20 DIAGNOSIS — Z95828 Presence of other vascular implants and grafts: Secondary | ICD-10-CM

## 2024-01-20 DIAGNOSIS — C3412 Malignant neoplasm of upper lobe, left bronchus or lung: Secondary | ICD-10-CM | POA: Diagnosis not present

## 2024-01-20 DIAGNOSIS — Z87891 Personal history of nicotine dependence: Secondary | ICD-10-CM | POA: Diagnosis not present

## 2024-01-20 DIAGNOSIS — C7801 Secondary malignant neoplasm of right lung: Secondary | ICD-10-CM

## 2024-01-20 MED ORDER — HEPARIN SOD (PORK) LOCK FLUSH 100 UNIT/ML IV SOLN
500.0000 [IU] | Freq: Once | INTRAVENOUS | Status: AC
  Administered 2024-01-20: 500 [IU]

## 2024-01-20 MED ORDER — SODIUM CHLORIDE 0.9% FLUSH
10.0000 mL | Freq: Once | INTRAVENOUS | Status: AC
  Administered 2024-01-20: 10 mL

## 2024-01-21 ENCOUNTER — Other Ambulatory Visit

## 2024-01-23 DIAGNOSIS — C61 Malignant neoplasm of prostate: Secondary | ICD-10-CM | POA: Diagnosis not present

## 2024-01-30 ENCOUNTER — Other Ambulatory Visit

## 2024-01-30 DIAGNOSIS — R350 Frequency of micturition: Secondary | ICD-10-CM | POA: Diagnosis not present

## 2024-01-30 DIAGNOSIS — C61 Malignant neoplasm of prostate: Secondary | ICD-10-CM | POA: Diagnosis not present

## 2024-01-30 DIAGNOSIS — R3915 Urgency of urination: Secondary | ICD-10-CM | POA: Diagnosis not present

## 2024-01-30 DIAGNOSIS — Z23 Encounter for immunization: Secondary | ICD-10-CM | POA: Diagnosis not present

## 2024-01-30 DIAGNOSIS — N401 Enlarged prostate with lower urinary tract symptoms: Secondary | ICD-10-CM | POA: Diagnosis not present

## 2024-01-31 ENCOUNTER — Other Ambulatory Visit

## 2024-01-31 DIAGNOSIS — E039 Hypothyroidism, unspecified: Secondary | ICD-10-CM

## 2024-02-01 LAB — TSH+FREE T4
Free T4: 1.23 ng/dL (ref 0.82–1.77)
TSH: 0.093 u[IU]/mL — ABNORMAL LOW (ref 0.450–4.500)

## 2024-02-04 ENCOUNTER — Other Ambulatory Visit: Payer: Self-pay

## 2024-02-04 ENCOUNTER — Ambulatory Visit: Payer: Self-pay | Admitting: Family Medicine

## 2024-02-04 DIAGNOSIS — E039 Hypothyroidism, unspecified: Secondary | ICD-10-CM

## 2024-02-04 MED ORDER — LEVOTHYROXINE SODIUM 137 MCG PO TABS: 137.0000 ug | ORAL_TABLET | Freq: Every day | ORAL | 0 refills | Status: DC

## 2024-02-07 ENCOUNTER — Other Ambulatory Visit: Payer: Self-pay | Admitting: Cardiology

## 2024-02-10 ENCOUNTER — Telehealth: Payer: Self-pay | Admitting: Cardiology

## 2024-02-10 NOTE — Telephone Encounter (Signed)
*  STAT* If patient is at the pharmacy, call can be transferred to refill team.   1. Which medications need to be refilled? (please list name of each medication and dose if known)   clopidogrel  (PLAVIX ) 75 MG tablet   2. Would you like to learn more about the convenience, safety, & potential cost savings by using the [facility]?   3. Are you open to using the [facility] (Type [facility]. ).  4. Which pharmacy/location (including street and city if local pharmacy) is medication to be sent to?  [id number] - [place], [place] - [address]   5. Do they need a 30 day or 90 day supply?   90 day  Patient stated he had an accident with this medication and flushed it down the toilet and has been out since last Friday.

## 2024-02-10 NOTE — Telephone Encounter (Signed)
 Called pt to inform him that his medication was already sent to his preferred pharmacy and that he needed to contact his pharmacy to request a refill. I advise pt that if he has any other problems, questions or concerns, to give our office a call back. Pt verbalized understanding.

## 2024-02-12 ENCOUNTER — Ambulatory Visit: Admitting: Family Medicine

## 2024-02-12 ENCOUNTER — Telehealth: Payer: Self-pay | Admitting: Family Medicine

## 2024-02-12 ENCOUNTER — Encounter: Payer: Self-pay | Admitting: Family Medicine

## 2024-02-12 VITALS — BP 123/77 | HR 100 | Temp 97.9°F | Ht 72.0 in | Wt 254.0 lb

## 2024-02-12 DIAGNOSIS — I2510 Atherosclerotic heart disease of native coronary artery without angina pectoris: Secondary | ICD-10-CM

## 2024-02-12 DIAGNOSIS — J432 Centrilobular emphysema: Secondary | ICD-10-CM

## 2024-02-12 DIAGNOSIS — Z713 Dietary counseling and surveillance: Secondary | ICD-10-CM

## 2024-02-12 DIAGNOSIS — E66811 Obesity, class 1: Secondary | ICD-10-CM | POA: Diagnosis not present

## 2024-02-12 DIAGNOSIS — Z955 Presence of coronary angioplasty implant and graft: Secondary | ICD-10-CM

## 2024-02-12 DIAGNOSIS — E039 Hypothyroidism, unspecified: Secondary | ICD-10-CM | POA: Diagnosis not present

## 2024-02-12 DIAGNOSIS — C3491 Malignant neoplasm of unspecified part of right bronchus or lung: Secondary | ICD-10-CM | POA: Diagnosis not present

## 2024-02-12 MED ORDER — SEMAGLUTIDE-WEIGHT MANAGEMENT 2.4 MG/0.75ML ~~LOC~~ SOAJ
2.4000 mg | SUBCUTANEOUS | 4 refills | Status: DC
Start: 2024-06-07 — End: 2024-05-21

## 2024-02-12 MED ORDER — SEMAGLUTIDE-WEIGHT MANAGEMENT 1 MG/0.5ML ~~LOC~~ SOAJ
1.0000 mg | SUBCUTANEOUS | 1 refills | Status: DC
Start: 2024-04-10 — End: 2024-05-17

## 2024-02-12 MED ORDER — ONDANSETRON 4 MG PO TBDP
4.0000 mg | ORAL_TABLET | Freq: Three times a day (TID) | ORAL | 0 refills | Status: DC | PRN
Start: 2024-02-12 — End: 2024-05-14

## 2024-02-12 MED ORDER — SEMAGLUTIDE-WEIGHT MANAGEMENT 1.7 MG/0.75ML ~~LOC~~ SOAJ: 1.7000 mg | SUBCUTANEOUS | 1 refills | Status: DC

## 2024-02-12 MED ORDER — SEMAGLUTIDE-WEIGHT MANAGEMENT 0.5 MG/0.5ML ~~LOC~~ SOAJ
0.5000 mg | SUBCUTANEOUS | 1 refills | Status: DC
Start: 2024-03-12 — End: 2024-05-17

## 2024-02-12 MED ORDER — SEMAGLUTIDE-WEIGHT MANAGEMENT 0.25 MG/0.5ML ~~LOC~~ SOAJ
0.2500 mg | SUBCUTANEOUS | 0 refills | Status: AC
Start: 2024-02-12 — End: 2024-03-11

## 2024-02-12 NOTE — Patient Instructions (Signed)
Tips for success with Wegovy (and by success, how not to be super sick on your stomach): Eat small meals AVOID heavy foods (fried/ high in carbs like bread, pasta, rice) AVOID carbonated beverages (soda/ beer, as these can increase bloating) DOUBLE your water intake (will help you avoid constipation/ dehydration)  ZOXWRU CAN cause: Nausea Abdominal pain Increased acid reflux (sometimes presents as "sour burps") Constipation OR Diarrhea Fatigue (especially when you first start it)
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
## 2024-02-13 ENCOUNTER — Ambulatory Visit: Payer: Self-pay | Admitting: Emergency Medicine

## 2024-02-13 ENCOUNTER — Encounter: Payer: Self-pay | Admitting: Pulmonary Disease

## 2024-02-13 ENCOUNTER — Ambulatory Visit: Admitting: Pulmonary Disease

## 2024-02-13 VITALS — BP 143/93 | HR 106 | Ht 74.0 in | Wt 256.0 lb

## 2024-02-13 DIAGNOSIS — R0602 Shortness of breath: Secondary | ICD-10-CM | POA: Diagnosis not present

## 2024-02-13 DIAGNOSIS — C3491 Malignant neoplasm of unspecified part of right bronchus or lung: Secondary | ICD-10-CM

## 2024-02-13 DIAGNOSIS — R0609 Other forms of dyspnea: Secondary | ICD-10-CM

## 2024-02-13 DIAGNOSIS — J449 Chronic obstructive pulmonary disease, unspecified: Secondary | ICD-10-CM

## 2024-02-13 DIAGNOSIS — Z87891 Personal history of nicotine dependence: Secondary | ICD-10-CM

## 2024-02-13 MED ORDER — TRELEGY ELLIPTA 100-62.5-25 MCG/ACT IN AEPB: INHALATION_SPRAY | RESPIRATORY_TRACT | Status: DC

## 2024-02-13 MED ORDER — TRELEGY ELLIPTA 200-62.5-25 MCG/ACT IN AEPB: 1.0000 | INHALATION_SPRAY | Freq: Every day | RESPIRATORY_TRACT | 3 refills | Status: DC
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
## 2024-02-13 NOTE — Telephone Encounter (Signed)
 WEGOVY 0.25 MG/0.5ML SOAJ  Pharmacy comment: Alternative Requested:NOT COVERED BY INSURANCE

## 2024-02-13 NOTE — Telephone Encounter (Signed)
 Thank you for seeing him WO

## 2024-02-13 NOTE — Progress Notes (Signed)
 EDDI HYMES    [id number]    [birth year]  Primary Care Physician:Gottschalk, Gwendalyn Lemma, DO  Referring Physician: Eliodoro Guerin, DO [address] [place],  [place] [address]  Chief complaint:   Patient being seen for an acute visit  HPI:  Patient has been seen for worsening shortness of breath, increased use of albuterol   Was recently seen in our office Escalation of this inhaler from Breztri  He feels since starting Breztri  he has been using his albuterol  more than 4 times a day because of increased shortness of breath  He has a cough with clear phlegm No increased sputum production Feels his chest is tighter  Continues to try to stay active  Recently started on GLP-1 agonist  No fevers, no chills Denies generalized musculoskeletal pain or discomfort, does have some sternal discomfort  Reviewed recent office visit with Dr. 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   albuterol  (PROVENTIL ) (2.5 MG/3ML) 0.083% nebulizer solution Take 3 mLs (2.5 mg total) by nebulization every 6 (six) hours as needed for wheezing or shortness of breath.   albuterol  (VENTOLIN  HFA) 108 (90 Base) MCG/ACT inhaler Inhale 2 puffs into the lungs every 6 (six) hours as needed.   Artificial Tear Solution (SOOTHE XP OP) Place 1 drop into both eyes 2 (two) times daily.   b complex vitamins capsule Take 1 capsule by mouth daily.   carvedilol  (COREG ) 25 MG tablet TAKE 1 TABLET (25 MG TOTAL) BY MOUTH TWICE A DAY WITH MEALS   clopidogrel  (PLAVIX ) 75 MG tablet TAKE 1 TABLET BY MOUTH EVERY DAY   [employer] IRON  325 (65 Fe) MG tablet TAKE 1 TABLET BY MOUTH DAILY (Patient taking differently: Take 325 mg by mouth daily  with breakfast.)   Fluticasone -Umeclidin-Vilant (TRELEGY ELLIPTA ) 200-62.5-25 MCG/ACT AEPB Inhale 1 puff into the lungs daily.   levothyroxine  (SYNTHROID ) 137 MCG tablet Take 1 tablet (137 mcg total) by mouth daily. *dose change. Repeat labs in May   methocarbamol (ROBAXIN) 500 MG tablet Take 500 mg by mouth every 6 (six) hours as needed for muscle spasms.   Multiple Vitamin (MULTIVITAMIN) capsule Take 1 capsule by mouth daily.   MYRBETRIQ 50 MG TB24 tablet Take 50 mg by mouth daily.   nitroGLYCERIN  (NITROSTAT ) 0.4 MG SL tablet Place 1 tablet (0.4 mg total) under the tongue every 5 (five) minutes as needed for chest pain.   ondansetron  (ZOFRAN -ODT) 4 MG disintegrating tablet Take 1 tablet (4 mg total) by mouth every 8 (eight) hours as needed for nausea or vomiting.   oxyCODONE  (ROXICODONE ) 5 MG immediate release tablet Take 0.5 tablets (2.5 mg total) by mouth every 6 (six) hours as needed for severe pain (pain score 7-10).   pantoprazole  (PROTONIX ) 40 MG tablet TAKE 1 TABLET BY MOUTH EVERY DAY BEFORE BREAKFAST   Polyethylene Glycol 3350  (MIRALAX PO) Take by mouth daily.   PRALUENT  150 MG/ML SOAJ INJECT 150 MG INTO THE SKIN EVERY 14 (FOURTEEN) DAYS.   Probiotic Product (ALIGN PO) Take 1 tablet by mouth 2 (two) times daily. gummy   Semaglutide-Weight Management 0.25 MG/0.5ML SOAJ Inject 0.25 mg into the skin once a week for 28 days.   [  START ON 03/12/2024] Semaglutide-Weight Management 0.5 MG/0.5ML SOAJ Inject 0.5 mg into the skin once a week.   [START ON 04/10/2024] Semaglutide-Weight Management 1 MG/0.5ML SOAJ Inject 1 mg into the skin once a week.   [START ON 05/09/2024] Semaglutide-Weight Management 1.7 MG/0.75ML SOAJ Inject 1.7 mg into the skin once a week.   [START ON 06/07/2024] Semaglutide-Weight Management 2.4 MG/0.75ML SOAJ Inject 2.4 mg into the skin once a week.   tamsulosin  (FLOMAX ) 0.4 MG CAPS capsule Take 0.4 mg by mouth at bedtime.   umeclidinium bromide  (INCRUSE ELLIPTA ) 62.5 MCG/ACT AEPB  Inhale 1 puff into the lungs daily.   [DISCONTINUED] budeson-glycopyrrolate -formoterol (BREZTRI  AEROSPHERE) 160-9-4.8 MCG/ACT AERO inhaler Inhale 2 puffs into the lungs in the morning and at bedtime.   No facility-administered encounter medications on file as of 02/13/2024.    Allergies as of 02/13/2024 - Review Complete 02/13/2024  Allergen Reaction Noted   Iodinated contrast media Hives and Rash 02/21/2023   Iohexol  Rash 06/10/2021   Pravastatin   04/07/2020    Past Medical History:  Diagnosis Date   Arthritis    Benign localized prostatic hyperplasia with lower urinary tract symptoms (LUTS)    CAD (coronary artery disease) 2013   cardiologist--- dr Lavonne Prairie;   08-26-2012 cath for poss ischemia on NUC-- moderate nonob CAD, aggressive medical manage;  staged cardiac cath 09/ 2022 PCI, DES x1 to mLAD (80%) ;   arthrectomy and DES prox-mid RCA Mild nonobstructive plaque in cath 2013   Chronic constipation    COPD with emphysema [facility])    pulmonology--- dr b. icard/ tammy parrett NP;   no oxygen, no daily inhaler   Diverticulosis of colon    w/ hx diverticulitis   Dyslipidemia    GAD (generalized anxiety disorder)    GERD (gastroesophageal reflux disease)    Hemorrhoids    internal and external   History of adenomatous polyp of colon    followed by dr Willy Harvest   History of antineoplastic chemotherapy    History of basal cell carcinoma (BCC) excision    2019  s/p MOH's nose   History of GI bleed 05/2021   in setting post op cardiac cath PCI / stenting on blood thinnner/ 05-27-2021 Hg 7.9,  EGD done 05-30-2023  showed erosive gastrophy no active bleeding,  received x3 PRBC and IV iron  infusions   History of gout    History of kidney stones    History of palpitations    event monitor--- 11-21-2021  showed infrequent NSVT, ST   History of radiation therapy    pallitive radition to right lung and left axilla 04-30-2018  to 05-21-2018;   enlarging early stage LUL mass SBRT  01-09-2022  to  01-19-2022   Hypertension    Hypothyroidism (acquired)    followed by pcp   IDA (iron  deficiency anemia)    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   urologist--- dr gay/  radiation onologist--- dr Lorri Rota;  dx 05/ 2024, gleason 4+3   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) [facility]) dx'd 04/17/18   to LN, infrahilar mass, lung nodule and lt axilla   Nephrolithiasis    per CT 02-21-2023 in epic bilateral renal calucli nonobstructive   OSA (obstructive sleep apnea)    sleep study 03-22-2021 in epic , mild osa AHI 8.2/hr  cpap intolerant   Port-A-Cath in place    Squamous cell carcinoma of lung, stage IV, 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   oncologist--- dr Marguerita Shih  radiation onologist-- dr Jeryl Moris;  dx  by needle bx;  Non-small cell lung cancer SCC w/ large right infrahilar mass in addition left upper lobe nodule & left axilla mass w/ left axilla lymph node;  treated w/ pallitive radiation , systemic chemo, maintenance immunotherapy discontinued due to intolerence 03/ 2022    Past Surgical History:  Procedure Laterality Date   BELPHAROPTOSIS REPAIR Right    1990s   CARDIAC CATHETERIZATION     CATARACT EXTRACTION W/ INTRAOCULAR LENS  IMPLANT, BILATERAL  2004   COLONOSCOPY N/A 11/03/2014   Procedure: COLONOSCOPY;  Surgeon: Ruby Corporal, MD;  [facility]: [facility];  Service: Endoscopy;  Laterality: N/A;  1225   COLONOSCOPY WITH PROPOFOL   02/13/2023   dr Willy Harvest   CORONARY ATHERECTOMY N/A 05/26/2021   Procedure: CORONARY ATHERECTOMY;  Surgeon: Kyra Phy, MD;  Location: [facility] INVASIVE CV LAB;  Service: Cardiovascular;  Laterality: N/A;   CORONARY STENT INTERVENTION N/A 05/25/2021   Procedure: CORONARY STENT INTERVENTION;  Surgeon: Sammy Crisp, MD;  Location: [facility] INVASIVE CV LAB;  Service: Cardiovascular;  Laterality: N/A;   CORONARY STENT INTERVENTION N/A 05/26/2021   Procedure: CORONARY STENT INTERVENTION;  Surgeon: Kyra Phy, MD;  Location: [facility] INVASIVE CV LAB;   Service: Cardiovascular;  Laterality: N/A;   CORONARY ULTRASOUND/IVUS N/A 05/26/2021   Procedure: Intravascular Ultrasound/IVUS;  Surgeon: Kyra Phy, MD;  Location: [facility] INVASIVE CV LAB;  Service: Cardiovascular;  Laterality: N/A;   ELBOW BURSA SURGERY Left 11/2005   ESOPHAGOGASTRODUODENOSCOPY (EGD) WITH PROPOFOL  N/A 05/29/2021   Procedure: ESOPHAGOGASTRODUODENOSCOPY (EGD) WITH PROPOFOL ;  Surgeon: Kenney Peacemaker, MD;  Location: [facility] ENDOSCOPY;  Service: Endoscopy;  Laterality: N/A;   EXTRACORPOREAL SHOCK WAVE LITHOTRIPSY Right 05/02/2015   @[facility]    GOLD SEED IMPLANT N/A 04/17/2023   Procedure: GOLD SEED IMPLANT;  Surgeon: Lahoma Pigg, MD;  Location: [facility];  Service: Urology;  Laterality: N/A;   IR CV LINE INJECTION  08/24/2020   IR FLUORO GUIDED NEEDLE PLC ASPIRATION/INJECTION LOC  11/28/2020   IR IMAGING GUIDED PORT INSERTION  07/11/2018   KNEE ARTHROSCOPY W/ MENISCAL REPAIR Left 1995   LEFT HEART CATH AND CORONARY ANGIOGRAPHY N/A 05/25/2021   Procedure: LEFT HEART CATH AND CORONARY ANGIOGRAPHY;  Surgeon: Sammy Crisp, MD;  Location: [facility] INVASIVE CV LAB;  Service: Cardiovascular;  Laterality: N/A;   OLECRANON BURSECTOMY Left 06/06/2006   @[facility]  by dr Zachery Hermes. Donavon Fudge;   revision radial left elbow   SPACE OAR INSTILLATION N/A 04/17/2023   Procedure: SPACE OAR INSTILLATION;  Surgeon: Lahoma Pigg, MD;  Location: [facility];  Service: Urology;  Laterality: N/A;   TEMPORARY PACEMAKER N/A 05/26/2021   Procedure: TEMPORARY PACEMAKER;  Surgeon: Kyra Phy, MD;  Location: [facility] INVASIVE CV LAB;  Service: Cardiovascular;  Laterality: N/A;   TONSILLECTOMY     child   TRANSURETHRAL RESECTION OF BLADDER TUMOR  02/17/2010   @[facility]  by dr Joie Narrow   VIDEO BRONCHOSCOPY Bilateral 05/09/2018   Procedure: VIDEO BRONCHOSCOPY WITH FLUORO;  Surgeon: Marine Sia, MD;  Location: [facility] ENDOSCOPY;  Service: Cardiopulmonary;  Laterality: Bilateral;    Family History   Problem Relation Age of Onset   Heart failure Mother    Parkinson's disease Mother    Heart attack Father    Healthy Sister    Skin cancer Sister    Healthy Sister    Healthy Sister    Neuropathy Brother    COPD Brother    Epilepsy Brother    Colon cancer Neg Hx  Pancreatic cancer Neg Hx    Prostate cancer Neg Hx    Rectal cancer Neg Hx    Stomach cancer Neg Hx     Social History   Socioeconomic History   Marital status: Widowed    Spouse name: Not on file   Number of children: 1   Years of education: Not on file   Highest education level: Not on file  Occupational History   Occupation: Emergency planning/management officer  Tobacco Use   Smoking status: Former    Current packs/day: 0.00    Average packs/day: 0.2 packs/day for 2.0 years (0.4 ttl pk-yrs)    Types: Cigarettes    Start date: 10/01/1968    Quit date: 80    Years since quitting: 53.4   Smokeless tobacco: Never  Vaping Use   Vaping status: Never Used  Substance and Sexual Activity   Alcohol use: Not Currently    Comment: occasional   Drug use: Never   Sexual activity: Not on file  Other Topics Concern   Not on file  Social History Narrative   Unable to ask intimate partner violence questions, wife present.    Pt's wife died in August 23, 2021.   Social Drivers of Corporate investment banker Strain: Low Risk  (11/25/2023)   Overall Financial Resource Strain (CARDIA)    Difficulty of Paying Living Expenses: Not hard at all  Food Insecurity: No Food Insecurity (11/25/2023)   Hunger Vital Sign    Worried About Running Out of Food in the Last Year: Never true    Ran Out of Food in the Last Year: Never true  Transportation Needs: No Transportation Needs (11/25/2023)   PRAPARE - Administrator, Civil Service (Medical): No    Lack of Transportation (Non-Medical): No  Physical Activity: Sufficiently Active (11/25/2023)   Exercise Vital Sign    Days of Exercise per Week: 5 days    Minutes of Exercise per  Session: 30 min  Stress: No Stress Concern Present (11/25/2023)   [employer] of Occupational Health - Occupational Stress Questionnaire    Feeling of Stress : Not at all  Social Connections: Moderately Isolated (11/25/2023)   Social Connection and Isolation Panel [NHANES]    Frequency of Communication with Friends and Family: More than three times a week    Frequency of Social Gatherings with Friends and Family: Three times a week    Attends Religious Services: Never    Active Member of Clubs or Organizations: Yes    Attends Banker Meetings: More than 4 times per year    Marital Status: Widowed  Intimate Partner Violence: Not At Risk (11/25/2023)   Humiliation, Afraid, Rape, and Kick questionnaire    Fear of Current or Ex-Partner: No    Emotionally Abused: No    Physically Abused: No    Sexually Abused: No    Review of Systems  Respiratory:  Positive for cough, chest tightness and shortness of breath.     Vitals:   02/13/24 1552  BP: (!) 143/93  Pulse: (!) 106  SpO2: 97%     Physical Exam Constitutional:      Appearance: He is obese.  HENT:     Head: Normocephalic.     Mouth/Throat:     Mouth: Mucous membranes are moist.  Eyes:     General: No scleral icterus. Cardiovascular:     Rate and Rhythm: Normal rate and regular rhythm.     Heart sounds: No murmur heard.  No friction rub.  Pulmonary:     Effort: No respiratory distress.     Breath sounds: No stridor. No wheezing or rhonchi.  Musculoskeletal:     Cervical back: No rigidity or tenderness.  Neurological:     Mental Status: He is alert.  Psychiatric:        Mood and Affect: Mood normal.    Data Reviewed: Most recent CT scan 11/22/2023 was reviewed Previous CT scans compared with the most recent one-noted is the sternal fracture which we did review together, lung mass, bronchiectasis  Most recent pulmonary function test 01/10/2024 reviewed  Last echocardiogram was in 2022 showing low  normal ejection fraction, normal right ventricular systolic function  Assessment:  Patient with chronic obstructive pulm disease, stage IV lung cancer Evidence of bronchiectasis on CT Notes some worsening of his symptoms since starting Breztri   Had been on Trelegy previously -During our discussions he feels open to trying a different inhaler than Breztri  as he felt his need for albuterol  definitely did increase since he started using Breztri  We did have a discussion about the reasoning for placing him on Breztri  as compared to what he was on before He is aware that there is no head-to-head comparison between medications like Breztri  or Trelegy  More shortness of breath  Lung cancer -Right infrahilar mass - Follows up with Dr. Liam Redhead  Weight gain may be contributing to symptomatology - Being started on semaglutide  Old sternal fracture - No intervention needed   Plan/Recommendations: Prescription for Trelegy 200 sent to pharmacy Will provide samples  Encouraged to give us  a call with any significant concerns If increased sputum production or change in color of sputum, feeling on the weather then may need to call in a course of antibiotics  Continue other inhalers  Graded activities as tolerated  Tentative follow-up in about 3 months   Myer Artis MD [facility] 02/13/2024, 4:30 PM  CC: Eliodoro Guerin, DO

## 2024-02-13 NOTE — Patient Instructions (Signed)
 See you in 3 months  I have sent in a prescription for trelegy in place of breztri   Trelegy 200 sent in, we only have samples of the 100  Switch back to breztri  if you feel it worked better  Continue other treatments  Graded activities as tolerated  Call with significant exertion

## 2024-02-14 NOTE — Telephone Encounter (Signed)
 Patient comes in and states his insurance is not covering the [facility]. Told him he probably needs a PA. Please call patient.

## 2024-02-17 ENCOUNTER — Other Ambulatory Visit (HOSPITAL_COMMUNITY): Payer: Self-pay

## 2024-02-17 ENCOUNTER — Telehealth: Payer: Self-pay

## 2024-02-17 NOTE — Telephone Encounter (Signed)
 Pharmacy Patient Advocate Encounter   Received notification from Pt Calls Messages that prior authorization for Wegovy  0.25MG /0.5ML auto-injectors  is required/requested.   Insurance verification completed.   The patient is insured through [facility] .   Per test claim: PA required; PA submitted to above mentioned insurance via CoverMyMeds Key/confirmation #/EOC [id number] Status is pending

## 2024-02-18 ENCOUNTER — Other Ambulatory Visit (HOSPITAL_COMMUNITY): Payer: Self-pay

## 2024-02-18 NOTE — Telephone Encounter (Signed)
 Pt has been notified.

## 2024-02-18 NOTE — Telephone Encounter (Signed)
 Pharmacy Patient Advocate Encounter  Received notification from [facility] that Prior Authorization for Wegovy  0.25MG /0.5ML auto-injectors  has been APPROVED from 02/17/24 to 09/09/2098. Ran test claim, Copay is $358.08. This test claim was processed through [facility]- copay amounts may vary at other pharmacies due to pharmacy/plan contracts, or as the patient moves through the different stages of their insurance plan.   PA #/Case ID/Reference #: [id number]  *patient has a $5,000 deductible which is contributing to his high copay

## 2024-02-20 ENCOUNTER — Other Ambulatory Visit: Payer: Self-pay | Admitting: Family Medicine

## 2024-02-24 ENCOUNTER — Telehealth: Payer: Self-pay

## 2024-02-24 NOTE — Telephone Encounter (Signed)
 Copied from CRM [id number]. Topic: Appointments - Scheduling Inquiry for Clinic >> Feb 12, 2024  2:34 PM Isabell A wrote: Reason for CRM: Patient is requesting an appointment with Dr.Byrum -- states he needs to discuss his med change - still experiencing SOB (not at the moment) & wants to discuss alternatives.   Attempt to schedule - no template loading.  Called and spoke with pt. Last seen with Dr. Gaynell Keeler on 6/5 and was changed from Breztri  to Trelegy. Pt states he has been doing well ever since making the switch and has no other concerns. Nfn

## 2024-02-28 DIAGNOSIS — R0789 Other chest pain: Secondary | ICD-10-CM | POA: Diagnosis not present

## 2024-03-02 DIAGNOSIS — R0789 Other chest pain: Secondary | ICD-10-CM | POA: Diagnosis not present

## 2024-03-13 ENCOUNTER — Other Ambulatory Visit: Payer: Self-pay | Admitting: Family Medicine

## 2024-03-13 DIAGNOSIS — E66811 Obesity, class 1: Secondary | ICD-10-CM

## 2024-03-13 DIAGNOSIS — Z955 Presence of coronary angioplasty implant and graft: Secondary | ICD-10-CM

## 2024-03-13 DIAGNOSIS — I2510 Atherosclerotic heart disease of native coronary artery without angina pectoris: Secondary | ICD-10-CM

## 2024-03-19 ENCOUNTER — Inpatient Hospital Stay: Attending: Adult Health

## 2024-03-19 DIAGNOSIS — Z87891 Personal history of nicotine dependence: Secondary | ICD-10-CM | POA: Insufficient documentation

## 2024-03-19 DIAGNOSIS — C3412 Malignant neoplasm of upper lobe, left bronchus or lung: Secondary | ICD-10-CM | POA: Diagnosis not present

## 2024-03-19 DIAGNOSIS — Z95828 Presence of other vascular implants and grafts: Secondary | ICD-10-CM

## 2024-03-19 DIAGNOSIS — C7801 Secondary malignant neoplasm of right lung: Secondary | ICD-10-CM

## 2024-03-19 MED ORDER — SODIUM CHLORIDE 0.9% FLUSH
10.0000 mL | Freq: Once | INTRAVENOUS | Status: AC
  Administered 2024-03-19: 10 mL

## 2024-03-19 MED ORDER — HEPARIN SOD (PORK) LOCK FLUSH 100 UNIT/ML IV SOLN
500.0000 [IU] | Freq: Once | INTRAVENOUS | Status: AC
  Administered 2024-03-19: 500 [IU]

## 2024-03-24 ENCOUNTER — Encounter: Payer: Self-pay | Admitting: Family Medicine

## 2024-03-30 ENCOUNTER — Encounter: Payer: Self-pay | Admitting: Internal Medicine

## 2024-03-30 DIAGNOSIS — R0789 Other chest pain: Secondary | ICD-10-CM | POA: Diagnosis not present

## 2024-04-08 DIAGNOSIS — M5459 Other low back pain: Secondary | ICD-10-CM | POA: Diagnosis not present

## 2024-04-20 ENCOUNTER — Other Ambulatory Visit: Payer: Self-pay | Admitting: Family Medicine

## 2024-04-20 DIAGNOSIS — E039 Hypothyroidism, unspecified: Secondary | ICD-10-CM

## 2024-04-27 DIAGNOSIS — R0789 Other chest pain: Secondary | ICD-10-CM | POA: Diagnosis not present

## 2024-04-27 DIAGNOSIS — S29011A Strain of muscle and tendon of front wall of thorax, initial encounter: Secondary | ICD-10-CM | POA: Diagnosis not present

## 2024-04-30 ENCOUNTER — Other Ambulatory Visit: Payer: Self-pay | Admitting: Family Medicine

## 2024-05-13 DIAGNOSIS — H43813 Vitreous degeneration, bilateral: Secondary | ICD-10-CM | POA: Diagnosis not present

## 2024-05-14 ENCOUNTER — Ambulatory Visit (HOSPITAL_BASED_OUTPATIENT_CLINIC_OR_DEPARTMENT_OTHER)
Admission: RE | Admit: 2024-05-14 | Discharge: 2024-05-14 | Disposition: A | Discharge status: Home / Self Care | Source: Ambulatory Visit | Attending: Family Medicine | Admitting: Family Medicine

## 2024-05-14 ENCOUNTER — Encounter: Payer: Self-pay | Admitting: Internal Medicine

## 2024-05-14 ENCOUNTER — Ambulatory Visit (INDEPENDENT_AMBULATORY_CARE_PROVIDER_SITE_OTHER): Admitting: Family Medicine

## 2024-05-14 ENCOUNTER — Encounter: Payer: Self-pay | Admitting: Family Medicine

## 2024-05-14 VITALS — BP 113/81 | HR 105 | Temp 97.9°F | Ht 74.0 in | Wt 246.0 lb

## 2024-05-14 DIAGNOSIS — J449 Chronic obstructive pulmonary disease, unspecified: Secondary | ICD-10-CM | POA: Diagnosis not present

## 2024-05-14 DIAGNOSIS — R112 Nausea with vomiting, unspecified: Secondary | ICD-10-CM

## 2024-05-14 DIAGNOSIS — J439 Emphysema, unspecified: Secondary | ICD-10-CM | POA: Diagnosis not present

## 2024-05-14 DIAGNOSIS — C3490 Malignant neoplasm of unspecified part of unspecified bronchus or lung: Secondary | ICD-10-CM | POA: Diagnosis not present

## 2024-05-14 DIAGNOSIS — Z713 Dietary counseling and surveillance: Secondary | ICD-10-CM | POA: Diagnosis not present

## 2024-05-14 DIAGNOSIS — E039 Hypothyroidism, unspecified: Secondary | ICD-10-CM | POA: Diagnosis not present

## 2024-05-14 DIAGNOSIS — K4020 Bilateral inguinal hernia, without obstruction or gangrene, not specified as recurrent: Secondary | ICD-10-CM | POA: Diagnosis not present

## 2024-05-14 DIAGNOSIS — R140 Abdominal distension (gaseous): Secondary | ICD-10-CM | POA: Diagnosis not present

## 2024-05-14 DIAGNOSIS — Z7902 Long term (current) use of antithrombotics/antiplatelets: Secondary | ICD-10-CM | POA: Diagnosis not present

## 2024-05-14 DIAGNOSIS — Z23 Encounter for immunization: Secondary | ICD-10-CM | POA: Diagnosis not present

## 2024-05-14 DIAGNOSIS — Z4682 Encounter for fitting and adjustment of non-vascular catheter: Secondary | ICD-10-CM | POA: Diagnosis not present

## 2024-05-14 DIAGNOSIS — N179 Acute kidney failure, unspecified: Secondary | ICD-10-CM | POA: Diagnosis not present

## 2024-05-14 DIAGNOSIS — C61 Malignant neoplasm of prostate: Secondary | ICD-10-CM | POA: Diagnosis not present

## 2024-05-14 DIAGNOSIS — Z7985 Long-term (current) use of injectable non-insulin antidiabetic drugs: Secondary | ICD-10-CM | POA: Diagnosis not present

## 2024-05-14 DIAGNOSIS — G4733 Obstructive sleep apnea (adult) (pediatric): Secondary | ICD-10-CM | POA: Diagnosis not present

## 2024-05-14 DIAGNOSIS — C3491 Malignant neoplasm of unspecified part of right bronchus or lung: Secondary | ICD-10-CM | POA: Diagnosis not present

## 2024-05-14 DIAGNOSIS — Z8546 Personal history of malignant neoplasm of prostate: Secondary | ICD-10-CM | POA: Diagnosis not present

## 2024-05-14 DIAGNOSIS — I2510 Atherosclerotic heart disease of native coronary artery without angina pectoris: Secondary | ICD-10-CM | POA: Diagnosis not present

## 2024-05-14 DIAGNOSIS — E785 Hyperlipidemia, unspecified: Secondary | ICD-10-CM | POA: Diagnosis not present

## 2024-05-14 DIAGNOSIS — K56609 Unspecified intestinal obstruction, unspecified as to partial versus complete obstruction: Secondary | ICD-10-CM | POA: Insufficient documentation

## 2024-05-14 DIAGNOSIS — N200 Calculus of kidney: Secondary | ICD-10-CM | POA: Diagnosis not present

## 2024-05-14 DIAGNOSIS — E66811 Obesity, class 1: Secondary | ICD-10-CM

## 2024-05-14 DIAGNOSIS — Z79890 Hormone replacement therapy (postmenopausal): Secondary | ICD-10-CM | POA: Diagnosis not present

## 2024-05-14 DIAGNOSIS — K565 Intestinal adhesions [bands], unspecified as to partial versus complete obstruction: Secondary | ICD-10-CM | POA: Diagnosis not present

## 2024-05-14 DIAGNOSIS — E876 Hypokalemia: Secondary | ICD-10-CM | POA: Diagnosis not present

## 2024-05-14 DIAGNOSIS — Z825 Family history of asthma and other chronic lower respiratory diseases: Secondary | ICD-10-CM | POA: Diagnosis not present

## 2024-05-14 DIAGNOSIS — I878 Other specified disorders of veins: Secondary | ICD-10-CM | POA: Diagnosis not present

## 2024-05-14 DIAGNOSIS — K660 Peritoneal adhesions (postprocedural) (postinfection): Secondary | ICD-10-CM | POA: Diagnosis not present

## 2024-05-14 DIAGNOSIS — Z8249 Family history of ischemic heart disease and other diseases of the circulatory system: Secondary | ICD-10-CM | POA: Diagnosis not present

## 2024-05-14 DIAGNOSIS — K5669 Other partial intestinal obstruction: Secondary | ICD-10-CM | POA: Diagnosis not present

## 2024-05-14 DIAGNOSIS — C7802 Secondary malignant neoplasm of left lung: Secondary | ICD-10-CM | POA: Diagnosis not present

## 2024-05-14 DIAGNOSIS — C771 Secondary and unspecified malignant neoplasm of intrathoracic lymph nodes: Secondary | ICD-10-CM | POA: Diagnosis not present

## 2024-05-14 DIAGNOSIS — F411 Generalized anxiety disorder: Secondary | ICD-10-CM | POA: Diagnosis not present

## 2024-05-14 DIAGNOSIS — I10 Essential (primary) hypertension: Secondary | ICD-10-CM | POA: Diagnosis not present

## 2024-05-14 DIAGNOSIS — K5909 Other constipation: Secondary | ICD-10-CM | POA: Diagnosis not present

## 2024-05-14 MED ORDER — ONDANSETRON 4 MG PO TBDP
4.0000 mg | ORAL_TABLET | Freq: Three times a day (TID) | ORAL | 0 refills | Status: AC | PRN
Start: 2024-05-14 — End: ?

## 2024-05-14 NOTE — Progress Notes (Signed)
 BP 113/81   Pulse (!) 105   Temp 97.9 F (36.6 C)   Ht 6' 2 (1.88 m)   Wt 246 lb (111.6 kg)   SpO2 97%   BMI 31.58 kg/m    Subjective:   Patient ID: Travis Padilla, male    DOB: 27-Mar-1946, 78 y.o.   MRN: [id number]  HPI: Travis Padilla is a 78 y.o. male presenting on 05/14/2024 for Constipation (Alternating with diarrhea), Vomiting, and Fatigue   Discussed the use of AI scribe software for clinical note transcription with the patient, who gave verbal consent to proceed.  History of Present Illness   Travis Padilla is a 78 year old male who presents with nausea, vomiting, and constipation after increasing his Wegovy  dose.  He began experiencing nausea and vomiting after increasing his Wegovy  dose to 1 mg two weeks ago. Initially, he tolerated the first week at this dose, but symptoms worsened after the second dose taken on Monday. He describes feeling bloated and constipated, with minimal relief from magnesium -based treatments.  He has a history of bowel obstruction approximately four to five years ago, which resolved with magnesium  treatment. Currently, he is passing very small amounts of stool, described as 'brown sand,' and has not had a significant bowel movement since the middle of last week. He also notes minimal urination and concerns about possible dehydration.  He describes significant fatigue and weakness, which have intensified over the past few days. He lives alone and is retired, having stopped working 13 years ago. No recent illness in his surroundings.  He has attempted various methods to alleviate his symptoms, including yoga poses and lacrosse ball exercises, but continues to feel discomfort, particularly in the abdominal region. He is concerned about his hydration status.          Relevant past medical, surgical, family and social history reviewed and updated as indicated. Interim medical history since our last visit reviewed. Allergies and medications reviewed  and updated.  Review of Systems  Constitutional:  Negative for chills and fever.  Eyes:  Negative for visual disturbance.  Respiratory:  Negative for shortness of breath and wheezing.   Cardiovascular:  Negative for chest pain and leg swelling.  Gastrointestinal:  Positive for abdominal distention, abdominal pain, nausea and vomiting.  Musculoskeletal:  Negative for back pain and gait problem.  Skin:  Negative for rash.  All other systems reviewed and are negative.   Per HPI unless specifically indicated above   Allergies as of 05/14/2024       Reactions   Iodinated Contrast Media Hives, Rash   Pt presented to CT after completed 13 hr premedication regimen and still had an minor rash/hives.    Iohexol  Rash   CT scan contrast caused rash and itching    does fine with premeds.   Pravastatin     Numbness in feet         Medication List        Accurate as of May 14, 2024  2:21 PM. If you have any questions, ask your nurse or doctor.          acetaminophen  500 MG tablet Commonly known as: TYLENOL  Take 1,000 mg by mouth every 6 (six) hours as needed for moderate pain.   albuterol  (2.5 MG/3ML) 0.083% nebulizer solution Commonly known as: PROVENTIL  Take 3 mLs (2.5 mg total) by nebulization every 6 (six) hours as needed for wheezing or shortness of breath.   albuterol  108 (90 Base) MCG/ACT inhaler  Commonly known as: VENTOLIN  HFA INHALE 2 PUFFS INTO THE LUNGS EVERY 6 HOURS AS NEEDED.   ALIGN PO Take 1 tablet by mouth 2 (two) times daily. gummy   b complex vitamins capsule Take 1 capsule by mouth daily.   carvedilol  25 MG tablet Commonly known as: COREG  TAKE 1 TABLET (25 MG TOTAL) BY MOUTH TWICE A DAY WITH MEALS   clopidogrel  75 MG tablet Commonly known as: PLAVIX  TAKE 1 TABLET BY MOUTH EVERY DAY   [facility] Iron  325 (65 Fe) MG tablet Generic drug: ferrous sulfate  TAKE 1 TABLET BY MOUTH DAILY What changed:  how much to take when to take this   Incruse Ellipta   62.5 MCG/ACT Aepb Generic drug: umeclidinium bromide  Inhale 1 puff into the lungs daily.   levothyroxine  137 MCG tablet Commonly known as: SYNTHROID  TAKE 1 TABLET BY MOUTH DAILY. *DOSE CHANGE. REPEAT LABS IN MAY   methocarbamol 500 MG tablet Commonly known as: ROBAXIN Take 500 mg by mouth every 6 (six) hours as needed for muscle spasms.   MIRALAX PO Take by mouth daily.   multivitamin capsule Take 1 capsule by mouth daily.   Myrbetriq 50 MG Tb24 tablet Generic drug: mirabegron ER Take 50 mg by mouth daily.   nitroGLYCERIN  0.4 MG SL tablet Commonly known as: NITROSTAT  Place 1 tablet (0.4 mg total) under the tongue every 5 (five) minutes as needed for chest pain.   ondansetron  4 MG disintegrating tablet Commonly known as: ZOFRAN -ODT Take 1 tablet (4 mg total) by mouth every 8 (eight) hours as needed for nausea or vomiting.   oxyCODONE  5 MG immediate release tablet Commonly known as: Roxicodone  Take 0.5 tablets (2.5 mg total) by mouth every 6 (six) hours as needed for severe pain (pain score 7-10).   pantoprazole  40 MG tablet Commonly known as: PROTONIX  TAKE 1 TABLET BY MOUTH EVERY DAY BEFORE BREAKFAST   Praluent  150 MG/ML Soaj Generic drug: Alirocumab  INJECT 150 MG INTO THE SKIN EVERY 14 (FOURTEEN) DAYS.   semaglutide -weight management 0.5 MG/0.5ML Soaj SQ injection Commonly known as: WEGOVY  Inject 0.5 mg into the skin once a week.   semaglutide -weight management 1 MG/0.5ML Soaj SQ injection Commonly known as: WEGOVY  Inject 1 mg into the skin once a week.   semaglutide -weight management 1.7 MG/0.75ML Soaj SQ injection Commonly known as: WEGOVY  Inject 1.7 mg into the skin once a week.   semaglutide -weight management 2.4 MG/0.75ML Soaj SQ injection Commonly known as: WEGOVY  Inject 2.4 mg into the skin once a week. Start taking on: June 07, 2024   SOOTHE XP OP Place 1 drop into both eyes 2 (two) times daily.   tamsulosin  0.4 MG Caps capsule Commonly  known as: FLOMAX  Take 0.4 mg by mouth at bedtime.   Trelegy Ellipta  200-62.5-25 MCG/ACT Aepb Generic drug: Fluticasone -Umeclidin-Vilant Inhale 1 puff into the lungs daily.   Trelegy Ellipta  100-62.5-25 MCG/ACT Aepb Generic drug: Fluticasone -Umeclidin-Vilant 1 sample         Objective:   BP 113/81   Pulse (!) 105   Temp 97.9 F (36.6 C)   Ht 6' 2 (1.88 m)   Wt 246 lb (111.6 kg)   SpO2 97%   BMI 31.58 kg/m   Wt Readings from Last 3 Encounters:  05/14/24 246 lb (111.6 kg)  02/13/24 256 lb (116.1 kg)  02/12/24 254 lb (115.2 kg)    Physical Exam Physical Exam   ABDOMEN: Tenderness more in the left lower quadrant.         Assessment & Plan:  Problem List Items Addressed This Visit   None Visit Diagnoses       Nausea and vomiting, unspecified vomiting type    -  Primary   Relevant Orders   CMP14+EGFR     Obesity, Class I, BMI 30-34.9       Relevant Medications   ondansetron  (ZOFRAN -ODT) 4 MG disintegrating tablet     Weight loss counseling, encounter for       Relevant Medications   ondansetron  (ZOFRAN -ODT) 4 MG disintegrating tablet     Intestinal obstruction, unspecified cause, unspecified whether partial or complete (HCC)       Relevant Orders   CMP14+EGFR   CT ABDOMEN PELVIS WO CONTRAST          Nausea and vomiting with constipation, possible bowel obstruction, and possible dehydration Nausea and vomiting likely related to increased Wegovy  dosage. Considered bowel obstruction due to symptoms and history. Suspected dehydration due to minimal urination and fatigue. - Review history for previous bowel obstruction details. - Consider treatment for nausea. - Evaluate hydration status with potential blood work and urine test. - Discuss oral rehydration options like Pedialyte. - Consider IV fluids if dehydration is severe.  Fatigue and weakness Fatigue and weakness likely due to dehydration and nutritional status affected by nausea and vomiting.      Will order CT abdomen pelvis and chemistry panel     Follow up plan: Return if symptoms worsen or fail to improve.  Counseling provided for all of the vaccine components Orders Placed This Encounter  Procedures   CT ABDOMEN PELVIS WO CONTRAST   CMP14+EGFR    Fonda Levins, MD [facility] 05/14/2024, 2:21 PM

## 2024-05-15 ENCOUNTER — Ambulatory Visit: Payer: Self-pay

## 2024-05-15 ENCOUNTER — Telehealth: Payer: Self-pay | Admitting: Family Medicine

## 2024-05-15 ENCOUNTER — Ambulatory Visit (HOSPITAL_BASED_OUTPATIENT_CLINIC_OR_DEPARTMENT_OTHER)

## 2024-05-15 ENCOUNTER — Other Ambulatory Visit: Payer: Self-pay

## 2024-05-15 ENCOUNTER — Emergency Department (HOSPITAL_COMMUNITY)

## 2024-05-15 ENCOUNTER — Encounter (HOSPITAL_COMMUNITY): Payer: Self-pay | Admitting: Family Medicine

## 2024-05-15 ENCOUNTER — Inpatient Hospital Stay (HOSPITAL_COMMUNITY)
Admission: EM | Admit: 2024-05-15 | Discharge: 2024-05-21 | DRG: 336 | Disposition: A | Discharge status: Home / Self Care | Attending: Internal Medicine | Admitting: Internal Medicine

## 2024-05-15 DIAGNOSIS — Z888 Allergy status to other drugs, medicaments and biological substances status: Secondary | ICD-10-CM

## 2024-05-15 DIAGNOSIS — K5909 Other constipation: Secondary | ICD-10-CM | POA: Diagnosis present

## 2024-05-15 DIAGNOSIS — K4020 Bilateral inguinal hernia, without obstruction or gangrene, not specified as recurrent: Secondary | ICD-10-CM | POA: Diagnosis present

## 2024-05-15 DIAGNOSIS — Z79890 Hormone replacement therapy (postmenopausal): Secondary | ICD-10-CM

## 2024-05-15 DIAGNOSIS — Z7951 Long term (current) use of inhaled steroids: Secondary | ICD-10-CM

## 2024-05-15 DIAGNOSIS — F411 Generalized anxiety disorder: Secondary | ICD-10-CM | POA: Diagnosis present

## 2024-05-15 DIAGNOSIS — Z85118 Personal history of other malignant neoplasm of bronchus and lung: Secondary | ICD-10-CM

## 2024-05-15 DIAGNOSIS — E66811 Obesity, class 1: Secondary | ICD-10-CM | POA: Diagnosis present

## 2024-05-15 DIAGNOSIS — Z79899 Other long term (current) drug therapy: Secondary | ICD-10-CM

## 2024-05-15 DIAGNOSIS — Z8249 Family history of ischemic heart disease and other diseases of the circulatory system: Secondary | ICD-10-CM

## 2024-05-15 DIAGNOSIS — J449 Chronic obstructive pulmonary disease, unspecified: Secondary | ICD-10-CM | POA: Diagnosis not present

## 2024-05-15 DIAGNOSIS — Z7985 Long-term (current) use of injectable non-insulin antidiabetic drugs: Secondary | ICD-10-CM

## 2024-05-15 DIAGNOSIS — Z6831 Body mass index (BMI) 31.0-31.9, adult: Secondary | ICD-10-CM

## 2024-05-15 DIAGNOSIS — K56609 Unspecified intestinal obstruction, unspecified as to partial versus complete obstruction: Principal | ICD-10-CM | POA: Diagnosis present

## 2024-05-15 DIAGNOSIS — Z923 Personal history of irradiation: Secondary | ICD-10-CM

## 2024-05-15 DIAGNOSIS — E039 Hypothyroidism, unspecified: Secondary | ICD-10-CM | POA: Diagnosis present

## 2024-05-15 DIAGNOSIS — I2510 Atherosclerotic heart disease of native coronary artery without angina pectoris: Secondary | ICD-10-CM | POA: Diagnosis present

## 2024-05-15 DIAGNOSIS — Z8546 Personal history of malignant neoplasm of prostate: Secondary | ICD-10-CM | POA: Diagnosis not present

## 2024-05-15 DIAGNOSIS — Z9221 Personal history of antineoplastic chemotherapy: Secondary | ICD-10-CM

## 2024-05-15 DIAGNOSIS — I10 Essential (primary) hypertension: Secondary | ICD-10-CM | POA: Diagnosis present

## 2024-05-15 DIAGNOSIS — Z961 Presence of intraocular lens: Secondary | ICD-10-CM | POA: Diagnosis present

## 2024-05-15 DIAGNOSIS — Z7902 Long term (current) use of antithrombotics/antiplatelets: Secondary | ICD-10-CM

## 2024-05-15 DIAGNOSIS — N179 Acute kidney failure, unspecified: Secondary | ICD-10-CM | POA: Diagnosis present

## 2024-05-15 DIAGNOSIS — Z23 Encounter for immunization: Secondary | ICD-10-CM | POA: Diagnosis not present

## 2024-05-15 DIAGNOSIS — K565 Intestinal adhesions [bands], unspecified as to partial versus complete obstruction: Principal | ICD-10-CM | POA: Diagnosis present

## 2024-05-15 DIAGNOSIS — G4733 Obstructive sleep apnea (adult) (pediatric): Secondary | ICD-10-CM | POA: Diagnosis present

## 2024-05-15 DIAGNOSIS — C7802 Secondary malignant neoplasm of left lung: Secondary | ICD-10-CM | POA: Diagnosis present

## 2024-05-15 DIAGNOSIS — Z91041 Radiographic dye allergy status: Secondary | ICD-10-CM

## 2024-05-15 DIAGNOSIS — E876 Hypokalemia: Secondary | ICD-10-CM | POA: Diagnosis not present

## 2024-05-15 DIAGNOSIS — Z85828 Personal history of other malignant neoplasm of skin: Secondary | ICD-10-CM

## 2024-05-15 DIAGNOSIS — C771 Secondary and unspecified malignant neoplasm of intrathoracic lymph nodes: Secondary | ICD-10-CM | POA: Diagnosis present

## 2024-05-15 DIAGNOSIS — C3491 Malignant neoplasm of unspecified part of right bronchus or lung: Secondary | ICD-10-CM | POA: Diagnosis present

## 2024-05-15 DIAGNOSIS — Z87891 Personal history of nicotine dependence: Secondary | ICD-10-CM

## 2024-05-15 DIAGNOSIS — Z860101 Personal history of adenomatous and serrated colon polyps: Secondary | ICD-10-CM

## 2024-05-15 DIAGNOSIS — Z825 Family history of asthma and other chronic lower respiratory diseases: Secondary | ICD-10-CM

## 2024-05-15 DIAGNOSIS — J439 Emphysema, unspecified: Secondary | ICD-10-CM | POA: Diagnosis present

## 2024-05-15 DIAGNOSIS — E785 Hyperlipidemia, unspecified: Secondary | ICD-10-CM | POA: Diagnosis present

## 2024-05-15 DIAGNOSIS — Z808 Family history of malignant neoplasm of other organs or systems: Secondary | ICD-10-CM

## 2024-05-15 DIAGNOSIS — Z955 Presence of coronary angioplasty implant and graft: Secondary | ICD-10-CM

## 2024-05-15 DIAGNOSIS — K219 Gastro-esophageal reflux disease without esophagitis: Secondary | ICD-10-CM | POA: Diagnosis present

## 2024-05-15 LAB — CBC
HCT: 43.3 % (ref 39.0–52.0)
Hemoglobin: 15 g/dL (ref 13.0–17.0)
MCH: 33.6 pg (ref 26.0–34.0)
MCHC: 34.6 g/dL (ref 30.0–36.0)
MCV: 97.1 fL (ref 80.0–100.0)
Platelets: 221 K/uL (ref 150–400)
RBC: 4.46 MIL/uL (ref 4.22–5.81)
RDW: 11.8 % (ref 11.5–15.5)
WBC: 12 K/uL — ABNORMAL HIGH (ref 4.0–10.5)
nRBC: 0 % (ref 0.0–0.2)

## 2024-05-15 LAB — COMPREHENSIVE METABOLIC PANEL WITH GFR
ALT: 12 U/L (ref 0–44)
AST: 24 U/L (ref 15–41)
Albumin: 4.5 g/dL (ref 3.5–5.0)
Alkaline Phosphatase: 64 U/L (ref 38–126)
Anion gap: 19 — ABNORMAL HIGH (ref 5–15)
BUN: 59 mg/dL — ABNORMAL HIGH (ref 8–23)
CO2: 27 mmol/L (ref 22–32)
Calcium: 9.8 mg/dL (ref 8.9–10.3)
Chloride: 88 mmol/L — ABNORMAL LOW (ref 98–111)
Creatinine, Ser: 2.84 mg/dL — ABNORMAL HIGH (ref 0.61–1.24)
GFR, Estimated: 22 mL/min — ABNORMAL LOW (ref >60)
Glucose, Bld: 112 mg/dL — ABNORMAL HIGH (ref 70–99)
Potassium: 4.3 mmol/L (ref 3.5–5.1)
Sodium: 134 mmol/L — ABNORMAL LOW (ref 135–145)
Total Bilirubin: 1.6 mg/dL — ABNORMAL HIGH (ref 0.0–1.2)
Total Protein: 7.9 g/dL (ref 6.5–8.1)

## 2024-05-15 LAB — CMP14+EGFR
ALT: 13 IU/L (ref 0–44)
AST: 22 IU/L (ref 0–40)
Albumin: 4.8 g/dL (ref 3.8–4.8)
Alkaline Phosphatase: 72 IU/L (ref 44–121)
BUN/Creatinine Ratio: 17 (ref 10–24)
BUN: 33 mg/dL — ABNORMAL HIGH (ref 8–27)
Bilirubin Total: 1.1 mg/dL (ref 0.0–1.2)
CO2: 26 mmol/L (ref 20–29)
Calcium: 10.6 mg/dL — ABNORMAL HIGH (ref 8.6–10.2)
Chloride: 90 mmol/L — ABNORMAL LOW (ref 96–106)
Creatinine, Ser: 1.99 mg/dL — ABNORMAL HIGH (ref 0.76–1.27)
Globulin, Total: 3.2 g/dL (ref 1.5–4.5)
Glucose: 157 mg/dL — ABNORMAL HIGH (ref 70–99)
Potassium: 4.4 mmol/L (ref 3.5–5.2)
Sodium: 142 mmol/L (ref 134–144)
Total Protein: 8 g/dL (ref 6.0–8.5)
eGFR: 34 mL/min/1.73 — ABNORMAL LOW (ref >59)

## 2024-05-15 LAB — LIPASE, BLOOD: Lipase: 51 U/L (ref 11–51)

## 2024-05-15 MED ORDER — PROCHLORPERAZINE EDISYLATE 10 MG/2ML IJ SOLN
5.0000 mg | INTRAMUSCULAR | Status: DC | PRN
  Administered 2024-05-17: 5 mg via INTRAVENOUS
  Filled 2024-05-15: qty 2

## 2024-05-15 MED ORDER — SODIUM CHLORIDE 0.9 % IV SOLN: INTRAVENOUS | Status: AC

## 2024-05-15 MED ORDER — FENTANYL CITRATE PF 50 MCG/ML IJ SOSY
12.5000 ug | PREFILLED_SYRINGE | INTRAMUSCULAR | Status: DC | PRN
  Administered 2024-05-19: 50 ug via INTRAVENOUS
  Filled 2024-05-15: qty 1

## 2024-05-15 MED ORDER — METOCLOPRAMIDE HCL 5 MG/ML IJ SOLN
5.0000 mg | Freq: Once | INTRAMUSCULAR | Status: AC
  Administered 2024-05-15: 5 mg via INTRAVENOUS
  Filled 2024-05-15: qty 2

## 2024-05-15 MED ORDER — SODIUM CHLORIDE 0.9% FLUSH
3.0000 mL | Freq: Two times a day (BID) | INTRAVENOUS | Status: DC
  Administered 2024-05-16 – 2024-05-21 (×8): 3 mL via INTRAVENOUS

## 2024-05-15 MED ORDER — SODIUM CHLORIDE 0.9 % IV BOLUS
1000.0000 mL | Freq: Once | INTRAVENOUS | Status: AC
  Administered 2024-05-15: 1000 mL via INTRAVENOUS

## 2024-05-15 MED ORDER — PHENOL 1.4 % MT LIQD
1.0000 | OROMUCOSAL | Status: DC | PRN
  Administered 2024-05-19: 1 via OROMUCOSAL
  Filled 2024-05-15 (×2): qty 177

## 2024-05-15 MED ORDER — SODIUM CHLORIDE 0.9 % IV SOLN: INTRAVENOUS | Status: DC

## 2024-05-15 MED ORDER — DIPHENHYDRAMINE HCL 50 MG/ML IJ SOLN
25.0000 mg | Freq: Once | INTRAMUSCULAR | Status: AC
  Administered 2024-05-15: 25 mg via INTRAVENOUS
  Filled 2024-05-15: qty 1

## 2024-05-15 MED ORDER — BUDESON-GLYCOPYRROL-FORMOTEROL 160-9-4.8 MCG/ACT IN AERO
2.0000 | INHALATION_SPRAY | Freq: Two times a day (BID) | RESPIRATORY_TRACT | Status: DC
  Administered 2024-05-15 – 2024-05-21 (×12): 2 via RESPIRATORY_TRACT
  Filled 2024-05-15: qty 5.9

## 2024-05-15 MED ORDER — ACETAMINOPHEN 325 MG PO TABS: 650.0000 mg | ORAL_TABLET | Freq: Four times a day (QID) | ORAL | Status: DC | PRN

## 2024-05-15 MED ORDER — ACETAMINOPHEN 650 MG RE SUPP: 650.0000 mg | Freq: Four times a day (QID) | RECTAL | Status: DC | PRN

## 2024-05-15 NOTE — Telephone Encounter (Addendum)
 FYI Only or Action Required?: Action required by provider: Heading to hospital, still needs feedback from doc about results.  Patient was last seen in primary care on 05/14/2024 by Dettinger, Fonda LABOR, MD.  Called Nurse Triage reporting Results, Weakness, Dizziness, a little disoriented, and Nausea.  Symptoms began worsening today.  Symptoms are: rapidly worsening.  Triage Disposition: Call EMS 911 Now  Patient/caregiver understands and will follow disposition?: Unsure - Heading to hospital regardless       Reason for Disposition  Sounds like a life-threatening emergency to the triager  Answer Assessment - Initial Assessment Questions Per PAS, pt called requesting next steps after seeing CT results indicating bowel obstruction, PAS had pt speak to another triage nurse who directed for pt to be transferred to [facility], PAS attempted to call [facility] multiple times with no answer, PAS attempting to connect pt to NT again. This nurse not able to advise upon results due to no feedback from doc on CT results, but pt mentioned symptoms.  Need to know if need to go to hospital with results Feeling weaker Lightheaded a little disoriented feeling weaker having nausea    Advised pt go to hospital right away, call 911 especially if worsening rapidly. Pt stated intent to go to hospital.  Protocols used: Dizziness - Lightheadedness-A-AH   EDIT: Accepted transfer from PAS, spoke to pt, pt started mentioning symptoms.

## 2024-05-15 NOTE — ED Provider Notes (Signed)
 [place] EMERGENCY DEPARTMENT AT [facility] Provider Note   CSN: [id number] Arrival date & time: 05/15/24  1550     Patient presents with: Emesis, Nausea, and Abdominal Pain   Travis Padilla is a 78 y.o. male.   78 year old male presents with nausea vomiting and constipation.  Patient seen by his physician in the office yesterday.  Abdominal CT ordered and showed evidence of high-grade bowel obstruction.  Patient states that he continues to have nausea with some vomiting.  Describes the emesis as being clear liquid.  Denies any bilious component to it.  Has not had any fever.  Has not passing blood per stool.  States somewhat decreased abdominal distention.  Has been using antiemetics at home.  His doctor told him to come here to be reevaluated.       Prior to Admission medications   Medication Sig Start Date End Date Taking? Authorizing Provider  acetaminophen  (TYLENOL ) 500 MG tablet Take 1,000 mg by mouth every 6 (six) hours as needed for moderate pain.    [provider]  albuterol  (PROVENTIL ) (2.5 MG/3ML) 0.083% nebulizer solution Take 3 mLs (2.5 mg total) by nebulization every 6 (six) hours as needed for wheezing or shortness of breath. 11/19/23 11/18/24  Parrett, Madelin RAMAN, NP  albuterol  (VENTOLIN  HFA) 108 (90 Base) MCG/ACT inhaler INHALE 2 PUFFS INTO THE LUNGS EVERY 6 HOURS AS NEEDED. 04/30/24   Jolinda Norene CHRISTELLA, DO  Artificial Tear Solution (SOOTHE XP OP) Place 1 drop into both eyes 2 (two) times daily.    [provider]  b complex vitamins capsule Take 1 capsule by mouth daily.    [provider]  carvedilol  (COREG ) 25 MG tablet TAKE 1 TABLET (25 MG TOTAL) BY MOUTH TWICE A DAY WITH MEALS 10/16/23   Lavona Agent, MD  clopidogrel  (PLAVIX ) 75 MG tablet TAKE 1 TABLET BY MOUTH EVERY DAY 02/07/24   Lavona Agent, MD  [employer] IRON  325 (65 Fe) MG tablet TAKE 1 TABLET BY MOUTH DAILY Patient taking differently: Take 325 mg by mouth daily with  breakfast. 08/17/21   Henry Manuelita NOVAK, NP  Fluticasone -Umeclidin-Vilant (TRELEGY ELLIPTA ) 100-62.5-25 MCG/ACT AEPB 1 sample 02/13/24   Neda Jennet LABOR, MD  Fluticasone -Umeclidin-Vilant (TRELEGY ELLIPTA ) 200-62.5-25 MCG/ACT AEPB Inhale 1 puff into the lungs daily. 02/13/24   Olalere, Jennet A, MD  levothyroxine  (SYNTHROID ) 137 MCG tablet TAKE 1 TABLET BY MOUTH DAILY. *DOSE CHANGE. REPEAT LABS IN MAY 04/20/24   Jolinda Norene M, DO  methocarbamol (ROBAXIN) 500 MG tablet Take 500 mg by mouth every 6 (six) hours as needed for muscle spasms. 08/11/19   [provider]  Multiple Vitamin (MULTIVITAMIN) capsule Take 1 capsule by mouth daily.    [provider]  MYRBETRIQ 50 MG TB24 tablet Take 50 mg by mouth daily. 02/01/24   [provider]  nitroGLYCERIN  (NITROSTAT ) 0.4 MG SL tablet Place 1 tablet (0.4 mg total) under the tongue every 5 (five) minutes as needed for chest pain. 11/30/21   Lavona Agent, MD  ondansetron  (ZOFRAN -ODT) 4 MG disintegrating tablet Take 1 tablet (4 mg total) by mouth every 8 (eight) hours as needed for nausea or vomiting. 05/14/24   Dettinger, Fonda LABOR, MD  oxyCODONE  (ROXICODONE ) 5 MG immediate release tablet Take 0.5 tablets (2.5 mg total) by mouth every 6 (six) hours as needed for severe pain (pain score 7-10). 08/19/23   Silver Fell A, PA  pantoprazole  (PROTONIX ) 40 MG tablet TAKE 1 TABLET BY MOUTH EVERY DAY BEFORE BREAKFAST  10/16/23   Lavona Agent, MD  Polyethylene Glycol 3350  (MIRALAX PO) Take by mouth daily.    [provider]  PRALUENT  150 MG/ML SOAJ INJECT 150 MG INTO THE SKIN EVERY 14 (FOURTEEN) DAYS. 05/08/23   Lavona Agent, MD  Probiotic Product (ALIGN PO) Take 1 tablet by mouth 2 (two) times daily. gummy    [provider]  Semaglutide -Weight Management 0.5 MG/0.5ML SOAJ Inject 0.5 mg into the skin once a week. 03/12/24   Jolinda Norene HERO, DO  Semaglutide -Weight Management 1 MG/0.5ML SOAJ Inject 1 mg into the skin  once a week. 04/10/24   Jolinda Norene HERO, DO  Semaglutide -Weight Management 1.7 MG/0.75ML SOAJ Inject 1.7 mg into the skin once a week. 05/09/24   Jolinda Norene HERO, DO  Semaglutide -Weight Management 2.4 MG/0.75ML SOAJ Inject 2.4 mg into the skin once a week. 06/07/24   Jolinda Norene HERO, DO  tamsulosin  (FLOMAX ) 0.4 MG CAPS capsule Take 0.4 mg by mouth at bedtime. 09/29/20   [provider]  umeclidinium bromide  (INCRUSE ELLIPTA ) 62.5 MCG/ACT AEPB Inhale 1 puff into the lungs daily. 10/22/23   Parrett, Madelin RAMAN, NP    Allergies: Iodinated contrast media, Iohexol , and Pravastatin     Review of Systems  All other systems reviewed and are negative.   Updated Vital Signs BP 91/66   Pulse 98   Temp 98.2 F (36.8 C) (Oral)   Resp 16   SpO2 98%   Physical Exam Vitals and nursing note reviewed.  Constitutional:      General: He is not in acute distress.    Appearance: Normal appearance. He is well-developed. He is not toxic-appearing.  HENT:     Head: Normocephalic and atraumatic.  Eyes:     General: Lids are normal.     Conjunctiva/sclera: Conjunctivae normal.     Pupils: Pupils are equal, round, and reactive to light.  Neck:     Thyroid : No thyroid  mass.     Trachea: No tracheal deviation.  Cardiovascular:     Rate and Rhythm: Normal rate and regular rhythm.     Heart sounds: Normal heart sounds. No murmur heard.    No gallop.  Pulmonary:     Effort: Pulmonary effort is normal. No respiratory distress.     Breath sounds: Normal breath sounds. No stridor. No decreased breath sounds, wheezing, rhonchi or rales.  Abdominal:     General: There is no distension.     Palpations: Abdomen is soft.     Tenderness: There is generalized abdominal tenderness. There is no guarding or rebound.  Musculoskeletal:        General: No tenderness. Normal range of motion.     Cervical back: Normal range of motion and neck supple.  Skin:    General: Skin is warm and dry.     Findings:  No abrasion or rash.  Neurological:     Mental Status: He is alert and oriented to person, place, and time. Mental status is at baseline.     GCS: GCS eye subscore is 4. GCS verbal subscore is 5. GCS motor subscore is 6.     Cranial Nerves: No cranial nerve deficit.     Sensory: No sensory deficit.     Motor: Motor function is intact.  Psychiatric:        Attention and Perception: Attention normal.        Speech: Speech normal.        Behavior: Behavior normal.     (all labs ordered  are listed, but only abnormal results are displayed) Labs Reviewed  LIPASE, BLOOD  COMPREHENSIVE METABOLIC PANEL WITH GFR  CBC  URINALYSIS, ROUTINE W REFLEX MICROSCOPIC    EKG: None  Radiology: CT ABDOMEN PELVIS WO CONTRAST Result Date: 05/14/2024 CLINICAL DATA:  Bowel obstruction suspected. Nausea and vomiting. History of lung cancer and prostate cancer. EXAM: CT ABDOMEN AND PELVIS WITHOUT CONTRAST TECHNIQUE: Multidetector CT imaging of the abdomen and pelvis was performed following the standard protocol without IV contrast. RADIATION DOSE REDUCTION: This exam was performed according to the departmental dose-optimization program which includes automated exposure control, adjustment of the mA and/or kV according to patient size and/or use of iterative reconstruction technique. COMPARISON:  CT abdomen and pelvis 08/28/2023. FINDINGS: Lower chest: Post treatment changes in the right lung are partially imaged and unchanged. No acute findings. Hepatobiliary: No focal liver abnormality is seen. No gallstones, gallbladder wall thickening, or biliary dilatation. Pancreas: Unremarkable. No pancreatic ductal dilatation or surrounding inflammatory changes. Spleen: Normal in size without focal abnormality. Adrenals/Urinary Tract: There are punctate nonobstructing bilateral renal calculi measuring up to 7 mm. There is no hydronephrosis. There is a cyst in the superior pole of the left kidney measuring 1 cm. The bladder is  completely decompressed and not well evaluated. There some questionable mild inflammation surrounding the bladder. The adrenal glands appear normal. Stomach/Bowel: There are numerous dilated proximal small bowel loops with air-fluid levels measuring up to 5 cm. Transition point is seen in the central abdomen image 2/60. Distal small bowel is completely decompressed. There is no pneumatosis or free air. The stomach is dilated with air-fluid level. Colon is nondilated. The appendix is within normal limits. There is sigmoid colon diverticulosis. Vascular/Lymphatic: Aortic atherosclerosis. No enlarged abdominal or pelvic lymph nodes. Reproductive: Prostate radiotherapy seeds are present. Other: No abdominal wall hernia or abnormality. No abdominopelvic ascites. Musculoskeletal: Degenerative changes affect the spine. No acute osseous abnormality. IMPRESSION: 1. High-grade small bowel obstruction with transition point in the central abdomen. 2. Nonobstructing bilateral renal calculi. 3. Questionable mild inflammation surrounding the bladder. Correlate clinically for cystitis. 4. Aortic atherosclerosis. Aortic Atherosclerosis (ICD10-I70.0). Electronically Signed   By: Greig Pique M.D.   On: 05/14/2024 17:31     Procedures   Medications Ordered in the ED  0.9 %  sodium chloride  infusion (has no administration in time range)  sodium chloride  0.9 % bolus 1,000 mL (has no administration in time range)  metoCLOPramide  (REGLAN ) injection 5 mg (has no administration in time range)                                    Medical Decision Making Amount and/or Complexity of Data Reviewed Labs: ordered. Radiology: ordered.  Risk Prescription drug management.   Patient is abdominal CT showed evidence of high-grade small bowel obstruction.  Discussed with Dr. Curvin from general surgery recommends NG tube and they will see in consultation.  Labs significant for acute kidney injury.  Patient given IV hydration here.   Also given medication for pain here as well.  Consult hospitalist team for admission     Final diagnoses:  None    ED Discharge Orders     None          Dasie Faden, MD 05/15/24 [phone]

## 2024-05-15 NOTE — ED Notes (Signed)
Bladder scan 96ml 

## 2024-05-15 NOTE — H&P (Signed)
 History and Physical    RISHIT BURKHALTER FMW:[id number] DOB: 1946/07/18 DOA: 05/15/2024  PCP: Jolinda Norene CHRISTELLA, DO   Patient coming from: Home   Chief Complaint: Constipation, nausea, abdominal discomfort   HPI: PERSEUS WESTALL is a 78 y.o. male with medical history significant for prostate cancer, stage IV lung cancer, COPD, OSA with CPAP intolerance, CAD, hypothyroidism, and anxiety who presents with worsening nausea, abdominal discomfort, and constipation.    Patient reports that his symptoms began several days ago and have continued to worsen despite use of enemas.  He is not passing flatus.  He denies any recent chest pain or shortness of breath.    He was evaluated in an outpatient clinic for this and had labs and CT performed.  CT was concerning for high-grade SBO.  ED Course: Upon arrival to the ED, patient is found to be afebrile and saturating well on room air with normal HR and stable BP.  Labs are most notable for BUN 59, creatinine 2.84, and WBC 12,000.  General surgery (Dr. Curvin) was consulted by the ED physician and the patient was given a liter of NS and Reglan .  NG tube was placed.  Review of Systems:  All other systems reviewed and apart from HPI, are negative.  Past Medical History:  Diagnosis Date   Arthritis    Benign localized prostatic hyperplasia with lower urinary tract symptoms (LUTS)    CAD (coronary artery disease) 2013   cardiologist--- dr lavona;   08-26-2012 cath for poss ischemia on NUC-- moderate nonob CAD, aggressive medical manage;  staged cardiac cath 09/ 2022 PCI, DES x1 to mLAD (80%) ;   arthrectomy and DES prox-mid RCA Mild nonobstructive plaque in cath 2013   Chronic constipation    COPD with emphysema [facility])    pulmonology--- dr b. icard/ tammy parrett NP;   no oxygen, no daily inhaler   Diverticulosis of colon    w/ hx diverticulitis   Dyslipidemia    GAD (generalized anxiety disorder)    GERD (gastroesophageal reflux disease)     Hemorrhoids    internal and external   History of adenomatous polyp of colon    followed by dr avram   History of antineoplastic chemotherapy    History of basal cell carcinoma (BCC) excision    2019  s/p MOH's nose   History of GI bleed 05/2021   in setting post op cardiac cath PCI / stenting on blood thinnner/ 05-27-2021 Hg 7.9,  EGD done 05-30-2023  showed erosive gastrophy no active bleeding,  received x3 PRBC and IV iron  infusions   History of gout    History of kidney stones    History of palpitations    event monitor--- 11-21-2021  showed infrequent NSVT, ST   History of radiation therapy    pallitive radition to right lung and left axilla 04-30-2018  to 05-21-2018;   enlarging early stage LUL mass SBRT  01-09-2022  to 01-19-2022   Hypertension    Hypothyroidism (acquired)    followed by pcp   IDA (iron  deficiency anemia)    Malignant neoplasm prostate [facility]) 01/2023   urologist--- dr gay/  radiation onologist--- dr patrcia;  dx 05/ 2024, gleason 4+3   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) [facility]) dx'd 04/17/18   to LN, infrahilar mass, lung nodule and lt axilla   Nephrolithiasis    per CT 02-21-2023 in epic bilateral renal calucli nonobstructive   OSA (obstructive sleep apnea)  sleep study 03-22-2021 in epic , mild osa AHI 8.2/hr  cpap intolerant   Port-A-Cath in place    Squamous cell carcinoma of lung, stage IV, 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   oncologist--- dr ozzie  radiation onologist-- dr dewey;  dx by needle bx;  Non-small cell lung cancer SCC w/ large right infrahilar mass in addition left upper lobe nodule & left axilla mass w/ left axilla lymph node;  treated w/ pallitive radiation , systemic chemo, maintenance immunotherapy discontinued due to intolerence 03/ 2022    Past Surgical History:  Procedure Laterality Date   BELPHAROPTOSIS REPAIR Right    1990s   CARDIAC CATHETERIZATION     CATARACT EXTRACTION W/ INTRAOCULAR LENS  IMPLANT, BILATERAL  2004    COLONOSCOPY N/A 11/03/2014   Procedure: COLONOSCOPY;  Surgeon: Claudis RAYMOND Rivet, MD;  Location: [facility];  Service: Endoscopy;  Laterality: N/A;  1225   COLONOSCOPY WITH PROPOFOL   02/13/2023   dr avram   CORONARY ATHERECTOMY N/A 05/26/2021   Procedure: CORONARY ATHERECTOMY;  Surgeon: Wendel Lurena POUR, MD;  Location: [facility] INVASIVE CV LAB;  Service: Cardiovascular;  Laterality: N/A;   CORONARY STENT INTERVENTION N/A 05/25/2021   Procedure: CORONARY STENT INTERVENTION;  Surgeon: Mady Bruckner, MD;  Location: [facility] INVASIVE CV LAB;  Service: Cardiovascular;  Laterality: N/A;   CORONARY STENT INTERVENTION N/A 05/26/2021   Procedure: CORONARY STENT INTERVENTION;  Surgeon: Wendel Lurena POUR, MD;  Location: [facility] INVASIVE CV LAB;  Service: Cardiovascular;  Laterality: N/A;   CORONARY ULTRASOUND/IVUS N/A 05/26/2021   Procedure: Intravascular Ultrasound/IVUS;  Surgeon: Wendel Lurena POUR, MD;  Location: [facility] INVASIVE CV LAB;  Service: Cardiovascular;  Laterality: N/A;   ELBOW BURSA SURGERY Left 11/2005   ESOPHAGOGASTRODUODENOSCOPY (EGD) WITH PROPOFOL  N/A 05/29/2021   Procedure: ESOPHAGOGASTRODUODENOSCOPY (EGD) WITH PROPOFOL ;  Surgeon: avram Lupita BRAVO, MD;  Location: [facility] ENDOSCOPY;  Service: Endoscopy;  Laterality: N/A;   EXTRACORPOREAL SHOCK WAVE LITHOTRIPSY Right 05/02/2015   @[facility]    GOLD SEED IMPLANT N/A 04/17/2023   Procedure: GOLD SEED IMPLANT;  Surgeon: Selma Donnice SAUNDERS, MD;  Location: [facility];  Service: Urology;  Laterality: N/A;   IR CV LINE INJECTION  08/24/2020   IR FLUORO GUIDED NEEDLE PLC ASPIRATION/INJECTION LOC  11/28/2020   IR IMAGING GUIDED PORT INSERTION  07/11/2018   KNEE ARTHROSCOPY W/ MENISCAL REPAIR Left 1995   LEFT HEART CATH AND CORONARY ANGIOGRAPHY N/A 05/25/2021   Procedure: LEFT HEART CATH AND CORONARY ANGIOGRAPHY;  Surgeon: Mady Bruckner, MD;  Location: [facility] INVASIVE CV LAB;  Service: Cardiovascular;  Laterality: N/A;   OLECRANON BURSECTOMY Left 06/06/2006    @[facility]  by dr bravo. deward;   revision radial left elbow   SPACE OAR INSTILLATION N/A 04/17/2023   Procedure: SPACE OAR INSTILLATION;  Surgeon: Selma Donnice SAUNDERS, MD;  Location: [facility];  Service: Urology;  Laterality: N/A;   TEMPORARY PACEMAKER N/A 05/26/2021   Procedure: TEMPORARY PACEMAKER;  Surgeon: Wendel Lurena POUR, MD;  Location: [facility] INVASIVE CV LAB;  Service: Cardiovascular;  Laterality: N/A;   TONSILLECTOMY     child   TRANSURETHRAL RESECTION OF BLADDER TUMOR  02/17/2010   @[facility]  by dr matilda   VIDEO BRONCHOSCOPY Bilateral 05/09/2018   Procedure: VIDEO BRONCHOSCOPY WITH FLUORO;  Surgeon: Alaine Vicenta NOVAK, MD;  Location: [facility] ENDOSCOPY;  Service: Cardiopulmonary;  Laterality: Bilateral;    Social History:   reports that he quit smoking about 53 years ago. His smoking use included cigarettes. He started smoking about 55 years ago. He has a  0.4 pack-year smoking history. He has never used smokeless tobacco. He reports that he does not currently use alcohol. He reports that he does not use drugs.  Allergies  Allergen Reactions   Iodinated Contrast Media Hives and Rash    Pt presented to CT after completed 13 hr premedication regimen and still had an minor rash/hives.    Iohexol  Rash    CT scan contrast caused rash and itching    does fine with premeds.   Pravastatin      Numbness in feet     Family History  Problem Relation Age of Onset   Heart failure Mother    Parkinson's disease Mother    Heart attack Father    Healthy Sister    Skin cancer Sister    Healthy Sister    Healthy Sister    Neuropathy Brother    COPD Brother    Epilepsy Brother    Colon cancer Neg Hx    Pancreatic cancer Neg Hx    Prostate cancer Neg Hx    Rectal cancer Neg Hx    Stomach cancer Neg Hx      Prior to Admission medications   Medication Sig Start Date End Date Taking? Authorizing Provider  acetaminophen  (TYLENOL ) 500 MG tablet Take 1,000 mg by mouth every 6 (six) hours as needed  for moderate pain.    [provider]  albuterol  (PROVENTIL ) (2.5 MG/3ML) 0.083% nebulizer solution Take 3 mLs (2.5 mg total) by nebulization every 6 (six) hours as needed for wheezing or shortness of breath. 11/19/23 11/18/24  Parrett, Madelin RAMAN, NP  albuterol  (VENTOLIN  HFA) 108 (90 Base) MCG/ACT inhaler INHALE 2 PUFFS INTO THE LUNGS EVERY 6 HOURS AS NEEDED. 04/30/24   Jolinda Norene HERO, DO  Artificial Tear Solution (SOOTHE XP OP) Place 1 drop into both eyes 2 (two) times daily.    [provider]  b complex vitamins capsule Take 1 capsule by mouth daily.    [provider]  carvedilol  (COREG ) 25 MG tablet TAKE 1 TABLET (25 MG TOTAL) BY MOUTH TWICE A DAY WITH MEALS 10/16/23   Lavona Agent, MD  clopidogrel  (PLAVIX ) 75 MG tablet TAKE 1 TABLET BY MOUTH EVERY DAY 02/07/24   Lavona Agent, MD  [facility] IRON  325 (65 Fe) MG tablet TAKE 1 TABLET BY MOUTH DAILY Patient taking differently: Take 325 mg by mouth daily with breakfast. 08/17/21   Henry Manuelita NOVAK, NP  Fluticasone -Umeclidin-Vilant (TRELEGY ELLIPTA ) 100-62.5-25 MCG/ACT AEPB 1 sample 02/13/24   Neda Jennet LABOR, MD  Fluticasone -Umeclidin-Vilant (TRELEGY ELLIPTA ) 200-62.5-25 MCG/ACT AEPB Inhale 1 puff into the lungs daily. 02/13/24   Olalere, Jennet A, MD  levothyroxine  (SYNTHROID ) 137 MCG tablet TAKE 1 TABLET BY MOUTH DAILY. *DOSE CHANGE. REPEAT LABS IN MAY 04/20/24   Jolinda Norene M, DO  methocarbamol (ROBAXIN) 500 MG tablet Take 500 mg by mouth every 6 (six) hours as needed for muscle spasms. 08/11/19   [provider]  Multiple Vitamin (MULTIVITAMIN) capsule Take 1 capsule by mouth daily.    [provider]  MYRBETRIQ 50 MG TB24 tablet Take 50 mg by mouth daily. 02/01/24   [provider]  nitroGLYCERIN  (NITROSTAT ) 0.4 MG SL tablet Place 1 tablet (0.4 mg total) under the tongue every 5 (five) minutes as needed for chest pain. 11/30/21   Lavona Agent, MD  ondansetron  (ZOFRAN -ODT) 4 MG  disintegrating tablet Take 1 tablet (4 mg total) by mouth every 8 (eight) hours as needed for nausea or vomiting. 05/14/24   Dettinger,  Fonda LABOR, MD  oxyCODONE  (ROXICODONE ) 5 MG immediate release tablet Take 0.5 tablets (2.5 mg total) by mouth every 6 (six) hours as needed for severe pain (pain score 7-10). 08/19/23   Silver Fell A, PA  pantoprazole  (PROTONIX ) 40 MG tablet TAKE 1 TABLET BY MOUTH EVERY DAY BEFORE BREAKFAST 10/16/23   Lavona Agent, MD  Polyethylene Glycol 3350  (MIRALAX PO) Take by mouth daily.    [provider]  PRALUENT  150 MG/ML SOAJ INJECT 150 MG INTO THE SKIN EVERY 14 (FOURTEEN) DAYS. 05/08/23   Lavona Agent, MD  Probiotic Product (ALIGN PO) Take 1 tablet by mouth 2 (two) times daily. gummy    [provider]  Semaglutide -Weight Management 0.5 MG/0.5ML SOAJ Inject 0.5 mg into the skin once a week. 03/12/24   Jolinda Norene HERO, DO  Semaglutide -Weight Management 1 MG/0.5ML SOAJ Inject 1 mg into the skin once a week. 04/10/24   Jolinda Norene HERO, DO  Semaglutide -Weight Management 1.7 MG/0.75ML SOAJ Inject 1.7 mg into the skin once a week. 05/09/24   Jolinda Norene HERO, DO  Semaglutide -Weight Management 2.4 MG/0.75ML SOAJ Inject 2.4 mg into the skin once a week. 06/07/24   Jolinda Norene HERO, DO  tamsulosin  (FLOMAX ) 0.4 MG CAPS capsule Take 0.4 mg by mouth at bedtime. 09/29/20   [provider]  umeclidinium bromide  (INCRUSE ELLIPTA ) 62.5 MCG/ACT AEPB Inhale 1 puff into the lungs daily. 10/22/23   Parrett, Madelin RAMAN, NP    Physical Exam: Vitals:   05/15/24 1743 05/15/24 2010 05/15/24 2046 05/15/24 2057  BP:   107/69   Pulse:   88   Resp:   16   Temp:  98.2 F (36.8 C) 98 F (36.7 C)   TempSrc:  Oral Oral   SpO2: 100%  94%   Weight:    111.4 kg  Height:    6' 2 (1.88 m)    Constitutional: NAD, no pallor or diaphoresis  Eyes: PERTLA, lids and conjunctivae normal ENMT: Mucous membranes are moist. Posterior pharynx clear of any exudate or  lesions.   Neck: supple, no masses  Respiratory: no wheezing, no crackles. No accessory muscle use.  Cardiovascular: S1 & S2 heard, regular rate and rhythm. No extremity edema.  Abdomen: Soft, mild distension, generally tender. Bowel sounds hypoactive.  Musculoskeletal: no clubbing / cyanosis. No joint deformity upper and lower extremities.   Skin: no significant rashes, lesions, ulcers. Warm, dry, well-perfused. Neurologic: CN 2-12 grossly intact. Moving all extremities. Alert and oriented.  Psychiatric: Calm. Cooperative.    Labs and Imaging on Admission: I have personally reviewed following labs and imaging studies  CBC: Recent Labs  Lab 05/15/24 1810  WBC 12.0*  HGB 15.0  HCT 43.3  MCV 97.1  PLT 221   Basic Metabolic Panel: Recent Labs  Lab 05/14/24 1522 05/15/24 1810  NA 142 134*  K 4.4 4.3  CL 90* 88*  CO2 26 27  GLUCOSE 157* 112*  BUN 33* 59*  CREATININE 1.99* 2.84*  CALCIUM 10.6* 9.8   GFR: Estimated Creatinine Clearance: 29.4 mL/min (A) (by C-G formula based on SCr of 2.84 mg/dL (H)). Liver Function Tests: Recent Labs  Lab 05/14/24 1522 05/15/24 1810  AST 22 24  ALT 13 12  ALKPHOS 72 64  BILITOT 1.1 1.6*  PROT 8.0 7.9  ALBUMIN  4.8 4.5   Recent Labs  Lab 05/15/24 1810  LIPASE 51   No results for input(s): AMMONIA in the last 168 hours. Coagulation Profile: No results for input(s): INR, PROTIME in  the last 168 hours. Cardiac Enzymes: No results for input(s): CKTOTAL, CKMB, CKMBINDEX, TROPONINI in the last 168 hours. BNP (last 3 results) No results for input(s): PROBNP in the last 8760 hours. HbA1C: No results for input(s): HGBA1C in the last 72 hours. CBG: No results for input(s): GLUCAP in the last 168 hours. Lipid Profile: No results for input(s): CHOL, HDL, LDLCALC, TRIG, CHOLHDL, LDLDIRECT in the last 72 hours. Thyroid  Function Tests: No results for input(s): TSH, T4TOTAL, FREET4, T3FREE,  THYROIDAB in the last 72 hours. Anemia Panel: No results for input(s): VITAMINB12, FOLATE, FERRITIN, TIBC, IRON , RETICCTPCT in the last 72 hours. Urine analysis:    Component Value Date/Time   COLORURINE YELLOW 06/11/2021 2253   APPEARANCEUR HAZY (A) 06/11/2021 2253   LABSPEC 1.020 06/11/2021 2253   PHURINE 5.0 06/11/2021 2253   GLUCOSEU NEGATIVE 06/11/2021 2253   HGBUR NEGATIVE 06/11/2021 2253   BILIRUBINUR NEGATIVE 06/11/2021 2253   BILIRUBINUR negative 04/18/2015 0924   KETONESUR 20 (A) 06/11/2021 2253   PROTEINUR NEGATIVE 06/11/2021 2253   UROBILINOGEN negative 04/18/2015 0924   NITRITE NEGATIVE 06/11/2021 2253   LEUKOCYTESUR NEGATIVE 06/11/2021 2253   Sepsis Labs: @LABRCNTIP (procalcitonin:4,lacticidven:4) )No results found for this or any previous visit (from the past 240 hours).   Radiological Exams on Admission: DG Abd Portable 1 View Result Date: 05/15/2024 CLINICAL DATA:  Enteric catheter placement EXAM: PORTABLE ABDOMEN - 1 VIEW COMPARISON:  05/15/2024 FINDINGS: Supine frontal view of the abdomen and pelvis excludes the bilateral flanks, right hemidiaphragm, and pubic symphysis by collimation. Enteric catheter tip and side port project over the gastric fundus. Continue distension the stomach and small bowel. No masses or abnormal calcifications. IMPRESSION: 1. Enteric catheter tip and side port projecting over the gastric fundus. 2. Persistent gaseous distention of the stomach and small bowel consistent with obstruction. Electronically Signed   By: Ozell Daring M.D.   On: 05/15/2024 20:03   DG Abd Portable 1 View Result Date: 05/15/2024 CLINICAL DATA:  NG tube placement EXAM: PORTABLE ABDOMEN - 1 VIEW COMPARISON:  05/02/2015 FINDINGS: NG tube tip is in the right lower lobe airway. Recommend complete removal and replacement. Mild gaseous distention of the stomach. IMPRESSION: NG tube tip in the right lower lobe airway. Recommend complete removal and replacement.  These results were called by telephone at the time of interpretation on 05/15/2024 at 7:39 pm to provider CURTISTINE DAWN , who verbally acknowledged these results. Electronically Signed   By: Franky Crease M.D.   On: 05/15/2024 19:39   CT ABDOMEN PELVIS WO CONTRAST Result Date: 05/14/2024 CLINICAL DATA:  Bowel obstruction suspected. Nausea and vomiting. History of lung cancer and prostate cancer. EXAM: CT ABDOMEN AND PELVIS WITHOUT CONTRAST TECHNIQUE: Multidetector CT imaging of the abdomen and pelvis was performed following the standard protocol without IV contrast. RADIATION DOSE REDUCTION: This exam was performed according to the departmental dose-optimization program which includes automated exposure control, adjustment of the mA and/or kV according to patient size and/or use of iterative reconstruction technique. COMPARISON:  CT abdomen and pelvis 08/28/2023. FINDINGS: Lower chest: Post treatment changes in the right lung are partially imaged and unchanged. No acute findings. Hepatobiliary: No focal liver abnormality is seen. No gallstones, gallbladder wall thickening, or biliary dilatation. Pancreas: Unremarkable. No pancreatic ductal dilatation or surrounding inflammatory changes. Spleen: Normal in size without focal abnormality. Adrenals/Urinary Tract: There are punctate nonobstructing bilateral renal calculi measuring up to 7 mm. There is no hydronephrosis. There is a cyst in the superior pole of the left  kidney measuring 1 cm. The bladder is completely decompressed and not well evaluated. There some questionable mild inflammation surrounding the bladder. The adrenal glands appear normal. Stomach/Bowel: There are numerous dilated proximal small bowel loops with air-fluid levels measuring up to 5 cm. Transition point is seen in the central abdomen image 2/60. Distal small bowel is completely decompressed. There is no pneumatosis or free air. The stomach is dilated with air-fluid level. Colon is nondilated. The  appendix is within normal limits. There is sigmoid colon diverticulosis. Vascular/Lymphatic: Aortic atherosclerosis. No enlarged abdominal or pelvic lymph nodes. Reproductive: Prostate radiotherapy seeds are present. Other: No abdominal wall hernia or abnormality. No abdominopelvic ascites. Musculoskeletal: Degenerative changes affect the spine. No acute osseous abnormality. IMPRESSION: 1. High-grade small bowel obstruction with transition point in the central abdomen. 2. Nonobstructing bilateral renal calculi. 3. Questionable mild inflammation surrounding the bladder. Correlate clinically for cystitis. 4. Aortic atherosclerosis. Aortic Atherosclerosis (ICD10-I70.0). Electronically Signed   By: Greig Pique M.D.   On: 05/14/2024 17:31     Assessment/Plan  1. SBO  - Continue bowel rest, NGT decompression, IVF hydration, monitor electrolytes, follow-up on surgery recommendations   2. AKI  - No hydronephrosis on CT, likely prerenal  - Continue IVF hydration, renally-dose medications, repeat chem panel in am   3. COPD  - Not in exacerbation  - Continue ICS-LAMA-LABA    4. CAD  - No recent angina    5. Lung cancer  - Stage IV, followed by Dr. Sherrod, currently under observation    6. Hypertension  - Treat as-needed only for now   7. Hypothyroidism  - Use IV levothyroxine  if remains NPO for several days     DVT prophylaxis: SCDs  Code Status: full  Level of Care: Level of care: Med-Surg Family Communication: Neighbor/friend updated with patient's permission  Disposition Plan:  Patient is from: Home  Anticipated d/c is to: TBD Anticipated d/c date is: 05/18/24  Patient currently: Pending return of bowel function  Consults called: Surgery  Admission status: Inpatient     Evalene GORMAN Sprinkles, MD [facility]  05/15/2024, 9:09 PM

## 2024-05-15 NOTE — Telephone Encounter (Signed)
 Patient concerned about results of CT scan- NT unable to give results without recommendation- Agent will transfer call to CAL for office follow up.    Copied from CRM #[id number]. Topic: Clinical - Red Word Triage >> May 15, 2024  2:16 PM Teressa P wrote: Red Word that prompted transfer to Nurse Triage: pt called saying he got a ct yesterday.  Dr. Maryanne ordered it.  He is not in the office today.  He is concerned because he thinks there may be a intestinal blockage.

## 2024-05-15 NOTE — Telephone Encounter (Signed)
 Noted

## 2024-05-15 NOTE — Telephone Encounter (Signed)
 Patient at ED

## 2024-05-15 NOTE — Telephone Encounter (Signed)
 Small bowel obstruction- needs to o to ED

## 2024-05-15 NOTE — ED Triage Notes (Signed)
 Patient here from home reporting ongoing n/v, constipation x1 week. Hx of same. Reports CT scan yesterday reporting blockage. States that he was sent home for bowel rest and Pedialyte.

## 2024-05-16 ENCOUNTER — Inpatient Hospital Stay (HOSPITAL_COMMUNITY)

## 2024-05-16 DIAGNOSIS — K56609 Unspecified intestinal obstruction, unspecified as to partial versus complete obstruction: Secondary | ICD-10-CM | POA: Diagnosis not present

## 2024-05-16 LAB — URINALYSIS, ROUTINE W REFLEX MICROSCOPIC
Bilirubin Urine: NEGATIVE
Glucose, UA: NEGATIVE mg/dL
Hgb urine dipstick: NEGATIVE
Ketones, ur: NEGATIVE mg/dL
Leukocytes,Ua: NEGATIVE
Nitrite: NEGATIVE
Protein, ur: NEGATIVE mg/dL
Specific Gravity, Urine: 1.021 (ref 1.005–1.030)
pH: 5 (ref 5.0–8.0)

## 2024-05-16 LAB — BASIC METABOLIC PANEL WITH GFR
Anion gap: 13 (ref 5–15)
BUN: 54 mg/dL — ABNORMAL HIGH (ref 8–23)
CO2: 27 mmol/L (ref 22–32)
Calcium: 8.7 mg/dL — ABNORMAL LOW (ref 8.9–10.3)
Chloride: 93 mmol/L — ABNORMAL LOW (ref 98–111)
Creatinine, Ser: 2.26 mg/dL — ABNORMAL HIGH (ref 0.61–1.24)
GFR, Estimated: 29 mL/min — ABNORMAL LOW (ref >60)
Glucose, Bld: 103 mg/dL — ABNORMAL HIGH (ref 70–99)
Potassium: 4 mmol/L (ref 3.5–5.1)
Sodium: 133 mmol/L — ABNORMAL LOW (ref 135–145)

## 2024-05-16 LAB — CREATININE, URINE, RANDOM: Creatinine, Urine: 166 mg/dL

## 2024-05-16 LAB — HEPATIC FUNCTION PANEL
ALT: 11 U/L (ref 0–44)
AST: 21 U/L (ref 15–41)
Albumin: 3.9 g/dL (ref 3.5–5.0)
Alkaline Phosphatase: 56 U/L (ref 38–126)
Bilirubin, Direct: 0.6 mg/dL — ABNORMAL HIGH (ref 0.0–0.2)
Indirect Bilirubin: 0.7 mg/dL (ref 0.3–0.9)
Total Bilirubin: 1.3 mg/dL — ABNORMAL HIGH (ref 0.0–1.2)
Total Protein: 6.7 g/dL (ref 6.5–8.1)

## 2024-05-16 LAB — CBC
HCT: 38.5 % — ABNORMAL LOW (ref 39.0–52.0)
Hemoglobin: 13.3 g/dL (ref 13.0–17.0)
MCH: 34.3 pg — ABNORMAL HIGH (ref 26.0–34.0)
MCHC: 34.5 g/dL (ref 30.0–36.0)
MCV: 99.2 fL (ref 80.0–100.0)
Platelets: 170 K/uL (ref 150–400)
RBC: 3.88 MIL/uL — ABNORMAL LOW (ref 4.22–5.81)
RDW: 11.9 % (ref 11.5–15.5)
WBC: 9.7 K/uL (ref 4.0–10.5)
nRBC: 0 % (ref 0.0–0.2)

## 2024-05-16 LAB — SODIUM, URINE, RANDOM: Sodium, Ur: <30 mmol/L

## 2024-05-16 MED ORDER — DIATRIZOATE MEGLUMINE & SODIUM 66-10 % PO SOLN: 90.0000 mL | Freq: Once | ORAL | Status: DC

## 2024-05-16 MED ORDER — SODIUM CHLORIDE 0.9 % IV SOLN: INTRAVENOUS | Status: AC

## 2024-05-16 MED ORDER — DIPHENHYDRAMINE HCL 50 MG/ML IJ SOLN
25.0000 mg | Freq: Once | INTRAMUSCULAR | Status: AC
  Administered 2024-05-16: 25 mg via INTRAVENOUS
  Filled 2024-05-16: qty 1

## 2024-05-16 NOTE — Consult Note (Signed)
 Reason for Consult:bowel obstruction Referring Physician: Dr. Vann  Travis Padilla is an 78 y.o. male.  HPI: The patient is a 78 year old white male with known metastatic lung cancer who presents with a couple days of abdominal distention with nausea and vomiting.  He denies any flatus or bowel movement for the last couple days.  He denies any history of previous abdominal surgery.  He came to the emergency department where a CT scan was performed and was consistent with a small bowel obstruction.  He had an NG tube placed for decompression.  He feels better today.  He has some baseline shortness of breath because of his lung cancer  Past Medical History:  Diagnosis Date   Arthritis    Benign localized prostatic hyperplasia with lower urinary tract symptoms (LUTS)    CAD (coronary artery disease) 2013   cardiologist--- dr lavona;   08-26-2012 cath for poss ischemia on NUC-- moderate nonob CAD, aggressive medical manage;  staged cardiac cath 09/ 2022 PCI, DES x1 to mLAD (80%) ;   arthrectomy and DES prox-mid RCA Mild nonobstructive plaque in cath 2013   Chronic constipation    COPD with emphysema [facility])    pulmonology--- dr b. icard/ tammy parrett NP;   no oxygen, no daily inhaler   Diverticulosis of colon    w/ hx diverticulitis   Dyslipidemia    GAD (generalized anxiety disorder)    GERD (gastroesophageal reflux disease)    Hemorrhoids    internal and external   History of adenomatous polyp of colon    followed by dr avram   History of antineoplastic chemotherapy    History of basal cell carcinoma (BCC) excision    2019  s/p MOH's nose   History of GI bleed 05/2021   in setting post op cardiac cath PCI / stenting on blood thinnner/ 05-27-2021 Hg 7.9,  EGD done 05-30-2023  showed erosive gastrophy no active bleeding,  received x3 PRBC and IV iron  infusions   History of gout    History of kidney stones    History of palpitations    event monitor--- 11-21-2021  showed infrequent NSVT,  ST   History of radiation therapy    pallitive radition to right lung and left axilla 04-30-2018  to 05-21-2018;   enlarging early stage LUL mass SBRT  01-09-2022  to 01-19-2022   Hypertension    Hypothyroidism (acquired)    followed by pcp   IDA (iron  deficiency anemia)    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   urologist--- dr gay/  radiation onologist--- dr patrcia;  dx 05/ 2024, gleason 4+3   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) [facility]) dx'd 04/17/18   to LN, infrahilar mass, lung nodule and lt axilla   Nephrolithiasis    per CT 02-21-2023 in epic bilateral renal calucli nonobstructive   OSA (obstructive sleep apnea)    sleep study 03-22-2021 in epic , mild osa AHI 8.2/hr  cpap intolerant   Port-A-Cath in place    Squamous cell carcinoma of lung, stage IV, 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   oncologist--- dr ozzie  radiation onologist-- dr dewey;  dx by needle bx;  Non-small cell lung cancer SCC w/ large right infrahilar mass in addition left upper lobe nodule & left axilla mass w/ left axilla lymph node;  treated w/ pallitive radiation , systemic chemo, maintenance immunotherapy discontinued due to intolerence 03/ 2022    Past Surgical History:  Procedure Laterality Date   BELPHAROPTOSIS REPAIR Right  1990s   CARDIAC CATHETERIZATION     CATARACT EXTRACTION W/ INTRAOCULAR LENS  IMPLANT, BILATERAL  2004   COLONOSCOPY N/A 11/03/2014   Procedure: COLONOSCOPY;  Surgeon: Claudis RAYMOND Rivet, MD;  [facility];  Service: Endoscopy;  Laterality: N/A;  1225   COLONOSCOPY WITH PROPOFOL   02/13/2023   dr avram   CORONARY ATHERECTOMY N/A 05/26/2021   Procedure: CORONARY ATHERECTOMY;  Surgeon: Wendel Lurena POUR, MD;  Location: [facility] INVASIVE CV LAB;  Service: Cardiovascular;  Laterality: N/A;   CORONARY STENT INTERVENTION N/A 05/25/2021   Procedure: CORONARY STENT INTERVENTION;  Surgeon: Mady Bruckner, MD;  Location: [facility] INVASIVE CV LAB;  Service: Cardiovascular;   Laterality: N/A;   CORONARY STENT INTERVENTION N/A 05/26/2021   Procedure: CORONARY STENT INTERVENTION;  Surgeon: Wendel Lurena POUR, MD;  Location: [facility] INVASIVE CV LAB;  Service: Cardiovascular;  Laterality: N/A;   CORONARY ULTRASOUND/IVUS N/A 05/26/2021   Procedure: Intravascular Ultrasound/IVUS;  Surgeon: Wendel Lurena POUR, MD;  Location: [facility] INVASIVE CV LAB;  Service: Cardiovascular;  Laterality: N/A;   ELBOW BURSA SURGERY Left 11/2005   ESOPHAGOGASTRODUODENOSCOPY (EGD) WITH PROPOFOL  N/A 05/29/2021   Procedure: ESOPHAGOGASTRODUODENOSCOPY (EGD) WITH PROPOFOL ;  Surgeon: avram Lupita BRAVO, MD;  Location: [facility] ENDOSCOPY;  Service: Endoscopy;  Laterality: N/A;   EXTRACORPOREAL SHOCK WAVE LITHOTRIPSY Right 05/02/2015   @[facility]    GOLD SEED IMPLANT N/A 04/17/2023   Procedure: GOLD SEED IMPLANT;  Surgeon: Selma Donnice SAUNDERS, MD;  Location: [facility];  Service: Urology;  Laterality: N/A;   IR CV LINE INJECTION  08/24/2020   IR FLUORO GUIDED NEEDLE PLC ASPIRATION/INJECTION LOC  11/28/2020   IR IMAGING GUIDED PORT INSERTION  07/11/2018   KNEE ARTHROSCOPY W/ MENISCAL REPAIR Left 1995   LEFT HEART CATH AND CORONARY ANGIOGRAPHY N/A 05/25/2021   Procedure: LEFT HEART CATH AND CORONARY ANGIOGRAPHY;  Surgeon: Mady Bruckner, MD;  [facility]: [facility] CV LAB;  Service: Cardiovascular;  Laterality: N/A;   OLECRANON BURSECTOMY Left 06/06/2006   @[facility]  by dr bravo. deward;   revision radial left elbow   SPACE OAR INSTILLATION N/A 04/17/2023   Procedure: SPACE OAR INSTILLATION;  Surgeon: Selma Donnice SAUNDERS, MD;  Location: [facility];  Service: Urology;  Laterality: N/A;   TEMPORARY PACEMAKER N/A 05/26/2021   Procedure: TEMPORARY PACEMAKER;  Surgeon: Wendel Lurena POUR, MD;  Location: [facility] CV LAB;  Service: Cardiovascular;  Laterality: N/A;   TONSILLECTOMY     child   TRANSURETHRAL RESECTION OF BLADDER TUMOR  02/17/2010   @[facility]  by dr matilda   VIDEO BRONCHOSCOPY Bilateral 05/09/2018   Procedure:  VIDEO BRONCHOSCOPY WITH FLUORO;  Surgeon: Alaine Vicenta NOVAK, MD;  Location: [facility] ENDOSCOPY;  Service: Cardiopulmonary;  Laterality: Bilateral;    Family History  Problem Relation Age of Onset   Heart failure Mother    Parkinson's disease Mother    Heart attack Father    Healthy Sister    Skin cancer Sister    Healthy Sister    Healthy Sister    Neuropathy Brother    COPD Brother    Epilepsy Brother    Colon cancer Neg Hx    Pancreatic cancer Neg Hx    Prostate cancer Neg Hx    Rectal cancer Neg Hx    Stomach cancer Neg Hx     Social History:  reports that he quit smoking about 53 years ago. His smoking use included cigarettes. He started smoking about 55 years ago. He has a 0.4 pack-year smoking history. He has never  used smokeless tobacco. He reports that he does not currently use alcohol. He reports that he does not use drugs.  Allergies:  Allergies  Allergen Reactions   Iodinated Contrast Media Hives and Rash    Pt presented to CT after completed 13 hr premedication regimen and still had an minor rash/hives.    Iohexol  Rash    CT scan contrast caused rash and itching    does fine with premeds.   Pravastatin      Numbness in feet     Medications: I have reviewed the patient's current medications.  Results for orders placed or performed during the hospital encounter of 05/15/24 (from the past 48 hours)  Lipase, blood     Status: None   Collection Time: 05/15/24  6:10 PM  Result Value Ref Range   Lipase 51 11 - 51 U/L    Comment: Performed at [facility], [address], [place], [place] [address]  Comprehensive metabolic panel     Status: Abnormal   Collection Time: 05/15/24  6:10 PM  Result Value Ref Range   Sodium 134 (L) 135 - 145 mmol/L   Potassium 4.3 3.5 - 5.1 mmol/L   Chloride 88 (L) 98 - 111 mmol/L   CO2 27 22 - 32 mmol/L   Glucose, Bld 112 (H) 70 - 99 mg/dL    Comment: Glucose reference range applies only to samples taken after fasting  for at least 8 hours.   BUN 59 (H) 8 - 23 mg/dL   Creatinine, Ser 7.15 (H) 0.61 - 1.24 mg/dL   Calcium 9.8 8.9 - 89.6 mg/dL   Total Protein 7.9 6.5 - 8.1 g/dL   Albumin  4.5 3.5 - 5.0 g/dL   AST 24 15 - 41 U/L   ALT 12 0 - 44 U/L   Alkaline Phosphatase 64 38 - 126 U/L   Total Bilirubin 1.6 (H) 0.0 - 1.2 mg/dL   GFR, Estimated 22 (L) >60 mL/min    Comment: (NOTE) Calculated using the CKD-EPI Creatinine Equation (2021)    Anion gap 19 (H) 5 - 15    Comment: Performed at [facility], [address] [place], [place] [address]  CBC     Status: Abnormal   Collection Time: 05/15/24  6:10 PM  Result Value Ref Range   WBC 12.0 (H) 4.0 - 10.5 K/uL   RBC 4.46 4.22 - 5.81 MIL/uL   Hemoglobin 15.0 13.0 - 17.0 g/dL   HCT 56.6 60.9 - 47.9 %   MCV 97.1 80.0 - 100.0 fL   MCH 33.6 26.0 - 34.0 pg   MCHC 34.6 30.0 - 36.0 g/dL   RDW 88.1 88.4 - 84.4 %   Platelets 221 150 - 400 K/uL   nRBC 0.0 0.0 - 0.2 %    Comment: Performed at [facility], [address], [place], [place] [address]  Urinalysis, Routine w reflex microscopic -Urine, Clean Catch     Status: Abnormal   Collection Time: 05/16/24  4:57 AM  Result Value Ref Range   Color, Urine AMBER (A) YELLOW    Comment: BIOCHEMICALS MAY BE AFFECTED BY COLOR   APPearance HAZY (A) CLEAR   Specific Gravity, Urine 1.021 1.005 - 1.030   pH 5.0 5.0 - 8.0   Glucose, UA NEGATIVE NEGATIVE mg/dL   Hgb urine dipstick NEGATIVE NEGATIVE   Bilirubin Urine NEGATIVE NEGATIVE   Ketones, ur NEGATIVE NEGATIVE mg/dL   Protein, ur NEGATIVE NEGATIVE mg/dL   Nitrite NEGATIVE  NEGATIVE   Leukocytes,Ua NEGATIVE NEGATIVE   RBC / HPF 0-5 0 - 5 RBC/hpf   WBC, UA 0-5 0 - 5 WBC/hpf   Bacteria, UA RARE (A) NONE SEEN   Squamous Epithelial / HPF 0-5 0 - 5 /HPF   Hyaline Casts, UA PRESENT     Comment: Performed at [facility], [address] [place], [place] [address]  Sodium, urine, random     Status: None    Collection Time: 05/16/24  4:57 AM  Result Value Ref Range   Sodium, Ur <30 mmol/L    Comment: Performed at [facility], [address] [place], [place] [address]  Creatinine, urine, random     Status: None   Collection Time: 05/16/24  4:57 AM  Result Value Ref Range   Creatinine, Urine 166 mg/dL    Comment: Performed at [facility], [address], [place], [place] [address]  Basic metabolic panel     Status: Abnormal   Collection Time: 05/16/24  5:09 AM  Result Value Ref Range   Sodium 133 (L) 135 - 145 mmol/L   Potassium 4.0 3.5 - 5.1 mmol/L   Chloride 93 (L) 98 - 111 mmol/L   CO2 27 22 - 32 mmol/L   Glucose, Bld 103 (H) 70 - 99 mg/dL    Comment: Glucose reference range applies only to samples taken after fasting for at least 8 hours.   BUN 54 (H) 8 - 23 mg/dL   Creatinine, Ser 7.73 (H) 0.61 - 1.24 mg/dL   Calcium 8.7 (L) 8.9 - 10.3 mg/dL   GFR, Estimated 29 (L) >60 mL/min    Comment: (NOTE) Calculated using the CKD-EPI Creatinine Equation (2021)    Anion gap 13 5 - 15    Comment: Performed at [facility], [address] [place], [place] [address]  Hepatic function panel     Status: Abnormal   Collection Time: 05/16/24  5:09 AM  Result Value Ref Range   Total Protein 6.7 6.5 - 8.1 g/dL   Albumin  3.9 3.5 - 5.0 g/dL   AST 21 15 - 41 U/L   ALT 11 0 - 44 U/L   Alkaline Phosphatase 56 38 - 126 U/L   Total Bilirubin 1.3 (H) 0.0 - 1.2 mg/dL   Bilirubin, Direct 0.6 (H) 0.0 - 0.2 mg/dL   Indirect Bilirubin 0.7 0.3 - 0.9 mg/dL    Comment: Performed at [facility], [address], [place], [place] [address]  CBC     Status: Abnormal   Collection Time: 05/16/24  5:09 AM  Result Value Ref Range   WBC 9.7 4.0 - 10.5 K/uL   RBC 3.88 (L) 4.22 - 5.81 MIL/uL   Hemoglobin 13.3 13.0 - 17.0 g/dL   HCT 61.4 (L) 60.9 - 47.9 %   MCV 99.2 80.0 - 100.0 fL   MCH 34.3 (H) 26.0 - 34.0 pg   MCHC 34.5 30.0 - 36.0  g/dL   RDW 88.0 88.4 - 84.4 %   Platelets 170 150 - 400 K/uL   nRBC 0.0 0.0 - 0.2 %    Comment: Performed at [facility], [address], [place], [place] [address]   *Note: Due to a large number of results and/or encounters for the requested time period, some results have not been displayed. A complete set of results can be found in Results Review.    DG Abd Portable 1 View Result Date: 05/15/2024 CLINICAL  DATA:  Enteric catheter placement EXAM: PORTABLE ABDOMEN - 1 VIEW COMPARISON:  05/15/2024 FINDINGS: Supine frontal view of the abdomen and pelvis excludes the bilateral flanks, right hemidiaphragm, and pubic symphysis by collimation. Enteric catheter tip and side port project over the gastric fundus. Continue distension the stomach and small bowel. No masses or abnormal calcifications. IMPRESSION: 1. Enteric catheter tip and side port projecting over the gastric fundus. 2. Persistent gaseous distention of the stomach and small bowel consistent with obstruction. Electronically Signed   By: Ozell Daring M.D.   On: 05/15/2024 20:03   DG Abd Portable 1 View Result Date: 05/15/2024 CLINICAL DATA:  NG tube placement EXAM: PORTABLE ABDOMEN - 1 VIEW COMPARISON:  05/02/2015 FINDINGS: NG tube tip is in the right lower lobe airway. Recommend complete removal and replacement. Mild gaseous distention of the stomach. IMPRESSION: NG tube tip in the right lower lobe airway. Recommend complete removal and replacement. These results were called by telephone at the time of interpretation on 05/15/2024 at 7:39 pm to provider CURTISTINE DAWN , who verbally acknowledged these results. Electronically Signed   By: Franky Crease M.D.   On: 05/15/2024 19:39   CT ABDOMEN PELVIS WO CONTRAST Result Date: 05/14/2024 CLINICAL DATA:  Bowel obstruction suspected. Nausea and vomiting. History of lung cancer and prostate cancer. EXAM: CT ABDOMEN AND PELVIS WITHOUT CONTRAST TECHNIQUE: Multidetector CT imaging of the  abdomen and pelvis was performed following the standard protocol without IV contrast. RADIATION DOSE REDUCTION: This exam was performed according to the departmental dose-optimization program which includes automated exposure control, adjustment of the mA and/or kV according to patient size and/or use of iterative reconstruction technique. COMPARISON:  CT abdomen and pelvis 08/28/2023. FINDINGS: Lower chest: Post treatment changes in the right lung are partially imaged and unchanged. No acute findings. Hepatobiliary: No focal liver abnormality is seen. No gallstones, gallbladder wall thickening, or biliary dilatation. Pancreas: Unremarkable. No pancreatic ductal dilatation or surrounding inflammatory changes. Spleen: Normal in size without focal abnormality. Adrenals/Urinary Tract: There are punctate nonobstructing bilateral renal calculi measuring up to 7 mm. There is no hydronephrosis. There is a cyst in the superior pole of the left kidney measuring 1 cm. The bladder is completely decompressed and not well evaluated. There some questionable mild inflammation surrounding the bladder. The adrenal glands appear normal. Stomach/Bowel: There are numerous dilated proximal small bowel loops with air-fluid levels measuring up to 5 cm. Transition point is seen in the central abdomen image 2/60. Distal small bowel is completely decompressed. There is no pneumatosis or free air. The stomach is dilated with air-fluid level. Colon is nondilated. The appendix is within normal limits. There is sigmoid colon diverticulosis. Vascular/Lymphatic: Aortic atherosclerosis. No enlarged abdominal or pelvic lymph nodes. Reproductive: Prostate radiotherapy seeds are present. Other: No abdominal wall hernia or abnormality. No abdominopelvic ascites. Musculoskeletal: Degenerative changes affect the spine. No acute osseous abnormality. IMPRESSION: 1. High-grade small bowel obstruction with transition point in the central abdomen. 2.  Nonobstructing bilateral renal calculi. 3. Questionable mild inflammation surrounding the bladder. Correlate clinically for cystitis. 4. Aortic atherosclerosis. Aortic Atherosclerosis (ICD10-I70.0). Electronically Signed   By: Greig Pique M.D.   On: 05/14/2024 17:31    Review of Systems  Constitutional: Negative.   HENT: Negative.    Eyes: Negative.   Respiratory:  Positive for shortness of breath.   Cardiovascular: Negative.   Gastrointestinal:  Positive for abdominal distention, abdominal pain, nausea and vomiting.  Endocrine: Negative.   Genitourinary: Negative.   Musculoskeletal: Negative.  Skin: Negative.   Allergic/Immunologic: Negative.   Neurological: Negative.   Hematological: Negative.   Psychiatric/Behavioral: Negative.     Blood pressure 122/73, pulse 85, temperature 98.4 F (36.9 C), temperature source Oral, resp. rate 17, height 6' 2 (1.88 m), weight 112.9 kg, SpO2 91%. Physical Exam Vitals reviewed.  Constitutional:      General: He is not in acute distress.    Appearance: Normal appearance.  HENT:     Head: Normocephalic and atraumatic.     Right Ear: External ear normal.     Left Ear: External ear normal.     Nose: Nose normal.     Mouth/Throat:     Mouth: Mucous membranes are dry.     Pharynx: Oropharynx is clear.  Eyes:     Extraocular Movements: Extraocular movements intact.     Conjunctiva/sclera: Conjunctivae normal.     Pupils: Pupils are equal, round, and reactive to light.  Cardiovascular:     Rate and Rhythm: Normal rate and regular rhythm.     Pulses: Normal pulses.     Heart sounds: Normal heart sounds.  Pulmonary:     Effort: Pulmonary effort is normal. No respiratory distress.     Breath sounds: Normal breath sounds.  Abdominal:     Palpations: Abdomen is soft.     Tenderness: There is no abdominal tenderness.     Comments: The abdomen is soft and nontender.  There was minimal distention.  There was no guarding.  Musculoskeletal:         General: No swelling or deformity. Normal range of motion.     Cervical back: Normal range of motion and neck supple.  Skin:    General: Skin is warm and dry.     Coloration: Skin is not jaundiced.  Neurological:     General: No focal deficit present.     Mental Status: He is alert and oriented to person, place, and time.  Psychiatric:        Mood and Affect: Mood normal.        Behavior: Behavior normal.     Assessment/Plan: The patient appears to have a small bowel obstruction the source of which is unknown given his lack of previous abdominal surgery.  At this point I would agree with NG tube decompression and close monitoring with serial x-rays.  We will not start the small bowel protocol given his contrast allergy.  He already seems to be clinically improving.  We will follow him closely with you.  Deward Null III 05/16/2024, 7:31 AM

## 2024-05-16 NOTE — Progress Notes (Signed)
 PROGRESS NOTE    Travis Padilla  FMW:[id number] DOB: April 25, 1946 DOA: 05/15/2024 PCP: Jolinda Norene CHRISTELLA, DO    Brief Narrative:  Travis Padilla is a 78 y.o. male with medical history significant for prostate cancer, stage IV lung cancer, COPD, OSA with CPAP intolerance, CAD, hypothyroidism, and anxiety who presents with worsening nausea, abdominal discomfort, and constipation.     Patient reports that his symptoms began several days ago and have continued to worsen despite use of enemas.  He is not passing flatus.  He denies any recent chest pain or shortness of breath.     He was evaluated in an outpatient clinic for this and had labs and CT performed.  CT was concerning for high-grade SBO.   Assessment and Plan:  SBO  - Continue bowel rest, NGT decompression, IVF hydration, monitor electrolytes - Appreciate general surgery consultation - Ambulate   AKI  - No hydronephrosis on CT, likely prerenal  - Continue IVF hydration,-Daily labs   COPD  - Not in exacerbation  - Continue ICS-LAMA-LABA      CAD  - No recent angina      Lung cancer  - Stage IV, followed by Dr. Sherrod, currently under observation      Hypertension  - Treat as-needed only for now    Hypothyroidism  - Use IV levothyroxine  if remains NPO for several days    Obesity Estimated body mass index is 31.96 kg/m as calculated from the following:   Height as of this encounter: 6' 2 (1.88 m).   Weight as of this encounter: 112.9 kg.    DVT prophylaxis: SCDs Start: 05/15/24 2108    Code Status: Full Code Family Communication:   Disposition Plan:  Level of care: Med-Surg Status is: Inpatient     Consultants:  General surgery  Subjective: No nausea  Objective: Vitals:   05/15/24 2057 05/16/24 0131 05/16/24 0500 05/16/24 0510  BP:  123/71  122/73  Pulse:  85    Resp:  17  17  Temp:  98.2 F (36.8 C)  98.4 F (36.9 C)  TempSrc:  Oral  Oral  SpO2:  93%  91%  Weight: 111.4 kg  112.9 kg    Height: 6' 2 (1.88 m)       Intake/Output Summary (Last 24 hours) at 05/16/2024 1227 Last data filed at 05/16/2024 1000 Gross per 24 hour  Intake 1028.4 ml  Output 2070 ml  Net -1041.6 ml   Filed Weights   05/15/24 2057 05/16/24 0500  Weight: 111.4 kg 112.9 kg    Examination:   General: Appearance:    Obese male in no acute distress     Lungs:     Clear to auscultation bilaterally  Heart:    Normal heart rate. Normal rhythm. No murmurs, rubs, or gallops.    MS:   All extremities are intact.    Neurologic:   Awake, alert, oriented x 3. No apparent focal neurological           defect.        Data Reviewed: I have personally reviewed following labs and imaging studies  CBC: Recent Labs  Lab 05/15/24 1810 05/16/24 0509  WBC 12.0* 9.7  HGB 15.0 13.3  HCT 43.3 38.5*  MCV 97.1 99.2  PLT 221 170   Basic Metabolic Panel: Recent Labs  Lab 05/14/24 1522 05/15/24 1810 05/16/24 0509  NA 142 134* 133*  K 4.4 4.3 4.0  CL 90* 88* 93*  CO2 26  27 27  GLUCOSE 157* 112* 103*  BUN 33* 59* 54*  CREATININE 1.99* 2.84* 2.26*  CALCIUM 10.6* 9.8 8.7*   GFR: Estimated Creatinine Clearance: 37.2 mL/min (A) (by C-G formula based on SCr of 2.26 mg/dL (H)). Liver Function Tests: Recent Labs  Lab 05/14/24 1522 05/15/24 1810 05/16/24 0509  AST 22 24 21   ALT 13 12 11   ALKPHOS 72 64 56  BILITOT 1.1 1.6* 1.3*  PROT 8.0 7.9 6.7  ALBUMIN  4.8 4.5 3.9   Recent Labs  Lab 05/15/24 1810  LIPASE 51   No results for input(s): AMMONIA in the last 168 hours. Coagulation Profile: No results for input(s): INR, PROTIME in the last 168 hours. Cardiac Enzymes: No results for input(s): CKTOTAL, CKMB, CKMBINDEX, TROPONINI in the last 168 hours. BNP (last 3 results) No results for input(s): PROBNP in the last 8760 hours. HbA1C: No results for input(s): HGBA1C in the last 72 hours. CBG: No results for input(s): GLUCAP in the last 168 hours. Lipid Profile: No  results for input(s): CHOL, HDL, LDLCALC, TRIG, CHOLHDL, LDLDIRECT in the last 72 hours. Thyroid  Function Tests: No results for input(s): TSH, T4TOTAL, FREET4, T3FREE, THYROIDAB in the last 72 hours. Anemia Panel: No results for input(s): VITAMINB12, FOLATE, FERRITIN, TIBC, IRON , RETICCTPCT in the last 72 hours. Sepsis Labs: No results for input(s): PROCALCITON, LATICACIDVEN in the last 168 hours.  No results found for this or any previous visit (from the past 240 hours).       Radiology Studies: DG Abd 2 Views Result Date: 05/16/2024 CLINICAL DATA:  Small-bowel obstruction EXAM: ABDOMEN - 2 VIEW COMPARISON:  Abdominal radiograph dated 05/15/2024 FINDINGS: Gastric/enteric tube tip projects over the stomach. Partially imaged right chest wall port tip projects over the superior cavoatrial junction. Partially imaged prostate fiducials. Decreased number of gas-filled dilated bowel loops in the left hemiabdomen. IMPRESSION: Decreased number of gas-filled dilated bowel loops in the left hemiabdomen. Electronically Signed   By: Limin  Xu M.D.   On: 05/16/2024 10:10   DG Abd Portable 1 View Result Date: 05/15/2024 CLINICAL DATA:  Enteric catheter placement EXAM: PORTABLE ABDOMEN - 1 VIEW COMPARISON:  05/15/2024 FINDINGS: Supine frontal view of the abdomen and pelvis excludes the bilateral flanks, right hemidiaphragm, and pubic symphysis by collimation. Enteric catheter tip and side port project over the gastric fundus. Continue distension the stomach and small bowel. No masses or abnormal calcifications. IMPRESSION: 1. Enteric catheter tip and side port projecting over the gastric fundus. 2. Persistent gaseous distention of the stomach and small bowel consistent with obstruction. Electronically Signed   By: Ozell Daring M.D.   On: 05/15/2024 20:03   DG Abd Portable 1 View Result Date: 05/15/2024 CLINICAL DATA:  NG tube placement EXAM: PORTABLE ABDOMEN - 1 VIEW  COMPARISON:  05/02/2015 FINDINGS: NG tube tip is in the right lower lobe airway. Recommend complete removal and replacement. Mild gaseous distention of the stomach. IMPRESSION: NG tube tip in the right lower lobe airway. Recommend complete removal and replacement. These results were called by telephone at the time of interpretation on 05/15/2024 at 7:39 pm to provider CURTISTINE DAWN , who verbally acknowledged these results. Electronically Signed   By: Franky Crease M.D.   On: 05/15/2024 19:39   CT ABDOMEN PELVIS WO CONTRAST Result Date: 05/14/2024 CLINICAL DATA:  Bowel obstruction suspected. Nausea and vomiting. History of lung cancer and prostate cancer. EXAM: CT ABDOMEN AND PELVIS WITHOUT CONTRAST TECHNIQUE: Multidetector CT imaging of the abdomen and pelvis was performed following  the standard protocol without IV contrast. RADIATION DOSE REDUCTION: This exam was performed according to the departmental dose-optimization program which includes automated exposure control, adjustment of the mA and/or kV according to patient size and/or use of iterative reconstruction technique. COMPARISON:  CT abdomen and pelvis 08/28/2023. FINDINGS: Lower chest: Post treatment changes in the right lung are partially imaged and unchanged. No acute findings. Hepatobiliary: No focal liver abnormality is seen. No gallstones, gallbladder wall thickening, or biliary dilatation. Pancreas: Unremarkable. No pancreatic ductal dilatation or surrounding inflammatory changes. Spleen: Normal in size without focal abnormality. Adrenals/Urinary Tract: There are punctate nonobstructing bilateral renal calculi measuring up to 7 mm. There is no hydronephrosis. There is a cyst in the superior pole of the left kidney measuring 1 cm. The bladder is completely decompressed and not well evaluated. There some questionable mild inflammation surrounding the bladder. The adrenal glands appear normal. Stomach/Bowel: There are numerous dilated proximal small  bowel loops with air-fluid levels measuring up to 5 cm. Transition point is seen in the central abdomen image 2/60. Distal small bowel is completely decompressed. There is no pneumatosis or free air. The stomach is dilated with air-fluid level. Colon is nondilated. The appendix is within normal limits. There is sigmoid colon diverticulosis. Vascular/Lymphatic: Aortic atherosclerosis. No enlarged abdominal or pelvic lymph nodes. Reproductive: Prostate radiotherapy seeds are present. Other: No abdominal wall hernia or abnormality. No abdominopelvic ascites. Musculoskeletal: Degenerative changes affect the spine. No acute osseous abnormality. IMPRESSION: 1. High-grade small bowel obstruction with transition point in the central abdomen. 2. Nonobstructing bilateral renal calculi. 3. Questionable mild inflammation surrounding the bladder. Correlate clinically for cystitis. 4. Aortic atherosclerosis. Aortic Atherosclerosis (ICD10-I70.0). Electronically Signed   By: Greig Pique M.D.   On: 05/14/2024 17:31        Scheduled Meds:  budesonide -glycopyrrolate -formoterol   2 puff Inhalation BID   sodium chloride  flush  3 mL Intravenous Q12H   Continuous Infusions:  sodium chloride  125 mL/hr at 05/16/24 1000     LOS: 1 day    Time spent: 45 minutes spent on chart review, discussion with nursing staff, consultants, updating family and interview/physical exam; more than 50% of that time was spent in counseling and/or coordination of care.    Harlene RAYMOND Bowl, DO [facility] Available via Epic secure chat 7am-7pm After these hours, please refer to coverage provider listed on [email or web address] 05/16/2024, 12:27 PM

## 2024-05-16 NOTE — Plan of Care (Signed)
   Problem: Coping: Goal: Level of anxiety will decrease Outcome: Progressing

## 2024-05-16 NOTE — Plan of Care (Signed)
  Problem: Education: Goal: Knowledge of General Education information will improve Description: Including pain rating scale, medication(s)/side effects and non-pharmacologic comfort measures Outcome: Progressing   Problem: Health Behavior/Discharge Planning: Goal: Ability to manage health-related needs will improve Outcome: Progressing   Problem: Clinical Measurements: Goal: Diagnostic test results will improve Outcome: Progressing   Problem: Coping: Goal: Level of anxiety will decrease Outcome: Progressing   Problem: Elimination: Goal: Will not experience complications related to bowel motility Outcome: Progressing   Problem: Pain Managment: Goal: General experience of comfort will improve and/or be controlled Outcome: Progressing

## 2024-05-17 ENCOUNTER — Inpatient Hospital Stay (HOSPITAL_COMMUNITY)

## 2024-05-17 DIAGNOSIS — K56609 Unspecified intestinal obstruction, unspecified as to partial versus complete obstruction: Secondary | ICD-10-CM | POA: Diagnosis not present

## 2024-05-17 LAB — CBC
HCT: 35.6 % — ABNORMAL LOW (ref 39.0–52.0)
Hemoglobin: 12 g/dL — ABNORMAL LOW (ref 13.0–17.0)
MCH: 34.2 pg — ABNORMAL HIGH (ref 26.0–34.0)
MCHC: 33.7 g/dL (ref 30.0–36.0)
MCV: 101.4 fL — ABNORMAL HIGH (ref 80.0–100.0)
Platelets: 153 K/uL (ref 150–400)
RBC: 3.51 MIL/uL — ABNORMAL LOW (ref 4.22–5.81)
RDW: 11.8 % (ref 11.5–15.5)
WBC: 7.9 K/uL (ref 4.0–10.5)
nRBC: 0 % (ref 0.0–0.2)

## 2024-05-17 LAB — BASIC METABOLIC PANEL WITH GFR
Anion gap: 16 — ABNORMAL HIGH (ref 5–15)
BUN: 34 mg/dL — ABNORMAL HIGH (ref 8–23)
CO2: 24 mmol/L (ref 22–32)
Calcium: 8.6 mg/dL — ABNORMAL LOW (ref 8.9–10.3)
Chloride: 101 mmol/L (ref 98–111)
Creatinine, Ser: 1.38 mg/dL — ABNORMAL HIGH (ref 0.61–1.24)
GFR, Estimated: 53 mL/min — ABNORMAL LOW (ref >60)
Glucose, Bld: 75 mg/dL (ref 70–99)
Potassium: 3.5 mmol/L (ref 3.5–5.1)
Sodium: 141 mmol/L (ref 135–145)

## 2024-05-17 MED ORDER — POTASSIUM CHLORIDE 10 MEQ/100ML IV SOLN
10.0000 meq | INTRAVENOUS | Status: AC
  Administered 2024-05-17 (×4): 10 meq via INTRAVENOUS
  Filled 2024-05-17 (×4): qty 100

## 2024-05-17 MED ORDER — DIPHENHYDRAMINE HCL 50 MG/ML IJ SOLN
25.0000 mg | Freq: Once | INTRAMUSCULAR | Status: AC
Start: 2024-05-17 — End: 2024-05-17
  Administered 2024-05-17: 25 mg via INTRAVENOUS
  Filled 2024-05-17: qty 1

## 2024-05-17 MED ORDER — SODIUM CHLORIDE 0.9 % IV SOLN: INTRAVENOUS | Status: AC

## 2024-05-17 NOTE — Progress Notes (Signed)
 PROGRESS NOTE    Travis Padilla  FMW:[id number] DOB: [birth year] DOA: 05/15/2024 PCP: Jolinda Norene CHRISTELLA, DO    Brief Narrative:  Travis Padilla is a 78 y.o. male with medical history significant for prostate cancer, stage IV lung cancer, COPD, OSA with CPAP intolerance, CAD, hypothyroidism, and anxiety who presents with worsening nausea, abdominal discomfort, and constipation.     Patient reports that his symptoms began several days ago and have continued to worsen despite use of enemas.  He is not passing flatus.  He denies any recent chest pain or shortness of breath.     He was evaluated in an outpatient clinic for this and had labs and CT performed.  CT was concerning for high-grade SBO.   Assessment and Plan:  SBO  - Continue bowel rest, NGT decompression, IVF hydration, monitor electrolytes - Appreciate general surgery consultation - Ambulate   AKI  - No hydronephrosis on CT, likely prerenal  - Continue IVF hydration,-Daily labs   COPD  - Not in exacerbation  - Continue ICS-LAMA-LABA      CAD  - No recent angina      Lung cancer  - Stage IV, followed by Dr. Sherrod, currently under observation      Hypertension  - Treat as-needed only for now    Hypothyroidism  - Use IV levothyroxine  if remains NPO for several days    Obesity Estimated body mass index is 31.84 kg/m as calculated from the following:   Height as of this encounter: 6' 2 (1.88 m).   Weight as of this encounter: 112.5 kg.    DVT prophylaxis: SCDs Start: 05/15/24 2108    Code Status: Full Code Family Communication:   Disposition Plan:  Level of care: Med-Surg Status is: Inpatient     Consultants:  General surgery  Subjective: No nausea  Objective: Vitals:   05/17/24 0152 05/17/24 0500 05/17/24 0632 05/17/24 0906  BP: 116/68  117/77   Pulse: 90  92   Resp: 16  16   Temp: 98.9 F (37.2 C)  98.3 F (36.8 C)   TempSrc: Oral  Oral   SpO2: 94%  95% 94%  Weight:  112.5 kg     Height:        Intake/Output Summary (Last 24 hours) at 05/17/2024 1151 Last data filed at 05/17/2024 0920 Gross per 24 hour  Intake 1644.08 ml  Output 1850 ml  Net -205.92 ml   Filed Weights   05/15/24 2057 05/16/24 0500 05/17/24 0500  Weight: 111.4 kg 112.9 kg 112.5 kg    Examination:   General: Appearance:    Obese male in no acute distress     Lungs:     Clear to auscultation bilaterally  Heart:    Normal heart rate. Normal rhythm. No murmurs, rubs, or gallops.    MS:   All extremities are intact.    Neurologic:   Awake, alert, oriented x 3. No apparent focal neurological           defect.        Data Reviewed: I have personally reviewed following labs and imaging studies  CBC: Recent Labs  Lab 05/15/24 1810 05/16/24 0509 05/17/24 0445  WBC 12.0* 9.7 7.9  HGB 15.0 13.3 12.0*  HCT 43.3 38.5* 35.6*  MCV 97.1 99.2 101.4*  PLT 221 170 153   Basic Metabolic Panel: Recent Labs  Lab 05/14/24 1522 05/15/24 1810 05/16/24 0509 05/17/24 0445  NA 142 134* 133* 141  K 4.4  4.3 4.0 3.5  CL 90* 88* 93* 101  CO2 26 27 27 24   GLUCOSE 157* 112* 103* 75  BUN 33* 59* 54* 34*  CREATININE 1.99* 2.84* 2.26* 1.38*  CALCIUM 10.6* 9.8 8.7* 8.6*   GFR: Estimated Creatinine Clearance: 60.7 mL/min (A) (by C-G formula based on SCr of 1.38 mg/dL (H)). Liver Function Tests: Recent Labs  Lab 05/14/24 1522 05/15/24 1810 05/16/24 0509  AST 22 24 21   ALT 13 12 11   ALKPHOS 72 64 56  BILITOT 1.1 1.6* 1.3*  PROT 8.0 7.9 6.7  ALBUMIN  4.8 4.5 3.9   Recent Labs  Lab 05/15/24 1810  LIPASE 51   No results for input(s): AMMONIA in the last 168 hours. Coagulation Profile: No results for input(s): INR, PROTIME in the last 168 hours. Cardiac Enzymes: No results for input(s): CKTOTAL, CKMB, CKMBINDEX, TROPONINI in the last 168 hours. BNP (last 3 results) No results for input(s): PROBNP in the last 8760 hours. HbA1C: No results for input(s): HGBA1C in the last  72 hours. CBG: No results for input(s): GLUCAP in the last 168 hours. Lipid Profile: No results for input(s): CHOL, HDL, LDLCALC, TRIG, CHOLHDL, LDLDIRECT in the last 72 hours. Thyroid  Function Tests: No results for input(s): TSH, T4TOTAL, FREET4, T3FREE, THYROIDAB in the last 72 hours. Anemia Panel: No results for input(s): VITAMINB12, FOLATE, FERRITIN, TIBC, IRON , RETICCTPCT in the last 72 hours. Sepsis Labs: No results for input(s): PROCALCITON, LATICACIDVEN in the last 168 hours.  No results found for this or any previous visit (from the past 240 hours).       Radiology Studies: DG Abd 2 Views Result Date: 05/17/2024 CLINICAL DATA:  Small-bowel obstruction EXAM: ABDOMEN - 2 VIEW COMPARISON:  Abdominal radiograph dated 05/16/2024 FINDINGS: Gastric/enteric tube tip projects over the stomach. Similar gas-filled mildly dilated bowel loops remain in the left hemiabdomen. Prostate fiducials project over the lower midline pelvis. IMPRESSION: Similar gas-filled mildly dilated bowel loops remain in the left hemiabdomen. Electronically Signed   By: Limin  Xu M.D.   On: 05/17/2024 10:50   DG Abd 2 Views Result Date: 05/16/2024 CLINICAL DATA:  Small-bowel obstruction EXAM: ABDOMEN - 2 VIEW COMPARISON:  Abdominal radiograph dated 05/15/2024 FINDINGS: Gastric/enteric tube tip projects over the stomach. Partially imaged right chest wall port tip projects over the superior cavoatrial junction. Partially imaged prostate fiducials. Decreased number of gas-filled dilated bowel loops in the left hemiabdomen. IMPRESSION: Decreased number of gas-filled dilated bowel loops in the left hemiabdomen. Electronically Signed   By: Limin  Xu M.D.   On: 05/16/2024 10:10   DG Abd Portable 1 View Result Date: 05/15/2024 CLINICAL DATA:  Enteric catheter placement EXAM: PORTABLE ABDOMEN - 1 VIEW COMPARISON:  05/15/2024 FINDINGS: Supine frontal view of the abdomen and pelvis excludes  the bilateral flanks, right hemidiaphragm, and pubic symphysis by collimation. Enteric catheter tip and side port project over the gastric fundus. Continue distension the stomach and small bowel. No masses or abnormal calcifications. IMPRESSION: 1. Enteric catheter tip and side port projecting over the gastric fundus. 2. Persistent gaseous distention of the stomach and small bowel consistent with obstruction. Electronically Signed   By: Ozell Daring M.D.   On: 05/15/2024 20:03   DG Abd Portable 1 View Result Date: 05/15/2024 CLINICAL DATA:  NG tube placement EXAM: PORTABLE ABDOMEN - 1 VIEW COMPARISON:  05/02/2015 FINDINGS: NG tube tip is in the right lower lobe airway. Recommend complete removal and replacement. Mild gaseous distention of the stomach. IMPRESSION: NG tube tip in  the right lower lobe airway. Recommend complete removal and replacement. These results were called by telephone at the time of interpretation on 05/15/2024 at 7:39 pm to provider CURTISTINE DAWN , who verbally acknowledged these results. Electronically Signed   By: Franky Crease M.D.   On: 05/15/2024 19:39        Scheduled Meds:  budesonide -glycopyrrolate -formoterol   2 puff Inhalation BID   sodium chloride  flush  3 mL Intravenous Q12H   Continuous Infusions:  sodium chloride  100 mL/hr at 05/17/24 0943     LOS: 2 days    Time spent: 45 minutes spent on chart review, discussion with nursing staff, consultants, updating family and interview/physical exam; more than 50% of that time was spent in counseling and/or coordination of care.    Travis RAYMOND Bowl, DO [facility] Available via Epic secure chat 7am-7pm After these hours, please refer to coverage provider listed on [email or web address] 05/17/2024, 11:51 AM

## 2024-05-17 NOTE — Plan of Care (Signed)
  Problem: Clinical Measurements: Goal: Ability to maintain clinical measurements within normal limits will improve Outcome: Progressing   Problem: Activity: Goal: Risk for activity intolerance will decrease Outcome: Progressing   Problem: Nutrition: Goal: Adequate nutrition will be maintained Outcome: Progressing   Problem: Pain Managment: Goal: General experience of comfort will improve and/or be controlled Outcome: Progressing

## 2024-05-17 NOTE — Progress Notes (Signed)
 Subjective/Chief Complaint: No complaints.  He states his abdomen feels softer than yesterday.  He has not passed flatus yet.  He still has a significant volume coming out the NG tube   Objective: Vital signs in last 24 hours: Temp:  [98.3 F (36.8 C)-98.9 F (37.2 C)] 98.3 F (36.8 C) (09/07 9367) Pulse Rate:  [90-99] 92 (09/07 0632) Resp:  [16-17] 16 (09/07 9367) BP: (113-117)/(68-77) 117/77 (09/07 9367) SpO2:  [94 %-97 %] 95 % (09/07 9367) Weight:  [112.5 kg] 112.5 kg (09/07 0500) Last BM Date : 05/11/24  Intake/Output from previous day: 09/06 0701 - 09/07 0700 In: 1747.4 [I.V.:1747.4] Out: 2220 [Urine:920; Emesis/NG output:1300] Intake/Output this shift: Total I/O In: 30 [P.O.:30] Out: 100 [Emesis/NG output:100]  General appearance: alert and cooperative Resp: clear to auscultation bilaterally Cardio: regular rate and rhythm GI: Soft, mild distention, nontender.  Lab Results:  Recent Labs    05/16/24 0509 05/17/24 0445  WBC 9.7 7.9  HGB 13.3 12.0*  HCT 38.5* 35.6*  PLT 170 153   BMET Recent Labs    05/16/24 0509 05/17/24 0445  NA 133* 141  K 4.0 3.5  CL 93* 101  CO2 27 24  GLUCOSE 103* 75  BUN 54* 34*  CREATININE 2.26* 1.38*  CALCIUM 8.7* 8.6*   PT/INR No results for input(s): LABPROT, INR in the last 72 hours. ABG No results for input(s): PHART, HCO3 in the last 72 hours.  Invalid input(s): PCO2, PO2  Studies/Results: DG Abd 2 Views Result Date: 05/16/2024 CLINICAL DATA:  Small-bowel obstruction EXAM: ABDOMEN - 2 VIEW COMPARISON:  Abdominal radiograph dated 05/15/2024 FINDINGS: Gastric/enteric tube tip projects over the stomach. Partially imaged right chest wall port tip projects over the superior cavoatrial junction. Partially imaged prostate fiducials. Decreased number of gas-filled dilated bowel loops in the left hemiabdomen. IMPRESSION: Decreased number of gas-filled dilated bowel loops in the left hemiabdomen. Electronically  Signed   By: Limin  Xu M.D.   On: 05/16/2024 10:10   DG Abd Portable 1 View Result Date: 05/15/2024 CLINICAL DATA:  Enteric catheter placement EXAM: PORTABLE ABDOMEN - 1 VIEW COMPARISON:  05/15/2024 FINDINGS: Supine frontal view of the abdomen and pelvis excludes the bilateral flanks, right hemidiaphragm, and pubic symphysis by collimation. Enteric catheter tip and side port project over the gastric fundus. Continue distension the stomach and small bowel. No masses or abnormal calcifications. IMPRESSION: 1. Enteric catheter tip and side port projecting over the gastric fundus. 2. Persistent gaseous distention of the stomach and small bowel consistent with obstruction. Electronically Signed   By: Ozell Daring M.D.   On: 05/15/2024 20:03   DG Abd Portable 1 View Result Date: 05/15/2024 CLINICAL DATA:  NG tube placement EXAM: PORTABLE ABDOMEN - 1 VIEW COMPARISON:  05/02/2015 FINDINGS: NG tube tip is in the right lower lobe airway. Recommend complete removal and replacement. Mild gaseous distention of the stomach. IMPRESSION: NG tube tip in the right lower lobe airway. Recommend complete removal and replacement. These results were called by telephone at the time of interpretation on 05/15/2024 at 7:39 pm to provider CURTISTINE DAWN , who verbally acknowledged these results. Electronically Signed   By: Franky Crease M.D.   On: 05/15/2024 19:39    Anti-infectives: Anti-infectives (From admission, onward)    None       Assessment/Plan: s/p * No surgery found * Sbo seems slightly improved although no output yet Continue ng and bowel rest IV hydration. Cr improving Repeat abd xrays today Metastatic lung cancer  LOS: 2 days    Deward Null III 05/17/2024

## 2024-05-17 NOTE — Plan of Care (Signed)
 ?  Problem: Clinical Measurements: ?Goal: Ability to maintain clinical measurements within normal limits will improve ?Outcome: Progressing ?Goal: Will remain free from infection ?Outcome: Progressing ?Goal: Diagnostic test results will improve ?Outcome: Progressing ?  ?

## 2024-05-18 ENCOUNTER — Ambulatory Visit: Payer: Self-pay | Admitting: Family Medicine

## 2024-05-18 ENCOUNTER — Inpatient Hospital Stay (HOSPITAL_COMMUNITY): Admitting: Anesthesiology

## 2024-05-18 ENCOUNTER — Encounter (HOSPITAL_COMMUNITY): Payer: Self-pay | Admitting: Family Medicine

## 2024-05-18 ENCOUNTER — Encounter (HOSPITAL_COMMUNITY)
Admission: EM | Disposition: A | Discharge status: Home / Self Care | Payer: Self-pay | Source: Home / Self Care | Attending: Internal Medicine

## 2024-05-18 DIAGNOSIS — J449 Chronic obstructive pulmonary disease, unspecified: Secondary | ICD-10-CM

## 2024-05-18 DIAGNOSIS — K56609 Unspecified intestinal obstruction, unspecified as to partial versus complete obstruction: Secondary | ICD-10-CM | POA: Diagnosis not present

## 2024-05-18 DIAGNOSIS — I10 Essential (primary) hypertension: Secondary | ICD-10-CM

## 2024-05-18 DIAGNOSIS — K565 Intestinal adhesions [bands], unspecified as to partial versus complete obstruction: Secondary | ICD-10-CM

## 2024-05-18 DIAGNOSIS — I2510 Atherosclerotic heart disease of native coronary artery without angina pectoris: Secondary | ICD-10-CM | POA: Diagnosis not present

## 2024-05-18 HISTORY — PX: LAPAROSCOPY: SHX197

## 2024-05-18 LAB — CBC
HCT: 37.8 % — ABNORMAL LOW (ref 39.0–52.0)
Hemoglobin: 12.3 g/dL — ABNORMAL LOW (ref 13.0–17.0)
MCH: 33.1 pg (ref 26.0–34.0)
MCHC: 32.5 g/dL (ref 30.0–36.0)
MCV: 101.6 fL — ABNORMAL HIGH (ref 80.0–100.0)
Platelets: 157 K/uL (ref 150–400)
RBC: 3.72 MIL/uL — ABNORMAL LOW (ref 4.22–5.81)
RDW: 11.6 % (ref 11.5–15.5)
WBC: 6.8 K/uL (ref 4.0–10.5)
nRBC: 0 % (ref 0.0–0.2)

## 2024-05-18 LAB — BASIC METABOLIC PANEL WITH GFR
Anion gap: 17 — ABNORMAL HIGH (ref 5–15)
BUN: 22 mg/dL (ref 8–23)
CO2: 22 mmol/L (ref 22–32)
Calcium: 8.9 mg/dL (ref 8.9–10.3)
Chloride: 106 mmol/L (ref 98–111)
Creatinine, Ser: 1.11 mg/dL (ref 0.61–1.24)
GFR, Estimated: >60 mL/min (ref >60)
Glucose, Bld: 75 mg/dL (ref 70–99)
Potassium: 4.1 mmol/L (ref 3.5–5.1)
Sodium: 145 mmol/L (ref 135–145)

## 2024-05-18 LAB — TYPE AND SCREEN
ABO/RH(D): O POS
Antibody Screen: NEGATIVE

## 2024-05-18 LAB — MAGNESIUM: Magnesium: 2.5 mg/dL — ABNORMAL HIGH (ref 1.7–2.4)

## 2024-05-18 SURGERY — LAPAROSCOPY, DIAGNOSTIC
Anesthesia: General

## 2024-05-18 MED ORDER — 0.9 % SODIUM CHLORIDE (POUR BTL) OPTIME
TOPICAL | Status: DC | PRN
  Administered 2024-05-18: 2000 mL

## 2024-05-18 MED ORDER — DEXAMETHASONE SODIUM PHOSPHATE 10 MG/ML IJ SOLN
INTRAMUSCULAR | Status: DC | PRN
  Administered 2024-05-18: 10 mg via INTRAVENOUS

## 2024-05-18 MED ORDER — FENTANYL CITRATE PF 50 MCG/ML IJ SOSY
25.0000 ug | PREFILLED_SYRINGE | INTRAMUSCULAR | Status: DC | PRN
  Administered 2024-05-18: 50 ug via INTRAVENOUS

## 2024-05-18 MED ORDER — FENTANYL CITRATE (PF) 100 MCG/2ML IJ SOLN
INTRAMUSCULAR | Status: AC
  Filled 2024-05-18: qty 2

## 2024-05-18 MED ORDER — FENTANYL CITRATE (PF) 100 MCG/2ML IJ SOLN
INTRAMUSCULAR | Status: DC | PRN
  Administered 2024-05-18: 100 ug via INTRAVENOUS
  Administered 2024-05-18: 50 ug via INTRAVENOUS

## 2024-05-18 MED ORDER — SODIUM CHLORIDE 0.9 % IV SOLN: INTRAVENOUS | Status: AC

## 2024-05-18 MED ORDER — ONDANSETRON HCL 4 MG/2ML IJ SOLN
INTRAMUSCULAR | Status: DC | PRN
  Administered 2024-05-18: 4 mg via INTRAVENOUS

## 2024-05-18 MED ORDER — SUCCINYLCHOLINE CHLORIDE 200 MG/10ML IV SOSY
PREFILLED_SYRINGE | INTRAVENOUS | Status: DC | PRN
  Administered 2024-05-18: 120 mg via INTRAVENOUS

## 2024-05-18 MED ORDER — FENTANYL CITRATE PF 50 MCG/ML IJ SOSY
PREFILLED_SYRINGE | INTRAMUSCULAR | Status: AC
  Filled 2024-05-18: qty 1

## 2024-05-18 MED ORDER — BUPIVACAINE-EPINEPHRINE (PF) 0.25% -1:200000 IJ SOLN
INTRAMUSCULAR | Status: AC
  Filled 2024-05-18: qty 30

## 2024-05-18 MED ORDER — LACTATED RINGERS IV SOLN: INTRAVENOUS | Status: DC

## 2024-05-18 MED ORDER — CEFAZOLIN SODIUM-DEXTROSE 2-4 GM/100ML-% IV SOLN
2.0000 g | INTRAVENOUS | Status: AC
  Administered 2024-05-18: 2 g via INTRAVENOUS

## 2024-05-18 MED ORDER — CEFAZOLIN SODIUM-DEXTROSE 2-4 GM/100ML-% IV SOLN
INTRAVENOUS | Status: AC
  Filled 2024-05-18: qty 100

## 2024-05-18 MED ORDER — LACTATED RINGERS IR SOLN
Status: DC | PRN
  Administered 2024-05-18: 1000 mL

## 2024-05-18 MED ORDER — LIDOCAINE 2% (20 MG/ML) 5 ML SYRINGE
INTRAMUSCULAR | Status: DC | PRN
  Administered 2024-05-18: 60 mg via INTRAVENOUS

## 2024-05-18 MED ORDER — CHLORHEXIDINE GLUCONATE 0.12 % MT SOLN
15.0000 mL | Freq: Once | OROMUCOSAL | Status: AC
  Administered 2024-05-18: 15 mL via OROMUCOSAL

## 2024-05-18 MED ORDER — BUPIVACAINE-EPINEPHRINE 0.25% -1:200000 IJ SOLN
INTRAMUSCULAR | Status: DC | PRN
  Administered 2024-05-18: 30 mL

## 2024-05-18 MED ORDER — AMISULPRIDE (ANTIEMETIC) 5 MG/2ML IV SOLN: 10.0000 mg | Freq: Once | INTRAVENOUS | Status: DC | PRN

## 2024-05-18 MED ORDER — ROCURONIUM BROMIDE 10 MG/ML (PF) SYRINGE
PREFILLED_SYRINGE | INTRAVENOUS | Status: DC | PRN
  Administered 2024-05-18: 50 mg via INTRAVENOUS

## 2024-05-18 MED ORDER — SUGAMMADEX SODIUM 200 MG/2ML IV SOLN
INTRAVENOUS | Status: DC | PRN
  Administered 2024-05-18: 400 mg via INTRAVENOUS

## 2024-05-18 MED ORDER — PROPOFOL 10 MG/ML IV BOLUS
INTRAVENOUS | Status: DC | PRN
  Administered 2024-05-18: 120 mg via INTRAVENOUS

## 2024-05-18 MED ORDER — SUGAMMADEX SODIUM 200 MG/2ML IV SOLN
INTRAVENOUS | Status: AC
  Filled 2024-05-18: qty 2

## 2024-05-18 MED ORDER — INFLUENZA VAC SPLIT HIGH-DOSE 0.5 ML IM SUSY
0.5000 mL | PREFILLED_SYRINGE | INTRAMUSCULAR | Status: DC
Start: 2024-05-19 — End: 2024-05-21
  Filled 2024-05-18: qty 0.5

## 2024-05-18 SURGICAL SUPPLY — 26 items
BAG COUNTER SPONGE SURGICOUNT (BAG) IMPLANT
CABLE HIGH FREQUENCY MONO STRZ (ELECTRODE) IMPLANT
COVER SURGICAL LIGHT HANDLE (MISCELLANEOUS) ×1 IMPLANT
DERMABOND ADVANCED .7 DNX12 (GAUZE/BANDAGES/DRESSINGS) IMPLANT
DRAPE WARM FLUID 44X44 (DRAPES) ×1 IMPLANT
ELECT REM PT RETURN 15FT ADLT (MISCELLANEOUS) ×1 IMPLANT
GLOVE BIOGEL PI IND STRL 7.0 (GLOVE) ×1 IMPLANT
GLOVE SURG SS PI 7.0 STRL IVOR (GLOVE) ×2 IMPLANT
GOWN STRL REUS W/ TWL LRG LVL3 (GOWN DISPOSABLE) ×1 IMPLANT
GOWN STRL REUS W/ TWL XL LVL3 (GOWN DISPOSABLE) IMPLANT
IRRIGATION SUCT STRKRFLW 2 WTP (MISCELLANEOUS) IMPLANT
KIT BASIN OR (CUSTOM PROCEDURE TRAY) ×1 IMPLANT
KIT TURNOVER KIT A (KITS) ×1 IMPLANT
SHEARS HARMONIC 36 ACE (MISCELLANEOUS) IMPLANT
SLEEVE Z-THREAD 5X100MM (TROCAR) ×1 IMPLANT
SPIKE FLUID TRANSFER (MISCELLANEOUS) IMPLANT
SUT MNCRL AB 4-0 PS2 18 (SUTURE) ×1 IMPLANT
SUT SILK 2 0 SH CR/8 (SUTURE) IMPLANT
SUT SILK 2-0 18XBRD TIE 12 (SUTURE) IMPLANT
SUT SILK 3 0 SH CR/8 (SUTURE) IMPLANT
SUT SILK 3-0 18XBRD TIE 12 (SUTURE) IMPLANT
TOWEL OR 17X26 10 PK STRL BLUE (TOWEL DISPOSABLE) ×1 IMPLANT
TRAY FOLEY MTR SLVR 16FR STAT (SET/KITS/TRAYS/PACK) IMPLANT
TRAY LAPAROSCOPIC (CUSTOM PROCEDURE TRAY) ×1 IMPLANT
TROCAR ADV FIXATION 12X100MM (TROCAR) IMPLANT
TROCAR Z-THREAD OPTICAL 5X100M (TROCAR) ×1 IMPLANT

## 2024-05-18 NOTE — Anesthesia Preprocedure Evaluation (Addendum)
 Anesthesia Evaluation  Patient identified by MRN, date of birth, ID band Patient awake    Reviewed: Allergy & Precautions, NPO status , Patient's Chart, lab work & pertinent test results  History of Anesthesia Complications Negative for: history of anesthetic complications  Airway Mallampati: III  TM Distance: >3 FB Neck ROM: Full    Dental  (+) Dental Advisory Given   Pulmonary neg shortness of breath, sleep apnea , COPD,  COPD inhaler, neg recent URI, former smoker Metastatic lung cancer   Pulmonary exam normal breath sounds clear to auscultation       Cardiovascular hypertension (carvedilol ), Pt. on home beta blockers (-) angina + CAD and + Cardiac Stents  + dysrhythmias (palpitations, RBBB)  Rhythm:Regular Rate:Normal  HLD  05/26/2021:   Prox RCA-1 lesion is 95% stenosed.   Prox RCA-2 lesion is 70% stenosed.   Mid RCA lesion is 80% stenosed.   A stent was successfully placed.   A stent was successfully placed.   Post intervention, there is a 0% residual stenosis.   Post intervention, there is a 0% residual stenosis.   Post intervention, there is a 0% residual stenosis.   1.  Successful stenting of high-grade calcified serial lesions of the proximal and mid right coronary artery with 2 overlapping drug-eluting stents with IVUS optimization.  TTE 01/10/2021: IMPRESSIONS    1. Left ventricular ejection fraction, by estimation, is 50 to 55%. The  left ventricle has low normal function. The left ventricle has no regional  wall motion abnormalities. There is mild concentric left ventricular  hypertrophy. Indeterminate diastolic  filling due to E-A fusion.   2. Right ventricular systolic function is normal. The right ventricular  size is normal. Tricuspid regurgitation signal is inadequate for assessing  PA pressure.   3. The mitral valve is grossly normal. No evidence of mitral valve  regurgitation. No evidence of  mitral stenosis.   4. The aortic valve is tricuspid. Aortic valve regurgitation is trivial.  No aortic stenosis is present.   5. The inferior vena cava is normal in size with greater than 50%  respiratory variability, suggesting right atrial pressure of 3 mmHg.      Neuro/Psych neg Seizures PSYCHIATRIC DISORDERS Anxiety      Neuromuscular disease (neuropathy)    GI/Hepatic Neg liver ROS,GERD  Medicated,,SBO, diverticulosis    Endo/Other  neg diabetesHypothyroidism    Renal/GU ARFRenal disease   H/o prostate cancer    Musculoskeletal  (+) Arthritis ,    Abdominal  (+) + obese  Peds  Hematology  (+) Blood dyscrasia, anemia Lab Results      Component                Value               Date                      WBC                      6.8                 05/18/2024                HGB                      12.3 (L)            05/18/2024  HCT                      37.8 (L)            05/18/2024                MCV                      101.6 (H)           05/18/2024                PLT                      157                 05/18/2024              Anesthesia Other Findings Last clopidogrel : 1 week ago  NGT in place  Reproductive/Obstetrics                              Anesthesia Physical Anesthesia Plan  ASA: 3  Anesthesia Plan: General   Post-op Pain Management:    Induction: Intravenous and Rapid sequence  PONV Risk Score and Plan: 2 and Ondansetron , Dexamethasone  and Treatment may vary due to age or medical condition  Airway Management Planned: Oral ETT  Additional Equipment:   Intra-op Plan:   Post-operative Plan: Extubation in OR  Informed Consent: I have reviewed the patients History and Physical, chart, labs and discussed the procedure including the risks, benefits and alternatives for the proposed anesthesia with the patient or authorized representative who has indicated his/her understanding and acceptance.      Dental advisory given  Plan Discussed with: CRNA and Anesthesiologist  Anesthesia Plan Comments: (Risks of general anesthesia discussed including, but not limited to, sore throat, hoarse voice, chipped/damaged teeth, injury to vocal cords, nausea and vomiting, allergic reactions, lung infection, heart attack, stroke, and death. All questions answered. )         Anesthesia Quick Evaluation

## 2024-05-18 NOTE — Op Note (Signed)
 Preoperative diagnosis: SBO  Postoperative diagnosis: same   Procedure: laparoscopic lysis of adhesions  Surgeon: Herlene Bureau, M.D.  Asst: none  Anesthesia: GETA  Indications for procedure: Travis Padilla is a 78 y.o. year old male with symptoms of abdominal pain, vomiting. He was found to have concerns for bowel obstruction. NG tube was placed. He did not improve over the next 3 days and was brought to surgery for exploration.  Description of procedure: The patient was brought into the operative suite. Anesthesia was administered with General endotracheal anesthesia. WHO checklist was applied. The patient was then placed in supine position. The area was prepped and draped in the usual sterile fashion.  Next, a left subcostal incision was made. A 5mm trocar was used to gain access to the peritoneal cavity by optical entry technique. Pneumoperitoneum was applied with a high flow and low pressure. The laparoscope was reinserted to confirm position.  No adhesions were seen to the anterior abdominal wall, dilated intestine was seen. Left and Right TAP blocks were placed with marcaine . 2 additional trocars were placed in the left mid abdomen and left lower abdomen.  The intestine was then run from the ileocecal valve back to the ligament of Trietz. In the ileum there seems to be some resistance but no adhesions were seen. There was a transition to very dilated loops of intestine. There was no evidence of mass or intussusception. There was some hemorrhagic areas on the dilated areas of small intestine but no ischemia or necrosis. In looking at the rest of the abdomen. There were inguinal hernias on both sides that were small. There was some fat of the sigmoid colon adhered to the left abdomen wall which was cut free with scissors. The omentum and transverse colon mesentery were inspected and had no adhesions to the small intestine mesentery or other adhesions causing internal hernias. Cautery was  used in 2 areas on the omentum as it bled from manipulation. No reason for mechanical bowel obstruction was found. NG tube was left in. Trocars and pneumoperitoneum were removed. Incisions were closed with 4-0 monocryl and dermabond was used for dressing.  Findings: small bilateral inguinal hernias, adhesions of sigmoid colon to the left abdomen wall, dilation of the mid jejunum with some hemorrhagic changes. no definite evidence of mechanical bowel obstruction  Specimen: none  Implant: none   Blood loss: 5 ml  Local anesthesia: 30 ml marcaine    Complications: none  Herlene Bureau, M.D. General, Bariatric, & Minimally Invasive Surgery [facility]

## 2024-05-18 NOTE — Anesthesia Postprocedure Evaluation (Signed)
 Anesthesia Post Note  Patient: Travis Padilla  Procedure(s) Performed: LAPAROSCOPY, DIAGNOSTIC     Patient location during evaluation: PACU Anesthesia Type: General Level of consciousness: awake Pain management: pain level controlled Vital Signs Assessment: post-procedure vital signs reviewed and stable Respiratory status: spontaneous breathing, nonlabored ventilation and respiratory function stable Cardiovascular status: blood pressure returned to baseline and stable Postop Assessment: no apparent nausea or vomiting Anesthetic complications: no   No notable events documented.  Last Vitals:  Vitals:   05/18/24 1230 05/18/24 1244  BP: (!) 143/81 (!) 142/62  Pulse: 83 90  Resp: 15 17  Temp: 36.9 C 36.7 C  SpO2: 99% 100%    Last Pain:  Vitals:   05/18/24 1244  TempSrc: Oral  PainSc:                  Delon Aisha Arch

## 2024-05-18 NOTE — Anesthesia Procedure Notes (Signed)
 Procedure Name: Intubation Date/Time: 05/18/2024 10:53 AM  Performed by: Cindie Charleen PARAS, CRNAPre-anesthesia Checklist: Patient identified, Emergency Drugs available, Suction available, Patient being monitored and Timeout performed Patient Re-evaluated:Patient Re-evaluated prior to induction Oxygen Delivery Method: Circle system utilized Preoxygenation: Pre-oxygenation with 100% oxygen Induction Type: IV induction and Rapid sequence Laryngoscope Size: Mac and 4 Grade View: Grade II Tube type: Oral Tube size: 7.5 mm Number of attempts: 1 Airway Equipment and Method: Stylet Placement Confirmation: ETT inserted through vocal cords under direct vision, positive ETCO2 and breath sounds checked- equal and bilateral Secured at: 23 cm Tube secured with: Tape Dental Injury: Teeth and Oropharynx as per pre-operative assessment

## 2024-05-18 NOTE — Progress Notes (Signed)
      Chief Complaint/Subjective: No flatus or BMs, tube is annoying and has nasal mucous build up  Objective: Vital signs in last 24 hours: Temp:  [98.4 F (36.9 C)-98.7 F (37.1 C)] 98.4 F (36.9 C) (09/08 0452) Pulse Rate:  [89-96] 95 (09/08 0452) Resp:  [15-16] 15 (09/08 0452) BP: (122-133)/(74-80) 125/80 (09/08 0452) SpO2:  [94 %-98 %] 96 % (09/08 0452) Last BM Date : 05/11/24 Intake/Output from previous day: 09/07 0701 - 09/08 0700 In: 2353.5 [P.O.:30; I.V.:1930.3; IV Piggyback:393.2] Out: 1800 [Urine:700; Emesis/NG output:1100]  PE: Gen: NAD Resp: non Card: RRR Abd: soft, nontender, mild distension  Lab Results:  Recent Labs    05/17/24 0445 05/18/24 0415  WBC 7.9 6.8  HGB 12.0* 12.3*  HCT 35.6* 37.8*  PLT 153 157   Recent Labs    05/17/24 0445 05/18/24 0415  NA 141 145  K 3.5 4.1  CL 101 106  CO2 24 22  GLUCOSE 75 75  BUN 34* 22  CREATININE 1.38* 1.11  CALCIUM 8.6* 8.9   No results for input(s): LABPROT, INR in the last 72 hours.    Component Value Date/Time   NA 145 05/18/2024 0415   NA 142 05/14/2024 1522   K 4.1 05/18/2024 0415   CL 106 05/18/2024 0415   CO2 22 05/18/2024 0415   GLUCOSE 75 05/18/2024 0415   BUN 22 05/18/2024 0415   BUN 33 (H) 05/14/2024 1522   CREATININE 1.11 05/18/2024 0415   CREATININE 0.96 11/22/2023 1026   CALCIUM 8.9 05/18/2024 0415   PROT 6.7 05/16/2024 0509   PROT 8.0 05/14/2024 1522   ALBUMIN  3.9 05/16/2024 0509   ALBUMIN  4.8 05/14/2024 1522   AST 21 05/16/2024 0509   AST 21 11/22/2023 1026   ALT 11 05/16/2024 0509   ALT 13 11/22/2023 1026   ALKPHOS 56 05/16/2024 0509   BILITOT 1.3 (H) 05/16/2024 0509   BILITOT 1.1 05/14/2024 1522   BILITOT 0.4 11/22/2023 1026   GFRNONAA >60 05/18/2024 0415   GFRNONAA >60 11/22/2023 1026   GFRAA >60 06/13/2020 0826    Assessment/Plan SBO, no previous abdominal surgery. He had one other obstruction which resolved with chemicals. XR yesterday with multiple  distended loops, CT showing transition in small intestine -OR for laparoscopy      FEN - NPO VTE - SCDs ID - ancef  periop Disposition - OR today   LOS: 3 days   I reviewed last 24 h vitals and pain scores, last 48 h intake and output, last 24 h labs and trends, and last 24 h imaging results.  This care required high  level of medical decision making.   Herlene Righter [facility] 05/18/2024, 7:43 AM Please see Amion for pager number during day hours 7:00am-4:30pm or 7:00am -11:30am on weekends

## 2024-05-18 NOTE — Transfer of Care (Signed)
 Immediate Anesthesia Transfer of Care Note  Patient: Travis Padilla  Procedure(s) Performed: LAPAROSCOPY, DIAGNOSTIC  Patient Location: PACU  Anesthesia Type:General  Level of Consciousness: sedated, patient cooperative, and responds to stimulation  Airway & Oxygen Therapy: Patient Spontanous Breathing and Patient connected to face mask oxygen  Post-op Assessment: Report given to RN and Post -op Vital signs reviewed and stable  Post vital signs: Reviewed and stable  Last Vitals:  Vitals Value Taken Time  BP 163/96 05/18/24 11:43  Temp    Pulse 88 05/18/24 11:45  Resp 19 05/18/24 11:45  SpO2 100 % 05/18/24 11:45  Vitals shown include unfiled device data.  Last Pain:  Vitals:   05/18/24 0915  TempSrc: Oral  PainSc: 0-No pain      Patients Stated Pain Goal: 5 (05/16/24 0900)  Complications: No notable events documented.

## 2024-05-18 NOTE — TOC Initial Note (Signed)
 Transition of Care [facility]) - Initial/Assessment Note    Patient Details  Name: Travis Padilla MRN: [id number] Date of Birth: February 19, 1946  [facility] CM/SW Contact:    Travis JONELLE Rex, RN Phone Number: 05/18/2024, 11:13 AM  Clinical Narrative:   OR today. TOC will continue to follow.                       Patient Goals and CMS Choice            Expected Discharge Plan and Services                                              Prior Living Arrangements/Services                       Activities of Daily Living   ADL Screening (condition at time of admission) Independently performs ADLs?: Yes (appropriate for developmental age) Is the patient deaf or have difficulty hearing?: No Does the patient have difficulty seeing, even when wearing glasses/contacts?: No Does the patient have difficulty concentrating, remembering, or making decisions?: No  Permission Sought/Granted                  Emotional Assessment              Admission diagnosis:  Small bowel obstruction (HCC) [K56.609] SBO (small bowel obstruction) (HCC) [K56.609] Acute kidney injury (HCC) [N17.9] Patient Active Problem List   Diagnosis Date Noted   SBO (small bowel obstruction) (HCC) 05/15/2024   Dyspnea 01/10/2024   AKI (acute kidney injury) (HCC) 03/12/2023   GAD (generalized anxiety disorder) 03/12/2023   GERD (gastroesophageal reflux disease) 03/12/2023   Atypical chest pain 03/11/2023   Malignant neoplasm of prostate (HCC) 02/22/2023   Rectal bleeding 01/17/2023   Acquired dilation of ascending aorta and aortic root (HCC) 05/21/2022   COPD (chronic obstructive pulmonary disease) (HCC) 09/22/2021   Back pain 09/22/2021   Fever 06/29/2021   Acute blood loss anemia    Gastrointestinal hemorrhage associated with acute gastritis    Unstable angina (HCC) 05/25/2021   Coronary artery disease involving native coronary artery of native heart without angina  pectoris 12/07/2020   SIRS (systemic inflammatory response syndrome) (HCC) 11/27/2020   Encephalitis    Thrombocytopenia (HCC)    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   Itching 11/17/2019   Dyslipidemia 07/14/2019   Shoulder pain 03/31/2019   Port-A-Cath in place 08/11/2018   Acquired hypothyroidism 08/11/2018   Metastasis to lung (HCC) 05/19/2018   Encounter for antineoplastic chemotherapy 05/13/2018   Encounter for antineoplastic immunotherapy 05/13/2018   Antiplatelet or antithrombotic long-term use 05/13/2018   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   Axillary adenopathy 04/24/2018   Right lower lobe lung mass 04/22/2018   Cough with hemoptysis 04/22/2018   Wheezing 04/02/2018   Long-term use of high-risk medication 04/02/2018   Dizziness 06/26/2017   Fatigue 04/30/2016   Insomnia 04/30/2016   Sinusitis 01/31/2016   Hyperlipidemia 01/31/2016   Ear pain 05/09/2015   Nephrolithiasis 04/18/2015   Anemia    Abnormal nuclear stress test    OBESITY, UNSPECIFIED 03/28/2009   Essential hypertension 03/28/2009   PALPITATIONS 03/28/2009   SNORING 03/28/2009   PCP:  Jolinda Norene CHRISTELLA, DO Pharmacy:  [facility] [phone] - [place], [place] - [address] [place] [address] Phone: [phone] Fax: [phone]  [name] [address] [place] [address] Phone: [phone] Fax: [phone]     Social Drivers of Health (SDOH) Social History: SDOH Screenings   Food Insecurity: No Food Insecurity (05/15/2024)  Housing: Low Risk  (05/15/2024)  Transportation Needs: No Transportation Needs (05/15/2024)  Utilities: Not At Risk (05/15/2024)  Alcohol Screen: Low Risk  (11/25/2023)  Depression (PHQ2-9): Low Risk  (05/14/2024)  Financial Resource Strain: Low Risk  (11/25/2023)  Physical Activity: Sufficiently Active (11/25/2023)  Social Connections:  Moderately Isolated (05/15/2024)  Stress: No Stress Concern Present (11/25/2023)  Tobacco Use: Medium Risk (05/18/2024)  Health Literacy: Adequate Health Literacy (11/25/2023)   SDOH Interventions:     Readmission Risk Interventions     No data to display

## 2024-05-18 NOTE — Progress Notes (Signed)
 PROGRESS NOTE    Travis Padilla  FMW:[id number] DOB: 01-29-1946 DOA: 05/15/2024 PCP: Jolinda Norene CHRISTELLA, DO    Brief Narrative:  Travis Padilla is a 78 y.o. male with medical history significant for prostate cancer, stage IV lung cancer, COPD, OSA with CPAP intolerance, CAD, hypothyroidism, and anxiety who presents with worsening nausea, abdominal discomfort, and constipation.     Patient reports that his symptoms began several days ago and have continued to worsen despite use of enemas.  He is not passing flatus.  He denies any recent chest pain or shortness of breath.     He was evaluated in an outpatient clinic for this and had labs and CT performed.  CT was concerning for high-grade SBO.   Assessment and Plan:  SBO  -OR on 9/8 - Appreciate general surgery consultation - Ambulate   AKI  - resolved   COPD  - Not in exacerbation  - Continue ICS-LAMA-LABA      CAD  - No recent angina      Lung cancer  - Stage IV, followed by Dr. Sherrod, currently under observation      Hypertension  - Treat as-needed only for now    Hypothyroidism  - Use IV levothyroxine  if remains NPO for several days    Obesity Estimated body mass index is 31.84 kg/m as calculated from the following:   Height as of this encounter: 6' 2 (1.88 m).   Weight as of this encounter: 112.5 kg.    DVT prophylaxis: SCDs Start: 05/15/24 2108    Code Status: Full Code   Disposition Plan:  Level of care: Med-Surg Status is: Inpatient     Consultants:  General surgery  Subjective: No nausea  Objective: Vitals:   05/17/24 2023 05/17/24 2030 05/18/24 0452 05/18/24 0915  BP: 122/74  125/80 (!) 157/81  Pulse: 96  95 94  Resp: 16  15 16   Temp: 98.7 F (37.1 C)  98.4 F (36.9 C) 98 F (36.7 C)  TempSrc: Oral  Oral Oral  SpO2: 96% 98% 96% 98%  Weight:      Height:        Intake/Output Summary (Last 24 hours) at 05/18/2024 1137 Last data filed at 05/18/2024 1130 Gross per 24 hour  Intake  2025 ml  Output 2010 ml  Net 15 ml   Filed Weights   05/15/24 2057 05/16/24 0500 05/17/24 0500  Weight: 111.4 kg 112.9 kg 112.5 kg    Examination:   General: Appearance:    Obese male in no acute distress     Lungs:     Clear to auscultation bilaterally  Heart:    Normal heart rate.   MS:   All extremities are intact.    Neurologic:   Awake, alert       Data Reviewed: I have personally reviewed following labs and imaging studies  CBC: Recent Labs  Lab 05/15/24 1810 05/16/24 0509 05/17/24 0445 05/18/24 0415  WBC 12.0* 9.7 7.9 6.8  HGB 15.0 13.3 12.0* 12.3*  HCT 43.3 38.5* 35.6* 37.8*  MCV 97.1 99.2 101.4* 101.6*  PLT 221 170 153 157   Basic Metabolic Panel: Recent Labs  Lab 05/14/24 1522 05/15/24 1810 05/16/24 0509 05/17/24 0445 05/18/24 0415  NA 142 134* 133* 141 145  K 4.4 4.3 4.0 3.5 4.1  CL 90* 88* 93* 101 106  CO2 26 27 27 24 22   GLUCOSE 157* 112* 103* 75 75  BUN 33* 59* 54* 34* 22  CREATININE  1.99* 2.84* 2.26* 1.38* 1.11  CALCIUM 10.6* 9.8 8.7* 8.6* 8.9  MG  --   --   --   --  2.5*   GFR: Estimated Creatinine Clearance: 75.5 mL/min (by C-G formula based on SCr of 1.11 mg/dL). Liver Function Tests: Recent Labs  Lab 05/14/24 1522 05/15/24 1810 05/16/24 0509  AST 22 24 21   ALT 13 12 11   ALKPHOS 72 64 56  BILITOT 1.1 1.6* 1.3*  PROT 8.0 7.9 6.7  ALBUMIN  4.8 4.5 3.9   Recent Labs  Lab 05/15/24 1810  LIPASE 51   No results for input(s): AMMONIA in the last 168 hours. Coagulation Profile: No results for input(s): INR, PROTIME in the last 168 hours. Cardiac Enzymes: No results for input(s): CKTOTAL, CKMB, CKMBINDEX, TROPONINI in the last 168 hours. BNP (last 3 results) No results for input(s): PROBNP in the last 8760 hours. HbA1C: No results for input(s): HGBA1C in the last 72 hours. CBG: No results for input(s): GLUCAP in the last 168 hours. Lipid Profile: No results for input(s): CHOL, HDL, LDLCALC,  TRIG, CHOLHDL, LDLDIRECT in the last 72 hours. Thyroid  Function Tests: No results for input(s): TSH, T4TOTAL, FREET4, T3FREE, THYROIDAB in the last 72 hours. Anemia Panel: No results for input(s): VITAMINB12, FOLATE, FERRITIN, TIBC, IRON , RETICCTPCT in the last 72 hours. Sepsis Labs: No results for input(s): PROCALCITON, LATICACIDVEN in the last 168 hours.  No results found for this or any previous visit (from the past 240 hours).       Radiology Studies: DG Abd 2 Views Result Date: 05/17/2024 CLINICAL DATA:  Small-bowel obstruction EXAM: ABDOMEN - 2 VIEW COMPARISON:  Abdominal radiograph dated 05/16/2024 FINDINGS: Gastric/enteric tube tip projects over the stomach. Similar gas-filled mildly dilated bowel loops remain in the left hemiabdomen. Prostate fiducials project over the lower midline pelvis. IMPRESSION: Similar gas-filled mildly dilated bowel loops remain in the left hemiabdomen. Electronically Signed   By: Limin  Xu M.D.   On: 05/17/2024 10:50        Scheduled Meds:  [MAR Hold] budesonide -glycopyrrolate -formoterol   2 puff Inhalation BID   [MAR Hold] sodium chloride  flush  3 mL Intravenous Q12H   Continuous Infusions:  sodium chloride  100 mL/hr at 05/18/24 0616   lactated ringers  10 mL/hr at 05/18/24 1037     LOS: 3 days    Time spent: 45 minutes spent on chart review, discussion with nursing staff, consultants, updating family and interview/physical exam; more than 50% of that time was spent in counseling and/or coordination of care.    Harlene RAYMOND Bowl, DO [facility] Available via Epic secure chat 7am-7pm After these hours, please refer to coverage provider listed on [email or web address] 05/18/2024, 11:37 AM

## 2024-05-19 ENCOUNTER — Encounter (HOSPITAL_COMMUNITY): Payer: Self-pay | Admitting: General Surgery

## 2024-05-19 ENCOUNTER — Inpatient Hospital Stay (HOSPITAL_COMMUNITY)

## 2024-05-19 DIAGNOSIS — K56609 Unspecified intestinal obstruction, unspecified as to partial versus complete obstruction: Secondary | ICD-10-CM | POA: Diagnosis not present

## 2024-05-19 LAB — BASIC METABOLIC PANEL WITH GFR
Anion gap: 17 — ABNORMAL HIGH (ref 5–15)
BUN: 18 mg/dL (ref 8–23)
CO2: 20 mmol/L — ABNORMAL LOW (ref 22–32)
Calcium: 8.9 mg/dL (ref 8.9–10.3)
Chloride: 106 mmol/L (ref 98–111)
Creatinine, Ser: 1.05 mg/dL (ref 0.61–1.24)
GFR, Estimated: >60 mL/min (ref >60)
Glucose, Bld: 117 mg/dL — ABNORMAL HIGH (ref 70–99)
Potassium: 4.5 mmol/L (ref 3.5–5.1)
Sodium: 143 mmol/L (ref 135–145)

## 2024-05-19 LAB — CBC
HCT: 36.7 % — ABNORMAL LOW (ref 39.0–52.0)
Hemoglobin: 12 g/dL — ABNORMAL LOW (ref 13.0–17.0)
MCH: 33.9 pg (ref 26.0–34.0)
MCHC: 32.7 g/dL (ref 30.0–36.0)
MCV: 103.7 fL — ABNORMAL HIGH (ref 80.0–100.0)
Platelets: 164 K/uL (ref 150–400)
RBC: 3.54 MIL/uL — ABNORMAL LOW (ref 4.22–5.81)
RDW: 11.6 % (ref 11.5–15.5)
WBC: 7.2 K/uL (ref 4.0–10.5)
nRBC: 0 % (ref 0.0–0.2)

## 2024-05-19 MED ORDER — LEVOTHYROXINE SODIUM 100 MCG/5ML IV SOLN: 50.0000 ug | Freq: Every day | INTRAVENOUS | Status: DC

## 2024-05-19 MED ORDER — PANTOPRAZOLE SODIUM 40 MG IV SOLR
40.0000 mg | Freq: Two times a day (BID) | INTRAVENOUS | Status: DC
  Administered 2024-05-19 – 2024-05-21 (×5): 40 mg via INTRAVENOUS
  Filled 2024-05-19 (×5): qty 10

## 2024-05-19 NOTE — Progress Notes (Addendum)
 1 Day Post-Op  Subjective: CC: Still with left sided abdominal pain in addition to soreness around his incisions on the left side. He is with some nausea and feels bloated. NGT in place w/ bilious output - 750cc/24 hours. No flatus or BM. Voiding. Mobilizing.   Afebrile. No tachycardia or hypotension. WBC wnl. Hgb stable. Cr wnl. K 4.5. Xray with dilated small bowel with air in colon.   Objective: Vital signs in last 24 hours: Temp:  [97.5 F (36.4 C)-99.2 F (37.3 C)] 97.5 F (36.4 C) (09/09 0738) Pulse Rate:  [83-96] 88 (09/09 0738) Resp:  [15-19] 16 (09/09 0738) BP: (120-163)/(62-92) 127/77 (09/09 0738) SpO2:  [94 %-100 %] 99 % (09/09 0752) Weight:  [109.5 kg] 109.5 kg (09/09 0545) Last BM Date :  (more than ten days per patient)  Intake/Output from previous day: 09/08 0701 - 09/09 0700 In: 2078.6 [I.V.:2078.6] Out: 1810 [Urine:1050; Emesis/NG output:750; Blood:10] Intake/Output this shift: No intake/output data recorded.  PE: Gen:  Alert, NAD, pleasant Pulm:  Rate and effort normal Abd: Soft, at least mild distension, left sided ttp and appropriately tender around laparoscopic incisions, no rigidity or guarding and otherwise NT. Incisions with glue intact appears well and are without drainage, bleeding, or signs of infection. NGT on LIWS with bilious output in cannister.   Lab Results:  Recent Labs    05/18/24 0415 05/19/24 0432  WBC 6.8 7.2  HGB 12.3* 12.0*  HCT 37.8* 36.7*  PLT 157 164   BMET Recent Labs    05/18/24 0415 05/19/24 0432  NA 145 143  K 4.1 4.5  CL 106 106  CO2 22 20*  GLUCOSE 75 117*  BUN 22 18  CREATININE 1.11 1.05  CALCIUM 8.9 8.9   PT/INR No results for input(s): LABPROT, INR in the last 72 hours. CMP     Component Value Date/Time   NA 143 05/19/2024 0432   NA 142 05/14/2024 1522   K 4.5 05/19/2024 0432   CL 106 05/19/2024 0432   CO2 20 (L) 05/19/2024 0432   GLUCOSE 117 (H) 05/19/2024 0432   BUN 18 05/19/2024 0432    BUN 33 (H) 05/14/2024 1522   CREATININE 1.05 05/19/2024 0432   CREATININE 0.96 11/22/2023 1026   CALCIUM 8.9 05/19/2024 0432   PROT 6.7 05/16/2024 0509   PROT 8.0 05/14/2024 1522   ALBUMIN  3.9 05/16/2024 0509   ALBUMIN  4.8 05/14/2024 1522   AST 21 05/16/2024 0509   AST 21 11/22/2023 1026   ALT 11 05/16/2024 0509   ALT 13 11/22/2023 1026   ALKPHOS 56 05/16/2024 0509   BILITOT 1.3 (H) 05/16/2024 0509   BILITOT 1.1 05/14/2024 1522   BILITOT 0.4 11/22/2023 1026   GFRNONAA >60 05/19/2024 0432   GFRNONAA >60 11/22/2023 1026   GFRAA >60 06/13/2020 0826   Lipase     Component Value Date/Time   LIPASE 51 05/15/2024 1810    Studies/Results: DG Abd Portable 1V Result Date: 05/19/2024 CLINICAL DATA:  [id number] SBO (small bowel obstruction) (HCC) [id number] EXAM: PORTABLE ABDOMEN - 1 VIEW COMPARISON:  May 17, 2024 FINDINGS: Esophagogastric tube terminates in the region of the stomach. Couple of mildly dilated segments of small bowel persist in the left hemiabdomen. Gas throughout the colon.No pneumoperitoneum. No organomegaly or radiopaque calculi. Pelvic phleboliths. No acute fracture or destructive lesion. Multilevel thoracic osteophytosis. IMPRESSION: Similar dilation of a couple of gas-filled segments of small bowel in the left hemiabdomen. Gas throughout the colon. Well-positioned esophagogastric tube. Electronically Signed  By: Rogelia Myers M.D.   On: 05/19/2024 09:00    Anti-infectives: Anti-infectives (From admission, onward)    Start     Dose/Rate Route Frequency Ordered Stop   05/18/24 0830  ceFAZolin  (ANCEF ) IVPB 2g/100 mL premix        2 g 200 mL/hr over 30 Minutes Intravenous On call to O.R. 05/18/24 0743 05/18/24 1109        Assessment/Plan POD 1 s/p laparoscopic lysis of adhesions by Dr. Stevie on 05/18/24 for SBO - Intra-op findings: small bilateral inguinal hernias, adhesions of sigmoid colon to the left abdomen wall, dilation of the mid jejunum with some  hemorrhagic changes. no definite evidence of mechanical bowel obstruction  - Cont NPO, NGT to LIWS. AROBF - Keep K >=4, Phos >= 3, Mg >= 2 and mobilize for bowel function. Okay to clamp NGT for mobilization - We will follow with you  FEN - NPO, NGT to LIWS, IVF per TRH. PPI for blood tinged NGT output.  VTE - SCDs, okay for chem ppx from a general surgery standpoint ID - Ancef  peri-op. None currently.  Foley - None, spont void   LOS: 4 days    Ozell CHRISTELLA Shaper, [facility] Surgery 05/19/2024, 10:01 AM Please see Amion for pager number during day hours 7:00am-4:30pm

## 2024-05-19 NOTE — Progress Notes (Signed)
 PROGRESS NOTE    Travis Padilla  FMW:[id number] DOB: [birth year] DOA: 05/15/2024 PCP: Jolinda Norene CHRISTELLA, DO    Brief Narrative:  Travis Padilla is a 78 y.o. male with medical history significant for prostate cancer, stage IV lung cancer, COPD, OSA with CPAP intolerance, CAD, hypothyroidism, and anxiety who presents with worsening nausea, abdominal discomfort, and constipation.     Patient reports that his symptoms began several days ago and have continued to worsen despite use of enemas.  He is not passing flatus.  He denies any recent chest pain or shortness of breath.     He was evaluated in an outpatient clinic for this and had labs and CT performed.  CT was concerning for high-grade SBO.  Did not improve with conservative measures so went to the OR on 9/8.     Assessment and Plan:  SBO  -OR on 9/8 - Appreciate general surgery consultation - Ambulate   AKI  - resolved -gentle IVF while NPO   COPD  - Not in exacerbation  - Continue ICS-LAMA-LABA      CAD  - No recent angina      Lung cancer  - Stage IV, followed by Dr. Sherrod, currently under observation      Hypertension  - Treat as-needed only for now    Hypothyroidism  - Use IV levothyroxine  as NPO  Obesity Estimated body mass index is 30.99 kg/m as calculated from the following:   Height as of this encounter: 6' 2 (1.88 m).   Weight as of this encounter: 109.5 kg.    DVT prophylaxis: SCDs Start: 05/15/24 2108    Code Status: Full Code   Disposition Plan:  Level of care: Med-Surg Status is: Inpatient     Consultants:  General surgery  Subjective: No BM or flatus  Objective: Vitals:   05/19/24 0545 05/19/24 0738 05/19/24 0751 05/19/24 0752  BP: 124/75 127/77    Pulse: 84 88    Resp:  16    Temp: 98 F (36.7 C) (!) 97.5 F (36.4 C)    TempSrc: Oral     SpO2: 98% 99% 99% 99%  Weight: 109.5 kg     Height:        Intake/Output Summary (Last 24 hours) at 05/19/2024 1052 Last data filed  at 05/19/2024 0700 Gross per 24 hour  Intake 2078.63 ml  Output 1510 ml  Net 568.63 ml   Filed Weights   05/16/24 0500 05/17/24 0500 05/19/24 0545  Weight: 112.9 kg 112.5 kg 109.5 kg    Examination:   General: Appearance:    Obese male in no acute distress   NG tube in place  Lungs:     Clear to auscultation bilaterally  Heart:    Normal heart rate.   MS:   All extremities are intact.    Neurologic:   Awake, alert       Data Reviewed: I have personally reviewed following labs and imaging studies  CBC: Recent Labs  Lab 05/15/24 1810 05/16/24 0509 05/17/24 0445 05/18/24 0415 05/19/24 0432  WBC 12.0* 9.7 7.9 6.8 7.2  HGB 15.0 13.3 12.0* 12.3* 12.0*  HCT 43.3 38.5* 35.6* 37.8* 36.7*  MCV 97.1 99.2 101.4* 101.6* 103.7*  PLT 221 170 153 157 164   Basic Metabolic Panel: Recent Labs  Lab 05/15/24 1810 05/16/24 0509 05/17/24 0445 05/18/24 0415 05/19/24 0432  NA 134* 133* 141 145 143  K 4.3 4.0 3.5 4.1 4.5  CL 88* 93* 101 106  106  CO2 27 27 24 22  20*  GLUCOSE 112* 103* 75 75 117*  BUN 59* 54* 34* 22 18  CREATININE 2.84* 2.26* 1.38* 1.11 1.05  CALCIUM 9.8 8.7* 8.6* 8.9 8.9  MG  --   --   --  2.5*  --    GFR: Estimated Creatinine Clearance: 78.8 mL/min (by C-G formula based on SCr of 1.05 mg/dL). Liver Function Tests: Recent Labs  Lab 05/14/24 1522 05/15/24 1810 05/16/24 0509  AST 22 24 21   ALT 13 12 11   ALKPHOS 72 64 56  BILITOT 1.1 1.6* 1.3*  PROT 8.0 7.9 6.7  ALBUMIN  4.8 4.5 3.9   Recent Labs  Lab 05/15/24 1810  LIPASE 51   No results for input(s): AMMONIA in the last 168 hours. Coagulation Profile: No results for input(s): INR, PROTIME in the last 168 hours. Cardiac Enzymes: No results for input(s): CKTOTAL, CKMB, CKMBINDEX, TROPONINI in the last 168 hours. BNP (last 3 results) No results for input(s): PROBNP in the last 8760 hours. HbA1C: No results for input(s): HGBA1C in the last 72 hours. CBG: No results for  input(s): GLUCAP in the last 168 hours. Lipid Profile: No results for input(s): CHOL, HDL, LDLCALC, TRIG, CHOLHDL, LDLDIRECT in the last 72 hours. Thyroid  Function Tests: No results for input(s): TSH, T4TOTAL, FREET4, T3FREE, THYROIDAB in the last 72 hours. Anemia Panel: No results for input(s): VITAMINB12, FOLATE, FERRITIN, TIBC, IRON , RETICCTPCT in the last 72 hours. Sepsis Labs: No results for input(s): PROCALCITON, LATICACIDVEN in the last 168 hours.  No results found for this or any previous visit (from the past 240 hours).       Radiology Studies: DG Abd Portable 1V Result Date: 05/19/2024 CLINICAL DATA:  881154 SBO (small bowel obstruction) (HCC) [id number] EXAM: PORTABLE ABDOMEN - 1 VIEW COMPARISON:  May 17, 2024 FINDINGS: Esophagogastric tube terminates in the region of the stomach. Couple of mildly dilated segments of small bowel persist in the left hemiabdomen. Gas throughout the colon.No pneumoperitoneum. No organomegaly or radiopaque calculi. Pelvic phleboliths. No acute fracture or destructive lesion. Multilevel thoracic osteophytosis. IMPRESSION: Similar dilation of a couple of gas-filled segments of small bowel in the left hemiabdomen. Gas throughout the colon. Well-positioned esophagogastric tube. Electronically Signed   By: Rogelia Myers M.D.   On: 05/19/2024 09:00        Scheduled Meds:  budesonide -glycopyrrolate -formoterol   2 puff Inhalation BID   Influenza vac split trivalent PF  0.5 mL Intramuscular Tomorrow-1000   pantoprazole  (PROTONIX ) IV  40 mg Intravenous Q12H   sodium chloride  flush  3 mL Intravenous Q12H   Continuous Infusions:  sodium chloride  75 mL/hr at 05/19/24 1037     LOS: 4 days    Time spent: 45 minutes spent on chart review, discussion with nursing staff, consultants, updating family and interview/physical exam; more than 50% of that time was spent in counseling and/or coordination of  care.    Harlene RAYMOND Bowl, DO [facility] Available via Epic secure chat 7am-7pm After these hours, please refer to coverage provider listed on [email or web address] 05/19/2024, 10:52 AM

## 2024-05-20 DIAGNOSIS — K56609 Unspecified intestinal obstruction, unspecified as to partial versus complete obstruction: Secondary | ICD-10-CM | POA: Diagnosis not present

## 2024-05-20 LAB — CBC
HCT: 35.7 % — ABNORMAL LOW (ref 39.0–52.0)
Hemoglobin: 11.4 g/dL — ABNORMAL LOW (ref 13.0–17.0)
MCH: 32.6 pg (ref 26.0–34.0)
MCHC: 31.9 g/dL (ref 30.0–36.0)
MCV: 102 fL — ABNORMAL HIGH (ref 80.0–100.0)
Platelets: 164 K/uL (ref 150–400)
RBC: 3.5 MIL/uL — ABNORMAL LOW (ref 4.22–5.81)
RDW: 11.7 % (ref 11.5–15.5)
WBC: 5.2 K/uL (ref 4.0–10.5)
nRBC: 0 % (ref 0.0–0.2)

## 2024-05-20 LAB — BASIC METABOLIC PANEL WITH GFR
Anion gap: 15 (ref 5–15)
BUN: 17 mg/dL (ref 8–23)
CO2: 22 mmol/L (ref 22–32)
Calcium: 9 mg/dL (ref 8.9–10.3)
Chloride: 109 mmol/L (ref 98–111)
Creatinine, Ser: 1.02 mg/dL (ref 0.61–1.24)
GFR, Estimated: >60 mL/min (ref >60)
Glucose, Bld: 77 mg/dL (ref 70–99)
Potassium: 3.4 mmol/L — ABNORMAL LOW (ref 3.5–5.1)
Sodium: 146 mmol/L — ABNORMAL HIGH (ref 135–145)

## 2024-05-20 LAB — GLUCOSE, CAPILLARY
Glucose-Capillary: 102 mg/dL — ABNORMAL HIGH (ref 70–99)
Glucose-Capillary: 102 mg/dL — ABNORMAL HIGH (ref 70–99)
Glucose-Capillary: 128 mg/dL — ABNORMAL HIGH (ref 70–99)

## 2024-05-20 MED ORDER — POTASSIUM CHLORIDE 10 MEQ/100ML IV SOLN
10.0000 meq | INTRAVENOUS | Status: AC
Start: 2024-05-20 — End: 2024-05-20
  Administered 2024-05-20 (×3): 10 meq via INTRAVENOUS
  Filled 2024-05-20 (×3): qty 100

## 2024-05-20 MED ORDER — CARVEDILOL 3.125 MG PO TABS
3.1250 mg | ORAL_TABLET | Freq: Two times a day (BID) | ORAL | Status: DC
  Administered 2024-05-21: 3.125 mg via ORAL
  Filled 2024-05-20: qty 1

## 2024-05-20 MED ORDER — SODIUM CHLORIDE 0.9 % IV SOLN: INTRAVENOUS | Status: DC

## 2024-05-20 MED ORDER — HEPARIN SODIUM (PORCINE) 5000 UNIT/ML IJ SOLN
5000.0000 [IU] | Freq: Three times a day (TID) | INTRAMUSCULAR | Status: DC
  Administered 2024-05-20 – 2024-05-21 (×2): 5000 [IU] via SUBCUTANEOUS
  Filled 2024-05-20 (×2): qty 1

## 2024-05-20 NOTE — Progress Notes (Signed)
 PROGRESS NOTE    Travis Padilla  FMW:[id number] DOB: June 06, 1946 DOA: 05/15/2024 PCP: Jolinda Norene CHRISTELLA, DO    Brief Narrative:  Travis Padilla is a 78 y.o. male with medical history significant for prostate cancer, stage IV lung cancer, COPD, OSA with CPAP intolerance, CAD, hypothyroidism, and anxiety who presents with worsening nausea, abdominal discomfort, and constipation.     Patient reports that his symptoms began several days ago and have continued to worsen despite use of enemas.  He is not passing flatus.  He denies any recent chest pain or shortness of breath.     He was evaluated in an outpatient clinic for this and had labs and CT performed.  CT was concerning for high-grade SBO.  Did not improve with conservative measures so went to the OR on 9/8.     Assessment and Plan:  SBO- s/p laparoscopic lysis of adhesions by Dr. Stevie on 05/18/24 for SBO  - Appreciate general surgery consultation -- Has flatus  - Had small BM No fever  Or chills  -Continue Protonix  for stress ulcer prophylaxis--May switch from IV to oral Protonix  on 05/21/2024 tolerating oral intake well - Per general surgery NG tube clamped tolerating some clears   AKI  - resolved -gentle IVF until oral intake more alert   COPD  - Not in exacerbation  - Continue ICS-LAMA-LABA      CAD  - No recent angina   -Restart PTA Coreg  -Consider restarting PTA Plavix  on 05/21/2024 if okay with general surgery    Lung cancer  - Stage IV, followed by Dr. Sherrod, currently under observation      Hypertension  - Restart Coreg    Hypothyroidism  - Use IV levothyroxine   - Okay to switch to p.o. levothyroxine  on 05/21/2024 if tolerating oral intake well  Obesity Estimated body mass index is 30.66 kg/m as calculated from the following:   Height as of this encounter: 6' 2 (1.88 m).   Weight as of this encounter: 108.3 kg.    DVT prophylaxis: heparin  injection 5,000 Units Start: 05/20/24 2200 SCDs Start:  05/15/24 2108    Code Status: Full Code   Disposition Plan:  Level of care: Med-Surg Status is: Inpatient   Consultants:  General surgery  Subjective:  - Has flatus  - Had small BM No fever  Or chills  - Per general surgery NG tube clamped tolerating some clears   Objective: Vitals:   05/20/24 0554 05/20/24 0740 05/20/24 1144 05/20/24 1301  BP: 128/87   119/74  Pulse: 95   (!) 103  Resp: 18   18  Temp: (!) 97.4 F (36.3 C)   97.7 F (36.5 C)  TempSrc: Oral     SpO2: 99% 96%  97%  Weight:   108.3 kg   Height:        Intake/Output Summary (Last 24 hours) at 05/20/2024 1820 Last data filed at 05/20/2024 1800 Gross per 24 hour  Intake 726.4 ml  Output 850 ml  Net -123.6 ml   Filed Weights   05/17/24 0500 05/19/24 0545 05/20/24 1144  Weight: 112.5 kg 109.5 kg 108.3 kg    Physical Exam  Gen:- Awake Alert, in no acute distress  HEENT:- [place].AT, No sclera icterus Nose- NG tube clamped Neck-Supple Neck,No JVD,.  Lungs-  CTAB , fair air movement bilaterally  CV- S1, S2 normal, RRR Abd-  +ve B.Sounds, Abd Soft, No significant tenderness, postop wound intact Extremity/Skin:- No  edema,   good pedal pulses  Psych-affect is appropriate, oriented x3 Neuro-no new focal deficits, no tremors   Data Reviewed: I have personally reviewed following labs and imaging studies  CBC: Recent Labs  Lab 05/16/24 0509 05/17/24 0445 05/18/24 0415 05/19/24 0432 05/20/24 0444  WBC 9.7 7.9 6.8 7.2 5.2  HGB 13.3 12.0* 12.3* 12.0* 11.4*  HCT 38.5* 35.6* 37.8* 36.7* 35.7*  MCV 99.2 101.4* 101.6* 103.7* 102.0*  PLT 170 153 157 164 164   Basic Metabolic Panel: Recent Labs  Lab 05/16/24 0509 05/17/24 0445 05/18/24 0415 05/19/24 0432 05/20/24 0444  NA 133* 141 145 143 146*  K 4.0 3.5 4.1 4.5 3.4*  CL 93* 101 106 106 109  CO2 27 24 22  20* 22  GLUCOSE 103* 75 75 117* 77  BUN 54* 34* 22 18 17   CREATININE 2.26* 1.38* 1.11 1.05 1.02  CALCIUM 8.7* 8.6* 8.9 8.9 9.0  MG  --   --   2.5*  --   --    GFR: Estimated Creatinine Clearance: 80.7 mL/min (by C-G formula based on SCr of 1.02 mg/dL). Liver Function Tests: Recent Labs  Lab 05/14/24 1522 05/15/24 1810 05/16/24 0509  AST 22 24 21   ALT 13 12 11   ALKPHOS 72 64 56  BILITOT 1.1 1.6* 1.3*  PROT 8.0 7.9 6.7  ALBUMIN  4.8 4.5 3.9   Recent Labs  Lab 05/15/24 1810  LIPASE 51   CBG: Recent Labs  Lab 05/20/24 1122 05/20/24 1723  GLUCAP 102* 128*    Radiology Studies: DG Abd Portable 1V Result Date: 05/19/2024 CLINICAL DATA:  881154 SBO (small bowel obstruction) (HCC) [id number] EXAM: PORTABLE ABDOMEN - 1 VIEW COMPARISON:  May 17, 2024 FINDINGS: Esophagogastric tube terminates in the region of the stomach. Couple of mildly dilated segments of small bowel persist in the left hemiabdomen. Gas throughout the colon.No pneumoperitoneum. No organomegaly or radiopaque calculi. Pelvic phleboliths. No acute fracture or destructive lesion. Multilevel thoracic osteophytosis. IMPRESSION: Similar dilation of a couple of gas-filled segments of small bowel in the left hemiabdomen. Gas throughout the colon. Well-positioned esophagogastric tube. Electronically Signed   By: Rogelia Myers M.D.   On: 05/19/2024 09:00   Scheduled Meds:  budesonide -glycopyrrolate -formoterol   2 puff Inhalation BID   heparin  injection (subcutaneous)  5,000 Units Subcutaneous Q8H   Influenza vac split trivalent PF  0.5 mL Intramuscular Tomorrow-1000   [START ON 05/26/2024] levothyroxine   50 mcg Intravenous Daily   pantoprazole  (PROTONIX ) IV  40 mg Intravenous Q12H   sodium chloride  flush  3 mL Intravenous Q12H   Continuous Infusions:  sodium chloride  50 mL/hr at 05/20/24 0929    LOS: 5 days   Rendall Carwin, MD [facility] Available via Epic secure chat 7am-7pm After these hours, please refer to coverage provider listed on [email or web address] 05/20/2024, 6:20 PM

## 2024-05-20 NOTE — Progress Notes (Signed)
 2 Days Post-Op  Subjective: CC: Feeling better today. Prior abdominal pain has resolved and he now only reports incisional abdominal pain that is worse with movement. No longer nauseated or feeling bloated. Passing flatus. 2 small grainy bm's over the last 24 hours.  NGT output down at 100cc/24 hours and thinner. Voiding. Mobilizing.  Afebrile. WBC wnl.   Objective: Vital signs in last 24 hours: Temp:  [97.4 F (36.3 C)-98.1 F (36.7 C)] 97.4 F (36.3 C) (09/10 0554) Pulse Rate:  [87-95] 95 (09/10 0554) Resp:  [18] 18 (09/10 0554) BP: (128-144)/(86-87) 128/87 (09/10 0554) SpO2:  [95 %-99 %] 96 % (09/10 0740) Last BM Date : 05/19/24  Intake/Output from previous day: 09/09 0701 - 09/10 0700 In: 729.6 [I.V.:729.6] Out: 850 [Urine:750; Emesis/NG output:100] Intake/Output this shift: No intake/output data recorded.  PE: Gen:  Alert, NAD, pleasant Pulm:  Rate and effort normal Abd: Soft, less distended, appropriately tender around laparoscopic incisions, no rigidity or guarding and otherwise NT. Incisions with glue intact appears well and are without drainage, bleeding, or signs of infection. NGT on LIWS with more thin red output in cannister.   Lab Results:  Recent Labs    05/19/24 0432 05/20/24 0444  WBC 7.2 5.2  HGB 12.0* 11.4*  HCT 36.7* 35.7*  PLT 164 164   BMET Recent Labs    05/19/24 0432 05/20/24 0444  NA 143 146*  K 4.5 3.4*  CL 106 109  CO2 20* 22  GLUCOSE 117* 77  BUN 18 17  CREATININE 1.05 1.02  CALCIUM 8.9 9.0   PT/INR No results for input(s): LABPROT, INR in the last 72 hours. CMP     Component Value Date/Time   NA 146 (H) 05/20/2024 0444   NA 142 05/14/2024 1522   K 3.4 (L) 05/20/2024 0444   CL 109 05/20/2024 0444   CO2 22 05/20/2024 0444   GLUCOSE 77 05/20/2024 0444   BUN 17 05/20/2024 0444   BUN 33 (H) 05/14/2024 1522   CREATININE 1.02 05/20/2024 0444   CREATININE 0.96 11/22/2023 1026   CALCIUM 9.0 05/20/2024 0444   PROT 6.7  05/16/2024 0509   PROT 8.0 05/14/2024 1522   ALBUMIN  3.9 05/16/2024 0509   ALBUMIN  4.8 05/14/2024 1522   AST 21 05/16/2024 0509   AST 21 11/22/2023 1026   ALT 11 05/16/2024 0509   ALT 13 11/22/2023 1026   ALKPHOS 56 05/16/2024 0509   BILITOT 1.3 (H) 05/16/2024 0509   BILITOT 1.1 05/14/2024 1522   BILITOT 0.4 11/22/2023 1026   GFRNONAA >60 05/20/2024 0444   GFRNONAA >60 11/22/2023 1026   GFRAA >60 06/13/2020 0826   Lipase     Component Value Date/Time   LIPASE 51 05/15/2024 1810    Studies/Results: DG Abd Portable 1V Result Date: 05/19/2024 CLINICAL DATA:  [id number] SBO (small bowel obstruction) (HCC) [id number] EXAM: PORTABLE ABDOMEN - 1 VIEW COMPARISON:  May 17, 2024 FINDINGS: Esophagogastric tube terminates in the region of the stomach. Couple of mildly dilated segments of small bowel persist in the left hemiabdomen. Gas throughout the colon.No pneumoperitoneum. No organomegaly or radiopaque calculi. Pelvic phleboliths. No acute fracture or destructive lesion. Multilevel thoracic osteophytosis. IMPRESSION: Similar dilation of a couple of gas-filled segments of small bowel in the left hemiabdomen. Gas throughout the colon. Well-positioned esophagogastric tube. Electronically Signed   By: Rogelia Myers M.D.   On: 05/19/2024 09:00    Anti-infectives: Anti-infectives (From admission, onward)    Start     Dose/Rate Route  Frequency Ordered Stop   05/18/24 0830  ceFAZolin  (ANCEF ) IVPB 2g/100 mL premix        2 g 200 mL/hr over 30 Minutes Intravenous On call to O.R. 05/18/24 0743 05/18/24 1109        Assessment/Plan POD 2 s/p laparoscopic lysis of adhesions by Dr. Stevie on 05/18/24 for SBO - Intra-op findings: small bilateral inguinal hernias, adhesions of sigmoid colon to the left abdomen wall, dilation of the mid jejunum with some hemorrhagic changes. no definite evidence of mechanical bowel obstruction  - Clamp NGT, CLD. Possible NGT removal later today.  - Keep K >=4, Phos  >= 3, Mg >= 2 and mobilize for bowel function.  - We will follow with you  FEN - NGT clamped, CLD, IVF per TRH. PPI for blood tinged NGT output. Replace K (hypokalemia, K 3.4) VTE - SCDs, okay for chem ppx from a general surgery standpoint ID - Ancef  peri-op. None currently.  Foley - None, spont void   LOS: 5 days    Ozell CHRISTELLA Shaper, [facility] Surgery 05/20/2024, 9:06 AM Please see Amion for pager number during day hours 7:00am-4:30pm

## 2024-05-21 DIAGNOSIS — K56609 Unspecified intestinal obstruction, unspecified as to partial versus complete obstruction: Secondary | ICD-10-CM | POA: Diagnosis not present

## 2024-05-21 LAB — GLUCOSE, CAPILLARY
Glucose-Capillary: 108 mg/dL — ABNORMAL HIGH (ref 70–99)
Glucose-Capillary: 142 mg/dL — ABNORMAL HIGH (ref 70–99)

## 2024-05-21 LAB — BASIC METABOLIC PANEL WITH GFR
Anion gap: 12 (ref 5–15)
BUN: 12 mg/dL (ref 8–23)
CO2: 23 mmol/L (ref 22–32)
Calcium: 9.4 mg/dL (ref 8.9–10.3)
Chloride: 107 mmol/L (ref 98–111)
Creatinine, Ser: 1.09 mg/dL (ref 0.61–1.24)
GFR, Estimated: >60 mL/min (ref >60)
Glucose, Bld: 118 mg/dL — ABNORMAL HIGH (ref 70–99)
Potassium: 4.5 mmol/L (ref 3.5–5.1)
Sodium: 142 mmol/L (ref 135–145)

## 2024-05-21 LAB — MAGNESIUM: Magnesium: 1.7 mg/dL (ref 1.7–2.4)

## 2024-05-21 MED ORDER — LEVOTHYROXINE SODIUM 25 MCG PO TABS: 137.0000 ug | ORAL_TABLET | Freq: Every day | ORAL | Status: DC

## 2024-05-21 MED ORDER — PANTOPRAZOLE SODIUM 40 MG PO TBEC: 40.0000 mg | DELAYED_RELEASE_TABLET | Freq: Two times a day (BID) | ORAL | Status: DC

## 2024-05-21 MED ORDER — MAGNESIUM OXIDE -MG SUPPLEMENT 400 (240 MG) MG PO TABS
800.0000 mg | ORAL_TABLET | Freq: Once | ORAL | Status: AC
  Administered 2024-05-21: 800 mg via ORAL
  Filled 2024-05-21: qty 2

## 2024-05-21 NOTE — Discharge Instructions (Signed)

## 2024-05-21 NOTE — Progress Notes (Addendum)
 Progress Note   Patient: Travis Padilla FMW:[id number] DOB: December 09, 1945 DOA: 05/15/2024     6 DOS: the patient was seen and examined on 05/21/2024   Brief hospital course:  Travis Padilla is a 78 y.o. male with medical history significant for prostate cancer, stage IV lung cancer, COPD, OSA with CPAP intolerance, CAD, hypothyroidism, and anxiety who presents with worsening nausea, abdominal discomfort, and constipation.     Patient reports that his symptoms began several days ago and have continued to worsen despite use of enemas.  He is not passing flatus.  He denies any recent chest pain or shortness of breath.     He was evaluated in an outpatient clinic for this and had labs and CT performed.  CT was concerning for high-grade SBO.  Did not improve with conservative measures so went to the OR on 9/8.        Assessment and Plan:  SBO- s/p laparoscopic lysis of adhesions by Dr. Stevie on 05/18/24 for SBO Had a bowel movement on 09/10 Tolerated clear liquid diet and has been advanced to a soft diet Continues to pass flatus If patient tolerates his diet, may be able to discharge home in a.m. Appreciate general surgery input    AKI  Resolved Likely prerenal secondary to nausea and vomiting Serum creatinine on admission was 2.84 and with hydration has normalized and is down to 1.07 Encourage oral fluid intake    COPD  Not acutely exacerbated Continue ICS-LAMA-LABA        CAD  No recent angina   Continue Coreg  Consider restarting PTA Plavix  on 05/22/2024 if okay with general surgery     Lung cancer  Stage IV, followed by Dr. Sherrod, currently under observation      Hypertension  Stable blood pressure on Coreg     Hypothyroidism  Continue Synthroid    Obesity Class I Estimated body mass index is 30.66 kg/m as calculated from the following:   Height as of this encounter: 6' 2 (1.88 m).   Weight as of this encounter: 108.3 kg.  Complicates overall prognosis and  care Lifestyle modification and exercise has been discussed with patient in detail    Hypomagnesemia Supplement magnesium      Subjective: No new complaints.  Tolerated clear liquid diet and has been advanced to soft solid.  Denies having any abdominal pain.  Physical Exam: Vitals:   05/20/24 1301 05/20/24 2040 05/21/24 0500 05/21/24 0600  BP: 119/74 120/76  111/76  Pulse: (!) 103 90  (!) 106  Resp: 18 18  19   Temp: 97.7 F (36.5 C) 97.8 F (36.6 C)  98.2 F (36.8 C)  TempSrc:  Oral  Oral  SpO2: 97% 97%  98%  Weight:   112.9 kg   Height:       Gen:- Awake Alert, in no acute distress  HEENT:- [place].AT, No sclera icterus Neck-Supple Neck,No JVD,.  Lungs-  CTAB , fair air movement bilaterally  CV- S1, S2 normal, RRR Abd-  +ve B.Sounds, Abd Soft, No significant tenderness, postop wound intact Extremity/Skin:- No  edema,   good pedal pulses  Psych-affect is appropriate, oriented x3 Neuro-no new focal deficits, no tremors   Data Reviewed: Labs reviewed.  Within normal limits Reviewed  Family Communication: Plan of care discussed with patient in detail at the bedside.  He verbalizes understanding and agrees with the plan.  Disposition: Status is: Inpatient Remains inpatient appropriate because: For discharge in a.m. if patient tolerates his diet  Planned Discharge Destination:  Home    Time spent: 33 minutes  Author: Aimee Somerset, MD 05/21/2024 1:07 PM  For on call review [email or web address].

## 2024-05-21 NOTE — TOC Progression Note (Signed)
 Transition of Care [facility]) - Progression Note    Patient Details  Name: Travis Padilla MRN: [id number] Date of Birth: 09/15/1945  [facility]) CM/SW Contact  Alfonse JONELLE Rex, RN Phone Number: 05/21/2024, 9:34 AM  Clinical Narrative:   per surgery progress notes 9/11:  advance to soft diet - Mobilize, pulm toilet . TOC will continue to follow.                       Expected Discharge Plan and Services                                               Social Drivers of Health (SDOH) Interventions SDOH Screenings   Food Insecurity: No Food Insecurity (05/15/2024)  Housing: Low Risk  (05/15/2024)  Transportation Needs: No Transportation Needs (05/15/2024)  Utilities: Not At Risk (05/15/2024)  Alcohol Screen: Low Risk  (11/25/2023)  Depression (PHQ2-9): Low Risk  (05/14/2024)  Financial Resource Strain: Low Risk  (11/25/2023)  Physical Activity: Sufficiently Active (11/25/2023)  Social Connections: Moderately Isolated (05/15/2024)  Stress: No Stress Concern Present (11/25/2023)  Tobacco Use: Medium Risk (05/18/2024)  Health Literacy: Adequate Health Literacy (11/25/2023)    Readmission Risk Interventions     No data to display

## 2024-05-21 NOTE — Discharge Summary (Signed)
 Physician Discharge Summary   Patient: Travis Padilla MRN: [id number] DOB: 06-04-1946  Admit date:     05/15/2024  Discharge date: 05/21/24  Discharge Physician: Josette Shimabukuro   PCP: Jolinda Norene CHRISTELLA, DO   Recommendations at discharge:   Keep scheduled follow-up appointment with surgery Return to emergency room if worsening symptoms  Discharge Diagnoses: Principal Problem:   SBO (small bowel obstruction) (HCC) Active Problems:   Essential hypertension   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HCC)   Acquired hypothyroidism   Coronary artery disease involving native coronary artery of native heart without angina pectoris   COPD (chronic obstructive pulmonary disease) (HCC)   AKI (acute kidney injury) (HCC)   GAD (generalized anxiety disorder)  Resolved Problems:   * No resolved hospital problems. *  Hospital Course:  Travis Padilla is a 78 y.o. male with medical history significant for prostate cancer, stage IV lung cancer, COPD, OSA with CPAP intolerance, CAD, hypothyroidism, and anxiety who presents with worsening nausea, abdominal discomfort, and constipation.     Patient reports that his symptoms began several days ago and have continued to worsen despite use of enemas.  He is not passing flatus.  He denies any recent chest pain or shortness of breath.     He was evaluated in an outpatient clinic for this and had labs and CT performed.  CT was concerning for high-grade SBO.  Did not improve with conservative measures so went to the OR on 9/8.     Assessment and Plan:  SBO- s/p laparoscopic lysis of adhesions by Dr. Stevie on 05/18/24 for SBO Had a bowel movement on 09/10 Tolerated clear liquid diet and has been advanced to a soft diet Continues to pass flatus Appreciate general surgery input For discharge home today     AKI  Resolved Likely prerenal secondary to nausea and vomiting Serum creatinine on admission was 2.84 and with hydration has  normalized and is down to 1.07 Encourage oral fluid intake     COPD  Not acutely exacerbated Continue ICS-LAMA-LABA          CAD  No recent angina   Continue Coreg  Consider restarting PTA Plavix  on 05/22/2024 if okay with general surgery      Lung cancer  Stage IV, followed by Dr. Sherrod, currently under observation      Hypertension  Stable blood pressure on Coreg      Hypothyroidism  Continue Synthroid    Obesity Class I Estimated body mass index is 30.66 kg/m as calculated from the following:   Height as of this encounter: 6' 2 (1.88 m).   Weight as of this encounter: 108.3 kg.  Complicates overall prognosis and care Lifestyle modification and exercise has been discussed with patient in detail     Hypomagnesemia Supplemented        Consultants: Surgery Procedures performed: Laparoscopic lysis of adhesions Disposition: Home Diet recommendation:  Cardiac diet DISCHARGE MEDICATION: Allergies as of 05/21/2024       Reactions   Iodinated Contrast Media Hives, Rash   Pt presented to CT after completed 13 hr premedication regimen and still had an minor rash/hives.    Iohexol  Rash   CT scan contrast caused rash and itching    does fine with premeds.   Pravastatin     Numbness in feet         Medication List     STOP taking these medications    semaglutide -weight management 2.4 MG/0.75ML Soaj SQ injection Commonly known  as: WEGOVY        TAKE these medications    acetaminophen  500 MG tablet Commonly known as: TYLENOL  Take 1,000 mg by mouth every 6 (six) hours as needed for moderate pain.   albuterol  (2.5 MG/3ML) 0.083% nebulizer solution Commonly known as: PROVENTIL  Take 3 mLs (2.5 mg total) by nebulization every 6 (six) hours as needed for wheezing or shortness of breath.   albuterol  108 (90 Base) MCG/ACT inhaler Commonly known as: VENTOLIN  HFA INHALE 2 PUFFS INTO THE LUNGS EVERY 6 HOURS AS NEEDED.   ALIGN PO Take 1 tablet by mouth 2  (two) times daily. gummy   b complex vitamins capsule Take 1 capsule by mouth daily.   carvedilol  25 MG tablet Commonly known as: COREG  TAKE 1 TABLET (25 MG TOTAL) BY MOUTH TWICE A DAY WITH MEALS What changed: See the new instructions.   clopidogrel  75 MG tablet Commonly known as: PLAVIX  TAKE 1 TABLET BY MOUTH EVERY DAY   [employer] Iron  325 (65 Fe) MG tablet Generic drug: ferrous sulfate  TAKE 1 TABLET BY MOUTH DAILY What changed:  how much to take when to take this   Incruse Ellipta  62.5 MCG/ACT Aepb Generic drug: umeclidinium bromide  Inhale 1 puff into the lungs daily.   levothyroxine  137 MCG tablet Commonly known as: SYNTHROID  TAKE 1 TABLET BY MOUTH DAILY. *DOSE CHANGE. REPEAT LABS IN MAY What changed: See the new instructions.   methocarbamol 500 MG tablet Commonly known as: ROBAXIN Take 500 mg by mouth every 6 (six) hours as needed for muscle spasms.   MIRALAX PO Take by mouth daily.   multivitamin capsule Take 1 capsule by mouth daily.   Myrbetriq 50 MG Tb24 tablet Generic drug: mirabegron ER Take 50 mg by mouth daily.   nitroGLYCERIN  0.4 MG SL tablet Commonly known as: NITROSTAT  Place 1 tablet (0.4 mg total) under the tongue every 5 (five) minutes as needed for chest pain.   ondansetron  4 MG disintegrating tablet Commonly known as: ZOFRAN -ODT Take 1 tablet (4 mg total) by mouth every 8 (eight) hours as needed for nausea or vomiting.   oxyCODONE  5 MG immediate release tablet Commonly known as: Roxicodone  Take 0.5 tablets (2.5 mg total) by mouth every 6 (six) hours as needed for severe pain (pain score 7-10).   pantoprazole  40 MG tablet Commonly known as: PROTONIX  TAKE 1 TABLET BY MOUTH EVERY DAY BEFORE BREAKFAST What changed: See the new instructions.   Praluent  150 MG/ML Soaj Generic drug: Alirocumab  INJECT 150 MG INTO THE SKIN EVERY 14 (FOURTEEN) DAYS.   SOOTHE XP OP Place 1 drop into both eyes 2 (two) times daily.   tamsulosin  0.4 MG Caps  capsule Commonly known as: FLOMAX  Take 0.4 mg by mouth at bedtime.   Trelegy Ellipta  200-62.5-25 MCG/ACT Aepb Generic drug: Fluticasone -Umeclidin-Vilant Inhale 1 puff into the lungs daily. What changed: Another medication with the same name was removed. Continue taking this medication, and follow the directions you see here.        Follow-up Information     Maczis, Puja Gosai, PA-C Follow up.   Specialty: General Surgery Why: 10/2 at 3:30. Please bring a copy of your photo ID, insurance card and arrive 30 minutes prior to your appointment for paperwork. Contact information: [address] [place] [address] [phone]                Discharge Exam: Fredricka Weights   05/19/24 0545 05/20/24 1144 05/21/24 0500  Weight: 109.5 kg 108.3 kg 112.9  kg   Gen:- Awake Alert, in no acute distress  HEENT:- [place].AT, No sclera icterus Neck-Supple Neck,No JVD,.  Lungs-  CTAB , fair air movement bilaterally  CV- S1, S2 normal, RRR Abd-  +ve B.Sounds, Abd Soft, No significant tenderness, postop wound intact Extremity/Skin:- No  edema,   good pedal pulses  Psych-affect is appropriate, oriented x3 Neuro-no new focal deficits, no tremors  Condition at discharge: stable  The results of significant diagnostics from this hospitalization (including imaging, microbiology, ancillary and laboratory) are listed below for reference.   Imaging Studies: DG Abd Portable 1V Result Date: 05/19/2024 CLINICAL DATA:  [id number] SBO (small bowel obstruction) (HCC) [id number] EXAM: PORTABLE ABDOMEN - 1 VIEW COMPARISON:  May 17, 2024 FINDINGS: Esophagogastric tube terminates in the region of the stomach. Couple of mildly dilated segments of small bowel persist in the left hemiabdomen. Gas throughout the colon.No pneumoperitoneum. No organomegaly or radiopaque calculi. Pelvic phleboliths. No acute fracture or destructive lesion. Multilevel thoracic osteophytosis. IMPRESSION: Similar dilation of a  couple of gas-filled segments of small bowel in the left hemiabdomen. Gas throughout the colon. Well-positioned esophagogastric tube. Electronically Signed   By: Rogelia Myers M.D.   On: 05/19/2024 09:00   DG Abd 2 Views Result Date: 05/17/2024 CLINICAL DATA:  Small-bowel obstruction EXAM: ABDOMEN - 2 VIEW COMPARISON:  Abdominal radiograph dated 05/16/2024 FINDINGS: Gastric/enteric tube tip projects over the stomach. Similar gas-filled mildly dilated bowel loops remain in the left hemiabdomen. Prostate fiducials project over the lower midline pelvis. IMPRESSION: Similar gas-filled mildly dilated bowel loops remain in the left hemiabdomen. Electronically Signed   By: Limin  Xu M.D.   On: 05/17/2024 10:50   DG Abd 2 Views Result Date: 05/16/2024 CLINICAL DATA:  Small-bowel obstruction EXAM: ABDOMEN - 2 VIEW COMPARISON:  Abdominal radiograph dated 05/15/2024 FINDINGS: Gastric/enteric tube tip projects over the stomach. Partially imaged right chest wall port tip projects over the superior cavoatrial junction. Partially imaged prostate fiducials. Decreased number of gas-filled dilated bowel loops in the left hemiabdomen. IMPRESSION: Decreased number of gas-filled dilated bowel loops in the left hemiabdomen. Electronically Signed   By: Limin  Xu M.D.   On: 05/16/2024 10:10   DG Abd Portable 1 View Result Date: 05/15/2024 CLINICAL DATA:  Enteric catheter placement EXAM: PORTABLE ABDOMEN - 1 VIEW COMPARISON:  05/15/2024 FINDINGS: Supine frontal view of the abdomen and pelvis excludes the bilateral flanks, right hemidiaphragm, and pubic symphysis by collimation. Enteric catheter tip and side port project over the gastric fundus. Continue distension the stomach and small bowel. No masses or abnormal calcifications. IMPRESSION: 1. Enteric catheter tip and side port projecting over the gastric fundus. 2. Persistent gaseous distention of the stomach and small bowel consistent with obstruction. Electronically Signed   By:  Ozell Daring M.D.   On: 05/15/2024 20:03   DG Abd Portable 1 View Result Date: 05/15/2024 CLINICAL DATA:  NG tube placement EXAM: PORTABLE ABDOMEN - 1 VIEW COMPARISON:  05/02/2015 FINDINGS: NG tube tip is in the right lower lobe airway. Recommend complete removal and replacement. Mild gaseous distention of the stomach. IMPRESSION: NG tube tip in the right lower lobe airway. Recommend complete removal and replacement. These results were called by telephone at the time of interpretation on 05/15/2024 at 7:39 pm to provider CURTISTINE DAWN , who verbally acknowledged these results. Electronically Signed   By: Franky Crease M.D.   On: 05/15/2024 19:39   CT ABDOMEN PELVIS WO CONTRAST Result Date: 05/14/2024 CLINICAL DATA:  Bowel obstruction suspected. Nausea and  vomiting. History of lung cancer and prostate cancer. EXAM: CT ABDOMEN AND PELVIS WITHOUT CONTRAST TECHNIQUE: Multidetector CT imaging of the abdomen and pelvis was performed following the standard protocol without IV contrast. RADIATION DOSE REDUCTION: This exam was performed according to the departmental dose-optimization program which includes automated exposure control, adjustment of the mA and/or kV according to patient size and/or use of iterative reconstruction technique. COMPARISON:  CT abdomen and pelvis 08/28/2023. FINDINGS: Lower chest: Post treatment changes in the right lung are partially imaged and unchanged. No acute findings. Hepatobiliary: No focal liver abnormality is seen. No gallstones, gallbladder wall thickening, or biliary dilatation. Pancreas: Unremarkable. No pancreatic ductal dilatation or surrounding inflammatory changes. Spleen: Normal in size without focal abnormality. Adrenals/Urinary Tract: There are punctate nonobstructing bilateral renal calculi measuring up to 7 mm. There is no hydronephrosis. There is a cyst in the superior pole of the left kidney measuring 1 cm. The bladder is completely decompressed and not well evaluated.  There some questionable mild inflammation surrounding the bladder. The adrenal glands appear normal. Stomach/Bowel: There are numerous dilated proximal small bowel loops with air-fluid levels measuring up to 5 cm. Transition point is seen in the central abdomen image 2/60. Distal small bowel is completely decompressed. There is no pneumatosis or free air. The stomach is dilated with air-fluid level. Colon is nondilated. The appendix is within normal limits. There is sigmoid colon diverticulosis. Vascular/Lymphatic: Aortic atherosclerosis. No enlarged abdominal or pelvic lymph nodes. Reproductive: Prostate radiotherapy seeds are present. Other: No abdominal wall hernia or abnormality. No abdominopelvic ascites. Musculoskeletal: Degenerative changes affect the spine. No acute osseous abnormality. IMPRESSION: 1. High-grade small bowel obstruction with transition point in the central abdomen. 2. Nonobstructing bilateral renal calculi. 3. Questionable mild inflammation surrounding the bladder. Correlate clinically for cystitis. 4. Aortic atherosclerosis. Aortic Atherosclerosis (ICD10-I70.0). Electronically Signed   By: Greig Pique M.D.   On: 05/14/2024 17:31    Microbiology: Results for orders placed or performed in visit on 08/27/23  Fecal occult blood, imunochemical     Status: None   Collection Time: 08/27/23  3:50 PM   Specimen: Stool   ST  Result Value Ref Range Status   Fecal Occult Bld Negative Negative Final   *Note: Due to a large number of results and/or encounters for the requested time period, some results have not been displayed. A complete set of results can be found in Results Review.    Labs: CBC: Recent Labs  Lab 05/16/24 0509 05/17/24 0445 05/18/24 0415 05/19/24 0432 05/20/24 0444  WBC 9.7 7.9 6.8 7.2 5.2  HGB 13.3 12.0* 12.3* 12.0* 11.4*  HCT 38.5* 35.6* 37.8* 36.7* 35.7*  MCV 99.2 101.4* 101.6* 103.7* 102.0*  PLT 170 153 157 164 164   Basic Metabolic Panel: Recent Labs   Lab 05/17/24 0445 05/18/24 0415 05/19/24 0432 05/20/24 0444 05/21/24 0851  NA 141 145 143 146* 142  K 3.5 4.1 4.5 3.4* 4.5  CL 101 106 106 109 107  CO2 24 22 20* 22 23  GLUCOSE 75 75 117* 77 118*  BUN 34* 22 18 17 12   CREATININE 1.38* 1.11 1.05 1.02 1.09  CALCIUM 8.6* 8.9 8.9 9.0 9.4  MG  --  2.5*  --   --  1.7   Liver Function Tests: Recent Labs  Lab 05/14/24 1522 05/15/24 1810 05/16/24 0509  AST 22 24 21   ALT 13 12 11   ALKPHOS 72 64 56  BILITOT 1.1 1.6* 1.3*  PROT 8.0 7.9 6.7  ALBUMIN  4.8  4.5 3.9   CBG: Recent Labs  Lab 05/20/24 1122 05/20/24 1723 05/20/24 2346 05/21/24 0558 05/21/24 1120  GLUCAP 102* 128* 102* 108* 142*    Discharge time spent: greater than 30 minutes.  Signed: Aimee Somerset, MD [facility] 05/21/2024

## 2024-05-21 NOTE — Progress Notes (Signed)
 3 Days Post-Op  Subjective: CC: Feeling better today. Tolerating cld without abdominal pain, bloating or n/v. Passing flatus. BM yesterday. Voiding. Mobilizing.   Objective: Vital signs in last 24 hours: Temp:  [97.7 F (36.5 C)-98.2 F (36.8 C)] 98.2 F (36.8 C) (09/11 0600) Pulse Rate:  [90-106] 106 (09/11 0600) Resp:  [18-19] 19 (09/11 0600) BP: (111-120)/(74-76) 111/76 (09/11 0600) SpO2:  [97 %-98 %] 98 % (09/11 0600) Weight:  [108.3 kg-112.9 kg] 112.9 kg (09/11 0500) Last BM Date : 05/20/24  Intake/Output from previous day: 09/10 0701 - 09/11 0700 In: 2404.7 [P.O.:1080; I.V.:1024.1; IV Piggyback:300.6] Out: -  Intake/Output this shift: No intake/output data recorded.  PE: Gen:  Alert, NAD, pleasant Pulm:  Rate and effort normal Abd: Soft, ND, appropriately tender around laparoscopic incisions, no rigidity or guarding and otherwise NT. Incisions with glue intact appears well and are without drainage, bleeding, or signs of infection.   Lab Results:  Recent Labs    05/19/24 0432 05/20/24 0444  WBC 7.2 5.2  HGB 12.0* 11.4*  HCT 36.7* 35.7*  PLT 164 164   BMET Recent Labs    05/19/24 0432 05/20/24 0444  NA 143 146*  K 4.5 3.4*  CL 106 109  CO2 20* 22  GLUCOSE 117* 77  BUN 18 17  CREATININE 1.05 1.02  CALCIUM 8.9 9.0   PT/INR No results for input(s): LABPROT, INR in the last 72 hours. CMP     Component Value Date/Time   NA 146 (H) 05/20/2024 0444   NA 142 05/14/2024 1522   K 3.4 (L) 05/20/2024 0444   CL 109 05/20/2024 0444   CO2 22 05/20/2024 0444   GLUCOSE 77 05/20/2024 0444   BUN 17 05/20/2024 0444   BUN 33 (H) 05/14/2024 1522   CREATININE 1.02 05/20/2024 0444   CREATININE 0.96 11/22/2023 1026   CALCIUM 9.0 05/20/2024 0444   PROT 6.7 05/16/2024 0509   PROT 8.0 05/14/2024 1522   ALBUMIN  3.9 05/16/2024 0509   ALBUMIN  4.8 05/14/2024 1522   AST 21 05/16/2024 0509   AST 21 11/22/2023 1026   ALT 11 05/16/2024 0509   ALT 13 11/22/2023  1026   ALKPHOS 56 05/16/2024 0509   BILITOT 1.3 (H) 05/16/2024 0509   BILITOT 1.1 05/14/2024 1522   BILITOT 0.4 11/22/2023 1026   GFRNONAA >60 05/20/2024 0444   GFRNONAA >60 11/22/2023 1026   GFRAA >60 06/13/2020 0826   Lipase     Component Value Date/Time   LIPASE 51 05/15/2024 1810    Studies/Results: No results found.   Anti-infectives: Anti-infectives (From admission, onward)    Start     Dose/Rate Route Frequency Ordered Stop   05/18/24 0830  ceFAZolin  (ANCEF ) IVPB 2g/100 mL premix        2 g 200 mL/hr over 30 Minutes Intravenous On call to O.R. 05/18/24 0743 05/18/24 1109        Assessment/Plan POD 3 s/p laparoscopic lysis of adhesions by Dr. Stevie on 05/18/24 for SBO - Intra-op findings: small bilateral inguinal hernias, adhesions of sigmoid colon to the left abdomen wall, dilation of the mid jejunum with some hemorrhagic changes. no definite evidence of mechanical bowel obstruction  - Advance to soft diet - Mobilize, pulm toilet  FEN - Soft VTE - SCDs, SQH ID - Ancef  peri-op. None currently.  Foley - None, spont void   LOS: 6 days    Travis Padilla, [facility] Surgery 05/21/2024, 9:00 AM Please see Amion for pager  number during day hours 7:00am-4:30pm

## 2024-05-21 NOTE — Plan of Care (Signed)
   Problem: Clinical Measurements: Goal: Will remain free from infection Outcome: Progressing Goal: Diagnostic test results will improve Outcome: Progressing

## 2024-05-22 ENCOUNTER — Telehealth: Payer: Self-pay | Admitting: *Deleted

## 2024-05-22 NOTE — Transitions of Care (Post Inpatient/ED Visit) (Signed)
   05/22/2024  Name: Travis Padilla MRN: [id number] DOB: Oct 14, 1945  Today's TOC FU Call Status: Today's TOC FU Call Status:: Unsuccessful Call (1st Attempt) Unsuccessful Call (1st Attempt) Date: 05/22/24  Attempted to reach the patient regarding the most recent Inpatient/ED visit.  Follow Up Plan: Additional outreach attempts will be made to reach the patient to complete the Transitions of Care (Post Inpatient/ED visit) call.   Mliss Creed [facility], BSN RN Care Manager/ Transition of Care [place]/ [facility] [phone]

## 2024-05-25 ENCOUNTER — Other Ambulatory Visit: Payer: Self-pay | Admitting: Internal Medicine

## 2024-05-25 ENCOUNTER — Inpatient Hospital Stay: Attending: Adult Health

## 2024-05-25 ENCOUNTER — Ambulatory Visit (HOSPITAL_COMMUNITY)
Admission: RE | Admit: 2024-05-25 | Discharge: 2024-05-25 | Disposition: A | Discharge status: Home / Self Care | Source: Ambulatory Visit | Attending: Internal Medicine | Admitting: Internal Medicine

## 2024-05-25 ENCOUNTER — Ambulatory Visit (HOSPITAL_COMMUNITY): Admission: RE | Admit: 2024-05-25 | Source: Ambulatory Visit

## 2024-05-25 ENCOUNTER — Telehealth: Payer: Self-pay

## 2024-05-25 ENCOUNTER — Encounter (HOSPITAL_COMMUNITY): Payer: Self-pay

## 2024-05-25 DIAGNOSIS — M25552 Pain in left hip: Secondary | ICD-10-CM | POA: Diagnosis not present

## 2024-05-25 DIAGNOSIS — Z87891 Personal history of nicotine dependence: Secondary | ICD-10-CM | POA: Diagnosis not present

## 2024-05-25 DIAGNOSIS — G8929 Other chronic pain: Secondary | ICD-10-CM | POA: Diagnosis not present

## 2024-05-25 DIAGNOSIS — I700 Atherosclerosis of aorta: Secondary | ICD-10-CM | POA: Diagnosis not present

## 2024-05-25 DIAGNOSIS — Z8546 Personal history of malignant neoplasm of prostate: Secondary | ICD-10-CM | POA: Insufficient documentation

## 2024-05-25 DIAGNOSIS — J449 Chronic obstructive pulmonary disease, unspecified: Secondary | ICD-10-CM | POA: Insufficient documentation

## 2024-05-25 DIAGNOSIS — Z9221 Personal history of antineoplastic chemotherapy: Secondary | ICD-10-CM | POA: Insufficient documentation

## 2024-05-25 DIAGNOSIS — Z923 Personal history of irradiation: Secondary | ICD-10-CM | POA: Insufficient documentation

## 2024-05-25 DIAGNOSIS — C3490 Malignant neoplasm of unspecified part of unspecified bronchus or lung: Secondary | ICD-10-CM | POA: Insufficient documentation

## 2024-05-25 DIAGNOSIS — C3412 Malignant neoplasm of upper lobe, left bronchus or lung: Secondary | ICD-10-CM | POA: Diagnosis not present

## 2024-05-25 DIAGNOSIS — M533 Sacrococcygeal disorders, not elsewhere classified: Secondary | ICD-10-CM | POA: Diagnosis not present

## 2024-05-25 DIAGNOSIS — E039 Hypothyroidism, unspecified: Secondary | ICD-10-CM | POA: Diagnosis not present

## 2024-05-25 LAB — CBC WITH DIFFERENTIAL (CANCER CENTER ONLY)
Abs Immature Granulocytes: 0.08 K/uL — ABNORMAL HIGH (ref 0.00–0.07)
Basophils Absolute: 0 K/uL (ref 0.0–0.1)
Basophils Relative: 1 %
Eosinophils Absolute: 0.1 K/uL (ref 0.0–0.5)
Eosinophils Relative: 2 %
HCT: 33.9 % — ABNORMAL LOW (ref 39.0–52.0)
Hemoglobin: 11.8 g/dL — ABNORMAL LOW (ref 13.0–17.0)
Immature Granulocytes: 1 %
Lymphocytes Relative: 13 %
Lymphs Abs: 0.9 K/uL (ref 0.7–4.0)
MCH: 33.7 pg (ref 26.0–34.0)
MCHC: 34.8 g/dL (ref 30.0–36.0)
MCV: 96.9 fL (ref 80.0–100.0)
Monocytes Absolute: 0.7 K/uL (ref 0.1–1.0)
Monocytes Relative: 11 %
Neutro Abs: 4.9 K/uL (ref 1.7–7.7)
Neutrophils Relative %: 72 %
Platelet Count: 205 K/uL (ref 150–400)
RBC: 3.5 MIL/uL — ABNORMAL LOW (ref 4.22–5.81)
RDW: 11.9 % (ref 11.5–15.5)
WBC Count: 6.8 K/uL (ref 4.0–10.5)
nRBC: 0 % (ref 0.0–0.2)

## 2024-05-25 LAB — CMP (CANCER CENTER ONLY)
ALT: 18 U/L (ref 0–44)
AST: 20 U/L (ref 15–41)
Albumin: 3.6 g/dL (ref 3.5–5.0)
Alkaline Phosphatase: 60 U/L (ref 38–126)
Anion gap: 6 (ref 5–15)
BUN: 10 mg/dL (ref 8–23)
CO2: 26 mmol/L (ref 22–32)
Calcium: 8.3 mg/dL — ABNORMAL LOW (ref 8.9–10.3)
Chloride: 109 mmol/L (ref 98–111)
Creatinine: 1.16 mg/dL (ref 0.61–1.24)
GFR, Estimated: >60 mL/min (ref >60)
Glucose, Bld: 111 mg/dL — ABNORMAL HIGH (ref 70–99)
Potassium: 3.8 mmol/L (ref 3.5–5.1)
Sodium: 141 mmol/L (ref 135–145)
Total Bilirubin: 0.4 mg/dL (ref 0.0–1.2)
Total Protein: 6.4 g/dL — ABNORMAL LOW (ref 6.5–8.1)

## 2024-05-25 NOTE — Transitions of Care (Post Inpatient/ED Visit) (Signed)
   05/25/2024  Name: BRELAN HANNEN MRN: [id number] DOB: 20-Jun-1946  Today's TOC FU Call Status: Today's TOC FU Call Status:: Unsuccessful Call (2nd Attempt) Unsuccessful Call (2nd Attempt) Date: 05/25/24  Attempted to reach the patient regarding the most recent Inpatient/ED visit.  Follow Up Plan: Additional outreach attempts will be made to reach the patient to complete the Transitions of Care (Post Inpatient/ED visit) call.   Arvin Seip RN, BSN, CCM [employer], Population Health Case Manager Phone: [phone]

## 2024-05-26 ENCOUNTER — Telehealth: Payer: Self-pay | Admitting: *Deleted

## 2024-05-26 NOTE — Transitions of Care (Post Inpatient/ED Visit) (Signed)
 05/26/2024  Name: Travis Padilla MRN: [id number] DOB: 1945/12/19  Today's TOC FU Call Status: Today's TOC FU Call Status:: Successful TOC FU Call Completed TOC FU Call Complete Date: 05/26/24 Patient's Name and Date of Birth confirmed.  Transition Care Management Follow-up Telephone Call Date of Discharge: 05/21/24 Discharge Facility: [facility]) Type of Discharge: Inpatient Admission Primary Inpatient Discharge Diagnosis:: SBO small bowel obstruction How have you been since you were released from the hospital?:  (eating, drinking well, having regular bowel movements, eating small, soft meals) Any questions or concerns?: No  Items Reviewed: Did you receive and understand the discharge instructions provided?: No Medications obtained,verified, and reconciled?: Yes (Medications Reviewed) Any new allergies since your discharge?: No Dietary orders reviewed?: Yes Type of Diet Ordered:: heart healthy,  soft foods and advance Do you have support at home?: Yes People in Home [RPT]: alone Name of Support/Comfort Primary Source: neighbor-  pt does not share name Reviewed signs /symptoms SBO, reportable signs /symptoms  Medications Reviewed Today: Medications Reviewed Today     Reviewed by Aura Mliss LABOR, RN (Registered Nurse) on 05/26/24 at 1133  Med List Status: <None>   Medication Order Taking? Sig Documenting Provider Last Dose Status Informant  acetaminophen  (TYLENOL ) 500 MG tablet [id number] Yes Take 1,000 mg by mouth every 6 (six) hours as needed for moderate pain. [provider]  Active Self, Pharmacy Records  albuterol  (PROVENTIL ) (2.5 MG/3ML) 0.083% nebulizer solution [id number] Yes Take 3 mLs (2.5 mg total) by nebulization every 6 (six) hours as needed for wheezing or shortness of breath. Parrett, Madelin RAMAN, NP  Active Self, Pharmacy Records  albuterol  (VENTOLIN  HFA) 108 (90 Base) MCG/ACT inhaler [id number] Yes INHALE 2 PUFFS INTO THE LUNGS EVERY 6 HOURS AS NEEDED.  Jolinda Norene CHRISTELLA, DO  Active Self, Pharmacy Records  Artificial Tear Solution (SOOTHE XP OP) [id number] Yes Place 1 drop into both eyes 2 (two) times daily. [provider]  Active Self, Pharmacy Records  b complex vitamins capsule [id number] Yes Take 1 capsule by mouth daily. [provider]  Active Self, Pharmacy Records  carvedilol  (COREG ) 25 MG tablet [id number] Yes TAKE 1 TABLET (25 MG TOTAL) BY MOUTH TWICE A DAY WITH MEALS Lavona Agent, MD  Active Self, Pharmacy Records  clopidogrel  (PLAVIX ) 75 MG tablet [id number] Yes TAKE 1 TABLET BY MOUTH EVERY DAY Lavona Agent, MD  Active Self, Pharmacy Records  [facility] IRON  325 (65 Fe) MG tablet [id number] Yes TAKE 1 TABLET BY MOUTH DAILY Henry Manuelita NOVAK, NP  Active Self, Pharmacy Records  Fluticasone -Umeclidin-Vilant (TRELEGY ELLIPTA ) 200-62.5-25 MCG/ACT AEPB [id number] Yes Inhale 1 puff into the lungs daily. Neda Jennet LABOR, MD  Active Self, Pharmacy Records  levothyroxine  (SYNTHROID ) 137 MCG tablet [id number] Yes TAKE 1 TABLET BY MOUTH DAILY. *DOSE CHANGE. REPEAT LABS IN MAY Glenarden, Norene M, DO  Active Self, Pharmacy Records  methocarbamol (ROBAXIN) 500 MG tablet [id number] Yes Take 500 mg by mouth every 6 (six) hours as needed for muscle spasms. [provider]  Active Self, Pharmacy Records  Multiple Vitamin (MULTIVITAMIN) capsule [id number] Yes Take 1 capsule by mouth daily. [provider]  Active Self, Pharmacy Records  MYRBETRIQ 50 MG TB24 tablet [id number] Yes Take 50 mg by mouth daily. [provider]  Active Self, Pharmacy Records  nitroGLYCERIN  (NITROSTAT ) 0.4 MG SL tablet [id number] Yes Place 1 tablet (0.4 mg total) under the tongue every 5 (five) minutes as needed for chest pain. Lavona Agent, MD  Active Self, Pharmacy Records  Med Note CARLEEN, CHANELLE D   Sun May 17, 2024  1:29 PM) Has not needed  ondansetron  (ZOFRAN -ODT) 4 MG disintegrating tablet [id number] Yes Take 1 tablet (4  mg total) by mouth every 8 (eight) hours as needed for nausea or vomiting. Dettinger, Fonda LABOR, MD  Active Self, Pharmacy Records  oxyCODONE  (ROXICODONE ) 5 MG immediate release tablet [id number] Yes Take 0.5 tablets (2.5 mg total) by mouth every 6 (six) hours as needed for severe pain (pain score 7-10). Silver Wonda LABOR, PA  Active Self, Pharmacy Records  pantoprazole  (PROTONIX ) 40 MG tablet [id number] Yes TAKE 1 TABLET BY MOUTH EVERY DAY BEFORE OFILIA Lavona Agent, MD  Active Self, Pharmacy Records  Polyethylene Glycol 3350  (MIRALAX PO) [id number] Yes Take by mouth daily. [provider]  Active Self, Pharmacy Records  PRALUENT  150 MG/ML SOAJ [id number] Yes INJECT 150 MG INTO THE SKIN EVERY 14 (FOURTEEN) DAYS. Lavona Agent, MD  Active Self, Pharmacy Records  Probiotic Product (ALIGN PO) [id number] Yes Take 1 tablet by mouth 2 (two) times daily. gummy [provider]  Active Self, Pharmacy Records  tamsulosin  (FLOMAX ) 0.4 MG CAPS capsule [id number] Yes Take 0.4 mg by mouth at bedtime. [provider]  Active Self, Pharmacy Records  umeclidinium bromide  (INCRUSE ELLIPTA ) 62.5 MCG/ACT AEPB [id number]  Inhale 1 puff into the lungs daily.  Patient not taking: Reported on 05/26/2024   Orlie Madelin RAMAN, NP  Active Self, Pharmacy Records            Home Care and Equipment/Supplies: Were Home Health Services Ordered?: No Any new equipment or medical supplies ordered?: No  Functional Questionnaire: Do you need assistance with bathing/showering or dressing?: No Do you need assistance with meal preparation?: No Do you need assistance with eating?: No Do you have difficulty maintaining continence: No Do you need assistance with getting out of bed/getting out of a chair/moving?: No Do you have difficulty managing or taking your medications?: No  Follow up appointments reviewed: PCP Follow-up appointment confirmed?: Yes Date of PCP follow-up appointment?:  06/12/24 Follow-up Provider: Norene Fielding    pt declines for RN CM to schedule sooner appointment Specialist Hospital Follow-up appointment confirmed?: Yes Date of Specialist follow-up appointment?: 06/11/24 Follow-Up Specialty Provider:: surgeon  Dr. Maczis Do you need transportation to your follow-up appointment?: No Do you understand care options if your condition(s) worsen?: Yes-patient verbalized understanding  SDOH Interventions Today    Flowsheet Row Most Recent Value  SDOH Interventions   Food Insecurity Interventions Intervention Not Indicated  Housing Interventions Intervention Not Indicated  Transportation Interventions Intervention Not Indicated  Utilities Interventions Intervention Not Indicated    Mliss Creed [facility], BSN RN Care Manager/ Transition of Care [place]/ [facility] Population Health [phone]

## 2024-06-04 ENCOUNTER — Telehealth: Payer: Self-pay | Admitting: Internal Medicine

## 2024-06-04 ENCOUNTER — Inpatient Hospital Stay (HOSPITAL_BASED_OUTPATIENT_CLINIC_OR_DEPARTMENT_OTHER): Admitting: Internal Medicine

## 2024-06-04 VITALS — BP 116/67 | HR 87 | Temp 98.2°F | Resp 16 | Ht 74.0 in | Wt 252.0 lb

## 2024-06-04 DIAGNOSIS — J449 Chronic obstructive pulmonary disease, unspecified: Secondary | ICD-10-CM | POA: Diagnosis not present

## 2024-06-04 DIAGNOSIS — Z8546 Personal history of malignant neoplasm of prostate: Secondary | ICD-10-CM | POA: Diagnosis not present

## 2024-06-04 DIAGNOSIS — C3490 Malignant neoplasm of unspecified part of unspecified bronchus or lung: Secondary | ICD-10-CM

## 2024-06-04 DIAGNOSIS — C3412 Malignant neoplasm of upper lobe, left bronchus or lung: Secondary | ICD-10-CM | POA: Diagnosis not present

## 2024-06-04 DIAGNOSIS — Z9221 Personal history of antineoplastic chemotherapy: Secondary | ICD-10-CM | POA: Diagnosis not present

## 2024-06-04 DIAGNOSIS — Z923 Personal history of irradiation: Secondary | ICD-10-CM | POA: Diagnosis not present

## 2024-06-04 DIAGNOSIS — E039 Hypothyroidism, unspecified: Secondary | ICD-10-CM | POA: Diagnosis not present

## 2024-06-04 NOTE — Telephone Encounter (Signed)
 Scheduled patient appointments. Called and spoke with the patient, he is aware.

## 2024-06-04 NOTE — Progress Notes (Signed)
 [facility] Telephone:([phone]) [phone]   [phone]  OFFICE PROGRESS NOTE  Jolinda Norene HERO, DO [address] [place] [address]  DIAGNOSIS:  1) Stage IV (T3, N0, M1c) non-small cell lung cancer, squamous cell carcinoma presented with large right infrahilar mass in addition to left upper lobe lung nodule as well as left axillary mass with left axillary lymph node diagnosed in August 2019. 2) prostate adenocarcinoma currently managed by Dr. Selma and Dr. Patrcia  PRIOR THERAPY:  1) Palliative radiotherapy to the right infrahilar mass as well as the axillary mass under the care of Dr. Dewey. 2) Systemic chemotherapy with carboplatin  for AUC of 5, paclitaxel  175 mg/M2 and Keytruda  200 mg IV every 3 weeks status post 4 cycles. 3) Maintenance immunotherapy with single agent Keytruda  200 mg IV every 3 weeks status post 41 cycles.  His treatment is currently on hold secondary to intolerance.  CURRENT THERAPY: Observation.  INTERVAL HISTORY: ROBSON TRICKEY 78 y.o. male returns to the clinic today for 65-month follow-up visit.Discussed the use of AI scribe software for clinical note transcription with the patient, who gave verbal consent to proceed.  History of Present Illness SCHUYLER OLDEN is a 78 year old male with stage 4 non-small cell lung cancer who presents for evaluation and repeat CT scan for disease status.  He has a history of stage 4 non-small cell lung cancer, diagnosed in August 2019. Initial treatment included palliative radiotherapy to the infrahilar mass and systemic chemotherapy with carboplatin . This was followed by Keytruda  every three weeks for four cycles, then maintenance treatment with Keytruda  for 41 cycles. He has been on observation since then and is here for a repeat CT scan of the chest, abdomen, and pelvis.  He also has a history of prostate adenocarcinoma, managed by urology and radiation oncology.  Recently, he started Wegovy   approximately 11-12 weeks ago for weight management. However, he experienced dehydration, nausea, and vomiting, leading to a week-long hospitalization. A CT scan during this hospitalization indicated a blockage in the small intestine, but no obstruction was found upon further investigation. He has since stopped Wegovy  and is following up with his general practitioner and surgeon to determine the next steps. He reports weight fluctuation, having initially lost almost 12 pounds but has since regained weight, currently weighing 252 pounds. He attributes some of the weight gain to reduced physical activity following his recent health issues, but he has resumed gym activities this week.  He has COPD, managed with Trelegy, which he finds effective for most of the day. However, he requires albuterol  in the evenings to manage symptoms. He is under the care of a pulmonologist, Dr. Darral, who has adjusted his medications to better manage his condition. He feels bloated and experiences wheezing occasionally.      MEDICAL HISTORY: Past Medical History:  Diagnosis Date   Arthritis    Benign localized prostatic hyperplasia with lower urinary tract symptoms (LUTS)    CAD (coronary artery disease) 2013   cardiologist--- dr lavona;   08-26-2012 cath for poss ischemia on NUC-- moderate nonob CAD, aggressive medical manage;  staged cardiac cath 09/ 2022 PCI, DES x1 to mLAD (80%) ;   arthrectomy and DES prox-mid RCA Mild nonobstructive plaque in cath 2013   Chronic constipation    COPD with emphysema [facility])    pulmonology--- dr b. icard/ tammy parrett NP;   no oxygen, no daily inhaler   Diverticulosis of colon  w/ hx diverticulitis   Dyslipidemia    GAD (generalized anxiety disorder)    GERD (gastroesophageal reflux disease)    Hemorrhoids    internal and external   History of adenomatous polyp of colon    followed by dr avram   History of antineoplastic chemotherapy    History of basal cell carcinoma  (BCC) excision    2019  s/p MOH's nose   History of GI bleed 05/2021   in setting post op cardiac cath PCI / stenting on blood thinnner/ 05-27-2021 Hg 7.9,  EGD done 05-30-2023  showed erosive gastrophy no active bleeding,  received x3 PRBC and IV iron  infusions   History of gout    History of kidney stones    History of palpitations    event monitor--- 11-21-2021  showed infrequent NSVT, ST   History of radiation therapy    pallitive radition to right lung and left axilla 04-30-2018  to 05-21-2018;   enlarging early stage LUL mass SBRT  01-09-2022  to 01-19-2022   Hypertension    Hypothyroidism (acquired)    followed by pcp   IDA (iron  deficiency anemia)    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   urologist--- dr gay/  radiation onologist--- dr patrcia;  dx 05/ 2024, gleason 4+3   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) [facility]) dx'd 04/17/18   to LN, infrahilar mass, lung nodule and lt axilla   Nephrolithiasis    per CT 02-21-2023 in epic bilateral renal calucli nonobstructive   OSA (obstructive sleep apnea)    sleep study 03-22-2021 in epic , mild osa AHI 8.2/hr  cpap intolerant   Port-A-Cath in place    Squamous cell carcinoma of lung, stage IV, 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   oncologist--- dr ozzie  radiation onologist-- dr dewey;  dx by needle bx;  Non-small cell lung cancer SCC w/ large right infrahilar mass in addition left upper lobe nodule & left axilla mass w/ left axilla lymph node;  treated w/ pallitive radiation , systemic chemo, maintenance immunotherapy discontinued due to intolerence 03/ 2022    ALLERGIES:  is allergic to iodinated contrast media, iohexol , and pravastatin .  MEDICATIONS:  Current Outpatient Medications  Medication Sig Dispense Refill   acetaminophen  (TYLENOL ) 500 MG tablet Take 1,000 mg by mouth every 6 (six) hours as needed for moderate pain.     albuterol  (PROVENTIL ) (2.5 MG/3ML) 0.083% nebulizer solution Take 3 mLs (2.5 mg total) by  nebulization every 6 (six) hours as needed for wheezing or shortness of breath. 75 mL 5   albuterol  (VENTOLIN  HFA) 108 (90 Base) MCG/ACT inhaler INHALE 2 PUFFS INTO THE LUNGS EVERY 6 HOURS AS NEEDED. 6.7 each 1   Artificial Tear Solution (SOOTHE XP OP) Place 1 drop into both eyes 2 (two) times daily.     b complex vitamins capsule Take 1 capsule by mouth daily.     carvedilol  (COREG ) 25 MG tablet TAKE 1 TABLET (25 MG TOTAL) BY MOUTH TWICE A DAY WITH MEALS 180 tablet 2   clopidogrel  (PLAVIX ) 75 MG tablet TAKE 1 TABLET BY MOUTH EVERY DAY 90 tablet 2   [employer] IRON  325 (65 Fe) MG tablet TAKE 1 TABLET BY MOUTH DAILY 90 tablet 1   Fluticasone -Umeclidin-Vilant (TRELEGY ELLIPTA ) 200-62.5-25 MCG/ACT AEPB Inhale 1 puff into the lungs daily. 1 each 3   levothyroxine  (SYNTHROID ) 137 MCG tablet TAKE 1 TABLET BY MOUTH DAILY. *DOSE CHANGE. REPEAT LABS IN MAY 90 tablet 0   methocarbamol (ROBAXIN) 500 MG  tablet Take 500 mg by mouth every 6 (six) hours as needed for muscle spasms.     Multiple Vitamin (MULTIVITAMIN) capsule Take 1 capsule by mouth daily.     MYRBETRIQ 50 MG TB24 tablet Take 50 mg by mouth daily.     nitroGLYCERIN  (NITROSTAT ) 0.4 MG SL tablet Place 1 tablet (0.4 mg total) under the tongue every 5 (five) minutes as needed for chest pain. 25 tablet 2   ondansetron  (ZOFRAN -ODT) 4 MG disintegrating tablet Take 1 tablet (4 mg total) by mouth every 8 (eight) hours as needed for nausea or vomiting. 20 tablet 0   oxyCODONE  (ROXICODONE ) 5 MG immediate release tablet Take 0.5 tablets (2.5 mg total) by mouth every 6 (six) hours as needed for severe pain (pain score 7-10). 4 tablet 0   pantoprazole  (PROTONIX ) 40 MG tablet TAKE 1 TABLET BY MOUTH EVERY DAY BEFORE BREAKFAST 90 tablet 3   Polyethylene Glycol 3350  (MIRALAX PO) Take by mouth daily.     PRALUENT  150 MG/ML SOAJ INJECT 150 MG INTO THE SKIN EVERY 14 (FOURTEEN) DAYS. 6 mL 3   Probiotic Product (ALIGN PO) Take 1 tablet by mouth 2 (two) times daily. gummy      tamsulosin  (FLOMAX ) 0.4 MG CAPS capsule Take 0.4 mg by mouth at bedtime.     umeclidinium bromide  (INCRUSE ELLIPTA ) 62.5 MCG/ACT AEPB Inhale 1 puff into the lungs daily. (Patient not taking: Reported on 05/26/2024) 30 each 5   No current facility-administered medications for this visit.    SURGICAL HISTORY:  Past Surgical History:  Procedure Laterality Date   BELPHAROPTOSIS REPAIR Right    1990s   CARDIAC CATHETERIZATION     CATARACT EXTRACTION W/ INTRAOCULAR LENS  IMPLANT, BILATERAL  2004   COLONOSCOPY N/A 11/03/2014   Procedure: COLONOSCOPY;  Surgeon: Claudis RAYMOND Rivet, MD;  Location: [facility];  Service: Endoscopy;  Laterality: N/A;  1225   COLONOSCOPY WITH PROPOFOL   02/13/2023   dr avram   CORONARY ATHERECTOMY N/A 05/26/2021   Procedure: CORONARY ATHERECTOMY;  Surgeon: Wendel Lurena POUR, MD;  Location: [facility] INVASIVE CV LAB;  Service: Cardiovascular;  Laterality: N/A;   CORONARY STENT INTERVENTION N/A 05/25/2021   Procedure: CORONARY STENT INTERVENTION;  Surgeon: Mady Bruckner, MD;  Location: [facility] INVASIVE CV LAB;  Service: Cardiovascular;  Laterality: N/A;   CORONARY STENT INTERVENTION N/A 05/26/2021   Procedure: CORONARY STENT INTERVENTION;  Surgeon: Wendel Lurena POUR, MD;  Location: [facility] INVASIVE CV LAB;  Service: Cardiovascular;  Laterality: N/A;   CORONARY ULTRASOUND/IVUS N/A 05/26/2021   Procedure: Intravascular Ultrasound/IVUS;  Surgeon: Wendel Lurena POUR, MD;  Location: [facility] INVASIVE CV LAB;  Service: Cardiovascular;  Laterality: N/A;   ELBOW BURSA SURGERY Left 11/2005   ESOPHAGOGASTRODUODENOSCOPY (EGD) WITH PROPOFOL  N/A 05/29/2021   Procedure: ESOPHAGOGASTRODUODENOSCOPY (EGD) WITH PROPOFOL ;  Surgeon: avram Lupita BRAVO, MD;  Location: [facility] ENDOSCOPY;  Service: Endoscopy;  Laterality: N/A;   EXTRACORPOREAL SHOCK WAVE LITHOTRIPSY Right 05/02/2015   @[facility]    GOLD SEED IMPLANT N/A 04/17/2023   Procedure: GOLD SEED IMPLANT;  Surgeon: Selma Donnice SAUNDERS, MD;  Location: [facility];   Service: Urology;  Laterality: N/A;   IR CV LINE INJECTION  08/24/2020   IR FLUORO GUIDED NEEDLE PLC ASPIRATION/INJECTION LOC  11/28/2020   IR IMAGING GUIDED PORT INSERTION  07/11/2018   KNEE ARTHROSCOPY W/ MENISCAL REPAIR Left 1995   LAPAROSCOPY N/A 05/18/2024   Procedure: LAPAROSCOPY, DIAGNOSTIC;  Surgeon: Stevie Herlene Righter, MD;  Location: [facility] ORS;  Service: General;  Laterality: N/A;  LYSIS OF ADHESIONS   LEFT HEART CATH AND CORONARY ANGIOGRAPHY N/A 05/25/2021   Procedure: LEFT HEART CATH AND CORONARY ANGIOGRAPHY;  Surgeon: Mady Bruckner, MD;  Location: [facility] INVASIVE CV LAB;  Service: Cardiovascular;  Laterality: N/A;   OLECRANON BURSECTOMY Left 06/06/2006   @[facility]  by dr forbes. deward;   revision radial left elbow   SPACE OAR INSTILLATION N/A 04/17/2023   Procedure: SPACE OAR INSTILLATION;  Surgeon: Selma Donnice SAUNDERS, MD;  Location[facility];  Service: Urology;  Laterality: N/A;   TEMPORARY PACEMAKER N/A 05/26/2021   Procedure: TEMPORARY PACEMAKER;  Surgeon: Wendel Lurena POUR, MD;  Location: [facility] INVASIVE CV LAB;  Service: Cardiovascular;  Laterality: N/A;   TONSILLECTOMY     child   TRANSURETHRAL RESECTION OF BLADDER TUMOR  02/17/2010   @[facility]  by dr matilda   VIDEO BRONCHOSCOPY Bilateral 05/09/2018   Procedure: VIDEO BRONCHOSCOPY WITH FLUORO;  Surgeon: Alaine Vicenta NOVAK, MD;  Location: [facility] ENDOSCOPY;  Service: Cardiopulmonary;  Laterality: Bilateral;    REVIEW OF SYSTEMS:  Constitutional: positive for fatigue Eyes: negative Ears, nose, mouth, throat, and face: negative Respiratory: positive for dyspnea on exertion and wheezing Cardiovascular: negative Gastrointestinal: negative Genitourinary:negative Integument/breast: negative Hematologic/lymphatic: negative Musculoskeletal:negative Neurological: negative Behavioral/Psych: negative Endocrine: negative Allergic/Immunologic: negative   PHYSICAL EXAMINATION: General appearance: alert, cooperative, fatigued, and no  distress Head: Normocephalic, without obvious abnormality, atraumatic Neck: no adenopathy, no JVD, supple, symmetrical, trachea midline, and thyroid  not enlarged, symmetric, no tenderness/mass/nodules Lymph nodes: Cervical, supraclavicular, and axillary nodes normal. Resp: clear to auscultation bilaterally Back: symmetric, no curvature. ROM normal. No CVA tenderness. Cardio: regular rate and rhythm, S1, S2 normal, no murmur, click, rub or gallop GI: soft, non-tender; bowel sounds normal; no masses,  no organomegaly Extremities: extremities normal, atraumatic, no cyanosis or edema Neurologic: Alert and oriented X 3, normal strength and tone. Normal symmetric reflexes. Normal coordination and gait  ECOG PERFORMANCE STATUS: 1 - Symptomatic but completely ambulatory  Blood pressure 116/67, pulse 87, temperature 98.2 F (36.8 C), temperature source Temporal, resp. rate 16, height 6' 2 (1.88 m), weight 252 lb (114.3 kg), SpO2 99%.  LABORATORY DATA: Lab Results  Component Value Date   WBC 6.8 05/25/2024   HGB 11.8 (L) 05/25/2024   HCT 33.9 (L) 05/25/2024   MCV 96.9 05/25/2024   PLT 205 05/25/2024      Chemistry      Component Value Date/Time   NA 141 05/25/2024 1208   NA 142 05/14/2024 1522   K 3.8 05/25/2024 1208   CL 109 05/25/2024 1208   CO2 26 05/25/2024 1208   BUN 10 05/25/2024 1208   BUN 33 (H) 05/14/2024 1522   CREATININE 1.16 05/25/2024 1208      Component Value Date/Time   CALCIUM 8.3 (L) 05/25/2024 1208   ALKPHOS 60 05/25/2024 1208   AST 20 05/25/2024 1208   ALT 18 05/25/2024 1208   BILITOT 0.4 05/25/2024 1208       RADIOGRAPHIC STUDIES: CT CHEST ABDOMEN PELVIS WO CONTRAST Result Date: 05/28/2024 CLINICAL DATA:  Non-small-cell lung cancer restaging additional history of prostate cancer * Tracking Code: BO * EXAM: CT CHEST, ABDOMEN AND PELVIS WITHOUT CONTRAST TECHNIQUE: Multidetector CT imaging of the chest, abdomen and pelvis was performed following the standard  protocol without IV contrast. RADIATION DOSE REDUCTION: This exam was performed according to the departmental dose-optimization program which includes automated exposure control, adjustment of the mA and/or kV according to patient size and/or use of iterative reconstruction technique. COMPARISON:  CT chest,  11/22/2023 CT abdomen pelvis, 05/14/2024 FINDINGS: CT CHEST FINDINGS Cardiovascular: Right chest port catheter. Normal heart size. Three-vessel coronary artery calcifications. No pericardial effusion. Mediastinum/Nodes: No enlarged mediastinal, hilar, or axillary lymph nodes. Thyroid  gland, trachea, and esophagus demonstrate no significant findings. Lungs/Pleura: Unchanged post treatment appearance of the chest with bilateral perihilar radiation fibrosis and consolidation. When measured with similar technique, unchanged treated mass in the lingula measuring 3.2 x 3.2 cm (series 6, image 66). No pleural effusion or pneumothorax. Musculoskeletal: No chest wall abnormality. No acute osseous findings. CT ABDOMEN PELVIS FINDINGS Hepatobiliary: No solid liver abnormality is seen. No gallstones, gallbladder wall thickening, or biliary dilatation. Pancreas: Unremarkable. No pancreatic ductal dilatation or surrounding inflammatory changes. Spleen: Normal in size without significant abnormality. Adrenals/Urinary Tract: Adrenal glands are unremarkable. Multiple bilateral nonobstructive renal calculi. No ureteral calculi or hydronephrosis. Mild diffuse fat stranding about the bladder wall (series 9, image 113). Stomach/Bowel: Stomach is within normal limits. Appendix appears normal. No evidence of bowel wall thickening, distention, or inflammatory changes. Descending and sigmoid diverticulosis. Vascular/Lymphatic: Aortic atherosclerosis. No enlarged abdominal or pelvic lymph nodes. Reproductive: Prostate brachytherapy. Other: No abdominal wall hernia or abnormality. No ascites. Musculoskeletal: No acute osseous findings.  IMPRESSION: 1. Unchanged post treatment appearance of the chest with bilateral perihilar radiation fibrosis and consolidation. Unchanged treated mass in the lingula. 2. No noncontrast evidence of lymphadenopathy or metastatic disease in the chest, abdomen, or pelvis. 3. Mild diffuse fat stranding about the bladder wall, probably related to local radiation therapy in the setting of prostate brachytherapy. Correlate with urinalysis if indicated by clinical signs and symptoms of cystitis 4. Prostate brachytherapy. 5. Nonobstructive bilateral nephrolithiasis. 6. Coronary artery disease. Aortic Atherosclerosis (ICD10-I70.0). Electronically Signed   By: Marolyn JONETTA Jaksch M.D.   On: 05/28/2024 10:22   DG Abd Portable 1V Result Date: 05/19/2024 CLINICAL DATA:  [id number] SBO (small bowel obstruction) (HCC) [id number] EXAM: PORTABLE ABDOMEN - 1 VIEW COMPARISON:  May 17, 2024 FINDINGS: Esophagogastric tube terminates in the region of the stomach. Couple of mildly dilated segments of small bowel persist in the left hemiabdomen. Gas throughout the colon.No pneumoperitoneum. No organomegaly or radiopaque calculi. Pelvic phleboliths. No acute fracture or destructive lesion. Multilevel thoracic osteophytosis. IMPRESSION: Similar dilation of a couple of gas-filled segments of small bowel in the left hemiabdomen. Gas throughout the colon. Well-positioned esophagogastric tube. Electronically Signed   By: Rogelia Myers M.D.   On: 05/19/2024 09:00   DG Abd 2 Views Result Date: 05/17/2024 CLINICAL DATA:  Small-bowel obstruction EXAM: ABDOMEN - 2 VIEW COMPARISON:  Abdominal radiograph dated 05/16/2024 FINDINGS: Gastric/enteric tube tip projects over the stomach. Similar gas-filled mildly dilated bowel loops remain in the left hemiabdomen. Prostate fiducials project over the lower midline pelvis. IMPRESSION: Similar gas-filled mildly dilated bowel loops remain in the left hemiabdomen. Electronically Signed   By: Limin  Xu M.D.   On:  05/17/2024 10:50   DG Abd 2 Views Result Date: 05/16/2024 CLINICAL DATA:  Small-bowel obstruction EXAM: ABDOMEN - 2 VIEW COMPARISON:  Abdominal radiograph dated 05/15/2024 FINDINGS: Gastric/enteric tube tip projects over the stomach. Partially imaged right chest wall port tip projects over the superior cavoatrial junction. Partially imaged prostate fiducials. Decreased number of gas-filled dilated bowel loops in the left hemiabdomen. IMPRESSION: Decreased number of gas-filled dilated bowel loops in the left hemiabdomen. Electronically Signed   By: Limin  Xu M.D.   On: 05/16/2024 10:10   DG Abd Portable 1 View Result Date: 05/15/2024 CLINICAL DATA:  Enteric catheter placement EXAM: PORTABLE ABDOMEN -  1 VIEW COMPARISON:  05/15/2024 FINDINGS: Supine frontal view of the abdomen and pelvis excludes the bilateral flanks, right hemidiaphragm, and pubic symphysis by collimation. Enteric catheter tip and side port project over the gastric fundus. Continue distension the stomach and small bowel. No masses or abnormal calcifications. IMPRESSION: 1. Enteric catheter tip and side port projecting over the gastric fundus. 2. Persistent gaseous distention of the stomach and small bowel consistent with obstruction. Electronically Signed   By: Ozell Daring M.D.   On: 05/15/2024 20:03   DG Abd Portable 1 View Result Date: 05/15/2024 CLINICAL DATA:  NG tube placement EXAM: PORTABLE ABDOMEN - 1 VIEW COMPARISON:  05/02/2015 FINDINGS: NG tube tip is in the right lower lobe airway. Recommend complete removal and replacement. Mild gaseous distention of the stomach. IMPRESSION: NG tube tip in the right lower lobe airway. Recommend complete removal and replacement. These results were called by telephone at the time of interpretation on 05/15/2024 at 7:39 pm to provider CURTISTINE DAWN , who verbally acknowledged these results. Electronically Signed   By: Franky Crease M.D.   On: 05/15/2024 19:39   CT ABDOMEN PELVIS WO CONTRAST Result  Date: 05/14/2024 CLINICAL DATA:  Bowel obstruction suspected. Nausea and vomiting. History of lung cancer and prostate cancer. EXAM: CT ABDOMEN AND PELVIS WITHOUT CONTRAST TECHNIQUE: Multidetector CT imaging of the abdomen and pelvis was performed following the standard protocol without IV contrast. RADIATION DOSE REDUCTION: This exam was performed according to the departmental dose-optimization program which includes automated exposure control, adjustment of the mA and/or kV according to patient size and/or use of iterative reconstruction technique. COMPARISON:  CT abdomen and pelvis 08/28/2023. FINDINGS: Lower chest: Post treatment changes in the right lung are partially imaged and unchanged. No acute findings. Hepatobiliary: No focal liver abnormality is seen. No gallstones, gallbladder wall thickening, or biliary dilatation. Pancreas: Unremarkable. No pancreatic ductal dilatation or surrounding inflammatory changes. Spleen: Normal in size without focal abnormality. Adrenals/Urinary Tract: There are punctate nonobstructing bilateral renal calculi measuring up to 7 mm. There is no hydronephrosis. There is a cyst in the superior pole of the left kidney measuring 1 cm. The bladder is completely decompressed and not well evaluated. There some questionable mild inflammation surrounding the bladder. The adrenal glands appear normal. Stomach/Bowel: There are numerous dilated proximal small bowel loops with air-fluid levels measuring up to 5 cm. Transition point is seen in the central abdomen image 2/60. Distal small bowel is completely decompressed. There is no pneumatosis or free air. The stomach is dilated with air-fluid level. Colon is nondilated. The appendix is within normal limits. There is sigmoid colon diverticulosis. Vascular/Lymphatic: Aortic atherosclerosis. No enlarged abdominal or pelvic lymph nodes. Reproductive: Prostate radiotherapy seeds are present. Other: No abdominal wall hernia or abnormality. No  abdominopelvic ascites. Musculoskeletal: Degenerative changes affect the spine. No acute osseous abnormality. IMPRESSION: 1. High-grade small bowel obstruction with transition point in the central abdomen. 2. Nonobstructing bilateral renal calculi. 3. Questionable mild inflammation surrounding the bladder. Correlate clinically for cystitis. 4. Aortic atherosclerosis. Aortic Atherosclerosis (ICD10-I70.0). Electronically Signed   By: Greig Pique M.D.   On: 05/14/2024 17:31     ASSESSMENT AND PLAN: This is a very pleasant 78 years old white male with very light most smoking history recently diagnosed with stage IV (T3, N0, M1c) non-small cell lung cancer, squamous cell carcinoma based on the biopsy from the left axillary mass. He status post 4 cycles of induction systemic chemotherapy with carboplatin , paclitaxel  and Keytruda  with partial response.  The patient is currently on maintenance treatment with single agent Keytruda  status post 41 cycles.  He also received palliative radiotherapy to the enlarging left upper lobe lung mass under the care of Dr. Dewey.  The patient has been on observation for several years now. He had repeat CT scan of the chest, abdomen and pelvis performed recently.  I personally independently reviewed the scan and discussed the result with the patient today.  His scan showed no concerning finding for disease progression. Assessment and Plan Assessment & Plan Stage IV non-small cell lung cancer Stage IV non-small cell lung cancer, initially diagnosed in August 2019, treated with palliative radiotherapy and systemic chemotherapy with carboplatin , followed by Keytruda  every three weeks for four cycles, and then maintenance Keytruda  for forty-one cycles. Currently under surveillance with no concerning findings for disease progression on recent CT scan of the chest, abdomen, and pelvis. The disease appears static with no new lesions or progression. - Continue surveillance with  follow-up appointments every six months. - Encourage participation in lung cancer awareness events for motivation and support.  COPD COPD managed with Trelegy, providing adequate control during the day. Requires additional albuterol  in the evening for symptom relief. Follow-up with pulmonologist planned to reassess medication regimen. - Continue Trelegy for daytime symptom control. - Use albuterol  as needed for evening symptoms. - Follow up with pulmonologist to reassess and adjust treatment as necessary. For the history of prostate adenocarcinoma, he is followed by Dr. Selma and Dr. Patrcia. For the hypothyroidism he is currently on levothyroxine  managed by his primary care physician. For the history of coronary artery disease as well as the aortic aneurysm, he is followed by Dr. Lavona. The patient was advised to call immediately if he has any concerning symptoms in the interval. The patient voices understanding of current disease status and treatment options and is in agreement with the current care plan. All questions were answered. The patient knows to call the clinic with any problems, questions or concerns. We can certainly see the patient much sooner if necessary.  Disclaimer: This note was dictated with voice recognition software. Similar sounding words can inadvertently be transcribed and may not be corrected upon review.

## 2024-06-06 ENCOUNTER — Other Ambulatory Visit: Payer: Self-pay | Admitting: Pulmonary Disease

## 2024-06-08 DIAGNOSIS — M47816 Spondylosis without myelopathy or radiculopathy, lumbar region: Secondary | ICD-10-CM | POA: Diagnosis not present

## 2024-06-08 DIAGNOSIS — M545 Low back pain, unspecified: Secondary | ICD-10-CM | POA: Diagnosis not present

## 2024-06-08 DIAGNOSIS — M533 Sacrococcygeal disorders, not elsewhere classified: Secondary | ICD-10-CM | POA: Diagnosis not present

## 2024-06-12 ENCOUNTER — Ambulatory Visit (INDEPENDENT_AMBULATORY_CARE_PROVIDER_SITE_OTHER)

## 2024-06-12 ENCOUNTER — Ambulatory Visit (INDEPENDENT_AMBULATORY_CARE_PROVIDER_SITE_OTHER): Admitting: Family Medicine

## 2024-06-12 ENCOUNTER — Ambulatory Visit: Payer: Self-pay | Admitting: Family Medicine

## 2024-06-12 ENCOUNTER — Encounter: Payer: Self-pay | Admitting: Family Medicine

## 2024-06-12 VITALS — BP 116/74 | HR 87 | Temp 97.1°F | Ht 74.0 in | Wt 249.5 lb

## 2024-06-12 DIAGNOSIS — G8929 Other chronic pain: Secondary | ICD-10-CM | POA: Diagnosis not present

## 2024-06-12 DIAGNOSIS — N3942 Incontinence without sensory awareness: Secondary | ICD-10-CM | POA: Diagnosis not present

## 2024-06-12 DIAGNOSIS — J441 Chronic obstructive pulmonary disease with (acute) exacerbation: Secondary | ICD-10-CM

## 2024-06-12 DIAGNOSIS — C3491 Malignant neoplasm of unspecified part of right bronchus or lung: Secondary | ICD-10-CM | POA: Diagnosis not present

## 2024-06-12 DIAGNOSIS — I2510 Atherosclerotic heart disease of native coronary artery without angina pectoris: Secondary | ICD-10-CM

## 2024-06-12 DIAGNOSIS — Z85118 Personal history of other malignant neoplasm of bronchus and lung: Secondary | ICD-10-CM | POA: Diagnosis not present

## 2024-06-12 DIAGNOSIS — Z8719 Personal history of other diseases of the digestive system: Secondary | ICD-10-CM | POA: Diagnosis not present

## 2024-06-12 DIAGNOSIS — E66811 Obesity, class 1: Secondary | ICD-10-CM | POA: Diagnosis not present

## 2024-06-12 DIAGNOSIS — Z09 Encounter for follow-up examination after completed treatment for conditions other than malignant neoplasm: Secondary | ICD-10-CM | POA: Diagnosis not present

## 2024-06-12 DIAGNOSIS — M47816 Spondylosis without myelopathy or radiculopathy, lumbar region: Secondary | ICD-10-CM

## 2024-06-12 DIAGNOSIS — M533 Sacrococcygeal disorders, not elsewhere classified: Secondary | ICD-10-CM | POA: Diagnosis not present

## 2024-06-12 DIAGNOSIS — R059 Cough, unspecified: Secondary | ICD-10-CM | POA: Diagnosis not present

## 2024-06-12 DIAGNOSIS — R051 Acute cough: Secondary | ICD-10-CM | POA: Diagnosis not present

## 2024-06-12 LAB — VERITOR FLU A/B WAIVED
Influenza A: NEGATIVE
Influenza B: NEGATIVE

## 2024-06-12 MED ORDER — PREDNISONE 20 MG PO TABS: ORAL_TABLET | ORAL | 0 refills | Status: DC

## 2024-06-12 MED ORDER — DOXYCYCLINE HYCLATE 100 MG PO TABS: 100.0000 mg | ORAL_TABLET | Freq: Two times a day (BID) | ORAL | 0 refills | Status: AC

## 2024-06-12 NOTE — Addendum Note (Signed)
 Addended by: JOLINDA NORENE HERO on: 06/12/2024 03:30 PM   Modules accepted: Orders

## 2024-06-12 NOTE — Progress Notes (Signed)
 Subjective: CC: Hospital discharge follow-up for small bowel obstruction PCP: Jolinda Norene HERO, DO Travis Padilla Travis Padilla is a 78 y.o. male presenting to clinic today for: History of Present Illness   Patient was admitted for dehydration for suspected small bowel obstruction (no overt obstruction found but there was adhesions which were treated with surgery) with AKI.  This was a due to increased dose of Wegovy , which was being prescribed for both weight and coronary artery disease.  He had laparoscopic lysis of adhesions with Dr. Patrici.  He was discharged on 05/21/2024.  His renal function was within his baseline at discharge.  Since that time he has seen his oncologist for stage IV lung cancer and has seen his back specialist for lumbar spondylosis and left-sided SI pain.  At his visit with the orthopedist, they recommended physical therapy and if no significant improvement after physical therapy joint injection but at this time were not planning for surgery given current presentation.  He reports that over the last few days he has developed a nonproductive cough that is harsh.  He has noticed that overall he seems to be not as controlled with his breathing on the Trelegy 200 mcg.  Has not followed up with pulmonology but that is on his to do list.  He denies any fevers.  No hemoptysis.  He actually got a good checkup with his hematologist/oncologist recently.  Utilizing Flonase  and Mucinex .  He is interested in pursuing ongoing weight loss, particularly because he has low back pain and history of cardiac disease.  Would like referral to nutritionist maybe to discuss weight management  Additionally, he reports some urinary incontinence and does not since need to go sometimes such that he leaks.  He has history of prostate cancer and is under the care of urology and treated with both Flomax  and Myrbetriq.  He has not mentioned this symptom to his spinal specialist yet   ROS: Per  HPI  Allergies  Allergen Reactions   Iodinated Contrast Media Hives and Rash    Pt presented to CT after completed 13 hr premedication regimen and still had an minor rash/hives.    Iohexol  Rash    CT scan contrast caused rash and itching    does fine with premeds.   Pravastatin      Numbness in feet    Past Medical History:  Diagnosis Date   Arthritis    Benign localized prostatic hyperplasia with lower urinary tract symptoms (LUTS)    CAD (coronary artery disease) 2013   cardiologist--- dr lavona;   08-26-2012 cath for poss ischemia on NUC-- moderate nonob CAD, aggressive medical manage;  staged cardiac cath 09/ 2022 PCI, DES x1 to mLAD (80%) ;   arthrectomy and DES prox-mid RCA Mild nonobstructive plaque in cath 2013   Chronic constipation    COPD with emphysema [facility])    pulmonology--- dr b. icard/ tammy parrett NP;   no oxygen, no daily inhaler   Diverticulosis of colon    w/ hx diverticulitis   Dyslipidemia    GAD (generalized anxiety disorder)    GERD (gastroesophageal reflux disease)    Hemorrhoids    internal and external   History of adenomatous polyp of colon    followed by dr avram   History of antineoplastic chemotherapy    History of basal cell carcinoma (BCC) excision    2019  s/p MOH's nose   History of GI bleed 05/2021   in setting post op cardiac cath PCI / stenting  on blood thinnner/ 05-27-2021 Hg 7.9,  EGD done 05-30-2023  showed erosive gastrophy no active bleeding,  received x3 PRBC and IV iron  infusions   History of gout    History of kidney stones    History of palpitations    event monitor--- 11-21-2021  showed infrequent NSVT, ST   History of radiation therapy    pallitive radition to right lung and left axilla 04-30-2018  to 05-21-2018;   enlarging early stage LUL mass SBRT  01-09-2022  to 01-19-2022   Hypertension    Hypothyroidism (acquired)    followed by pcp   IDA (iron  deficiency anemia)    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    urologist--- dr gay/  radiation onologist--- dr patrcia;  dx 05/ 2024, gleason 4+3   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) [facility]) dx'd 04/17/18   to LN, infrahilar mass, lung nodule and lt axilla   Nephrolithiasis    per CT 02-21-2023 in epic bilateral renal calucli nonobstructive   OSA (obstructive sleep apnea)    sleep study 03-22-2021 in epic , mild osa AHI 8.2/hr  cpap intolerant   Port-A-Cath in place    Squamous cell carcinoma of lung, stage IV, 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   oncologist--- dr ozzie  radiation onologist-- dr dewey;  dx by needle bx;  Non-small cell lung cancer SCC w/ large right infrahilar mass in addition left upper lobe nodule & left axilla mass w/ left axilla lymph node;  treated w/ pallitive radiation , systemic chemo, maintenance immunotherapy discontinued due to intolerence 03/ 2022    Current Outpatient Medications:    acetaminophen  (TYLENOL ) 500 MG tablet, Take 1,000 mg by mouth every 6 (six) hours as needed for moderate pain., Disp: , Rfl:    albuterol  (PROVENTIL ) (2.5 MG/3ML) 0.083% nebulizer solution, Take 3 mLs (2.5 mg total) by nebulization every 6 (six) hours as needed for wheezing or shortness of breath., Disp: 75 mL, Rfl: 5   albuterol  (VENTOLIN  HFA) 108 (90 Base) MCG/ACT inhaler, INHALE 2 PUFFS INTO THE LUNGS EVERY 6 HOURS AS NEEDED., Disp: 6.7 each, Rfl: 1   Artificial Tear Solution (SOOTHE XP OP), Place 1 drop into both eyes 2 (two) times daily., Disp: , Rfl:    b complex vitamins capsule, Take 1 capsule by mouth daily., Disp: , Rfl:    carvedilol  (COREG ) 25 MG tablet, TAKE 1 TABLET (25 MG TOTAL) BY MOUTH TWICE A DAY WITH MEALS, Disp: 180 tablet, Rfl: 2   clopidogrel  (PLAVIX ) 75 MG tablet, TAKE 1 TABLET BY MOUTH EVERY DAY, Disp: 90 tablet, Rfl: 2   [employer] IRON  325 (65 Fe) MG tablet, TAKE 1 TABLET BY MOUTH DAILY, Disp: 90 tablet, Rfl: 1   levothyroxine  (SYNTHROID ) 137 MCG tablet, TAKE 1 TABLET BY MOUTH DAILY. *DOSE CHANGE. REPEAT LABS IN MAY,  Disp: 90 tablet, Rfl: 0   methocarbamol (ROBAXIN) 500 MG tablet, Take 500 mg by mouth every 6 (six) hours as needed for muscle spasms., Disp: , Rfl:    Multiple Vitamin (MULTIVITAMIN) capsule, Take 1 capsule by mouth daily., Disp: , Rfl:    MYRBETRIQ 50 MG TB24 tablet, Take 50 mg by mouth daily., Disp: , Rfl:    nitroGLYCERIN  (NITROSTAT ) 0.4 MG SL tablet, Place 1 tablet (0.4 mg total) under the tongue every 5 (five) minutes as needed for chest pain., Disp: 25 tablet, Rfl: 2   ondansetron  (ZOFRAN -ODT) 4 MG disintegrating tablet, Take 1 tablet (4 mg total) by mouth every 8 (eight) hours as needed  for nausea or vomiting., Disp: 20 tablet, Rfl: 0   oxyCODONE  (ROXICODONE ) 5 MG immediate release tablet, Take 0.5 tablets (2.5 mg total) by mouth every 6 (six) hours as needed for severe pain (pain score 7-10)., Disp: 4 tablet, Rfl: 0   pantoprazole  (PROTONIX ) 40 MG tablet, TAKE 1 TABLET BY MOUTH EVERY DAY BEFORE BREAKFAST, Disp: 90 tablet, Rfl: 3   Polyethylene Glycol 3350  (MIRALAX PO), Take by mouth daily., Disp: , Rfl:    PRALUENT  150 MG/ML SOAJ, INJECT 150 MG INTO THE SKIN EVERY 14 (FOURTEEN) DAYS., Disp: 6 mL, Rfl: 3   Probiotic Product (ALIGN PO), Take 1 tablet by mouth 2 (two) times daily. gummy, Disp: , Rfl:    tamsulosin  (FLOMAX ) 0.4 MG CAPS capsule, Take 0.4 mg by mouth at bedtime., Disp: , Rfl:    TRELEGY ELLIPTA  200-62.5-25 MCG/ACT AEPB, INHALE 1 PUFF INTO THE LUNGS EVERY DAY, Disp: 60 each, Rfl: 3   umeclidinium bromide  (INCRUSE ELLIPTA ) 62.5 MCG/ACT AEPB, Inhale 1 puff into the lungs daily. (Patient not taking: Reported on 05/26/2024), Disp: 30 each, Rfl: 5 Social History   Socioeconomic History   Marital status: Widowed    Spouse name: Not on file   Number of children: 1   Years of education: Not on file   Highest education level: Not on file  Occupational History   Occupation: Emergency planning/management officer  Tobacco Use   Smoking status: Former    Current packs/day: 0.00    Average packs/day: 0.2  packs/day for 2.0 years (0.4 ttl pk-yrs)    Types: Cigarettes    Start date: 10/01/1968    Quit date: 63    Years since quitting: 53.7   Smokeless tobacco: Never  Vaping Use   Vaping status: Never Used  Substance and Sexual Activity   Alcohol use: Not Currently    Comment: occasional   Drug use: Never   Sexual activity: Not on file  Other Topics Concern   Not on file  Social History Narrative   Unable to ask intimate partner violence questions, wife present.    Pt's wife died in 09/05/21.   Social Drivers of Corporate investment banker Strain: Low Risk  (11/25/2023)   Overall Financial Resource Strain (CARDIA)    Difficulty of Paying Living Expenses: Not hard at all  Food Insecurity: No Food Insecurity (05/26/2024)   Hunger Vital Sign    Worried About Running Out of Food in the Last Year: Never true    Ran Out of Food in the Last Year: Never true  Transportation Needs: No Transportation Needs (05/26/2024)   PRAPARE - Administrator, Civil Service (Medical): No    Lack of Transportation (Non-Medical): No  Physical Activity: Sufficiently Active (11/25/2023)   Exercise Vital Sign    Days of Exercise per Week: 5 days    Minutes of Exercise per Session: 30 min  Stress: No Stress Concern Present (11/25/2023)   [employer] of Occupational Health - Occupational Stress Questionnaire    Feeling of Stress : Not at all  Social Connections: Moderately Isolated (05/15/2024)   Social Connection and Isolation Panel    Frequency of Communication with Friends and Family: Three times a week    Frequency of Social Gatherings with Friends and Family: More than three times a week    Attends Religious Services: Never    Database administrator or Organizations: Yes    Attends Engineer, structural: More than 4 times per year  Marital Status: Widowed  Intimate Partner Violence: Not At Risk (05/26/2024)   Humiliation, Afraid, Rape, and Kick questionnaire    Fear of  Current or Ex-Partner: No    Emotionally Abused: No    Physically Abused: No    Sexually Abused: No   Family History  Problem Relation Age of Onset   Heart failure Mother    Parkinson's disease Mother    Heart attack Father    Healthy Sister    Skin cancer Sister    Healthy Sister    Healthy Sister    Neuropathy Brother    COPD Brother    Epilepsy Brother    Colon cancer Neg Hx    Pancreatic cancer Neg Hx    Prostate cancer Neg Hx    Rectal cancer Neg Hx    Stomach cancer Neg Hx     Objective: Office vital signs reviewed. BP 116/74   Pulse 87   Temp (!) 97.1 F (36.2 C)   Ht 6' 2 (1.88 m)   Wt 249 lb 8 oz (113.2 kg)   SpO2 96%   BMI 32.03 kg/m   Physical Examination:  General: Awake, alert, chronically ill-appearing but nontoxic male, No acute distress HEENT: Normal    Neck: No masses palpated. No lymphadenopathy    Ears: Tympanic membranes intact, normal light reflex, no erythema, no bulging    Eyes: PERRLA, extraocular membranes intact, sclera white    Nose: nasal turbinates moist, clear nasal discharge    Throat: moist mucus membranes, no erythema, no tonsillar exudate.  Airway is patent Cardio: regular rate and rhythm, S1S2 heard, no murmurs appreciated Pulm: Decreased breath sounds in the right lower lung field compared to left but otherwise clear to auscultation bilaterally with no wheezes, rhonchi or rales.  He has normal work of breathing on room air with no observed coughing MSK: Ambulating independently  Assessment/ Plan: 78 y.o. male  Assessment & Plan   History of small bowel obstruction  COPD exacerbation (HCC) - Plan: Novel Coronavirus, NAA ([facility]), Veritor Flu A/B Waived, predniSONE  (DELTASONE ) 20 MG tablet, doxycycline  (VIBRA -TABS) 100 MG tablet, DG Chest 2 [facility] discharge follow-up  Chronic left SI joint pain  Lumbar spondylosis  Urinary incontinence without sensory awareness  Stage 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  (HCC) - Plan: DG Chest 2 View  Coronary artery disease involving native coronary artery of native heart without angina pectoris - Plan: Amb ref to Medical Nutrition Therapy-MNT  Obesity, Class I, BMI 30-34.9 - Plan: Amb ref to Medical Nutrition Therapy-MNT  I reviewed his hospital course, discharge instructions and outpatient follow-up visits.  He appears to have a possible COPD exacerbation.  When empirically treat him with oral antibiotics and steroids.  Flu and COVID testing performed but flu testing was negative.  I advised him to follow-up with pulmonology for ongoing emphysema issues despite high-dose Trelegy.  Wonder if he might be a good candidate for [employer].  Plain films ordered.  Continue to follow-up with spinal specialist for chronic back pain.  We discussed need to discuss urinary incontinence without sensory awareness with his back specialist and urologist.  As he is having no urinary retention I have a low suspicion for cauda equina syndrome.  Referral to nutrition for ongoing assistance with dietary management.   No orders of the defined types were placed in this encounter.  No orders of the defined types were placed in this encounter.    Norene CHRISTELLA Fielding, DO [facility] [phone]

## 2024-06-13 LAB — NOVEL CORONAVIRUS, NAA: SARS-CoV-2, NAA: NOT DETECTED

## 2024-06-16 ENCOUNTER — Ambulatory Visit: Admitting: Family Medicine

## 2024-06-18 DIAGNOSIS — M545 Low back pain, unspecified: Secondary | ICD-10-CM | POA: Diagnosis not present

## 2024-06-18 DIAGNOSIS — M6281 Muscle weakness (generalized): Secondary | ICD-10-CM | POA: Diagnosis not present

## 2024-06-23 ENCOUNTER — Other Ambulatory Visit: Payer: Self-pay | Admitting: Family Medicine

## 2024-06-24 ENCOUNTER — Ambulatory Visit: Admitting: Adult Health

## 2024-06-25 DIAGNOSIS — M6281 Muscle weakness (generalized): Secondary | ICD-10-CM | POA: Diagnosis not present

## 2024-06-25 DIAGNOSIS — M545 Low back pain, unspecified: Secondary | ICD-10-CM | POA: Diagnosis not present

## 2024-07-02 DIAGNOSIS — M6281 Muscle weakness (generalized): Secondary | ICD-10-CM | POA: Diagnosis not present

## 2024-07-02 DIAGNOSIS — M545 Low back pain, unspecified: Secondary | ICD-10-CM | POA: Diagnosis not present

## 2024-07-03 ENCOUNTER — Other Ambulatory Visit: Payer: Self-pay | Admitting: Cardiology

## 2024-07-04 ENCOUNTER — Other Ambulatory Visit: Payer: Self-pay | Admitting: Cardiology

## 2024-07-08 DIAGNOSIS — M6281 Muscle weakness (generalized): Secondary | ICD-10-CM | POA: Diagnosis not present

## 2024-07-08 DIAGNOSIS — M545 Low back pain, unspecified: Secondary | ICD-10-CM | POA: Diagnosis not present

## 2024-07-09 ENCOUNTER — Other Ambulatory Visit: Payer: Self-pay | Admitting: Family Medicine

## 2024-07-09 DIAGNOSIS — E039 Hypothyroidism, unspecified: Secondary | ICD-10-CM

## 2024-07-13 ENCOUNTER — Encounter: Payer: Self-pay | Admitting: Family Medicine

## 2024-07-13 ENCOUNTER — Ambulatory Visit (INDEPENDENT_AMBULATORY_CARE_PROVIDER_SITE_OTHER): Admitting: Family Medicine

## 2024-07-13 VITALS — BP 122/81 | HR 88 | Temp 97.3°F | Ht 74.0 in | Wt 248.1 lb

## 2024-07-13 DIAGNOSIS — Z23 Encounter for immunization: Secondary | ICD-10-CM

## 2024-07-13 DIAGNOSIS — J00 Acute nasopharyngitis [common cold]: Secondary | ICD-10-CM

## 2024-07-13 DIAGNOSIS — R053 Chronic cough: Secondary | ICD-10-CM | POA: Diagnosis not present

## 2024-07-13 MED ORDER — DESLORATADINE 5 MG PO TABS: 5.0000 mg | ORAL_TABLET | Freq: Every day | ORAL | 3 refills | Status: AC | PRN

## 2024-07-13 MED ORDER — AZELASTINE HCL 0.1 % NA SOLN: 1.0000 | Freq: Two times a day (BID) | NASAL | 12 refills | Status: AC | PRN

## 2024-07-13 MED ORDER — BENZONATATE 200 MG PO CAPS: 200.0000 mg | ORAL_CAPSULE | Freq: Two times a day (BID) | ORAL | 0 refills | Status: AC | PRN

## 2024-07-13 NOTE — Progress Notes (Signed)
 Subjective: RR:[id number] cough PCP: Jolinda Norene HERO, DO YEP:Gnyw HERO Greenblatt is a 78 y.o. male presenting to clinic today for:  Reports persistent cough with some postnasal drainage.  He has been utilizing Flonase  and Mucinex  but this really has not improved symptoms.  He really did not notice any substantial improvement on the doxycycline  and prednisone  burst.  He reports no fevers but does feel like he gets flushing every once in a while and even some night sweats.  He has set up an appointment with Madelin Sumner but it is not for another several weeks.  He does not report any hemoptysis but reports some overall malaise he is compliant with all medications as prescribed.   ROS: Per HPI  Allergies  Allergen Reactions   Iodinated Contrast Media Hives and Rash    Pt presented to CT after completed 13 hr premedication regimen and still had an minor rash/hives.    Iohexol  Rash    CT scan contrast caused rash and itching    does fine with premeds.   Pravastatin      Numbness in feet    Past Medical History:  Diagnosis Date   Arthritis    Benign localized prostatic hyperplasia with lower urinary tract symptoms (LUTS)    CAD (coronary artery disease) 2013   cardiologist--- dr lavona;   08-26-2012 cath for poss ischemia on NUC-- moderate nonob CAD, aggressive medical manage;  staged cardiac cath 09/ 2022 PCI, DES x1 to mLAD (80%) ;   arthrectomy and DES prox-mid RCA Mild nonobstructive plaque in cath 2013   Chronic constipation    COPD with emphysema [facility])    pulmonology--- dr b. icard/ tammy parrett NP;   no oxygen, no daily inhaler   Diverticulosis of colon    w/ hx diverticulitis   Dyslipidemia    GAD (generalized anxiety disorder)    GERD (gastroesophageal reflux disease)    Hemorrhoids    internal and external   History of adenomatous polyp of colon    followed by dr avram   History of antineoplastic chemotherapy    History of basal cell carcinoma (BCC) excision    2019   s/p MOH's nose   History of GI bleed 05/2021   in setting post op cardiac cath PCI / stenting on blood thinnner/ 05-27-2021 Hg 7.9,  EGD done 05-30-2023  showed erosive gastrophy no active bleeding,  received x3 PRBC and IV iron  infusions   History of gout    History of kidney stones    History of palpitations    event monitor--- 11-21-2021  showed infrequent NSVT, ST   History of radiation therapy    pallitive radition to right lung and left axilla 04-30-2018  to 05-21-2018;   enlarging early stage LUL mass SBRT  01-09-2022  to 01-19-2022   Hypertension    Hypothyroidism (acquired)    followed by pcp   IDA (iron  deficiency anemia)    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   urologist--- dr gay/  radiation onologist--- dr patrcia;  dx 05/ 2024, gleason 4+3   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) [facility]) dx'd 04/17/18   to LN, infrahilar mass, lung nodule and lt axilla   Nephrolithiasis    per CT 02-21-2023 in epic bilateral renal calucli nonobstructive   OSA (obstructive sleep apnea)    sleep study 03-22-2021 in epic , mild osa AHI 8.2/hr  cpap intolerant   Port-A-Cath in place    Small bowel obstruction (HCC)  Squamous cell carcinoma of lung, stage IV, 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   oncologist--- dr ozzie  radiation onologist-- dr dewey;  dx by needle bx;  Non-small cell lung cancer SCC w/ large right infrahilar mass in addition left upper lobe nodule & left axilla mass w/ left axilla lymph node;  treated w/ pallitive radiation , systemic chemo, maintenance immunotherapy discontinued due to intolerence 03/ 2022    Current Outpatient Medications:    acetaminophen  (TYLENOL ) 500 MG tablet, Take 1,000 mg by mouth every 6 (six) hours as needed for moderate pain., Disp: , Rfl:    albuterol  (PROVENTIL ) (2.5 MG/3ML) 0.083% nebulizer solution, Take 3 mLs (2.5 mg total) by nebulization every 6 (six) hours as needed for wheezing or shortness of breath., Disp: 75 mL, Rfl: 5    albuterol  (VENTOLIN  HFA) 108 (90 Base) MCG/ACT inhaler, INHALE 2 PUFFS INTO THE LUNGS EVERY 6 HOURS AS NEEDED., Disp: 6.7 each, Rfl: 2   Alirocumab  (PRALUENT ) 150 MG/ML SOAJ, INJECT 150 MG INTO THE SKIN EVERY 14 (FOURTEEN) DAYS., Disp: 6 mL, Rfl: 3   Artificial Tear Solution (SOOTHE XP OP), Place 1 drop into both eyes 2 (two) times daily., Disp: , Rfl:    b complex vitamins capsule, Take 1 capsule by mouth daily., Disp: , Rfl:    carvedilol  (COREG ) 25 MG tablet, Take 1 tablet (25 mg total) by mouth 2 (two) times daily with a meal., Disp: 180 tablet, Rfl: 0   clopidogrel  (PLAVIX ) 75 MG tablet, TAKE 1 TABLET BY MOUTH EVERY DAY, Disp: 90 tablet, Rfl: 2   [employer] IRON  325 (65 Fe) MG tablet, TAKE 1 TABLET BY MOUTH DAILY, Disp: 90 tablet, Rfl: 1   levothyroxine  (SYNTHROID ) 137 MCG tablet, TAKE 1 TABLET BY MOUTH DAILY. *DOSE CHANGE. REPEAT LABS IN MAY, Disp: 90 tablet, Rfl: 1   methocarbamol (ROBAXIN) 500 MG tablet, Take 500 mg by mouth every 6 (six) hours as needed for muscle spasms., Disp: , Rfl:    Multiple Vitamin (MULTIVITAMIN) capsule, Take 1 capsule by mouth daily., Disp: , Rfl:    MYRBETRIQ 50 MG TB24 tablet, Take 50 mg by mouth daily., Disp: , Rfl:    nitroGLYCERIN  (NITROSTAT ) 0.4 MG SL tablet, Place 1 tablet (0.4 mg total) under the tongue every 5 (five) minutes as needed for chest pain., Disp: 25 tablet, Rfl: 2   ondansetron  (ZOFRAN -ODT) 4 MG disintegrating tablet, Take 1 tablet (4 mg total) by mouth every 8 (eight) hours as needed for nausea or vomiting. (Patient not taking: Reported on 06/12/2024), Disp: 20 tablet, Rfl: 0   oxyCODONE  (ROXICODONE ) 5 MG immediate release tablet, Take 0.5 tablets (2.5 mg total) by mouth every 6 (six) hours as needed for severe pain (pain score 7-10)., Disp: 4 tablet, Rfl: 0   pantoprazole  (PROTONIX ) 40 MG tablet, TAKE 1 TABLET BY MOUTH EVERY DAY BEFORE BREAKFAST, Disp: 90 tablet, Rfl: 3   Polyethylene Glycol 3350  (MIRALAX PO), Take by mouth daily., Disp: , Rfl:     predniSONE  (DELTASONE ) 20 MG tablet, 2 po at same time daily for 5 days SECOND RX, Disp: 10 tablet, Rfl: 0   Probiotic Product (ALIGN PO), Take 1 tablet by mouth 2 (two) times daily. gummy, Disp: , Rfl:    tamsulosin  (FLOMAX ) 0.4 MG CAPS capsule, Take 0.4 mg by mouth at bedtime., Disp: , Rfl:    TRELEGY ELLIPTA  200-62.5-25 MCG/ACT AEPB, INHALE 1 PUFF INTO THE LUNGS EVERY DAY, Disp: 60 each, Rfl: 3   umeclidinium bromide  (INCRUSE ELLIPTA ) 62.5 MCG/ACT AEPB, Inhale 1  puff into the lungs daily. (Patient not taking: Reported on 06/12/2024), Disp: 30 each, Rfl: 5 Social History   Socioeconomic History   Marital status: Widowed    Spouse name: Not on file   Number of children: 1   Years of education: Not on file   Highest education level: Not on file  Occupational History   Occupation: emergency planning/management officer  Tobacco Use   Smoking status: Former    Current packs/day: 0.00    Average packs/day: 0.2 packs/day for 2.0 years (0.4 ttl pk-yrs)    Types: Cigarettes    Start date: 10/01/1968    Quit date: 3    Years since quitting: 53.8   Smokeless tobacco: Never  Vaping Use   Vaping status: Never Used  Substance and Sexual Activity   Alcohol use: Not Currently    Comment: occasional   Drug use: Never   Sexual activity: Not on file  Other Topics Concern   Not on file  Social History Narrative   Unable to ask intimate partner violence questions, wife present.    Pt's wife died in 08-24-2021.   Social Drivers of Corporate Investment Banker Strain: Low Risk  (11/25/2023)   Overall Financial Resource Strain (CARDIA)    Difficulty of Paying Living Expenses: Not hard at all  Food Insecurity: No Food Insecurity (05/26/2024)   Hunger Vital Sign    Worried About Running Out of Food in the Last Year: Never true    Ran Out of Food in the Last Year: Never true  Transportation Needs: No Transportation Needs (05/26/2024)   PRAPARE - Administrator, Civil Service (Medical): No    Lack of  Transportation (Non-Medical): No  Physical Activity: Sufficiently Active (11/25/2023)   Exercise Vital Sign    Days of Exercise per Week: 5 days    Minutes of Exercise per Session: 30 min  Stress: No Stress Concern Present (11/25/2023)   [employer] of Occupational Health - Occupational Stress Questionnaire    Feeling of Stress : Not at all  Social Connections: Moderately Isolated (05/15/2024)   Social Connection and Isolation Panel    Frequency of Communication with Friends and Family: Three times a week    Frequency of Social Gatherings with Friends and Family: More than three times a week    Attends Religious Services: Never    Database Administrator or Organizations: Yes    Attends Engineer, Structural: More than 4 times per year    Marital Status: Widowed  Intimate Partner Violence: Not At Risk (05/26/2024)   Humiliation, Afraid, Rape, and Kick questionnaire    Fear of Current or Ex-Partner: No    Emotionally Abused: No    Physically Abused: No    Sexually Abused: No   Family History  Problem Relation Age of Onset   Heart failure Mother    Parkinson's disease Mother    Heart attack Father    Healthy Sister    Skin cancer Sister    Healthy Sister    Healthy Sister    Neuropathy Brother    COPD Brother    Epilepsy Brother    Colon cancer Neg Hx    Pancreatic cancer Neg Hx    Prostate cancer Neg Hx    Rectal cancer Neg Hx    Stomach cancer Neg Hx     Objective: Office vital signs reviewed. BP 122/81   Pulse 88   Temp (!) 97.3 F (36.3 C)  Ht 6' 2 (1.88 m)   Wt 248 lb 2 oz (112.5 kg)   SpO2 98%   BMI 31.86 kg/m   Physical Examination:  General: Awake, alert, nontoxic male, No acute distress HEENT: sclera white, MMM; clear rhinorrhea. Cardio: regular rate and rhythm, S1S2 heard, no murmurs appreciated Pulm: clear to auscultation bilaterally, no wheezes, rhonchi or rales; normal work of breathing on room air  Assessment/ Plan: 78 y.o. male    Acute rhinitis - Plan: desloratadine (CLARINEX) 5 MG tablet, azelastine  (ASTELIN ) 0.1 % nasal spray  Persistent cough - Plan: benzonatate  (TESSALON ) 200 MG capsule  Encounter for immunization - Plan: Flu vaccine HIGH DOSE PF(Fluzone Trivalent)   Clarinex added for rhinorrhea as well as Astelin  nasal spray.  May continue Flonase  if needed for nasal congestion.  Tessalon  Perles given for as needed cough.  I suspect that this persistent cough is due to underlying COPD and pulmonary neoplasm.  Advised to follow-up with pulmonology and I will reach out to them to see if there are any interventions they would like prior to their next visit.   Norene CHRISTELLA Fielding, DO [facility] [phone]

## 2024-07-14 DIAGNOSIS — M6281 Muscle weakness (generalized): Secondary | ICD-10-CM | POA: Diagnosis not present

## 2024-07-14 DIAGNOSIS — M545 Low back pain, unspecified: Secondary | ICD-10-CM | POA: Diagnosis not present

## 2024-07-30 ENCOUNTER — Ambulatory Visit: Admitting: Adult Health

## 2024-07-30 ENCOUNTER — Encounter: Payer: Self-pay | Admitting: Adult Health

## 2024-07-30 VITALS — BP 125/81 | HR 88 | Temp 97.9°F | Ht 74.0 in | Wt 249.6 lb

## 2024-07-30 DIAGNOSIS — R5381 Other malaise: Secondary | ICD-10-CM

## 2024-07-30 DIAGNOSIS — C3491 Malignant neoplasm of unspecified part of right bronchus or lung: Secondary | ICD-10-CM

## 2024-07-30 DIAGNOSIS — Z6832 Body mass index (BMI) 32.0-32.9, adult: Secondary | ICD-10-CM | POA: Diagnosis not present

## 2024-07-30 DIAGNOSIS — J449 Chronic obstructive pulmonary disease, unspecified: Secondary | ICD-10-CM | POA: Diagnosis not present

## 2024-07-30 DIAGNOSIS — R0609 Other forms of dyspnea: Secondary | ICD-10-CM

## 2024-07-30 NOTE — Patient Instructions (Addendum)
 Continue on Trelegy 1 puff daily, rinse after use.  Albuterol  inhaler or neb As needed   Flutter valve As needed   Mucinex  Twice daily  As needed  cough/congestion.  Albuterol  inhaler As needed   Activity as tolerated.  Astelin  nasal As needed   Clarinex  daily As needed   Saline nasal gel At bedtime  As needed  nasal dryness.  CT chest as planned with Oncology   Follow up with Cardiology as discussed.  Follow up in 3-4  months with Dr. Shelah or Rydan Gulyas NP and As needed

## 2024-07-30 NOTE — Progress Notes (Signed)
 @Patient  ID: Travis Padilla, male    DOB: January 04, 1946, 78 y.o.   MRN: [id number]  Chief Complaint  Patient presents with   COPD    F/U     Referring provider: Jolinda Norene CHRISTELLA, DO  HPI: 78 year old male former smoker followed for known history of metastatic lung cancer, stage IV squamous cell carcinoma diagnosed 2019 (underwent 4 cycles of chemo followed by Keytruda  and radiation) history of COPD. 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  Stable radiation changes in the paramediastinal right lung, stable left axillary soft tissue nodule   Spirometry test at the [facility] clinic in the spring 2019 showed moderate airflow obstruction with a ratio of 68% and FEV1 of 74% predicted  Methacholine challenge test in the spring 2019 at [facility] clinic was negative Exhaled nitric oxide  testing at [facility] clinic in the spring 2019 was 32 ppm   PFT 05/2018 PFT normal , DLCO 73%    10/2021  PFTs that showed was normal lung function with no significant airflow obstruction or restriction.  Normal diffusing capacity with an FEV1 at 103%, ratio 77, FVC 98%, no significant bronchodilator response, DLCO 83%. Stable lung function with improved diffusing capacity.   Lung Cancer timeline:  Palliative radiotherapy to the right infrahilar mass as well as the axillary mass under the care of Dr. Dewey. 2) Systemic chemotherapy with carboplatin  for AUC of 5, paclitaxel  175 mg/M2 and Keytruda  200 mg IV every 3 weeks status post 4 cycles. 3) Maintenance  immunotherapy with single agent Keytruda  200 mg IV every 3 weeks status post 41 cycles.  His treatment is currently on hold secondary to intolerance.   Discussed the use of AI scribe software for clinical note transcription with the patient, who gave verbal consent to proceed.  History of Present Illness Travis Padilla is a 78 year old male with stage IV lung cancer and COPD who presents with concerns about his breathing and medication regimen. Here for a 4 month follow up.   Since last visit was admitted to hospital for  small bowel obstruction and dehydration episode after using Wegovy  for weight loss. He was hospitalized for five days. No longer taking wegovy .   He uses Trelegy for COPD, which he finds more effective than previous inhalers, but still requires  use of albuterol , especially at night. He also uses Mucinex , fluticasone , for allergy relief. Has ongoing dyspnea with heavy activities. Daily cough that waxes and wanes.   He is actively trying to improve his lung capacity through exercise at the gym, which he finds beneficial. He discovered mold in his home, which he suspects may be contributing to some respiratory symptoms, and has scheduled remediation for December.  He has a history of heart disease with previous stents and is experiencing increased shortness of breath with activity, which he is concerned may be related to his cardiac condition. He has an upcoming cardiology appointment.  He uses Claritin and a nasal spray for allergies, which have helped with head congestion and opened up his chest. He experiences epistaxis, which he attributes to irritation from nasal  sprays.  No hemoptysis, but he notes epistaxis   He has been tested for sleep apnea but did not tolerate the CPAP machine well.  History of stage IV lung cancer previous treated with Chemo/Radiation/Immunotherapy. Followed by oncology. Surveillance CT 05/25/24 showed stable changes without evidence of recurrent  disease.    Allergies  Allergen Reactions   Iodinated Contrast Media Hives and Rash    Pt presented to CT after completed 13 hr premedication regimen and still had an minor rash/hives.    Iohexol  Rash    CT scan contrast caused rash and itching    does fine with premeds.   Pravastatin      Numbness in feet     Immunization History  Administered Date(s) Administered   Fluad Trivalent(High Dose 65+) 06/19/2023   INFLUENZA, HIGH DOSE SEASONAL PF 08/03/2014, 05/29/2015, 07/22/2017, 06/11/2018, 05/05/2019, 06/28/2020, 05/16/2021, 04/24/2022, 07/13/2024   Influenza Nasal 06/29/2016   Influenza Split 05/14/2012   Influenza-Unspecified 06/29/2016   PFIZER Comirnaty(Gray Top)Covid-19 Tri-Sucrose Vaccine 02/14/2021   PFIZER(Purple Top)SARS-COV-2 Vaccination 10/15/2019, 06/13/2020, 06/19/2023   PNEUMOCOCCAL CONJUGATE-20 04/18/2022   Pfizer Covid-19 Vaccine Bivalent Booster 67yrs & up 06/19/2023   Pneumococcal Conjugate-13 03/30/2015   Pneumococcal Polysaccharide-23 07/21/2012   Td 02/23/2015   Zoster Recombinant(Shingrix ) 11/01/2021, 04/18/2022   Zoster, Live 07/21/2012    Past Medical History:  Diagnosis Date   Arthritis    Benign localized prostatic hyperplasia with lower urinary tract symptoms (LUTS)    CAD (coronary artery disease) 2013   cardiologist--- dr lavona;   08-26-2012 cath for poss ischemia on NUC-- moderate nonob CAD, aggressive medical manage;  staged cardiac cath 09/ 2022 PCI, DES x1 to mLAD (80%) ;   arthrectomy and DES prox-mid RCA Mild nonobstructive plaque in cath 2013   Chronic constipation    COPD with emphysema [facility])    pulmonology--- dr b. icard/ Maurisha Mongeau NP;   no oxygen, no daily inhaler   Diverticulosis of colon    w/ hx diverticulitis   Dyslipidemia    GAD (generalized anxiety disorder)    GERD (gastroesophageal reflux disease)    Hemorrhoids    internal and external   History of adenomatous polyp of colon    followed by dr avram   History of  antineoplastic chemotherapy    History of basal cell carcinoma (BCC) excision    2019  s/p MOH's nose   History of GI bleed 05/2021   in setting post op cardiac cath PCI / stenting on blood thinnner/ 05-27-2021 Hg 7.9,  EGD done 05-30-2023  showed erosive gastrophy no active bleeding,  received x3 PRBC and IV iron  infusions   History of gout    History of kidney stones    History of palpitations    event monitor--- 11-21-2021  showed infrequent NSVT, ST   History of radiation therapy    pallitive radition to right lung and left axilla 04-30-2018  to 05-21-2018;   enlarging early stage LUL mass SBRT  01-09-2022  to 01-19-2022   Hypertension    Hypothyroidism (acquired)    followed by pcp   IDA (iron  deficiency anemia)    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   urologist--- dr gay/  radiation onologist--- dr patrcia;  dx 05/ 2024, gleason 4+3   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) [facility]) dx'd 04/17/18   to LN, infrahilar mass, lung nodule and lt axilla   Nephrolithiasis    per CT 02-21-2023 in epic bilateral renal calucli nonobstructive   OSA (obstructive  sleep apnea)    sleep study 03-22-2021 in epic , mild osa AHI 8.2/hr  cpap intolerant   Port-A-Cath in place    Small bowel obstruction (HCC)    Squamous cell carcinoma of lung, stage IV, 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   oncologist--- dr ozzie  radiation onologist-- dr dewey;  dx by needle bx;  Non-small cell lung cancer SCC w/ large right infrahilar mass in addition left upper lobe nodule & left axilla mass w/ left axilla lymph node;  treated w/ pallitive radiation , systemic chemo, maintenance immunotherapy discontinued due to intolerence 03/ 2022    Tobacco History: Social History   Tobacco Use  Smoking Status Former   Current packs/day: 0.00   Average packs/day: 0.2 packs/day for 2.0 years (0.4 ttl pk-yrs)   Types: Cigarettes   Start date: 10/01/1968   Quit date: 1972   Years since quitting: 53.9  Smokeless  Tobacco Never   Counseling given: Not Answered   Outpatient Medications Prior to Visit  Medication Sig Dispense Refill   acetaminophen  (TYLENOL ) 500 MG tablet Take 1,000 mg by mouth every 6 (six) hours as needed for moderate pain.     albuterol  (PROVENTIL ) (2.5 MG/3ML) 0.083% nebulizer solution Take 3 mLs (2.5 mg total) by nebulization every 6 (six) hours as needed for wheezing or shortness of breath. 75 mL 5   albuterol  (VENTOLIN  HFA) 108 (90 Base) MCG/ACT inhaler INHALE 2 PUFFS INTO THE LUNGS EVERY 6 HOURS AS NEEDED. 6.7 each 2   Alirocumab  (PRALUENT ) 150 MG/ML SOAJ INJECT 150 MG INTO THE SKIN EVERY 14 (FOURTEEN) DAYS. 6 mL 3   Artificial Tear Solution (SOOTHE XP OP) Place 1 drop into both eyes 2 (two) times daily.     azelastine  (ASTELIN ) 0.1 % nasal spray Place 1 spray into both nostrils 2 (two) times daily as needed for rhinitis (drainage). 30 mL 12   b complex vitamins capsule Take 1 capsule by mouth daily.     benzonatate  (TESSALON ) 200 MG capsule Take 1 capsule (200 mg total) by mouth 2 (two) times daily as needed for cough. 20 capsule 0   carvedilol  (COREG ) 25 MG tablet Take 1 tablet (25 mg total) by mouth 2 (two) times daily with a meal. 180 tablet 0   clopidogrel  (PLAVIX ) 75 MG tablet TAKE 1 TABLET BY MOUTH EVERY DAY 90 tablet 2   [employer] IRON  325 (65 Fe) MG tablet TAKE 1 TABLET BY MOUTH DAILY 90 tablet 1   desloratadine  (CLARINEX ) 5 MG tablet Take 1 tablet (5 mg total) by mouth daily as needed (drainage/ allergies). 90 tablet 3   levothyroxine  (SYNTHROID ) 137 MCG tablet TAKE 1 TABLET BY MOUTH DAILY. *DOSE CHANGE. REPEAT LABS IN MAY 90 tablet 1   methocarbamol (ROBAXIN) 500 MG tablet Take 500 mg by mouth every 6 (six) hours as needed for muscle spasms.     Multiple Vitamin (MULTIVITAMIN) capsule Take 1 capsule by mouth daily.     MYRBETRIQ 50 MG TB24 tablet Take 50 mg by mouth daily.     nitroGLYCERIN  (NITROSTAT ) 0.4 MG SL tablet Place 1 tablet (0.4 mg total) under the tongue every 5  (five) minutes as needed for chest pain. 25 tablet 2   ondansetron  (ZOFRAN -ODT) 4 MG disintegrating tablet Take 1 tablet (4 mg total) by mouth every 8 (eight) hours as needed for nausea or vomiting. 20 tablet 0   oxyCODONE  (ROXICODONE ) 5 MG immediate release tablet Take 0.5 tablets (2.5 mg total) by mouth every 6 (six) hours as needed for  severe pain (pain score 7-10). 4 tablet 0   pantoprazole  (PROTONIX ) 40 MG tablet TAKE 1 TABLET BY MOUTH EVERY DAY BEFORE BREAKFAST 90 tablet 3   Polyethylene Glycol 3350  (MIRALAX PO) Take by mouth daily.     Probiotic Product (ALIGN PO) Take 1 tablet by mouth 2 (two) times daily. gummy     tamsulosin  (FLOMAX ) 0.4 MG CAPS capsule Take 0.4 mg by mouth at bedtime.     TRELEGY ELLIPTA  200-62.5-25 MCG/ACT AEPB INHALE 1 PUFF INTO THE LUNGS EVERY DAY 60 each 3   predniSONE  (DELTASONE ) 20 MG tablet 2 po at same time daily for 5 days SECOND RX (Patient not taking: Reported on 07/13/2024) 10 tablet 0   No facility-administered medications prior to visit.     Review of Systems:   Constitutional:   No  weight loss, night sweats,  Fevers, chills,+ fatigue, or  lassitude.  HEENT:   No headaches,  Difficulty swallowing,  Tooth/dental problems, or  Sore throat,                No sneezing, itching, ear ache, +nasal congestion, post nasal drip,   CV:  No chest pain,  Orthopnea, PND, swelling in lower extremities, anasarca, dizziness, palpitations, syncope.   GI  No heartburn, indigestion, abdominal pain, nausea, vomiting, diarrhea, change in bowel habits, loss of appetite, bloody stools.   Resp:   No chest wall deformity  Skin: no rash or lesions.  GU: no dysuria, change in color of urine, no urgency or frequency.  No flank pain, no hematuria   MS:  No joint pain or swelling.  No decreased range of motion.  No back pain.    Physical Exam  BP 125/81   Pulse 88   Temp 97.9 F (36.6 C)   Ht 6' 2 (1.88 m) Comment: Per pt  Wt 249 lb 9.6 oz (113.2 kg)   SpO2 98%  Comment: RA  BMI 32.05 kg/m   GEN: A/Ox3; pleasant , NAD, well nourished    HEENT:  /AT,  NOSE-clear, THROAT-clear, no lesions, no postnasal drip or exudate noted.   NECK:  Supple w/ fair ROM; no JVD; normal carotid impulses w/o bruits; no thyromegaly or nodules palpated; no lymphadenopathy.    RESP  Clear  P & A; w/o, wheezes/ rales/ or rhonchi. no accessory muscle use, no dullness to percussion  CARD:  RRR, no m/r/g, no peripheral edema, pulses intact, no cyanosis or clubbing.  GI:   Soft & nt; nml bowel sounds; no organomegaly or masses detected.   Musco: Warm bil, no deformities or joint swelling noted.   Neuro: alert, no focal deficits noted.    Skin: Warm, no lesions or rashes    Lab Results:Reviewed 07/30/2024   CBC    Component Value Date/Time   WBC 6.8 05/25/2024 1208   WBC 5.2 05/20/2024 0444   RBC 3.50 (L) 05/25/2024 1208   HGB 11.8 (L) 05/25/2024 1208   HGB 12.3 (L) 08/27/2023 1028   HCT 33.9 (L) 05/25/2024 1208   HCT 36.7 (L) 08/27/2023 1028   PLT 205 05/25/2024 1208   PLT 173 08/27/2023 1028   MCV 96.9 05/25/2024 1208   MCV 103 (H) 08/27/2023 1028   MCH 33.7 05/25/2024 1208   MCHC 34.8 05/25/2024 1208   RDW 11.9 05/25/2024 1208   RDW 12.1 08/27/2023 1028   LYMPHSABS 0.9 05/25/2024 1208   LYMPHSABS 0.8 08/27/2023 1028   MONOABS 0.7 05/25/2024 1208   EOSABS 0.1 05/25/2024 1208  EOSABS 0.2 08/27/2023 1028   BASOSABS 0.0 05/25/2024 1208   BASOSABS 0.0 08/27/2023 1028    BMET    Component Value Date/Time   NA 141 05/25/2024 1208   NA 142 05/14/2024 1522   K 3.8 05/25/2024 1208   CL 109 05/25/2024 1208   CO2 26 05/25/2024 1208   GLUCOSE 111 (H) 05/25/2024 1208   BUN 10 05/25/2024 1208   BUN 33 (H) 05/14/2024 1522   CREATININE 1.16 05/25/2024 1208   CALCIUM 8.3 (L) 05/25/2024 1208   GFRNONAA >60 05/25/2024 1208   GFRAA >60 06/13/2020 0826    BNP    Component Value Date/Time   BNP 479.9 (H) 11/28/2020 0454    ProBNP No results  found for: PROBNP  Imaging: No results found.  Administration History     None          Latest Ref Rng & Units 01/10/2024    8:34 AM 11/02/2021    8:55 AM 05/30/2018   10:43 AM  PFT Results  FVC-Pre L 3.74  4.56  4.40  C  FVC-Predicted Pre % 82  98  93  C  FVC-Post L  4.53  4.26  C  FVC-Predicted Post %  98  90  C  Pre FEV1/FVC % % 71  76  80  C  Post FEV1/FCV % %  77  80  C  FEV1-Pre L 2.65  3.45  3.52  C  FEV1-Predicted Pre % 80  102  101  C  FEV1-Post L  3.47  3.40  C  DLCO uncorrected ml/min/mmHg 18.61  22.37  25.71  C  DLCO UNC% % 70  83  73  C  DLCO corrected ml/min/mmHg  22.37  27.59  C  DLCO COR %Predicted %  83  78  C  DLVA Predicted % 85  91  86  C  TLC L 5.62  6.87    TLC % Predicted % 75  92    RV % Predicted % 62  90      C Corrected result    No results found for: NITRICOXIDE      No data to display              Assessment & Plan:   Assessment and Plan Assessment & Plan Stage IV lung cancer s/p Chemo/Immunotherapy /Radiation - appears stable on recent CT imaging without evidence of return. Stable  Keep follow up with Oncology and ongoing surveillance CT imaging.   COPD -stable on Trelegy with daily symptoms burden of cough and dyspnea. Continue Trelegy inhaler and use albuterol  as needed. Address mold exposure at home and maintain the exercise regimen.   Coronary artery disease, status post stents  - Coronary artery disease with stents and no recent cardiology follow-up.   Schedule an appointment with cardiology for a routine checkup.   Allergic rhinitis and chronic cough   Symptoms are managed with Claritin, Astelin  nasal spray, and Mucinex , showing improvement. Mold exposure may exacerbate symptoms. Continue Claritin, Astelin  nasal spray, and Mucinex . Address mold exposure at home.  Obesity  BMI 32.  Weight loss attempts with Wegovy  have been unsuccessful. Healthy weight loss  encouraged for overall health benefits. SBO while taking  Wegovy , would avoid GLP-1 for weight loss.   Dyspnea-suspect is multifactoral -underlying COPD, radiation induced fibrosis, obesity, deconditioning. Has know CAD . COPD on maximum therapy. PFT stable 01/2024, CT chest stable changes 05/2024. Anemia stable on labs 05/2024.  Follow up with cardiology  for evaluation   Plan  Patient Instructions  Continue on Trelegy 1 puff daily, rinse after use.  Albuterol  inhaler or neb As needed   Flutter valve As needed   Mucinex  Twice daily  As needed  cough/congestion.  Albuterol  inhaler As needed   Activity as tolerated.  Astelin  nasal As needed   Clarinex daily As needed   Saline nasal gel At bedtime  As needed  nasal dryness.  CT chest as planned with Oncology   Follow up with Cardiology as discussed.  Follow up in 3-4  months with Dr. Shelah or Rudie Rikard NP and As needed           Madelin Stank, NP 07/30/2024  .

## 2024-08-03 DIAGNOSIS — C61 Malignant neoplasm of prostate: Secondary | ICD-10-CM | POA: Diagnosis not present

## 2024-08-13 DIAGNOSIS — N401 Enlarged prostate with lower urinary tract symptoms: Secondary | ICD-10-CM | POA: Diagnosis not present

## 2024-08-13 DIAGNOSIS — C61 Malignant neoplasm of prostate: Secondary | ICD-10-CM | POA: Diagnosis not present

## 2024-08-30 ENCOUNTER — Other Ambulatory Visit: Payer: Self-pay | Admitting: Family Medicine

## 2024-09-04 ENCOUNTER — Other Ambulatory Visit: Payer: Self-pay | Admitting: Cardiology
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
## 2024-09-18 ENCOUNTER — Encounter: Payer: Self-pay | Admitting: Cardiology

## 2024-09-18 ENCOUNTER — Ambulatory Visit: Attending: Cardiology | Admitting: Cardiology

## 2024-09-18 VITALS — BP 110/60 | HR 78 | Ht 74.0 in | Wt 258.0 lb

## 2024-09-18 DIAGNOSIS — E785 Hyperlipidemia, unspecified: Secondary | ICD-10-CM | POA: Insufficient documentation

## 2024-09-18 DIAGNOSIS — R079 Chest pain, unspecified: Secondary | ICD-10-CM | POA: Diagnosis present

## 2024-09-18 DIAGNOSIS — I10 Essential (primary) hypertension: Secondary | ICD-10-CM | POA: Diagnosis not present

## 2024-09-18 DIAGNOSIS — R002 Palpitations: Secondary | ICD-10-CM | POA: Insufficient documentation

## 2024-09-18 DIAGNOSIS — I2510 Atherosclerotic heart disease of native coronary artery without angina pectoris: Secondary | ICD-10-CM | POA: Insufficient documentation

## 2024-09-18 NOTE — Patient Instructions (Signed)
 Medication Instructions:  Your physician recommends that you continue on your current medications as directed. Please refer to the Current Medication list given to you today.  *If you need a refill on your cardiac medications before your next appointment, please call your pharmacy*  Lab Work: NONE If you have labs (blood work) drawn today and your tests are completely normal, you will receive your results only by: MyChart Message (if you have MyChart) OR A paper copy in the mail If you have any lab test that is abnormal or we need to change your treatment, we will call you to review the results.  Testing/Procedures: PET CT Stress Test  Follow-Up: At [facility], you and your health needs are our priority.  As part of our continuing mission to provide you with exceptional heart care, our providers are all part of one team.  This team includes your primary Cardiologist (physician) and Advanced Practice Providers or APPs (Physician Assistants and Nurse Practitioners) who all work together to provide you with the care you need, when you need it.  Your next appointment:   2 month(s) in [place]  Provider:   Lavona, MD   We recommend signing up for the patient portal called MyChart.  Sign up information is provided on this After Visit Summary.  MyChart is used to connect with patients for Virtual Visits (Telemedicine).  Patients are able to view lab/test results, encounter notes, upcoming appointments, etc.  Non-urgent messages can be sent to your provider as well.   To learn more about what you can do with MyChart, go to [email or web address].   Other Instructions    Please report to Radiology at the [facility] Main Entrance 30 minutes early for your test.  [address] [place], [place] [address]               How to Prepare for Your Cardiac PET/CT Stress Test:  Nothing to eat or drink, except water , 3 hours prior to arrival time.  NO  caffeine/decaffeinated products, or chocolate 12 hours prior to arrival. (Please note decaffeinated beverages (teas/coffees) still contain caffeine).  If you have caffeine within 12 hours prior, the test will need to be rescheduled.  Medication instructions: Do not take erectile dysfunction medications for 72 hours prior to test (sildenafil, tadalafil) Do not take nitrates (isosorbide mononitrate, Ranexa) the day before or day of test Do not take tamsulosin  the day before or morning of test Hold theophylline containing medications for 12 hours. Hold Dipyridamole 48 hours prior to the test.  Diabetic Preparation: If able to eat breakfast prior to 3 hour fasting, you may take all medications, including your insulin. Do not worry if you miss your breakfast dose of insulin - start at your next meal. If you do not eat prior to 3 hour fast-Hold all diabetes (oral and insulin) medications. Patients who wear a continuous glucose monitor MUST remove the device prior to scanning.  You may take your remaining medications with water .  NO perfume, cologne or lotion on chest or abdomen area. FEMALES - Please avoid wearing dresses to this appointment.  Total time is 1 to 2 hours; you may want to bring reading material for the waiting time.  IF YOU THINK YOU MAY BE PREGNANT, OR ARE NURSING PLEASE INFORM THE TECHNOLOGIST.  In preparation for your appointment, medication and supplies will be purchased.  Appointment availability is limited, so if you need to cancel or reschedule, please call the Radiology Department Scheduler at [phone]  24 hours in advance to avoid a cancellation fee of $100.00  What to Expect When you Arrive:  Once you arrive and check in for your appointment, you will be taken to a preparation room within the Radiology Department.  A technologist or Nurse will obtain your medical history, verify that you are correctly prepped for the exam, and explain the procedure.  Afterwards, an  IV will be started in your arm and electrodes will be placed on your skin for EKG monitoring during the stress portion of the exam. Then you will be escorted to the PET/CT scanner.  There, staff will get you positioned on the scanner and obtain a blood pressure and EKG.  During the exam, you will continue to be connected to the EKG and blood pressure machines.  A small, safe amount of a radioactive tracer will be injected in your IV to obtain a series of pictures of your heart along with an injection of a stress agent.    After your Exam:  It is recommended that you eat a meal and drink a caffeinated beverage to counter act any effects of the stress agent.  Drink plenty of fluids for the remainder of the day and urinate frequently for the first couple of hours after the exam.  Your doctor will inform you of your test results within 7-10 business days.  For more information and frequently asked questions, please visit our website: [email or web address]  For questions about your test or how to prepare for your test, please call: Cardiac Imaging Nurse Navigators Office: [phone]

## 2024-09-21 ENCOUNTER — Encounter: Attending: Family Medicine | Admitting: Nutrition

## 2024-09-21 ENCOUNTER — Encounter: Payer: Self-pay | Admitting: Nutrition

## 2024-09-21 VITALS — Ht 74.0 in | Wt 256.0 lb

## 2024-09-21 DIAGNOSIS — E66811 Obesity, class 1: Secondary | ICD-10-CM | POA: Insufficient documentation

## 2024-09-21 DIAGNOSIS — I2510 Atherosclerotic heart disease of native coronary artery without angina pectoris: Secondary | ICD-10-CM | POA: Insufficient documentation

## 2024-09-21 NOTE — Progress Notes (Signed)
 Medical Nutrition Therapy  Appointment Start time:  [phone]  Appointment End time:  1030  Primary concerns today: Obesity, CAD Referral diagnosis: I25.10, E66.9 Preferred learning style: No Preference  Learning readiness: Ready    NUTRITION ASSESSMENT  79 yr old wmale referred for CAD and Obesity. PMH complicated with history of Lung Cancer Stg 4 in remission,, Prostate cancer, COPD, chronic constipation. See medical history. Referred by PCP Dr. Jolinda He notes he has tried numerous things to lose weight and can't seem to lose any weight. He has been tracking what he is eating and the calories. He has been trakcing calories more than he has been tracking proper nutrient dense foods and his Macros. He is working out a few times per week. Is trying to eat healthier foods. Lost his wife a few years ago and cooks for himself now. Eats out a few times per week. 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 Had been on a GLP and ended up having to stop it due to chronic constipation. Gained 6+ lbs back after stopping GLP 1. Current diet recall reveals a diet with excess calories from some processed foods at times and not whole plant based foods. Diet  needs more fiber and cut out refined carbohydrates. He notes he had been drinking some Boost in the past. He would benefit from a whole plant based diet/lifestyle, especially with all his chronic medical problems. He is willing to work with Lifestyle Medicine and focus more on a whole plant based lifestyle.  Clinical  Wt Readings from Last 3 Encounters:  09/21/24 256 lb (116.1 kg)  09/18/24 258 lb (117 kg)  07/30/24 249 lb 9.6 oz (113.2 kg)   Ht Readings from Last 3 Encounters:  09/21/24 6' 2 (1.88 m)  09/18/24 6' 2 (1.88 m)  07/30/24 6' 2 (1.88 m)   Body mass index is 32.87 kg/m. @BMIFA @ Facility age limit for growth %iles is 20 years. Facility age limit for growth %iles is 20 years.  Medical Hx:  Past Medical History:   Diagnosis Date   Arthritis    Benign localized prostatic hyperplasia with lower urinary tract symptoms (LUTS)    CAD (coronary artery disease) 2013   cardiologist--- dr lavona;   08-26-2012 cath for poss ischemia on NUC-- moderate nonob CAD, aggressive medical manage;  staged cardiac cath 09/ 2022 PCI, DES x1 to mLAD (80%) ;   arthrectomy and DES prox-mid RCA Mild nonobstructive plaque in cath 2013   Chronic constipation    COPD with emphysema [facility])    pulmonology--- dr b. icard/ tammy parrett NP;   no oxygen, no daily inhaler   Diverticulosis of colon    w/ hx diverticulitis   Dyslipidemia    GAD (generalized anxiety disorder)    GERD (gastroesophageal reflux disease)    Hemorrhoids    internal and external   History of adenomatous polyp of colon    followed by dr avram   History of antineoplastic chemotherapy    History of basal cell carcinoma (BCC) excision    2019  s/p MOH's nose   History of GI bleed 05/2021   in setting post op cardiac cath PCI / stenting on blood thinnner/ 05-27-2021 Hg 7.9,  EGD done 05-30-2023  showed erosive gastrophy no active bleeding,  received x3 PRBC and IV iron  infusions   History of gout    History of kidney stones    History of palpitations    event monitor--- 11-21-2021  showed infrequent  NSVT, ST   History of radiation therapy    pallitive radition to right lung and left axilla 04-30-2018  to 05-21-2018;   enlarging early stage LUL mass SBRT  01-09-2022  to 01-19-2022   Hypertension    Hypothyroidism (acquired)    followed by pcp   IDA (iron  deficiency anemia)    Malignant neoplasm prostate [facility]) 01/2023   urologist--- dr gay/  radiation onologist--- dr patrcia;  dx 05/ 2024, gleason 4+3   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) [facility]) dx'd 04/17/18   to LN, infrahilar mass, lung nodule and lt axilla   Nephrolithiasis    per CT 02-21-2023 in epic bilateral renal calucli nonobstructive   OSA (obstructive sleep apnea)     sleep study 03-22-2021 in epic , mild osa AHI 8.2/hr  cpap intolerant   Port-A-Cath in place    Small bowel obstruction (HCC)    Squamous cell carcinoma of lung, stage IV, 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   oncologist--- dr ozzie  radiation onologist-- dr dewey;  dx by needle bx;  Non-small cell lung cancer SCC w/ large right infrahilar mass in addition left upper lobe nodule & left axilla mass w/ left axilla lymph node;  treated w/ pallitive radiation , systemic chemo, maintenance immunotherapy discontinued due to intolerence 03/ 2022    Medications: Medications Ordered Prior to Encounter[1]  Labs:     Latest Ref Rng & Units 05/25/2024   12:08 PM 05/21/2024    8:51 AM 05/20/2024    4:44 AM  CMP  Glucose 70 - 99 mg/dL 888  881  77   BUN 8 - 23 mg/dL 10  12  17    Creatinine 0.61 - 1.24 mg/dL 8.83  8.90  8.97   Sodium 135 - 145 mmol/L 141  142  146   Potassium 3.5 - 5.1 mmol/L 3.8  4.5  3.4   Chloride 98 - 111 mmol/L 109  107  109   CO2 22 - 32 mmol/L 26  23  22    Calcium 8.9 - 10.3 mg/dL 8.3  9.4  9.0   Total Protein 6.5 - 8.1 g/dL 6.4     Total Bilirubin 0.0 - 1.2 mg/dL 0.4     Alkaline Phos 38 - 126 U/L 60     AST 15 - 41 U/L 20     ALT 0 - 44 U/L 18      Lipid Panel     Component Value Date/Time   CHOL 124 09/19/2023 1027   TRIG 184 (H) 09/19/2023 1027   HDL 49 09/19/2023 1027   CHOLHDL 2.5 09/19/2023 1027   LDLCALC 45 09/19/2023 1027   LABVLDL 30 09/19/2023 1027   Lab Results  Component Value Date   HGBA1C 5.4 04/18/2022    Notable Signs/Symptoms: None  Lifestyle & Dietary Hx Widow; eats at home some and eats out.  Hello [employer] comes weekly -4-6 times per weekly  Estimated daily fluid intake: 32 oz, occassonal soda, wine Supplements: Iron  Sleep: 4-5 hrs Stress / self-care: None Current average weekly physical activity: 3-4 times per week. [employer]  24-Hr Dietary Recall Eats 3 meals per day B) oatmeal, fruit Trail mix, misc L) burger and fries, fruit, water  D) Hello  fresh meal Sometimes a snack  Estimated Energy Needs Calories: 1800 Carbohydrate: 200g Protein: 150g Fat: 56g   NUTRITION DIAGNOSIS  NB-1.1 Food and nutrition-related knowledge deficit As related to diet excessive in calories.  As evidenced by BMI 32.   NUTRITION INTERVENTION  Nutrition education (E-1) on the following topics:  Lifestyle Medicine  - Whole Food, Plant Predominant Nutrition is highly recommended: Eat Plenty of vegetables, Mushrooms, fruits, Legumes, Whole Grains, Nuts, seeds in lieu of processed meats, processed snacks/pastries red meat, poultry, eggs.    -It is better to avoid simple carbohydrates including: Cakes, Sweet Desserts, Ice Cream, Soda (diet and regular), Sweet Tea, Candies, Chips, Cookies, Store Bought Juices, Alcohol in Excess of  1-2 drinks a day, Lemonade,  Artificial Sweeteners, Doughnuts, Coffee Creamers, Sugar-free Products, etc, etc.  This is not a complete list.....  Exercise: If you are able: 30 -60 minutes a day ,4 days a week, or 150 minutes a week.  The longer the better.  Combine stretch, strength, and aerobic activities.  If you were told in the past that you have high risk for cardiovascular diseases, you may seek evaluation by your heart doctor prior to initiating moderate to intense exercise programs.    Handouts Provided Include  Lifestyle Medicine handouts   Learning Style & Readiness for Change Teaching method utilized: Visual & Auditory  Demonstrated degree of understanding via: Teach Back  Barriers to learning/adherence to lifestyle change: none  Goals Established by Pt Goals  Use MyFItnessPal APP Do the Full Plate Living Program Drink 4 bottles per day; increase to 5 bottles over time.- 128 oz per day. Keep working out. Look into a fitness coach at the [facility] Focus on more nutrient dense foods vs Calories Lose 1 lb per week. MONITORING & EVALUATION Dietary intake, weekly physical activity, and weight  in 1  month.  Next Steps  Patient is to work on meal planning and eating more whole plant based diet lifestyle..     [1]  Current Outpatient Medications on File Prior to Visit  Medication Sig Dispense Refill   acetaminophen  (TYLENOL ) 500 MG tablet Take 1,000 mg by mouth every 6 (six) hours as needed for moderate pain.     albuterol  (PROVENTIL ) (2.5 MG/3ML) 0.083% nebulizer solution Take 3 mLs (2.5 mg total) by nebulization every 6 (six) hours as needed for wheezing or shortness of breath. 75 mL 5   albuterol  (VENTOLIN  HFA) 108 (90 Base) MCG/ACT inhaler INHALE 2 PUFFS INTO THE LUNGS EVERY 6 HOURS AS NEEDED. 6.7 each 1   Alirocumab  (PRALUENT ) 150 MG/ML SOAJ INJECT 150 MG INTO THE SKIN EVERY 14 (FOURTEEN) DAYS. 6 mL 3   Artificial Tear Solution (SOOTHE XP OP) Place 1 drop into both eyes 2 (two) times daily.     azelastine  (ASTELIN ) 0.1 % nasal spray Place 1 spray into both nostrils 2 (two) times daily as needed for rhinitis (drainage). 30 mL 12   b complex vitamins capsule Take 1 capsule by mouth daily.     benzonatate  (TESSALON ) 200 MG capsule Take 1 capsule (200 mg total) by mouth 2 (two) times daily as needed for cough. 20 capsule 0   carvedilol  (COREG ) 25 MG tablet Take 1 tablet (25 mg total) by mouth 2 (two) times daily with a meal. 180 tablet 0   clopidogrel  (PLAVIX ) 75 MG tablet Take 1 tablet (75 mg total) by mouth daily. Pt needs to keep upcoming appt with provider in Jan, 2006 for additional refills 30 tablet 0   [facility] IRON  325 (65 Fe) MG tablet TAKE 1 TABLET BY MOUTH DAILY 90 tablet 1   desloratadine  (CLARINEX ) 5 MG tablet Take 1 tablet (5 mg total) by mouth daily as needed (drainage/ allergies). 90 tablet 3   levothyroxine  (SYNTHROID ) 137 MCG tablet  TAKE 1 TABLET BY MOUTH DAILY. *DOSE CHANGE. REPEAT LABS IN MAY 90 tablet 1   methocarbamol (ROBAXIN) 500 MG tablet Take 500 mg by mouth every 6 (six) hours as needed for muscle spasms.     Multiple Vitamin (MULTIVITAMIN) capsule Take 1 capsule by  mouth daily.     MYRBETRIQ 50 MG TB24 tablet Take 50 mg by mouth daily.     nitroGLYCERIN  (NITROSTAT ) 0.4 MG SL tablet Place 1 tablet (0.4 mg total) under the tongue every 5 (five) minutes as needed for chest pain. 25 tablet 2   ondansetron  (ZOFRAN -ODT) 4 MG disintegrating tablet Take 1 tablet (4 mg total) by mouth every 8 (eight) hours as needed for nausea or vomiting. 20 tablet 0   oxyCODONE  (ROXICODONE ) 5 MG immediate release tablet Take 0.5 tablets (2.5 mg total) by mouth every 6 (six) hours as needed for severe pain (pain score 7-10). 4 tablet 0   pantoprazole  (PROTONIX ) 40 MG tablet TAKE 1 TABLET BY MOUTH EVERY DAY BEFORE BREAKFAST 90 tablet 3   Polyethylene Glycol 3350  (MIRALAX PO) Take by mouth daily.     Probiotic Product (ALIGN PO) Take 1 tablet by mouth 2 (two) times daily. gummy     tamsulosin  (FLOMAX ) 0.4 MG CAPS capsule Take 0.4 mg by mouth at bedtime.     TRELEGY ELLIPTA  200-62.5-25 MCG/ACT AEPB INHALE 1 PUFF INTO THE LUNGS EVERY DAY 60 each 3   No current facility-administered medications on file prior to visit.

## 2024-09-21 NOTE — Patient Instructions (Addendum)
 Goals  Use MyFItnessPal APP Do the Full Plate Living Program Drink 4 bottles per day; increase to 5 bottles over time.- 128 oz per day. Keep working out. Look into a fitness coach at the [facility] Focus on more nutrient dense foods vs Calories Lose 1 lb per week.

## 2024-09-28 ENCOUNTER — Other Ambulatory Visit: Payer: Self-pay | Admitting: Cardiology

## 2024-09-30 ENCOUNTER — Other Ambulatory Visit: Payer: Self-pay | Admitting: Pulmonary Disease

## 2024-10-01 ENCOUNTER — Other Ambulatory Visit: Payer: Self-pay | Admitting: Cardiology

## 2024-10-07 NOTE — Telephone Encounter (Signed)
 Labs on 05/25/24 are out of Normal Range

## 2024-10-16 ENCOUNTER — Encounter (HOSPITAL_COMMUNITY): Payer: Self-pay

## 2024-10-19 ENCOUNTER — Ambulatory Visit: Admitting: Pulmonary Disease

## 2024-10-20 ENCOUNTER — Ambulatory Visit (HOSPITAL_COMMUNITY)

## 2024-11-02 ENCOUNTER — Ambulatory Visit: Admitting: Adult Health

## 2024-11-03 ENCOUNTER — Ambulatory Visit: Payer: Self-pay | Admitting: Family Medicine

## 2024-11-10 ENCOUNTER — Encounter: Admitting: Nutrition

## 2024-11-23 ENCOUNTER — Other Ambulatory Visit

## 2024-11-25 ENCOUNTER — Ambulatory Visit: Payer: Self-pay

## 2024-11-30 ENCOUNTER — Ambulatory Visit: Admitting: Internal Medicine
